# Patient Record
Sex: Male | Born: 1950 | Race: White | Hispanic: No | Marital: Married
Health system: Southern US, Community
[De-identification: ages and names within clinical notes are randomized; demographics above are authoritative.]

## PROBLEM LIST (undated history)

## (undated) DIAGNOSIS — R55 Syncope and collapse: Secondary | ICD-10-CM

## (undated) DIAGNOSIS — C801 Malignant (primary) neoplasm, unspecified: Secondary | ICD-10-CM

## (undated) DIAGNOSIS — W19XXXA Unspecified fall, initial encounter: Secondary | ICD-10-CM

## (undated) DIAGNOSIS — N400 Enlarged prostate without lower urinary tract symptoms: Secondary | ICD-10-CM

## (undated) DIAGNOSIS — R296 Repeated falls: Secondary | ICD-10-CM

## (undated) DIAGNOSIS — E119 Type 2 diabetes mellitus without complications: Secondary | ICD-10-CM

## (undated) DIAGNOSIS — R42 Dizziness and giddiness: Secondary | ICD-10-CM

## (undated) DIAGNOSIS — I2510 Atherosclerotic heart disease of native coronary artery without angina pectoris: Secondary | ICD-10-CM

## (undated) DIAGNOSIS — I2699 Other pulmonary embolism without acute cor pulmonale: Secondary | ICD-10-CM

## (undated) DIAGNOSIS — I82409 Acute embolism and thrombosis of unspecified deep veins of unspecified lower extremity: Secondary | ICD-10-CM

## (undated) HISTORY — DX: Other pulmonary embolism without acute cor pulmonale: I26.99

## (undated) HISTORY — DX: Syncope and collapse: R55

## (undated) HISTORY — DX: Repeated falls: R29.6

## (undated) HISTORY — DX: Atherosclerotic heart disease of native coronary artery without angina pectoris: I25.10

## (undated) HISTORY — DX: Benign prostatic hyperplasia without lower urinary tract symptoms: N40.0

## (undated) HISTORY — DX: Dizziness and giddiness: R42

---

## 2002-09-22 ENCOUNTER — Ambulatory Visit (HOSPITAL_COMMUNITY): Admission: RE | Admit: 2002-09-22 | Discharge: 2002-09-22 | Payer: Self-pay | Admitting: Gastroenterology

## 2002-11-24 ENCOUNTER — Encounter: Payer: Self-pay | Admitting: *Deleted

## 2002-11-24 ENCOUNTER — Ambulatory Visit (HOSPITAL_COMMUNITY): Admission: RE | Admit: 2002-11-24 | Discharge: 2002-11-24 | Payer: Self-pay | Admitting: *Deleted

## 2003-12-31 ENCOUNTER — Emergency Department (HOSPITAL_COMMUNITY): Admission: EM | Admit: 2003-12-31 | Discharge: 2003-12-31 | Payer: Self-pay | Admitting: Emergency Medicine

## 2004-01-31 ENCOUNTER — Emergency Department (HOSPITAL_COMMUNITY): Admission: EM | Admit: 2004-01-31 | Discharge: 2004-01-31 | Payer: Self-pay | Admitting: Emergency Medicine

## 2004-04-30 ENCOUNTER — Emergency Department (HOSPITAL_COMMUNITY): Admission: EM | Admit: 2004-04-30 | Discharge: 2004-04-30 | Payer: Self-pay | Admitting: Family Medicine

## 2004-08-27 ENCOUNTER — Emergency Department (HOSPITAL_COMMUNITY): Admission: EM | Admit: 2004-08-27 | Discharge: 2004-08-27 | Payer: Self-pay | Admitting: Emergency Medicine

## 2015-06-22 ENCOUNTER — Emergency Department (HOSPITAL_COMMUNITY)
Admission: EM | Admit: 2015-06-22 | Discharge: 2015-06-22 | Disposition: A | Discharge status: Home / Self Care | Payer: BLUE CROSS/BLUE SHIELD | Source: Home / Self Care | Attending: Family Medicine | Admitting: Family Medicine

## 2015-06-22 ENCOUNTER — Encounter (HOSPITAL_COMMUNITY): Payer: Self-pay | Admitting: *Deleted

## 2015-06-22 ENCOUNTER — Emergency Department (INDEPENDENT_AMBULATORY_CARE_PROVIDER_SITE_OTHER): Payer: BLUE CROSS/BLUE SHIELD

## 2015-06-22 DIAGNOSIS — S92301A Fracture of unspecified metatarsal bone(s), right foot, initial encounter for closed fracture: Secondary | ICD-10-CM

## 2015-06-22 DIAGNOSIS — S93401A Sprain of unspecified ligament of right ankle, initial encounter: Secondary | ICD-10-CM

## 2015-06-22 HISTORY — DX: Type 2 diabetes mellitus without complications: E11.9

## 2015-06-22 NOTE — ED Notes (Signed)
Pt  Reports   Pain   r      Foot      X  sev  Days  He  States  He  Rolled  It  Several  Days  Ago while going  Down some  Steps               He has  Pain       Bruising  Noted

## 2015-06-22 NOTE — ED Provider Notes (Addendum)
CSN: [id number]     Arrival date & time 06/22/15  1300 History   First MD Initiated Contact with Patient 06/22/15 1341     Chief Complaint  Patient presents with  . Foot Injury   (Consider location/radiation/quality/duration/timing/severity/associated sxs/prior Treatment) Patient is a 64 y.o. male presenting with foot injury. The history is provided by the patient.  Foot Injury Location:  Ankle and foot Time since incident:  3 days Injury: yes   Mechanism of injury: fall   Mechanism of injury comment:  [place] off last step of [place] at home on mon. Fall:    Entrapped after fall: no   Ankle location:  R ankle Foot location:  R foot Pain details:    Severity:  Moderate   Onset quality:  Gradual Chronicity:  New Dislocation: no   Prior injury to area:  No Relieved by:  Nothing Ineffective treatments:  Heat Associated symptoms: decreased ROM and numbness     Past Medical History  Diagnosis Date  . Diabetes mellitus without complication ([place])    History reviewed. No pertinent past surgical history. History reviewed. No pertinent family history. Social History  Substance Use Topics  . Smoking status: Current Every Day Smoker  . Smokeless tobacco: None  . Alcohol Use: No    Review of Systems  Musculoskeletal: Positive for joint swelling and gait problem. Negative for myalgias.  Skin: Negative.   All other systems reviewed and are negative.   Allergies  Review of patient's allergies indicates no known allergies.  Home Medications   Prior to Admission medications   Medication Sig Start Date End Date Taking? Authorizing Provider  Empagliflozin-Linagliptin (GLYXAMBI PO) Take by mouth.   Yes Historical Provider, MD   Meds Ordered and Administered this Visit  Medications - No data to display  BP 152/88 mmHg  Pulse 88  Temp(Src) 98.2 F (36.8 C) (Oral)  Resp 16  SpO2 96% No data found.   Physical Exam  Constitutional: He is oriented to person, place, and time. He  appears well-developed and well-nourished. No distress.  Musculoskeletal: He exhibits tenderness.       Right ankle: He exhibits decreased range of motion, swelling and ecchymosis. He exhibits normal pulse. Tenderness. Lateral malleolus and head of 5th metatarsal tenderness found. No proximal fibula tenderness found. Achilles tendon normal.       Right foot: There is tenderness, bony tenderness and swelling.  Neurological: He is alert and oriented to person, place, and time.  Skin: Skin is warm and dry.  Nursing note and vitals reviewed.   ED Course  Procedures (including critical care time)  Labs Review Labs Reviewed - No data to display  Imaging Review Dg Ankle Complete Right  06/22/2015   CLINICAL DATA:  Acute right ankle pain after injury on steps.  EXAM: RIGHT ANKLE - COMPLETE 3+ VIEW  COMPARISON:  None.  FINDINGS: There is no evidence of fracture, dislocation, or joint effusion. There is no evidence of arthropathy or other focal bone abnormality. Soft tissue swelling is seen over lateral malleolus.  IMPRESSION: No fracture or dislocation is noted. Soft tissue swelling is seen overlying lateral malleolus suggesting ligamentous injury.   Electronically Signed   By: Marijo Conception, M.D.   On: 06/22/2015 14:30   Dg Foot Complete Right  06/22/2015   CLINICAL DATA:  Acute right foot pain and swelling after injury on steps. Initial encounter.  EXAM: RIGHT FOOT COMPLETE - 3+ VIEW  COMPARISON:  November 07, 2012.  FINDINGS: Nondisplaced  fracture is seen involving the proximal base of the fifth metatarsal. This appears to be closed and posttraumatic. Spurring of posterior calcaneus is noted. 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  IMPRESSION: Nondisplaced fracture involving the proximal fifth metatarsal.   Electronically Signed   By: Marijo Conception, M.D.   On: 06/22/2015 14:25    X-rays reviewed and report per radiologist.  Visual Acuity  Review  Right Eye Distance:   Left Eye Distance:   Bilateral Distance:    Right Eye Near:   Left Eye Near:    Bilateral Near:         MDM   1. Sprain of ankle, right, initial encounter   2. Metatarsal fracture, right, closed, initial encounter    Cam walker    Billy Fischer, MD 06/22/15 [place], MD 06/22/15 [phone]

## 2015-06-22 NOTE — Discharge Instructions (Signed)
Ice, elevate and ibuprofen as needed,minimize activity as possible.

## 2015-07-13 NOTE — ED Notes (Signed)
Dr  Juventino Slovak  Notified  Of   Request    For  Work  Excuse      Gave  Instructions   For  Pt  To  Be excused     From  The  5t  Of  Oct  Until  13  Oct  But   Advised   fmla     Papers     Should  Be  Filled  Out  By  The  Orthopedist

## 2017-03-06 DIAGNOSIS — E1140 Type 2 diabetes mellitus with diabetic neuropathy, unspecified: Secondary | ICD-10-CM | POA: Diagnosis not present

## 2017-03-06 DIAGNOSIS — Z1389 Encounter for screening for other disorder: Secondary | ICD-10-CM | POA: Diagnosis not present

## 2017-03-06 DIAGNOSIS — E784 Other hyperlipidemia: Secondary | ICD-10-CM | POA: Diagnosis not present

## 2017-03-06 DIAGNOSIS — Z6823 Body mass index (BMI) 23.0-23.9, adult: Secondary | ICD-10-CM | POA: Diagnosis not present

## 2017-03-06 DIAGNOSIS — E1142 Type 2 diabetes mellitus with diabetic polyneuropathy: Secondary | ICD-10-CM | POA: Diagnosis not present

## 2017-07-10 DIAGNOSIS — E1142 Type 2 diabetes mellitus with diabetic polyneuropathy: Secondary | ICD-10-CM | POA: Diagnosis not present

## 2017-07-10 DIAGNOSIS — R008 Other abnormalities of heart beat: Secondary | ICD-10-CM | POA: Diagnosis not present

## 2017-07-10 DIAGNOSIS — E1149 Type 2 diabetes mellitus with other diabetic neurological complication: Secondary | ICD-10-CM | POA: Diagnosis not present

## 2017-07-10 DIAGNOSIS — Z6824 Body mass index (BMI) 24.0-24.9, adult: Secondary | ICD-10-CM | POA: Diagnosis not present

## 2017-07-10 DIAGNOSIS — E7849 Other hyperlipidemia: Secondary | ICD-10-CM | POA: Diagnosis not present

## 2017-07-12 DIAGNOSIS — Z23 Encounter for immunization: Secondary | ICD-10-CM | POA: Diagnosis not present

## 2018-01-08 DIAGNOSIS — E7849 Other hyperlipidemia: Secondary | ICD-10-CM | POA: Diagnosis not present

## 2018-01-08 DIAGNOSIS — E1142 Type 2 diabetes mellitus with diabetic polyneuropathy: Secondary | ICD-10-CM | POA: Diagnosis not present

## 2018-01-08 DIAGNOSIS — E1149 Type 2 diabetes mellitus with other diabetic neurological complication: Secondary | ICD-10-CM | POA: Diagnosis not present

## 2018-01-08 DIAGNOSIS — Z6823 Body mass index (BMI) 23.0-23.9, adult: Secondary | ICD-10-CM | POA: Diagnosis not present

## 2018-01-08 DIAGNOSIS — E1140 Type 2 diabetes mellitus with diabetic neuropathy, unspecified: Secondary | ICD-10-CM | POA: Diagnosis not present

## 2018-03-17 ENCOUNTER — Other Ambulatory Visit: Payer: Self-pay

## 2018-03-17 ENCOUNTER — Emergency Department (HOSPITAL_COMMUNITY)
Admission: EM | Admit: 2018-03-17 | Discharge: 2018-03-18 | Disposition: A | Discharge status: Home / Self Care | Payer: Medicare HMO | Attending: Emergency Medicine | Admitting: Emergency Medicine

## 2018-03-17 DIAGNOSIS — M7989 Other specified soft tissue disorders: Secondary | ICD-10-CM | POA: Diagnosis not present

## 2018-03-17 DIAGNOSIS — E119 Type 2 diabetes mellitus without complications: Secondary | ICD-10-CM | POA: Diagnosis not present

## 2018-03-17 DIAGNOSIS — Z7984 Long term (current) use of oral hypoglycemic drugs: Secondary | ICD-10-CM | POA: Diagnosis not present

## 2018-03-17 DIAGNOSIS — F17210 Nicotine dependence, cigarettes, uncomplicated: Secondary | ICD-10-CM | POA: Diagnosis not present

## 2018-03-17 DIAGNOSIS — I824Y1 Acute embolism and thrombosis of unspecified deep veins of right proximal lower extremity: Secondary | ICD-10-CM | POA: Diagnosis not present

## 2018-03-17 LAB — BASIC METABOLIC PANEL
Anion gap: 10 (ref 5–15)
BUN: 22 mg/dL (ref 8–23)
CO2: 27 mmol/L (ref 22–32)
Calcium: 9.5 mg/dL (ref 8.9–10.3)
Chloride: 100 mmol/L (ref 98–111)
Creatinine, Ser: 1.14 mg/dL (ref 0.61–1.24)
GFR calc Af Amer: >60 mL/min (ref >60)
GFR calc non Af Amer: >60 mL/min (ref >60)
Glucose, Bld: 213 mg/dL — ABNORMAL HIGH (ref 70–99)
Potassium: 4.4 mmol/L (ref 3.5–5.1)
Sodium: 137 mmol/L (ref 135–145)

## 2018-03-17 LAB — CBC WITH DIFFERENTIAL/PLATELET
Abs Immature Granulocytes: 0 10*3/uL (ref 0.0–0.1)
Basophils Absolute: 0.1 10*3/uL (ref 0.0–0.1)
Basophils Relative: 1 %
Eosinophils Absolute: 0.3 10*3/uL (ref 0.0–0.7)
Eosinophils Relative: 3 %
HCT: 39.7 % (ref 39.0–52.0)
Hemoglobin: 12.8 g/dL — ABNORMAL LOW (ref 13.0–17.0)
Immature Granulocytes: 0 %
Lymphocytes Relative: 23 %
Lymphs Abs: 2.2 10*3/uL (ref 0.7–4.0)
MCH: 31.1 pg (ref 26.0–34.0)
MCHC: 32.2 g/dL (ref 30.0–36.0)
MCV: 96.6 fL (ref 78.0–100.0)
Monocytes Absolute: 0.6 10*3/uL (ref 0.1–1.0)
Monocytes Relative: 6 %
Neutro Abs: 6.3 10*3/uL (ref 1.7–7.7)
Neutrophils Relative %: 67 %
Platelets: 168 10*3/uL (ref 150–400)
RBC: 4.11 MIL/uL — ABNORMAL LOW (ref 4.22–5.81)
RDW: 12.4 % (ref 11.5–15.5)
WBC: 9.5 10*3/uL (ref 4.0–10.5)

## 2018-03-17 NOTE — ED Triage Notes (Signed)
Pt c/o RLE and foot swelling two weeks ago.

## 2018-03-18 ENCOUNTER — Emergency Department (HOSPITAL_COMMUNITY)
Admission: EM | Admit: 2018-03-18 | Discharge: 2018-03-18 | Disposition: A | Discharge status: Home / Self Care | Payer: Medicare HMO | Source: Home / Self Care | Attending: Emergency Medicine | Admitting: Emergency Medicine

## 2018-03-18 ENCOUNTER — Encounter (HOSPITAL_COMMUNITY): Payer: Self-pay | Admitting: *Deleted

## 2018-03-18 ENCOUNTER — Ambulatory Visit (HOSPITAL_BASED_OUTPATIENT_CLINIC_OR_DEPARTMENT_OTHER)
Admission: RE | Admit: 2018-03-18 | Discharge: 2018-03-18 | Disposition: A | Discharge status: Home / Self Care | Payer: Medicare HMO | Source: Ambulatory Visit | Attending: Emergency Medicine | Admitting: Emergency Medicine

## 2018-03-18 ENCOUNTER — Other Ambulatory Visit: Payer: Self-pay

## 2018-03-18 DIAGNOSIS — I82431 Acute embolism and thrombosis of right popliteal vein: Secondary | ICD-10-CM

## 2018-03-18 DIAGNOSIS — E119 Type 2 diabetes mellitus without complications: Secondary | ICD-10-CM | POA: Insufficient documentation

## 2018-03-18 DIAGNOSIS — I82411 Acute embolism and thrombosis of right femoral vein: Secondary | ICD-10-CM

## 2018-03-18 DIAGNOSIS — M7989 Other specified soft tissue disorders: Secondary | ICD-10-CM

## 2018-03-18 DIAGNOSIS — I825Y1 Chronic embolism and thrombosis of unspecified deep veins of right proximal lower extremity: Secondary | ICD-10-CM | POA: Insufficient documentation

## 2018-03-18 DIAGNOSIS — I82441 Acute embolism and thrombosis of right tibial vein: Secondary | ICD-10-CM | POA: Insufficient documentation

## 2018-03-18 DIAGNOSIS — F17210 Nicotine dependence, cigarettes, uncomplicated: Secondary | ICD-10-CM | POA: Insufficient documentation

## 2018-03-18 DIAGNOSIS — I824Y1 Acute embolism and thrombosis of unspecified deep veins of right proximal lower extremity: Secondary | ICD-10-CM | POA: Diagnosis not present

## 2018-03-18 LAB — CBG MONITORING, ED: Glucose-Capillary: 200 mg/dL — ABNORMAL HIGH (ref 70–99)

## 2018-03-18 MED ORDER — ENOXAPARIN SODIUM 150 MG/ML ~~LOC~~ SOLN
1.5000 mg/kg | Freq: Once | SUBCUTANEOUS | Status: AC
  Administered 2018-03-18: 135 mg via SUBCUTANEOUS
  Filled 2018-03-18: qty 0.91

## 2018-03-18 MED ORDER — DOXYCYCLINE HYCLATE 100 MG PO CAPS: 100.0000 mg | ORAL_CAPSULE | Freq: Two times a day (BID) | ORAL | 0 refills | Status: DC

## 2018-03-18 MED ORDER — DOXYCYCLINE HYCLATE 100 MG PO TABS
100.0000 mg | ORAL_TABLET | Freq: Once | ORAL | Status: AC
  Administered 2018-03-18: 100 mg via ORAL
  Filled 2018-03-18: qty 1

## 2018-03-18 MED ORDER — RIVAROXABAN 15 MG PO TABS
15.0000 mg | ORAL_TABLET | Freq: Once | ORAL | Status: AC
  Administered 2018-03-18: 15 mg via ORAL
  Filled 2018-03-18: qty 1

## 2018-03-18 MED ORDER — RIVAROXABAN (XARELTO) VTE STARTER PACK (15 & 20 MG): ORAL_TABLET | ORAL | 0 refills | Status: DC

## 2018-03-18 NOTE — ED Triage Notes (Signed)
PT reports a couple weeks ago he hurt his Rt ankle unsure how injury occurred. Pt reports he now has pain in RFtcalf that hurts worse when walking.

## 2018-03-18 NOTE — Discharge Instructions (Signed)
Take the medications as prescribed (you will end up needing to switch from twice a day to once a day, this will be written on the prescription), follow-up with your primary care doctor in the next week or so, return for signs of bleeding or other complications

## 2018-03-18 NOTE — ED Provider Notes (Signed)
[facility] EMERGENCY DEPARTMENT Provider Note   CSN: [id number] Arrival date & time: 03/17/18  2115    History   Chief Complaint Chief Complaint  Patient presents with  . Leg Swelling    R leg only    HPI Micheal Moore. is a 67 y.o. male.  67 year old male with history of diabetes presents to the emergency department for complaints of right lower extremity swelling.  Triage note reports swelling onset 2 weeks ago, but patient states this actually occurred more recently.  Swelling has been gradual, but worsening since yesterday.  He denies any known trauma or injury to his leg.  No prior history of DVT.  He has 2 abrasions to the top of his shin, but states these were sustained after the swelling began.  He has not had any fevers, chest pain, shortness of breath, hemoptysis.  No recent surgeries or hospitalizations.  He denies taking any medications for his symptoms.  Symptoms with a slight discomfort just below his medial right knee, mostly with ambulation.     Past Medical History:  Diagnosis Date  . Diabetes mellitus without complication ([place])     There are no active problems to display for this patient.   No past surgical history on file.      Home Medications    Prior to Admission medications   Medication Sig Start Date End Date Taking? Authorizing Provider  doxycycline (VIBRAMYCIN) 100 MG capsule Take 1 capsule (100 mg total) by mouth 2 (two) times daily. 03/18/18   Antonietta Breach, PA-C  Empagliflozin-Linagliptin (GLYXAMBI PO) Take by mouth.    [provider]    Family History No family history on file.  Social History Social History   Tobacco Use  . Smoking status: Current Every Day Smoker  Substance Use Topics  . Alcohol use: No  . Drug use: Not on file     Allergies   Patient has no known allergies.   Review of Systems Review of Systems Ten systems reviewed and are negative for acute change, except as noted in  the HPI.    Physical Exam Updated Vital Signs BP (!) 157/90 (BP Location: Right Arm)   Pulse 97   Temp 98.6 F (37 C) (Oral)   Resp 12   Ht 6\' 5"  (1.956 m)   Wt 90.7 kg (200 lb)   SpO2 100%   BMI 23.72 kg/m   Physical Exam  Constitutional: He is oriented to person, place, and time. He appears well-developed and well-nourished. No distress.  Nontoxic appearing and in NAD  HENT:  Head: Normocephalic and atraumatic.  Eyes: Conjunctivae and EOM are normal. No scleral icterus.  Neck: Normal range of motion.  Cardiovascular: Normal rate, regular rhythm and intact distal pulses.  DP pulse 2+ in the RLE  Pulmonary/Chest: Effort normal. No respiratory distress.  Respirations even and unlabored  Musculoskeletal: Normal range of motion.  Swelling to the RLE compared to left; increasingly erythematous with mild heat to touch. Two abrasion overlying R shin. Compartments soft. There is TTP to the medial aspect of the RLE, just distal to the R knee joint. No palpable cords. No lymphangitic streaking.  Normal ROM of the RLE.  Neurological: He is alert and oriented to person, place, and time. He exhibits normal muscle tone. Coordination normal.  Skin: Skin is warm and dry. No rash noted. He is not diaphoretic. No pallor.  Psychiatric: He has a normal mood and affect. His behavior is normal.  Nursing note and vitals reviewed.        ED Treatments / Results  Labs (all labs ordered are listed, but only abnormal results are displayed) Labs Reviewed  CBC WITH DIFFERENTIAL/PLATELET - Abnormal; Notable for the following components:      Result Value   RBC 4.11 (*)    Hemoglobin 12.8 (*)    All other components within normal limits  BASIC METABOLIC PANEL - Abnormal; Notable for the following components:   Glucose, Bld 213 (*)    All other components within normal limits    EKG None  Radiology No results found.  Procedures Procedures (including critical care time)  Medications  Ordered in ED Medications  enoxaparin (LOVENOX) injection 135 mg (135 mg Subcutaneous Given 03/18/18 0118)  doxycycline (VIBRA-TABS) tablet 100 mg (100 mg Oral Given 03/18/18 0053)     Initial Impression / Assessment and Plan / ED Course  I have reviewed the triage vital signs and the nursing notes.  Pertinent labs & imaging results that were available during my care of the patient were reviewed by me and considered in my medical decision making (see chart for details).     67 year old male presents for swelling to the right lower extremity.  There is concern for DVT versus cellulitis.  The patient is neurovascularly intact.  He has no leukocytosis or fever.  No lymphangitic streaking.  Cellulitis thought less likely, but will start on doxycycline and provide prescription for antibiotic course should venous duplex not reveal DVT.  The patient has been given a dose of Lovenox in the ED.  Patient instructed to return for ultrasound in the morning.  Compartments are soft and no history of trauma.  He has been weightbearing without difficulty.  No concern for fracture.  Return precautions discussed and provided. Patient discharged in stable condition with no unaddressed concerns.   Final Clinical Impressions(s) / ED Diagnoses   Final diagnoses:  Swelling of right lower extremity    ED Discharge Orders        Ordered    doxycycline (VIBRAMYCIN) 100 MG capsule  2 times daily     03/18/18 0107    LE VENOUS     03/18/18 0107       Antonietta Breach, PA-C 03/18/18 0422    Merryl Hacker, MD 03/19/18 [phone]

## 2018-03-18 NOTE — Progress Notes (Signed)
Right lower extremity venous duplex completed. Positive for and extensive acute DVT coursing from the posterior tibial vein through the popliteal and femoral veins. There is no evidence of a baker's cyst. Toma Copier, RVS 03/18/2018 9:03 AM

## 2018-03-18 NOTE — Discharge Instructions (Signed)
Return in the morning to have an ultrasound of your leg completed to evaluate for blood clot.  If you have a blood clot in your leg, you will need to be started on a blood thinner.  If your ultrasound is negative, your swelling is likely from infection.  If you do not have a blood clot in your leg, start taking doxycycline.  Take this medication as prescribed until finished.  Continue to elevate your leg as much as possible, specifically over the level of your heart.  Follow-up with your primary care doctor to ensure resolution of symptoms.  Return if symptoms persist or worsen.

## 2018-03-18 NOTE — ED Notes (Signed)
Discharge instructions and prescriptions discussed with Pt. Pt verbalized understanding. Pt stable and ambulatory.   

## 2018-03-18 NOTE — ED Provider Notes (Signed)
[facility] EMERGENCY DEPARTMENT Provider Note   CSN: [id number] Arrival date & time: 03/18/18  1779     History   Chief Complaint Chief Complaint  Patient presents with  . Leg Pain    HPI Micheal Moore. is a 67 y.o. male.  HPI Pt has been having pain in his calf for at least the past week.  He was seen in the ED last night and scheduled for an [place] this am.   Pt had his [place] and was sent back to the ED because it was positive. He denies any chest pain or shortness of breath.  No bleeding problems.  No other complaints or concerns. Past Medical History:  Diagnosis Date  . Diabetes mellitus without complication ([place])     There are no active problems to display for this patient.   History reviewed. No pertinent surgical history.      Home Medications    Prior to Admission medications   Medication Sig Start Date End Date Taking? Authorizing Provider  doxycycline (VIBRAMYCIN) 100 MG capsule Take 1 capsule (100 mg total) by mouth 2 (two) times daily. 03/18/18   Antonietta Breach, PA-C  Empagliflozin-Linagliptin (GLYXAMBI PO) Take by mouth.    [provider]  Rivaroxaban 15 & 20 MG TBPK Take as directed on package: Start with one 15mg  tablet by mouth twice a day with food. On Day 22, switch to one 20mg  tablet once a day with food. 03/18/18   Dorie Rank, MD    Family History History reviewed. No pertinent family history.  Social History Social History   Tobacco Use  . Smoking status: Current Every Day Smoker  . Smokeless tobacco: Never Used  Substance Use Topics  . Alcohol use: No  . Drug use: Not on file     Allergies   Patient has no known allergies.   Review of Systems Review of Systems  All other systems reviewed and are negative.    Physical Exam Updated Vital Signs BP (!) 154/85   Pulse (!) 101   Temp 98.8 F (37.1 C) (Oral)   Resp 16   Ht 1.956 m (6\' 5" )   Wt 90.7 kg (200 lb)   SpO2 96%   BMI 23.72 kg/m    Physical Exam  Constitutional: He appears well-developed and well-nourished. No distress.  HENT:  Head: Normocephalic and atraumatic.  Right Ear: External ear normal.  Left Ear: External ear normal.  Eyes: Conjunctivae are normal. Right eye exhibits no discharge. Left eye exhibits no discharge. No scleral icterus.  Neck: Neck supple. No tracheal deviation present.  Cardiovascular: Normal rate.  Pulmonary/Chest: Effort normal. No stridor. No respiratory distress.  Abdominal: He exhibits no distension.  Musculoskeletal: He exhibits edema and tenderness.  Erythema and edema of the right lower extremity  Neurological: He is alert. Cranial nerve deficit: no gross deficits.  Skin: Skin is warm and dry. No rash noted.  Psychiatric: He has a normal mood and affect.  Nursing note and vitals reviewed.    ED Treatments / Results  Labs (all labs ordered are listed, but only abnormal results are displayed) Labs Reviewed  CBG MONITORING, ED - Abnormal; Notable for the following components:      Result Value   Glucose-Capillary 200 (*)    All other components within normal limits     Procedures Procedures (including critical care time)  Medications Ordered in ED Medications  Rivaroxaban (XARELTO) tablet 15 mg (has no administration in time range)  Initial Impression / Assessment and Plan / ED Course  I have reviewed the triage vital signs and the nursing notes.  Pertinent labs & imaging results that were available during my care of the patient were reviewed by me and considered in my medical decision making (see chart for details).   Renal function checked last night.  Cr is 1.14.  CBC normal.    Patient presented to the emergency room for treatment of a DVT noted on an outpatient ultrasound ordered by ED physicians last evening.  Patient's ultrasound is positive for an acute DVT.  Patient denies any trouble with chest pain or shortness of breath.  His renal function is normal.   We discussed outpatient treatment with Xarelto and the need to follow-up with his primary care doctor.  No clear indication of why the patient sustained a DVT although he does have a history of a prior injury to that leg.  Final Clinical Impressions(s) / ED Diagnoses   Final diagnoses:  Acute deep vein thrombosis (DVT) of proximal vein of right lower extremity [facility])    ED Discharge Orders        Ordered    Rivaroxaban 15 & 20 MG TBPK     03/18/18 0945       Dorie Rank, MD 03/18/18 0945

## 2018-03-18 NOTE — ED Notes (Addendum)
Pt reports stabbing pain behind right knee when walking that subsides when sitting or lying down. Denies SOB.

## 2018-03-18 NOTE — ED Notes (Signed)
Pt's cbg was 200, informed Rod Holler- Therapist, sports.

## 2018-03-26 DIAGNOSIS — E1140 Type 2 diabetes mellitus with diabetic neuropathy, unspecified: Secondary | ICD-10-CM | POA: Diagnosis not present

## 2018-03-26 DIAGNOSIS — I82401 Acute embolism and thrombosis of unspecified deep veins of right lower extremity: Secondary | ICD-10-CM | POA: Diagnosis not present

## 2018-03-26 DIAGNOSIS — Z6823 Body mass index (BMI) 23.0-23.9, adult: Secondary | ICD-10-CM | POA: Diagnosis not present

## 2018-06-04 DIAGNOSIS — H52209 Unspecified astigmatism, unspecified eye: Secondary | ICD-10-CM | POA: Diagnosis not present

## 2018-06-04 DIAGNOSIS — H524 Presbyopia: Secondary | ICD-10-CM | POA: Diagnosis not present

## 2018-06-04 DIAGNOSIS — H5213 Myopia, bilateral: Secondary | ICD-10-CM | POA: Diagnosis not present

## 2018-08-12 DIAGNOSIS — E1142 Type 2 diabetes mellitus with diabetic polyneuropathy: Secondary | ICD-10-CM | POA: Diagnosis not present

## 2018-08-12 DIAGNOSIS — L03116 Cellulitis of left lower limb: Secondary | ICD-10-CM | POA: Diagnosis not present

## 2018-08-12 DIAGNOSIS — E7849 Other hyperlipidemia: Secondary | ICD-10-CM | POA: Diagnosis not present

## 2018-08-12 DIAGNOSIS — I87009 Postthrombotic syndrome without complications of unspecified extremity: Secondary | ICD-10-CM | POA: Diagnosis not present

## 2018-08-12 DIAGNOSIS — E1149 Type 2 diabetes mellitus with other diabetic neurological complication: Secondary | ICD-10-CM | POA: Diagnosis not present

## 2018-08-12 DIAGNOSIS — E1140 Type 2 diabetes mellitus with diabetic neuropathy, unspecified: Secondary | ICD-10-CM | POA: Diagnosis not present

## 2018-08-12 DIAGNOSIS — Z6824 Body mass index (BMI) 24.0-24.9, adult: Secondary | ICD-10-CM | POA: Diagnosis not present

## 2018-08-12 DIAGNOSIS — Z23 Encounter for immunization: Secondary | ICD-10-CM | POA: Diagnosis not present

## 2018-12-18 DIAGNOSIS — E1149 Type 2 diabetes mellitus with other diabetic neurological complication: Secondary | ICD-10-CM | POA: Diagnosis not present

## 2018-12-18 DIAGNOSIS — E1142 Type 2 diabetes mellitus with diabetic polyneuropathy: Secondary | ICD-10-CM | POA: Diagnosis not present

## 2018-12-18 DIAGNOSIS — I82401 Acute embolism and thrombosis of unspecified deep veins of right lower extremity: Secondary | ICD-10-CM | POA: Diagnosis not present

## 2018-12-18 DIAGNOSIS — E785 Hyperlipidemia, unspecified: Secondary | ICD-10-CM | POA: Diagnosis not present

## 2019-03-22 ENCOUNTER — Other Ambulatory Visit: Payer: Self-pay

## 2019-03-22 ENCOUNTER — Emergency Department (HOSPITAL_COMMUNITY)
Admission: EM | Admit: 2019-03-22 | Discharge: 2019-03-23 | Disposition: A | Discharge status: Home / Self Care | Payer: Medicare HMO | Attending: Emergency Medicine | Admitting: Emergency Medicine

## 2019-03-22 ENCOUNTER — Emergency Department (HOSPITAL_COMMUNITY): Payer: Medicare HMO

## 2019-03-22 ENCOUNTER — Encounter (HOSPITAL_COMMUNITY): Payer: Self-pay | Admitting: *Deleted

## 2019-03-22 DIAGNOSIS — R410 Disorientation, unspecified: Secondary | ICD-10-CM | POA: Diagnosis not present

## 2019-03-22 DIAGNOSIS — E860 Dehydration: Secondary | ICD-10-CM | POA: Insufficient documentation

## 2019-03-22 DIAGNOSIS — Z79899 Other long term (current) drug therapy: Secondary | ICD-10-CM | POA: Diagnosis not present

## 2019-03-22 DIAGNOSIS — S59902A Unspecified injury of left elbow, initial encounter: Secondary | ICD-10-CM | POA: Diagnosis not present

## 2019-03-22 DIAGNOSIS — Z7984 Long term (current) use of oral hypoglycemic drugs: Secondary | ICD-10-CM | POA: Insufficient documentation

## 2019-03-22 DIAGNOSIS — S40812A Abrasion of left upper arm, initial encounter: Secondary | ICD-10-CM | POA: Diagnosis not present

## 2019-03-22 DIAGNOSIS — W1830XA Fall on same level, unspecified, initial encounter: Secondary | ICD-10-CM | POA: Insufficient documentation

## 2019-03-22 DIAGNOSIS — F17210 Nicotine dependence, cigarettes, uncomplicated: Secondary | ICD-10-CM | POA: Insufficient documentation

## 2019-03-22 DIAGNOSIS — Y9389 Activity, other specified: Secondary | ICD-10-CM | POA: Insufficient documentation

## 2019-03-22 DIAGNOSIS — S299XXA Unspecified injury of thorax, initial encounter: Secondary | ICD-10-CM | POA: Diagnosis not present

## 2019-03-22 DIAGNOSIS — Y92410 Unspecified street and highway as the place of occurrence of the external cause: Secondary | ICD-10-CM | POA: Diagnosis not present

## 2019-03-22 DIAGNOSIS — R000 Tachycardia, unspecified: Secondary | ICD-10-CM | POA: Diagnosis not present

## 2019-03-22 DIAGNOSIS — E119 Type 2 diabetes mellitus without complications: Secondary | ICD-10-CM | POA: Diagnosis not present

## 2019-03-22 DIAGNOSIS — Z23 Encounter for immunization: Secondary | ICD-10-CM | POA: Diagnosis not present

## 2019-03-22 DIAGNOSIS — R42 Dizziness and giddiness: Secondary | ICD-10-CM | POA: Diagnosis not present

## 2019-03-22 DIAGNOSIS — S0003XA Contusion of scalp, initial encounter: Secondary | ICD-10-CM | POA: Diagnosis not present

## 2019-03-22 DIAGNOSIS — R55 Syncope and collapse: Secondary | ICD-10-CM | POA: Insufficient documentation

## 2019-03-22 DIAGNOSIS — E1165 Type 2 diabetes mellitus with hyperglycemia: Secondary | ICD-10-CM | POA: Diagnosis not present

## 2019-03-22 DIAGNOSIS — Y999 Unspecified external cause status: Secondary | ICD-10-CM | POA: Diagnosis not present

## 2019-03-22 DIAGNOSIS — R0902 Hypoxemia: Secondary | ICD-10-CM | POA: Diagnosis not present

## 2019-03-22 LAB — COMPREHENSIVE METABOLIC PANEL
ALT: 16 U/L (ref 0–44)
AST: 23 U/L (ref 15–41)
Albumin: 4.2 g/dL (ref 3.5–5.0)
Alkaline Phosphatase: 63 U/L (ref 38–126)
Anion gap: 14 (ref 5–15)
BUN: 21 mg/dL (ref 8–23)
CO2: 16 mmol/L — ABNORMAL LOW (ref 22–32)
Calcium: 9.7 mg/dL (ref 8.9–10.3)
Chloride: 107 mmol/L (ref 98–111)
Creatinine, Ser: 1.7 mg/dL — ABNORMAL HIGH (ref 0.61–1.24)
GFR calc Af Amer: 47 mL/min — ABNORMAL LOW (ref >60)
GFR calc non Af Amer: 41 mL/min — ABNORMAL LOW (ref >60)
Glucose, Bld: 293 mg/dL — ABNORMAL HIGH (ref 70–99)
Potassium: 5 mmol/L (ref 3.5–5.1)
Sodium: 137 mmol/L (ref 135–145)
Total Bilirubin: 1.1 mg/dL (ref 0.3–1.2)
Total Protein: 7.4 g/dL (ref 6.5–8.1)

## 2019-03-22 LAB — CBC WITH DIFFERENTIAL/PLATELET
Abs Immature Granulocytes: 0.04 10*3/uL (ref 0.00–0.07)
Basophils Absolute: 0.1 10*3/uL (ref 0.0–0.1)
Basophils Relative: 1 %
Eosinophils Absolute: 0.1 10*3/uL (ref 0.0–0.5)
Eosinophils Relative: 1 %
HCT: 42.5 % (ref 39.0–52.0)
Hemoglobin: 14.3 g/dL (ref 13.0–17.0)
Immature Granulocytes: 1 %
Lymphocytes Relative: 22 %
Lymphs Abs: 1.6 10*3/uL (ref 0.7–4.0)
MCH: 32 pg (ref 26.0–34.0)
MCHC: 33.6 g/dL (ref 30.0–36.0)
MCV: 95.1 fL (ref 80.0–100.0)
Monocytes Absolute: 0.3 10*3/uL (ref 0.1–1.0)
Monocytes Relative: 4 %
Neutro Abs: 5 10*3/uL (ref 1.7–7.7)
Neutrophils Relative %: 71 %
Platelets: 239 10*3/uL (ref 150–400)
RBC: 4.47 MIL/uL (ref 4.22–5.81)
RDW: 12.7 % (ref 11.5–15.5)
WBC: 7.1 10*3/uL (ref 4.0–10.5)
nRBC: 0 % (ref 0.0–0.2)

## 2019-03-22 LAB — D-DIMER, QUANTITATIVE: D-Dimer, Quant: 0.64 ug/mL-FEU — ABNORMAL HIGH (ref 0.00–0.50)

## 2019-03-22 LAB — CK: Total CK: 613 U/L — ABNORMAL HIGH (ref 49–397)

## 2019-03-22 LAB — LACTIC ACID, PLASMA
Lactic Acid, Venous: 4.9 mmol/L (ref 0.5–1.9)
Lactic Acid, Venous: 1.6 mmol/L (ref 0.5–1.9)

## 2019-03-22 LAB — CBG MONITORING, ED: Glucose-Capillary: 269 mg/dL — ABNORMAL HIGH (ref 70–99)

## 2019-03-22 MED ORDER — SODIUM CHLORIDE 0.9 % IV BOLUS
1000.0000 mL | Freq: Once | INTRAVENOUS | Status: AC
  Administered 2019-03-22: 1000 mL via INTRAVENOUS

## 2019-03-22 MED ORDER — TETANUS-DIPHTH-ACELL PERTUSSIS 5-2.5-18.5 LF-MCG/0.5 IM SUSP
0.5000 mL | Freq: Once | INTRAMUSCULAR | Status: AC
  Administered 2019-03-22: 0.5 mL via INTRAMUSCULAR
  Filled 2019-03-22: qty 0.5

## 2019-03-22 NOTE — Discharge Instructions (Addendum)
You were seen in the ER for near passing out episode  We suspect your symptoms are from exposure to heat, dehydration, not eating.   We gave you IV fluids and you felt better  Stay hydrated. Eat small healthy meals every 3-4 hours.   Return immediately to ER for light headedness, feeling faint, passing out, chest pain, shortness of breath, palpitations

## 2019-03-22 NOTE — ED Provider Notes (Signed)
[facility] EMERGENCY DEPARTMENT Provider Note   CSN: [id number] Arrival date & time: 03/22/19  1718    History   Chief Complaint Chief Complaint  Patient presents with   Fall    HPI Micheal Moore. is a 68 y.o. male with history of diabetes on oral agents, DVT, tobacco use brought to the ER by EMS for evaluation after a fall.  Per EMS report patient was found sitting down on the ground and he reported feeling dizzy.  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  Usually he does it in the morning but today he started at 3 pm. He had been doing this in the heat for approximately 1.5 hour.  Remembers prodromal fatigue, generalized weakness and remembers "going down" falling to the ground.  He braced his fall with his left upper extremity.  There was left forehead trauma with an abrasion.  On route to the ER he vomited once.  He denies LOC.  He denies any headache, vision changes or loss, neck pain.  He denies anticoagulant use.  Currently reports mild intermittent dizziness, feeling tired, hot.  He drank some water but cannot tell me how much before starting work.  He has not eaten anything today.  Temperature 100.3 and pulse rate 120 per EMS.  No interventions.  No modifying factors. States he didn't finish Xarelto 1 year ago after being diagnosed with DVT because of cost, now just taking aspirin.   He denies any recent infectious illnesses.  He denies any recent fevers, congestion, rhinorrhea, sore throat, cough, abdominal pain, diarrhea.  No dysuria.  He denies any prodromal chest pain, palpitations prior to the fall.  He denies any illicit drug use or EtOH use.       HPI  Past Medical History:  Diagnosis Date   Diabetes mellitus without complication ([place])     There are no active problems to display for this patient.   History reviewed. No pertinent surgical history.      Home Medications    Prior to Admission  medications   Medication Sig Start Date End Date Taking? Authorizing Provider  aspirin 325 MG tablet Take 325 mg by mouth at bedtime.   Yes [provider]  glimepiride (AMARYL) 2 MG tablet Take 2 mg by mouth at bedtime.  01/26/19  Yes [provider]  metFORMIN (GLUCOPHAGE) 500 MG tablet Take 500 mg by mouth 2 (two) times daily. 02/13/19  Yes [provider]  Rivaroxaban 15 & 20 MG TBPK Take as directed on package: Start with one 15mg  tablet by mouth twice a day with food. On Day 22, switch to one 20mg  tablet once a day with food. Patient not taking: Reported on 03/22/2019 03/18/18   Dorie Rank, MD    Family History No family history on file.  Social History Social History   Tobacco Use   Smoking status: Current Every Day Smoker   Smokeless tobacco: Never Used  Substance Use Topics   Alcohol use: No   Drug use: Not on file     Allergies   Patient has no known allergies.   Review of Systems Review of Systems  Constitutional: Positive for fatigue.  Gastrointestinal: Positive for nausea and vomiting (x1 ).  Skin: Positive for wound.  Neurological: Positive for dizziness and light-headedness.  All other systems reviewed and are negative.    Physical Exam Updated Vital Signs BP (!) 133/96    Pulse 91  Temp 97.9 F (36.6 C) (Rectal)    Resp 14    Ht 6\' 5"  (1.956 m)    Wt 99.8 kg    SpO2 98%    BMI 26.09 kg/m   Physical Exam Vitals signs and nursing note reviewed.  Constitutional:      Appearance: He is well-developed. He is diaphoretic.     Comments: Non toxic, looks tired.  HENT:     Head: Normocephalic.     Comments: Small left forehead abrasion without local tenderness, crepitus, contusion.  No other scalp, nasal, maxillary, mandibular tenderness or deformity.    Ears:     Comments: No hemotympanum.  No battle signs.    Nose: Nose normal.     Mouth/Throat:     Mouth: Mucous membranes are dry.     Comments: Dry lips but MMM.  Poor  dentition.  White emesis noted around his beard. Eyes:     Conjunctiva/sclera: Conjunctivae normal.     Comments: No raccoon's eyes.  No periorbital bone tenderness  Neck:     Musculoskeletal: Normal range of motion.     Comments: Arrives in cervical collar.  No midline or paraspinal muscle tenderness.  Full range of motion of the neck without pain.  Trachea midline.  No obvious JVD. Cardiovascular:     Rate and Rhythm: Regular rhythm. Tachycardia present.     Heart sounds: Normal heart sounds.     Comments: HR 1 15-1 25.  NSR on cardiac monitor.  1+ radial and DP pulses bilaterally.  No asymmetric lower extremity edema.  No calf tenderness. Pulmonary:     Effort: Pulmonary effort is normal.     Breath sounds: Wheezing and rhonchi present.     Comments: End expiratory subtle wheezing/rhonchi to the left middle/lower lobes.  Lungs clear otherwise.  Normal work of breathing.  No chest wall tenderness. Abdominal:     General: Bowel sounds are normal.     Palpations: Abdomen is soft.     Tenderness: There is no abdominal tenderness.  Musculoskeletal: Normal range of motion.  Skin:    General: Skin is warm.     Capillary Refill: Capillary refill takes less than 2 seconds.     Findings: Abrasion present.     Comments: Multiple abrasions to the left upper extremity, largest one is approximately 6 cm in diameter directly over the left olecranon process which is minimally tender.  Some of these abrasions appear old and has scabbing.  Patient states that these abrasions are dry skin that he puts moisturizer on.  Neurological:     Mental Status: He is alert.     Comments:  Alert and oriented to self, place, time and event.  Speech is fluent without dysarthria or dysphasia. Strength 5/5 upper/lower extremities.   Sensation to light touch intact in face, upper/lower extremities. Sits on side of the bed without truncal sway No pronator drift. No leg drop. Normal finger-to-nose.  CN I not  tested CN II grossly intact visual fields bilaterally. Unable to visualize posterior eye. CN III, IV, VI PEERL and EOMs intact bilaterally CN V light touch intact in all 3 divisions of trigeminal nerve CN VII facial movements symmetric CN VIII not tested CN IX, X no uvula deviation, symmetric rise of soft palate  CN XI 5/5 SCM and trapezius strength bilaterally  CN XII Midline tongue protrusion, symmetric L/R movements  Psychiatric:        Mood and Affect: Affect is flat.  Behavior: Behavior normal.      ED Treatments / Results  Labs (all labs ordered are listed, but only abnormal results are displayed) Labs Reviewed  COMPREHENSIVE METABOLIC PANEL - Abnormal; Notable for the following components:      Result Value   CO2 16 (*)    Glucose, Bld 293 (*)    Creatinine, Ser 1.70 (*)    GFR calc non Af Amer 41 (*)    GFR calc Af Amer 47 (*)    All other components within normal limits  LACTIC ACID, PLASMA - Abnormal; Notable for the following components:   Lactic Acid, Venous 4.9 (*)    All other components within normal limits  CK - Abnormal; Notable for the following components:   Total CK 613 (*)    All other components within normal limits  D-DIMER, QUANTITATIVE (NOT AT [facility]) - Abnormal; Notable for the following components:   D-Dimer, Quant 0.64 (*)    All other components within normal limits  CBG MONITORING, ED - Abnormal; Notable for the following components:   Glucose-Capillary 269 (*)    All other components within normal limits  CULTURE, BLOOD (ROUTINE X 2)  CULTURE, BLOOD (ROUTINE X 2)  URINE CULTURE  CBC WITH DIFFERENTIAL/PLATELET  LACTIC ACID, PLASMA  URINALYSIS, ROUTINE W REFLEX MICROSCOPIC  RAPID URINE DRUG SCREEN, HOSP PERFORMED    EKG EKG Interpretation  Date/Time:  Sunday March 22 2019 22:30:02 EDT Ventricular Rate:  90 PR Interval:    QRS Duration: 98 QT Interval:  367 QTC Calculation: 449 R Axis:   67 Text Interpretation:  Sinus rhythm  RSR' in V1 or V2, right VCD or RVH Minimal ST elevation, anterior leads NO STEMI. No old tracing to compare Confirmed by Addison Lank [phone]) on 03/22/2019 11:08:21 PM   Radiology Dg Chest 2 View  Result Date: 03/22/2019 CLINICAL DATA:  68 year old male status post fall in parking lot 1600 hours. Possible syncope. Smoker. EXAM: CHEST - 2 VIEW COMPARISON:  None. FINDINGS: Lung volumes and mediastinal contours are within normal limits. Visualized tracheal air column is within normal limits. No pneumothorax, pulmonary edema, pleural effusion or confluent pulmonary opacity. Diffuse bilateral increased interstitial markings with a basilar predominance. Chronic appearing multiple right lateral rib fractures, at least the 4th through 7th ribs. No acute osseous abnormality identified. Negative visible bowel gas pattern. IMPRESSION: 1. No acute cardiopulmonary abnormality or acute traumatic injury identified; chronic right lateral rib fracture suspected. 2. Probably smoking related increased pulmonary interstitial markings. Electronically Signed   By: Genevie Ann M.D.   On: 03/22/2019 18:37   Dg Elbow Complete Left  Result Date: 03/22/2019 CLINICAL DATA:  68 year old male status post fall. EXAM: LEFT ELBOW - COMPLETE 3+ VIEW COMPARISON:  None. FINDINGS: No evidence of joint effusion. Preserved joint spaces and alignment. Degenerative spurring, including at the dorsal olecranon. No acute osseous abnormality identified. No discrete soft tissue injury. IMPRESSION: No acute fracture or dislocation identified about the left elbow. Electronically Signed   By: Genevie Ann M.D.   On: 03/22/2019 18:38   Ct Head Wo Contrast  Result Date: 03/22/2019 CLINICAL DATA:  Fall with left forehead abrasion. EXAM: CT HEAD WITHOUT CONTRAST TECHNIQUE: Contiguous axial images were obtained from the base of the skull through the vertex without intravenous contrast. COMPARISON:  None. FINDINGS: Brain: No intracranial hemorrhage, mass effect, or  midline shift. Brain volume is normal for age. No hydrocephalus. The basilar cisterns are patent. No evidence of territorial infarct or acute ischemia. No  extra-axial or intracranial fluid collection. Vascular: No hyperdense vessel. Skull: No fracture or focal lesion. Sinuses/Orbits: Scattered opacification/mucosal thickening of ethmoid air cells. No sinus fluid levels. Mastoid air cells are clear. Included orbits are unremarkable. Other: Small left frontal scalp hematoma. IMPRESSION: Small left frontal scalp hematoma. No acute intracranial abnormality. No skull fracture. Electronically Signed   By: Keith Rake M.D.   On: 03/22/2019 20:17    Procedures Procedures (including critical care time)  Medications Ordered in ED Medications  Tdap (BOOSTRIX) injection 0.5 mL (0.5 mLs Intramuscular Given 03/22/19 2206)  sodium chloride 0.9 % bolus 1,000 mL (0 mLs Intravenous Stopped 03/22/19 1920)  sodium chloride 0.9 % bolus 1,000 mL (0 mLs Intravenous Stopped 03/22/19 2110)     Initial Impression / Assessment and Plan / ED Course  I have reviewed the triage vital signs and the nursing notes.  Pertinent labs & imaging results that were available during my care of the patient were reviewed by me and considered in my medical decision making (see chart for details).  I reviewed patient's EMR to obtain pertinent PMH.  68 year old here after near syncope while working outside for 1-2 hrs.  Prodromal generalized weakness.  He has not eaten any all day. Positive head trauma and left upper extremity abrasions.  No LOC.  One episode of emesis on route after the fall.  CBG without hypoglycemia on arrival.   On arrival he has oral temp 100.3, tachycardia in the 120s.  Diaphoretic, warm.  Abrasions as above but no other signs of significant CTL spine, chest or other extremity injury. He is thirsty, feels hot. Dry lips. He is thirsty.  DDX includes heat exhaustion given heat exposue, low-grade oral temp,  diaphoresis.  He is asking for water upon arrival to the ER.  Dry lips. CBG in the 200s upon arrival.  He does not have any chest pain, shortness of breath, pleuritic chest pain, tachypnea, hypoxemia but has history of DVT 1 year ago and states that he did not finish taking Xarelto course due to cost.  Overall clinical picture is not classic for PE other than presyncope, tachycardia and noncompliance which does raise concern for this although lower on differential.  I doubt PE, CVA based on his history and exam today. He has no cardiac prodrome and has no h/o cardiac history, cardiac syncope is less likely.   ER work up reviewed and interpreted by me. Creatinine 1.70 unknown baseline, BUN WNL. Lactic acid 4 > 1.6 after IVFs. CK 613.  All of these suggest likely dehydration.  No leukocytosis and repeat rectal temp normal, tachycardia has resolved after IVF as well. No infection, PTX, edema, opacities in CXR. Hyperglycemia without DKA/HHS. Electrolytes WNL. Hgb WNL. X-rays and head CT without acute injuries from fall. D-dimer age adjusted, normal.  He has been on cardiac monitor without arrhythmias. No urinary symptoms to raise concern for UTI. He denies illicit drug/ETOH use and clinically does not appear intoxicated.  Pt re-evaluated and feels better. Ambulated and stood up with light headedness, near syncope.   Strong suspicion for heat related near syncope.  Given benign symptomatology, work up today low suspicion for cardiac syncope, PE. Ferdinand syncope score is low. Discussed this with patient who is eager to go home.   Encouraged oral hydration, and close monitoring of symptoms. Strict return precautions given. Shared with EDP.  Final Clinical Impressions(s) / ED Diagnoses   Final diagnoses:  Near syncope  Dehydration    ED Discharge Orders  None       Arlean Hopping 03/22/19 2325    Gareth Morgan, MD 03/27/19 [phone]

## 2019-03-22 NOTE — ED Notes (Signed)
The pts iv was started before he went to c-t  Is now in c-t

## 2019-03-22 NOTE — ED Notes (Signed)
Pt discussing if he will let us do a rectal temp

## 2019-03-22 NOTE — ED Notes (Signed)
1st liter of fluid infused  Just had it scanned after he returned from c-t

## 2019-03-22 NOTE — ED Triage Notes (Signed)
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  He was found sitting on the ground  He reports dizziness  Before he fell  No loc   Abrasions to his  Forehead lt elbow  And scattered abrasions to his entire lt arm

## 2019-03-22 NOTE — ED Notes (Signed)
Pt did not void  He reports  I cannot pee until I get all this mess off me

## 2019-03-22 NOTE — ED Notes (Signed)
Back to c-t 

## 2019-03-27 LAB — CULTURE, BLOOD (ROUTINE X 2)
Culture: NO GROWTH
Culture: NO GROWTH
Special Requests: ADEQUATE

## 2019-04-17 DIAGNOSIS — E1149 Type 2 diabetes mellitus with other diabetic neurological complication: Secondary | ICD-10-CM | POA: Diagnosis not present

## 2019-04-23 DIAGNOSIS — I87009 Postthrombotic syndrome without complications of unspecified extremity: Secondary | ICD-10-CM | POA: Diagnosis not present

## 2019-04-23 DIAGNOSIS — N183 Chronic kidney disease, stage 3 (moderate): Secondary | ICD-10-CM | POA: Diagnosis not present

## 2019-04-23 DIAGNOSIS — E1149 Type 2 diabetes mellitus with other diabetic neurological complication: Secondary | ICD-10-CM | POA: Diagnosis not present

## 2019-04-23 DIAGNOSIS — G4733 Obstructive sleep apnea (adult) (pediatric): Secondary | ICD-10-CM | POA: Diagnosis not present

## 2019-04-23 DIAGNOSIS — E1142 Type 2 diabetes mellitus with diabetic polyneuropathy: Secondary | ICD-10-CM | POA: Diagnosis not present

## 2019-04-23 DIAGNOSIS — E785 Hyperlipidemia, unspecified: Secondary | ICD-10-CM | POA: Diagnosis not present

## 2019-08-21 DIAGNOSIS — E1142 Type 2 diabetes mellitus with diabetic polyneuropathy: Secondary | ICD-10-CM | POA: Diagnosis not present

## 2019-08-21 DIAGNOSIS — I87009 Postthrombotic syndrome without complications of unspecified extremity: Secondary | ICD-10-CM | POA: Diagnosis not present

## 2019-08-21 DIAGNOSIS — E1149 Type 2 diabetes mellitus with other diabetic neurological complication: Secondary | ICD-10-CM | POA: Diagnosis not present

## 2019-08-21 DIAGNOSIS — Z1331 Encounter for screening for depression: Secondary | ICD-10-CM | POA: Diagnosis not present

## 2019-08-21 DIAGNOSIS — E785 Hyperlipidemia, unspecified: Secondary | ICD-10-CM | POA: Diagnosis not present

## 2019-08-21 DIAGNOSIS — G4733 Obstructive sleep apnea (adult) (pediatric): Secondary | ICD-10-CM | POA: Diagnosis not present

## 2019-12-14 DIAGNOSIS — E1140 Type 2 diabetes mellitus with diabetic neuropathy, unspecified: Secondary | ICD-10-CM | POA: Diagnosis not present

## 2019-12-14 DIAGNOSIS — E7849 Other hyperlipidemia: Secondary | ICD-10-CM | POA: Diagnosis not present

## 2019-12-17 DIAGNOSIS — E785 Hyperlipidemia, unspecified: Secondary | ICD-10-CM | POA: Diagnosis not present

## 2019-12-17 DIAGNOSIS — G4733 Obstructive sleep apnea (adult) (pediatric): Secondary | ICD-10-CM | POA: Diagnosis not present

## 2019-12-17 DIAGNOSIS — E1142 Type 2 diabetes mellitus with diabetic polyneuropathy: Secondary | ICD-10-CM | POA: Diagnosis not present

## 2019-12-17 DIAGNOSIS — E1149 Type 2 diabetes mellitus with other diabetic neurological complication: Secondary | ICD-10-CM | POA: Diagnosis not present

## 2020-01-07 DIAGNOSIS — H52209 Unspecified astigmatism, unspecified eye: Secondary | ICD-10-CM | POA: Diagnosis not present

## 2020-01-07 DIAGNOSIS — H524 Presbyopia: Secondary | ICD-10-CM | POA: Diagnosis not present

## 2020-01-07 DIAGNOSIS — H269 Unspecified cataract: Secondary | ICD-10-CM | POA: Diagnosis not present

## 2020-01-07 DIAGNOSIS — E119 Type 2 diabetes mellitus without complications: Secondary | ICD-10-CM | POA: Diagnosis not present

## 2020-01-07 DIAGNOSIS — H5213 Myopia, bilateral: Secondary | ICD-10-CM | POA: Diagnosis not present

## 2020-04-26 ENCOUNTER — Inpatient Hospital Stay (HOSPITAL_COMMUNITY)
Admission: EM | Admit: 2020-04-26 | Discharge: 2020-04-29 | DRG: 617 | Disposition: A | Discharge status: Home Health | Payer: Medicare HMO | Attending: Internal Medicine | Admitting: Internal Medicine

## 2020-04-26 ENCOUNTER — Encounter (HOSPITAL_COMMUNITY): Payer: Self-pay | Admitting: *Deleted

## 2020-04-26 ENCOUNTER — Other Ambulatory Visit: Payer: Self-pay

## 2020-04-26 ENCOUNTER — Emergency Department (HOSPITAL_COMMUNITY): Payer: Medicare HMO

## 2020-04-26 DIAGNOSIS — R9431 Abnormal electrocardiogram [ECG] [EKG]: Secondary | ICD-10-CM | POA: Diagnosis not present

## 2020-04-26 DIAGNOSIS — R233 Spontaneous ecchymoses: Secondary | ICD-10-CM | POA: Diagnosis present

## 2020-04-26 DIAGNOSIS — L0390 Cellulitis, unspecified: Secondary | ICD-10-CM | POA: Diagnosis not present

## 2020-04-26 DIAGNOSIS — Z7984 Long term (current) use of oral hypoglycemic drugs: Secondary | ICD-10-CM

## 2020-04-26 DIAGNOSIS — E11628 Type 2 diabetes mellitus with other skin complications: Secondary | ICD-10-CM | POA: Diagnosis present

## 2020-04-26 DIAGNOSIS — Z20822 Contact with and (suspected) exposure to covid-19: Secondary | ICD-10-CM | POA: Diagnosis present

## 2020-04-26 DIAGNOSIS — R7982 Elevated C-reactive protein (CRP): Secondary | ICD-10-CM | POA: Diagnosis present

## 2020-04-26 DIAGNOSIS — E1165 Type 2 diabetes mellitus with hyperglycemia: Secondary | ICD-10-CM | POA: Diagnosis present

## 2020-04-26 DIAGNOSIS — M7989 Other specified soft tissue disorders: Secondary | ICD-10-CM | POA: Diagnosis not present

## 2020-04-26 DIAGNOSIS — M868X7 Other osteomyelitis, ankle and foot: Secondary | ICD-10-CM | POA: Diagnosis present

## 2020-04-26 DIAGNOSIS — E785 Hyperlipidemia, unspecified: Secondary | ICD-10-CM | POA: Diagnosis present

## 2020-04-26 DIAGNOSIS — Z79899 Other long term (current) drug therapy: Secondary | ICD-10-CM | POA: Diagnosis not present

## 2020-04-26 DIAGNOSIS — E875 Hyperkalemia: Secondary | ICD-10-CM | POA: Diagnosis present

## 2020-04-26 DIAGNOSIS — F17210 Nicotine dependence, cigarettes, uncomplicated: Secondary | ICD-10-CM | POA: Diagnosis present

## 2020-04-26 DIAGNOSIS — Z7982 Long term (current) use of aspirin: Secondary | ICD-10-CM

## 2020-04-26 DIAGNOSIS — M19072 Primary osteoarthritis, left ankle and foot: Secondary | ICD-10-CM | POA: Diagnosis not present

## 2020-04-26 DIAGNOSIS — L03116 Cellulitis of left lower limb: Secondary | ICD-10-CM | POA: Diagnosis not present

## 2020-04-26 DIAGNOSIS — S91102A Unspecified open wound of left great toe without damage to nail, initial encounter: Secondary | ICD-10-CM | POA: Diagnosis not present

## 2020-04-26 DIAGNOSIS — B999 Unspecified infectious disease: Secondary | ICD-10-CM

## 2020-04-26 DIAGNOSIS — S98112A Complete traumatic amputation of left great toe, initial encounter: Secondary | ICD-10-CM | POA: Diagnosis not present

## 2020-04-26 DIAGNOSIS — Z720 Tobacco use: Secondary | ICD-10-CM | POA: Diagnosis not present

## 2020-04-26 DIAGNOSIS — M869 Osteomyelitis, unspecified: Secondary | ICD-10-CM | POA: Diagnosis not present

## 2020-04-26 DIAGNOSIS — E1169 Type 2 diabetes mellitus with other specified complication: Secondary | ICD-10-CM | POA: Diagnosis present

## 2020-04-26 DIAGNOSIS — L03032 Cellulitis of left toe: Secondary | ICD-10-CM | POA: Diagnosis present

## 2020-04-26 DIAGNOSIS — Z86718 Personal history of other venous thrombosis and embolism: Secondary | ICD-10-CM | POA: Diagnosis not present

## 2020-04-26 DIAGNOSIS — L03119 Cellulitis of unspecified part of limb: Secondary | ICD-10-CM | POA: Diagnosis not present

## 2020-04-26 LAB — COMPREHENSIVE METABOLIC PANEL
ALT: 20 U/L (ref 0–44)
AST: 20 U/L (ref 15–41)
Albumin: 3.2 g/dL — ABNORMAL LOW (ref 3.5–5.0)
Alkaline Phosphatase: 56 U/L (ref 38–126)
Anion gap: 10 (ref 5–15)
BUN: 17 mg/dL (ref 8–23)
CO2: 24 mmol/L (ref 22–32)
Calcium: 9.1 mg/dL (ref 8.9–10.3)
Chloride: 103 mmol/L (ref 98–111)
Creatinine, Ser: 1.07 mg/dL (ref 0.61–1.24)
GFR calc Af Amer: >60 mL/min (ref >60)
GFR calc non Af Amer: >60 mL/min (ref >60)
Glucose, Bld: 226 mg/dL — ABNORMAL HIGH (ref 70–99)
Potassium: 4.4 mmol/L (ref 3.5–5.1)
Sodium: 137 mmol/L (ref 135–145)
Total Bilirubin: 0.5 mg/dL (ref 0.3–1.2)
Total Protein: 6.6 g/dL (ref 6.5–8.1)

## 2020-04-26 LAB — CBC WITH DIFFERENTIAL/PLATELET
Abs Immature Granulocytes: 0.04 10*3/uL (ref 0.00–0.07)
Basophils Absolute: 0.1 10*3/uL (ref 0.0–0.1)
Basophils Relative: 1 %
Eosinophils Absolute: 0.3 10*3/uL (ref 0.0–0.5)
Eosinophils Relative: 3 %
HCT: 37.9 % — ABNORMAL LOW (ref 39.0–52.0)
Hemoglobin: 12 g/dL — ABNORMAL LOW (ref 13.0–17.0)
Immature Granulocytes: 0 %
Lymphocytes Relative: 24 %
Lymphs Abs: 2.3 10*3/uL (ref 0.7–4.0)
MCH: 31.5 pg (ref 26.0–34.0)
MCHC: 31.7 g/dL (ref 30.0–36.0)
MCV: 99.5 fL (ref 80.0–100.0)
Monocytes Absolute: 0.7 10*3/uL (ref 0.1–1.0)
Monocytes Relative: 7 %
Neutro Abs: 6.2 10*3/uL (ref 1.7–7.7)
Neutrophils Relative %: 65 %
Platelets: 276 10*3/uL (ref 150–400)
RBC: 3.81 MIL/uL — ABNORMAL LOW (ref 4.22–5.81)
RDW: 13.1 % (ref 11.5–15.5)
WBC: 9.5 10*3/uL (ref 4.0–10.5)
nRBC: 0 % (ref 0.0–0.2)

## 2020-04-26 LAB — LACTIC ACID, PLASMA: Lactic Acid, Venous: 1.1 mmol/L (ref 0.5–1.9)

## 2020-04-26 LAB — CBG MONITORING, ED: Glucose-Capillary: 194 mg/dL — ABNORMAL HIGH (ref 70–99)

## 2020-04-26 LAB — PROTIME-INR
INR: 1 (ref 0.8–1.2)
Prothrombin Time: 12.5 seconds (ref 11.4–15.2)

## 2020-04-26 NOTE — ED Notes (Signed)
[name] (Roommate#[phone]) called to speak to the patient. Please let patient know to call her and check his phone.

## 2020-04-26 NOTE — ED Triage Notes (Signed)
To ED for eval of left great toe pain, redness, and drainage for the past few weeks. Pt has not seen a doctor yet. With redness up to mid-shin. Pt complains of pain in toe 'when I first stand up'. Unknown injury to toe. Left foot hot and red. Sensation intact.

## 2020-04-27 ENCOUNTER — Encounter (HOSPITAL_COMMUNITY): Payer: Medicare HMO

## 2020-04-27 ENCOUNTER — Emergency Department (HOSPITAL_COMMUNITY): Payer: Medicare HMO

## 2020-04-27 DIAGNOSIS — E785 Hyperlipidemia, unspecified: Secondary | ICD-10-CM

## 2020-04-27 DIAGNOSIS — M869 Osteomyelitis, unspecified: Secondary | ICD-10-CM

## 2020-04-27 DIAGNOSIS — Z86718 Personal history of other venous thrombosis and embolism: Secondary | ICD-10-CM | POA: Diagnosis not present

## 2020-04-27 DIAGNOSIS — Z7984 Long term (current) use of oral hypoglycemic drugs: Secondary | ICD-10-CM | POA: Diagnosis not present

## 2020-04-27 DIAGNOSIS — Z7982 Long term (current) use of aspirin: Secondary | ICD-10-CM | POA: Diagnosis not present

## 2020-04-27 DIAGNOSIS — Z720 Tobacco use: Secondary | ICD-10-CM | POA: Diagnosis present

## 2020-04-27 DIAGNOSIS — E11628 Type 2 diabetes mellitus with other skin complications: Secondary | ICD-10-CM

## 2020-04-27 DIAGNOSIS — M7989 Other specified soft tissue disorders: Secondary | ICD-10-CM | POA: Diagnosis not present

## 2020-04-27 DIAGNOSIS — Z20822 Contact with and (suspected) exposure to covid-19: Secondary | ICD-10-CM | POA: Diagnosis present

## 2020-04-27 DIAGNOSIS — E875 Hyperkalemia: Secondary | ICD-10-CM | POA: Diagnosis present

## 2020-04-27 DIAGNOSIS — L03119 Cellulitis of unspecified part of limb: Secondary | ICD-10-CM

## 2020-04-27 DIAGNOSIS — R7982 Elevated C-reactive protein (CRP): Secondary | ICD-10-CM | POA: Diagnosis present

## 2020-04-27 DIAGNOSIS — M868X7 Other osteomyelitis, ankle and foot: Secondary | ICD-10-CM | POA: Diagnosis present

## 2020-04-27 DIAGNOSIS — R233 Spontaneous ecchymoses: Secondary | ICD-10-CM | POA: Diagnosis present

## 2020-04-27 DIAGNOSIS — Z79899 Other long term (current) drug therapy: Secondary | ICD-10-CM | POA: Diagnosis not present

## 2020-04-27 DIAGNOSIS — L0390 Cellulitis, unspecified: Secondary | ICD-10-CM | POA: Diagnosis not present

## 2020-04-27 DIAGNOSIS — F17210 Nicotine dependence, cigarettes, uncomplicated: Secondary | ICD-10-CM | POA: Diagnosis present

## 2020-04-27 DIAGNOSIS — E1169 Type 2 diabetes mellitus with other specified complication: Secondary | ICD-10-CM | POA: Diagnosis present

## 2020-04-27 DIAGNOSIS — L03032 Cellulitis of left toe: Secondary | ICD-10-CM | POA: Diagnosis present

## 2020-04-27 DIAGNOSIS — E1165 Type 2 diabetes mellitus with hyperglycemia: Secondary | ICD-10-CM | POA: Diagnosis present

## 2020-04-27 HISTORY — DX: Osteomyelitis, unspecified: M86.9

## 2020-04-27 HISTORY — DX: Type 2 diabetes mellitus with other skin complications: E11.628

## 2020-04-27 LAB — HEMOGLOBIN A1C
Hgb A1c MFr Bld: 8.4 % — ABNORMAL HIGH (ref 4.8–5.6)
Mean Plasma Glucose: 194.38 mg/dL

## 2020-04-27 LAB — SEDIMENTATION RATE: Sed Rate: 53 mm/hr — ABNORMAL HIGH (ref 0–16)

## 2020-04-27 LAB — GLUCOSE, CAPILLARY
Glucose-Capillary: 251 mg/dL — ABNORMAL HIGH (ref 70–99)
Glucose-Capillary: 302 mg/dL — ABNORMAL HIGH (ref 70–99)

## 2020-04-27 LAB — SARS CORONAVIRUS 2 BY RT PCR (HOSPITAL ORDER, PERFORMED IN ~~LOC~~ HOSPITAL LAB): SARS Coronavirus 2: NEGATIVE

## 2020-04-27 LAB — HIV ANTIBODY (ROUTINE TESTING W REFLEX): HIV Screen 4th Generation wRfx: NONREACTIVE

## 2020-04-27 LAB — PREALBUMIN: Prealbumin: 11.2 mg/dL — ABNORMAL LOW (ref 18–38)

## 2020-04-27 LAB — C-REACTIVE PROTEIN: CRP: 6.1 mg/dL — ABNORMAL HIGH

## 2020-04-27 MED ORDER — SODIUM CHLORIDE 0.9 % IV SOLN
2.0000 g | INTRAVENOUS | Status: AC
  Administered 2020-04-27 – 2020-04-29 (×3): 2 g via INTRAVENOUS
  Filled 2020-04-27: qty 2
  Filled 2020-04-27: qty 20
  Filled 2020-04-27: qty 2
  Filled 2020-04-27: qty 20

## 2020-04-27 MED ORDER — SODIUM CHLORIDE 0.9 % IV SOLN
2.0000 g | Freq: Three times a day (TID) | INTRAVENOUS | Status: DC
  Administered 2020-04-27: 2 g via INTRAVENOUS
  Filled 2020-04-27: qty 2

## 2020-04-27 MED ORDER — SODIUM CHLORIDE 0.9% FLUSH
3.0000 mL | Freq: Two times a day (BID) | INTRAVENOUS | Status: DC
  Administered 2020-04-27 – 2020-04-29 (×4): 3 mL via INTRAVENOUS

## 2020-04-27 MED ORDER — ENOXAPARIN SODIUM 40 MG/0.4ML ~~LOC~~ SOLN
40.0000 mg | Freq: Every day | SUBCUTANEOUS | Status: DC
  Administered 2020-04-27 – 2020-04-29 (×3): 40 mg via SUBCUTANEOUS
  Filled 2020-04-27 (×3): qty 0.4

## 2020-04-27 MED ORDER — ROSUVASTATIN CALCIUM 5 MG PO TABS: 5.0000 mg | ORAL_TABLET | ORAL | Status: DC

## 2020-04-27 MED ORDER — INSULIN ASPART 100 UNIT/ML ~~LOC~~ SOLN
0.0000 [IU] | Freq: Every day | SUBCUTANEOUS | Status: DC
  Administered 2020-04-27: 4 [IU] via SUBCUTANEOUS
  Administered 2020-04-28: 2 [IU] via SUBCUTANEOUS

## 2020-04-27 MED ORDER — SODIUM CHLORIDE 0.9 % IV SOLN: 1.0000 g | INTRAVENOUS | Status: DC

## 2020-04-27 MED ORDER — METRONIDAZOLE 500 MG PO TABS
500.0000 mg | ORAL_TABLET | Freq: Three times a day (TID) | ORAL | Status: DC
  Administered 2020-04-27 – 2020-04-29 (×8): 500 mg via ORAL
  Filled 2020-04-27 (×8): qty 1

## 2020-04-27 MED ORDER — ONDANSETRON HCL 4 MG PO TABS: 4.0000 mg | ORAL_TABLET | Freq: Four times a day (QID) | ORAL | Status: DC | PRN

## 2020-04-27 MED ORDER — ACETAMINOPHEN 325 MG PO TABS: 650.0000 mg | ORAL_TABLET | Freq: Four times a day (QID) | ORAL | Status: DC | PRN

## 2020-04-27 MED ORDER — GADOBUTROL 1 MMOL/ML IV SOLN
9.0000 mL | Freq: Once | INTRAVENOUS | Status: AC | PRN
  Administered 2020-04-27: 9 mL via INTRAVENOUS

## 2020-04-27 MED ORDER — ONDANSETRON HCL 4 MG/2ML IJ SOLN: 4.0000 mg | Freq: Four times a day (QID) | INTRAMUSCULAR | Status: DC | PRN

## 2020-04-27 MED ORDER — ASPIRIN 325 MG PO TABS
325.0000 mg | ORAL_TABLET | Freq: Every day | ORAL | Status: DC
  Administered 2020-04-27 – 2020-04-28 (×2): 325 mg via ORAL
  Filled 2020-04-27 (×2): qty 1

## 2020-04-27 MED ORDER — METRONIDAZOLE IN NACL 5-0.79 MG/ML-% IV SOLN: 500.0000 mg | Freq: Three times a day (TID) | INTRAVENOUS | Status: DC

## 2020-04-27 MED ORDER — INSULIN ASPART 100 UNIT/ML ~~LOC~~ SOLN
0.0000 [IU] | Freq: Three times a day (TID) | SUBCUTANEOUS | Status: DC
  Administered 2020-04-27: 8 [IU] via SUBCUTANEOUS
  Administered 2020-04-28: 2 [IU] via SUBCUTANEOUS
  Administered 2020-04-28: 5 [IU] via SUBCUTANEOUS
  Administered 2020-04-28: 8 [IU] via SUBCUTANEOUS
  Administered 2020-04-28: 0 [IU] via SUBCUTANEOUS
  Administered 2020-04-29 (×2): 5 [IU] via SUBCUTANEOUS
  Administered 2020-04-29: 11 [IU] via SUBCUTANEOUS

## 2020-04-27 MED ORDER — ACETAMINOPHEN 650 MG RE SUPP: 650.0000 mg | Freq: Four times a day (QID) | RECTAL | Status: DC | PRN

## 2020-04-27 NOTE — ED Notes (Signed)
Pt denies any claustrophobia

## 2020-04-27 NOTE — Progress Notes (Signed)
Pharmacy Antibiotic Note  Mitch Arquette. is a 69 y.o. male admitted on 04/26/2020 with foot infection. Pharmacy has been consulted for cefepime dosing. Pt is afebrile and WBC is WNL. SCr and lactic acid are both WNL.  Plan: Cefepime 2gm IV Q8H F/u renal fxn, C&S, clinical status   Height: 6\' 5"  (195.6 cm) Weight: 93 kg (205 lb) IBW/kg (Calculated) : 89.1  Temp (24hrs), Avg:98.8 F (37.1 C), Min:98.4 F (36.9 C), Max:99.3 F (37.4 C)  Recent Labs  Lab 04/26/20 0848  WBC 9.5  CREATININE 1.07  LATICACIDVEN 1.1    Estimated Creatinine Clearance: 82.1 mL/min (by C-G formula based on SCr of 1.07 mg/dL).    No Known Allergies  Antimicrobials this admission: Cefepime 8/11>> Flagyl 8/11>>  Dose adjustments this admission: N/A  Microbiology results: Pending  Thank you for allowing pharmacy to be a part of this patient's care.  Waylan Busta, Rande Lawman 04/27/2020 8:52 AM

## 2020-04-27 NOTE — Consult Note (Signed)
[place] Nurse Consult Note: Patient receiving care in [facility] ED 2.  Orthopedic surgery has been consulted. Dr. Sharol Given is to see later today. See note from M. Jacqulynn Cadet, PA from today at 10:26.  Additional diagnostic studies to be done according to his information. Reason for Consult: Wound type: Pressure Injury POA: Yes/No/NA Measurement: Wound bed: Drainage (amount, consistency, odor)  Periwound: Dressing procedure/placement/frequency: Please reconsult WOC if needed.  I have not see the patient and will not follow. Val Riles, RN, MSN, CWOCN, CNS-BC, pager [phone]

## 2020-04-27 NOTE — Plan of Care (Signed)
  Problem: Education: Goal: Knowledge of General Education information will improve Description: Including pain rating scale, medication(s)/side effects and non-pharmacologic comfort measures Outcome: Progressing   Problem: Clinical Measurements: Goal: Ability to maintain clinical measurements within normal limits will improve Outcome: Progressing   

## 2020-04-27 NOTE — ED Provider Notes (Signed)
[facility] EMERGENCY DEPARTMENT Provider Note   CSN: [id number] Arrival date & time: 04/26/20  6834     History Chief Complaint  Patient presents with  . Foot Pain  . Wound Infection    Micheal Moore. is a 69 y.o. male who presents emergency department with a chief complaint of foot swelling.  He has a past medical history of diabetes and previous history of DVT.  Patient states that he has been unable to afford Xarelto and so is only on aspirin at this point.  Took his shoe off yesterday and noticed that his left great toe "did not look right."  He has had increased pain and swelling in his calves.  He does not know what neuropathy is but states that he does not think he has it.  He has broken his toes in the past and not known that he has done it.  He denies any known injuries to the foot.  His dog was licking his toe to "clean it."  He denies any fevers, chills, chest pain, shortness of breath.  HPI     Past Medical History:  Diagnosis Date  . Diabetes mellitus without complication ([place])     There are no problems to display for this patient.   History reviewed. No pertinent surgical history.     No family history on file.  Social History   Tobacco Use  . Smoking status: Current Every Day Smoker  . Smokeless tobacco: Never Used  Substance Use Topics  . Alcohol use: No  . Drug use: Not on file    Home Medications Prior to Admission medications   Medication Sig Start Date End Date Taking? Authorizing Provider  aspirin 325 MG tablet Take 325 mg by mouth at bedtime.   Yes [provider]  glimepiride (AMARYL) 2 MG tablet Take 2 mg by mouth at bedtime.  01/26/19  Yes [provider]  metFORMIN (GLUCOPHAGE) 500 MG tablet Take 500 mg by mouth 2 (two) times daily. 02/13/19  Yes [provider]  rosuvastatin (CRESTOR) 5 MG tablet Take 5 mg by mouth 2 (two) times a week. On Tuesday & Saturday 03/26/20  Yes [provider]  Rivaroxaban 15 & 20 MG TBPK Take as directed on package: Start with one 15mg  tablet by mouth twice a day with food. On Day 22, switch to one 20mg  tablet once a day with food. Patient not taking: Reported on 03/22/2019 03/18/18   Dorie Rank, MD    Allergies    Patient has no known allergies.  Review of Systems   Review of Systems Ten systems reviewed and are negative for acute change, except as noted in the HPI.   Physical Exam Updated Vital Signs BP (!) 147/87 (BP Location: Right Arm)   Pulse 94   Temp 99.3 F (37.4 C) (Oral)   Resp 18   Ht 6\' 5"  (1.956 m)   Wt 93 kg   SpO2 99%   BMI 24.31 kg/m   Physical Exam Vitals and nursing note reviewed.  Constitutional:      General: He is not in acute distress.    Appearance: He is well-developed. He is not diaphoretic.  HENT:     Head: Normocephalic and atraumatic.  Eyes:     General: No scleral icterus.    Conjunctiva/sclera: Conjunctivae normal.  Cardiovascular:     Rate and Rhythm: Normal rate and regular rhythm.     Pulses:  Dorsalis pedis pulses are 2+ on the right side and detected w/ Doppler on the left side.       Posterior tibial pulses are 2+ on the right side and 2+ on the left side.     Heart sounds: Normal heart sounds.  Pulmonary:     Effort: Pulmonary effort is normal. No respiratory distress.     Breath sounds: Normal breath sounds.  Abdominal:     Palpations: Abdomen is soft.     Tenderness: There is no abdominal tenderness.  Musculoskeletal:     Cervical back: Normal range of motion and neck supple.     Right lower leg: 2+ Edema present.     Left lower leg: 3+ Edema present.  Skin:    General: Skin is warm and dry.     Comments: Left great toe with nail eversion, friable corneum loose and which is pale with underlying purulent discharge which expresses from underneath the nail.  There is violaceous erythema over the proximal left great toe with extension of the erythema up to the  proximal calf.  No sensation to any touch in the left distal great toe. Minimal tenderness proximally  Neurological:     Mental Status: He is alert.  Psychiatric:        Behavior: Behavior normal.         ED Results / Procedures / Treatments   Labs (all labs ordered are listed, but only abnormal results are displayed) Labs Reviewed  COMPREHENSIVE METABOLIC PANEL - Abnormal; Notable for the following components:      Result Value   Glucose, Bld 226 (*)    Albumin 3.2 (*)    All other components within normal limits  CBC WITH DIFFERENTIAL/PLATELET - Abnormal; Notable for the following components:   RBC 3.81 (*)    Hemoglobin 12.0 (*)    HCT 37.9 (*)    All other components within normal limits  CBG MONITORING, ED - Abnormal; Notable for the following components:   Glucose-Capillary 194 (*)    All other components within normal limits  LACTIC ACID, PLASMA  PROTIME-INR    EKG None  Radiology DG Foot Complete Left  Result Date: 04/26/2020 CLINICAL DATA:  First toe pain and swelling, initial encounter EXAM: LEFT FOOT - COMPLETE 3+ VIEW COMPARISON:  None. FINDINGS: Soft tissue swelling is noted in the first toe. Degenerative changes of the first MTP joint are noted. Some lucency is noted at the base of the first distal phalanx suspicious for undisplaced fracture. No definitive bony erosive changes are seen to suggest osteomyelitis. IMPRESSION: Soft tissue swelling without definitive osteomyelitis. There are changes suspicious for undisplaced first distal phalangeal fracture. Electronically Signed   By: Inez Catalina M.D.   On: 04/26/2020 09:00    Procedures Procedures (including critical care time)  Medications Ordered in ED Medications - No data to display  ED Course  I have reviewed the triage vital signs and the nursing notes.  Pertinent labs & imaging results that were available during my care of the patient were reviewed by me and considered in my medical decision  making (see chart for details).  Clinical Course as of Apr 27 1008  Wed Apr 27, 2020  0842 Spoke with PA Jacqulynn Cadet- plan medicine admit, surgery Friday with Sharol Given   [AH]    Clinical Course User Index [AH] Margarita Mail, PA-C   MDM Rules/Calculators/A&P  CC: Left foot swelling VS:  Vitals:   04/26/20 1730 04/26/20 2011 04/27/20 0014 04/27/20 0538  BP: 135/81 137/84 121/69 (!) 147/87  Pulse: 89 94 92 94  Resp: 20 15 16 18   Temp:  98.4 F (36.9 C) 98.8 F (37.1 C) 99.3 F (37.4 C)  TempSrc:  Oral Oral Oral  SpO2: 100% 99% 96% 99%  Weight:      Height:        VU:DTHYHOO is gathered by  patient and EMR. Previous records obtained and reviewed. DDX:The patient's complaint of toe infection/leg swelling involves an extensive number of diagnostic and treatment options, and is a complaint that carries with it a high risk of complications, morbidity, and potential mortality. Given the large differential diagnosis, medical decision making is of high complexity. ;localized abscess, osteomyelitis, cellulitis. Venous stasis/ dermatitis and DVT less likely. I have low suspicion for CHF. Labs: I ordered reviewed and interpreted labs which include   CBC without Leukocytosis CMP with elevated blood sugar Lactate with out elevation. PT/INR within normal limits. Covid test pending. Imaging: I ordered and reviewed images which included left foot x-ray. I independently visualized and interpreted all imaging. There are no acute, significant findings on today's images. EKG: Consults: Spoke with Silvestre Gunner. See documentation in ED course MDM: Patient with infection of the left great toe, cellulitic changes. I have ordered cefepime and vancomycin. No current evidence of osteomyelitis. Discussed the case with Ortho who asked for MRI of the foot. Patient images are pending. He will be admitted to the hospitalist service. Discussed with Dr. Mohammed Kindle. Patient disposition:admit The  patient appears reasonably stabilized for admission considering the current resources, flow, and capabilities available in the ED at this time, and I doubt any other [facility] requiring further screening and/or treatment in the ED prior to admission.        Final Clinical Impression(s) / ED Diagnoses Final diagnoses:  Infection    Rx / DC Orders ED Discharge Orders    None       Margarita Mail, PA-C 04/27/20 1030    Little, Wenda Overland, MD 05/01/20 1032

## 2020-04-27 NOTE — H&P (Signed)
History and Physical    Micheal Moore. WLN:[id number] DOB: [birth year] DOA: 04/26/2020  Referring MD/NP/PA: Margarita Mail, PA-C PCP: [name], MD  Patient coming from: Home  Chief Complaint: Left foot swelling  I have personally briefly reviewed patient's old medical records in [place]   HPI: Micheal Moore. is a 69 y.o. male with medical history significant of diabetes mellitus type 2, hyperlipidemia, history of DVT not on anticoagulation, and tobacco abuse presents with complaints of swelling of the great toe of his left foot.  He is unsure of how he possibly injured his foot, but reports that the swelling has been present there for the last 3 to 4 weeks now.  He has been trying to elevate the foot at home and had reportedly been placing "sports alcohol" on the wound at home with attempts to try and clean it.  He reports that his dog had also been licking the area.  Notes associated symptoms of some pain of the left toe with standing, redness, drainage from the wound, and urinary frequency.  He relates urinary frequency to him being on glipizide.  Denies having any significant fever, chills, chest pain,, nausea, vomiting, or diarrhea.  Patient does not routinely check his blood sugars,.  ED Course: Upon admission into the emergency department patient was seen in the afebrile with vital signs relatively within normal limits.  Labs significant for hemoglobin 12 and glucose 226. COVID-19 screening was negative. X-rays revealed soft tissue swelling without definitive osteomyelitis and changes suspicious for nondisplaced first distal pharyngeal fracture.  Orthopedics was consulted who recommended checking MRI of the foot.  Patient had been started on empiric antibiotics of cefepime and metronidazole.  Review of Systems  Constitutional: Negative for fever and malaise/fatigue.  HENT: Negative for ear discharge and nosebleeds.   Eyes: Negative for double vision and  photophobia.  Respiratory: Negative for cough and shortness of breath.   Cardiovascular: Positive for PND. Negative for chest pain.  Gastrointestinal: Negative for abdominal pain, nausea and vomiting.  Genitourinary: Positive for frequency. Negative for dysuria.  Musculoskeletal: Positive for joint pain and myalgias.  Skin: Negative for rash.       Positive for skin color change  Neurological: Negative for sensory change and loss of consciousness.  Endo/Heme/Allergies: Positive for polydipsia.  Psychiatric/Behavioral: Negative for substance abuse. The patient is not nervous/anxious.      Past Medical History:  Diagnosis Date  . Diabetes mellitus without complication ([place])     History reviewed. No pertinent surgical history.   reports that he has been smoking. He has never used smokeless tobacco. He reports that he does not drink alcohol. No history on file for drug use.  No Known Allergies   No family history on file.  Prior to Admission medications   Medication Sig Start Date End Date Taking? Authorizing Provider  aspirin 325 MG tablet Take 325 mg by mouth at bedtime.   Yes [provider]  glimepiride (AMARYL) 2 MG tablet Take 2 mg by mouth at bedtime.  01/26/19  Yes [provider]  metFORMIN (GLUCOPHAGE) 500 MG tablet Take 500 mg by mouth 2 (two) times daily. 02/13/19  Yes [provider]  rosuvastatin (CRESTOR) 5 MG tablet Take 5 mg by mouth 2 (two) times a week. On Tuesday & Saturday 03/26/20  Yes [provider]  Rivaroxaban 15 & 20 MG TBPK Take as directed on package: Start with one 15mg  tablet by mouth twice a day with food.  On Day 22, switch to one 20mg  tablet once a day with food. Patient not taking: Reported on 03/22/2019 03/18/18   Dorie Rank, MD    Physical Exam:  Constitutional: Male NAD, calm, comfortable Vitals:   04/26/20 2011 04/27/20 0014 04/27/20 0538 04/27/20 1115  BP: 137/84 121/69 (!) 147/87 131/80  Pulse: 94 92 94 95   Resp: 15 16 18 13   Temp: 98.4 F (36.9 C) 98.8 F (37.1 C) 99.3 F (37.4 C)   TempSrc: Oral Oral Oral   SpO2: 99% 96% 99% 98%  Weight:      Height:       Eyes: PERRL, lids and conjunctivae normal ENMT: Mucous membranes are moist. Posterior pharynx clear of any exudate or lesions.Normal dentition.  Neck: normal, supple, no masses, no thyromegaly Respiratory: clear to auscultation bilaterally, no wheezing, no crackles. Normal respiratory effort. No accessory muscle use.  Cardiovascular: Regular rate and rhythm, no murmurs / rubs / gallops. No extremity edema. 2+ pedal pulses. No carotid bruits.  Abdomen: no tenderness, no masses palpated. No hepatosplenomegaly. Bowel sounds positive.  Musculoskeletal: no clubbing / cyanosis. No joint deformity upper and lower extremities. Good ROM, no contractures. Normal muscle tone.  Skin: Ulceration of the left great toe with swelling and erythema present on the dorsal aspect of the foot. Neurologic: CN 2-12 grossly intact. Sensation intact, DTR normal. Strength 5/5 in all 4.  Psychiatric: Normal judgment and insight. Alert and oriented x 3. Normal mood.     Labs on Admission: I have personally reviewed following labs and imaging studies  CBC: Recent Labs  Lab 04/26/20 0848  WBC 9.5  NEUTROABS 6.2  HGB 12.0*  HCT 37.9*  MCV 99.5  PLT 644   Basic Metabolic Panel: Recent Labs  Lab 04/26/20 0848  NA 137  K 4.4  CL 103  CO2 24  GLUCOSE 226*  BUN 17  CREATININE 1.07  CALCIUM 9.1   GFR: Estimated Creatinine Clearance: 82.1 mL/min (by C-G formula based on SCr of 1.07 mg/dL). Liver Function Tests: Recent Labs  Lab 04/26/20 0848  AST 20  ALT 20  ALKPHOS 56  BILITOT 0.5  PROT 6.6  ALBUMIN 3.2*   No results for input(s): LIPASE, AMYLASE in the last 168 hours. No results for input(s): AMMONIA in the last 168 hours. Coagulation Profile: Recent Labs  Lab 04/26/20 0848  INR 1.0   Cardiac Enzymes: No results for input(s):  CKTOTAL, CKMB, CKMBINDEX, TROPONINI in the last 168 hours. BNP (last 3 results) No results for input(s): PROBNP in the last 8760 hours. HbA1C: No results for input(s): HGBA1C in the last 72 hours. CBG: Recent Labs  Lab 04/26/20 0839  GLUCAP 194*   Lipid Profile: No results for input(s): CHOL, HDL, LDLCALC, TRIG, CHOLHDL, LDLDIRECT in the last 72 hours. Thyroid Function Tests: No results for input(s): TSH, T4TOTAL, FREET4, T3FREE, THYROIDAB in the last 72 hours. Anemia Panel: No results for input(s): VITAMINB12, FOLATE, FERRITIN, TIBC, IRON, RETICCTPCT in the last 72 hours. Urine analysis: No results found for: COLORURINE, APPEARANCEUR, LABSPEC, PHURINE, GLUCOSEU, HGBUR, BILIRUBINUR, KETONESUR, PROTEINUR, UROBILINOGEN, NITRITE, LEUKOCYTESUR Sepsis Labs: Recent Results (from the past 240 hour(s))  SARS Coronavirus 2 by RT PCR (hospital order, performed in [facility] hospital lab) Nasopharyngeal Nasopharyngeal Swab     Status: None   Collection Time: 04/27/20  9:35 AM   Specimen: Nasopharyngeal Swab  Result Value Ref Range Status   SARS Coronavirus 2 NEGATIVE NEGATIVE Final    Comment: (NOTE) SARS-CoV-2 target nucleic acids  are NOT DETECTED.  The SARS-CoV-2 RNA is generally detectable in upper and lower respiratory specimens during the acute phase of infection. The lowest concentration of SARS-CoV-2 viral copies this assay can detect is 250 copies / mL. A negative result does not preclude SARS-CoV-2 infection and should not be used as the sole basis for treatment or other patient management decisions.  A negative result may occur with improper specimen collection / handling, submission of specimen other than nasopharyngeal swab, presence of viral mutation(s) within the areas targeted by this assay, and inadequate number of viral copies (<250 copies / mL). A negative result must be combined with clinical observations, patient history, and epidemiological information.  Fact Sheet  for Patients:   StrictlyIdeas.no  Fact Sheet for Healthcare Providers: BankingDealers.co.za  This test is not yet approved or  cleared by the [place] FDA and has been authorized for detection and/or diagnosis of SARS-CoV-2 by FDA under an Emergency Use Authorization (EUA).  This EUA will remain in effect (meaning this test can be used) for the duration of the COVID-19 declaration under Section 564(b)(1) of the Act, 21 U.S.C. section 360bbb-3(b)(1), unless the authorization is terminated or revoked sooner.  Performed at [facility], [place] [address] [place],  [address]      Radiological Exams on Admission: MR FOOT LEFT W WO CONTRAST  Result Date: 04/27/2020 CLINICAL DATA:  Pain and swelling of the left foot. Diabetic. EXAM: MRI OF THE LEFT FOREFOOT WITHOUT AND WITH CONTRAST TECHNIQUE: Multiplanar, multisequence MR imaging of the left foot was performed both before and after administration of intravenous contrast. CONTRAST:  25mL GADAVIST GADOBUTROL 1 MMOL/ML IV SOLN COMPARISON:  Radiographs 04/26/2020 FINDINGS: Marked diffuse abnormal T1 and T2 signal intensity in the distal phalanx of the great toe along with diffuse contrast enhancement consistent with osteomyelitis. There is also a small open wound noted along the plantar and lateral aspect of the great toe with an underlying small subcutaneous abscess. Diffuse surrounding cellulitis involving the great toe and the entire forefoot. There is also diffuse myositis without findings for pyomyositis. I do not see any findings suspicious for septic arthritis. IMPRESSION: 1. Osteomyelitis involving the distal phalanx of the great toe. 2. Small open wound along the plantar and lateral aspect of the great toe with an underlying small subcutaneous abscess. 3. Diffuse cellulitis and myositis without findings for pyomyositis or septic arthritis. Electronically Signed   By: Marijo Sanes M.D.    On: 04/27/2020 11:19   DG Foot Complete Left  Result Date: 04/26/2020 CLINICAL DATA:  First toe pain and swelling, initial encounter EXAM: LEFT FOOT - COMPLETE 3+ VIEW COMPARISON:  None. FINDINGS: Soft tissue swelling is noted in the first toe. Degenerative changes of the first MTP joint are noted. Some lucency is noted at the base of the first distal phalanx suspicious for undisplaced fracture. No definitive bony erosive changes are seen to suggest osteomyelitis. IMPRESSION: Soft tissue swelling without definitive osteomyelitis. There are changes suspicious for undisplaced first distal phalangeal fracture. Electronically Signed   By: Inez Catalina M.D.   On: 04/26/2020 09:00    EKG: Independently reviewed.  Sinus rhythm at 87 bpm  Assessment/Plan  Cellulitis of left foot Diabetic foot infection Osteomyelitis of 1st toe: Acute.  Patient present with complaints of a wound of the left great toe with swelling and erythema.  Unclear cause of injury.  X-ray imaging concerning for soft tissue swelling without definitive signs of osteomyelitis, but MRI of the foot did  revealed acute osteomyelitis.. -Admit to MedSurg bed -Lower extremity wound order set/cellulitis focus order set utilized -Follow-up blood cultures -Check sed rate, CRP, and prealbumin -Follow-up ABI with and without TBI -Wound care consult -Continue empiric antibiotics of Rocephin and metronidazole -Orthopedics consulted, we will follow for further recommendations  Diabetes mellitus type 2 uncontrolled: Patient presents with glucose elevated at 226.  He appears not to be monitoring blood sugars closely at home.  Home medications include Amaryl 2 mg nightly and Metformin 500 mg twice daily.   -Hypoglycemic protocols -Hold home oral medications -Check hemoglobin A1c (8.4)  -CBGs before every meal and at bedtime with moderate SSI -Diabetic education  Hyperlipidemia: Home medication regimen includes Crestor 5 mg on Tuesdays and  Saturdays. -Continue current regimen   History of DVT: Patient previously on Xarelto for unprovoked DVT in2019., but had not been taking in over 2 months due to cost and his primary care provider placed him on 325 mg daily instead. -Continue aspirin -Check Doppler ultrasound of the lower extremities  Tobacco use: Patient still reports smoking 1/3 pack cigarettes per day on average and declines need of nicotine patch. -Counseled on the need of cessation of tobacco use    DVT prophylaxis: Lovenox Code Status: Full Family Communication: Roommate updated over the phone Disposition Plan: To be determined Consults called: Orthopedics Admission status: Inpatient  Norval Morton MD [facility] Pager [phone]   If 7PM-7AM, please contact night-coverage [email or web address] Password [facility]  04/27/2020, 12:14 PM

## 2020-04-27 NOTE — ED Notes (Signed)
Pt in MRI.

## 2020-04-27 NOTE — Consult Note (Signed)
Reason for Consult:Left great toe infection Referring Physician: R Moore  Micheal Moore. is an 69 y.o. male.  HPI: Micheal Moore comes to the ED with a weeks-long history of left great toe swelling and redness. He's unable to tell me exactly how long it's been like this. He denies pain but does say it's tingly when he first gets up to walk. He denies similar e/o except when he broke a bone in the other foot and it swelled up. He denies fevers, chills, sweats, N/V. He lives at home and does not use any assistive devices. He still works part-time but no physical component except walking.  Past Medical History:  Diagnosis Date  . Diabetes mellitus without complication ([place])     History reviewed. No pertinent surgical history.  No family history on file.  Social History:  reports that he has been smoking. He has never used smokeless tobacco. He reports that he does not drink alcohol. No history on file for drug use.  Allergies: No Known Allergies  Medications: I have reviewed the patient's current medications.  Results for orders placed or performed during the hospital encounter of 04/26/20 (from the past 48 hour(s))  CBG monitoring, ED     Status: Abnormal   Collection Time: 04/26/20  8:39 AM  Result Value Ref Range   Glucose-Capillary 194 (H) 70 - 99 mg/dL    Comment: Glucose reference range applies only to samples taken after fasting for at least 8 hours.  Comprehensive metabolic panel     Status: Abnormal   Collection Time: 04/26/20  8:48 AM  Result Value Ref Range   Sodium 137 135 - 145 mmol/L   Potassium 4.4 3.5 - 5.1 mmol/L   Chloride 103 98 - 111 mmol/L   CO2 24 22 - 32 mmol/L   Glucose, Bld 226 (H) 70 - 99 mg/dL    Comment: Glucose reference range applies only to samples taken after fasting for at least 8 hours.   BUN 17 8 - 23 mg/dL   Creatinine, Ser 1.07 0.61 - 1.24 mg/dL   Calcium 9.1 8.9 - 10.3 mg/dL   Total Protein 6.6 6.5 - 8.1 g/dL   Albumin 3.2 (L) 3.5 - 5.0  g/dL   AST 20 15 - 41 U/L   ALT 20 0 - 44 U/L   Alkaline Phosphatase 56 38 - 126 U/L   Total Bilirubin 0.5 0.3 - 1.2 mg/dL   GFR calc non Af Amer >60 >60 mL/min   GFR calc Af Amer >60 >60 mL/min   Anion gap 10 5 - 15    Comment: Performed at [facility], [place] [address] [place], [place] [address]  Lactic acid, plasma     Status: None   Collection Time: 04/26/20  8:48 AM  Result Value Ref Range   Lactic Acid, Venous 1.1 0.5 - 1.9 mmol/L    Comment: Performed at [facility] [address], [place], [place] [address]  CBC with Differential     Status: Abnormal   Collection Time: 04/26/20  8:48 AM  Result Value Ref Range   WBC 9.5 4.0 - 10.5 K/uL   RBC 3.81 (L) 4.22 - 5.81 MIL/uL   Hemoglobin 12.0 (L) 13.0 - 17.0 g/dL   HCT 37.9 (L) 39 - 52 %   MCV 99.5 80.0 - 100.0 fL   MCH 31.5 26.0 - 34.0 pg   MCHC 31.7 30.0 - 36.0 g/dL   RDW 13.1 11.5 - 15.5 %  Platelets 276 150 - 400 K/uL   nRBC 0.0 0.0 - 0.2 %   Neutrophils Relative % 65 %   Neutro Abs 6.2 1.7 - 7.7 K/uL   Lymphocytes Relative 24 %   Lymphs Abs 2.3 0.7 - 4.0 K/uL   Monocytes Relative 7 %   Monocytes Absolute 0.7 0 - 1 K/uL   Eosinophils Relative 3 %   Eosinophils Absolute 0.3 0 - 0 K/uL   Basophils Relative 1 %   Basophils Absolute 0.1 0 - 0 K/uL   Immature Granulocytes 0 %   Abs Immature Granulocytes 0.04 0.00 - 0.07 K/uL    Comment: Performed at [facility] [address], [place], [place] [address]  Protime-INR     Status: None   Collection Time: 04/26/20  8:48 AM  Result Value Ref Range   Prothrombin Time 12.5 11.4 - 15.2 seconds   INR 1.0 0.8 - 1.2    Comment: (NOTE) INR goal varies based on device and disease states. Performed at [facility], [place] [address] [place], [place] [address]     DG Foot Complete Left  Result Date: 04/26/2020 CLINICAL DATA:  First toe pain and swelling, initial encounter EXAM: LEFT FOOT - COMPLETE 3+ VIEW COMPARISON:  None. FINDINGS: Soft tissue  swelling is noted in the first toe. Degenerative changes of the first MTP joint are noted. Some lucency is noted at the base of the first distal phalanx suspicious for undisplaced fracture. No definitive bony erosive changes are seen to suggest osteomyelitis. IMPRESSION: Soft tissue swelling without definitive osteomyelitis. There are changes suspicious for undisplaced first distal phalangeal fracture. Electronically Signed   By: Inez Catalina M.D.   On: 04/26/2020 09:00    Review of Systems  Constitutional: Negative for chills, diaphoresis and fever.  HENT: Negative for ear discharge, ear pain, hearing loss and tinnitus.   Eyes: Negative for photophobia and pain.  Respiratory: Negative for cough and shortness of breath.   Cardiovascular: Negative for chest pain.  Gastrointestinal: Negative for abdominal pain, nausea and vomiting.  Genitourinary: Negative for dysuria, flank pain, frequency and urgency.  Musculoskeletal: Positive for joint swelling (Left great toe). Negative for arthralgias, back pain, myalgias and neck pain.  Neurological: Negative for dizziness and headaches.  Hematological: Does not bruise/bleed easily.  Psychiatric/Behavioral: The patient is not nervous/anxious.    Blood pressure (!) 147/87, pulse 94, temperature 99.3 F (37.4 C), temperature source Oral, resp. rate 18, height 6\' 5"  (1.956 m), weight 93 kg, SpO2 99 %. Physical Exam Constitutional:      General: He is not in acute distress.    Appearance: He is well-developed. He is not diaphoretic.  HENT:     Head: Normocephalic and atraumatic.  Eyes:     General: No scleral icterus.       Right eye: No discharge.        Left eye: No discharge.     Conjunctiva/sclera: Conjunctivae normal.  Cardiovascular:     Rate and Rhythm: Normal rate and regular rhythm.  Pulmonary:     Effort: Pulmonary effort is normal. No respiratory distress.  Musculoskeletal:     Cervical back: Normal range of motion.     Comments: LLE No  traumatic wounds, ecchymosis, or rash  Fusiform edema great toe, onycholysis with underlying purulence, and erythema  No knee or ankle effusion  Knee stable to varus/ valgus and anterior/posterior stress  Sens DPN, SPN, TN intact  Motor EHL, ext, flex, evers 5/5  DP 2+, PT 0, 2+ edema  Skin:    General: Skin is warm and dry.  Neurological:     Mental Status: He is alert.  Psychiatric:        Behavior: Behavior normal.     Assessment/Plan: Left great toe infection -- Will check MRI to make sure there's no underlying osteo. Also check ABI. May be able to treat this medically. Dr. Sharol Given to evaluate later today or tomorrow. DM    Lisette Abu, PA-C Orthopedic Surgery [phone] 04/27/2020, 10:26 AM

## 2020-04-27 NOTE — ED Notes (Signed)
Pt returned from MRI.  Ambulatory to bathroom.  Denies any needs at this time.

## 2020-04-27 NOTE — ED Notes (Signed)
RN walked in room and pt's visitor at bedside.  Pt was taking the last bite of whopper and fries.  Informed pt that we need to check his blood sugar before he eats another meal- AC and HS.  Order for CBG prior to meal and sliding scale insulin was placed after pt ate lunch tray.  Informed pt that we need to check prior to dinner.

## 2020-04-28 ENCOUNTER — Inpatient Hospital Stay (HOSPITAL_COMMUNITY): Payer: Medicare HMO | Admitting: Certified Registered Nurse Anesthetist

## 2020-04-28 ENCOUNTER — Encounter (HOSPITAL_COMMUNITY)
Admission: EM | Disposition: A | Discharge status: Home Health | Payer: Self-pay | Source: Home / Self Care | Attending: Internal Medicine

## 2020-04-28 ENCOUNTER — Inpatient Hospital Stay (HOSPITAL_COMMUNITY): Payer: Medicare HMO

## 2020-04-28 ENCOUNTER — Other Ambulatory Visit: Payer: Self-pay | Admitting: Physician Assistant

## 2020-04-28 DIAGNOSIS — L0390 Cellulitis, unspecified: Secondary | ICD-10-CM | POA: Diagnosis not present

## 2020-04-28 DIAGNOSIS — M869 Osteomyelitis, unspecified: Secondary | ICD-10-CM

## 2020-04-28 DIAGNOSIS — Z720 Tobacco use: Secondary | ICD-10-CM

## 2020-04-28 DIAGNOSIS — E11628 Type 2 diabetes mellitus with other skin complications: Secondary | ICD-10-CM

## 2020-04-28 DIAGNOSIS — M7989 Other specified soft tissue disorders: Secondary | ICD-10-CM | POA: Diagnosis not present

## 2020-04-28 DIAGNOSIS — E785 Hyperlipidemia, unspecified: Secondary | ICD-10-CM

## 2020-04-28 DIAGNOSIS — L03119 Cellulitis of unspecified part of limb: Secondary | ICD-10-CM

## 2020-04-28 DIAGNOSIS — E1169 Type 2 diabetes mellitus with other specified complication: Secondary | ICD-10-CM | POA: Diagnosis not present

## 2020-04-28 HISTORY — PX: AMPUTATION TOE: SHX6595

## 2020-04-28 LAB — GLUCOSE, CAPILLARY
Glucose-Capillary: 248 mg/dL — ABNORMAL HIGH (ref 70–99)
Glucose-Capillary: 288 mg/dL — ABNORMAL HIGH (ref 70–99)
Glucose-Capillary: 93 mg/dL (ref 70–99)
Glucose-Capillary: 127 mg/dL — ABNORMAL HIGH (ref 70–99)
Glucose-Capillary: 127 mg/dL — ABNORMAL HIGH (ref 70–99)
Glucose-Capillary: 141 mg/dL — ABNORMAL HIGH (ref 70–99)
Glucose-Capillary: 245 mg/dL — ABNORMAL HIGH (ref 70–99)

## 2020-04-28 LAB — CBC
HCT: 36 % — ABNORMAL LOW (ref 39.0–52.0)
Hemoglobin: 11.7 g/dL — ABNORMAL LOW (ref 13.0–17.0)
MCH: 31.5 pg (ref 26.0–34.0)
MCHC: 32.5 g/dL (ref 30.0–36.0)
MCV: 97 fL (ref 80.0–100.0)
Platelets: 273 10*3/uL (ref 150–400)
RBC: 3.71 MIL/uL — ABNORMAL LOW (ref 4.22–5.81)
RDW: 12.8 % (ref 11.5–15.5)
WBC: 6.9 10*3/uL (ref 4.0–10.5)
nRBC: 0 % (ref 0.0–0.2)

## 2020-04-28 LAB — BASIC METABOLIC PANEL
Anion gap: 8 (ref 5–15)
BUN: 18 mg/dL (ref 8–23)
CO2: 27 mmol/L (ref 22–32)
Calcium: 9.1 mg/dL (ref 8.9–10.3)
Chloride: 100 mmol/L (ref 98–111)
Creatinine, Ser: 1.15 mg/dL (ref 0.61–1.24)
GFR calc Af Amer: >60 mL/min (ref >60)
GFR calc non Af Amer: >60 mL/min (ref >60)
Glucose, Bld: 271 mg/dL — ABNORMAL HIGH (ref 70–99)
Potassium: 5.3 mmol/L — ABNORMAL HIGH (ref 3.5–5.1)
Sodium: 135 mmol/L (ref 135–145)

## 2020-04-28 LAB — MRSA PCR SCREENING: MRSA by PCR: NEGATIVE

## 2020-04-28 SURGERY — AMPUTATION, TOE
Anesthesia: General | Site: Toe | Laterality: Left

## 2020-04-28 MED ORDER — METHOCARBAMOL 500 MG PO TABS: 500.0000 mg | ORAL_TABLET | Freq: Four times a day (QID) | ORAL | Status: DC | PRN

## 2020-04-28 MED ORDER — ONDANSETRON HCL 4 MG/2ML IJ SOLN: 4.0000 mg | Freq: Once | INTRAMUSCULAR | Status: DC | PRN

## 2020-04-28 MED ORDER — INSULIN GLARGINE 100 UNIT/ML ~~LOC~~ SOLN
10.0000 [IU] | Freq: Every day | SUBCUTANEOUS | Status: DC
  Administered 2020-04-28: 10 [IU] via SUBCUTANEOUS
  Filled 2020-04-28 (×2): qty 0.1

## 2020-04-28 MED ORDER — HYDROMORPHONE HCL 1 MG/ML IJ SOLN: 0.2500 mg | INTRAMUSCULAR | Status: DC | PRN

## 2020-04-28 MED ORDER — OXYCODONE HCL 5 MG PO TABS: 10.0000 mg | ORAL_TABLET | ORAL | Status: DC | PRN

## 2020-04-28 MED ORDER — LACTATED RINGERS IV SOLN: INTRAVENOUS | Status: DC

## 2020-04-28 MED ORDER — ONDANSETRON HCL 4 MG/2ML IJ SOLN: 4.0000 mg | Freq: Four times a day (QID) | INTRAMUSCULAR | Status: DC | PRN

## 2020-04-28 MED ORDER — FENTANYL CITRATE (PF) 100 MCG/2ML IJ SOLN
INTRAMUSCULAR | Status: DC | PRN
  Administered 2020-04-28: 25 ug via INTRAVENOUS

## 2020-04-28 MED ORDER — METHOCARBAMOL 1000 MG/10ML IJ SOLN
500.0000 mg | Freq: Four times a day (QID) | INTRAVENOUS | Status: DC | PRN
  Filled 2020-04-28: qty 5

## 2020-04-28 MED ORDER — CHLORHEXIDINE GLUCONATE 0.12 % MT SOLN
OROMUCOSAL | Status: AC
  Filled 2020-04-28: qty 15

## 2020-04-28 MED ORDER — CHLORHEXIDINE GLUCONATE 0.12 % MT SOLN
15.0000 mL | Freq: Once | OROMUCOSAL | Status: AC
  Administered 2020-04-28: 15 mL via OROMUCOSAL
  Filled 2020-04-28: qty 15

## 2020-04-28 MED ORDER — FENTANYL CITRATE (PF) 250 MCG/5ML IJ SOLN
INTRAMUSCULAR | Status: AC
  Filled 2020-04-28: qty 5

## 2020-04-28 MED ORDER — ONDANSETRON HCL 4 MG/2ML IJ SOLN
INTRAMUSCULAR | Status: DC | PRN
  Administered 2020-04-28: 4 mg via INTRAVENOUS

## 2020-04-28 MED ORDER — METOCLOPRAMIDE HCL 5 MG/ML IJ SOLN: 5.0000 mg | Freq: Three times a day (TID) | INTRAMUSCULAR | Status: DC | PRN

## 2020-04-28 MED ORDER — METOCLOPRAMIDE HCL 5 MG PO TABS: 5.0000 mg | ORAL_TABLET | Freq: Three times a day (TID) | ORAL | Status: DC | PRN

## 2020-04-28 MED ORDER — BISACODYL 10 MG RE SUPP: 10.0000 mg | Freq: Every day | RECTAL | Status: DC | PRN

## 2020-04-28 MED ORDER — POLYETHYLENE GLYCOL 3350 17 G PO PACK: 17.0000 g | PACK | Freq: Every day | ORAL | Status: DC | PRN

## 2020-04-28 MED ORDER — HYDROMORPHONE HCL 1 MG/ML IJ SOLN: 0.5000 mg | INTRAMUSCULAR | Status: DC | PRN

## 2020-04-28 MED ORDER — PROPOFOL 10 MG/ML IV BOLUS
INTRAVENOUS | Status: DC | PRN
  Administered 2020-04-28: 10 mg via INTRAVENOUS
  Administered 2020-04-28: 160 mg via INTRAVENOUS

## 2020-04-28 MED ORDER — 0.9 % SODIUM CHLORIDE (POUR BTL) OPTIME
TOPICAL | Status: DC | PRN
  Administered 2020-04-28: 1000 mL

## 2020-04-28 MED ORDER — OXYCODONE HCL 5 MG PO TABS: 5.0000 mg | ORAL_TABLET | ORAL | Status: DC | PRN

## 2020-04-28 MED ORDER — ACETAMINOPHEN 325 MG PO TABS: 325.0000 mg | ORAL_TABLET | Freq: Four times a day (QID) | ORAL | Status: DC | PRN

## 2020-04-28 MED ORDER — PHENYLEPHRINE 40 MCG/ML (10ML) SYRINGE FOR IV PUSH (FOR BLOOD PRESSURE SUPPORT)
PREFILLED_SYRINGE | INTRAVENOUS | Status: DC | PRN
  Administered 2020-04-28 (×2): 120 ug via INTRAVENOUS

## 2020-04-28 MED ORDER — MEPERIDINE HCL 25 MG/ML IJ SOLN: 6.2500 mg | INTRAMUSCULAR | Status: DC | PRN

## 2020-04-28 MED ORDER — ONDANSETRON HCL 4 MG PO TABS: 4.0000 mg | ORAL_TABLET | Freq: Four times a day (QID) | ORAL | Status: DC | PRN

## 2020-04-28 MED ORDER — LIDOCAINE 2% (20 MG/ML) 5 ML SYRINGE
INTRAMUSCULAR | Status: DC | PRN
  Administered 2020-04-28: 100 mg via INTRAVENOUS

## 2020-04-28 MED ORDER — DOCUSATE SODIUM 100 MG PO CAPS
100.0000 mg | ORAL_CAPSULE | Freq: Two times a day (BID) | ORAL | Status: DC
  Administered 2020-04-28 – 2020-04-29 (×2): 100 mg via ORAL
  Filled 2020-04-28 (×2): qty 1

## 2020-04-28 SURGICAL SUPPLY — 31 items
BLADE SURG 21 STRL SS (BLADE) ×2 IMPLANT
BNDG COHESIVE 4X5 TAN STRL (GAUZE/BANDAGES/DRESSINGS) ×2 IMPLANT
BNDG ESMARK 4X9 LF (GAUZE/BANDAGES/DRESSINGS) IMPLANT
BNDG GAUZE ELAST 4 BULKY (GAUZE/BANDAGES/DRESSINGS) ×2 IMPLANT
COVER SURGICAL LIGHT HANDLE (MISCELLANEOUS) ×2 IMPLANT
COVER WAND RF STERILE (DRAPES) IMPLANT
DRAPE U-SHAPE 47X51 STRL (DRAPES) ×2 IMPLANT
DRSG ADAPTIC 3X8 NADH LF (GAUZE/BANDAGES/DRESSINGS) IMPLANT
DRSG EMULSION OIL 3X3 NADH (GAUZE/BANDAGES/DRESSINGS) ×2 IMPLANT
DRSG PAD ABDOMINAL 8X10 ST (GAUZE/BANDAGES/DRESSINGS) IMPLANT
DURAPREP 26ML APPLICATOR (WOUND CARE) ×2 IMPLANT
ELECT REM PT RETURN 9FT ADLT (ELECTROSURGICAL) ×2
ELECTRODE REM PT RTRN 9FT ADLT (ELECTROSURGICAL) ×1 IMPLANT
GAUZE SPONGE 4X4 12PLY STRL (GAUZE/BANDAGES/DRESSINGS) IMPLANT
GAUZE SPONGE 4X4 12PLY STRL LF (GAUZE/BANDAGES/DRESSINGS) ×2 IMPLANT
GLOVE BIOGEL PI IND STRL 9 (GLOVE) ×1 IMPLANT
GLOVE BIOGEL PI INDICATOR 9 (GLOVE) ×1
GLOVE SURG ORTHO 9.0 STRL STRW (GLOVE) ×2 IMPLANT
GOWN STRL REUS W/ TWL XL LVL3 (GOWN DISPOSABLE) ×2 IMPLANT
GOWN STRL REUS W/TWL XL LVL3 (GOWN DISPOSABLE) ×4
KIT BASIN OR (CUSTOM PROCEDURE TRAY) ×2 IMPLANT
KIT TURNOVER KIT B (KITS) ×2 IMPLANT
MANIFOLD NEPTUNE II (INSTRUMENTS) IMPLANT
NEEDLE 22X1 1/2 (OR ONLY) (NEEDLE) IMPLANT
NS IRRIG 1000ML POUR BTL (IV SOLUTION) ×2 IMPLANT
PACK ORTHO EXTREMITY (CUSTOM PROCEDURE TRAY) ×2 IMPLANT
PAD ABD 8X10 STRL (GAUZE/BANDAGES/DRESSINGS) ×2 IMPLANT
PAD ARMBOARD 7.5X6 YLW CONV (MISCELLANEOUS) IMPLANT
SUT ETHILON 2 0 PSLX (SUTURE) ×2 IMPLANT
SYR CONTROL 10ML LL (SYRINGE) IMPLANT
TOWEL GREEN STERILE (TOWEL DISPOSABLE) ×2 IMPLANT

## 2020-04-28 NOTE — Op Note (Signed)
04/28/2020  5:12 PM  PATIENT:  Micheal Moore.    PRE-OPERATIVE DIAGNOSIS:  Osteomylitits left great toe.  POST-OPERATIVE DIAGNOSIS:  Same  PROCEDURE:  AMPUTATION LEFT GREAT TOE  SURGEON:  Newt Minion, MD  PHYSICIAN ASSISTANT:None ANESTHESIA:   General  PREOPERATIVE INDICATIONS:  Micheal Moore. is a  69 y.o. male with a diagnosis of Osteomylitits left great toe. who failed conservative measures and elected for surgical management.    The risks benefits and alternatives were discussed with the patient preoperatively including but not limited to the risks of infection, bleeding, nerve injury, cardiopulmonary complications, the need for revision surgery, among others, and the patient was willing to proceed.  OPERATIVE IMPLANTS: None  @ENCIMAGES @  OPERATIVE FINDINGS: Good petechial bleeding at the surgical margins no abscess  OPERATIVE PROCEDURE: Patient brought the operating room and underwent a general anesthetic.  After adequate levels anesthesia were obtained patient's left lower extremity was prepped using DuraPrep draped into a sterile field a timeout was called.  A fishmouth incision was made just distal to the MTP joint left great toe the toe was amputated through the MTP joint.  Electrocautery was used hemostasis the wound was irrigated with normal saline the incision was closed using 2-0 nylon.  A sterile dressing was applied patient was extubated taken the PACU in stable condition   DISCHARGE PLANNING:  Antibiotic duration: Continue antibiotics for 24 hours  Weightbearing: Touchdown weightbearing on the left in a postoperative shoe  Pain medication: Opioid pathway  Dressing care/ Wound VAC: Change dressing as needed  Ambulatory devices: Walker or crutches  Discharge to: Anticipate discharge to home tomorrow.  Follow-up: In the office 1 week post operative.

## 2020-04-28 NOTE — Progress Notes (Signed)
Orthopedic Tech Progress Note Patient Details:  Micheal Moore [birth year] [id number]  Ortho Devices Type of Ortho Device: Postop shoe/boot Ortho Device/Splint Location: LLE   Post Interventions Instructions Provided: Adjustment of device, Poper ambulation with device   [name] E [name] 04/28/2020, 7:07 PM

## 2020-04-28 NOTE — Progress Notes (Signed)
Attempted to call [facility] to make Dr. Sharol Given aware patient ate full meal of breakfast this morning and his surgery scheduled today. Also contacted surgery scheduler and left a voicemail.

## 2020-04-28 NOTE — Anesthesia Procedure Notes (Signed)
Procedure Name: LMA Insertion Performed by: Milford Cage, CRNA Pre-anesthesia Checklist: Patient identified, Emergency Drugs available, Suction available and Patient being monitored Patient Re-evaluated:Patient Re-evaluated prior to induction Oxygen Delivery Method: Circle System Utilized Preoxygenation: Pre-oxygenation with 100% oxygen Induction Type: IV induction LMA: LMA inserted LMA Size: 5.0 Number of attempts: 1 Airway Equipment and Method: Bite block Placement Confirmation: positive ETCO2 Tube secured with: Tape Dental Injury: Teeth and Oropharynx as per pre-operative assessment

## 2020-04-28 NOTE — Consult Note (Signed)
ORTHOPAEDIC CONSULTATION  REQUESTING PHYSICIAN: Geradine Girt, DO  Chief Complaint: Pain swelling and cellulitis left great toe.  HPI: Micheal Moore. is a 69 y.o. male who presents with uncontrolled type 2 diabetes with a history of tobacco use who denies any specific trauma but complains of swelling loss of nails cellulitis and pain of the left great toe  Past Medical History:  Diagnosis Date  . Diabetes mellitus without complication ([place])    History reviewed. No pertinent surgical history. Social History   Socioeconomic History  . Marital status: Divorced    Spouse name: Not on file  . Number of children: Not on file  . Years of education: Not on file  . Highest education level: Not on file  Occupational History  . Not on file  Tobacco Use  . Smoking status: Current Every Day Smoker  . Smokeless tobacco: Never Used  Substance and Sexual Activity  . Alcohol use: No  . Drug use: Not on file  . Sexual activity: Not on file  Other Topics Concern  . Not on file  Social History Narrative  . Not on file   Social Determinants of Health   Financial Resource Strain:   . Difficulty of Paying Living Expenses:   Food Insecurity:   . Worried About Charity fundraiser in the Last Year:   . Arboriculturist in the Last Year:   Transportation Needs:   . Film/video editor (Medical):   Marland Kitchen Lack of Transportation (Non-Medical):   Physical Activity:   . Days of Exercise per Week:   . Minutes of Exercise per Session:   Stress:   . Feeling of Stress :   Social Connections:   . Frequency of Communication with Friends and Family:   . Frequency of Social Gatherings with Friends and Family:   . Attends Religious Services:   . Active Member of Clubs or Organizations:   . Attends Archivist Meetings:   Marland Kitchen Marital Status:    No family history on file. - negative except otherwise stated in the family history section No Known Allergies Prior to Admission  medications   Medication Sig Start Date End Date Taking? Authorizing Provider  aspirin 325 MG tablet Take 325 mg by mouth at bedtime.   Yes [provider]  glimepiride (AMARYL) 2 MG tablet Take 2 mg by mouth at bedtime.  01/26/19  Yes [provider]  metFORMIN (GLUCOPHAGE) 500 MG tablet Take 500 mg by mouth 2 (two) times daily. 02/13/19  Yes [provider]  rosuvastatin (CRESTOR) 5 MG tablet Take 5 mg by mouth 2 (two) times a week. On Tuesday & Saturday 03/26/20  Yes [provider]  Rivaroxaban 15 & 20 MG TBPK Take as directed on package: Start with one 15mg  tablet by mouth twice a day with food. On Day 22, switch to one 20mg  tablet once a day with food. Patient not taking: Reported on 03/22/2019 03/18/18   Dorie Rank, MD   MR FOOT LEFT W WO CONTRAST  Result Date: 04/27/2020 CLINICAL DATA:  Pain and swelling of the left foot. Diabetic. EXAM: MRI OF THE LEFT FOREFOOT WITHOUT AND WITH CONTRAST TECHNIQUE: Multiplanar, multisequence MR imaging of the left foot was performed both before and after administration of intravenous contrast. CONTRAST:  24mL GADAVIST GADOBUTROL 1 MMOL/ML IV SOLN COMPARISON:  Radiographs 04/26/2020 FINDINGS: Marked diffuse abnormal T1 and T2 signal intensity in the distal phalanx of the great toe along  with diffuse contrast enhancement consistent with osteomyelitis. There is also a small open wound noted along the plantar and lateral aspect of the great toe with an underlying small subcutaneous abscess. Diffuse surrounding cellulitis involving the great toe and the entire forefoot. There is also diffuse myositis without findings for pyomyositis. I do not see any findings suspicious for septic arthritis. IMPRESSION: 1. Osteomyelitis involving the distal phalanx of the great toe. 2. Small open wound along the plantar and lateral aspect of the great toe with an underlying small subcutaneous abscess. 3. Diffuse cellulitis and myositis without findings for  pyomyositis or septic arthritis. Electronically Signed   By: Marijo Sanes M.D.   On: 04/27/2020 11:19   DG Foot Complete Left  Result Date: 04/26/2020 CLINICAL DATA:  First toe pain and swelling, initial encounter EXAM: LEFT FOOT - COMPLETE 3+ VIEW COMPARISON:  None. FINDINGS: Soft tissue swelling is noted in the first toe. Degenerative changes of the first MTP joint are noted. Some lucency is noted at the base of the first distal phalanx suspicious for undisplaced fracture. No definitive bony erosive changes are seen to suggest osteomyelitis. IMPRESSION: Soft tissue swelling without definitive osteomyelitis. There are changes suspicious for undisplaced first distal phalangeal fracture. Electronically Signed   By: Inez Catalina M.D.   On: 04/26/2020 09:00   - pertinent xrays, CT, MRI studies were reviewed and independently interpreted  Positive ROS: All other systems have been reviewed and were otherwise negative with the exception of those mentioned in the HPI and as above.  Physical Exam: General: Alert, no acute distress Psychiatric: Patient is competent for consent with normal mood and affect Lymphatic: No axillary or cervical lymphadenopathy Cardiovascular: No pedal edema Respiratory: No cyanosis, no use of accessory musculature GI: No organomegaly, abdomen is soft and non-tender    Images:  @ENCIMAGES @  Labs:  Lab Results  Component Value Date   HGBA1C 8.4 (H) 04/27/2020   ESRSEDRATE 53 (H) 04/27/2020   CRP 6.1 (H) 04/27/2020   REPTSTATUS 03/27/2019 FINAL 03/22/2019   CULT  03/22/2019    NO GROWTH 5 DAYS Performed at [facility], [place] [address], [place], [place] [address]     Lab Results  Component Value Date   ALBUMIN 3.2 (L) 04/26/2020   ALBUMIN 4.2 03/22/2019   PREALBUMIN 11.2 (L) 04/27/2020    Neurologic: Patient does not have protective sensation bilateral lower extremities.   MUSCULOSKELETAL:   Skin: Examination patient has cellulitis ulceration  and sausage digit swelling of the left great toe  Patient has a good dorsalis pedis pulse.  Review the MRI scan shows osteomyelitis involving the entire tuft of the great toe.  Patient's prealbumin is 11.2 hemoglobin A1c 8.4.  Assessment: Assessment: Type 2 diabetes with protein caloric malnutrition with history of smoking with osteomyelitis of the left great toe.  Plan: Plan: We will plan for amputation of the left great toe risks and benefits were discussed including risk of the wound not healing need for additional surgery.  Patient states he understands wished to proceed at this time we will plan for surgery today at 4 PM patient is to be n.p.o. he did have breakfast.  Thank you for the consult and the opportunity to see Mr. Carrell Rahmani, [place] [phone] 8:36 AM

## 2020-04-28 NOTE — Anesthesia Postprocedure Evaluation (Signed)
Anesthesia Post Note  Patient: [name].  Procedure(s) Performed: AMPUTATION LEFT GREAT TOE (Left Toe)     Patient location during evaluation: PACU Anesthesia Type: General Level of consciousness: awake and alert Pain management: pain level controlled Vital Signs Assessment: post-procedure vital signs reviewed and stable Respiratory status: spontaneous breathing, nonlabored ventilation, respiratory function stable and patient connected to nasal cannula oxygen Cardiovascular status: blood pressure returned to baseline and stable Postop Assessment: no apparent nausea or vomiting Anesthetic complications: no   No complications documented.  Last Vitals:  Vitals:   04/28/20 1745 04/28/20 1805  BP: (!) 146/80 (!) 154/84  Pulse: 79 85  Resp: 15 16  Temp:    SpO2: 97% 97%    Last Pain:  Vitals:   04/28/20 1730  TempSrc:   PainSc: 0-No pain                 Obadiah Dennard DAVID

## 2020-04-28 NOTE — Progress Notes (Signed)
Progress Note    Micheal Moore.  YWV:[id number] DOB: 1951-01-06  DOA: 04/26/2020 PCP: [name], MD    Brief Narrative:     Medical records reviewed and are as summarized below:  Micheal Moore. is an 69 y.o. male with medical history significant of diabetes mellitus type 2, hyperlipidemia, history of DVT not on anticoagulation, and tobacco abuse presents with complaints of swelling of the great toe of his left foot.  He is unsure of how he possibly injured his foot, but reports that the swelling has been present there for the last 3 to 4 weeks now.  He has been trying to elevate the foot at home and had reportedly been placing "sports alcohol" on the wound at home with attempts to try and clean it.  He reports that his dog had also been licking the area.  Notes associated symptoms of some pain of the left toe with standing, redness, drainage from the wound, and urinary frequency.  He relates urinary frequency to him being on glipizide.  Denies having any significant fever, chills, chest pain,, nausea, vomiting, or diarrhea.  Patient does not routinely check his blood sugars,  Assessment/Plan:   Principal Problem:   Osteomyelitis of great toe of left foot (HCC) Active Problems:   Cellulitis in diabetic foot (HCC)   Type 2 diabetes mellitus with hyperlipidemia (HCC)   History of DVT (deep vein thrombosis)   Tobacco abuse   Cellulitis of left foot Diabetic foot infection Osteomyelitis of 1st toe: Acute.  Patient present with complaints of a wound of the left great toe with swelling and erythema.  Unclear cause of injury.  X-ray imaging concerning for soft tissue swelling without definitive signs of osteomyelitis -Admit to MedSurg bed -Lower extremity wound order set/cellulitis focus order set utilized -elevated CRP and SED RATE -Wound care consult -Continue empiric antibiotics of Rocephin and metronidazole -Orthopedics consulted: plan for amputation  8/12  Diabetes mellitus type 2 uncontrolled: Patient presents with glucose elevated at 226.  He appears not to be monitoring blood sugars closely at home.  Home medications include Amaryl 2 mg nightly and Metformin 500 mg twice daily.   -hemoglobin A1c (8.4)  -CBGs before every meal and at bedtime with moderate SSI -Diabetic education  Hyperlipidemia: Home medication regimen includes Crestor 5 mg on Tuesdays and Saturdays. -Continue current regimen   History of DVT: Patient previously on Xarelto for unprovoked DVT in 2019., but had not been taking in over 2 months due to cost and his primary care provider placed him on 325 mg daily instead. -Continue aspirin -Doppler ultrasound of RLE shows clot but appears chronic- will wait for official read by vascular  Tobacco use: Patient still reports smoking 1/3 pack cigarettes per day on average and declines need of nicotine patch. -Counseled on the need of cessation of tobacco use   Family Communication/Anticipated D/C date and plan/Code Status   DVT prophylaxis: Lovenox ordered. Code Status: Full Code.  Disposition Plan: Status is: Inpatient  Remains inpatient appropriate because:Inpatient level of care appropriate due to severity of illness   Dispo: The patient is from: Home              Anticipated d/c is to: Home              Anticipated d/c date is: 2 days              Patient currently is not medically stable to d/c.  Medical Consultants:    ortho     Subjective:     Objective:    Vitals:   04/27/20 1817 04/27/20 2334 04/28/20 0603 04/28/20 1249  BP: (!) 141/68 120/63 127/84 139/82  Pulse: 86 89 76 83  Resp: 16 18 18 17   Temp: 98.7 F (37.1 C) 98.7 F (37.1 C) 98.5 F (36.9 C) 98.4 F (36.9 C)  TempSrc: Oral Oral Oral Oral  SpO2: 98% 96% 96% 97%  Weight:      Height:        Intake/Output Summary (Last 24 hours) at 04/28/2020 1600 Last data filed at 04/28/2020 0715 Gross per 24 hour   Intake 940 ml  Output --  Net 940 ml   Filed Weights   04/26/20 0832  Weight: 93 kg    Exam: In surgery  Data Reviewed:   I have personally reviewed following labs and imaging studies:  Labs: Labs show the following:   Basic Metabolic Panel: Recent Labs  Lab 04/26/20 0848 04/28/20 0542  NA 137 135  K 4.4 5.3*  CL 103 100  CO2 24 27  GLUCOSE 226* 271*  BUN 17 18  CREATININE 1.07 1.15  CALCIUM 9.1 9.1   GFR Estimated Creatinine Clearance: 76.4 mL/min (by C-G formula based on SCr of 1.15 mg/dL). Liver Function Tests: Recent Labs  Lab 04/26/20 0848  AST 20  ALT 20  ALKPHOS 56  BILITOT 0.5  PROT 6.6  ALBUMIN 3.2*   No results for input(s): LIPASE, AMYLASE in the last 168 hours. No results for input(s): AMMONIA in the last 168 hours. Coagulation profile Recent Labs  Lab 04/26/20 0848  INR 1.0    CBC: Recent Labs  Lab 04/26/20 0848 04/28/20 0542  WBC 9.5 6.9  NEUTROABS 6.2  --   HGB 12.0* 11.7*  HCT 37.9* 36.0*  MCV 99.5 97.0  PLT 276 273   Cardiac Enzymes: No results for input(s): CKTOTAL, CKMB, CKMBINDEX, TROPONINI in the last 168 hours. BNP (last 3 results) No results for input(s): PROBNP in the last 8760 hours. CBG: Recent Labs  Lab 04/27/20 1609 04/27/20 2139 04/28/20 0606 04/28/20 1118 04/28/20 1512  GLUCAP 251* 302* 248* 288* 93   D-Dimer: No results for input(s): DDIMER in the last 72 hours. Hgb A1c: Recent Labs    04/27/20 1122  HGBA1C 8.4*   Lipid Profile: No results for input(s): CHOL, HDL, LDLCALC, TRIG, CHOLHDL, LDLDIRECT in the last 72 hours. Thyroid function studies: No results for input(s): TSH, T4TOTAL, T3FREE, THYROIDAB in the last 72 hours.  Invalid input(s): FREET3 Anemia work up: No results for input(s): VITAMINB12, FOLATE, FERRITIN, TIBC, IRON, RETICCTPCT in the last 72 hours. Sepsis Labs: Recent Labs  Lab 04/26/20 0848 04/28/20 0542  WBC 9.5 6.9  LATICACIDVEN 1.1  --     Microbiology Recent  Results (from the past 240 hour(s))  Blood Cultures x 2 sites     Status: None (Preliminary result)   Collection Time: 04/27/20  9:00 AM   Specimen: BLOOD  Result Value Ref Range Status   Specimen Description BLOOD SITE NOT SPECIFIED  Final   Special Requests   Final    BOTTLES DRAWN AEROBIC AND ANAEROBIC Blood Culture results may not be optimal due to an excessive volume of blood received in culture bottles   Culture   Final    NO GROWTH < 24 HOURS Performed at [facility], [place], [place], [place] [address]    Report Status PENDING  Incomplete  Blood Cultures x 2 sites     Status: None (Preliminary result)   Collection Time: 04/27/20  9:04 AM   Specimen: BLOOD  Result Value Ref Range Status   Specimen Description BLOOD SITE NOT SPECIFIED  Final   Special Requests   Final    BOTTLES DRAWN AEROBIC AND ANAEROBIC Blood Culture results may not be optimal due to an excessive volume of blood received in culture bottles   Culture   Final    NO GROWTH < 24 HOURS Performed at [facility], [address], [place], [place] [address]    Report Status PENDING  Incomplete  SARS Coronavirus 2 by RT PCR (hospital order, performed in [facility] hospital lab) Nasopharyngeal Nasopharyngeal Swab     Status: None   Collection Time: 04/27/20  9:35 AM   Specimen: Nasopharyngeal Swab  Result Value Ref Range Status   SARS Coronavirus 2 NEGATIVE NEGATIVE Final    Comment: (NOTE) SARS-CoV-2 target nucleic acids are NOT DETECTED.  The SARS-CoV-2 RNA is generally detectable in upper and lower respiratory specimens during the acute phase of infection. The lowest concentration of SARS-CoV-2 viral copies this assay can detect is 250 copies / mL. A negative result does not preclude SARS-CoV-2 infection and should not be used as the sole basis for treatment or other patient management decisions.  A negative result may occur with improper specimen collection / handling, submission of  specimen other than nasopharyngeal swab, presence of viral mutation(s) within the areas targeted by this assay, and inadequate number of viral copies (<250 copies / mL). A negative result must be combined with clinical observations, patient history, and epidemiological information.  Fact Sheet for Patients:   StrictlyIdeas.no  Fact Sheet for Healthcare Providers: BankingDealers.co.za  This test is not yet approved or  cleared by the [place] FDA and has been authorized for detection and/or diagnosis of SARS-CoV-2 by FDA under an Emergency Use Authorization (EUA).  This EUA will remain in effect (meaning this test can be used) for the duration of the COVID-19 declaration under Section 564(b)(1) of the Act, 21 U.S.C. section 360bbb-3(b)(1), unless the authorization is terminated or revoked sooner.  Performed at [facility], [place] [address] [place], [place] [address]   MRSA PCR Screening     Status: None   Collection Time: 04/28/20 11:42 AM   Specimen: Nasopharyngeal  Result Value Ref Range Status   MRSA by PCR NEGATIVE NEGATIVE Final    Comment:        The GeneXpert MRSA Assay (FDA approved for NASAL specimens only), is one component of a comprehensive MRSA colonization surveillance program. It is not intended to diagnose MRSA infection nor to guide or monitor treatment for MRSA infections. Performed at [facility], [place] [address], [place], [place] [address]     Procedures and diagnostic studies:  MR FOOT LEFT W WO CONTRAST  Result Date: 04/27/2020 CLINICAL DATA:  Pain and swelling of the left foot. Diabetic. EXAM: MRI OF THE LEFT FOREFOOT WITHOUT AND WITH CONTRAST TECHNIQUE: Multiplanar, multisequence MR imaging of the left foot was performed both before and after administration of intravenous contrast. CONTRAST:  79mL GADAVIST GADOBUTROL 1 MMOL/ML IV SOLN COMPARISON:  Radiographs 04/26/2020 FINDINGS: Marked  diffuse abnormal T1 and T2 signal intensity in the distal phalanx of the great toe along with diffuse contrast enhancement consistent with osteomyelitis. There is also a small open wound noted along the plantar and lateral aspect of the great toe with  an underlying small subcutaneous abscess. Diffuse surrounding cellulitis involving the great toe and the entire forefoot. There is also diffuse myositis without findings for pyomyositis. I do not see any findings suspicious for septic arthritis. IMPRESSION: 1. Osteomyelitis involving the distal phalanx of the great toe. 2. Small open wound along the plantar and lateral aspect of the great toe with an underlying small subcutaneous abscess. 3. Diffuse cellulitis and myositis without findings for pyomyositis or septic arthritis. Electronically Signed   By: Marijo Sanes M.D.   On: 04/27/2020 11:19   VAS [place] ABI WITH/WO TBI  Result Date: 04/28/2020 LOWER EXTREMITY DOPPLER STUDY Indications: Ulceration. High Risk Factors: Diabetes.  Limitations: Today's exam was limited due to LT toe wound/ulceration and RT LE              DVT. Comparison Study: No prior studies. Performing Technologist: Darlin Coco  Examination Guidelines: A complete evaluation includes at minimum, Doppler waveform signals and systolic blood pressure reading at the level of bilateral brachial, anterior tibial, and posterior tibial arteries, when vessel segments are accessible. Bilateral testing is considered an integral part of a complete examination. Photoelectric Plethysmograph (PPG) waveforms and toe systolic pressure readings are included as required and additional duplex testing as needed. Limited examinations for reoccurring indications may be performed as noted.  ABI Findings: +---------+------------------+-----+---------+---------------------------------+ Right    Rt Pressure (mmHg)IndexWaveform Comment                            +---------+------------------+-----+---------+---------------------------------+ Brachial 126                    triphasic                                  +---------+------------------+-----+---------+---------------------------------+ PTA                             triphasicUnable to obtain pressures-                                                patient postive for DVT in RT LE  +---------+------------------+-----+---------+---------------------------------+ DP                              triphasicUnable to obtain pressures-                                                patient postive for DVT in RT LE  +---------+------------------+-----+---------+---------------------------------+ Great Toe                       Abnormal                                   +---------+------------------+-----+---------+---------------------------------+ +---------+------------------+-----+---------+---------------------------------+ Left     Lt Pressure (mmHg)IndexWaveform Comment                           +---------+------------------+-----+---------+---------------------------------+ Brachial 125  triphasic                                  +---------+------------------+-----+---------+---------------------------------+ PTA      139               1.10 triphasic                                  +---------+------------------+-----+---------+---------------------------------+ DP       135               1.07 triphasic                                  +---------+------------------+-----+---------+---------------------------------+ Great Toe                       Normal   Unable to obtain pressures due to                                          toe ulceration                    +---------+------------------+-----+---------+---------------------------------+ +-------+-----------+-----------+------------+------------+ ABI/TBIToday's  ABIToday's TBIPrevious ABIPrevious TBI +-------+-----------+-----------+------------+------------+ Left   1.10                                           +-------+-----------+-----------+------------+------------+  Summary: Right: Waveforms at the ankle appear normal. Waveform at the toe appears abnormal. Left: Resting left ankle-brachial index is within normal range. No evidence of significant left lower extremity arterial disease.  *See table(s) above for measurements and observations.     Preliminary    VAS [place] LOWER EXTREMITY VENOUS (DVT)  Result Date: 04/28/2020  Lower Venous DVTStudy Indications: Swelling. Other Indications: History of DVT. Limitations: Poor ultrasound/tissue interface. Comparison Study: 03-18-2018 LEV RT was positive for DVT Performing Technologist: Darlin Coco  Examination Guidelines: A complete evaluation includes B-mode imaging, spectral Doppler, color Doppler, and power Doppler as needed of all accessible portions of each vessel. Bilateral testing is considered an integral part of a complete examination. Limited examinations for reoccurring indications may be performed as noted. The reflux portion of the exam is performed with the patient in reverse Trendelenburg.  +---------+---------------+---------+-----------+----------------+-------------+ RIGHT    CompressibilityPhasicitySpontaneityProperties      Thrombus                                                                  Aging         +---------+---------------+---------+-----------+----------------+-------------+ CFV      Full           Yes      Yes                                      +---------+---------------+---------+-----------+----------------+-------------+ SFJ  Full                                                             +---------+---------------+---------+-----------+----------------+-------------+ FV Prox  None                                               Chronic        +---------+---------------+---------+-----------+----------------+-------------+ FV Mid   None                               partially       Chronic                                                   re-cannalized                 +---------+---------------+---------+-----------+----------------+-------------+ FV DistalNone                                               Chronic       +---------+---------------+---------+-----------+----------------+-------------+ PFV      Full                                                             +---------+---------------+---------+-----------+----------------+-------------+ POP      Partial                                            Chronic       +---------+---------------+---------+-----------+----------------+-------------+ PTV      Full                                                             +---------+---------------+---------+-----------+----------------+-------------+ PERO                                                        Patent by  color- some                                                               segments not                                                              visualized    +---------+---------------+---------+-----------+----------------+-------------+   +---------+---------------+---------+-----------+----------+-------------------+ LEFT     CompressibilityPhasicitySpontaneityPropertiesThrombus Aging      +---------+---------------+---------+-----------+----------+-------------------+ CFV      Full           Yes      Yes                                      +---------+---------------+---------+-----------+----------+-------------------+ SFJ      Full                                                             +---------+---------------+---------+-----------+----------+-------------------+  FV Prox  Full                                                             +---------+---------------+---------+-----------+----------+-------------------+ FV Mid   Full                                                             +---------+---------------+---------+-----------+----------+-------------------+ FV DistalFull                                                             +---------+---------------+---------+-----------+----------+-------------------+ PFV      Full                                                             +---------+---------------+---------+-----------+----------+-------------------+ POP      Full           Yes      Yes                                      +---------+---------------+---------+-----------+----------+-------------------+  PTV      Full                                                             +---------+---------------+---------+-----------+----------+-------------------+ PERO                                                  Patent by color-                                                          some segments not                                                         viisualized         +---------+---------------+---------+-----------+----------+-------------------+   Summary: RIGHT: - Findings consistent with 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 - No cystic structure found in the popliteal fossa.  LEFT: - There is no evidence of deep vein thrombosis in the lower extremity.  - No cystic structure found in the popliteal fossa.  *See table(s) above for measurements and observations.    Preliminary     Medications:   . [MAR Hold] aspirin  325 mg Oral QHS  . chlorhexidine      . [MAR Hold] enoxaparin (LOVENOX) injection  40 mg Subcutaneous Daily  . [MAR Hold] insulin aspart  0-15 Units Subcutaneous TID WC  . [MAR Hold] insulin aspart  0-5 Units  Subcutaneous QHS  . [MAR Hold] metroNIDAZOLE  500 mg Oral Q8H  . [MAR Hold] rosuvastatin  5 mg Oral Once per day on Tue Sat  . [MAR Hold] sodium chloride flush  3 mL Intravenous Q12H   Continuous Infusions: . [MAR Hold] cefTRIAXone (ROCEPHIN)  IV 2 g (04/27/20 1835)  . lactated ringers 10 mL/hr at 04/28/20 1547     LOS: 1 day   Geradine Girt  [facility]   How to contact the [facility] Attending or Consulting provider [place] or covering provider during after hours [place], for this patient?  1. Check the care team in [facility] and look for a) attending/consulting [facility] provider listed and b) the [facility] team listed 2. Log into [email or web address] and use [place]'s universal password to access. If you do not have the password, please contact the hospital operator. 3. Locate the [facility] provider you are looking for under [facility] and page to a number that you can be directly reached. 4. If you still have difficulty reaching the provider, please page the [facility] (Director on Call) for the Hospitalists listed on amion for assistance.  04/28/2020, 4:00 PM

## 2020-04-28 NOTE — Progress Notes (Signed)
Lower extremity venous bilateral and ABI studies completed  Preliminary results relayed to [facility], DO.  See CV Proc for preliminary results report.   Darlin Coco

## 2020-04-28 NOTE — Transfer of Care (Signed)
Immediate Anesthesia Transfer of Care Note  Patient: Wenzel Backlund.  Procedure(s) Performed: AMPUTATION LEFT GREAT TOE (Left Toe)  Patient Location: PACU  Anesthesia Type:General  Level of Consciousness: awake  Airway & Oxygen Therapy: Patient Spontanous Breathing  Post-op Assessment: Report given to RN and Post -op Vital signs reviewed and stable  Post vital signs: Reviewed and stable  Last Vitals:  Vitals Value Taken Time  BP 123/76 04/28/20 1700  Temp 36.3 C 04/28/20 1700  Pulse 82 04/28/20 1702  Resp 10 04/28/20 1702  SpO2 98 % 04/28/20 1702  Vitals shown include unvalidated device data.  Last Pain:  Vitals:   04/28/20 1544  TempSrc:   PainSc: 0-No pain         Complications: No complications documented.

## 2020-04-28 NOTE — Anesthesia Preprocedure Evaluation (Signed)
Anesthesia Evaluation  Patient identified by MRN, date of birth, ID band Patient awake    Reviewed: Allergy & Precautions, NPO status , Patient's Chart, lab work & pertinent test results  Airway Mallampati: I  TM Distance: >3 FB Neck ROM: Full    Dental   Pulmonary Current Smoker and Patient abstained from smoking.,    Pulmonary exam normal        Cardiovascular Normal cardiovascular exam     Neuro/Psych    GI/Hepatic   Endo/Other  diabetes, Type 2  Renal/GU      Musculoskeletal   Abdominal   Peds  Hematology   Anesthesia Other Findings   Reproductive/Obstetrics                             Anesthesia Physical Anesthesia Plan  ASA: II  Anesthesia Plan: General   Post-op Pain Management:    Induction: Intravenous  PONV Risk Score and Plan: 1 and Ondansetron  Airway Management Planned: LMA  Additional Equipment:   Intra-op Plan:   Post-operative Plan: Extubation in OR  Informed Consent: I have reviewed the patients History and Physical, chart, labs and discussed the procedure including the risks, benefits and alternatives for the proposed anesthesia with the patient or authorized representative who has indicated his/her understanding and acceptance.       Plan Discussed with: CRNA and Surgeon  Anesthesia Plan Comments:         Anesthesia Quick Evaluation

## 2020-04-29 ENCOUNTER — Encounter (HOSPITAL_COMMUNITY): Payer: Self-pay | Admitting: Orthopedic Surgery

## 2020-04-29 LAB — CBC
HCT: 37.2 % — ABNORMAL LOW (ref 39.0–52.0)
Hemoglobin: 11.9 g/dL — ABNORMAL LOW (ref 13.0–17.0)
MCH: 31.1 pg (ref 26.0–34.0)
MCHC: 32 g/dL (ref 30.0–36.0)
MCV: 97.1 fL (ref 80.0–100.0)
Platelets: 278 10*3/uL (ref 150–400)
RBC: 3.83 MIL/uL — ABNORMAL LOW (ref 4.22–5.81)
RDW: 12.7 % (ref 11.5–15.5)
WBC: 8.6 10*3/uL (ref 4.0–10.5)
nRBC: 0 % (ref 0.0–0.2)

## 2020-04-29 LAB — BASIC METABOLIC PANEL
Anion gap: 10 (ref 5–15)
BUN: 20 mg/dL (ref 8–23)
CO2: 26 mmol/L (ref 22–32)
Calcium: 9.1 mg/dL (ref 8.9–10.3)
Chloride: 98 mmol/L (ref 98–111)
Creatinine, Ser: 1.07 mg/dL (ref 0.61–1.24)
GFR calc Af Amer: >60 mL/min (ref >60)
GFR calc non Af Amer: >60 mL/min (ref >60)
Glucose, Bld: 232 mg/dL — ABNORMAL HIGH (ref 70–99)
Potassium: 5.2 mmol/L — ABNORMAL HIGH (ref 3.5–5.1)
Sodium: 134 mmol/L — ABNORMAL LOW (ref 135–145)

## 2020-04-29 LAB — GLUCOSE, CAPILLARY
Glucose-Capillary: 214 mg/dL — ABNORMAL HIGH (ref 70–99)
Glucose-Capillary: 243 mg/dL — ABNORMAL HIGH (ref 70–99)

## 2020-04-29 MED ORDER — METFORMIN HCL 500 MG PO TABS: 1000.0000 mg | ORAL_TABLET | Freq: Two times a day (BID) | ORAL | 0 refills | Status: DC

## 2020-04-29 NOTE — Progress Notes (Signed)
Pt d/c home. PIV d/c. Discharge information given. All questions answered.

## 2020-04-29 NOTE — TOC Initial Note (Signed)
Transition of Care (TOC) - Initial/Assessment Note    Patient Details  Name: Micheal Moore. MRN: [id number] Date of Birth: 24-Nov-1950  Transition of Care [facility]) CM/SW Contact:    Pollie Friar, RN Phone Number: 04/29/2020, 1:34 PM  Clinical Narrative:                 Pt lives with a friend and her aunt. He states the friend can provide the support he needs at home.  Recommendations for [facility] services. CM will arrange this for home.  CM will have [employer] deliver needed DME to the room.  CM consulted for medication assistance. Pt says he doesn't have any issues with current medications. Pt states he cant afford Xarelto at $45/ month. Pt has been changed to aspirin.  Pt has transport home.  Expected Discharge Plan: [place] Barriers to Discharge: Continued Medical Work up   Patient Goals and CMS Choice   CMS Medicare.gov Compare Post Acute Care list provided to:: Patient Choice offered to / list presented to : Patient  Expected Discharge Plan and Services Expected Discharge Plan: [place]   Discharge Planning Services: CM Consult Post Acute Care Choice: [place] arrangements for the past 2 months: Single Family Home                           HH Arranged: PT          Prior Living Arrangements/Services Living arrangements for the past 2 months: Single Family Home Lives with:: Friends Patient language and need for interpreter reviewed:: Yes Do you feel safe going back to the place where you live?: Yes      Need for Family Participation in Patient Care: Yes (Comment) Care giver support system in place?: Yes (comment)   Criminal Activity/Legal Involvement Pertinent to Current Situation/Hospitalization: No - Comment as needed  Activities of Daily Living Home Assistive Devices/Equipment: None ADL Screening (condition at time of admission) Patient's cognitive ability adequate to safely complete daily  activities?: Yes Is the patient deaf or have difficulty hearing?: No Does the patient have difficulty seeing, even when wearing glasses/contacts?: No Does the patient have difficulty concentrating, remembering, or making decisions?: No Patient able to express need for assistance with ADLs?: Yes Does the patient have difficulty dressing or bathing?: No Independently performs ADLs?: Yes (appropriate for developmental age) Does the patient have difficulty walking or climbing stairs?: No Weakness of Legs: None Weakness of Arms/Hands: None  Permission Sought/Granted                  Emotional Assessment Appearance:: Appears stated age Attitude/Demeanor/Rapport: Engaged Affect (typically observed): Accepting Orientation: : Oriented to Self, Oriented to Place, Oriented to  Time, Oriented to Situation   Psych Involvement: No (comment)  Admission diagnosis:  Infection [B99.9] Diabetic foot infection ([place]) [I14.431, L08.9] Patient Active Problem List   Diagnosis Date Noted  . Cellulitis in diabetic foot ([place]) 04/27/2020  . Osteomyelitis of great toe of left foot ([place]) 04/27/2020  . Type 2 diabetes mellitus with hyperlipidemia ([place]) 04/27/2020  . History of DVT (deep vein thrombosis) 04/27/2020  . Tobacco abuse 04/27/2020   PCP:  [name], MD Pharmacy:   [facility] [place], [place] [address] Phone: [phone] Fax: [phone]  [place] ([place]),  [place] - [address] [place] ([place]) [place] [address] Phone: [phone] Fax: [phone]     Social Determinants of Health (SDOH) Interventions    Readmission Risk Interventions No flowsheet data found.

## 2020-04-29 NOTE — Plan of Care (Signed)

## 2020-04-29 NOTE — Discharge Summary (Signed)
Physician Discharge Summary  [name]. YFV:[id number] DOB: [birth year] DOA: 04/26/2020  PCP: [name], MD  Admit date: 04/26/2020 Discharge date: 04/29/2020  Admitted From: home Discharge disposition: home   Recommendations for Outpatient Follow-Up:   1. titration of DM emds 2. BMP 1 week re: K   Discharge Diagnosis:   Principal Problem:   Osteomyelitis of great toe of left foot (HCC) Active Problems:   Cellulitis in diabetic foot (HCC)   Type 2 diabetes mellitus with hyperlipidemia (HCC)   History of DVT (deep vein thrombosis)   Tobacco abuse    Discharge Condition: Improved.  Diet recommendation: Carbohydrate-modified/low K diet  Wound care: follow up with ortho  Code status: Full.   History of Present Illness:   Micheal Moore. is a 69 y.o. male with medical history significant of diabetes mellitus type 2, hyperlipidemia, history of DVT not on anticoagulation, and tobacco abuse presents with complaints of swelling of the great toe of his left foot.  He is unsure of how he possibly injured his foot, but reports that the swelling has been present there for the last 3 to 4 weeks now.  He has been trying to elevate the foot at home and had reportedly been placing "sports alcohol" on the wound at home with attempts to try and clean it.  He reports that his dog had also been licking the area.  Notes associated symptoms of some pain of the left toe with standing, redness, drainage from the wound, and urinary frequency.  He relates urinary frequency to him being on glipizide.  Denies having any significant fever, chills, chest pain,, nausea, vomiting, or diarrhea.  Patient does not routinely check his blood sugars,.   Hospital Course by Problem:   Cellulitis of left foot Diabetic foot infection Osteomyelitis of 1st toe: Acute. Patient present with complaints of a wound of the left great toe with swelling and erythema. -Lower extremity  wound order set/cellulitis focus order set utilized -elevated CRP and SED RATE -Continue empiric antibiotics of Rocephin and metronidazole x 24 hours post surgery -Orthopedicsconsulted: s/p amputation 8/12  Diabetes mellitus type 2 uncontrolled:Patient presents with glucose elevated at 226. He appears not to be monitoring blood sugars closely at home. Home medications include Amaryl 2 mg nightly and Metformin 500 mg twice daily.  -hemoglobin A1c(8.4) -adjust metformin up, close outpatient follow up  hyperkalemia -prob RTA from DM -instructed to avoid foods high in K -recheck BMP 1 week  Hyperlipidemia: Home medication regimen includes Crestor 5 mg on Tuesdays and Saturdays. -Continue current regimen   History of DVT: Patient previously on Xareltofor unprovoked DVT in 2019., but had not been taking in over 2 months due to cost and his primary care provider placed him on 325 mg daily instead. -Continue aspirin -Doppler ultrasound of RLE shows clot but appears chronic -will defer to PCP re-start of xarelto: 45$/month co pay  Tobacco use: Patient still reports smoking1/3pack cigarettes per day on average and declines need of nicotine patch. -Counselled on the need of cessation of tobacco use    Medical Consultants:   ortho   Discharge Exam:   Vitals:   04/29/20 0559 04/29/20 1231  BP: 121/76 124/69  Pulse: 76 79  Resp: 16 16  Temp: 98.6 F (37 C) 98.6 F (37 C)  SpO2: 97% 100%   Vitals:   04/28/20 1805 04/29/20 0038 04/29/20 0559 04/29/20 1231  BP: (!) 154/84 123/68 121/76 124/69  Pulse: 85 75  76 79  Resp: 16  16 16   Temp:  98.6 F (37 C) 98.6 F (37 C) 98.6 F (37 C)  TempSrc:  Oral Oral Oral  SpO2: 97% 97% 97% 100%  Weight:      Height:        General exam: Appears calm and comfortable..    The results of significant diagnostics from this hospitalization (including imaging, microbiology, ancillary and laboratory) are listed below for  reference.     Procedures and Diagnostic Studies:   MR FOOT LEFT W WO CONTRAST  Result Date: 04/27/2020 CLINICAL DATA:  Pain and swelling of the left foot. Diabetic. EXAM: MRI OF THE LEFT FOREFOOT WITHOUT AND WITH CONTRAST TECHNIQUE: Multiplanar, multisequence MR imaging of the left foot was performed both before and after administration of intravenous contrast. CONTRAST:  55mL GADAVIST GADOBUTROL 1 MMOL/ML IV SOLN COMPARISON:  Radiographs 04/26/2020 FINDINGS: Marked diffuse abnormal T1 and T2 signal intensity in the distal phalanx of the great toe along with diffuse contrast enhancement consistent with osteomyelitis. There is also a small open wound noted along the plantar and lateral aspect of the great toe with an underlying small subcutaneous abscess. Diffuse surrounding cellulitis involving the great toe and the entire forefoot. There is also diffuse myositis without findings for pyomyositis. I do not see any findings suspicious for septic arthritis. IMPRESSION: 1. Osteomyelitis involving the distal phalanx of the great toe. 2. Small open wound along the plantar and lateral aspect of the great toe with an underlying small subcutaneous abscess. 3. Diffuse cellulitis and myositis without findings for pyomyositis or septic arthritis. Electronically Signed   By: Marijo Sanes M.D.   On: 04/27/2020 11:19   DG Foot Complete Left  Result Date: 04/26/2020 CLINICAL DATA:  First toe pain and swelling, initial encounter EXAM: LEFT FOOT - COMPLETE 3+ VIEW COMPARISON:  None. FINDINGS: Soft tissue swelling is noted in the first toe. Degenerative changes of the first MTP joint are noted. Some lucency is noted at the base of the first distal phalanx suspicious for undisplaced fracture. No definitive bony erosive changes are seen to suggest osteomyelitis. IMPRESSION: Soft tissue swelling without definitive osteomyelitis. There are changes suspicious for undisplaced first distal phalangeal fracture. Electronically  Signed   By: Inez Catalina M.D.   On: 04/26/2020 09:00     Labs:   Basic Metabolic Panel: Recent Labs  Lab 04/26/20 0848 04/26/20 0848 04/28/20 0542 04/29/20 0620  NA 137  --  135 134*  K 4.4   < > 5.3* 5.2*  CL 103  --  100 98  CO2 24  --  27 26  GLUCOSE 226*  --  271* 232*  BUN 17  --  18 20  CREATININE 1.07  --  1.15 1.07  CALCIUM 9.1  --  9.1 9.1   < > = values in this interval not displayed.   GFR Estimated Creatinine Clearance: 82.1 mL/min (by C-G formula based on SCr of 1.07 mg/dL). Liver Function Tests: Recent Labs  Lab 04/26/20 0848  AST 20  ALT 20  ALKPHOS 56  BILITOT 0.5  PROT 6.6  ALBUMIN 3.2*   No results for input(s): LIPASE, AMYLASE in the last 168 hours. No results for input(s): AMMONIA in the last 168 hours. Coagulation profile Recent Labs  Lab 04/26/20 0848  INR 1.0    CBC: Recent Labs  Lab 04/26/20 0848 04/28/20 0542 04/29/20 0620  WBC 9.5 6.9 8.6  NEUTROABS 6.2  --   --  HGB 12.0* 11.7* 11.9*  HCT 37.9* 36.0* 37.2*  MCV 99.5 97.0 97.1  PLT 276 273 278   Cardiac Enzymes: No results for input(s): CKTOTAL, CKMB, CKMBINDEX, TROPONINI in the last 168 hours. BNP: Invalid input(s): POCBNP CBG: Recent Labs  Lab 04/28/20 1824 04/28/20 1839 04/28/20 2108 04/29/20 0601 04/29/20 1113  GLUCAP 127* 141* 245* 214* 243*   D-Dimer No results for input(s): DDIMER in the last 72 hours. Hgb A1c Recent Labs    04/27/20 1122  HGBA1C 8.4*   Lipid Profile No results for input(s): CHOL, HDL, LDLCALC, TRIG, CHOLHDL, LDLDIRECT in the last 72 hours. Thyroid function studies No results for input(s): TSH, T4TOTAL, T3FREE, THYROIDAB in the last 72 hours.  Invalid input(s): FREET3 Anemia work up No results for input(s): VITAMINB12, FOLATE, FERRITIN, TIBC, IRON, RETICCTPCT in the last 72 hours. Microbiology Recent Results (from the past 240 hour(s))  Blood Cultures x 2 sites     Status: None (Preliminary result)   Collection Time: 04/27/20   9:00 AM   Specimen: BLOOD  Result Value Ref Range Status   Specimen Description BLOOD SITE NOT SPECIFIED  Final   Special Requests   Final    BOTTLES DRAWN AEROBIC AND ANAEROBIC Blood Culture results may not be optimal due to an excessive volume of blood received in culture bottles   Culture   Final    NO GROWTH 2 DAYS Performed at [facility], [place] [address] [place], [place] [address]    Report Status PENDING  Incomplete  Blood Cultures x 2 sites     Status: None (Preliminary result)   Collection Time: 04/27/20  9:04 AM   Specimen: BLOOD  Result Value Ref Range Status   Specimen Description BLOOD SITE NOT SPECIFIED  Final   Special Requests   Final    BOTTLES DRAWN AEROBIC AND ANAEROBIC Blood Culture results may not be optimal due to an excessive volume of blood received in culture bottles   Culture   Final    NO GROWTH 2 DAYS Performed at [facility], [place] [address] [place], [place] [address]    Report Status PENDING  Incomplete  SARS Coronavirus 2 by RT PCR (hospital order, performed in [facility] hospital lab) Nasopharyngeal Nasopharyngeal Swab     Status: None   Collection Time: 04/27/20  9:35 AM   Specimen: Nasopharyngeal Swab  Result Value Ref Range Status   SARS Coronavirus 2 NEGATIVE NEGATIVE Final    Comment: (NOTE) SARS-CoV-2 target nucleic acids are NOT DETECTED.  The SARS-CoV-2 RNA is generally detectable in upper and lower respiratory specimens during the acute phase of infection. The lowest concentration of SARS-CoV-2 viral copies this assay can detect is 250 copies / mL. A negative result does not preclude SARS-CoV-2 infection and should not be used as the sole basis for treatment or other patient management decisions.  A negative result may occur with improper specimen collection / handling, submission of specimen other than nasopharyngeal swab, presence of viral mutation(s) within the areas targeted by this assay, and inadequate number of  viral copies (<250 copies / mL). A negative result must be combined with clinical observations, patient history, and epidemiological information.  Fact Sheet for Patients:   StrictlyIdeas.no  Fact Sheet for Healthcare Providers: BankingDealers.co.za  This test is not yet approved or  cleared by the [place] FDA and has been authorized for detection and/or diagnosis of SARS-CoV-2 by FDA under an Emergency Use Authorization (EUA).  This EUA will  remain in effect (meaning this test can be used) for the duration of the COVID-19 declaration under Section 564(b)(1) of the Act, 21 U.S.C. section 360bbb-3(b)(1), unless the authorization is terminated or revoked sooner.  Performed at [facility], [place] [address], [place], [place] [address]   MRSA PCR Screening     Status: None   Collection Time: 04/28/20 11:42 AM   Specimen: Nasopharyngeal  Result Value Ref Range Status   MRSA by PCR NEGATIVE NEGATIVE Final    Comment:        The GeneXpert MRSA Assay (FDA approved for NASAL specimens only), is one component of a comprehensive MRSA colonization surveillance program. It is not intended to diagnose MRSA infection nor to guide or monitor treatment for MRSA infections. Performed at [facility], [place] [address] [place], [place] [address]      Discharge Instructions:   Discharge Instructions    Diet Carb Modified   Complete by: As directed    Discharge instructions   Complete by: As directed    BMP 1 week Touchdown weightbearing on the left in a postoperative shoe   Increase activity slowly   Complete by: As directed    Leave dressing on - Keep it clean, dry, and intact until clinic visit   Complete by: As directed    Touch down weight bearing   Complete by: As directed    Laterality: left   Extremity: Lower     Allergies as of 04/29/2020   No Known Allergies     Medication List    STOP taking these  medications   Rivaroxaban Stater Pack (15 mg and 20 mg) Commonly known as: XARELTO STARTER PACK     TAKE these medications   aspirin 325 MG tablet Take 325 mg by mouth at bedtime.   glimepiride 2 MG tablet Commonly known as: AMARYL Take 2 mg by mouth at bedtime.   metFORMIN 500 MG tablet Commonly known as: GLUCOPHAGE Take 2 tablets (1,000 mg total) by mouth 2 (two) times daily. What changed: how much to take   rosuvastatin 5 MG tablet Commonly known as: CRESTOR Take 5 mg by mouth 2 (two) times a week. On Tuesday & Saturday            Durable Medical Equipment  (From admission, onward)         Start     Ordered   04/29/20 1340  For home use only DME Walker rolling  Once       Question Answer Comment  Walker: With 5 Inch Wheels   Patient needs a walker to treat with the following condition Amputation of left great toe ([place])      04/29/20 1340           Discharge Care Instructions  (From admission, onward)         Start     Ordered   04/29/20 0000  Leave dressing on - Keep it clean, dry, and intact until clinic visit        04/29/20 1351   04/28/20 0000  Touch down weight bearing       Question Answer Comment  Laterality left   Extremity Lower      04/28/20 1715          Follow-up Information    Newt Minion, MD In 1 week.   Specialty: Orthopedic Surgery Contact information: [address] [place] [address] [phone]        [place],  Annie Main, MD Follow up in 1 week(s).   Specialty: Endocrinology Why: for adjustment of diabetic medications-- please bring log of daily blood sugars Contact information: [place] [address] [place], [facility] Follow up.   Specialty: Home Health Services Why: The home health agency will contact you for the first home visit.  Contact information: [address] [place] [address] [phone]                Time coordinating  discharge: 35 min  Signed:  Geradine Girt DO  [facility] 04/29/2020, 1:51 PM

## 2020-04-29 NOTE — TOC Transition Note (Signed)
Transition of Care [facility]) - CM/SW Discharge Note   Patient Details  Name: [name]. MRN: [id number] Date of Birth: 05/10/1951  Transition of Care [facility]) CM/SW Contact:  Pollie Friar, RN Phone Number: 04/29/2020, 1:53 PM   Clinical Narrative:    Pt discharging home with [employer] services through [place]. Cory with [facility] accepted the referral.  Walker to be delivered to the room per [employer].  Pt has transportation home.   Final next level of care: Home w Home Health Services Barriers to Discharge: No Barriers Identified   Patient Goals and CMS Choice   CMS Medicare.gov Compare Post Acute Care list provided to:: Patient Choice offered to / list presented to : Patient  Discharge Placement                       Discharge Plan and Services   Discharge Planning Services: CM Consult Post Acute Care Choice: Home Health          DME Arranged: Walker rolling DME Agency: [employer] Date DME Agency Contacted: 04/29/20   Representative spoke with at DME Agency: [place]: PT [place]: [place] Date [place]: 04/29/20   Representative spoke with at [place]: [place] ([place]) Interventions     Readmission Risk Interventions No flowsheet data found.

## 2020-04-29 NOTE — TOC Benefit Eligibility Note (Signed)
Transition of Care [facility]) Benefit Eligibility Note    Patient Details  Name: Micheal Moore. MRN: [id number] Date of Birth: 1950/12/08   Medication/Dose: Manus Rudd  20 MG DAILY  Covered?: Yes  Tier: 3 Drug  Prescription Coverage Preferred Pharmacy: [facility] with Person/Company/Phone Number:: SALOMIE   @ [employer]  YB #  [phone]  Co-Pay: $45.00  Prior Approval: No  Deductible:  (NO DEDUCTIBLE WITH PLAN)  Additional Notes: 90 DAY SUPPLY FOR M/O  $ 125.00    Memory Argue Phone Number: 04/29/2020, 11:33 AM

## 2020-04-29 NOTE — Evaluation (Signed)
Physical Therapy Evaluation Patient Details Name: Micheal Moore. MRN: [id number] DOB: 10-09-1950 Today's Date: 04/29/2020   History of Present Illness  The pt is a 69 yo male presenting s/p L great toe amputation on 8/12. PMH includes: DM II, HLD, DVT not on anticoagulation, and tobacco use.    Clinical Impression  Pt in bed upon arrival of PT, agreeable to evaluation at this time. Prior to admission the pt was completely independent with driving, ADLs, and mobility without use of AD. 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 The pt now presents with limitations in functional mobility, stability, and endurance due to above dx, and will continue to benefit from skilled PT to address these deficits. The pt was able to demo good independence with bed mobility, but required minA for transfers and short ambulation due to instability without use of LLE and significant fatigue with short ambulation. The pt was unable to walk more than 15 ft at this time (with standing rest breaks every 3-4 ft), and therefore was unable to tolerate stair training at this time. The pt will continue to benefit from skilled PT to further progress functional strength, stability, and endurance to allow for safe return home.      Follow Up Recommendations Home health PT;Supervision/Assistance - 24 hour    Equipment Recommendations  Rolling walker with 5" wheels (RW vs knee scooter due to rapid fatigue)    Recommendations for Other Services       Precautions / Restrictions Precautions Precautions: Fall Required Braces or Orthoses: Other Brace Other Brace: post-op shoe Restrictions Weight Bearing Restrictions: Yes LLE Weight Bearing: Touchdown weight bearing Other Position/Activity Restrictions: TDWB in post-op shoe      Mobility  Bed Mobility Overal bed mobility: Modified Independent                Transfers Overall transfer  level: Needs assistance Equipment used: Rolling walker (2 wheeled) Transfers: Sit to/from Stand Sit to Stand: Min assist         General transfer comment: minA to power up initially with VC for hand positioning. Then minG with continued cues for safety  Ambulation/Gait Ambulation/Gait assistance: Min assist Gait Distance (Feet): 15 Feet Assistive device: Rolling walker (2 wheeled) Gait Pattern/deviations: Step-to pattern Gait velocity: slowed Gait velocity interpretation: <1.31 ft/sec, indicative of household ambulator General Gait Details: pt with very short hop-to pattern with significant reliance on BUE, stopping every 2-3 hops due to fatigue in UE  Stairs Stairs:  (pt unable today due to fatigue. has 3 steps to enter)              Balance Overall balance assessment: Mild deficits observed, not formally tested                                           Pertinent Vitals/Pain Pain Assessment: No/denies pain    Home Living Family/patient expects to be discharged to:: Private residence Living Arrangements: Non-relatives/Friends Available Help at Discharge: Friend(s);Available PRN/intermittently Type of Home: House Home Access: Stairs to enter Entrance Stairs-Rails: None Entrance Stairs-Number of Steps: 2-3 steps to enter without rails to porch, then one step into house Home Layout: One level Home Equipment: None      Prior Function Level of Independence: Independent         Comments: pt  reports independence with driving, house and yard work, 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[employer]   Dominant Hand: Right    Extremity/Trunk Assessment   Upper Extremity Assessment Upper Extremity Assessment: Overall WFL for tasks assessed    Lower Extremity Assessment Lower Extremity Assessment: Overall WFL for tasks assessed;LLE deficits/detail LLE Sensation: decreased light touch (mid calf down, pt unable  to identify touch)       Communication   Communication: No difficulties  Cognition Arousal/Alertness: Awake/alert Behavior During Therapy: WFL for tasks assessed/performed Overall Cognitive Status: Within Functional Limits for tasks assessed                                        General Comments General comments (skin integrity, edema, etc.): pt denies pain, poor sensation in LLE    Exercises     Assessment/Plan    PT Assessment Patient needs continued PT services  PT Problem List Decreased strength;Decreased mobility;Decreased activity tolerance;Decreased balance       PT Treatment Interventions DME instruction;Gait training;Therapeutic exercise;Balance training;Stair training;Functional mobility training;Therapeutic activities;Patient/family education    PT Goals (Current goals can be found in the Care Plan section)  Acute Rehab PT Goals Patient Stated Goal: return home PT Goal Formulation: With patient Time For Goal Achievement: 05/13/20 Potential to Achieve Goals: Good    Frequency Min 3X/week   Barriers to discharge   pt with 3 STE and was unable to make it to practice stairs due to fatigue       AM-PAC PT "6 Clicks" Mobility  Outcome Measure Help needed turning from your back to your side while in a flat bed without using bedrails?: None Help needed moving from lying on your back to sitting on the side of a flat bed without using bedrails?: None Help needed moving to and from a bed to a chair (including a wheelchair)?: A Little Help needed standing up from a chair using your arms (e.g., wheelchair or bedside chair)?: A Little Help needed to walk in hospital room?: A Little Help needed climbing 3-5 steps with a railing? : A Lot 6 Click Score: 19    End of Session Equipment Utilized During Treatment: Gait belt Activity Tolerance: Patient limited by fatigue;Patient tolerated treatment well Patient left: in bed;with call bell/phone within  reach Nurse Communication: Mobility status PT Visit Diagnosis: Difficulty in walking, not elsewhere classified (R26.2);Muscle weakness (generalized) (M62.81)    Time: 8466-5993 PT Time Calculation (min) (ACUTE ONLY): 23 min   Charges:   PT Evaluation $PT Eval Low Complexity: 1 Low PT Treatments $Gait Training: 8-22 mins        Karma Ganja, PT, DPT   Acute Rehabilitation Department Pager #: [phone]  Otho Bellows 04/29/2020, 10:34 AM

## 2020-05-02 LAB — CULTURE, BLOOD (ROUTINE X 2)
Culture: NO GROWTH
Culture: NO GROWTH

## 2020-05-02 LAB — GLUCOSE, CAPILLARY: Glucose-Capillary: 321 mg/dL — ABNORMAL HIGH (ref 70–99)

## 2020-05-06 ENCOUNTER — Ambulatory Visit (INDEPENDENT_AMBULATORY_CARE_PROVIDER_SITE_OTHER): Payer: Medicare HMO | Admitting: Family

## 2020-05-06 ENCOUNTER — Encounter: Payer: Self-pay | Admitting: Family

## 2020-05-06 VITALS — Ht 77.0 in | Wt 205.0 lb

## 2020-05-06 DIAGNOSIS — Z89412 Acquired absence of left great toe: Secondary | ICD-10-CM

## 2020-05-06 DIAGNOSIS — Z89512 Acquired absence of left leg below knee: Secondary | ICD-10-CM | POA: Insufficient documentation

## 2020-05-06 NOTE — Progress Notes (Signed)
   Post-Op Visit Note   Patient: Micheal Moore.           Date of Birth: 1951-01-04           MRN: [id number] Visit Date: 05/06/2020 PCP: [name], MD  Chief Complaint:  Chief Complaint  Patient presents with  . Left Foot - Routine Post Op    04/28/20 left GT amputation     HPI:  HPI The patient is a 69 year old gentleman seen today 1 week the patient has been weightbearing.Manson Passey Exam Incision approximated sutures there is 1 area of gaping this is 5 mm long gaped about 3 mm wide filled in with prior granulation tissue.  There is slight surrounding maceration no active drainage noted by me infection Visit Diagnoses: No diagnosis found.  Plan: Begin daily dose of cleansing.  Dry dressing changes.  Discussed importance of elevation and minimizing weightbearing.  Follow-Up Instructions: Return in about 10 days (around 05/16/2020).   Imaging: No results found.  Orders:  No orders of the defined types were placed in this encounter.  No orders of the defined types were placed in this encounter.    PMFS History: Patient Active Problem List   Diagnosis Date Noted  . Cellulitis in diabetic foot ([place]) 04/27/2020  . Osteomyelitis of great toe of left foot ([place]) 04/27/2020  . Type 2 diabetes mellitus with hyperlipidemia ([place]) 04/27/2020  . History of DVT (deep vein thrombosis) 04/27/2020  . Tobacco abuse 04/27/2020   Past Medical History:  Diagnosis Date  . Diabetes mellitus without complication ([place])     History reviewed. No pertinent family history.  Past Surgical History:  Procedure Laterality Date  . AMPUTATION TOE Left 04/28/2020   Procedure: AMPUTATION LEFT GREAT TOE;  Surgeon: Newt Minion, MD;  Location: [facility];  Service: Orthopedics;  Laterality: Left;   Social History   Occupational History  . Not on file  Tobacco Use  . Smoking status: Current Every Day Smoker  . Smokeless tobacco: Never Used  Substance and Sexual Activity  . Alcohol use:  No  . Drug use: Not on file  . Sexual activity: Not on file

## 2020-05-09 ENCOUNTER — Telehealth (HOSPITAL_COMMUNITY): Payer: Self-pay

## 2020-05-09 NOTE — Telephone Encounter (Signed)
PV Navigator Consult follow up. PV Navigator was on PAL at time of referral.   I was able to review patient's chart and then reach out to designated contact [name] to introduce myself and my role. Micheal Moore states patient is doing well. He is status post 11 days post up amputation of his L great toe. Patient had his ortho follow up 05/06/20 with some incision dehiscence noted at that time. Ortho encouraged patient to elevate extremity, limit weight bearing and wear post op shoe. Daily dry dressings ordered.  [name] says patient has been up walking with shoe on and noticed some dried drainage on shoe. Navigator reinforced the importance of limiting weight on affected foot to allow fragile tissue to heal. Also, to report elevated temperature, increased pain, swelling or drainage to MD ASAP.  Understanding was verbalized. Patient follows up with ortho again the last of this month. He continues to decline Nicotine patch to assist with smoking cessation. Denies questions or other barriers at this time. Encouraged to call should concerns, barriers or questions arise.   Thank you, [name] RN BSN CWS [place]

## 2020-05-26 ENCOUNTER — Encounter: Payer: Self-pay | Admitting: Physician Assistant

## 2020-05-26 ENCOUNTER — Ambulatory Visit (INDEPENDENT_AMBULATORY_CARE_PROVIDER_SITE_OTHER): Payer: Medicare HMO | Admitting: Physician Assistant

## 2020-05-26 VITALS — Ht 77.0 in | Wt 205.0 lb

## 2020-05-26 DIAGNOSIS — Z89412 Acquired absence of left great toe: Secondary | ICD-10-CM

## 2020-05-26 NOTE — Progress Notes (Signed)
Office Visit Note   Patient: Micheal Moore.           Date of Birth: 02-03-1951           MRN: [id number] Visit Date: 05/26/2020              Requested by: [name], [place],  [place] [address] PCP: [name], MD  Chief Complaint  Patient presents with  . Left Foot - Routine Post Op    04/28/20 left GT amputation       HPI: Patient presents today approximately 1 month status post left great toe amputation.  He has not changed the dressing since he saw [place] last.  He has not washed the incision as well.  Assessment & Plan: Visit Diagnoses: No diagnosis found.  Plan: Patient should be washing daily with antibacterial soap and water.  He should apply a clean dressing .  I discussed the importance of this.  We will follow up in 2 weeks  Follow-Up Instructions: No follow-ups on file.   Ortho Exam  Patient is alert, oriented, no adenopathy, well-dressed, normal affect, normal respiratory effort. Focused examination demonstrates some skin maceration but no wound dehiscence.  He has some thickened eschar.  Surgical sutures are in place no necrosis no foul odor surgical sutures were removed today  Imaging: No results found. No images are attached to the encounter.  Labs: Lab Results  Component Value Date   HGBA1C 8.4 (H) 04/27/2020   ESRSEDRATE 53 (H) 04/27/2020   CRP 6.1 (H) 04/27/2020   REPTSTATUS 05/02/2020 FINAL 04/27/2020   CULT  04/27/2020    NO GROWTH 5 DAYS Performed at [facility], [place] [address] [place], [place] [address]      Lab Results  Component Value Date   ALBUMIN 3.2 (L) 04/26/2020   ALBUMIN 4.2 03/22/2019   PREALBUMIN 11.2 (L) 04/27/2020    No results found for: MG No results found for: VD25OH  Lab Results  Component Value Date   PREALBUMIN 11.2 (L) 04/27/2020   CBC EXTENDED Latest Ref Rng & Units 04/29/2020 04/28/2020 04/26/2020  WBC 4.0 - 10.5 K/uL 8.6 6.9 9.5  RBC 4.22 - 5.81 MIL/uL 3.83(L)  3.71(L) 3.81(L)  HGB 13.0 - 17.0 g/dL 11.9(L) 11.7(L) 12.0(L)  HCT 39 - 52 % 37.2(L) 36.0(L) 37.9(L)  PLT 150 - 400 K/uL 278 273 276  NEUTROABS 1.7 - 7.7 K/uL - - 6.2  LYMPHSABS 0.7 - 4.0 K/uL - - 2.3     Body mass index is 24.31 kg/m.  Orders:  No orders of the defined types were placed in this encounter.  No orders of the defined types were placed in this encounter.    Procedures: No procedures performed  Clinical Data: No additional findings.  ROS:  All other systems negative, except as noted in the HPI. Review of Systems  Objective: Vital Signs: Ht 6\' 5"  (1.956 m)   Wt 205 lb (93 kg)   BMI 24.31 kg/m   Specialty Comments:  No specialty comments available.  PMFS History: Patient Active Problem List   Diagnosis Date Noted  . Left great toe amputee ([place]) 05/06/2020  . Cellulitis in diabetic foot ([place]) 04/27/2020  . Osteomyelitis of great toe of left foot ([place]) 04/27/2020  . Type 2 diabetes mellitus with hyperlipidemia ([place]) 04/27/2020  . History of DVT (deep vein thrombosis) 04/27/2020  . Tobacco abuse 04/27/2020   Past Medical History:  Diagnosis Date  . Diabetes mellitus  without complication ([place])     No family history on file.  Past Surgical History:  Procedure Laterality Date  . AMPUTATION TOE Left 04/28/2020   Procedure: AMPUTATION LEFT GREAT TOE;  Surgeon: Newt Minion, MD;  Location: [facility];  Service: Orthopedics;  Laterality: Left;   Social History   Occupational History  . Not on file  Tobacco Use  . Smoking status: Current Every Day Smoker  . Smokeless tobacco: Never Used  Substance and Sexual Activity  . Alcohol use: No  . Drug use: Not on file  . Sexual activity: Not on file

## 2020-06-09 ENCOUNTER — Encounter: Payer: Self-pay | Admitting: Physician Assistant

## 2020-06-09 ENCOUNTER — Ambulatory Visit (INDEPENDENT_AMBULATORY_CARE_PROVIDER_SITE_OTHER): Payer: Medicare HMO | Admitting: Physician Assistant

## 2020-06-09 DIAGNOSIS — M869 Osteomyelitis, unspecified: Secondary | ICD-10-CM

## 2020-06-09 NOTE — Progress Notes (Signed)
Office Visit Note   Patient: Micheal Moore.           Date of Birth: 1950-10-01           MRN: [id number] Visit Date: 06/09/2020              Requested by: [name], MD [place],  [place] [id number] PCP: [name], MD  No chief complaint on file.     HPI: This is a pleasant gentleman who is 6 weeks status post left great toe amputation he is overall doing well.  No complaints.  Assessment & Plan: Visit Diagnoses: No diagnosis found.  Plan: We talked about getting a filler for shoe he did not want to do this.  He should look for a toe with an adequate toe box.  Follow-up for final visit in 1 month.  Follow-Up Instructions: No follow-ups on file.   Ortho Exam  Patient is alert, oriented, no adenopathy, well-dressed, normal affect, normal respiratory effort. Incision is overall healed quite well mild amount of soft tissue swelling no cellulitis small eschar over the top of the incision no necrosis  Imaging: No results found. No images are attached to the encounter.  Labs: Lab Results  Component Value Date   HGBA1C 8.4 (H) 04/27/2020   ESRSEDRATE 53 (H) 04/27/2020   CRP 6.1 (H) 04/27/2020   REPTSTATUS 05/02/2020 FINAL 04/27/2020   CULT  04/27/2020    NO GROWTH 5 DAYS Performed at [facility], [place] [address] [place], [place] [address]      Lab Results  Component Value Date   ALBUMIN 3.2 (L) 04/26/2020   ALBUMIN 4.2 03/22/2019   PREALBUMIN 11.2 (L) 04/27/2020    No results found for: MG No results found for: VD25OH  Lab Results  Component Value Date   PREALBUMIN 11.2 (L) 04/27/2020   CBC EXTENDED Latest Ref Rng & Units 04/29/2020 04/28/2020 04/26/2020  WBC 4.0 - 10.5 K/uL 8.6 6.9 9.5  RBC 4.22 - 5.81 MIL/uL 3.83(L) 3.71(L) 3.81(L)  HGB 13.0 - 17.0 g/dL 11.9(L) 11.7(L) 12.0(L)  HCT 39 - 52 % 37.2(L) 36.0(L) 37.9(L)  PLT 150 - 400 K/uL 278 273 276  NEUTROABS 1.7 - 7.7 K/uL - - 6.2  LYMPHSABS 0.7 - 4.0 K/uL - - 2.3       There is no height or weight on file to calculate BMI.  Orders:  No orders of the defined types were placed in this encounter.  No orders of the defined types were placed in this encounter.    Procedures: No procedures performed  Clinical Data: No additional findings.  ROS:  All other systems negative, except as noted in the HPI. Review of Systems  Objective: Vital Signs: There were no vitals taken for this visit.  Specialty Comments:  No specialty comments available.  PMFS History: Patient Active Problem List   Diagnosis Date Noted  . Left great toe amputee ([place]) 05/06/2020  . Cellulitis in diabetic foot ([place]) 04/27/2020  . Osteomyelitis of great toe of left foot ([place]) 04/27/2020  . Type 2 diabetes mellitus with hyperlipidemia ([place]) 04/27/2020  . History of DVT (deep vein thrombosis) 04/27/2020  . Tobacco abuse 04/27/2020   Past Medical History:  Diagnosis Date  . Diabetes mellitus without complication ([place])     No family history on file.  Past Surgical History:  Procedure Laterality Date  . AMPUTATION TOE Left 04/28/2020   Procedure: AMPUTATION LEFT GREAT TOE;  Surgeon: Newt Minion,  MD;  Location: [facility];  Service: Orthopedics;  Laterality: Left;   Social History   Occupational History  . Not on file  Tobacco Use  . Smoking status: Current Every Day Smoker  . Smokeless tobacco: Never Used  Substance and Sexual Activity  . Alcohol use: No  . Drug use: Not on file  . Sexual activity: Not on file

## 2020-06-17 DIAGNOSIS — G4733 Obstructive sleep apnea (adult) (pediatric): Secondary | ICD-10-CM | POA: Diagnosis not present

## 2020-06-17 DIAGNOSIS — E1140 Type 2 diabetes mellitus with diabetic neuropathy, unspecified: Secondary | ICD-10-CM | POA: Diagnosis not present

## 2020-06-17 DIAGNOSIS — I87009 Postthrombotic syndrome without complications of unspecified extremity: Secondary | ICD-10-CM | POA: Diagnosis not present

## 2020-06-17 DIAGNOSIS — E785 Hyperlipidemia, unspecified: Secondary | ICD-10-CM | POA: Diagnosis not present

## 2020-06-17 DIAGNOSIS — S98112A Complete traumatic amputation of left great toe, initial encounter: Secondary | ICD-10-CM | POA: Diagnosis not present

## 2020-06-17 DIAGNOSIS — I82401 Acute embolism and thrombosis of unspecified deep veins of right lower extremity: Secondary | ICD-10-CM | POA: Diagnosis not present

## 2020-06-17 DIAGNOSIS — C44612 Basal cell carcinoma of skin of right upper limb, including shoulder: Secondary | ICD-10-CM | POA: Diagnosis not present

## 2020-06-27 DIAGNOSIS — C4361 Malignant melanoma of right upper limb, including shoulder: Secondary | ICD-10-CM | POA: Diagnosis not present

## 2020-06-27 DIAGNOSIS — Z85828 Personal history of other malignant neoplasm of skin: Secondary | ICD-10-CM | POA: Diagnosis not present

## 2020-06-27 DIAGNOSIS — C4362 Malignant melanoma of left upper limb, including shoulder: Secondary | ICD-10-CM | POA: Diagnosis not present

## 2020-06-27 DIAGNOSIS — D485 Neoplasm of uncertain behavior of skin: Secondary | ICD-10-CM | POA: Diagnosis not present

## 2020-07-07 ENCOUNTER — Ambulatory Visit (INDEPENDENT_AMBULATORY_CARE_PROVIDER_SITE_OTHER): Payer: Medicare HMO | Admitting: Physician Assistant

## 2020-07-07 ENCOUNTER — Encounter: Payer: Self-pay | Admitting: Physician Assistant

## 2020-07-07 VITALS — Ht 77.0 in | Wt 205.0 lb

## 2020-07-07 DIAGNOSIS — Z85828 Personal history of other malignant neoplasm of skin: Secondary | ICD-10-CM | POA: Diagnosis not present

## 2020-07-07 DIAGNOSIS — M869 Osteomyelitis, unspecified: Secondary | ICD-10-CM

## 2020-07-07 DIAGNOSIS — D485 Neoplasm of uncertain behavior of skin: Secondary | ICD-10-CM | POA: Diagnosis not present

## 2020-07-07 DIAGNOSIS — L859 Epidermal thickening, unspecified: Secondary | ICD-10-CM | POA: Diagnosis not present

## 2020-07-07 DIAGNOSIS — L570 Actinic keratosis: Secondary | ICD-10-CM | POA: Diagnosis not present

## 2020-07-07 DIAGNOSIS — C44519 Basal cell carcinoma of skin of other part of trunk: Secondary | ICD-10-CM | POA: Diagnosis not present

## 2020-07-07 DIAGNOSIS — L821 Other seborrheic keratosis: Secondary | ICD-10-CM | POA: Diagnosis not present

## 2020-07-07 DIAGNOSIS — C4441 Basal cell carcinoma of skin of scalp and neck: Secondary | ICD-10-CM | POA: Diagnosis not present

## 2020-07-07 NOTE — Progress Notes (Signed)
Office Visit Note   Patient: Micheal Moore.           Date of Birth: 28-Aug-1951           MRN: [id number] Visit Date: 07/07/2020              Requested by: [name], [place],  [place] [address] PCP: [name], MD  Chief Complaint  Patient presents with  . Left Foot - Routine Post Op    04/28/20 left GT amputation       HPI: Patient is 9 weeks status post left great toe amputation.  He has been weightbearing with his postop shoe.  He wants to know when he can transition to a regular shoe his only concerns are with regards to swelling  Assessment & Plan: Visit Diagnoses: No diagnosis found.  Plan: I have discussed in the past the patient obtaining a carbon fiber plate is not interested in this at this time.  Talked again about shoes that have high toe box as would be helpful.  I also think he would do very well with the compression socks on both his legs as he has some chronic venous stasis.  He is not interested in this.  He will follow-up as needed  Follow-Up Instructions: No follow-ups on file.   Ortho Exam  Patient is alert, oriented, no adenopathy, well-dressed, normal affect, normal respiratory effort. Left great toe well-healed surgical incision mild soft tissue swelling.  There is minimal amount of thin callusing.  No surrounding cellulitis or drainage.  Overall swelling is fairly well controlled  Imaging: No results found. No images are attached to the encounter.  Labs: Lab Results  Component Value Date   HGBA1C 8.4 (H) 04/27/2020   ESRSEDRATE 53 (H) 04/27/2020   CRP 6.1 (H) 04/27/2020   REPTSTATUS 05/02/2020 FINAL 04/27/2020   CULT  04/27/2020    NO GROWTH 5 DAYS Performed at [facility], [place] [address], [place], [place] [address]      Lab Results  Component Value Date   ALBUMIN 3.2 (L) 04/26/2020   ALBUMIN 4.2 03/22/2019   PREALBUMIN 11.2 (L) 04/27/2020    No results found for: MG No results found for:  VD25OH  Lab Results  Component Value Date   PREALBUMIN 11.2 (L) 04/27/2020   CBC EXTENDED Latest Ref Rng & Units 04/29/2020 04/28/2020 04/26/2020  WBC 4.0 - 10.5 K/uL 8.6 6.9 9.5  RBC 4.22 - 5.81 MIL/uL 3.83(L) 3.71(L) 3.81(L)  HGB 13.0 - 17.0 g/dL 11.9(L) 11.7(L) 12.0(L)  HCT 39 - 52 % 37.2(L) 36.0(L) 37.9(L)  PLT 150 - 400 K/uL 278 273 276  NEUTROABS 1.7 - 7.7 K/uL - - 6.2  LYMPHSABS 0.7 - 4.0 K/uL - - 2.3     Body mass index is 24.31 kg/m.  Orders:  No orders of the defined types were placed in this encounter.  No orders of the defined types were placed in this encounter.    Procedures: No procedures performed  Clinical Data: No additional findings.  ROS:  All other systems negative, except as noted in the HPI. Review of Systems  Objective: Vital Signs: Ht 6\' 5"  (1.956 m)   Wt 205 lb (93 kg)   BMI 24.31 kg/m   Specialty Comments:  No specialty comments available.  PMFS History: Patient Active Problem List   Diagnosis Date Noted  . Left great toe amputee ([place]) 05/06/2020  . Cellulitis in diabetic foot ([place]) 04/27/2020  .  Osteomyelitis of great toe of left foot ([place]) 04/27/2020  . Type 2 diabetes mellitus with hyperlipidemia ([place]) 04/27/2020  . History of DVT (deep vein thrombosis) 04/27/2020  . Tobacco abuse 04/27/2020   Past Medical History:  Diagnosis Date  . Diabetes mellitus without complication ([place])     No family history on file.  Past Surgical History:  Procedure Laterality Date  . AMPUTATION TOE Left 04/28/2020   Procedure: AMPUTATION LEFT GREAT TOE;  Surgeon: Newt Minion, MD;  Location: [facility];  Service: Orthopedics;  Laterality: Left;   Social History   Occupational History  . Not on file  Tobacco Use  . Smoking status: Current Every Day Smoker  . Smokeless tobacco: Never Used  Substance and Sexual Activity  . Alcohol use: No  . Drug use: Not on file  . Sexual activity: Not on file

## 2020-07-08 ENCOUNTER — Telehealth: Payer: Self-pay | Admitting: Oncology

## 2020-07-08 NOTE — Telephone Encounter (Signed)
Received a new pt referral from Dr. Ronnald Ramp at [facility] for metastatic melanoma. Mr. Micheal Moore has been scheduled to see Dr. Alen Blew on 10/27 at 11am. I cld and lft the appt date and time on the pt's vm.

## 2020-07-13 ENCOUNTER — Ambulatory Visit: Payer: Medicare HMO | Admitting: Oncology

## 2020-07-21 ENCOUNTER — Inpatient Hospital Stay: Payer: Medicare HMO | Attending: Oncology | Admitting: Oncology

## 2020-07-21 ENCOUNTER — Other Ambulatory Visit: Payer: Self-pay

## 2020-07-21 VITALS — BP 132/85 | HR 105 | Temp 97.2°F | Resp 18 | Ht 77.0 in | Wt 216.0 lb

## 2020-07-21 DIAGNOSIS — F17200 Nicotine dependence, unspecified, uncomplicated: Secondary | ICD-10-CM

## 2020-07-21 DIAGNOSIS — C439 Malignant melanoma of skin, unspecified: Secondary | ICD-10-CM

## 2020-07-21 DIAGNOSIS — Z79899 Other long term (current) drug therapy: Secondary | ICD-10-CM | POA: Diagnosis not present

## 2020-07-21 DIAGNOSIS — C4361 Malignant melanoma of right upper limb, including shoulder: Secondary | ICD-10-CM | POA: Diagnosis not present

## 2020-07-21 DIAGNOSIS — Z7984 Long term (current) use of oral hypoglycemic drugs: Secondary | ICD-10-CM | POA: Insufficient documentation

## 2020-07-21 DIAGNOSIS — E119 Type 2 diabetes mellitus without complications: Secondary | ICD-10-CM | POA: Diagnosis not present

## 2020-07-21 DIAGNOSIS — E1169 Type 2 diabetes mellitus with other specified complication: Secondary | ICD-10-CM | POA: Insufficient documentation

## 2020-07-21 NOTE — Progress Notes (Signed)
Reason for the request:   Melanoma  HPI: I was asked by Dr. Ronnald Ramp  to evaluate Mr. Micheal Moore for diagnosis of melanoma. 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 He was evaluated by Dr. Danella Sensing from [facility] dermatology for a lesion on his elbow in October 2021.  The patient reports that the lesion has been there for an extended period of time and does not recall how long has it been.  He was referred by Dr. Forde Dandy to Dr. Ronnald Ramp for an evaluation.  He had an examination and found to have a lesion of the right superior lateral elbow that was biopsied on 06/27/2020. 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 It was noted that there is a lack of intraepidermal component which is concerning for metastatic melanoma (Case # [id number]). Based on these findings he was referred to me for evaluation. He did have other skin lesions that were biopsied without any evidence of melanoma.  He did have a lesion on his neck that was consistent with basal cell.   He does not report any headaches, blurry vision, syncope or seizures. Does not report any fevers, chills or sweats.  Does not report any cough, wheezing or hemoptysis.  Does not report any chest pain, palpitation, orthopnea or leg edema.  Does not report any nausea, vomiting or abdominal pain.  Does not report any constipation or diarrhea.  Does not report any skeletal complaints.    Does not report frequency, urgency or hematuria.  Does not report any skin rashes or lesions. Does not report any heat or cold intolerance.  Does not report any lymphadenopathy or petechiae.  Does not report any anxiety or depression.  Remaining review of systems is negative.    Past Medical History:  Diagnosis Date  . Diabetes mellitus without complication ([facility])   :  Past Surgical History:  Procedure Laterality Date  . AMPUTATION TOE Left 04/28/2020   Procedure:  AMPUTATION LEFT GREAT TOE;  Surgeon: Newt Minion, MD;  Location: [facility];  Service: Orthopedics;  Laterality: Left;  :   Current Outpatient Medications:  .  aspirin 325 MG tablet, Take 325 mg by mouth at bedtime., Disp: , Rfl:  .  glimepiride (AMARYL) 2 MG tablet, Take 2 mg by mouth at bedtime. , Disp: , Rfl:  .  metFORMIN (GLUCOPHAGE) 500 MG tablet, Take 2 tablets (1,000 mg total) by mouth 2 (two) times daily., Disp: 120 tablet, Rfl: 0 .  rosuvastatin (CRESTOR) 5 MG tablet, Take 5 mg by mouth 2 (two) times a week. On Tuesday & Saturday, Disp: , Rfl: :  No Known Allergies:  No family history on file.:  Social History   Socioeconomic History  . Marital status: Divorced    Spouse name: Not on file  . Number of children: Not on file  . Years of education: Not on file  . Highest education level: Not on file  Occupational History  . Not on file  Tobacco Use  . Smoking status: Current Every Day Smoker  . Smokeless tobacco: Never Used  Substance and Sexual Activity  . Alcohol use: No  . Drug use: Not on file  . Sexual activity: Not on file  Other Topics Concern  . Not on file  Social History Narrative  . Not on file   Social Determinants of Health   Financial Resource Strain:   . Difficulty of Paying  Living Expenses: Not on file  Food Insecurity:   . Worried About Charity fundraiser in the Last Year: Not on file  . Ran Out of Food in the Last Year: Not on file  Transportation Needs:   . Lack of Transportation (Medical): Not on file  . Lack of Transportation (Non-Medical): Not on file  Physical Activity:   . Days of Exercise per Week: Not on file  . Minutes of Exercise per Session: Not on file  Stress:   . Feeling of Stress : Not on file  Social Connections:   . Frequency of Communication with Friends and Family: Not on file  . Frequency of Social Gatherings with Friends and Family: Not on file  . Attends Religious Services: Not on file  . Active Member of Clubs or  Organizations: Not on file  . Attends Archivist Meetings: Not on file  . Marital Status: Not on file  Intimate Partner Violence:   . Fear of Current or Ex-Partner: Not on file  . Emotionally Abused: Not on file  . Physically Abused: Not on file  . Sexually Abused: Not on file  :  Pertinent items are noted in HPI.  Exam:  General appearance: alert and cooperative appeared without distress. Head: atraumatic without any abnormalities. Eyes: conjunctivae/corneas clear. PERRL.  Sclera anicteric. Throat: lips, mucosa, and tongue normal; without oral thrush or ulcers. Resp: clear to auscultation bilaterally without rhonchi, wheezes or dullness to percussion. Cardio: regular rate and rhythm, S1, S2 normal, no murmur, click, rub or gallop GI: soft, non-tender; bowel sounds normal; no masses,  no organomegaly Skin: A protruding nodule noted on his right elbow.  No hyperpigmentation, erythema or induration noted. Lymph nodes: Cervical, supraclavicular, and axillary nodes normal. Neurologic: Grossly normal without any motor, sensory or deep tendon reflexes. Musculoskeletal: No joint deformity or effusion.    Assessment and Plan:   69 year old man with:  1.  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  This is biopsy-proven to be melanoma with a suspicious pathological findings that could suggest metastatic disease.  He has no previous history of melanoma or other skin lesions to suggest a primary.  The natural course of this disease was discussed and management options were reviewed.  The first step is to complete his staging work-up including a PET scan to determine whether he has indeed metastatic disease with internal metastasis including lymph node or visceral metastasis.  I will also refer him to Dr. Barry Dienes at [facility] surgery for surgical evaluation.  If he has an isolated area of involvement, local wide excision and sentinel lymph node sampling will  be warranted at that time.  The role of systemic therapy was discussed at this time and will be determined pending the extent of his disease.  If he has stage III or higher, systemic therapy would be certainly indicated in the setting of immunotherapy in the form of adjuvant or palliative setting.  If he has localized disease then systemic therapy might not have a role.  The plan is to obtain that CT scan in the near future and make the appropriate referral to Dr. Barry Dienes.  2.  Diabetes: Under reasonable control at this time.  This is managed by Dr. Forde Dandy with oral hypoglycemic agents.  3.  Follow-up: Will be in the near future after completing his work-up and evaluation by Dr. Barry Dienes.  60  minutes were dedicated to this visit. The time was spent on reviewing discussing treatment  options, discussing differential diagnosis and answering questions regarding future plan.    A copy of this consult has been forwarded to the requesting physician.

## 2020-07-22 ENCOUNTER — Telehealth: Payer: Self-pay | Admitting: Oncology

## 2020-07-22 NOTE — Telephone Encounter (Signed)
Scheduled appointment per 11/4 los. Called patient, no answer. Left message for patient with appointment date and time. Sent referral through [facility].

## 2020-08-02 DIAGNOSIS — C439 Malignant melanoma of skin, unspecified: Secondary | ICD-10-CM | POA: Diagnosis not present

## 2020-08-04 ENCOUNTER — Other Ambulatory Visit: Payer: Self-pay

## 2020-08-04 ENCOUNTER — Ambulatory Visit (HOSPITAL_COMMUNITY)
Admission: RE | Admit: 2020-08-04 | Discharge: 2020-08-04 | Disposition: A | Discharge status: Home / Self Care | Payer: Medicare HMO | Source: Ambulatory Visit | Attending: Oncology | Admitting: Oncology

## 2020-08-04 DIAGNOSIS — C4361 Malignant melanoma of right upper limb, including shoulder: Secondary | ICD-10-CM | POA: Diagnosis not present

## 2020-08-04 DIAGNOSIS — C439 Malignant melanoma of skin, unspecified: Secondary | ICD-10-CM | POA: Diagnosis not present

## 2020-08-04 DIAGNOSIS — M19021 Primary osteoarthritis, right elbow: Secondary | ICD-10-CM | POA: Diagnosis not present

## 2020-08-04 DIAGNOSIS — I2510 Atherosclerotic heart disease of native coronary artery without angina pectoris: Secondary | ICD-10-CM | POA: Diagnosis not present

## 2020-08-04 DIAGNOSIS — K449 Diaphragmatic hernia without obstruction or gangrene: Secondary | ICD-10-CM | POA: Diagnosis not present

## 2020-08-04 LAB — GLUCOSE, CAPILLARY: Glucose-Capillary: 157 mg/dL — ABNORMAL HIGH (ref 70–99)

## 2020-08-04 MED ORDER — FLUDEOXYGLUCOSE F - 18 (FDG) INJECTION
10.8300 | Freq: Once | INTRAVENOUS | Status: AC | PRN
  Administered 2020-08-04: 10.83 via INTRAVENOUS

## 2020-08-10 ENCOUNTER — Ambulatory Visit: Payer: Self-pay | Admitting: Surgery

## 2020-08-10 DIAGNOSIS — C439 Malignant melanoma of skin, unspecified: Secondary | ICD-10-CM

## 2020-08-16 ENCOUNTER — Other Ambulatory Visit: Payer: Self-pay

## 2020-08-16 ENCOUNTER — Other Ambulatory Visit (HOSPITAL_COMMUNITY)
Admission: RE | Admit: 2020-08-16 | Discharge: 2020-08-16 | Disposition: A | Discharge status: Home / Self Care | Payer: Medicare HMO | Source: Ambulatory Visit | Attending: Surgery | Admitting: Surgery

## 2020-08-16 ENCOUNTER — Inpatient Hospital Stay (HOSPITAL_BASED_OUTPATIENT_CLINIC_OR_DEPARTMENT_OTHER): Payer: Medicare HMO | Admitting: Oncology

## 2020-08-16 VITALS — BP 150/82 | HR 102 | Temp 97.7°F | Resp 18 | Ht 77.0 in | Wt 211.8 lb

## 2020-08-16 DIAGNOSIS — Z20822 Contact with and (suspected) exposure to covid-19: Secondary | ICD-10-CM | POA: Diagnosis not present

## 2020-08-16 DIAGNOSIS — Z01812 Encounter for preprocedural laboratory examination: Secondary | ICD-10-CM | POA: Diagnosis not present

## 2020-08-16 DIAGNOSIS — F17200 Nicotine dependence, unspecified, uncomplicated: Secondary | ICD-10-CM | POA: Diagnosis not present

## 2020-08-16 DIAGNOSIS — C439 Malignant melanoma of skin, unspecified: Secondary | ICD-10-CM | POA: Diagnosis not present

## 2020-08-16 DIAGNOSIS — E1169 Type 2 diabetes mellitus with other specified complication: Secondary | ICD-10-CM | POA: Diagnosis not present

## 2020-08-16 DIAGNOSIS — Z7984 Long term (current) use of oral hypoglycemic drugs: Secondary | ICD-10-CM | POA: Diagnosis not present

## 2020-08-16 DIAGNOSIS — Z79899 Other long term (current) drug therapy: Secondary | ICD-10-CM | POA: Diagnosis not present

## 2020-08-16 DIAGNOSIS — C4361 Malignant melanoma of right upper limb, including shoulder: Secondary | ICD-10-CM | POA: Diagnosis not present

## 2020-08-16 LAB — SARS CORONAVIRUS 2 (TAT 6-24 HRS): SARS Coronavirus 2: NEGATIVE

## 2020-08-16 NOTE — Progress Notes (Signed)
Hematology and Oncology Follow Up Visit  Micheal Moore [id number] October 18, 1950 69 y.o. 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  PET CT scan did not show any evidence of metastatic disease with the area appears to be primary.   Prior Therapy: He status post shave biopsy completed by Dr. Ronnald Ramp in October 2021.  Current therapy: He is under evaluation for surgical resection by Dr. Zenia Resides.  Interim History: Micheal Moore presents today for a follow-up visit.  Since the last visit, he reports no major changes in his health.  He denies any recent hospitalization or illnesses.  He denies any worsening pain or increase in the growth of his right elbow nodule.     Medications: I have reviewed the patient's current medications.  Current Outpatient Medications  Medication Sig Dispense Refill  . aspirin 325 MG tablet Take 325 mg by mouth at bedtime.    Marland Kitchen glimepiride (AMARYL) 2 MG tablet Take 2 mg by mouth at bedtime.     . metFORMIN (GLUCOPHAGE) 1000 MG tablet     . metFORMIN (GLUCOPHAGE) 500 MG tablet Take 2 tablets (1,000 mg total) by mouth 2 (two) times daily. 120 tablet 0  . rosuvastatin (CRESTOR) 5 MG tablet Take 5 mg by mouth 2 (two) times a week. On Tuesday & Saturday     No current facility-administered medications for this visit.     Allergies: No Known Allergies    Physical Exam: Blood pressure (!) 150/82, pulse (!) 102, temperature 97.7 F (36.5 C), temperature source Tympanic, resp. rate 18, height 6\' 5"  (1.956 m), weight 211 lb 12.8 oz (96.1 kg), SpO2 99 %. ECOG: 0   General appearance: Comfortable appearing without any discomfort Head: Normocephalic without any trauma Oropharynx: Mucous membranes are moist and pink without any thrush or ulcers. Eyes: Pupils are equal and round reactive to light. Lymph nodes: No cervical, supraclavicular,  inguinal or axillary lymphadenopathy.   Heart:regular rate and rhythm.  S1 and S2 without leg edema. Lung: Clear without any rhonchi or wheezes.  No dullness to percussion. Abdomin: Soft, nontender, nondistended with good bowel sounds.  No hepatosplenomegaly. Musculoskeletal: No joint deformity or effusion.  Full range of motion noted. Neurological: No deficits noted on motor, sensory and deep tendon reflex exam. Skin: No petechial rash or dryness.  Appeared moist.      Lab Results: Lab Results  Component Value Date   WBC 8.6 04/29/2020   HGB 11.9 (L) 04/29/2020   HCT 37.2 (L) 04/29/2020   MCV 97.1 04/29/2020   PLT 278 04/29/2020     Chemistry      Component Value Date/Time   NA 134 (L) 04/29/2020 0620   K 5.2 (H) 04/29/2020 0620   CL 98 04/29/2020 0620   CO2 26 04/29/2020 0620   BUN 20 04/29/2020 0620   CREATININE 1.07 04/29/2020 0620      Component Value Date/Time   CALCIUM 9.1 04/29/2020 0620   ALKPHOS 56 04/26/2020 0848   AST 20 04/26/2020 0848   ALT 20 04/26/2020 0848   BILITOT 0.5 04/26/2020 0848     IMPRESSION: Hypermetabolic nodular cutaneous focus along the posterolateral aspect of the right elbow which likely corresponds known cutaneous melanoma. No evidence of nodal spread or definite distant metastases.  Prior left great toe amputation with mild metabolic activity throughout the left foot primarily centered about the midfoot, which is likely inflammatory. Correlation with  direct visualization is recommended.  Low level metabolic activity associated with subcutaneous anterior pelvic stranding with a more nodular focus of overlying hypermetabolic cutaneous activity without CT correlate, which is nonspecific but favored to be inflammatory in etiology. Correlation with direct visualization is recommended.  Impression and Plan:   69 year old man with:  1.    Primary of melanoma of the right arm.  He presented with a nodule on his right elbow.   This is biopsy-proven to be melanoma with a suspicious pathological findings that could suggest metastatic disease.   PET CT scan obtained on 08/04/2020 was personally reviewed.  His case was discussed at the melanoma tumor board including discussion with pathology as well as review of his pet imaging with the reviewing radiologist.  He has no evidence of metastatic disease on pet imaging and that the tumor appears to be a likely primary arising from the right arm.  Treatment options were discussed at this time primary surgical therapy is the standard of care including local wide excision and sentinel lymph node sampling.  He is scheduled to have that done in the near future under the care of Dr. Zenia Resides.   The role for systemic therapy at this time in the form of immunotherapy were reviewed.  This will be in the case of a positive lymph node as an adjuvant therapy.   2.  Follow-up: After completing his surgical resection and recovery to discuss whether he would require additional therapy.   30  minutes were spent on this encounter.  Time was dedicated to reviewing his disease status, discussing treatment options, reviewing imaging studies and discussion for his case with other consultants.   Micheal Button, MD 11/30/20213:30 PM

## 2020-08-18 ENCOUNTER — Encounter (HOSPITAL_COMMUNITY): Payer: Self-pay | Admitting: Surgery

## 2020-08-18 ENCOUNTER — Other Ambulatory Visit: Payer: Self-pay

## 2020-08-18 NOTE — Progress Notes (Signed)
Micheal Moore denies chest pain or shortness of breath. Patient tested negative for Covid_on 11/30/21_ and has been in quarantine since that time. Micheal Moore has type II diabetes- patient does not check CBGs- "they were good so I just threw the thing away."  Dr. Forde Dandy is patient 's Dr. requested records from Dr. Baldwin Crown office. I instructed Micheal Moore to not take Glimepiride tonight. Patient takes all medications in the evening.

## 2020-08-19 ENCOUNTER — Encounter (HOSPITAL_COMMUNITY): Payer: Self-pay | Admitting: Surgery

## 2020-08-19 ENCOUNTER — Ambulatory Visit (HOSPITAL_COMMUNITY)
Admission: RE | Admit: 2020-08-19 | Discharge: 2020-08-19 | Disposition: A | Discharge status: Home / Self Care | Payer: Medicare HMO | Source: Ambulatory Visit | Attending: Surgery | Admitting: Surgery

## 2020-08-19 ENCOUNTER — Ambulatory Visit (HOSPITAL_COMMUNITY): Payer: Medicare HMO | Admitting: Anesthesiology

## 2020-08-19 ENCOUNTER — Ambulatory Visit (HOSPITAL_COMMUNITY)
Admission: RE | Admit: 2020-08-19 | Discharge: 2020-08-19 | Disposition: A | Discharge status: Home / Self Care | Payer: Medicare HMO | Attending: Surgery | Admitting: Surgery

## 2020-08-19 ENCOUNTER — Encounter (HOSPITAL_COMMUNITY)
Admission: RE | Disposition: A | Discharge status: Home / Self Care | Payer: Self-pay | Source: Home / Self Care | Attending: Surgery

## 2020-08-19 DIAGNOSIS — E119 Type 2 diabetes mellitus without complications: Secondary | ICD-10-CM | POA: Diagnosis not present

## 2020-08-19 DIAGNOSIS — Z7984 Long term (current) use of oral hypoglycemic drugs: Secondary | ICD-10-CM | POA: Diagnosis not present

## 2020-08-19 DIAGNOSIS — Z79899 Other long term (current) drug therapy: Secondary | ICD-10-CM | POA: Insufficient documentation

## 2020-08-19 DIAGNOSIS — C439 Malignant melanoma of skin, unspecified: Secondary | ICD-10-CM

## 2020-08-19 DIAGNOSIS — Z7982 Long term (current) use of aspirin: Secondary | ICD-10-CM | POA: Insufficient documentation

## 2020-08-19 DIAGNOSIS — R590 Localized enlarged lymph nodes: Secondary | ICD-10-CM | POA: Diagnosis not present

## 2020-08-19 DIAGNOSIS — E1169 Type 2 diabetes mellitus with other specified complication: Secondary | ICD-10-CM

## 2020-08-19 DIAGNOSIS — F17210 Nicotine dependence, cigarettes, uncomplicated: Secondary | ICD-10-CM | POA: Insufficient documentation

## 2020-08-19 DIAGNOSIS — C4361 Malignant melanoma of right upper limb, including shoulder: Secondary | ICD-10-CM | POA: Diagnosis not present

## 2020-08-19 DIAGNOSIS — E785 Hyperlipidemia, unspecified: Secondary | ICD-10-CM | POA: Diagnosis not present

## 2020-08-19 HISTORY — DX: Malignant (primary) neoplasm, unspecified: C80.1

## 2020-08-19 HISTORY — PX: MELANOMA EXCISION WITH SENTINEL LYMPH NODE BIOPSY: SHX5267

## 2020-08-19 HISTORY — DX: Acute embolism and thrombosis of unspecified deep veins of unspecified lower extremity: I82.409

## 2020-08-19 LAB — GLUCOSE, CAPILLARY
Glucose-Capillary: 218 mg/dL — ABNORMAL HIGH (ref 70–99)
Glucose-Capillary: 263 mg/dL — ABNORMAL HIGH (ref 70–99)
Glucose-Capillary: 215 mg/dL — ABNORMAL HIGH (ref 70–99)

## 2020-08-19 LAB — BASIC METABOLIC PANEL
Anion gap: 12 (ref 5–15)
BUN: 23 mg/dL (ref 8–23)
CO2: 21 mmol/L — ABNORMAL LOW (ref 22–32)
Calcium: 9.2 mg/dL (ref 8.9–10.3)
Chloride: 103 mmol/L (ref 98–111)
Creatinine, Ser: 1.01 mg/dL (ref 0.61–1.24)
GFR, Estimated: >60 mL/min (ref >60)
Glucose, Bld: 296 mg/dL — ABNORMAL HIGH (ref 70–99)
Potassium: 4.5 mmol/L (ref 3.5–5.1)
Sodium: 136 mmol/L (ref 135–145)

## 2020-08-19 LAB — CBC
HCT: 39.9 % (ref 39.0–52.0)
Hemoglobin: 12.7 g/dL — ABNORMAL LOW (ref 13.0–17.0)
MCH: 31.5 pg (ref 26.0–34.0)
MCHC: 31.8 g/dL (ref 30.0–36.0)
MCV: 99 fL (ref 80.0–100.0)
Platelets: 186 10*3/uL (ref 150–400)
RBC: 4.03 MIL/uL — ABNORMAL LOW (ref 4.22–5.81)
RDW: 13.1 % (ref 11.5–15.5)
WBC: 7.1 10*3/uL (ref 4.0–10.5)
nRBC: 0 % (ref 0.0–0.2)

## 2020-08-19 SURGERY — MELANOMA EXCISION WITH SENTINEL LYMPH NODE BIOPSY
Anesthesia: General | Site: Elbow | Laterality: Right

## 2020-08-19 MED ORDER — SODIUM CHLORIDE (PF) 0.9 % IJ SOLN
INTRAMUSCULAR | Status: AC
  Filled 2020-08-19: qty 10

## 2020-08-19 MED ORDER — OXYCODONE HCL 5 MG PO TABS: 5.0000 mg | ORAL_TABLET | Freq: Four times a day (QID) | ORAL | 0 refills | Status: DC | PRN

## 2020-08-19 MED ORDER — OXYCODONE HCL 5 MG PO TABS: 5.0000 mg | ORAL_TABLET | Freq: Once | ORAL | Status: DC | PRN

## 2020-08-19 MED ORDER — LACTATED RINGERS IV SOLN: INTRAVENOUS | Status: DC

## 2020-08-19 MED ORDER — FENTANYL CITRATE (PF) 100 MCG/2ML IJ SOLN
INTRAMUSCULAR | Status: DC | PRN
  Administered 2020-08-19 (×2): 50 ug via INTRAVENOUS

## 2020-08-19 MED ORDER — OXYCODONE HCL 5 MG/5ML PO SOLN: 5.0000 mg | Freq: Once | ORAL | Status: DC | PRN

## 2020-08-19 MED ORDER — LIDOCAINE 2% (20 MG/ML) 5 ML SYRINGE
INTRAMUSCULAR | Status: DC | PRN
  Administered 2020-08-19: 60 mg via INTRAVENOUS

## 2020-08-19 MED ORDER — FENTANYL CITRATE (PF) 100 MCG/2ML IJ SOLN
INTRAMUSCULAR | Status: AC
  Filled 2020-08-19: qty 2

## 2020-08-19 MED ORDER — PHENYLEPHRINE HCL (PRESSORS) 10 MG/ML IV SOLN
INTRAVENOUS | Status: DC | PRN
  Administered 2020-08-19: 80 ug via INTRAVENOUS
  Administered 2020-08-19: 120 ug via INTRAVENOUS
  Administered 2020-08-19: 80 ug via INTRAVENOUS
  Administered 2020-08-19: 120 ug via INTRAVENOUS

## 2020-08-19 MED ORDER — FENTANYL CITRATE (PF) 250 MCG/5ML IJ SOLN
INTRAMUSCULAR | Status: AC
  Filled 2020-08-19: qty 5

## 2020-08-19 MED ORDER — ONDANSETRON HCL 4 MG/2ML IJ SOLN
INTRAMUSCULAR | Status: DC | PRN
  Administered 2020-08-19: 4 mg via INTRAVENOUS

## 2020-08-19 MED ORDER — ACETAMINOPHEN 500 MG PO TABS: 1000.0000 mg | ORAL_TABLET | Freq: Once | ORAL | Status: DC | PRN

## 2020-08-19 MED ORDER — CEFAZOLIN SODIUM-DEXTROSE 2-4 GM/100ML-% IV SOLN
2.0000 g | INTRAVENOUS | Status: AC
  Administered 2020-08-19: 2 g via INTRAVENOUS
  Filled 2020-08-19: qty 100

## 2020-08-19 MED ORDER — PROPOFOL 10 MG/ML IV BOLUS
INTRAVENOUS | Status: DC | PRN
  Administered 2020-08-19: 180 mg via INTRAVENOUS

## 2020-08-19 MED ORDER — EPHEDRINE SULFATE 50 MG/ML IJ SOLN
INTRAMUSCULAR | Status: DC | PRN
  Administered 2020-08-19: 10 mg via INTRAVENOUS

## 2020-08-19 MED ORDER — MIDAZOLAM HCL 2 MG/2ML IJ SOLN
INTRAMUSCULAR | Status: AC
  Filled 2020-08-19: qty 2

## 2020-08-19 MED ORDER — ISOSULFAN BLUE 1 % ~~LOC~~ SOLN
SUBCUTANEOUS | Status: DC | PRN
  Administered 2020-08-19: 5 mL via SUBCUTANEOUS

## 2020-08-19 MED ORDER — TECHNETIUM TC 99M TILMANOCEPT KIT
1.0000 | PACK | Freq: Once | INTRAVENOUS | Status: AC | PRN
  Administered 2020-08-19: 1 via INTRADERMAL

## 2020-08-19 MED ORDER — ACETAMINOPHEN 10 MG/ML IV SOLN: 1000.0000 mg | Freq: Once | INTRAVENOUS | Status: DC | PRN

## 2020-08-19 MED ORDER — CHLORHEXIDINE GLUCONATE 0.12 % MT SOLN
15.0000 mL | Freq: Once | OROMUCOSAL | Status: AC
  Administered 2020-08-19: 15 mL via OROMUCOSAL
  Filled 2020-08-19: qty 15

## 2020-08-19 MED ORDER — PROPOFOL 10 MG/ML IV BOLUS
INTRAVENOUS | Status: AC
  Filled 2020-08-19: qty 20

## 2020-08-19 MED ORDER — ORAL CARE MOUTH RINSE: 15.0000 mL | Freq: Once | OROMUCOSAL | Status: AC

## 2020-08-19 MED ORDER — METHYLENE BLUE 0.5 % INJ SOLN
INTRAVENOUS | Status: AC
  Filled 2020-08-19: qty 10

## 2020-08-19 MED ORDER — BUPIVACAINE-EPINEPHRINE 0.25% -1:200000 IJ SOLN
INTRAMUSCULAR | Status: DC | PRN
  Administered 2020-08-19: 20 mL

## 2020-08-19 MED ORDER — FENTANYL CITRATE (PF) 100 MCG/2ML IJ SOLN: 25.0000 ug | INTRAMUSCULAR | Status: DC | PRN

## 2020-08-19 MED ORDER — ACETAMINOPHEN 160 MG/5ML PO SOLN: 1000.0000 mg | Freq: Once | ORAL | Status: DC | PRN

## 2020-08-19 MED ORDER — ACETAMINOPHEN 500 MG PO TABS
1000.0000 mg | ORAL_TABLET | ORAL | Status: AC
  Administered 2020-08-19: 1000 mg via ORAL
  Filled 2020-08-19: qty 2

## 2020-08-19 MED ORDER — BUPIVACAINE-EPINEPHRINE (PF) 0.25% -1:200000 IJ SOLN
INTRAMUSCULAR | Status: AC
  Filled 2020-08-19: qty 30

## 2020-08-19 SURGICAL SUPPLY — 57 items
BLADE SURG 10 STRL SS (BLADE) ×4 IMPLANT
BNDG COHESIVE 4X5 TAN STRL (GAUZE/BANDAGES/DRESSINGS) ×2 IMPLANT
BNDG GAUZE ELAST 4 BULKY (GAUZE/BANDAGES/DRESSINGS) ×2 IMPLANT
CANISTER SUCT 3000ML PPV (MISCELLANEOUS) ×2 IMPLANT
CHLORAPREP W/TINT 26 (MISCELLANEOUS) ×2 IMPLANT
CLIP VESOCCLUDE MED 24/CT (CLIP) IMPLANT
CLIP VESOCCLUDE SM WIDE 24/CT (CLIP) ×2 IMPLANT
CNTNR URN SCR LID CUP LEK RST (MISCELLANEOUS) ×4 IMPLANT
CONT SPEC 4OZ STRL OR WHT (MISCELLANEOUS) ×8
COVER MAYO STAND STRL (DRAPES) IMPLANT
COVER PROBE W GEL 5X96 (DRAPES) ×2 IMPLANT
COVER SURGICAL LIGHT HANDLE (MISCELLANEOUS) ×2 IMPLANT
COVER WAND RF STERILE (DRAPES) ×2 IMPLANT
DECANTER SPIKE VIAL GLASS SM (MISCELLANEOUS) ×2 IMPLANT
DERMABOND ADVANCED (GAUZE/BANDAGES/DRESSINGS) ×2
DERMABOND ADVANCED .7 DNX12 (GAUZE/BANDAGES/DRESSINGS) ×2 IMPLANT
DRAPE HALF SHEET 40X57 (DRAPES) ×2 IMPLANT
DRAPE LAPAROSCOPIC ABDOMINAL (DRAPES) ×2 IMPLANT
DRSG TEGADERM 4X4.75 (GAUZE/BANDAGES/DRESSINGS) ×4 IMPLANT
DRSG TELFA 3X8 NADH (GAUZE/BANDAGES/DRESSINGS) ×2 IMPLANT
ELECT REM PT RETURN 9FT ADLT (ELECTROSURGICAL) ×2
ELECTRODE REM PT RTRN 9FT ADLT (ELECTROSURGICAL) ×1 IMPLANT
GAUZE 4X4 16PLY RFD (DISPOSABLE) ×2 IMPLANT
GAUZE SPONGE 2X2 8PLY STRL LF (GAUZE/BANDAGES/DRESSINGS) ×1 IMPLANT
GLOVE BIO SURGEON STRL SZ 6 (GLOVE) ×2 IMPLANT
GLOVE INDICATOR 6.5 STRL GRN (GLOVE) ×2 IMPLANT
GOWN STRL REUS W/ TWL LRG LVL3 (GOWN DISPOSABLE) ×4 IMPLANT
GOWN STRL REUS W/TWL 2XL LVL3 (GOWN DISPOSABLE) IMPLANT
GOWN STRL REUS W/TWL LRG LVL3 (GOWN DISPOSABLE) ×8
KIT BASIN OR (CUSTOM PROCEDURE TRAY) ×2 IMPLANT
KIT TURNOVER KIT B (KITS) ×2 IMPLANT
MARKER SKIN DUAL TIP RULER LAB (MISCELLANEOUS) ×4 IMPLANT
NEEDLE 18GX1X1/2 (RX/OR ONLY) (NEEDLE) ×2 IMPLANT
NEEDLE 22X1 1/2 (OR ONLY) (NEEDLE) ×2 IMPLANT
NEEDLE FILTER BLUNT 18X 1/2SAF (NEEDLE)
NEEDLE FILTER BLUNT 18X1 1/2 (NEEDLE) IMPLANT
NEEDLE HYPO 25GX1X1/2 BEV (NEEDLE) IMPLANT
NEEDLE SOFT TIP 20GA (NEEDLE) ×2 IMPLANT
NS IRRIG 1000ML POUR BTL (IV SOLUTION) ×2 IMPLANT
PACK GENERAL/GYN (CUSTOM PROCEDURE TRAY) ×2 IMPLANT
PACK UNIVERSAL I (CUSTOM PROCEDURE TRAY) IMPLANT
PAD ARMBOARD 7.5X6 YLW CONV (MISCELLANEOUS) ×4 IMPLANT
PENCIL SMOKE EVACUATOR (MISCELLANEOUS) ×2 IMPLANT
SLING ARM FOAM STRAP XLG (SOFTGOODS) ×2 IMPLANT
SPONGE GAUZE 2X2 STER 10/PKG (GAUZE/BANDAGES/DRESSINGS) ×1
STOCKINETTE IMPERVIOUS 9X36 MD (GAUZE/BANDAGES/DRESSINGS) ×2 IMPLANT
STRIP CLOSURE SKIN 1/2X4 (GAUZE/BANDAGES/DRESSINGS) ×2 IMPLANT
SUT ETHILON 2 0 FS 18 (SUTURE) ×2 IMPLANT
SUT MNCRL AB 4-0 PS2 18 (SUTURE) ×4 IMPLANT
SUT SILK 2 0 PERMA HAND 18 BK (SUTURE) ×2 IMPLANT
SUT VIC AB 2-0 SH 27 (SUTURE)
SUT VIC AB 2-0 SH 27XBRD (SUTURE) IMPLANT
SUT VIC AB 3-0 SH 27 (SUTURE) ×4
SUT VIC AB 3-0 SH 27X BRD (SUTURE) ×2 IMPLANT
SYR CONTROL 10ML LL (SYRINGE) ×4 IMPLANT
TOWEL GREEN STERILE (TOWEL DISPOSABLE) ×2 IMPLANT
TOWEL GREEN STERILE FF (TOWEL DISPOSABLE) ×2 IMPLANT

## 2020-08-19 NOTE — H&P (Signed)
Micheal Moore. is an 69 y.o. male.   Chief Complaint: melanoma HPI: Micheal Moore is a 69 yo male who was recently diagnosed with a malignant melanoma of the right elbow. Path showed an intradermal melanoma with no epithelial component, concerning for a metastatic lesion. Exam revealed no other suspicious lesions, and PET/CT did not show any other sites of hypermetabolic activity. After a discussion at multidisciplinary tumor board the patient was scheduled for wide local excision with sentinel lymph node biopsy. He presents today for surgery.  He has not had any clinical changes since his clinic visit with me on 11/16. COVID test was negative on 11/30.    Past Medical History:  Diagnosis Date  . Cancer ([place])    right elbow melanoma  . Diabetes mellitus without complication ([place])    Type II  . DVT (deep venous thrombosis) ([facility])    right leg    Past Surgical History:  Procedure Laterality Date  . AMPUTATION TOE Left 04/28/2020   Procedure: AMPUTATION LEFT GREAT TOE;  Surgeon: Newt Minion, MD;  Location: [facility];  Service: Orthopedics;  Laterality: Left;    History reviewed. No pertinent family history. Social History:  reports that he has been smoking. He has been smoking about 0.20 packs per day. He has never used smokeless tobacco. He reports previous drug use. He reports that he does not drink alcohol.  Allergies: No Known Allergies  Medications Prior to Admission  Medication Sig Dispense Refill  . aspirin 325 MG tablet Take 325 mg by mouth daily.     Marland Kitchen glimepiride (AMARYL) 2 MG tablet Take 2 mg by mouth daily.     . metFORMIN (GLUCOPHAGE) 500 MG tablet Take 2 tablets (1,000 mg total) by mouth 2 (two) times daily. 120 tablet 0  . rosuvastatin (CRESTOR) 5 MG tablet Take 5 mg by mouth 2 (two) times a week. On Tuesday & Saturday      Results for orders placed or performed during the hospital encounter of 08/19/20 (from the past 48 hour(s))  Glucose, capillary      Status: Abnormal   Collection Time: 08/19/20  6:18 AM  Result Value Ref Range   Glucose-Capillary 218 (H) 70 - 99 mg/dL    Comment: Glucose reference range applies only to samples taken after fasting for at least 8 hours.   Comment 1 Notify RN   Basic metabolic panel per protocol     Status: Abnormal   Collection Time: 08/19/20  6:43 AM  Result Value Ref Range   Sodium 136 135 - 145 mmol/L   Potassium 4.5 3.5 - 5.1 mmol/L   Chloride 103 98 - 111 mmol/L   CO2 21 (L) 22 - 32 mmol/L   Glucose, Bld 296 (H) 70 - 99 mg/dL    Comment: Glucose reference range applies only to samples taken after fasting for at least 8 hours.   BUN 23 8 - 23 mg/dL   Creatinine, Ser 1.01 0.61 - 1.24 mg/dL   Calcium 9.2 8.9 - 10.3 mg/dL   GFR, Estimated >60 >60 mL/min    Comment: (NOTE) Calculated using the CKD-EPI Creatinine Equation (2021)    Anion gap 12 5 - 15    Comment: Performed at [facility] [address] [place], [place] [address]  CBC per protocol     Status: Abnormal   Collection Time: 08/19/20  6:43 AM  Result Value Ref Range   WBC 7.1 4.0 - 10.5 K/uL   RBC  4.03 (L) 4.22 - 5.81 MIL/uL   Hemoglobin 12.7 (L) 13.0 - 17.0 g/dL   HCT 39.9 39 - 52 %   MCV 99.0 80.0 - 100.0 fL   MCH 31.5 26.0 - 34.0 pg   MCHC 31.8 30.0 - 36.0 g/dL   RDW 13.1 11.5 - 15.5 %   Platelets 186 150 - 400 K/uL   nRBC 0.0 0.0 - 0.2 %    Comment: Performed at [facility] [address] [place], [place] [address]   No results found.  Review of Systems  Constitutional: Negative for chills and fever.  Respiratory: Negative for shortness of breath and stridor.   Skin:       Melanoma on left elbow  Allergic/Immunologic: Negative for immunocompromised state.  Neurological: Negative for facial asymmetry and speech difficulty.  Psychiatric/Behavioral: Negative for agitation and confusion.    Blood pressure (!) 150/80, pulse 93, temperature 97.6 F (36.4 C), resp. rate 18, height 6\' 5"  (1.956 m), weight  96.1 kg, SpO2 92 %. Physical Exam Vitals reviewed.  Constitutional:      Appearance: Normal appearance.  HENT:     Head: Normocephalic and atraumatic.  Eyes:     General: No scleral icterus.    Conjunctiva/sclera: Conjunctivae normal.  Pulmonary:     Effort: Pulmonary effort is normal. No respiratory distress.  Musculoskeletal:        General: No deformity. Normal range of motion.     Cervical back: Normal range of motion.  Skin:    General: Skin is warm and dry.     Coloration: Skin is not jaundiced.     Comments: Raised lesion on the right elbow at biopsy-proven site of melanoma.  Neurological:     General: No focal deficit present.     Mental Status: He is alert and oriented to person, place, and time.  Psychiatric:        Mood and Affect: Mood normal.        Behavior: Behavior normal.        Thought Content: Thought content normal.      Assessment/Plan 69 yo male with a malignant melanoma of the right upper extremity. Proceed to OR for wide local excision with right axillary sentinel lymph node biopsy. Nuclear medicine injection completed for lymphoscintigraphy. Informed consent completed, surgical site confirmed. All questions answered. Plan for discharge home from PACU.  Dwan Bolt, MD 08/19/2020, 8:18 AM

## 2020-08-19 NOTE — Transfer of Care (Signed)
Immediate Anesthesia Transfer of Care Note  Patient: Micheal Moore.  Procedure(s) Performed: WIDE LOCAL EXCISION RIGHT ELBOW MELANOMA WITH RIGHT AXILLARY SENTINEL LYMPH NODE BIOPSY (Right Elbow)  Patient Location: PACU  Anesthesia Type:General  Level of Consciousness: awake, alert , oriented and patient cooperative  Airway & Oxygen Therapy: Patient Spontanous Breathing  Post-op Assessment: Report given to RN and Post -op Vital signs reviewed and stable  Post vital signs: Reviewed and stable  Last Vitals:  Vitals Value Taken Time  BP 121/70 08/19/20 1056  Temp    Pulse 93 08/19/20 1057  Resp    SpO2 94 % 08/19/20 1057  Vitals shown include unvalidated device data.  Last Pain:  Vitals:   08/19/20 0701  PainSc: 0-No pain      Patients Stated Pain Goal: 2 (57/90/38 3338)  Complications: No complications documented.

## 2020-08-19 NOTE — Op Note (Addendum)
Date: 08/19/20  Patient: Micheal Moore. MRN: [id number]  Preoperative Diagnosis: Malignant melanoma of right elbow Postoperative Diagnosis: Same  Procedure: 1. Right axillary sentinel lymph node biopsy 2. Wide local excision of malignant melanoma of right upper extremity  Surgeon: Michaelle Birks, MD  EBL: Minimal  Anesthesia: General  Specimens: 1. Right axillary sentinel lymph node #1 2. Right axillary sentinel lymph node #2 3. Right axillary sentinel lymph node #3 4. Right elbow melanoma, single stitch marks superior margin, double stitch marks medial margin  Indications: Mr. Schalk is a 69 yo male who presented with a lesion on the right elbow, and biopsy showed an intradermal melanoma with no epidermal component. Physical exam and PET/CT showed no other primary sites of disease. After a discussion at multidisciplinary tumor board, the decision was made to proceed with wide local excision and right axillary sentinel lymph node biopsy.  Findings: Three right axillary sentinel lymph nodes identified with radiotracer and isosulfan blue dye. Remaining lymph node bed had a <10% count of the highest sentinel node value. Wide local excision of right elbow melanoma with 1.5cm margins, total excised dimensions 10x5cm, primary closure performed.  Procedure details: Informed consent was obtained in the preoperative area prior to the procedure. The patient underwent injection of a sulfur colloid radiotracer at the primary disease site by nuclear medicine prior to the procedure. The patient was brought to the operating room and placed on the table in the supine position. General anesthesia was induced and appropriate lines and drains were placed for intraoperative monitoring. Perioperative antibiotics were administered per SCIP guidelines. 47mL of 1% isosulfan blue were injected into the dermis at the site of the lesion on the right elbow. The axilla and right arm were  circumferentially prepped and draped in the usual sterile fashion. A pre-procedure timeout was taken verifying patient identity, surgical site and procedure to be performed.  A small skin incision was made in the right axilla at the lower part of the hair-bearing skin. The subcutaneous tissue was divided with cautery. The clavipectoral fascia was opened to expose the axilla. The neoprobe was used to identify a sentinel lymph node, which was also blue in appearance. The node was dissected out, ligating the lymphatic vessels with clips. The ex vivo count of the first sentinel node was 988. This specimen was sent for routine pathology. The axilla was probed with the neoprobe and had a count of >200. Two more hot lymph nodes were identified and removed, ligating the lymphatic vessels with clips. The ex vivo counts of each of these nodes was <500. They were sent for routine pathology. The axilla was again probed with the neoprobe and the count at this point was <50 and no further blue nodes were visualized. The wound was irrigated and packed.  Next the primary lesion on the elbow was approached. The raised lesion overlied the lateral epicondyle, and a margin of 1.5cm was measured and marked out medial and lateral to the lesion, to allow the incision to close primarily. 2cm margins were marked superiorly and inferiorly. An elliptical incision was then made along the long axis of the arm to include these margins. The subcutaneous tissue was divided with cautery down to the underlying muscle fascia. The skin and subcutaneous tissue were then taken off the fascia and the specimen was excised. The specimen was oriented with a silk suture and sent for routine pathology. The remaining wound bed was irrigated and hemostasis was achieved with cautery. The elbow was flexed 90  degrees to ensure the skin would close under tension. The skin was reapproximated with 3-0 vicryl interrupted deep dermal sutures. A running subcuticular  4-0 monocryl was placed to close the skin. Three 2-0 nylon horizontal mattress sutures were placed at the central portion of the incision to minimize tension. Next the packing was removed from the axillary wound, which was hemostatic. The clavipectoral fascia was closed with a running 3-0 vicryl. The deep dermal layer was closed with interrupted 3-0 vicryl, and the skin was closed with a running subcuticular 4-0 monocryl suture. Dermabond was applied to both incisions.  The patient tolerated the procedure well with no apparent complications. All counts were correct x2 at the end of the procedure. The patient was extubated and taken to PACU in stable condition.  Michaelle Birks, MD 08/19/20 11:07 AM

## 2020-08-19 NOTE — Discharge Instructions (Addendum)
[facility] DISCHARGE INSTRUCTIONS  Activity . No heavy lifting greater than 10 pounds with your right arm for 4 weeks after surgery. Madaline Brilliant to shower, but do not bathe or submerge incisions underwater. . Do not drive while taking narcotic pain medication. . When you are resting or not using your right arm, please keep it in a sling. This is to help prevent the incision from contracting as it heals.  Wound Care . Your incisions are covered with skin glue called Dermabond. This will peel off on its own over time. . You may shower and allow warm soapy water to run over your incisions in 48 hours after surgery. Gently pat dry. . Do not submerge your incision underwater. . Monitor your incision for any new redness, tenderness, or drainage. . You may notice some soreness under your arm and mild swelling under the incision - this is normal and will improve with time.  When to Call us: Marland Kitchen Fever greater than 100.5 . New redness, drainage, or swelling at incision sites . Severe pain, nausea, or vomiting  Follow-up You have an appointment scheduled with Dr. Zenia Resides on September 07, 2020 at 2:45pm. This will be at the [facility] Surgery office at [address] [place], [place], [place]. Please arrive at least 15 minutes prior to your scheduled appointment time.  For questions or concerns, please call the office at [phone].

## 2020-08-19 NOTE — Anesthesia Preprocedure Evaluation (Signed)
Anesthesia Evaluation  Patient identified by MRN, date of birth, ID band Patient awake    Reviewed: Allergy & Precautions, NPO status , Patient's Chart, lab work & pertinent test results  History of Anesthesia Complications Negative for: history of anesthetic complications  Airway Mallampati: II  TM Distance: >3 FB Neck ROM: Full    Dental  (+) Poor Dentition, Missing, Chipped, Dental Advisory Given   Pulmonary neg shortness of breath, neg sleep apnea, neg COPD, neg recent URI, Current Smoker and Patient abstained from smoking.,  Covid-19 Nucleic Acid Test Results Lab Results      Component                Value               Date                      [place]              NEGATIVE            08/16/2020                [place]              NEGATIVE            04/27/2020              breath sounds clear to auscultation       Cardiovascular negative cardio ROS   Rhythm:Regular     Neuro/Psych negative neurological ROS  negative psych ROS   GI/Hepatic negative GI ROS, Neg liver ROS,   Endo/Other  diabetes, Type 2, Oral Hypoglycemic AgentsLab Results      Component                Value               Date                      HGBA1C                   8.4 (H)             04/27/2020             Renal/GU negative Renal ROSLab Results      Component                Value               Date                      CREATININE               1.01                08/19/2020                Musculoskeletal negative musculoskeletal ROS (+)   Abdominal   Peds  Hematology  (+) Blood dyscrasia, anemia , Lab Results      Component                Value               Date                      WBC  7.1                 08/19/2020                HGB                      12.7 (L)            08/19/2020                HCT                      39.9                08/19/2020                MCV                      99.0                 08/19/2020                PLT                      186                 08/19/2020              Anesthesia Other Findings Mild lower extremity edema  Reproductive/Obstetrics                             Anesthesia Physical Anesthesia Plan  ASA: III  Anesthesia Plan: General   Post-op Pain Management:    Induction: Intravenous  PONV Risk Score and Plan: 1 and Treatment may vary due to age or medical condition and Ondansetron  Airway Management Planned: Oral ETT  Additional Equipment: None  Intra-op Plan:   Post-operative Plan: Extubation in OR  Informed Consent: I have reviewed the patients History and Physical, chart, labs and discussed the procedure including the risks, benefits and alternatives for the proposed anesthesia with the patient or authorized representative who has indicated his/her understanding and acceptance.     Dental advisory given  Plan Discussed with: CRNA and Surgeon  Anesthesia Plan Comments:         Anesthesia Quick Evaluation

## 2020-08-19 NOTE — Anesthesia Procedure Notes (Signed)
Procedure Name: LMA Insertion Date/Time: 08/19/2020 9:21 AM Performed by: Babs Bertin, CRNA Pre-anesthesia Checklist: Patient identified, Emergency Drugs available, Suction available and Patient being monitored Patient Re-evaluated:Patient Re-evaluated prior to induction Oxygen Delivery Method: Circle System Utilized Preoxygenation: Pre-oxygenation with 100% oxygen Induction Type: IV induction Ventilation: Mask ventilation without difficulty LMA: LMA inserted LMA Size: 4.0 Number of attempts: 1 Airway Equipment and Method: Bite block Placement Confirmation: positive ETCO2 Tube secured with: Tape Dental Injury: Teeth and Oropharynx as per pre-operative assessment

## 2020-08-22 ENCOUNTER — Other Ambulatory Visit: Payer: Self-pay

## 2020-08-22 ENCOUNTER — Encounter (HOSPITAL_COMMUNITY): Payer: Self-pay | Admitting: Surgery

## 2020-08-22 NOTE — Anesthesia Postprocedure Evaluation (Signed)
Anesthesia Post Note  Patient: Micheal Moore.  Procedure(s) Performed: WIDE LOCAL EXCISION RIGHT ELBOW MELANOMA WITH RIGHT AXILLARY SENTINEL LYMPH NODE BIOPSY (Right Elbow)     Patient location during evaluation: PACU Anesthesia Type: General Level of consciousness: awake and alert Pain management: pain level controlled Vital Signs Assessment: post-procedure vital signs reviewed and stable Respiratory status: spontaneous breathing, nonlabored ventilation, respiratory function stable and patient connected to nasal cannula oxygen Cardiovascular status: blood pressure returned to baseline and stable Postop Assessment: no apparent nausea or vomiting Anesthetic complications: no   No complications documented.  Last Vitals:  Vitals:   08/19/20 1126 08/19/20 1135  BP: (!) 149/85 (!) 149/88  Pulse: 96 93  Resp: 18 14  Temp:  36.5 C  SpO2: 97% 97%    Last Pain:  Vitals:   08/19/20 1135  PainSc: 0-No pain                 Izora Benn

## 2020-08-25 LAB — SURGICAL PATHOLOGY

## 2020-10-13 ENCOUNTER — Inpatient Hospital Stay: Payer: Medicare HMO | Attending: Oncology | Admitting: Oncology

## 2020-10-13 ENCOUNTER — Other Ambulatory Visit: Payer: Self-pay | Admitting: Oncology

## 2020-10-13 ENCOUNTER — Telehealth: Payer: Self-pay | Admitting: Oncology

## 2020-10-13 NOTE — Telephone Encounter (Signed)
Called pt per 1/27 sch msg - no answer - left message for patient to call back to reschedule appt.   

## 2020-10-13 NOTE — Progress Notes (Deleted)
Hematology and Oncology Follow Up Visit  Micheal Moore [id number] 09/29/1950 69 y.o. 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     Prior Therapy:   He status post shave biopsy completed by Dr. Ronnald Ramp in October 2021.  He status post wide local excision of his right upper extremity tumor and right axillary sentinel lymph node biopsy completed on 08/19/2020.  The final pathology showed T4AN0 with 0 out of 3 lymph nodes involved.  Current therapy: Active surveillance.  Interim History: [name] is here for a follow-up evaluation.  Since last visit, he underwent surgical resection of the upper extremity with sentinel lymph node sampling.     Medications: Updated on review. Current Outpatient Medications  Medication Sig Dispense Refill  . aspirin 325 MG tablet Take 325 mg by mouth daily.     Marland Kitchen glimepiride (AMARYL) 2 MG tablet Take 2 mg by mouth daily.     . metFORMIN (GLUCOPHAGE) 500 MG tablet Take 2 tablets (1,000 mg total) by mouth 2 (two) times daily. 120 tablet 0  . oxyCODONE (OXY IR/ROXICODONE) 5 MG immediate release tablet Take 1 tablet (5 mg total) by mouth every 6 (six) hours as needed for severe pain. 20 tablet 0  . rosuvastatin (CRESTOR) 5 MG tablet Take 5 mg by mouth 2 (two) times a week. On Tuesday & Saturday     No current facility-administered medications for this visit.     Allergies: No Known Allergies    Physical Exam:  ECOG: 0   General appearance: Comfortable appearing without any discomfort Head: Normocephalic without any trauma Oropharynx: Mucous membranes are moist and pink without any thrush or ulcers. Eyes: Pupils are equal and round reactive to light. Lymph nodes: No cervical, supraclavicular, inguinal or axillary lymphadenopathy.   Heart:regular rate and rhythm.  S1 and S2 without leg edema. Lung: Clear  without any rhonchi or wheezes.  No dullness to percussion. Abdomin: Soft, nontender, nondistended with good bowel sounds.  No hepatosplenomegaly. Musculoskeletal: No joint deformity or effusion.  Full range of motion noted. Neurological: No deficits noted on motor, sensory and deep tendon reflex exam. Skin: No petechial rash or dryness.  Appeared moist.      Lab Results: Lab Results  Component Value Date   WBC 7.1 08/19/2020   HGB 12.7 (L) 08/19/2020   HCT 39.9 08/19/2020   MCV 99.0 08/19/2020   PLT 186 08/19/2020     Chemistry      Component Value Date/Time   NA 136 08/19/2020 0643   K 4.5 08/19/2020 0643   CL 103 08/19/2020 0643   CO2 21 (L) 08/19/2020 0643   BUN 23 08/19/2020 0643   CREATININE 1.01 08/19/2020 0643      Component Value Date/Time   CALCIUM 9.2 08/19/2020 0643   ALKPHOS 56 04/26/2020 0848   AST 20 04/26/2020 0848   ALT 20 04/26/2020 0848   BILITOT 0.5 04/26/2020 0848      Impression and Plan:   70 year old man with:  1.    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  Is status post wide excision and sentinel lymph node sampling without any evidence of lymph node involvement.  PET scan did not show any evidence of metastatic disease.   The natural course of this disease was reviewed and risk of relapse was assessed.  Despite the depth of invasion he has no  evidence of ulceration, lymphovascular invasion or lymph node involvement.  Based on these findings, the only adjuvant therapy recommended would be interferon which would be fairly toxic and does not justify the benefit at this time.  The role of immunotherapy in this particular stage is unknown and certainly indicated for high risk lesions and stage IIc.  After discussion, I recommended continued active surveillance with repeat examination in 6 months and imaging studies in 12 months.   2.  Dermatology surveillance: I recommended continued surveillance every 3 to 6  months.  3.  Follow-up: In 6 months for repeat follow-up.   30  minutes were dedicated to this visit.  The time spent on reviewing disease status, reviewing pathology results and treatment options for the future.   Zola Button, MD 1/27/20229:27 AM

## 2020-10-18 DIAGNOSIS — E1140 Type 2 diabetes mellitus with diabetic neuropathy, unspecified: Secondary | ICD-10-CM | POA: Diagnosis not present

## 2020-10-18 DIAGNOSIS — C4361 Malignant melanoma of right upper limb, including shoulder: Secondary | ICD-10-CM | POA: Diagnosis not present

## 2020-10-18 DIAGNOSIS — Z89412 Acquired absence of left great toe: Secondary | ICD-10-CM | POA: Diagnosis not present

## 2020-10-18 DIAGNOSIS — S98112A Complete traumatic amputation of left great toe, initial encounter: Secondary | ICD-10-CM | POA: Diagnosis not present

## 2020-10-18 DIAGNOSIS — J438 Other emphysema: Secondary | ICD-10-CM | POA: Diagnosis not present

## 2020-10-18 DIAGNOSIS — I700 Atherosclerosis of aorta: Secondary | ICD-10-CM | POA: Diagnosis not present

## 2020-10-18 DIAGNOSIS — I2510 Atherosclerotic heart disease of native coronary artery without angina pectoris: Secondary | ICD-10-CM | POA: Diagnosis not present

## 2020-10-18 DIAGNOSIS — I6523 Occlusion and stenosis of bilateral carotid arteries: Secondary | ICD-10-CM | POA: Diagnosis not present

## 2020-10-18 DIAGNOSIS — I2584 Coronary atherosclerosis due to calcified coronary lesion: Secondary | ICD-10-CM | POA: Diagnosis not present

## 2020-10-18 DIAGNOSIS — E785 Hyperlipidemia, unspecified: Secondary | ICD-10-CM | POA: Diagnosis not present

## 2020-12-06 ENCOUNTER — Telehealth: Payer: Self-pay | Admitting: Oncology

## 2020-12-06 DIAGNOSIS — C4361 Malignant melanoma of right upper limb, including shoulder: Secondary | ICD-10-CM | POA: Diagnosis not present

## 2020-12-06 NOTE — Telephone Encounter (Signed)
Scheduled per sch msg. Called and spoke with patients wife. Confirmed appt  

## 2020-12-22 ENCOUNTER — Inpatient Hospital Stay: Payer: Medicare HMO | Attending: Oncology | Admitting: Oncology

## 2020-12-22 ENCOUNTER — Other Ambulatory Visit: Payer: Self-pay

## 2020-12-22 VITALS — BP 138/80 | HR 99 | Temp 94.8°F | Resp 17 | Wt 209.1 lb

## 2020-12-22 DIAGNOSIS — C4361 Malignant melanoma of right upper limb, including shoulder: Secondary | ICD-10-CM | POA: Insufficient documentation

## 2020-12-22 DIAGNOSIS — E119 Type 2 diabetes mellitus without complications: Secondary | ICD-10-CM | POA: Diagnosis not present

## 2020-12-22 DIAGNOSIS — C439 Malignant melanoma of skin, unspecified: Secondary | ICD-10-CM | POA: Diagnosis not present

## 2020-12-22 DIAGNOSIS — Z79899 Other long term (current) drug therapy: Secondary | ICD-10-CM | POA: Diagnosis not present

## 2020-12-22 DIAGNOSIS — Z89412 Acquired absence of left great toe: Secondary | ICD-10-CM | POA: Diagnosis not present

## 2020-12-22 NOTE — Progress Notes (Signed)
Hematology and Oncology Follow Up Visit  Jamauri Kruzel [id number] 19-May-1951 70 y.o. 12/22/2020 3:35 PM [name], MDSouth, Annie Main, MD   Principle Diagnosis: 70 year old man with T4N0 cutaneous melanoma of the right elbow diagnosed in October 2021.     Prior Therapy:   He status post shave biopsy completed by Dr. Ronnald Ramp in October 2021.  He is status post wide local excision of his elbow melanoma as well as right axillary sentinel lymph node biopsy.  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  0 out of 3 lymph nodes are negative at this time.  Current therapy: Active surveillance and consideration for additional therapy.  Interim History: Mr. Denz is here for return follow-up.  Since the last visit, he underwent surgical resection completed on December 3 without any complications.  He has a fully recovered at this time without any new complaints.  He denies any arm pain or discomfort.  He denies skin rashes or lesions.     Medications: Updated on review. Current Outpatient Medications  Medication Sig Dispense Refill  . aspirin 325 MG tablet Take 325 mg by mouth daily.     Marland Kitchen glimepiride (AMARYL) 2 MG tablet Take 2 mg by mouth daily.     . metFORMIN (GLUCOPHAGE) 500 MG tablet Take 2 tablets (1,000 mg total) by mouth 2 (two) times daily. 120 tablet 0  . oxyCODONE (OXY IR/ROXICODONE) 5 MG immediate release tablet Take 1 tablet (5 mg total) by mouth every 6 (six) hours as needed for severe pain. 20 tablet 0  . rosuvastatin (CRESTOR) 5 MG tablet Take 5 mg by mouth 2 (two) times a week. On Tuesday & Saturday     No current facility-administered medications for this visit.     Allergies: No Known Allergies    Physical Exam: Blood pressure 138/80, pulse 99, temperature (!) 94.8 F (34.9 C), temperature source Tympanic, resp. rate 17, weight 209 lb 1.6 oz (94.8 kg), SpO2 99 %.   ECOG: 0    General appearance: Alert, awake without any  distress. Head: Atraumatic without abnormalities Oropharynx: Without any thrush or ulcers. Eyes: No scleral icterus. Lymph nodes: No lymphadenopathy noted in the cervical, supraclavicular, or axillary nodes Heart:regular rate and rhythm, without any murmurs or gallops.   Lung: Clear to auscultation without any rhonchi, wheezes or dullness to percussion. Abdomin: Soft, nontender without any shifting dullness or ascites. Musculoskeletal: No clubbing or cyanosis. Neurological: No motor or sensory deficits. Skin: No rashes or lesions.       Lab Results: Lab Results  Component Value Date   WBC 7.1 08/19/2020   HGB 12.7 (L) 08/19/2020   HCT 39.9 08/19/2020   MCV 99.0 08/19/2020   PLT 186 08/19/2020     Chemistry      Component Value Date/Time   NA 136 08/19/2020 0643   K 4.5 08/19/2020 0643   CL 103 08/19/2020 0643   CO2 21 (L) 08/19/2020 0643   BUN 23 08/19/2020 0643   CREATININE 1.01 08/19/2020 0643      Component Value Date/Time   CALCIUM 9.2 08/19/2020 0643   ALKPHOS 56 04/26/2020 0848   AST 20 04/26/2020 0848   ALT 20 04/26/2020 0848   BILITOT 0.5 04/26/2020 0848      Impression and Plan:   70 year old man with:  1.    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  He was found to have T4N0 tumor after surgical resection with wide local excision and sentinel  lymph node sampling.    His disease status was updated at this time and treatment options were reviewed.  The role of adjuvant immunotherapy in this particular setting was discussed.  Risks and benefits of treatment versus observation were reviewed.  Complication associated with immunotherapy include nausea, fatigue, immune mediated complications.  The benefit is unclear at this time after 4 months of observation in the setting of T4 nodular melanoma.  The benefit that he is likely marginal or unknown.  Alternatively, active surveillance and reinstituting systemic therapy upon relapse was  discussed.  After discussion today, I recommended close surveillance with repeat imaging studies in 3 months.   2.  Follow-up: In 3 months for repeat imaging studies.   30  minutes were dedicated to this visit.  The time was spent on reviewing disease status, discussing treatment options and future plan of care review.   Zola Button, MD 4/7/20223:35 PM

## 2021-01-21 ENCOUNTER — Emergency Department (HOSPITAL_COMMUNITY)
Admission: EM | Admit: 2021-01-21 | Discharge: 2021-01-21 | Disposition: A | Discharge status: Home / Self Care | Payer: Medicare HMO | Attending: Emergency Medicine | Admitting: Emergency Medicine

## 2021-01-21 ENCOUNTER — Emergency Department (HOSPITAL_COMMUNITY): Payer: Medicare HMO

## 2021-01-21 ENCOUNTER — Other Ambulatory Visit: Payer: Self-pay

## 2021-01-21 DIAGNOSIS — W19XXXA Unspecified fall, initial encounter: Secondary | ICD-10-CM | POA: Insufficient documentation

## 2021-01-21 DIAGNOSIS — E1165 Type 2 diabetes mellitus with hyperglycemia: Secondary | ICD-10-CM | POA: Diagnosis not present

## 2021-01-21 DIAGNOSIS — M4802 Spinal stenosis, cervical region: Secondary | ICD-10-CM | POA: Diagnosis not present

## 2021-01-21 DIAGNOSIS — R404 Transient alteration of awareness: Secondary | ICD-10-CM | POA: Diagnosis not present

## 2021-01-21 DIAGNOSIS — S199XXA Unspecified injury of neck, initial encounter: Secondary | ICD-10-CM | POA: Diagnosis not present

## 2021-01-21 DIAGNOSIS — R410 Disorientation, unspecified: Secondary | ICD-10-CM | POA: Diagnosis not present

## 2021-01-21 DIAGNOSIS — S0990XA Unspecified injury of head, initial encounter: Secondary | ICD-10-CM | POA: Diagnosis not present

## 2021-01-21 DIAGNOSIS — M50222 Other cervical disc displacement at C5-C6 level: Secondary | ICD-10-CM | POA: Diagnosis not present

## 2021-01-21 DIAGNOSIS — Y93K9 Activity, other involving animal care: Secondary | ICD-10-CM | POA: Diagnosis not present

## 2021-01-21 DIAGNOSIS — S42031A Displaced fracture of lateral end of right clavicle, initial encounter for closed fracture: Secondary | ICD-10-CM | POA: Diagnosis not present

## 2021-01-21 DIAGNOSIS — S060X1A Concussion with loss of consciousness of 30 minutes or less, initial encounter: Secondary | ICD-10-CM | POA: Diagnosis not present

## 2021-01-21 DIAGNOSIS — M47812 Spondylosis without myelopathy or radiculopathy, cervical region: Secondary | ICD-10-CM | POA: Diagnosis not present

## 2021-01-21 DIAGNOSIS — M25511 Pain in right shoulder: Secondary | ICD-10-CM | POA: Diagnosis not present

## 2021-01-21 DIAGNOSIS — S42034A Nondisplaced fracture of lateral end of right clavicle, initial encounter for closed fracture: Secondary | ICD-10-CM

## 2021-01-21 LAB — CBC WITH DIFFERENTIAL/PLATELET
Abs Immature Granulocytes: 0.05 10*3/uL (ref 0.00–0.07)
Basophils Absolute: 0.1 10*3/uL (ref 0.0–0.1)
Basophils Relative: 1 %
Eosinophils Absolute: 0.5 10*3/uL (ref 0.0–0.5)
Eosinophils Relative: 5 %
HCT: 42.6 % (ref 39.0–52.0)
Hemoglobin: 13.5 g/dL (ref 13.0–17.0)
Immature Granulocytes: 1 %
Lymphocytes Relative: 29 %
Lymphs Abs: 2.5 10*3/uL (ref 0.7–4.0)
MCH: 31.5 pg (ref 26.0–34.0)
MCHC: 31.7 g/dL (ref 30.0–36.0)
MCV: 99.3 fL (ref 80.0–100.0)
Monocytes Absolute: 0.4 10*3/uL (ref 0.1–1.0)
Monocytes Relative: 5 %
Neutro Abs: 5.2 10*3/uL (ref 1.7–7.7)
Neutrophils Relative %: 59 %
Platelets: 205 10*3/uL (ref 150–400)
RBC: 4.29 MIL/uL (ref 4.22–5.81)
RDW: 12.6 % (ref 11.5–15.5)
WBC: 8.7 10*3/uL (ref 4.0–10.5)
nRBC: 0 % (ref 0.0–0.2)

## 2021-01-21 LAB — BASIC METABOLIC PANEL
Anion gap: 9 (ref 5–15)
BUN: 12 mg/dL (ref 8–23)
CO2: 23 mmol/L (ref 22–32)
Calcium: 9 mg/dL (ref 8.9–10.3)
Chloride: 102 mmol/L (ref 98–111)
Creatinine, Ser: 0.99 mg/dL (ref 0.61–1.24)
GFR, Estimated: >60 mL/min (ref >60)
Glucose, Bld: 267 mg/dL — ABNORMAL HIGH (ref 70–99)
Potassium: 4.6 mmol/L (ref 3.5–5.1)
Sodium: 134 mmol/L — ABNORMAL LOW (ref 135–145)

## 2021-01-21 LAB — PROTIME-INR
INR: 1 (ref 0.8–1.2)
Prothrombin Time: 13.7 seconds (ref 11.4–15.2)

## 2021-01-21 MED ORDER — LIDOCAINE-EPINEPHRINE (PF) 2 %-1:200000 IJ SOLN: 10.0000 mL | Freq: Once | INTRAMUSCULAR | Status: DC

## 2021-01-21 MED ORDER — MORPHINE SULFATE (PF) 4 MG/ML IV SOLN: 4.0000 mg | INTRAVENOUS | Status: DC | PRN

## 2021-01-21 MED ORDER — HYDROCODONE-ACETAMINOPHEN 5-325 MG PO TABS: 1.0000 | ORAL_TABLET | Freq: Once | ORAL | Status: DC

## 2021-01-21 MED ORDER — HYDROCODONE-ACETAMINOPHEN 5-325 MG PO TABS: 1.0000 | ORAL_TABLET | Freq: Four times a day (QID) | ORAL | 0 refills | Status: DC | PRN

## 2021-01-21 NOTE — ED Provider Notes (Signed)
[facility] EMERGENCY DEPARTMENT Provider Note   CSN: [id number] Arrival date & time: 01/21/21  1034     History No chief complaint on file.   Micheal Moore is a 70 y.o. male.  HPI Patient was chasing his dog when he fell.  His wife had gone the other direction.  He was briefly unresponsive.  On EMS arrival patient was awake but slightly confused.  Patient is not on blood thinners.  He reports that he remembers chasing the dog but he does not remember falling or what happened after that.  Patient is able to answer correctly his name, his at home address, the date.  He reports he does have some pain on the right side of his head.  He reports most of his pain however is in the right shoulder.  He denies any numbness or tingling into the arms or the legs.  Denies chest pain abdominal pain or difficulty breathing.    No past medical history on file.  There are no problems to display for this patient.        No family history on file.     Home Medications Prior to Admission medications   Medication Sig Start Date End Date Taking? Authorizing Provider  HYDROcodone-acetaminophen (NORCO/VICODIN) 5-325 MG tablet Take 1-2 tablets by mouth every 6 (six) hours as needed for moderate pain or severe pain. 01/21/21  Yes Charlesetta Shanks, MD    Allergies    Patient has no allergy information on record.  Review of Systems   Review of Systems 10 systems reviewed and negative except as per HPI Physical Exam Updated Vital Signs BP 128/88   Pulse 88   Temp 98.4 F (36.9 C) (Oral)   Resp 16   Ht 6\' 5"  (1.956 m)   Wt 97.5 kg   SpO2 95%   BMI 25.50 kg/m   Physical Exam Constitutional:      Comments: Alert, GCS 15.  Following commands appropriately.  No respiratory distress.  C-collar in place and bandage on head.  HENT:     Head:     Comments: About a 4 cm linear petechial abrasion with some hematoma over the right parietal scalp and temple.  No laceration.     Nose: Nose normal.     Mouth/Throat:     Mouth: Mucous membranes are moist.     Pharynx: Oropharynx is clear.  Eyes:     Extraocular Movements: Extraocular movements intact.     Pupils: Pupils are equal, round, and reactive to light.  Neck:     Comments: No C-spine tenderness to palpation Cardiovascular:     Rate and Rhythm: Normal rate and regular rhythm.  Pulmonary:     Effort: Pulmonary effort is normal.     Breath sounds: Normal breath sounds.  Chest:     Chest wall: No tenderness.  Abdominal:     General: There is no distension.     Palpations: Abdomen is soft.     Tenderness: There is no abdominal tenderness. There is no guarding.  Musculoskeletal:        General: Normal range of motion.     Comments: Patient does have tenderness over the distal aspect of the clavicle.  He can perform some range of motion of the shoulder although it is painful on the right.  Normal range of motion lower extremities and left upper extremity.  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  No peripheral edema.  Skin:  General: Skin is warm and dry.  Neurological:     General: No focal deficit present.     Mental Status: He is oriented to person, place, and time.     Cranial Nerves: No cranial nerve deficit.     Sensory: No sensory deficit.     Motor: No weakness.     Coordination: Coordination normal.  Psychiatric:        Mood and Affect: Mood normal.     ED Results / Procedures / Treatments   Labs (all labs ordered are listed, but only abnormal results are displayed) Labs Reviewed  BASIC METABOLIC PANEL - Abnormal; Notable for the following components:      Result Value   Sodium 134 (*)    Glucose, Bld 267 (*)    All other components within normal limits  CBC WITH DIFFERENTIAL/PLATELET  PROTIME-INR    EKG None  Radiology CT Head Wo Contrast  Result Date: 01/21/2021 CLINICAL DATA:  Fall with minor head and neck trauma. Patient on blood thinners. EXAM: CT HEAD  WITHOUT CONTRAST CT CERVICAL SPINE WITHOUT CONTRAST TECHNIQUE: Multidetector CT imaging of the head and cervical spine was performed following the standard protocol without intravenous contrast. Multiplanar CT image reconstructions of the cervical spine were also generated. COMPARISON:  Head CT 03/22/2019 FINDINGS: CT HEAD FINDINGS Brain: Ventricles, cisterns and other CSF spaces are normal. No mass, mass effect, shift of midline structures or acute hemorrhage. No evidence of acute infarction. Vascular: No hyperdense vessel or unexpected calcification. Skull: Normal. Negative for fracture or focal lesion. Sinuses/Orbits: No acute finding. Other: None. CT CERVICAL SPINE FINDINGS Alignment: Slight reversal of the normal cervical lordosis with mild lateral bending to the right. No posttraumatic subluxation. Skull base and vertebrae: Vertebral body heights are maintained. There is moderate spondylosis throughout the cervical spine. There is uncovertebral joint spurring and facet arthropathy. Facet arthropathy is worse over the right side. No acute fracture or subluxation. Prevertebral soft tissues are normal. Atlantoaxial articulation is unremarkable. Significant right-sided neural foraminal narrowing from the C3-4 level to the C6-7 level due to adjacent bony spurring. Soft tissues and spinal canal: Mild degree of canal stenosis at the C3-4, C5-6 and C6-7 levels due to spurring and broad-based disc bulge. Prevertebral soft tissues are normal. Disc levels:  Mild disc space narrowing at the C5-6 and C6-7 levels. Upper chest: No acute findings. Other: Calcified plaque over the carotid artery bifurcations right worse than left. IMPRESSION: 1. No acute brain injury. 2. No acute cervical spine injury. 3. 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 Mild  degree of canal stenosis at the C3-4, C5-6 and C6-7 levels. Electronically Signed   By: Marin Olp M.D.   On: 01/21/2021 11:26   CT Cervical Spine Wo Contrast  Result Date: 01/21/2021 CLINICAL DATA:  Fall with minor head and neck trauma. Patient on blood thinners. EXAM: CT HEAD WITHOUT CONTRAST CT CERVICAL SPINE WITHOUT CONTRAST TECHNIQUE: Multidetector CT imaging of the head and cervical spine was performed following the standard protocol without intravenous contrast. Multiplanar CT image reconstructions of the cervical spine were also generated. COMPARISON:  Head CT 03/22/2019 FINDINGS: CT HEAD FINDINGS Brain: Ventricles, cisterns and other CSF spaces are normal. No mass, mass effect, shift of midline structures or acute hemorrhage. No evidence of acute infarction. Vascular: No hyperdense vessel or unexpected  calcification. Skull: Normal. Negative for fracture or focal lesion. Sinuses/Orbits: No acute finding. Other: None. CT CERVICAL SPINE FINDINGS Alignment: Slight reversal of the normal cervical lordosis with mild lateral bending to the right. No posttraumatic subluxation. Skull base and vertebrae: Vertebral body heights are maintained. There is moderate spondylosis throughout the cervical spine. There is uncovertebral joint spurring and facet arthropathy. Facet arthropathy is worse over the right side. No acute fracture or subluxation. Prevertebral soft tissues are normal. Atlantoaxial articulation is unremarkable. Significant right-sided neural foraminal narrowing from the C3-4 level to the C6-7 level due to adjacent bony spurring. Soft tissues and spinal canal: Mild degree of canal stenosis at the C3-4, C5-6 and C6-7 levels due to spurring and broad-based disc bulge. Prevertebral soft tissues are normal. Disc levels:  Mild disc space narrowing at the C5-6 and C6-7 levels. Upper chest: No acute findings. Other: Calcified plaque over the carotid artery bifurcations right worse than left. IMPRESSION: 1.  No acute brain injury. 2. No acute cervical spine injury. 3. 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 Mild degree of canal stenosis at the C3-4, C5-6 and C6-7 levels. Electronically Signed   By: Marin Olp M.D.   On: 01/21/2021 11:26   DG Chest Portable 1 View  Result Date: 01/21/2021 CLINICAL DATA:  Fall.  Right shoulder pain. EXAM: PORTABLE CHEST 1 VIEW COMPARISON:  Images from the prior exam, 03/22/2019, could not be viewed. FINDINGS: Cardiac silhouette normal in size.  No mediastinal or hilar masses. Prominent bronchovascular markings. No convincing pneumonia or pulmonary edema. No pleural effusion or pneumothorax. Nondisplaced, non comminuted fracture of the distal right clavicle. IMPRESSION: 1. Nondisplaced, non comminuted fracture of the distal right clavicle. 2. No acute cardiopulmonary disease. Electronically Signed   By: Lajean Manes M.D.   On: 01/21/2021 10:59   DG Shoulder Right Portable  Result Date: 01/21/2021 CLINICAL DATA:  Fall.  Right shoulder pain. EXAM: PORTABLE RIGHT SHOULDER COMPARISON:  None. FINDINGS: Two views of the right shoulder show a non comminuted fracture of the distal right clavicle, on the Y scapular view, proximal fracture component displaced 5 mm superior to the distal component. No other fractures. AC joint mildly narrowed with marginal spurring. Glenohumeral joint shows marginal spurring from the inferior humeral head and inferior glenoid. Glenohumeral joint normally aligned. Surrounding soft tissues show axillary region vascular calcifications but are otherwise unremarkable. IMPRESSION: 1. Minimally displaced, non comminuted fracture of the distal right clavicle. 2. No other fractures.  No dislocation. 3. Mild AC joint osteoarthritis and mild glenohumeral joint arthropathic changes.  Electronically Signed   By: Lajean Manes M.D.   On: 01/21/2021 11:01    Procedures Procedures   Medications Ordered in ED Medications  morphine 4 MG/ML injection 4 mg (has no administration in time range)  HYDROcodone-acetaminophen (NORCO/VICODIN) 5-325 MG per tablet 1 tablet (0 tablets Oral Hold 01/21/21 1520)    ED Course  I have reviewed the triage vital signs and the nursing notes.  Pertinent labs & imaging results that were available during my care of the patient were reviewed by me and considered in my medical decision making (see chart for details).    MDM Rules/Calculators/A&P                          I have reviewed  the results with the patient's wife on the phone.  She will come to pick him up.  We reviewed plan for hydrocodone 1 tablet every 6 hours for pain.  2 tablets if needed.  Patient had a mechanical fall chasing the dog.  He did exhibit postconcussive symptoms with amnesia surrounding the event.  He did recall what he had been doing just prior to the episode.  He also has good recall for basic demographics and orientation.  No focal motor exam deficits.  CT does not show intracranial injury.  The spine without any fracture.  Minimally displaced distal clavicle fracture with patient be neurovascularly intact.  Will place sling and prescribed Vicodin for pain.  Return precautions reviewed. Final Clinical Impression(s) / ED Diagnoses Final diagnoses:  Fall, initial encounter  Concussion with loss of consciousness of 30 minutes or less, initial encounter  Closed nondisplaced fracture of acromial end of right clavicle, initial encounter    Rx / DC Orders ED Discharge Orders         Ordered    HYDROcodone-acetaminophen (NORCO/VICODIN) 5-325 MG tablet  Every 6 hours PRN        01/21/21 1532           Charlesetta Shanks, MD 01/21/21 1538

## 2021-01-21 NOTE — Progress Notes (Signed)
Orthopedic Tech Progress Note Patient Details:  Micheal Moore [birth year] [id number] Level 2 trauma Patient ID: Micheal Moore, male   DOB: 06/27/1951, 70 y.o.   MRN: [id number]   [name] 01/21/2021, 11:03 AM

## 2021-01-21 NOTE — Discharge Instructions (Signed)
1.  Follow instructions for concussion.  Return immediately if you have bad headache, confusion, problems with your vision, vomiting or other concerning symptoms. 2.  You have a clavicle fracture.  These typically heal on their own.  Wear a sling for comfort and follow-up with your doctor.  Apply a well wrapped ice pack for 20 minutes every 2 hours for the next 2 days. 3.  Schedule a follow-up appointment with your family doctor within 2 days.  You should have a recheck to determine how your concussion symptoms are doing.  If you are having any persisting or concerning symptoms you may require referral to a neurologist. 4.  You have multiple abrasions.  Keep these clean and dry.  Apply antibiotic ointment twice a day.

## 2021-01-21 NOTE — ED Notes (Signed)
Pt discharge instructions reviewed with the patient. The patient verbalized understanding of instructions. Patient discharged.

## 2021-01-21 NOTE — Progress Notes (Signed)
   01/21/21 1025  Clinical Encounter Type  Visited With Patient not available  Visit Type Trauma  Referral From Nurse  Consult/Referral To Chaplain   Chaplain responded to Level 2 trauma. Pt being treated and no support person present. Chaplain paged for another patient, however, remains available.   This note was prepared by Chaplain Resident, Dante Gang, MDiv. Chaplain remains available as needed through the on-call pager: [phone].

## 2021-02-16 DIAGNOSIS — J438 Other emphysema: Secondary | ICD-10-CM | POA: Diagnosis not present

## 2021-02-16 DIAGNOSIS — E785 Hyperlipidemia, unspecified: Secondary | ICD-10-CM | POA: Diagnosis not present

## 2021-02-16 DIAGNOSIS — E1140 Type 2 diabetes mellitus with diabetic neuropathy, unspecified: Secondary | ICD-10-CM | POA: Diagnosis not present

## 2021-02-16 DIAGNOSIS — I700 Atherosclerosis of aorta: Secondary | ICD-10-CM | POA: Diagnosis not present

## 2021-02-16 DIAGNOSIS — I2584 Coronary atherosclerosis due to calcified coronary lesion: Secondary | ICD-10-CM | POA: Diagnosis not present

## 2021-02-16 DIAGNOSIS — C4361 Malignant melanoma of right upper limb, including shoulder: Secondary | ICD-10-CM | POA: Diagnosis not present

## 2021-02-16 DIAGNOSIS — I2510 Atherosclerotic heart disease of native coronary artery without angina pectoris: Secondary | ICD-10-CM | POA: Diagnosis not present

## 2021-02-16 DIAGNOSIS — S98112A Complete traumatic amputation of left great toe, initial encounter: Secondary | ICD-10-CM | POA: Diagnosis not present

## 2021-02-16 DIAGNOSIS — I6523 Occlusion and stenosis of bilateral carotid arteries: Secondary | ICD-10-CM | POA: Diagnosis not present

## 2021-03-23 ENCOUNTER — Ambulatory Visit (HOSPITAL_COMMUNITY)
Admission: RE | Admit: 2021-03-23 | Discharge: 2021-03-23 | Disposition: A | Discharge status: Home / Self Care | Payer: Medicare HMO | Source: Ambulatory Visit | Attending: Oncology | Admitting: Oncology

## 2021-03-23 ENCOUNTER — Inpatient Hospital Stay: Payer: Medicare HMO | Attending: Oncology

## 2021-03-23 ENCOUNTER — Other Ambulatory Visit: Payer: Self-pay

## 2021-03-23 DIAGNOSIS — I2510 Atherosclerotic heart disease of native coronary artery without angina pectoris: Secondary | ICD-10-CM | POA: Insufficient documentation

## 2021-03-23 DIAGNOSIS — C439 Malignant melanoma of skin, unspecified: Secondary | ICD-10-CM | POA: Diagnosis not present

## 2021-03-23 DIAGNOSIS — I700 Atherosclerosis of aorta: Secondary | ICD-10-CM | POA: Diagnosis not present

## 2021-03-23 LAB — GLUCOSE, CAPILLARY: Glucose-Capillary: 164 mg/dL — ABNORMAL HIGH (ref 70–99)

## 2021-03-23 MED ORDER — FLUDEOXYGLUCOSE F - 18 (FDG) INJECTION
11.1000 | Freq: Once | INTRAVENOUS | Status: AC
  Administered 2021-03-23: 10.64 via INTRAVENOUS

## 2021-03-30 ENCOUNTER — Inpatient Hospital Stay (HOSPITAL_BASED_OUTPATIENT_CLINIC_OR_DEPARTMENT_OTHER): Payer: Medicare HMO | Admitting: Oncology

## 2021-03-30 DIAGNOSIS — C439 Malignant melanoma of skin, unspecified: Secondary | ICD-10-CM | POA: Diagnosis not present

## 2021-03-30 NOTE — Progress Notes (Signed)
fs

## 2021-03-30 NOTE — Progress Notes (Signed)
Hematology and Oncology Follow Up for Telemedicine Visits  Micheal Moore [id number] Aug 04, 1951 70 y.o. 03/30/2021 10:50 AM Micheal Moore, MDSouth, Micheal Main, MD   I connected with Micheal Moore on 03/30/21 at 10:30 AM EDT by telephone visit and verified that I am speaking with the correct person using two identifiers.   I discussed the limitations, risks, security and privacy concerns of performing an evaluation and management service by telemedicine and the availability of in-person appointments. I also discussed with the patient that there may be a patient responsible charge related to this service. The patient expressed understanding and agreed to proceed.  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     Prior Therapy:    He status post shave biopsy completed by Dr. Ronnald Moore in October 2021.   He is status post wide local excision of his elbow melanoma as well as right axillary sentinel lymph node biopsy.  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  0 out of 3 lymph nodes are negative at this time.   Current therapy: Active surveillance     Interim History: Micheal Moore reports feeling well without any major complaints.  He denies any new skin rashes or lesions.  He denies any issues with his surgical resection site.  He continues to be active and attends to activities of daily living.     Medications: I have reviewed the patient's current medications.  Current Outpatient Medications  Medication Sig Dispense Refill   aspirin 325 MG tablet Take 325 mg by mouth daily.      glimepiride (AMARYL) 2 MG tablet Take 2 mg by mouth daily.      HYDROcodone-acetaminophen (NORCO/VICODIN) 5-325 MG tablet Take 1-2 tablets  by mouth every 6 (six) hours as needed for moderate pain or severe pain. 20 tablet 0   metFORMIN (GLUCOPHAGE) 500 MG tablet Take 2 tablets (1,000 mg total) by mouth 2 (two) times daily. 120 tablet 0   oxyCODONE (OXY IR/ROXICODONE) 5 MG immediate release tablet Take 1 tablet (5 mg total) by mouth every 6 (six) hours as needed for severe pain. 20 tablet 0   rosuvastatin (CRESTOR) 5 MG tablet Take 5 mg by mouth 2 (two) times a week. On Tuesday & Saturday     No current facility-administered medications for this visit.         Lab Results: Lab Results  Component Value Date   WBC 8.7 01/21/2021   HGB 13.5 01/21/2021   HCT 42.6 01/21/2021   MCV 99.3 01/21/2021   PLT 205 01/21/2021     Chemistry      Component Value Date/Time   NA 134 (L) 01/21/2021 1051   K 4.6 01/21/2021 1051   CL 102 01/21/2021 1051   CO2 23 01/21/2021 1051   BUN 12 01/21/2021 1051   CREATININE 0.99 01/21/2021 1051      Component Value Date/Time   CALCIUM 9.0 01/21/2021 1051   ALKPHOS 56 04/26/2020 0848   AST 20 04/26/2020 0848   ALT 20 04/26/2020 0848   BILITOT 0.5 04/26/2020 0848       Radiological Studies:   IMPRESSION: 1. No evidence of local melanoma recurrence at the RIGHT elbow wide local excision site. 2. No evidence of metastatic adenopathy in the RIGHT axilla. 3. No  evidence of distant metastatic disease. 4. Coronary artery calcification and Aortic Atherosclerosis (ICD10-I70.0).  Impression and Plan:  70 year old man with:   1.    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      He is currently on active surveillance after opting against adjuvant therapy.  PET scan obtained on March 23, 2021 was personally reviewed and discussed with the patient today and showed no evidence of metastatic disease.  Based on these findings, I recommended continued active surveillance and systemic therapy will be used if he developed relapsed disease.  The plan is to reevaluate him  in 6 months and tentatively schedule imaging studies in the next 6 to 12 months.  2.  Dermatology surveillance: I recommended continue to follow with dermatology for routine skin exams.     3.  Follow-up: In 6 months for repeat follow-up.    I discussed the assessment and treatment plan with the patient. The patient was provided an opportunity to ask questions and all were answered. The patient agreed with the plan and demonstrated an understanding of the instructions.   The patient was advised to call back or seek an in-person evaluation if the symptoms worsen or if the condition fails to improve as anticipated.  I provided 20 minutes of non face-to-face telephone visit time during this encounter.  The time was dedicated to reviewing laboratory data, imaging studies, disease status update and future plan of care management.  Micheal Button, MD 03/30/2021 10:50 AM

## 2021-07-05 DIAGNOSIS — I2584 Coronary atherosclerosis due to calcified coronary lesion: Secondary | ICD-10-CM | POA: Diagnosis not present

## 2021-07-05 DIAGNOSIS — E1140 Type 2 diabetes mellitus with diabetic neuropathy, unspecified: Secondary | ICD-10-CM | POA: Diagnosis not present

## 2021-07-05 DIAGNOSIS — C4361 Malignant melanoma of right upper limb, including shoulder: Secondary | ICD-10-CM | POA: Diagnosis not present

## 2021-07-05 DIAGNOSIS — E785 Hyperlipidemia, unspecified: Secondary | ICD-10-CM | POA: Diagnosis not present

## 2021-07-05 DIAGNOSIS — I700 Atherosclerosis of aorta: Secondary | ICD-10-CM | POA: Diagnosis not present

## 2021-07-05 DIAGNOSIS — S98112A Complete traumatic amputation of left great toe, initial encounter: Secondary | ICD-10-CM | POA: Diagnosis not present

## 2021-07-05 DIAGNOSIS — I6523 Occlusion and stenosis of bilateral carotid arteries: Secondary | ICD-10-CM | POA: Diagnosis not present

## 2021-07-05 DIAGNOSIS — I2510 Atherosclerotic heart disease of native coronary artery without angina pectoris: Secondary | ICD-10-CM | POA: Diagnosis not present

## 2021-07-05 DIAGNOSIS — J438 Other emphysema: Secondary | ICD-10-CM | POA: Diagnosis not present

## 2021-09-04 ENCOUNTER — Ambulatory Visit: Payer: Medicare HMO | Admitting: Diagnostic Neuroimaging

## 2021-09-04 ENCOUNTER — Encounter: Payer: Self-pay | Admitting: Diagnostic Neuroimaging

## 2021-10-25 DIAGNOSIS — E1140 Type 2 diabetes mellitus with diabetic neuropathy, unspecified: Secondary | ICD-10-CM | POA: Diagnosis not present

## 2021-10-25 DIAGNOSIS — I2584 Coronary atherosclerosis due to calcified coronary lesion: Secondary | ICD-10-CM | POA: Diagnosis not present

## 2021-10-25 DIAGNOSIS — E785 Hyperlipidemia, unspecified: Secondary | ICD-10-CM | POA: Diagnosis not present

## 2021-10-25 DIAGNOSIS — G4733 Obstructive sleep apnea (adult) (pediatric): Secondary | ICD-10-CM | POA: Diagnosis not present

## 2021-10-25 DIAGNOSIS — I6523 Occlusion and stenosis of bilateral carotid arteries: Secondary | ICD-10-CM | POA: Diagnosis not present

## 2021-10-25 DIAGNOSIS — C4361 Malignant melanoma of right upper limb, including shoulder: Secondary | ICD-10-CM | POA: Diagnosis not present

## 2021-10-25 DIAGNOSIS — R42 Dizziness and giddiness: Secondary | ICD-10-CM | POA: Diagnosis not present

## 2021-10-25 DIAGNOSIS — I700 Atherosclerosis of aorta: Secondary | ICD-10-CM | POA: Diagnosis not present

## 2021-10-25 DIAGNOSIS — S98112A Complete traumatic amputation of left great toe, initial encounter: Secondary | ICD-10-CM | POA: Diagnosis not present

## 2021-11-07 ENCOUNTER — Ambulatory Visit: Payer: Medicare HMO | Admitting: Diagnostic Neuroimaging

## 2021-11-07 ENCOUNTER — Encounter: Payer: Self-pay | Admitting: *Deleted

## 2021-11-07 ENCOUNTER — Other Ambulatory Visit: Payer: Self-pay

## 2021-11-07 VITALS — BP 132/82 | HR 107 | Ht 77.0 in | Wt 195.6 lb

## 2021-11-07 DIAGNOSIS — R42 Dizziness and giddiness: Secondary | ICD-10-CM | POA: Diagnosis not present

## 2021-11-07 DIAGNOSIS — R55 Syncope and collapse: Secondary | ICD-10-CM | POA: Diagnosis not present

## 2021-11-07 NOTE — Patient Instructions (Signed)
°  Orthostatic lightheadedness (could be related to volume depletion, diabetic autonomic neuropathy, cardiogenic) - check orthostatic vitals; stay hydrated - refer to cardiology (twin sister had aortic valve stenosis and syncope; they are concerned about similar diagnosis)

## 2021-11-07 NOTE — Progress Notes (Signed)
[facility]  PATIENT: Micheal Moore. DOB: 17-Nov-1950  REFERRING CLINICIAN: [name], MD HISTORY FROM: PATIENT REASON FOR VISIT: NEW CONSULT   HISTORICAL  CHIEF COMPLAINT:  Chief Complaint  Patient presents with   Dizziness    Rm 7 New Pt sister- [name] "falls, passed out twice"    HISTORY OF PRESENT ILLNESS:   71 year old male here for evaluation of dizziness.  July 2020 patient was working outdoors, overheated, passed out.  Went to the ER was diagnosed with heatstroke.  May 2022 patient stood up, was chasing his dog, felt lightheaded and dizzy and may have passed out briefly.  Went to the hospital for evaluation and was diagnosed with possible mild concussion.  Since then has had at least 7 other episodes of standing up, feeling lightheaded, legs feeling wobbly and him falling down.  Patient is here with his twin sister.  She had similar episodes of lightheadedness and passing out, was diagnosed with aortic valve stenosis and treated with surgery.  She is concerned about similar diagnosis for the patient.  Patient also has history of diabetes with hemoglobin A1c's ranging from 7-8.    REVIEW OF SYSTEMS: Full 14 system review of systems performed and negative with exception of: as per HPI.  ALLERGIES: No Known Allergies  HOME MEDICATIONS: Outpatient Medications Prior to Visit  Medication Sig Dispense Refill   aspirin 325 MG tablet Take 325 mg by mouth daily.      glimepiride (AMARYL) 2 MG tablet Take 2 mg by mouth daily.      HYDROcodone-acetaminophen (NORCO/VICODIN) 5-325 MG tablet Take 1-2 tablets by mouth every 6 (six) hours as needed for moderate pain or severe pain. 20 tablet 0   metFORMIN (GLUCOPHAGE) 500 MG tablet Take 2 tablets (1,000 mg total) by mouth 2 (two) times daily. 120 tablet 0   rosuvastatin (CRESTOR) 5 MG tablet Take 5 mg by mouth 2 (two) times a week. On Tuesday & Saturday     oxyCODONE (OXY IR/ROXICODONE) 5 MG  immediate release tablet Take 1 tablet (5 mg total) by mouth every 6 (six) hours as needed for severe pain. 20 tablet 0   No facility-administered medications prior to visit.    PAST MEDICAL HISTORY: Past Medical History:  Diagnosis Date   Cancer ([place])    right elbow melanoma   Diabetes mellitus without complication ([place])    Type II   Dizziness    DVT (deep venous thrombosis) (HCC)    right leg   Frequent falls     PAST SURGICAL HISTORY: Past Surgical History:  Procedure Laterality Date   AMPUTATION TOE Left 04/28/2020   Procedure: AMPUTATION LEFT GREAT TOE;  Surgeon: Newt Minion, MD;  Location: [place];  Service: Orthopedics;  Laterality: Left;   MELANOMA EXCISION WITH SENTINEL LYMPH NODE BIOPSY Right 08/19/2020   Procedure: WIDE LOCAL EXCISION RIGHT ELBOW MELANOMA WITH RIGHT AXILLARY SENTINEL LYMPH NODE BIOPSY;  Surgeon: Dwan Bolt, MD;  Location: [place];  Service: General;  Laterality: Right;  2nd incision in axilla    FAMILY HISTORY: Family History  Problem Relation Age of Onset   Stroke Mother    Diabetes Father    Stroke Sister    Diabetes Sister     SOCIAL HISTORY: Social History   Socioeconomic History   Marital status: Married    Spouse name: [name]   Number of children: 1   Years of education: Not on file   Highest education level: Associate degree: occupational,  technical, or vocational program  Occupational History    Comment: retired  Tobacco Use   Smoking status: Every Day    Packs/day: 0.20    Years: 19.00    Pack years: 3.80    Types: Cigarettes   Smokeless tobacco: Never  Vaping Use   Vaping Use: Never used  Substance and Sexual Activity   Alcohol use: No   Drug use: Not Currently   Sexual activity: Not on file  Other Topics Concern   Not on file  Social History Narrative   Lives with wife   Social Determinants of Health   Financial Resource Strain: Not on file  Food Insecurity: Not on file  Transportation Needs: Not on file   Physical Activity: Not on file  Stress: Not on file  Social Connections: Not on file  Intimate Partner Violence: Not on file     PHYSICAL EXAM  GENERAL EXAM/CONSTITUTIONAL: Vitals:  Orthostatic VS for the past 24 hrs (Last 3 readings):  BP- Lying Pulse- Lying BP- Standing at 3 minutes Pulse- Standing at 3 minutes  11/07/21 1416 (!) 143/98 105 98/67 115    Vitals:   11/07/21 1310  BP: 132/82  Pulse: (!) 107  Weight: 195 lb 9.6 oz (88.7 kg)  Height: 6\' 5"  (1.956 m)   Body mass index is 23.19 kg/m. Wt Readings from Last 3 Encounters:  11/07/21 195 lb 9.6 oz (88.7 kg)  01/21/21 215 lb (97.5 kg)  12/22/20 209 lb 1.6 oz (94.8 kg)   Patient is in no distress; well developed, nourished and groomed; neck is supple  CARDIOVASCULAR: Examination of carotid arteries is normal; no carotid bruits Regular rate and rhythm, no murmurs Examination of peripheral vascular system by observation and palpation is normal  EYES: Ophthalmoscopic exam of optic discs and posterior segments is normal; no papilledema or hemorrhages No results found.  MUSCULOSKELETAL: Gait, strength, tone, movements noted in Neurologic exam below  NEUROLOGIC: MENTAL STATUS:  No flowsheet data found. awake, alert, oriented to person, place and time recent and remote memory intact normal attention and concentration language fluent, comprehension intact, naming intact fund of knowledge appropriate  CRANIAL NERVE:  2nd - no papilledema on fundoscopic exam 2nd, 3rd, 4th, 6th - pupils equal and reactive to light, visual fields full to confrontation, extraocular muscles intact, no nystagmus 5th - facial sensation symmetric 7th - facial strength symmetric 8th - hearing intact 9th - palate elevates symmetrically, uvula midline 11th - shoulder shrug symmetric 12th - tongue protrusion midline  MOTOR:  normal bulk and tone, full strength in the BUE, BLE  SENSORY:  normal and symmetric to light touch,  temperature, vibration  COORDINATION:  finger-nose-finger, fine finger movements normal  REFLEXES:  deep tendon reflexes TRACE and symmetric  GAIT/STATION:  narrow based gait     DIAGNOSTIC DATA (LABS, IMAGING, TESTING) - I reviewed patient records, labs, notes, testing and imaging myself where available.  Lab Results  Component Value Date   WBC 8.7 01/21/2021   HGB 13.5 01/21/2021   HCT 42.6 01/21/2021   MCV 99.3 01/21/2021   PLT 205 01/21/2021      Component Value Date/Time   NA 134 (L) 01/21/2021 1051   K 4.6 01/21/2021 1051   CL 102 01/21/2021 1051   CO2 23 01/21/2021 1051   GLUCOSE 267 (H) 01/21/2021 1051   BUN 12 01/21/2021 1051   CREATININE 0.99 01/21/2021 1051   CALCIUM 9.0 01/21/2021 1051   PROT 6.6 04/26/2020 0848   ALBUMIN 3.2 (L)  04/26/2020 0848   AST 20 04/26/2020 0848   ALT 20 04/26/2020 0848   ALKPHOS 56 04/26/2020 0848   BILITOT 0.5 04/26/2020 0848   GFRNONAA >60 01/21/2021 1051   GFRAA >60 04/29/2020 0620   No results found for: CHOL, HDL, LDLCALC, LDLDIRECT, TRIG, CHOLHDL Lab Results  Component Value Date   HGBA1C 8.4 (H) 04/27/2020   No results found for: VITAMINB12 No results found for: TSH   01/21/21 CT head / cervical [I reviewed images myself and agree with interpretation. -VRP]  1. No acute brain injury. 2. No acute cervical spine injury. 3. 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 Mild degree of canal stenosis at the C3-4, C5-6 and C6-7 levels.    ASSESSMENT AND PLAN  71 y.o. year old male here with:   Dx:  1. Orthostatic lightheadedness   2. Near syncope   3. Syncope, unspecified syncope type     PLAN:  Syncope / presyncope / orthostatic lightheadedness (could be related to volume depletion, diabetic autonomic neuropathy, or cardiogenic  causes)  BP- Lying Pulse- Lying BP- Standing at 3 minutes Pulse- Standing at 3 minutes  11/07/21 1416 (!) 143/98 105 98/67 115   - significant orthostatic hypotension; with only mild compensatory tachycardia; likely diabetic autonomic neuropathy; recommend to stay hydrated and monitor BP at home, and follow up asap with PCP and cardiology; may need florinef / midodrine if sxs worsen - refer to cardiology (twin sister had aortic valve stenosis and syncope; they are concerned about similar diagnosis)  Orders Placed This Encounter  Procedures   Ambulatory referral to Cardiology   Return for pending if symptoms worsen or fail to improve, pending test results.   I spent 60 minutes of face-to-face and non-face-to-face time with patient.  This included previsit chart review, lab review, study review, order entry, electronic health record documentation, patient education.     Penni Bombard, MD 3/33/8329, 1:91 PM Certified in Neurology, Neurophysiology and Neuroimaging  [facility] [address] [place], [place] [address] [phone]

## 2021-12-04 ENCOUNTER — Other Ambulatory Visit: Payer: Self-pay

## 2021-12-04 ENCOUNTER — Ambulatory Visit: Payer: Medicare HMO | Admitting: Internal Medicine

## 2021-12-04 ENCOUNTER — Encounter: Payer: Self-pay | Admitting: Internal Medicine

## 2021-12-04 VITALS — BP 146/88 | HR 92 | Ht 77.0 in | Wt 196.0 lb

## 2021-12-04 DIAGNOSIS — E1169 Type 2 diabetes mellitus with other specified complication: Secondary | ICD-10-CM | POA: Diagnosis not present

## 2021-12-04 DIAGNOSIS — E785 Hyperlipidemia, unspecified: Secondary | ICD-10-CM

## 2021-12-04 DIAGNOSIS — R55 Syncope and collapse: Secondary | ICD-10-CM | POA: Diagnosis not present

## 2021-12-04 LAB — URINALYSIS, ROUTINE W REFLEX MICROSCOPIC
Bilirubin, UA: NEGATIVE
Ketones, UA: NEGATIVE
Leukocytes,UA: NEGATIVE
Nitrite, UA: NEGATIVE
Protein,UA: NEGATIVE
RBC, UA: NEGATIVE
Specific Gravity, UA: 1.02 (ref 1.005–1.030)
Urobilinogen, Ur: 0.2 mg/dL (ref 0.2–1.0)
pH, UA: 6 (ref 5.0–7.5)

## 2021-12-04 NOTE — Progress Notes (Signed)
? ?Cardiology Office Note ? ? ?Date:  12/04/2021  ? ?ID:  Micheal Sharper., DOB 20-Jun-1951, MRN [id number] ? ?PCP:  [name], MD  ?Cardiologist:   Dorris Carnes, MD  ? ?Patient referred for dizziness and syncope   ? ?  ?History of Present Illness: ?Micheal Varma. is a 71 y.o. male with a history of dizziness and one episode of syncope   Started about 1 year ago   Denies illness priort    ?He says he will have 1 to 2 spells per week  When not having spells he feels OK      ?He says on days dizzy he can get dizzy several times    ?Syncopal spell occurred when bent down to pick up a dog   Passes out     ? ?Denies numbness in limbs  Bowels moving OK   No nausea     Not cold/hot ? ?Says he drinks fluids     ? ? ? ? ? ?Current Meds  ?Medication Sig  ? aspirin 325 MG tablet Take 325 mg by mouth daily.   ? glimepiride (AMARYL) 2 MG tablet Take 2 mg by mouth daily.   ? HYDROcodone-acetaminophen (NORCO/VICODIN) 5-325 MG tablet Take 1-2 tablets by mouth every 6 (six) hours as needed for moderate pain or severe pain.  ? metFORMIN (GLUCOPHAGE) 500 MG tablet Take 2 tablets (1,000 mg total) by mouth 2 (two) times daily.  ? rosuvastatin (CRESTOR) 5 MG tablet Take 5 mg by mouth 2 (two) times a week. On Tuesday & Saturday  ? ? ? ?Allergies:   Patient has no known allergies.  ? ?Past Medical History:  ?Diagnosis Date  ? Cancer [facility])   ? right elbow melanoma  ? Diabetes mellitus without complication ([place])   ? Type II  ? Dizziness   ? DVT (deep venous thrombosis) ([place])   ? right leg  ? Frequent falls   ? Near syncope   ? Syncope   ? ? ?Past Surgical History:  ?Procedure Laterality Date  ? AMPUTATION TOE Left 04/28/2020  ? Procedure: AMPUTATION LEFT GREAT TOE;  Surgeon: Newt Minion, MD;  Location: [place];  Service: Orthopedics;  Laterality: Left;  ? MELANOMA EXCISION WITH SENTINEL LYMPH NODE BIOPSY Right 08/19/2020  ? Procedure: WIDE LOCAL EXCISION RIGHT ELBOW MELANOMA WITH RIGHT AXILLARY SENTINEL LYMPH NODE BIOPSY;   Surgeon: Dwan Bolt, MD;  Location: [place];  Service: General;  Laterality: Right;  2nd incision in axilla  ? ? ? ?Social History:  The patient  reports that he has been smoking cigarettes. He has a 3.80 pack-year smoking history. He has never used smokeless tobacco. He reports that he does not currently use drugs. He reports that he does not drink alcohol.  ? ?Family History:  The patient's family history includes Diabetes in his father and sister; Stroke in his mother and sister.  ? ? ?ROS:  Please see the history of present illness. All other systems are reviewed and  Negative to the above problem except as noted.  ? ? ?PHYSICAL EXAM: ?VS:  BP (!) 146/88   Pulse 92   Ht '6\' 5"'$  (1.956 m)   Wt 196 lb (88.9 kg)   SpO2 96%   BMI 23.24 kg/m?   ? ?BP 134/80  P 91   Sitting 146/88  P 92   Standing 122/64  )P 98   Standing 4 min  134/70  P 116   ?GEN: Well  nourished, well developed, in no acute distress  ?HEENT: normal  ?Neck: no JVD, carotid bruits, or masses ?Cardiac: RRR; no murmurs, rubs, or gallops,no edema  ?Respiratory:  clear to auscultation bilaterally, normal work of breathing ?GI: soft, nontender, nondistended, + BS  No hepatomegaly  ?MS: no deformity Moving all extremities   ?Skin: warm and dry, no rash ?Neuro:  Strength and sensation are intact ?Psych: euthymic mood, full affect ? ? ?EKG:  EKG is ordered today.  SR 92     ? ? ? ? ?Lipid Panel ?No results found for: CHOL, TRIG, HDL, CHOLHDL, VLDL, LDLCALC, LDLDIRECT ?  ? ?Wt Readings from Last 3 Encounters:  ?12/04/21 196 lb (88.9 kg)  ?11/07/21 195 lb 9.6 oz (88.7 kg)  ?01/21/21 215 lb (97.5 kg)  ?  ? ? ?ASSESSMENT AND PLAN: ? ?1  Dizzy/ syncope    Sounds orthostatic   Encouraged him to increase salt and water  Consider support sock ?Will check CBC, BMET , UA    ( for SG) ?Set up for echo     ? ?2 DM   On oral agents    ? ?F/U in June    ? ? ?Current medicines are reviewed at length with the patient today.  The patient does not have concerns  regarding medicines. ? ?Signed, ?Dorris Carnes, MD  ?12/04/2021 2:18 PM    ?[facility] ?[place], [place], [place]  [address] ?Phone: [phone]; Fax: [phone]  ? ? ?

## 2021-12-04 NOTE — Patient Instructions (Addendum)
Medication Instructions:  ?Your physician recommends that you continue on your current medications as directed. Please refer to the Current Medication list given to you today. ? ?*If you need a refill on your cardiac medications before your next appointment, please call your pharmacy* ? ? ?Lab Work: ?CBC, BMET, UA ?If you have labs (blood work) drawn today and your tests are completely normal, you will receive your results only by: ?MyChart Message (if you have MyChart) OR ?A paper copy in the mail ?If you have any lab test that is abnormal or we need to change your treatment, we will call you to review the results. ? ? ?Testing/Procedures: ?Your physician has requested that you have an echocardiogram. Echocardiography is a painless test that uses sound waves to create images of your heart. It provides your doctor with information about the size and shape of your heart and how well your heart?s chambers and valves are working. This procedure takes approximately one hour. There are no restrictions for this procedure. ? ? ? ?Follow-Up: ?At [facility], you and your health needs are our priority.  As part of our continuing mission to provide you with exceptional heart care, we have created designated Provider Care Teams.  These Care Teams include your primary Cardiologist (physician) and Advanced Practice Providers (APPs -  Physician Assistants and Nurse Practitioners) who all work together to provide you with the care you need, when you need it. ? ?We recommend signing up for the patient portal called "MyChart".  Sign up information is provided on this After Visit Summary.  MyChart is used to connect with patients for Virtual Visits (Telemedicine).  Patients are able to view lab/test results, encounter notes, upcoming appointments, etc.  Non-urgent messages can be sent to your provider as well.   ?To learn more about what you can do with MyChart, go to [email or web address].   ? ?Your next appointment:   ?3  month(s) ? ?The format for your next appointment:   ?In Person ? ?Provider:  DR. Nevin Bloodgood ROSS  ?

## 2021-12-05 LAB — CBC
Hematocrit: 37.5 % (ref 37.5–51.0)
Hemoglobin: 13.1 g/dL (ref 13.0–17.7)
MCH: 33 pg (ref 26.6–33.0)
MCHC: 34.9 g/dL (ref 31.5–35.7)
MCV: 95 fL (ref 79–97)
Platelets: 225 10*3/uL (ref 150–450)
RBC: 3.97 x10E6/uL — ABNORMAL LOW (ref 4.14–5.80)
RDW: 12.3 % (ref 11.6–15.4)
WBC: 8.8 10*3/uL (ref 3.4–10.8)

## 2021-12-05 LAB — BASIC METABOLIC PANEL
BUN/Creatinine Ratio: 17 (ref 10–24)
BUN: 17 mg/dL (ref 8–27)
CO2: 23 mmol/L (ref 20–29)
Calcium: 9.5 mg/dL (ref 8.6–10.2)
Chloride: 100 mmol/L (ref 96–106)
Creatinine, Ser: 1.02 mg/dL (ref 0.76–1.27)
Glucose: 213 mg/dL — ABNORMAL HIGH (ref 70–99)
Potassium: 5 mmol/L (ref 3.5–5.2)
Sodium: 137 mmol/L (ref 134–144)
eGFR: 79 mL/min/{1.73_m2} (ref >59)

## 2021-12-19 ENCOUNTER — Ambulatory Visit (HOSPITAL_COMMUNITY): Payer: Medicare HMO | Attending: Internal Medicine

## 2021-12-19 DIAGNOSIS — E785 Hyperlipidemia, unspecified: Secondary | ICD-10-CM | POA: Diagnosis not present

## 2021-12-19 DIAGNOSIS — R55 Syncope and collapse: Secondary | ICD-10-CM

## 2021-12-19 DIAGNOSIS — E1169 Type 2 diabetes mellitus with other specified complication: Secondary | ICD-10-CM | POA: Insufficient documentation

## 2021-12-19 LAB — ECHOCARDIOGRAM COMPLETE
Area-P 1/2: 5.42 cm2
S' Lateral: 3.7 cm
Single Plane A4C EF: 52.2 %

## 2022-02-04 IMAGING — PT NM PET IMAGE RESTAGE (PS) WHOLE BODY
1 of 13 series · 2 of 25 positions shown · non-contrast
Comparison: PET-CT 08/04/2020

CLINICAL DATA: Subsequent treatment strategy for malignant
melanoma. Wide local excision T4 lesion at the elbow. Immunotherapy
initiated.

EXAM:
NUCLEAR MEDICINE PET WHOLE BODY
TECHNIQUE: 10.7 mCi F-18 FDG was injected intravenously. Full-ring PET imaging
was performed from the head to foot after the radiotracer. CT data
was obtained and used for attenuation correction and anatomic
localization.
Fasting blood glucose: 164 mg/dl

[Series 4: ct wb 5.0 hd_fov · axial · 5.0mm · 1.27mm/px · z∈[+684,+1688]mm · 2 of 503 slices shown]
[im 252/503  soft-tissue]
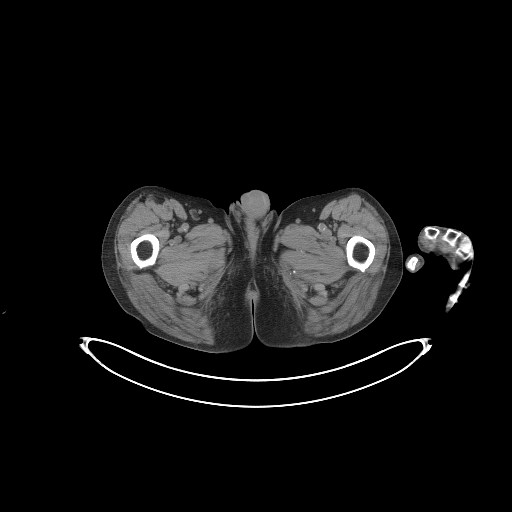
[im 503/503  soft-tissue]
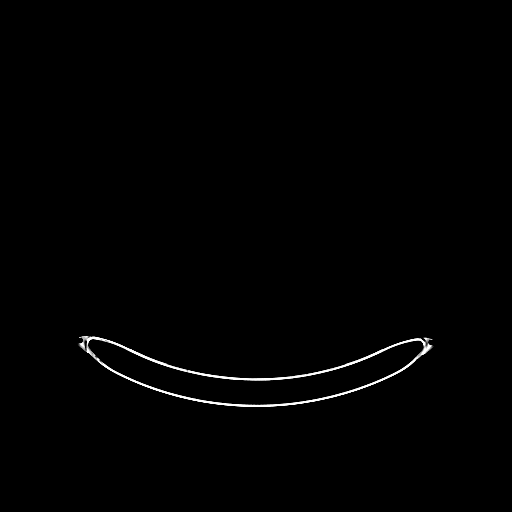

[2 of 25 positions shown; findings below may reference images not displayed]

FINDINGS: Mediastinal blood pool activity: SUV max

HEAD/NECK: No hypermetabolic activity in the scalp. No
hypermetabolic cervical lymph nodes.

Incidental CT findings: none

CHEST: No hypermetabolic mediastinal or hilar nodes. No suspicious
pulmonary nodules on the CT scan.

Incidental CT findings: Coronary artery calcification and aortic
atherosclerotic calcification.

ABDOMEN/PELVIS: No abnormal hypermetabolic activity within the
liver, pancreas, adrenal glands, or spleen. No hypermetabolic lymph
nodes in the abdomen or pelvis.

Incidental CT findings: none

SKELETON: No focal hypermetabolic activity to suggest skeletal
metastasis.

Incidental CT findings: none

EXTREMITIES: No residual activity associated with the RIGHT elbow
cutaneous lesion. Lymphadenectomy clips in the RIGHT axilla. No
associated radiotracer activity.

Incidental CT findings: none
IMPRESSION: 1. No evidence of local melanoma recurrence at the RIGHT elbow wide
local excision site.
2. No evidence of metastatic adenopathy in the RIGHT axilla.
3. No evidence of distant metastatic disease.
4. Coronary artery calcification and Aortic Atherosclerosis
([id number].H).

## 2022-02-22 ENCOUNTER — Inpatient Hospital Stay (HOSPITAL_COMMUNITY): Payer: Medicare HMO

## 2022-02-22 ENCOUNTER — Encounter (HOSPITAL_COMMUNITY): Payer: Self-pay

## 2022-02-22 ENCOUNTER — Other Ambulatory Visit: Payer: Self-pay

## 2022-02-22 ENCOUNTER — Emergency Department (HOSPITAL_COMMUNITY): Payer: Medicare HMO

## 2022-02-22 ENCOUNTER — Inpatient Hospital Stay (HOSPITAL_COMMUNITY)
Admission: EM | Admit: 2022-02-22 | Discharge: 2022-03-05 | DRG: 481 | Disposition: A | Discharge status: Skilled Nursing Facility | Payer: Medicare HMO | Attending: Internal Medicine | Admitting: Internal Medicine

## 2022-02-22 DIAGNOSIS — W010XXA Fall on same level from slipping, tripping and stumbling without subsequent striking against object, initial encounter: Secondary | ICD-10-CM | POA: Diagnosis present

## 2022-02-22 DIAGNOSIS — Z823 Family history of stroke: Secondary | ICD-10-CM

## 2022-02-22 DIAGNOSIS — I110 Hypertensive heart disease with heart failure: Secondary | ICD-10-CM | POA: Diagnosis present

## 2022-02-22 DIAGNOSIS — Z79899 Other long term (current) drug therapy: Secondary | ICD-10-CM

## 2022-02-22 DIAGNOSIS — Z86711 Personal history of pulmonary embolism: Secondary | ICD-10-CM | POA: Diagnosis not present

## 2022-02-22 DIAGNOSIS — D62 Acute posthemorrhagic anemia: Secondary | ICD-10-CM | POA: Diagnosis not present

## 2022-02-22 DIAGNOSIS — M2550 Pain in unspecified joint: Secondary | ICD-10-CM | POA: Diagnosis not present

## 2022-02-22 DIAGNOSIS — I951 Orthostatic hypotension: Secondary | ICD-10-CM | POA: Diagnosis not present

## 2022-02-22 DIAGNOSIS — F17210 Nicotine dependence, cigarettes, uncomplicated: Secondary | ICD-10-CM | POA: Diagnosis not present

## 2022-02-22 DIAGNOSIS — M542 Cervicalgia: Secondary | ICD-10-CM | POA: Diagnosis not present

## 2022-02-22 DIAGNOSIS — Z89422 Acquired absence of other left toe(s): Secondary | ICD-10-CM

## 2022-02-22 DIAGNOSIS — D649 Anemia, unspecified: Secondary | ICD-10-CM | POA: Diagnosis not present

## 2022-02-22 DIAGNOSIS — E1169 Type 2 diabetes mellitus with other specified complication: Secondary | ICD-10-CM | POA: Diagnosis not present

## 2022-02-22 DIAGNOSIS — S0990XA Unspecified injury of head, initial encounter: Secondary | ICD-10-CM | POA: Diagnosis not present

## 2022-02-22 DIAGNOSIS — Z01818 Encounter for other preprocedural examination: Secondary | ICD-10-CM | POA: Diagnosis not present

## 2022-02-22 DIAGNOSIS — S61412A Laceration without foreign body of left hand, initial encounter: Secondary | ICD-10-CM | POA: Diagnosis present

## 2022-02-22 DIAGNOSIS — Z86718 Personal history of other venous thrombosis and embolism: Secondary | ICD-10-CM

## 2022-02-22 DIAGNOSIS — S72001A Fracture of unspecified part of neck of right femur, initial encounter for closed fracture: Secondary | ICD-10-CM

## 2022-02-22 DIAGNOSIS — S72141A Displaced intertrochanteric fracture of right femur, initial encounter for closed fracture: Principal | ICD-10-CM | POA: Diagnosis present

## 2022-02-22 DIAGNOSIS — Z85820 Personal history of malignant melanoma of skin: Secondary | ICD-10-CM

## 2022-02-22 DIAGNOSIS — Z720 Tobacco use: Secondary | ICD-10-CM | POA: Diagnosis present

## 2022-02-22 DIAGNOSIS — I509 Heart failure, unspecified: Secondary | ICD-10-CM | POA: Diagnosis not present

## 2022-02-22 DIAGNOSIS — E1165 Type 2 diabetes mellitus with hyperglycemia: Secondary | ICD-10-CM | POA: Diagnosis present

## 2022-02-22 DIAGNOSIS — R296 Repeated falls: Secondary | ICD-10-CM | POA: Diagnosis not present

## 2022-02-22 DIAGNOSIS — Z1152 Encounter for screening for COVID-19: Secondary | ICD-10-CM

## 2022-02-22 DIAGNOSIS — M1711 Unilateral primary osteoarthritis, right knee: Secondary | ICD-10-CM | POA: Diagnosis not present

## 2022-02-22 DIAGNOSIS — Z7984 Long term (current) use of oral hypoglycemic drugs: Secondary | ICD-10-CM

## 2022-02-22 DIAGNOSIS — W19XXXA Unspecified fall, initial encounter: Secondary | ICD-10-CM

## 2022-02-22 DIAGNOSIS — I5032 Chronic diastolic (congestive) heart failure: Secondary | ICD-10-CM | POA: Diagnosis not present

## 2022-02-22 DIAGNOSIS — Y92480 Sidewalk as the place of occurrence of the external cause: Secondary | ICD-10-CM | POA: Diagnosis not present

## 2022-02-22 DIAGNOSIS — R000 Tachycardia, unspecified: Secondary | ICD-10-CM | POA: Diagnosis not present

## 2022-02-22 DIAGNOSIS — S72009A Fracture of unspecified part of neck of unspecified femur, initial encounter for closed fracture: Secondary | ICD-10-CM

## 2022-02-22 DIAGNOSIS — Z23 Encounter for immunization: Secondary | ICD-10-CM

## 2022-02-22 DIAGNOSIS — G8918 Other acute postprocedural pain: Secondary | ICD-10-CM | POA: Diagnosis not present

## 2022-02-22 DIAGNOSIS — E785 Hyperlipidemia, unspecified: Secondary | ICD-10-CM | POA: Diagnosis not present

## 2022-02-22 DIAGNOSIS — R9431 Abnormal electrocardiogram [ECG] [EKG]: Secondary | ICD-10-CM | POA: Diagnosis not present

## 2022-02-22 DIAGNOSIS — Z7982 Long term (current) use of aspirin: Secondary | ICD-10-CM | POA: Diagnosis not present

## 2022-02-22 DIAGNOSIS — Z833 Family history of diabetes mellitus: Secondary | ICD-10-CM | POA: Diagnosis not present

## 2022-02-22 DIAGNOSIS — Z4789 Encounter for other orthopedic aftercare: Secondary | ICD-10-CM | POA: Diagnosis not present

## 2022-02-22 DIAGNOSIS — M7989 Other specified soft tissue disorders: Secondary | ICD-10-CM | POA: Diagnosis not present

## 2022-02-22 DIAGNOSIS — Z7401 Bed confinement status: Secondary | ICD-10-CM | POA: Diagnosis not present

## 2022-02-22 DIAGNOSIS — E119 Type 2 diabetes mellitus without complications: Secondary | ICD-10-CM | POA: Diagnosis not present

## 2022-02-22 HISTORY — DX: Fracture of unspecified part of neck of unspecified femur, initial encounter for closed fracture: S72.009A

## 2022-02-22 LAB — CBC WITH DIFFERENTIAL/PLATELET
Abs Immature Granulocytes: 0.04 10*3/uL (ref 0.00–0.07)
Basophils Absolute: 0.1 10*3/uL (ref 0.0–0.1)
Basophils Relative: 1 %
Eosinophils Absolute: 0.2 10*3/uL (ref 0.0–0.5)
Eosinophils Relative: 2 %
HCT: 37 % — ABNORMAL LOW (ref 39.0–52.0)
Hemoglobin: 12.3 g/dL — ABNORMAL LOW (ref 13.0–17.0)
Immature Granulocytes: 0 %
Lymphocytes Relative: 20 %
Lymphs Abs: 1.9 10*3/uL (ref 0.7–4.0)
MCH: 31.9 pg (ref 26.0–34.0)
MCHC: 33.2 g/dL (ref 30.0–36.0)
MCV: 96.1 fL (ref 80.0–100.0)
Monocytes Absolute: 0.5 10*3/uL (ref 0.1–1.0)
Monocytes Relative: 5 %
Neutro Abs: 6.8 10*3/uL (ref 1.7–7.7)
Neutrophils Relative %: 72 %
Platelets: 226 10*3/uL (ref 150–400)
RBC: 3.85 MIL/uL — ABNORMAL LOW (ref 4.22–5.81)
RDW: 12.7 % (ref 11.5–15.5)
WBC: 9.5 10*3/uL (ref 4.0–10.5)
nRBC: 0 % (ref 0.0–0.2)

## 2022-02-22 LAB — ABO/RH: ABO/RH(D): A POS

## 2022-02-22 LAB — TYPE AND SCREEN
ABO/RH(D): A POS
Antibody Screen: NEGATIVE

## 2022-02-22 LAB — HEPATIC FUNCTION PANEL
ALT: 14 U/L (ref 0–44)
AST: 15 U/L (ref 15–41)
Albumin: 3.7 g/dL (ref 3.5–5.0)
Alkaline Phosphatase: 49 U/L (ref 38–126)
Bilirubin, Direct: 0.1 mg/dL (ref 0.0–0.2)
Indirect Bilirubin: 0.8 mg/dL (ref 0.3–0.9)
Total Bilirubin: 0.9 mg/dL (ref 0.3–1.2)
Total Protein: 6.4 g/dL — ABNORMAL LOW (ref 6.5–8.1)

## 2022-02-22 LAB — HEMOGLOBIN A1C
Hgb A1c MFr Bld: 7.2 % — ABNORMAL HIGH (ref 4.8–5.6)
Mean Plasma Glucose: 159.94 mg/dL

## 2022-02-22 LAB — CK: Total CK: 201 U/L (ref 49–397)

## 2022-02-22 LAB — BASIC METABOLIC PANEL
Anion gap: 6 (ref 5–15)
BUN: 22 mg/dL (ref 8–23)
CO2: 23 mmol/L (ref 22–32)
Calcium: 8.9 mg/dL (ref 8.9–10.3)
Chloride: 109 mmol/L (ref 98–111)
Creatinine, Ser: 1.04 mg/dL (ref 0.61–1.24)
GFR, Estimated: >60 mL/min (ref >60)
Glucose, Bld: 152 mg/dL — ABNORMAL HIGH (ref 70–99)
Potassium: 4.9 mmol/L (ref 3.5–5.1)
Sodium: 138 mmol/L (ref 135–145)

## 2022-02-22 LAB — MAGNESIUM: Magnesium: 2 mg/dL (ref 1.7–2.4)

## 2022-02-22 LAB — PHOSPHORUS: Phosphorus: 3.5 mg/dL (ref 2.5–4.6)

## 2022-02-22 LAB — TSH: TSH: 2.183 u[IU]/mL (ref 0.350–4.500)

## 2022-02-22 MED ORDER — MUPIROCIN 2 % EX OINT: 1.0000 "application " | TOPICAL_OINTMENT | Freq: Two times a day (BID) | CUTANEOUS | Status: DC

## 2022-02-22 MED ORDER — NICOTINE 14 MG/24HR TD PT24
14.0000 mg | MEDICATED_PATCH | Freq: Every day | TRANSDERMAL | Status: DC
  Filled 2022-02-22 (×6): qty 1

## 2022-02-22 MED ORDER — MORPHINE SULFATE (PF) 2 MG/ML IV SOLN
2.0000 mg | INTRAVENOUS | Status: DC | PRN
  Administered 2022-02-22: 2 mg via INTRAVENOUS
  Filled 2022-02-22: qty 1

## 2022-02-22 MED ORDER — TETANUS-DIPHTH-ACELL PERTUSSIS 5-2.5-18.5 LF-MCG/0.5 IM SUSY
0.5000 mL | PREFILLED_SYRINGE | Freq: Once | INTRAMUSCULAR | Status: AC
  Administered 2022-02-22: 0.5 mL via INTRAMUSCULAR
  Filled 2022-02-22: qty 0.5

## 2022-02-22 MED ORDER — METHOCARBAMOL 1000 MG/10ML IJ SOLN: 500.0000 mg | Freq: Four times a day (QID) | INTRAVENOUS | Status: DC | PRN

## 2022-02-22 MED ORDER — INSULIN ASPART 100 UNIT/ML IJ SOLN
0.0000 [IU] | INTRAMUSCULAR | Status: DC
  Administered 2022-02-23 (×2): 2 [IU] via SUBCUTANEOUS
  Administered 2022-02-23: 7 [IU] via SUBCUTANEOUS
  Administered 2022-02-23: 2 [IU] via SUBCUTANEOUS
  Administered 2022-02-23: 9 [IU] via SUBCUTANEOUS
  Administered 2022-02-24: 2 [IU] via SUBCUTANEOUS
  Administered 2022-02-24: 7 [IU] via SUBCUTANEOUS
  Administered 2022-02-24 (×4): 2 [IU] via SUBCUTANEOUS
  Administered 2022-02-25: 3 [IU] via SUBCUTANEOUS
  Administered 2022-02-25: 2 [IU] via SUBCUTANEOUS
  Administered 2022-02-25 (×2): 3 [IU] via SUBCUTANEOUS
  Administered 2022-02-25: 5 [IU] via SUBCUTANEOUS
  Administered 2022-02-25: 2 [IU] via SUBCUTANEOUS
  Administered 2022-02-25: 1 [IU] via SUBCUTANEOUS
  Administered 2022-02-26: 3 [IU] via SUBCUTANEOUS
  Administered 2022-02-26: 5 [IU] via SUBCUTANEOUS
  Administered 2022-02-26: 3 [IU] via SUBCUTANEOUS
  Administered 2022-02-26: 1 [IU] via SUBCUTANEOUS
  Administered 2022-02-26: 5 [IU] via SUBCUTANEOUS
  Administered 2022-02-27: 1 [IU] via SUBCUTANEOUS
  Administered 2022-02-27 (×2): 2 [IU] via SUBCUTANEOUS
  Administered 2022-02-27: 1 [IU] via SUBCUTANEOUS
  Administered 2022-02-27 – 2022-02-28 (×2): 2 [IU] via SUBCUTANEOUS
  Administered 2022-02-28: 3 [IU] via SUBCUTANEOUS
  Filled 2022-02-22: qty 0.09

## 2022-02-22 MED ORDER — HYDROCODONE-ACETAMINOPHEN 5-325 MG PO TABS
1.0000 | ORAL_TABLET | Freq: Four times a day (QID) | ORAL | Status: DC | PRN
  Administered 2022-02-24: 1 via ORAL
  Administered 2022-02-24: 2 via ORAL
  Administered 2022-02-25 – 2022-02-26 (×2): 1 via ORAL
  Administered 2022-02-28 – 2022-03-05 (×7): 2 via ORAL
  Filled 2022-02-22 (×10): qty 2
  Filled 2022-02-22 (×2): qty 1

## 2022-02-22 MED ORDER — METHOCARBAMOL 500 MG PO TABS
500.0000 mg | ORAL_TABLET | Freq: Four times a day (QID) | ORAL | Status: DC | PRN
  Administered 2022-02-23 – 2022-03-02 (×7): 500 mg via ORAL
  Filled 2022-02-22 (×7): qty 1

## 2022-02-22 NOTE — Assessment & Plan Note (Signed)
Order sliding scale Hold p.o. medications

## 2022-02-22 NOTE — Plan of Care (Signed)
Plan of care discussed.   

## 2022-02-22 NOTE — Assessment & Plan Note (Signed)
Removed we will hold aspirin for now

## 2022-02-22 NOTE — H&P (View-Only) (Signed)
History: Patient presents after mechanical fall with pain in his right hip.He notes no history of repair in this area.  He was in his usual state of health, when he tripped, falling on his right hip.  Since that time he has been unable to walk secondary to pain.  Patient notes that he has some degree of neck pain generally, possibly worse today.  No new weakness in any extremity, no loss of sensation in the distal right lower extremity.  Past Medical History:  Diagnosis Date   Cancer ([place])    right elbow melanoma   Diabetes mellitus without complication (HCC)    Type II   Dizziness    DVT (deep venous thrombosis) ([facility])    right leg   Frequent falls    Near syncope    Syncope     No Known Allergies  No current facility-administered medications on file prior to encounter.   Current Outpatient Medications on File Prior to Encounter  Medication Sig Dispense Refill   aspirin 325 MG tablet Take 325 mg by mouth daily.      glimepiride (AMARYL) 2 MG tablet Take 2 mg by mouth daily.      HYDROcodone-acetaminophen (NORCO/VICODIN) 5-325 MG tablet Take 1-2 tablets by mouth every 6 (six) hours as needed for moderate pain or severe pain. 20 tablet 0   metFORMIN (GLUCOPHAGE) 500 MG tablet Take 2 tablets (1,000 mg total) by mouth 2 (two) times daily. 120 tablet 0   rosuvastatin (CRESTOR) 5 MG tablet Take 5 mg by mouth 2 (two) times a week. On Tuesday & Saturday      Physical Exam: Vitals:   02/22/22 1800 02/22/22 1815  BP: 115/86 130/78  Pulse: 93 91  Resp: 10 14  Temp:    SpO2: 100% 97%   Body mass index is 24.9 kg/m. He is alert and oriented x3. No shortness of breath or chest pain. Abdomen soft and nontender.  No rebound tenderness. Significant right anterior groin pain.  Unable to passively range the right hip due to pain.  No laceration or abrasion is noted. 5/5 EHL/tibialis anterior/gastrocnemius strength bilaterally.  Sensation light touch is intact throughout the lower  extremity. Compartments soft and nontender.  2+ dorsalis pedis/posterior tibialis pulses.  Image: DG Hip Unilat W or Wo Pelvis 2-3 Views Right  Result Date: 02/22/2022 CLINICAL DATA:  Fall.  Right leg short and rotated. EXAM: DG HIP (WITH OR WITHOUT PELVIS) 2-3V RIGHT COMPARISON:  None Available. FINDINGS: There is diffuse decreased bone mineralization. There is a comminuted fracture of the right proximal femoral intertrochanteric region with mild-to-moderate diastasis and mild varus angulation. Mild bilateral femoroacetabular joint space narrowing. The pubic symphysis and right sacroiliac joint spaces are maintained. IMPRESSION: Mildly displaced and angulated, comminuted right intertrochanteric acute fracture. Electronically Signed   By: Yvonne Kendall M.D.   On: 02/22/2022 17:26   CT CERVICAL SPINE WO CONTRAST  Result Date: 02/22/2022 CLINICAL DATA:  Chronic neck pain, worse after a fall today. EXAM: CT CERVICAL SPINE WITHOUT CONTRAST TECHNIQUE: Multidetector CT imaging of the cervical spine was performed without intravenous contrast. Multiplanar CT image reconstructions were also generated. RADIATION DOSE REDUCTION: This exam was performed according to the departmental dose-optimization program which includes automated exposure control, adjustment of the mA and/or kV according to patient size and/or use of iterative reconstruction technique. COMPARISON:  01/21/2021 FINDINGS: Alignment: Imaging through the bottom of T1. Maintenance of vertebral body height. Straightening of expected lordosis. Skull base and vertebrae:  Skull base intact. Coronal reformats demonstrate a normal C1-C2 articulation. Facet arthropathy is most significant on the right at C4-5 and on the left at C2-3. Soft tissues and spinal canal: Bilateral carotid atherosclerosis. Disc levels: Loss of intervertebral disc height is most significant at C6-7. C2-3 central canal and neural foraminal narrowing secondary to uncovertebral joint  hypertrophy and disc osteophyte complex. Similar findings at C3-4, including eccentric right disc osteophyte complex. At C4-5, significant right-sided neural foraminal narrowing secondary to uncovertebral joint hypertrophy. At C5-6, central canal and neural foraminal narrowing secondary to above factors. Similar findings at C6-7 and less so C7-T1. Upper chest: No apical pneumothorax. Other: None. IMPRESSION: No acute fracture or subluxation within the cervical spine. Significant cervical spondylosis, with multilevel central canal and neural foraminal narrowing as detailed above. Nonspecific straightening of expected cervical lordosis. Electronically Signed   By: Abigail Miyamoto M.D.   On: 02/22/2022 17:73    A/P: 71 year old gentleman unfortunately fell and has significant right hip pain.  Imaging studies confirmed an intertrochanteric hip fracture.  As result orthopedic consultation was requested.  I reviewed the case with my partner Dr. Lyla Glassing we will plan on surgical treatment tomorrow.  Patient will be admitted to the medical team and we will plan on a right ORIF of the hip.

## 2022-02-22 NOTE — Progress Notes (Signed)
Asked by Dr. Rolena Infante to take patient for surgical treatment tomorrow. Plan for IM nail R femur tomorrow am. NPO after MN. Hold chemical DVT ppx. Preop orders placed.

## 2022-02-22 NOTE — ED Provider Notes (Signed)
[facility] DEPT Provider Note   CSN: [id number] Arrival date & time: 02/22/22  6546     History  No chief complaint on file.   Micheal Moore. is a 71 y.o. male.  HPI Patient presents after mechanical fall with pain in his right hip. He notes no history of repair in this area.  He was in his usual state of health, when he tripped, falling on his right hip.  Since that time he has been unable to walk secondary to pain.  Patient notes that he has some degree of neck pain generally, possibly worse today.  No new weakness in any extremity, no loss of sensation in the distal right lower extremity.    Home Medications Prior to Admission medications   Medication Sig Start Date End Date Taking? Authorizing Provider  aspirin 325 MG tablet Take 325 mg by mouth daily.     [provider]  glimepiride (AMARYL) 2 MG tablet Take 2 mg by mouth daily.  01/26/19   [provider]  HYDROcodone-acetaminophen (NORCO/VICODIN) 5-325 MG tablet Take 1-2 tablets by mouth every 6 (six) hours as needed for moderate pain or severe pain. 01/21/21   Charlesetta Shanks, MD  metFORMIN (GLUCOPHAGE) 500 MG tablet Take 2 tablets (1,000 mg total) by mouth 2 (two) times daily. 04/29/20   Geradine Girt, DO  rosuvastatin (CRESTOR) 5 MG tablet Take 5 mg by mouth 2 (two) times a week. On Tuesday & Saturday 03/26/20   [provider]      Allergies    Patient has no known allergies.    Review of Systems   Review of Systems  All other systems reviewed and are negative.   Physical Exam Updated Vital Signs BP 136/83   Pulse 91   Temp 98.1 F (36.7 C) (Oral)   Resp 11   Ht '6\' 5"'$  (1.956 m)   Wt 95.3 kg   SpO2 99%   BMI 24.90 kg/m  Physical Exam Vitals and nursing note reviewed.  Constitutional:      General: He is not in acute distress.    Appearance: He is well-developed.  HENT:     Head: Normocephalic and atraumatic.  Eyes:      Conjunctiva/sclera: Conjunctivae normal.  Cardiovascular:     Rate and Rhythm: Normal rate and regular rhythm.  Pulmonary:     Effort: Pulmonary effort is normal. No respiratory distress.     Breath sounds: No stridor.  Abdominal:     General: There is no distension.  Musculoskeletal:     Cervical back: Decreased range of motion.       Legs:  Skin:    General: Skin is warm and dry.     Comments: 1 cm wound partial dermal depth left palm hemostatic  Neurological:     Mental Status: He is alert and oriented to person, place, and time.     ED Results / Procedures / Treatments   Labs (all labs ordered are listed, but only abnormal results are displayed) Labs Reviewed  BASIC METABOLIC PANEL - Abnormal; Notable for the following components:      Result Value   Glucose, Bld 152 (*)    All other components within normal limits  CBC WITH DIFFERENTIAL/PLATELET - Abnormal; Notable for the following components:   RBC 3.85 (*)    Hemoglobin 12.3 (*)    HCT 37.0 (*)    All other components within normal limits  TYPE AND SCREEN  EKG None  Radiology DG Hip Unilat W or Wo Pelvis 2-3 Views Right  Result Date: 02/22/2022 CLINICAL DATA:  Fall.  Right leg short and rotated. EXAM: DG HIP (WITH OR WITHOUT PELVIS) 2-3V RIGHT COMPARISON:  None Available. FINDINGS: There is diffuse decreased bone mineralization. There is a comminuted fracture of the right proximal femoral intertrochanteric region with mild-to-moderate diastasis and mild varus angulation. Mild bilateral femoroacetabular joint space narrowing. The pubic symphysis and right sacroiliac joint spaces are maintained. IMPRESSION: Mildly displaced and angulated, comminuted right intertrochanteric acute fracture. Electronically Signed   By: Yvonne Kendall M.D.   On: 02/22/2022 17:26   CT CERVICAL SPINE WO CONTRAST  Result Date: 02/22/2022 CLINICAL DATA:  Chronic neck pain, worse after a fall today. EXAM: CT CERVICAL SPINE WITHOUT CONTRAST  TECHNIQUE: Multidetector CT imaging of the cervical spine was performed without intravenous contrast. Multiplanar CT image reconstructions were also generated. RADIATION DOSE REDUCTION: This exam was performed according to the departmental dose-optimization program which includes automated exposure control, adjustment of the mA and/or kV according to patient size and/or use of iterative reconstruction technique. COMPARISON:  01/21/2021 FINDINGS: Alignment: Imaging through the bottom of T1. Maintenance of vertebral body height. Straightening of expected lordosis. Skull base and vertebrae: Skull base intact. Coronal reformats demonstrate a normal C1-C2 articulation. Facet arthropathy is most significant on the right at C4-5 and on the left at C2-3. Soft tissues and spinal canal: Bilateral carotid atherosclerosis. Disc levels: Loss of intervertebral disc height is most significant at C6-7. C2-3 central canal and neural foraminal narrowing secondary to uncovertebral joint hypertrophy and disc osteophyte complex. Similar findings at C3-4, including eccentric right disc osteophyte complex. At C4-5, significant right-sided neural foraminal narrowing secondary to uncovertebral joint hypertrophy. At C5-6, central canal and neural foraminal narrowing secondary to above factors. Similar findings at C6-7 and less so C7-T1. Upper chest: No apical pneumothorax. Other: None. IMPRESSION: No acute fracture or subluxation within the cervical spine. Significant cervical spondylosis, with multilevel central canal and neural foraminal narrowing as detailed above. Nonspecific straightening of expected cervical lordosis. Electronically Signed   By: Abigail Miyamoto M.D.   On: 02/22/2022 17:09    Procedures Procedures    Medications Ordered in ED Medications  Tdap (BOOSTRIX) injection 0.5 mL (0.5 mLs Intramuscular Given 02/22/22 1643)    ED Course/ Medical Decision Making/ A&P This patient with a Hx of type 2 diabetes, tobacco abuse  ongoing episodes of dizziness as well as prior episode melanoma presents to the ED for concern of pain in his hip following a fall, as well as neck pain, this involves an extensive number of treatment options, and is a complaint that carries with it a high risk of complications and morbidity.    The differential diagnosis includes fracture   Social Determinants of Health:  Age tobacco abuse  Additional history obtained:  Additional history and/or information obtained from chart review, EMS, notable for unremarkable vitals in transport   After the initial evaluation, orders, including: Labs x-ray CT were initiated.   Patient placed on Cardiac and Pulse-Oximetry Monitors. The patient was maintained on a cardiac monitor.  The cardiac monitored showed an rhythm of 95 sinus normal The patient was also maintained on pulse oximetry. The readings were typically 100% room air normal   On repeat evaluation of the patient stayed the same  Lab Tests:  I personally interpreted labs.  The pertinent results include: Mild hyperglycemia, trivial anemia  Imaging Studies ordered:  I independently visualized and interpreted  imaging which showed right intertrochanteric fracture, cervical spine unremarkable I agree with the radiologist interpretation  Consultations Obtained:  I requested consultation with the orthopedics (Dr. Rolena Infante),  and discussed lab and imaging findings as well as pertinent plan - they recommend: Admission for surgical repair  Dispostion / Final MDM:  After consideration of the diagnostic results and the patient's response to treatment, after discussing all results with the patient he is awake, alert, in no distress, aware of all findings.  After presenting with right hip pain following a fall, patient is found to have right hip fracture requiring admission for surgical repair.  No evidence for concurrent other phenomena, such as infection, cervical spine disruption.  Final  Clinical Impression(s) / ED Diagnoses Final diagnoses:  Fall, initial encounter  Closed fracture of right hip, initial encounter [facility])     Carmin Muskrat, MD 02/22/22 [phone]

## 2022-02-22 NOTE — Assessment & Plan Note (Addendum)
-   management as per orthopedics,  plan to operate  in  a.m.   Keep nothing by mouth post midnight. Patient  not on anticoagulation .  antiplatelet agents   on hold Ordered type and screen,   order a vitamin D level  Patient at baseline able to walk a flight of stairs or 100 feet      Patient denies any chest pain or shortness of breath currently and/or with exertion,   ECG showing no evidence of acute ischemia  no known history of coronary artery disease COPD  Liver failure  CKD  Given advanced age patient is at least moderate  risk  which has been discussed with patient  but at this point no furthther cardiac workup is indicated.

## 2022-02-22 NOTE — Subjective & Objective (Signed)
Comes in the mechanical fall on concrete walkway on top of right hip no LOC patient denies being on any blood thinners noted to have significant right-sided pain they have right leg shortened and rotated.  EMS was called given 100 micrograms of fentanyl On arrival also noted to have laceration of the left palm. Patient denied hitting his head but does endorse some neck pain which is chronic He is on aspirin 325 a day

## 2022-02-22 NOTE — Assessment & Plan Note (Signed)
Chronic continue Crestor

## 2022-02-22 NOTE — H&P (Signed)
[place] OMV:[id number] DOB: 10-May-1951 DOA: 02/22/2022     PCP: [name], MD   Outpatient Specialists:  NONE    Patient arrived to ER on 02/22/22 at 1602 Referred by Attending Toy Baker, MD   Patient coming from:    home Lives  With family    Chief Complaint:  fall right hip pain   HPI: Micheal Moore. is a 71 y.o. male with medical history significant of DM2, DVT on aspirin, falls,  melanoma, HLD    Presented with   fall Comes in the mechanical fall on concrete walkway on top of right hip no LOC patient denies being on any blood thinners noted to have significant right-sided pain they have right leg shortened and rotated.  EMS was called given 100 micrograms of fentanyl On arrival also noted to have laceration of the left palm. 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  Following Medications were ordered in ER: Medications  insulin aspart (novoLOG) injection 0-9 Units (has no administration in time range)  Tdap (BOOSTRIX) injection 0.5 mL (0.5 mLs Intramuscular Given 02/22/22 1643)     _______________________________________________________ ER Provider Called:   Orthopedics Dr.Brooks   They Recommend admit to medicine    SEEN in ER   ED Triage Vitals  Enc Vitals Group     BP 02/22/22 1621 (!) 152/90     Pulse Rate 02/22/22 1621 93     Resp 02/22/22 1621 16     Temp 02/22/22 1621 98.1 F (36.7 C)     Temp Source 02/22/22 1621 Oral     SpO2 02/22/22 1645 99 %     Weight 02/22/22 1614 210 lb (95.3 kg)     Height 02/22/22 1614 '6\' 5"'$  (1.956 m)     Head Circumference --      Peak Flow --      Pain Score 02/22/22 1611 0     Pain Loc --      Pain Edu? --      Excl. in [place]? --   TMAX(24)@     _________________________________________ Significant initial  Findings: Abnormal Labs Reviewed  BASIC METABOLIC PANEL - Abnormal; Notable for the following components:      Result Value   Glucose, Bld 152 (*)    All other components within normal limits  CBC WITH DIFFERENTIAL/PLATELET - Abnormal; Notable for the following components:   RBC 3.85 (*)    Hemoglobin 12.3 (*)    HCT 37.0 (*)    All other components within normal limits      ed ECG: Ordered Personally reviewed by me showing: HR : 95 Rhythm: NSR  Probable left atrial enlargement RSR' in V1 or V2, probably normal variant Probable left ventricular hypertrophy QTC  425    The recent clinical data is shown below. Vitals:   02/22/22 1800 02/22/22 1815 02/22/22 1830 02/22/22 1845  BP: 115/86 130/78 (!) 148/88 127/77  Pulse: 93 91 92 97  Resp: '10 14 11 19  '$ Temp:      TempSrc:      SpO2: 100% 97% 99% 100%  Weight:      Height:           WBC     Component Value Date/Time   WBC 9.5 02/22/2022 1649   LYMPHSABS 1.9 02/22/2022 1649   MONOABS 0.5 02/22/2022 1649   EOSABS 0.2 02/22/2022 1649   BASOSABS 0.1 02/22/2022 1649      Results for orders placed or performed during the hospital encounter of 08/16/20  SARS CORONAVIRUS 2 (TAT 6-24 HRS) Nasopharyngeal Nasopharyngeal Swab     Status: None    Collection Time: 08/16/20 12:33 PM   Specimen: Nasopharyngeal Swab  Result Value Ref Range Status   SARS Coronavirus 2 NEGATIVE NEGATIVE Final          _______________________________________________ Hospitalist was called for admission for   Fall, initial encounter    Closed fracture of right hip, initial encounter ([place])     The following Work up has been ordered so far:  Orders Placed This Encounter  Procedures   DG Hip Unilat W or Wo Pelvis 2-3 Views Right   CT CERVICAL SPINE WO CONTRAST   DG Chest 2 View   Basic metabolic panel   CBC with Differential   CK   Hepatic function panel   Magnesium   Phosphorus   Prealbumin   TSH   Hemoglobin A1c   Diet NPO time specified   Check CMS   Bed rest   Initiate Carrier Fluid Protocol   Apply Diabetes Mellitus Care Plan   STAT CBG when hypoglycemia is suspected. If treated, recheck every 15 minutes after each treatment until CBG >/= 70 mg/dl   Refer to Hypoglycemia Protocol Sidebar Report for treatment of CBG < 70 mg/dl   No basal insulin at this time   Consult to orthopedic surgery   Consult to hospitalist   Consult to Registered Dietitian   Consult to anesthesiology for nerve block Laterality: Right; Nerve block will be performed in PACU.   ED EKG   Type and screen [place] to Inpatient (patient's expected length of stay will be greater than 2 midnights or inpatient only procedure)     OTHER Significant initial  Findings:  labs showing:    Recent Labs  Lab 02/22/22 1649  NA 138  K 4.9  CO2 23  GLUCOSE 152*  BUN 22  CREATININE 1.04  CALCIUM 8.9    Cr   stable,   Lab Results  Component Value Date   CREATININE 1.04 02/22/2022   CREATININE 1.02 12/04/2021   CREATININE 0.99 01/21/2021    Recent Labs  Lab 02/22/22 2200  AST 15  ALT 14  ALKPHOS 49  BILITOT 0.9  PROT 6.4*  ALBUMIN 3.7  Lab Results  Component Value Date   CALCIUM 8.9 02/22/2022           Plt: Lab Results  Component Value Date   PLT 226 02/22/2022        Recent Labs  Lab 02/22/22 1649  WBC 9.5  NEUTROABS 6.8  HGB 12.3*  HCT 37.0*  MCV 96.1  PLT 226    HG/HCT   stable,      Component Value Date/Time   HGB 12.3 (L) 02/22/2022 1649   HGB 13.1 12/04/2021 1429   HCT 37.0 (L) 02/22/2022 1649   HCT 37.5 12/04/2021 1429   MCV 96.1 02/22/2022 1649   MCV 95 12/04/2021 1429    Cardiac Panel (last 3 results) Recent Labs    02/22/22 2200  CKTOTAL 201     Cultures:    Component Value Date/Time   SDES BLOOD SITE NOT SPECIFIED 04/27/2020 0904   SPECREQUEST  04/27/2020 0904    BOTTLES DRAWN AEROBIC AND ANAEROBIC Blood Culture results may not be optimal due to an excessive volume of blood received in culture bottles   CULT  04/27/2020 0904    NO GROWTH 5 DAYS Performed at [facility], [place] [address], [place], [place] [address]    REPTSTATUS 05/02/2020 FINAL 04/27/2020 7858     Radiological Exams on Admission: DG Chest 2 View  Result Date: 02/22/2022 CLINICAL DATA:  Known right hip fracture, preoperative evaluation EXAM: CHEST - 2 VIEW COMPARISON:  01/21/2021 FINDINGS: Cardiac shadow is within normal limits. The lungs are well aerated bilaterally. No focal infiltrate or effusion is seen. No acute bony abnormality is noted. IMPRESSION: No acute abnormality noted. Electronically Signed   By: Inez Catalina M.D.   On: 02/22/2022 19:02   DG Hip Unilat W or Wo Pelvis 2-3 Views Right  Result Date: 02/22/2022 CLINICAL DATA:  Fall.  Right leg short and rotated. EXAM: DG HIP (WITH OR WITHOUT PELVIS) 2-3V RIGHT COMPARISON:  None Available. FINDINGS: There is diffuse decreased bone mineralization. There is a comminuted fracture of the right proximal femoral intertrochanteric region with mild-to-moderate diastasis and mild varus angulation. Mild bilateral femoroacetabular joint space narrowing. The pubic symphysis and right sacroiliac joint spaces are maintained.  IMPRESSION: Mildly displaced and angulated, comminuted right intertrochanteric acute fracture. Electronically Signed   By: Yvonne Kendall M.D.   On: 02/22/2022 17:26   CT CERVICAL SPINE WO CONTRAST  Result Date: 02/22/2022 CLINICAL DATA:  Chronic neck pain, worse after a fall today. EXAM: CT CERVICAL SPINE WITHOUT CONTRAST TECHNIQUE: Multidetector CT imaging of the cervical spine was performed without intravenous contrast. Multiplanar CT image reconstructions were also generated. RADIATION DOSE REDUCTION: This exam was performed according to the departmental dose-optimization program which includes automated exposure control, adjustment of the mA and/or kV according to patient size and/or use of iterative reconstruction technique. COMPARISON:  01/21/2021 FINDINGS: Alignment: Imaging through the bottom of T1. Maintenance of vertebral body height. Straightening of expected lordosis. Skull base and vertebrae: Skull base intact. Coronal reformats demonstrate a normal C1-C2 articulation. Facet arthropathy is most significant on the right at C4-5 and on the left at C2-3. Soft tissues and spinal canal: Bilateral carotid atherosclerosis. Disc levels: Loss of intervertebral disc height is most significant at C6-7. C2-3 central canal and neural foraminal narrowing secondary to uncovertebral joint hypertrophy and disc osteophyte complex. Similar findings at C3-4, including eccentric right disc osteophyte complex. At C4-5, significant right-sided neural foraminal narrowing secondary to uncovertebral joint hypertrophy. At C5-6, central canal and  neural foraminal narrowing secondary to above factors. Similar findings at C6-7 and less so C7-T1. Upper chest: No apical pneumothorax. Other: None. IMPRESSION: No acute fracture or subluxation within the cervical spine. Significant cervical spondylosis, with multilevel central canal and neural foraminal narrowing as detailed above. Nonspecific straightening of expected cervical  lordosis. Electronically Signed   By: Abigail Miyamoto M.D.   On: 02/22/2022 17:09   _______________________________________________________________________________________________________ Latest  Blood pressure 127/77, pulse 97, temperature 98.1 F (36.7 C), temperature source Oral, resp. rate 19, height '6\' 5"'$  (1.956 m), weight 95.3 kg, SpO2 100 %.   Vitals  labs and radiology finding personally reviewed  Review of Systems:    Pertinent positives include: fall, right hip pain   Constitutional:  No weight loss, night sweats, Fevers, chills, fatigue, weight loss  HEENT:  No headaches, Difficulty swallowing,Tooth/dental problems,Sore throat,  No sneezing, itching, ear ache, nasal congestion, post nasal drip,  Cardio-vascular:  No chest pain, Orthopnea, PND, anasarca, dizziness, palpitations.no Bilateral lower extremity swelling  GI:  No heartburn, indigestion, abdominal pain, nausea, vomiting, diarrhea, change in bowel habits, loss of appetite, melena, blood in stool, hematemesis Resp:  no shortness of breath at rest. No dyspnea on exertion, No excess mucus, no productive cough, No non-productive cough, No coughing up of blood.No change in color of mucus.No wheezing. Skin:  no rash or lesions. No jaundice GU:  no dysuria, change in color of urine, no urgency or frequency. No straining to urinate.  No flank pain.  Musculoskeletal:  No joint pain or no joint swelling. No decreased range of motion. No back pain.  Psych:  No change in mood or affect. No depression or anxiety. No memory loss.  Neuro: no localizing neurological complaints, no tingling, no weakness, no double vision, no gait abnormality, no slurred speech, no confusion  All systems reviewed and apart from [place] all are negative _______________________________________________________________________________________________ Past Medical History:   Past Medical History:  Diagnosis Date   Cancer ([place])    right elbow melanoma    Diabetes mellitus without complication ([place])    Type II   Dizziness    DVT (deep venous thrombosis) ([facility])    right leg   Frequent falls    Near syncope    Syncope       Past Surgical History:  Procedure Laterality Date   AMPUTATION TOE Left 04/28/2020   Procedure: AMPUTATION LEFT GREAT TOE;  Surgeon: Newt Minion, MD;  Location: [facility];  Service: Orthopedics;  Laterality: Left;   MELANOMA EXCISION WITH SENTINEL LYMPH NODE BIOPSY Right 08/19/2020   Procedure: WIDE LOCAL EXCISION RIGHT ELBOW MELANOMA WITH RIGHT AXILLARY SENTINEL LYMPH NODE BIOPSY;  Surgeon: Dwan Bolt, MD;  Location: [facility];  Service: General;  Laterality: Right;  2nd incision in axilla    Social History:  Ambulatory   independently       reports that he has been smoking cigarettes. He has a 3.80 pack-year smoking history. He has never used smokeless tobacco. He reports that he does not currently use drugs. He reports that he does not drink alcohol.     Family History:   Family History  Problem Relation Age of Onset   Stroke Mother    Diabetes Father    Stroke Sister    Diabetes Sister    ______________________________________________________________________________________________ Allergies: No Known Allergies   Prior to Admission medications   Medication Sig Start Date End Date Taking? Authorizing Provider  aspirin 325 MG tablet Take 325 mg by mouth daily.  Yes [provider]  glimepiride (AMARYL) 2 MG tablet Take 2 mg by mouth daily.  01/26/19  Yes [provider]  metFORMIN (GLUCOPHAGE) 500 MG tablet Take 2 tablets (1,000 mg total) by mouth 2 (two) times daily. 04/29/20  Yes Geradine Girt, DO  HYDROcodone-acetaminophen (NORCO/VICODIN) 5-325 MG tablet Take 1-2 tablets by mouth every 6 (six) hours as needed for moderate pain or severe pain. Patient not taking: Reported on 02/22/2022 01/21/21   Charlesetta Shanks, MD  rosuvastatin (CRESTOR) 5 MG tablet Take 5 mg by mouth See admin  instructions. Take two times weekly. Take one tablet by mouth on Tuesday & Saturdays only per patient 03/26/20   [provider]    ___________________________________________________________________________________________________ Physical Exam:    02/22/2022    6:45 PM 02/22/2022    6:30 PM 02/22/2022    6:15 PM  Vitals with BMI  Systolic 528 413 244  Diastolic 77 88 78  Pulse 97 92 91     1. General:  in No  Acute distress   Well  -appearing 2. Psychological: Alert and   Oriented  preserverating 3. Head/ENT:    Dry Mucous Membranes                          Head Non traumatic, neck supple                          Poor Dentition 4. SKIN:  decreased Skin turgor,  Skin clean Dry and intact no rash 5. Heart: Regular rate and rhythm no  Murmur, no Rub or gallop 6. Lungs: , no wheezes or crackles   7. Abdomen: Soft,  non-tender, Non distended  bowel sounds present 8. Lower extremities: no clubbing, cyanosis, no  edema 9. Neurologically Grossly intact, moving all 4 extremities equally   10. MSK: Normal range of motion limited in right leg    Chart has been reviewed  ______________________________________________________________________________________________  Assessment/Plan 71 y.o. male with medical history significant of DM2, DVT on aspirin, falls,  melanoma, HLD    Admitted for   Fall, initial encounter   Closed fracture of right hip, initial encounter ([place])    Present on Admission:  Hip fracture ([place])  Type 2 diabetes mellitus with hyperlipidemia ([place])  Hyperlipidemia  Tobacco abuse     Type 2 diabetes mellitus with hyperlipidemia ([place]) Order sliding scale Hold p.o. medications  Hip fracture ([place])  - management as per orthopedics,  plan to operate  in  a.m.   Keep nothing by mouth post midnight. Patient  not on anticoagulation .  antiplatelet agents   on hold Ordered type and screen,   order a vitamin D level  Patient at baseline able to walk a flight  of stairs or 100 feet      Patient denies any chest pain or shortness of breath currently and/or with exertion,   ECG showing no evidence of acute ischemia  no known history of coronary artery disease COPD  Liver failure  CKD  Given advanced age patient is at least moderate  risk  which has been discussed with patient  but at this point no furthther cardiac workup is indicated.    Hyperlipidemia Chronic continue Crestor  History of DVT (deep vein thrombosis) Removed we will hold aspirin for now  Tobacco abuse  - Spoke about importance of quitting spent 5 minutes discussing options for treatment, prior attempts at quitting, and dangers  of smoking  -At this point patient is   interested in quitting  - order nicotine patch   - nursing tobacco cessation protocol   Other plan as per orders.  DVT prophylaxis:  SCD       Code Status:    Code Status: Prior FULL CODE    as per patient    I had personally discussed CODE STATUS with patient     Family Communication:   Family not at  Bedside      Disposition Plan:       likely will need placement for rehabilitation                             Following barriers for discharge:                            Hip repaired                            Will need consultants to evaluate patient prior to discharge                       Consults called:  orhopedics    Admission status:  ED Disposition     ED Disposition  [place]: [facility] [[id number]]  Level of Care: Med-Surg [16]  May admit patient to [facility] or [facility] if equivalent level of care is available:: No  Covid Evaluation: Asymptomatic - no recent exposure (last 10 days) testing not required  Diagnosis: Hip fracture [facility]) [[id number]]  Admitting Physician: Toy Baker [[id number]]  Attending Physician: Toy Baker [[id number]]  Estimated length of stay: past midnight tomorrow  Certification:: I certify this  patient will need inpatient services for at least 2 midnights           inpatient     I Expect 2 midnight stay secondary to severity of patient's current illness need for inpatient interventions justified by the following:    Severe lab/radiological/exam abnormalities including:   Right hip fracture and extensive comorbidities including: DM2  CHF   That are currently affecting medical management.   I expect  patient to be hospitalized for 2 midnights requiring inpatient medical care.  Patient is at high risk for adverse outcome (such as loss of life or disability) if not treated.  Indication for inpatient stay as follows:   Need for operative/procedural  intervention     Need for IV pain medications,      Level of care         medical floor      Rodrigo Mcgranahan 02/22/2022, 11:15 PM    [facility]     after 2 AM please page floor coverage PA If 7AM-7PM, please contact the day team taking care of the patient using [email or web address]   Patient was evaluated in the context of the global COVID-19 pandemic, which necessitated consideration that the patient might be at risk for infection with the SARS-CoV-2 virus that causes COVID-19. Institutional protocols and algorithms that pertain to the evaluation of patients at risk for COVID-19 are in a state of rapid change based on information released by regulatory bodies including the CDC and federal and state organizations. These policies and algorithms were followed during the patient's care.

## 2022-02-22 NOTE — ED Triage Notes (Signed)
Patient BIB EMS from home. Patient had mechanical fall on concrete walk way . Fall happened on right hip. No LOC. No blood thinners. Right leg is short and rotated. 100 of fentanyl given by EMS. Laceration the left palm. AxOx4.  Vitals were:  132/88 96-HR 98% room air 180-CBG 18g left forearm placed by EMS

## 2022-02-22 NOTE — Consult Note (Signed)
History: Patient presents after mechanical fall with pain in his right hip.He notes no history of repair in this area.  He was in his usual state of health, when he tripped, falling on his right hip.  Since that time he has been unable to walk secondary to pain.  Patient notes that he has some degree of neck pain generally, possibly worse today.  No new weakness in any extremity, no loss of sensation in the distal right lower extremity.  Past Medical History:  Diagnosis Date   Cancer ([place])    right elbow melanoma   Diabetes mellitus without complication ([facility])    Type II   Dizziness    DVT (deep venous thrombosis) ([facility])    right leg   Frequent falls    Near syncope    Syncope     No Known Allergies  No current facility-administered medications on file prior to encounter.   Current Outpatient Medications on File Prior to Encounter  Medication Sig Dispense Refill   aspirin 325 MG tablet Take 325 mg by mouth daily.      glimepiride (AMARYL) 2 MG tablet Take 2 mg by mouth daily.      HYDROcodone-acetaminophen (NORCO/VICODIN) 5-325 MG tablet Take 1-2 tablets by mouth every 6 (six) hours as needed for moderate pain or severe pain. 20 tablet 0   metFORMIN (GLUCOPHAGE) 500 MG tablet Take 2 tablets (1,000 mg total) by mouth 2 (two) times daily. 120 tablet 0   rosuvastatin (CRESTOR) 5 MG tablet Take 5 mg by mouth 2 (two) times a week. On Tuesday & Saturday      Physical Exam: Vitals:   02/22/22 1800 02/22/22 1815  BP: 115/86 130/78  Pulse: 93 91  Resp: 10 14  Temp:    SpO2: 100% 97%   Body mass index is 24.9 kg/m. He is alert and oriented x3. No shortness of breath or chest pain. Abdomen soft and nontender.  No rebound tenderness. Significant right anterior groin pain.  Unable to passively range the right hip due to pain.  No laceration or abrasion is noted. 5/5 EHL/tibialis anterior/gastrocnemius strength bilaterally.  Sensation light touch is intact throughout the lower  extremity. Compartments soft and nontender.  2+ dorsalis pedis/posterior tibialis pulses.  Image: DG Hip Unilat W or Wo Pelvis 2-3 Views Right  Result Date: 02/22/2022 CLINICAL DATA:  Fall.  Right leg short and rotated. EXAM: DG HIP (WITH OR WITHOUT PELVIS) 2-3V RIGHT COMPARISON:  None Available. FINDINGS: There is diffuse decreased bone mineralization. There is a comminuted fracture of the right proximal femoral intertrochanteric region with mild-to-moderate diastasis and mild varus angulation. Mild bilateral femoroacetabular joint space narrowing. The pubic symphysis and right sacroiliac joint spaces are maintained. IMPRESSION: Mildly displaced and angulated, comminuted right intertrochanteric acute fracture. Electronically Signed   By: Yvonne Kendall M.D.   On: 02/22/2022 17:26   CT CERVICAL SPINE WO CONTRAST  Result Date: 02/22/2022 CLINICAL DATA:  Chronic neck pain, worse after a fall today. EXAM: CT CERVICAL SPINE WITHOUT CONTRAST TECHNIQUE: Multidetector CT imaging of the cervical spine was performed without intravenous contrast. Multiplanar CT image reconstructions were also generated. RADIATION DOSE REDUCTION: This exam was performed according to the departmental dose-optimization program which includes automated exposure control, adjustment of the mA and/or kV according to patient size and/or use of iterative reconstruction technique. COMPARISON:  01/21/2021 FINDINGS: Alignment: Imaging through the bottom of T1. Maintenance of vertebral body height. Straightening of expected lordosis. Skull base and vertebrae:  Skull base intact. Coronal reformats demonstrate a normal C1-C2 articulation. Facet arthropathy is most significant on the right at C4-5 and on the left at C2-3. Soft tissues and spinal canal: Bilateral carotid atherosclerosis. Disc levels: Loss of intervertebral disc height is most significant at C6-7. C2-3 central canal and neural foraminal narrowing secondary to uncovertebral joint  hypertrophy and disc osteophyte complex. Similar findings at C3-4, including eccentric right disc osteophyte complex. At C4-5, significant right-sided neural foraminal narrowing secondary to uncovertebral joint hypertrophy. At C5-6, central canal and neural foraminal narrowing secondary to above factors. Similar findings at C6-7 and less so C7-T1. Upper chest: No apical pneumothorax. Other: None. IMPRESSION: No acute fracture or subluxation within the cervical spine. Significant cervical spondylosis, with multilevel central canal and neural foraminal narrowing as detailed above. Nonspecific straightening of expected cervical lordosis. Electronically Signed   By: Abigail Miyamoto M.D.   On: 02/22/2022 17:52    A/P: 71 year old gentleman unfortunately fell and has significant right hip pain.  Imaging studies confirmed an intertrochanteric hip fracture.  As result orthopedic consultation was requested.  I reviewed the case with my partner Dr. Lyla Glassing we will plan on surgical treatment tomorrow.  Patient will be admitted to the medical team and we will plan on a right ORIF of the hip.

## 2022-02-22 NOTE — Assessment & Plan Note (Signed)
-   Spoke about importance of quitting spent 5 minutes discussing options for treatment, prior attempts at quitting, and dangers of smoking ? -At this point patient is    interested in quitting ? - order nicotine patch  ? - nursing tobacco cessation protocol ? ?

## 2022-02-22 NOTE — Assessment & Plan Note (Signed)
Chronic current appears to be euvolemic avoid fluid overload

## 2022-02-23 ENCOUNTER — Encounter (HOSPITAL_COMMUNITY)
Admission: EM | Disposition: A | Discharge status: Skilled Nursing Facility | Payer: Self-pay | Source: Home / Self Care | Attending: Internal Medicine

## 2022-02-23 ENCOUNTER — Inpatient Hospital Stay (HOSPITAL_COMMUNITY): Payer: Medicare HMO | Admitting: Certified Registered"

## 2022-02-23 ENCOUNTER — Encounter (HOSPITAL_COMMUNITY): Payer: Self-pay | Admitting: Internal Medicine

## 2022-02-23 ENCOUNTER — Inpatient Hospital Stay (HOSPITAL_COMMUNITY): Payer: Medicare HMO

## 2022-02-23 ENCOUNTER — Other Ambulatory Visit: Payer: Self-pay

## 2022-02-23 DIAGNOSIS — E119 Type 2 diabetes mellitus without complications: Secondary | ICD-10-CM

## 2022-02-23 DIAGNOSIS — S72001A Fracture of unspecified part of neck of right femur, initial encounter for closed fracture: Secondary | ICD-10-CM | POA: Diagnosis not present

## 2022-02-23 DIAGNOSIS — S72141A Displaced intertrochanteric fracture of right femur, initial encounter for closed fracture: Secondary | ICD-10-CM

## 2022-02-23 DIAGNOSIS — I5032 Chronic diastolic (congestive) heart failure: Secondary | ICD-10-CM | POA: Diagnosis not present

## 2022-02-23 DIAGNOSIS — I509 Heart failure, unspecified: Secondary | ICD-10-CM

## 2022-02-23 DIAGNOSIS — Z86718 Personal history of other venous thrombosis and embolism: Secondary | ICD-10-CM | POA: Diagnosis not present

## 2022-02-23 DIAGNOSIS — E1169 Type 2 diabetes mellitus with other specified complication: Secondary | ICD-10-CM | POA: Diagnosis not present

## 2022-02-23 DIAGNOSIS — F17210 Nicotine dependence, cigarettes, uncomplicated: Secondary | ICD-10-CM

## 2022-02-23 DIAGNOSIS — Z7984 Long term (current) use of oral hypoglycemic drugs: Secondary | ICD-10-CM

## 2022-02-23 HISTORY — PX: INTRAMEDULLARY (IM) NAIL INTERTROCHANTERIC: SHX5875

## 2022-02-23 LAB — PREALBUMIN: Prealbumin: 19.6 mg/dL (ref 18–38)

## 2022-02-23 LAB — CBC
HCT: 35 % — ABNORMAL LOW (ref 39.0–52.0)
Hemoglobin: 11.4 g/dL — ABNORMAL LOW (ref 13.0–17.0)
MCH: 32.1 pg (ref 26.0–34.0)
MCHC: 32.6 g/dL (ref 30.0–36.0)
MCV: 98.6 fL (ref 80.0–100.0)
Platelets: 216 10*3/uL (ref 150–400)
RBC: 3.55 MIL/uL — ABNORMAL LOW (ref 4.22–5.81)
RDW: 12.8 % (ref 11.5–15.5)
WBC: 9.1 10*3/uL (ref 4.0–10.5)
nRBC: 0 % (ref 0.0–0.2)

## 2022-02-23 LAB — GLUCOSE, CAPILLARY
Glucose-Capillary: 173 mg/dL — ABNORMAL HIGH (ref 70–99)
Glucose-Capillary: 175 mg/dL — ABNORMAL HIGH (ref 70–99)
Glucose-Capillary: 159 mg/dL — ABNORMAL HIGH (ref 70–99)
Glucose-Capillary: 146 mg/dL — ABNORMAL HIGH (ref 70–99)
Glucose-Capillary: 176 mg/dL — ABNORMAL HIGH (ref 70–99)
Glucose-Capillary: 340 mg/dL — ABNORMAL HIGH (ref 70–99)
Glucose-Capillary: 352 mg/dL — ABNORMAL HIGH (ref 70–99)

## 2022-02-23 LAB — CREATININE, SERUM
Creatinine, Ser: 0.95 mg/dL (ref 0.61–1.24)
GFR, Estimated: >60 mL/min (ref >60)

## 2022-02-23 LAB — RESP PANEL BY RT-PCR (FLU A&B, COVID) ARPGX2
Influenza A by PCR: NEGATIVE
Influenza B by PCR: NEGATIVE
SARS Coronavirus 2 by RT PCR: NEGATIVE

## 2022-02-23 LAB — SURGICAL PCR SCREEN
MRSA, PCR: NEGATIVE
Staphylococcus aureus: NEGATIVE

## 2022-02-23 LAB — VITAMIN D 25 HYDROXY (VIT D DEFICIENCY, FRACTURES): Vit D, 25-Hydroxy: 15.13 ng/mL — ABNORMAL LOW (ref 30–100)

## 2022-02-23 SURGERY — FIXATION, FRACTURE, INTERTROCHANTERIC, WITH INTRAMEDULLARY ROD
Anesthesia: Regional | Laterality: Right

## 2022-02-23 MED ORDER — ONDANSETRON HCL 4 MG PO TABS: 4.0000 mg | ORAL_TABLET | Freq: Four times a day (QID) | ORAL | Status: DC | PRN

## 2022-02-23 MED ORDER — ALBUTEROL SULFATE HFA 108 (90 BASE) MCG/ACT IN AERS
INHALATION_SPRAY | RESPIRATORY_TRACT | Status: AC
  Filled 2022-02-23: qty 6.7

## 2022-02-23 MED ORDER — LIDOCAINE 2% (20 MG/ML) 5 ML SYRINGE
INTRAMUSCULAR | Status: DC | PRN
  Administered 2022-02-23: 60 mg via INTRAVENOUS

## 2022-02-23 MED ORDER — FENTANYL CITRATE (PF) 100 MCG/2ML IJ SOLN
INTRAMUSCULAR | Status: AC
  Filled 2022-02-23: qty 2

## 2022-02-23 MED ORDER — LACTATED RINGERS IV SOLN: INTRAVENOUS | Status: DC

## 2022-02-23 MED ORDER — METOCLOPRAMIDE HCL 5 MG/ML IJ SOLN: 5.0000 mg | Freq: Three times a day (TID) | INTRAMUSCULAR | Status: DC | PRN

## 2022-02-23 MED ORDER — FENTANYL CITRATE (PF) 250 MCG/5ML IJ SOLN
INTRAMUSCULAR | Status: DC | PRN
  Administered 2022-02-23: 100 ug via INTRAVENOUS

## 2022-02-23 MED ORDER — PHENYLEPHRINE HCL-NACL 20-0.9 MG/250ML-% IV SOLN
INTRAVENOUS | Status: DC | PRN
  Administered 2022-02-23: 40 ug/min via INTRAVENOUS

## 2022-02-23 MED ORDER — POVIDONE-IODINE 10 % EX SWAB: 2.0000 "application " | Freq: Once | CUTANEOUS | Status: DC

## 2022-02-23 MED ORDER — CHLORHEXIDINE GLUCONATE 4 % EX LIQD
60.0000 mL | Freq: Once | CUTANEOUS | Status: AC
  Administered 2022-02-23: 4 via TOPICAL

## 2022-02-23 MED ORDER — FENTANYL CITRATE PF 50 MCG/ML IJ SOSY: 25.0000 ug | PREFILLED_SYRINGE | INTRAMUSCULAR | Status: DC | PRN

## 2022-02-23 MED ORDER — SODIUM CHLORIDE 0.9 % IR SOLN
Status: DC | PRN
  Administered 2022-02-23: 1000 mL

## 2022-02-23 MED ORDER — CEFAZOLIN SODIUM-DEXTROSE 2-4 GM/100ML-% IV SOLN
2.0000 g | INTRAVENOUS | Status: AC
  Administered 2022-02-23: 2 g via INTRAVENOUS

## 2022-02-23 MED ORDER — ENOXAPARIN SODIUM 40 MG/0.4ML IJ SOSY
40.0000 mg | PREFILLED_SYRINGE | INTRAMUSCULAR | Status: DC
  Administered 2022-02-24 – 2022-03-05 (×10): 40 mg via SUBCUTANEOUS
  Filled 2022-02-23 (×10): qty 0.4

## 2022-02-23 MED ORDER — ROCURONIUM BROMIDE 10 MG/ML (PF) SYRINGE
PREFILLED_SYRINGE | INTRAVENOUS | Status: AC
  Filled 2022-02-23: qty 10

## 2022-02-23 MED ORDER — CEFAZOLIN SODIUM-DEXTROSE 2-4 GM/100ML-% IV SOLN
2.0000 g | Freq: Four times a day (QID) | INTRAVENOUS | Status: AC
  Administered 2022-02-23 – 2022-02-24 (×2): 2 g via INTRAVENOUS
  Filled 2022-02-23 (×2): qty 100

## 2022-02-23 MED ORDER — DOCUSATE SODIUM 100 MG PO CAPS
100.0000 mg | ORAL_CAPSULE | Freq: Two times a day (BID) | ORAL | Status: DC
  Administered 2022-02-23 – 2022-02-27 (×9): 100 mg via ORAL
  Filled 2022-02-23 (×9): qty 1

## 2022-02-23 MED ORDER — SENNA 8.6 MG PO TABS
1.0000 | ORAL_TABLET | Freq: Two times a day (BID) | ORAL | Status: DC
  Administered 2022-02-24 – 2022-02-27 (×7): 8.6 mg via ORAL
  Filled 2022-02-23 (×9): qty 1

## 2022-02-23 MED ORDER — DEXAMETHASONE SODIUM PHOSPHATE 10 MG/ML IJ SOLN
INTRAMUSCULAR | Status: AC
  Filled 2022-02-23: qty 1

## 2022-02-23 MED ORDER — TRANEXAMIC ACID-NACL 1000-0.7 MG/100ML-% IV SOLN
1000.0000 mg | INTRAVENOUS | Status: AC
  Administered 2022-02-23: 1000 mg via INTRAVENOUS
  Filled 2022-02-23: qty 100

## 2022-02-23 MED ORDER — SUGAMMADEX SODIUM 200 MG/2ML IV SOLN
INTRAVENOUS | Status: DC | PRN
  Administered 2022-02-23: 200 mg via INTRAVENOUS

## 2022-02-23 MED ORDER — ORAL CARE MOUTH RINSE: 15.0000 mL | Freq: Once | OROMUCOSAL | Status: AC

## 2022-02-23 MED ORDER — POLYETHYLENE GLYCOL 3350 17 G PO PACK: 17.0000 g | PACK | Freq: Every day | ORAL | Status: DC | PRN

## 2022-02-23 MED ORDER — PROPOFOL 10 MG/ML IV BOLUS
INTRAVENOUS | Status: AC
  Filled 2022-02-23: qty 20

## 2022-02-23 MED ORDER — ISOPROPYL ALCOHOL 70 % SOLN
Status: DC | PRN
  Administered 2022-02-23: 1 via TOPICAL

## 2022-02-23 MED ORDER — PHENYLEPHRINE 80 MCG/ML (10ML) SYRINGE FOR IV PUSH (FOR BLOOD PRESSURE SUPPORT)
PREFILLED_SYRINGE | INTRAVENOUS | Status: AC
Start: 2022-02-23 — End: ?
  Filled 2022-02-23: qty 10

## 2022-02-23 MED ORDER — ROPIVACAINE HCL 5 MG/ML IJ SOLN
INTRAMUSCULAR | Status: DC | PRN
  Administered 2022-02-23: 30 mL

## 2022-02-23 MED ORDER — DEXAMETHASONE SODIUM PHOSPHATE 10 MG/ML IJ SOLN
INTRAMUSCULAR | Status: DC | PRN
  Administered 2022-02-23: 4 mg via INTRAVENOUS

## 2022-02-23 MED ORDER — PROPOFOL 10 MG/ML IV BOLUS
INTRAVENOUS | Status: DC | PRN
  Administered 2022-02-23: 130 mg via INTRAVENOUS

## 2022-02-23 MED ORDER — TRANEXAMIC ACID-NACL 1000-0.7 MG/100ML-% IV SOLN
1000.0000 mg | Freq: Once | INTRAVENOUS | Status: AC
  Administered 2022-02-23: 1000 mg via INTRAVENOUS
  Filled 2022-02-23: qty 100

## 2022-02-23 MED ORDER — PHENYLEPHRINE 80 MCG/ML (10ML) SYRINGE FOR IV PUSH (FOR BLOOD PRESSURE SUPPORT)
PREFILLED_SYRINGE | INTRAVENOUS | Status: DC | PRN
  Administered 2022-02-23 (×2): 160 ug via INTRAVENOUS

## 2022-02-23 MED ORDER — CEFAZOLIN SODIUM-DEXTROSE 2-4 GM/100ML-% IV SOLN
INTRAVENOUS | Status: AC
  Filled 2022-02-23: qty 100

## 2022-02-23 MED ORDER — ONDANSETRON HCL 4 MG/2ML IJ SOLN
INTRAMUSCULAR | Status: AC
  Filled 2022-02-23: qty 2

## 2022-02-23 MED ORDER — ROCURONIUM BROMIDE 10 MG/ML (PF) SYRINGE
PREFILLED_SYRINGE | INTRAVENOUS | Status: DC | PRN
  Administered 2022-02-23: 60 mg via INTRAVENOUS

## 2022-02-23 MED ORDER — SODIUM CHLORIDE 0.9 % IV SOLN: INTRAVENOUS | Status: DC

## 2022-02-23 MED ORDER — METOCLOPRAMIDE HCL 5 MG PO TABS: 5.0000 mg | ORAL_TABLET | Freq: Three times a day (TID) | ORAL | Status: DC | PRN

## 2022-02-23 MED ORDER — CHLORHEXIDINE GLUCONATE 0.12 % MT SOLN
15.0000 mL | Freq: Once | OROMUCOSAL | Status: AC
  Administered 2022-02-23: 15 mL via OROMUCOSAL

## 2022-02-23 MED ORDER — MENTHOL 3 MG MT LOZG: 1.0000 | LOZENGE | OROMUCOSAL | Status: DC | PRN

## 2022-02-23 MED ORDER — ACETAMINOPHEN 500 MG PO TABS: 1000.0000 mg | ORAL_TABLET | Freq: Once | ORAL | Status: AC

## 2022-02-23 MED ORDER — ONDANSETRON HCL 4 MG/2ML IJ SOLN: 4.0000 mg | Freq: Four times a day (QID) | INTRAMUSCULAR | Status: DC | PRN

## 2022-02-23 MED ORDER — MIDAZOLAM HCL 2 MG/2ML IJ SOLN
INTRAMUSCULAR | Status: AC
  Filled 2022-02-23: qty 2

## 2022-02-23 MED ORDER — ONDANSETRON HCL 4 MG/2ML IJ SOLN
INTRAMUSCULAR | Status: DC | PRN
  Administered 2022-02-23: 4 mg via INTRAVENOUS

## 2022-02-23 MED ORDER — ADULT MULTIVITAMIN W/MINERALS CH
1.0000 | ORAL_TABLET | Freq: Every day | ORAL | Status: DC
  Administered 2022-02-24 – 2022-03-05 (×10): 1 via ORAL
  Filled 2022-02-23 (×10): qty 1

## 2022-02-23 MED ORDER — FENTANYL CITRATE PF 50 MCG/ML IJ SOSY
PREFILLED_SYRINGE | INTRAMUSCULAR | Status: AC
  Administered 2022-02-23: 50 ug
  Filled 2022-02-23: qty 2

## 2022-02-23 MED ORDER — EPHEDRINE SULFATE-NACL 50-0.9 MG/10ML-% IV SOSY
PREFILLED_SYRINGE | INTRAVENOUS | Status: DC | PRN
  Administered 2022-02-23: 5 mg via INTRAVENOUS
  Administered 2022-02-23: 10 mg via INTRAVENOUS

## 2022-02-23 MED ORDER — PHENOL 1.4 % MT LIQD: 1.0000 | OROMUCOSAL | Status: DC | PRN

## 2022-02-23 MED ORDER — ACETAMINOPHEN 500 MG PO TABS
ORAL_TABLET | ORAL | Status: AC
  Administered 2022-02-23: 1000 mg via ORAL
  Filled 2022-02-23: qty 2

## 2022-02-23 MED ORDER — ENSURE SURGERY PO LIQD
237.0000 mL | Freq: Two times a day (BID) | ORAL | Status: DC
  Administered 2022-02-23 – 2022-02-24 (×2): 237 mL via ORAL

## 2022-02-23 SURGICAL SUPPLY — 44 items
BAG COUNTER SPONGE SURGICOUNT (BAG) ×1 IMPLANT
BAG ZIPLOCK 12X15 (MISCELLANEOUS) ×1 IMPLANT
BIT DRILL CANN LG 4.3MM (BIT) IMPLANT
CHLORAPREP W/TINT 26 (MISCELLANEOUS) ×2 IMPLANT
COVER PERINEAL POST (MISCELLANEOUS) ×2 IMPLANT
COVER SURGICAL LIGHT HANDLE (MISCELLANEOUS) ×2 IMPLANT
DERMABOND ADVANCED (GAUZE/BANDAGES/DRESSINGS) ×2
DERMABOND ADVANCED .7 DNX12 (GAUZE/BANDAGES/DRESSINGS) ×2 IMPLANT
DRAPE C-ARM 42X120 X-RAY (DRAPES) ×2 IMPLANT
DRAPE C-ARMOR (DRAPES) ×2 IMPLANT
DRAPE IMP U-DRAPE 54X76 (DRAPES) ×4 IMPLANT
DRAPE SHEET LG 3/4 BI-LAMINATE (DRAPES) ×4 IMPLANT
DRAPE STERI IOBAN 125X83 (DRAPES) ×2 IMPLANT
DRAPE U-SHAPE 47X51 STRL (DRAPES) ×4 IMPLANT
DRESSING MEPILEX FLEX 4X4 (GAUZE/BANDAGES/DRESSINGS) ×2 IMPLANT
DRILL BIT CANN LG 4.3MM (BIT) ×2
DRSG MEPILEX BORDER 4X8 (GAUZE/BANDAGES/DRESSINGS) ×1 IMPLANT
DRSG MEPILEX FLEX 4X4 (GAUZE/BANDAGES/DRESSINGS) ×4
ELECT BLADE TIP CTD 4 INCH (ELECTRODE) IMPLANT
FACESHIELD WRAPAROUND (MASK) ×4 IMPLANT
FACESHIELD WRAPAROUND OR TEAM (MASK) ×2 IMPLANT
GAUZE SPONGE 4X4 12PLY STRL (GAUZE/BANDAGES/DRESSINGS) ×2 IMPLANT
GLOVE BIO SURGEON STRL SZ8.5 (GLOVE) ×4 IMPLANT
GLOVE BIOGEL M 7.0 STRL (GLOVE) ×2 IMPLANT
GLOVE BIOGEL PI IND STRL 7.5 (GLOVE) ×1 IMPLANT
GLOVE BIOGEL PI IND STRL 8 (GLOVE) ×1 IMPLANT
GLOVE BIOGEL PI IND STRL 8.5 (GLOVE) ×1 IMPLANT
GLOVE BIOGEL PI INDICATOR 7.5 (GLOVE) ×1
GLOVE BIOGEL PI INDICATOR 8 (GLOVE) ×1
GLOVE BIOGEL PI INDICATOR 8.5 (GLOVE) ×1
GLOVE SURG LX 7.5 STRW (GLOVE) ×2
GLOVE SURG LX STRL 7.5 STRW (GLOVE) ×2 IMPLANT
GOWN SPEC L3 XXLG W/TWL (GOWN DISPOSABLE) ×2 IMPLANT
GUIDEPIN VERSANAIL DSP 3.2X444 (ORTHOPEDIC DISPOSABLE SUPPLIES) ×1 IMPLANT
HFN 125 DEG 11MM X 180MM (Orthopedic Implant) ×1 IMPLANT
KIT BASIN OR (CUSTOM PROCEDURE TRAY) ×2 IMPLANT
MANIFOLD NEPTUNE II (INSTRUMENTS) ×2 IMPLANT
MARKER SKIN DUAL TIP RULER LAB (MISCELLANEOUS) ×2 IMPLANT
PACK GENERAL/GYN (CUSTOM PROCEDURE TRAY) ×2 IMPLANT
SCREW BONE CORTICAL 5.0X42 (Screw) ×1 IMPLANT
SCREW LAG 10.5MMX105MM HFN (Screw) ×1 IMPLANT
SUT MNCRL AB 3-0 PS2 18 (SUTURE) ×2 IMPLANT
SUT VIC AB 1 CT1 36 (SUTURE) ×2 IMPLANT
TOWEL OR 17X26 10 PK STRL BLUE (TOWEL DISPOSABLE) ×2 IMPLANT

## 2022-02-23 NOTE — Progress Notes (Addendum)
AssistedDr. Jana Half with right, pericapsular nerve group (PENG), ultrasound guided block. Side rails up, monitors on throughout procedure. See vital signs in flow sheet. Tolerated Procedure well.

## 2022-02-23 NOTE — Progress Notes (Signed)
PROGRESS NOTE    [name].  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 Alen Blew and dermatology Dr. Ronnald Ramp   Smoker  5 cigarretts a day   I have Reviewed nursing notes, Vitals, pain scores, I/o's, Lab results and  imaging results since pt's last encounter, details please see discussion above  I ordered the following labs:  Unresulted Labs (From admission, onward)     Start     Ordered   03/02/22 0500  Creatinine, serum  (enoxaparin (LOVENOX)  CrCl >/= 30 mL/min  )   Weekly,   R     Comments: while on enoxaparin therapy.    02/23/22 1336   02/24/22 0500  CBC  Daily,   R     Comments: For 3 days.    02/23/22 1336   02/24/22 8119  Basic metabolic panel  Daily,   R     Comments: For 2 days .    02/23/22 1336   02/23/22 1337  CBC  (enoxaparin (LOVENOX)  CrCl >/= 30 mL/min  )  Once,   R       Comments: Baseline for enoxaparin therapy IF NOT ALREADY DRAWN. Notify MD if PLT < 100 K.    02/23/22 1336   02/23/22 1337  Creatinine, serum  (enoxaparin (LOVENOX)  CrCl >/= 30 mL/min  )  Once,   R       Comments: Baseline for enoxaparin therapy IF NOT ALREADY DRAWN.    02/23/22 1336             DVT prophylaxis: enoxaparin (LOVENOX) injection 40 mg Start: 02/24/22 0800 SCDs Start: 02/23/22 1337   Code Status:   Code Status: Full Code  Family Communication: family at bedside  Disposition:    Dispo: The patient is from: home , baseline independent              Anticipated d/c is to: pending PT eval              Anticipated d/c date is: 24-48hrs  Antimicrobials:   Anti-infectives (From admission, onward)    Start     Dose/Rate Route Frequency Ordered Stop   02/23/22 1700  ceFAZolin (ANCEF) IVPB 2g/100 mL premix        2 g 200 mL/hr over 30 Minutes Intravenous Every 6 hours 02/23/22 1336 02/24/22 0459   02/23/22 0811  ceFAZolin (ANCEF) 2-4 GM/100ML-% IVPB       Note to Pharmacy: Jasmine Pang K: cabinet override      02/23/22 0811 02/23/22 1133   02/23/22 0745  ceFAZolin (ANCEF) IVPB 2g/100 mL premix        2 g 200 mL/hr over 30 Minutes Intravenous On call to O.R. 02/23/22 0655 02/23/22 1107            Objective: Vitals:   02/23/22 1230 02/23/22 1245 02/23/22 1300 02/23/22 1349  BP: (!) 100/55 (!) 101/48 (!) 113/55 104/64  Pulse: 89 85 87 96  Resp: '18 13 10 20  '$ Temp:   97.7 F (36.5 C) 97.6 F (36.4 C)  TempSrc:    Oral  SpO2: 100% 96% 98% 100%  Weight:      Height:        Intake/Output Summary (Last 24 hours) at  02/23/2022 1450 Last data filed at 02/23/2022 1300 Gross per 24 hour  Intake 2336 ml  Output 650 ml  Net 1686 ml   Filed Weights   02/22/22 1614 02/22/22 2203 02/23/22 0804  Weight: 95.3 kg 88.5 kg 88.5 kg    Examination:  General exam: alert, awake, communicative,calm, NAD Respiratory system: Clear to auscultation. Respiratory effort normal. Cardiovascular system:  RRR.  Gastrointestinal system: Abdomen is nondistended, soft and nontender.  Normal bowel sounds heard. Central nervous system: Alert and oriented. No focal neurological deficits. Extremities:  right hip post op changes  Skin: No rashes, lesions or ulcers Psychiatry: Judgement and insight appear normal. Mood & affect appropriate.     Data Reviewed: I have personally reviewed  labs and visualized  imaging studies since the last encounter and formulate the plan        Scheduled Meds:  docusate sodium  100 mg Oral BID   [START ON 02/24/2022] enoxaparin (LOVENOX) injection  40 mg Subcutaneous Q24H   feeding supplement  237 mL Oral BID BM   insulin aspart  0-9 Units Subcutaneous Q4H   [START ON 02/24/2022] multivitamin with minerals  1 tablet Oral Daily   nicotine  14 mg Transdermal Daily   senna  1 tablet Oral BID   Continuous Infusions:  sodium chloride 100 mL/hr at 02/23/22 1355    ceFAZolin (ANCEF) IV     methocarbamol (ROBAXIN) IV     tranexamic acid       LOS: 1 day     Florencia Reasons, MD PhD FACP [facility]  Available via Epic secure chat 7am-7pm for nonurgent issues Please page for urgent issues To page the attending provider between 7A-7P or the covering provider during after hours 7P-7A, please log into the web site [email or web address] and access using universal [place] password for that web site. If you do not have the password, please call the hospital operator.    02/23/2022, 2:50 PM

## 2022-02-23 NOTE — Op Note (Signed)
OPERATIVE REPORT  SURGEON: Rod Can, MD   ASSISTANT: Larene Pickett, PA-C.  PREOPERATIVE DIAGNOSIS: Right intertrochanteric femur fracture.   POSTOPERATIVE DIAGNOSIS: Right intertrochanteric femur fracture.   PROCEDURE: Intramedullary fixation, Right femur.   IMPLANTS: Biomet Affixus Hip Fracture Nail, 11 by 180 mm, 125 degrees. 10.5 x 105 mm Hip Fracture Nail Lag Screw. 5 x 42 mm distal interlocking screw 1.  ANESTHESIA:  General and Regional  ESTIMATED BLOOD LOSS:-50 mL    ANTIBIOTICS:  2 g Ancef.  DRAINS: None.  COMPLICATIONS: None.   CONDITION: PACU - hemodynamically stable.Marland Kitchen   BRIEF CLINICAL NOTE: Micheal Moore. is a 71 y.o. male who presented with an intertrochanteric femur fracture. The patient was admitted to the hospitalist service and underwent perioperative risk stratification and medical optimization. The risks, benefits, and alternatives to the procedure were explained, and the patient elected to proceed.  PROCEDURE IN DETAIL: Surgical site was marked by myself. Regional block was placed by anesthesia. The patient was taken to the operating room and general anesthesia was induced on the bed. The patient was then transferred to the [facility] table and the nonoperative lower extremity was scissored underneath the operative side. The fracture was reduced with traction, internal rotation, and adduction. The hip was prepped and draped in the normal sterile surgical fashion. Timeout was called verifying side and site of surgery. Preop antibiotics were given with 60 minutes of beginning the procedure.  Fluoroscopy was used to define the patient's anatomy. A 4 cm incision was made just proximal to the tip of the greater trochanter. The awl was used to obtain the standard starting point for a trochanteric entry nail under fluoroscopic control. The guidepin was placed. The entry reamer was used to open the proximal femur.  On the back table, the nail was assembled onto  the jig. The nail was placed into the femur without any difficulty. Through a separate stab incision, the cannula was placed down to the bone in preparation for the cephalomedullary device. A guidepin was placed into the femoral head using AP and lateral fluoroscopy views. The pin was measured, and then reaming was performed to the appropriate depth. The lag screw was inserted to the appropriate depth. The fracture was compressed through the jig. The setscrew was tightened and then loosened one quarter turn. A separate stab incision was created, and the distal interlocking screw was placed using standard AO technique. The jig was removed. Final AP and lateral fluoroscopy views were obtained to confirm fracture reduction and hardware placement. Tip apex distance was appropriate. There was no chondral penetration.  The wounds were copiously irrigated with saline. The wound was closed in layers with #1 Vicryl for the fascia, 2-0 Monocryl for the deep dermal layer, and staples + Dermabond for the skin. Once the glue was fully hardened, sterile dressing was applied. The patient was then awakened from anesthesia and taken to the PACU in stable condition. Sponge needle and instrument counts were correct at the end of the case 2. There were no known complications.  We will readmit the patient to the hospitalist. Weightbearing status will be weightbearing as tolerated with a walker. We will begin Lovenox for DVT prophylaxis. The patient will mobilize with physical therapy and undergo disposition planning.  Please note that a surgical assistant was a medical necessity for this procedure to perform it in a safe and expeditious manner. Assistant was necessary to provide appropriate retraction of vital neurovascular structures, to prevent femoral fracture, and to allow for anatomic  placement of the prosthesis.

## 2022-02-23 NOTE — Transfer of Care (Signed)
Immediate Anesthesia Transfer of Care Note  Patient: Micheal Moore.  Procedure(s) Performed: INTRAMEDULLARY (IM) NAIL INTERTROCHANTRIC, RIGHT (Right)  Patient Location: PACU  Anesthesia Type:General  Level of Consciousness: awake, drowsy and patient cooperative  Airway & Oxygen Therapy: Patient Spontanous Breathing and Patient connected to face mask oxygen  Post-op Assessment: Report given to RN and Post -op Vital signs reviewed and stable  Post vital signs: Reviewed and stable  Last Vitals:  Vitals Value Taken Time  BP 119/68 02/23/22 1222  Temp    Pulse 88 02/23/22 1224  Resp 19 02/23/22 1224  SpO2 100 % 02/23/22 1224  Vitals shown include unvalidated device data.  Last Pain:  Vitals:   02/23/22 0910  TempSrc:   PainSc: Asleep      Patients Stated Pain Goal: 3 (89/16/94 5038)  Complications: No notable events documented.

## 2022-02-23 NOTE — Progress Notes (Signed)
Initial Nutrition Assessment  DOCUMENTATION CODES:   Not applicable  INTERVENTION:  - will order Ensure Surgery BID, each supplement provides 330 kcal and 18 grams of protein. - will order 1 tablet multivitamin with minerals/day. - complete NFPE when feasible.    NUTRITION DIAGNOSIS:   Increased nutrient needs related to hip fracture, post-op healing as evidenced by estimated needs.  GOAL:   Patient will meet greater than or equal to 90% of their needs  MONITOR:   Diet advancement, PO intake, Supplement acceptance, Labs, Weight trends, Skin  REASON FOR ASSESSMENT:   Consult Hip fracture protocol  ASSESSMENT:   71 y.o. male with medical history of type 2 DM, DVT on aspirin, frequent falls, tobacco use, melanoma, and HLD. He presented to the ED after a fall onto a concrete walkway landing on his R hip. He was admitted for R hip fracture.  Patient is out of the room to OR for IM nail R femur. He has not been seen by a [place] RD at any time in the past. He has been NPO since admission.  Weight today is 195 lb and weight has been stable since 11/07/21. Weight on 01/21/21 was 214 lb. This indicates 19 lb weight loss (9% body weight) in the past 13 months; not significant  for time frame.    Labs reviewed; CBGs: 173, 175, 159, 146 mg/dl. Medications reviewed; sliding scale novolog.    NUTRITION - FOCUSED PHYSICAL EXAM:  Patient out of the room to OR.  Diet Order:   Diet Order             Diet NPO time specified Except for: Sips with Meds  Diet effective midnight           Diet NPO time specified  Diet effective ____                   EDUCATION NEEDS:   No education needs have been identified at this time  Skin:  Skin Assessment: Reviewed RN Assessment  Last BM:  PTA/unknown  Height:   Ht Readings from Last 1 Encounters:  02/23/22 '6\' 5"'$  (1.956 m)    Weight:   Wt Readings from Last 1 Encounters:  02/23/22 88.5 kg     BMI:  Body mass index is  23.14 kg/m.  Estimated Nutritional Needs:  Kcal:  2200-2400 kcal Protein:  105-120 grams Fluid:  >/= 2.5 L/day      Jarome Matin, MS, RD, LDN Registered Dietitian II Inpatient Clinical Nutrition RD pager # and on-call/weekend pager # available in [facility]

## 2022-02-23 NOTE — Anesthesia Postprocedure Evaluation (Signed)
Anesthesia Post Note  Patient: Micheal Moore.  Procedure(s) Performed: INTRAMEDULLARY (IM) NAIL INTERTROCHANTRIC, RIGHT (Right)     Patient location during evaluation: PACU Anesthesia Type: Regional and General Level of consciousness: awake and alert Pain management: pain level controlled Vital Signs Assessment: post-procedure vital signs reviewed and stable Respiratory status: spontaneous breathing, nonlabored ventilation, respiratory function stable and patient connected to nasal cannula oxygen Cardiovascular status: blood pressure returned to baseline and stable Postop Assessment: no apparent nausea or vomiting Anesthetic complications: no   No notable events documented.  Last Vitals:  Vitals:   02/23/22 1245 02/23/22 1300  BP: (!) 101/48 (!) 113/55  Pulse: 85 87  Resp: 13 10  Temp:  36.5 C  SpO2: 96% 98%    Last Pain:  Vitals:   02/23/22 1300  TempSrc:   PainSc: 0-No pain                 Belenda Cruise P Shelda Truby

## 2022-02-23 NOTE — Anesthesia Preprocedure Evaluation (Addendum)
Anesthesia Evaluation  Patient identified by MRN, date of birth, ID band Patient awake    Reviewed: Allergy & Precautions, NPO status , Patient's Chart, lab work & pertinent test results  Airway Mallampati: II  TM Distance: >3 FB Neck ROM: Full    Dental  (+) Poor Dentition, Missing   Pulmonary neg pulmonary ROS, Current Smoker and Patient abstained from smoking.,    Pulmonary exam normal        Cardiovascular +CHF and + DVT   Rhythm:Regular Rate:Normal     Neuro/Psych negative neurological ROS  negative psych ROS   GI/Hepatic negative GI ROS, Neg liver ROS,   Endo/Other  diabetes, Type 2, Oral Hypoglycemic Agents  Renal/GU negative Renal ROS  negative genitourinary   Musculoskeletal   Abdominal Normal abdominal exam  (+)   Peds  Hematology negative hematology ROS (+)   Anesthesia Other Findings   Reproductive/Obstetrics                            Anesthesia Physical Anesthesia Plan  ASA: 3  Anesthesia Plan: General and Regional   Post-op Pain Management: Regional block* and Tylenol PO (pre-op)*   Induction: Intravenous  PONV Risk Score and Plan: 1 and Ondansetron and Treatment may vary due to age or medical condition  Airway Management Planned: Mask and Oral ETT  Additional Equipment: None  Intra-op Plan:   Post-operative Plan: Extubation in OR  Informed Consent: I have reviewed the patients History and Physical, chart, labs and discussed the procedure including the risks, benefits and alternatives for the proposed anesthesia with the patient or authorized representative who has indicated his/her understanding and acceptance.     Dental advisory given  Plan Discussed with: CRNA  Anesthesia Plan Comments: (Last CBC Lab Results      Component                Value               Date                      WBC                      9.5                 02/22/2022                 HGB                      12.3 (L)            02/22/2022                HCT                      37.0 (L)            02/22/2022                MCV                      96.1                02/22/2022                MCH  31.9                02/22/2022                RDW                      12.7                02/22/2022                PLT                      226                 02/22/2022            Lab Results      Component                Value               Date                      NA                       138                 02/22/2022                K                        4.9                 02/22/2022                CO2                      23                  02/22/2022                GLUCOSE                  152 (H)             02/22/2022                BUN                      22                  02/22/2022                CREATININE               1.04                02/22/2022                CALCIUM                  8.9                 02/22/2022                EGFR                     79  12/04/2021                GFRNONAA                 >60                 02/22/2022          )       Anesthesia Quick Evaluation

## 2022-02-23 NOTE — Anesthesia Procedure Notes (Signed)
Procedure Name: Intubation Date/Time: 02/23/2022 11:03 AM  Performed by: Eben Burow, CRNAPre-anesthesia Checklist: Patient identified, Emergency Drugs available, Suction available, Patient being monitored and Timeout performed Patient Re-evaluated:Patient Re-evaluated prior to induction Oxygen Delivery Method: Circle system utilized Preoxygenation: Pre-oxygenation with 100% oxygen Induction Type: IV induction Ventilation: Mask ventilation without difficulty Laryngoscope Size: Mac and 4 Grade View: Grade I Tube type: Oral Tube size: 7.5 mm Number of attempts: 1 Airway Equipment and Method: Stylet Placement Confirmation: ETT inserted through vocal cords under direct vision, positive ETCO2 and breath sounds checked- equal and bilateral Secured at: 24 cm Tube secured with: Tape Dental Injury: Teeth and Oropharynx as per pre-operative assessment

## 2022-02-23 NOTE — Anesthesia Procedure Notes (Signed)
Anesthesia Regional Block: Peng block   Pre-Anesthetic Checklist: , timeout performed,  Correct Patient, Correct Site, Correct Laterality,  Correct Procedure, Correct Position, site marked,  Risks and benefits discussed,  Surgical consent,  Pre-op evaluation,  At surgeon's request and post-op pain management  Laterality: Right  Prep: Dura Prep       Needles:  Injection technique: Single-shot  Needle Type: Echogenic Stimulator Needle     Needle Length: 10cm  Needle Gauge: 20     Additional Needles:   Procedures:,,,, ultrasound used (permanent image in chart),,    Narrative:  Start time: 02/23/2022 9:07 AM End time: 02/23/2022 9:10 AM Injection made incrementally with aspirations every 5 mL.  Performed by: Personally  Anesthesiologist: Darral Dash, DO  Additional Notes: Patient identified. Risks/Benefits/Options discussed with patient including but not limited to bleeding, infection, nerve damage, failed block, incomplete pain control. Patient expressed understanding and wished to proceed. All questions were answered. Sterile technique was used throughout the entire procedure. Please see nursing notes for vital signs. Aspirated in 5cc intervals with injection for negative confirmation. Patient was given instructions on fall risk and not to get out of bed. All questions and concerns addressed with instructions to call with any issues or inadequate analgesia.

## 2022-02-23 NOTE — Discharge Instructions (Signed)
 Dr. Mccall Will Adult Hip & Knee Specialist [facility] [address] [place], [place] [address] [phone]   POSTOPERATIVE DIRECTIONS    Hip Rehabilitation, Guidelines Following Surgery   WEIGHT BEARING Weight bearing as tolerated with assist device (walker, cane, etc) as directed, use it as long as suggested by your surgeon or therapist, typically at least 4-6 weeks.   HOME CARE INSTRUCTIONS  Remove items at home which could result in a fall. This includes throw rugs or furniture in walking pathways.  Continue medications as instructed at time of discharge.  You may have some home medications which will be placed on hold until you complete the course of blood thinner medication.  4 days after discharge, you may start showering. No tub baths or soaking your incisions. Do not put on socks or shoes without following the instructions of your caregivers.   Sit on chairs with arms. Use the chair arms to help push yourself up when arising.  Arrange for the use of a toilet seat elevator so you are not sitting low.   Walk with walker as instructed.  You may resume a sexual relationship in one month or when given the OK by your caregiver.  Use walker as long as suggested by your caregivers.  Avoid periods of inactivity such as sitting longer than an hour when not asleep. This helps prevent blood clots.  You may return to work once you are cleared by your surgeon.  Do not drive a car for 6 weeks or until released by your surgeon.  Do not drive while taking narcotics.  Wear elastic stockings for two weeks following surgery during the day but you may remove then at night.  Make sure you keep all of your appointments after your operation with all of your doctors and caregivers. You should call the office at the above phone number and make an appointment for approximately two weeks after the date of your surgery. Please pick up a stool softener and laxative  for home use as long as you are requiring pain medications.  ICE to the affected hip every three hours for 30 minutes at a time and then as needed for pain and swelling. Continue to use ice on the hip for pain and swelling from surgery. You may notice swelling that will progress down to the foot and ankle.  This is normal after surgery.  Elevate the leg when you are not up walking on it.   It is important for you to complete the blood thinner medication as prescribed by your doctor.  Continue to use the breathing machine which will help keep your temperature down.  It is common for your temperature to cycle up and down following surgery, especially at night when you are not up moving around and exerting yourself.  The breathing machine keeps your lungs expanded and your temperature down.  RANGE OF MOTION AND STRENGTHENING EXERCISES  These exercises are designed to help you keep full movement of your hip joint. Follow your caregiver's or physical therapist's instructions. Perform all exercises about fifteen times, three times per day or as directed. Exercise both hips, even if you have had only one joint replacement. These exercises can be done on a training (exercise) mat, on the floor, on a table or on a bed. Use whatever works the best and is most comfortable for you. Use music or television while you are exercising so that the exercises are a pleasant break in your day. This   will make your life better with the exercises acting as a break in routine you can look forward to.  Lying on your back, slowly slide your foot toward your buttocks, raising your knee up off the floor. Then slowly slide your foot back down until your leg is straight again.  Lying on your back spread your legs as far apart as you can without causing discomfort.  Lying on your side, raise your upper leg and foot straight up from the floor as far as is comfortable. Slowly lower the leg and repeat.  Lying on your back, tighten up the  muscle in the front of your thigh (quadriceps muscles). You can do this by keeping your leg straight and trying to raise your heel off the floor. This helps strengthen the largest muscle supporting your knee.  Lying on your back, tighten up the muscles of your buttocks both with the legs straight and with the knee bent at a comfortable angle while keeping your heel on the floor.   SKILLED REHAB INSTRUCTIONS: If the patient is transferred to a skilled rehab facility following release from the hospital, a list of the current medications will be sent to the facility for the patient to continue.  When discharged from the skilled rehab facility, please have the facility set up the patient's Home Health Physical Therapy prior to being released. Also, the skilled facility will be responsible for providing the patient with their medications at time of release from the facility to include their pain medication and their blood thinner medication. If the patient is still at the rehab facility at time of the two week follow up appointment, the skilled rehab facility will also need to assist the patient in arranging follow up appointment in our office and any transportation needs.  MAKE SURE YOU:  Understand these instructions.  Will watch your condition.  Will get help right away if you are not doing well or get worse.  Pick up stool softner and laxative for home use following surgery while on pain medications. Daily dry dressing changes as needed. In 4 days, you may remove your dressings and begin taking showers - no tub baths or soaking the incisions. Continue to use ice for pain and swelling after surgery. Do not use any lotions or creams on the incision until instructed by your surgeon.   

## 2022-02-23 NOTE — Interval H&P Note (Signed)
History and Physical Interval Note:  02/23/2022 10:24 AM  Micheal Moore.  has presented today for surgery, with the diagnosis of right intertroch nail.  The various methods of treatment have been discussed with the patient and family. After consideration of risks, benefits and other options for treatment, the patient has consented to  Procedure(s): INTRAMEDULLARY (IM) NAIL INTERTROCHANTRIC, RIGHT (Right) as a surgical intervention.  The patient's history has been reviewed, patient examined, no change in status, stable for surgery.  I have reviewed the patient's chart and labs.  Questions were answered to the patient's satisfaction.    The risks, benefits, and alternatives were discussed with the patient. There are risks associated with the surgery including, but not limited to, problems with anesthesia (death), infection, differences in leg length/angulation/rotation, fracture of bones, loosening or failure of implants, malunion, nonunion, hematoma (blood accumulation) which may require surgical drainage, blood clots, pulmonary embolism, nerve injury (foot drop), and blood vessel injury. The patient understands these risks and elects to proceed.    Micheal Moore

## 2022-02-24 DIAGNOSIS — I5032 Chronic diastolic (congestive) heart failure: Secondary | ICD-10-CM | POA: Diagnosis not present

## 2022-02-24 DIAGNOSIS — I951 Orthostatic hypotension: Secondary | ICD-10-CM

## 2022-02-24 DIAGNOSIS — S72001A Fracture of unspecified part of neck of right femur, initial encounter for closed fracture: Secondary | ICD-10-CM | POA: Diagnosis not present

## 2022-02-24 DIAGNOSIS — E1169 Type 2 diabetes mellitus with other specified complication: Secondary | ICD-10-CM | POA: Diagnosis not present

## 2022-02-24 DIAGNOSIS — D62 Acute posthemorrhagic anemia: Secondary | ICD-10-CM

## 2022-02-24 DIAGNOSIS — Z86718 Personal history of other venous thrombosis and embolism: Secondary | ICD-10-CM | POA: Diagnosis not present

## 2022-02-24 LAB — BASIC METABOLIC PANEL
Anion gap: 7 (ref 5–15)
BUN: 24 mg/dL — ABNORMAL HIGH (ref 8–23)
CO2: 26 mmol/L (ref 22–32)
Calcium: 8.9 mg/dL (ref 8.9–10.3)
Chloride: 103 mmol/L (ref 98–111)
Creatinine, Ser: 1.03 mg/dL (ref 0.61–1.24)
GFR, Estimated: >60 mL/min (ref >60)
Glucose, Bld: 198 mg/dL — ABNORMAL HIGH (ref 70–99)
Potassium: 4.3 mmol/L (ref 3.5–5.1)
Sodium: 136 mmol/L (ref 135–145)

## 2022-02-24 LAB — GLUCOSE, CAPILLARY
Glucose-Capillary: 165 mg/dL — ABNORMAL HIGH (ref 70–99)
Glucose-Capillary: 168 mg/dL — ABNORMAL HIGH (ref 70–99)
Glucose-Capillary: 180 mg/dL — ABNORMAL HIGH (ref 70–99)
Glucose-Capillary: 193 mg/dL — ABNORMAL HIGH (ref 70–99)
Glucose-Capillary: 197 mg/dL — ABNORMAL HIGH (ref 70–99)
Glucose-Capillary: 337 mg/dL — ABNORMAL HIGH (ref 70–99)

## 2022-02-24 LAB — CBC
HCT: 29.2 % — ABNORMAL LOW (ref 39.0–52.0)
Hemoglobin: 9.6 g/dL — ABNORMAL LOW (ref 13.0–17.0)
MCH: 32.1 pg (ref 26.0–34.0)
MCHC: 32.9 g/dL (ref 30.0–36.0)
MCV: 97.7 fL (ref 80.0–100.0)
Platelets: 174 10*3/uL (ref 150–400)
RBC: 2.99 MIL/uL — ABNORMAL LOW (ref 4.22–5.81)
RDW: 12.8 % (ref 11.5–15.5)
WBC: 6.8 10*3/uL (ref 4.0–10.5)
nRBC: 0 % (ref 0.0–0.2)

## 2022-02-24 MED ORDER — SODIUM CHLORIDE 0.9 % IV BOLUS
1000.0000 mL | Freq: Once | INTRAVENOUS | Status: AC
  Administered 2022-02-24: 1000 mL via INTRAVENOUS

## 2022-02-24 MED ORDER — POLYETHYLENE GLYCOL 3350 17 G PO PACK
17.0000 g | PACK | Freq: Every day | ORAL | Status: DC
  Administered 2022-02-24 – 2022-03-05 (×6): 17 g via ORAL
  Filled 2022-02-24 (×7): qty 1

## 2022-02-24 MED ORDER — GLUCERNA SHAKE PO LIQD
237.0000 mL | Freq: Three times a day (TID) | ORAL | Status: DC
  Administered 2022-02-24 – 2022-03-05 (×21): 237 mL via ORAL
  Filled 2022-02-24 (×28): qty 237

## 2022-02-24 MED ORDER — SODIUM CHLORIDE 0.9 % IV SOLN: INTRAVENOUS | Status: DC

## 2022-02-24 NOTE — Progress Notes (Addendum)
Micheal Moore  MRN: [id number] DOB/Age: 71-Jun-1952 71 y.o. [facility] Procedure: Procedure(s) (LRB): INTRAMEDULLARY (IM) NAIL INTERTROCHANTRIC, RIGHT (Right)     Subjective: Right hip painful as expected, has been OOB to [employer]  Vital Signs Temp:  [97.6 F (36.4 C)-98.9 F (37.2 C)] 98.4 F (36.9 C) (06/10 0411) Pulse Rate:  [80-103] 96 (06/10 0831) Resp:  [10-20] 18 (06/10 0411) BP: (100-130)/(48-83) 106/57 (06/10 0831) SpO2:  [94 %-100 %] 96 % (06/10 0411)  Lab Results Recent Labs    02/23/22 1353 02/24/22 0346  WBC 9.1 6.8  HGB 11.4* 9.6*  HCT 35.0* 29.2*  PLT 216 174   BMET Recent Labs    02/22/22 1649 02/23/22 1353 02/24/22 0346  NA 138  --  136  K 4.9  --  4.3  CL 109  --  103  CO2 23  --  26  GLUCOSE 152*  --  198*  BUN 22  --  24*  CREATININE 1.04 0.95 1.03  CALCIUM 8.9  --  8.9   INR  Date Value Ref Range Status  01/21/2021 1.0 0.8 - 1.2 Final    Comment:    (NOTE) INR goal varies based on device and disease states. Performed at [facility], [place] [address], [place], [place] [address]      Exam Right hip and thigh dressings dry,  NVI        Plan Mobilize with PT WBAT Continue disposition per hospitalist Lovenox while inpatient Convert to aspirin at discharge x 4 weeks  Jenetta Loges PA-C  02/24/2022, 9:25 AM Contact # [phone]

## 2022-02-24 NOTE — Progress Notes (Signed)
PROGRESS NOTE    Micheal Moore.  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  Subjective:  POD#1, became very orthostatic with therapy   132/65 (supine), 103/59 (sitting), 79/60 (standing), and when back to bed 137/81   Assessment & Plan:  Principal Problem:   Hip fracture ([place]) Active Problems:   History of DVT (deep vein thrombosis)   Hyperlipidemia   Type 2 diabetes mellitus with hyperlipidemia (HCC)   Tobacco abuse   Chronic diastolic CHF (congestive heart failure) (HCC)  Mechanical fall with right hip fracture -s/ p Intramedullary fixation, Right femur on 6/9 -post op wound care, pain management , weight bearing status, DVT prophylaxis  per ortho  Post op anemia Hgb baseline appear 12-13 Hgb today is 9.6 Repeat cbc in am, may need prbc transfusion  Will check fOBT  Orthostatic hypotension Ivf bolus 1l, then start ivf at 75cc/hr Repeat orthostatic vital signs in am  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 Alen Blew and dermatology Dr. Ronnald Ramp   Smoker  5 cigarretts a day   I  have Reviewed nursing notes, Vitals, pain scores, I/o's, Lab results and  imaging results since pt's last encounter, details please see discussion above  I ordered the following labs:  Unresulted Labs (From admission, onward)     Start     Ordered   03/02/22 0500  Creatinine, serum  (enoxaparin (LOVENOX)  CrCl >/= 30 mL/min  )  Weekly,   R     Comments: while on enoxaparin therapy.    02/23/22 1336   02/24/22 0500  CBC  Daily,   R     Comments: For 3 days.    02/23/22 1336   02/24/22 9169  Basic metabolic panel  Daily,   R     Comments: For 2 days .    02/23/22 1336             DVT prophylaxis: enoxaparin (LOVENOX) injection 40 mg Start: 02/24/22 0800 SCDs Start: 02/23/22 1337   Code Status:   Code Status: Full Code  Family Communication: family at bedside  Disposition:    Dispo: The patient is from: home , baseline independent              Anticipated d/c is to: [facility]              Anticipated d/c date is: 24-48hrs, need to repeat orthostatic vital signs, need to monitor hgb  Antimicrobials:   Anti-infectives (From admission, onward)    Start     Dose/Rate Route Frequency Ordered Stop  02/23/22 1700  ceFAZolin (ANCEF) IVPB 2g/100 mL premix        2 g 200 mL/hr over 30 Minutes Intravenous Every 6 hours 02/23/22 1336 02/24/22 0148   02/23/22 0811  ceFAZolin (ANCEF) 2-4 GM/100ML-% IVPB       Note to Pharmacy: Jasmine Pang K: cabinet override      02/23/22 0811 02/23/22 1133   02/23/22 0745  ceFAZolin (ANCEF) IVPB 2g/100 mL premix        2 g 200 mL/hr over 30 Minutes Intravenous On call to O.R. 02/23/22 0655 02/23/22 1107            Objective: Vitals:   02/24/22 0411 02/24/22 0831 02/24/22 0938 02/24/22 1340  BP: 100/60 (!) 106/57 (!) 123/57 122/67  Pulse: 100 96 (!) 104 98  Resp: '18  16 12  '$ Temp: 98.4 F (36.9 C)   98.5 F (36.9 C)  TempSrc: Oral   Oral  SpO2: 96%  99% 96%  Weight:      Height:        Intake/Output Summary (Last 24  hours) at 02/24/2022 1630 Last data filed at 02/24/2022 1214 Gross per 24 hour  Intake 2277.3 ml  Output 2925 ml  Net -647.7 ml   Filed Weights   02/22/22 1614 02/22/22 2203 02/23/22 0804  Weight: 95.3 kg 88.5 kg 88.5 kg    Examination:  General exam: alert, awake, communicative,calm, NAD Respiratory system: Clear to auscultation. Respiratory effort normal. Cardiovascular system:  RRR.  Gastrointestinal system: Abdomen is nondistended, soft and nontender.  Normal bowel sounds heard. Central nervous system: Alert and oriented. No focal neurological deficits. Extremities:  right hip post op changes  Skin: No rashes, lesions or ulcers Psychiatry: Judgement and insight appear normal. Mood & affect appropriate.     Data Reviewed: I have personally reviewed  labs and visualized  imaging studies since the last encounter and formulate the plan        Scheduled Meds:  docusate sodium  100 mg Oral BID   enoxaparin (LOVENOX) injection  40 mg Subcutaneous Q24H   feeding supplement  237 mL Oral BID BM   insulin aspart  0-9 Units Subcutaneous Q4H   multivitamin with minerals  1 tablet Oral Daily   nicotine  14 mg Transdermal Daily   senna  1 tablet Oral BID   Continuous Infusions:  sodium chloride 100 mL/hr at 02/23/22 1355   methocarbamol (ROBAXIN) IV     sodium chloride       LOS: 2 days     Florencia Reasons, MD PhD FACP [facility]  Available via Epic secure chat 7am-7pm for nonurgent issues Please page for urgent issues To page the attending provider between 7A-7P or the covering provider during after hours 7P-7A, please log into the web site [email or web address] and access using universal  password for that web site. If you do not have the password, please call the hospital operator.    02/24/2022, 4:30 PM

## 2022-02-24 NOTE — Evaluation (Addendum)
Occupational Therapy Evaluation Patient Details Name: Micheal Moore. MRN: [id number] DOB: Apr 24, 1951 Today's Date: 02/24/2022   History of Present Illness Pt s/p fall with with R hip fx and now s/p intramedullary fixation.  Pt with hx of DM, falls, syncope, and L great toe amputation   Clinical Impression   Patient is a 71 year old male who was admitted for above. Patient was living at home with wife with independence in ADLs. Patient was noted to have had a significant decline in the ability to engage in ADLs. Patients blood pressure was noted to drop with standing limiting session along with increased pain with movement.   Patient was noted to have decreased functional activity tolernace,increased pain, decreased endurance, decreased sitting balance, decreased standing balanced, decreased safety awareness, and decreased knowledge of AE/AD impacting participation in ADLs. Patient would continue to benefit from skilled OT services at this time while admitted and after d/c to address noted deficits in order to improve overall safety and independence in ADLs.        Recommendations for follow up therapy are one component of a multi-disciplinary discharge planning process, led by the attending physician.  Recommendations may be updated based on patient status, additional functional criteria and insurance authorization.   Follow Up Recommendations  Skilled nursing-short term rehab (<3 hours/day)    Assistance Recommended at Discharge Frequent or constant Supervision/Assistance  Patient can return home with the following Two people to help with walking and/or transfers;Two people to help with bathing/dressing/bathroom;Direct supervision/assist for medications management;Help with stairs or ramp for entrance;Assist for transportation;Direct supervision/assist for financial management;Assistance with cooking/housework    Functional Status Assessment  Patient has had a recent decline in  their functional status and demonstrates the ability to make significant improvements in function in a reasonable and predictable amount of time.  Equipment Recommendations  Other (comment) (defer to next venue)    Recommendations for Other Services       Precautions / Restrictions Precautions Precautions: Fall Precaution Comments: orthostatic Restrictions Weight Bearing Restrictions: No RLE Weight Bearing: Weight bearing as tolerated      Mobility Bed Mobility Overal bed mobility: Needs Assistance Bed Mobility: Sit to Supine, Supine to Sit     Supine to sit: Mod assist, +2 for physical assistance, +2 for safety/equipment Sit to supine: Mod assist, +2 for physical assistance, +2 for safety/equipment   General bed mobility comments: Increased time with cues for sequence and physical assist to manage LEs and to control trunk    Transfers                          Balance Overall balance assessment: Needs assistance Sitting-balance support: Feet supported, No upper extremity supported Sitting balance-[name]: Fair     Standing balance support: Bilateral upper extremity supported, Reliant on assistive device for balance Standing balance-[name]: Poor                             ADL either performed or assessed with clinical judgement   ADL Overall ADL's : Needs assistance/impaired Eating/Feeding: Set up;Sitting Eating/Feeding Details (indicate cue type and reason): in bed Grooming: Set up;Bed level   Upper Body Bathing: Set up;Bed level   Lower Body Bathing: Maximal assistance;Bed level   Upper Body Dressing : Set up;Bed level   Lower Body Dressing: Maximal assistance;Bed level   Toilet Transfer: +2 for physical assistance;+2 for safety/equipment Toilet  Transfer Details (indicate cue type and reason): patient was noted to have drop in BP from edge of bed 106/47 mmhg to  79/93mhg standing  per reading on automatic cuff in standing with  patient returned to bed at this time with transfers deferred. patients BP upon supine was 137/81 mmhg. Toileting- Clothing Manipulation and Hygiene: Bed level;Maximal assistance       Functional mobility during ADLs: +2 for physical assistance;+2 for safety/equipment       Vision Patient Visual Report: No change from baseline       Perception     Praxis      Pertinent Vitals/Pain Pain Assessment Pain Assessment: Faces Faces Pain Scale: Hurts whole lot Pain Location: R hip Pain Descriptors / Indicators: Aching, Grimacing, Sore Pain Intervention(s): Limited activity within patient's tolerance, Monitored during session, Premedicated before session, Repositioned     Hand Dominance Right   Extremity/Trunk Assessment Upper Extremity Assessment Upper Extremity Assessment: Overall WFL for tasks assessed   Lower Extremity Assessment Lower Extremity Assessment: Defer to PT evaluation   Cervical / Trunk Assessment Cervical / Trunk Assessment: Normal   Communication Communication Communication: No difficulties   Cognition Arousal/Alertness: Awake/alert Behavior During Therapy: WFL for tasks assessed/performed, Impulsive Overall Cognitive Status: Within Functional Limits for tasks assessed                                 General Comments: wife was also present during session      General Comments   Patient reported having neck pain when standing and sometimes in bed. Patient and wife were educated on proper body mechanics in bed with pillow placement education. Patient and patients wife verbalized understanding.     Exercises     Shoulder Instructions      Home Living Family/patient expects to be discharged to:: Private residence Living Arrangements: Spouse/significant other Available Help at Discharge: Family;Available PRN/intermittently;Available 24 hours/day Type of Home: House Home Access: Stairs to enter [employer] Steps: 2-3 steps to  porch and one into house Entrance Stairs-Rails: None Home Layout: One level     Bathroom Shower/Tub: TTeacher, early years/pre Standard     Home Equipment: None          Prior Functioning/Environment Prior Level of Function : Independent/Modified Independent                        OT Problem List: Decreased activity tolerance;Impaired balance (sitting and/or standing);Decreased safety awareness;Cardiopulmonary status limiting activity;Decreased knowledge of precautions;Decreased knowledge of use of DME or AE      OT Treatment/Interventions: Self-care/ADL training;Therapeutic exercise;Neuromuscular education;Energy conservation;DME and/or AE instruction;Therapeutic activities;Balance training;Patient/family education    OT Goals(Current goals can be found in the care plan section) Acute Rehab OT Goals Patient Stated Goal: to get stronger before going home OT Goal Formulation: With patient Time For Goal Achievement: 03/10/22 Potential to Achieve Goals: Good  OT Frequency: Min 2X/week    Co-evaluation PT/OT/SLP Co-Evaluation/Treatment: Yes Reason for Co-Treatment: For patient/therapist safety;To address functional/ADL transfers PT goals addressed during session: Mobility/safety with mobility OT goals addressed during session: ADL's and self-care      AM-PAC OT "6 Clicks" Daily Activity     Outcome Measure Help from another person eating meals?: A Little Help from another person taking care of personal grooming?: A Little Help from another person toileting, which includes using toliet, bedpan, or urinal?: Total Help  from another person bathing (including washing, rinsing, drying)?: A Lot Help from another person to put on and taking off regular upper body clothing?: A Little Help from another person to put on and taking off regular lower body clothing?: A Lot 6 Click Score: 14   End of Session Equipment Utilized During Treatment: Gait belt;Rolling walker  (2 wheels) Nurse Communication: Mobility status  Activity Tolerance: Patient limited by pain;Other (comment) (orthostatics) Patient left:    OT Visit Diagnosis: Unsteadiness on feet (R26.81);Other abnormalities of gait and mobility (R26.89);Muscle weakness (generalized) (M62.81);Pain Pain - Right/Left: Right Pain - part of body: Leg                Time: 1287-8676 OT Time Calculation (min): 19 min Charges:  OT General Charges $OT Visit: 1 Visit OT Evaluation $OT Eval Moderate Complexity: 1 Mod  [name] OTR/L, MS Acute Rehabilitation Department Office# [phone] Pager# [phone]   Marcellina Millin 02/24/2022, 3:54 PM

## 2022-02-24 NOTE — Evaluation (Signed)
Physical Therapy Evaluation Patient Details Name: Micheal Moore. MRN: [id number] DOB: 25-May-1951 Today's Date: 02/24/2022  History of Present Illness  Pt s/p fall with with R hip fx and now s/p intramedullary fixation.  Pt with hx of DM, falls, syncope, and L great toe amputation  Clinical Impression  Pt admitted as above and presenting with functional mobility limitations 2* decreased R LE strength/ROM, post op pain, balance deficits and premorbid deconditioning.  Pt hopes to progress to dc home with family assist.     Recommendations for follow up therapy are one component of a multi-disciplinary discharge planning process, led by the attending physician.  Recommendations may be updated based on patient status, additional functional criteria and insurance authorization.  Follow Up Recommendations Home health PT    Assistance Recommended at Discharge Frequent or constant Supervision/Assistance  Patient can return home with the following  A lot of help with walking and/or transfers;A lot of help with bathing/dressing/bathroom;Assistance with cooking/housework;Assist for transportation;Help with stairs or ramp for entrance    Equipment Recommendations Rolling walker (2 wheels)  Recommendations for Other Services       Functional Status Assessment Patient has had a recent decline in their functional status and demonstrates the ability to make significant improvements in function in a reasonable and predictable amount of time.     Precautions / Restrictions Precautions Precautions: Fall Restrictions Weight Bearing Restrictions: No RLE Weight Bearing: Weight bearing as tolerated      Mobility  Bed Mobility Overal bed mobility: Needs Assistance Bed Mobility: Sit to Supine, Supine to Sit     Supine to sit: Mod assist, +2 for physical assistance, +2 for safety/equipment Sit to supine: Mod assist, +2 for physical assistance, +2 for safety/equipment   General bed  mobility comments: Increased time with cues for sequence and physical assist to manage LEs and to control trunk    Transfers Overall transfer level: Needs assistance Equipment used: Rolling walker (2 wheels) Transfers: Sit to/from Stand, Bed to chair/wheelchair/BSC Sit to Stand: Mod assist, +2 physical assistance, +2 safety/equipment, From elevated surface   Step pivot transfers: Min assist, +2 physical assistance, +2 safety/equipment, From elevated surface       General transfer comment: cues for LE management and use of UEs to self assist;  Physical assist to bring wt up and fwd and to balance in initial standing; step-pvt bed to [facility]    Ambulation/Gait Ambulation/Gait assistance: Min assist, +2 physical assistance, +2 safety/equipment Gait Distance (Feet): 5 Feet Assistive device: Rolling walker (2 wheels) Gait Pattern/deviations: Step-to pattern, Decreased step length - right, Decreased step length - left, Decreased stance time - right, Shuffle, Trunk flexed Gait velocity: decr     General Gait Details: cues for posture, sequence, position from RW and basic safety awareness  Stairs            Wheelchair Mobility    Modified Rankin (Stroke Patients Only)       Balance Overall balance assessment: Needs assistance Sitting-balance support: Feet supported, No upper extremity supported Sitting balance-[name]: Fair     Standing balance support: Bilateral upper extremity supported Standing balance-[name]: Poor                               Pertinent Vitals/Pain Pain Assessment Pain Assessment: 0-10 Pain Score: 6  Pain Location: R hip Pain Descriptors / Indicators: Aching, Grimacing, Sore Pain Intervention(s): Limited activity within patient's tolerance, Monitored during  session, Patient requesting pain meds-RN notified    Home Living Family/patient expects to be discharged to:: Private residence Living Arrangements: Spouse/significant  other Available Help at Discharge: Family;Available PRN/intermittently;Available 24 hours/day Type of Home: House Home Access: Stairs to enter Entrance Stairs-Rails: None Entrance Stairs-Number of Steps: 2-3 steps to porch and one into house   Home Layout: One level Home Equipment: None      Prior Function Prior Level of Function : Independent/Modified Independent                     Hand Dominance        Extremity/Trunk Assessment   Upper Extremity Assessment Upper Extremity Assessment: Overall WFL for tasks assessed    Lower Extremity Assessment Lower Extremity Assessment: RLE deficits/detail    Cervical / Trunk Assessment Cervical / Trunk Assessment: Normal  Communication   Communication: No difficulties  Cognition Arousal/Alertness: Awake/alert Behavior During Therapy: WFL for tasks assessed/performed, Impulsive Overall Cognitive Status: Within Functional Limits for tasks assessed                                          General Comments      Exercises     Assessment/Plan    PT Assessment Patient needs continued PT services  PT Problem List Decreased strength;Decreased range of motion;Decreased activity tolerance;Decreased balance;Decreased mobility;Decreased knowledge of use of DME;Pain;Decreased safety awareness       PT Treatment Interventions DME instruction;Gait training;Stair training;Functional mobility training;Therapeutic activities;Therapeutic exercise;Patient/family education;Balance training    PT Goals (Current goals can be found in the Care Plan section)  Acute Rehab PT Goals Patient Stated Goal: Use the bathroom PT Goal Formulation: With patient Time For Goal Achievement: 03/10/22 Potential to Achieve Goals: Fair    Frequency Min 6X/week     Co-evaluation               AM-PAC PT "6 Clicks" Mobility  Outcome Measure Help needed turning from your back to your side while in a flat bed without using  bedrails?: A Lot Help needed moving from lying on your back to sitting on the side of a flat bed without using bedrails?: A Lot Help needed moving to and from a bed to a chair (including a wheelchair)?: A Lot Help needed standing up from a chair using your arms (e.g., wheelchair or bedside chair)?: A Lot Help needed to walk in hospital room?: Total Help needed climbing 3-5 steps with a railing? : Total 6 Click Score: 10    End of Session Equipment Utilized During Treatment: Gait belt Activity Tolerance: Patient limited by pain Patient left: in bed;with call bell/phone within reach;with nursing/sitter in room;with bed alarm set Nurse Communication: Mobility status PT Visit Diagnosis: Unsteadiness on feet (R26.81);Muscle weakness (generalized) (M62.81);Difficulty in walking, not elsewhere classified (R26.2);History of falling (Z91.81);Pain Pain - Right/Left: Right Pain - part of body: Hip    Time: 0825-0857 PT Time Calculation (min) (ACUTE ONLY): 32 min   Charges:   PT Evaluation $PT Eval Low Complexity: 1 Low PT Treatments $Therapeutic Activity: 8-22 mins        Debe Coder PT Acute Rehabilitation Services Pager [phone] Office [phone]   Jerica Creegan 02/24/2022, 10:52 AM

## 2022-02-24 NOTE — TOC Progression Note (Addendum)
Transition of Care (TOC) - Progression Note    Patient Details  Name: Micheal Moore. MRN: [id number] Date of Birth: Jan 27, 1951  Transition of Care [facility]) CM/SW Contact  Ross Ludwig, [place] Phone Number: 02/24/2022, 6:07 PM  Clinical Narrative:    CSW was informed that patient will need SNF placement for short term rehab.  CSW started bed search in [facility] [place] awaiting bed offers.  Patient will need insurance authorization before going to SNF.        Expected Discharge Plan and Services  SNF for short term rehab.                                               Social Determinants of Health (SDOH) Interventions    Readmission Risk Interventions     No data to display

## 2022-02-24 NOTE — NC FL2 (Signed)
[place] LEVEL OF CARE SCREENING TOOL     IDENTIFICATION  Patient Name: Micheal Moore. Birthdate: 23-Mar-1951 Sex: male Admission Date (Current Location): 02/22/2022  [facility] and [place] Number:  Herbalist and Address:  [facility],  [place], [place]      Provider Number: [id number]  Attending Physician Name and Address:  Florencia Reasons, MD  Relative Name and Phone Number:  [facility] Spouse [phone]  [phone]  Murray,Debbie Sister   [phone]    Current Level of Care: Hospital Recommended Level of Care: [place] Prior Approval Number:    Date Approved/Denied:   PASRR Number: [id number]  Discharge Plan: SNF    Current Diagnoses: Patient Active Problem List   Diagnosis Date Noted   Hip fracture ([place]) 02/22/2022   Hyperlipidemia 02/22/2022   Chronic diastolic CHF (congestive heart failure) ([place]) 02/22/2022   Left great toe amputee ([place]) 05/06/2020   Cellulitis in diabetic foot ([place]) 04/27/2020   Osteomyelitis of great toe of left foot ([place]) 04/27/2020   Type 2 diabetes mellitus with hyperlipidemia ([place]) 04/27/2020   History of DVT (deep vein thrombosis) 04/27/2020   Tobacco abuse 04/27/2020    Orientation RESPIRATION BLADDER Height & Weight     Self, Time, Situation, Place  Normal Continent Weight: 195 lb 1.7 oz (88.5 kg) Height:  '6\' 5"'$  (195.6 cm)  BEHAVIORAL SYMPTOMS/MOOD NEUROLOGICAL BOWEL NUTRITION STATUS      Continent Diet (Regular diet)  AMBULATORY STATUS COMMUNICATION OF NEEDS Skin   Limited Assist Verbally Surgical wounds                       Personal Care Assistance Level of Assistance  Bathing, Dressing, Feeding Bathing Assistance: Limited assistance Feeding assistance: Independent Dressing Assistance: Limited assistance     Functional Limitations Info  Sight, Speech, Hearing Sight Info: Adequate Hearing Info: Adequate Speech Info: Adequate     SPECIAL CARE FACTORS FREQUENCY  PT (By licensed PT), OT (By licensed OT)     PT Frequency: Minimum 5x a week OT Frequency: Minimum 5x a week            Contractures Contractures Info: Not present    Additional Factors Info  Code Status, Allergies, Insulin Sliding Scale Code Status Info: Full Code Allergies Info: NKA   Insulin Sliding Scale Info: insulin aspart (novoLOG) injection 0-9 Units, Every 4 hours Route: [place]       Current Medications (02/24/2022):  This is the current hospital active medication list Current Facility-Administered Medications  Medication Dose Route Frequency Provider Last Rate Last Admin   0.9 %  sodium chloride infusion   Intravenous Continuous Florencia Reasons, MD       docusate sodium (COLACE) capsule 100 mg  100 mg Oral BID Rod Can, MD   100 mg at 02/24/22 0910   enoxaparin (LOVENOX) injection 40 mg  40 mg Subcutaneous Q24H Swinteck, Aaron Edelman, MD   40 mg at 02/24/22 0909   feeding supplement (GLUCERNA SHAKE) (GLUCERNA SHAKE) liquid 237 mL  237 mL Oral TID BM Florencia Reasons, MD       HYDROcodone-acetaminophen (NORCO/VICODIN) 5-325 MG per tablet 1-2 tablet  1-2 tablet Oral Q6H PRN Rod Can, MD   1 tablet at 02/24/22 0911   insulin aspart (novoLOG) injection 0-9 Units  0-9 Units Subcutaneous Q4H Rod Can, MD   2 Units at 02/24/22 1745   menthol-cetylpyridinium (CEPACOL) lozenge 3 mg  1 lozenge Oral PRN Swinteck,  Aaron Edelman, MD       Or   phenol (CHLORASEPTIC) mouth spray 1 spray  1 spray Mouth/Throat PRN Swinteck, Aaron Edelman, MD       methocarbamol (ROBAXIN) tablet 500 mg  500 mg Oral Q6H PRN Rod Can, MD   500 mg at 02/24/22 1336   Or   methocarbamol (ROBAXIN) 500 mg in dextrose 5 % 50 mL IVPB  500 mg Intravenous Q6H PRN Swinteck, Aaron Edelman, MD       metoCLOPramide (REGLAN) tablet 5-10 mg  5-10 mg Oral Q8H PRN Swinteck, Aaron Edelman, MD       Or   metoCLOPramide (REGLAN) injection 5-10 mg  5-10 mg Intravenous Q8H PRN Swinteck, Aaron Edelman, MD       morphine (PF) 2  MG/ML injection 2 mg  2 mg Intravenous Q4H PRN Rod Can, MD   2 mg at 02/22/22 2136   multivitamin with minerals tablet 1 tablet  1 tablet Oral Daily Florencia Reasons, MD   1 tablet at 02/24/22 0910   nicotine (NICODERM CQ - dosed in mg/24 hours) patch 14 mg  14 mg Transdermal Daily Swinteck, Aaron Edelman, MD       ondansetron [facility]) tablet 4 mg  4 mg Oral Q6H PRN Swinteck, Aaron Edelman, MD       Or   ondansetron (ZOFRAN) injection 4 mg  4 mg Intravenous Q6H PRN Swinteck, Aaron Edelman, MD       polyethylene glycol (MIRALAX / GLYCOLAX) packet 17 g  17 g Oral Daily PRN Swinteck, Aaron Edelman, MD       polyethylene glycol (MIRALAX / GLYCOLAX) packet 17 g  17 g Oral Daily Florencia Reasons, MD   17 g at 02/24/22 1757   senna (SENOKOT) tablet 8.6 mg  1 tablet Oral BID Rod Can, MD         Discharge Medications: Please see discharge summary for a list of discharge medications.  Relevant Imaging Results:  Relevant Lab Results:   Additional Information SSN [id number]  Ross Ludwig, LCSW

## 2022-02-24 NOTE — Progress Notes (Signed)
Physical Therapy Treatment Patient Details Name: Micheal Moore. MRN: [id number] DOB: [birth year] Today's Date: 02/24/2022   History of Present Illness Pt s/p fall with with R hip fx and now s/p intramedullary fixation.  Pt with hx of DM, falls, syncope, and L great toe amputation    PT Comments    Pt continues cooperative and tolerated gentle therex program well before being assisted to EOB sitting and to standing but unable to attempt ambulation 2* increased pain and orthostatic BP.  BP supine 132/65; sit 103/59 and stand 79/60.  Pt continues to require significant assist of two for safe performance of all basic mobility tasks and would benefit from follow up rehab at SNF level to maximize IND and safety prior to dc home with limited assist.   Recommendations for follow up therapy are one component of a multi-disciplinary discharge planning process, led by the attending physician.  Recommendations may be updated based on patient status, additional functional criteria and insurance authorization.  Follow Up Recommendations  Skilled nursing-short term rehab (<3 hours/day)     Assistance Recommended at Discharge Frequent or constant Supervision/Assistance  Patient can return home with the following A lot of help with walking and/or transfers;A lot of help with bathing/dressing/bathroom;Assistance with cooking/housework;Assist for transportation;Help with stairs or ramp for entrance   Equipment Recommendations  Rolling walker (2 wheels)    Recommendations for Other Services       Precautions / Restrictions Precautions Precautions: Fall Precaution Comments: orthostatic Restrictions Weight Bearing Restrictions: No RLE Weight Bearing: Weight bearing as tolerated     Mobility  Bed Mobility Overal bed mobility: Needs Assistance Bed Mobility: Sit to Supine, Supine to Sit     Supine to sit: Mod assist, +2 for physical assistance, +2 for safety/equipment Sit to supine: Mod  assist, +2 for physical assistance, +2 for safety/equipment   General bed mobility comments: Increased time with cues for sequence and physical assist to manage LEs and to control trunk    Transfers Overall transfer level: Needs assistance Equipment used: Rolling walker (2 wheels) Transfers: Sit to/from Stand Sit to Stand: Mod assist, +2 physical assistance, +2 safety/equipment, From elevated surface   Step pivot transfers: Min assist, +2 physical assistance, +2 safety/equipment, From elevated surface       General transfer comment: cues for LE management and use of UEs to self assist;  Physical assist to bring wt up and fwd and to balance in initial standing;    Ambulation/Gait Ambulation/Gait assistance: Min assist, +2 physical assistance, +2 safety/equipment Gait Distance (Feet): 5 Feet Assistive device: Rolling walker (2 wheels) Gait Pattern/deviations: Step-to pattern, Decreased step length - right, Decreased step length - left, Decreased stance time - right, Shuffle, Trunk flexed Gait velocity: decr     General Gait Details: Pt tolerated one side step up side of bed only before needing to sit   Stairs             Wheelchair Mobility    Modified Rankin (Stroke Patients Only)       Balance Overall balance assessment: Needs assistance Sitting-balance support: Feet supported, No upper extremity supported Sitting balance-[name]: Fair     Standing balance support: Bilateral upper extremity supported, Reliant on assistive device for balance Standing balance-[name]: Poor                              Cognition Arousal/Alertness: Awake/alert Behavior During Therapy: WFL for tasks assessed/performed,  Impulsive Overall Cognitive Status: Within Functional Limits for tasks assessed                                 General Comments: wife was also present during session        Exercises General Exercises - Lower  Extremity Ankle Circles/Pumps: AROM, Both, 15 reps, Supine Quad Sets: AROM, Both, 10 reps, Supine Heel Slides: AAROM, Right, 15 reps, Supine Hip ABduction/ADduction: AAROM, Right, 15 reps, Supine    General Comments        Pertinent Vitals/Pain Pain Assessment Pain Assessment: Faces Pain Score: 6  Faces Pain Scale: Hurts whole lot Pain Location: R hip Pain Descriptors / Indicators: Aching, Grimacing, Sore Pain Intervention(s): Limited activity within patient's tolerance, Monitored during session, Premedicated before session, Ice applied    Home Living Family/patient expects to be discharged to:: Private residence Living Arrangements: Spouse/significant other Available Help at Discharge: Family;Available PRN/intermittently;Available 24 hours/day Type of Home: House Home Access: Stairs to enter Entrance Stairs-Rails: None Entrance Stairs-Number of Steps: 2-3 steps to porch and one into house   Home Layout: One level Home Equipment: None      Prior Function            PT Goals (current goals can now be found in the care plan section) Acute Rehab PT Goals PT Goal Formulation: With patient Time For Goal Achievement: 03/10/22 Potential to Achieve Goals: Fair Progress towards PT goals: Progressing toward goals    Frequency    Min 5X/week      PT Plan Discharge plan needs to be updated    Co-evaluation PT/OT/SLP Co-Evaluation/Treatment: Yes Reason for Co-Treatment: For patient/therapist safety PT goals addressed during session: Mobility/safety with mobility OT goals addressed during session: ADL's and self-care      AM-PAC PT "6 Clicks" Mobility   Outcome Measure  Help needed turning from your back to your side while in a flat bed without using bedrails?: A Lot Help needed moving from lying on your back to sitting on the side of a flat bed without using bedrails?: A Lot Help needed moving to and from a bed to a chair (including a wheelchair)?: A Lot Help  needed standing up from a chair using your arms (e.g., wheelchair or bedside chair)?: A Lot Help needed to walk in hospital room?: Total Help needed climbing 3-5 steps with a railing? : Total 6 Click Score: 10    End of Session Equipment Utilized During Treatment: Gait belt Activity Tolerance: Other (comment);Patient limited by pain (orthostatic) Patient left: in bed;with call bell/phone within reach;with bed alarm set;with family/visitor present Nurse Communication: Mobility status PT Visit Diagnosis: Unsteadiness on feet (R26.81);Muscle weakness (generalized) (M62.81);Difficulty in walking, not elsewhere classified (R26.2);History of falling (Z91.81);Pain Pain - Right/Left: Right Pain - part of body: Hip     Time: 9678-9381 PT Time Calculation (min) (ACUTE ONLY): 33 min  Charges:  $Therapeutic Exercise: 8-22 mins $Therapeutic Activity: 8-22 mins                     [place] Pager [phone] Office [phone]    [facility] 02/24/2022, 4:38 PM

## 2022-02-25 DIAGNOSIS — Z86718 Personal history of other venous thrombosis and embolism: Secondary | ICD-10-CM | POA: Diagnosis not present

## 2022-02-25 DIAGNOSIS — W19XXXA Unspecified fall, initial encounter: Secondary | ICD-10-CM

## 2022-02-25 DIAGNOSIS — S72001A Fracture of unspecified part of neck of right femur, initial encounter for closed fracture: Secondary | ICD-10-CM | POA: Diagnosis not present

## 2022-02-25 DIAGNOSIS — I5032 Chronic diastolic (congestive) heart failure: Secondary | ICD-10-CM | POA: Diagnosis not present

## 2022-02-25 DIAGNOSIS — I951 Orthostatic hypotension: Secondary | ICD-10-CM

## 2022-02-25 LAB — BASIC METABOLIC PANEL
Anion gap: 6 (ref 5–15)
BUN: 27 mg/dL — ABNORMAL HIGH (ref 8–23)
CO2: 27 mmol/L (ref 22–32)
Calcium: 8.9 mg/dL (ref 8.9–10.3)
Chloride: 105 mmol/L (ref 98–111)
Creatinine, Ser: 0.92 mg/dL (ref 0.61–1.24)
GFR, Estimated: >60 mL/min (ref >60)
Glucose, Bld: 158 mg/dL — ABNORMAL HIGH (ref 70–99)
Potassium: 4.6 mmol/L (ref 3.5–5.1)
Sodium: 138 mmol/L (ref 135–145)

## 2022-02-25 LAB — PROTIME-INR
INR: 1.1 (ref 0.8–1.2)
Prothrombin Time: 13.7 seconds (ref 11.4–15.2)

## 2022-02-25 LAB — CBC
HCT: 28.2 % — ABNORMAL LOW (ref 39.0–52.0)
Hemoglobin: 9.3 g/dL — ABNORMAL LOW (ref 13.0–17.0)
MCH: 32 pg (ref 26.0–34.0)
MCHC: 33 g/dL (ref 30.0–36.0)
MCV: 96.9 fL (ref 80.0–100.0)
Platelets: 163 10*3/uL (ref 150–400)
RBC: 2.91 MIL/uL — ABNORMAL LOW (ref 4.22–5.81)
RDW: 12.7 % (ref 11.5–15.5)
WBC: 6.6 10*3/uL (ref 4.0–10.5)
nRBC: 0 % (ref 0.0–0.2)

## 2022-02-25 LAB — GLUCOSE, CAPILLARY
Glucose-Capillary: 146 mg/dL — ABNORMAL HIGH (ref 70–99)
Glucose-Capillary: 195 mg/dL — ABNORMAL HIGH (ref 70–99)
Glucose-Capillary: 201 mg/dL — ABNORMAL HIGH (ref 70–99)
Glucose-Capillary: 209 mg/dL — ABNORMAL HIGH (ref 70–99)
Glucose-Capillary: 226 mg/dL — ABNORMAL HIGH (ref 70–99)
Glucose-Capillary: 264 mg/dL — ABNORMAL HIGH (ref 70–99)

## 2022-02-25 MED ORDER — SODIUM CHLORIDE 0.9 % IV BOLUS
1000.0000 mL | Freq: Once | INTRAVENOUS | Status: AC
  Administered 2022-02-25: 1000 mL via INTRAVENOUS

## 2022-02-25 MED ORDER — FLUTICASONE PROPIONATE 50 MCG/ACT NA SUSP
1.0000 | Freq: Every day | NASAL | Status: DC
  Administered 2022-02-25 – 2022-03-05 (×6): 1 via NASAL
  Filled 2022-02-25: qty 16

## 2022-02-25 MED ORDER — SODIUM CHLORIDE 0.9 % IV SOLN: INTRAVENOUS | Status: AC

## 2022-02-25 NOTE — Progress Notes (Signed)
Subjective: 2 Days Post-Op Procedure(s) (LRB): INTRAMEDULLARY (IM) NAIL INTERTROCHANTRIC, RIGHT (Right) Patient reports pain as moderate.    Objective: Vital signs in last 24 hours: Temp:  [98.2 F (36.8 C)-98.5 F (36.9 C)] 98.2 F (36.8 C) (06/11 0412) Pulse Rate:  [96-104] 98 (06/11 0412) Resp:  [12-16] 14 (06/11 0412) BP: (106-152)/(57-87) 142/73 (06/11 0412) SpO2:  [96 %-99 %] 96 % (06/11 0412)  Intake/Output from previous day: 06/10 0701 - 06/11 0700 In: 2370.4 [P.O.:1420; I.V.:787.5; IV Piggyback:162.9] Out: 2263 [Urine:3475] Intake/Output this shift: Total I/O In: 240 [P.O.:240] Out: 450 [Urine:450]  Recent Labs    02/22/22 1649 02/23/22 1353 02/24/22 0346 02/25/22 0307  HGB 12.3* 11.4* 9.6* 9.3*   Recent Labs    02/24/22 0346 02/25/22 0307  WBC 6.8 6.6  RBC 2.99* 2.91*  HCT 29.2* 28.2*  PLT 174 163   Recent Labs    02/24/22 0346 02/25/22 0307  NA 136 138  K 4.3 4.6  CL 103 105  CO2 26 27  BUN 24* 27*  CREATININE 1.03 0.92  GLUCOSE 198* 158*  CALCIUM 8.9 8.9   Recent Labs    02/25/22 0307  INR 1.1    Neurologically intact ABD soft Neurovascular intact Sensation intact distally Intact pulses distally Dorsiflexion/Plantar flexion intact Incision: dressing C/D/I and scant drainage No cellulitis present Compartment soft No sign of DVT   Assessment/Plan: 2 Days Post-Op Procedure(s) (LRB): INTRAMEDULLARY (IM) NAIL INTERTROCHANTRIC, RIGHT (Right) Advance diet Up with therapy D/C IV fluids WBAT with PT Lovenox for DVT ppx inpatient, ASA on D/C x 4 weeks  Ellison Hughs M Montserrat Shek 02/25/2022, 7:55 AM

## 2022-02-25 NOTE — Progress Notes (Signed)
Physical Therapy Treatment Patient Details Name: Micheal Moore. MRN: [id number] DOB: [birth year] Today's Date: 02/25/2022   History of Present Illness Pt s/p fall with with R hip fx and now s/p intramedullary fixation.  Pt with hx of DM, falls, syncope, and L great toe amputation    PT Comments    Pt cooperative this am but progressing slowly 2* pain and continued orthostatic hypotension - BP supine 146/71, sit 95/58, pt stood but unable to tolerate standing long enough to get accurate BP.  RN aware.   Recommendations for follow up therapy are one component of a multi-disciplinary discharge planning process, led by the attending physician.  Recommendations may be updated based on patient status, additional functional criteria and insurance authorization.  Follow Up Recommendations  Skilled nursing-short term rehab (<3 hours/day)     Assistance Recommended at Discharge Frequent or constant Supervision/Assistance  Patient can return home with the following A lot of help with walking and/or transfers;A lot of help with bathing/dressing/bathroom;Assistance with cooking/housework;Assist for transportation;Help with stairs or ramp for entrance   Equipment Recommendations  Rolling walker (2 wheels)    Recommendations for Other Services       Precautions / Restrictions Precautions Precautions: Fall Precaution Comments: orthostatic Restrictions Weight Bearing Restrictions: No RLE Weight Bearing: Weight bearing as tolerated     Mobility  Bed Mobility Overal bed mobility: Needs Assistance Bed Mobility: Supine to Sit, Sit to Supine     Supine to sit: Min assist, +2 for physical assistance, +2 for safety/equipment Sit to supine: Min assist, Mod assist, +2 for physical assistance, +2 for safety/equipment   General bed mobility comments: Increased time with cues for sequence and physical assist to manage LEs and to control trunk    Transfers Overall transfer level: Needs  assistance Equipment used: Rolling walker (2 wheels) Transfers: Sit to/from Stand Sit to Stand: Min assist, Mod assist, +2 physical assistance, +2 safety/equipment, From elevated surface                Ambulation/Gait Ambulation/Gait assistance: Min assist, +2 physical assistance, +2 safety/equipment Gait Distance (Feet): 1 Feet Assistive device: Rolling walker (2 wheels) Gait Pattern/deviations: Step-to pattern, Decreased step length - right, Decreased step length - left, Decreased stance time - right, Shuffle, Trunk flexed Gait velocity: decr     General Gait Details: Pt tolerated one side step up side of bed only before needing to sit   Stairs             Wheelchair Mobility    Modified Rankin (Stroke Patients Only)       Balance Overall balance assessment: Needs assistance Sitting-balance support: Feet supported, No upper extremity supported Sitting balance-[name]: Fair     Standing balance support: Bilateral upper extremity supported, Reliant on assistive device for balance Standing balance-[name]: Poor                              Cognition Arousal/Alertness: Awake/alert Behavior During Therapy: WFL for tasks assessed/performed, Impulsive Overall Cognitive Status: Within Functional Limits for tasks assessed                                          Exercises General Exercises - Lower Extremity Ankle Circles/Pumps: AROM, Both, 15 reps, Supine Quad Sets: AROM, Both, 10 reps, Supine Heel Slides: AAROM, Right, 15  reps, Supine Hip ABduction/ADduction: AAROM, Right, 15 reps, Supine    General Comments        Pertinent Vitals/Pain Pain Assessment Pain Assessment: Faces Faces Pain Scale: Hurts even more Pain Location: R hip Pain Descriptors / Indicators: Aching, Grimacing, Sore Pain Intervention(s): Limited activity within patient's tolerance, Monitored during session, Premedicated before session, Ice applied     Home Living                          Prior Function            PT Goals (current goals can now be found in the care plan section) Acute Rehab PT Goals Patient Stated Goal: "get the h___ out of here, I can't stand the food" PT Goal Formulation: With patient Time For Goal Achievement: 03/10/22 Potential to Achieve Goals: Fair Progress towards PT goals: Progressing toward goals    Frequency    Min 5X/week      PT Plan Current plan remains appropriate    Co-evaluation              AM-PAC PT "6 Clicks" Mobility   Outcome Measure  Help needed turning from your back to your side while in a flat bed without using bedrails?: A Lot Help needed moving from lying on your back to sitting on the side of a flat bed without using bedrails?: A Lot Help needed moving to and from a bed to a chair (including a wheelchair)?: A Lot Help needed standing up from a chair using your arms (e.g., wheelchair or bedside chair)?: A Lot Help needed to walk in hospital room?: Total Help needed climbing 3-5 steps with a railing? : Total 6 Click Score: 10    End of Session Equipment Utilized During Treatment: Gait belt Activity Tolerance: Other (comment) (orthostatic) Patient left: in bed;with call bell/phone within reach;with bed alarm set;with family/visitor present Nurse Communication: Mobility status PT Visit Diagnosis: Unsteadiness on feet (R26.81);Muscle weakness (generalized) (M62.81);Difficulty in walking, not elsewhere classified (R26.2);History of falling (Z91.81);Pain Pain - Right/Left: Right Pain - part of body: Hip     Time: 6144-3154 PT Time Calculation (min) (ACUTE ONLY): 32 min  Charges:  $Therapeutic Exercise: 8-22 mins $Therapeutic Activity: 8-22 mins                     Debe Coder PT Acute Rehabilitation Services Pager [phone] Office [phone]    Micheal Moore 02/25/2022, 10:27 AM

## 2022-02-25 NOTE — TOC Progression Note (Signed)
Transition of Care (TOC) - Progression Note    Patient Details  Name: Micheal Moore. MRN: [id number] Date of Birth: 12-03-1950  Transition of Care [facility]) CM/SW Contact  Ross Ludwig, [place] Phone Number: 02/25/2022, 2:59 PM  Clinical Narrative:     CSW spoke to patient's wife, and discussed bed offers.  Patient's wife chose [facility] [place] started insurance authorization reference number [id number].  Per Juliann Pulse at [facility], patient can be accepted once insurance authorization has been received, and patient is medically ready for discharge.  CSW to continue to follow patient's progress throughout discharge planning.        Expected Discharge Plan and Services                                                 Social Determinants of Health (SDOH) Interventions    Readmission Risk Interventions     No data to display

## 2022-02-25 NOTE — Plan of Care (Signed)
  Problem: Education: Goal: Knowledge of General Education information will improve Description: Including pain rating scale, medication(s)/side effects and non-pharmacologic comfort measures Outcome: Progressing   Problem: Health Behavior/Discharge Planning: Goal: Ability to manage health-related needs will improve Outcome: Progressing   Problem: Clinical Measurements: Goal: Ability to maintain clinical measurements within normal limits will improve Outcome: Progressing   Problem: Activity: Goal: Risk for activity intolerance will decrease Outcome: Progressing   Problem: Nutrition: Goal: Adequate nutrition will be maintained Outcome: Progressing   Problem: Elimination: Goal: Will not experience complications related to bowel motility Outcome: Progressing   Problem: Pain Managment: Goal: General experience of comfort will improve Outcome: Progressing   

## 2022-02-25 NOTE — TOC Initial Note (Signed)
Transition of Care (TOC) - Initial/Assessment Note    Patient Details  Name: Micheal Moore. MRN: [id number] Date of Birth: 1951/05/30  Transition of Care [facility]) CM/SW Contact:    Ross Ludwig, LCSW Phone Number: 02/25/2022, 3:10 PM  Clinical Narrative:                  Patient is a 71 year old male who is married and lives with his wife.  Patient is alert and oriented x4, patient and wife feel he needs some short term rehab before returning back home.  CSW spoke to patient's wife to discuss SNF placement and the process that needs to be done in order to get him into rehab.  Per patient's wife he has not been to rehab before, CSW explained how insurance pays for the stay and the process to get approval and placed.  Patient's wife has chosen [facility] for rehab.  Insurance authorization has been started.  CSW to continue to follow patient's progress throughout discharge planning.  Expected Discharge Plan: Skilled Nursing Facility Barriers to Discharge: Insurance Authorization, Continued Medical Work up   Patient Goals and CMS Choice Patient states their goals for this hospitalization and ongoing recovery are:: To go to SNF for short term rehab, then return back home. CMS Medicare.gov Compare Post Acute Care list provided to:: Patient Represenative (must comment) Choice offered to / list presented to : Spouse  Expected Discharge Plan and Services Expected Discharge Plan: [place] Choice: [place] arrangements for the past 2 months: Single Family Home                                      Prior Living Arrangements/Services Living arrangements for the past 2 months: Single Family Home Lives with:: Spouse Patient language and need for interpreter reviewed:: Yes Do you feel safe going back to the place where you live?: No   Patient and family feel that he needs SNF for rehab, before returning  back home.  Need for Family Participation in Patient Care: Yes (Comment) Care giver support system in place?: No (comment)   Criminal Activity/Legal Involvement Pertinent to Current Situation/Hospitalization: No - Comment as needed  Activities of Daily Living Home Assistive Devices/Equipment: Eyeglasses ADL Screening (condition at time of admission) Patient's cognitive ability adequate to safely complete daily activities?: Yes Is the patient deaf or have difficulty hearing?: No Does the patient have difficulty seeing, even when wearing glasses/contacts?: No Does the patient have difficulty concentrating, remembering, or making decisions?: No Patient able to express need for assistance with ADLs?: Yes Does the patient have difficulty dressing or bathing?: No Independently performs ADLs?: Yes (appropriate for developmental age) Does the patient have difficulty walking or climbing stairs?: No Weakness of Legs: Both Weakness of Arms/Hands: Both  Permission Sought/Granted Permission sought to share information with : Case Manager, Customer service manager, Family Supports Permission granted to share information with : Yes, Verbal Permission Granted  Share Information with NAME: [place] [id number]  Permission granted to share info w AGENCY: SNF admissions        Emotional Assessment Appearance:: Appears stated age Attitude/Demeanor/Rapport: Engaged Affect (typically observed): Accepting, Calm, Stable Orientation: : Oriented to Self, Oriented to Place, Oriented to  Time, Oriented to Situation Alcohol / Substance Use: Tobacco Use Psych Involvement: No (  comment)  Admission diagnosis:  Hip fracture ([place]) [S72.009A] Fall, initial encounter [W19.XXXA] Closed fracture of right hip, initial encounter ([place]) [S72.001A] Patient Active Problem List   Diagnosis Date Noted   Hip fracture ([place]) 02/22/2022   Hyperlipidemia 02/22/2022   Chronic diastolic CHF  (congestive heart failure) ([place]) 02/22/2022   Left great toe amputee ([place]) 05/06/2020   Cellulitis in diabetic foot ([place]) 04/27/2020   Osteomyelitis of great toe of left foot ([place]) 04/27/2020   Type 2 diabetes mellitus with hyperlipidemia ([place]) 04/27/2020   History of DVT (deep vein thrombosis) 04/27/2020   Tobacco abuse 04/27/2020   PCP:  [name], MD Pharmacy:   [place] ([place]), [place] - [address] [place] ([place])  [address] Phone: [phone] Fax: [phone]     Social Determinants of Health (SDOH) Interventions    Readmission Risk Interventions     No data to display

## 2022-02-25 NOTE — Progress Notes (Signed)
PROGRESS NOTE    Micheal Moore.  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  Subjective:  POD#2,  No acute complaints laying in bed but remain significantly orthostatic with therapy   Assessment & Plan:  Principal Problem:   Hip fracture (HCC) Active Problems:   History of DVT (deep vein thrombosis)   Hyperlipidemia   Type 2 diabetes mellitus with hyperlipidemia (HCC)   Tobacco abuse   Chronic diastolic CHF (congestive heart failure) (HCC)  Mechanical fall with right hip fracture -s/ p Intramedullary fixation, Right femur on 6/9 -post op wound care, pain management , weight bearing status, DVT prophylaxis  per ortho  Post op anemia Hgb baseline appear 12-13 Hgb today is 9.3 Repeat cbc in am, may need prbc transfusion  FOBT ordered, pending collection, does not appear to have significant external bleed   Orthostatic hypotension Remain orthostatic after Ivf bolus 1l on 6/10 and  ivf at 75cc/hr, will give another bolus of 1l NS , increase ivf to 100cc/hr Check am cortisol  Repeat orthostatic vital signs in am  H/o 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 Alen Blew and dermatology Dr. Ronnald Ramp   Smoker  5 cigarretts a day   I have Reviewed nursing notes, Vitals, pain scores, I/o's, Lab results and  imaging results since pt's last encounter, details please see discussion above  I ordered the following labs:  Unresulted Labs (From admission, onward)     Start     Ordered   03/02/22 0500  Creatinine, serum  (enoxaparin (LOVENOX)  CrCl >/= 30 mL/min  )  Weekly,   R     Comments: while on enoxaparin therapy.    02/23/22 1336   02/26/22 0500  Cortisol  Tomorrow morning,   R       Question:  Specimen collection method  Answer:  Lab=Lab collect   02/25/22 1212   02/24/22 0500  CBC  Daily,   R     Comments: For 3 days.    02/23/22 1336   02/24/22 1027  Basic metabolic panel  Daily,   R     Comments: For 2 days .    02/23/22 1336   Unscheduled  Occult blood card to lab, stool RN will collect  As needed,   R     Question:  Specimen to be collected by:  Answer:  RN will collect   02/24/22 1639             DVT prophylaxis: enoxaparin (LOVENOX) injection 40 mg Start: 02/24/22 0800 SCDs Start: 02/23/22 1337  Code Status:   Code Status: Full Code  Family Communication: family at bedside on 6/10 Disposition:    Dispo: The patient is from: home , baseline independent              Anticipated d/c is to: SNF              Anticipated d/c date is: 24-48hrs, need to repeat orthostatic vital signs, need to monitor hgb  Antimicrobials:   Anti-infectives (From admission, onward)    Start     Dose/Rate Route Frequency Ordered Stop   02/23/22 1700  ceFAZolin (ANCEF) IVPB 2g/100 mL premix        2 g 200 mL/hr over 30 Minutes Intravenous Every 6 hours 02/23/22 1336 02/24/22 0148   02/23/22 0811  ceFAZolin (ANCEF) 2-4 GM/100ML-% IVPB       Note to Pharmacy: Jasmine Pang K: cabinet override      02/23/22 0811 02/23/22 1133   02/23/22 0745  ceFAZolin (ANCEF) IVPB  2g/100 mL premix        2 g 200 mL/hr over 30 Minutes Intravenous On call to O.R. 02/23/22 0655 02/23/22 1107            Objective: Vitals:   02/24/22 1340 02/24/22 2156 02/25/22 0412 02/25/22 0830  BP: 122/67 (!) 152/87 (!) 142/73 131/71  Pulse: 98 (!) 102 98 (!) 106  Resp: '12 14 14 15  '$ Temp: 98.5 F (36.9 C) 98.2 F (36.8 C) 98.2 F (36.8 C) 98.3 F (36.8 C)  TempSrc: Oral Oral Oral Oral  SpO2: 96% 98% 96% 96%  Weight:      Height:        Intake/Output Summary (Last 24 hours) at 02/25/2022 1213 Last data filed at 02/25/2022 1153 Gross per 24 hour  Intake 2486.63 ml  Output 5305 ml  Net -2818.37 ml   Filed Weights   02/22/22 1614 02/22/22 2203 02/23/22 0804  Weight: 95.3 kg 88.5 kg 88.5 kg    Examination:  General exam: alert, awake, communicative,calm, NAD Respiratory system: Clear to auscultation. Respiratory effort normal. Cardiovascular system:  RRR.  Gastrointestinal system: Abdomen is nondistended, soft and nontender.  Normal bowel sounds heard. Central nervous system: Alert and oriented. No focal neurological deficits. Extremities:  right hip post op changes  Skin: No rashes, lesions or ulcers Psychiatry: Judgement and insight appear normal. Mood & affect appropriate.     Data Reviewed: I have personally reviewed  labs and visualized  imaging studies since the last encounter and formulate the plan        Scheduled Meds:  docusate sodium  100 mg Oral BID   enoxaparin (LOVENOX) injection  40 mg Subcutaneous Q24H   feeding supplement (GLUCERNA SHAKE)  237 mL Oral TID BM   insulin aspart  0-9 Units Subcutaneous Q4H   multivitamin with minerals  1 tablet Oral Daily   nicotine  14 mg Transdermal Daily   polyethylene glycol  17 g Oral Daily   senna  1 tablet Oral BID   Continuous Infusions:  sodium chloride 75 mL/hr at 02/25/22 0909   methocarbamol (ROBAXIN) IV       LOS: 3 days     Florencia Reasons, MD PhD FACP [facility]  Available  via Epic secure chat 7am-7pm for nonurgent issues Please page for urgent issues To page the attending provider between 7A-7P or the covering provider during after hours 7P-7A, please log into the web site [email or web address] and access using universal [place] password for that  web site. If you do not have the password, please call the hospital operator.    02/25/2022, 12:13 PM

## 2022-02-26 ENCOUNTER — Encounter (HOSPITAL_COMMUNITY): Payer: Self-pay | Admitting: Orthopedic Surgery

## 2022-02-26 DIAGNOSIS — S72001A Fracture of unspecified part of neck of right femur, initial encounter for closed fracture: Secondary | ICD-10-CM | POA: Diagnosis not present

## 2022-02-26 LAB — BASIC METABOLIC PANEL
Anion gap: 7 (ref 5–15)
BUN: 25 mg/dL — ABNORMAL HIGH (ref 8–23)
CO2: 25 mmol/L (ref 22–32)
Calcium: 8.7 mg/dL — ABNORMAL LOW (ref 8.9–10.3)
Chloride: 104 mmol/L (ref 98–111)
Creatinine, Ser: 0.92 mg/dL (ref 0.61–1.24)
GFR, Estimated: >60 mL/min (ref >60)
Glucose, Bld: 243 mg/dL — ABNORMAL HIGH (ref 70–99)
Potassium: 4.7 mmol/L (ref 3.5–5.1)
Sodium: 136 mmol/L (ref 135–145)

## 2022-02-26 LAB — CORTISOL: Cortisol, Plasma: 13.2 ug/dL

## 2022-02-26 LAB — CBC
HCT: 28.3 % — ABNORMAL LOW (ref 39.0–52.0)
Hemoglobin: 9.3 g/dL — ABNORMAL LOW (ref 13.0–17.0)
MCH: 31.7 pg (ref 26.0–34.0)
MCHC: 32.9 g/dL (ref 30.0–36.0)
MCV: 96.6 fL (ref 80.0–100.0)
Platelets: 188 10*3/uL (ref 150–400)
RBC: 2.93 MIL/uL — ABNORMAL LOW (ref 4.22–5.81)
RDW: 12.7 % (ref 11.5–15.5)
WBC: 6.7 10*3/uL (ref 4.0–10.5)
nRBC: 0 % (ref 0.0–0.2)

## 2022-02-26 LAB — GLUCOSE, CAPILLARY
Glucose-Capillary: 190 mg/dL — ABNORMAL HIGH (ref 70–99)
Glucose-Capillary: 235 mg/dL — ABNORMAL HIGH (ref 70–99)
Glucose-Capillary: 261 mg/dL — ABNORMAL HIGH (ref 70–99)
Glucose-Capillary: 280 mg/dL — ABNORMAL HIGH (ref 70–99)
Glucose-Capillary: 229 mg/dL — ABNORMAL HIGH (ref 70–99)
Glucose-Capillary: 146 mg/dL — ABNORMAL HIGH (ref 70–99)

## 2022-02-26 MED ORDER — METFORMIN HCL 500 MG PO TABS
1000.0000 mg | ORAL_TABLET | Freq: Two times a day (BID) | ORAL | Status: DC
  Administered 2022-02-26 – 2022-02-27 (×4): 1000 mg via ORAL
  Filled 2022-02-26 (×4): qty 2

## 2022-02-26 MED ORDER — SODIUM CHLORIDE 0.9 % IV SOLN: INTRAVENOUS | Status: DC

## 2022-02-26 MED ORDER — ROSUVASTATIN CALCIUM 5 MG PO TABS
5.0000 mg | ORAL_TABLET | ORAL | Status: DC
  Administered 2022-02-27 – 2022-03-03 (×2): 5 mg via ORAL
  Filled 2022-02-26 (×2): qty 1

## 2022-02-26 MED ORDER — GLIMEPIRIDE 2 MG PO TABS
2.0000 mg | ORAL_TABLET | Freq: Every day | ORAL | Status: DC
  Administered 2022-02-26 – 2022-03-05 (×8): 2 mg via ORAL
  Filled 2022-02-26 (×8): qty 1

## 2022-02-26 NOTE — Plan of Care (Signed)
Problem: Clinical Measurements: Goal: Ability to maintain clinical measurements within normal limits will improve 02/26/2022 1222 by Anvi Mangal, Lunette Stands, RN Outcome: Not Progressing  Problem: Pain Managment: Goal: General experience of comfort will improve Outcome: Progressing   Problem: Safety: Goal: Ability to remain free from injury will improve Outcome: [place], RN 02/26/22 12:23 PM

## 2022-02-26 NOTE — Progress Notes (Signed)
PROGRESS NOTE  Micheal Moore.  DOB: 18-Feb-1951  PCP: [name], MD YTK:[id number]  DOA: 02/22/2022  LOS: 4 days  Hospital Day: 5  Brief narrative: Micheal Moore. is a 71 y.o. 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 Patient presented to the ED on 6/8 after mechanical fall on concrete walkway.  Right hip x-ray showed mildly displaced and angulated comminuted right intertrochanteric acute fracture. 6/9, underwent right femur intramedullary fixation by Dr. Lyla Glassing. 6/11, noted to be orthostatic  Subjective: Patient was seen and examined this morning. Lying on bed.  Not in distress.  No new symptoms.  Later this morning, orthostatic vital signs were obtained again.  Patient is severely orthostatic with drop in systolic blood pressure from 130s to 70s on sitting up, he was too weak to stand up.  Principal Problem:   Hip fracture ([place]) Active Problems:   History of DVT (deep vein thrombosis)   Hyperlipidemia   Type 2 diabetes mellitus with hyperlipidemia (HCC)   Tobacco abuse   Chronic diastolic CHF (congestive heart failure) (HCC)   Fall   Orthostatic hypotension    Assessment and plan:  Mechanical fall with right hip fracture -s/ p Intramedullary fixation, Right femur on 6/9 -post op wound care, pain management , weight bearing status, DVT prophylaxis  per ortho.  Noted that patient is currently on Lovenox subcu.  Orthopedic recommends aspirin for 4 weeks at discharge.  Orthostatic hypotension History of chronic diastolic CHF -Patient noted to be orthostatic since yesterday 611.  Currently on normal saline at 100 mill per hour.  I will continue the same for next 24 hours and measure vital signs again tomorrow. -A.m. cortisol level normal today. -Patient has history of chronic diastolic CHF and is not on CHF meds. -If  continues to remain orthostatic tomorrow, I will start him on midodrine.    Post op anemia -Baseline hemoglobin between 12-13.  Postop, hemoglobin has been running low and is stable between 9 and 10.  No other source of bleeding. Recent Labs    02/22/22 1649 02/23/22 1353 02/24/22 0346 02/25/22 0307 02/26/22 0416  HGB 12.3* 11.4* 9.6* 9.3* 9.3*  MCV 96.1 98.6 97.7 96.9 96.6    Type 2 diabetes mellitus hyperglycemia -A1c 7.2 on 02/22/2022 -Home meds include glimepiride and metformin -Currently oral meds on hold.  Blood sugar level running elevated.  Resume glimepiride and metformin today. -Continue sliding scale insulin with Accu-Cheks. 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 Alen Blew and dermatology Dr. Ronnald Ramp    Smoker -5 cigarretts a day -Counseled to quit.   Goals of care   Code Status: Full Code    Mobility: Encourage ambulation when able to  Skin assessment:     Nutritional status:  Body mass index is 23.14 kg/m.  Nutrition Problem: Increased nutrient needs Etiology: hip fracture, post-op healing Signs/Symptoms: estimated needs     Diet:  Diet Order  Diet Carb Modified Fluid consistency: Thin; Room service appropriate? Yes  Diet effective now                   DVT prophylaxis:  enoxaparin (LOVENOX) injection 40 mg Start: 02/24/22 0800 SCDs Start: 02/23/22 1337   Antimicrobials: None Fluid: 100 mill per hour NS Consultants: None Family Communication: None at bedside  Status is: Inpatient  Continue  in-hospital care because: Remains orthostatic Level of care: Med-Surg   Dispo: The patient is from: Home              Anticipated d/c is to: SNF in 1 to 2 days              Patient currently is not medically stable to d/c.   Difficult to place patient No     Infusions:   sodium chloride 100 mL/hr at 02/26/22 7169   methocarbamol (ROBAXIN) IV      Scheduled Meds:  docusate sodium  100 mg Oral BID   enoxaparin (LOVENOX) injection  40 mg Subcutaneous Q24H   feeding supplement (GLUCERNA SHAKE)  237 mL Oral TID BM   fluticasone  1 spray Each Nare Daily   glimepiride  2 mg Oral Q breakfast   insulin aspart  0-9 Units Subcutaneous Q4H   metFORMIN  1,000 mg Oral BID WC   multivitamin with minerals  1 tablet Oral Daily   nicotine  14 mg Transdermal Daily   polyethylene glycol  17 g Oral Daily   [START ON 02/27/2022] rosuvastatin  5 mg Oral Once per day on Tue Sat   senna  1 tablet Oral BID    PRN meds: HYDROcodone-acetaminophen, menthol-cetylpyridinium **OR** phenol, methocarbamol **OR** methocarbamol (ROBAXIN) IV, metoCLOPramide **OR** metoCLOPramide (REGLAN) injection, morphine injection, ondansetron **OR** ondansetron (ZOFRAN) IV, polyethylene glycol   Antimicrobials: Anti-infectives (From admission, onward)    Start     Dose/Rate Route Frequency Ordered Stop   02/23/22 1700  ceFAZolin (ANCEF) IVPB 2g/100 mL premix        2 g 200 mL/hr over 30 Minutes Intravenous Every 6 hours 02/23/22 1336 02/24/22 0148   02/23/22 0811  ceFAZolin (ANCEF) 2-4 GM/100ML-% IVPB       Note to Pharmacy: Jasmine Pang K: cabinet override      02/23/22 0811 02/23/22 1133   02/23/22 0745  ceFAZolin (ANCEF) IVPB 2g/100 mL premix        2 g 200 mL/hr over 30 Minutes Intravenous On call to O.R. 02/23/22 0655 02/23/22 1107       Objective: Vitals:   02/26/22 1040 02/26/22 1050  BP: (!) 141/77 (!) 73/53  Pulse: (!) 106 81  Resp: 18   Temp: 98.4 F (36.9 C)   SpO2: 99% 99%    Intake/Output  Summary (Last 24 hours) at 02/26/2022 1130 Last data filed at 02/26/2022 1036 Gross per 24 hour  Intake 4130.92 ml  Output 5805 ml  Net -1674.08 ml   Filed Weights   02/22/22 1614 02/22/22 2203 02/23/22 0804  Weight: 95.3 kg 88.5 kg 88.5 kg   Weight change:  Body mass index is 23.14 kg/m.   Physical Exam: General exam: Pleasant, elderly male.  Not in physical distress at rest Skin: No rashes, lesions or ulcers. HEENT: Atraumatic, normocephalic, no obvious bleeding Lungs: Clear to auscultation bilaterally CVS: Regular rate and rhythm, no murmur GI/Abd soft, nontender, nondistended, bowel sound present CNS: Alert, awake, oriented x3 Psychiatry: Mood appropriate Extremities: No pedal edema, no calf tenderness  Data Review:  I have personally reviewed the laboratory data and studies available.  F/u labs ordered Unresulted Labs (From admission, onward)     Start     Ordered   03/02/22 0500  Creatinine, serum  (enoxaparin (LOVENOX)  CrCl >/= 30 mL/min  )  Weekly,   R     Comments: while on enoxaparin therapy.    02/23/22 1336   02/24/22 0500  CBC  Daily,   R     Comments: For 3 days.    02/23/22 1336   02/24/22 4854  Basic metabolic panel  Daily,   R     Comments: For 2 days .    02/23/22 1336   Unscheduled  Occult blood card to lab, stool RN will collect  As needed,   R     Question:  Specimen to be collected by:  Answer:  RN will collect   02/24/22 1639   Unscheduled  Occult blood card to lab, stool  As needed,   R      02/25/22 1215            Signed, Terrilee Croak, MD [facility] 02/26/2022

## 2022-02-26 NOTE — Progress Notes (Signed)
Physical Therapy Treatment Patient Details Name: Micheal Moore. MRN: [id number] DOB: 1951/01/04 Today's Date: 02/26/2022   History of Present Illness Pt s/p fall with with R hip fx and now s/p intramedullary fixation.  Pt with hx of DM, falls, syncope, and L great toe amputation    PT Comments    Pt required +2 max assist for supine to sit. In sitting he reported significant dizziness. BP supine 119/75, sitting 91/49. He was unable to tolerate sitting for more than ~2 minutes 2* dizziness. Pt performed RLE strengthening exercises with assistance.     Recommendations for follow up therapy are one component of a multi-disciplinary discharge planning process, led by the attending physician.  Recommendations may be updated based on patient status, additional functional criteria and insurance authorization.  Follow Up Recommendations  Skilled nursing-short term rehab (<3 hours/day)     Assistance Recommended at Discharge Frequent or constant Supervision/Assistance  Patient can return home with the following A lot of help with walking and/or transfers;A lot of help with bathing/dressing/bathroom;Assistance with cooking/housework;Assist for transportation;Help with stairs or ramp for entrance   Equipment Recommendations  Rolling walker (2 wheels)    Recommendations for Other Services       Precautions / Restrictions Precautions Precautions: Fall Precaution Comments: orthostatic Restrictions Weight Bearing Restrictions: No RLE Weight Bearing: Weight bearing as tolerated     Mobility  Bed Mobility Overal bed mobility: Needs Assistance Bed Mobility: Supine to Sit, Sit to Supine     Supine to sit: +2 for physical assistance, +2 for safety/equipment, Max assist Sit to supine: Max assist, +2 for physical assistance, +2 for safety/equipment   General bed mobility comments: assist to advance RLE and to raise trunk; pt dizzy in sitting. BP supine 119/75, sitting 91/49. Unable  to stand due to dizziness in sitting.    Transfers                        Ambulation/Gait                   Stairs             Wheelchair Mobility    Modified Rankin (Stroke Patients Only)       Balance Overall balance assessment: Needs assistance Sitting-balance support: Feet supported, No upper extremity supported Sitting balance-[name]: Fair     Standing balance support: Bilateral upper extremity supported, Reliant on assistive device for balance Standing balance-[name]: Poor                              Cognition Arousal/Alertness: Awake/alert Behavior During Therapy: WFL for tasks assessed/performed, Impulsive Overall Cognitive Status: Within Functional Limits for tasks assessed                                          Exercises General Exercises - Lower Extremity Ankle Circles/Pumps: AROM, Both, 15 reps, Supine Short Arc Quad: AAROM, Right, 10 reps, Supine Heel Slides: AAROM, Right, Supine, 10 reps Hip ABduction/ADduction: AAROM, Right, Supine, 10 reps    General Comments        Pertinent Vitals/Pain Pain Assessment Pain Assessment: Faces Faces Pain Scale: Hurts even more Pain Location: R hip with activity Pain Descriptors / Indicators: Aching, Grimacing, Sore Pain Intervention(s): Limited activity within patient's tolerance, Monitored during session,  Patient requesting pain meds-RN notified, Repositioned    Home Living                          Prior Function            PT Goals (current goals can now be found in the care plan section) Acute Rehab PT Goals Patient Stated Goal: "I don't do anything" when asked what activities he hopes to resume upon recovery PT Goal Formulation: With patient Time For Goal Achievement: 03/10/22 Potential to Achieve Goals: Fair Progress towards PT goals: Progressing toward goals    Frequency    Min 5X/week      PT Plan Current plan  remains appropriate    Co-evaluation              AM-PAC PT "6 Clicks" Mobility   Outcome Measure  Help needed turning from your back to your side while in a flat bed without using bedrails?: A Lot Help needed moving from lying on your back to sitting on the side of a flat bed without using bedrails?: Total Help needed moving to and from a bed to a chair (including a wheelchair)?: Total Help needed standing up from a chair using your arms (e.g., wheelchair or bedside chair)?: Total Help needed to walk in hospital room?: Total Help needed climbing 3-5 steps with a railing? : Total 6 Click Score: 7    End of Session Equipment Utilized During Treatment: Gait belt Activity Tolerance: Treatment limited secondary to medical complications (Comment) (orthostatic) Patient left: in bed;with call bell/phone within reach;with bed alarm set Nurse Communication: Mobility status PT Visit Diagnosis: Unsteadiness on feet (R26.81);Muscle weakness (generalized) (M62.81);Difficulty in walking, not elsewhere classified (R26.2);History of falling (Z91.81);Pain Pain - Right/Left: Right Pain - part of body: Hip     Time: 6222-9798 PT Time Calculation (min) (ACUTE ONLY): 17 min  Charges:  $Therapeutic Activity: 8-22 mins                     Blondell Reveal Kistler PT 02/26/2022  Acute Rehabilitation Services Pager [phone] Office [phone]

## 2022-02-27 DIAGNOSIS — S72001A Fracture of unspecified part of neck of right femur, initial encounter for closed fracture: Secondary | ICD-10-CM | POA: Diagnosis not present

## 2022-02-27 LAB — BASIC METABOLIC PANEL
Anion gap: 5 (ref 5–15)
BUN: 27 mg/dL — ABNORMAL HIGH (ref 8–23)
CO2: 26 mmol/L (ref 22–32)
Calcium: 8.9 mg/dL (ref 8.9–10.3)
Chloride: 106 mmol/L (ref 98–111)
Creatinine, Ser: 0.74 mg/dL (ref 0.61–1.24)
GFR, Estimated: >60 mL/min (ref >60)
Glucose, Bld: 176 mg/dL — ABNORMAL HIGH (ref 70–99)
Potassium: 4.7 mmol/L (ref 3.5–5.1)
Sodium: 137 mmol/L (ref 135–145)

## 2022-02-27 LAB — CBC
HCT: 28.7 % — ABNORMAL LOW (ref 39.0–52.0)
Hemoglobin: 9.2 g/dL — ABNORMAL LOW (ref 13.0–17.0)
MCH: 31.8 pg (ref 26.0–34.0)
MCHC: 32.1 g/dL (ref 30.0–36.0)
MCV: 99.3 fL (ref 80.0–100.0)
Platelets: 205 10*3/uL (ref 150–400)
RBC: 2.89 MIL/uL — ABNORMAL LOW (ref 4.22–5.81)
RDW: 12.8 % (ref 11.5–15.5)
WBC: 7 10*3/uL (ref 4.0–10.5)
nRBC: 0 % (ref 0.0–0.2)

## 2022-02-27 LAB — GLUCOSE, CAPILLARY
Glucose-Capillary: 117 mg/dL — ABNORMAL HIGH (ref 70–99)
Glucose-Capillary: 131 mg/dL — ABNORMAL HIGH (ref 70–99)
Glucose-Capillary: 141 mg/dL — ABNORMAL HIGH (ref 70–99)
Glucose-Capillary: 151 mg/dL — ABNORMAL HIGH (ref 70–99)
Glucose-Capillary: 153 mg/dL — ABNORMAL HIGH (ref 70–99)
Glucose-Capillary: 153 mg/dL — ABNORMAL HIGH (ref 70–99)
Glucose-Capillary: 178 mg/dL — ABNORMAL HIGH (ref 70–99)

## 2022-02-27 MED ORDER — HYDROCODONE-ACETAMINOPHEN 10-325 MG PO TABS: 0.5000 | ORAL_TABLET | ORAL | 0 refills | Status: AC | PRN

## 2022-02-27 MED ORDER — HYDROCODONE-ACETAMINOPHEN 10-325 MG PO TABS: 0.5000 | ORAL_TABLET | ORAL | 0 refills | Status: DC | PRN

## 2022-02-27 MED ORDER — ASPIRIN 81 MG PO CHEW: 81.0000 mg | CHEWABLE_TABLET | Freq: Two times a day (BID) | ORAL | 0 refills | Status: DC

## 2022-02-27 MED ORDER — ASPIRIN 81 MG PO CHEW: 81.0000 mg | CHEWABLE_TABLET | Freq: Two times a day (BID) | ORAL | 0 refills | Status: AC

## 2022-02-27 MED ORDER — MIDODRINE HCL 5 MG PO TABS
2.5000 mg | ORAL_TABLET | Freq: Three times a day (TID) | ORAL | Status: DC
  Administered 2022-02-27 – 2022-02-28 (×4): 2.5 mg via ORAL
  Filled 2022-02-27 (×4): qty 1

## 2022-02-27 NOTE — TOC Progression Note (Signed)
Transition of Care (TOC) - Progression Note    Patient Details  Name: Micheal Moore. MRN: [id number] Date of Birth: 1951/04/08  Transition of [facility]) CM/SW Contact  Lennart Pall, LCSW Phone Number: 02/27/2022, 1:51 PM  Clinical Narrative:    Continue to await insurance authorization for SNF bed at [facility].   Expected Discharge Plan: Skilled Nursing Facility Barriers to Discharge: Ship broker, Continued Medical Work up  Expected Discharge Plan and Services Expected Discharge Plan: [place] Choice: [place] arrangements for the past 2 months: Single Family Home                                       Social Determinants of Health (SDOH) Interventions    Readmission Risk Interventions     No data to display

## 2022-02-27 NOTE — Progress Notes (Signed)
PROGRESS NOTE  [name].  DOB: 07-07-1951  PCP: [name], MD DEY:[id number]  DOA: 02/22/2022  LOS: 5 days  Hospital Day: 6  Brief narrative: Praveen Coia. is a 71 y.o. 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 Patient presented to the ED on 6/8 after mechanical fall on concrete walkway.  Right hip x-ray showed mildly displaced and angulated comminuted right intertrochanteric acute fracture. 6/9, underwent right femur intramedullary fixation by Dr. Lyla Glassing. 6/11, noted to be orthostatic  Subjective: Patient was seen and examined this morning. Lying on bed.  Not in distress.  Pending orthostatic vital signs today.  Principal Problem:   Hip fracture ([place]) Active Problems:   History of DVT (deep vein thrombosis)   Hyperlipidemia   Type 2 diabetes mellitus with hyperlipidemia (HCC)   Tobacco abuse   Chronic diastolic CHF (congestive heart failure) (HCC)   Fall   Orthostatic hypotension    Assessment and plan:  Mechanical fall with right hip fracture -s/ p Intramedullary fixation, Right femur on 6/9 -post op wound care, pain management , weight bearing status, DVT prophylaxis  per ortho.  Noted that patient is currently on Lovenox subcu.  Orthopedic recommends aspirin for 4 weeks at discharge.  Orthostatic hypotension History of chronic diastolic CHF -Since 3/14, patient has remained orthostatic.  Currently on normal saline at 100 mill per hour.  Reassess orthostatic vital signs today.start on midodrine 2.5 mg 3 times daily. -A.m. cortisol level normal . -Patient has history of chronic diastolic CHF and is not on CHF meds.  Post op anemia -Baseline hemoglobin between 12-13.  Postop, hemoglobin has been running low and is stable between 9 and 10.  No other source of bleeding. Recent Labs    02/23/22 1353  02/24/22 0346 02/25/22 0307 02/26/22 0416 02/27/22 0417  HGB 11.4* 9.6* 9.3* 9.3* 9.2*  MCV 98.6 97.7 96.9 96.6 99.3    Type 2 diabetes mellitus hyperglycemia -A1c 7.2 on 02/22/2022 -Home meds include glimepiride and metformin -Currently continued on glimepiride and metformin.  Also on sliding scale insulin with Accu-Cheks. 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 Alen Blew and dermatology Dr. Ronnald Ramp    Smoker -5 cigarretts a day -Counseled to quit.   Goals of care   Code Status: Full Code    Mobility: Encourage ambulation when able to  Skin assessment:     Nutritional status:  Body mass index is 23.14 kg/m.  Nutrition Problem: Increased nutrient needs Etiology: hip fracture, post-op healing Signs/Symptoms: estimated needs     Diet:  Diet Order             Diet Carb Modified Fluid consistency: Thin; Room service appropriate? Yes  Diet effective now                   DVT prophylaxis:  enoxaparin (LOVENOX) injection 40 mg Start: 02/24/22 0800 SCDs Start: 02/23/22 1337   Antimicrobials: None Fluid: 100 mill  per hour NS Consultants: None Family Communication: None at bedside  Status is: Inpatient  Continue in-hospital care because: Remains orthostatic Level of care: Med-Surg   Dispo: The patient is from: Home              Anticipated d/c is to: SNF in 1 to 2 days              Patient currently is not medically stable to d/c.   Difficult to place patient No     Infusions:   sodium chloride 100 mL/hr at 02/27/22 1252    methocarbamol (ROBAXIN) IV      Scheduled Meds:  docusate sodium  100 mg Oral BID   enoxaparin (LOVENOX) injection  40 mg Subcutaneous Q24H   feeding supplement (GLUCERNA SHAKE)  237 mL Oral TID BM   fluticasone  1 spray Each Nare Daily   glimepiride  2 mg Oral Q breakfast   insulin aspart  0-9 Units Subcutaneous Q4H   metFORMIN  1,000 mg Oral BID WC   midodrine  2.5 mg Oral TID WC   multivitamin with minerals  1 tablet Oral Daily   nicotine  14 mg Transdermal Daily   polyethylene glycol  17 g Oral Daily   rosuvastatin  5 mg Oral Once per day on Tue Sat   senna  1 tablet Oral BID    PRN meds: HYDROcodone-acetaminophen, menthol-cetylpyridinium **OR** phenol, methocarbamol **OR** methocarbamol (ROBAXIN) IV, metoCLOPramide **OR** metoCLOPramide (REGLAN) injection, morphine injection, ondansetron **OR** ondansetron (ZOFRAN) IV, polyethylene glycol   Antimicrobials: Anti-infectives (From admission, onward)    Start     Dose/Rate Route Frequency Ordered Stop   02/23/22 1700  ceFAZolin (ANCEF) IVPB 2g/100 mL premix        2 g 200 mL/hr over 30 Minutes Intravenous Every 6 hours 02/23/22 1336 02/24/22 0148   02/23/22 0811  ceFAZolin (ANCEF) 2-4 GM/100ML-% IVPB       Note to Pharmacy: Jasmine Pang K: cabinet override      02/23/22 0811 02/23/22 1133   02/23/22 0745  ceFAZolin (ANCEF) IVPB 2g/100 mL premix        2 g 200 mL/hr over 30 Minutes Intravenous On call to O.R. 02/23/22 0655 02/23/22 1107       Objective: Vitals:   02/26/22 2031 02/27/22 0445  BP: (!) 124/57 (!) 141/79  Pulse: 93 98  Resp: 15 18  Temp: 98.5 F (36.9 C) 98.5 F (36.9 C)  SpO2: 100% 98%    Intake/Output Summary (Last 24 hours) at 02/27/2022 1302 Last data filed at 02/27/2022 1253 Gross per 24 hour  Intake 1504.21 ml  Output 4300 ml  Net -2795.79 ml   Filed Weights   02/22/22 1614 02/22/22 2203 02/23/22 0804  Weight: 95.3 kg 88.5 kg 88.5 kg   Weight change:  Body mass index is 23.14 kg/m.    Physical Exam: General exam: Pleasant, elderly male.  Not in physical distress at rest Skin: No rashes, lesions or ulcers. HEENT: Atraumatic, normocephalic, no obvious bleeding Lungs: Clear to auscultation bilaterally CVS: Regular rate and rhythm, no murmur GI/Abd soft, nontender, nondistended, bowel sound present CNS: Alert, awake, oriented x3 Psychiatry: Mood appropriate Extremities: No pedal edema, no calf tenderness  Data Review: I have personally reviewed the laboratory data and studies available.  F/u labs ordered Unresulted Labs (From admission, onward)     Start     Ordered   03/02/22 0500  Creatinine, serum  (enoxaparin (LOVENOX)  CrCl >/= 30 mL/min  )  Weekly,   R     Comments: while on enoxaparin therapy.    02/23/22 1336   Unscheduled  Occult blood card to lab, stool RN will collect  As needed,   R     Question:  Specimen to be collected by:  Answer:  RN will collect   02/24/22 1639   Unscheduled  Occult blood card to lab, stool  As needed,   R      02/25/22 1215            Signed, Terrilee Croak, MD [facility] 02/27/2022

## 2022-02-27 NOTE — Progress Notes (Signed)
Orthostatic vitals taken, patient could not tolerate standing. While sitting up, patient stated he was dizzy and needed to lay back down. MD made aware.

## 2022-02-27 NOTE — Care Management Important Message (Signed)
Important Message  Patient Details IM Letter given to the Patient. Name: Micheal Moore. MRN: [id number] Date of Birth: Jan 09, 1951   Medicare Important Message Given:  Yes     Kerin Salen 02/27/2022, 11:30 AM

## 2022-02-27 NOTE — Progress Notes (Signed)
Occupational Therapy Treatment Patient Details Name: Micheal Moore. MRN: [id number] DOB: 1950/10/16 Today's Date: 02/27/2022   History of present illness Pt s/p fall with with R hip fx and now s/p intramedullary fixation.  Pt with hx of DM, falls, syncope, and L great toe amputation   OT comments  Patient was extremely limited by pain and dizziness on this date. Patient was mod A for supine to sit on edge of bed with increased time and cues for sequencing. Patient upon sitting EOB was limited with dizziness and declined to attempt to stand. Patient's discharge plan remains appropriate at this time. OT will continue to follow acutely.    Blood pressures:   Supine: 137/77 mmhg HR 107  Sitting EOB: 91/51 incresed dizziness returned to supine.    Recommendations for follow up therapy are one component of a multi-disciplinary discharge planning process, led by the attending physician.  Recommendations may be updated based on patient status, additional functional criteria and insurance authorization.    Follow Up Recommendations  Skilled nursing-short term rehab (<3 hours/day)    Assistance Recommended at Discharge Frequent or constant Supervision/Assistance  Patient can return home with the following  Two people to help with walking and/or transfers;Two people to help with bathing/dressing/bathroom;Direct supervision/assist for medications management;Help with stairs or ramp for entrance;Assist for transportation;Direct supervision/assist for financial management;Assistance with cooking/housework   Equipment Recommendations  Other (comment) (defer to next venue)    Recommendations for Other Services      Precautions / Restrictions Precautions Precautions: Fall Precaution Comments: orthostatic Restrictions Weight Bearing Restrictions: Yes RLE Weight Bearing: Weight bearing as tolerated       Mobility Bed Mobility Overal bed mobility: Needs Assistance Bed Mobility:  Supine to Sit, Sit to Supine     Supine to sit: Mod assist, HOB elevated Sit to supine: Max assist   General bed mobility comments: patient needed physical assist to get BLE to edge of bed and trunk into midline with increased time    Transfers                         Balance Overall balance assessment: Needs assistance Sitting-balance support: Feet supported, No upper extremity supported Sitting balance-[name]: Fair                                     ADL either performed or assessed with clinical judgement   ADL Overall ADL's : Needs assistance/impaired                                       General ADL Comments: patient reported having increased pain in bilateral knees and hip with all movement. patient was mod A for supine to sit on edge of bed with increased time. patient was noted to have increased dizziness limiting ability to participate further in session.    Extremity/Trunk Assessment              Vision       Perception     Praxis      Cognition Arousal/Alertness: Awake/alert Behavior During Therapy: WFL for tasks assessed/performed, Impulsive Overall Cognitive Status: Within Functional Limits for tasks assessed  Exercises      Shoulder Instructions       General Comments      Pertinent Vitals/ Pain       Pain Assessment Pain Assessment: Faces Faces Pain Scale: Hurts even more Pain Location: bilateral knees on EOB Pain Descriptors / Indicators: Aching, Grimacing, Sore Pain Intervention(s): Limited activity within patient's tolerance, Monitored during session, Repositioned  Home Living                                          Prior Functioning/Environment              Frequency  Min 2X/week        Progress Toward Goals  OT Goals(current goals can now be found in the care plan section)  Progress towards OT  goals: Not progressing toward goals - comment (orthostatics and pain limiting patient)     Plan Discharge plan remains appropriate    Co-evaluation                 AM-PAC OT "6 Clicks" Daily Activity     Outcome Measure   Help from another person eating meals?: A Little Help from another person taking care of personal grooming?: A Little Help from another person toileting, which includes using toliet, bedpan, or urinal?: Total Help from another person bathing (including washing, rinsing, drying)?: A Lot Help from another person to put on and taking off regular upper body clothing?: A Little Help from another person to put on and taking off regular lower body clothing?: A Lot 6 Click Score: 14    End of Session    OT Visit Diagnosis: Unsteadiness on feet (R26.81);Other abnormalities of gait and mobility (R26.89);Muscle weakness (generalized) (M62.81);Pain Pain - Right/Left: Right Pain - part of body: Leg   Activity Tolerance Other (comment);Patient limited by pain (dizziness)   Patient Left in bed;with call bell/phone within reach   Nurse Communication Mobility status        Time: 7782-4235 OT Time Calculation (min): 11 min  Charges: OT General Charges $OT Visit: 1 Visit OT Treatments $Therapeutic Activity: 8-22 mins  [name] OTR/L, MS Acute Rehabilitation Department Office# [phone] Pager# [phone]   Marcellina Millin 02/27/2022, 2:48 PM

## 2022-02-27 NOTE — TOC Progression Note (Signed)
Transition of Care (TOC) - Progression Note    Patient Details  Name: Micheal Moore. MRN: [id number] Date of Birth: 1951/09/10  Transition of [facility]) CM/SW Contact  Lennart Pall, LCSW Phone Number: 02/27/2022, 3:35 PM  Clinical Narrative:    Have spoken with pt and wife this afternoon after being alerted by RN that they are requesting a change in SNF bed choice.  Wife insistent that they would now prefer pt to go to [facility] instead  of [facility].  Have alerted facilities and [facility] is able to offer a bed and will contact insurance to change facility info - at this time, insurance SNF auth still in pending status.   Expected Discharge Plan: Skilled Nursing Facility Barriers to Discharge: Ship broker, Continued Medical Work up  Expected Discharge Plan and Services Expected Discharge Plan: [place] Choice: [place] arrangements for the past 2 months: Single Family Home                                       Social Determinants of Health (SDOH) Interventions    Readmission Risk Interventions     No data to display

## 2022-02-27 NOTE — Progress Notes (Signed)
    Subjective:  Patient reports pain as mild to moderate.  Denies N/V/CP/SOB/Abd pain. Patient reports that he is still having some pain in the leg but it is improving. He states he hasn't done much with therapy yet. Denies tingling and numbness in LE bilaterally.   Objective:   VITALS:   Vitals:   02/26/22 1050 02/26/22 1346 02/26/22 2031 02/27/22 0445  BP: (!) 73/53 122/71 (!) 124/57 (!) 141/79  Pulse: 81 (!) 101 93 98  Resp:  $Remo'20 15 18  'EJuUq$ Temp:  98.6 F (37 C) 98.5 F (36.9 C) 98.5 F (36.9 C)  TempSrc:  Oral Oral Oral  SpO2: 99% 99% 100% 98%  Weight:      Height:        Patient lying in bed. NAD.  Neurologically intact ABD soft Neurovascular intact Sensation intact distally Intact pulses distally Dorsiflexion/Plantar flexion intact Incision: dressing C/D/I No cellulitis present Compartment soft   Lab Results  Component Value Date   WBC 7.0 02/27/2022   HGB 9.2 (L) 02/27/2022   HCT 28.7 (L) 02/27/2022   MCV 99.3 02/27/2022   PLT 205 02/27/2022   BMET    Component Value Date/Time   NA 137 02/27/2022 0417   NA 137 12/04/2021 1429   K 4.7 02/27/2022 0417   CL 106 02/27/2022 0417   CO2 26 02/27/2022 0417   GLUCOSE 176 (H) 02/27/2022 0417   BUN 27 (H) 02/27/2022 0417   BUN 17 12/04/2021 1429   CREATININE 0.74 02/27/2022 0417   CALCIUM 8.9 02/27/2022 0417   EGFR 79 12/04/2021 1429   GFRNONAA >60 02/27/2022 0417     Assessment/Plan: 4 Days Post-Op   Principal Problem:   Hip fracture ([place]) Active Problems:   Type 2 diabetes mellitus with hyperlipidemia (HCC)   History of DVT (deep vein thrombosis)   Tobacco abuse   Hyperlipidemia   Chronic diastolic CHF (congestive heart failure) (HCC)   Fall   Orthostatic hypotension   WBAT with walker DVT ppx: Lovenox during hospital stay, aspirin $RemoveBefor'81mg'zjkFeNokWjXy$  BID at discharge., SCDs, TEDS PO pain control PT/OT: Patient has had orthostatic hypotension with sitting/ambulation. Continue to work withPT and WBAT w/  walker as orthostatic hypotension allows.  Dispo: Patient under hospitalist care. Continue PT. Pain medication and DVT ppx printed in chart.    Charlott Rakes, PA-C 02/27/2022, 8:19 AM   [facility]  [place] [address], [place], [place] [address] Phone: [phone] [email or web address] Facebook  [employer]

## 2022-02-28 DIAGNOSIS — S72001A Fracture of unspecified part of neck of right femur, initial encounter for closed fracture: Secondary | ICD-10-CM | POA: Diagnosis not present

## 2022-02-28 LAB — GLUCOSE, CAPILLARY
Glucose-Capillary: 133 mg/dL — ABNORMAL HIGH (ref 70–99)
Glucose-Capillary: 164 mg/dL — ABNORMAL HIGH (ref 70–99)
Glucose-Capillary: 165 mg/dL — ABNORMAL HIGH (ref 70–99)
Glucose-Capillary: 202 mg/dL — ABNORMAL HIGH (ref 70–99)
Glucose-Capillary: 208 mg/dL — ABNORMAL HIGH (ref 70–99)
Glucose-Capillary: 221 mg/dL — ABNORMAL HIGH (ref 70–99)

## 2022-02-28 MED ORDER — INSULIN ASPART 100 UNIT/ML IJ SOLN
0.0000 [IU] | Freq: Three times a day (TID) | INTRAMUSCULAR | Status: DC
  Administered 2022-02-28: 5 [IU] via SUBCUTANEOUS
  Administered 2022-02-28: 2 [IU] via SUBCUTANEOUS
  Administered 2022-03-01: 8 [IU] via SUBCUTANEOUS
  Administered 2022-03-01 – 2022-03-02 (×4): 5 [IU] via SUBCUTANEOUS
  Administered 2022-03-02: 11 [IU] via SUBCUTANEOUS
  Administered 2022-03-03: 3 [IU] via SUBCUTANEOUS
  Administered 2022-03-03: 5 [IU] via SUBCUTANEOUS
  Administered 2022-03-03: 8 [IU] via SUBCUTANEOUS
  Administered 2022-03-04 (×2): 3 [IU] via SUBCUTANEOUS
  Administered 2022-03-04 – 2022-03-05 (×3): 5 [IU] via SUBCUTANEOUS

## 2022-02-28 MED ORDER — SODIUM CHLORIDE 0.9 % IV BOLUS
1000.0000 mL | INTRAVENOUS | Status: DC
Start: 2022-02-28 — End: 2022-02-28

## 2022-02-28 MED ORDER — INSULIN ASPART 100 UNIT/ML IJ SOLN
0.0000 [IU] | Freq: Every day | INTRAMUSCULAR | Status: DC
  Administered 2022-02-28: 2 [IU] via SUBCUTANEOUS
  Administered 2022-03-02: 3 [IU] via SUBCUTANEOUS
  Administered 2022-03-03: 2 [IU] via SUBCUTANEOUS
  Administered 2022-03-04: 3 [IU] via SUBCUTANEOUS

## 2022-02-28 MED ORDER — SENNOSIDES-DOCUSATE SODIUM 8.6-50 MG PO TABS
1.0000 | ORAL_TABLET | Freq: Two times a day (BID) | ORAL | Status: DC
  Administered 2022-02-28 – 2022-03-05 (×8): 1 via ORAL
  Filled 2022-02-28 (×9): qty 1

## 2022-02-28 MED ORDER — LACTATED RINGERS IV BOLUS
1000.0000 mL | INTRAVENOUS | Status: AC
  Administered 2022-02-28 (×2): 1000 mL via INTRAVENOUS

## 2022-02-28 MED ORDER — MIDODRINE HCL 5 MG PO TABS
5.0000 mg | ORAL_TABLET | Freq: Three times a day (TID) | ORAL | Status: DC
  Administered 2022-02-28 – 2022-03-05 (×15): 5 mg via ORAL
  Filled 2022-02-28 (×15): qty 1

## 2022-02-28 NOTE — Progress Notes (Signed)
  Progress Note Patient: Micheal Moore. ZDG:[id number] DOB: Dec 14, 1950 DOA: 02/22/2022  DOS: the patient was seen and examined on 02/28/2022  Brief hospital course: Past medical history of type II DM, HTN, HLD, HFpEF, DVT on aspirin, recurrent falls, right arm melanoma.  Presents to the hospital with complaints of "mechanical fall found to have right intertrochanteric fracture of the hip.  Underwent intramedullary fixation of the right femur on 6/9 by Dr. Lyla Glassing. Continues to remain orthostatic  postoperatively. Assessment and Plan: Right femur intertrochanteric fracture, closed. SP intramedullary nail fixation on 6/9. Postop wound care, pain management, DVT prophylaxis and weightbearing status per orthopedics. Currently on Lovenox, on discharge aspirin for 4 weeks.  Postop orthostasis hypotension. Blood pressure dropped significantly and the patient is not able to stand for full 3 minutes due to ongoing dizziness. No focal deficit at the time of my evaluation. We will perform further work-up due to persistent orthostasis despite aggressive IV hydration. Patient appears to be urinating excessively and actually is -6 L for the hospital stay therefore concern for ADH issues.  Postop acute blood loss anemia, expected. Baseline hemoglobin around 12-13. For now monitor.  Type 2 diabetes mellitus, uncontrolled with hyperglycemia without long-term insulin use. On metformin and glimepiride at home.  Currently on sliding scale insulin.  Monitor.  HLD. Continue statin.  Melanoma of the right arm. Follows up with Dr. Kerin Perna and the pathology.  Active smoker. Counseled to quit. Monitor.  Subjective: Continues to remain orthostasis with hypotension.  No nausea no vomiting.  Pain well controlled.  Reports dizziness.  Physical Exam: Vitals:   02/27/22 2221 02/28/22 0607 02/28/22 1332 02/28/22 1335  BP: 138/73 132/82 123/72 99/60  Pulse: 85 100 100 82  Resp:      Temp: 97.7 F  (36.5 C) 98.9 F (37.2 C)    TempSrc: Oral Oral    SpO2: 100% 99% 97% 100%  Weight:      Height:       General: Appear in mild distress; no visible Abnormal Neck Mass Or lumps, Conjunctiva normal Cardiovascular: S1 and S2 Present, no Murmur, Respiratory: good respiratory effort, Bilateral Air entry present and CTA, no Crackles, no wheezes Abdomen: Bowel Sound present, Non tender Extremities: no Pedal edema Neurology: alert and oriented to time, place, and person  Gait not checked due to patient safety concerns   Data Reviewed: I have Reviewed nursing notes, Vitals, and Lab results since pt's last encounter. Pertinent lab results CBC and CMP I have ordered test including CBC, CMP, cortisol level    Family Communication: None at bedside  Disposition: Status is: Inpatient Remains inpatient appropriate because: Ongoing issues with orthostasis requiring aggressive hydration and further work-up. Author: Berle Mull, MD 02/28/2022 7:38 PM  Please look on [email or web address] to find out who is on call.

## 2022-02-28 NOTE — Hospital Course (Signed)
Past medical history of type II DM, HTN, HLD, HFpEF, DVT on aspirin, recurrent falls, right arm melanoma.  Presents to the hospital with complaints of "mechanical fall found to have right intertrochanteric fracture of the hip.  Underwent intramedullary fixation of the right femur on 6/9 by Dr. Lyla Glassing. Continues to remain orthostatic  postoperatively.

## 2022-02-28 NOTE — Inpatient Diabetes Management (Signed)
Inpatient Diabetes Program Recommendations  AACE/ADA: New Consensus Statement on Inpatient Glycemic Control   Target Ranges:  Prepandial:   less than 140 mg/dL      Peak postprandial:   less than 180 mg/dL (1-2 hours)      Critically ill patients:  140 - 180 mg/dL    Latest Reference Range & Units 02/28/22 01:15 02/28/22 04:33 02/28/22 07:40 02/28/22 11:17  Glucose-Capillary 70 - 99 mg/dL 165 (H) 202 (H) 164 (H) 221 (H)    Latest Reference Range & Units 02/27/22 07:41 02/27/22 11:26 02/27/22 16:13 02/27/22 21:54 02/27/22 22:36  Glucose-Capillary 70 - 99 mg/dL 151 (H) 141 (H) 131 (H) 153 (H) 178 (H)   Review of Glycemic Control  Diabetes history: DM2 Outpatient Diabetes medications: Metformin 1000 mg BID, Amaryl 2 mg daily Current orders for Inpatient glycemic control: Amaryl 2 mg QAM, Novolog 0-15 units TID with meals, Novolog 0-5 units QHS  Inpatient Diabetes Program Recommendations:    Insulin: Noted CBGs and Novolog correction changed from Q4H to AC&HS today. Please consider ordering Semglee 5 units Q24H.  Thanks, Barnie Alderman, RN, MSN, [place] [facility] Coordinator Inpatient Diabetes Program [phone] (Team Pager from 8am to [place])

## 2022-03-01 DIAGNOSIS — S72001A Fracture of unspecified part of neck of right femur, initial encounter for closed fracture: Secondary | ICD-10-CM | POA: Diagnosis not present

## 2022-03-01 LAB — BASIC METABOLIC PANEL
Anion gap: 6 (ref 5–15)
BUN: 23 mg/dL (ref 8–23)
CO2: 24 mmol/L (ref 22–32)
Calcium: 8.6 mg/dL — ABNORMAL LOW (ref 8.9–10.3)
Chloride: 105 mmol/L (ref 98–111)
Creatinine, Ser: 0.65 mg/dL (ref 0.61–1.24)
GFR, Estimated: >60 mL/min (ref >60)
Glucose, Bld: 213 mg/dL — ABNORMAL HIGH (ref 70–99)
Potassium: 4.6 mmol/L (ref 3.5–5.1)
Sodium: 135 mmol/L (ref 135–145)

## 2022-03-01 LAB — CBC
HCT: 28.7 % — ABNORMAL LOW (ref 39.0–52.0)
Hemoglobin: 9.4 g/dL — ABNORMAL LOW (ref 13.0–17.0)
MCH: 32.2 pg (ref 26.0–34.0)
MCHC: 32.8 g/dL (ref 30.0–36.0)
MCV: 98.3 fL (ref 80.0–100.0)
Platelets: 225 10*3/uL (ref 150–400)
RBC: 2.92 MIL/uL — ABNORMAL LOW (ref 4.22–5.81)
RDW: 12.7 % (ref 11.5–15.5)
WBC: 7.2 10*3/uL (ref 4.0–10.5)
nRBC: 0 % (ref 0.0–0.2)

## 2022-03-01 LAB — OSMOLALITY
Osmolality: 291 mOsm/kg (ref 275–295)
Osmolality: 294 mOsm/kg (ref 275–295)
Osmolality: 294 mOsm/kg (ref 275–295)

## 2022-03-01 LAB — GLUCOSE, CAPILLARY
Glucose-Capillary: 212 mg/dL — ABNORMAL HIGH (ref 70–99)
Glucose-Capillary: 259 mg/dL — ABNORMAL HIGH (ref 70–99)
Glucose-Capillary: 210 mg/dL — ABNORMAL HIGH (ref 70–99)
Glucose-Capillary: 150 mg/dL — ABNORMAL HIGH (ref 70–99)

## 2022-03-01 LAB — MAGNESIUM: Magnesium: 2 mg/dL (ref 1.7–2.4)

## 2022-03-01 LAB — CORTISOL-AM, BLOOD: Cortisol - AM: 5.9 ug/dL — ABNORMAL LOW (ref 6.7–22.6)

## 2022-03-01 MED ORDER — INSULIN GLARGINE-YFGN 100 UNIT/ML ~~LOC~~ SOLN
6.0000 [IU] | Freq: Every day | SUBCUTANEOUS | Status: DC
  Administered 2022-03-01 – 2022-03-02 (×2): 6 [IU] via SUBCUTANEOUS
  Filled 2022-03-01 (×2): qty 0.06

## 2022-03-01 MED ORDER — COSYNTROPIN 0.25 MG IJ SOLR
0.2500 mg | Freq: Once | INTRAMUSCULAR | Status: AC
  Administered 2022-03-03: 0.25 mg via INTRAVENOUS
  Filled 2022-03-01 (×2): qty 0.25

## 2022-03-01 NOTE — Progress Notes (Signed)
  Progress Note Patient: Micheal Moore. PPJ:[id number] DOB: 1951-05-19 DOA: 02/22/2022  DOS: the patient was seen and examined on 03/01/2022  Brief hospital course: Past medical history of type II DM, HTN, HLD, HFpEF, DVT on aspirin, recurrent falls, right arm melanoma.  Presents to the hospital with complaints of "mechanical fall found to have right intertrochanteric fracture of the hip.  Underwent intramedullary fixation of the right femur on 6/9 by Dr. Lyla Glassing. Continues to remain orthostatic  postoperatively. Assessment and Plan: Right femur intertrochanteric fracture, closed. SP intramedullary nail fixation on 6/9. Postop wound care, pain management, DVT prophylaxis and weightbearing status per orthopedics. Currently on Lovenox, on discharge aspirin for 4 weeks.  Orthostasis hypotension. Polyuria Blood pressure dropped significantly and the patient is not able to stand for full 3 minutes due to ongoing dizziness. No focal deficit at the time of my evaluation. Aggressively treated with IV fluids despite which the patient remains negative and ins and out from excessive urination. This medically explaining patient's orthostasis although etiology of the polyuria not clear.  Serum osmolality is normal.  Serum sodium level normal and serum creatinine normal. Urine osmolarity urinalysis and urine sodium currently pending. IV fluid is currently discontinued. Continue midodrine as ordered. Stopping the IV fluid and fluid restriction did not modify the serum osmolarity I do not think the patient has primary polydipsia. Patient is not on any medications to cause nephrogenic diabetes insipidus. Given that the urine osmolarity is not available would not be performing DDAVP test for now. Cosyntropin stimulation test ordered as the patient actually has significant drop in cortisol level in the morning.  Postop acute blood loss anemia, expected. Baseline hemoglobin around 12-13. For now  monitor.  Type 2 diabetes mellitus, uncontrolled with hyperglycemia without long-term insulin use. On metformin and glimepiride at home.  Currently on sliding scale insulin.  Monitor.  HLD. Continue statin.  Melanoma of the right arm. Follows up with Dr. Kerin Perna and the pathology.  Active smoker. Counseled to quit. Monitor.  Subjective: No nausea no vomiting no fever no chills.  Reports dizziness on changing position.  Minimal oral intake.  Pain well controlled.  Physical Exam: Vitals:   03/01/22 1004 03/01/22 1013 03/01/22 1023 03/01/22 1407  BP: 107/70 99/66 (!) 142/76 138/74  Pulse:    79  Resp:    18  Temp:    98.1 F (36.7 C)  TempSrc:    Oral  SpO2:    100%  Weight:      Height:       General: Appear in mild distress; no visible Abnormal Neck Mass Or lumps, Conjunctiva normal Cardiovascular: S1 and S2 Present, no Murmur, Respiratory: good respiratory effort, Bilateral Air entry present and CTA, no Crackles, no wheezes Abdomen: Bowel Sound present, Non tender  Extremities: no Pedal edema Neurology: alert and oriented to time, place, and person  Gait not checked due to patient safety concerns   Data Reviewed: I have Reviewed nursing notes, Vitals, and Lab results since pt's last encounter. Pertinent lab results CBC and BMP and cortisol level I have ordered test including CBC and BMP and cosyntropin stimulation test    Family Communication: None at bedside  Disposition: Status is: Inpatient Remains inpatient appropriate because: Needing further work-up and treatment for orthostasis hypotension.  Author: Berle Mull, MD 03/01/2022 6:54 PM  Please look on [email or web address] to find out who is on call.

## 2022-03-01 NOTE — Progress Notes (Signed)
Physical Therapy Treatment Patient Details Name: Micheal Moore. MRN: [id number] DOB: [birth year] Today's Date: 03/01/2022   History of Present Illness Pt s/p fall with with R hip fx and now s/p intramedullary fixation.  Pt with hx of DM, falls, syncope, and L great toe amputation. Pt has been limited by orthostatic hypotension.    PT Comments    Patient was able to make some progress today and transferred OOB to recliner with Mod-Max +2 assist. He required Mod +2 to move to EOB from supine and Max assist to stabilize when dizziness developed. Pt tended to reach out with all extremities for support similar to pt's with BPPV, no nystagmus observed but difficult to assess. Pt did experience orthostatic drop in BP from supine>sit>stand. Symptoms improved with seated rest EOB and time prior to transfer to recliner. EOS pt reclined in chair and BP recovered (see below). Will continue to progress pt as able during acute stay. He remains limited by pain and impaired balance as well as orthostatic hypotension. Continue to recommend rehab at [facility].    03/01/22 0944 03/01/22 1004 03/01/22 1013  Vital Signs  BP  --  107/70 99/66  BP Location  --  Left Arm Left Arm  BP Method  --  Automatic Automatic  Patient Position (if appropriate)  --  Sitting (EOB) Sitting (EOB)  Orthostatic Lying   BP- Lying 144/79  --   --   Pulse- Lying 98  --   --   Orthostatic Standing at 0 minutes  BP- Standing at 0 minutes 110/89  --   --     03/01/22 1023  Vital Signs  BP (!) 142/76  BP Location Left Arm  BP Method Automatic  Patient Position (if appropriate) Sitting  Orthostatic Lying   BP- Lying  --   Pulse- Lying  --   Orthostatic Standing at 0 minutes  BP- Standing at 0 minutes  --        Recommendations for follow up therapy are one component of a multi-disciplinary discharge planning process, led by the attending physician.  Recommendations may be updated based on patient status, additional  functional criteria and insurance authorization.  Follow Up Recommendations  Skilled nursing-short term rehab (<3 hours/day)     Assistance Recommended at Discharge Frequent or constant Supervision/Assistance  Patient can return home with the following Assistance with cooking/housework;Assist for transportation;Help with stairs or ramp for entrance;Two people to help with walking and/or transfers;Two people to help with bathing/dressing/bathroom;Assistance with feeding;Direct supervision/assist for medications management   Equipment Recommendations  Rolling walker (2 wheels)    Recommendations for Other Services       Precautions / Restrictions Precautions Precautions: Fall Precaution Comments: orthostatic, ?vertigo? Restrictions Weight Bearing Restrictions: No RLE Weight Bearing: Weight bearing as tolerated     Mobility  Bed Mobility Overal bed mobility: Needs Assistance Bed Mobility: Supine to Sit     Supine to sit: Mod assist, +2 for physical assistance, +2 for safety/equipment, HOB elevated     General bed mobility comments: Mod +2 to moved to EOB    Transfers Overall transfer level: Needs assistance Equipment used: Rolling walker (2 wheels), 2 person hand held assist Transfers: Bed to chair/wheelchair/BSC, Sit to/from Stand Sit to Stand: Mod assist, +2 physical assistance, +2 safety/equipment, From elevated surface   Step pivot transfers: Mod assist, Max assist, +2 physical assistance, +2 safety/equipment, From elevated surface       General transfer comment: Mod +2 to rise from EOB and  Mod-Max to manage RW and step to recliner. Pt complete 2x sit<>stand from EOB and EOS reclined back in chair with LE's elevated.    Ambulation/Gait                   Stairs             Wheelchair Mobility    Modified Rankin (Stroke Patients Only)       Balance Overall balance assessment: Needs assistance Sitting-balance support: Feet supported,  Bilateral upper extremity supported Sitting balance-[name]: Fair Sitting balance - Comments: poor to fair depending on pt's dizziness. pt demonstrates postural reactions that are similar to those of pt with BPPV   Standing balance support: Bilateral upper extremity supported, Reliant on assistive device for balance Standing balance-[name]: Poor Standing balance comment: heavy reliance on RW and support from therapist                            Cognition Arousal/Alertness: Awake/alert Behavior During Therapy: Impulsive Overall Cognitive Status: No family/caregiver present to determine baseline cognitive functioning Area of Impairment: Attention, Memory, Following commands, Safety/judgement, Awareness, Problem solving                   Current Attention Level: Sustained Memory: Decreased recall of precautions Following Commands: Follows one step commands inconsistently, Follows one step commands with increased time, Follows multi-step commands inconsistently, Follows multi-step commands with increased time Safety/Judgement: Decreased awareness of safety, Decreased awareness of deficits Awareness: Emergent Problem Solving: Difficulty sequencing, Requires verbal cues, Requires tactile cues General Comments: pt with poor safety awareness and rushing/moving impulsively at times. limited due to sensation of dizziness that subsides after ~10-30 seconds.        Exercises      General Comments        Pertinent Vitals/Pain Pain Assessment Pain Assessment: Faces Faces Pain Scale: Hurts even more Pain Location: Bil LE's and neck Pain Descriptors / Indicators: Aching, Grimacing, Sore Pain Intervention(s): Limited activity within patient's tolerance, Monitored during session, Repositioned    Home Living                          Prior Function            PT Goals (current goals can now be found in the care plan section) Acute Rehab PT  Goals Patient Stated Goal: "I don't do anything" when asked what activities he hopes to resume upon recovery PT Goal Formulation: With patient Time For Goal Achievement: 03/10/22 Potential to Achieve Goals: Fair Progress towards PT goals: Progressing toward goals (limited by pain and hypotension)    Frequency    Min 3X/week      PT Plan Current plan remains appropriate;Frequency needs to be updated    Co-evaluation              AM-PAC PT "6 Clicks" Mobility   Outcome Measure  Help needed turning from your back to your side while in a flat bed without using bedrails?: A Lot Help needed moving from lying on your back to sitting on the side of a flat bed without using bedrails?: Total Help needed moving to and from a bed to a chair (including a wheelchair)?: Total Help needed standing up from a chair using your arms (e.g., wheelchair or bedside chair)?: Total Help needed to walk in hospital room?: Total Help needed climbing 3-5 steps with a railing? :  Total 6 Click Score: 7    End of Session Equipment Utilized During Treatment: Gait belt Activity Tolerance: Patient tolerated treatment well Patient left: in chair;with call bell/phone within reach;with chair alarm set Nurse Communication: Mobility status;Need for lift equipment (vitals, BP stable) PT Visit Diagnosis: Unsteadiness on feet (R26.81);Muscle weakness (generalized) (M62.81);Difficulty in walking, not elsewhere classified (R26.2);History of falling (Z91.81);Pain Pain - Right/Left: Right Pain - part of body: Hip     Time: 0375-4360 PT Time Calculation (min) (ACUTE ONLY): 45 min  Charges:  $Therapeutic Activity: 38-52 mins                     Verner Mould, DPT Acute Rehabilitation Services Office [phone] Pager [phone]  03/01/22 12:25 PM

## 2022-03-02 DIAGNOSIS — S72001A Fracture of unspecified part of neck of right femur, initial encounter for closed fracture: Secondary | ICD-10-CM | POA: Diagnosis not present

## 2022-03-02 LAB — URINALYSIS, ROUTINE W REFLEX MICROSCOPIC
Bacteria, UA: NONE SEEN
Bilirubin Urine: NEGATIVE
Glucose, UA: >=500 mg/dL — AB
Hgb urine dipstick: NEGATIVE
Ketones, ur: NEGATIVE mg/dL
Leukocytes,Ua: NEGATIVE
Nitrite: NEGATIVE
Protein, ur: NEGATIVE mg/dL
Specific Gravity, Urine: 1.016 (ref 1.005–1.030)
pH: 5 (ref 5.0–8.0)

## 2022-03-02 LAB — BASIC METABOLIC PANEL
Anion gap: 8 (ref 5–15)
BUN: 24 mg/dL — ABNORMAL HIGH (ref 8–23)
CO2: 28 mmol/L (ref 22–32)
Calcium: 8.8 mg/dL — ABNORMAL LOW (ref 8.9–10.3)
Chloride: 100 mmol/L (ref 98–111)
Creatinine, Ser: 0.7 mg/dL (ref 0.61–1.24)
GFR, Estimated: >60 mL/min (ref >60)
Glucose, Bld: 217 mg/dL — ABNORMAL HIGH (ref 70–99)
Potassium: 4.9 mmol/L (ref 3.5–5.1)
Sodium: 136 mmol/L (ref 135–145)

## 2022-03-02 LAB — CBC
HCT: 32.2 % — ABNORMAL LOW (ref 39.0–52.0)
Hemoglobin: 10.3 g/dL — ABNORMAL LOW (ref 13.0–17.0)
MCH: 31.4 pg (ref 26.0–34.0)
MCHC: 32 g/dL (ref 30.0–36.0)
MCV: 98.2 fL (ref 80.0–100.0)
Platelets: 241 10*3/uL (ref 150–400)
RBC: 3.28 MIL/uL — ABNORMAL LOW (ref 4.22–5.81)
RDW: 12.8 % (ref 11.5–15.5)
WBC: 8 10*3/uL (ref 4.0–10.5)
nRBC: 0 % (ref 0.0–0.2)

## 2022-03-02 LAB — GLUCOSE, CAPILLARY
Glucose-Capillary: 248 mg/dL — ABNORMAL HIGH (ref 70–99)
Glucose-Capillary: 310 mg/dL — ABNORMAL HIGH (ref 70–99)
Glucose-Capillary: 215 mg/dL — ABNORMAL HIGH (ref 70–99)
Glucose-Capillary: 254 mg/dL — ABNORMAL HIGH (ref 70–99)

## 2022-03-02 LAB — OSMOLALITY, URINE: Osmolality, Ur: 680 mOsm/kg (ref 300–900)

## 2022-03-02 LAB — NA AND K (SODIUM & POTASSIUM), RAND UR
Potassium Urine: 42 mmol/L
Sodium, Ur: 90 mmol/L

## 2022-03-02 LAB — MAGNESIUM: Magnesium: 2.3 mg/dL (ref 1.7–2.4)

## 2022-03-02 LAB — CREATININE, URINE, RANDOM: Creatinine, Urine: 70.24 mg/dL

## 2022-03-02 MED ORDER — INSULIN GLARGINE-YFGN 100 UNIT/ML ~~LOC~~ SOLN
10.0000 [IU] | Freq: Every day | SUBCUTANEOUS | Status: DC
  Administered 2022-03-03 – 2022-03-05 (×3): 10 [IU] via SUBCUTANEOUS
  Filled 2022-03-02 (×3): qty 0.1

## 2022-03-02 NOTE — Progress Notes (Signed)
Nutrition Follow-up  INTERVENTION:   -Glucerna Shake po TID, each supplement provides 220 kcal and 10 grams of protein   -Multivitamin with minerals daily   NUTRITION DIAGNOSIS:   Increased nutrient needs related to hip fracture, post-op healing as evidenced by estimated needs.  Ongoing.  GOAL:   Patient will meet greater than or equal to 90% of their needs  Progressing.  MONITOR:   Diet advancement, PO intake, Supplement acceptance, Labs, Weight trends, Skin  ASSESSMENT:   71 y.o. male with medical history of type 2 DM, DVT on aspirin, frequent falls, tobacco use, melanoma, and HLD. He presented to the ED after a fall onto a concrete walkway landing on his R hip. He was admitted for R hip fracture.  Patient currently consuming 100% of meals. Not accepting Glucerna shakes at this time.  Admission weight: 195 lbs No new weights this admission.  Medications: Multivitamin with minerals daily, Miralax, Senokot   Labs reviewed:  CBGs: 150-310  Diet Order:   Diet Order             Diet Carb Modified Fluid consistency: Thin; Room service appropriate? Yes  Diet effective now                   EDUCATION NEEDS:   No education needs have been identified at this time  Skin:  Skin Assessment: Skin Integrity Issues: Skin Integrity Issues:: Incisions Incisions: 6/9 right hip  Last BM:  6/14 -type 4  Height:   Ht Readings from Last 1 Encounters:  02/23/22 '6\' 5"'$  (1.956 m)    Weight:   Wt Readings from Last 1 Encounters:  02/23/22 88.5 kg    Ideal Body Weight:  94.5 kg  BMI:  Body mass index is 23.14 kg/m.  Estimated Nutritional Needs:   Kcal:  2200-2400 kcal  Protein:  105-120 grams  Fluid:  >/= 2.5 L/day  Clayton Bibles, MS, RD, LDN Inpatient Clinical Dietitian Contact information available via Amion

## 2022-03-02 NOTE — Progress Notes (Signed)
Inpatient Diabetes Program Recommendations  AACE/ADA: New Consensus Statement on Inpatient Glycemic Control (2015)  Target Ranges:  Prepandial:   less than 140 mg/dL      Peak postprandial:   less than 180 mg/dL (1-2 hours)      Critically ill patients:  140 - 180 mg/dL   Lab Results  Component Value Date   GLUCAP 215 (H) 03/02/2022   HGBA1C 7.2 (H) 02/22/2022    Review of Glycemic Control  Diabetes history: DM2 Outpatient Diabetes medications: metformin 1000 mg BID, Amaryl 2 mg QD Current orders for Inpatient glycemic control: Amaryl 2 mg QAM, Semglee 6 units QD, Novolog 0-15 units TID with meals and 0-5 HS  Inpatient Diabetes Program Recommendations:    Increase Semglee to 10 units QD Add Novolog 3 units TID with meals if eating > 50%  Continue to titrate insulin according to glucose trends.  Thank you. Lorenda Peck, RD, LDN, CDE Inpatient Diabetes Coordinator [phone]

## 2022-03-02 NOTE — Progress Notes (Signed)
Progress Note Patient: Micheal Moore. 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  Presents to the hospital with complaints of "mechanical fall found to have right intertrochanteric fracture of the hip.  Underwent intramedullary fixation of the right femur on 6/9 by Dr. Lyla Glassing. Continues to remain orthostatic  postoperatively. Assessment and Plan: Right femur intertrochanteric fracture, closed. SP intramedullary nail fixation on 6/9. Postop wound care, pain management, DVT prophylaxis and weightbearing status per orthopedics. Weightbearing as tolerated with walker. Currently on Lovenox, on discharge aspirin 81 mg twice daily for 4 weeks.  Orthostasis hypotension. Low cortisol level. Polyuria Blood pressure dropped significantly and the patient is not able to stand for full 3 minutes due to ongoing dizziness. No focal deficit at the time of my evaluation. Aggressively treated with IV fluids despite which the patient remains negative 9 L for the hospital stay. etiology of the polyuria not clear. Serum osmolality is normal.  Serum sodium level normal and serum creatinine normal. Urine osmolarity 680  Unremarkable urinalysis and urine sodium 90. Continue midodrine as ordered. Stopping the IV fluid and fluid restriction did not increase the serum osmolarity. I do not think the patient has primary polydipsia. Patient is not on any medications to cause nephrogenic diabetes insipidus. Cortisol level 5.9. Cosyntropin stimulation test ordered on 6/15 to be performed on 6/16.  Unfortunately patient did not receive the test and currently scheduled for 6/17.  I would like to avoid subjecting the patient to lifelong steroids when he appears to have possible osteoporotic fracture of femur as well as  uncontrolled diabetes.  Postop acute blood loss anemia, expected. Baseline hemoglobin around 12-13.  Dropped to 9 currently at 10. For now monitor.  Type 2 diabetes mellitus, uncontrolled with hyperglycemia without long-term insulin use. Hemoglobin A1c 7.2. On metformin and glimepiride at home.  Currently on sliding scale insulin.   Lantus added on 6/16, will increase the dose due to persistent hyperglycemia. Monitor.  HLD. Continue statin.  Melanoma of the right arm. Follows up with Dr. Kerin Perna.  Active smoker. Counseled to quit. Monitor.  Subjective: Dizziness improving.  No nausea no vomiting no fever no chills.  Physical Exam: Vitals:   03/01/22 1407 03/01/22 2108 03/02/22 0534 03/02/22 1322  BP: 138/74 125/67 137/78 133/65  Pulse: 79 95 96 89  Resp: '18 18 18 16  '$ Temp: 98.1 F (36.7 C) 98.1 F (36.7 C) 97.8 F (36.6 C) 97.7 F (36.5 C)  TempSrc: Oral Oral Oral Oral  SpO2: 100% 100% 97% 100%  Weight:      Height:       General: Appear in mild distress; no visible Abnormal Neck Mass Or lumps, Conjunctiva normal Cardiovascular: S1 and S2 Present, no Murmur, Respiratory: good respiratory effort, Bilateral Air entry present and CTA, no Crackles, no wheezes Abdomen: Bowel Sound present, Non tender  Extremities: no Pedal edema Neurology: alert and oriented to time, place, and person  Gait not checked due to patient safety concerns   Data Reviewed: I have Reviewed nursing notes, Vitals, and Lab results since pt's last encounter. Pertinent lab results CBC and BMP I have ordered test including CBC BMP and cosyntropin stimulation test    Family Communication: None at bedside  Disposition: Status is: Inpatient Remains inpatient appropriate because: Requiring further work-up for his polyuria and orthostatic hypotension as well  as low cortisol level.  Author: Berle Mull, MD 03/02/2022 5:42 PM  Please look on [email or web address] to find out who is on call.

## 2022-03-02 NOTE — TOC Progression Note (Signed)
Transition of Care (TOC) - Progression Note    Patient Details  Name: Micheal Moore. MRN: [id number] Date of Birth: 11-01-1950  Transition of Care [facility]) CM/SW Contact  Lennart Pall, LCSW Phone Number: 03/02/2022, 12:17 PM  Clinical Narrative:    Pt not yet medically cleared for dc to SNF but hopeful he may be ready tomorrow/ over weekend.  Have alerted admissions liaison at [facility] who has contacted insurance and was able to get authorization extended thru Monday 6/19.  Will alert weekend TOC to possible weekend dc.  Pt and wife aware and agreeable.   Expected Discharge Plan: Skilled Nursing Facility Barriers to Discharge: Ship broker, Continued Medical Work up  Expected Discharge Plan and Services Expected Discharge Plan: [place] Choice: [place] arrangements for the past 2 months: Single Family Home                                       Social Determinants of Health (SDOH) Interventions    Readmission Risk Interventions     No data to display

## 2022-03-02 NOTE — Progress Notes (Signed)
Physical Therapy Treatment Patient Details Name: Micheal Moore. MRN: [id number] DOB: 11/16/1950 Today's Date: 03/02/2022   History of Present Illness Pt s/p fall with with R hip fx and now s/p intramedullary fixation.  Pt with hx of DM, falls, syncope, and L great toe amputation. Pt has been limited by orthostatic hypotension.    PT Comments    Pt continues cooperative but progressing slowly with mobility and continues to require significant assist of 2 for safe performance of all basic mobility tasks.  Progress with ambulation limited by ongoing orthostatic hypotension.   Recommendations for follow up therapy are one component of a multi-disciplinary discharge planning process, led by the attending physician.  Recommendations may be updated based on patient status, additional functional criteria and insurance authorization.  Follow Up Recommendations  Skilled nursing-short term rehab (<3 hours/day)     Assistance Recommended at Discharge Frequent or constant Supervision/Assistance  Patient can return home with the following Assistance with cooking/housework;Assist for transportation;Help with stairs or ramp for entrance;Two people to help with walking and/or transfers;Two people to help with bathing/dressing/bathroom;Assistance with feeding;Direct supervision/assist for medications management   Equipment Recommendations  Rolling walker (2 wheels)    Recommendations for Other Services       Precautions / Restrictions Precautions Precautions: Fall Precaution Comments: orthostatic Restrictions Weight Bearing Restrictions: No RLE Weight Bearing: Weight bearing as tolerated     Mobility  Bed Mobility Overal bed mobility: Needs Assistance Bed Mobility: Sit to Supine     Supine to sit: Min assist, +2 for physical assistance, +2 for safety/equipment Sit to supine: Min assist, Mod assist, +2 for physical assistance, +2 for safety/equipment   General bed mobility  comments: cues for sequence and use of L LE to self assist.  Physical assist to manage LEs onto bed and to control trunk    Transfers Overall transfer level: Needs assistance Equipment used: Rolling walker (2 wheels) Transfers: Bed to chair/wheelchair/BSC Sit to Stand: Min assist, Mod assist, +2 physical assistance, +2 safety/equipment, From elevated surface   Step pivot transfers: Min assist, Mod assist, +2 physical assistance, +2 safety/equipment, From elevated surface       General transfer comment: cues for LE management and use of UEs to self assist.  Physical assist to bring wt up and fwd and to balance in initial standing. Step pvt with RW from recliner to bed    Ambulation/Gait         Gait velocity: decr     General Gait Details: step pvt recliner to bed only   Stairs             Wheelchair Mobility    Modified Rankin (Stroke Patients Only)       Balance Overall balance assessment: Needs assistance Sitting-balance support: Feet supported, No upper extremity supported Sitting balance-[name]: Fair     Standing balance support: Bilateral upper extremity supported, Reliant on assistive device for balance Standing balance-[name]: Poor Standing balance comment: heavy reliance on RW and support from therapist                            Cognition Arousal/Alertness: Awake/alert Behavior During Therapy: Impulsive, WFL for tasks assessed/performed Overall Cognitive Status: No family/caregiver present to determine baseline cognitive functioning Area of Impairment: Attention, Memory, Following commands, Safety/judgement, Awareness, Problem solving                       Following  Commands: Follows one step commands with increased time, Follows one step commands consistently Safety/Judgement: Decreased awareness of safety   Problem Solving: Difficulty sequencing, Requires verbal cues, Requires tactile cues          Exercises  General Exercises - Lower Extremity Ankle Circles/Pumps: AROM, Both, 15 reps, Supine Quad Sets: AROM, Both, 10 reps, Supine Heel Slides: AAROM, Right, Supine, 20 reps Hip ABduction/ADduction: AAROM, Right, Supine, 15 reps    General Comments        Pertinent Vitals/Pain Pain Assessment Pain Assessment: 0-10 Pain Score: 6  Pain Location: Bil LE's and back Pain Descriptors / Indicators: Aching, Grimacing, Sore Pain Intervention(s): Limited activity within patient's tolerance, Monitored during session    Home Living                          Prior Function            PT Goals (current goals can now be found in the care plan section) Acute Rehab PT Goals Patient Stated Goal: Get better PT Goal Formulation: With patient Time For Goal Achievement: 03/10/22 Potential to Achieve Goals: Fair Progress towards PT goals: Progressing toward goals    Frequency    Min 3X/week      PT Plan Current plan remains appropriate;Frequency needs to be updated    Co-evaluation              AM-PAC PT "6 Clicks" Mobility   Outcome Measure  Help needed turning from your back to your side while in a flat bed without using bedrails?: A Lot Help needed moving from lying on your back to sitting on the side of a flat bed without using bedrails?: A Lot Help needed moving to and from a bed to a chair (including a wheelchair)?: A Lot Help needed standing up from a chair using your arms (e.g., wheelchair or bedside chair)?: A Lot Help needed to walk in hospital room?: Total Help needed climbing 3-5 steps with a railing? : Total 6 Click Score: 10    End of Session Equipment Utilized During Treatment: Gait belt Activity Tolerance: Patient tolerated treatment well;Patient limited by fatigue;Patient limited by pain Patient left: in bed;with call bell/phone within reach;with bed alarm set Nurse Communication: Mobility status;Need for lift equipment PT Visit Diagnosis: Unsteadiness  on feet (R26.81);Muscle weakness (generalized) (M62.81);Difficulty in walking, not elsewhere classified (R26.2);History of falling (Z91.81);Pain Pain - Right/Left: Right Pain - part of body: Hip     Time: 5449-2010 PT Time Calculation (min) (ACUTE ONLY): 11 min  Charges:  $Therapeutic Activity: 8-22 mins                     [place] Pager [phone] Office [phone]    Meria Crilly 03/02/2022, 2:58 PM

## 2022-03-02 NOTE — Care Management Important Message (Signed)
Important Message  Patient Details IM Letter given to the Patient Name: Micheal Moore. MRN: [id number] Date of Birth: [birth year]   Medicare Important Message Given:  Yes     Kerin Salen 03/02/2022, 3:34 PM

## 2022-03-02 NOTE — Progress Notes (Signed)
Physical Therapy Treatment Patient Details Name: Micheal Moore. MRN: [id number] DOB: 1951/05/09 Today's Date: 03/02/2022   History of Present Illness Pt s/p fall with with R hip fx and now s/p intramedullary fixation.  Pt with hx of DM, falls, syncope, and L great toe amputation. Pt has been limited by orthostatic hypotension.    PT Comments    Pt continues cooperative but limited by orthostatic hypotension.  BP supine141/78; sit 112/70; after step/pvt transfer to chair 89/62 - pt denies dizziness - RN aware.   Recommendations for follow up therapy are one component of a multi-disciplinary discharge planning process, led by the attending physician.  Recommendations may be updated based on patient status, additional functional criteria and insurance authorization.  Follow Up Recommendations  Skilled nursing-short term rehab (<3 hours/day)     Assistance Recommended at Discharge Frequent or constant Supervision/Assistance  Patient can return home with the following Assistance with cooking/housework;Assist for transportation;Help with stairs or ramp for entrance;Two people to help with walking and/or transfers;Two people to help with bathing/dressing/bathroom;Assistance with feeding;Direct supervision/assist for medications management   Equipment Recommendations  Rolling walker (2 wheels)    Recommendations for Other Services       Precautions / Restrictions Precautions Precautions: Fall Precaution Comments: orthostatic Restrictions Weight Bearing Restrictions: No RLE Weight Bearing: Weight bearing as tolerated     Mobility  Bed Mobility Overal bed mobility: Needs Assistance Bed Mobility: Supine to Sit     Supine to sit: Min assist, +2 for physical assistance, +2 for safety/equipment     General bed mobility comments: cues for sequence and use of L LE to self assist.  Use of bedrails and physical assist to manage R LE and to bring trunk to upright     Transfers Overall transfer level: Needs assistance Equipment used: Rolling walker (2 wheels) Transfers: Bed to chair/wheelchair/BSC Sit to Stand: Mod assist, +2 physical assistance, +2 safety/equipment, From elevated surface   Step pivot transfers: Min assist, Mod assist, +2 physical assistance, +2 safety/equipment, From elevated surface       General transfer comment: Mod +2 to rise from EOB and Min-mod to manage RW and step to recliner.    Ambulation/Gait               General Gait Details: step pvt bed to chair only - limited by orthostatic BP   Stairs             Wheelchair Mobility    Modified Rankin (Stroke Patients Only)       Balance Overall balance assessment: Needs assistance Sitting-balance support: Feet supported, No upper extremity supported Sitting balance-[name]: Fair     Standing balance support: Bilateral upper extremity supported, Reliant on assistive device for balance Standing balance-[name]: Poor                              Cognition Arousal/Alertness: Awake/alert Behavior During Therapy: Impulsive, WFL for tasks assessed/performed Overall Cognitive Status: No family/caregiver present to determine baseline cognitive functioning Area of Impairment: Attention, Memory, Following commands, Safety/judgement, Awareness, Problem solving                       Following Commands: Follows one step commands with increased time, Follows one step commands consistently Safety/Judgement: Decreased awareness of safety   Problem Solving: Difficulty sequencing, Requires verbal cues, Requires tactile cues          Exercises  General Exercises - Lower Extremity Ankle Circles/Pumps: AROM, Both, 15 reps, Supine Quad Sets: AROM, Both, 10 reps, Supine Heel Slides: AAROM, Right, Supine, 20 reps Hip ABduction/ADduction: AAROM, Right, Supine, 15 reps    General Comments        Pertinent Vitals/Pain Pain  Assessment Pain Assessment: 0-10 Pain Score: 5  Pain Location: Bil LE's and neck Pain Descriptors / Indicators: Aching, Grimacing, Sore Pain Intervention(s): Limited activity within patient's tolerance, Monitored during session, Premedicated before session    Home Living                          Prior Function            PT Goals (current goals can now be found in the care plan section) Acute Rehab PT Goals Patient Stated Goal: Get better PT Goal Formulation: With patient Time For Goal Achievement: 03/10/22 Potential to Achieve Goals: Fair Progress towards PT goals: Progressing toward goals    Frequency    Min 3X/week      PT Plan Current plan remains appropriate;Frequency needs to be updated    Co-evaluation              AM-PAC PT "6 Clicks" Mobility   Outcome Measure  Help needed turning from your back to your side while in a flat bed without using bedrails?: A Lot Help needed moving from lying on your back to sitting on the side of a flat bed without using bedrails?: A Lot Help needed moving to and from a bed to a chair (including a wheelchair)?: A Lot Help needed standing up from a chair using your arms (e.g., wheelchair or bedside chair)?: A Lot Help needed to walk in hospital room?: Total Help needed climbing 3-5 steps with a railing? : Total 6 Click Score: 10    End of Session Equipment Utilized During Treatment: Gait belt Activity Tolerance: Other (comment) (orthostatic) Patient left: in chair;with call bell/phone within reach;with chair alarm set Nurse Communication: Mobility status;Need for lift equipment PT Visit Diagnosis: Unsteadiness on feet (R26.81);Muscle weakness (generalized) (M62.81);Difficulty in walking, not elsewhere classified (R26.2);History of falling (Z91.81);Pain Pain - Right/Left: Right Pain - part of body: Hip     Time: 1031-1101 PT Time Calculation (min) (ACUTE ONLY): 30 min  Charges:  $Therapeutic Exercise: 8-22  mins $Therapeutic Activity: 8-22 mins                     Debe Coder PT Acute Rehabilitation Services Pager [phone] Office [phone]    Micheal Moore 03/02/2022, 11:09 AM

## 2022-03-03 DIAGNOSIS — S72001A Fracture of unspecified part of neck of right femur, initial encounter for closed fracture: Secondary | ICD-10-CM | POA: Diagnosis not present

## 2022-03-03 LAB — CBC
HCT: 31 % — ABNORMAL LOW (ref 39.0–52.0)
Hemoglobin: 10 g/dL — ABNORMAL LOW (ref 13.0–17.0)
MCH: 31.4 pg (ref 26.0–34.0)
MCHC: 32.3 g/dL (ref 30.0–36.0)
MCV: 97.5 fL (ref 80.0–100.0)
Platelets: 241 10*3/uL (ref 150–400)
RBC: 3.18 MIL/uL — ABNORMAL LOW (ref 4.22–5.81)
RDW: 12.8 % (ref 11.5–15.5)
WBC: 6.9 10*3/uL (ref 4.0–10.5)
nRBC: 0 % (ref 0.0–0.2)

## 2022-03-03 LAB — BASIC METABOLIC PANEL
Anion gap: 7 (ref 5–15)
BUN: 30 mg/dL — ABNORMAL HIGH (ref 8–23)
CO2: 26 mmol/L (ref 22–32)
Calcium: 8.9 mg/dL (ref 8.9–10.3)
Chloride: 101 mmol/L (ref 98–111)
Creatinine, Ser: 0.74 mg/dL (ref 0.61–1.24)
GFR, Estimated: >60 mL/min (ref >60)
Glucose, Bld: 201 mg/dL — ABNORMAL HIGH (ref 70–99)
Potassium: 4.6 mmol/L (ref 3.5–5.1)
Sodium: 134 mmol/L — ABNORMAL LOW (ref 135–145)

## 2022-03-03 LAB — GLUCOSE, CAPILLARY
Glucose-Capillary: 107 mg/dL — ABNORMAL HIGH (ref 70–99)
Glucose-Capillary: 246 mg/dL — ABNORMAL HIGH (ref 70–99)
Glucose-Capillary: 262 mg/dL — ABNORMAL HIGH (ref 70–99)
Glucose-Capillary: 196 mg/dL — ABNORMAL HIGH (ref 70–99)
Glucose-Capillary: 220 mg/dL — ABNORMAL HIGH (ref 70–99)

## 2022-03-03 LAB — MAGNESIUM: Magnesium: 2.3 mg/dL (ref 1.7–2.4)

## 2022-03-03 LAB — ACTH STIMULATION, 3 TIME POINTS
Cortisol, 30 Min: 28 ug/dL
Cortisol, 60 Min: 32.3 ug/dL
Cortisol, Base: 19.4 ug/dL

## 2022-03-03 MED ORDER — FLUDROCORTISONE ACETATE 0.1 MG PO TABS
0.1000 mg | ORAL_TABLET | Freq: Every day | ORAL | Status: DC
  Administered 2022-03-03 – 2022-03-04 (×2): 0.1 mg via ORAL
  Filled 2022-03-03 (×3): qty 1

## 2022-03-03 NOTE — Progress Notes (Signed)
Physical Therapy Treatment Patient Details Name: Micheal Moore. MRN: [id number] DOB: 06-Feb-1951 Today's Date: 03/03/2022   History of Present Illness Pt s/p fall with with R hip fx and now s/p intramedullary fixation.  Pt with hx of DM, falls, syncope, and L great toe amputation. Pt has been limited by orthostatic hypotension.    PT Comments    Pt continues cooperative and with noted decreased assist required for bed mobility but OOB activity continues ltd by orthostatic hypotension.  BP supine 156/82, sit 102/54, stand 94/40 with c/o dizziness - RN aware.  Recommendations for follow up therapy are one component of a multi-disciplinary discharge planning process, led by the attending physician.  Recommendations may be updated based on patient status, additional functional criteria and insurance authorization.  Follow Up Recommendations  Skilled nursing-short term rehab (<3 hours/day)     Assistance Recommended at Discharge Frequent or constant Supervision/Assistance  Patient can return home with the following Assistance with cooking/housework;Assist for transportation;Help with stairs or ramp for entrance;Two people to help with walking and/or transfers;Two people to help with bathing/dressing/bathroom;Assistance with feeding;Direct supervision/assist for medications management   Equipment Recommendations  Rolling walker (2 wheels)    Recommendations for Other Services       Precautions / Restrictions Precautions Precautions: Fall Precaution Comments: orthostatic Restrictions Weight Bearing Restrictions: No RLE Weight Bearing: Weight bearing as tolerated     Mobility  Bed Mobility Overal bed mobility: Needs Assistance Bed Mobility: Supine to Sit     Supine to sit: Min assist     General bed mobility comments: cues for sequence and use of L LE to self assist.  Physical assist to manage LEs onto bed and to control trunk    Transfers Overall transfer level:  Needs assistance Equipment used: Rolling walker (2 wheels) Transfers: Bed to chair/wheelchair/BSC Sit to Stand: Min assist, Mod assist, +2 physical assistance, +2 safety/equipment, From elevated surface   Step pivot transfers: Min assist, Mod assist, +2 physical assistance, +2 safety/equipment, From elevated surface       General transfer comment: cues for LE management and use of UEs to self assist.  Physical assist to bring wt up and fwd and to balance in initial standing. Step pvt with RW from recliner to bed    Ambulation/Gait         Gait velocity: decr     General Gait Details: step pvt recliner to bed only   Stairs             Wheelchair Mobility    Modified Rankin (Stroke Patients Only)       Balance Overall balance assessment: Needs assistance Sitting-balance support: Feet supported, No upper extremity supported Sitting balance-[name]: Fair Sitting balance - Comments: poor to fair depending on pt's dizziness. pt demonstrates postural reactions that are similar to those of pt with BPPV   Standing balance support: Bilateral upper extremity supported, Reliant on assistive device for balance Standing balance-[name]: Poor Standing balance comment: heavy reliance on RW and support from therapist                            Cognition Arousal/Alertness: Awake/alert Behavior During Therapy: Impulsive, WFL for tasks assessed/performed Overall Cognitive Status: No family/caregiver present to determine baseline cognitive functioning Area of Impairment: Attention, Memory, Following commands, Safety/judgement, Awareness, Problem solving                   Current Attention  Level: Sustained Memory: Decreased recall of precautions Following Commands: Follows one step commands with increased time, Follows one step commands consistently Safety/Judgement: Decreased awareness of safety     General Comments: pt with poor safety awareness and  rushing/moving impulsively at times. limited due to sensation of dizziness that subsides after ~10-30 seconds.        Exercises      General Comments        Pertinent Vitals/Pain Pain Assessment Pain Assessment: 0-10 Pain Score: 5  Pain Location: Bil LE's and back Pain Descriptors / Indicators: Aching, Grimacing, Sore Pain Intervention(s): Limited activity within patient's tolerance, Monitored during session, Premedicated before session    Home Living                          Prior Function            PT Goals (current goals can now be found in the care plan section) Acute Rehab PT Goals Patient Stated Goal: Get better PT Goal Formulation: With patient Time For Goal Achievement: 03/10/22 Potential to Achieve Goals: Fair Progress towards PT goals: Progressing toward goals    Frequency    Min 3X/week      PT Plan Current plan remains appropriate;Frequency needs to be updated    Co-evaluation              AM-PAC PT "6 Clicks" Mobility   Outcome Measure  Help needed turning from your back to your side while in a flat bed without using bedrails?: A Lot Help needed moving from lying on your back to sitting on the side of a flat bed without using bedrails?: A Lot Help needed moving to and from a bed to a chair (including a wheelchair)?: A Lot Help needed standing up from a chair using your arms (e.g., wheelchair or bedside chair)?: A Lot Help needed to walk in hospital room?: Total Help needed climbing 3-5 steps with a railing? : Total 6 Click Score: 10    End of Session Equipment Utilized During Treatment: Gait belt Activity Tolerance: Patient tolerated treatment well;Patient limited by fatigue;Patient limited by pain Patient left: in chair;with call bell/phone within reach;with chair alarm set Nurse Communication: Mobility status PT Visit Diagnosis: Unsteadiness on feet (R26.81);Muscle weakness (generalized) (M62.81);Difficulty in walking, not  elsewhere classified (R26.2);History of falling (Z91.81);Pain Pain - Right/Left: Right Pain - part of body: Hip     Time: 1100-1116 PT Time Calculation (min) (ACUTE ONLY): 16 min  Charges:  $Therapeutic Activity: 8-22 mins                     Debe Coder PT Acute Rehabilitation Services Pager [phone] Office [phone]    Dacotah Cabello 03/03/2022, 12:27 PM

## 2022-03-03 NOTE — Progress Notes (Addendum)
Progress Note Patient: Micheal Moore. OZD:[id number] DOB: Sep 24, 1950 DOA: 02/22/2022  DOS: the patient was seen and examined on 03/03/2022  Brief hospital course: Past medical history of type II DM, HTN, HLD, HFpEF, DVT on aspirin, recurrent falls, right arm melanoma.  Presents to the hospital with complaints of "mechanical fall found to have right intertrochanteric fracture of the hip.  Underwent intramedullary fixation of the right femur on 6/9 by Dr. Lyla Glassing. Continues to remain orthostatic  postoperatively.  Assessment and Plan: Right femur intertrochanteric fracture, closed. SP intramedullary nail fixation on 6/9. Postop wound care, pain management, DVT prophylaxis and weightbearing status per orthopedics. Weightbearing as tolerated with walker. Currently on Lovenox, on discharge aspirin 81 mg twice daily for 4 weeks. PT continues to recommend SNF.  Orthostasis hypotension. Low cortisol level. Polyuria Blood pressure dropped significantly and the patient is not able to stand for full 3 minutes due to ongoing dizziness. No focal deficit. Patient is suffering from polyuria and therefore despite aggressive diuresis remains negative for ins and out balance. Etiology of the polyuria not clear. Serum osmolality is normal.  Serum sodium level normal and serum creatinine normal. Urine osmolarity 680  Unremarkable urinalysis and urine sodium 90. Continue midodrine as ordered. Stopping the IV fluid and fluid restriction did not increase the serum osmolarity. I do not think the patient has primary polydipsia. Patient is not on any medications to cause nephrogenic diabetes insipidus. Cortisol level significantly fluctuating during the morning.  Lowest cortisol level was 5.9. Cosyntropin stimulation test ordered on 6/15, delayed till 6/17.  Appears to be negative. Will initiate patient on Florinef and monitor. Blood pressure dropped significantly from 259 systolic to 90 systolic on  standing.  Postop acute blood loss anemia, expected. Baseline hemoglobin around 12-13.  Dropped to 9 currently at 10. For now monitor.  Type 2 diabetes mellitus, uncontrolled with hyperglycemia without long-term insulin use. Hemoglobin A1c 7.2. On metformin and glimepiride at home.  Currently on sliding scale insulin.   Lantus added on 6/16, continue current regimen. Monitor.  HLD. Continue statin.  Melanoma of the right arm. Follows up with Dr. Kerin Perna.  Active smoker. Counseled to quit. Monitor.  Subjective: No nausea no vomiting no fever no chills.  Dizziness on standing up.  No focal deficit.  Physical Exam: Vitals:   03/03/22 0513 03/03/22 1102 03/03/22 1355 03/03/22 1807  BP: (!) 165/82 (!) 156/82 121/61 (!) 144/74  Pulse: 100 (!) 104 90 97  Resp:   16 17  Temp:   99 F (37.2 C) 98.6 F (37 C)  TempSrc:   Oral Oral  SpO2: 99%  100% 98%  Weight:      Height:       General: Appear in mild distress; no visible Abnormal Neck Mass Or lumps, Conjunctiva normal Cardiovascular: S1 and S2 Present, no Murmur, Respiratory: good respiratory effort, Bilateral Air entry present and CTA, no Crackles, no wheezes Abdomen: Bowel Sound present, Non tender Extremities: no Pedal edema Neurology: alert and oriented to time, place, and person  Gait not checked due to patient safety concerns   Data Reviewed: I have Reviewed nursing notes, Vitals, and Lab results since pt's last encounter. Pertinent lab results CBC, BMP, cortisol level I have ordered test including BMP    Family Communication: Left voicemail with wife on the phone.  Disposition: Status is: Inpatient Remains inpatient appropriate because: Severely orthostatic requiring therapy modification.  Author: Berle Mull, MD 03/03/2022 6:38 PM  Please look on [email or web address] to find out  who is on call.

## 2022-03-03 NOTE — Progress Notes (Signed)
Physical Therapy Treatment Patient Details Name: Micheal Moore. MRN: [id number] DOB: [birth year] Today's Date: 03/03/2022   History of Present Illness Pt s/p fall with with R hip fx and now s/p intramedullary fixation.  Pt with hx of DM, falls, syncope, and L great toe amputation. Pt has been limited by orthostatic hypotension.    PT Comments    Pt progress continues slow 2* continued symptomatic hypotension with attempts at OOB activity.  Pt is progressing with slow improvement in level of assist required for most mobility tasks.   Recommendations for follow up therapy are one component of a multi-disciplinary discharge planning process, led by the attending physician.  Recommendations may be updated based on patient status, additional functional criteria and insurance authorization.  Follow Up Recommendations  Skilled nursing-short term rehab (<3 hours/day)     Assistance Recommended at Discharge Frequent or constant Supervision/Assistance  Patient can return home with the following Assistance with cooking/housework;Assist for transportation;Help with stairs or ramp for entrance;Two people to help with walking and/or transfers;Two people to help with bathing/dressing/bathroom;Assistance with feeding;Direct supervision/assist for medications management   Equipment Recommendations  Rolling walker (2 wheels)    Recommendations for Other Services       Precautions / Restrictions Precautions Precautions: Fall Precaution Comments: orthostatic Restrictions Weight Bearing Restrictions: No RLE Weight Bearing: Weight bearing as tolerated     Mobility  Bed Mobility Overal bed mobility: Needs Assistance Bed Mobility: Sit to Supine     Supine to sit: Min assist Sit to supine: Min assist, Mod assist   General bed mobility comments: cues for sequence and use of L LE to self assist.  Physical assist to manage LEs onto bed and to control trunk    Transfers Overall transfer  level: Needs assistance Equipment used: Rolling walker (2 wheels) Transfers: Bed to chair/wheelchair/BSC Sit to Stand: Min assist, Mod assist, +2 physical assistance, +2 safety/equipment, From elevated surface   Step pivot transfers: Min assist, Mod assist, +2 physical assistance, +2 safety/equipment, From elevated surface       General transfer comment: cues for LE management and use of UEs to self assist.  Physical assist to bring wt up and fwd and to balance in initial standing. Step pvt with RW from recliner to bed    Ambulation/Gait         Gait velocity: decr     General Gait Details: step pvt recliner to bed only   Stairs             Wheelchair Mobility    Modified Rankin (Stroke Patients Only)       Balance Overall balance assessment: Needs assistance Sitting-balance support: Feet supported, No upper extremity supported Sitting balance-[name]: Fair Sitting balance - Comments: poor to fair depending on pt's dizziness. pt demonstrates postural reactions that are similar to those of pt with BPPV   Standing balance support: Bilateral upper extremity supported, Reliant on assistive device for balance Standing balance-[name]: Poor Standing balance comment: heavy reliance on RW and support from therapist                            Cognition Arousal/Alertness: Awake/alert Behavior During Therapy: Impulsive, WFL for tasks assessed/performed Overall Cognitive Status: No family/caregiver present to determine baseline cognitive functioning Area of Impairment: Attention, Memory, Following commands, Safety/judgement, Awareness, Problem solving                   Current  Attention Level: Sustained Memory: Decreased recall of precautions Following Commands: Follows one step commands consistently, Follows one step commands with increased time Safety/Judgement: Decreased awareness of safety   Problem Solving: Difficulty sequencing,  Requires verbal cues, Requires tactile cues General Comments: pt with poor safety awareness and rushing/moving impulsively at times. limited due to sensation of dizziness that subsides after ~10-30 seconds.        Exercises      General Comments        Pertinent Vitals/Pain Pain Assessment Pain Assessment: 0-10 Pain Score: 4  Pain Location: Bil LE's and back Pain Descriptors / Indicators: Aching, Grimacing, Sore Pain Intervention(s): Limited activity within patient's tolerance, Monitored during session    Home Living                          Prior Function            PT Goals (current goals can now be found in the care plan section) Acute Rehab PT Goals Patient Stated Goal: Get better PT Goal Formulation: With patient Time For Goal Achievement: 03/10/22 Potential to Achieve Goals: Fair Progress towards PT goals: Progressing toward goals    Frequency    Min 3X/week      PT Plan Current plan remains appropriate;Frequency needs to be updated    Co-evaluation              AM-PAC PT "6 Clicks" Mobility   Outcome Measure  Help needed turning from your back to your side while in a flat bed without using bedrails?: A Lot Help needed moving from lying on your back to sitting on the side of a flat bed without using bedrails?: A Lot Help needed moving to and from a bed to a chair (including a wheelchair)?: A Lot Help needed standing up from a chair using your arms (e.g., wheelchair or bedside chair)?: A Lot Help needed to walk in hospital room?: Total Help needed climbing 3-5 steps with a railing? : Total 6 Click Score: 10    End of Session Equipment Utilized During Treatment: Gait belt Activity Tolerance: Patient tolerated treatment well;Patient limited by fatigue;Patient limited by pain Patient left: in bed;with call bell/phone within reach;with bed alarm set;with nursing/sitter in room Nurse Communication: Mobility status PT Visit Diagnosis:  Unsteadiness on feet (R26.81);Muscle weakness (generalized) (M62.81);Difficulty in walking, not elsewhere classified (R26.2);History of falling (Z91.81);Pain Pain - Right/Left: Right Pain - part of body: Hip     Time: 0865-7846 PT Time Calculation (min) (ACUTE ONLY): 16 min  Charges:  $Therapeutic Activity: 8-22 mins                     Debe Coder PT Acute Rehabilitation Services Pager [phone] Office [phone]    Mathews Stuhr 03/03/2022, 3:31 PM

## 2022-03-04 DIAGNOSIS — S72001A Fracture of unspecified part of neck of right femur, initial encounter for closed fracture: Secondary | ICD-10-CM | POA: Diagnosis not present

## 2022-03-04 LAB — BASIC METABOLIC PANEL
Anion gap: 9 (ref 5–15)
BUN: 26 mg/dL — ABNORMAL HIGH (ref 8–23)
CO2: 26 mmol/L (ref 22–32)
Calcium: 9 mg/dL (ref 8.9–10.3)
Chloride: 101 mmol/L (ref 98–111)
Creatinine, Ser: 0.66 mg/dL (ref 0.61–1.24)
GFR, Estimated: >60 mL/min (ref >60)
Glucose, Bld: 188 mg/dL — ABNORMAL HIGH (ref 70–99)
Potassium: 4.4 mmol/L (ref 3.5–5.1)
Sodium: 136 mmol/L (ref 135–145)

## 2022-03-04 LAB — GLUCOSE, CAPILLARY
Glucose-Capillary: 189 mg/dL — ABNORMAL HIGH (ref 70–99)
Glucose-Capillary: 234 mg/dL — ABNORMAL HIGH (ref 70–99)
Glucose-Capillary: 182 mg/dL — ABNORMAL HIGH (ref 70–99)
Glucose-Capillary: 251 mg/dL — ABNORMAL HIGH (ref 70–99)

## 2022-03-04 LAB — MAGNESIUM: Magnesium: 2.4 mg/dL (ref 1.7–2.4)

## 2022-03-04 MED ORDER — FLUDROCORTISONE ACETATE 0.1 MG PO TABS
0.2000 mg | ORAL_TABLET | Freq: Every day | ORAL | Status: DC
  Administered 2022-03-05: 0.2 mg via ORAL
  Filled 2022-03-04: qty 2

## 2022-03-04 MED ORDER — FLUDROCORTISONE ACETATE 0.1 MG PO TABS
0.1000 mg | ORAL_TABLET | Freq: Once | ORAL | Status: AC
Start: 2022-03-04 — End: 2022-03-04
  Administered 2022-03-04: 0.1 mg via ORAL
  Filled 2022-03-04: qty 1

## 2022-03-04 NOTE — Plan of Care (Signed)
Plan of care reviewed and discussed with the patient. 

## 2022-03-04 NOTE — Progress Notes (Signed)
Physical Therapy Treatment Patient Details Name: Micheal Moore. MRN: [id number] DOB: 05/06/1951 Today's Date: 03/04/2022   History of Present Illness Pt s/p fall with with R hip fx and now s/p intramedullary fixation.  Pt with hx of DM, falls, syncope, and L great toe amputation. Pt has been limited by orthostatic hypotension.    PT Comments    Pt continues cooperative but OOB activity limited by ongoing symptomatic hypotension.  Pt with c/o dizziness with standing and attempts to ambulate - pt with elevated anxiety and becoming more impulsive and unsafe with onset of symptoms.  Bp supine 131/68; sit 108/68; initial stand 108/94; after step pvt to recliner 95/49 - RN aware   Recommendations for follow up therapy are one component of a multi-disciplinary discharge planning process, led by the attending physician.  Recommendations may be updated based on patient status, additional functional criteria and insurance authorization.  Follow Up Recommendations  Skilled nursing-short term rehab (<3 hours/day)     Assistance Recommended at Discharge Frequent or constant Supervision/Assistance  Patient can return home with the following Assistance with cooking/housework;Assist for transportation;Help with stairs or ramp for entrance;Two people to help with walking and/or transfers;Two people to help with bathing/dressing/bathroom;Assistance with feeding;Direct supervision/assist for medications management   Equipment Recommendations  Rolling walker (2 wheels)    Recommendations for Other Services       Precautions / Restrictions Precautions Precautions: Fall Precaution Comments: orthostatic Restrictions Weight Bearing Restrictions: No RLE Weight Bearing: Weight bearing as tolerated     Mobility  Bed Mobility Overal bed mobility: Needs Assistance Bed Mobility: Supine to Sit     Supine to sit: Min assist     General bed mobility comments: cues for sequence and use of L LE  to self assist.  Physical assist to manage L LE    Transfers Overall transfer level: Needs assistance Equipment used: Rolling walker (2 wheels) Transfers: Bed to chair/wheelchair/BSC Sit to Stand: Min assist, Mod assist, +2 physical assistance, +2 safety/equipment, From elevated surface   Step pivot transfers: Min assist, Mod assist, +2 physical assistance, +2 safety/equipment, From elevated surface       General transfer comment: cues for LE management and use of UEs to self assist.  Physical assist to bring wt up and fwd and to balance in initial standing. Step pvt with RW from bed to recliner    Ambulation/Gait         Gait velocity: decr     General Gait Details: step pvt bed to recliner only - pt continues ltd by c/o dizziness 2* ongoing orthostatic hypotension   Stairs             Wheelchair Mobility    Modified Rankin (Stroke Patients Only)       Balance Overall balance assessment: Needs assistance Sitting-balance support: Feet supported, No upper extremity supported Sitting balance-[name]: Fair     Standing balance support: Bilateral upper extremity supported, Reliant on assistive device for balance Standing balance-[name]: Poor                              Cognition Arousal/Alertness: Awake/alert Behavior During Therapy: Flat affect Overall Cognitive Status: Within Functional Limits for tasks assessed  Exercises      General Comments        Pertinent Vitals/Pain Pain Assessment Pain Assessment: Faces Faces Pain Scale: Hurts little more Pain Location: RLE Pain Descriptors / Indicators: Aching, Grimacing, Sore, Guarding Pain Intervention(s): Limited activity within patient's tolerance, Monitored during session, Premedicated before session    Home Living                          Prior Function            PT Goals (current goals can now be  found in the care plan section) Acute Rehab PT Goals Patient Stated Goal: Get better PT Goal Formulation: With patient Time For Goal Achievement: 03/10/22 Potential to Achieve Goals: Fair Progress towards PT goals: Progressing toward goals    Frequency    Min 3X/week      PT Plan Current plan remains appropriate;Frequency needs to be updated    Co-evaluation PT/OT/SLP Co-Evaluation/Treatment: Yes Reason for Co-Treatment: For patient/therapist safety PT goals addressed during session: Mobility/safety with mobility OT goals addressed during session: ADL's and self-care      AM-PAC PT "6 Clicks" Mobility   Outcome Measure  Help needed turning from your back to your side while in a flat bed without using bedrails?: A Lot Help needed moving from lying on your back to sitting on the side of a flat bed without using bedrails?: A Lot Help needed moving to and from a bed to a chair (including a wheelchair)?: A Lot Help needed standing up from a chair using your arms (e.g., wheelchair or bedside chair)?: A Lot Help needed to walk in hospital room?: Total Help needed climbing 3-5 steps with a railing? : Total 6 Click Score: 10    End of Session Equipment Utilized During Treatment: Gait belt Activity Tolerance: Patient limited by fatigue;Patient limited by pain;Other (comment) (orthostatic) Patient left: in chair;with call bell/phone within reach;with chair alarm set Nurse Communication: Mobility status PT Visit Diagnosis: Unsteadiness on feet (R26.81);Muscle weakness (generalized) (M62.81);Difficulty in walking, not elsewhere classified (R26.2);History of falling (Z91.81);Pain Pain - Right/Left: Right Pain - part of body: Hip     Time: 0263-7858 PT Time Calculation (min) (ACUTE ONLY): 20 min  Charges:                        Debe Coder PT [place] Pager [phone] Office [phone]    [place] 03/04/2022, 1:55 PM

## 2022-03-04 NOTE — Progress Notes (Signed)
Progress Note Patient: Micheal Moore. WSF:[id number] DOB: 26-Jul-1951 DOA: 02/22/2022  DOS: the patient was seen and examined on 03/04/2022  Brief hospital course: Past medical history of type II DM, HTN, HLD, HFpEF, DVT on aspirin, recurrent falls, right arm melanoma.  Presents to the hospital with complaints of "mechanical fall found to have right intertrochanteric fracture of the hip.  Underwent intramedullary fixation of the right femur on 6/9 by Dr. Lyla Glassing. Continues to remain orthostatic  postoperatively. Assessment and Plan: Right femur intertrochanteric fracture, closed. SP intramedullary nail fixation on 6/9. Postop wound care, pain management, DVT prophylaxis and weightbearing status per orthopedics. Weightbearing as tolerated with walker. Currently on Lovenox, on discharge aspirin 81 mg twice daily for 4 weeks. PT continues to recommend SNF.  Orthostasis hypotension. Low cortisol level. Polyuria Blood pressure dropped significantly and the patient is not able to stand for full 3 minutes due to ongoing dizziness. No focal deficit. Patient is suffering from polyuria and therefore despite aggressive diuresis remains negative for ins and out balance. Etiology of the polyuria not clear. Serum osmolality is normal.  Serum sodium level normal and serum creatinine normal. Urine osmolarity 680  Unremarkable urinalysis and urine sodium 90. Continue midodrine as ordered. Stopping the IV fluid and fluid restriction did not increase the serum osmolarity. I do not think the patient has primary polydipsia. Patient is not on any medications to cause nephrogenic diabetes insipidus. Cortisol level significantly fluctuating during the morning.  Lowest cortisol level was 5.9. Cosyntropin stimulation test ordered on 6/15, delayed till 6/17.  Appears to be negative. Started on Florinef.  We will increase the dose.  0.2 mg for now. Orthostatic improving.  We will also add abdominal  binder.   Postop acute blood loss anemia, expected. Baseline hemoglobin around 12-13.  Dropped to 9 currently at 10. For now monitor.  Type 2 diabetes mellitus, uncontrolled with hyperglycemia without long-term insulin use. Hemoglobin A1c 7.2. On metformin and glimepiride at home.  Currently on sliding scale insulin.   Lantus added on 6/16, continue current regimen. Monitor.  HLD. Continue statin.  Melanoma of the right arm. Follows up with Dr. Kerin Perna.  Active smoker. Counseled to quit. Monitor.  Subjective: No nausea no vomiting no fever no chills.  Continues to have dizziness with change in position.  Physical Exam: Vitals:   03/03/22 2105 03/04/22 0603 03/04/22 1424 03/04/22 2112  BP: 136/69 135/86 136/83 (!) 146/81  Pulse: 95 (!) 104 100 98  Resp: '18 18 17 18  '$ Temp: 98 F (36.7 C) 98.6 F (37 C) 98.4 F (36.9 C) 98.5 F (36.9 C)  TempSrc: Oral Oral Oral Oral  SpO2: 100% (!) 82%  99%  Weight:      Height:       General: Appear in mild distress; no visible Abnormal Neck Mass Or lumps, Conjunctiva normal Cardiovascular: S1 and S2 Present, no Murmur, Respiratory: good respiratory effort, Bilateral Air entry present and CTA, no Crackles, no wheezes Abdomen: Bowel Sound present, Non tender Extremities: no Pedal edema Neurology: alert and oriented to time, place, and person  Gait not checked due to patient safety concerns   Data Reviewed: I have Reviewed nursing notes, Vitals, and Lab results since pt's last encounter. Pertinent lab results BMP I have ordered test including BMP    Family Communication: Discussed with wife  Disposition: Status is: Inpatient Remains inpatient appropriate because: Monitor for improvement in orthostatic.  Patient is unable to stand up for 3 minutes without feeling dizzy with  blood pressure still dropping from 456-25 systolic.  Author: Berle Mull, MD 03/04/2022 9:19 PM  Please look on [email or web address] to find out who is on call.

## 2022-03-04 NOTE — Progress Notes (Signed)
Physical Therapy Treatment Patient Details Name: Micheal Moore. MRN: [id number] DOB: 03-17-1951 Today's Date: 03/04/2022   History of Present Illness Pt s/p fall with with R hip fx and now s/p intramedullary fixation.  Pt with hx of DM, falls, syncope, and L great toe amputation. Pt has been limited by orthostatic hypotension.    PT Comments    Pt continues cooperative but ltd by symptomatic hypotension with attempts at OOB activity.  This date, pt assisted to step pvt from chair to bed and side-stepped up side of bed before transferred to supine.  Therex program completed at bed level.     Recommendations for follow up therapy are one component of a multi-disciplinary discharge planning process, led by the attending physician.  Recommendations may be updated based on patient status, additional functional criteria and insurance authorization.  Follow Up Recommendations  Skilled nursing-short term rehab (<3 hours/day)     Assistance Recommended at Discharge Frequent or constant Supervision/Assistance  Patient can return home with the following Assistance with cooking/housework;Assist for transportation;Help with stairs or ramp for entrance;Two people to help with walking and/or transfers;Two people to help with bathing/dressing/bathroom;Assistance with feeding;Direct supervision/assist for medications management   Equipment Recommendations  Rolling walker (2 wheels)    Recommendations for Other Services       Precautions / Restrictions Precautions Precautions: Fall Precaution Comments: orthostatic Restrictions Weight Bearing Restrictions: No RLE Weight Bearing: Weight bearing as tolerated     Mobility  Bed Mobility Overal bed mobility: Needs Assistance Bed Mobility: Sit to Supine     Supine to sit: Min assist Sit to supine: Min assist, Mod assist   General bed mobility comments: cues for sequence and use of L LE to self assist.  Physical assist to manage R LE and  to control trunk    Transfers Overall transfer level: Needs assistance Equipment used: Rolling walker (2 wheels) Transfers: Bed to chair/wheelchair/BSC Sit to Stand: Min assist, Mod assist, +2 physical assistance, +2 safety/equipment, From elevated surface   Step pivot transfers: Min assist, Mod assist, +2 physical assistance, +2 safety/equipment       General transfer comment: cues for LE management and use of UEs to self assist.  Physical assist to bring wt up and fwd and to balance in initial standing. Step pvt with RW from recliner to bed    Ambulation/Gait Ambulation/Gait assistance: Min assist, +2 physical assistance, +2 safety/equipment Gait Distance (Feet): 2 Feet Assistive device: Rolling walker (2 wheels) Gait Pattern/deviations: Step-to pattern, Decreased step length - right, Decreased step length - left, Shuffle, Trunk flexed Gait velocity: decr     General Gait Details: step pvt recliner to bed and able to side step up side of bed - pt continues ltd by c/o dizziness 2* ongoing orthostatic hypotension   Stairs             Wheelchair Mobility    Modified Rankin (Stroke Patients Only)       Balance Overall balance assessment: Needs assistance Sitting-balance support: Feet supported, No upper extremity supported Sitting balance-[name]: Fair     Standing balance support: Bilateral upper extremity supported, Reliant on assistive device for balance Standing balance-[name]: Poor                              Cognition Arousal/Alertness: Awake/alert Behavior During Therapy: Flat affect, Impulsive Overall Cognitive Status: Within Functional Limits for tasks assessed  Exercises General Exercises - Lower Extremity Ankle Circles/Pumps: AROM, Both, 15 reps, Supine Quad Sets: AROM, Both, 10 reps, Supine Short Arc Quad: AAROM, Right, 10 reps, Supine Heel Slides: AAROM, Right,  Supine, 20 reps Hip ABduction/ADduction: AAROM, Right, Supine, 15 reps    General Comments        Pertinent Vitals/Pain Pain Assessment Pain Assessment: Faces Faces Pain Scale: Hurts little more Pain Location: RLE Pain Descriptors / Indicators: Aching, Grimacing, Sore, Guarding Pain Intervention(s): Limited activity within patient's tolerance, Monitored during session, Premedicated before session, Ice applied    Home Living                          Prior Function            PT Goals (current goals can now be found in the care plan section) Acute Rehab PT Goals Patient Stated Goal: Get better PT Goal Formulation: With patient Time For Goal Achievement: 03/10/22 Potential to Achieve Goals: Fair Progress towards PT goals: Progressing toward goals    Frequency    Min 3X/week      PT Plan Current plan remains appropriate;Frequency needs to be updated    Co-evaluation PT/OT/SLP Co-Evaluation/Treatment: Yes Reason for Co-Treatment: For patient/therapist safety PT goals addressed during session: Mobility/safety with mobility OT goals addressed during session: ADL's and self-care      AM-PAC PT "6 Clicks" Mobility   Outcome Measure  Help needed turning from your back to your side while in a flat bed without using bedrails?: A Lot Help needed moving from lying on your back to sitting on the side of a flat bed without using bedrails?: A Lot Help needed moving to and from a bed to a chair (including a wheelchair)?: A Lot Help needed standing up from a chair using your arms (e.g., wheelchair or bedside chair)?: A Lot Help needed to walk in hospital room?: Total Help needed climbing 3-5 steps with a railing? : Total 6 Click Score: 10    End of Session Equipment Utilized During Treatment: Gait belt Activity Tolerance: Other (comment);Patient limited by fatigue Patient left: in bed;with call bell/phone within reach;with bed alarm set Nurse Communication:  Mobility status PT Visit Diagnosis: Unsteadiness on feet (R26.81);Muscle weakness (generalized) (M62.81);Difficulty in walking, not elsewhere classified (R26.2);History of falling (Z91.81);Pain Pain - Right/Left: Right Pain - part of body: Hip     Time: 4097-3532 PT Time Calculation (min) (ACUTE ONLY): 25 min  Charges:  $Therapeutic Exercise: 8-22 mins $Therapeutic Activity: 8-22 mins                     Debe Coder PT [facility] Pager [phone] Office [phone]    Micheal Moore 03/04/2022, 2:07 PM

## 2022-03-04 NOTE — Progress Notes (Signed)
Occupational Therapy Treatment Patient Details Name: Marquelle Musgrave. MRN: [id number] DOB: 04-15-1951 Today's Date: 03/04/2022   History of present illness Pt s/p fall with with R hip fx and now s/p intramedullary fixation.  Pt with hx of DM, falls, syncope, and L great toe amputation. Pt has been limited by orthostatic hypotension.   OT comments  Treatment focused on activity tolerance with ADL task. +2 co-treat for safety to monitor vitals and patient. Patient able to sit edge of bed and perform oral care and washing face with setup. Still orthostatic and symptomatic. Patient able to stand and take a couple of steps before stating he was dizzy. Also limited by RLE pain. Cont POC.    Recommendations for follow up therapy are one component of a multi-disciplinary discharge planning process, led by the attending physician.  Recommendations may be updated based on patient status, additional functional criteria and insurance authorization.    Follow Up Recommendations  Skilled nursing-short term rehab (<3 hours/day)    Assistance Recommended at Discharge Frequent or constant Supervision/Assistance  Patient can return home with the following  Two people to help with walking and/or transfers;Direct supervision/assist for medications management;Help with stairs or ramp for entrance;Assist for transportation;Direct supervision/assist for financial management;Assistance with cooking/housework;A lot of help with bathing/dressing/bathroom   Equipment Recommendations   (Defer)    Recommendations for Other Services      Precautions / Restrictions Precautions Precautions: Fall Precaution Comments: orthostatic Restrictions Weight Bearing Restrictions: No RLE Weight Bearing: Weight bearing as tolerated       Mobility Bed Mobility                    Transfers                         Balance                                           ADL either  performed or assessed with clinical judgement   ADL Overall ADL's : Needs assistance/impaired     Grooming: Set up;Oral care;Wash/dry face Grooming Details (indicate cue type and reason): able to sit edge of bed to perform grooming task                             Functional mobility during ADLs: Minimal assistance;+2 for safety/equipment;Rolling walker (2 wheels)      Extremity/Trunk Assessment Upper Extremity Assessment Upper Extremity Assessment: Overall WFL for tasks assessed   Lower Extremity Assessment Lower Extremity Assessment: Defer to PT evaluation   Cervical / Trunk Assessment Cervical / Trunk Assessment: Normal    Vision Patient Visual Report: No change from baseline     Perception     Praxis      Cognition Arousal/Alertness: Awake/alert Behavior During Therapy: Flat affect Overall Cognitive Status: Within Functional Limits for tasks assessed                                          Exercises      Shoulder Instructions       General Comments      Pertinent Vitals/ Pain       Pain Assessment Pain Assessment: Faces Faces  Pain Scale: Hurts little more Pain Location: RLE Pain Descriptors / Indicators: Aching, Grimacing, Sore, Guarding Pain Intervention(s): Monitored during session  Home Living                                          Prior Functioning/Environment              Frequency  Min 2X/week        Progress Toward Goals  OT Goals(current goals can now be found in the care plan section)  Progress towards OT goals: Not progressing toward goals - comment (orthostatic and pain)  Acute Rehab OT Goals Patient Stated Goal: didn't state OT Goal Formulation: Patient unable to participate in goal setting Time For Goal Achievement: 03/10/22 Potential to Achieve Goals: Good  Plan Discharge plan remains appropriate    Co-evaluation    PT/OT/SLP Co-Evaluation/Treatment: Yes Reason for  Co-Treatment: For patient/therapist safety   OT goals addressed during session: ADL's and self-care      AM-PAC OT "6 Clicks" Daily Activity     Outcome Measure   Help from another person eating meals?: None Help from another person taking care of personal grooming?: A Little Help from another person toileting, which includes using toliet, bedpan, or urinal?: Total Help from another person bathing (including washing, rinsing, drying)?: A Lot Help from another person to put on and taking off regular upper body clothing?: A Little Help from another person to put on and taking off regular lower body clothing?: A Lot 6 Click Score: 15    End of Session Equipment Utilized During Treatment: Gait belt;Rolling walker (2 wheels)  OT Visit Diagnosis: Unsteadiness on feet (R26.81);Other abnormalities of gait and mobility (R26.89);Muscle weakness (generalized) (M62.81);Pain Pain - Right/Left: Right Pain - part of body: Leg   Activity Tolerance Patient limited by pain;Other (comment) (orthostatic)   Patient Left in chair;with chair alarm set   Nurse Communication Mobility status (BPs)        Time: 8366-2947 OT Time Calculation (min): 17 min  Charges: OT General Charges $OT Visit: 1 Visit OT Treatments $Self Care/Home Management : 8-22 mins  Derl Barrow, OTR/L [place]  Office [phone] Pager: [place] 03/04/2022, 12:27 PM

## 2022-03-05 DIAGNOSIS — Z130 Encounter for screening for diseases of the blood and blood-forming organs and certain disorders involving the immune mechanism: Secondary | ICD-10-CM | POA: Diagnosis not present

## 2022-03-05 DIAGNOSIS — I152 Hypertension secondary to endocrine disorders: Secondary | ICD-10-CM | POA: Diagnosis not present

## 2022-03-05 DIAGNOSIS — E2749 Other adrenocortical insufficiency: Secondary | ICD-10-CM | POA: Diagnosis not present

## 2022-03-05 DIAGNOSIS — R5381 Other malaise: Secondary | ICD-10-CM | POA: Diagnosis not present

## 2022-03-05 DIAGNOSIS — D62 Acute posthemorrhagic anemia: Secondary | ICD-10-CM | POA: Diagnosis not present

## 2022-03-05 DIAGNOSIS — L219 Seborrheic dermatitis, unspecified: Secondary | ICD-10-CM | POA: Diagnosis not present

## 2022-03-05 DIAGNOSIS — Z85820 Personal history of malignant melanoma of skin: Secondary | ICD-10-CM | POA: Diagnosis not present

## 2022-03-05 DIAGNOSIS — Z131 Encounter for screening for diabetes mellitus: Secondary | ICD-10-CM | POA: Diagnosis not present

## 2022-03-05 DIAGNOSIS — E119 Type 2 diabetes mellitus without complications: Secondary | ICD-10-CM | POA: Diagnosis not present

## 2022-03-05 DIAGNOSIS — S72001A Fracture of unspecified part of neck of right femur, initial encounter for closed fracture: Secondary | ICD-10-CM | POA: Diagnosis not present

## 2022-03-05 DIAGNOSIS — D6859 Other primary thrombophilia: Secondary | ICD-10-CM | POA: Diagnosis not present

## 2022-03-05 DIAGNOSIS — I951 Orthostatic hypotension: Secondary | ICD-10-CM | POA: Diagnosis not present

## 2022-03-05 DIAGNOSIS — E1159 Type 2 diabetes mellitus with other circulatory complications: Secondary | ICD-10-CM | POA: Diagnosis not present

## 2022-03-05 DIAGNOSIS — Z139 Encounter for screening, unspecified: Secondary | ICD-10-CM | POA: Diagnosis not present

## 2022-03-05 DIAGNOSIS — S72001D Fracture of unspecified part of neck of right femur, subsequent encounter for closed fracture with routine healing: Secondary | ICD-10-CM | POA: Diagnosis not present

## 2022-03-05 DIAGNOSIS — E785 Hyperlipidemia, unspecified: Secondary | ICD-10-CM | POA: Diagnosis not present

## 2022-03-05 DIAGNOSIS — M2550 Pain in unspecified joint: Secondary | ICD-10-CM | POA: Diagnosis not present

## 2022-03-05 DIAGNOSIS — Z79899 Other long term (current) drug therapy: Secondary | ICD-10-CM | POA: Diagnosis not present

## 2022-03-05 DIAGNOSIS — D649 Anemia, unspecified: Secondary | ICD-10-CM | POA: Diagnosis not present

## 2022-03-05 DIAGNOSIS — R3589 Other polyuria: Secondary | ICD-10-CM | POA: Diagnosis not present

## 2022-03-05 DIAGNOSIS — S72141D Displaced intertrochanteric fracture of right femur, subsequent encounter for closed fracture with routine healing: Secondary | ICD-10-CM | POA: Diagnosis not present

## 2022-03-05 DIAGNOSIS — Y92480 Sidewalk as the place of occurrence of the external cause: Secondary | ICD-10-CM | POA: Diagnosis not present

## 2022-03-05 DIAGNOSIS — W010XXA Fall on same level from slipping, tripping and stumbling without subsequent striking against object, initial encounter: Secondary | ICD-10-CM | POA: Diagnosis not present

## 2022-03-05 DIAGNOSIS — R000 Tachycardia, unspecified: Secondary | ICD-10-CM | POA: Diagnosis not present

## 2022-03-05 DIAGNOSIS — M769 Unspecified enthesopathy, lower limb, excluding foot: Secondary | ICD-10-CM | POA: Diagnosis not present

## 2022-03-05 DIAGNOSIS — Z86718 Personal history of other venous thrombosis and embolism: Secondary | ICD-10-CM | POA: Diagnosis not present

## 2022-03-05 DIAGNOSIS — C439 Malignant melanoma of skin, unspecified: Secondary | ICD-10-CM | POA: Diagnosis not present

## 2022-03-05 DIAGNOSIS — E0800 Diabetes mellitus due to underlying condition with hyperosmolarity without nonketotic hyperglycemic-hyperosmolar coma (NKHHC): Secondary | ICD-10-CM | POA: Diagnosis not present

## 2022-03-05 DIAGNOSIS — E1169 Type 2 diabetes mellitus with other specified complication: Secondary | ICD-10-CM | POA: Diagnosis not present

## 2022-03-05 DIAGNOSIS — Z23 Encounter for immunization: Secondary | ICD-10-CM | POA: Diagnosis not present

## 2022-03-05 DIAGNOSIS — Z7401 Bed confinement status: Secondary | ICD-10-CM | POA: Diagnosis not present

## 2022-03-05 DIAGNOSIS — R296 Repeated falls: Secondary | ICD-10-CM | POA: Diagnosis not present

## 2022-03-05 DIAGNOSIS — I5032 Chronic diastolic (congestive) heart failure: Secondary | ICD-10-CM | POA: Diagnosis not present

## 2022-03-05 DIAGNOSIS — Z4789 Encounter for other orthopedic aftercare: Secondary | ICD-10-CM | POA: Diagnosis not present

## 2022-03-05 LAB — MAGNESIUM: Magnesium: 2.3 mg/dL (ref 1.7–2.4)

## 2022-03-05 LAB — GLUCOSE, CAPILLARY
Glucose-Capillary: 244 mg/dL — ABNORMAL HIGH (ref 70–99)
Glucose-Capillary: 214 mg/dL — ABNORMAL HIGH (ref 70–99)

## 2022-03-05 MED ORDER — GLUCERNA SHAKE PO LIQD: 237.0000 mL | Freq: Three times a day (TID) | ORAL | 0 refills | Status: DC

## 2022-03-05 MED ORDER — FLUDROCORTISONE ACETATE 0.1 MG PO TABS: 0.2000 mg | ORAL_TABLET | Freq: Every day | ORAL | 0 refills | Status: DC

## 2022-03-05 MED ORDER — DOCUSATE SODIUM 100 MG PO CAPS: 100.0000 mg | ORAL_CAPSULE | Freq: Two times a day (BID) | ORAL | 0 refills | Status: DC

## 2022-03-05 MED ORDER — POLYETHYLENE GLYCOL 3350 17 G PO PACK: 17.0000 g | PACK | Freq: Every day | ORAL | 0 refills | Status: DC

## 2022-03-05 MED ORDER — MIDODRINE HCL 5 MG PO TABS: 5.0000 mg | ORAL_TABLET | Freq: Three times a day (TID) | ORAL | 0 refills | Status: DC

## 2022-03-05 MED ORDER — SITAGLIPTIN PHOSPHATE 50 MG PO TABS: 50.0000 mg | ORAL_TABLET | Freq: Every day | ORAL | 0 refills | Status: DC

## 2022-03-05 MED ORDER — NICOTINE 14 MG/24HR TD PT24: 14.0000 mg | MEDICATED_PATCH | Freq: Every day | TRANSDERMAL | 0 refills | Status: DC

## 2022-03-05 NOTE — Discharge Summary (Addendum)
Physician Discharge Summary   Patient: Micheal Moore. MRN: [id number] DOB: [birth year]  Admit date:     02/22/2022  Discharge date: 03/05/22  Discharge Physician: Berle Mull  PCP: [name], MD  Recommendations at discharge:  Follow up with Orthopedics as recommended  Follow up with PCP in 1 week Repeat CBC and BMP in 1 week  Discharge Diagnoses: Principal Problem:   Hip fracture ([place]) Active Problems:   History of DVT (deep vein thrombosis)   Hyperlipidemia   Type 2 diabetes mellitus with hyperlipidemia (HCC)   Tobacco abuse   Chronic diastolic CHF (congestive heart failure) ([employer])   Fall   Orthostatic hypotension  Hospital Course: Past medical history of type II DM, HTN, HLD, HFpEF, DVT on aspirin, recurrent falls, right arm melanoma.  Presents to the hospital with complaints of "mechanical fall found to have right intertrochanteric fracture of the hip.  Underwent intramedullary fixation of the right femur on 6/9 by Dr. Lyla Glassing. Continues to remain orthostatic  postoperatively.  Assessment and Plan: Right femur intertrochanteric fracture, closed. SP intramedullary nail fixation on 6/9. Postop wound care, pain management, DVT prophylaxis and weightbearing status per orthopedics. Weightbearing as tolerated with walker. Currently on Lovenox, on discharge aspirin 81 mg twice daily for 4 weeks. PT continues to recommend SNF.  Orthostasis hypotension. Low cortisol level. Polyuria Blood pressure drops significantly on standing  No focal deficit. Etiology of the polyuria not clear. Serum osmolality is normal.  Serum sodium level normal and serum creatinine normal. Urine osmolarity 680  Unremarkable urinalysis and urine sodium 90. Continue midodrine as ordered. Stopping the IV fluid and fluid restriction did not increase the serum osmolarity. I do not think the patient has primary polydipsia. Patient is not on any medications to cause diabetes  insipidus. Cortisol level significantly fluctuating during the morning. Lowest cortisol level was 5.9. Cosyntropin stimulation test ordered on 6/15, delayed till 6/17.  Appears to be negative. Started on Florinef. Continue 0.2 mg for now. Orthostatic improving.  We will also add abdominal binder.   Postop acute blood loss anemia, expected. Baseline hemoglobin around 12-13.  Dropped to 9 currently at 10. For now monitor.  Type 2 diabetes mellitus, uncontrolled with hyperglycemia without long-term insulin use. Hemoglobin A1c 7.2. On metformin and glimepiride at home.   Recommend to continue sliding scale insulin as SNF Add januvia to glimepiride and metformin  HLD. Continue statin.  Melanoma of the right arm. Follows up with Dr. Kerin Perna.  Active smoker. Counseled to quit. Monitor.  Pain control - [employer] Controlled Substance Reporting System database was reviewed. and patient was instructed, not to drive, operate heavy machinery, perform activities at heights, swimming or participation in water activities or provide baby-sitting services while on Pain, Sleep and Anxiety Medications; until their outpatient Physician has advised to do so again. Also recommended to not to take more than prescribed Pain, Sleep and Anxiety Medications.   Consultants: Orthopedics  Procedures performed:  Intramedullary fixation, Right femur.  DISCHARGE MEDICATION: Allergies as of 03/05/2022   No Known Allergies      Medication List     STOP taking these medications    aspirin 325 MG tablet Replaced by: aspirin 81 MG chewable tablet   HYDROcodone-acetaminophen 5-325 MG tablet Commonly known as: NORCO/VICODIN Replaced by: HYDROcodone-acetaminophen 10-325 MG tablet       TAKE these medications    aspirin 81 MG chewable tablet Commonly known as: Aspirin Childrens Chew 1 tablet (81 mg total) by mouth 2 (two) times  daily with a meal. Replaces: aspirin 325 MG tablet   docusate sodium  100 MG capsule Commonly known as: Colace Take 1 capsule (100 mg total) by mouth 2 (two) times daily.   feeding supplement (GLUCERNA SHAKE) Liqd Take 237 mLs by mouth 3 (three) times daily between meals.   fludrocortisone 0.1 MG tablet Commonly known as: FLORINEF Take 2 tablets (0.2 mg total) by mouth daily.   glimepiride 2 MG tablet Commonly known as: AMARYL Take 2 mg by mouth daily.   HYDROcodone-acetaminophen 10-325 MG tablet Commonly known as: Norco Take 0.5 tablets by mouth every 4 (four) hours as needed for up to 7 days for moderate pain or severe pain. Replaces: HYDROcodone-acetaminophen 5-325 MG tablet   metFORMIN 500 MG tablet Commonly known as: GLUCOPHAGE Take 2 tablets (1,000 mg total) by mouth 2 (two) times daily.   midodrine 5 MG tablet Commonly known as: PROAMATINE Take 1 tablet (5 mg total) by mouth 3 (three) times daily with meals.   nicotine 14 mg/24hr patch Commonly known as: NICODERM CQ - dosed in mg/24 hours Place 1 patch (14 mg total) onto the skin daily.   polyethylene glycol 17 g packet Commonly known as: MIRALAX / GLYCOLAX Take 17 g by mouth daily.   rosuvastatin 5 MG tablet Commonly known as: CRESTOR Take 5 mg by mouth See admin instructions. Take two times weekly. Take one tablet by mouth on Tuesday & Saturdays only per patient   sitaGLIPtin 50 MG tablet Commonly known as: Januvia Take 1 tablet (50 mg total) by mouth daily.               Discharge Care Instructions  (From admission, onward)           Start     Ordered   03/05/22 0000  Leave dressing on - Keep it clean, dry, and intact until clinic visit        03/05/22 0745            Contact information for follow-up providers     Swinte[place], Aaron Edelman, MD. Schedule an appointment as soon as possible for a visit in 2 week(s).   Specialty: Orthopedic Surgery Why: For wound re-check, For suture removal Contact information: [address] [place]  [address] [phone]              Contact information for after-discharge care     Destination     [facility] .   Service: Skilled Nursing Contact information: [address] [place] [phone]                    Disposition: SNF Diet recommendation: Regular diet  Discharge Exam: Vitals:   03/04/22 0603 03/04/22 1424 03/04/22 2112 03/05/22 0653  BP: 135/86 136/83 (!) 146/81 (!) 151/88  Pulse: (!) 104 100 98 98  Resp: '18 17 18 18  '$ Temp: 98.6 F (37 C) 98.4 F (36.9 C) 98.5 F (36.9 C) 98.4 F (36.9 C)  TempSrc: Oral Oral Oral Oral  SpO2: (!) 82%  99% 99%  Weight:      Height:       General: Appear in no distress; no visible Abnormal Neck Mass Or lumps, Conjunctiva normal Cardiovascular: S1 and S2 Present, no Murmur, Respiratory: good respiratory effort, Bilateral Air entry present and CTA, no Crackles, no wheezes Abdomen: Bowel Sound present, Non tender  Extremities: no Pedal edema Neurology: alert and oriented to time, place, and person  Gait not checked  due to patient safety concerns Filed Weights   02/22/22 1614 02/22/22 2203 02/23/22 0804  Weight: 95.3 kg 88.5 kg 88.5 kg   Condition at discharge: stable  The results of significant diagnostics from this hospitalization (including imaging, microbiology, ancillary and laboratory) are listed below for reference.   Imaging Studies: Pelvis Portable  Result Date: 02/23/2022 CLINICAL DATA:  Post ORIF RIGHT hip EXAM: PORTABLE PELVIS 1-2 VIEWS COMPARISON:  Portable exam 1231 hours compared to 02/22/2022 FINDINGS: IM nail with compression screw across a reduced intertrochanteric fracture of the RIGHT femur. Mildly displaced lesser trochanteric fragment. Diffuse osseous demineralization. Narrowing of hip joints bilaterally. Visualized pelvis intact. IMPRESSION: Post ORIF intertrochanteric fracture RIGHT femur. Electronically Signed   By: Lavonia Dana M.D.   On: 02/23/2022  12:44   DG HIP UNILAT WITH PELVIS 1V RIGHT  Result Date: 02/23/2022 CLINICAL DATA:  Right intramedullary nail EXAM: DG HIP (WITH OR WITHOUT PELVIS) 1V RIGHT COMPARISON:  02/22/2022 FINDINGS: Three fluoroscopic images are obtained during the performance of the procedure and are provided for interpretation only. Images demonstrate intramedullary nail fixation of the previously noted intertrochanteric fracture, with near anatomic alignment of the fracture components. No additional acute osseous abnormality. Fluoroscopy time: 28 seconds 4.3 mGy IMPRESSION: Expected intraoperative appearance of intramedullary nail fixation of an intertrochanteric fracture. Electronically Signed   By: Merilyn Baba M.D.   On: 02/23/2022 12:01   DG C-Arm 1-60 Min-No Report  Result Date: 02/23/2022 Fluoroscopy was utilized by the requesting physician.  No radiographic interpretation.   DG Knee Right Port  Result Date: 02/22/2022 CLINICAL DATA:  Intratrochanteric fracture. EXAM: PORTABLE RIGHT KNEE - 1-2 VIEW COMPARISON:  None Available. FINDINGS: No evidence of fracture, dislocation, or joint effusion. There are mild degenerative changes of the medial compartment. There is prepatellar soft tissue swelling. IMPRESSION: 1. No acute bony abnormality. 2. Prepatellar soft tissue swelling. 3. Mild degenerative changes. Electronically Signed   By: Ronney Asters M.D.   On: 02/22/2022 23:20   DG Chest 2 View  Result Date: 02/22/2022 CLINICAL DATA:  Known right hip fracture, preoperative evaluation EXAM: CHEST - 2 VIEW COMPARISON:  01/21/2021 FINDINGS: Cardiac shadow is within normal limits. The lungs are well aerated bilaterally. No focal infiltrate or effusion is seen. No acute bony abnormality is noted. IMPRESSION: No acute abnormality noted. Electronically Signed   By: Inez Catalina M.D.   On: 02/22/2022 19:02   DG Hip Unilat W or Wo Pelvis 2-3 Views Right  Result Date: 02/22/2022 CLINICAL DATA:  Fall.  Right leg short and rotated.  EXAM: DG HIP (WITH OR WITHOUT PELVIS) 2-3V RIGHT COMPARISON:  None Available. FINDINGS: There is diffuse decreased bone mineralization. There is a comminuted fracture of the right proximal femoral intertrochanteric region with mild-to-moderate diastasis and mild varus angulation. Mild bilateral femoroacetabular joint space narrowing. The pubic symphysis and right sacroiliac joint spaces are maintained. IMPRESSION: Mildly displaced and angulated, comminuted right intertrochanteric acute fracture. Electronically Signed   By: Yvonne Kendall M.D.   On: 02/22/2022 17:26   CT CERVICAL SPINE WO CONTRAST  Result Date: 02/22/2022 CLINICAL DATA:  Chronic neck pain, worse after a fall today. EXAM: CT CERVICAL SPINE WITHOUT CONTRAST TECHNIQUE: Multidetector CT imaging of the cervical spine was performed without intravenous contrast. Multiplanar CT image reconstructions were also generated. RADIATION DOSE REDUCTION: This exam was performed according to the departmental dose-optimization program which includes automated exposure control, adjustment of the mA and/or kV according to patient size and/or use of iterative reconstruction technique. COMPARISON:  01/21/2021 FINDINGS: Alignment: Imaging through the bottom of T1. Maintenance of vertebral body height. Straightening of expected lordosis. Skull base and vertebrae: Skull base intact. Coronal reformats demonstrate a normal C1-C2 articulation. Facet arthropathy is most significant on the right at C4-5 and on the left at C2-3. Soft tissues and spinal canal: Bilateral carotid atherosclerosis. Disc levels: Loss of intervertebral disc height is most significant at C6-7. C2-3 central canal and neural foraminal narrowing secondary to uncovertebral joint hypertrophy and disc osteophyte complex. Similar findings at C3-4, including eccentric right disc osteophyte complex. At C4-5, significant right-sided neural foraminal narrowing secondary to uncovertebral joint hypertrophy. At C5-6,  central canal and neural foraminal narrowing secondary to above factors. Similar findings at C6-7 and less so C7-T1. Upper chest: No apical pneumothorax. Other: None. IMPRESSION: No acute fracture or subluxation within the cervical spine. Significant cervical spondylosis, with multilevel central canal and neural foraminal narrowing as detailed above. Nonspecific straightening of expected cervical lordosis. Electronically Signed   By: Abigail Miyamoto M.D.   On: 02/22/2022 17:09    Microbiology: Results for orders placed or performed during the hospital encounter of 02/22/22  Surgical PCR screen     Status: None   Collection Time: 02/22/22 10:59 PM   Specimen: Nasal Mucosa; Nasal Swab  Result Value Ref Range Status   MRSA, PCR NEGATIVE NEGATIVE Final   Staphylococcus aureus NEGATIVE NEGATIVE Final    Comment: (NOTE) The Xpert SA Assay (FDA approved for NASAL specimens in patients 55 years of age and older), is one component of a comprehensive surveillance program. It is not intended to diagnose infection nor to guide or monitor treatment. Performed at [facility], [place] [address], [place], [place] [address]   Resp Panel by RT-PCR (Flu A&B, Covid) Anterior Nasal Swab     Status: None   Collection Time: 02/22/22 11:14 PM   Specimen: Anterior Nasal Swab  Result Value Ref Range Status   SARS Coronavirus 2 by RT PCR NEGATIVE NEGATIVE Final    Comment: (NOTE) SARS-CoV-2 target nucleic acids are NOT DETECTED.  The SARS-CoV-2 RNA is generally detectable in upper respiratory specimens during the acute phase of infection. The lowest concentration of SARS-CoV-2 viral copies this assay can detect is 138 copies/mL. A negative result does not preclude SARS-Cov-2 infection and should not be used as the sole basis for treatment or other patient management decisions. A negative result may occur with  improper specimen collection/handling, submission of specimen other than nasopharyngeal  swab, presence of viral mutation(s) within the areas targeted by this assay, and inadequate number of viral copies(<138 copies/mL). A negative result must be combined with clinical observations, patient history, and epidemiological information. The expected result is Negative.  Fact Sheet for Patients:  EntrepreneurPulse.com.au  Fact Sheet for Healthcare Providers:  IncredibleEmployment.be  This test is no t yet approved or cleared by the [place] FDA and  has been authorized for detection and/or diagnosis of SARS-CoV-2 by FDA under an Emergency Use Authorization (EUA). This EUA will remain  in effect (meaning this test can be used) for the duration of the COVID-19 declaration under Section 564(b)(1) of the Act, 21 U.S.C.section 360bbb-3(b)(1), unless the authorization is terminated  or revoked sooner.       Influenza A by PCR NEGATIVE NEGATIVE Final   Influenza B by PCR NEGATIVE NEGATIVE Final    Comment: (NOTE) The Xpert Xpress SARS-CoV-2/FLU/RSV plus assay is intended as an aid in the diagnosis of influenza from Nasopharyngeal swab specimens and should  not be used as a sole basis for treatment. Nasal washings and aspirates are unacceptable for Xpert Xpress SARS-CoV-2/FLU/RSV testing.  Fact Sheet for Patients: EntrepreneurPulse.com.au  Fact Sheet for Healthcare Providers: IncredibleEmployment.be  This test is not yet approved or cleared by the [place] FDA and has been authorized for detection and/or diagnosis of SARS-CoV-2 by FDA under an Emergency Use Authorization (EUA). This EUA will remain in effect (meaning this test can be used) for the duration of the COVID-19 declaration under Section 564(b)(1) of the Act, 21 U.S.C. section 360bbb-3(b)(1), unless the authorization is terminated or revoked.  Performed at [facility], [place] [address], [place], [place] [address]     Labs: CBC: Recent Labs  Lab 02/27/22 0417 03/01/22 0445 03/02/22 0357 03/03/22 0520  WBC 7.0 7.2 8.0 6.9  HGB 9.2* 9.4* 10.3* 10.0*  HCT 28.7* 28.7* 32.2* 31.0*  MCV 99.3 98.3 98.2 97.5  PLT 205 225 241 419   Basic Metabolic Panel: Recent Labs  Lab 02/27/22 0417 03/01/22 0445 03/02/22 0357 03/03/22 0520 03/04/22 0424 03/05/22 0345  NA 137 135 136 134* 136  --   K 4.7 4.6 4.9 4.6 4.4  --   CL 106 105 100 101 101  --   CO2 '26 24 28 26 26  '$ --   GLUCOSE 176* 213* 217* 201* 188*  --   BUN 27* 23 24* 30* 26*  --   CREATININE 0.74 0.65 0.70 0.74 0.66  --   CALCIUM 8.9 8.6* 8.8* 8.9 9.0  --   MG  --  2.0 2.3 2.3 2.4 2.3   Liver Function Tests: No results for input(s): "AST", "ALT", "ALKPHOS", "BILITOT", "PROT", "ALBUMIN" in the last 168 hours. CBG: Recent Labs  Lab 03/04/22 0719 03/04/22 1118 03/04/22 1638 03/04/22 2117 03/05/22 0757  GLUCAP 189* 234* 182* 251* 244*    Discharge time spent: greater than 30 minutes.  Signed: Berle Mull, MD [facility]

## 2022-03-05 NOTE — Progress Notes (Signed)
Attempted to call report to [facility] [phone] but RN not available to answer. Nursing station number left in packet.

## 2022-03-05 NOTE — TOC Transition Note (Signed)
Transition of Care [facility]) - CM/SW Discharge Note   Patient Details  Name: Micheal Moore. MRN: [id number] Date of Birth: [birth year]  [facility] of [facility]) CM/SW Contact:  Ross Ludwig, LCSW Phone Number: 03/05/2022, 11:40 AM   Clinical Narrative:     CSW was informed that patient is ready for discharge today.  Insurance authorization is valid through today 03/05/22.  CSW spoke to [employer] at [facility], she can accept patient today.  CSW updated patient's wife, bedside nurse, and physician.  Patient to be d/c'ed today to [facility].  Patient and family agreeable to plans will transport via ems RN to call report to [phone].  Patient's wife requested EMS transport and is aware that insurance will be billed.  Final next level of care: Skilled Nursing Facility Barriers to Discharge: Barriers Resolved   Patient Goals and CMS Choice Patient states their goals for this hospitalization and ongoing recovery are:: To go to SNF for rehab, then return back home. CMS Medicare.gov Compare Post Acute Care list provided to:: Patient Represenative (must comment) Choice offered to / list presented to : Spouse  Discharge Placement PASRR number recieved: 02/24/22 Existing PASRR number confirmed : 02/24/22          Patient chooses bed at: [facility] Patient to be transferred to facility by: [facility] Name of family member notified: Patient's wife [name] Patient and family notified of of transfer: 03/05/22  Discharge Plan and Services     Post Acute Care Choice: [place]                               Social Determinants of Health (SDOH) Interventions     Readmission Risk Interventions     No data to display

## 2022-03-07 DIAGNOSIS — D6859 Other primary thrombophilia: Secondary | ICD-10-CM | POA: Diagnosis not present

## 2022-03-07 DIAGNOSIS — E1159 Type 2 diabetes mellitus with other circulatory complications: Secondary | ICD-10-CM | POA: Diagnosis not present

## 2022-03-07 DIAGNOSIS — E2749 Other adrenocortical insufficiency: Secondary | ICD-10-CM | POA: Diagnosis not present

## 2022-03-07 DIAGNOSIS — E785 Hyperlipidemia, unspecified: Secondary | ICD-10-CM | POA: Diagnosis not present

## 2022-03-07 DIAGNOSIS — D62 Acute posthemorrhagic anemia: Secondary | ICD-10-CM | POA: Diagnosis not present

## 2022-03-07 DIAGNOSIS — C439 Malignant melanoma of skin, unspecified: Secondary | ICD-10-CM | POA: Diagnosis not present

## 2022-03-07 DIAGNOSIS — I951 Orthostatic hypotension: Secondary | ICD-10-CM | POA: Diagnosis not present

## 2022-03-07 DIAGNOSIS — I152 Hypertension secondary to endocrine disorders: Secondary | ICD-10-CM | POA: Diagnosis not present

## 2022-03-07 DIAGNOSIS — S72001A Fracture of unspecified part of neck of right femur, initial encounter for closed fracture: Secondary | ICD-10-CM | POA: Diagnosis not present

## 2022-03-07 DIAGNOSIS — L219 Seborrheic dermatitis, unspecified: Secondary | ICD-10-CM | POA: Diagnosis not present

## 2022-03-07 DIAGNOSIS — R3589 Other polyuria: Secondary | ICD-10-CM | POA: Diagnosis not present

## 2022-03-10 NOTE — Progress Notes (Deleted)
Cardiology Office Note   Date:  03/10/2022   ID:  Micheal Moore., DOB 1950-12-10, MRN [id number]  PCP:  [name], MD  Cardiologist:   Dorris Carnes, MD   Patient referred for dizziness and syncope      History of Present Illness: Micheal Moore. is a 71 y.o. male with a history of dizziness and one episode of syncope   Started about 1 year ago   Denies illness priort    He says he will have 1 to 2 spells per week  When not having spells he feels OK      He says on days dizzy he can get dizzy several times    Syncopal spell occurred when bent down to pick up a dog   Passes out      Denies numbness in limbs  Bowels moving OK   No nausea     Not cold/hot  Says he drinks fluids      I saw the pt in March2023       No outpatient medications have been marked as taking for the 03/13/22 encounter (Appointment) with Fay Records, MD.     Allergies:   Patient has no known allergies.   Past Medical History:  Diagnosis Date   Cancer ([place])    right elbow melanoma   Diabetes mellitus without complication ([place])    Type II   Dizziness    DVT (deep venous thrombosis) (H[facility])    right leg   Frequent falls    Near syncope    Syncope     Past Surgical History:  Procedure Laterality Date   AMPUTATION TOE Left 04/28/2020   Procedure: AMPUTATION LEFT GREAT TOE;  Surgeon: Newt Minion, MD;  Location: [facility];  Service: Orthopedics;  Laterality: Left;   INTRAMEDULLARY (IM) NAIL INTERTROCHANTERIC Right 02/23/2022   Procedure: INTRAMEDULLARY (IM) NAIL INTERTROCHANTRIC, RIGHT;  Surgeon: Rod Can, MD;  Location: [facility];  Service: Orthopedics;  Laterality: Right;   MELANOMA EXCISION WITH SENTINEL LYMPH NODE BIOPSY Right 08/19/2020   Procedure: WIDE LOCAL EXCISION RIGHT ELBOW MELANOMA WITH RIGHT AXILLARY SENTINEL LYMPH NODE BIOPSY;  Surgeon: Dwan Bolt, MD;  Location: [facility];  Service: General;  Laterality: Right;  2nd incision in axilla     Social History:   The patient  reports that he has been smoking cigarettes. He has a 3.80 pack-year smoking history. He has never used smokeless tobacco. He reports that he does not currently use drugs. He reports that he does not drink alcohol.   Family History:  The patient's family history includes Diabetes in his father and sister; Stroke in his mother and sister.    ROS:  Please see the history of present illness. All other systems are reviewed and  Negative to the above problem except as noted.    PHYSICAL EXAM: VS:  There were no vitals taken for this visit.   BP 134/80  P 91   Sitting 146/88  P 92   Standing 122/64  )P 98   Standing 4 min  134/70  P 116   GEN: Well nourished, well developed, in no acute distress  HEENT: normal  Neck: no JVD, carotid bruits, or masses Cardiac: RRR; no murmurs, rubs, or gallops,no edema  Respiratory:  clear to auscultation bilaterally, normal work of breathing GI: soft, nontender, nondistended, + BS  No hepatomegaly  MS: no deformity Moving all extremities   Skin: warm and dry, no rash Neuro:  Strength and sensation are intact Psych: euthymic mood, full affect   EKG:  EKG is ordered today.  SR 92         Lipid Panel No results found for: "CHOL", "TRIG", "HDL", "CHOLHDL", "VLDL", "LDLCALC", "LDLDIRECT"    Wt Readings from Last 3 Encounters:  02/23/22 195 lb 1.7 oz (88.5 kg)  12/04/21 196 lb (88.9 kg)  11/07/21 195 lb 9.6 oz (88.7 kg)      ASSESSMENT AND PLAN:  1  Dizzy/ syncope    Sounds orthostatic   Encouraged him to increase salt and water  Consider support sock Will check CBC, BMET , UA    ( for SG) Set up for echo      2 DM   On oral agents     F/U in June      Current medicines are reviewed at length with the patient today.  The patient does not have concerns regarding medicines.  Signed, Dorris Carnes, MD  03/10/2022 11:05 PM    [facility], [place], [place]  [address] Phone: [phone]; Fax:  [phone]

## 2022-03-12 DIAGNOSIS — S72141D Displaced intertrochanteric fracture of right femur, subsequent encounter for closed fracture with routine healing: Secondary | ICD-10-CM | POA: Diagnosis not present

## 2022-03-13 ENCOUNTER — Ambulatory Visit: Payer: Medicare HMO | Admitting: Internal Medicine

## 2022-03-17 DIAGNOSIS — I951 Orthostatic hypotension: Secondary | ICD-10-CM | POA: Diagnosis not present

## 2022-03-17 DIAGNOSIS — Z4789 Encounter for other orthopedic aftercare: Secondary | ICD-10-CM | POA: Diagnosis not present

## 2022-03-17 DIAGNOSIS — Z86718 Personal history of other venous thrombosis and embolism: Secondary | ICD-10-CM | POA: Diagnosis not present

## 2022-03-17 DIAGNOSIS — D649 Anemia, unspecified: Secondary | ICD-10-CM | POA: Diagnosis not present

## 2022-03-17 DIAGNOSIS — I5032 Chronic diastolic (congestive) heart failure: Secondary | ICD-10-CM | POA: Diagnosis not present

## 2022-03-17 DIAGNOSIS — Z85820 Personal history of malignant melanoma of skin: Secondary | ICD-10-CM | POA: Diagnosis not present

## 2022-03-17 DIAGNOSIS — E785 Hyperlipidemia, unspecified: Secondary | ICD-10-CM | POA: Diagnosis not present

## 2022-03-17 DIAGNOSIS — R296 Repeated falls: Secondary | ICD-10-CM | POA: Diagnosis not present

## 2022-03-17 DIAGNOSIS — E1169 Type 2 diabetes mellitus with other specified complication: Secondary | ICD-10-CM | POA: Diagnosis not present

## 2022-03-19 DIAGNOSIS — S72001D Fracture of unspecified part of neck of right femur, subsequent encounter for closed fracture with routine healing: Secondary | ICD-10-CM | POA: Diagnosis not present

## 2022-03-19 DIAGNOSIS — M769 Unspecified enthesopathy, lower limb, excluding foot: Secondary | ICD-10-CM | POA: Diagnosis not present

## 2022-03-19 DIAGNOSIS — R000 Tachycardia, unspecified: Secondary | ICD-10-CM | POA: Diagnosis not present

## 2022-03-19 DIAGNOSIS — E1159 Type 2 diabetes mellitus with other circulatory complications: Secondary | ICD-10-CM | POA: Diagnosis not present

## 2022-03-19 DIAGNOSIS — I152 Hypertension secondary to endocrine disorders: Secondary | ICD-10-CM | POA: Diagnosis not present

## 2022-03-21 DIAGNOSIS — E0800 Diabetes mellitus due to underlying condition with hyperosmolarity without nonketotic hyperglycemic-hyperosmolar coma (NKHHC): Secondary | ICD-10-CM | POA: Diagnosis not present

## 2022-03-23 DIAGNOSIS — E1159 Type 2 diabetes mellitus with other circulatory complications: Secondary | ICD-10-CM | POA: Diagnosis not present

## 2022-03-23 DIAGNOSIS — R5381 Other malaise: Secondary | ICD-10-CM | POA: Diagnosis not present

## 2022-03-23 DIAGNOSIS — E2749 Other adrenocortical insufficiency: Secondary | ICD-10-CM | POA: Diagnosis not present

## 2022-03-23 DIAGNOSIS — S72001D Fracture of unspecified part of neck of right femur, subsequent encounter for closed fracture with routine healing: Secondary | ICD-10-CM | POA: Diagnosis not present

## 2022-03-23 DIAGNOSIS — I951 Orthostatic hypotension: Secondary | ICD-10-CM | POA: Diagnosis not present

## 2022-03-23 DIAGNOSIS — E785 Hyperlipidemia, unspecified: Secondary | ICD-10-CM | POA: Diagnosis not present

## 2022-05-07 DIAGNOSIS — J438 Other emphysema: Secondary | ICD-10-CM | POA: Diagnosis not present

## 2022-05-07 DIAGNOSIS — R296 Repeated falls: Secondary | ICD-10-CM | POA: Diagnosis not present

## 2022-05-07 DIAGNOSIS — R42 Dizziness and giddiness: Secondary | ICD-10-CM | POA: Diagnosis not present

## 2022-05-07 DIAGNOSIS — C4361 Malignant melanoma of right upper limb, including shoulder: Secondary | ICD-10-CM | POA: Diagnosis not present

## 2022-05-07 DIAGNOSIS — E785 Hyperlipidemia, unspecified: Secondary | ICD-10-CM | POA: Diagnosis not present

## 2022-05-07 DIAGNOSIS — I6523 Occlusion and stenosis of bilateral carotid arteries: Secondary | ICD-10-CM | POA: Diagnosis not present

## 2022-05-07 DIAGNOSIS — E1140 Type 2 diabetes mellitus with diabetic neuropathy, unspecified: Secondary | ICD-10-CM | POA: Diagnosis not present

## 2022-07-19 ENCOUNTER — Inpatient Hospital Stay (HOSPITAL_COMMUNITY): Payer: Medicare HMO | Admitting: Anesthesiology

## 2022-07-19 ENCOUNTER — Other Ambulatory Visit: Payer: Self-pay

## 2022-07-19 ENCOUNTER — Emergency Department (HOSPITAL_COMMUNITY): Payer: Medicare HMO

## 2022-07-19 ENCOUNTER — Inpatient Hospital Stay (HOSPITAL_COMMUNITY)
Admission: EM | Admit: 2022-07-19 | Discharge: 2022-07-25 | DRG: 522 | Disposition: A | Discharge status: Skilled Nursing Facility | Payer: Medicare HMO | Attending: Internal Medicine | Admitting: Internal Medicine

## 2022-07-19 ENCOUNTER — Encounter (HOSPITAL_COMMUNITY): Payer: Self-pay

## 2022-07-19 DIAGNOSIS — Z043 Encounter for examination and observation following other accident: Secondary | ICD-10-CM | POA: Diagnosis not present

## 2022-07-19 DIAGNOSIS — Y92007 Garden or yard of unspecified non-institutional (private) residence as the place of occurrence of the external cause: Secondary | ICD-10-CM

## 2022-07-19 DIAGNOSIS — S72012A Unspecified intracapsular fracture of left femur, initial encounter for closed fracture: Principal | ICD-10-CM | POA: Diagnosis present

## 2022-07-19 DIAGNOSIS — E1165 Type 2 diabetes mellitus with hyperglycemia: Secondary | ICD-10-CM | POA: Diagnosis present

## 2022-07-19 DIAGNOSIS — E785 Hyperlipidemia, unspecified: Secondary | ICD-10-CM | POA: Diagnosis not present

## 2022-07-19 DIAGNOSIS — Z6823 Body mass index (BMI) 23.0-23.9, adult: Secondary | ICD-10-CM | POA: Diagnosis not present

## 2022-07-19 DIAGNOSIS — M79605 Pain in left leg: Secondary | ICD-10-CM | POA: Diagnosis not present

## 2022-07-19 DIAGNOSIS — Z823 Family history of stroke: Secondary | ICD-10-CM

## 2022-07-19 DIAGNOSIS — W1830XA Fall on same level, unspecified, initial encounter: Secondary | ICD-10-CM | POA: Diagnosis present

## 2022-07-19 DIAGNOSIS — F17210 Nicotine dependence, cigarettes, uncomplicated: Secondary | ICD-10-CM | POA: Diagnosis present

## 2022-07-19 DIAGNOSIS — Z85820 Personal history of malignant melanoma of skin: Secondary | ICD-10-CM

## 2022-07-19 DIAGNOSIS — S72002A Fracture of unspecified part of neck of left femur, initial encounter for closed fracture: Secondary | ICD-10-CM | POA: Diagnosis not present

## 2022-07-19 DIAGNOSIS — M810 Age-related osteoporosis without current pathological fracture: Secondary | ICD-10-CM | POA: Diagnosis not present

## 2022-07-19 DIAGNOSIS — Z79899 Other long term (current) drug therapy: Secondary | ICD-10-CM

## 2022-07-19 DIAGNOSIS — I480 Paroxysmal atrial fibrillation: Secondary | ICD-10-CM | POA: Diagnosis not present

## 2022-07-19 DIAGNOSIS — S8992XA Unspecified injury of left lower leg, initial encounter: Secondary | ICD-10-CM | POA: Diagnosis not present

## 2022-07-19 DIAGNOSIS — E440 Moderate protein-calorie malnutrition: Secondary | ICD-10-CM | POA: Diagnosis not present

## 2022-07-19 DIAGNOSIS — S0990XA Unspecified injury of head, initial encounter: Secondary | ICD-10-CM | POA: Diagnosis not present

## 2022-07-19 DIAGNOSIS — Z96642 Presence of left artificial hip joint: Secondary | ICD-10-CM | POA: Diagnosis not present

## 2022-07-19 DIAGNOSIS — I110 Hypertensive heart disease with heart failure: Secondary | ICD-10-CM | POA: Diagnosis not present

## 2022-07-19 DIAGNOSIS — E119 Type 2 diabetes mellitus without complications: Secondary | ICD-10-CM | POA: Diagnosis not present

## 2022-07-19 DIAGNOSIS — Z89422 Acquired absence of other left toe(s): Secondary | ICD-10-CM | POA: Diagnosis not present

## 2022-07-19 DIAGNOSIS — F17200 Nicotine dependence, unspecified, uncomplicated: Secondary | ICD-10-CM | POA: Diagnosis not present

## 2022-07-19 DIAGNOSIS — M25562 Pain in left knee: Secondary | ICD-10-CM | POA: Diagnosis not present

## 2022-07-19 DIAGNOSIS — R079 Chest pain, unspecified: Secondary | ICD-10-CM | POA: Diagnosis not present

## 2022-07-19 DIAGNOSIS — R531 Weakness: Secondary | ICD-10-CM | POA: Diagnosis not present

## 2022-07-19 DIAGNOSIS — Z7984 Long term (current) use of oral hypoglycemic drugs: Secondary | ICD-10-CM | POA: Diagnosis not present

## 2022-07-19 DIAGNOSIS — M4312 Spondylolisthesis, cervical region: Secondary | ICD-10-CM | POA: Diagnosis not present

## 2022-07-19 DIAGNOSIS — E1169 Type 2 diabetes mellitus with other specified complication: Secondary | ICD-10-CM | POA: Diagnosis not present

## 2022-07-19 DIAGNOSIS — I959 Hypotension, unspecified: Secondary | ICD-10-CM | POA: Diagnosis not present

## 2022-07-19 DIAGNOSIS — S72042A Displaced fracture of base of neck of left femur, initial encounter for closed fracture: Secondary | ICD-10-CM | POA: Diagnosis not present

## 2022-07-19 DIAGNOSIS — Z96698 Presence of other orthopedic joint implants: Secondary | ICD-10-CM | POA: Diagnosis present

## 2022-07-19 DIAGNOSIS — Y93K1 Activity, walking an animal: Secondary | ICD-10-CM

## 2022-07-19 DIAGNOSIS — R739 Hyperglycemia, unspecified: Secondary | ICD-10-CM | POA: Diagnosis not present

## 2022-07-19 DIAGNOSIS — Z4789 Encounter for other orthopedic aftercare: Secondary | ICD-10-CM | POA: Diagnosis not present

## 2022-07-19 DIAGNOSIS — Z7401 Bed confinement status: Secondary | ICD-10-CM | POA: Diagnosis not present

## 2022-07-19 DIAGNOSIS — G8911 Acute pain due to trauma: Secondary | ICD-10-CM | POA: Diagnosis not present

## 2022-07-19 DIAGNOSIS — W19XXXA Unspecified fall, initial encounter: Secondary | ICD-10-CM | POA: Diagnosis not present

## 2022-07-19 DIAGNOSIS — Z9181 History of falling: Secondary | ICD-10-CM

## 2022-07-19 DIAGNOSIS — S72009A Fracture of unspecified part of neck of unspecified femur, initial encounter for closed fracture: Secondary | ICD-10-CM | POA: Diagnosis not present

## 2022-07-19 DIAGNOSIS — Z471 Aftercare following joint replacement surgery: Secondary | ICD-10-CM | POA: Diagnosis not present

## 2022-07-19 DIAGNOSIS — R000 Tachycardia, unspecified: Secondary | ICD-10-CM | POA: Diagnosis not present

## 2022-07-19 DIAGNOSIS — Z833 Family history of diabetes mellitus: Secondary | ICD-10-CM | POA: Diagnosis not present

## 2022-07-19 DIAGNOSIS — I10 Essential (primary) hypertension: Secondary | ICD-10-CM | POA: Diagnosis not present

## 2022-07-19 DIAGNOSIS — I5032 Chronic diastolic (congestive) heart failure: Secondary | ICD-10-CM | POA: Diagnosis present

## 2022-07-19 DIAGNOSIS — I82409 Acute embolism and thrombosis of unspecified deep veins of unspecified lower extremity: Secondary | ICD-10-CM | POA: Diagnosis not present

## 2022-07-19 DIAGNOSIS — Z86718 Personal history of other venous thrombosis and embolism: Secondary | ICD-10-CM

## 2022-07-19 DIAGNOSIS — I951 Orthostatic hypotension: Secondary | ICD-10-CM | POA: Diagnosis not present

## 2022-07-19 DIAGNOSIS — M25552 Pain in left hip: Secondary | ICD-10-CM | POA: Diagnosis not present

## 2022-07-19 DIAGNOSIS — M47812 Spondylosis without myelopathy or radiculopathy, cervical region: Secondary | ICD-10-CM | POA: Diagnosis not present

## 2022-07-19 LAB — CBC WITH DIFFERENTIAL/PLATELET
Abs Immature Granulocytes: 0.03 10*3/uL (ref 0.00–0.07)
Basophils Absolute: 0.1 10*3/uL (ref 0.0–0.1)
Basophils Relative: 1 %
Eosinophils Absolute: 0.2 10*3/uL (ref 0.0–0.5)
Eosinophils Relative: 3 %
HCT: 39.6 % (ref 39.0–52.0)
Hemoglobin: 12.7 g/dL — ABNORMAL LOW (ref 13.0–17.0)
Immature Granulocytes: 0 %
Lymphocytes Relative: 20 %
Lymphs Abs: 1.6 10*3/uL (ref 0.7–4.0)
MCH: 31 pg (ref 26.0–34.0)
MCHC: 32.1 g/dL (ref 30.0–36.0)
MCV: 96.6 fL (ref 80.0–100.0)
Monocytes Absolute: 0.3 10*3/uL (ref 0.1–1.0)
Monocytes Relative: 4 %
Neutro Abs: 5.7 10*3/uL (ref 1.7–7.7)
Neutrophils Relative %: 72 %
Platelets: 220 10*3/uL (ref 150–400)
RBC: 4.1 MIL/uL — ABNORMAL LOW (ref 4.22–5.81)
RDW: 14.2 % (ref 11.5–15.5)
WBC: 7.9 10*3/uL (ref 4.0–10.5)
nRBC: 0 % (ref 0.0–0.2)

## 2022-07-19 LAB — URINALYSIS, ROUTINE W REFLEX MICROSCOPIC
Bacteria, UA: NONE SEEN
Bilirubin Urine: NEGATIVE
Glucose, UA: >=500 mg/dL — AB
Hgb urine dipstick: NEGATIVE
Ketones, ur: NEGATIVE mg/dL
Leukocytes,Ua: NEGATIVE
Nitrite: NEGATIVE
Protein, ur: NEGATIVE mg/dL
Specific Gravity, Urine: 1.01 (ref 1.005–1.030)
pH: 7 (ref 5.0–8.0)

## 2022-07-19 LAB — COMPREHENSIVE METABOLIC PANEL
ALT: 11 U/L (ref 0–44)
AST: 17 U/L (ref 15–41)
Albumin: 3.2 g/dL — ABNORMAL LOW (ref 3.5–5.0)
Alkaline Phosphatase: 91 U/L (ref 38–126)
Anion gap: 11 (ref 5–15)
BUN: 14 mg/dL (ref 8–23)
CO2: 21 mmol/L — ABNORMAL LOW (ref 22–32)
Calcium: 8.9 mg/dL (ref 8.9–10.3)
Chloride: 107 mmol/L (ref 98–111)
Creatinine, Ser: 0.94 mg/dL (ref 0.61–1.24)
GFR, Estimated: >60 mL/min (ref >60)
Glucose, Bld: 237 mg/dL — ABNORMAL HIGH (ref 70–99)
Potassium: 3.9 mmol/L (ref 3.5–5.1)
Sodium: 139 mmol/L (ref 135–145)
Total Bilirubin: 1 mg/dL (ref 0.3–1.2)
Total Protein: 5.8 g/dL — ABNORMAL LOW (ref 6.5–8.1)

## 2022-07-19 LAB — CBG MONITORING, ED
Glucose-Capillary: 159 mg/dL — ABNORMAL HIGH (ref 70–99)
Glucose-Capillary: 265 mg/dL — ABNORMAL HIGH (ref 70–99)

## 2022-07-19 LAB — PROTIME-INR
INR: 1 (ref 0.8–1.2)
Prothrombin Time: 13.1 seconds (ref 11.4–15.2)

## 2022-07-19 LAB — GLUCOSE, CAPILLARY: Glucose-Capillary: 242 mg/dL — ABNORMAL HIGH (ref 70–99)

## 2022-07-19 MED ORDER — CLONIDINE HCL (ANALGESIA) 100 MCG/ML EP SOLN
EPIDURAL | Status: DC | PRN
  Administered 2022-07-19: 80 ug

## 2022-07-19 MED ORDER — METFORMIN HCL 500 MG PO TABS: 1000.0000 mg | ORAL_TABLET | Freq: Two times a day (BID) | ORAL | Status: DC

## 2022-07-19 MED ORDER — LINAGLIPTIN 5 MG PO TABS
5.0000 mg | ORAL_TABLET | Freq: Every day | ORAL | Status: DC
  Administered 2022-07-19 – 2022-07-25 (×6): 5 mg via ORAL
  Filled 2022-07-19 (×6): qty 1

## 2022-07-19 MED ORDER — HYDROCODONE-ACETAMINOPHEN 5-325 MG PO TABS
1.0000 | ORAL_TABLET | Freq: Four times a day (QID) | ORAL | Status: DC | PRN
  Administered 2022-07-20: 2 via ORAL
  Administered 2022-07-20: 1 via ORAL
  Administered 2022-07-21 – 2022-07-23 (×7): 2 via ORAL
  Filled 2022-07-19: qty 2
  Filled 2022-07-19: qty 1
  Filled 2022-07-19 (×7): qty 2

## 2022-07-19 MED ORDER — FENTANYL CITRATE (PF) 100 MCG/2ML IJ SOLN
INTRAMUSCULAR | Status: AC
  Administered 2022-07-19: 50 ug
  Filled 2022-07-19: qty 2

## 2022-07-19 MED ORDER — BISACODYL 10 MG RE SUPP: 10.0000 mg | Freq: Every day | RECTAL | Status: DC | PRN

## 2022-07-19 MED ORDER — POLYETHYLENE GLYCOL 3350 17 G PO PACK
17.0000 g | PACK | Freq: Every day | ORAL | Status: DC
  Administered 2022-07-21 – 2022-07-25 (×4): 17 g via ORAL
  Filled 2022-07-19 (×4): qty 1

## 2022-07-19 MED ORDER — INSULIN ASPART 100 UNIT/ML IJ SOLN
0.0000 [IU] | Freq: Three times a day (TID) | INTRAMUSCULAR | Status: DC
  Administered 2022-07-19: 8 [IU] via SUBCUTANEOUS
  Administered 2022-07-20: 2 [IU] via SUBCUTANEOUS
  Administered 2022-07-20: 5 [IU] via SUBCUTANEOUS
  Administered 2022-07-21 (×2): 3 [IU] via SUBCUTANEOUS
  Administered 2022-07-21 – 2022-07-22 (×3): 5 [IU] via SUBCUTANEOUS
  Administered 2022-07-22: 15 [IU] via SUBCUTANEOUS
  Administered 2022-07-23 (×2): 8 [IU] via SUBCUTANEOUS
  Administered 2022-07-23: 5 [IU] via SUBCUTANEOUS
  Administered 2022-07-24: 8 [IU] via SUBCUTANEOUS
  Administered 2022-07-24: 11 [IU] via SUBCUTANEOUS
  Administered 2022-07-24 – 2022-07-25 (×3): 8 [IU] via SUBCUTANEOUS

## 2022-07-19 MED ORDER — ROPIVACAINE HCL 5 MG/ML IJ SOLN
INTRAMUSCULAR | Status: DC | PRN
  Administered 2022-07-19: 30 mL via PERINEURAL

## 2022-07-19 MED ORDER — MIDODRINE HCL 5 MG PO TABS
5.0000 mg | ORAL_TABLET | Freq: Three times a day (TID) | ORAL | Status: DC
  Filled 2022-07-19: qty 1

## 2022-07-19 MED ORDER — MORPHINE SULFATE (PF) 4 MG/ML IV SOLN
4.0000 mg | Freq: Once | INTRAVENOUS | Status: AC
  Administered 2022-07-19: 4 mg via INTRAVENOUS
  Filled 2022-07-19: qty 1

## 2022-07-19 MED ORDER — GLUCERNA SHAKE PO LIQD
237.0000 mL | Freq: Three times a day (TID) | ORAL | Status: DC
  Filled 2022-07-19 (×5): qty 237

## 2022-07-19 MED ORDER — ENOXAPARIN SODIUM 40 MG/0.4ML IJ SOSY: 40.0000 mg | PREFILLED_SYRINGE | INTRAMUSCULAR | Status: DC

## 2022-07-19 MED ORDER — DOCUSATE SODIUM 100 MG PO CAPS
100.0000 mg | ORAL_CAPSULE | Freq: Two times a day (BID) | ORAL | Status: DC
  Administered 2022-07-19: 100 mg via ORAL
  Filled 2022-07-19: qty 1

## 2022-07-19 MED ORDER — SODIUM CHLORIDE 0.9 % IV BOLUS
500.0000 mL | Freq: Once | INTRAVENOUS | Status: AC
  Administered 2022-07-19: 500 mL via INTRAVENOUS

## 2022-07-19 MED ORDER — HYDROMORPHONE HCL 1 MG/ML IJ SOLN
0.5000 mg | INTRAMUSCULAR | Status: DC | PRN
  Administered 2022-07-19: 0.5 mg via INTRAVENOUS
  Filled 2022-07-19: qty 1

## 2022-07-19 MED ORDER — DEXAMETHASONE SODIUM PHOSPHATE 4 MG/ML IJ SOLN
INTRAMUSCULAR | Status: DC | PRN
  Administered 2022-07-19: 5 mg via PERINEURAL

## 2022-07-19 MED ORDER — ROSUVASTATIN CALCIUM 5 MG PO TABS
5.0000 mg | ORAL_TABLET | ORAL | Status: DC
  Administered 2022-07-21 – 2022-07-24 (×2): 5 mg via ORAL
  Filled 2022-07-19 (×4): qty 1

## 2022-07-19 MED ORDER — FLUDROCORTISONE ACETATE 0.1 MG PO TABS
0.2000 mg | ORAL_TABLET | Freq: Every day | ORAL | Status: DC
  Administered 2022-07-19 – 2022-07-25 (×6): 0.2 mg via ORAL
  Filled 2022-07-19 (×7): qty 2

## 2022-07-19 NOTE — ED Provider Notes (Signed)
Emergency Department Provider Note   I have reviewed the triage vital signs and the nursing notes.   HISTORY  Chief Complaint Fall   HPI Micheal Moore. is a 71 y.o. male past history of diabetes presents to the emergency department after a fall.  Patient was walking his dogs and he states he was standing on a slope when he suddenly fell.  He since has developed severe pain in the left hip which she states reminds him of when he recently fractured his right hip after a fall.  No numbness in the leg.  No low back or belly pain.  Unsure regarding head trauma.  Denies any chest pain, palpitations, shortness of breath either currently or prior to falling. Denies being on anticoagulation.    Past Medical History:  Diagnosis Date   Cancer ([place])    right elbow melanoma   Diabetes mellitus without complication (HCC)    Type II   Dizziness    DVT (deep venous thrombosis) (HCC)    right leg   Frequent falls    Near syncope    Syncope     Review of Systems  Constitutional: No fever/chills Cardiovascular: Denies chest pain. Respiratory: Denies shortness of breath. Gastrointestinal: No abdominal pain.  No nausea, no vomiting.  No diarrhea.  No constipation. Genitourinary: Negative for dysuria. Musculoskeletal: Negative for back pain. Positive left hip pain.  Skin: Negative for rash. Neurological: Negative for headaches, focal weakness or numbness.   ____________________________________________   PHYSICAL EXAM:  VITAL SIGNS: ED Triage Vitals  Enc Vitals Group     BP 07/19/22 1118 136/72     Pulse Rate 07/19/22 1118 86     Resp 07/19/22 1118 14     Temp 07/19/22 1118 97.6 F (36.4 C)     Temp Source 07/19/22 1118 Oral     SpO2 07/19/22 1118 100 %     Weight 07/19/22 1131 195 lb 1.7 oz (88.5 kg)     Height 07/19/22 1131 '6\' 5"'$  (1.956 m)   Constitutional: Alert and oriented. Well appearing and in no acute distress. Eyes: Conjunctivae are normal. Head:  Atraumatic. Nose: No congestion/rhinnorhea. Mouth/Throat: Mucous membranes are moist.  Oropharynx non-erythematous. Neck: No stridor. C collar in place.  Cardiovascular: Normal rate, regular rhythm. Good peripheral circulation. Grossly normal heart sounds.   Respiratory: Normal respiratory effort.  No retractions. Lungs CTAB. Gastrointestinal: Soft and nontender. No distention.  Musculoskeletal: Exquisite tenderness with any attempted range of motion of the left hip.  Normal range of motion of the right.  The left leg is externally rotated as well. Neurologic:  Normal speech and language. No gross focal neurologic deficits are appreciated.  Skin:  Skin is warm, dry and intact. No rash noted.  ____________________________________________   LABS (all labs ordered are listed, but only abnormal results are displayed)  Labs Reviewed  COMPREHENSIVE METABOLIC PANEL - Abnormal; Notable for the following components:      Result Value   CO2 21 (*)    Glucose, Bld 237 (*)    Total Protein 5.8 (*)    Albumin 3.2 (*)    All other components within normal limits  CBC WITH DIFFERENTIAL/PLATELET - Abnormal; Notable for the following components:   RBC 4.10 (*)    Hemoglobin 12.7 (*)    All other components within normal limits  PROTIME-INR   _______________________________________  RADIOLOGY  CT Head Wo Contrast  Result Date: 07/19/2022 CLINICAL DATA:  Unwitnessed fall. EXAM: CT HEAD WITHOUT CONTRAST  CT CERVICAL SPINE WITHOUT CONTRAST TECHNIQUE: Multidetector CT imaging of the head and cervical spine was performed following the standard protocol without intravenous contrast. Multiplanar CT image reconstructions of the cervical spine were also generated. RADIATION DOSE REDUCTION: This exam was performed according to the departmental dose-optimization program which includes automated exposure control, adjustment of the mA and/or kV according to patient size and/or use of iterative reconstruction  technique. COMPARISON:  February 22, 2022.  Jan 21, 2021. FINDINGS: CT HEAD FINDINGS Brain: No evidence of acute infarction, hemorrhage, hydrocephalus, extra-axial collection or mass lesion/mass effect. Vascular: No hyperdense vessel or unexpected calcification. Skull: Normal. Negative for fracture or focal lesion. Sinuses/Orbits: No acute finding. Other: None. CT CERVICAL SPINE FINDINGS Alignment: Minimal grade 1 anterolisthesis of C4-5 is noted secondary to posterior facet joint hypertrophy. Skull base and vertebrae: No acute fracture. No primary bone lesion or focal pathologic process. Soft tissues and spinal canal: No prevertebral fluid or swelling. No visible canal hematoma. Disc levels: Moderate degenerative disc disease is noted at C5-6 and C6-7 with anterior osteophyte formation. Upper chest: Negative. Other: Significant degenerative changes seen involving the left-sided posterior facet joint of C2-3 as well as the right-sided posterior facet joint of C4-5. IMPRESSION: No acute intracranial abnormality seen. Multilevel degenerative disc disease noted the cervical spine. No acute abnormality seen. Electronically Signed   By: Marijo Conception M.D.   On: 07/19/2022 12:29   CT Cervical Spine Wo Contrast  Result Date: 07/19/2022 CLINICAL DATA:  Unwitnessed fall. EXAM: CT HEAD WITHOUT CONTRAST CT CERVICAL SPINE WITHOUT CONTRAST TECHNIQUE: Multidetector CT imaging of the head and cervical spine was performed following the standard protocol without intravenous contrast. Multiplanar CT image reconstructions of the cervical spine were also generated. RADIATION DOSE REDUCTION: This exam was performed according to the departmental dose-optimization program which includes automated exposure control, adjustment of the mA and/or kV according to patient size and/or use of iterative reconstruction technique. COMPARISON:  February 22, 2022.  Jan 21, 2021. FINDINGS: CT HEAD FINDINGS Brain: No evidence of acute infarction, hemorrhage,  hydrocephalus, extra-axial collection or mass lesion/mass effect. Vascular: No hyperdense vessel or unexpected calcification. Skull: Normal. Negative for fracture or focal lesion. Sinuses/Orbits: No acute finding. Other: None. CT CERVICAL SPINE FINDINGS Alignment: Minimal grade 1 anterolisthesis of C4-5 is noted secondary to posterior facet joint hypertrophy. Skull base and vertebrae: No acute fracture. No primary bone lesion or focal pathologic process. Soft tissues and spinal canal: No prevertebral fluid or swelling. No visible canal hematoma. Disc levels: Moderate degenerative disc disease is noted at C5-6 and C6-7 with anterior osteophyte formation. Upper chest: Negative. Other: Significant degenerative changes seen involving the left-sided posterior facet joint of C2-3 as well as the right-sided posterior facet joint of C4-5. IMPRESSION: No acute intracranial abnormality seen. Multilevel degenerative disc disease noted the cervical spine. No acute abnormality seen. Electronically Signed   By: Marijo Conception M.D.   On: 07/19/2022 12:29   DG Chest 1 View  Result Date: 07/19/2022 CLINICAL DATA:  Fall.  Left hip pain. EXAM: CHEST  1 VIEW COMPARISON:  Chest two views 02/22/2022 and 01/21/2021 FINDINGS: Cardiac silhouette and mediastinal contours are within normal limits. Mild chronic interstitial thickening/scarring is similar to multiple prior studies. This is again greatest within the right lower lung. No pleural effusion or pneumothorax. No acute skeletal abnormality. IMPRESSION: Mild chronic interstitial thickening/scarring is similar to multiple prior studies. No acute lung process. Electronically Signed   By: Yvonne Kendall M.D.   On: 07/19/2022  12:20   DG Hip Unilat W or Wo Pelvis 2-3 Views Left  Result Date: 07/19/2022 CLINICAL DATA:  Fall.  Left hip pain. EXAM: DG HIP (WITH OR WITHOUT PELVIS) 2-3V LEFT COMPARISON:  AP pelvis 02/23/2022 FINDINGS: There is new mild cortical step-off of the proximal  left femoral neck indicating an acute fracture with minimal approximate 4 mm superior displacement of the distal fracture component with respect to the proximal fracture component. Partial visualization of right cephalomedullary nail fixation of proximal right femoral intertrochanteric fracture with progressive moderate healing bridging sclerosis and peripheral callus formation. No evidence of hardware failure. Moderate bilateral femoroacetabular joint space narrowing. No dislocation. There is diffuse decreased bone mineralization. Moderate multilevel degenerative disc changes at L3-4 through L5-S1. IMPRESSION: 1. Acute minimally displaced fracture of the proximal left femoral neck. 2. Partial visualization of right cephalomedullary nail fixation of proximal right femoral intertrochanteric fracture without evidence of hardware failure. Electronically Signed   By: Yvonne Kendall M.D.   On: 07/19/2022 12:09    ____________________________________________   PROCEDURES  Procedure(s) performed:   Procedures  None  ____________________________________________   INITIAL IMPRESSION / ASSESSMENT AND PLAN / ED COURSE  Pertinent labs & imaging results that were available during my care of the patient were reviewed by me and considered in my medical decision making (see chart for details).   This patient is Presenting for Evaluation of fall, which does require a range of treatment options, and is a complaint that involves a high risk of morbidity and mortality.  The Differential Diagnoses include hip fracture/dislocation, SAH, SDH, MSK strain, contusion, etc.  Critical Interventions-    Medications  sodium chloride 0.9 % bolus 500 mL (500 mLs Intravenous Bolus 07/19/22 1220)  morphine (PF) 4 MG/ML injection 4 mg (4 mg Intravenous Given 07/19/22 1221)    Reassessment after intervention: Pain improved.    I did obtain Additional Historical Information from EMS.   I decided to review pertinent  External Data, and in summary patient with right hip fracture and repair in June of this year with [facility].    Clinical Laboratory Tests Ordered, included CBC with mild anemia 12.7.  No leukocytosis.  No acute kidney injury.  Hyperglycemia without DKA.   Radiologic Tests Ordered, included left hip x-ray, CXR, CT head, CT c spine. I independently interpreted the images and agree with radiology interpretation.   Cardiac Monitor Tracing which shows NSR.    Social Determinants of Health Risk patient is a smoker.   Consult complete with Dr. Stann Mainland with ortho. Patient will go to the OR tomorrow. NPO after midnight. Please have Amenia admit to [facility] for surgery.   Medical Decision Making: Summary:  Patient presents emergency department for evaluation of fall.  Unclear if this was mechanical versus near syncope.  Patient with history of orthostatic hypotension.  Arrives with normal vitals but apparently did become hypotensive with EMS after they administered fentanyl in route but improved upon arrival.  Clinically, patient has a fractured left hip.   Reevaluation with update and discussion with patient.  Discussed x-ray of the left hip and need for repair.  Will speak with orthopedics. Plan for admit.   Disposition: admit  ____________________________________________  FINAL CLINICAL IMPRESSION(S) / ED DIAGNOSES  Final diagnoses:  Closed fracture of left hip, initial encounter [facility])  Fall, initial encounter     Note:  This document was prepared using Dragon voice recognition software and may include unintentional dictation errors.  Nanda Quinton, MD, [facility] Emergency Medicine  Margette Fast, MD 07/19/22 [phone]

## 2022-07-19 NOTE — ED Notes (Signed)
PT refused to have heels off bed.

## 2022-07-19 NOTE — ED Notes (Signed)
Patient transported to X-ray 

## 2022-07-19 NOTE — Anesthesia Preprocedure Evaluation (Addendum)
Anesthesia Evaluation  Patient identified by MRN, date of birth, ID band Patient awake    Reviewed: Allergy & Precautions, NPO status , Patient's Chart, lab work & pertinent test results  Airway Mallampati: II  TM Distance: >3 FB Neck ROM: Full    Dental  (+) Poor Dentition, Missing   Pulmonary Current Smoker and Patient abstained from smoking.   Pulmonary exam normal breath sounds clear to auscultation       Cardiovascular +CHF and + DVT  Normal cardiovascular exam Rhythm:Regular Rate:Normal  Echo 12/2021  1. Left ventricular ejection fraction, by estimation, is 50 to 55%. The left ventricle has low normal function. Left ventricular endocardial border not optimally defined to evaluate regional wall motion. Left ventricular diastolic parameters are consistent with Grade I diastolic dysfunction (impaired relaxation).   2. Right ventricular systolic function is normal. The right ventricular size is normal.   3. The mitral valve is grossly normal. Trivial mitral valve regurgitation.   4. The aortic valve is tricuspid. There is mild calcification of the aortic valve. Aortic valve regurgitation is not visualized. Aortic valve sclerosis/calcification is present, without any evidence of aortic stenosis.   5. Aortic dilatation noted. There is borderline dilatation of the aortic root, measuring 38 mm. There is borderline dilatation of the ascending aorta, measuring 38 mm.   6. The inferior vena cava is normal in size with greater than 50% respiratory variability, suggesting right atrial pressure of 3 mmHg.   Comparison(s): No prior Echocardiogram.      Neuro/Psych negative neurological ROS  negative psych ROS   GI/Hepatic negative GI ROS, Neg liver ROS,,,  Endo/Other  diabetes, Type 2, Oral Hypoglycemic Agents    Renal/GU negative Renal ROS  negative genitourinary   Musculoskeletal   Abdominal   Peds  Hematology negative  hematology ROS (+)   Anesthesia Other Findings   Reproductive/Obstetrics                             Anesthesia Physical Anesthesia Plan  ASA: 3  Anesthesia Plan: Regional   Post-op Pain Management: Regional block*   Induction:   PONV Risk Score and Plan:   Airway Management Planned: Natural Airway  Additional Equipment: None  Intra-op Plan:   Post-operative Plan:   Informed Consent: I have reviewed the patients History and Physical, chart, labs and discussed the procedure including the risks, benefits and alternatives for the proposed anesthesia with the patient or authorized representative who has indicated his/her understanding and acceptance.       Plan Discussed with:   Anesthesia Plan Comments: (Brought to PACU for hip fx block, but patient refused the block.  After leaving the bedside, pt changed his mind and requested the block.)        Anesthesia Quick Evaluation

## 2022-07-19 NOTE — ED Notes (Signed)
LT hip FX while walking dogs

## 2022-07-19 NOTE — ED Notes (Signed)
[facility] called  truck will be in route before or after shift change

## 2022-07-19 NOTE — ED Triage Notes (Signed)
Pt was found on the ground states he was walking his dog in the neighborhood ems states he denies LOC GCS of 15 according EMS there was a lot of yelling and commotion with sister and spouse on the since.  C/o of left femur pain

## 2022-07-19 NOTE — Anesthesia Procedure Notes (Signed)
Anesthesia Regional Block: Femoral nerve block   Pre-Anesthetic Checklist: , timeout performed,  Correct Patient, Correct Site, Correct Laterality,  Correct Procedure, Correct Position, site marked,  Risks and benefits discussed,  Surgical consent,  Pre-op evaluation,  At surgeon's request and post-op pain management  Laterality: Lower and Left  Prep: chloraprep       Needles:  Injection technique: Single-shot  Needle Type: Stimulator Needle - 80     Needle Length: 9cm  Needle Gauge: 22   Needle insertion depth: 6 cm   Additional Needles:   Procedures:, nerve stimulator,,, ultrasound used (permanent image in chart),,    Narrative:  Start time: 07/19/2022 2:40 PM End time: 07/19/2022 3:00 PM Injection made incrementally with aspirations every 5 mL.  Performed by: Personally  Anesthesiologist: Nolon Nations, MD  Additional Notes: BP cuff, EKG monitors applied. Sedation begun. After nerve location verified with U/S, anesthetic injected incrementally, slowly, and after negative aspirations under direct u/s guidance. Good perineural spread. Patient tolerated well.

## 2022-07-19 NOTE — Progress Notes (Signed)
Patient discussed with the EDP.  Plan will be for left total hip arthroplasty tomorrow with my partner, Dr. Lyla Glassing.  Please keep n.p.o. tonight at midnight and hold any chemical DVT prophylaxis.  Appreciate medicine service for their admission and medical management.

## 2022-07-19 NOTE — ED Notes (Signed)
[facility] called to [facility] . Pt will be transported via [facility].

## 2022-07-19 NOTE — ED Notes (Signed)
PT eating a sandwich

## 2022-07-19 NOTE — ED Notes (Signed)
Help get patient into a gown on the monitor patient is resting with call bell in reach 

## 2022-07-19 NOTE — H&P (Signed)
History and Physical    Micheal Moore. VVO:[id number] DOB: 04-19-1951 DOA: 07/19/2022  PCP: [name], MD (Confirm with patient/family/NH records and if not entered, this has to be entered at [facility] point of entry) Patient coming from: Home  I have personally briefly reviewed patient's old medical records in [place]  Chief Complaint: I fell  HPI: Micheal Moore. is a 71 y.o. male with medical history significant of IIDM, orthostatic hypotension on midodrine, HTN, HLD, chronic HFpEF, DVT history of presented with fall and left hip pain.  Patient was walking his dog this morning in the yard, and fell down forward while turning around to tend his dog. No LOC, no head injury, he denies any prodromes of lightheadedness blurry vision nauseous shortness of breath.  He has chronic orthostatic hypotension on midodrine and fludrocortisone, he reported occasionally feeling lightheadedness while rising up but denied any such episode happened today.  He had a similar event in June this year when he fractured right femoral neck underwent ORIF.  ED Course: No tachycardia no hypotension afebrile.  X-ray showed left femoral neck fracture.  Head and neck CT negative for fracture or dislocation.  Chest x-ray no infiltrates.  Labs reviewed, glucose elevated.  Review of Systems: As per HPI otherwise 14 point review of systems negative.    Past Medical History:  Diagnosis Date   Cancer ([place])    right elbow melanoma   Diabetes mellitus without complication ([place])    Type II   Dizziness    DVT (deep venous thrombosis) ([facility])    right leg   Frequent falls    Near syncope    Syncope     Past Surgical History:  Procedure Laterality Date   AMPUTATION TOE Left 04/28/2020   Procedure: AMPUTATION LEFT GREAT TOE;  Surgeon: Newt Minion, MD;  Location: [facility];  Service: Orthopedics;  Laterality: Left;   INTRAMEDULLARY (IM) NAIL INTERTROCHANTERIC Right 02/23/2022   Procedure:  INTRAMEDULLARY (IM) NAIL INTERTROCHANTRIC, RIGHT;  Surgeon: Rod Can, MD;  Location: [facility];  Service: Orthopedics;  Laterality: Right;   MELANOMA EXCISION WITH SENTINEL LYMPH NODE BIOPSY Right 08/19/2020   Procedure: WIDE LOCAL EXCISION RIGHT ELBOW MELANOMA WITH RIGHT AXILLARY SENTINEL LYMPH NODE BIOPSY;  Surgeon: Dwan Bolt, MD;  Location: [facility];  Service: General;  Laterality: Right;  2nd incision in axilla     reports that he has been smoking cigarettes. He has a 3.80 pack-year smoking history. He has never used smokeless tobacco. He reports that he does not currently use drugs. He reports that he does not drink alcohol.  No Known Allergies  Family History  Problem Relation Age of Onset   Stroke Mother    Diabetes Father    Stroke Sister    Diabetes Sister      Prior to Admission medications   Medication Sig Start Date End Date Taking? Authorizing Provider  docusate sodium (COLACE) 100 MG capsule Take 1 capsule (100 mg total) by mouth 2 (two) times daily. 03/05/22   Lavina Hamman, MD  feeding supplement, GLUCERNA SHAKE, (GLUCERNA SHAKE) LIQD Take 237 mLs by mouth 3 (three) times daily between meals. 03/05/22   Lavina Hamman, MD  fludrocortisone (FLORINEF) 0.1 MG tablet Take 2 tablets (0.2 mg total) by mouth daily. 03/05/22   Lavina Hamman, MD  glimepiride (AMARYL) 2 MG tablet Take 2 mg by mouth daily.  01/26/19   [provider]  metFORMIN (GLUCOPHAGE) 500 MG tablet Take  2 tablets (1,000 mg total) by mouth 2 (two) times daily. 04/29/20   Geradine Girt, DO  midodrine (PROAMATINE) 5 MG tablet Take 1 tablet (5 mg total) by mouth 3 (three) times daily with meals. 03/05/22   Lavina Hamman, MD  nicotine (NICODERM CQ - DOSED IN MG/24 HOURS) 14 mg/24hr patch Place 1 patch (14 mg total) onto the skin daily. 03/05/22   Lavina Hamman, MD  polyethylene glycol (MIRALAX / GLYCOLAX) 17 g packet Take 17 g by mouth daily. 03/05/22   Lavina Hamman, MD  rosuvastatin (CRESTOR) 5  MG tablet Take 5 mg by mouth See admin instructions. Take two times weekly. Take one tablet by mouth on Tuesday & Saturdays only per patient 03/26/20   [provider]  sitaGLIPtin (JANUVIA) 50 MG tablet Take 1 tablet (50 mg total) by mouth daily. 03/05/22   Lavina Hamman, MD    Physical Exam: Vitals:   07/19/22 1118 07/19/22 1130 07/19/22 1131 07/19/22 1145  BP: 136/72 (!) 137/90  (!) 140/84  Pulse: 86 85  80  Resp: 14 (!) 6  13  Temp: 97.6 F (36.4 C)     TempSrc: Oral     SpO2: 100% 100%  100%  Weight:   88.5 kg   Height:   '6\' 5"'$  (1.956 m)     Constitutional: NAD, calm, comfortable Vitals:   07/19/22 1118 07/19/22 1130 07/19/22 1131 07/19/22 1145  BP: 136/72 (!) 137/90  (!) 140/84  Pulse: 86 85  80  Resp: 14 (!) 6  13  Temp: 97.6 F (36.4 C)     TempSrc: Oral     SpO2: 100% 100%  100%  Weight:   88.5 kg   Height:   '6\' 5"'$  (1.956 m)    Eyes: PERRL, lids and conjunctivae normal ENMT: Mucous membranes are moist. Posterior pharynx clear of any exudate or lesions.Normal dentition.  Neck: normal, supple, no masses, no thyromegaly Respiratory: clear to auscultation bilaterally, no wheezing, no crackles. Normal respiratory effort. No accessory muscle use.  Cardiovascular: Regular rate and rhythm, no murmurs / rubs / gallops. No extremity edema. 2+ pedal pulses. No carotid bruits.  Abdomen: no tenderness, no masses palpated. No hepatosplenomegaly. Bowel sounds positive.  Musculoskeletal: no clubbing / cyanosis. No joint deformity upper and lower extremities. Good ROM, no contractures. Normal muscle tone.  Left hip tenderness Skin: no rashes, lesions, ulcers. No induration Neurologic: CN 2-12 grossly intact. Sensation intact, DTR normal. Strength 5/5 in all 4.  Psychiatric: Normal judgment and insight. Alert and oriented x 3. Normal mood.     Labs on Admission: I have personally reviewed following labs and imaging studies  CBC: Recent Labs  Lab 07/19/22 1146  WBC  7.9  NEUTROABS 5.7  HGB 12.7*  HCT 39.6  MCV 96.6  PLT 517   Basic Metabolic Panel: Recent Labs  Lab 07/19/22 1146  NA 139  K 3.9  CL 107  CO2 21*  GLUCOSE 237*  BUN 14  CREATININE 0.94  CALCIUM 8.9   GFR: Estimated Creatinine Clearance: 90.2 mL/min (by C-G formula based on SCr of 0.94 mg/dL). Liver Function Tests: Recent Labs  Lab 07/19/22 1146  AST 17  ALT 11  ALKPHOS 91  BILITOT 1.0  PROT 5.8*  ALBUMIN 3.2*   No results for input(s): "LIPASE", "AMYLASE" in the last 168 hours. No results for input(s): "AMMONIA" in the last 168 hours. Coagulation Profile: Recent Labs  Lab 07/19/22 1146  INR 1.0  Cardiac Enzymes: No results for input(s): "CKTOTAL", "CKMB", "CKMBINDEX", "TROPONINI" in the last 168 hours. BNP (last 3 results) No results for input(s): "PROBNP" in the last 8760 hours. HbA1C: No results for input(s): "HGBA1C" in the last 72 hours. CBG: No results for input(s): "GLUCAP" in the last 168 hours. Lipid Profile: No results for input(s): "CHOL", "HDL", "LDLCALC", "TRIG", "CHOLHDL", "LDLDIRECT" in the last 72 hours. Thyroid Function Tests: No results for input(s): "TSH", "T4TOTAL", "FREET4", "T3FREE", "THYROIDAB" in the last 72 hours. Anemia Panel: No results for input(s): "VITAMINB12", "FOLATE", "FERRITIN", "TIBC", "IRON", "RETICCTPCT" in the last 72 hours. Urine analysis:    Component Value Date/Time   COLORURINE YELLOW 03/01/2022 2330   APPEARANCEUR CLEAR 03/01/2022 2330   APPEARANCEUR Clear 12/04/2021 1430   LABSPEC 1.016 03/01/2022 2330   PHURINE 5.0 03/01/2022 2330   GLUCOSEU >=500 (A) 03/01/2022 2330   HGBUR NEGATIVE 03/01/2022 2330   BILIRUBINUR NEGATIVE 03/01/2022 2330   BILIRUBINUR Negative 12/04/2021 1430   KETONESUR NEGATIVE 03/01/2022 2330   PROTEINUR NEGATIVE 03/01/2022 2330   NITRITE NEGATIVE 03/01/2022 2330   LEUKOCYTESUR NEGATIVE 03/01/2022 2330    Radiological Exams on Admission: CT Head Wo Contrast  Result Date:  07/19/2022 CLINICAL DATA:  Unwitnessed fall. EXAM: CT HEAD WITHOUT CONTRAST CT CERVICAL SPINE WITHOUT CONTRAST TECHNIQUE: Multidetector CT imaging of the head and cervical spine was performed following the standard protocol without intravenous contrast. Multiplanar CT image reconstructions of the cervical spine were also generated. RADIATION DOSE REDUCTION: This exam was performed according to the departmental dose-optimization program which includes automated exposure control, adjustment of the mA and/or kV according to patient size and/or use of iterative reconstruction technique. COMPARISON:  February 22, 2022.  Jan 21, 2021. FINDINGS: CT HEAD FINDINGS Brain: No evidence of acute infarction, hemorrhage, hydrocephalus, extra-axial collection or mass lesion/mass effect. Vascular: No hyperdense vessel or unexpected calcification. Skull: Normal. Negative for fracture or focal lesion. Sinuses/Orbits: No acute finding. Other: None. CT CERVICAL SPINE FINDINGS Alignment: Minimal grade 1 anterolisthesis of C4-5 is noted secondary to posterior facet joint hypertrophy. Skull base and vertebrae: No acute fracture. No primary bone lesion or focal pathologic process. Soft tissues and spinal canal: No prevertebral fluid or swelling. No visible canal hematoma. Disc levels: Moderate degenerative disc disease is noted at C5-6 and C6-7 with anterior osteophyte formation. Upper chest: Negative. Other: Significant degenerative changes seen involving the left-sided posterior facet joint of C2-3 as well as the right-sided posterior facet joint of C4-5. IMPRESSION: No acute intracranial abnormality seen. Multilevel degenerative disc disease noted the cervical spine. No acute abnormality seen. Electronically Signed   By: Marijo Conception M.D.   On: 07/19/2022 12:29   CT Cervical Spine Wo Contrast  Result Date: 07/19/2022 CLINICAL DATA:  Unwitnessed fall. EXAM: CT HEAD WITHOUT CONTRAST CT CERVICAL SPINE WITHOUT CONTRAST TECHNIQUE:  Multidetector CT imaging of the head and cervical spine was performed following the standard protocol without intravenous contrast. Multiplanar CT image reconstructions of the cervical spine were also generated. RADIATION DOSE REDUCTION: This exam was performed according to the departmental dose-optimization program which includes automated exposure control, adjustment of the mA and/or kV according to patient size and/or use of iterative reconstruction technique. COMPARISON:  February 22, 2022.  Jan 21, 2021. FINDINGS: CT HEAD FINDINGS Brain: No evidence of acute infarction, hemorrhage, hydrocephalus, extra-axial collection or mass lesion/mass effect. Vascular: No hyperdense vessel or unexpected calcification. Skull: Normal. Negative for fracture or focal lesion. Sinuses/Orbits: No acute finding. Other: None. CT CERVICAL SPINE FINDINGS Alignment: Minimal  grade 1 anterolisthesis of C4-5 is noted secondary to posterior facet joint hypertrophy. Skull base and vertebrae: No acute fracture. No primary bone lesion or focal pathologic process. Soft tissues and spinal canal: No prevertebral fluid or swelling. No visible canal hematoma. Disc levels: Moderate degenerative disc disease is noted at C5-6 and C6-7 with anterior osteophyte formation. Upper chest: Negative. Other: Significant degenerative changes seen involving the left-sided posterior facet joint of C2-3 as well as the right-sided posterior facet joint of C4-5. IMPRESSION: No acute intracranial abnormality seen. Multilevel degenerative disc disease noted the cervical spine. No acute abnormality seen. Electronically Signed   By: Marijo Conception M.D.   On: 07/19/2022 12:29   DG Chest 1 View  Result Date: 07/19/2022 CLINICAL DATA:  Fall.  Left hip pain. EXAM: CHEST  1 VIEW COMPARISON:  Chest two views 02/22/2022 and 01/21/2021 FINDINGS: Cardiac silhouette and mediastinal contours are within normal limits. Mild chronic interstitial thickening/scarring is similar to  multiple prior studies. This is again greatest within the right lower lung. No pleural effusion or pneumothorax. No acute skeletal abnormality. IMPRESSION: Mild chronic interstitial thickening/scarring is similar to multiple prior studies. No acute lung process. Electronically Signed   By: Yvonne Kendall M.D.   On: 07/19/2022 12:20   DG Hip Unilat W or Wo Pelvis 2-3 Views Left  Result Date: 07/19/2022 CLINICAL DATA:  Fall.  Left hip pain. EXAM: DG HIP (WITH OR WITHOUT PELVIS) 2-3V LEFT COMPARISON:  AP pelvis 02/23/2022 FINDINGS: There is new mild cortical step-off of the proximal left femoral neck indicating an acute fracture with minimal approximate 4 mm superior displacement of the distal fracture component with respect to the proximal fracture component. Partial visualization of right cephalomedullary nail fixation of proximal right femoral intertrochanteric fracture with progressive moderate healing bridging sclerosis and peripheral callus formation. No evidence of hardware failure. Moderate bilateral femoroacetabular joint space narrowing. No dislocation. There is diffuse decreased bone mineralization. Moderate multilevel degenerative disc changes at L3-4 through L5-S1. IMPRESSION: 1. Acute minimally displaced fracture of the proximal left femoral neck. 2. Partial visualization of right cephalomedullary nail fixation of proximal right femoral intertrochanteric fracture without evidence of hardware failure. Electronically Signed   By: Yvonne Kendall M.D.   On: 07/19/2022 12:09    EKG: Independently reviewed.  Sinus, no acute ST changes.  Assessment/Plan Principal Problem:   Hip fracture ([place])  (please populate well all problems here in Problem List. (For example, if patient is on BP meds at home and you resume or decide to hold them, it is a problem that needs to be her. Same for CAD, COPD, HLD and so on)  Left femoral neck fracture, closed -[facility]IF tomorrow at [facility] as per his request -Chemical DVT  prophylaxis starting tomorrow, SCD for tonight. -Alternative Tylenol and morphine for pain control, anesthesiology consulted for local neuro block.  Orthostatic hypotension -Appears to be stable, continue midodrine and fludrocortisone -Check orthostatic vital signs when able to stand  IIDM with hyperglycemia -Hold off sulfonylurea -Continue metformin and Tradjenta -Add sliding scale  HLD -On Statin  DVT prophylaxis: Lovenox postpone starting to tomorrow Code Status: Full code Family Communication: None at bedside Disposition Plan: Patient sick with left femoral neck fracture requiring ORIF, expect more than 2 midnight hospital stay Consults called: Dr. Stann Mainland orthopedic surgery Admission status: MedSurg admission   Lequita Halt MD [facility] Pager [phone]  07/19/2022, 1:18 PM

## 2022-07-19 NOTE — ED Notes (Signed)
PT going to PACU for a block. Block for pain.

## 2022-07-20 ENCOUNTER — Other Ambulatory Visit: Payer: Self-pay

## 2022-07-20 ENCOUNTER — Encounter (HOSPITAL_COMMUNITY): Payer: Self-pay | Admitting: Internal Medicine

## 2022-07-20 ENCOUNTER — Inpatient Hospital Stay (HOSPITAL_COMMUNITY): Payer: Medicare HMO | Admitting: Certified Registered Nurse Anesthetist

## 2022-07-20 ENCOUNTER — Inpatient Hospital Stay (HOSPITAL_COMMUNITY): Payer: Medicare HMO

## 2022-07-20 ENCOUNTER — Encounter (HOSPITAL_COMMUNITY)
Admission: EM | Disposition: A | Discharge status: Skilled Nursing Facility | Payer: Self-pay | Source: Home / Self Care | Attending: Internal Medicine

## 2022-07-20 DIAGNOSIS — I82409 Acute embolism and thrombosis of unspecified deep veins of unspecified lower extremity: Secondary | ICD-10-CM

## 2022-07-20 DIAGNOSIS — W19XXXA Unspecified fall, initial encounter: Secondary | ICD-10-CM | POA: Diagnosis not present

## 2022-07-20 DIAGNOSIS — S72002A Fracture of unspecified part of neck of left femur, initial encounter for closed fracture: Secondary | ICD-10-CM | POA: Diagnosis not present

## 2022-07-20 DIAGNOSIS — Z7984 Long term (current) use of oral hypoglycemic drugs: Secondary | ICD-10-CM

## 2022-07-20 DIAGNOSIS — E119 Type 2 diabetes mellitus without complications: Secondary | ICD-10-CM

## 2022-07-20 DIAGNOSIS — E440 Moderate protein-calorie malnutrition: Secondary | ICD-10-CM | POA: Insufficient documentation

## 2022-07-20 HISTORY — PX: TOTAL HIP ARTHROPLASTY: SHX124

## 2022-07-20 LAB — CBC
HCT: 37.9 % — ABNORMAL LOW (ref 39.0–52.0)
Hemoglobin: 12.5 g/dL — ABNORMAL LOW (ref 13.0–17.0)
MCH: 31.1 pg (ref 26.0–34.0)
MCHC: 33 g/dL (ref 30.0–36.0)
MCV: 94.3 fL (ref 80.0–100.0)
Platelets: 209 10*3/uL (ref 150–400)
RBC: 4.02 MIL/uL — ABNORMAL LOW (ref 4.22–5.81)
RDW: 14 % (ref 11.5–15.5)
WBC: 9.6 10*3/uL (ref 4.0–10.5)
nRBC: 0 % (ref 0.0–0.2)

## 2022-07-20 LAB — SURGICAL PCR SCREEN
MRSA, PCR: NEGATIVE
Staphylococcus aureus: NEGATIVE

## 2022-07-20 LAB — GLUCOSE, CAPILLARY
Glucose-Capillary: 203 mg/dL — ABNORMAL HIGH (ref 70–99)
Glucose-Capillary: 119 mg/dL — ABNORMAL HIGH (ref 70–99)
Glucose-Capillary: 121 mg/dL — ABNORMAL HIGH (ref 70–99)
Glucose-Capillary: 147 mg/dL — ABNORMAL HIGH (ref 70–99)
Glucose-Capillary: 349 mg/dL — ABNORMAL HIGH (ref 70–99)

## 2022-07-20 SURGERY — ARTHROPLASTY, HIP, TOTAL,POSTERIOR APPROACH
Anesthesia: Choice | Site: Hip | Laterality: Left

## 2022-07-20 SURGERY — ARTHROPLASTY, HIP, TOTAL, ANTERIOR APPROACH
Anesthesia: General | Site: Hip | Laterality: Left

## 2022-07-20 MED ORDER — DOCUSATE SODIUM 100 MG PO CAPS
100.0000 mg | ORAL_CAPSULE | Freq: Two times a day (BID) | ORAL | Status: DC
  Administered 2022-07-20 – 2022-07-25 (×9): 100 mg via ORAL
  Filled 2022-07-20 (×10): qty 1

## 2022-07-20 MED ORDER — ROCURONIUM BROMIDE 10 MG/ML (PF) SYRINGE
PREFILLED_SYRINGE | INTRAVENOUS | Status: AC
  Filled 2022-07-20: qty 10

## 2022-07-20 MED ORDER — MENTHOL 3 MG MT LOZG: 1.0000 | LOZENGE | OROMUCOSAL | Status: DC | PRN

## 2022-07-20 MED ORDER — CEFAZOLIN SODIUM-DEXTROSE 2-4 GM/100ML-% IV SOLN: 2.0000 g | INTRAVENOUS | Status: DC

## 2022-07-20 MED ORDER — POVIDONE-IODINE 10 % EX SWAB: 2.0000 | Freq: Once | CUTANEOUS | Status: DC

## 2022-07-20 MED ORDER — CHLORHEXIDINE GLUCONATE 0.12 % MT SOLN
15.0000 mL | Freq: Once | OROMUCOSAL | Status: AC
  Administered 2022-07-20: 15 mL via OROMUCOSAL

## 2022-07-20 MED ORDER — TRANEXAMIC ACID-NACL 1000-0.7 MG/100ML-% IV SOLN
INTRAVENOUS | Status: DC | PRN
  Administered 2022-07-20: 1000 mg via INTRAVENOUS

## 2022-07-20 MED ORDER — FENTANYL CITRATE PF 50 MCG/ML IJ SOSY: 50.0000 ug | PREFILLED_SYRINGE | Freq: Once | INTRAMUSCULAR | Status: DC

## 2022-07-20 MED ORDER — DEXAMETHASONE SODIUM PHOSPHATE 10 MG/ML IJ SOLN
INTRAMUSCULAR | Status: AC
  Filled 2022-07-20: qty 1

## 2022-07-20 MED ORDER — BUPIVACAINE-EPINEPHRINE 0.25% -1:200000 IJ SOLN
INTRAMUSCULAR | Status: DC | PRN
  Administered 2022-07-20: 30 mL

## 2022-07-20 MED ORDER — MEPERIDINE HCL 50 MG/ML IJ SOLN: 6.2500 mg | INTRAMUSCULAR | Status: DC | PRN

## 2022-07-20 MED ORDER — ROCURONIUM BROMIDE 100 MG/10ML IV SOLN
INTRAVENOUS | Status: DC | PRN
  Administered 2022-07-20: 60 mg via INTRAVENOUS
  Administered 2022-07-20: 20 mg via INTRAVENOUS

## 2022-07-20 MED ORDER — LACTATED RINGERS IV SOLN: INTRAVENOUS | Status: DC

## 2022-07-20 MED ORDER — METHOCARBAMOL 500 MG PO TABS
500.0000 mg | ORAL_TABLET | Freq: Four times a day (QID) | ORAL | Status: DC | PRN
  Administered 2022-07-20 – 2022-07-22 (×5): 500 mg via ORAL
  Filled 2022-07-20 (×4): qty 1

## 2022-07-20 MED ORDER — LIDOCAINE HCL (CARDIAC) PF 100 MG/5ML IV SOSY
PREFILLED_SYRINGE | INTRAVENOUS | Status: DC | PRN
  Administered 2022-07-20: 40 mg via INTRAVENOUS

## 2022-07-20 MED ORDER — DIPHENHYDRAMINE HCL 12.5 MG/5ML PO ELIX: 12.5000 mg | ORAL_SOLUTION | ORAL | Status: DC | PRN

## 2022-07-20 MED ORDER — SODIUM CHLORIDE (PF) 0.9 % IJ SOLN
INTRAMUSCULAR | Status: DC | PRN
  Administered 2022-07-20: 30 mL

## 2022-07-20 MED ORDER — CEFAZOLIN SODIUM-DEXTROSE 2-4 GM/100ML-% IV SOLN
2.0000 g | Freq: Four times a day (QID) | INTRAVENOUS | Status: AC
  Administered 2022-07-20 – 2022-07-21 (×2): 2 g via INTRAVENOUS
  Filled 2022-07-20 (×2): qty 100

## 2022-07-20 MED ORDER — KETOROLAC TROMETHAMINE 30 MG/ML IJ SOLN
INTRAMUSCULAR | Status: AC
  Filled 2022-07-20: qty 1

## 2022-07-20 MED ORDER — OXYCODONE HCL 5 MG PO TABS
5.0000 mg | ORAL_TABLET | Freq: Once | ORAL | Status: AC | PRN
  Administered 2022-07-20: 5 mg via ORAL

## 2022-07-20 MED ORDER — METOCLOPRAMIDE HCL 5 MG PO TABS: 5.0000 mg | ORAL_TABLET | Freq: Three times a day (TID) | ORAL | Status: DC | PRN

## 2022-07-20 MED ORDER — METHOCARBAMOL 500 MG PO TABS
ORAL_TABLET | ORAL | Status: AC
  Filled 2022-07-20: qty 1

## 2022-07-20 MED ORDER — SODIUM CHLORIDE (PF) 0.9 % IJ SOLN
INTRAMUSCULAR | Status: AC
  Filled 2022-07-20: qty 30

## 2022-07-20 MED ORDER — PHENYLEPHRINE HCL (PRESSORS) 10 MG/ML IV SOLN
INTRAVENOUS | Status: DC | PRN
  Administered 2022-07-20: 120 ug via INTRAVENOUS
  Administered 2022-07-20: 160 ug via INTRAVENOUS
  Administered 2022-07-20: 100 ug via INTRAVENOUS

## 2022-07-20 MED ORDER — CHLORHEXIDINE GLUCONATE 4 % EX LIQD: 60.0000 mL | Freq: Once | CUTANEOUS | Status: DC

## 2022-07-20 MED ORDER — PHENOL 1.4 % MT LIQD: 1.0000 | OROMUCOSAL | Status: DC | PRN

## 2022-07-20 MED ORDER — BUPIVACAINE-EPINEPHRINE (PF) 0.25% -1:200000 IJ SOLN
INTRAMUSCULAR | Status: AC
  Filled 2022-07-20: qty 30

## 2022-07-20 MED ORDER — ONDANSETRON HCL 4 MG/2ML IJ SOLN
INTRAMUSCULAR | Status: DC | PRN
  Administered 2022-07-20: 4 mg via INTRAVENOUS

## 2022-07-20 MED ORDER — PROPOFOL 10 MG/ML IV BOLUS
INTRAVENOUS | Status: DC | PRN
  Administered 2022-07-20: 100 mg via INTRAVENOUS

## 2022-07-20 MED ORDER — PROPOFOL 1000 MG/100ML IV EMUL
INTRAVENOUS | Status: AC
  Filled 2022-07-20: qty 100

## 2022-07-20 MED ORDER — ALBUMIN HUMAN 5 % IV SOLN
INTRAVENOUS | Status: AC
  Filled 2022-07-20: qty 250

## 2022-07-20 MED ORDER — TRANEXAMIC ACID-NACL 1000-0.7 MG/100ML-% IV SOLN
INTRAVENOUS | Status: AC
  Filled 2022-07-20: qty 100

## 2022-07-20 MED ORDER — MIDAZOLAM HCL 5 MG/5ML IJ SOLN
INTRAMUSCULAR | Status: DC | PRN
  Administered 2022-07-20 (×2): 1 mg via INTRAVENOUS

## 2022-07-20 MED ORDER — PROMETHAZINE HCL 25 MG/ML IJ SOLN: 6.2500 mg | INTRAMUSCULAR | Status: DC | PRN

## 2022-07-20 MED ORDER — SODIUM CHLORIDE 0.9 % IV SOLN: INTRAVENOUS | Status: DC

## 2022-07-20 MED ORDER — HYDROMORPHONE HCL 2 MG/ML IJ SOLN
INTRAMUSCULAR | Status: AC
  Filled 2022-07-20: qty 1

## 2022-07-20 MED ORDER — KETOROLAC TROMETHAMINE 30 MG/ML IJ SOLN
INTRAMUSCULAR | Status: DC | PRN
  Administered 2022-07-20: 30 mg via INTRAVENOUS

## 2022-07-20 MED ORDER — OXYCODONE HCL 5 MG/5ML PO SOLN: 5.0000 mg | Freq: Once | ORAL | Status: AC | PRN

## 2022-07-20 MED ORDER — ADULT MULTIVITAMIN W/MINERALS CH
1.0000 | ORAL_TABLET | Freq: Every day | ORAL | Status: DC
  Administered 2022-07-21 – 2022-07-25 (×5): 1 via ORAL
  Filled 2022-07-20 (×5): qty 1

## 2022-07-20 MED ORDER — CEFAZOLIN SODIUM-DEXTROSE 2-4 GM/100ML-% IV SOLN
INTRAVENOUS | Status: AC
  Filled 2022-07-20: qty 100

## 2022-07-20 MED ORDER — SENNA 8.6 MG PO TABS
1.0000 | ORAL_TABLET | Freq: Two times a day (BID) | ORAL | Status: DC
  Administered 2022-07-20 – 2022-07-25 (×9): 8.6 mg via ORAL
  Filled 2022-07-20 (×9): qty 1

## 2022-07-20 MED ORDER — HYDROMORPHONE HCL 1 MG/ML IJ SOLN: 0.2500 mg | INTRAMUSCULAR | Status: DC | PRN

## 2022-07-20 MED ORDER — METHOCARBAMOL 500 MG IVPB - SIMPLE MED: 500.0000 mg | Freq: Four times a day (QID) | INTRAVENOUS | Status: DC | PRN

## 2022-07-20 MED ORDER — PHENYLEPHRINE HCL-NACL 20-0.9 MG/250ML-% IV SOLN
INTRAVENOUS | Status: DC | PRN
  Administered 2022-07-20: 40 ug/min via INTRAVENOUS
  Administered 2022-07-20: 160 ug/min via INTRAVENOUS

## 2022-07-20 MED ORDER — ENSURE MAX PROTEIN PO LIQD
11.0000 [oz_av] | Freq: Two times a day (BID) | ORAL | Status: DC
  Administered 2022-07-21 – 2022-07-25 (×8): 11 [oz_av] via ORAL
  Filled 2022-07-20 (×10): qty 330

## 2022-07-20 MED ORDER — APIXABAN 2.5 MG PO TABS
2.5000 mg | ORAL_TABLET | Freq: Two times a day (BID) | ORAL | Status: DC
  Administered 2022-07-21 – 2022-07-25 (×9): 2.5 mg via ORAL
  Filled 2022-07-20 (×9): qty 1

## 2022-07-20 MED ORDER — METOCLOPRAMIDE HCL 5 MG/ML IJ SOLN: 5.0000 mg | Freq: Three times a day (TID) | INTRAMUSCULAR | Status: DC | PRN

## 2022-07-20 MED ORDER — ALUM & MAG HYDROXIDE-SIMETH 200-200-20 MG/5ML PO SUSP: 30.0000 mL | ORAL | Status: DC | PRN

## 2022-07-20 MED ORDER — TRANEXAMIC ACID-NACL 1000-0.7 MG/100ML-% IV SOLN: 1000.0000 mg | INTRAVENOUS | Status: DC

## 2022-07-20 MED ORDER — ONDANSETRON HCL 4 MG PO TABS: 4.0000 mg | ORAL_TABLET | Freq: Four times a day (QID) | ORAL | Status: DC | PRN

## 2022-07-20 MED ORDER — ISOPROPYL ALCOHOL 70 % SOLN
Status: AC
  Filled 2022-07-20: qty 480

## 2022-07-20 MED ORDER — ALBUMIN HUMAN 5 % IV SOLN: INTRAVENOUS | Status: DC | PRN

## 2022-07-20 MED ORDER — FENTANYL CITRATE (PF) 100 MCG/2ML IJ SOLN
INTRAMUSCULAR | Status: AC
  Filled 2022-07-20: qty 2

## 2022-07-20 MED ORDER — ISOPROPYL ALCOHOL 70 % SOLN
Status: DC | PRN
  Administered 2022-07-20: 1 via TOPICAL

## 2022-07-20 MED ORDER — ONDANSETRON HCL 4 MG/2ML IJ SOLN: 4.0000 mg | Freq: Four times a day (QID) | INTRAMUSCULAR | Status: DC | PRN

## 2022-07-20 MED ORDER — CEFAZOLIN SODIUM-DEXTROSE 2-3 GM-%(50ML) IV SOLR
INTRAVENOUS | Status: DC | PRN
  Administered 2022-07-20: 2 g via INTRAVENOUS

## 2022-07-20 MED ORDER — SODIUM CHLORIDE 0.9 % IR SOLN
Status: DC | PRN
  Administered 2022-07-20 (×2): 1000 mL

## 2022-07-20 MED ORDER — MIDAZOLAM HCL 2 MG/2ML IJ SOLN
INTRAMUSCULAR | Status: AC
  Filled 2022-07-20: qty 2

## 2022-07-20 MED ORDER — POLYETHYLENE GLYCOL 3350 17 G PO PACK: 17.0000 g | PACK | Freq: Every day | ORAL | Status: DC | PRN

## 2022-07-20 MED ORDER — LIDOCAINE HCL (PF) 2 % IJ SOLN
INTRAMUSCULAR | Status: AC
  Filled 2022-07-20: qty 5

## 2022-07-20 MED ORDER — EPHEDRINE SULFATE (PRESSORS) 50 MG/ML IJ SOLN
INTRAMUSCULAR | Status: DC | PRN
  Administered 2022-07-20: 5 mg via INTRAVENOUS
  Administered 2022-07-20: 10 mg via INTRAVENOUS
  Administered 2022-07-20 (×2): 5 mg via INTRAVENOUS

## 2022-07-20 MED ORDER — SUGAMMADEX SODIUM 200 MG/2ML IV SOLN
INTRAVENOUS | Status: DC | PRN
  Administered 2022-07-20: 200 mg via INTRAVENOUS

## 2022-07-20 MED ORDER — FENTANYL CITRATE (PF) 100 MCG/2ML IJ SOLN
INTRAMUSCULAR | Status: DC | PRN
  Administered 2022-07-20: 50 ug via INTRAVENOUS
  Administered 2022-07-20 (×2): 25 ug via INTRAVENOUS
  Administered 2022-07-20 (×2): 50 ug via INTRAVENOUS

## 2022-07-20 MED ORDER — OXYCODONE HCL 5 MG PO TABS
ORAL_TABLET | ORAL | Status: AC
  Filled 2022-07-20: qty 1

## 2022-07-20 MED ORDER — MIDAZOLAM HCL 2 MG/2ML IJ SOLN: 0.5000 mg | Freq: Once | INTRAMUSCULAR | Status: DC | PRN

## 2022-07-20 SURGICAL SUPPLY — 54 items
ADAPTER DRAIN UROSTOMY TUBE NS (OSTOMY) IMPLANT
BAG COUNTER SPONGE SURGICOUNT (BAG) IMPLANT
BAG DECANTER FOR FLEXI CONT (MISCELLANEOUS) ×1 IMPLANT
BAG ZIPLOCK 12X15 (MISCELLANEOUS) ×1 IMPLANT
BLADE SURG SZ10 CARB STEEL (BLADE) ×2 IMPLANT
CHLORAPREP W/TINT 26 (MISCELLANEOUS) ×1 IMPLANT
CNTNR URN SCR LID CUP LEK RST (MISCELLANEOUS) IMPLANT
CONT SPEC 4OZ STRL OR WHT (MISCELLANEOUS)
COVER SURGICAL LIGHT HANDLE (MISCELLANEOUS) ×1 IMPLANT
DERMABOND ADVANCED .7 DNX12 (GAUZE/BANDAGES/DRESSINGS) ×2 IMPLANT
DRAPE ORTHO SPLIT 77X108 STRL (DRAPES) ×2
DRAPE POUCH INSTRU U-SHP 10X18 (DRAPES) ×1 IMPLANT
DRAPE SURG 17X11 SM STRL (DRAPES) ×1 IMPLANT
DRAPE SURG ORHT 6 SPLT 77X108 (DRAPES) ×2 IMPLANT
DRAPE U-SHAPE 47X51 STRL (DRAPES) ×1 IMPLANT
DRSG AQUACEL AG ADV 3.5X10 (GAUZE/BANDAGES/DRESSINGS) ×1 IMPLANT
ELECT BLADE TIP CTD 4 INCH (ELECTRODE) ×1 IMPLANT
ELECT REM PT RETURN 15FT ADLT (MISCELLANEOUS) ×1 IMPLANT
FACESHIELD WRAPAROUND (MASK) ×2 IMPLANT
GLOVE BIO SURGEON STRL SZ8.5 (GLOVE) ×2 IMPLANT
GLOVE BIOGEL M 7.0 STRL (GLOVE) ×1 IMPLANT
GLOVE BIOGEL PI IND STRL 7.5 (GLOVE) ×2 IMPLANT
GLOVE BIOGEL PI IND STRL 8 (GLOVE) ×1 IMPLANT
GLOVE BIOGEL PI IND STRL 8.5 (GLOVE) ×1 IMPLANT
GLOVE SURG LX STRL 7.5 STRW (GLOVE) ×2 IMPLANT
GOWN SPEC L3 XXLG W/TWL (GOWN DISPOSABLE) ×2 IMPLANT
HANDPIECE INTERPULSE COAX TIP (DISPOSABLE) ×1
HOOD PEEL AWAY T7 (MISCELLANEOUS) ×3 IMPLANT
KIT TURNOVER KIT A (KITS) IMPLANT
MANIFOLD NEPTUNE II (INSTRUMENTS) ×1 IMPLANT
NEEDLE SPNL 18GX3.5 QUINCKE PK (NEEDLE) ×1 IMPLANT
NS IRRIG 1000ML POUR BTL (IV SOLUTION) ×1 IMPLANT
PENCIL SMOKE EVACUATOR (MISCELLANEOUS) ×1 IMPLANT
PROTECTOR NERVE ULNAR (MISCELLANEOUS) ×1 IMPLANT
SAW OSC TIP CART 19.5X105X1.3 (SAW) ×1 IMPLANT
SEALER BIPOLAR AQUA 6.0 (INSTRUMENTS) ×1 IMPLANT
SET HNDPC FAN SPRY TIP SCT (DISPOSABLE) ×1 IMPLANT
SOLUTION PRONTOSAN WOUND 350ML (IRRIGATION / IRRIGATOR) ×1 IMPLANT
SUCTION FRAZIER HANDLE 10FR (MISCELLANEOUS) ×1
SUCTION TUBE FRAZIER 10FR DISP (MISCELLANEOUS) ×1 IMPLANT
SUT ETHIBOND NAB CT1 #1 30IN (SUTURE) ×2 IMPLANT
SUT MNCRL AB 3-0 PS2 18 (SUTURE) ×1 IMPLANT
SUT MON AB 2-0 CT1 36 (SUTURE) ×2 IMPLANT
SUT VIC AB 1 CT1 36 (SUTURE) ×2 IMPLANT
SUT VIC AB 2-0 CT1 27 (SUTURE) ×1
SUT VIC AB 2-0 CT1 TAPERPNT 27 (SUTURE) ×1 IMPLANT
SUT VLOC 180 0 24IN GS25 (SUTURE) ×2 IMPLANT
SWAB COLLECTION DEVICE MRSA (MISCELLANEOUS) IMPLANT
SWAB CULTURE ESWAB REG 1ML (MISCELLANEOUS) IMPLANT
SYR 50ML LL SCALE MARK (SYRINGE) ×1 IMPLANT
TOWEL OR 17X26 10 PK STRL BLUE (TOWEL DISPOSABLE) ×2 IMPLANT
TOWER CARTRIDGE SMART MIX (DISPOSABLE) IMPLANT
TRAY FOLEY MTR SLVR 16FR STAT (SET/KITS/TRAYS/PACK) ×1 IMPLANT
WATER STERILE IRR 1000ML POUR (IV SOLUTION) ×1 IMPLANT

## 2022-07-20 SURGICAL SUPPLY — 56 items
ADH SKN CLS APL DERMABOND .7 (GAUZE/BANDAGES/DRESSINGS) ×2
APL PRP STRL LF DISP 70% ISPRP (MISCELLANEOUS) ×1
BAG COUNTER SPONGE SURGICOUNT (BAG) IMPLANT
BAG DECANTER FOR FLEXI CONT (MISCELLANEOUS) IMPLANT
BAG SPEC THK2 15X12 ZIP CLS (MISCELLANEOUS)
BAG SPNG CNTER NS LX DISP (BAG)
BAG ZIPLOCK 12X15 (MISCELLANEOUS) IMPLANT
CHLORAPREP W/TINT 26 (MISCELLANEOUS) ×1 IMPLANT
COVER PERINEAL POST (MISCELLANEOUS) ×1 IMPLANT
COVER SURGICAL LIGHT HANDLE (MISCELLANEOUS) ×1 IMPLANT
DERMABOND ADVANCED .7 DNX12 (GAUZE/BANDAGES/DRESSINGS) ×2 IMPLANT
DRAPE IMP U-DRAPE 54X76 (DRAPES) ×1 IMPLANT
DRAPE SHEET LG 3/4 BI-LAMINATE (DRAPES) ×3 IMPLANT
DRAPE STERI IOBAN 125X83 (DRAPES) ×1 IMPLANT
DRAPE U-SHAPE 47X51 STRL (DRAPES) ×2 IMPLANT
DRSG AQUACEL AG ADV 3.5X10 (GAUZE/BANDAGES/DRESSINGS) ×1 IMPLANT
ELECT REM PT RETURN 15FT ADLT (MISCELLANEOUS) ×1 IMPLANT
GAUZE SPONGE 4X4 12PLY STRL (GAUZE/BANDAGES/DRESSINGS) ×1 IMPLANT
GLOVE BIO SURGEON STRL SZ8.5 (GLOVE) ×2 IMPLANT
GLOVE BIOGEL M 7.0 STRL (GLOVE) ×1 IMPLANT
GLOVE BIOGEL PI IND STRL 7.5 (GLOVE) ×1 IMPLANT
GLOVE BIOGEL PI IND STRL 8.5 (GLOVE) ×1 IMPLANT
GOWN SPEC L3 XXLG W/TWL (GOWN DISPOSABLE) ×1 IMPLANT
GOWN STRL REUS W/ TWL XL LVL3 (GOWN DISPOSABLE) ×1 IMPLANT
GOWN STRL REUS W/TWL XL LVL3 (GOWN DISPOSABLE) ×1
HANDPIECE INTERPULSE COAX TIP (DISPOSABLE) ×1
HEAD CERAMIC BIOLOX 36 (Head) IMPLANT
HOLDER FOLEY CATH W/STRAP (MISCELLANEOUS) ×1 IMPLANT
HOOD PEEL AWAY T7 (MISCELLANEOUS) ×3 IMPLANT
KIT TURNOVER KIT A (KITS) IMPLANT
LINER ACETAB OFF G7 5 36 +5 (Liner) IMPLANT
MANIFOLD NEPTUNE II (INSTRUMENTS) ×1 IMPLANT
MARKER SKIN DUAL TIP RULER LAB (MISCELLANEOUS) ×1 IMPLANT
NDL SAFETY ECLIP 18X1.5 (MISCELLANEOUS) ×1 IMPLANT
NDL SPNL 18GX3.5 QUINCKE PK (NEEDLE) ×1 IMPLANT
NEEDLE SPNL 18GX3.5 QUINCKE PK (NEEDLE) ×1 IMPLANT
PACK ANTERIOR HIP CUSTOM (KITS) ×1 IMPLANT
PENCIL SMOKE EVACUATOR (MISCELLANEOUS) IMPLANT
SAW OSC TIP CART 19.5X105X1.3 (SAW) ×1 IMPLANT
SEALER BIPOLAR AQUA 6.0 (INSTRUMENTS) ×1 IMPLANT
SET HNDPC FAN SPRY TIP SCT (DISPOSABLE) ×1 IMPLANT
SHELL ACET G7 4H 60 SZG (Shell) IMPLANT
SOLUTION PRONTOSAN WOUND 350ML (IRRIGATION / IRRIGATOR) ×1 IMPLANT
SPIKE FLUID TRANSFER (MISCELLANEOUS) ×1 IMPLANT
STEM FEM CMTLS HIP 22X164 133D (Stem) IMPLANT
SUT MNCRL AB 3-0 PS2 18 (SUTURE) ×1 IMPLANT
SUT MON AB 2-0 CT1 36 (SUTURE) ×1 IMPLANT
SUT STRATAFIX PDO 1 14 VIOLET (SUTURE) ×1
SUT STRATFX PDO 1 14 VIOLET (SUTURE) ×1
SUT VIC AB 2-0 CT1 27 (SUTURE)
SUT VIC AB 2-0 CT1 TAPERPNT 27 (SUTURE) IMPLANT
SUTURE STRATFX PDO 1 14 VIOLET (SUTURE) ×1 IMPLANT
SYR 3ML LL SCALE MARK (SYRINGE) ×1 IMPLANT
TRAY FOLEY MTR SLVR 16FR STAT (SET/KITS/TRAYS/PACK) IMPLANT
TUBE SUCTION HIGH CAP CLEAR NV (SUCTIONS) ×1 IMPLANT
WATER STERILE IRR 1000ML POUR (IV SOLUTION) ×1 IMPLANT

## 2022-07-20 NOTE — Plan of Care (Signed)
  Problem: Education: Goal: Ability to describe self-care measures that may prevent or decrease complications (Diabetes Survival Skills Education) will improve Outcome: Progressing   Problem: Coping: Goal: Ability to adjust to condition or change in health will improve Outcome: Progressing   Problem: Fluid Volume: Goal: Ability to maintain a balanced intake and output will improve Outcome: Progressing   Problem: Health Behavior/Discharge Planning: Goal: Ability to identify and utilize available resources and services will improve Outcome: Progressing   Problem: Nutritional: Goal: Maintenance of adequate nutrition will improve Outcome: Progressing   Problem: Skin Integrity: Goal: Risk for impaired skin integrity will decrease Outcome: Progressing   Problem: Tissue Perfusion: Goal: Adequacy of tissue perfusion will improve Outcome: Progressing

## 2022-07-20 NOTE — Progress Notes (Signed)
Initial Nutrition Assessment  DOCUMENTATION CODES:   Non-severe (moderate) malnutrition in context of chronic illness  INTERVENTION:   -Ensure MAX Protein po BID, each supplement provides 150 kcal and 30 grams of protein   -Multivitamin with minerals daily  NUTRITION DIAGNOSIS:   Moderate Malnutrition related to chronic illness (DM, CHF) as evidenced by moderate fat depletion, mild muscle depletion.  GOAL:   Patient will meet greater than or equal to 90% of their needs  MONITOR:   PO intake, Supplement acceptance, Weight trends, I & O's, Labs  REASON FOR ASSESSMENT:   Consult Hip fracture protocol  ASSESSMENT:   71 y.o. male with medical history significant of IIDM, orthostatic hypotension on midodrine, HTN, HLD, chronic HFpEF, DVT history of presented with fall and left hip pain.  Patient in room, NPO awaiting hip surgery this afternoon.  Pt states he did eat last night. Tends to consume 2 meals a day at home. Mainly breakfast of oatmeal, cereal or eggs and dinner consisting of meat, starch vegetables (this varies). Very rarely will be eat a lunch meal. Does not take any vitamins or supplements. Agreeable to protein supplements following surgery. Will switch order to Ensure Max for more protein.   Per weight records, no weight loss recently. Pt states his UBW is ~210 lbs. Current weight: 195 lbs.  Medications: Colace, Miralax  Labs reviewed: CBGs: 119-265   NUTRITION - FOCUSED PHYSICAL EXAM:  Flowsheet Row Most Recent Value  Orbital Region Moderate depletion  Upper Arm Region Mild depletion  Thoracic and Lumbar Region Unable to assess  Buccal Region Moderate depletion  Temple Region Mild depletion  Clavicle Bone Region Mild depletion  Clavicle and Acromion Bone Region Mild depletion  Scapular Bone Region Mild depletion  Dorsal Hand Mild depletion  Patellar Region Unable to assess  Anterior Thigh Region Unable to assess  Posterior Calf Region Unable to assess   Edema (RD Assessment) None  Hair Reviewed  Eyes Reviewed  Mouth Reviewed  Skin Reviewed  [dry flaky]       Diet Order:   Diet Order             Diet NPO time specified  Diet effective midnight                   EDUCATION NEEDS:   Education needs have been addressed  Skin:  Skin Assessment: Reviewed RN Assessment  Last BM:  10/31  Height:   Ht Readings from Last 1 Encounters:  07/19/22 '6\' 5"'$  (1.956 m)    Weight:   Wt Readings from Last 1 Encounters:  07/19/22 88.5 kg    BMI:  Body mass index is 23.14 kg/m.  Estimated Nutritional Needs:   Kcal:  2200-2400  Protein:  105-115g  Fluid:  2.2L/day  Clayton Bibles, MS, RD, LDN Inpatient Clinical Dietitian Contact information available via Amion

## 2022-07-20 NOTE — Discharge Instructions (Addendum)
 Dr. Brian Swinteck Joint Replacement Specialist [facility] [address] [place], [place] [address] [phone]   TOTAL HIP REPLACEMENT POSTOPERATIVE DIRECTIONS    Hip Rehabilitation, Guidelines Following Surgery   WEIGHT BEARING Weight bearing as tolerated with assist device (walker, cane, etc) as directed, use it as long as suggested by your surgeon or therapist, typically at least 4-6 weeks.  The results of a hip operation are greatly improved after range of motion and muscle strengthening exercises. Follow all safety measures which are given to protect your hip. If any of these exercises cause increased pain or swelling in your joint, decrease the amount until you are comfortable again. Then slowly increase the exercises. Call your caregiver if you have problems or questions.   HOME CARE INSTRUCTIONS  Most of the following instructions are designed to prevent the dislocation of your new hip.  Remove items at home which could result in a fall. This includes throw rugs or furniture in walking pathways.  Continue medications as instructed at time of discharge. You may have some home medications which will be placed on hold until you complete the course of blood thinner medication. You may start showering once you are discharged home. Do not remove your dressing. Do not put on socks or shoes without following the instructions of your caregivers.   Sit on chairs with arms. Use the chair arms to help push yourself up when arising.  Arrange for the use of a toilet seat elevator so you are not sitting low.  Walk with walker as instructed.  You may resume a sexual relationship in one month or when given the OK by your caregiver.  Use walker as long as suggested by your caregivers.  You may put full weight on your legs and walk as much as is comfortable. Avoid periods of inactivity such as sitting longer than an hour when not asleep. This helps prevent blood  clots.  You may return to work once you are cleared by your surgeon.  Do not drive a car for 6 weeks or until released by your surgeon.  Do not drive while taking narcotics.  Wear elastic stockings for two weeks following surgery during the day but you may remove then at night.  Make sure you keep all of your appointments after your operation with all of your doctors and caregivers. You should call the office at the above phone number and make an appointment for approximately two weeks after the date of your surgery. Please pick up a stool softener and laxative for home use as long as you are requiring pain medications. ICE to the affected hip every three hours for 30 minutes at a time and then as needed for pain and swelling. Continue to use ice on the hip for pain and swelling from surgery. You may notice swelling that will progress down to the foot and ankle.  This is normal after surgery.  Elevate the leg when you are not up walking on it.   It is important for you to complete the blood thinner medication as prescribed by your doctor. Continue to use the breathing machine which will help keep your temperature down.  It is common for your temperature to cycle up and down following surgery, especially at night when you are not up moving around and exerting yourself.  The breathing machine keeps your lungs expanded and your temperature down.  RANGE OF MOTION AND STRENGTHENING EXERCISES  These exercises are designed to help you   keep full movement of your hip joint. Follow your caregiver's or physical therapist's instructions. Perform all exercises about fifteen times, three times per day or as directed. Exercise both hips, even if you have had only one joint replacement. These exercises can be done on a training (exercise) mat, on the floor, on a table or on a bed. Use whatever works the best and is most comfortable for you. Use music or television while you are exercising so that the exercises are a  pleasant break in your day. This will make your life better with the exercises acting as a break in routine you can look forward to.  Lying on your back, slowly slide your foot toward your buttocks, raising your knee up off the floor. Then slowly slide your foot back down until your leg is straight again.  Lying on your back spread your legs as far apart as you can without causing discomfort.  Lying on your side, raise your upper leg and foot straight up from the floor as far as is comfortable. Slowly lower the leg and repeat.  Lying on your back, tighten up the muscle in the front of your thigh (quadriceps muscles). You can do this by keeping your leg straight and trying to raise your heel off the floor. This helps strengthen the largest muscle supporting your knee.  Lying on your back, tighten up the muscles of your buttocks both with the legs straight and with the knee bent at a comfortable angle while keeping your heel on the floor.   SKILLED REHAB INSTRUCTIONS: If the patient is transferred to a skilled rehab facility following release from the hospital, a list of the current medications will be sent to the facility for the patient to continue.  When discharged from the skilled rehab facility, please have the facility set up the patient's Home Health Physical Therapy prior to being released. Also, the skilled facility will be responsible for providing the patient with their medications at time of release from the facility to include their pain medication and their blood thinner medication. If the patient is still at the rehab facility at time of the two week follow up appointment, the skilled rehab facility will also need to assist the patient in arranging follow up appointment in our office and any transportation needs.  POST-OPERATIVE OPIOID TAPER INSTRUCTIONS: It is important to wean off of your opioid medication as soon as possible. If you do not need pain medication after your surgery it is ok  to stop day one. Opioids include: Codeine, Hydrocodone(Norco, Vicodin), Oxycodone(Percocet, oxycontin) and hydromorphone amongst others.  Long term and even short term use of opiods can cause: Increased pain response Dependence Constipation Depression Respiratory depression And more.  Withdrawal symptoms can include Flu like symptoms Nausea, vomiting And more Techniques to manage these symptoms Hydrate well Eat regular healthy meals Stay active Use relaxation techniques(deep breathing, meditating, yoga) Do Not substitute Alcohol to help with tapering If you have been on opioids for less than two weeks and do not have pain than it is ok to stop all together.  Plan to wean off of opioids This plan should start within one week post op of your joint replacement. Maintain the same interval or time between taking each dose and first decrease the dose.  Cut the total daily intake of opioids by one tablet each day Next start to increase the time between doses. The last dose that should be eliminated is the evening dose.    MAKE   SURE YOU:  Understand these instructions.  Will watch your condition.  Will get help right away if you are not doing well or get worse.  Pick up stool softner and laxative for home use following surgery while on pain medications. Do not remove your dressing. The dressing is waterproof--it is OK to take showers. Continue to use ice for pain and swelling after surgery. Do not use any lotions or creams on the incision until instructed by your surgeon. Total Hip Protocol.   Information on my medicine - ELIQUIS (apixaban)  This medication education was reviewed with me or my healthcare representative as part of my discharge preparation.    Why was Eliquis prescribed for you? Eliquis was prescribed for you to reduce the risk of blood clots forming after orthopedic surgery.    What do You need to know about Eliquis? Take your Eliquis TWICE DAILY - one  tablet in the morning and one tablet in the evening with or without food.  It would be best to take the dose about the same time each day.  If you have difficulty swallowing the tablet whole please discuss with your pharmacist how to take the medication safely.  Take Eliquis exactly as prescribed by your doctor and DO NOT stop taking Eliquis without talking to the doctor who prescribed the medication.  Stopping without other medication to take the place of Eliquis may increase your risk of developing a clot.  After discharge, you should have regular check-up appointments with your healthcare provider that is prescribing your Eliquis.  What do you do if you miss a dose? If a dose of ELIQUIS is not taken at the scheduled time, take it as soon as possible on the same day and twice-daily administration should be resumed.  The dose should not be doubled to make up for a missed dose.  Do not take more than one tablet of ELIQUIS at the same time.  Important Safety Information A possible side effect of Eliquis is bleeding. You should call your healthcare provider right away if you experience any of the following: Bleeding from an injury or your nose that does not stop. Unusual colored urine (red or dark brown) or unusual colored stools (red or black). Unusual bruising for unknown reasons. A serious fall or if you hit your head (even if there is no bleeding).  Some medicines may interact with Eliquis and might increase your risk of bleeding or clotting while on Eliquis. To help avoid this, consult your healthcare provider or pharmacist prior to using any new prescription or non-prescription medications, including herbals, vitamins, non-steroidal anti-inflammatory drugs (NSAIDs) and supplements.  This website has more information on Eliquis (apixaban): http://www.eliquis.com/eliquis/home  

## 2022-07-20 NOTE — Progress Notes (Signed)
  Progress Note   Patient: Micheal Moore. JFH:[id number] DOB: 07/04/1951 DOA: 07/19/2022     1 DOS: the patient was seen and examined on 07/20/2022   Brief hospital course: 71 y.o. male with medical history significant of IIDM, orthostatic hypotension on midodrine, HTN, HLD, chronic HFpEF, DVT history of presented with fall and left hip pain.   Patient was walking his dog this morning in the yard, and fell down forward while turning around to tend his dog. No LOC, no head injury, he denies any prodromes of lightheadedness blurry vision nauseous shortness of breath.  He has chronic orthostatic hypotension on midodrine and fludrocortisone, he reported occasionally feeling lightheadedness while rising up but denied any such episode happened today.  He had a similar event in June this year when he fractured right femoral neck underwent ORIF.  Assessment and Plan: Left femoral neck fracture, closed -ORIF for today per Ortho -Cont with analgesia as needed -Would f/u on PT/OT recs   Orthostatic hypotension -Appears to be stable, fludrocortisone -Check orthostatic vital signs when able to stand -midodrine d/c as pt is now hypertensive   IIDM with hyperglycemia -Hold off sulfonylurea -continue sliding scale   HLD -On Statin  Likely osteoporosis -Pt describes now multiple fractures involving minor falls -consider bone density outpatient and bisphosphonate       Subjective: Eager to have surgery. Pt seen in the AM  Physical Exam: Vitals:   07/20/22 0147 07/20/22 0604 07/20/22 0813 07/20/22 1355  BP: (!) 141/82 138/89 (!) 154/89 (!) 149/83  Pulse: 96 93 98 95  Resp: '17 17 16 16  '$ Temp: 98.4 F (36.9 C) 98.1 F (36.7 C) 98.8 F (37.1 C) 98.3 F (36.8 C)  TempSrc: Oral Oral Oral Oral  SpO2: 99% 100% 95% 96%  Weight:    88.5 kg  Height:    '6\' 5"'$  (1.956 m)   General exam: Awake, laying in bed, in nad Respiratory system: Normal respiratory effort, no  wheezing Cardiovascular system: regular rate, s1, s2 Gastrointestinal system: Soft, nondistended, positive BS Central nervous system: CN2-12 grossly intact, strength intact Extremities: Perfused, no clubbing Skin: Normal skin turgor, no notable skin lesions seen Psychiatry: Mood normal // no visual hallucinations   Data Reviewed:  Labs reviewed: WBC 9.6, 12.6, Plts 209   Family Communication: Pt in room, family not at bedside  Disposition: Status is: Inpatient Remains inpatient appropriate because: Severity of illness  Planned Discharge Destination:  Pending PT/OT eval    Author: Marylu Lund, MD 07/20/2022 4:56 PM  For on call review [email or web address].

## 2022-07-20 NOTE — Transfer of Care (Signed)
Immediate Anesthesia Transfer of Care Note  Patient: Micheal Moore.  Procedure(s) Performed: TOTAL HIP ARTHROPLASTY ANTERIOR APPROACH (Left: Hip)  Patient Location: PACU  Anesthesia Type:General  Level of Consciousness: awake, alert , and oriented  Airway & Oxygen Therapy: Patient Spontanous Breathing  Post-op Assessment: Report given to RN and Post -op Vital signs reviewed and stable  Post vital signs: Reviewed and stable  Last Vitals:  Vitals Value Taken Time  BP    Temp    Pulse    Resp    SpO2      Last Pain:  Vitals:   07/20/22 1355  TempSrc: Oral  PainSc: 0-No pain      Patients Stated Pain Goal: 0 (37/10/62 6948)  Complications: No notable events documented.

## 2022-07-20 NOTE — Hospital Course (Signed)
71 y.o. male with medical history significant of IIDM, orthostatic hypotension on midodrine, HTN, HLD, chronic HFpEF, DVT history of presented with fall and left hip pain.   Patient was walking his dog this morning in the yard, and fell down forward while turning around to tend his dog. No LOC, no head injury, he denies any prodromes of lightheadedness blurry vision nauseous shortness of breath.  He has chronic orthostatic hypotension on midodrine and fludrocortisone, he reported occasionally feeling lightheadedness while rising up but denied any such episode happened today.  He had a similar event in June this year when he fractured right femoral neck underwent ORIF.

## 2022-07-20 NOTE — Inpatient Diabetes Management (Addendum)
Inpatient Diabetes Program Recommendations  AACE/ADA: New Consensus Statement on Inpatient Glycemic Control   Target Ranges:  Prepandial:   less than 140 mg/dL      Peak postprandial:   less than 180 mg/dL (1-2 hours)      Critically ill patients:  140 - 180 mg/dL    Latest Reference Range & Units 07/19/22 15:53 07/19/22 17:52 07/19/22 22:16 07/20/22 07:54  Glucose-Capillary 70 - 99 mg/dL 159 (H) 265 (H) 242 (H) 203 (H)   Review of Glycemic Control  Diabetes history: DM2 Outpatient Diabetes medications: Jardiance 10 mg daily (has been out of med for few days), Amaryl 2 mg daily, Januvia 50 mg daily (not taking), Metformin 1000 mg BID (not taking) Current orders for Inpatient glycemic control: Metformin 1000 mg BID, Tradjenta 5 mg daily, Novolog 0-15 units TID with meals  Inpatient Diabetes Program Recommendations:    Insulin: Noted patient received one time Decadron 5 mg at 14:55 on 07/19/22. If glucose remains consistently over 180 mg/dl, please consider ordering Semglee 9 units Q24H (based on 88.5 kg x 0.1 units)..  Oral DM medications: Patient reports that he does not want to take Metformin.  NOTE: Patient admitted with hip fracture and planned for surgery today. Patient sees Dr. Forde Dandy (Endocrinologist and PCP); had an appointment scheduled for 07/19/22 but missed it due to being at the hospital with broken hip. Spoke with patient over the phone to inquire about DM medications. Patient reports that he is taking Jardiance 10 mg daily and Amaryl 2 mg daily. Inquired about Januvia (patient states that he does not recall any of his medications called Januvia/sitaglipitin) and Metformin (reports that he told Dr. Forde Dandy he did not want to take it any longer due to things he has heard in the news about it). Patient reports that he is out of Jardiance for a few days (was waiting to get his check before he could get it refilled; has Rx at pharmacy and he plans to go pick it up after discharge).   Discussed importance of good DM control especially following surgery. Discussed current DM medications ordered and patient states he does not want to take the Metformin (will let attending provider know). Informed patient that he will be ordered insulin as an inpatient to improve glucose control. Informed patient that he received Decadron on 11/2 which is contributing to hyperglycemia. Encouraged patient to follow up with Dr. Forde Dandy (will need to call and reschedule), to pick up medications from pharmacy, and take medications as prescribed. Patient verbalized understanding of information discussed and has no questions at this time.  Thanks, Barnie Alderman, RN, MSN, [place] [facility] Coordinator Inpatient Diabetes Program [phone] (Team Pager from 8am to [place])

## 2022-07-20 NOTE — Anesthesia Postprocedure Evaluation (Signed)
Anesthesia Post Note  Patient: Micheal Moore.  Procedure(s) Performed: TOTAL HIP ARTHROPLASTY ANTERIOR APPROACH (Left: Hip)     Patient location during evaluation: PACU Anesthesia Type: General Level of consciousness: awake and alert, patient cooperative and oriented Pain management: pain level controlled Vital Signs Assessment: post-procedure vital signs reviewed and stable Respiratory status: spontaneous breathing, nonlabored ventilation and respiratory function stable Cardiovascular status: blood pressure returned to baseline and stable Postop Assessment: no apparent nausea or vomiting Anesthetic complications: no   No notable events documented.  Last Vitals:  Vitals:   07/20/22 1800 07/20/22 1815  BP: (!) 140/85 (!) 154/77  Pulse: (!) 102 99  Resp: 13 13  Temp:    SpO2: 95% 95%    Last Pain:  Vitals:   07/20/22 1800  TempSrc:   PainSc: 0-No pain                 Rossanna Spitzley,E. Kable Haywood

## 2022-07-20 NOTE — Anesthesia Procedure Notes (Signed)
Procedure Name: Intubation Date/Time: 07/20/2022 3:53 PM  Performed by: Garrel Ridgel, CRNAPre-anesthesia Checklist: Patient identified, Emergency Drugs available, Suction available and Patient being monitored Patient Re-evaluated:Patient Re-evaluated prior to induction Oxygen Delivery Method: Circle system utilized Preoxygenation: Pre-oxygenation with 100% oxygen Induction Type: IV induction Ventilation: Mask ventilation without difficulty Laryngoscope Size: Mac and 4 Grade View: Grade II Tube type: Oral Tube size: 7.5 mm Number of attempts: 1 Airway Equipment and Method: Stylet Placement Confirmation: ETT inserted through vocal cords under direct vision, positive ETCO2 and breath sounds checked- equal and bilateral Secured at: 24 cm Tube secured with: Tape Dental Injury: Teeth and Oropharynx as per pre-operative assessment

## 2022-07-20 NOTE — Anesthesia Preprocedure Evaluation (Addendum)
Anesthesia Evaluation  Patient identified by MRN, date of birth, ID band Patient awake    Reviewed: Allergy & Precautions, NPO status , Patient's Chart, lab work & pertinent test results  History of Anesthesia Complications Negative for: history of anesthetic complications  Airway Mallampati: II  TM Distance: >3 FB Neck ROM: Full    Dental  (+) Poor Dentition, Chipped, Missing, Loose, Dental Advisory Given   Pulmonary Current Smoker and Patient abstained from smoking.   breath sounds clear to auscultation       Cardiovascular (-) hypertension(-) angina + DVT   Rhythm:Regular Rate:Normal  On mitodrine for BP support 12/2021 ECHO:  EF 50-55%. The LV has low normal function. Grade I DD, aortic sclerosis without stenosis    Neuro/Psych negative neurological ROS     GI/Hepatic negative GI ROS, Neg liver ROS,,,  Endo/Other  diabetes (glu 119), Oral Hypoglycemic Agents    Renal/GU negative Renal ROS     Musculoskeletal   Abdominal   Peds  Hematology negative hematology ROS (+)   Anesthesia Other Findings   Reproductive/Obstetrics                             Anesthesia Physical Anesthesia Plan  ASA: 3  Anesthesia Plan: General   Post-op Pain Management: Tylenol PO (pre-op)*   Induction: Intravenous  PONV Risk Score and Plan: 1 and Ondansetron and Dexamethasone  Airway Management Planned: Oral ETT  Additional Equipment: None  Intra-op Plan:   Post-operative Plan: Extubation in OR  Informed Consent: I have reviewed the patients History and Physical, chart, labs and discussed the procedure including the risks, benefits and alternatives for the proposed anesthesia with the patient or authorized representative who has indicated his/her understanding and acceptance.     Dental advisory given  Plan Discussed with: CRNA and Surgeon  Anesthesia Plan Comments: (Plan routine monitors,  GETA  Pt declines SAB)        Anesthesia Quick Evaluation

## 2022-07-20 NOTE — Op Note (Signed)
OPERATIVE REPORT  SURGEON: Rod Can, MD   ASSISTANT: Larene Pickett, PA-C  PREOPERATIVE DIAGNOSIS: Displaced Left femoral neck fracture.   POSTOPERATIVE DIAGNOSIS: Displaced Left femoral neck fracture.   PROCEDURE: Left total hip arthroplasty, anterior approach.   IMPLANTS: Biomet Taperloc Reduced Distal stem, size 22x174m, high offset. Biomet G7 OsseoTi Cup, size 60 mm. Biomet Vivacit-E liner, size 36 mm, G, +5 neutral. Biomet Biolox ceramic head ball, size 36 - 3 mm.  ANESTHESIA:  General  ANTIBIOTICS: 2g ancef.  ESTIMATED BLOOD LOSS:-550 mL    DRAINS: None.  COMPLICATIONS: None   CONDITION: PACU - hemodynamically stable.   BRIEF CLINICAL NOTE: Micheal Moore is a 71y.o. male with a displaced Left femoral neck fracture. The patient was admitted to the hospitalist service and underwent perioperative risk stratification and medical optimization. The risks, benefits, and alternatives to total hip arthroplasty were explained, and the patient elected to proceed.  PROCEDURE IN DETAIL: The patient was taken to the operating room and general anesthesia was induced on the hospital bed.  The patient was then positioned on the Hana table.  All bony prominences were well padded.  The hip was prepped and draped in the normal sterile surgical fashion.  A time-out was called verifying side and site of surgery. Antibiotics were given within 60 minutes of beginning the procedure.   Bikini incision was made, and the direct anterior approach to the hip was performed through the Hueter interval.  Lateral femoral circumflex vessels were treated with the Auqumantys. The anterior capsule was exposed and an inverted T capsulotomy was made.  Fracture hematoma was encountered and evacuated. The patient was found to have a comminuted Left subcapital femoral neck fracture.  I freshened the femoral neck cut with a saw.  I removed the femoral neck fragment.  A corkscrew was placed into the head  and the head was removed.  This was passed to the back table and was measured. The pubofemoral ligament was released subperiosteally to the lesser trochanter.  Acetabular exposure was achieved, and the pulvinar and labrum were excised. Sequential reaming of the acetabulum was then performed up to a size 59 mm reamer under direct visulization. A 60 mm cup was then opened and impacted into place at approximately 40 degrees of abduction and 20 degrees of anteversion. The final polyethylene liner was impacted into place and acetabular osteophytes were removed.    I then gained femoral exposure taking care to protect the abductors and greater trochanter.  This was performed using standard external rotation, extension, and adduction.  A cookie cutter was used to enter the femoral canal, and then the femoral canal finder was placed.  Sequential broaching was performed up to a size 22.  Calcar planer was used on the femoral neck remnant.  I placed a high offset neck and a trial head ball.  The hip was reduced.  Leg lengths and offset were checked fluoroscopically.  The hip was dislocated and trial components were removed.  The final implants were placed, and the hip was reduced.  Fluoroscopy was used to confirm component position and leg lengths.  At 90 degrees of external rotation and full extension, the hip was stable to an anterior directed force.   The wound was copiously irrigated with Irrisept solution and normal saline using pule lavage.  Marcaine solution was injected into the periarticular soft tissue.  The wound was closed in layers using #1 Stratafix for the fascia, 2-0 Vicryl for the subcutaneous fat, 2-0 Monocryl  for the deep dermal layer, and staples + Dermabond for the skin.  Once the glue was fully dried, an Aquacell Ag dressing was applied.  The patient was transported to the recovery room in stable condition.  Sponge, needle, and instrument counts were correct at the end of the case x2.  The patient  tolerated the procedure well and there were no known complications.  Please note that a surgical assistant was a medical necessity for this procedure to perform it in a safe and expeditious manner. Assistant was necessary to provide appropriate retraction of vital neurovascular structures, to prevent femoral fracture, and to allow for anatomic placement of the prosthesis.

## 2022-07-20 NOTE — Consult Note (Signed)
ORTHOPAEDIC CONSULTATION  REQUESTING PHYSICIAN: Donne Hazel, MD  PCP:  [name], MD  Chief Complaint: Left hip injury  HPI: Micheal Moore. is a 71 y.o. male with a past medical history of type IIDM, orthostatic hypotension on midodrine, HTN, HLD, chronic HFpEF, DVT history of presented with fall and left hip pain.  He had a mechanical ground-level fall in his yard.  He was brought to the emergency department, where x-rays revealed a displaced left femoral neck fracture.  He was admitted by the hospitalist service for perioperative restratification and medical optimization.  Orthopedic consultation was placed for management of his left hip fracture.  Patient is known to me for previous right hip fracture status post IM nail.  Past Medical History:  Diagnosis Date   Cancer ([place])    right elbow melanoma   Diabetes mellitus without complication ([place])    Type II   Dizziness    DVT (deep venous thrombosis) ([facility])    right leg   Frequent falls    Near syncope    Syncope    Past Surgical History:  Procedure Laterality Date   AMPUTATION TOE Left 04/28/2020   Procedure: AMPUTATION LEFT GREAT TOE;  Surgeon: Newt Minion, MD;  Location: [facility];  Service: Orthopedics;  Laterality: Left;   INTRAMEDULLARY (IM) NAIL INTERTROCHANTERIC Right 02/23/2022   Procedure: INTRAMEDULLARY (IM) NAIL INTERTROCHANTRIC, RIGHT;  Surgeon: Rod Can, MD;  Location: [facility];  Service: Orthopedics;  Laterality: Right;   MELANOMA EXCISION WITH SENTINEL LYMPH NODE BIOPSY Right 08/19/2020   Procedure: WIDE LOCAL EXCISION RIGHT ELBOW MELANOMA WITH RIGHT AXILLARY SENTINEL LYMPH NODE BIOPSY;  Surgeon: Dwan Bolt, MD;  Location: [facility];  Service: General;  Laterality: Right;  2nd incision in axilla   Social History   Socioeconomic History   Marital status: Married    Spouse name: [name]   Number of children: 1   Years of education: Not on file   Highest education level: Associate degree:  occupational, Hotel manager, or vocational program  Occupational History    Comment: retired  Tobacco Use   Smoking status: Every Day    Packs/day: 0.20    Years: 19.00    Total pack years: 3.80    Types: Cigarettes   Smokeless tobacco: Never  Vaping Use   Vaping Use: Never used  Substance and Sexual Activity   Alcohol use: No   Drug use: Not Currently   Sexual activity: Not on file  Other Topics Concern   Not on file  Social History Narrative   Lives with wife   Social Determinants of Health   Financial Resource Strain: Not on file  Food Insecurity: No Food Insecurity (07/19/2022)   Hunger Vital Sign    Worried About Running Out of Food in the Last Year: Never true    Ran Out of Food in the Last Year: Never true  Transportation Needs: No Transportation Needs (07/19/2022)   PRAPARE - Hydrologist (Medical): No    Lack of Transportation (Non-Medical): No  Physical Activity: Not on file  Stress: Not on file  Social Connections: Not on file   Family History  Problem Relation Age of Onset   Stroke Mother    Diabetes Father    Stroke Sister    Diabetes Sister    No Known Allergies Prior to Admission medications   Medication Sig Start Date End Date Taking? Authorizing Provider  fludrocortisone (FLORINEF) 0.1 MG tablet Take  2 tablets (0.2 mg total) by mouth daily. 03/05/22  Yes Lavina Hamman, MD  glimepiride (AMARYL) 2 MG tablet Take 2 mg by mouth daily.  01/26/19  Yes [provider]  JARDIANCE 10 MG TABS tablet Take 10 mg by mouth daily. 07/05/22  Yes [provider]  midodrine (PROAMATINE) 5 MG tablet Take 1 tablet (5 mg total) by mouth 3 (three) times daily with meals. Patient taking differently: Take 2.5 mg by mouth 3 (three) times daily with meals. 03/05/22  Yes Lavina Hamman, MD  rosuvastatin (CRESTOR) 5 MG tablet Take 5 mg by mouth See admin instructions. Take one tablet by mouth on Tuesday & Saturdays 03/26/20  Yes [provider]  docusate sodium (COLACE) 100 MG capsule Take 1 capsule (100 mg total) by mouth 2 (two) times daily. Patient not taking: Reported on 07/19/2022 03/05/22   Lavina Hamman, MD  feeding supplement, GLUCERNA SHAKE, (GLUCERNA SHAKE) LIQD Take 237 mLs by mouth 3 (three) times daily between meals. Patient not taking: Reported on 07/19/2022 03/05/22   Lavina Hamman, MD  metFORMIN (GLUCOPHAGE) 500 MG tablet Take 2 tablets (1,000 mg total) by mouth 2 (two) times daily. Patient not taking: Reported on 07/19/2022 04/29/20   Geradine Girt, DO  nicotine (NICODERM CQ - DOSED IN MG/24 HOURS) 14 mg/24hr patch Place 1 patch (14 mg total) onto the skin daily. Patient not taking: Reported on 07/19/2022 03/05/22   Lavina Hamman, MD  polyethylene glycol (MIRALAX / GLYCOLAX) 17 g packet Take 17 g by mouth daily. Patient not taking: Reported on 07/19/2022 03/05/22   Lavina Hamman, MD  sitaGLIPtin (JANUVIA) 50 MG tablet Take 1 tablet (50 mg total) by mouth daily. Patient not taking: Reported on 07/19/2022 03/05/22   Lavina Hamman, MD   DG Knee Left Port  Result Date: 07/19/2022 CLINICAL DATA:  Left femoral neck fracture, fell, left hip pain EXAM: PORTABLE LEFT KNEE - 1-2 VIEW COMPARISON:  07/19/2022 FINDINGS: Frontal and lateral views of the left knee are obtained on 2 images. There are no acute fractures, subluxation, or dislocation. There is mild 3 compartmental osteoarthritis greatest medially. Well corticated ossific density adjacent to the medial tibial plateau likely represent sequela from prior trauma. No joint effusion. Soft tissues are unremarkable. IMPRESSION: 1. No acute fracture. 2. Mild 3 compartmental osteoarthritis greatest medially. Electronically Signed   By: Randa Ngo M.D.   On: 07/19/2022 13:24   CT Head Wo Contrast  Result Date: 07/19/2022 CLINICAL DATA:  Unwitnessed fall. EXAM: CT HEAD WITHOUT CONTRAST CT CERVICAL SPINE WITHOUT CONTRAST TECHNIQUE: Multidetector CT imaging of the  head and cervical spine was performed following the standard protocol without intravenous contrast. Multiplanar CT image reconstructions of the cervical spine were also generated. RADIATION DOSE REDUCTION: This exam was performed according to the departmental dose-optimization program which includes automated exposure control, adjustment of the mA and/or kV according to patient size and/or use of iterative reconstruction technique. COMPARISON:  February 22, 2022.  Jan 21, 2021. FINDINGS: CT HEAD FINDINGS Brain: No evidence of acute infarction, hemorrhage, hydrocephalus, extra-axial collection or mass lesion/mass effect. Vascular: No hyperdense vessel or unexpected calcification. Skull: Normal. Negative for fracture or focal lesion. Sinuses/Orbits: No acute finding. Other: None. CT CERVICAL SPINE FINDINGS Alignment: Minimal grade 1 anterolisthesis of C4-5 is noted secondary to posterior facet joint hypertrophy. Skull base and vertebrae: No acute fracture. No primary bone lesion or focal pathologic process. Soft tissues and spinal canal: No prevertebral fluid or  swelling. No visible canal hematoma. Disc levels: Moderate degenerative disc disease is noted at C5-6 and C6-7 with anterior osteophyte formation. Upper chest: Negative. Other: Significant degenerative changes seen involving the left-sided posterior facet joint of C2-3 as well as the right-sided posterior facet joint of C4-5. IMPRESSION: No acute intracranial abnormality seen. Multilevel degenerative disc disease noted the cervical spine. No acute abnormality seen. Electronically Signed   By: Marijo Conception M.D.   On: 07/19/2022 12:29   CT Cervical Spine Wo Contrast  Result Date: 07/19/2022 CLINICAL DATA:  Unwitnessed fall. EXAM: CT HEAD WITHOUT CONTRAST CT CERVICAL SPINE WITHOUT CONTRAST TECHNIQUE: Multidetector CT imaging of the head and cervical spine was performed following the standard protocol without intravenous contrast. Multiplanar CT image  reconstructions of the cervical spine were also generated. RADIATION DOSE REDUCTION: This exam was performed according to the departmental dose-optimization program which includes automated exposure control, adjustment of the mA and/or kV according to patient size and/or use of iterative reconstruction technique. COMPARISON:  February 22, 2022.  Jan 21, 2021. FINDINGS: CT HEAD FINDINGS Brain: No evidence of acute infarction, hemorrhage, hydrocephalus, extra-axial collection or mass lesion/mass effect. Vascular: No hyperdense vessel or unexpected calcification. Skull: Normal. Negative for fracture or focal lesion. Sinuses/Orbits: No acute finding. Other: None. CT CERVICAL SPINE FINDINGS Alignment: Minimal grade 1 anterolisthesis of C4-5 is noted secondary to posterior facet joint hypertrophy. Skull base and vertebrae: No acute fracture. No primary bone lesion or focal pathologic process. Soft tissues and spinal canal: No prevertebral fluid or swelling. No visible canal hematoma. Disc levels: Moderate degenerative disc disease is noted at C5-6 and C6-7 with anterior osteophyte formation. Upper chest: Negative. Other: Significant degenerative changes seen involving the left-sided posterior facet joint of C2-3 as well as the right-sided posterior facet joint of C4-5. IMPRESSION: No acute intracranial abnormality seen. Multilevel degenerative disc disease noted the cervical spine. No acute abnormality seen. Electronically Signed   By: Marijo Conception M.D.   On: 07/19/2022 12:29   DG Chest 1 View  Result Date: 07/19/2022 CLINICAL DATA:  Fall.  Left hip pain. EXAM: CHEST  1 VIEW COMPARISON:  Chest two views 02/22/2022 and 01/21/2021 FINDINGS: Cardiac silhouette and mediastinal contours are within normal limits. Mild chronic interstitial thickening/scarring is similar to multiple prior studies. This is again greatest within the right lower lung. No pleural effusion or pneumothorax. No acute skeletal abnormality. IMPRESSION:  Mild chronic interstitial thickening/scarring is similar to multiple prior studies. No acute lung process. Electronically Signed   By: Yvonne Kendall M.D.   On: 07/19/2022 12:20   DG Hip Unilat W or Wo Pelvis 2-3 Views Left  Result Date: 07/19/2022 CLINICAL DATA:  Fall.  Left hip pain. EXAM: DG HIP (WITH OR WITHOUT PELVIS) 2-3V LEFT COMPARISON:  AP pelvis 02/23/2022 FINDINGS: There is new mild cortical step-off of the proximal left femoral neck indicating an acute fracture with minimal approximate 4 mm superior displacement of the distal fracture component with respect to the proximal fracture component. Partial visualization of right cephalomedullary nail fixation of proximal right femoral intertrochanteric fracture with progressive moderate healing bridging sclerosis and peripheral callus formation. No evidence of hardware failure. Moderate bilateral femoroacetabular joint space narrowing. No dislocation. There is diffuse decreased bone mineralization. Moderate multilevel degenerative disc changes at L3-4 through L5-S1. IMPRESSION: 1. Acute minimally displaced fracture of the proximal left femoral neck. 2. Partial visualization of right cephalomedullary nail fixation of proximal right femoral intertrochanteric fracture without evidence of hardware failure. Electronically Signed  By: Yvonne Kendall M.D.   On: 07/19/2022 12:09    Positive ROS: All other systems have been reviewed and were otherwise negative with the exception of those mentioned in the HPI and as above.  Physical Exam: General: Alert, no acute distress Cardiovascular: No pedal edema Respiratory: No cyanosis, no use of accessory musculature GI: No organomegaly, abdomen is soft and non-tender Skin: No lesions in the area of chief complaint Neurologic: Sensation intact distally Psychiatric: Patient is competent for consent with normal mood and affect Lymphatic: No axillary or cervical lymphadenopathy  MUSCULOSKELETAL: Examination of  left hip reveals no skin wounds or lesions.  He is shortened and externally rotated.  Pain with logrolling of the hip.  Unable to perform a straight leg raise.  Foot is well-perfused.  Positive motor function dorsiflexion, plantarflexion, and great toe extension.  Status post amputation of left great toe.  Stocking glove sensory change.  Assessment: Displaced left femoral neck fracture  Plan: I discussed the findings with the patient.  He has a displaced left femoral neck fracture.  I recommend left total hip arthroplasty for pain control and immediate mobilization out of bed.  We discussed the risk, benefits, and alternatives.  Plan for surgery today.  All questions solicited and answered.  The risks, benefits, and alternatives were discussed with the patient. There are risks associated with the surgery including, but not limited to, problems with anesthesia (death), infection, instability (giving out of the joint), dislocation, differences in leg length/angulation/rotation, fracture of bones, loosening or failure of implants, hematoma (blood accumulation) which may require surgical drainage, blood clots, pulmonary embolism, nerve injury (foot drop and lateral thigh numbness), and blood vessel injury. The patient understands these risks and elects to proceed.   Bertram Savin, MD [phone]    07/20/2022 3:24 PM

## 2022-07-20 NOTE — Plan of Care (Signed)
Plan of care initiated and discussed. 

## 2022-07-21 DIAGNOSIS — W19XXXA Unspecified fall, initial encounter: Secondary | ICD-10-CM | POA: Diagnosis not present

## 2022-07-21 DIAGNOSIS — S72002A Fracture of unspecified part of neck of left femur, initial encounter for closed fracture: Secondary | ICD-10-CM | POA: Diagnosis not present

## 2022-07-21 LAB — CBC
HCT: 30.3 % — ABNORMAL LOW (ref 39.0–52.0)
Hemoglobin: 9.9 g/dL — ABNORMAL LOW (ref 13.0–17.0)
MCH: 31 pg (ref 26.0–34.0)
MCHC: 32.7 g/dL (ref 30.0–36.0)
MCV: 95 fL (ref 80.0–100.0)
Platelets: 150 10*3/uL (ref 150–400)
RBC: 3.19 MIL/uL — ABNORMAL LOW (ref 4.22–5.81)
RDW: 14.1 % (ref 11.5–15.5)
WBC: 7.3 10*3/uL (ref 4.0–10.5)
nRBC: 0 % (ref 0.0–0.2)

## 2022-07-21 LAB — GLUCOSE, CAPILLARY
Glucose-Capillary: 187 mg/dL — ABNORMAL HIGH (ref 70–99)
Glucose-Capillary: 222 mg/dL — ABNORMAL HIGH (ref 70–99)
Glucose-Capillary: 190 mg/dL — ABNORMAL HIGH (ref 70–99)
Glucose-Capillary: 263 mg/dL — ABNORMAL HIGH (ref 70–99)

## 2022-07-21 LAB — BASIC METABOLIC PANEL
Anion gap: 8 (ref 5–15)
BUN: 26 mg/dL — ABNORMAL HIGH (ref 8–23)
CO2: 26 mmol/L (ref 22–32)
Calcium: 8.4 mg/dL — ABNORMAL LOW (ref 8.9–10.3)
Chloride: 102 mmol/L (ref 98–111)
Creatinine, Ser: 0.93 mg/dL (ref 0.61–1.24)
GFR, Estimated: >60 mL/min (ref >60)
Glucose, Bld: 291 mg/dL — ABNORMAL HIGH (ref 70–99)
Potassium: 4.1 mmol/L (ref 3.5–5.1)
Sodium: 136 mmol/L (ref 135–145)

## 2022-07-21 MED ORDER — LABETALOL HCL 5 MG/ML IV SOLN: 5.0000 mg | INTRAVENOUS | Status: DC | PRN

## 2022-07-21 MED ORDER — METOPROLOL TARTRATE 12.5 MG HALF TABLET
12.5000 mg | ORAL_TABLET | Freq: Two times a day (BID) | ORAL | Status: DC
  Administered 2022-07-21 – 2022-07-25 (×8): 12.5 mg via ORAL
  Filled 2022-07-21 (×8): qty 1

## 2022-07-21 NOTE — Progress Notes (Signed)
  Progress Note   Patient: Micheal Moore. TOI:[id number] DOB: 1951-02-26 DOA: 07/19/2022     2 DOS: the patient was seen and examined on 07/21/2022   Brief hospital course: 71 y.o. male with medical history significant of IIDM, orthostatic hypotension on midodrine, HTN, HLD, chronic HFpEF, DVT history of presented with fall and left hip pain.   Patient was walking his dog this morning in the yard, and fell down forward while turning around to tend his dog. No LOC, no head injury, he denies any prodromes of lightheadedness blurry vision nauseous shortness of breath.  He has chronic orthostatic hypotension on midodrine and fludrocortisone, he reported occasionally feeling lightheadedness while rising up but denied any such episode happened today.  He had a similar event in June this year when he fractured right femoral neck underwent ORIF.  Assessment and Plan: Left femoral neck fracture, closed -Pt underwent L total hip arthoplasty 11/3 per Ortho -Cont with analgesia as needed -Would f/u on PT/OT recs   Orthostatic hypotension -Appears to be stable, had been on fludrocortisone -Appears to be euvolemic on exam -midodrine was d/c as pt was hypertensive -recommend compression hose and abd binder   IIDM with hyperglycemia -Hold off sulfonylurea -continue sliding scale while in hospital   HLD -On Statin  Likely osteoporosis -Pt describes now multiple fractures involving seemingly minor injuries -consider bone density and starting bisphosphonate as outpatient      Subjective: Without complaints this AM  Physical Exam: Vitals:   07/21/22 0939 07/21/22 0946 07/21/22 1000 07/21/22 1320  BP: 132/67 (!) 118/90  118/71  Pulse:  (!) 119 96 (!) 107  Resp:    20  Temp:    99.5 F (37.5 C)  TempSrc:      SpO2:      Weight:      Height:       General exam: Conversant, in no acute distress Respiratory system: normal chest rise, clear, no audible wheezing Cardiovascular  system: regular rhythm, s1-s2 Gastrointestinal system: Nondistended, nontender, pos BS Central nervous system: No seizures, no tremors Extremities: No cyanosis, no joint deformities Skin: No rashes, no pallor Psychiatry: Affect normal // no auditory hallucinations   Data Reviewed:  Labs reviewed: WBC 7.3, 9.9, Plts 150   Family Communication: Pt in room, family not at bedside  Disposition: Status is: Inpatient Remains inpatient appropriate because: Severity of illness  Planned Discharge Destination: Skilled nursing facility    Author: Marylu Lund, MD 07/21/2022 2:50 PM  For on call review [email or web address].

## 2022-07-21 NOTE — Progress Notes (Signed)
   Subjective: 1 Day Post-Op Procedure(s) (LRB): TOTAL HIP ARTHROPLASTY ANTERIOR APPROACH (Left)  Pt c/o mild soreness this morning but has not moved the hip/leg yet Denies any new symptoms or issues Plan for therapy today Patient reports pain as mild.  Objective:   VITALS:   Vitals:   07/21/22 0113 07/21/22 0618  BP: (!) 147/87 (!) 149/89  Pulse: 93 96  Resp: 18 18  Temp: 98.4 F (36.9 C) 98.3 F (36.8 C)  SpO2: 97% 100%    Left hip incision healing well No drainage or erythema Nv intact distally No rashes or edema distally   LABS Recent Labs    07/19/22 1146 07/20/22 0349 07/21/22 0356  HGB 12.7* 12.5* 9.9*  HCT 39.6 37.9* 30.3*  WBC 7.9 9.6 7.3  PLT 220 209 150    Recent Labs    07/19/22 1146 07/21/22 0356  NA 139 136  K 3.9 4.1  BUN 14 26*  CREATININE 0.94 0.93  GLUCOSE 237* 291*     Assessment/Plan: 1 Day Post-Op Procedure(s) (LRB): TOTAL HIP ARTHROPLASTY ANTERIOR APPROACH (Left) Continue PT/OT Pain management Pulmonary toilet Will continue to monitor his progress     Merla Riches PA-C, [facility] now [employer] [address] [place], [place], [place] [address] Phone: [phone] [email or web address]  [employer]

## 2022-07-21 NOTE — Evaluation (Addendum)
Occupational Therapy Evaluation Patient Details Name: Micheal Moore. MRN: [id number] DOB: [birth year] Today's Date: 07/21/2022   History of Present Illness Micheal Weida. is a 71 y.o. male with medical history significant of IIDM, orthostatic hypotension on midodrine, HTN, HLD, chronic HFpEF, DVT history of presented with fall and left hip pain. Patient s/p ORIF left femoral neck fracture.   Clinical Impression   Mr. Micheal Moore is a 71 year old man s/p hip fracture repair with decreased ROM and strength of RLE, impaired balance, decreased activity tolerance and intermittent complaints of pain resulting in a sudden decline in functional mobility and ability to perform independent ADLs. Patient mod assist for sit to stand, min assist for ambulation with walker, and mod-max assist for LB ADLs. Patient's BP trended down during evaluation from 161/85 to 118/90 after ambulation. Only complained of mild dizziness - he has a history of being orthostatic. Patient will benefit from skilled OT services while in hospital to improve deficits and learn compensatory strategies as needed in order to return to PLOF.  Patient unsure if he can manage at home - steps and difficult home environment so SNF is the more appropriate discharge recommendation. However, patient wants to see how he does and talk to his wife about going home. Will follow update POC as needed.        Recommendations for follow up therapy are one component of a multi-disciplinary discharge planning process, led by the attending physician.  Recommendations may be updated based on patient status, additional functional criteria and insurance authorization.   Follow Up Recommendations  Skilled nursing-short term rehab (<3 hours/day) (vs HH, pending progress)    Assistance Recommended at Discharge Frequent or constant Supervision/Assistance  Patient can return home with the following A little help with walking and/or  transfers;A lot of help with bathing/dressing/bathroom;Assistance with cooking/housework;Help with stairs or ramp for entrance    Functional Status Assessment  Patient has had a recent decline in their functional status and demonstrates the ability to make significant improvements in function in a reasonable and predictable amount of time.  Equipment Recommendations  BSC/3in1    Recommendations for Other Services       Precautions / Restrictions Precautions Precautions: Fall Precaution Comments: monitor BP - orthostatic at baseline Restrictions Weight Bearing Restrictions: No Other Position/Activity Restrictions: WBAT L LE      Mobility Bed Mobility                    Transfers                          Balance Overall balance assessment: Needs assistance Sitting-balance support: No upper extremity supported, Feet supported Sitting balance-[name]: Fair     Standing balance support: During functional activity, Reliant on assistive device for balance Standing balance-[name]: Poor                             ADL either performed or assessed with clinical judgement   ADL Overall ADL's : Needs assistance/impaired Eating/Feeding: Independent   Grooming: Set up;Sitting   Upper Body Bathing: Set up;Sitting   Lower Body Bathing: Moderate assistance;Sit to/from stand   Upper Body Dressing : Set up;Sitting   Lower Body Dressing: Maximal assistance;Sit to/from stand   Toilet Transfer: Moderate assistance;BSC/3in1;Rolling walker (2 wheels)   Toileting- Clothing Manipulation and Hygiene: Moderate assistance;Sit to/from stand  Functional mobility during ADLs: Minimal assistance;+2 for safety/equipment;Rolling walker (2 wheels)       Vision Patient Visual Report: No change from baseline       Perception     Praxis      Pertinent Vitals/Pain Pain Assessment Pain Assessment: Faces Faces Pain Scale: Hurts even  more Pain Location: L hip Pain Descriptors / Indicators: Grimacing, Sore, Discomfort Pain Intervention(s): Monitored during session, Limited activity within patient's tolerance     Hand Dominance     Extremity/Trunk Assessment Upper Extremity Assessment Upper Extremity Assessment: RUE deficits/detail;LUE deficits/detail RUE Deficits / Details: WFL ROM, shoulder 4-/5 strength otherwise 4/5 strength RUE Sensation: WNL RUE Coordination: WNL LUE Deficits / Details: WFL ROM, 4/5 shoulder strength otherwise 5/5 strength LUE Sensation: WNL LUE Coordination: WNL   Lower Extremity Assessment Lower Extremity Assessment: Defer to PT evaluation LLE Deficits / Details: able to perform AROM ankle PF/DF. Fair quad set strength.   Cervical / Trunk Assessment Cervical / Trunk Assessment: Normal   Communication Communication Communication: No difficulties   Cognition Arousal/Alertness: Awake/alert Behavior During Therapy: WFL for tasks assessed/performed Overall Cognitive Status: Within Functional Limits for tasks assessed                                       General Comments  Blood pressure trended down during evaluation but patient only reported mild lightheadedness: BP 162/85 (sup), 143/87 (sit), 132/61 (stand), 118/90 (after ambulation)    Exercises     Shoulder Instructions      Home Living Family/patient expects to be discharged to:: Private residence Living Arrangements: Spouse/significant other Available Help at Discharge: Family;Friend(s) Type of Home: House Home Access: Stairs to enter [employer] of Steps: 2-3 steps to porch and one into house Entrance Stairs-Rails: None Home Layout: One level     Bathroom Shower/Tub: Teacher, early years/pre: Standard     Home Equipment: Conservation officer, nature (2 wheels)   Additional Comments: sister can assist as needed, lives nearby.      Prior Functioning/Environment Prior Level of Function :  Independent/Modified Independent             Mobility Comments: was using RW since last d/c, but pt reports it was scratching floors. Uses PRN. Went to rehab for 21days following last admission          OT Problem List: Decreased strength;Decreased range of motion;Decreased activity tolerance;Impaired balance (sitting and/or standing);Pain;Decreased knowledge of use of DME or AE      OT Treatment/Interventions: Self-care/ADL training;Therapeutic exercise;DME and/or AE instruction;Therapeutic activities;Balance training;Patient/family education    OT Goals(Current goals can be found in the care plan section) Acute Rehab OT Goals Patient Stated Goal: less pain OT Goal Formulation: With patient Time For Goal Achievement: 08/04/22 Potential to Achieve Goals: Good ADL Goals Pt Will Perform Lower Body Dressing: with supervision;with adaptive equipment;sit to/from stand Pt Will Transfer to Toilet: with supervision;ambulating;bedside commode;grab bars Pt Will Perform Toileting - Clothing Manipulation and hygiene: with supervision;sit to/from stand  OT Frequency: Min 2X/week    Co-evaluation PT/OT/SLP Co-Evaluation/Treatment: Yes Reason for Co-Treatment: For patient/therapist safety;To address functional/ADL transfers PT goals addressed during session: Mobility/safety with mobility;Proper use of DME        AM-PAC OT "6 Clicks" Daily Activity     Outcome Measure Help from another person eating meals?: None Help from another person taking care of personal grooming?: A Little Help  from another person toileting, which includes using toliet, bedpan, or urinal?: A Lot Help from another person bathing (including washing, rinsing, drying)?: A Lot Help from another person to put on and taking off regular upper body clothing?: A Little Help from another person to put on and taking off regular lower body clothing?: A Lot 6 Click Score: 16   End of Session Equipment Utilized During  Treatment: Rolling walker (2 wheels);Gait belt Nurse Communication: Mobility status  Activity Tolerance: Patient tolerated treatment well Patient left: in chair;with call bell/phone within reach;with chair alarm set  OT Visit Diagnosis: Other abnormalities of gait and mobility (R26.89);Pain Pain - Right/Left: Left Pain - part of body: Hip                Time: 4650-3546 OT Time Calculation (min): 24 min Charges:  OT General Charges $OT Visit: 1 Visit  Gustavo Lah, OTR/L [place]  Office [phone]   Lenward Chancellor 07/21/2022, 10:35 AM

## 2022-07-21 NOTE — Evaluation (Addendum)
Physical Therapy Evaluation Patient Details Name: Micheal Moore. MRN: [id number] DOB: [birth year] Today's Date: 07/21/2022  History of Present Illness  Micheal Moore. is a 71 y.o. male with medical history significant of IIDM, orthostatic hypotension on midodrine, HTN, HLD, chronic HFpEF, DVT history of presented with fall and left hip pain. Patient s/p ORIF left femoral neck fracture.  Clinical Impression   Pt is a 71 y.o. male s/p left total hip arthroplasty, anterior approach resulting in the deficits listed below (see PT Problem List). Pt performed sit to stand transfers with MOD A and ambulated total of ~67f with MIN A and use of RW. Cues provided for step to gait patten with no LOB observed. Blood pressure trended down during evaluation but patient only reported mild lightheadedness: BP 162/85 (sup), 143/87 (sit), 132/61 (stand), 118/90 (after ambulation). Patient is unsure if he can manage at home - has steps to enter and difficult home environment, SNF is the more appropriate discharge recommendation at this time. Patient reports that he wants to see how he does with progression and talk to his wife about going home. Pt will benefit from continued skilled PT to increase their independence and maximize safety with mobility.         Recommendations for follow up therapy are one component of a multi-disciplinary discharge planning process, led by the attending physician.  Recommendations may be updated based on patient status, additional functional criteria and insurance authorization.  Follow Up Recommendations Skilled nursing-short term rehab (<3 hours/day) (vs HHPT pending progress) Can patient physically be transported by private vehicle: Yes    Assistance Recommended at Discharge Frequent or constant Supervision/Assistance  Patient can return home with the following  A little help with walking and/or transfers;A little help with bathing/dressing/bathroom;Assist for  transportation;Help with stairs or ramp for entrance    Equipment Recommendations None recommended by PT  Recommendations for Other Services       Functional Status Assessment Patient has had a recent decline in their functional status and demonstrates the ability to make significant improvements in function in a reasonable and predictable amount of time.     Precautions / Restrictions Precautions Precautions: Fall Precaution Comments: monitor BP - orthostatic at baseline Restrictions Weight Bearing Restrictions: No Other Position/Activity Restrictions: WBAT L LE      Mobility  Bed Mobility Overal bed mobility: Needs Assistance Bed Mobility: Supine to Sit     Supine to sit: Min assist, HOB elevated     General bed mobility comments: assist for progression of L LE to EOB and for trunk to upright    Transfers Overall transfer level: Needs assistance Equipment used: Rolling walker (2 wheels) Transfers: Sit to/from Stand Sit to Stand: Mod assist           General transfer comment: cues for hand placement    Ambulation/Gait Ambulation/Gait assistance: Min assist Gait Distance (Feet): 50 Feet Assistive device: Rolling walker (2 wheels) Gait Pattern/deviations: Step-to pattern, Decreased stride length, Decreased weight shift to left, Knee flexed in stance - left, Trunk flexed Gait velocity: decr     General Gait Details: cues for increased knee extension on L LE in stance, and upright posture- pt reports history of cervical pain. Cues for step to gait pattern.  Stairs            Wheelchair Mobility    Modified Rankin (Stroke Patients Only)       Balance Overall balance assessment: Needs assistance Sitting-balance support: No upper extremity  supported, Feet supported Sitting balance-[name]: Fair     Standing balance support: During functional activity, Reliant on assistive device for balance Standing balance-[name]: Poor                                Pertinent Vitals/Pain Pain Assessment Pain Assessment: Faces Faces Pain Scale: Hurts even more Pain Location: L hip Pain Descriptors / Indicators: Grimacing, Sore, Discomfort Pain Intervention(s): Monitored during session, Repositioned, Ice applied, Limited activity within patient's tolerance    Home Living Family/patient expects to be discharged to:: Private residence Living Arrangements: Spouse/significant other Available Help at Discharge: Family;Friend(s) Type of Home: House Home Access: Stairs to enter Entrance Stairs-Rails: None Entrance Stairs-Number of Steps: 2-3 steps to porch and one into house   Home Layout: One level Home Equipment: Conservation officer, nature (2 wheels) Additional Comments: sister can assist as needed, lives nearby.    Prior Function Prior Level of Function : Independent/Modified Independent             Mobility Comments: was using RW since last d/c, but pt reports it was scratching floors. Uses PRN. Went to rehab for 21days following last admission       Hand Dominance        Extremity/Trunk Assessment   Upper Extremity Assessment Upper Extremity Assessment: Defer to OT evaluation RUE Deficits / Details: WFL ROM, shoulder 4-/5 strength otherwise 4/5 strength RUE Sensation: WNL RUE Coordination: WNL LUE Deficits / Details: WFL ROM, 4/5 shoulder strength otherwise 5/5 strength LUE Sensation: WNL LUE Coordination: WNL    Lower Extremity Assessment Lower Extremity Assessment: LLE deficits/detail LLE Deficits / Details: able to perform AROM ankle PF/DF. Fair quad set strength.    Cervical / Trunk Assessment Cervical / Trunk Assessment: Normal  Communication   Communication: No difficulties  Cognition Arousal/Alertness: Awake/alert Behavior During Therapy: WFL for tasks assessed/performed Overall Cognitive Status: Within Functional Limits for tasks assessed                                           General Comments General comments (skin integrity, edema, etc.): Blood pressure trended down during evaluation but patient only reported mild lightheadedness: BP 162/85 (sup), 143/87 (sit), 132/61 (stand), 118/90 (after ambulation)    Exercises     Assessment/Plan    PT Assessment Patient needs continued PT services  PT Problem List Decreased strength;Decreased range of motion;Decreased activity tolerance;Decreased balance;Decreased mobility;Pain       PT Treatment Interventions DME instruction;Gait training;Stair training;Functional mobility training;Therapeutic activities;Therapeutic exercise;Balance training;Patient/family education    PT Goals (Current goals can be found in the Care Plan section)  Acute Rehab PT Goals Patient Stated Goal: less pain PT Goal Formulation: With patient Time For Goal Achievement: 08/04/22 Potential to Achieve Goals: Good    Frequency Min 2X/week     Co-evaluation PT/OT/SLP Co-Evaluation/Treatment: Yes Reason for Co-Treatment: For patient/therapist safety;To address functional/ADL transfers PT goals addressed during session: Mobility/safety with mobility;Proper use of DME         AM-PAC PT "6 Clicks" Mobility  Outcome Measure Help needed turning from your back to your side while in a flat bed without using bedrails?: None Help needed moving from lying on your back to sitting on the side of a flat bed without using bedrails?: A Lot Help needed moving to and from a  bed to a chair (including a wheelchair)?: A Lot Help needed standing up from a chair using your arms (e.g., wheelchair or bedside chair)?: A Lot Help needed to walk in hospital room?: A Little Help needed climbing 3-5 steps with a railing? : A Lot 6 Click Score: 15    End of Session Equipment Utilized During Treatment: Gait belt Activity Tolerance: Patient tolerated treatment well Patient left: in chair;with call bell/phone within reach;with nursing/sitter in room;with chair  alarm set Nurse Communication: Mobility status PT Visit Diagnosis: Unsteadiness on feet (R26.81);Muscle weakness (generalized) (M62.81);Pain Pain - Right/Left: Left Pain - part of body: Hip    Time: 0034-9179 PT Time Calculation (min) (ACUTE ONLY): 24 min   Charges:   PT Evaluation $PT Eval Low Complexity: 1 Low          Festus Barren PT, DPT  Acute Rehabilitation Services  Office [phone]   07/21/2022, 1:50 PM

## 2022-07-21 NOTE — Plan of Care (Signed)
  Problem: Education: Goal: Ability to describe self-care measures that may prevent or decrease complications (Diabetes Survival Skills Education) will improve 07/21/2022 1053 by Olen Cordial, RN Outcome: Progressing 07/21/2022 1053 by Olen Cordial, RN Outcome: Progressing   Problem: Education: Goal: Knowledge of General Education information will improve Description: Including pain rating scale, medication(s)/side effects and non-pharmacologic comfort measures 07/21/2022 1053 by Olen Cordial, RN Outcome: Progressing 07/21/2022 1053 by Olen Cordial, RN Outcome: Progressing   Problem: Activity: Goal: Risk for activity intolerance will decrease 07/21/2022 1053 by Olen Cordial, RN Outcome: Progressing 07/21/2022 1053 by Olen Cordial, RN Outcome: Progressing

## 2022-07-21 NOTE — Progress Notes (Signed)
   07/21/22 1740  Assess: MEWS Score  Temp 98.5 F (36.9 C)  BP (!) 176/92  MAP (mmHg) 116  Pulse Rate (!) 118  Resp 18  SpO2 97 %  O2 Device Room Air  Assess: MEWS Score  MEWS Temp 0  MEWS Systolic 0  MEWS Pulse 2  MEWS RR 0  MEWS LOC 0  MEWS Score 2  MEWS Score Color Yellow  Assess: if the MEWS score is Yellow or Red  Were vital signs taken at a resting state? Yes  Focused Assessment No change from prior assessment  Does the patient meet 2 or more of the SIRS criteria? No  MEWS guidelines implemented *See Row Information* Yes  Treat  Pain Scale 0-10  Pain Score 0  Take Vital Signs  Increase Vital Sign Frequency  Yellow: Q 2hr X 2 then Q 4hr X 2, if remains yellow, continue Q 4hrs  Escalate  MEWS: Escalate Yellow: discuss with charge nurse/RN and consider discussing with provider and RRT  Notify: Charge Nurse/RN  Date Charge Nurse/RN Notified 07/21/22  Time Charge Nurse/RN Notified 1744  Notify: Provider  Provider Name/Title Chiu  Date Provider Notified 07/21/22  Time Provider Notified 1744  Method of Notification Page  Notification Reason Other (Comment) (yellow mews fro increased HR/BP)  Provider response Other (Comment) (awaiting if further orders)  Document  Progress note created (see row info) Yes  Assess: SIRS CRITERIA  SIRS Temperature  0  SIRS Pulse 1  SIRS Respirations  0  SIRS WBC 1  SIRS Score Sum  2

## 2022-07-22 DIAGNOSIS — S72002A Fracture of unspecified part of neck of left femur, initial encounter for closed fracture: Secondary | ICD-10-CM | POA: Diagnosis not present

## 2022-07-22 DIAGNOSIS — W19XXXA Unspecified fall, initial encounter: Secondary | ICD-10-CM | POA: Diagnosis not present

## 2022-07-22 LAB — COMPREHENSIVE METABOLIC PANEL
ALT: 10 U/L (ref 0–44)
AST: 13 U/L — ABNORMAL LOW (ref 15–41)
Albumin: 2.9 g/dL — ABNORMAL LOW (ref 3.5–5.0)
Alkaline Phosphatase: 68 U/L (ref 38–126)
Anion gap: 6 (ref 5–15)
BUN: 25 mg/dL — ABNORMAL HIGH (ref 8–23)
CO2: 28 mmol/L (ref 22–32)
Calcium: 8.2 mg/dL — ABNORMAL LOW (ref 8.9–10.3)
Chloride: 99 mmol/L (ref 98–111)
Creatinine, Ser: 0.71 mg/dL (ref 0.61–1.24)
GFR, Estimated: >60 mL/min (ref >60)
Glucose, Bld: 275 mg/dL — ABNORMAL HIGH (ref 70–99)
Potassium: 3.8 mmol/L (ref 3.5–5.1)
Sodium: 133 mmol/L — ABNORMAL LOW (ref 135–145)
Total Bilirubin: 0.7 mg/dL (ref 0.3–1.2)
Total Protein: 5.5 g/dL — ABNORMAL LOW (ref 6.5–8.1)

## 2022-07-22 LAB — CBC
HCT: 28.5 % — ABNORMAL LOW (ref 39.0–52.0)
Hemoglobin: 9.3 g/dL — ABNORMAL LOW (ref 13.0–17.0)
MCH: 30.9 pg (ref 26.0–34.0)
MCHC: 32.6 g/dL (ref 30.0–36.0)
MCV: 94.7 fL (ref 80.0–100.0)
Platelets: 148 10*3/uL — ABNORMAL LOW (ref 150–400)
RBC: 3.01 MIL/uL — ABNORMAL LOW (ref 4.22–5.81)
RDW: 14.3 % (ref 11.5–15.5)
WBC: 6.7 10*3/uL (ref 4.0–10.5)
nRBC: 0 % (ref 0.0–0.2)

## 2022-07-22 LAB — GLUCOSE, CAPILLARY
Glucose-Capillary: 291 mg/dL — ABNORMAL HIGH (ref 70–99)
Glucose-Capillary: 368 mg/dL — ABNORMAL HIGH (ref 70–99)
Glucose-Capillary: 238 mg/dL — ABNORMAL HIGH (ref 70–99)
Glucose-Capillary: 242 mg/dL — ABNORMAL HIGH (ref 70–99)

## 2022-07-22 NOTE — Progress Notes (Signed)
   Subjective: 2 Days Post-Op Procedure(s) (LRB): TOTAL HIP ARTHROPLASTY ANTERIOR APPROACH (Left)  Pt doing well this morning Therapy went well yesterday Denies any new symptoms or issues Patient reports pain as mild.  Objective:   VITALS:   Vitals:   07/22/22 0157 07/22/22 0442  BP: (!) 154/76 138/82  Pulse: (!) 112   Resp: 18 18  Temp: 99.5 F (37.5 C) 99.2 F (37.3 C)  SpO2: 94% 98%    Left  hip incision healing well Nv intact distally No rashes or edema distally Good early rom  LABS Recent Labs    07/20/22 0349 07/21/22 0356 07/22/22 0325  HGB 12.5* 9.9* 9.3*  HCT 37.9* 30.3* 28.5*  WBC 9.6 7.3 6.7  PLT 209 150 148*    Recent Labs    07/19/22 1146 07/21/22 0356 07/22/22 0325  NA 139 136 133*  K 3.9 4.1 3.8  BUN 14 26* 25*  CREATININE 0.94 0.93 0.71  GLUCOSE 237* 291* 275*     Assessment/Plan: 2 Days Post-Op Procedure(s) (LRB): TOTAL HIP ARTHROPLASTY ANTERIOR APPROACH (Left) Continue PT/OT D/c planning Pain management Overall doing well     Kathrynn Speed, MPAS [facility] now [employer] [facility] [address], [place], [place],  [address] Phone: [phone] [email or web address] Facebook  [employer]

## 2022-07-22 NOTE — NC FL2 (Signed)
[place] LEVEL OF CARE SCREENING TOOL     IDENTIFICATION  Patient Name: Micheal Moore. Birthdate: 03-28-1951 Sex: male Admission Date (Current Location): 07/19/2022  [facility] and [place] Number:  Herbalist and Address:  [facility],  [place], [place]      Provider Number: [id number]  Attending Physician Name and Address:  Donne Hazel, MD  Relative Name and Phone Number:   [name] Denicola(spouse)[phone])    Current Level of Care: Hospital Recommended Level of Care: [place] Prior Approval Number:    Date Approved/Denied:   PASRR Number:  [id number])  Discharge Plan: SNF    Current Diagnoses: Patient Active Problem List   Diagnosis Date Noted   Malnutrition of moderate degree 07/20/2022   Fall    Orthostatic hypotension    Hip fracture (HCC) 02/22/2022   Hyperlipidemia 02/22/2022   Chronic diastolic CHF (congestive heart failure) ([place]) 02/22/2022   Left great toe amputee ([place]) 05/06/2020   Cellulitis in diabetic foot ([place]) 04/27/2020   Osteomyelitis of great toe of left foot ([place]) 04/27/2020   Type 2 diabetes mellitus with hyperlipidemia ([place]) 04/27/2020   History of DVT (deep vein thrombosis) 04/27/2020   Tobacco abuse 04/27/2020    Orientation RESPIRATION BLADDER Height & Weight     Self, Time, Situation, Place  Normal Continent Weight: 88.5 kg Height:  '6\' 5"'$  (195.6 cm)  BEHAVIORAL SYMPTOMS/MOOD NEUROLOGICAL BOWEL NUTRITION STATUS      Continent Diet (CHO MOD)  AMBULATORY STATUS COMMUNICATION OF NEEDS Skin   Limited Assist Verbally Surgical wounds (L THA; L hip surgical incision.)                       Personal Care Assistance Level of Assistance  Bathing, Feeding, Dressing Bathing Assistance: Limited assistance Feeding assistance: Limited assistance Dressing Assistance: Limited assistance     Functional Limitations Info  Sight, Hearing, Speech Sight  Info: Impaired (eyeglasses) Hearing Info: Adequate Speech Info: Adequate    SPECIAL CARE FACTORS FREQUENCY  PT (By licensed PT), OT (By licensed OT)     PT Frequency:  (5x week) OT Frequency:  (5x week)            Contractures Contractures Info: Not present    Additional Factors Info  Code Status, Allergies, Insulin Sliding Scale Code Status Info:  (Full) Allergies Info:  (NKA)   Insulin Sliding Scale Info:  (SSI)       Current Medications (07/22/2022):  This is the current hospital active medication list Current Facility-Administered Medications  Medication Dose Route Frequency Provider Last Rate Last Admin   0.9 %  sodium chloride infusion   Intravenous Continuous Rod Can, MD 20 mL/hr at 07/20/22 2148 Rate Change at 07/20/22 2148   alum & mag hydroxide-simeth (MAALOX/MYLANTA) 200-200-20 MG/5ML suspension 30 mL  30 mL Oral Q4H PRN Swinteck, Aaron Edelman, MD       apixaban Arne Cleveland) tablet 2.5 mg  2.5 mg Oral Q12H Swinteck, Aaron Edelman, MD   2.5 mg at 07/22/22 2119   diphenhydrAMINE (BENADRYL) 12.5 MG/5ML elixir 12.5-25 mg  12.5-25 mg Oral Q4H PRN Swinteck, Aaron Edelman, MD       docusate sodium (COLACE) capsule 100 mg  100 mg Oral BID Rod Can, MD   100 mg at 07/22/22 0951   fludrocortisone (FLORINEF) tablet 0.2 mg  0.2 mg Oral Daily Rod Can, MD   0.2 mg at 07/22/22 0951   HYDROcodone-acetaminophen (NORCO/VICODIN) 5-325  MG per tablet 1-2 tablet  1-2 tablet Oral Q6H PRN Rod Can, MD   2 tablet at 07/22/22 0610   HYDROmorphone (DILAUDID) injection 0.5 mg  0.5 mg Intravenous Q2H PRN Rod Can, MD   0.5 mg at 07/19/22 1848   insulin aspart (novoLOG) injection 0-15 Units  0-15 Units Subcutaneous TID WC Rod Can, MD   15 Units at 07/22/22 1159   labetalol (NORMODYNE) injection 5 mg  5 mg Intravenous Q2H PRN Donne Hazel, MD       linagliptin (TRADJENTA) tablet 5 mg  5 mg Oral Daily Swinteck, Aaron Edelman, MD   5 mg at 07/22/22 1010   menthol-cetylpyridinium  (CEPACOL) lozenge 3 mg  1 lozenge Oral PRN Swinteck, Aaron Edelman, MD       Or   phenol (CHLORASEPTIC) mouth spray 1 spray  1 spray Mouth/Throat PRN Swinteck, Aaron Edelman, MD       methocarbamol (ROBAXIN) tablet 500 mg  500 mg Oral Q6H PRN Rod Can, MD   500 mg at 07/22/22 1660   Or   methocarbamol (ROBAXIN) 500 mg in dextrose 5 % 50 mL IVPB  500 mg Intravenous Q6H PRN Swinteck, Aaron Edelman, MD       metoCLOPramide (REGLAN) tablet 5-10 mg  5-10 mg Oral Q8H PRN Swinteck, Aaron Edelman, MD       Or   metoCLOPramide (REGLAN) injection 5-10 mg  5-10 mg Intravenous Q8H PRN Swinteck, Aaron Edelman, MD       metoprolol tartrate (LOPRESSOR) tablet 12.5 mg  12.5 mg Oral BID Donne Hazel, MD   12.5 mg at 07/22/22 6301   multivitamin with minerals tablet 1 tablet  1 tablet Oral Daily Swinteck, Aaron Edelman, MD   1 tablet at 07/22/22 0951   ondansetron (ZOFRAN) tablet 4 mg  4 mg Oral Q6H PRN Swinteck, Aaron Edelman, MD       Or   ondansetron (ZOFRAN) injection 4 mg  4 mg Intravenous Q6H PRN Swinteck, Aaron Edelman, MD       polyethylene glycol (MIRALAX / GLYCOLAX) packet 17 g  17 g Oral Daily Swinteck, Aaron Edelman, MD   17 g at 07/22/22 6010   polyethylene glycol (MIRALAX / GLYCOLAX) packet 17 g  17 g Oral Daily PRN Swinteck, Aaron Edelman, MD       protein supplement (ENSURE MAX) liquid  11 oz Oral BID Rod Can, MD   11 oz at 07/22/22 1010   rosuvastatin (CRESTOR) tablet 5 mg  5 mg Oral Once per day on Tue Sat Rod Can, MD   5 mg at 07/21/22 9323   senna (SENOKOT) tablet 8.6 mg  1 tablet Oral BID Rod Can, MD   8.6 mg at 07/22/22 5573     Discharge Medications: Please see discharge summary for a list of discharge medications.  Relevant Imaging Results:  Relevant Lab Results:   Additional Information  ([id number])  Bridger Pizzi, Juliann Pulse, RN

## 2022-07-22 NOTE — Progress Notes (Signed)
Physical Therapy Treatment Patient Details Name: Micheal Moore. MRN: [id number] DOB: [birth year] Today's Date: 07/22/2022   History of Present Illness Micheal Moore. is a 71 y.o. male with medical history significant of IIDM, orthostatic hypotension on midodrine, HTN, HLD, chronic HFpEF, DVT history of presented with fall and left hip pain. Patient s/p ORIF left femoral neck fracture.    PT Comments    Pt very cooperative but progressing slowly with mobility secondary to c/o pain and fatigue with activity.  Pt denies dizziness this date with BP as follows: supine 146/72; sit 148/77; stand 102/76 and after ambulating short distance 99/61.  Abdominal binder in place for OOB activity.   Recommendations for follow up therapy are one component of a multi-disciplinary discharge planning process, led by the attending physician.  Recommendations may be updated based on patient status, additional functional criteria and insurance authorization.  Follow Up Recommendations  Skilled nursing-short term rehab (<3 hours/day) Can patient physically be transported by private vehicle: Yes   Assistance Recommended at Discharge Frequent or constant Supervision/Assistance  Patient can return home with the following A little help with walking and/or transfers;A little help with bathing/dressing/bathroom;Assist for transportation;Help with stairs or ramp for entrance   Equipment Recommendations  None recommended by PT    Recommendations for Other Services       Precautions / Restrictions Precautions Precautions: Fall Precaution Comments: monitor BP - orthostatic at baseline Restrictions Weight Bearing Restrictions: No Other Position/Activity Restrictions: WBAT L LE     Mobility  Bed Mobility Overal bed mobility: Needs Assistance Bed Mobility: Supine to Sit     Supine to sit: Min assist, HOB elevated     General bed mobility comments: Increased time with assist for  progression of L LE to EOB and for trunk to upright    Transfers Overall transfer level: Needs assistance Equipment used: Rolling walker (2 wheels) Transfers: Sit to/from Stand Sit to Stand: Mod assist           General transfer comment: cues for hand placement; physical assist to bring wt up and fwd and to balance in standing with RW    Ambulation/Gait Ambulation/Gait assistance: Min assist, +2 safety/equipment (chair follow) Gait Distance (Feet): 19 Feet Assistive device: Rolling walker (2 wheels) Gait Pattern/deviations: Step-to pattern, Decreased stride length, Decreased weight shift to left, Knee flexed in stance - left, Trunk flexed Gait velocity: decr     General Gait Details: cues for increased knee extension on L LE in stance, and upright posture- pt reports history of cervical pain. Cues for step to gait pattern.   Stairs             Wheelchair Mobility    Modified Rankin (Stroke Patients Only)       Balance Overall balance assessment: Needs assistance Sitting-balance support: No upper extremity supported, Feet supported Sitting balance-[name]: Fair     Standing balance support: During functional activity, Reliant on assistive device for balance Standing balance-[name]: Poor                              Cognition Arousal/Alertness: Awake/alert Behavior During Therapy: WFL for tasks assessed/performed Overall Cognitive Status: Within Functional Limits for tasks assessed  Exercises Total Joint Exercises Ankle Circles/Pumps: AROM, Both, 15 reps, Supine Quad Sets: AROM, Both, 10 reps, Supine Heel Slides: AAROM, Left, 15 reps, Supine Hip ABduction/ADduction: AAROM, Left, 15 reps, Supine    General Comments        Pertinent Vitals/Pain Pain Assessment Pain Assessment: Faces Faces Pain Scale: Hurts even more Pain Location: L hip Pain Descriptors / Indicators:  Grimacing, Sore, Discomfort Pain Intervention(s): Limited activity within patient's tolerance, Monitored during session, Premedicated before session    Home Living                          Prior Function            PT Goals (current goals can now be found in the care plan section) Acute Rehab PT Goals Patient Stated Goal: less pain PT Goal Formulation: With patient Time For Goal Achievement: 08/04/22 Potential to Achieve Goals: Good Progress towards PT goals: Progressing toward goals    Frequency    Min 3X/week      PT Plan Current plan remains appropriate    Co-evaluation              AM-PAC PT "6 Clicks" Mobility   Outcome Measure  Help needed turning from your back to your side while in a flat bed without using bedrails?: A Little Help needed moving from lying on your back to sitting on the side of a flat bed without using bedrails?: A Lot Help needed moving to and from a bed to a chair (including a wheelchair)?: A Lot Help needed standing up from a chair using your arms (e.g., wheelchair or bedside chair)?: A Lot Help needed to walk in hospital room?: A Little Help needed climbing 3-5 steps with a railing? : Total 6 Click Score: 13    End of Session Equipment Utilized During Treatment: Gait belt;Other (comment) (abdominal binder) Activity Tolerance: Patient tolerated treatment well;Patient limited by fatigue Patient left: in chair;with call bell/phone within reach;with nursing/sitter in room Nurse Communication: Mobility status PT Visit Diagnosis: Unsteadiness on feet (R26.81);Muscle weakness (generalized) (M62.81);Pain Pain - Right/Left: Left Pain - part of body: Hip     Time: 0910-0940 PT Time Calculation (min) (ACUTE ONLY): 30 min  Charges:  $Gait Training: 8-22 mins $Therapeutic Exercise: 8-22 mins                     Debe Coder PT Acute Rehabilitation Services Pager [phone] Office  [phone]    Kristol Almanzar 07/22/2022, 10:34 AM

## 2022-07-22 NOTE — Progress Notes (Signed)
  Progress Note   Patient: Micheal Moore. IAX:[id number] DOB: 05-Jul-1951 DOA: 07/19/2022     3 DOS: the patient was seen and examined on 07/22/2022   Brief hospital course: 71 y.o. male with medical history significant of IIDM, orthostatic hypotension on midodrine, HTN, HLD, chronic HFpEF, DVT history of presented with fall and left hip pain.   Patient was walking his dog this morning in the yard, and fell down forward while turning around to tend his dog. No LOC, no head injury, he denies any prodromes of lightheadedness blurry vision nauseous shortness of breath.  He has chronic orthostatic hypotension on midodrine and fludrocortisone, he reported occasionally feeling lightheadedness while rising up but denied any such episode happened today.  He had a similar event in June this year when he fractured right femoral neck underwent ORIF.  Assessment and Plan: Left femoral neck fracture, closed -Pt underwent L total hip arthoplasty 11/3 per Ortho -Cont with analgesia as needed -PT recs for SNF. TOC following   Orthostatic hypotension -Appears to be stable, had been on fludrocortisone -Appears to be euvolemic on exam -midodrine was d/c as pt was hypertensive -Will continue with compression hose and abd binder   IIDM with hyperglycemia -Hold off sulfonylurea -continue sliding scale while in hospital   HLD -On Statin  Likely osteoporosis -Pt describes now multiple fractures involving seemingly minor injuries -consider bone density and starting bisphosphonate as outpatient      Subjective: No complaints this AM  Physical Exam: Vitals:   07/22/22 0157 07/22/22 0442 07/22/22 0958 07/22/22 1408  BP: (!) 154/76 138/82 101/87 (!) 148/89  Pulse: (!) 112 (!) 102 (!) 108 (!) 101  Resp: '18 18 18 18  '$ Temp: 99.5 F (37.5 C) 99.2 F (37.3 C) 98.6 F (37 C) 98.8 F (37.1 C)  TempSrc: Oral Oral Oral Oral  SpO2: 94% 98%  100%  Weight:      Height:       General exam: Awake,  laying in bed, in nad Respiratory system: Normal respiratory effort, no wheezing Cardiovascular system: regular rate, s1, s2 Gastrointestinal system: Soft, nondistended, positive BS Central nervous system: CN2-12 grossly intact, strength intact Extremities: Perfused, no clubbing Skin: Normal skin turgor, no notable skin lesions seen Psychiatry: Mood normal // no visual hallucinations   Data Reviewed:  Labs reviewed: WBC 6.7, 9.3, Plts 148   Family Communication: Pt in room, family not at bedside  Disposition: Status is: Inpatient Remains inpatient appropriate because: Severity of illness  Planned Discharge Destination: Skilled nursing facility    Author: Marylu Lund, MD 07/22/2022 3:49 PM  For on call review [email or web address].

## 2022-07-22 NOTE — TOC Progression Note (Addendum)
Transition of Care (TOC) - Progression Note    Patient Details  Name: Nasean Zapf. MRN: [id number] Date of Birth: 1951-08-28  Transition of Care [facility]) CM/SW Contact  [place] Goh, Juliann Pulse, RN Phone Number: 07/22/2022, 2:23 PM  Clinical Narrative: Patient agreed to [facility] SNF. Faxed out await bed offers, choice,prior auth.     Expected Discharge Plan: Skilled Nursing Facility Barriers to Discharge: Continued Medical Work up  Expected Discharge Plan and Services Expected Discharge Plan: [place]                                               Social Determinants of Health (SDOH) Interventions    Readmission Risk Interventions     No data to display

## 2022-07-22 NOTE — TOC Progression Note (Deleted)
Transition of Care (TOC) - Progression Note    Patient Details  Name: Micheal Moore. MRN: [id number] Date of Birth: [birth year]  Transition of Care [facility]) CM/SW Contact  Lorri Fukuhara, Juliann Pulse, RN Phone Number: 07/22/2022, 3:17 PM  Clinical Narrative:  Patient in agreement to ST SNF,also spoke to sister [name] per patient request-await bed choice.   1. 1.5 mi [facility] for Nursing and Rehab [place], [place] [address] [phone] Overall rating Much below average 2. 1.9 mi [facility] [place] [address] [place], [place] [address] [phone] Overall rating Average 3. 2 mi [facility] for Nursing and Rehabilitation [address] [place], [place] [address] [phone] Overall rating Much below average 4. 2.4 mi [facility] at the [place], [place] [address] [phone] Overall rating Above average 5. 2.6 mi [facility], [place] [address] [phone] Overall rating Much below average 6. 3.4 mi [place], [place] [address] [phone] Overall rating Much below average 7. 3.7 mi [facility] [place], [place] [address] [phone] Overall rating Much above average 8. 3.9 mi [facility] [place], [place] [address] [phone] Overall rating Above average 9. [place], [place] [address] [phone] Overall rating Below average 10. 4.2 mi [place] [address], [place] [address] [phone] Overall rating Below average 11. 4.6 mi [facility] [address] [place], [place] [address] [phone] Overall  rating Much below average 12. 6.3 mi [facility] [address] [place], [place] [address] [phone] Overall rating Above average 13. 9.2 mi [place], [place] [address] [phone] Overall rating Below average 14. 9.6 [place], [place] [address] [phone] Overall rating Much above average 15. 9.8 mi [facility] [address] [place], [place] [address] [phone] Overall rating Above average 16. 9.9 mi [facility] [address] [place], [place] [address] [phone] Overall rating Much above average 17. 10.7 mi [place] at [facility] [address] [place], [place] [address] [phone] Overall rating Much above average 18. 13.3 [facility] [address] [place], [place] [address] [phone] Overall rating Much below average 19. 13.6 mi [facility] [address] [place], [place] [address] [phone] Overall rating Much below average 20. 14.2 mi [facility] and [facility] [place], [place] [address] [phone] Overall rating Much above average 21. 14.4 [place] [address] [place], [place] [address] [phone] Overall rating Much below average 22. 15 mi [facility] [place], [place] [address] [phone] Overall rating Above average 23. 15.2 mi [place] [address] [place], [place] [address] [phone] Overall rating Average 24. 15.3 mi [facility] [address] [place], [place] [address] [phone] Overall rating Average 25. 16.9 mi [facility] [place],  [address] [phone] Overall rating Above  average 26. 18.1 mi [place], [place] [address] [phone] Overall rating Average 27. 18.9 mi [facility] for Nursing and Rehab [address] [place], [place] [address] [phone] Overall rating Much below average 28. 19.7 mi [facility] and [facility] [place], [place] [address] [phone] Overall rating Below average 29. 20.2 mi [address] at the [facility] at [place], [place] [address] [phone] Overall rating Much above average 30. 20.4 mi [facility] [address] [place], [place] [address] [phone] Overall rating Much below average 31. 20.4 mi [facility] [address] [place], [place] [address] [phone] Overall rating Much below average 32. 20.6 [place], [place] [address] [phone] Overall rating Much above average 33. 21.2 [address] [place], [place] [address] [phone] Overall rating Average 34. 22.7 mi [facility] [address] [place], [place] [address] [phone] Overall rating Below average 35. 22.8 mi [facility] [address] [place], [place] [address] [phone] Overall rating Much above average 36. 23.1 mi [place] [address] [place], [place] [address] [phone] Overall rating Average 37. 23.5 mi [facility] [address] [place], [place] [address] [phone] Overall rating Above average 38. 23.7 [place], [place] [address] [phone] Overall rating Not available18 39. [place], [place] [address] [phone] Overall rating Much  below average 40. 24.8 mi [place] [address] [place],  [address] [phone] Overall rating Above average To explore an  Expected Discharge Plan: [place] Barriers to Discharge: Continued Medical Work up  Expected Discharge Plan and Services Expected Discharge Plan: [place]                                               Social Determinants of Health (SDOH) Interventions    Readmission Risk Interventions     No data to display

## 2022-07-22 NOTE — Plan of Care (Signed)
  Problem: Education: Goal: Ability to describe self-care measures that may prevent or decrease complications (Diabetes Survival Skills Education) will improve Outcome: Progressing   Problem: Activity: Goal: Risk for activity intolerance will decrease Outcome: Progressing   Problem: Pain Managment: Goal: General experience of comfort will improve Outcome: Progressing

## 2022-07-23 ENCOUNTER — Encounter (HOSPITAL_COMMUNITY): Payer: Self-pay | Admitting: Orthopedic Surgery

## 2022-07-23 DIAGNOSIS — S72002A Fracture of unspecified part of neck of left femur, initial encounter for closed fracture: Secondary | ICD-10-CM | POA: Diagnosis not present

## 2022-07-23 DIAGNOSIS — W19XXXA Unspecified fall, initial encounter: Secondary | ICD-10-CM | POA: Diagnosis not present

## 2022-07-23 LAB — GLUCOSE, CAPILLARY
Glucose-Capillary: 275 mg/dL — ABNORMAL HIGH (ref 70–99)
Glucose-Capillary: 216 mg/dL — ABNORMAL HIGH (ref 70–99)
Glucose-Capillary: 292 mg/dL — ABNORMAL HIGH (ref 70–99)
Glucose-Capillary: 245 mg/dL — ABNORMAL HIGH (ref 70–99)

## 2022-07-23 MED ORDER — HYDROCODONE-ACETAMINOPHEN 5-325 MG PO TABS: 1.0000 | ORAL_TABLET | ORAL | 0 refills | Status: AC | PRN

## 2022-07-23 MED ORDER — INSULIN GLARGINE-YFGN 100 UNIT/ML ~~LOC~~ SOLN
9.0000 [IU] | Freq: Every day | SUBCUTANEOUS | Status: DC
  Administered 2022-07-23: 9 [IU] via SUBCUTANEOUS
  Filled 2022-07-23 (×2): qty 0.09

## 2022-07-23 MED ORDER — APIXABAN 2.5 MG PO TABS: 2.5000 mg | ORAL_TABLET | Freq: Two times a day (BID) | ORAL | 0 refills | Status: DC

## 2022-07-23 NOTE — Progress Notes (Signed)
    Subjective:  Patient reports pain as mild to moderate.  Denies N/V/CP/SOB/Abd pain. He states he has some soreness in this thigh with movement otherwise it doesn't hurt. Patient denies tingling and numbness in LE bilaterally.   Objective:   VITALS:   Vitals:   07/22/22 0958 07/22/22 1408 07/22/22 1948 07/23/22 0540  BP: 101/87 (!) 148/89 (!) 165/82 (!) 157/80  Pulse: (!) 108 (!) 101 (!) 108 100  Resp: _0 Temp: 98.6 F (37 C) 98.8 F (37.1 C) 99 F (37.2 C) 98.7 F (37.1 C)  TempSrc: Oral Oral Oral Oral  SpO2:  100% 99% 96%  Weight:      Height:        Patient sitting up in bed. NAD.  Neurologically intact ABD soft Neurovascular intact Sensation intact distally Intact pulses distally Dorsiflexion/Plantar flexion intact Incision: dressing C/D/I No cellulitis present Compartment soft Pain over distal IT band with tightness.   Lab Results  Component Value Date   WBC 6.7 07/22/2022   HGB 9.3 (L) 07/22/2022   HCT 28.5 (L) 07/22/2022   MCV 94.7 07/22/2022   PLT 148 (L) 07/22/2022   BMET    Component Value Date/Time   NA 133 (L) 07/22/2022 0325   NA 137 12/04/2021 1429   K 3.8 07/22/2022 0325   CL 99 07/22/2022 0325   CO2 28 07/22/2022 0325   GLUCOSE 275 (H) 07/22/2022 0325   BUN 25 (H) 07/22/2022 0325   BUN 17 12/04/2021 1429   CREATININE 0.71 07/22/2022 0325   CALCIUM 8.2 (L) 07/22/2022 0325   EGFR 79 12/04/2021 1429   GFRNONAA >60 07/22/2022 0325     Assessment/Plan: 3 Days Post-Op   Principal Problem:   Hip fracture (HCC) Active Problems:   Malnutrition of moderate degree   WBAT with walker DVT ppx:  Eliquis , SCDs, TEDS PO pain control PT/OT: Patient ambulated 19 feet with PT yesterday. He did have some BP changes but patient did not note dizziness during session. Continue PT.  Dispo: Patient under care of the medical team, disposition per their recommendation. Discussion about SNF. Pain medication printed in chart. Eliquis for  DVT ppx.    Charlott Rakes, PA-C 07/23/2022, 7:53 AM    [facility]  [place] [address], [address], [place], [place] [address] Phone: [phone] [email or web address] Facebook  [employer]

## 2022-07-23 NOTE — Progress Notes (Signed)
Physical Therapy Treatment Patient Details Name: Micheal Moore. MRN: [id number] DOB: 07-28-1951 Today's Date: 07/23/2022   History of Present Illness Micheal Moore. is a 71 y.o. male with medical history significant of IIDM, orthostatic hypotension on midodrine, HTN, HLD, chronic HFpEF, DVT history of presented with fall and left hip pain. Patient s/p ORIF left femoral neck fracture.    PT Comments    POD # 3 General Comments: AxO x 3 pleasant requiring repeat instructions/easily distracted.  Stated his Dog Mayo "tripped me" and he fell on his left side.  "Same thing happened a couple of months ago" when he broke his other hip. Assisted OOB was difficult and required increased time.  General bed mobility comments: Increased time with assist for progression of L LE to EOB and for trunk to upright.  Educated on use of belt to asisst L LE. General transfer comment: cues for hand placement; physical assist to bring wt up and fwd and to balance in standing with RW.  General Gait Details: cues for increased knee extension on L LE in stance, and upright posture- pt reports history of cervical pain. Cues for step to gait pattern. Distance limited by increased pain with activity. Pt will need ST Rehab at SNF to address mobility and functional decline prior to safely returning home. Vitals Supine      BP 146/72  HR 101 EOB          BP 157/71  HR 106 Standing   BP 156/73  HR 109  Recommendations for follow up therapy are one component of a multi-disciplinary discharge planning process, led by the attending physician.  Recommendations may be updated based on patient status, additional functional criteria and insurance authorization.  Follow Up Recommendations  Skilled nursing-short term rehab (<3 hours/day)     Assistance Recommended at Discharge Frequent or constant Supervision/Assistance  Patient can return home with the following A little help with walking and/or transfers;A  little help with bathing/dressing/bathroom;Assist for transportation;Help with stairs or ramp for entrance   Equipment Recommendations  None recommended by PT    Recommendations for Other Services       Precautions / Restrictions Precautions Precautions: Fall Precaution Comments: (applied TEDS + ABD Binder) Restrictions Weight Bearing Restrictions: No Other Position/Activity Restrictions: WBAT L LE     Mobility  Bed Mobility Overal bed mobility: Needs Assistance Bed Mobility: Supine to Sit     Supine to sit: Mod assist     General bed mobility comments: Increased time with assist for progression of L LE to EOB and for trunk to upright.  Educated on use of belt to asisst L LE.    Transfers Overall transfer level: Needs assistance Equipment used: Rolling walker (2 wheels) Transfers: Sit to/from Stand Sit to Stand: Mod assist           General transfer comment: cues for hand placement; physical assist to bring wt up and fwd and to balance in standing with RW    Ambulation/Gait Ambulation/Gait assistance: Min assist, +2 safety/equipment Gait Distance (Feet): 8 Feet Assistive device: Rolling walker (2 wheels) Gait Pattern/deviations: Step-to pattern, Decreased stride length, Decreased weight shift to left, Knee flexed in stance - left, Trunk flexed Gait velocity: decreased     General Gait Details: cues for increased knee extension on L LE in stance, and upright posture- pt reports history of cervical pain. Cues for step to gait pattern. Distance limited by increased pain with activity.   Stairs  Wheelchair Mobility    Modified Rankin (Stroke Patients Only)       Balance                                            Cognition Arousal/Alertness: Awake/alert Behavior During Therapy: WFL for tasks assessed/performed Overall Cognitive Status: Within Functional Limits for tasks assessed                                  General Comments: AxO x 3 pleasant requiring repeat instructions/easily distracted        Exercises      General Comments        Pertinent Vitals/Pain Pain Assessment Pain Assessment: 0-10 Pain Score: 7  Pain Location: L hip Pain Descriptors / Indicators: Grimacing, Sore, Discomfort Pain Intervention(s): Monitored during session, Premedicated before session, Ice applied    Home Living                          Prior Function            PT Goals (current goals can now be found in the care plan section) Progress towards PT goals: Progressing toward goals    Frequency    Min 3X/week      PT Plan Current plan remains appropriate    Co-evaluation              AM-PAC PT "6 Clicks" Mobility   Outcome Measure  Help needed turning from your back to your side while in a flat bed without using bedrails?: A Little Help needed moving from lying on your back to sitting on the side of a flat bed without using bedrails?: A Little Help needed moving to and from a bed to a chair (including a wheelchair)?: A Little Help needed standing up from a chair using your arms (e.g., wheelchair or bedside chair)?: A Lot Help needed to walk in hospital room?: A Lot Help needed climbing 3-5 steps with a railing? : Total 6 Click Score: 14    End of Session Equipment Utilized During Treatment: Gait belt Activity Tolerance: Patient tolerated treatment well;Patient limited by fatigue Patient left: in chair;with call bell/phone within reach;with chair alarm set Nurse Communication: Mobility status PT Visit Diagnosis: Unsteadiness on feet (R26.81);Muscle weakness (generalized) (M62.81);Pain Pain - Right/Left: Left Pain - part of body: Hip     Time: 8003-4917 PT Time Calculation (min) (ACUTE ONLY): 24 min  Charges:  $Gait Training: 8-22 mins $Therapeutic Activity: 8-22 mins                     {Jodi Kappes  PTA Acute  [employer] M-F           [phone] Weekend pager [phone]

## 2022-07-23 NOTE — Progress Notes (Signed)
  Progress Note   Patient: Micheal Moore. ZSW:[id number] DOB: 1951/06/06 DOA: 07/19/2022     4 DOS: the patient was seen and examined on 07/23/2022   Brief hospital course: 71 y.o. male with medical history significant of IIDM, orthostatic hypotension on midodrine, HTN, HLD, chronic HFpEF, DVT history of presented with fall and left hip pain.   Patient was walking his dog this morning in the yard, and fell down forward while turning around to tend his dog. No LOC, no head injury, he denies any prodromes of lightheadedness blurry vision nauseous shortness of breath.  He has chronic orthostatic hypotension on midodrine and fludrocortisone, he reported occasionally feeling lightheadedness while rising up but denied any such episode happened today.  He had a similar event in June this year when he fractured right femoral neck underwent ORIF.  Assessment and Plan: Left femoral neck fracture, closed -Pt underwent L total hip arthoplasty 11/3 per Ortho -Cont with analgesia as needed -PT recs for SNF. TOC cont to follow   Orthostatic hypotension -Appears to be stable, had been on fludrocortisone -Appears to be euvolemic on exam -midodrine was d/c as pt was hypertensive -Will continue with Leg compression hose and abd binder when up   IIDM with hyperglycemia -Hold off sulfonylurea -continue sliding scale while in hospital -glucose suboptimally controlled. Will add 9 units semglee per Diabetic Coordinator rec   HLD -On Statin  Likely osteoporosis -Pt describes now multiple fractures involving seemingly minor injuries -consider bone density and starting bisphosphonate as outpatient      Subjective: Without complaints this AM  Physical Exam: Vitals:   07/22/22 1408 07/22/22 1948 07/23/22 0540 07/23/22 1325  BP: (!) 148/89 (!) 165/82 (!) 157/80 (!) 146/72  Pulse: (!) 101 (!) 108 100 (!) 103  Resp: '18 18 18 18  '$ Temp: 98.8 F (37.1 C) 99 F (37.2 C) 98.7 F (37.1 C) 98.8 F  (37.1 C)  TempSrc: Oral Oral Oral   SpO2: 100% 99% 96% 99%  Weight:      Height:       General exam: Conversant, in no acute distress Respiratory system: normal chest rise, clear, no audible wheezing Cardiovascular system: regular rhythm, s1-s2 Gastrointestinal system: Nondistended, nontender, pos BS Central nervous system: No seizures, no tremors Extremities: No cyanosis, no joint deformities Skin: No rashes, no pallor Psychiatry: Affect normal // no auditory hallucinations   Data Reviewed:  There are no new results to review at this time.  Family Communication: Pt in room, family not at bedside  Disposition: Status is: Inpatient Remains inpatient appropriate because: Severity of illness  Planned Discharge Destination: Skilled nursing facility    Author: Marylu Lund, MD 07/23/2022 4:46 PM  For on call review [email or web address].

## 2022-07-23 NOTE — Inpatient Diabetes Management (Signed)
Inpatient Diabetes Program Recommendations  AACE/ADA: New Consensus Statement on Inpatient Glycemic Control (2015)  Target Ranges:  Prepandial:   less than 140 mg/dL      Peak postprandial:   less than 180 mg/dL (1-2 hours)      Critically ill patients:  140 - 180 mg/dL   Lab Results  Component Value Date   GLUCAP 216 (H) 07/23/2022   HGBA1C 7.2 (H) 02/22/2022    Review of Glycemic Control  Diabetes history: DM2 Outpatient Diabetes medications: Jardiance 10 mg daily (has been out of med for few days), Amaryl 2 mg daily, Januvia 50 mg daily (not taking), Metformin 1000 mg BID (not taking)  Current orders for Inpatient glycemic control: Tradjenta 5 mg daily, Novolog 0-15 units TID with meals   Inpatient Diabetes Program Recommendations:    If glucose remains consistently over 180 mg/dl, please consider ordering Semglee 9 units Q24H (based on 88.5 kg x 0.1 units)  Continue to follow.  Thank you. Lorenda Peck, RD, LDN, [place] Inpatient Diabetes Coordinator [phone]

## 2022-07-23 NOTE — Plan of Care (Signed)
  Problem: Coping: Goal: Level of anxiety will decrease Outcome: Progressing   Problem: Safety: Goal: Ability to remain free from injury will improve Outcome: Progressing   Problem: Pain Managment: Goal: General experience of comfort will improve Outcome: Progressing

## 2022-07-23 NOTE — Care Management Important Message (Signed)
Important Message  Patient Details IM Letter given Name: Micheal Moore. MRN: [id number] Date of Birth: 05-13-1951   Medicare Important Message Given:  Yes     Kerin Salen 07/23/2022, 3:03 PM

## 2022-07-24 DIAGNOSIS — S72002A Fracture of unspecified part of neck of left femur, initial encounter for closed fracture: Secondary | ICD-10-CM | POA: Diagnosis not present

## 2022-07-24 DIAGNOSIS — W19XXXA Unspecified fall, initial encounter: Secondary | ICD-10-CM | POA: Diagnosis not present

## 2022-07-24 LAB — GLUCOSE, CAPILLARY
Glucose-Capillary: 274 mg/dL — ABNORMAL HIGH (ref 70–99)
Glucose-Capillary: 308 mg/dL — ABNORMAL HIGH (ref 70–99)
Glucose-Capillary: 252 mg/dL — ABNORMAL HIGH (ref 70–99)
Glucose-Capillary: 241 mg/dL — ABNORMAL HIGH (ref 70–99)

## 2022-07-24 MED ORDER — INSULIN GLARGINE-YFGN 100 UNIT/ML ~~LOC~~ SOLN
15.0000 [IU] | Freq: Every day | SUBCUTANEOUS | Status: DC
  Administered 2022-07-24: 15 [IU] via SUBCUTANEOUS
  Filled 2022-07-24: qty 0.15

## 2022-07-24 NOTE — TOC Progression Note (Signed)
Transition of Care (TOC) - Progression Note    Patient Details  Name: Micheal E Goldman Jr. MRN: [id number] Date of Birth: 05/29/1951  Transition of Care (TOC) CM/SW Contact  HOYLE, LUCY, LCSW Phone Number: 07/24/2022, 2:13 PM  Clinical Narrative:    Met with pt today to review SNF bed offers and he has accepted bed at [facility].  Have begun insurance authorization and hopeful can admit to facility tomorrow.   Expected Discharge Plan: Skilled Nursing Facility Barriers to Discharge: Continued Medical Work up  Expected Discharge Plan and Services Expected Discharge Plan: Skilled Nursing Facility                                               Social Determinants of Health (SDOH) Interventions    Readmission Risk Interventions     No data to display          

## 2022-07-24 NOTE — Plan of Care (Signed)
  Problem: Fluid Volume: Goal: Ability to maintain a balanced intake and output will improve Outcome: Progressing   Problem: Metabolic: Goal: Ability to maintain appropriate glucose levels will improve Outcome: Progressing   Problem: Nutritional: Goal: Maintenance of adequate nutrition will improve Outcome: Progressing   

## 2022-07-24 NOTE — Progress Notes (Signed)
  Progress Note   Patient: Micheal Moore. VHQ:[id number] DOB: [birth year] DOA: 07/19/2022     5 DOS: the patient was seen and examined on 07/24/2022   Brief hospital course: 71 y.o. male with medical history significant of IIDM, orthostatic hypotension on midodrine, HTN, HLD, chronic HFpEF, DVT history of presented with fall and left hip pain.   Patient was walking his dog this morning in the yard, and fell down forward while turning around to tend his dog. No LOC, no head injury, he denies any prodromes of lightheadedness blurry vision nauseous shortness of breath.  He has chronic orthostatic hypotension on midodrine and fludrocortisone, he reported occasionally feeling lightheadedness while rising up but denied any such episode happened today.  He had a similar event in June this year when he fractured right femoral neck underwent ORIF.  Assessment and Plan: Left femoral neck fracture, closed -Pt underwent L total hip arthoplasty 11/3 per Ortho -Cont with analgesia as needed -PT recs for SNF. TOC following, hopefully can admit tomorrow. Will follow   Orthostatic hypotension -Appears to be stable, had been on fludrocortisone -Appears to be euvolemic on exam -midodrine was d/c as pt was hypertensive -Will continue with Leg compression hose and abd binder when up -Stable at this time   IIDM with hyperglycemia -Hold off sulfonylurea -continue sliding scale while in hospital -glucose suboptimally controlled. Will increase to 15 units   HLD -On Statin  Likely osteoporosis -Pt describes now multiple fractures involving seemingly minor injuries -consider bone density and starting bisphosphonate as outpatient      Subjective: Without complaints this AM  Physical Exam: Vitals:   07/24/22 0617 07/24/22 0945 07/24/22 1350 07/24/22 1350  BP: (!) 154/93 (!) 144/76 136/71 136/71  Pulse: 91 88 92 94  Resp: '20  17 17  '$ Temp: 98.6 F (37 C)  99.4 F (37.4 C) 99.4 F (37.4 C)   TempSrc: Oral  Oral Oral  SpO2: 98%  99% 99%  Weight:      Height:       General exam: Awake, laying in bed, in nad Respiratory system: Normal respiratory effort, no wheezing Cardiovascular system: regular rate, s1, s2 Gastrointestinal system: Soft, nondistended, positive BS Central nervous system: CN2-12 grossly intact, strength intact Extremities: Perfused, no clubbing Skin: Normal skin turgor, no notable skin lesions seen Psychiatry: Mood normal // no visual hallucinations   Data Reviewed:  There are no new results to review at this time.  Family Communication: Pt in room, family not at bedside  Disposition: Status is: Inpatient Remains inpatient appropriate because: Severity of illness  Planned Discharge Destination: Skilled nursing facility    Author: Marylu Lund, MD 07/24/2022 2:33 PM  For on call review [email or web address].

## 2022-07-24 NOTE — Progress Notes (Signed)
Physical Therapy Treatment Patient Details Name: Micheal Moore. MRN: [id number] DOB: 12-22-1950 Today's Date: 07/24/2022   History of Present Illness Micheal Moore. is a 71 y.o. male with medical history significant of IIDM, orthostatic hypotension on midodrine, HTN, HLD, chronic HFpEF, DVT history of presented with fall and left hip pain. Patient s/p ORIF left femoral neck fracture.    PT Comments    Patient stated he had 5 pain in L hip, no increase in pain throughout session. "I know it's going to hurt worse when I stand up, it always does." Monitored BP throughout session. EOB BP was 144/75 (88) and after ambulation BP was 142/84 (100). Required min assist for bed mobility w/ use of belt to guide L LE off of bed and min assist for ambulation. Cues needed for correct hand placement during transfers and proximity of RW during ambulation. Impaired safety cognition. Pt will need ST Rehab at SNF to address mobility and functional decline prior to safely returning home.   Recommendations for follow up therapy are one component of a multi-disciplinary discharge planning process, led by the attending physician.  Recommendations may be updated based on patient status, additional functional criteria and insurance authorization.  Follow Up Recommendations  Skilled nursing-short term rehab (<3 hours/day) Can patient physically be transported by private vehicle: Yes   Assistance Recommended at Discharge Frequent or constant Supervision/Assistance  Patient can return home with the following A little help with walking and/or transfers;A little help with bathing/dressing/bathroom;Assist for transportation;Help with stairs or ramp for entrance   Equipment Recommendations  None recommended by PT    Recommendations for Other Services       Precautions / Restrictions Precautions Precautions: Fall Precaution Comments: (applied TEDS + ABD Binder) Restrictions Weight Bearing  Restrictions: No Other Position/Activity Restrictions: WBAT L LE     Mobility  Bed Mobility Overal bed mobility: Needs Assistance Bed Mobility: Supine to Sit     Supine to sit: Min assist     General bed mobility comments: Increased time with assist for progression of L LE to EOB w/ use of belt and for trunk to upright.    Transfers Overall transfer level: Needs assistance Equipment used: Rolling walker (2 wheels) Transfers: Sit to/from Stand Sit to Stand: Mod assist           General transfer comment: cues for hand placement; physical assist to bring wt up and fwd and to balance in standing with RW    Ambulation/Gait Ambulation/Gait assistance: Min assist Gait Distance (Feet): 49 Feet Assistive device: Rolling walker (2 wheels) Gait Pattern/deviations: Step-to pattern, Decreased stride length, Decreased weight shift to left, Knee flexed in stance - left, Trunk flexed Gait velocity: decreased     General Gait Details: pt c/o of cervical pain, states he's always had it.   Stairs             Wheelchair Mobility    Modified Rankin (Stroke Patients Only)       Balance                                            Cognition Arousal/Alertness: Awake/alert Behavior During Therapy: WFL for tasks assessed/performed Overall Cognitive Status: Within Functional Limits for tasks assessed  General Comments: AxO x 3 pleasant requiring repeat instructions/easily distracted        Exercises      General Comments        Pertinent Vitals/Pain Pain Assessment Pain Assessment: 0-10 Pain Score: 5  Pain Location: L hip Pain Descriptors / Indicators: Grimacing, Sore, Discomfort Pain Intervention(s): Limited activity within patient's tolerance, Monitored during session, Premedicated before session, Ice applied    Home Living Family/patient expects to be discharged to:: Private residence Living  Arrangements: Spouse/significant other Available Help at Discharge: Family;Friend(s) Type of Home: House Home Access: Stairs to enter Entrance Stairs-Rails: None Entrance Stairs-Number of Steps: 2-3 steps to porch and one into house   Home Layout: One level Home Equipment: Conservation officer, nature (2 wheels) Additional Comments: sister can assist as needed, lives nearby.    Prior Function            PT Goals (current goals can now be found in the care plan section) Acute Rehab PT Goals Patient Stated Goal: less pain PT Goal Formulation: With patient Time For Goal Achievement: 08/04/22 Potential to Achieve Goals: Good    Frequency    Min 3X/week      PT Plan      Co-evaluation              AM-PAC PT "6 Clicks" Mobility   Outcome Measure  Help needed turning from your back to your side while in a flat bed without using bedrails?: A Little Help needed moving from lying on your back to sitting on the side of a flat bed without using bedrails?: A Little Help needed moving to and from a bed to a chair (including a wheelchair)?: A Little Help needed standing up from a chair using your arms (e.g., wheelchair or bedside chair)?: A Little Help needed to walk in hospital room?: A Little Help needed climbing 3-5 steps with a railing? : Total 6 Click Score: 16    End of Session Equipment Utilized During Treatment: Gait belt Activity Tolerance: Patient tolerated treatment well;Patient limited by fatigue Patient left: in chair;with call bell/phone within reach;with chair alarm set Nurse Communication: Mobility status PT Visit Diagnosis: Unsteadiness on feet (R26.81);Muscle weakness (generalized) (M62.81);Pain Pain - Right/Left: Left Pain - part of body: Hip     Time: 5625-6389 PT Time Calculation (min) (ACUTE ONLY): 25 min  Charges:  $Gait Training: 8-22 mins $Therapeutic Activity: 8-22 mins                        Micheal Moore 07/24/2022, 1:26 PM

## 2022-07-24 NOTE — Progress Notes (Signed)
Occupational Therapy Treatment Patient Details Name: Micheal Moore. MRN: [id number] DOB: Jun 21, 1951 Today's Date: 07/24/2022   History of present illness Micheal Moore. is a 71 y.o. male with medical history significant of IIDM, orthostatic hypotension on midodrine, HTN, HLD, chronic HFpEF, DVT history of presented with fall and left hip pain. Patient s/p ORIF left femoral neck fracture.   OT comments  Patient was noted to have continued pain with all movement of LLE limiting session. Patient was noted to have BP drop from 112/70 mmhg seated to 92/43 mmhg standing with nurse present in room. Patient was educated on weight shifting in bed to reduce pressure on bottom with patient able to demonstrate understanding with MI with use of bed rails. Patient would continue to benefit from skilled OT services at this time while admitted and after d/c to address noted deficits in order to improve overall safety and independence in ADLs.     Recommendations for follow up therapy are one component of a multi-disciplinary discharge planning process, led by the attending physician.  Recommendations may be updated based on patient status, additional functional criteria and insurance authorization.    Follow Up Recommendations  Skilled nursing-short term rehab (<3 hours/day)    Assistance Recommended at Discharge Frequent or constant Supervision/Assistance  Patient can return home with the following  A little help with walking and/or transfers;A lot of help with bathing/dressing/bathroom;Assistance with cooking/housework;Help with stairs or ramp for entrance   Equipment Recommendations  BSC/3in1    Recommendations for Other Services      Precautions / Restrictions Precautions Precautions: Fall Precaution Comments: (applied TEDS + ABD Binder) Restrictions Weight Bearing Restrictions: No Other Position/Activity Restrictions: WBAT L LE       Mobility Bed Mobility Overal bed  mobility: Needs Assistance Bed Mobility: Sit to Supine       Sit to supine: Min guard   General bed mobility comments: with increased time and use of modified gait belt for leg lifter    Transfers                         Balance           Standing balance support: During functional activity, Reliant on assistive device for balance Standing balance-[name]: Poor                             ADL either performed or assessed with clinical judgement   ADL Overall ADL's : Needs assistance/impaired                                     Functional mobility during ADLs: Minimal assistance;+2 for safety/equipment;Rolling walker (2 wheels) General ADL Comments: patient reported increased pain in LLE with attempted standing. patients BP in standing was 92/43 mmhg with HR of 109 bpm. patients BP prior to standing was 112/70 mmhg. patient declined ADl tasks at this time opting to transition back to bed after " long PT session". patient was educated on importacne of weight shifting in bed to reduce risk of skin breakdown with patient demonstrating ability to weight shift side to side in bed and hold position for 30 seconds per side. patient agreed to attempt this at least once an hour while awake    Extremity/Trunk Assessment  Vision       Perception     Praxis      Cognition Arousal/Alertness: Awake/alert Behavior During Therapy: WFL for tasks assessed/performed Overall Cognitive Status: Within Functional Limits for tasks assessed                                          Exercises      Shoulder Instructions       General Comments      Pertinent Vitals/ Pain       Pain Assessment Pain Assessment: 0-10 Pain Score: 7  Pain Location: L hip Pain Descriptors / Indicators: Grimacing, Sore, Discomfort Pain Intervention(s): Limited activity within patient's tolerance, Premedicated before session  Home  Living Family/patient expects to be discharged to:: Private residence Living Arrangements: Spouse/significant other Available Help at Discharge: Family;Friend(s) Type of Home: House Home Access: Stairs to enter [employer] of Steps: 2-3 steps to porch and one into house Entrance Stairs-Rails: None Home Layout: One level     Bathroom Shower/Tub: Teacher, early years/pre: Standard     Home Equipment: Conservation officer, nature (2 wheels)   Additional Comments: sister can assist as needed, lives nearby.      Prior Functioning/Environment              Frequency  Min 2X/week        Progress Toward Goals  OT Goals(current goals can now be found in the care plan section)  Progress towards OT goals: Progressing toward goals     Plan Discharge plan remains appropriate    Co-evaluation                 AM-PAC OT "6 Clicks" Daily Activity     Outcome Measure   Help from another person eating meals?: None Help from another person taking care of personal grooming?: A Little Help from another person toileting, which includes using toliet, bedpan, or urinal?: A Lot Help from another person bathing (including washing, rinsing, drying)?: A Lot Help from another person to put on and taking off regular upper body clothing?: A Little Help from another person to put on and taking off regular lower body clothing?: A Lot 6 Click Score: 16    End of Session Equipment Utilized During Treatment: Rolling walker (2 wheels);Gait belt  OT Visit Diagnosis: Other abnormalities of gait and mobility (R26.89);Pain Pain - Right/Left: Left Pain - part of body: Hip   Activity Tolerance Patient tolerated treatment well   Patient Left in bed;with call bell/phone within reach   Nurse Communication Other (comment) (nurse was present during session)        Time: 8502-7741 OT Time Calculation (min): 22 min  Charges: OT General Charges $OT Visit: 1 Visit OT  Treatments $Therapeutic Activity: 8-22 mins  Rennie Plowman, MS Acute Rehabilitation Department Office# [phone]   Marcellina Millin 07/24/2022, 1:29 PM

## 2022-07-25 DIAGNOSIS — L89896 Pressure-induced deep tissue damage of other site: Secondary | ICD-10-CM | POA: Diagnosis not present

## 2022-07-25 DIAGNOSIS — I152 Hypertension secondary to endocrine disorders: Secondary | ICD-10-CM | POA: Diagnosis not present

## 2022-07-25 DIAGNOSIS — I480 Paroxysmal atrial fibrillation: Secondary | ICD-10-CM | POA: Diagnosis not present

## 2022-07-25 DIAGNOSIS — R509 Fever, unspecified: Secondary | ICD-10-CM | POA: Diagnosis not present

## 2022-07-25 DIAGNOSIS — E118 Type 2 diabetes mellitus with unspecified complications: Secondary | ICD-10-CM | POA: Diagnosis not present

## 2022-07-25 DIAGNOSIS — E119 Type 2 diabetes mellitus without complications: Secondary | ICD-10-CM | POA: Diagnosis not present

## 2022-07-25 DIAGNOSIS — I509 Heart failure, unspecified: Secondary | ICD-10-CM | POA: Diagnosis not present

## 2022-07-25 DIAGNOSIS — E785 Hyperlipidemia, unspecified: Secondary | ICD-10-CM | POA: Diagnosis not present

## 2022-07-25 DIAGNOSIS — Z7901 Long term (current) use of anticoagulants: Secondary | ICD-10-CM | POA: Diagnosis not present

## 2022-07-25 DIAGNOSIS — R531 Weakness: Secondary | ICD-10-CM | POA: Diagnosis not present

## 2022-07-25 DIAGNOSIS — Y92002 Bathroom of unspecified non-institutional (private) residence single-family (private) house as the place of occurrence of the external cause: Secondary | ICD-10-CM | POA: Diagnosis not present

## 2022-07-25 DIAGNOSIS — M8580 Other specified disorders of bone density and structure, unspecified site: Secondary | ICD-10-CM | POA: Diagnosis not present

## 2022-07-25 DIAGNOSIS — Z4789 Encounter for other orthopedic aftercare: Secondary | ICD-10-CM | POA: Diagnosis not present

## 2022-07-25 DIAGNOSIS — Z86718 Personal history of other venous thrombosis and embolism: Secondary | ICD-10-CM | POA: Diagnosis not present

## 2022-07-25 DIAGNOSIS — T84021A Dislocation of internal left hip prosthesis, initial encounter: Secondary | ICD-10-CM | POA: Diagnosis not present

## 2022-07-25 DIAGNOSIS — Z7401 Bed confinement status: Secondary | ICD-10-CM | POA: Diagnosis not present

## 2022-07-25 DIAGNOSIS — M810 Age-related osteoporosis without current pathological fracture: Secondary | ICD-10-CM | POA: Diagnosis not present

## 2022-07-25 DIAGNOSIS — R059 Cough, unspecified: Secondary | ICD-10-CM | POA: Diagnosis not present

## 2022-07-25 DIAGNOSIS — I110 Hypertensive heart disease with heart failure: Secondary | ICD-10-CM | POA: Diagnosis not present

## 2022-07-25 DIAGNOSIS — Z471 Aftercare following joint replacement surgery: Secondary | ICD-10-CM | POA: Diagnosis not present

## 2022-07-25 DIAGNOSIS — S79912A Unspecified injury of left hip, initial encounter: Secondary | ICD-10-CM | POA: Diagnosis present

## 2022-07-25 DIAGNOSIS — Z96642 Presence of left artificial hip joint: Secondary | ICD-10-CM | POA: Diagnosis not present

## 2022-07-25 DIAGNOSIS — E1159 Type 2 diabetes mellitus with other circulatory complications: Secondary | ICD-10-CM | POA: Diagnosis not present

## 2022-07-25 DIAGNOSIS — I10 Essential (primary) hypertension: Secondary | ICD-10-CM | POA: Diagnosis not present

## 2022-07-25 DIAGNOSIS — D62 Acute posthemorrhagic anemia: Secondary | ICD-10-CM | POA: Diagnosis not present

## 2022-07-25 DIAGNOSIS — I951 Orthostatic hypotension: Secondary | ICD-10-CM

## 2022-07-25 DIAGNOSIS — Z7984 Long term (current) use of oral hypoglycemic drugs: Secondary | ICD-10-CM | POA: Diagnosis not present

## 2022-07-25 DIAGNOSIS — E1169 Type 2 diabetes mellitus with other specified complication: Secondary | ICD-10-CM | POA: Diagnosis not present

## 2022-07-25 DIAGNOSIS — I959 Hypotension, unspecified: Secondary | ICD-10-CM | POA: Diagnosis not present

## 2022-07-25 DIAGNOSIS — S72002A Fracture of unspecified part of neck of left femur, initial encounter for closed fracture: Secondary | ICD-10-CM | POA: Diagnosis not present

## 2022-07-25 DIAGNOSIS — Z79899 Other long term (current) drug therapy: Secondary | ICD-10-CM | POA: Diagnosis not present

## 2022-07-25 DIAGNOSIS — E2740 Unspecified adrenocortical insufficiency: Secondary | ICD-10-CM | POA: Diagnosis not present

## 2022-07-25 DIAGNOSIS — W1830XA Fall on same level, unspecified, initial encounter: Secondary | ICD-10-CM | POA: Diagnosis not present

## 2022-07-25 DIAGNOSIS — S73005A Unspecified dislocation of left hip, initial encounter: Secondary | ICD-10-CM | POA: Diagnosis not present

## 2022-07-25 DIAGNOSIS — M25473 Effusion, unspecified ankle: Secondary | ICD-10-CM | POA: Diagnosis not present

## 2022-07-25 DIAGNOSIS — D6859 Other primary thrombophilia: Secondary | ICD-10-CM | POA: Diagnosis not present

## 2022-07-25 DIAGNOSIS — S72002D Fracture of unspecified part of neck of left femur, subsequent encounter for closed fracture with routine healing: Secondary | ICD-10-CM | POA: Diagnosis not present

## 2022-07-25 DIAGNOSIS — R262 Difficulty in walking, not elsewhere classified: Secondary | ICD-10-CM | POA: Diagnosis not present

## 2022-07-25 DIAGNOSIS — W19XXXA Unspecified fall, initial encounter: Secondary | ICD-10-CM | POA: Diagnosis not present

## 2022-07-25 DIAGNOSIS — T68XXXA Hypothermia, initial encounter: Secondary | ICD-10-CM | POA: Diagnosis not present

## 2022-07-25 LAB — COMPREHENSIVE METABOLIC PANEL
ALT: 15 U/L (ref 0–44)
AST: 17 U/L (ref 15–41)
Albumin: 2.7 g/dL — ABNORMAL LOW (ref 3.5–5.0)
Alkaline Phosphatase: 65 U/L (ref 38–126)
Anion gap: 8 (ref 5–15)
BUN: 28 mg/dL — ABNORMAL HIGH (ref 8–23)
CO2: 27 mmol/L (ref 22–32)
Calcium: 8.8 mg/dL — ABNORMAL LOW (ref 8.9–10.3)
Chloride: 101 mmol/L (ref 98–111)
Creatinine, Ser: 0.68 mg/dL (ref 0.61–1.24)
GFR, Estimated: >60 mL/min (ref >60)
Glucose, Bld: 272 mg/dL — ABNORMAL HIGH (ref 70–99)
Potassium: 4.1 mmol/L (ref 3.5–5.1)
Sodium: 136 mmol/L (ref 135–145)
Total Bilirubin: 0.6 mg/dL (ref 0.3–1.2)
Total Protein: 5.6 g/dL — ABNORMAL LOW (ref 6.5–8.1)

## 2022-07-25 LAB — CBC
HCT: 28.8 % — ABNORMAL LOW (ref 39.0–52.0)
Hemoglobin: 9.6 g/dL — ABNORMAL LOW (ref 13.0–17.0)
MCH: 31.4 pg (ref 26.0–34.0)
MCHC: 33.3 g/dL (ref 30.0–36.0)
MCV: 94.1 fL (ref 80.0–100.0)
Platelets: 206 10*3/uL (ref 150–400)
RBC: 3.06 MIL/uL — ABNORMAL LOW (ref 4.22–5.81)
RDW: 13.9 % (ref 11.5–15.5)
WBC: 7.8 10*3/uL (ref 4.0–10.5)
nRBC: 0 % (ref 0.0–0.2)

## 2022-07-25 LAB — GLUCOSE, CAPILLARY
Glucose-Capillary: 259 mg/dL — ABNORMAL HIGH (ref 70–99)
Glucose-Capillary: 259 mg/dL — ABNORMAL HIGH (ref 70–99)

## 2022-07-25 MED ORDER — INSULIN GLARGINE-YFGN 100 UNIT/ML ~~LOC~~ SOLN
5.0000 [IU] | Freq: Once | SUBCUTANEOUS | Status: AC
  Administered 2022-07-25: 5 [IU] via SUBCUTANEOUS
  Filled 2022-07-25: qty 0.05

## 2022-07-25 MED ORDER — MIDODRINE HCL 5 MG PO TABS: 2.5000 mg | ORAL_TABLET | Freq: Three times a day (TID) | ORAL | Status: DC

## 2022-07-25 MED ORDER — ENSURE MAX PROTEIN PO LIQD: 11.0000 [oz_av] | Freq: Two times a day (BID) | ORAL | Status: DC

## 2022-07-25 MED ORDER — INSULIN GLARGINE-YFGN 100 UNIT/ML ~~LOC~~ SOLN
20.0000 [IU] | Freq: Every day | SUBCUTANEOUS | Status: DC
  Filled 2022-07-25: qty 0.2

## 2022-07-25 MED ORDER — SENNA 8.6 MG PO TABS: 1.0000 | ORAL_TABLET | Freq: Two times a day (BID) | ORAL | 0 refills | Status: DC

## 2022-07-25 MED ORDER — METOPROLOL TARTRATE 25 MG PO TABS: 12.5000 mg | ORAL_TABLET | Freq: Two times a day (BID) | ORAL | Status: DC

## 2022-07-25 NOTE — Progress Notes (Signed)
Physical Therapy Treatment Patient Details Name: Micheal Moore. MRN: [id number] DOB: 01/09/1951 Today's Date: 07/25/2022   History of Present Illness Micheal Moore. is a 71 y.o. male with medical history significant of IIDM, orthostatic hypotension on midodrine, HTN, HLD, chronic HFpEF, DVT history of presented with fall and left hip pain. Patient s/p ORIF left femoral neck fracture.    PT Comments    Patient stated he had 3 pain in L anterior thigh at rest with no increase in pain throughout session. Required min assist for bed mobility to guide L LE off of bed with modified safety belt, and min assist for transfers  to balance in initial standing w/ RW and min assist for ambulation for safety/recliner follow. Able to recall safe hand placement. Pt will need ST Rehab at SNF to address mobility and functional decline prior to safely returning home.   Recommendations for follow up therapy are one component of a multi-disciplinary discharge planning process, led by the attending physician.  Recommendations may be updated based on patient status, additional functional criteria and insurance authorization.  Follow Up Recommendations  Skilled nursing-short term rehab (<3 hours/day) Can patient physically be transported by private vehicle: Yes   Assistance Recommended at Discharge Frequent or constant Supervision/Assistance  Patient can return home with the following A little help with walking and/or transfers;A little help with bathing/dressing/bathroom;Assist for transportation;Help with stairs or ramp for entrance   Equipment Recommendations  None recommended by PT    Recommendations for Other Services       Precautions / Restrictions Precautions Precautions: Fall Precaution Comments: (applied TEDS + ABD Binder) Restrictions Weight Bearing Restrictions: No Other Position/Activity Restrictions: WBAT L LE     Mobility  Bed Mobility Overal bed mobility: Needs  Assistance Bed Mobility: Sit to Supine     Supine to sit: Min assist     General bed mobility comments: with increased time and use of modified gait belt for leg lifter    Transfers Overall transfer level: Needs assistance Equipment used: Rolling walker (2 wheels) Transfers: Sit to/from Stand Sit to Stand: Min assist           General transfer comment: Able to recall safe hand placement; min assist needed to balance in initial standing w/ RW.    Ambulation/Gait Ambulation/Gait assistance: Min assist Gait Distance (Feet): 47 Feet Assistive device: Rolling walker (2 wheels) Gait Pattern/deviations: Step-to pattern, Decreased stride length, Decreased weight shift to left, Knee flexed in stance - left, Trunk flexed Gait velocity: decreased     General Gait Details: min assist needed for safety and recliner follow.   Stairs             Wheelchair Mobility    Modified Rankin (Stroke Patients Only)       Balance                                            Cognition Arousal/Alertness: Awake/alert Behavior During Therapy: WFL for tasks assessed/performed Overall Cognitive Status: Within Functional Limits for tasks assessed                                 General Comments: AxO x 3 pleasant requiring repeat instructions/easily distracted        Exercises      General Comments  Pertinent Vitals/Pain Pain Assessment Pain Assessment: 0-10 Pain Score: 3  Pain Location: L hip (anterior) Pain Descriptors / Indicators: Grimacing, Sore, Discomfort Pain Intervention(s): Limited activity within patient's tolerance, Monitored during session, Ice applied, Premedicated before session    Home Living                          Prior Function            PT Goals (current goals can now be found in the care plan section) Acute Rehab PT Goals Patient Stated Goal: less pain PT Goal Formulation: With patient Time  For Goal Achievement: 08/04/22 Potential to Achieve Goals: Good Progress towards PT goals: Progressing toward goals    Frequency    Min 3X/week      PT Plan Current plan remains appropriate    Co-evaluation              AM-PAC PT "6 Clicks" Mobility   Outcome Measure  Help needed turning from your back to your side while in a flat bed without using bedrails?: A Little Help needed moving from lying on your back to sitting on the side of a flat bed without using bedrails?: A Little Help needed moving to and from a bed to a chair (including a wheelchair)?: A Little Help needed standing up from a chair using your arms (e.g., wheelchair or bedside chair)?: A Little Help needed to walk in hospital room?: A Little Help needed climbing 3-5 steps with a railing? : Total 6 Click Score: 16    End of Session Equipment Utilized During Treatment: Gait belt Activity Tolerance: Patient tolerated treatment well;No increased pain Patient left: in chair;with call bell/phone within reach;with chair alarm set Nurse Communication: Mobility status PT Visit Diagnosis: Unsteadiness on feet (R26.81);Muscle weakness (generalized) (M62.81);Pain Pain - Right/Left: Left Pain - part of body: Hip     Time: 8416-6063 PT Time Calculation (min) (ACUTE ONLY): 24 min  Charges:  $Gait Training: 8-22 mins $Therapeutic Activity: 8-22 mins                        Allyson Sabal 07/25/2022, 12:28 PM

## 2022-07-25 NOTE — Plan of Care (Signed)
  Problem: Fluid Volume: Goal: Ability to maintain a balanced intake and output will improve Outcome: Progressing   Problem: Metabolic: Goal: Ability to maintain appropriate glucose levels will improve Outcome: Progressing   Problem: Nutritional: Goal: Maintenance of adequate nutrition will improve Outcome: Progressing   

## 2022-07-25 NOTE — Discharge Summary (Signed)
Physician Discharge Summary  Micheal Moore. NLZ:[id number] DOB: March 16, 1951 DOA: 07/19/2022  PCP: [name], MD  Admit date: 07/19/2022 Discharge date: 07/25/2022  Time spent: 60 minutes  Recommendations for Outpatient Follow-up:  Follow-up with Dr. Lyla Glassing, orthopedics in 2 weeks. Follow-up with MD at skilled nursing facility.  Patient will need a basic metabolic profile done in 1 week to follow-up on electrolytes and renal function.  Patient's diabetes will need to be followed up upon.   Discharge Diagnoses:  Principal Problem:   Hip fracture ([place]) Active Problems:   Malnutrition of moderate degree   Discharge Condition: Stable and improved  Diet recommendation: Carb modified diet  Filed Weights   07/19/22 1131 07/20/22 1355  Weight: 88.5 kg 88.5 kg    History of present illness:  HPI per Dr. Baron Sane. is a 71 y.o. male with medical history significant of IIDM, orthostatic hypotension on midodrine, HTN, HLD, chronic HFpEF, DVT history of presented with fall and left hip pain.   Patient was walking his dog this morning in the yard, and fell down forward while turning around to tend his dog. No LOC, no head injury, he denies any prodromes of lightheadedness blurry vision nauseous shortness of breath.  He has chronic orthostatic hypotension on midodrine and fludrocortisone, he reported occasionally feeling lightheadedness while rising up but denied any such episode happened today.  He had a similar event in June this year when he fractured right femoral neck underwent ORIF.   ED Course: No tachycardia no hypotension afebrile.  X-ray showed left femoral neck fracture.  Head and neck CT negative for fracture or dislocation.  Chest x-ray no infiltrates.  Labs reviewed, glucose elevated.  Hospital Course:  #1 left femoral neck fracture, closed, POA -Patient noted to have presented with fall with left hip pain, plain films done concerning for left  femoral neck fracture. -Patient seen in consultation by orthopedics and patient underwent left total hip arthroplasty 07/20/2022. -Patient seen by PT/OT recommended SNF placement. -DVT prophylaxis, pain management were managed per orthopedics. -Outpatient follow-up with orthopedics.  2.  Orthostatic hypotension -Noted to be stable.  Patient noted to be normal on fludrocortisone and midodrine. -Patient noted to be euvolemic on examination. -Due to hypertensive episodes midodrine was discontinued during the hospitalization patient maintained on compression home Abdominal binder. -BP improved, patient be placed back on home regimen of midodrine 2.5 mg 3 times daily on discharge in addition to fludrocortisone.  3.  Type 2 diabetes mellitus with hyperglycemia -Sulfonylurea held during the hospitalization and patient maintained on long-acting Semglee and dose uptitrated to 20 units daily in addition to SSI. -Patient will be discharged back on home regimen of Jardiance and glimepiride. -Outpatient follow-up.  4.  Hyperlipidemia -Patient maintained on statin.  5.  Likely osteoporosis -Patient described to Dr. Wyline Copas that he had multiple fractures involving minor injuries. -We will need consideration for outpatient bone density scan and starting patient on bisphosphonate which can be done in the outpatient setting.  Procedures: Left total hip arthroplasty per orthopedics: Dr. Lyla Glassing 07/20/2022 CT head CT C-spine 07/19/2022 Plain films of the pelvis 07/20/2022 Plain films of the left knee 07/19/2022 Chest x-ray 07/19/2022 Plain films of the pelvis and left hip 07/19/2022  Consultations: orthopedics: Dr. Lyla Glassing 07/20/2022  Discharge Exam: Vitals:   07/25/22 0811 07/25/22 1350  BP: (!) 155/78 (!) 127/52  Pulse: 89 88  Resp:  16  Temp:  98.4 F (36.9 C)  SpO2:  100%  General: NAD Cardiovascular: RRR no murmurs rubs or gallops.  No JVD.  No lower extremity edema. Respiratory: Clear to  auscultation bilaterally anterior lung fields.  Discharge Instructions   Discharge Instructions     Diet Carb Modified   Complete by: As directed    Increase activity slowly   Complete by: As directed    No wound care   Complete by: As directed       Allergies as of 07/25/2022   No Known Allergies      Medication List     STOP taking these medications    docusate sodium 100 MG capsule Commonly known as: Colace   metFORMIN 500 MG tablet Commonly known as: GLUCOPHAGE   nicotine 14 mg/24hr patch Commonly known as: NICODERM CQ - dosed in mg/24 hours   sitaGLIPtin 50 MG tablet Commonly known as: Januvia       TAKE these medications    apixaban 2.5 MG Tabs tablet Commonly known as: Eliquis Take 1 tablet (2.5 mg total) by mouth 2 (two) times daily.   Ensure Max Protein Liqd Take 330 mLs (11 oz total) by mouth 2 (two) times daily. What changed:  how much to take when to take this   fludrocortisone 0.1 MG tablet Commonly known as: FLORINEF Take 2 tablets (0.2 mg total) by mouth daily.   glimepiride 2 MG tablet Commonly known as: AMARYL Take 2 mg by mouth daily.   HYDROcodone-acetaminophen 5-325 MG tablet Commonly known as: NORCO/VICODIN Take 1 tablet by mouth every 4 (four) hours as needed for up to 7 days for moderate pain.   Jardiance 10 MG Tabs tablet Generic drug: empagliflozin Take 10 mg by mouth daily.   metoprolol tartrate 25 MG tablet Commonly known as: LOPRESSOR Take 0.5 tablets (12.5 mg total) by mouth 2 (two) times daily.   midodrine 5 MG tablet Commonly known as: PROAMATINE Take 0.5 tablets (2.5 mg total) by mouth 3 (three) times daily with meals.   polyethylene glycol 17 g packet Commonly known as: MIRALAX / GLYCOLAX Take 17 g by mouth daily.   rosuvastatin 5 MG tablet Commonly known as: CRESTOR Take 5 mg by mouth See admin instructions. Take one tablet by mouth on Tuesday & Saturdays   senna 8.6 MG Tabs tablet Commonly known  as: SENOKOT Take 1 tablet (8.6 mg total) by mouth 2 (two) times daily.       No Known Allergies  Contact information for follow-up providers     Rod Can, MD Follow up in 2 week(s).   Specialty: Orthopedic Surgery Why: For suture removal, For wound re-check Contact information: [address] [place] [address] [phone]         MD AT SNF Follow up.               Contact information for after-discharge care     Destination     [facility] .   Service: Skilled Nursing Contact information: [address] [place] [phone]                      The results of significant diagnostics from this hospitalization (including imaging, microbiology, ancillary and laboratory) are listed below for reference.    Significant Diagnostic Studies: DG Pelvis Portable  Result Date: 07/20/2022 CLINICAL DATA:  Postop hip replacement EXAM: PORTABLE PELVIS 1-2 VIEWS COMPARISON:  02/23/2022, 07/19/2022 6 FINDINGS: Previous intramedullary rodding of right femur with abundant callus. Interval left hip replacement with  intact hardware and normal alignment. Gas in the soft tissues consistent with recent surgery IMPRESSION: Interval left hip replacement with expected postoperative change. Electronically Signed   By: Donavan Foil M.D.   On: 07/20/2022 18:28   DG HIP PORT UNILAT WITH PELVIS 1V LEFT  Result Date: 07/20/2022 CLINICAL DATA:  Left hip surgery. EXAM: DG HIP (WITH OR WITHOUT PELVIS) 1V PORT LEFT COMPARISON:  Preoperative radiograph FINDINGS: Two fluoroscopic spot views of the pelvis and left hip obtained in the operating room. Left hip arthroplasty in place. Fluoroscopy time 18 seconds. Dose 1.8914 mGy. IMPRESSION: Intraoperative fluoroscopy during left hip arthroplasty. Electronically Signed   By: Keith Rake M.D.   On: 07/20/2022 18:17   DG C-Arm 1-60 Min-No Report  Result Date:  07/20/2022 Fluoroscopy was utilized by the requesting physician.  No radiographic interpretation.   DG C-Arm 1-60 Min-No Report  Result Date: 07/20/2022 Fluoroscopy was utilized by the requesting physician.  No radiographic interpretation.   DG Knee Left Port  Result Date: 07/19/2022 CLINICAL DATA:  Left femoral neck fracture, fell, left hip pain EXAM: PORTABLE LEFT KNEE - 1-2 VIEW COMPARISON:  07/19/2022 FINDINGS: Frontal and lateral views of the left knee are obtained on 2 images. There are no acute fractures, subluxation, or dislocation. There is mild 3 compartmental osteoarthritis greatest medially. Well corticated ossific density adjacent to the medial tibial plateau likely represent sequela from prior trauma. No joint effusion. Soft tissues are unremarkable. IMPRESSION: 1. No acute fracture. 2. Mild 3 compartmental osteoarthritis greatest medially. Electronically Signed   By: Randa Ngo M.D.   On: 07/19/2022 13:24   CT Head Wo Contrast  Result Date: 07/19/2022 CLINICAL DATA:  Unwitnessed fall. EXAM: CT HEAD WITHOUT CONTRAST CT CERVICAL SPINE WITHOUT CONTRAST TECHNIQUE: Multidetector CT imaging of the head and cervical spine was performed following the standard protocol without intravenous contrast. Multiplanar CT image reconstructions of the cervical spine were also generated. RADIATION DOSE REDUCTION: This exam was performed according to the departmental dose-optimization program which includes automated exposure control, adjustment of the mA and/or kV according to patient size and/or use of iterative reconstruction technique. COMPARISON:  February 22, 2022.  Jan 21, 2021. FINDINGS: CT HEAD FINDINGS Brain: No evidence of acute infarction, hemorrhage, hydrocephalus, extra-axial collection or mass lesion/mass effect. Vascular: No hyperdense vessel or unexpected calcification. Skull: Normal. Negative for fracture or focal lesion. Sinuses/Orbits: No acute finding. Other: None. CT CERVICAL SPINE FINDINGS  Alignment: Minimal grade 1 anterolisthesis of C4-5 is noted secondary to posterior facet joint hypertrophy. Skull base and vertebrae: No acute fracture. No primary bone lesion or focal pathologic process. Soft tissues and spinal canal: No prevertebral fluid or swelling. No visible canal hematoma. Disc levels: Moderate degenerative disc disease is noted at C5-6 and C6-7 with anterior osteophyte formation. Upper chest: Negative. Other: Significant degenerative changes seen involving the left-sided posterior facet joint of C2-3 as well as the right-sided posterior facet joint of C4-5. IMPRESSION: No acute intracranial abnormality seen. Multilevel degenerative disc disease noted the cervical spine. No acute abnormality seen. Electronically Signed   By: Marijo Conception M.D.   On: 07/19/2022 12:29   CT Cervical Spine Wo Contrast  Result Date: 07/19/2022 CLINICAL DATA:  Unwitnessed fall. EXAM: CT HEAD WITHOUT CONTRAST CT CERVICAL SPINE WITHOUT CONTRAST TECHNIQUE: Multidetector CT imaging of the head and cervical spine was performed following the standard protocol without intravenous contrast. Multiplanar CT image reconstructions of the cervical spine were also generated. RADIATION DOSE REDUCTION: This exam was performed according  to the departmental dose-optimization program which includes automated exposure control, adjustment of the mA and/or kV according to patient size and/or use of iterative reconstruction technique. COMPARISON:  February 22, 2022.  Jan 21, 2021. FINDINGS: CT HEAD FINDINGS Brain: No evidence of acute infarction, hemorrhage, hydrocephalus, extra-axial collection or mass lesion/mass effect. Vascular: No hyperdense vessel or unexpected calcification. Skull: Normal. Negative for fracture or focal lesion. Sinuses/Orbits: No acute finding. Other: None. CT CERVICAL SPINE FINDINGS Alignment: Minimal grade 1 anterolisthesis of C4-5 is noted secondary to posterior facet joint hypertrophy. Skull base and  vertebrae: No acute fracture. No primary bone lesion or focal pathologic process. Soft tissues and spinal canal: No prevertebral fluid or swelling. No visible canal hematoma. Disc levels: Moderate degenerative disc disease is noted at C5-6 and C6-7 with anterior osteophyte formation. Upper chest: Negative. Other: Significant degenerative changes seen involving the left-sided posterior facet joint of C2-3 as well as the right-sided posterior facet joint of C4-5. IMPRESSION: No acute intracranial abnormality seen. Multilevel degenerative disc disease noted the cervical spine. No acute abnormality seen. Electronically Signed   By: Marijo Conception M.D.   On: 07/19/2022 12:29   DG Chest 1 View  Result Date: 07/19/2022 CLINICAL DATA:  Fall.  Left hip pain. EXAM: CHEST  1 VIEW COMPARISON:  Chest two views 02/22/2022 and 01/21/2021 FINDINGS: Cardiac silhouette and mediastinal contours are within normal limits. Mild chronic interstitial thickening/scarring is similar to multiple prior studies. This is again greatest within the right lower lung. No pleural effusion or pneumothorax. No acute skeletal abnormality. IMPRESSION: Mild chronic interstitial thickening/scarring is similar to multiple prior studies. No acute lung process. Electronically Signed   By: Yvonne Kendall M.D.   On: 07/19/2022 12:20   DG Hip Unilat W or Wo Pelvis 2-3 Views Left  Result Date: 07/19/2022 CLINICAL DATA:  Fall.  Left hip pain. EXAM: DG HIP (WITH OR WITHOUT PELVIS) 2-3V LEFT COMPARISON:  AP pelvis 02/23/2022 FINDINGS: There is new mild cortical step-off of the proximal left femoral neck indicating an acute fracture with minimal approximate 4 mm superior displacement of the distal fracture component with respect to the proximal fracture component. Partial visualization of right cephalomedullary nail fixation of proximal right femoral intertrochanteric fracture with progressive moderate healing bridging sclerosis and peripheral callus  formation. No evidence of hardware failure. Moderate bilateral femoroacetabular joint space narrowing. No dislocation. There is diffuse decreased bone mineralization. Moderate multilevel degenerative disc changes at L3-4 through L5-S1. IMPRESSION: 1. Acute minimally displaced fracture of the proximal left femoral neck. 2. Partial visualization of right cephalomedullary nail fixation of proximal right femoral intertrochanteric fracture without evidence of hardware failure. Electronically Signed   By: Yvonne Kendall M.D.   On: 07/19/2022 12:09    Microbiology: Recent Results (from the past 240 hour(s))  Surgical pcr screen     Status: None   Collection Time: 07/20/22  8:36 AM   Specimen: Nasal Mucosa; Nasal Swab  Result Value Ref Range Status   MRSA, PCR NEGATIVE NEGATIVE Final   Staphylococcus aureus NEGATIVE NEGATIVE Final    Comment: (NOTE) The Xpert SA Assay (FDA approved for NASAL specimens in patients 2 years of age and older), is one component of a comprehensive surveillance program. It is not intended to diagnose infection nor to guide or monitor treatment. Performed at [facility], [place] [address] [place], [place] [address]      Labs: Basic Metabolic Panel: Recent Labs  Lab 07/19/22 1146 07/21/22 0356 07/22/22 0325 07/25/22 0407  NA 139 136 133* 136  K 3.9 4.1 3.8 4.1  CL 107 102 99 101  CO2 21* '26 28 27  '$ GLUCOSE 237* 291* 275* 272*  BUN 14 26* 25* 28*  CREATININE 0.94 0.93 0.71 0.68  CALCIUM 8.9 8.4* 8.2* 8.8*   Liver Function Tests: Recent Labs  Lab 07/19/22 1146 07/22/22 0325 07/25/22 0407  AST 17 13* 17  ALT '11 10 15  '$ ALKPHOS 91 68 65  BILITOT 1.0 0.7 0.6  PROT 5.8* 5.5* 5.6*  ALBUMIN 3.2* 2.9* 2.7*   No results for input(s): "LIPASE", "AMYLASE" in the last 168 hours. No results for input(s): "AMMONIA" in the last 168 hours. CBC: Recent Labs  Lab 07/19/22 1146 07/20/22 0349 07/21/22 0356 07/22/22 0325 07/25/22 0407  WBC 7.9  9.6 7.3 6.7 7.8  NEUTROABS 5.7  --   --   --   --   HGB 12.7* 12.5* 9.9* 9.3* 9.6*  HCT 39.6 37.9* 30.3* 28.5* 28.8*  MCV 96.6 94.3 95.0 94.7 94.1  PLT 220 209 150 148* 206   Cardiac Enzymes: No results for input(s): "CKTOTAL", "CKMB", "CKMBINDEX", "TROPONINI" in the last 168 hours. BNP: BNP (last 3 results) No results for input(s): "BNP" in the last 8760 hours.  ProBNP (last 3 results) No results for input(s): "PROBNP" in the last 8760 hours.  CBG: Recent Labs  Lab 07/24/22 1200 07/24/22 1726 07/24/22 2135 07/25/22 0730 07/25/22 1220  GLUCAP 308* 252* 241* 259* 259*       Signed:  Irine Seal MD.  [facility] 07/25/2022, 2:21 PM

## 2022-07-25 NOTE — TOC Transition Note (Signed)
Transition of Care [facility]) - CM/SW Discharge Note   Patient Details  Name: Leone Putman. MRN: [id number] Date of Birth: December 21, 1950  Transition of Care [facility]) CM/SW Contact:  Lennart Pall, LCSW Phone Number: 07/25/2022, 2:43 PM   Clinical Narrative:    Have received insurance authorization for [facility] and pt medically cleared for dc today.  Pt and wife aware and agreeable.  PTAR called at 2:40pm.  RN to call report to [phone].  No further TOC needs.   Final next level of care: Skilled Nursing Facility Barriers to Discharge: Barriers Resolved   Patient Goals and CMS Choice        Discharge Placement   Existing PASRR number confirmed : 07/22/22          Patient chooses bed at: [facility] Patient to be transferred to facility by: [place] Name of family member notified: spouse Patient and family notified of of transfer: 07/25/22  Discharge Plan and Services                                     Social Determinants of Health (SDOH) Interventions     Readmission Risk Interventions    07/25/2022    2:42 PM  Readmission Risk Prevention Plan  Transportation Screening Complete  PCP or Specialist Appt within 5-7 Days Complete  Home Care Screening Complete  Medication Review (RN CM) Complete

## 2022-07-25 NOTE — Plan of Care (Signed)
Problem: Education: Goal: Ability to describe self-care measures that may prevent or decrease complications (Diabetes Survival Skills Education) will improve Outcome: Adequate for Discharge Goal: Individualized Educational Video(s) Outcome: Adequate for Discharge   Problem: Coping: Goal: Ability to adjust to condition or change in health will improve Outcome: Adequate for Discharge   Problem: Fluid Volume: Goal: Ability to maintain a balanced intake and output will improve 07/25/2022 1653 by Lise Auer, LPN Outcome: Adequate for Discharge 07/25/2022 1048 by Lise Auer, LPN Outcome: Progressing   Problem: Health Behavior/Discharge Planning: Goal: Ability to identify and utilize available resources and services will improve Outcome: Adequate for Discharge Goal: Ability to manage health-related needs will improve Outcome: Adequate for Discharge   Problem: Metabolic: Goal: Ability to maintain appropriate glucose levels will improve 07/25/2022 1653 by Lise Auer, LPN Outcome: Adequate for Discharge 07/25/2022 1048 by Iver Nestle A, LPN Outcome: Progressing   Problem: Nutritional: Goal: Maintenance of adequate nutrition will improve 07/25/2022 1653 by Lise Auer, LPN Outcome: Adequate for Discharge 07/25/2022 1048 by Lise Auer, LPN Outcome: Progressing Goal: Progress toward achieving an optimal weight will improve Outcome: Adequate for Discharge   Problem: Skin Integrity: Goal: Risk for impaired skin integrity will decrease Outcome: Adequate for Discharge   Problem: Tissue Perfusion: Goal: Adequacy of tissue perfusion will improve Outcome: Adequate for Discharge   Problem: Education: Goal: Knowledge of General Education information will improve Description: Including pain rating scale, medication(s)/side effects and non-pharmacologic comfort measures Outcome: Adequate for Discharge   Problem: Health Behavior/Discharge Planning: Goal:  Ability to manage health-related needs will improve Outcome: Adequate for Discharge   Problem: Clinical Measurements: Goal: Ability to maintain clinical measurements within normal limits will improve Outcome: Adequate for Discharge Goal: Will remain free from infection Outcome: Adequate for Discharge Goal: Diagnostic test results will improve Outcome: Adequate for Discharge Goal: Respiratory complications will improve Outcome: Adequate for Discharge Goal: Cardiovascular complication will be avoided Outcome: Adequate for Discharge   Problem: Activity: Goal: Risk for activity intolerance will decrease Outcome: Adequate for Discharge   Problem: Nutrition: Goal: Adequate nutrition will be maintained Outcome: Adequate for Discharge   Problem: Coping: Goal: Level of anxiety will decrease Outcome: Adequate for Discharge   Problem: Elimination: Goal: Will not experience complications related to bowel motility Outcome: Adequate for Discharge Goal: Will not experience complications related to urinary retention Outcome: Adequate for Discharge   Problem: Pain Managment: Goal: General experience of comfort will improve Outcome: Adequate for Discharge   Problem: Safety: Goal: Ability to remain free from injury will improve Outcome: Adequate for Discharge   Problem: Skin Integrity: Goal: Risk for impaired skin integrity will decrease Outcome: Adequate for Discharge   Problem: Education: Goal: Verbalization of understanding the information provided (i.e., activity precautions, restrictions, etc) will improve Outcome: Adequate for Discharge   Problem: Activity: Goal: Ability to ambulate and perform ADLs will improve Outcome: Adequate for Discharge   Problem: Clinical Measurements: Goal: Postoperative complications will be avoided or minimized Outcome: Adequate for Discharge   Problem: Self-Concept: Goal: Ability to maintain and perform role responsibilities to the fullest  extent possible will improve Outcome: Adequate for Discharge   Problem: Pain Management: Goal: Pain level will decrease Outcome: Adequate for Discharge   Problem: Education: Goal: Knowledge of the prescribed therapeutic regimen will improve Outcome: Adequate for Discharge Goal: Understanding of discharge needs will improve Outcome: Adequate for Discharge Goal: Individualized Educational Video(s) Outcome: Adequate for Discharge   Problem: Activity: Goal: Ability to avoid complications of  mobility impairment will improve Outcome: Adequate for Discharge Goal: Ability to tolerate increased activity will improve Outcome: Adequate for Discharge   Problem: Clinical Measurements: Goal: Postoperative complications will be avoided or minimized Outcome: Adequate for Discharge   Problem: Pain Management: Goal: Pain level will decrease with appropriate interventions Outcome: Adequate for Discharge   Problem: Skin Integrity: Goal: Will show signs of wound healing Outcome: Adequate for Discharge

## 2022-07-27 ENCOUNTER — Emergency Department (HOSPITAL_COMMUNITY): Payer: Medicare HMO

## 2022-07-27 ENCOUNTER — Emergency Department (HOSPITAL_COMMUNITY)
Admission: EM | Admit: 2022-07-27 | Discharge: 2022-07-27 | Disposition: A | Discharge status: Home / Self Care | Payer: Medicare HMO | Attending: Student | Admitting: Student

## 2022-07-27 ENCOUNTER — Encounter (HOSPITAL_COMMUNITY): Payer: Self-pay

## 2022-07-27 ENCOUNTER — Other Ambulatory Visit: Payer: Self-pay

## 2022-07-27 DIAGNOSIS — S79912A Unspecified injury of left hip, initial encounter: Secondary | ICD-10-CM | POA: Diagnosis present

## 2022-07-27 DIAGNOSIS — E119 Type 2 diabetes mellitus without complications: Secondary | ICD-10-CM | POA: Diagnosis not present

## 2022-07-27 DIAGNOSIS — Z7901 Long term (current) use of anticoagulants: Secondary | ICD-10-CM | POA: Diagnosis not present

## 2022-07-27 DIAGNOSIS — S73005A Unspecified dislocation of left hip, initial encounter: Secondary | ICD-10-CM

## 2022-07-27 DIAGNOSIS — I110 Hypertensive heart disease with heart failure: Secondary | ICD-10-CM | POA: Insufficient documentation

## 2022-07-27 DIAGNOSIS — Y92002 Bathroom of unspecified non-institutional (private) residence single-family (private) house as the place of occurrence of the external cause: Secondary | ICD-10-CM | POA: Diagnosis not present

## 2022-07-27 DIAGNOSIS — Z7984 Long term (current) use of oral hypoglycemic drugs: Secondary | ICD-10-CM | POA: Diagnosis not present

## 2022-07-27 DIAGNOSIS — I509 Heart failure, unspecified: Secondary | ICD-10-CM | POA: Diagnosis not present

## 2022-07-27 DIAGNOSIS — W1830XA Fall on same level, unspecified, initial encounter: Secondary | ICD-10-CM | POA: Insufficient documentation

## 2022-07-27 DIAGNOSIS — Z96642 Presence of left artificial hip joint: Secondary | ICD-10-CM | POA: Diagnosis not present

## 2022-07-27 DIAGNOSIS — Z79899 Other long term (current) drug therapy: Secondary | ICD-10-CM | POA: Diagnosis not present

## 2022-07-27 DIAGNOSIS — T84021A Dislocation of internal left hip prosthesis, initial encounter: Secondary | ICD-10-CM | POA: Diagnosis not present

## 2022-07-27 MED ORDER — PROPOFOL 10 MG/ML IV BOLUS
INTRAVENOUS | Status: AC | PRN
  Administered 2022-07-27 (×2): 30 ug via INTRAVENOUS
  Administered 2022-07-27: 50 ug via INTRAVENOUS
  Administered 2022-07-27 (×2): 30 ug via INTRAVENOUS

## 2022-07-27 MED ORDER — MORPHINE SULFATE (PF) 4 MG/ML IV SOLN
4.0000 mg | Freq: Once | INTRAVENOUS | Status: AC
  Administered 2022-07-27: 4 mg via INTRAVENOUS
  Filled 2022-07-27: qty 1

## 2022-07-27 MED ORDER — HYDROMORPHONE HCL 1 MG/ML IJ SOLN
0.5000 mg | Freq: Once | INTRAMUSCULAR | Status: AC
  Administered 2022-07-27: 0.5 mg via INTRAVENOUS
  Filled 2022-07-27: qty 1

## 2022-07-27 MED ORDER — PROPOFOL 10 MG/ML IV BOLUS
1.0000 mg/kg | Freq: Once | INTRAVENOUS | Status: DC
  Filled 2022-07-27: qty 20

## 2022-07-27 MED ORDER — ONDANSETRON HCL 4 MG/2ML IJ SOLN
4.0000 mg | Freq: Once | INTRAMUSCULAR | Status: AC
  Administered 2022-07-27: 4 mg via INTRAVENOUS
  Filled 2022-07-27: qty 2

## 2022-07-27 MED ORDER — PHENYLEPHRINE HCL (PRESSORS) 10 MG/ML IV SOLN
INTRAVENOUS | Status: AC | PRN
  Administered 2022-07-27: 80 ug via INTRAVENOUS

## 2022-07-27 MED ORDER — PHENYLEPHRINE 80 MCG/ML (10ML) SYRINGE FOR IV PUSH (FOR BLOOD PRESSURE SUPPORT)
80.0000 ug | PREFILLED_SYRINGE | Freq: Once | INTRAVENOUS | Status: DC
  Filled 2022-07-27: qty 10

## 2022-07-27 NOTE — Discharge Instructions (Signed)
You were seen here today for your hip pain after fall.  Your hip was dislocated but was successfully reduced in the emergency department.  Please keep the knee immobilizer in place and follow-up with Dr. Lyla Glassing soon as possible next week.  Call his office listed below today. Use the previously prescribed pain medication at your facility as needed.  Do not bear any weight on your left leg.  Return to the ER with any severe symptoms.

## 2022-07-27 NOTE — ED Provider Notes (Signed)
[facility] DEPT Provider Note   CSN: [id number] Arrival date & time: 07/27/22  0257     History  Chief Complaint  Patient presents with   Hip Pain    Lonnel Gjerde. is a 71 y.o. male who 1 week status post total hip arthroplasty of the left hip following closed fracture..  States he was at rehab today and was in the bathroom, standing at the sink and became unsteady and fell accusing to fall backwards, grabbed onto the sink but did not prevent himself from falling.  States he fell firmly onto his buttocks on the bathroom floor and has been having pain in the left hip since that time.  Denies hitting his head or his back.  I personally reviewed his medical records.  History of type 2 diabetes, CHF, melanoma of the right elbow, and syncope.  He is anticoagulated on Eliquis. HPI     Home Medications Prior to Admission medications   Medication Sig Start Date End Date Taking? Authorizing Provider  apixaban (ELIQUIS) 2.5 MG TABS tablet Take 1 tablet (2.5 mg total) by mouth 2 (two) times daily. 07/23/22   Charlott Rakes, PA-C  Ensure Max Protein (ENSURE MAX PROTEIN) LIQD Take 330 mLs (11 oz total) by mouth 2 (two) times daily. 07/25/22   Eugenie Filler, MD  fludrocortisone (FLORINEF) 0.1 MG tablet Take 2 tablets (0.2 mg total) by mouth daily. 03/05/22   Lavina Hamman, MD  glimepiride (AMARYL) 2 MG tablet Take 2 mg by mouth daily.  01/26/19   [provider]  HYDROcodone-acetaminophen (NORCO/VICODIN) 5-325 MG tablet Take 1 tablet by mouth every 4 (four) hours as needed for up to 7 days for moderate pain. 07/23/22 07/30/22  Charlott Rakes, PA-C  JARDIANCE 10 MG TABS tablet Take 10 mg by mouth daily. 07/05/22   [provider]  metoprolol tartrate (LOPRESSOR) 25 MG tablet Take 0.5 tablets (12.5 mg total) by mouth 2 (two) times daily. 07/25/22   Eugenie Filler, MD  midodrine (PROAMATINE) 5 MG tablet Take 0.5 tablets (2.5 mg total) by  mouth 3 (three) times daily with meals. 07/25/22   Eugenie Filler, MD  polyethylene glycol (MIRALAX / GLYCOLAX) 17 g packet Take 17 g by mouth daily. Patient not taking: Reported on 07/19/2022 03/05/22   Lavina Hamman, MD  rosuvastatin (CRESTOR) 5 MG tablet Take 5 mg by mouth See admin instructions. Take one tablet by mouth on Tuesday & Saturdays 03/26/20   [provider]  senna (SENOKOT) 8.6 MG TABS tablet Take 1 tablet (8.6 mg total) by mouth 2 (two) times daily. 07/25/22   Eugenie Filler, MD      Allergies    Patient has no known allergies.    Review of Systems   Review of Systems  Musculoskeletal:        Left hip pain    Physical Exam Updated Vital Signs BP (!) 161/77   Pulse 99   Temp 98 F (36.7 C) (Oral)   Resp 15   SpO2 98%  Physical Exam Vitals and nursing note reviewed.  Constitutional:      Appearance: He is not ill-appearing or toxic-appearing.  HENT:     Head: Normocephalic and atraumatic.     Mouth/Throat:     Mouth: Mucous membranes are moist.     Pharynx: No oropharyngeal exudate or posterior oropharyngeal erythema.  Eyes:     General:        Right  eye: No discharge.        Left eye: No discharge.     Conjunctiva/sclera: Conjunctivae normal.  Cardiovascular:     Rate and Rhythm: Normal rate and regular rhythm.     Pulses: Normal pulses.     Heart sounds: Normal heart sounds. No murmur heard. Pulmonary:     Effort: Pulmonary effort is normal. No respiratory distress.     Breath sounds: Normal breath sounds. No wheezing or rales.  Abdominal:     General: Bowel sounds are normal. There is no distension.     Palpations: Abdomen is soft.     Tenderness: There is no abdominal tenderness. There is no guarding or rebound.  Musculoskeletal:        General: No deformity.     Cervical back: Neck supple.     Right hip: Normal.     Left hip: Tenderness and bony tenderness present. No crepitus. Decreased range of motion.     Right upper leg:  Normal.     Left upper leg: Tenderness present. No deformity, lacerations or bony tenderness.     Right knee: Normal.     Left knee: Normal.     Right lower leg: Normal. No edema.     Left lower leg: Normal. No edema.     Right ankle: Normal.     Left ankle: Normal.     Comments: Left leg shorter than right, internally rotated  Skin:    General: Skin is warm and dry.     Capillary Refill: Capillary refill takes less than 2 seconds.  Neurological:     General: No focal deficit present.     Mental Status: He is alert and oriented to person, place, and time. Mental status is at baseline.  Psychiatric:        Mood and Affect: Mood normal.     ED Results / Procedures / Treatments   Labs (all labs ordered are listed, but only abnormal results are displayed) Labs Reviewed - No data to display  EKG None  Radiology DG Pelvis Portable  Result Date: 07/27/2022 CLINICAL DATA:  Status post reduction of left hip. EXAM: PORTABLE PELVIS 1-2 VIEWS COMPARISON:  Left hip radiographs 07/27/2022 at 4:53 a.m. FINDINGS: Left hip is reduced on this single view. No acute fractures are present. Hardware is intact. Right hip ORIF stable. IMPRESSION: Interval reduction of left hip dislocation. Electronically Signed   By: San Morelle M.D.   On: 07/27/2022 06:54   DG Femur Min 2 Views Left  Result Date: 07/27/2022 CLINICAL DATA:  Recent left hip replacement.  Fall. EXAM: LEFT FEMUR 2 VIEWS COMPARISON:  07/20/2022 FINDINGS: Study reviewed in the context of the left hip exam performed at the same time. Superior dislocation of the femoral component of the left hip prosthesis again noted. No evidence for periprosthetic or distal femur fracture. Gas in the soft tissues of the lateral thigh is presumably residual of surgery. IMPRESSION: Superior dislocation of the femoral component of the left hip prosthesis. No associated acute fracture. Electronically Signed   By: Misty Stanley M.D.   On: 07/27/2022  05:32   DG Hip Unilat W or Wo Pelvis 2-3 Views Left  Result Date: 07/27/2022 CLINICAL DATA:  Fall.  Recent hip replacement. EXAM: DG HIP (WITH OR WITHOUT PELVIS) 2-3V LEFT COMPARISON:  07/20/2022 FINDINGS: In the interval since the prior study there has been superior dislocation of the left femoral component of a left total hip replacement. No evidence for periprosthetic  femur fracture. Patient is status post ORIF for right femoral neck fracture. Bones are diffusely demineralized. IMPRESSION: Superior dislocation of the femoral component of the left hip prosthesis. No evidence for an associated periprosthetic femur fracture. Electronically Signed   By: Misty Stanley M.D.   On: 07/27/2022 05:30    Procedures Reduction of dislocation  Date/Time: 07/27/2022 6:49 AM  Performed by: Emeline Darling, PA-C Authorized by: Emeline Darling, PA-C  Consent: Verbal consent obtained. Written consent obtained. Risks and benefits: risks, benefits and alternatives were discussed Consent given by: patient Patient understanding: patient states understanding of the procedure being performed Patient consent: the patient's understanding of the procedure matches consent given Procedure consent: procedure consent matches procedure scheduled Relevant documents: relevant documents present and verified Imaging studies: imaging studies available Patient identity confirmed: verbally with patient and arm band Time out: Immediately prior to procedure a "time out" was called to verify the correct patient, procedure, equipment, support staff and site/side marked as required. Preparation: Patient was prepped and draped in the usual sterile fashion. Local anesthesia used: no  Anesthesia: Local anesthesia used: no  Sedation: Patient sedated: yes Sedation type: moderate (conscious) sedation Sedatives: propofol Analgesia: hydromorphone  Patient tolerance: patient tolerated the procedure well with no  immediate complications Comments: Successful reduction of superior dislocation of femoral component of left hip prosthesis       Medications Ordered in ED Medications  PHENYLephrine 80 mcg/ml in normal saline Adult IV Push Syringe (For Blood Pressure Support) ( Intravenous Not Given 07/27/22 0644)  propofol (DIPRIVAN) 10 mg/mL bolus/IV push 88.5 mg (88.5 mg Intravenous Not Given 07/27/22 0641)  morphine (PF) 4 MG/ML injection 4 mg (4 mg Intravenous Given 07/27/22 0441)  ondansetron (ZOFRAN) injection 4 mg (4 mg Intravenous Given 07/27/22 0442)  HYDROmorphone (DILAUDID) injection 0.5 mg (0.5 mg Intravenous Given 07/27/22 0620)  propofol (DIPRIVAN) 10 mg/mL bolus/IV push (30 mcg Intravenous Given 07/27/22 0637)  phenylephrine (NEO-SYNEPHRINE) injection (80 mcg Intravenous Given 07/27/22 9924)    ED Course/ Medical Decision Making/ A&P Clinical Course as of 07/27/22 2683  Fri Jul 27, 2022  0602 Consult to Dr. Kathaleen Bury, orthopedist, who visualized the xrays and states it is appropriate for ED providers to attempt reduction at this time. I appreciate his collaboration in the care of this patient.  [RS]    Clinical Course User Index [RS] Termaine Roupp, Gypsy Balsam, PA-C                           Medical Decision Making 71 year old male 1 week status post total hip arthroplasty for left hip fracture.  Hypertensive on intake, vital signs otherwise normal.  Cardiopulmonary sounds normal, abdominal exam is benign.  Patient neurovascular tact in all extremities. TTPof the left hip, obvious deformity of the joint. Wound is well-healing, staples in place. No erythema, induration, or purulence.   Amount and/or Complexity of Data Reviewed Radiology: ordered.    Details: All images visualized this provider.  Successful reduction of the superior left prosthesis dislocation of the hip.  Risk Prescription drug management.   Hip successfully reduced in the ED.  Consult to Ortho as above, also  consulted with patient's primary surgeon Dr. Lyla Glassing who stated we should place patient in a sling any the lesser on the affected leg and he would see the patient in the office early next week.  Previous collaboration of care with patient.  Kaelen  voiced understanding of his medical evaluation and treatment plan.  Each of their questions answered to their expressed satisfaction.  Return precautions were given.  Patient is well-appearing, stable, and was discharged in good condition.  This chart was dictated using voice recognition software, Dragon. Despite the best efforts of this provider to proofread and correct errors, errors may still occur which can change documentation meaning.   Final Clinical Impression(s) / ED Diagnoses Final diagnoses:  Dislocation of left hip, initial encounter [facility])    Rx / DC Orders ED Discharge Orders     None         Aura Dials 07/27/22 0726    Teressa Lower, MD 07/28/22 1825

## 2022-07-27 NOTE — ED Notes (Signed)
[facility] called for transport.  

## 2022-07-27 NOTE — ED Notes (Signed)
Report given to [place] at [facility].

## 2022-07-27 NOTE — ED Notes (Addendum)
Report attempted to [facility] with no answer.

## 2022-07-27 NOTE — ED Triage Notes (Signed)
Pt was at [facility] for rehab post left hip replacement.  Pt was at sink tonight and lost his balance and fell on his buttocks.  Now the left hip is hurting and the leg is rotated laterally.

## 2022-07-27 NOTE — ED Notes (Signed)
Report attempted to [facility] x2 with no answer.

## 2022-07-27 NOTE — ED Provider Notes (Signed)
.  Sedation  Date/Time: 07/27/2022 7:14 AM  Performed by: Teressa Lower, MD Authorized by: Teressa Lower, MD   Consent:    Consent obtained:  Written   Consent given by:  Patient   Risks discussed:  Allergic reaction, prolonged hypoxia resulting in organ damage, dysrhythmia, prolonged sedation necessitating reversal, nausea, inadequate sedation, vomiting and respiratory compromise necessitating ventilatory assistance and intubation   Alternatives discussed:  Analgesia without sedation and regional anesthesia Universal protocol:    Immediately prior to procedure, a time out was called: yes   Pre-sedation assessment:    Time since last food or drink:  2300   ASA classification: class 1 - normal, healthy patient     Mallampati score:  I - soft palate, uvula, fauces, pillars visible   Pre-sedation assessments completed and reviewed: airway patency, cardiovascular function, hydration status, mental status, nausea/vomiting, pain level and respiratory function   Procedure details (see MAR for exact dosages):    Preoxygenation:  Nasal cannula   Sedation:  Propofol   Intended level of sedation: deep   Intra-procedure monitoring:  Blood pressure monitoring, continuous capnometry, frequent LOC assessments, cardiac monitor and continuous pulse oximetry   Intra-procedure events: none     Intra-procedure management:  Fluid bolus   Total Provider sedation time (minutes):  30 Post-procedure details:    Post-sedation assessments completed and reviewed: airway patency, cardiovascular function, mental status, nausea/vomiting and respiratory function     Patient is stable for discharge or admission: no     Procedure completion:  Tolerated well, no immediate complications .Ortho Injury Treatment  Date/Time: 07/27/2022 7:15 AM  Performed by: Teressa Lower, MD Authorized by: Teressa Lower, MD   Consent:    Consent obtained:  Written   Consent given by:  Patient   Risks discussed:  Fracture,  nerve damage, restricted joint movement, vascular damage, stiffness, recurrent dislocation and irreducible dislocation   Alternatives discussed:  No treatment, alternative treatment and immobilizationInjury location: hip Location details: left hip Injury type: dislocation Dislocation type: anterior Pre-procedure distal perfusion: normal Pre-procedure neurological function: normal Pre-procedure range of motion: reduced  Anesthesia: Local anesthesia used: no  Patient sedated: Yes. Refer to sedation procedure documentation for details of sedation. Manipulation performed: yes Reduction method: captian morgan. Reduction successful: yes X-ray confirmed reduction: yes Immobilization: splint Splint type: knee immobilizer. Splint Applied by: ED Nurse Post-procedure distal perfusion: normal Post-procedure neurological function: normal Post-procedure range of motion: improved       Teressa Lower, MD 07/27/22 [phone]

## 2022-07-30 DIAGNOSIS — I951 Orthostatic hypotension: Secondary | ICD-10-CM | POA: Diagnosis not present

## 2022-07-30 DIAGNOSIS — I152 Hypertension secondary to endocrine disorders: Secondary | ICD-10-CM | POA: Diagnosis not present

## 2022-07-30 DIAGNOSIS — S72002D Fracture of unspecified part of neck of left femur, subsequent encounter for closed fracture with routine healing: Secondary | ICD-10-CM | POA: Diagnosis not present

## 2022-07-30 DIAGNOSIS — E2740 Unspecified adrenocortical insufficiency: Secondary | ICD-10-CM | POA: Diagnosis not present

## 2022-07-30 DIAGNOSIS — D62 Acute posthemorrhagic anemia: Secondary | ICD-10-CM | POA: Diagnosis not present

## 2022-07-30 DIAGNOSIS — E785 Hyperlipidemia, unspecified: Secondary | ICD-10-CM | POA: Diagnosis not present

## 2022-07-30 DIAGNOSIS — M8580 Other specified disorders of bone density and structure, unspecified site: Secondary | ICD-10-CM | POA: Diagnosis not present

## 2022-07-30 DIAGNOSIS — D6859 Other primary thrombophilia: Secondary | ICD-10-CM | POA: Diagnosis not present

## 2022-07-30 DIAGNOSIS — E118 Type 2 diabetes mellitus with unspecified complications: Secondary | ICD-10-CM | POA: Diagnosis not present

## 2022-08-03 DIAGNOSIS — Z471 Aftercare following joint replacement surgery: Secondary | ICD-10-CM | POA: Diagnosis not present

## 2022-08-03 DIAGNOSIS — S72002A Fracture of unspecified part of neck of left femur, initial encounter for closed fracture: Secondary | ICD-10-CM | POA: Diagnosis not present

## 2022-08-06 DIAGNOSIS — M25473 Effusion, unspecified ankle: Secondary | ICD-10-CM | POA: Diagnosis not present

## 2022-08-06 DIAGNOSIS — E1159 Type 2 diabetes mellitus with other circulatory complications: Secondary | ICD-10-CM | POA: Diagnosis not present

## 2022-08-06 DIAGNOSIS — S72002D Fracture of unspecified part of neck of left femur, subsequent encounter for closed fracture with routine healing: Secondary | ICD-10-CM | POA: Diagnosis not present

## 2022-08-06 DIAGNOSIS — Z7901 Long term (current) use of anticoagulants: Secondary | ICD-10-CM | POA: Diagnosis not present

## 2022-08-08 DIAGNOSIS — E118 Type 2 diabetes mellitus with unspecified complications: Secondary | ICD-10-CM | POA: Diagnosis not present

## 2022-08-08 DIAGNOSIS — I152 Hypertension secondary to endocrine disorders: Secondary | ICD-10-CM | POA: Diagnosis not present

## 2022-08-08 DIAGNOSIS — R262 Difficulty in walking, not elsewhere classified: Secondary | ICD-10-CM | POA: Diagnosis not present

## 2022-08-08 DIAGNOSIS — D6859 Other primary thrombophilia: Secondary | ICD-10-CM | POA: Diagnosis not present

## 2022-08-08 DIAGNOSIS — E2740 Unspecified adrenocortical insufficiency: Secondary | ICD-10-CM | POA: Diagnosis not present

## 2022-08-08 DIAGNOSIS — I951 Orthostatic hypotension: Secondary | ICD-10-CM | POA: Diagnosis not present

## 2022-08-08 DIAGNOSIS — R059 Cough, unspecified: Secondary | ICD-10-CM | POA: Diagnosis not present

## 2022-08-08 DIAGNOSIS — S72002A Fracture of unspecified part of neck of left femur, initial encounter for closed fracture: Secondary | ICD-10-CM | POA: Diagnosis not present

## 2022-08-08 DIAGNOSIS — L89896 Pressure-induced deep tissue damage of other site: Secondary | ICD-10-CM | POA: Diagnosis not present

## 2022-08-15 DIAGNOSIS — Z4789 Encounter for other orthopedic aftercare: Secondary | ICD-10-CM | POA: Diagnosis not present

## 2022-08-23 ENCOUNTER — Inpatient Hospital Stay (HOSPITAL_COMMUNITY)
Admission: EM | Admit: 2022-08-23 | Discharge: 2022-08-28 | DRG: 467 | Disposition: A | Discharge status: Home Health | Payer: Medicare HMO | Attending: Internal Medicine | Admitting: Internal Medicine

## 2022-08-23 ENCOUNTER — Emergency Department (HOSPITAL_COMMUNITY): Payer: Medicare HMO

## 2022-08-23 DIAGNOSIS — Z471 Aftercare following joint replacement surgery: Secondary | ICD-10-CM | POA: Diagnosis not present

## 2022-08-23 DIAGNOSIS — E871 Hypo-osmolality and hyponatremia: Secondary | ICD-10-CM | POA: Diagnosis not present

## 2022-08-23 DIAGNOSIS — L249 Irritant contact dermatitis, unspecified cause: Secondary | ICD-10-CM | POA: Diagnosis present

## 2022-08-23 DIAGNOSIS — E1165 Type 2 diabetes mellitus with hyperglycemia: Secondary | ICD-10-CM | POA: Diagnosis present

## 2022-08-23 DIAGNOSIS — F17210 Nicotine dependence, cigarettes, uncomplicated: Secondary | ICD-10-CM | POA: Diagnosis present

## 2022-08-23 DIAGNOSIS — I509 Heart failure, unspecified: Secondary | ICD-10-CM | POA: Diagnosis not present

## 2022-08-23 DIAGNOSIS — R531 Weakness: Secondary | ICD-10-CM | POA: Diagnosis not present

## 2022-08-23 DIAGNOSIS — E119 Type 2 diabetes mellitus without complications: Secondary | ICD-10-CM | POA: Diagnosis not present

## 2022-08-23 DIAGNOSIS — E1169 Type 2 diabetes mellitus with other specified complication: Secondary | ICD-10-CM | POA: Diagnosis present

## 2022-08-23 DIAGNOSIS — I110 Hypertensive heart disease with heart failure: Secondary | ICD-10-CM | POA: Diagnosis present

## 2022-08-23 DIAGNOSIS — R296 Repeated falls: Secondary | ICD-10-CM | POA: Diagnosis present

## 2022-08-23 DIAGNOSIS — Z833 Family history of diabetes mellitus: Secondary | ICD-10-CM | POA: Diagnosis not present

## 2022-08-23 DIAGNOSIS — L304 Erythema intertrigo: Secondary | ICD-10-CM | POA: Diagnosis present

## 2022-08-23 DIAGNOSIS — S73035A Other anterior dislocation of left hip, initial encounter: Secondary | ICD-10-CM | POA: Diagnosis not present

## 2022-08-23 DIAGNOSIS — I5032 Chronic diastolic (congestive) heart failure: Secondary | ICD-10-CM | POA: Diagnosis present

## 2022-08-23 DIAGNOSIS — E7800 Pure hypercholesterolemia, unspecified: Secondary | ICD-10-CM | POA: Diagnosis not present

## 2022-08-23 DIAGNOSIS — E11628 Type 2 diabetes mellitus with other skin complications: Secondary | ICD-10-CM

## 2022-08-23 DIAGNOSIS — Z86718 Personal history of other venous thrombosis and embolism: Secondary | ICD-10-CM | POA: Diagnosis not present

## 2022-08-23 DIAGNOSIS — Z7984 Long term (current) use of oral hypoglycemic drugs: Secondary | ICD-10-CM | POA: Diagnosis not present

## 2022-08-23 DIAGNOSIS — M25559 Pain in unspecified hip: Secondary | ICD-10-CM | POA: Diagnosis not present

## 2022-08-23 DIAGNOSIS — I951 Orthostatic hypotension: Secondary | ICD-10-CM | POA: Diagnosis present

## 2022-08-23 DIAGNOSIS — Z96642 Presence of left artificial hip joint: Secondary | ICD-10-CM | POA: Diagnosis not present

## 2022-08-23 DIAGNOSIS — Z7901 Long term (current) use of anticoagulants: Secondary | ICD-10-CM | POA: Diagnosis not present

## 2022-08-23 DIAGNOSIS — Z85820 Personal history of malignant melanoma of skin: Secondary | ICD-10-CM | POA: Diagnosis not present

## 2022-08-23 DIAGNOSIS — Z89412 Acquired absence of left great toe: Secondary | ICD-10-CM | POA: Diagnosis not present

## 2022-08-23 DIAGNOSIS — S73005A Unspecified dislocation of left hip, initial encounter: Secondary | ICD-10-CM | POA: Diagnosis present

## 2022-08-23 DIAGNOSIS — R739 Hyperglycemia, unspecified: Secondary | ICD-10-CM

## 2022-08-23 DIAGNOSIS — D62 Acute posthemorrhagic anemia: Secondary | ICD-10-CM | POA: Diagnosis not present

## 2022-08-23 DIAGNOSIS — Z7401 Bed confinement status: Secondary | ICD-10-CM | POA: Diagnosis not present

## 2022-08-23 DIAGNOSIS — T84021A Dislocation of internal left hip prosthesis, initial encounter: Secondary | ICD-10-CM | POA: Diagnosis present

## 2022-08-23 DIAGNOSIS — I10 Essential (primary) hypertension: Secondary | ICD-10-CM | POA: Diagnosis not present

## 2022-08-23 DIAGNOSIS — Z79899 Other long term (current) drug therapy: Secondary | ICD-10-CM | POA: Diagnosis not present

## 2022-08-23 DIAGNOSIS — E785 Hyperlipidemia, unspecified: Secondary | ICD-10-CM | POA: Diagnosis present

## 2022-08-23 DIAGNOSIS — D649 Anemia, unspecified: Secondary | ICD-10-CM | POA: Diagnosis not present

## 2022-08-23 DIAGNOSIS — Z823 Family history of stroke: Secondary | ICD-10-CM | POA: Diagnosis not present

## 2022-08-23 DIAGNOSIS — L03119 Cellulitis of unspecified part of limb: Secondary | ICD-10-CM

## 2022-08-23 DIAGNOSIS — Y831 Surgical operation with implant of artificial internal device as the cause of abnormal reaction of the patient, or of later complication, without mention of misadventure at the time of the procedure: Secondary | ICD-10-CM | POA: Diagnosis present

## 2022-08-23 DIAGNOSIS — F17200 Nicotine dependence, unspecified, uncomplicated: Secondary | ICD-10-CM | POA: Diagnosis not present

## 2022-08-23 DIAGNOSIS — M25552 Pain in left hip: Secondary | ICD-10-CM | POA: Diagnosis not present

## 2022-08-23 LAB — BASIC METABOLIC PANEL
Anion gap: 11 (ref 5–15)
BUN: 16 mg/dL (ref 8–23)
CO2: 25 mmol/L (ref 22–32)
Calcium: 8.8 mg/dL — ABNORMAL LOW (ref 8.9–10.3)
Chloride: 102 mmol/L (ref 98–111)
Creatinine, Ser: 0.75 mg/dL (ref 0.61–1.24)
GFR, Estimated: >60 mL/min (ref >60)
Glucose, Bld: 420 mg/dL — ABNORMAL HIGH (ref 70–99)
Potassium: 3.7 mmol/L (ref 3.5–5.1)
Sodium: 138 mmol/L (ref 135–145)

## 2022-08-23 LAB — CBC WITH DIFFERENTIAL/PLATELET
Abs Immature Granulocytes: 0.03 10*3/uL (ref 0.00–0.07)
Basophils Absolute: 0 10*3/uL (ref 0.0–0.1)
Basophils Relative: 1 %
Eosinophils Absolute: 0.2 10*3/uL (ref 0.0–0.5)
Eosinophils Relative: 3 %
HCT: 34.5 % — ABNORMAL LOW (ref 39.0–52.0)
Hemoglobin: 10.8 g/dL — ABNORMAL LOW (ref 13.0–17.0)
Immature Granulocytes: 0 %
Lymphocytes Relative: 21 %
Lymphs Abs: 1.6 10*3/uL (ref 0.7–4.0)
MCH: 30.3 pg (ref 26.0–34.0)
MCHC: 31.3 g/dL (ref 30.0–36.0)
MCV: 96.9 fL (ref 80.0–100.0)
Monocytes Absolute: 0.4 10*3/uL (ref 0.1–1.0)
Monocytes Relative: 5 %
Neutro Abs: 5.4 10*3/uL (ref 1.7–7.7)
Neutrophils Relative %: 70 %
Platelets: 195 10*3/uL (ref 150–400)
RBC: 3.56 MIL/uL — ABNORMAL LOW (ref 4.22–5.81)
RDW: 14.2 % (ref 11.5–15.5)
WBC: 7.6 10*3/uL (ref 4.0–10.5)
nRBC: 0 % (ref 0.0–0.2)

## 2022-08-23 LAB — CBG MONITORING, ED: Glucose-Capillary: 353 mg/dL — ABNORMAL HIGH (ref 70–99)

## 2022-08-23 LAB — GLUCOSE, CAPILLARY: Glucose-Capillary: 292 mg/dL — ABNORMAL HIGH (ref 70–99)

## 2022-08-23 MED ORDER — CEFAZOLIN SODIUM-DEXTROSE 2-4 GM/100ML-% IV SOLN: 2.0000 g | INTRAVENOUS | Status: DC

## 2022-08-23 MED ORDER — ENOXAPARIN SODIUM 40 MG/0.4ML IJ SOSY: 40.0000 mg | PREFILLED_SYRINGE | Freq: Once | INTRAMUSCULAR | Status: DC

## 2022-08-23 MED ORDER — ASPIRIN 325 MG PO TBEC: 325.0000 mg | DELAYED_RELEASE_TABLET | Freq: Every day | ORAL | Status: DC

## 2022-08-23 MED ORDER — POVIDONE-IODINE 10 % EX SWAB: 2.0000 | Freq: Once | CUTANEOUS | Status: DC

## 2022-08-23 MED ORDER — INSULIN ASPART 100 UNIT/ML IJ SOLN
10.0000 [IU] | Freq: Once | INTRAMUSCULAR | Status: AC
  Administered 2022-08-23: 10 [IU] via SUBCUTANEOUS
  Filled 2022-08-23: qty 0.1

## 2022-08-23 MED ORDER — INSULIN ASPART 100 UNIT/ML IJ SOLN
0.0000 [IU] | Freq: Every day | INTRAMUSCULAR | Status: DC
  Administered 2022-08-23: 3 [IU] via SUBCUTANEOUS
  Administered 2022-08-24 – 2022-08-25 (×2): 2 [IU] via SUBCUTANEOUS
  Administered 2022-08-26: 4 [IU] via SUBCUTANEOUS
  Administered 2022-08-27: 3 [IU] via SUBCUTANEOUS
  Filled 2022-08-23: qty 0.05

## 2022-08-23 MED ORDER — TRANEXAMIC ACID-NACL 1000-0.7 MG/100ML-% IV SOLN
1000.0000 mg | INTRAVENOUS | Status: AC
  Administered 2022-08-24: 1000 mg via INTRAVENOUS
  Filled 2022-08-23: qty 100

## 2022-08-23 MED ORDER — PROPOFOL 10 MG/ML IV BOLUS
0.5000 mg/kg | Freq: Once | INTRAVENOUS | Status: AC
  Administered 2022-08-23: 140 mg via INTRAVENOUS
  Filled 2022-08-23: qty 20

## 2022-08-23 MED ORDER — VANCOMYCIN HCL IN DEXTROSE 1-5 GM/200ML-% IV SOLN
1000.0000 mg | INTRAVENOUS | Status: AC
  Administered 2022-08-24: 1000 mg via INTRAVENOUS
  Filled 2022-08-23: qty 200

## 2022-08-23 MED ORDER — ACETAMINOPHEN 500 MG PO TABS
1000.0000 mg | ORAL_TABLET | Freq: Once | ORAL | Status: AC
  Administered 2022-08-24: 1000 mg via ORAL
  Filled 2022-08-23: qty 2

## 2022-08-23 MED ORDER — FLUDROCORTISONE ACETATE 0.1 MG PO TABS
0.2000 mg | ORAL_TABLET | Freq: Every day | ORAL | Status: DC
  Administered 2022-08-23 – 2022-08-28 (×5): 0.2 mg via ORAL
  Filled 2022-08-23 (×6): qty 2

## 2022-08-23 MED ORDER — INSULIN ASPART 100 UNIT/ML IJ SOLN
0.0000 [IU] | Freq: Three times a day (TID) | INTRAMUSCULAR | Status: DC
  Administered 2022-08-24: 8 [IU] via SUBCUTANEOUS
  Administered 2022-08-24: 5 [IU] via SUBCUTANEOUS
  Administered 2022-08-24: 3 [IU] via SUBCUTANEOUS
  Administered 2022-08-25 (×2): 11 [IU] via SUBCUTANEOUS
  Administered 2022-08-25 – 2022-08-26 (×3): 5 [IU] via SUBCUTANEOUS
  Administered 2022-08-26 – 2022-08-27 (×2): 8 [IU] via SUBCUTANEOUS
  Administered 2022-08-27: 5 [IU] via SUBCUTANEOUS
  Administered 2022-08-27 – 2022-08-28 (×2): 8 [IU] via SUBCUTANEOUS
  Administered 2022-08-28: 11 [IU] via SUBCUTANEOUS
  Filled 2022-08-23: qty 0.15

## 2022-08-23 MED ORDER — ACETAMINOPHEN 325 MG PO TABS: 650.0000 mg | ORAL_TABLET | Freq: Four times a day (QID) | ORAL | Status: DC | PRN

## 2022-08-23 MED ORDER — OXYCODONE HCL 5 MG PO TABS: 5.0000 mg | ORAL_TABLET | ORAL | Status: DC | PRN

## 2022-08-23 MED ORDER — CHLORHEXIDINE GLUCONATE 4 % EX LIQD: 60.0000 mL | Freq: Once | CUTANEOUS | Status: DC

## 2022-08-23 MED ORDER — SODIUM CHLORIDE 0.9 % IV SOLN: INTRAVENOUS | Status: DC

## 2022-08-23 MED ORDER — ACETAMINOPHEN 650 MG RE SUPP: 650.0000 mg | Freq: Four times a day (QID) | RECTAL | Status: DC | PRN

## 2022-08-23 MED ORDER — MORPHINE SULFATE (PF) 4 MG/ML IV SOLN: 4.0000 mg | INTRAVENOUS | Status: DC | PRN

## 2022-08-23 MED ORDER — MIDODRINE HCL 2.5 MG PO TABS
2.5000 mg | ORAL_TABLET | Freq: Three times a day (TID) | ORAL | Status: DC
  Administered 2022-08-24 – 2022-08-28 (×12): 2.5 mg via ORAL
  Filled 2022-08-23 (×15): qty 1

## 2022-08-23 MED ORDER — POVIDONE-IODINE 10 % EX SWAB
2.0000 | Freq: Once | CUTANEOUS | Status: AC
  Administered 2022-08-24: 2 via TOPICAL

## 2022-08-23 NOTE — H&P (View-Only) (Signed)
ORTHOPAEDIC CONSULTATION  REQUESTING PHYSICIAN: [name], MD  PCP:  [name], MD  Chief Complaint: Left hip pain, inability to weight-bear  HPI: Micheal Moore. is a 71 y.o. male who is known to me for primary left total hip arthroplasty for displaced femoral neck fracture on 07/20/2022.  Postoperatively, he fell on the 10th, sustaining a periprosthetic dislocation of the left hip.  He underwent successful closed reduction by emergency department staff on that same day.  I saw him in the office on November 17 and his hip normal.  He presented to the ER today with inability to weight-bear on the left hip for several days.  He does not remember how long it has been since he walked.  He does not recall any falls or injuries.  Past Medical History:  Diagnosis Date   Cancer ([facility])    right elbow melanoma   Diabetes mellitus without complication ([facility])    Type II   Dizziness    DVT (deep venous thrombosis) ([facility])    right leg   Frequent falls    Near syncope    Syncope    Past Surgical History:  Procedure Laterality Date   AMPUTATION TOE Left 04/28/2020   Procedure: AMPUTATION LEFT GREAT TOE;  Surgeon: Newt Minion, MD;  Location: [facility];  Service: Orthopedics;  Laterality: Left;   INTRAMEDULLARY (IM) NAIL INTERTROCHANTERIC Right 02/23/2022   Procedure: INTRAMEDULLARY (IM) NAIL INTERTROCHANTRIC, RIGHT;  Surgeon: Rod Can, MD;  Location: [facility];  Service: Orthopedics;  Laterality: Right;   MELANOMA EXCISION WITH SENTINEL LYMPH NODE BIOPSY Right 08/19/2020   Procedure: WIDE LOCAL EXCISION RIGHT ELBOW MELANOMA WITH RIGHT AXILLARY SENTINEL LYMPH NODE BIOPSY;  Surgeon: Dwan Bolt, MD;  Location: [facility];  Service: General;  Laterality: Right;  2nd incision in axilla   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   Procedure: TOTAL HIP ARTHROPLASTY ANTERIOR APPROACH;  Surgeon: Rod Can, MD;  Location: [facility];  Service: Orthopedics;  Laterality: Left;   Social  History   Socioeconomic History   Marital status: Married    Spouse name: [name]   Number of children: 1   Years of education: Not on file   Highest education level: Associate degree: occupational, Hotel manager, or vocational program  Occupational History    Comment: retired  Tobacco Use   Smoking status: Every Day    Packs/day: 0.20    Years: 19.00    Total pack years: 3.80    Types: Cigarettes   Smokeless tobacco: Never  Vaping Use   Vaping Use: Never used  Substance and Sexual Activity   Alcohol use: No   Drug use: Not Currently   Sexual activity: Not on file  Other Topics Concern   Not on file  Social History Narrative   Lives with wife   Social Determinants of Health   Financial Resource Strain: Not on file  Food Insecurity: No Food Insecurity (07/19/2022)   Hunger Vital Sign    Worried About Running Out of Food in the Last Year: Never true    Ran Out of Food in the Last Year: Never true  Transportation Needs: No Transportation Needs (07/19/2022)   PRAPARE - Hydrologist (Medical): No    Lack of Transportation (Non-Medical): No  Physical Activity: Not on file  Stress: Not on file  Social Connections: Not on file   Family History  Problem Relation Age of Onset   Stroke Mother  Diabetes Father    Stroke Sister    Diabetes Sister    No Known Allergies Prior to Admission medications   Medication Sig Start Date End Date Taking? Authorizing Provider  apixaban (ELIQUIS) 2.5 MG TABS tablet Take 1 tablet (2.5 mg total) by mouth 2 (two) times daily. 07/23/22   Charlott Rakes, PA-C  Ensure Max Protein (ENSURE MAX PROTEIN) LIQD Take 330 mLs (11 oz total) by mouth 2 (two) times daily. 07/25/22   Eugenie Filler, MD  fludrocortisone (FLORINEF) 0.1 MG tablet Take 2 tablets (0.2 mg total) by mouth daily. 03/05/22   Lavina Hamman, MD  glimepiride (AMARYL) 2 MG tablet Take 2 mg by mouth daily.  01/26/19   [provider]  JARDIANCE 10 MG  TABS tablet Take 10 mg by mouth daily. 07/05/22   [provider]  metoprolol tartrate (LOPRESSOR) 25 MG tablet Take 0.5 tablets (12.5 mg total) by mouth 2 (two) times daily. 07/25/22   Eugenie Filler, MD  midodrine (PROAMATINE) 5 MG tablet Take 0.5 tablets (2.5 mg total) by mouth 3 (three) times daily with meals. 07/25/22   Eugenie Filler, MD  polyethylene glycol (MIRALAX / GLYCOLAX) 17 g packet Take 17 g by mouth daily. Patient not taking: Reported on 07/19/2022 03/05/22   Lavina Hamman, MD  rosuvastatin (CRESTOR) 5 MG tablet Take 5 mg by mouth See admin instructions. Take one tablet by mouth on Tuesday & Saturdays 03/26/20   [provider]  senna (SENOKOT) 8.6 MG TABS tablet Take 1 tablet (8.6 mg total) by mouth 2 (two) times daily. 07/25/22   Eugenie Filler, MD   DG Hip Unilat With Pelvis 2-3 Views Left  Result Date: 08/23/2022 CLINICAL DATA:  Left hip pain and dislocation. EXAM: DG HIP (WITH OR WITHOUT PELVIS) 2-3V LEFT COMPARISON:  July 27, 2022. FINDINGS: Status post left total hip arthroplasty. 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 No definite fracture is noted. 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 Electronically Signed   By: Marijo Conception M.D.   On: 08/23/2022 13:59    Positive ROS: All other systems have been reviewed and were otherwise negative with the exception of those mentioned in the HPI and as above.  Physical Exam: General: Alert, no acute distress Cardiovascular: No pedal edema Respiratory: No cyanosis, no use of accessory musculature GI: No organomegaly, abdomen is soft and non-tender Skin: No lesions in the area of chief complaint Neurologic: Sensation intact distally Psychiatric: Patient is competent for consent with normal mood and affect Lymphatic: No axillary or cervical lymphadenopathy  MUSCULOSKELETAL: Examination of the left hip reveals a healed anterior incision.  There is  deformity about the left hip.  He has shortened.  There is no focal motor or sensory deficit.  His foot is perfused.  Assessment: Subacute periprosthetic left hip dislocation, second occurrence.  Plan: I discussed the findings with the patient.  It is unclear how many days his hip has been dislocated.  I recommend trying a closed reduction in the emergency department.  After consent was obtained, conscious sedation was administered by the emergency department staff.  I attempted to reduce his hip without success.  Patient will require open reduction of the left hip, possible head and liner exchange in the operating room.  Plan for surgery tomorrow.  N.p.o. after midnight.  EDP plans to call [facility] for admission given his multiple medical problems.  Appreciate their assistance.  Hilton Cork  [place], MD [phone]    08/23/2022 3:54 PM

## 2022-08-23 NOTE — ED Provider Notes (Signed)
[facility] DEPT Provider Note   CSN: [id number] Arrival date & time: 08/23/22  1224     History  Chief Complaint  Patient presents with   Hip Pain    Micheal Moore. is a 71 y.o. male.  He is brought in by ambulance from home with acute left hip pain.  He thinks he may have dislocated it.  He was here in early November for hip dislocation.  He denies any recent trauma.  He has no medical complaints.  The history is provided by the patient.  Hip Pain This is a recurrent problem. The problem occurs constantly. The problem has not changed since onset.Pertinent negatives include no chest pain, no abdominal pain, no headaches and no shortness of breath. The symptoms are aggravated by bending. Nothing relieves the symptoms. He has tried nothing for the symptoms. The treatment provided no relief.       Home Medications Prior to Admission medications   Medication Sig Start Date End Date Taking? Authorizing Provider  apixaban (ELIQUIS) 2.5 MG TABS tablet Take 1 tablet (2.5 mg total) by mouth 2 (two) times daily. 07/23/22   Charlott Rakes, PA-C  Ensure Max Protein (ENSURE MAX PROTEIN) LIQD Take 330 mLs (11 oz total) by mouth 2 (two) times daily. 07/25/22   Eugenie Filler, MD  fludrocortisone (FLORINEF) 0.1 MG tablet Take 2 tablets (0.2 mg total) by mouth daily. 03/05/22   Lavina Hamman, MD  glimepiride (AMARYL) 2 MG tablet Take 2 mg by mouth daily.  01/26/19   [provider]  JARDIANCE 10 MG TABS tablet Take 10 mg by mouth daily. 07/05/22   [provider]  metoprolol tartrate (LOPRESSOR) 25 MG tablet Take 0.5 tablets (12.5 mg total) by mouth 2 (two) times daily. 07/25/22   Eugenie Filler, MD  midodrine (PROAMATINE) 5 MG tablet Take 0.5 tablets (2.5 mg total) by mouth 3 (three) times daily with meals. 07/25/22   Eugenie Filler, MD  polyethylene glycol (MIRALAX / GLYCOLAX) 17 g packet Take 17 g by mouth daily. Patient not  taking: Reported on 07/19/2022 03/05/22   Lavina Hamman, MD  rosuvastatin (CRESTOR) 5 MG tablet Take 5 mg by mouth See admin instructions. Take one tablet by mouth on Tuesday & Saturdays 03/26/20   [provider]  senna (SENOKOT) 8.6 MG TABS tablet Take 1 tablet (8.6 mg total) by mouth 2 (two) times daily. 07/25/22   Eugenie Filler, MD      Allergies    Patient has no known allergies.    Review of Systems   Review of Systems  Constitutional:  Negative for fever.  Respiratory:  Negative for shortness of breath.   Cardiovascular:  Negative for chest pain.  Gastrointestinal:  Negative for abdominal pain.  Musculoskeletal:  Negative for neck pain.  Neurological:  Negative for headaches.    Physical Exam Updated Vital Signs BP (!) 143/74   Pulse 97   Temp 99.9 F (37.7 C) (Oral)   Resp 18   Ht '6\' 5"'$  (1.956 m)   Wt 79.9 kg   SpO2 95%   BMI 20.89 kg/m  Physical Exam Vitals and nursing note reviewed.  Constitutional:      General: He is not in acute distress.    Appearance: Normal appearance. He is well-developed.  HENT:     Head: Normocephalic and atraumatic.  Eyes:     Conjunctiva/sclera: Conjunctivae normal.  Cardiovascular:     Rate and Rhythm:  Normal rate and regular rhythm.     Heart sounds: No murmur heard. Pulmonary:     Effort: Pulmonary effort is normal. No respiratory distress.     Breath sounds: Normal breath sounds.  Abdominal:     Palpations: Abdomen is soft.     Tenderness: There is no abdominal tenderness.  Musculoskeletal:        General: Tenderness present.     Cervical back: Neck supple.     Comments: He has some tenderness around his left hip and is shortened.  Distal pulses motor and sensation intact  Skin:    General: Skin is warm and dry.     Capillary Refill: Capillary refill takes less than 2 seconds.  Neurological:     General: No focal deficit present.     Mental Status: He is alert.     ED Results / Procedures / Treatments    Labs (all labs ordered are listed, but only abnormal results are displayed) Labs Reviewed  SURGICAL PCR SCREEN - Abnormal; Notable for the following components:      Result Value   MRSA, PCR POSITIVE (*)    Staphylococcus aureus POSITIVE (*)    All other components within normal limits  BASIC METABOLIC PANEL - Abnormal; Notable for the following components:   Glucose, Bld 420 (*)    Calcium 8.8 (*)    All other components within normal limits  CBC WITH DIFFERENTIAL/PLATELET - Abnormal; Notable for the following components:   RBC 3.56 (*)    Hemoglobin 10.8 (*)    HCT 34.5 (*)    All other components within normal limits  COMPREHENSIVE METABOLIC PANEL - Abnormal; Notable for the following components:   Glucose, Bld 327 (*)    Total Protein 5.9 (*)    Albumin 2.8 (*)    AST 11 (*)    All other components within normal limits  CBC - Abnormal; Notable for the following components:   RBC 3.17 (*)    Hemoglobin 9.7 (*)    HCT 31.3 (*)    All other components within normal limits  GLUCOSE, CAPILLARY - Abnormal; Notable for the following components:   Glucose-Capillary 292 (*)    All other components within normal limits  GLUCOSE, CAPILLARY - Abnormal; Notable for the following components:   Glucose-Capillary 287 (*)    All other components within normal limits  CBG MONITORING, ED - Abnormal; Notable for the following components:   Glucose-Capillary 353 (*)    All other components within normal limits  HEMOGLOBIN A1C  TYPE AND SCREEN    EKG None  Radiology DG Hip Unilat With Pelvis 2-3 Views Left  Result Date: 08/23/2022 CLINICAL DATA:  Left hip pain and dislocation. EXAM: DG HIP (WITH OR WITHOUT PELVIS) 2-3V LEFT COMPARISON:  July 27, 2022. FINDINGS: Status post left total hip arthroplasty. 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 No definite fracture is noted. 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 Electronically  Signed   By: Marijo Conception M.D.   On: 08/23/2022 13:59    Procedures .Sedation  Date/Time: 08/24/2022 9:49 AM  Performed by: [name], MD Authorized by: [name], MD   Consent:    Consent obtained:  Written   Consent given by:  Patient   Risks discussed:  Allergic reaction, dysrhythmia, inadequate sedation, nausea, vomiting, respiratory compromise necessitating ventilatory assistance and intubation, prolonged sedation necessitating reversal and prolonged hypoxia resulting in organ damage   Alternatives  discussed:  Analgesia without sedation and anxiolysis Universal protocol:    Procedure explained and questions answered to patient or proxy's satisfaction: yes     Relevant documents present and verified: yes     Test results available: yes     Imaging studies available: yes     Required blood products, implants, devices, and special equipment available: yes     Immediately prior to procedure, a time out was called: yes     Patient identity confirmed:  Verbally with patient and arm band Indications:    Procedure necessitating sedation performed by:  Physician performing sedation Pre-sedation assessment:    Time since last food or drink:  4   ASA classification: class 3 - patient with severe systemic disease     Mouth opening:  2 finger widths   Thyromental distance:  3 finger widths   Mallampati score:  II - soft palate, uvula, fauces visible   Neck mobility: normal     Pre-sedation assessments completed and reviewed: airway patency, cardiovascular function, hydration status, mental status, nausea/vomiting, pain level, respiratory function and temperature     Pre-sedation assessment completed:  08/23/2022 3:30 PM Immediate pre-procedure details:    Reassessment: Patient reassessed immediately prior to procedure     Reviewed: vital signs, relevant labs/tests and NPO status     Verified: bag valve mask available, emergency equipment available, intubation equipment  available, IV patency confirmed, oxygen available and suction available   Procedure details (see MAR for exact dosages):    Preoxygenation:  Nasal cannula   Sedation:  Propofol   Intended level of sedation: deep   Intra-procedure monitoring:  Blood pressure monitoring, cardiac monitor, continuous pulse oximetry, continuous capnometry, frequent LOC assessments and frequent vital sign checks   Intra-procedure events: none     Total Provider sedation time (minutes):  15 Post-procedure details:    Post-sedation assessment completed:  08/23/2022 4:32 PM   Attendance: Constant attendance by certified staff until patient recovered     Recovery: Patient returned to pre-procedure baseline     Post-sedation assessments completed and reviewed: airway patency, cardiovascular function, hydration status, mental status, nausea/vomiting, pain level, respiratory function and temperature     Patient is stable for discharge or admission: yes     Procedure completion:  Tolerated well, no immediate complications     Medications Ordered in ED Medications  povidone-iodine 10 % swab 2 Application (has no administration in time range)  chlorhexidine (HIBICLENS) 4 % liquid 4 Application (has no administration in time range)  povidone-iodine 10 % swab 2 Application (has no administration in time range)  ceFAZolin (ANCEF) IVPB 2g/100 mL premix (has no administration in time range)  tranexamic acid (CYKLOKAPRON) IVPB 1,000 mg (has no administration in time range)  vancomycin (VANCOCIN) IVPB 1000 mg/200 mL premix (has no administration in time range)  acetaminophen (TYLENOL) tablet 1,000 mg (0 mg Oral Hold 08/24/22 0815)  insulin aspart (novoLOG) injection 0-15 Units ( Subcutaneous Automatically Held 09/08/22 1700)  insulin aspart (novoLOG) injection 0-5 Units ( Subcutaneous Automatically Held 09/08/22 2200)  acetaminophen (TYLENOL) tablet 650 mg ( Oral MAR Hold 08/24/22 0942)    Or  acetaminophen (TYLENOL)  suppository 650 mg ( Rectal MAR Hold 08/24/22 0942)  oxyCODONE (Oxy IR/ROXICODONE) immediate release tablet 5 mg ( Oral MAR Hold 08/24/22 0942)  morphine (PF) 4 MG/ML injection 4 mg ( Intravenous MAR Hold 08/24/22 0942)  0.9 %  sodium chloride infusion ( Intravenous New Bag/Given 08/23/22 1851)  enoxaparin (LOVENOX) injection 40 mg (  Subcutaneous MAR Hold 08/24/22 0942)  aspirin EC tablet 325 mg ( Oral Automatically Held 09/08/22 1000)  midodrine (PROAMATINE) tablet 2.5 mg ( Oral Automatically Held 09/01/22 1700)  fludrocortisone (FLORINEF) tablet 0.2 mg ( Oral Automatically Held 09/08/22 1000)  mupirocin ointment (BACTROBAN) 2 % 1 Application ( Nasal Automatically Held 08/28/22 1000)  propofol (DIPRIVAN) 10 mg/mL bolus/IV push 44.3 mg (140 mg Intravenous Given 08/23/22 1540)  insulin aspart (novoLOG) injection 10 Units (10 Units Subcutaneous Given 08/23/22 1704)    ED Course/ Medical Decision Making/ A&P Clinical Course as of 08/24/22 0948  Thu Aug 23, 2022  1402 X-ray showing superior posterior dislocation of left hip.  Awaiting radiology reading. [MB]  1413 Discussed with orthopedic attending Dr. Lyla Glassing who would like to come down and evaluate patient for possible reduction. [MB]  1548 Received propofol sedation.  Dr. Lyla Glassing in attendance was unable to reduce hip dislocation.  Patient is being recovered now.  Dr. Lyla Glassing asked if patient can be admitted to hospitalist service anticipation for OR tomorrow. [MB]  84 Discussed with Dr. Marylyn Ishihara [facility] hospitalist who will evaluate patient for admission. [MB]    Clinical Course User Index [MB] [name], MD                           Medical Decision Making Amount and/or Complexity of Data Reviewed Labs: ordered. Radiology: ordered.  Risk Decision regarding hospitalization.   This patient complains of left hip pain; this involves an extensive number of treatment Options and is a complaint that carries with it a high risk of  complications and morbidity. The differential includes dislocation, fracture, contusion, infection  I ordered, reviewed and interpreted labs, which included CBC with normal white count, hemoglobin low stable from priors, chemistries normal other than markedly elevated glucose I ordered medication IV propofol for conscious sedation and reviewed PMP when indicated. I ordered imaging studies which included pelvis and left hip and I independently    visualized and interpreted imaging which showed superior dislocation Additional history obtained from patient's wife and EMS Previous records obtained and reviewed in epic including recent ED visit for hip dislocation I consulted Dr. Lyla Glassing orthopedics and Dr. Marylyn Ishihara [facility] hospitalist and discussed lab and imaging findings and discussed disposition.  Cardiac monitoring reviewed, sinus rhythm Social determinants considered, ongoing tobacco use Critical Interventions: None  After the interventions stated above, I reevaluated the patient and found patient to recovered from sedation.  Unfortunately hip still not reduced. Admission and further testing considered, per orthopedic recommendations patient to be admitted to medical service with plan for OR tomorrow for possible open reduction.  Patient updated on plan.         Final Clinical Impression(s) / ED Diagnoses Final diagnoses:  Dislocation of left hip, initial encounter ([place])  Hyperglycemia    Rx / DC Orders ED Discharge Orders     None         [name], MD 08/24/22 [phone]

## 2022-08-23 NOTE — H&P (Addendum)
History and Physical    Patient: Micheal Moore. RCV:[id number] DOB: [birth year] DOA: 08/23/2022 DOS: the patient was seen and examined on 08/23/2022 PCP: [name], MD  Patient coming from: Home  Chief Complaint:  Chief Complaint  Patient presents with   Hip Pain   HPI: Micheal Moore. is a 71 y.o. male with medical history significant of orthostatic hypotension, HLD, DM2, chronic diastolic HF, DVT. Presenting with left hip pain. He had a total left hip arthroplasty on 07/20/22 after suffering a fracture from a fall. Since that time, he's had another fall that resulted in dislocation of that hip. It was reduced in the ED on 07/27/22. He had been doing well until a few days ago. He can not identify an inciting event, but he know that he has not been able to put weight on his left hip in several days. He denies any falls or trauma to the area. He reports that he still has full sensation. He denies any neuropathy. When his symptoms did not improve this morning, he decided to come to the ED for evaluation. He denies any other aggravating or alleviating factors.       Review of Systems: As mentioned in the history of present illness. All other systems reviewed and are negative. Past Medical History:  Diagnosis Date   Cancer ([place])    right elbow melanoma   Diabetes mellitus without complication ([place])    Type II   Dizziness    DVT (deep venous thrombosis) ([facility])    right leg   Frequent falls    Near syncope    Syncope    Past Surgical History:  Procedure Laterality Date   AMPUTATION TOE Left 04/28/2020   Procedure: AMPUTATION LEFT GREAT TOE;  Surgeon: Newt Minion, MD;  Location: [facility];  Service: Orthopedics;  Laterality: Left;   INTRAMEDULLARY (IM) NAIL INTERTROCHANTERIC Right 02/23/2022   Procedure: INTRAMEDULLARY (IM) NAIL INTERTROCHANTRIC, RIGHT;  Surgeon: Rod Can, MD;  Location: [facility];  Service: Orthopedics;  Laterality: Right;   MELANOMA EXCISION WITH  SENTINEL LYMPH NODE BIOPSY Right 08/19/2020   Procedure: WIDE LOCAL EXCISION RIGHT ELBOW MELANOMA WITH RIGHT AXILLARY SENTINEL LYMPH NODE BIOPSY;  Surgeon: Dwan Bolt, MD;  Location: [facility];  Service: General;  Laterality: Right;  2nd incision in axilla   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   Procedure: TOTAL HIP ARTHROPLASTY ANTERIOR APPROACH;  Surgeon: Rod Can, MD;  Location: [facility];  Service: Orthopedics;  Laterality: Left;   Social History:  reports that he has been smoking cigarettes. He has a 3.80 pack-year smoking history. He has never used smokeless tobacco. He reports that he does not currently use drugs. He reports that he does not drink alcohol.  No Known Allergies  Family History  Problem Relation Age of Onset   Stroke Mother    Diabetes Father    Stroke Sister    Diabetes Sister     Prior to Admission medications   Medication Sig Start Date End Date Taking? Authorizing Provider  apixaban (ELIQUIS) 2.5 MG TABS tablet Take 1 tablet (2.5 mg total) by mouth 2 (two) times daily. 07/23/22   Charlott Rakes, PA-C  Ensure Max Protein (ENSURE MAX PROTEIN) LIQD Take 330 mLs (11 oz total) by mouth 2 (two) times daily. 07/25/22   Eugenie Filler, MD  fludrocortisone (FLORINEF) 0.1 MG tablet Take 2 tablets (0.2 mg total) by mouth daily. 03/05/22   Lavina Hamman, MD  glimepiride [employer]) 2  MG tablet Take 2 mg by mouth daily.  01/26/19   [provider]  JARDIANCE 10 MG TABS tablet Take 10 mg by mouth daily. 07/05/22   [provider]  metoprolol tartrate (LOPRESSOR) 25 MG tablet Take 0.5 tablets (12.5 mg total) by mouth 2 (two) times daily. 07/25/22   Eugenie Filler, MD  midodrine (PROAMATINE) 5 MG tablet Take 0.5 tablets (2.5 mg total) by mouth 3 (three) times daily with meals. 07/25/22   Eugenie Filler, MD  polyethylene glycol (MIRALAX / GLYCOLAX) 17 g packet Take 17 g by mouth daily. Patient not taking: Reported on 07/19/2022 03/05/22   Lavina Hamman,  MD  rosuvastatin (CRESTOR) 5 MG tablet Take 5 mg by mouth See admin instructions. Take one tablet by mouth on Tuesday & Saturdays 03/26/20   [provider]  senna (SENOKOT) 8.6 MG TABS tablet Take 1 tablet (8.6 mg total) by mouth 2 (two) times daily. 07/25/22   Eugenie Filler, MD    Physical Exam: Vitals:   08/23/22 1352 08/23/22 1400 08/23/22 1500 08/23/22 1539  BP: (!) 145/71  (!) 149/92   Pulse: 95  85 93  Resp: '18  18 17  '$ Temp:      TempSrc:      SpO2: 98%  97% 100%  Weight:  88.5 kg     General: 71 y.o. male resting in bed in NAD Eyes: PERRL, normal sclera ENMT: Nares patent w/o discharge, orophaynx clear, dentition normal, ears w/o discharge/lesions/ulcers Neck: Supple, trachea midline Cardiovascular: RRR, +S1, S2, no m/g/r, equal pulses throughout Respiratory: CTABL, no w/r/r, normal WOB GI: BS+, NDNT, no masses noted, no organomegaly noted MSK: No e/c/c; limited ROM left hip d/t pain Neuro: A&O x 3, no focal deficits Psyc: Appropriate interaction and affect, calm/cooperative  Data Reviewed:  Results for orders placed or performed during the hospital encounter of 08/23/22 (from the past 24 hour(s))  Basic metabolic panel     Status: Abnormal   Collection Time: 08/23/22 12:50 PM  Result Value Ref Range   Sodium 138 135 - 145 mmol/L   Potassium 3.7 3.5 - 5.1 mmol/L   Chloride 102 98 - 111 mmol/L   CO2 25 22 - 32 mmol/L   Glucose, Bld 420 (H) 70 - 99 mg/dL   BUN 16 8 - 23 mg/dL   Creatinine, Ser 0.75 0.61 - 1.24 mg/dL   Calcium 8.8 (L) 8.9 - 10.3 mg/dL   GFR, Estimated >60 >60 mL/min   Anion gap 11 5 - 15  CBC with Differential     Status: Abnormal   Collection Time: 08/23/22 12:50 PM  Result Value Ref Range   WBC 7.6 4.0 - 10.5 K/uL   RBC 3.56 (L) 4.22 - 5.81 MIL/uL   Hemoglobin 10.8 (L) 13.0 - 17.0 g/dL   HCT 34.5 (L) 39.0 - 52.0 %   MCV 96.9 80.0 - 100.0 fL   MCH 30.3 26.0 - 34.0 pg   MCHC 31.3 30.0 - 36.0 g/dL   RDW 14.2 11.5 - 15.5 %    Platelets 195 150 - 400 K/uL   nRBC 0.0 0.0 - 0.2 %   Neutrophils Relative % 70 %   Neutro Abs 5.4 1.7 - 7.7 K/uL   Lymphocytes Relative 21 %   Lymphs Abs 1.6 0.7 - 4.0 K/uL   Monocytes Relative 5 %   Monocytes Absolute 0.4 0.1 - 1.0 K/uL   Eosinophils Relative 3 %   Eosinophils Absolute 0.2 0.0 - 0.5  K/uL   Basophils Relative 1 %   Basophils Absolute 0.0 0.0 - 0.1 K/uL   Immature Granulocytes 0 %   Abs Immature Granulocytes 0.03 0.00 - 0.07 K/uL   XR Left Hip Superior dislocation of left hip prosthesis is again noted.   Assessment and Plan: Acute left hip dislocation     - place in obs, med-surg     - attempted reduction in ED unsuccessful; to go to OR w/ [facility] tomorrow     - NPOpMN     - pain control  DM2     - SSI, glucose checks, DM diet  Orthostatic hypotension     - continue home regimen when confirmed  HLD     - continue home regimen when confirmed  Chronic HFpEF    - continue home regimen when confirmed  Hx of DVT     - had an acute DVT in 2019; repeat dopplers in 2021 were negative     - notified by pharmacy that he has no eliquis fill history; apparently non-compliant on this medication     - at this point will add PPx lovenox  Normocytic anemia     - at baseline, no evidence of bleed, follow  Advance Care Planning:   Code Status: FULL  Consults: [facility] (Dr. Lillia Corporal)  Family Communication: None at bedside  Severity of Illness: The appropriate patient status for this patient is OBSERVATION. Observation status is judged to be reasonable and necessary in order to provide the required intensity of service to ensure the patient's safety. The patient's presenting symptoms, physical exam findings, and initial radiographic and laboratory data in the context of their medical condition is felt to place them at decreased risk for further clinical deterioration. Furthermore, it is anticipated that the patient will be medically stable for discharge from  the hospital within 2 midnights of admission.   Author: Jonnie Finner, DO 08/23/2022 4:44 PM  For on call review [email or web address].

## 2022-08-23 NOTE — Consult Note (Signed)
ORTHOPAEDIC CONSULTATION  REQUESTING PHYSICIAN: [name], MD  PCP:  [name], MD  Chief Complaint: Left hip pain, inability to weight-bear  HPI: Micheal Avans. is a 71 y.o. male who is known to me for primary left total hip arthroplasty for displaced femoral neck fracture on 07/20/2022.  Postoperatively, he fell on the 10th, sustaining a periprosthetic dislocation of the left hip.  He underwent successful closed reduction by emergency department staff on that same day.  I saw him in the office on November 17 and his hip normal.  He presented to the ER today with inability to weight-bear on the left hip for several days.  He does not remember how long it has been since he walked.  He does not recall any falls or injuries.  Past Medical History:  Diagnosis Date   Cancer ([facility])    right elbow melanoma   Diabetes mellitus without complication ([facility])    Type II   Dizziness    DVT (deep venous thrombosis) ([facility])    right leg   Frequent falls    Near syncope    Syncope    Past Surgical History:  Procedure Laterality Date   AMPUTATION TOE Left 04/28/2020   Procedure: AMPUTATION LEFT GREAT TOE;  Surgeon: Newt Minion, MD;  Location: [facility];  Service: Orthopedics;  Laterality: Left;   INTRAMEDULLARY (IM) NAIL INTERTROCHANTERIC Right 02/23/2022   Procedure: INTRAMEDULLARY (IM) NAIL INTERTROCHANTRIC, RIGHT;  Surgeon: Rod Can, MD;  Location: [facility];  Service: Orthopedics;  Laterality: Right;   MELANOMA EXCISION WITH SENTINEL LYMPH NODE BIOPSY Right 08/19/2020   Procedure: WIDE LOCAL EXCISION RIGHT ELBOW MELANOMA WITH RIGHT AXILLARY SENTINEL LYMPH NODE BIOPSY;  Surgeon: Dwan Bolt, MD;  Location: [facility];  Service: General;  Laterality: Right;  2nd incision in axilla   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   Procedure: TOTAL HIP ARTHROPLASTY ANTERIOR APPROACH;  Surgeon: Rod Can, MD;  Location: [facility];  Service: Orthopedics;  Laterality: Left;   Social  History   Socioeconomic History   Marital status: Married    Spouse name: [name]   Number of children: 1   Years of education: Not on file   Highest education level: Associate degree: occupational, Hotel manager, or vocational program  Occupational History    Comment: retired  Tobacco Use   Smoking status: Every Day    Packs/day: 0.20    Years: 19.00    Total pack years: 3.80    Types: Cigarettes   Smokeless tobacco: Never  Vaping Use   Vaping Use: Never used  Substance and Sexual Activity   Alcohol use: No   Drug use: Not Currently   Sexual activity: Not on file  Other Topics Concern   Not on file  Social History Narrative   Lives with wife   Social Determinants of Health   Financial Resource Strain: Not on file  Food Insecurity: No Food Insecurity (07/19/2022)   Hunger Vital Sign    Worried About Running Out of Food in the Last Year: Never true    Ran Out of Food in the Last Year: Never true  Transportation Needs: No Transportation Needs (07/19/2022)   PRAPARE - Hydrologist (Medical): No    Lack of Transportation (Non-Medical): No  Physical Activity: Not on file  Stress: Not on file  Social Connections: Not on file   Family History  Problem Relation Age of Onset   Stroke Mother  Diabetes Father    Stroke Sister    Diabetes Sister    No Known Allergies Prior to Admission medications   Medication Sig Start Date End Date Taking? Authorizing Provider  apixaban (ELIQUIS) 2.5 MG TABS tablet Take 1 tablet (2.5 mg total) by mouth 2 (two) times daily. 07/23/22   Charlott Rakes, PA-C  Ensure Max Protein (ENSURE MAX PROTEIN) LIQD Take 330 mLs (11 oz total) by mouth 2 (two) times daily. 07/25/22   Eugenie Filler, MD  fludrocortisone (FLORINEF) 0.1 MG tablet Take 2 tablets (0.2 mg total) by mouth daily. 03/05/22   Lavina Hamman, MD  glimepiride (AMARYL) 2 MG tablet Take 2 mg by mouth daily.  01/26/19   [provider]  JARDIANCE 10 MG  TABS tablet Take 10 mg by mouth daily. 07/05/22   [provider]  metoprolol tartrate (LOPRESSOR) 25 MG tablet Take 0.5 tablets (12.5 mg total) by mouth 2 (two) times daily. 07/25/22   Eugenie Filler, MD  midodrine (PROAMATINE) 5 MG tablet Take 0.5 tablets (2.5 mg total) by mouth 3 (three) times daily with meals. 07/25/22   Eugenie Filler, MD  polyethylene glycol (MIRALAX / GLYCOLAX) 17 g packet Take 17 g by mouth daily. Patient not taking: Reported on 07/19/2022 03/05/22   Lavina Hamman, MD  rosuvastatin (CRESTOR) 5 MG tablet Take 5 mg by mouth See admin instructions. Take one tablet by mouth on Tuesday & Saturdays 03/26/20   [provider]  senna (SENOKOT) 8.6 MG TABS tablet Take 1 tablet (8.6 mg total) by mouth 2 (two) times daily. 07/25/22   Eugenie Filler, MD   DG Hip Unilat With Pelvis 2-3 Views Left  Result Date: 08/23/2022 CLINICAL DATA:  Left hip pain and dislocation. EXAM: DG HIP (WITH OR WITHOUT PELVIS) 2-3V LEFT COMPARISON:  July 27, 2022. FINDINGS: Status post left total hip arthroplasty. 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 No definite fracture is noted. 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 Electronically Signed   By: Marijo Conception M.D.   On: 08/23/2022 13:59    Positive ROS: All other systems have been reviewed and were otherwise negative with the exception of those mentioned in the HPI and as above.  Physical Exam: General: Alert, no acute distress Cardiovascular: No pedal edema Respiratory: No cyanosis, no use of accessory musculature GI: No organomegaly, abdomen is soft and non-tender Skin: No lesions in the area of chief complaint Neurologic: Sensation intact distally Psychiatric: Patient is competent for consent with normal mood and affect Lymphatic: No axillary or cervical lymphadenopathy  MUSCULOSKELETAL: Examination of the left hip reveals a healed anterior incision.  There is  deformity about the left hip.  He has shortened.  There is no focal motor or sensory deficit.  His foot is perfused.  Assessment: Subacute periprosthetic left hip dislocation, second occurrence.  Plan: I discussed the findings with the patient.  It is unclear how many days his hip has been dislocated.  I recommend trying a closed reduction in the emergency department.  After consent was obtained, conscious sedation was administered by the emergency department staff.  I attempted to reduce his hip without success.  Patient will require open reduction of the left hip, possible head and liner exchange in the operating room.  Plan for surgery tomorrow.  N.p.o. after midnight.  EDP plans to call [facility] for admission given his multiple medical problems.  Appreciate their assistance.  Hilton Cork  [place], MD [phone]    08/23/2022 3:54 PM

## 2022-08-23 NOTE — ED Triage Notes (Signed)
Ems brings pt in from home. Pt reports he is bed bound but his left hip is popped out of joint. Pt denies pain, denies falls.

## 2022-08-23 NOTE — Plan of Care (Signed)
  Problem: Education: Goal: Ability to describe self-care measures that may prevent or decrease complications (Diabetes Survival Skills Education) will improve Outcome: Progressing   Problem: Education: Goal: Knowledge of General Education information will improve Description: Including pain rating scale, medication(s)/side effects and non-pharmacologic comfort measures Outcome: Progressing   Problem: Activity: Goal: Risk for activity intolerance will decrease Outcome: Progressing   Problem: Coping: Goal: Level of anxiety will decrease Outcome: Progressing   Problem: Elimination: Goal: Will not experience complications related to bowel motility Outcome: Progressing

## 2022-08-24 ENCOUNTER — Inpatient Hospital Stay (HOSPITAL_COMMUNITY): Payer: Medicare HMO

## 2022-08-24 ENCOUNTER — Observation Stay (HOSPITAL_COMMUNITY): Payer: Medicare HMO | Admitting: Certified Registered Nurse Anesthetist

## 2022-08-24 ENCOUNTER — Other Ambulatory Visit: Payer: Self-pay

## 2022-08-24 ENCOUNTER — Encounter (HOSPITAL_COMMUNITY)
Admission: EM | Disposition: A | Discharge status: Home Health | Payer: Self-pay | Source: Home / Self Care | Attending: Internal Medicine

## 2022-08-24 ENCOUNTER — Encounter (HOSPITAL_COMMUNITY): Payer: Self-pay | Admitting: Internal Medicine

## 2022-08-24 DIAGNOSIS — Y831 Surgical operation with implant of artificial internal device as the cause of abnormal reaction of the patient, or of later complication, without mention of misadventure at the time of the procedure: Secondary | ICD-10-CM | POA: Diagnosis present

## 2022-08-24 DIAGNOSIS — D62 Acute posthemorrhagic anemia: Secondary | ICD-10-CM | POA: Diagnosis not present

## 2022-08-24 DIAGNOSIS — S73005A Unspecified dislocation of left hip, initial encounter: Secondary | ICD-10-CM | POA: Diagnosis present

## 2022-08-24 DIAGNOSIS — S73035A Other anterior dislocation of left hip, initial encounter: Secondary | ICD-10-CM | POA: Diagnosis not present

## 2022-08-24 DIAGNOSIS — E7800 Pure hypercholesterolemia, unspecified: Secondary | ICD-10-CM

## 2022-08-24 DIAGNOSIS — Z89412 Acquired absence of left great toe: Secondary | ICD-10-CM | POA: Diagnosis not present

## 2022-08-24 DIAGNOSIS — Z7984 Long term (current) use of oral hypoglycemic drugs: Secondary | ICD-10-CM | POA: Diagnosis not present

## 2022-08-24 DIAGNOSIS — L304 Erythema intertrigo: Secondary | ICD-10-CM | POA: Diagnosis present

## 2022-08-24 DIAGNOSIS — I951 Orthostatic hypotension: Secondary | ICD-10-CM | POA: Diagnosis present

## 2022-08-24 DIAGNOSIS — Z471 Aftercare following joint replacement surgery: Secondary | ICD-10-CM | POA: Diagnosis not present

## 2022-08-24 DIAGNOSIS — I110 Hypertensive heart disease with heart failure: Secondary | ICD-10-CM | POA: Diagnosis present

## 2022-08-24 DIAGNOSIS — Z823 Family history of stroke: Secondary | ICD-10-CM | POA: Diagnosis not present

## 2022-08-24 DIAGNOSIS — D649 Anemia, unspecified: Secondary | ICD-10-CM | POA: Diagnosis not present

## 2022-08-24 DIAGNOSIS — E785 Hyperlipidemia, unspecified: Secondary | ICD-10-CM

## 2022-08-24 DIAGNOSIS — Z86718 Personal history of other venous thrombosis and embolism: Secondary | ICD-10-CM

## 2022-08-24 DIAGNOSIS — E1165 Type 2 diabetes mellitus with hyperglycemia: Secondary | ICD-10-CM | POA: Diagnosis present

## 2022-08-24 DIAGNOSIS — E1169 Type 2 diabetes mellitus with other specified complication: Secondary | ICD-10-CM | POA: Diagnosis present

## 2022-08-24 DIAGNOSIS — T84021A Dislocation of internal left hip prosthesis, initial encounter: Secondary | ICD-10-CM | POA: Diagnosis present

## 2022-08-24 DIAGNOSIS — I5032 Chronic diastolic (congestive) heart failure: Secondary | ICD-10-CM | POA: Diagnosis present

## 2022-08-24 DIAGNOSIS — Z96642 Presence of left artificial hip joint: Secondary | ICD-10-CM | POA: Diagnosis not present

## 2022-08-24 DIAGNOSIS — E119 Type 2 diabetes mellitus without complications: Secondary | ICD-10-CM | POA: Diagnosis not present

## 2022-08-24 DIAGNOSIS — E871 Hypo-osmolality and hyponatremia: Secondary | ICD-10-CM | POA: Diagnosis not present

## 2022-08-24 DIAGNOSIS — Z85820 Personal history of malignant melanoma of skin: Secondary | ICD-10-CM | POA: Diagnosis not present

## 2022-08-24 DIAGNOSIS — R296 Repeated falls: Secondary | ICD-10-CM | POA: Diagnosis present

## 2022-08-24 DIAGNOSIS — Z7901 Long term (current) use of anticoagulants: Secondary | ICD-10-CM | POA: Diagnosis not present

## 2022-08-24 DIAGNOSIS — Z79899 Other long term (current) drug therapy: Secondary | ICD-10-CM | POA: Diagnosis not present

## 2022-08-24 DIAGNOSIS — F17210 Nicotine dependence, cigarettes, uncomplicated: Secondary | ICD-10-CM | POA: Diagnosis present

## 2022-08-24 DIAGNOSIS — L249 Irritant contact dermatitis, unspecified cause: Secondary | ICD-10-CM | POA: Diagnosis present

## 2022-08-24 DIAGNOSIS — Z833 Family history of diabetes mellitus: Secondary | ICD-10-CM | POA: Diagnosis not present

## 2022-08-24 HISTORY — PX: TOTAL HIP ARTHROPLASTY: SHX124

## 2022-08-24 LAB — CBC
HCT: 31.3 % — ABNORMAL LOW (ref 39.0–52.0)
Hemoglobin: 9.7 g/dL — ABNORMAL LOW (ref 13.0–17.0)
MCH: 30.6 pg (ref 26.0–34.0)
MCHC: 31 g/dL (ref 30.0–36.0)
MCV: 98.7 fL (ref 80.0–100.0)
Platelets: 152 10*3/uL (ref 150–400)
RBC: 3.17 MIL/uL — ABNORMAL LOW (ref 4.22–5.81)
RDW: 14.2 % (ref 11.5–15.5)
WBC: 7.9 10*3/uL (ref 4.0–10.5)
nRBC: 0 % (ref 0.0–0.2)

## 2022-08-24 LAB — COMPREHENSIVE METABOLIC PANEL
ALT: 13 U/L (ref 0–44)
AST: 11 U/L — ABNORMAL LOW (ref 15–41)
Albumin: 2.8 g/dL — ABNORMAL LOW (ref 3.5–5.0)
Alkaline Phosphatase: 111 U/L (ref 38–126)
Anion gap: 8 (ref 5–15)
BUN: 22 mg/dL (ref 8–23)
CO2: 25 mmol/L (ref 22–32)
Calcium: 8.9 mg/dL (ref 8.9–10.3)
Chloride: 104 mmol/L (ref 98–111)
Creatinine, Ser: 0.61 mg/dL (ref 0.61–1.24)
GFR, Estimated: >60 mL/min (ref >60)
Glucose, Bld: 327 mg/dL — ABNORMAL HIGH (ref 70–99)
Potassium: 4.1 mmol/L (ref 3.5–5.1)
Sodium: 137 mmol/L (ref 135–145)
Total Bilirubin: 0.9 mg/dL (ref 0.3–1.2)
Total Protein: 5.9 g/dL — ABNORMAL LOW (ref 6.5–8.1)

## 2022-08-24 LAB — SURGICAL PCR SCREEN
MRSA, PCR: POSITIVE — AB
Staphylococcus aureus: POSITIVE — AB

## 2022-08-24 LAB — TYPE AND SCREEN
ABO/RH(D): A POS
Antibody Screen: NEGATIVE

## 2022-08-24 LAB — GLUCOSE, CAPILLARY
Glucose-Capillary: 287 mg/dL — ABNORMAL HIGH (ref 70–99)
Glucose-Capillary: 219 mg/dL — ABNORMAL HIGH (ref 70–99)
Glucose-Capillary: 206 mg/dL — ABNORMAL HIGH (ref 70–99)
Glucose-Capillary: 209 mg/dL — ABNORMAL HIGH (ref 70–99)
Glucose-Capillary: 187 mg/dL — ABNORMAL HIGH (ref 70–99)
Glucose-Capillary: 241 mg/dL — ABNORMAL HIGH (ref 70–99)

## 2022-08-24 SURGERY — ARTHROPLASTY, HIP, TOTAL, ANTERIOR APPROACH
Anesthesia: Choice | Site: Hip | Laterality: Left

## 2022-08-24 SURGERY — ARTHROPLASTY, HIP, TOTAL, ANTERIOR APPROACH
Anesthesia: General | Site: Hip | Laterality: Left

## 2022-08-24 MED ORDER — MENTHOL 3 MG MT LOZG: 1.0000 | LOZENGE | OROMUCOSAL | Status: DC | PRN

## 2022-08-24 MED ORDER — ALBUMIN HUMAN 5 % IV SOLN
INTRAVENOUS | Status: AC
  Filled 2022-08-24: qty 250

## 2022-08-24 MED ORDER — FENTANYL CITRATE (PF) 100 MCG/2ML IJ SOLN
INTRAMUSCULAR | Status: DC | PRN
  Administered 2022-08-24 (×2): 50 ug via INTRAVENOUS
  Administered 2022-08-24: 100 ug via INTRAVENOUS

## 2022-08-24 MED ORDER — HYDROCODONE-ACETAMINOPHEN 5-325 MG PO TABS
1.0000 | ORAL_TABLET | ORAL | Status: DC | PRN
  Administered 2022-08-24 – 2022-08-26 (×5): 2 via ORAL
  Filled 2022-08-24 (×6): qty 2

## 2022-08-24 MED ORDER — MORPHINE SULFATE (PF) 2 MG/ML IV SOLN: 0.5000 mg | INTRAVENOUS | Status: DC | PRN

## 2022-08-24 MED ORDER — WATER FOR IRRIGATION, STERILE IR SOLN
Status: DC | PRN
  Administered 2022-08-24: 1000 mL

## 2022-08-24 MED ORDER — SODIUM CHLORIDE 0.9 % IV SOLN: INTRAVENOUS | Status: DC

## 2022-08-24 MED ORDER — METOCLOPRAMIDE HCL 5 MG PO TABS: 5.0000 mg | ORAL_TABLET | Freq: Three times a day (TID) | ORAL | Status: DC | PRN

## 2022-08-24 MED ORDER — SODIUM CHLORIDE 0.9 % IR SOLN
Status: DC | PRN
  Administered 2022-08-24: 1000 mL

## 2022-08-24 MED ORDER — ISOPROPYL ALCOHOL 70 % SOLN
Status: DC | PRN
  Administered 2022-08-24: 1 via TOPICAL

## 2022-08-24 MED ORDER — KETOROLAC TROMETHAMINE 30 MG/ML IJ SOLN: INTRAMUSCULAR | Status: DC | PRN

## 2022-08-24 MED ORDER — VANCOMYCIN HCL 1000 MG IV SOLR
INTRAVENOUS | Status: AC
  Filled 2022-08-24: qty 20

## 2022-08-24 MED ORDER — POLYETHYLENE GLYCOL 3350 17 G PO PACK
17.0000 g | PACK | Freq: Every day | ORAL | Status: DC | PRN
  Administered 2022-08-25: 17 g via ORAL
  Filled 2022-08-24: qty 1

## 2022-08-24 MED ORDER — DIPHENHYDRAMINE HCL 12.5 MG/5ML PO ELIX: 12.5000 mg | ORAL_SOLUTION | ORAL | Status: DC | PRN

## 2022-08-24 MED ORDER — PHENYLEPHRINE HCL (PRESSORS) 10 MG/ML IV SOLN
INTRAVENOUS | Status: AC
  Filled 2022-08-24: qty 1

## 2022-08-24 MED ORDER — FENTANYL CITRATE PF 50 MCG/ML IJ SOSY
25.0000 ug | PREFILLED_SYRINGE | INTRAMUSCULAR | Status: DC | PRN
  Administered 2022-08-24: 25 ug via INTRAVENOUS

## 2022-08-24 MED ORDER — METHOCARBAMOL 500 MG PO TABS
500.0000 mg | ORAL_TABLET | Freq: Four times a day (QID) | ORAL | Status: DC | PRN
  Administered 2022-08-24 – 2022-08-28 (×5): 500 mg via ORAL
  Filled 2022-08-24 (×5): qty 1

## 2022-08-24 MED ORDER — ASPIRIN 81 MG PO CHEW
81.0000 mg | CHEWABLE_TABLET | Freq: Two times a day (BID) | ORAL | Status: DC
  Administered 2022-08-24 – 2022-08-28 (×8): 81 mg via ORAL
  Filled 2022-08-24 (×8): qty 1

## 2022-08-24 MED ORDER — PROPOFOL 10 MG/ML IV BOLUS
INTRAVENOUS | Status: AC
  Filled 2022-08-24: qty 20

## 2022-08-24 MED ORDER — CIPROFLOXACIN HCL 0.3 % OP SOLN
2.0000 [drp] | OPHTHALMIC | Status: DC
  Administered 2022-08-24 – 2022-08-28 (×21): 2 [drp] via OPHTHALMIC
  Filled 2022-08-24 (×2): qty 2.5

## 2022-08-24 MED ORDER — SODIUM CHLORIDE 0.9 % IR SOLN
Status: DC | PRN
  Administered 2022-08-24: 3000 mL

## 2022-08-24 MED ORDER — ONDANSETRON HCL 4 MG/2ML IJ SOLN
INTRAMUSCULAR | Status: DC | PRN
  Administered 2022-08-24: 4 mg via INTRAVENOUS

## 2022-08-24 MED ORDER — ACETAMINOPHEN 325 MG PO TABS: 325.0000 mg | ORAL_TABLET | Freq: Four times a day (QID) | ORAL | Status: DC | PRN

## 2022-08-24 MED ORDER — VANCOMYCIN HCL 1000 MG IV SOLR
INTRAVENOUS | Status: DC | PRN
  Administered 2022-08-24: 1000 mg via TOPICAL

## 2022-08-24 MED ORDER — FENTANYL CITRATE (PF) 100 MCG/2ML IJ SOLN
INTRAMUSCULAR | Status: AC
  Filled 2022-08-24: qty 2

## 2022-08-24 MED ORDER — KETOROLAC TROMETHAMINE 30 MG/ML IJ SOLN
INTRAMUSCULAR | Status: AC
  Filled 2022-08-24: qty 1

## 2022-08-24 MED ORDER — BUPIVACAINE-EPINEPHRINE 0.25% -1:200000 IJ SOLN: INTRAMUSCULAR | Status: DC | PRN

## 2022-08-24 MED ORDER — CEFAZOLIN SODIUM 1 G IJ SOLR
INTRAMUSCULAR | Status: AC
  Filled 2022-08-24: qty 20

## 2022-08-24 MED ORDER — CEFAZOLIN SODIUM-DEXTROSE 2-4 GM/100ML-% IV SOLN
2.0000 g | Freq: Four times a day (QID) | INTRAVENOUS | Status: AC
  Administered 2022-08-24 (×2): 2 g via INTRAVENOUS
  Filled 2022-08-24 (×2): qty 100

## 2022-08-24 MED ORDER — METOCLOPRAMIDE HCL 5 MG/ML IJ SOLN: 5.0000 mg | Freq: Three times a day (TID) | INTRAMUSCULAR | Status: DC | PRN

## 2022-08-24 MED ORDER — CEFAZOLIN SODIUM-DEXTROSE 2-3 GM-%(50ML) IV SOLR
INTRAVENOUS | Status: DC | PRN
  Administered 2022-08-24: 2 g via INTRAVENOUS

## 2022-08-24 MED ORDER — PHENYLEPHRINE HCL-NACL 20-0.9 MG/250ML-% IV SOLN
INTRAVENOUS | Status: DC | PRN
  Administered 2022-08-24: 40 ug/min via INTRAVENOUS

## 2022-08-24 MED ORDER — LIDOCAINE 2% (20 MG/ML) 5 ML SYRINGE
INTRAMUSCULAR | Status: DC | PRN
  Administered 2022-08-24: 60 mg via INTRAVENOUS

## 2022-08-24 MED ORDER — SODIUM CHLORIDE (PF) 0.9 % IJ SOLN
INTRAMUSCULAR | Status: AC
  Filled 2022-08-24: qty 50

## 2022-08-24 MED ORDER — ONDANSETRON HCL 4 MG/2ML IJ SOLN: 4.0000 mg | Freq: Four times a day (QID) | INTRAMUSCULAR | Status: DC | PRN

## 2022-08-24 MED ORDER — LACTATED RINGERS IV SOLN: INTRAVENOUS | Status: DC

## 2022-08-24 MED ORDER — PROPOFOL 10 MG/ML IV BOLUS
INTRAVENOUS | Status: DC | PRN
  Administered 2022-08-24: 120 mg via INTRAVENOUS

## 2022-08-24 MED ORDER — METHOCARBAMOL 1000 MG/10ML IJ SOLN
500.0000 mg | Freq: Four times a day (QID) | INTRAVENOUS | Status: DC | PRN
  Filled 2022-08-24: qty 5

## 2022-08-24 MED ORDER — BUPIVACAINE-EPINEPHRINE (PF) 0.5% -1:200000 IJ SOLN
INTRAMUSCULAR | Status: AC
  Filled 2022-08-24: qty 30

## 2022-08-24 MED ORDER — PHENOL 1.4 % MT LIQD: 1.0000 | OROMUCOSAL | Status: DC | PRN

## 2022-08-24 MED ORDER — ONDANSETRON HCL 4 MG PO TABS: 4.0000 mg | ORAL_TABLET | Freq: Four times a day (QID) | ORAL | Status: DC | PRN

## 2022-08-24 MED ORDER — SODIUM CHLORIDE (PF) 0.9 % IJ SOLN: INTRAMUSCULAR | Status: DC | PRN

## 2022-08-24 MED ORDER — HYDROCODONE-ACETAMINOPHEN 7.5-325 MG PO TABS: 1.0000 | ORAL_TABLET | ORAL | Status: DC | PRN

## 2022-08-24 MED ORDER — FENTANYL CITRATE PF 50 MCG/ML IJ SOSY
PREFILLED_SYRINGE | INTRAMUSCULAR | Status: AC
  Filled 2022-08-24: qty 2

## 2022-08-24 MED ORDER — MUPIROCIN 2 % EX OINT
1.0000 | TOPICAL_OINTMENT | Freq: Two times a day (BID) | CUTANEOUS | Status: DC
  Administered 2022-08-24: 1 via NASAL
  Filled 2022-08-24: qty 22

## 2022-08-24 MED ORDER — 0.9 % SODIUM CHLORIDE (POUR BTL) OPTIME
TOPICAL | Status: DC | PRN
  Administered 2022-08-24: 1000 mL

## 2022-08-24 MED ORDER — ALUM & MAG HYDROXIDE-SIMETH 200-200-20 MG/5ML PO SUSP: 30.0000 mL | ORAL | Status: DC | PRN

## 2022-08-24 MED ORDER — DOCUSATE SODIUM 100 MG PO CAPS
100.0000 mg | ORAL_CAPSULE | Freq: Two times a day (BID) | ORAL | Status: DC
  Administered 2022-08-24 – 2022-08-28 (×8): 100 mg via ORAL
  Filled 2022-08-24 (×8): qty 1

## 2022-08-24 MED ORDER — INSULIN ASPART 100 UNIT/ML IJ SOLN
INTRAMUSCULAR | Status: AC
  Filled 2022-08-24: qty 1

## 2022-08-24 MED ORDER — PHENYLEPHRINE HCL (PRESSORS) 10 MG/ML IV SOLN
INTRAVENOUS | Status: DC | PRN
  Administered 2022-08-24: 160 ug via INTRAVENOUS
  Administered 2022-08-24 (×2): 240 ug via INTRAVENOUS
  Administered 2022-08-24: 160 ug via INTRAVENOUS
  Administered 2022-08-24: 240 ug via INTRAVENOUS

## 2022-08-24 SURGICAL SUPPLY — 56 items
BAG COUNTER SPONGE SURGICOUNT (BAG) IMPLANT
BAG DECANTER FOR FLEXI CONT (MISCELLANEOUS) IMPLANT
BAG ZIPLOCK 12X15 (MISCELLANEOUS) IMPLANT
CHLORAPREP W/TINT 26 (MISCELLANEOUS) ×1 IMPLANT
COVER PERINEAL POST (MISCELLANEOUS) ×1 IMPLANT
COVER SURGICAL LIGHT HANDLE (MISCELLANEOUS) ×1 IMPLANT
DERMABOND ADVANCED .7 DNX12 (GAUZE/BANDAGES/DRESSINGS) ×2 IMPLANT
DRAPE IMP U-DRAPE 54X76 (DRAPES) ×1 IMPLANT
DRAPE SHEET LG 3/4 BI-LAMINATE (DRAPES) ×3 IMPLANT
DRAPE STERI IOBAN 125X83 (DRAPES) ×1 IMPLANT
DRAPE U-SHAPE 47X51 STRL (DRAPES) ×2 IMPLANT
DRESSING PREVENA PLUS CUSTOM (GAUZE/BANDAGES/DRESSINGS) IMPLANT
DRSG AQUACEL AG ADV 3.5X10 (GAUZE/BANDAGES/DRESSINGS) ×1 IMPLANT
DRSG PREVENA PLUS CUSTOM (GAUZE/BANDAGES/DRESSINGS) ×1
ELECT REM PT RETURN 15FT ADLT (MISCELLANEOUS) ×1 IMPLANT
EVACUATOR DRAINAGE 10X20 100CC (DRAIN) IMPLANT
EVACUATOR SILICONE 100CC (DRAIN) ×1
GAUZE SPONGE 4X4 12PLY STRL (GAUZE/BANDAGES/DRESSINGS) ×1 IMPLANT
GLOVE BIO SURGEON STRL SZ8.5 (GLOVE) ×2 IMPLANT
GLOVE BIOGEL M 7.0 STRL (GLOVE) ×1 IMPLANT
GLOVE BIOGEL PI IND STRL 7.5 (GLOVE) ×1 IMPLANT
GLOVE BIOGEL PI IND STRL 8.5 (GLOVE) ×1 IMPLANT
GOWN SPEC L3 XXLG W/TWL (GOWN DISPOSABLE) ×1 IMPLANT
GOWN STRL REUS W/ TWL XL LVL3 (GOWN DISPOSABLE) ×1 IMPLANT
GOWN STRL REUS W/TWL XL LVL3 (GOWN DISPOSABLE) ×1
HANDPIECE INTERPULSE COAX TIP (DISPOSABLE) ×1
HEAD FEM +3XOFST 36XMDLR (Orthopedic Implant) IMPLANT
HEAD MODULAR 36MM (Orthopedic Implant) ×1 IMPLANT
HOLDER FOLEY CATH W/STRAP (MISCELLANEOUS) ×1 IMPLANT
HOOD PEEL AWAY T7 (MISCELLANEOUS) ×3 IMPLANT
KIT DRSG PREVENA PLUS 7DAY 125 (MISCELLANEOUS) IMPLANT
KIT TURNOVER KIT A (KITS) IMPLANT
MANIFOLD NEPTUNE II (INSTRUMENTS) ×1 IMPLANT
MARKER SKIN DUAL TIP RULER LAB (MISCELLANEOUS) ×1 IMPLANT
NDL SAFETY ECLIP 18X1.5 (MISCELLANEOUS) ×1 IMPLANT
NDL SPNL 18GX3.5 QUINCKE PK (NEEDLE) ×1 IMPLANT
NEEDLE SPNL 18GX3.5 QUINCKE PK (NEEDLE) ×1 IMPLANT
PACK ANTERIOR HIP CUSTOM (KITS) ×1 IMPLANT
PENCIL SMOKE EVACUATOR (MISCELLANEOUS) IMPLANT
SAW OSC TIP CART 19.5X105X1.3 (SAW) ×1 IMPLANT
SEALER BIPOLAR AQUA 6.0 (INSTRUMENTS) ×1 IMPLANT
SET HNDPC FAN SPRY TIP SCT (DISPOSABLE) ×1 IMPLANT
SOLUTION PRONTOSAN WOUND 350ML (IRRIGATION / IRRIGATOR) ×1 IMPLANT
SPIKE FLUID TRANSFER (MISCELLANEOUS) ×1 IMPLANT
SUT ETHILON 3 0 FSL (SUTURE) IMPLANT
SUT MNCRL AB 3-0 PS2 18 (SUTURE) ×1 IMPLANT
SUT MON AB 2-0 CT1 36 (SUTURE) ×1 IMPLANT
SUT STRATAFIX PDO 1 14 VIOLET (SUTURE) ×1
SUT STRATFX PDO 1 14 VIOLET (SUTURE) ×1
SUT VIC AB 2-0 CT1 27 (SUTURE)
SUT VIC AB 2-0 CT1 TAPERPNT 27 (SUTURE) IMPLANT
SUTURE STRATFX PDO 1 14 VIOLET (SUTURE) ×1 IMPLANT
SYR 3ML LL SCALE MARK (SYRINGE) ×1 IMPLANT
TRAY FOLEY MTR SLVR 16FR STAT (SET/KITS/TRAYS/PACK) IMPLANT
TUBE SUCTION HIGH CAP CLEAR NV (SUCTIONS) ×1 IMPLANT
WATER STERILE IRR 1000ML POUR (IV SOLUTION) ×1 IMPLANT

## 2022-08-24 NOTE — Progress Notes (Signed)
PROGRESS NOTE    [name].  PZW:[id number] DOB: 1951-08-11 DOA: 08/23/2022 PCP: [name], MD    Brief Narrative:  71 year old gentleman with history of orthostatic hypotension, hyperlipidemia, type 2 diabetes, chronic diastolic heart failure who had left hip fracture repaired on 11/3, prosthetic hip dislocation 11/10 brought to the emergency room for several days of difficulty bearing weight on the left leg and found to have prosthetic left hip dislocation.  Failed conservative management.  Admitted for ORIF.   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 Lyla Glassing.  Postop management plan as per surgery.  Type 2 diabetes, uncontrolled with hyperglycemia: On glipizide at home.  Currently remains on sliding scale insulin.  History of DVT: Previously on Eliquis.  Currently not taking.  Will need prophylaxis.  Chronic medical issues including Essential hypertension with orthostatic hypotension Chronic heart failure with preserved ejection fraction Hyperlipidemia Stable on home regimen.   DVT prophylaxis: enoxaparin (LOVENOX) injection 40 mg Start: 08/23/22 1800   Code Status: Full code Family Communication: None at the bedside Disposition Plan: Status is: Inpatient Remains inpatient appropriate because: Inpatient surgery planned     Consultants:  Orthopedics  Procedures:  ORIF, scheduled today  Antimicrobials:  Perioperative   Subjective: Patient seen in the morning rounds.  Denies any complaints.  Looking forward for surgery.  Objective: Vitals:   08/23/22 1905 08/23/22 1935 08/24/22 0346 08/24/22 0948  BP:  134/76 (!) 157/99 (!) 143/74  Pulse:  (!) 107 (!) 110 97  Resp:  '18 18 18  '$ Temp:  98.2 F (36.8 C) 98.3 F (36.8 C) 99.9 F (37.7 C)  TempSrc:  Oral Oral Oral  SpO2:  98% 94% 95%  Weight: 79.9 kg     Height: '6\' 5"'$  (1.956 m)        Intake/Output Summary (Last 24 hours) at 08/24/2022 1126 Last data filed at 08/24/2022 0600 Gross per 24 hour  Intake 1856.08 ml  Output 500 ml  Net 1356.08 ml   Filed Weights   08/23/22 1400 08/23/22 1905  Weight: 88.5 kg 79.9 kg    Examination:  General exam: Appears calm and comfortable  Respiratory system: Clear to auscultation. Respiratory effort normal. Cardiovascular system: S1 & S2 heard, RRR. No pedal edema. Gastrointestinal system: Abdomen is nondistended, soft and nontender. No organomegaly or masses felt. Normal bowel sounds heard. Central nervous system: Alert and oriented. No focal neurological deficits. Extremities: Symmetric 5 x 5 power. Deformity and external rotation of the left lower extremity.  Distal neurovascular status intact.    Data Reviewed: I have personally reviewed following labs and imaging studies  CBC: Recent Labs  Lab 08/23/22 1250 08/24/22 0336  WBC 7.6 7.9  NEUTROABS 5.4  --   HGB 10.8* 9.7*  HCT 34.5* 31.3*  MCV 96.9 98.7  PLT 195 423   Basic Metabolic Panel: Recent Labs  Lab 08/23/22 1250 08/24/22 0336  NA 138 137  K 3.7 4.1  CL 102 104  CO2 25 25  GLUCOSE 420* 327*  BUN 16 22  CREATININE 0.75 0.61  CALCIUM 8.8* 8.9   GFR: Estimated Creatinine Clearance: 95.7 mL/min (by C-G formula based on SCr of 0.61 mg/dL). Liver Function Tests: Recent Labs  Lab 08/24/22 0336  AST 11*  ALT 13  ALKPHOS 111  BILITOT 0.9  PROT 5.9*  ALBUMIN 2.8*   No results for input(s): "LIPASE", "AMYLASE" in the last 168 hours.  No results for input(s): "AMMONIA" in the last 168 hours. Coagulation Profile: No results for input(s): "INR", "PROTIME" in the last 168 hours. Cardiac Enzymes: No results for input(s): "CKTOTAL", "CKMB", "CKMBINDEX", "TROPONINI" in the last 168 hours. BNP (last 3 results) No results for input(s): "PROBNP" in the last 8760 hours. HbA1C: No results for input(s): "HGBA1C" in the last 72 hours. CBG: Recent Labs   Lab 08/23/22 1658 08/23/22 2049 08/24/22 0747 08/24/22 1022  GLUCAP 353* 292* 287* 219*   Lipid Profile: No results for input(s): "CHOL", "HDL", "LDLCALC", "TRIG", "CHOLHDL", "LDLDIRECT" in the last 72 hours. Thyroid Function Tests: No results for input(s): "TSH", "T4TOTAL", "FREET4", "T3FREE", "THYROIDAB" in the last 72 hours. Anemia Panel: No results for input(s): "VITAMINB12", "FOLATE", "FERRITIN", "TIBC", "IRON", "RETICCTPCT" in the last 72 hours. Sepsis Labs: No results for input(s): "PROCALCITON", "LATICACIDVEN" in the last 168 hours.  Recent Results (from the past 240 hour(s))  Surgical PCR screen     Status: Abnormal   Collection Time: 08/24/22 12:16 AM   Specimen: Nasal Mucosa; Nasal Swab  Result Value Ref Range Status   MRSA, PCR POSITIVE (A) NEGATIVE Final   Staphylococcus aureus POSITIVE (A) NEGATIVE Final    Comment: (NOTE) The Xpert SA Assay (FDA approved for NASAL specimens in patients 38 years of age and older), is one component of a comprehensive surveillance program. It is not intended to diagnose infection nor to guide or monitor treatment. Performed at [facility], [place] [address], [place], [place] [address]          Radiology Studies: DG Hip Unilat With Pelvis 2-3 Views Left  Result Date: 08/23/2022 CLINICAL DATA:  Left hip pain and dislocation. EXAM: DG HIP (WITH OR WITHOUT PELVIS) 2-3V LEFT COMPARISON:  July 27, 2022. FINDINGS: Status post left total hip arthroplasty. 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 No definite fracture is noted. 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 Electronically Signed   By: Marijo Conception M.D.   On: 08/23/2022 13:59        Scheduled Meds:  [MAR Hold] aspirin EC  325 mg Oral Daily   chlorhexidine  60 mL Topical Once   [MAR Hold] enoxaparin (LOVENOX) injection  40 mg Subcutaneous Once   [MAR Hold] fludrocortisone  0.2 mg Oral Daily   [MAR  Hold] insulin aspart  0-15 Units Subcutaneous TID WC   [MAR Hold] insulin aspart  0-5 Units Subcutaneous QHS   [MAR Hold] midodrine  2.5 mg Oral TID WC   povidone-iodine  2 Application Topical Once   Continuous Infusions:  sodium chloride 75 mL/hr at 08/23/22 1851   lactated ringers 50 mL/hr at 08/24/22 1027   tranexamic acid     vancomycin 1,000 mg (08/24/22 1028)     LOS: 0 days    Time spent: 35 minutes    Barb Merino, MD [facility] Pager [phone]

## 2022-08-24 NOTE — Progress Notes (Signed)
Orthopedic Tech Progress Note Patient Details:  Kamdon Reisig. [birth year] [id number] Large left hip abduction brace has been ordered from [facility].  Patient ID: [name]., male   DOB: September 14, 1951, 71 y.o.   MRN: [id number]  Jearld Lesch 08/24/2022, 4:19 PM

## 2022-08-24 NOTE — Transfer of Care (Signed)
Immediate Anesthesia Transfer of Care Note  Patient: Micheal Moore.  Procedure(s) Performed: OPEN REDUCTION, HEAD AND LINER EXCHANGE (Left: Hip)  Patient Location: PACU  Anesthesia Type:General  Level of Consciousness: awake, alert , and oriented  Airway & Oxygen Therapy: Patient Spontanous Breathing and Patient connected to face mask oxygen  Post-op Assessment: Report given to RN and Post -op Vital signs reviewed and stable  Post vital signs: Reviewed and stable  Last Vitals:  Vitals Value Taken Time  BP 112/53 08/24/22 1400  Temp    Pulse 89 08/24/22 1401  Resp    SpO2 93 % 08/24/22 1401  Vitals shown include unvalidated device data.  Last Pain:  Vitals:   08/24/22 0948  TempSrc: Oral  PainSc:       Patients Stated Pain Goal: 0 (74/45/14 6047)  Complications: No notable events documented.

## 2022-08-24 NOTE — Inpatient Diabetes Management (Signed)
Inpatient Diabetes Program Recommendations  AACE/ADA: New Consensus Statement on Inpatient Glycemic Control (2015)  Target Ranges:  Prepandial:   less than 140 mg/dL      Peak postprandial:   less than 180 mg/dL (1-2 hours)      Critically ill patients:  140 - 180 mg/dL   Lab Results  Component Value Date   GLUCAP 187 (H) 08/24/2022   HGBA1C 7.2 (H) 02/22/2022    Review of Glycemic Control  Diabetes history: DM2 Outpatient Diabetes medications: Amaryl 2 mg QD, Jardiance 10 mg QD (not taking) Current orders for Inpatient glycemic control: Novolog 0-15 units TID with meals and 0-5 HS  HgbA1C - 7.2% CBGs 287,219, 206, 209, 187 Eating 100%  Inpatient Diabetes Program Recommendations:    If FBS> 180 mg/dL, consider adding small amount of basal insulin.  Will follow.  Thank you. Lorenda Peck, RD, LDN, [facility] Inpatient Diabetes Coordinator [phone]

## 2022-08-24 NOTE — Discharge Instructions (Signed)
 Dr. Brian Swinteck Joint Replacement Specialist [facility] [address] [place], [place] [address] [phone]   TOTAL HIP REPLACEMENT POSTOPERATIVE DIRECTIONS    Hip Rehabilitation, Guidelines Following Surgery   WEIGHT BEARING Weight bearing as tolerated with assist device (walker, cane, etc) as directed, use it as long as suggested by your surgeon or therapist, typically at least 4-6 weeks.  The results of a hip operation are greatly improved after range of motion and muscle strengthening exercises. Follow all safety measures which are given to protect your hip. If any of these exercises cause increased pain or swelling in your joint, decrease the amount until you are comfortable again. Then slowly increase the exercises. Call your caregiver if you have problems or questions.   HOME CARE INSTRUCTIONS  Most of the following instructions are designed to prevent the dislocation of your new hip.  Remove items at home which could result in a fall. This includes throw rugs or furniture in walking pathways.  Continue medications as instructed at time of discharge. You may have some home medications which will be placed on hold until you complete the course of blood thinner medication. You may start showering once you are discharged home. Do not remove your dressing. Do not put on socks or shoes without following the instructions of your caregivers.   Sit on chairs with arms. Use the chair arms to help push yourself up when arising.  Arrange for the use of a toilet seat elevator so you are not sitting low.  Walk with walker as instructed.  You may resume a sexual relationship in one month or when given the OK by your caregiver.  Use walker as long as suggested by your caregivers.  You may put full weight on your legs and walk as much as is comfortable. Avoid periods of inactivity such as sitting longer than an hour when not asleep. This helps prevent blood  clots.  You may return to work once you are cleared by your surgeon.  Do not drive a car for 6 weeks or until released by your surgeon.  Do not drive while taking narcotics.  Wear elastic stockings for two weeks following surgery during the day but you may remove then at night.  Make sure you keep all of your appointments after your operation with all of your doctors and caregivers. You should call the office at the above phone number and make an appointment for approximately two weeks after the date of your surgery. Please pick up a stool softener and laxative for home use as long as you are requiring pain medications. ICE to the affected hip every three hours for 30 minutes at a time and then as needed for pain and swelling. Continue to use ice on the hip for pain and swelling from surgery. You may notice swelling that will progress down to the foot and ankle.  This is normal after surgery.  Elevate the leg when you are not up walking on it.   It is important for you to complete the blood thinner medication as prescribed by your doctor. Continue to use the breathing machine which will help keep your temperature down.  It is common for your temperature to cycle up and down following surgery, especially at night when you are not up moving around and exerting yourself.  The breathing machine keeps your lungs expanded and your temperature down.  RANGE OF MOTION AND STRENGTHENING EXERCISES  These exercises are designed to help you   keep full movement of your hip joint. Follow your caregiver's or physical therapist's instructions. Perform all exercises about fifteen times, three times per day or as directed. Exercise both hips, even if you have had only one joint replacement. These exercises can be done on a training (exercise) mat, on the floor, on a table or on a bed. Use whatever works the best and is most comfortable for you. Use music or television while you are exercising so that the exercises are a  pleasant break in your day. This will make your life better with the exercises acting as a break in routine you can look forward to.  ?Lying on your back, slowly slide your foot toward your buttocks, raising your knee up off the floor. Then slowly slide your foot back down until your leg is straight again.  ?Lying on your back spread your legs as far apart as you can without causing discomfort.  ?Lying on your side, raise your upper leg and foot straight up from the floor as far as is comfortable. Slowly lower the leg and repeat.  ?Lying on your back, tighten up the muscle in the front of your thigh (quadriceps muscles). You can do this by keeping your leg straight and trying to raise your heel off the floor. This helps strengthen the largest muscle supporting your knee.  ?Lying on your back, tighten up the muscles of your buttocks both with the legs straight and with the knee bent at a comfortable angle while keeping your heel on the floor.  ? ?SKILLED REHAB INSTRUCTIONS: ?If the patient is transferred to a skilled rehab facility following release from the hospital, a list of the current medications will be sent to the facility for the patient to continue.  When discharged from the skilled rehab facility, please have the facility set up the patient's Home Health Physical Therapy prior to being released. Also, the skilled facility will be responsible for providing the patient with their medications at time of release from the facility to include their pain medication and their blood thinner medication. If the patient is still at the rehab facility at time of the two week follow up appointment, the skilled rehab facility will also need to assist the patient in arranging follow up appointment in our office and any transportation needs. ? ?POST-OPERATIVE OPIOID TAPER INSTRUCTIONS: ?It is important to wean off of your opioid medication as soon as possible. If you do not need pain medication after your surgery it is ok  to stop day one. ?Opioids include: ?Codeine, Hydrocodone(Norco, Vicodin), Oxycodone(Percocet, oxycontin) and hydromorphone amongst others.  ?Long term and even short term use of opiods can cause: ?Increased pain response ?Dependence ?Constipation ?Depression ?Respiratory depression ?And more.  ?Withdrawal symptoms can include ?Flu like symptoms ?Nausea, vomiting ?And more ?Techniques to manage these symptoms ?Hydrate well ?Eat regular healthy meals ?Stay active ?Use relaxation techniques(deep breathing, meditating, yoga) ?Do Not substitute Alcohol to help with tapering ?If you have been on opioids for less than two weeks and do not have pain than it is ok to stop all together.  ?Plan to wean off of opioids ?This plan should start within one week post op of your joint replacement. ?Maintain the same interval or time between taking each dose and first decrease the dose.  ?Cut the total daily intake of opioids by one tablet each day ?Next start to increase the time between doses. ?The last dose that should be eliminated is the evening dose.  ? ? ?MAKE   SURE YOU:  Understand these instructions.  Will watch your condition.  Will get help right away if you are not doing well or get worse.  Pick up stool softner and laxative for home use following surgery while on pain medications. Do not remove your dressing. The dressing is waterproof--it is OK to take showers. Continue to use ice for pain and swelling after surgery. Do not use any lotions or creams on the incision until instructed by your surgeon. Total Hip Protocol. Posterior hip precautions.  Hip abduction brace.

## 2022-08-24 NOTE — Interval H&P Note (Signed)
History and Physical Interval Note:  08/24/2022 11:09 AM  [name].  has presented today for surgery, with the diagnosis of left hip fracture.  The various methods of treatment have been discussed with the patient and family. After consideration of risks, benefits and other options for treatment, the patient has consented to  Procedure(s): OPEN REDUCTION, HEAD AND LINER EXCHANGE (Left) as a surgical intervention.  The patient's history has been reviewed, patient examined, no change in status, stable for surgery.  I have reviewed the patient's chart and labs.  Questions were answered to the patient's satisfaction.    The risks, benefits, and alternatives were discussed with the patient. There are risks associated with the surgery including, but not limited to, problems with anesthesia (death), infection, instability (giving out of the joint), dislocation, differences in leg length/angulation/rotation, fracture of bones, loosening or failure of implants, hematoma (blood accumulation) which may require surgical drainage, blood clots, pulmonary embolism, nerve injury (foot drop and lateral thigh numbness), and blood vessel injury. The patient understands these risks and elects to proceed.    Hilton Cork Channon Brougher

## 2022-08-24 NOTE — TOC Initial Note (Signed)
Transition of Care (TOC) - Initial/Assessment Note   Patient Details  Name: Micheal Moore. MRN: [id number] Date of Birth: [birth year]  Transition of Care [facility]) CM/SW Contact:    Sherie Don, LCSW Phone Number: 08/24/2022, 3:39 PM  Clinical Narrative: [facility] consulted for hospital bed. CSW spoke with wife regarding DME request. Per wife, patient has been bed bound for about 2 months and is not able to ambulate at home and will need a hospital bed to make his care easier to provide and to prevent further skin breakdown. Wife reported she plans to take the patient home even if SNF is recommended as she would prefer to care for him at home due to a recent poor experience with SNF.  CSW made referral to [facility] with [facility] for hospital bed. DME order for hospital bed placed. Narrative completed. [facility] to deliver bed to home once it is approved by insurance and wife has made space in the home for the bed.  Expected Discharge Plan: Home/Self Care Barriers to Discharge: Continued Medical Work up  Patient Goals and CMS Choice Patient states their goals for this hospitalization and ongoing recovery are:: Discharge home with hospital bed CMS Medicare.gov Compare Post Acute Care list provided to:: Patient Represenative (must comment) Sydnee Cabal (spouse)) Choice offered to / list presented to : Patient, Spouse  Expected Discharge Plan and Services Expected Discharge Plan: Home/Self Care In-house Referral: Clinical Social Work Post Acute Care Choice: Durable Medical Equipment Living arrangements for the past 2 months: Single Family Home              DME Arranged: Hospital bed, Gel overlay DME Agency: [facility] Date DME Agency Contacted: 08/24/22 Time DME Agency Contacted: 53 Representative spoke with at DME Agency: Erasmo Downer  Prior Living Arrangements/Services Living arrangements for the past 2 months: [place] with:: Spouse Patient language and need for  interpreter reviewed:: Yes Do you feel safe going back to the place where you live?: Yes      Need for Family Participation in Patient Care: Yes (Comment) Care giver support system in place?: Yes (comment) Criminal Activity/Legal Involvement Pertinent to Current Situation/Hospitalization: No - Comment as needed  Activities of Daily Living Home Assistive Devices/Equipment: Eyeglasses, Grab bars in shower, Walker (specify type), Shower chair without back ADL Screening (condition at time of admission) Patient's cognitive ability adequate to safely complete daily activities?: Yes Is the patient deaf or have difficulty hearing?: No Does the patient have difficulty seeing, even when wearing glasses/contacts?: No Does the patient have difficulty concentrating, remembering, or making decisions?: No Patient able to express need for assistance with ADLs?: Yes Does the patient have difficulty dressing or bathing?: Yes Independently performs ADLs?: No Communication: Independent Dressing (OT): Needs assistance Is this a change from baseline?: Change from baseline, expected to last <3days Grooming: Needs assistance Is this a change from baseline?: Change from baseline, expected to last <3 days Feeding: Independent Bathing: Needs assistance Is this a change from baseline?: Change from baseline, expected to last <3 days Toileting: Needs assistance Is this a change from baseline?: Change from baseline, expected to last <3 days In/Out Bed: Needs assistance Is this a change from baseline?: Change from baseline, expected to last <3 days Walks in Home: Needs assistance Is this a change from baseline?: Change from baseline, expected to last <3 days Does the patient have difficulty walking or climbing stairs?: Yes Weakness of Legs: Left Weakness of Arms/Hands: None  Permission Sought/Granted Permission sought to share  information with : Other (comment) Permission granted to share information with : Yes,  Verbal Permission Granted Permission granted to share info w AGENCY: [facility]  Emotional Assessment Orientation: : Oriented to Self, Oriented to Place, Oriented to  Time, Oriented to Situation Alcohol / Substance Use: Not Applicable Psych Involvement: No (comment)  Admission diagnosis:  Closed dislocation of left hip ([place]) [S73.005A] Dislocation of left hip, initial encounter ([place]) [S73.005A] Anterior dislocation of left hip ([place]) [S73.035A] Patient Active Problem List   Diagnosis Date Noted   Anterior dislocation of left hip ([place]) 08/24/2022   Closed dislocation of left hip ([place]) 08/23/2022   Normocytic anemia 08/23/2022   Malnutrition of moderate degree 07/20/2022   Fall    Orthostatic hypotension    Hip fracture ([place]) 02/22/2022   Hyperlipidemia 02/22/2022   Chronic diastolic CHF (congestive heart failure) ([place]) 02/22/2022   Left great toe amputee ([place]) 05/06/2020   Cellulitis in diabetic foot ([place]) 04/27/2020   Osteomyelitis of great toe of left foot ([place]) 04/27/2020   Type 2 diabetes mellitus with hyperlipidemia ([place]) 04/27/2020   History of DVT (deep vein thrombosis) 04/27/2020   Tobacco abuse 04/27/2020   PCP:  [name], MD Pharmacy:   [facility] ([place]), [place] - [address] [place]) [place] [address] Phone: [phone] Fax: [phone]  Social Determinants of Health ([place]) Interventions    Readmission Risk Interventions    07/25/2022    2:42 PM  Readmission Risk Prevention Plan  Transportation Screening   PCP or Specialist Appt within 5-7 [place] Screening   Medication Review (RN CM)      Information is confidential and restricted. Go to Review Flowsheets to unlock data.

## 2022-08-24 NOTE — Op Note (Signed)
OPERATIVE REPORT   08/24/2022  1:44 PM  PATIENT:  Micheal Moore.   SURGEON:  Bertram Savin, MD  ASSISTANT:  Larene Pickett, PA-C.   PREOPERATIVE DIAGNOSIS: Subacute left hip periprosthetic joint dislocation.  POSTOPERATIVE DIAGNOSIS:  Same.  PROCEDURE:  Revision left total hip arthroplasty, head ball exchange.  ANESTHESIA:   GETA.  ANTIBIOTICS: 2 g Ancef. 1 g vancomycin.  IMPLANTS: Biomet metal head ball, 36+3 mm.  EXPLANTS: Biomet Biolox ceramic head ball, 36-3 mm.  SPECIMENS: None.  TUBES AND DRAINS: 1. 10 mm flat JP drain in subcutaneous tissue. 2.  Prevena negative pressure dressing at 75 mmHg.  COMPLICATIONS: None.  DISPOSITION: Stable to PACU.  SURGICAL INDICATIONS:  Micheal Moore. is a 71 y.o. male who underwent primary left total hip arthroplasty for displaced femoral neck fracture by myself on 07/20/2022.  He was doing well postoperatively until he sustained a fall on November 10, which resulted in periprosthetic dislocation of the left hip.  He came to the emergency department at [facility], and he underwent successful closed reduction of the left hip by emergency department staff.  I then saw him in the office on November 17, and x-rays revealed that the hip was concentrically reduced.  Patient came to the ER yesterday with several days of inability to weight-bear.  He did not recall exactly how many days it had been since he was able to walk.  He was found to have a second occurrence of left hip dislocation.  Conscious sedation was performed by emergency department staff, and I attempted a closed reduction, which was unsuccessful.  He was then admitted by the hospitalist service for perioperative restratification medical optimization.  He was indicated for open reduction, head ball and liner exchange.  The risks, benefits, and alternatives were discussed with the patient. There are risks associated with the surgery including, but not limited  to, problems with anesthesia (death), infection, instability (giving out of the joint), dislocation, differences in leg length/angulation/rotation, fracture of bones, loosening or failure of implants, hematoma (blood accumulation) which may require surgical drainage, blood clots, pulmonary embolism, nerve injury (foot drop and lateral thigh numbness), and blood vessel injury. The patient understands these risks and elects to proceed.  PROCEDURE IN DETAIL: The patient was identified in the holding area using 2 identifiers.  The surgical site marked by myself.  Patient was taken to the operating room, and general anesthesia was induced on the stretcher.  He was then transferred to the [facility] table.  The left lower extremity was prepped and draped in the normal sterile surgical fashion.  Timeout was called, verifying site and site of surgery.  Patient did receive IV antibiotics within 60 minutes of beginning the procedure.  I began by using a #10 blade to sharply excise his previous bikini incision.  A fresh 10 blade was used to reenter the fascia over the TFL.  Heuter interval was utilized.  The hip was found to be dislocated posteriorly.  Joint fluid was serous and without evidence of infection.  Traction was applied, and we were unable to obtain enough length.  Retractors were inserted, and I performed a 360 degree capsulectomy around the acetabulum.  The liner was intact without any damage.  The acetabular component was well-fixed.  At this point, we were able to reduce the hip.  The hip was then dislocated gently with a bone hook, and I removed the ceramic head ball.  The trunnion was inspected and found to be  intact.  I sequentially trialed up to a 36+3 mm head ball.  Stability testing was performed and found to be excellent.  With 100 degrees of external rotation and full extension to the floor, there was no impingement or dislocation.  I remove the boot from the leg spar, and with the hip in neutral abduction  and 90 degrees of flexion, I was unable to dislocate or impinge the hip with up to 80 degrees of internal rotation.  The hip was then dislocated, and the real 36+3 mm metal head ball was impacted onto the trunnion.  The hip was reduced.    The wound was then copiously irrigated with Prontosan solution followed by normal saline with pulsatile lavage.  1 g of vancomycin powder was added to the deep hip joint.  The fascia was closed with #1 strata fix suture.  Through a separate stab incision over the distal lateral thigh, I inserted a 10 mm flat perforated JP drain into the subcutaneous tissue.  Deep dermal layer was closed with 2-0 interrupted Monocryl.  Skin was reapproximated with staples.  A customizable Prevena dressing was applied and hooked up to suction at 75 mmHg without any leak.  The patient was then extubated and taken to the PACU in stable condition.  Sponge, needle, and instrument counts were correct at the end of the case x 2.  There were no known complications.  POSTOPERATIVE PLAN: Postoperatively, the patient be readmitted to the hospitalist service.  He may weight-bear as tolerated left lower extremity with a walker.  Posterior hip precautions left lower extremity.  I have contacted [facility] to order a left hip abduction brace.  Begin aspirin for DVT prophylaxis.  Mobilize out of bed with PT/OT, once the brace has arrived.  Discontinue JP drain when output is lees than 10 cc/shift. Upon discharge, house VAC suction unit will need to be exchanged for portable Prevena suction unit.  He will follow-up within 7 days of discharge for removal of negative pressure dressing.

## 2022-08-24 NOTE — Care Management (Signed)
Patient has a closed dislocation of left hip and is bed bound, which requires his hips and legs to be positioned in ways not feasible with a normal bed. Patient requires frequent and immediate changes in body position and to prevent further skin breakdown of back and buttocks, which cannot be achieved with a normal bed.      Durable Medical Equipment  (From admission, onward)           Start     Ordered   08/24/22 1448  For home use only DME Hospital bed  Once       Question Answer Comment  Length of Need Lifetime   The above medical condition requires: Patient requires the ability to reposition immediately   Head must be elevated greater than: 30 degrees   Bed type Semi-electric   Support Surface: Gel Overlay      08/24/22 1448

## 2022-08-24 NOTE — Anesthesia Preprocedure Evaluation (Addendum)
Anesthesia Evaluation  Patient identified by MRN, date of birth, ID band Patient awake    Reviewed: Allergy & Precautions, NPO status , Patient's Chart, lab work & pertinent test results, reviewed documented beta blocker date and time   Airway Mallampati: II  TM Distance: >3 FB Neck ROM: Full    Dental  (+) Chipped, Missing, Poor Dentition, Dental Advisory Given,    Pulmonary Current Smoker and Patient abstained from smoking.   Pulmonary exam normal breath sounds clear to auscultation       Cardiovascular +CHF and + DVT  Normal cardiovascular exam Rhythm:Regular Rate:Normal  TTE 2023  1. Left ventricular ejection fraction, by estimation, is 50 to 55%. The  left ventricle has low normal function. Left ventricular endocardial  border not optimally defined to evaluate regional wall motion. Left  ventricular diastolic parameters are  consistent with Grade I diastolic dysfunction (impaired relaxation).   2. Right ventricular systolic function is normal. The right ventricular  size is normal.   3. The mitral valve is grossly normal. Trivial mitral valve  regurgitation.   4. The aortic valve is tricuspid. There is mild calcification of the  aortic valve. Aortic valve regurgitation is not visualized. Aortic valve  sclerosis/calcification is present, without any evidence of aortic  stenosis.   5. Aortic dilatation noted. There is borderline dilatation of the aortic  root, measuring 38 mm. There is borderline dilatation of the ascending  aorta, measuring 38 mm.   6. The inferior vena cava is normal in size with greater than 50%  respiratory variability, suggesting right atrial pressure of 3 mmHg.     Neuro/Psych negative neurological ROS  negative psych ROS   GI/Hepatic negative GI ROS, Neg liver ROS,,,  Endo/Other  diabetes, Type 2, Oral Hypoglycemic Agents    Renal/GU negative Renal ROS  negative genitourinary    Musculoskeletal negative musculoskeletal ROS (+)    Abdominal   Peds  Hematology  (+) Blood dyscrasia, anemia Lab Results      Component                Value               Date                      WBC                      7.9                 08/24/2022                HGB                      9.7 (L)             08/24/2022                HCT                      31.3 (L)            08/24/2022                MCV                      98.7                08/24/2022  PLT                      152                 08/24/2022              Anesthesia Other Findings   Reproductive/Obstetrics                             Anesthesia Physical Anesthesia Plan  ASA: 3  Anesthesia Plan: General   Post-op Pain Management: Tylenol PO (pre-op)*   Induction: Intravenous  PONV Risk Score and Plan: 1 and Midazolam, Dexamethasone and Ondansetron  Airway Management Planned: Oral ETT and LMA  Additional Equipment:   Intra-op Plan:   Post-operative Plan: Extubation in OR  Informed Consent: I have reviewed the patients History and Physical, chart, labs and discussed the procedure including the risks, benefits and alternatives for the proposed anesthesia with the patient or authorized representative who has indicated his/her understanding and acceptance.     Dental advisory given  Plan Discussed with: CRNA  Anesthesia Plan Comments:        Anesthesia Quick Evaluation

## 2022-08-24 NOTE — Anesthesia Procedure Notes (Signed)
Procedure Name: LMA Insertion Date/Time: 08/24/2022 11:32 AM  Performed by: [place] Manus Rudd, CRNAPre-anesthesia Checklist: Patient identified, Emergency Drugs available, Suction available and Patient being monitored Patient Re-evaluated:Patient Re-evaluated prior to induction Oxygen Delivery Method: Circle system utilized Preoxygenation: Pre-oxygenation with 100% oxygen Induction Type: IV induction Ventilation: Mask ventilation without difficulty LMA: LMA inserted LMA Size: 4.0 Number of attempts: 1 Airway Equipment and Method: Bite block Placement Confirmation: positive ETCO2 Tube secured with: Tape Dental Injury: Teeth and Oropharynx as per pre-operative assessment

## 2022-08-24 NOTE — Care Management (Signed)
    Durable Medical Equipment  (From admission, onward)           Start     Ordered   08/24/22 1448  For home use only DME Hospital bed  Once       Question Answer Comment  Length of Need Lifetime   The above medical condition requires: Patient requires the ability to reposition immediately   Head must be elevated greater than: 30 degrees   Bed type Semi-electric   Support Surface: Gel Overlay      08/24/22 1448

## 2022-08-25 DIAGNOSIS — E7800 Pure hypercholesterolemia, unspecified: Secondary | ICD-10-CM | POA: Diagnosis not present

## 2022-08-25 DIAGNOSIS — S73035A Other anterior dislocation of left hip, initial encounter: Secondary | ICD-10-CM | POA: Diagnosis not present

## 2022-08-25 DIAGNOSIS — E1169 Type 2 diabetes mellitus with other specified complication: Secondary | ICD-10-CM | POA: Diagnosis not present

## 2022-08-25 DIAGNOSIS — Z86718 Personal history of other venous thrombosis and embolism: Secondary | ICD-10-CM | POA: Diagnosis not present

## 2022-08-25 LAB — GLUCOSE, CAPILLARY
Glucose-Capillary: 237 mg/dL — ABNORMAL HIGH (ref 70–99)
Glucose-Capillary: 306 mg/dL — ABNORMAL HIGH (ref 70–99)
Glucose-Capillary: 302 mg/dL — ABNORMAL HIGH (ref 70–99)
Glucose-Capillary: 244 mg/dL — ABNORMAL HIGH (ref 70–99)

## 2022-08-25 LAB — HEMOGLOBIN A1C
Hgb A1c MFr Bld: 9.3 % — ABNORMAL HIGH (ref 4.8–5.6)
Mean Plasma Glucose: 220 mg/dL

## 2022-08-25 MED ORDER — MUPIROCIN 2 % EX OINT
1.0000 | TOPICAL_OINTMENT | Freq: Two times a day (BID) | CUTANEOUS | Status: DC
  Administered 2022-08-25 – 2022-08-28 (×7): 1 via NASAL
  Filled 2022-08-25: qty 22

## 2022-08-25 NOTE — Plan of Care (Signed)
Plan of care reviewed and discussed. °

## 2022-08-25 NOTE — Evaluation (Signed)
Physical Therapy Evaluation Patient Details Name: Anis Degidio. MRN: [id number] DOB: 1951-08-30 Today's Date: 08/25/2022  History of Present Illness  Pt is 71 yo male admitted 08/23/22 with prosthetic L hip dislocation.  Pt with initial L hip fx 11/3 anterior hemiarthroplasty, fall on 11/10 with periprosthetic dislocation L hip that was closed reduced in ED.  Pt returned to ED 12/7 with second dislocation and is now s/p revision of L THA on 08/24/22.  Pt with other hx including orthostatic hypotension, HLD, DM2, CHF, R hip fx with IM nail 6/23.  Clinical Impression  Pt admitted with above diagnosis. Pt was poor historian but able to obtain PLOF from chart.  Pt has had R hip fracture 6/23 and L hip fx 07/20/22.  Prior to both he was ambulatory and independent per chart.  Since last fx pt did ambulate while in hospital but per chart wife reports largely bed bound once home.  Today, pt presenting with some confusion (unable to recall PLOF and needing frequent cues).  He now has posterior hip precautions and required mod-max x 2 for bed mobility and to stand at EOB.  Pt unable to take any steps or pivot.  Do strongly recommend SNF at d/c, but noted per [facility] note wife states will decline SNF due to bad prior experience.  If home will need strong assist, hospital bed, hoyer, and w/c.  Also, noting pt with rash throughout trunk and groin.  Pt already with compromised skin (rash and breakdown) groin and upper thigh, so hip ABD brace will need close monitoring.  Pt currently with functional limitations due to the deficits listed below (see PT Problem List). Pt will benefit from skilled PT to increase their independence and safety with mobility to allow discharge to the venue listed below.          Recommendations for follow up therapy are one component of a multi-disciplinary discharge planning process, led by the attending physician.  Recommendations may be updated based on patient status, additional  functional criteria and insurance authorization.  Follow Up Recommendations Skilled nursing-short term rehab (<3 hours/day) (per chart wife declines SNF -will need max HH services if returning home) Can patient physically be transported by private vehicle: No    Assistance Recommended at Discharge Frequent or constant Supervision/Assistance  Patient can return home with the following  Two people to help with walking and/or transfers;Two people to help with bathing/dressing/bathroom;Help with stairs or ramp for entrance;Assistance with Forensic psychologist cushion (measurements PT);BSC/3in1;Wheelchair (measurements PT);Hospital bed (hoyer lift)  Recommendations for Other Services       Functional Status Assessment Patient has had a recent decline in their functional status and demonstrates the ability to make significant improvements in function in a reasonable and predictable amount of time.     Precautions / Restrictions Precautions Precautions: Fall;Posterior Hip Precaution Booklet Issued: Yes (comment) (posterior prec handout) Precaution Comments: hx orthostatic hypotension Required Braces or Orthoses: Other Brace Other Brace: Hip ABD brace at all times Restrictions Weight Bearing Restrictions: No LLE Weight Bearing: Weight bearing as tolerated      Mobility  Bed Mobility Overal bed mobility: Needs Assistance Bed Mobility: Supine to Sit, Sit to Supine     Supine to sit: +2 for physical assistance, Mod assist, HOB elevated Sit to supine: Max assist, +2 for physical assistance   General bed mobility comments: Increased time, use of rails and bed features, frequent cues, assist for L LE and trunk to sit  and both LE and trunk to supine    Transfers Overall transfer level: Needs assistance Equipment used: Rolling walker (2 wheels) Transfers: Sit to/from Stand Sit to Stand: Mod assist, +2 physical assistance, From elevated surface            General transfer comment: Bed significantly elevated; Sit to stand x 4 - pt mostly able to get upright but with dependency of legs pushing back into bed and assist from staff    Ambulation/Gait               General Gait Details: unable  Stairs            Wheelchair Mobility    Modified Rankin (Stroke Patients Only)       Balance Overall balance assessment: Needs assistance Sitting-balance support: Feet supported, Bilateral upper extremity supported Sitting balance-[name]: Poor Sitting balance - Comments: Requirng UE support, cues to stay forward to prevent sliding and min A at times   Standing balance support: Bilateral upper extremity supported Standing balance-[name]: Poor Standing balance comment: RW and mod A of 2                             Pertinent Vitals/Pain Pain Assessment Pain Assessment: Faces Faces Pain Scale: Hurts even more Pain Location: L hip with transfers Pain Descriptors / Indicators: Grimacing, Sore, Discomfort Pain Intervention(s): Limited activity within patient's tolerance, Monitored during session, Premedicated before session    Home Living Family/patient expects to be discharged to:: Private residence Living Arrangements: Spouse/significant other Available Help at Discharge: Family;Friend(s) Type of Home: House Home Access: Stairs to enter Entrance Stairs-Rails: None Entrance Stairs-Number of Steps: 2-3 steps to porch and one into house   Home Layout: One level Home Equipment: Conservation officer, nature (2 wheels) Additional Comments: Pt poor historian, stating not sure PLOF and not sure if he has w/c at home    Prior Function               Mobility Comments: Pt poor historian so PLOF from chart review.  Per notes , pt was independent prior to RIGHT hip fracture in 6/23.  After that he went to rehab for 21 days and used RW but still mod I.  Pt then fell and fractured L hip in 07/20/22.  During  hospitalization he ambulated as much as 47' with assist using RW and went to SNF at d/c.  Per notes wife reports pt has been largely non-ambulatory and bedbound with return home. ADLs Comments: Prior to fall 07/20/22 was independent     Hand Dominance        Extremity/Trunk Assessment   Upper Extremity Assessment Upper Extremity Assessment: Overall WFL for tasks assessed    Lower Extremity Assessment Lower Extremity Assessment: LLE deficits/detail;RLE deficits/detail RLE Deficits / Details: ROM grossly WFL; MMT 3/5 but not further tested due to painful on L side with muscle activation LLE Deficits / Details: Pt in hip abduction brace.  ; ROM: within hip precautions, knee and ankle WFL; MMT: ankle 5/5, knee ext 2/5, hip 1/5    Cervical / Trunk Assessment Cervical / Trunk Assessment: Kyphotic  Communication   Communication: No difficulties  Cognition Arousal/Alertness: Awake/alert Behavior During Therapy: WFL for tasks assessed/performed Overall Cognitive Status: No family/caregiver present to determine baseline cognitive functioning  General Comments: Pt poor historian and not able to provide PLOF; required repeated instructions and easily distracted; unable to retain hip precautions        General Comments General comments (skin integrity, edema, etc.): Pt with rash entire trunk and groin.  Also, rash and break down inner thighs bil sides.  Will need careful monitioring of hip abd brace for skin breakdown due to skin already compromised.    Exercises     Assessment/Plan    PT Assessment Patient needs continued PT services  PT Problem List Decreased strength;Decreased range of motion;Decreased activity tolerance;Decreased balance;Decreased mobility;Pain;Decreased cognition;Decreased knowledge of use of DME;Decreased safety awareness;Decreased knowledge of precautions;Decreased skin integrity       PT Treatment Interventions DME  instruction;Gait training;Functional mobility training;Therapeutic activities;Therapeutic exercise;Balance training;Patient/family education;Wheelchair mobility training;Modalities;Cognitive remediation    PT Goals (Current goals can be found in the Care Plan section)  Acute Rehab PT Goals Patient Stated Goal: not able to state PT Goal Formulation: With patient Time For Goal Achievement: 09/08/22 Potential to Achieve Goals: Fair    Frequency Min 5X/week     Co-evaluation               AM-PAC PT "6 Clicks" Mobility  Outcome Measure Help needed turning from your back to your side while in a flat bed without using bedrails?: Total Help needed moving from lying on your back to sitting on the side of a flat bed without using bedrails?: Total Help needed moving to and from a bed to a chair (including a wheelchair)?: Total Help needed standing up from a chair using your arms (e.g., wheelchair or bedside chair)?: Total Help needed to walk in hospital room?: Total Help needed climbing 3-5 steps with a railing? : Total 6 Click Score: 6    End of Session Equipment Utilized During Treatment: Gait belt;Other (comment) (hip abd brace) Activity Tolerance: Patient tolerated treatment well Patient left: with call bell/phone within reach;in bed;with bed alarm set (did not place in chair due to low height and hip precautions) Nurse Communication: Mobility status;Other (comment) (skin breakdown - will need close monitoring of brace) PT Visit Diagnosis: Unsteadiness on feet (R26.81);Muscle weakness (generalized) (M62.81);Pain Pain - Right/Left: Left Pain - part of body: Hip    Time: 1030-1054 PT Time Calculation (min) (ACUTE ONLY): 24 min   Charges:   PT Evaluation $PT Eval Moderate Complexity: 1 Mod PT Treatments $Therapeutic Activity: 8-22 mins        Abran Richard, PT Acute Rehab [facility] [phone]   Karlton Lemon 08/25/2022, 12:30 PM

## 2022-08-25 NOTE — Progress Notes (Signed)
PROGRESS NOTE    Micheal Moore.  HDQ:[id number] DOB: 07/15/1951 DOA: 08/23/2022 PCP: [name], MD    Brief Narrative:  70 year old gentleman with history of orthostatic hypotension, hyperlipidemia, type 2 diabetes, chronic diastolic heart failure who had left hip fracture repaired on 11/3, prosthetic hip dislocation 11/10 brought to the emergency room for several days of difficulty bearing weight on the left leg and found to have prosthetic left hip dislocation.  Failed conservative management.  Admitted for ORIF.   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 Lyla Glassing.  Adequate IV and oral pain medications.  Hip precautions.  Started with PT OT.  Weightbearing as tolerated. DVT prophylaxis, aspirin 81 mg twice daily. Pain medications, currently on Robaxin and Vicodin along with laxatives. Wound care, wound VAC on.  Discharge plan with portable wound VAC Prevana. Other postoperative management as per surgery. Patient's wife willing to take patient home, will benefit with additional DME's at home.  Type 2 diabetes, uncontrolled with hyperglycemia: On glipizide at home.  Currently remains on sliding scale insulin.  Since she is not going home on insulin, will keep on sliding scale insulin.  History of DVT: Previously on Eliquis.  Currently not taking.  Prophylaxis with aspirin.  Chronic medical issues including Essential hypertension with orthostatic hypotension, on Florinef and midodrine.  Continue. Chronic heart failure with preserved ejection fraction Hyperlipidemia Stable on home regimen.  Start working with PT OT.  Anticipate home once pain is controlled and surgically stable.  Will need hospital bed and other support system to set up at home.   DVT prophylaxis: SCDs Start: 08/24/22 1605   Code Status: Full code Family Communication: None at the bedside Disposition Plan: Status is: Inpatient Remains  inpatient appropriate because: Immediate postop.     Consultants:  Orthopedics  Procedures:  ORIF, scheduled today  Antimicrobials:  Perioperative   Subjective: Patient seen and examined.  No overnight events.  He tells me that he feels tight on his left hip.  Now it has started hurting.  No other overnight events.  Objective: Vitals:   08/24/22 2123 08/25/22 0126 08/25/22 0602 08/25/22 1030  BP: (!) 146/72 133/64 (!) 150/76 (!) 123/98  Pulse: 93 93 (!) 102 (!) 103  Resp: '16 14 16 18  '$ Temp: 98.4 F (36.9 C) 98.6 F (37 C) 98.8 F (37.1 C) 99.2 F (37.3 C)  TempSrc: Oral Oral Oral Oral  SpO2: 100% 99% 98% 94%  Weight:      Height:        Intake/Output Summary (Last 24 hours) at 08/25/2022 1156 Last data filed at 08/25/2022 0955 Gross per 24 hour  Intake 4133.32 ml  Output 2110 ml  Net 2023.32 ml   Filed Weights   08/23/22 1400 08/23/22 1905  Weight: 88.5 kg 79.9 kg    Examination:  General exam: Appears calm and comfortable, chronically sick looking.  Not in any distress. Respiratory system: Clear to auscultation. Respiratory effort normal.  No added sounds. Cardiovascular system: S1 & S2 heard, RRR. No pedal edema. Gastrointestinal system: Abdomen is nondistended, soft and nontender. No organomegaly or masses felt. Normal bowel sounds heard. Central nervous system: Alert and oriented. No focal neurological deficits. Extremities: Symmetric 5 x 5 power. Left hip surgical incision looks clean and dry.  Postoperative dressing not removed.  Suction canister is dry.  Distal neurovascular status is intact. Left hip on immobilization left hip abduction brace.    Data Reviewed:  I have personally reviewed following labs and imaging studies  CBC: Recent Labs  Lab 08/23/22 1250 08/24/22 0336  WBC 7.6 7.9  NEUTROABS 5.4  --   HGB 10.8* 9.7*  HCT 34.5* 31.3*  MCV 96.9 98.7  PLT 195 465   Basic Metabolic Panel: Recent Labs  Lab 08/23/22 1250 08/24/22 0336   NA 138 137  K 3.7 4.1  CL 102 104  CO2 25 25  GLUCOSE 420* 327*  BUN 16 22  CREATININE 0.75 0.61  CALCIUM 8.8* 8.9   GFR: Estimated Creatinine Clearance: 95.7 mL/min (by C-G formula based on SCr of 0.61 mg/dL). Liver Function Tests: Recent Labs  Lab 08/24/22 0336  AST 11*  ALT 13  ALKPHOS 111  BILITOT 0.9  PROT 5.9*  ALBUMIN 2.8*   No results for input(s): "LIPASE", "AMYLASE" in the last 168 hours. No results for input(s): "AMMONIA" in the last 168 hours. Coagulation Profile: No results for input(s): "INR", "PROTIME" in the last 168 hours. Cardiac Enzymes: No results for input(s): "CKTOTAL", "CKMB", "CKMBINDEX", "TROPONINI" in the last 168 hours. BNP (last 3 results) No results for input(s): "PROBNP" in the last 8760 hours. HbA1C: Recent Labs    08/23/22 1925  HGBA1C 9.3*   CBG: Recent Labs  Lab 08/24/22 1529 08/24/22 1712 08/24/22 2150 08/25/22 0816 08/25/22 1127  GLUCAP 209* 187* 241* 237* 306*   Lipid Profile: No results for input(s): "CHOL", "HDL", "LDLCALC", "TRIG", "CHOLHDL", "LDLDIRECT" in the last 72 hours. Thyroid Function Tests: No results for input(s): "TSH", "T4TOTAL", "FREET4", "T3FREE", "THYROIDAB" in the last 72 hours. Anemia Panel: No results for input(s): "VITAMINB12", "FOLATE", "FERRITIN", "TIBC", "IRON", "RETICCTPCT" in the last 72 hours. Sepsis Labs: No results for input(s): "PROCALCITON", "LATICACIDVEN" in the last 168 hours.  Recent Results (from the past 240 hour(s))  Surgical PCR screen     Status: Abnormal   Collection Time: 08/24/22 12:16 AM   Specimen: Nasal Mucosa; Nasal Swab  Result Value Ref Range Status   MRSA, PCR POSITIVE (A) NEGATIVE Final   Staphylococcus aureus POSITIVE (A) NEGATIVE Final    Comment: (NOTE) The Xpert SA Assay (FDA approved for NASAL specimens in patients 82 years of age and older), is one component of a comprehensive surveillance program. It is not intended to diagnose infection nor to guide or  monitor treatment. Performed at [facility], [place] [address] [place], [place] [address]          Radiology Studies: DG Pelvis Portable  Result Date: 08/24/2022 CLINICAL DATA:  Postop check EXAM: PORTABLE PELVIS 1 VIEWS COMPARISON:  Pelvic radiograph dated July 27, 2022 FINDINGS: Postsurgical changes from open reduction of the left hip. Arthroplasty components appear in their expected alignment. Prior intramedullary rod fixation of right proximal femoral neck fracture. No periprosthetic fracture is identified. Expected postoperative changes within the overlying soft tissues. IMPRESSION: Postsurgical changes from open reduction of the left hip arthroplasty. Electronically Signed   By: Yetta Glassman M.D.   On: 08/24/2022 14:41   DG HIP UNILAT WITH PELVIS 1V LEFT  Result Date: 08/24/2022 CLINICAL DATA:  Status post open reduction of left hip. EXAM: DG HIP (WITH OR WITHOUT PELVIS) 1V*L* COMPARISON:  08/23/2022 FINDINGS: Previous left hip arthroplasty. Single there is been interval relocation of the left hip prosthesis. No signs of fracture. IMPRESSION: Interval reduction of left hip dislocation. Electronically Signed   By: Kerby Moors M.D.   On: 08/24/2022 13:28   DG C-Arm 1-60 Min-No Report  Result Date: 08/24/2022 Fluoroscopy  was utilized by the requesting physician.  No radiographic interpretation.   DG C-Arm 1-60 Min-No Report  Result Date: 08/24/2022 Fluoroscopy was utilized by the requesting physician.  No radiographic interpretation.   DG Hip Unilat With Pelvis 2-3 Views Left  Result Date: 08/23/2022 CLINICAL DATA:  Left hip pain and dislocation. EXAM: DG HIP (WITH OR WITHOUT PELVIS) 2-3V LEFT COMPARISON:  July 27, 2022. FINDINGS: Status post left total hip arthroplasty. 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 No definite fracture is noted. 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 Electronically Signed    By: Marijo Conception M.D.   On: 08/23/2022 13:59        Scheduled Meds:  aspirin  81 mg Oral BID   ciprofloxacin  2 drop Right Eye Q4H while awake   docusate sodium  100 mg Oral BID   fludrocortisone  0.2 mg Oral Daily   insulin aspart  0-15 Units Subcutaneous TID WC   insulin aspart  0-5 Units Subcutaneous QHS   midodrine  2.5 mg Oral TID WC   mupirocin ointment  1 Application Nasal BID   Continuous Infusions:  sodium chloride 75 mL/hr at 08/24/22 2342   sodium chloride 150 mL/hr at 08/24/22 1609   methocarbamol (ROBAXIN) IV       LOS: 1 day    Time spent: 35 minutes    Barb Merino, MD [facility] Pager [phone]

## 2022-08-25 NOTE — Progress Notes (Signed)
PT Cancellation Note  Patient Details Name: Micheal Moore. MRN: [id number] DOB: September 20, 1950   Cancelled Treatment:    Reason Eval/Treat Not Completed: Other (comment) Declined due to pain and wanting to eat.  Will f/u. Micheal Moore, PT Acute Rehab [facility] [phone]   Karlton Lemon 08/25/2022, 9:22 AM

## 2022-08-25 NOTE — Plan of Care (Signed)
  Problem: Education: Goal: Knowledge of General Education information will improve Description Including pain rating scale, medication(s)/side effects and non-pharmacologic comfort measures Outcome: Progressing   

## 2022-08-26 DIAGNOSIS — E1169 Type 2 diabetes mellitus with other specified complication: Secondary | ICD-10-CM | POA: Diagnosis not present

## 2022-08-26 DIAGNOSIS — Z86718 Personal history of other venous thrombosis and embolism: Secondary | ICD-10-CM | POA: Diagnosis not present

## 2022-08-26 DIAGNOSIS — S73035A Other anterior dislocation of left hip, initial encounter: Secondary | ICD-10-CM | POA: Diagnosis not present

## 2022-08-26 DIAGNOSIS — E7800 Pure hypercholesterolemia, unspecified: Secondary | ICD-10-CM | POA: Diagnosis not present

## 2022-08-26 LAB — PHOSPHORUS: Phosphorus: 3.2 mg/dL (ref 2.5–4.6)

## 2022-08-26 LAB — CBC WITH DIFFERENTIAL/PLATELET
Abs Immature Granulocytes: 0.04 10*3/uL (ref 0.00–0.07)
Basophils Absolute: 0 10*3/uL (ref 0.0–0.1)
Basophils Relative: 0 %
Eosinophils Absolute: 0.3 10*3/uL (ref 0.0–0.5)
Eosinophils Relative: 4 %
HCT: 23.8 % — ABNORMAL LOW (ref 39.0–52.0)
Hemoglobin: 7.5 g/dL — ABNORMAL LOW (ref 13.0–17.0)
Immature Granulocytes: 1 %
Lymphocytes Relative: 26 %
Lymphs Abs: 2 10*3/uL (ref 0.7–4.0)
MCH: 30.9 pg (ref 26.0–34.0)
MCHC: 31.5 g/dL (ref 30.0–36.0)
MCV: 97.9 fL (ref 80.0–100.0)
Monocytes Absolute: 0.4 10*3/uL (ref 0.1–1.0)
Monocytes Relative: 5 %
Neutro Abs: 4.9 10*3/uL (ref 1.7–7.7)
Neutrophils Relative %: 64 %
Platelets: 135 10*3/uL — ABNORMAL LOW (ref 150–400)
RBC: 2.43 MIL/uL — ABNORMAL LOW (ref 4.22–5.81)
RDW: 14.6 % (ref 11.5–15.5)
WBC: 7.7 10*3/uL (ref 4.0–10.5)
nRBC: 0 % (ref 0.0–0.2)

## 2022-08-26 LAB — COMPREHENSIVE METABOLIC PANEL
ALT: 10 U/L (ref 0–44)
AST: 13 U/L — ABNORMAL LOW (ref 15–41)
Albumin: 2 g/dL — ABNORMAL LOW (ref 3.5–5.0)
Alkaline Phosphatase: 71 U/L (ref 38–126)
Anion gap: 7 (ref 5–15)
BUN: 24 mg/dL — ABNORMAL HIGH (ref 8–23)
CO2: 24 mmol/L (ref 22–32)
Calcium: 8.1 mg/dL — ABNORMAL LOW (ref 8.9–10.3)
Chloride: 102 mmol/L (ref 98–111)
Creatinine, Ser: 0.73 mg/dL (ref 0.61–1.24)
GFR, Estimated: >60 mL/min (ref >60)
Glucose, Bld: 298 mg/dL — ABNORMAL HIGH (ref 70–99)
Potassium: 3.9 mmol/L (ref 3.5–5.1)
Sodium: 133 mmol/L — ABNORMAL LOW (ref 135–145)
Total Bilirubin: 0.5 mg/dL (ref 0.3–1.2)
Total Protein: 4.6 g/dL — ABNORMAL LOW (ref 6.5–8.1)

## 2022-08-26 LAB — MAGNESIUM: Magnesium: 1.4 mg/dL — ABNORMAL LOW (ref 1.7–2.4)

## 2022-08-26 LAB — GLUCOSE, CAPILLARY
Glucose-Capillary: 244 mg/dL — ABNORMAL HIGH (ref 70–99)
Glucose-Capillary: 264 mg/dL — ABNORMAL HIGH (ref 70–99)
Glucose-Capillary: 229 mg/dL — ABNORMAL HIGH (ref 70–99)
Glucose-Capillary: 342 mg/dL — ABNORMAL HIGH (ref 70–99)

## 2022-08-26 MED ORDER — SODIUM CHLORIDE 0.9 % IV SOLN: INTRAVENOUS | Status: DC | PRN

## 2022-08-26 MED ORDER — MAGNESIUM SULFATE 4 GM/100ML IV SOLN
4.0000 g | Freq: Once | INTRAVENOUS | Status: AC
  Administered 2022-08-26: 4 g via INTRAVENOUS
  Filled 2022-08-26: qty 100

## 2022-08-26 NOTE — Plan of Care (Signed)
  Problem: Education: Goal: Knowledge of General Education information will improve Description Including pain rating scale, medication(s)/side effects and non-pharmacologic comfort measures Outcome: Progressing   

## 2022-08-26 NOTE — Progress Notes (Signed)
PROGRESS NOTE    Micheal Moore.  BWG:[id number] DOB: Feb 01, 1951 DOA: 08/23/2022 PCP: [name], MD    Brief Narrative:  71 year old gentleman with history of orthostatic hypotension, hyperlipidemia, type 2 diabetes, chronic diastolic heart failure who had left hip fracture repaired on 11/3, prosthetic hip dislocation 11/10 brought to the emergency room for several days of difficulty bearing weight on the left leg and found to have prosthetic left hip dislocation.  Failed conservative management.  Admitted for ORIF.   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 Lyla Glassing.  Adequate IV and oral pain medications.  Hip precautions.  work with PT OT.  Weightbearing as tolerated. DVT prophylaxis, aspirin 81 mg twice daily. Pain medications, currently on Robaxin and Vicodin along with laxatives. Wound care, wound VAC on.  Discharge plan with portable wound VAC Prevana. Other postoperative management as per surgery. Patient's wife willing to take patient home, will benefit with additional DME's at home.  Type 2 diabetes, uncontrolled with hyperglycemia: On glipizide at home.  Currently remains on sliding scale insulin.  Since she is not going home on insulin, will keep on sliding scale insulin.  History of DVT: Previously on Eliquis.  Currently not taking.  Prophylaxis with aspirin.  Hypomagnesemia: IV replacement today.  Chronic medical issues including Essential hypertension with orthostatic hypotension, on Florinef and midodrine.  Continue. Chronic heart failure with preserved ejection fraction Hyperlipidemia Stable on home regimen.  Start working with PT OT.  Anticipate home once pain is controlled and surgically stable.  Will need hospital bed and other support system to set up at home. Likely home tomorrow once DME set up.   DVT prophylaxis: SCDs Start: 08/24/22 1605   Code Status: Full code Family Communication:  None at the bedside Disposition Plan: Status is: Inpatient Remains inpatient appropriate because: Immediate postop.     Consultants:  Orthopedics  Procedures:  ORIF, scheduled today  Antimicrobials:  Perioperative   Subjective: No new events.  Pain is controlled.  Objective: Vitals:   08/25/22 1030 08/25/22 1316 08/25/22 2134 08/26/22 0633  BP: (!) 123/98 129/64 (!) 148/71 (!) 149/83  Pulse: (!) 103 100 100 93  Resp: '18 18 18 18  '$ Temp: 99.2 F (37.3 C) 98.2 F (36.8 C) 99.1 F (37.3 C) 98.4 F (36.9 C)  TempSrc: Oral Oral Oral Oral  SpO2: 94% 92% 98% 96%  Weight:      Height:        Intake/Output Summary (Last 24 hours) at 08/26/2022 1040 Last data filed at 08/26/2022 1000 Gross per 24 hour  Intake 1920 ml  Output 1265 ml  Net 655 ml    Filed Weights   08/23/22 1400 08/23/22 1905  Weight: 88.5 kg 79.9 kg    Examination:  General exam: Appears calm and comfortable, on 1 L oxygen. Respiratory system: Clear to auscultation. Respiratory effort normal.  No added sounds. Cardiovascular system: S1 & S2 heard, RRR.  Gastrointestinal system: Abdomen is nondistended, soft and nontender. No organomegaly or masses felt. Normal bowel sounds heard. Central nervous system: Alert and oriented. No focal neurological deficits. Extremities: Symmetric 5 x 5 power. Left hip surgical incision looks clean and dry.  Postoperative dressing not removed.  Suction canister is dry.  Distal neurovascular status is intact. Left hip on immobilization left hip abduction brace.    Data Reviewed: I have personally reviewed following labs and imaging studies  CBC: Recent Labs  Lab 08/23/22 1250 08/24/22  4098 08/26/22 0316  WBC 7.6 7.9 7.7  NEUTROABS 5.4  --  4.9  HGB 10.8* 9.7* 7.5*  HCT 34.5* 31.3* 23.8*  MCV 96.9 98.7 97.9  PLT 195 152 135*    Basic Metabolic Panel: Recent Labs  Lab 08/23/22 1250 08/24/22 0336 08/26/22 0316  NA 138 137 133*  K 3.7 4.1 3.9  CL 102  104 102  CO2 '25 25 24  '$ GLUCOSE 420* 327* 298*  BUN 16 22 24*  CREATININE 0.75 0.61 0.73  CALCIUM 8.8* 8.9 8.1*  MG  --   --  1.4*  PHOS  --   --  3.2    GFR: Estimated Creatinine Clearance: 95.7 mL/min (by C-G formula based on SCr of 0.73 mg/dL). Liver Function Tests: Recent Labs  Lab 08/24/22 0336 08/26/22 0316  AST 11* 13*  ALT 13 10  ALKPHOS 111 71  BILITOT 0.9 0.5  PROT 5.9* 4.6*  ALBUMIN 2.8* 2.0*    No results for input(s): "LIPASE", "AMYLASE" in the last 168 hours. No results for input(s): "AMMONIA" in the last 168 hours. Coagulation Profile: No results for input(s): "INR", "PROTIME" in the last 168 hours. Cardiac Enzymes: No results for input(s): "CKTOTAL", "CKMB", "CKMBINDEX", "TROPONINI" in the last 168 hours. BNP (last 3 results) No results for input(s): "PROBNP" in the last 8760 hours. HbA1C: Recent Labs    08/23/22 1925  HGBA1C 9.3*    CBG: Recent Labs  Lab 08/25/22 0816 08/25/22 1127 08/25/22 1634 08/25/22 2150 08/26/22 0747  GLUCAP 237* 306* 302* 244* 244*    Lipid Profile: No results for input(s): "CHOL", "HDL", "LDLCALC", "TRIG", "CHOLHDL", "LDLDIRECT" in the last 72 hours. Thyroid Function Tests: No results for input(s): "TSH", "T4TOTAL", "FREET4", "T3FREE", "THYROIDAB" in the last 72 hours. Anemia Panel: No results for input(s): "VITAMINB12", "FOLATE", "FERRITIN", "TIBC", "IRON", "RETICCTPCT" in the last 72 hours. Sepsis Labs: No results for input(s): "PROCALCITON", "LATICACIDVEN" in the last 168 hours.  Recent Results (from the past 240 hour(s))  Surgical PCR screen     Status: Abnormal   Collection Time: 08/24/22 12:16 AM   Specimen: Nasal Mucosa; Nasal Swab  Result Value Ref Range Status   MRSA, PCR POSITIVE (A) NEGATIVE Final   Staphylococcus aureus POSITIVE (A) NEGATIVE Final    Comment: (NOTE) The Xpert SA Assay (FDA approved for NASAL specimens in patients 72 years of age and older), is one component of a  comprehensive surveillance program. It is not intended to diagnose infection nor to guide or monitor treatment. Performed at [facility], [place] [address] [place], [place] [address]          Radiology Studies: DG Pelvis Portable  Result Date: 08/24/2022 CLINICAL DATA:  Postop check EXAM: PORTABLE PELVIS 1 VIEWS COMPARISON:  Pelvic radiograph dated July 27, 2022 FINDINGS: Postsurgical changes from open reduction of the left hip. Arthroplasty components appear in their expected alignment. Prior intramedullary rod fixation of right proximal femoral neck fracture. No periprosthetic fracture is identified. Expected postoperative changes within the overlying soft tissues. IMPRESSION: Postsurgical changes from open reduction of the left hip arthroplasty. Electronically Signed   By: Yetta Glassman M.D.   On: 08/24/2022 14:41   DG HIP UNILAT WITH PELVIS 1V LEFT  Result Date: 08/24/2022 CLINICAL DATA:  Status post open reduction of left hip. EXAM: DG HIP (WITH OR WITHOUT PELVIS) 1V*L* COMPARISON:  08/23/2022 FINDINGS: Previous left hip arthroplasty. Single there is been interval relocation of the left hip prosthesis. No signs of fracture. IMPRESSION: Interval  reduction of left hip dislocation. Electronically Signed   By: Kerby Moors M.D.   On: 08/24/2022 13:28   DG C-Arm 1-60 Min-No Report  Result Date: 08/24/2022 Fluoroscopy was utilized by the requesting physician.  No radiographic interpretation.   DG C-Arm 1-60 Min-No Report  Result Date: 08/24/2022 Fluoroscopy was utilized by the requesting physician.  No radiographic interpretation.        Scheduled Meds:  aspirin  81 mg Oral BID   ciprofloxacin  2 drop Right Eye Q4H while awake   docusate sodium  100 mg Oral BID   fludrocortisone  0.2 mg Oral Daily   insulin aspart  0-15 Units Subcutaneous TID WC   insulin aspart  0-5 Units Subcutaneous QHS   midodrine  2.5 mg Oral TID WC   mupirocin ointment  1  Application Nasal BID   Continuous Infusions:  sodium chloride 10 mL/hr at 08/26/22 1015   magnesium sulfate bolus IVPB 4 g (08/26/22 1018)   methocarbamol (ROBAXIN) IV Stopped (08/25/22 1659)     LOS: 2 days    Time spent: 35 minutes    Barb Merino, MD [facility] Pager [phone]

## 2022-08-26 NOTE — Plan of Care (Signed)
  Problem: Education: Goal: Knowledge of General Education information will improve Description: Including pain rating scale, medication(s)/side effects and non-pharmacologic comfort measures Outcome: Progressing   Problem: Activity: Goal: Risk for activity intolerance will decrease Outcome: Progressing   Problem: Coping: Goal: Level of anxiety will decrease Outcome: Progressing   

## 2022-08-26 NOTE — Anesthesia Postprocedure Evaluation (Signed)
Anesthesia Post Note  Patient: Micheal Moore.  Procedure(s) Performed: OPEN REDUCTION, HEAD AND LINER EXCHANGE (Left: Hip)     Patient location during evaluation: PACU Anesthesia Type: General Level of consciousness: awake and alert Pain management: pain level controlled Vital Signs Assessment: post-procedure vital signs reviewed and stable Respiratory status: spontaneous breathing, nonlabored ventilation, respiratory function stable and patient connected to nasal cannula oxygen Cardiovascular status: blood pressure returned to baseline and stable Postop Assessment: no apparent nausea or vomiting Anesthetic complications: no  No notable events documented.  Last Vitals:  Vitals:   08/26/22 0633 08/26/22 1336  BP: (!) 149/83 (!) 120/55  Pulse: 93 99  Resp: 18 16  Temp: 36.9 C 37 C  SpO2: 96% 96%    Last Pain:  Vitals:   08/26/22 1336  TempSrc: Oral  PainSc:                  [name]

## 2022-08-27 DIAGNOSIS — S73005A Unspecified dislocation of left hip, initial encounter: Secondary | ICD-10-CM | POA: Diagnosis not present

## 2022-08-27 LAB — BASIC METABOLIC PANEL
Anion gap: 8 (ref 5–15)
BUN: 18 mg/dL (ref 8–23)
CO2: 25 mmol/L (ref 22–32)
Calcium: 8.2 mg/dL — ABNORMAL LOW (ref 8.9–10.3)
Chloride: 99 mmol/L (ref 98–111)
Creatinine, Ser: 0.74 mg/dL (ref 0.61–1.24)
GFR, Estimated: >60 mL/min (ref >60)
Glucose, Bld: 333 mg/dL — ABNORMAL HIGH (ref 70–99)
Potassium: 4.7 mmol/L (ref 3.5–5.1)
Sodium: 132 mmol/L — ABNORMAL LOW (ref 135–145)

## 2022-08-27 LAB — CBC
HCT: 27.4 % — ABNORMAL LOW (ref 39.0–52.0)
Hemoglobin: 8.5 g/dL — ABNORMAL LOW (ref 13.0–17.0)
MCH: 30.5 pg (ref 26.0–34.0)
MCHC: 31 g/dL (ref 30.0–36.0)
MCV: 98.2 fL (ref 80.0–100.0)
Platelets: 193 10*3/uL (ref 150–400)
RBC: 2.79 MIL/uL — ABNORMAL LOW (ref 4.22–5.81)
RDW: 15.1 % (ref 11.5–15.5)
WBC: 7.2 10*3/uL (ref 4.0–10.5)
nRBC: 0 % (ref 0.0–0.2)

## 2022-08-27 LAB — GLUCOSE, CAPILLARY
Glucose-Capillary: 269 mg/dL — ABNORMAL HIGH (ref 70–99)
Glucose-Capillary: 275 mg/dL — ABNORMAL HIGH (ref 70–99)
Glucose-Capillary: 241 mg/dL — ABNORMAL HIGH (ref 70–99)
Glucose-Capillary: 263 mg/dL — ABNORMAL HIGH (ref 70–99)

## 2022-08-27 MED ORDER — SENNOSIDES-DOCUSATE SODIUM 8.6-50 MG PO TABS: 1.0000 | ORAL_TABLET | Freq: Every day | ORAL | 0 refills | Status: AC

## 2022-08-27 MED ORDER — HYDROCODONE-ACETAMINOPHEN 10-325 MG PO TABS: 0.5000 | ORAL_TABLET | ORAL | 0 refills | Status: AC | PRN

## 2022-08-27 NOTE — Progress Notes (Signed)
    Subjective:  Patient reports pain as mild to moderate.  Denies N/V/CP/SOB. He denies any tingling or numbness in LE bilaterally.   All questions solicited and answered.  Objective:   VITALS:   Vitals:   08/26/22 0633 08/26/22 1336 08/26/22 2118 08/27/22 0445  BP: (!) 149/83 (!) 120/55 (!) 145/66 130/70  Pulse: 93 99 (!) 106 85  Resp: _0 Temp: 98.4 F (36.9 C) 98.6 F (37 C) 98.6 F (37 C) 97.7 F (36.5 C)  TempSrc: Oral Oral    SpO2: 96% 96% 93% 97%  Weight:      Height:        Patient sitting up in bed. NAD.  ABD soft Neurovascular intact Sensation intact distally Intact pulses distally Dorsiflexion/Plantar flexion intact Compartment soft Incision: Prevena negative pressure dressing C/D/I. On house suction unit at 66mHg. No leaks detected.  JP dressing: C/D/I 15cc SS fluid overnight.  Hip abduction brace on left hip.   Lab Results  Component Value Date   WBC 7.7 08/26/2022   HGB 7.5 (L) 08/26/2022   HCT 23.8 (L) 08/26/2022   MCV 97.9 08/26/2022   PLT 135 (L) 08/26/2022   BMET    Component Value Date/Time   NA 133 (L) 08/26/2022 0316   NA 137 12/04/2021 1429   K 3.9 08/26/2022 0316   CL 102 08/26/2022 0316   CO2 24 08/26/2022 0316   GLUCOSE 298 (H) 08/26/2022 0316   BUN 24 (H) 08/26/2022 0316   BUN 17 12/04/2021 1429   CREATININE 0.73 08/26/2022 0316   CALCIUM 8.1 (L) 08/26/2022 0316   EGFR 79 12/04/2021 1429   GFRNONAA >60 08/26/2022 0316     Assessment/Plan: 3 Days Post-Op   Principal Problem:   Closed dislocation of left hip (HCC) Active Problems:   Type 2 diabetes mellitus with hyperlipidemia (HCC)   History of DVT (deep vein thrombosis)   Hyperlipidemia   Chronic diastolic CHF (congestive heart failure) (HCC)   Orthostatic hypotension   Normocytic anemia   Anterior dislocation of left hip (HCC)   WBAT with walker. Posterior hip precautions left lower extremity. Continue left hip abduction brace.  DVT ppx: Aspirin,  SCDs, TEDS PO pain control PT/OT: Continue to work with PT today.  Dispo: Patient under care of the medical team, disposition per their recommendation. Per chart notes/discussion plan for patient to go home with his wife.  - Discontinue JP drain when output is lees than 10 cc/shift.  - Upon discharge, house VAC suction unit will need to be exchanged for portable Prevena suction unit.  He will follow-up within 7 days of discharge for removal of negative pressure dressing.   ACharlott Rakes PA-C.  08/27/2022, 8:06 AM   [facility]  [place] [address], [address], [place] [address]Phone: [phone] Facebook  [email or web address]

## 2022-08-27 NOTE — Care Management Important Message (Signed)
Important Message  Patient Details IM Letter given. Name: Micheal Moore. MRN: [id number] Date of Birth: [birth year]   Medicare Important Message Given:  Yes     Kerin Salen 08/27/2022, 3:37 PM

## 2022-08-27 NOTE — Discharge Summary (Signed)
Physician Discharge Summary  [name]. NID:[id number] DOB: 07/08/1951 DOA: 08/23/2022  PCP: [name], MD  Admit date: 08/23/2022 Discharge date: 08/27/2022  Admitted From: Home Discharge disposition: Home with PT, DMEs  Recommendations at discharge:  For pain control, continue Robaxin, Vicodin with laxatives Wound VAC to continue Follow-up with orthopedics as an outpatient.   Brief narrative: Braelynn Lupton. is a 71 y.o. male with PMH significant for orthostatic hypotension, chronic diastolic CHF, DM2, HLD, DVT, frequent falls, who has required 2 hip surgeries in the last 6 weeks.   He had a total hip arthroplasty on 11/3 after suffering a fall and fracture.  He had another fall that resulted in a dislocation of that hip and it was reduced in the ED in 07/27/2022. He has been doing well until a few days ago and has not been able to put weight on his left hip for several days and hence brought to the ED on 12/7. 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 Failed conservative management. Admitted for ORIF.   Subjective: Patient was seen and examined this morning.  Lying on bed.  Not in distress.  No new symptoms.  Feels ready for discharge Chart reviewed. In the last 24 hours, no fever, hemodynamically stable, breathing on room air. Blood sugar running over 200 consistently Pending labs this morning.  Hospital course: Closed dislocation of left hip S/p redo left hip 12/8 Dr. Lyla Glassing Per orthopedics WBAT with walker. Posterior hip precautions left lower extremity. Continue left hip abduction brace.  Currently on DVT ppx with aspirin 81 mg twice daily.  PTA, patient was on aspirin 325 mg daily.  I would resume the same at discharge. PO pain control with Robaxin, Vicodin with laxatives. Seen by PT/OT.  Recommended SNF.  Per patient's wife he is willing to take him home. Patient has a wound VAC in place.  Upon discharge, house  VAC suction unit will need to be exchanged for portable Prevena suction unit.   He will follow-up with orthopedics as an outpatient after 7 days of discharge for removal of negative pressure dressing.   Type 2 diabetes mellitus with hyperglycemia A1c 9.3 on 08/23/2022 PTA on glimepiride 2 mg daily.  Resume the same post discharge.  Patient is not interested in adding any more antidiabetic medication. Recent Labs  Lab 08/26/22 1207 08/26/22 1714 08/26/22 2207 08/27/22 0725 08/27/22 1222  GLUCAP 264* 229* 342* 269* 275*   Acute blood loss anemia on chronic anemia Hemoglobin at baseline between 9 and 10. Hemoglobin level this hospitalization likely because of blood loss from fracture and surgery. No other active bleeding.  Noted an improvement in hemoglobin last 24 hours.  Continue to monitor as an outpatient Recent Labs    07/25/22 0407 08/23/22 1250 08/24/22 0336 08/26/22 0316 08/27/22 1155  HGB 9.6* 10.8* 9.7* 7.5* 8.5*  MCV 94.1 96.9 98.7 97.9 98.2   Hyponatremia Expected to improve with oral intake improvement. Recent Labs  Lab 08/23/22 1250 08/24/22 0336 08/26/22 0316 08/27/22 1009  NA 138 137 133* 132*   History of DVT Previously on Eliquis.  Currently not taking.  Currently on DVT prophylaxis with aspirin.   Chronic heart failure with preserved ejection fraction Orthostatic hypotension Not on CHF regimen.  Because of chronic low blood pressure, patient is currently on Florinef 0.2 mg daily and midodrine 2.5 mg 3 times daily.    Hyperlipidemia Apparently not taking statin  Frequent falls Likely because of orthostatic  hypotension. PT OT eval obtained.  SNF was recommended but family wanted to take home.  Wounds:  - Incision (Closed) 07/20/22 Hip Left (Active)  Date First Assessed/Time First Assessed: 07/20/22 1648   Location: Hip  Location Orientation: Left    Assessments 07/20/2022  5:50 PM 07/25/2022  8:39 AM  Dressing Type Silver hydrofiber Silver  hydrofiber  Dressing Clean, Dry, Intact Clean, Dry, Intact  Site / Wound Assessment Dressing in place / Unable to assess Dressing in place / Unable to assess  Drainage Amount None None  Treatment Ice applied Ice applied     No associated orders.     Negative Pressure Wound Therapy Hip Left (Active)  Placement Date/Time: 08/24/22 1316   Wound Type: Incision (Closed wound)  Location: Hip  Location Orientation: Left    Assessments 08/24/2022  2:00 PM 08/27/2022  9:32 AM  Site / Wound Assessment Dressing in place / Unable to assess Dressing in place / Unable to assess  Peri-wound Assessment Intact --  Cycle Continuous Continuous  Target Pressure (mmHg) 125 125  Canister Changed -- No  Machine plugged into wall outlet (NOT bed outlet) -- Yes  Dressing Status Intact Intact  Drainage Amount None None  Output (mL) -- 0 mL     No associated orders.     Incision (Closed) 08/24/22 Hip (Active)  Date First Assessed/Time First Assessed: 08/24/22 1340   Location: Hip    Assessments 08/24/2022  2:00 PM 08/27/2022  9:32 AM  Dressing Type Negative pressure wound therapy Negative pressure wound therapy  Dressing Clean, Dry, Intact Clean, Dry, Intact  Site / Wound Assessment Dressing in place / Unable to assess Dressing in place / Unable to assess  Drainage Amount None None     No associated orders.    Discharge Exam:   Vitals:   08/26/22 1336 08/26/22 2118 08/27/22 0445 08/27/22 0932  BP: (!) 120/55 (!) 145/66 130/70 135/72  Pulse: 99 (!) 106 85 100  Resp: '16 18 18   '$ Temp: 98.6 F (37 C) 98.6 F (37 C) 97.7 F (36.5 C) 98.4 F (36.9 C)  TempSrc: Oral   Oral  SpO2: 96% 93% 97% 91%  Weight:      Height:        Body mass index is 20.89 kg/m.  General exam: Pleasant, elderly Caucasian male.  Not in physical distress Skin: No rashes, lesions or ulcers. HEENT: Atraumatic, normocephalic, no obvious bleeding Lungs: Clear to auscultation bilaterally CVS: Regular rate and rhythm, no  murmur GI/Abd soft, nontender, nondistended, bowel sound present CNS: Alert, awake, oriented x 3 Psychiatry: Mood appropriate Extremities: No pedal edema, no calf tenderness  Follow ups:    Follow-up Information     Swinteck, Aaron Edelman, MD Follow up.   Specialty: Orthopedic Surgery Why: For suture removal, For wound re-check Contact information: [address] [place] [address] [phone]         [name], MD Follow up.   Specialty: Endocrinology Contact information: [place] [address] [phone]                 Discharge Instructions:   Discharge Instructions     Call MD for:  difficulty breathing, headache or visual disturbances   Complete by: As directed    Call MD for:  extreme fatigue   Complete by: As directed    Call MD for:  hives   Complete by: As directed    Call MD for:  persistant dizziness  or light-headedness   Complete by: As directed    Call MD for:  persistant nausea and vomiting   Complete by: As directed    Call MD for:  severe uncontrolled pain   Complete by: As directed    Call MD for:  temperature >100.4   Complete by: As directed    Diet Carb Modified   Complete by: As directed    Discharge instructions   Complete by: As directed    Recommendations at discharge:   For pain control, continue Robaxin, Vicodin with laxatives  Wound VAC to continue  Follow-up with orthopedics as an outpatient.  Discharge instructions for diabetes mellitus: Check blood sugar 3 times a day and bedtime at home. If blood sugar running above 200 or less than 70 please call your MD to adjust insulin. If you notice signs and symptoms of hypoglycemia (low blood sugar) like jitteriness, confusion, thirst, tremor and sweating, please check blood sugar, drink sugary drink/biscuits/sweets to increase sugar level and call MD or return to ER.    General discharge instructions: Follow with Primary MD [name], MD in 7 days  Please request your PCP  to go over your hospital tests, procedures, radiology results at the follow up. Please get your medicines reviewed and adjusted.  Your PCP may decide to repeat certain labs or tests as needed. Do not drive, operate heavy machinery, perform activities at heights, swimming or participation in water activities or provide baby sitting services if your were admitted for syncope or siezures until you have seen by Primary MD or a Neurologist and advised to do so again. [place] Controlled Substance Reporting System database was reviewed. Do not drive, operate heavy machinery, perform activities at heights, swim, participate in water activities or provide baby-sitting services while on medications for pain, sleep and mood until your outpatient physician has reevaluated you and advised to do so again.  You are strongly recommended to comply with the dose, frequency and duration of prescribed medications. Activity: As tolerated with Full fall precautions use walker/cane & assistance as needed Avoid using any recreational substances like cigarette, tobacco, alcohol, or non-prescribed drug. If you experience worsening of your admission symptoms, develop shortness of breath, life threatening emergency, suicidal or homicidal thoughts you must seek medical attention immediately by calling 911 or calling your MD immediately  if symptoms less severe. You must read complete instructions/literature along with all the possible adverse reactions/side effects for all the medicines you take and that have been prescribed to you. Take any new medicine only after you have completely understood and accepted all the possible adverse reactions/side effects.  Wear Seat belts while driving. You were cared for by a hospitalist during your hospital stay. If you have any questions about your discharge medications or the care you received while you were in the hospital after you are  discharged, you can call the unit and ask to speak with the hospitalist or the covering physician. Once you are discharged, your primary care physician will handle any further medical issues. Please note that NO REFILLS for any discharge medications will be authorized once you are discharged, as it is imperative that you return to your primary care physician (or establish a relationship with a primary care physician if you do not have one).   Discharge wound care:   Complete by: As directed    Increase activity slowly   Complete by: As directed        Discharge Medications:   Allergies  as of 08/27/2022   No Known Allergies      Medication List     STOP taking these medications    apixaban 2.5 MG Tabs tablet Commonly known as: Eliquis   Ensure Max Protein Liqd   HYDROcodone-acetaminophen 5-325 MG tablet Commonly known as: NORCO/VICODIN Replaced by: HYDROcodone-acetaminophen 10-325 MG tablet   Jardiance 10 MG Tabs tablet Generic drug: empagliflozin   metoprolol tartrate 25 MG tablet Commonly known as: LOPRESSOR   rosuvastatin 5 MG tablet Commonly known as: CRESTOR   senna 8.6 MG Tabs tablet Commonly known as: SENOKOT       TAKE these medications    aspirin EC 325 MG tablet Take 325 mg by mouth daily.   fludrocortisone 0.1 MG tablet Commonly known as: FLORINEF Take 2 tablets (0.2 mg total) by mouth daily.   glimepiride 2 MG tablet Commonly known as: AMARYL Take 2 mg by mouth daily.   HYDROcodone-acetaminophen 10-325 MG tablet Commonly known as: Norco Take 0.5 tablets by mouth every 4 (four) hours as needed for up to 7 days. Replaces: HYDROcodone-acetaminophen 5-325 MG tablet   midodrine 5 MG tablet Commonly known as: PROAMATINE Take 0.5 tablets (2.5 mg total) by mouth 3 (three) times daily with meals.   polyethylene glycol 17 g packet Commonly known as: MIRALAX / GLYCOLAX Take 17 g by mouth daily.   senna-docusate 8.6-50 MG tablet Commonly known  as: Senokot-S Take 1 tablet by mouth daily.               Durable Medical Equipment  (From admission, onward)           Start     Ordered   08/24/22 1448  For home use only DME Hospital bed  Once       Question Answer Comment  Length of Need Lifetime   The above medical condition requires: Patient requires the ability to reposition immediately   Head must be elevated greater than: 30 degrees   Bed type Semi-electric   Support Surface: Gel Overlay      08/24/22 1448              Discharge Care Instructions  (From admission, onward)           Start     Ordered   08/27/22 0000  Discharge wound care:        08/27/22 1325             The results of significant diagnostics from this hospitalization (including imaging, microbiology, ancillary and laboratory) are listed below for reference.    Procedures and Diagnostic Studies:   DG Pelvis Portable  Result Date: 08/24/2022 CLINICAL DATA:  Postop check EXAM: PORTABLE PELVIS 1 VIEWS COMPARISON:  Pelvic radiograph dated July 27, 2022 FINDINGS: Postsurgical changes from open reduction of the left hip. Arthroplasty components appear in their expected alignment. Prior intramedullary rod fixation of right proximal femoral neck fracture. No periprosthetic fracture is identified. Expected postoperative changes within the overlying soft tissues. IMPRESSION: Postsurgical changes from open reduction of the left hip arthroplasty. Electronically Signed   By: Yetta Glassman M.D.   On: 08/24/2022 14:41   DG HIP UNILAT WITH PELVIS 1V LEFT  Result Date: 08/24/2022 CLINICAL DATA:  Status post open reduction of left hip. EXAM: DG HIP (WITH OR WITHOUT PELVIS) 1V*L* COMPARISON:  08/23/2022 FINDINGS: Previous left hip arthroplasty. Single there is been interval relocation of the left hip prosthesis. No signs of fracture. IMPRESSION: Interval reduction of left hip dislocation. Electronically  Signed   By: Kerby Moors M.D.    On: 08/24/2022 13:28   DG C-Arm 1-60 Min-No Report  Result Date: 08/24/2022 Fluoroscopy was utilized by the requesting physician.  No radiographic interpretation.   DG C-Arm 1-60 Min-No Report  Result Date: 08/24/2022 Fluoroscopy was utilized by the requesting physician.  No radiographic interpretation.   DG Hip Unilat With Pelvis 2-3 Views Left  Result Date: 08/23/2022 CLINICAL DATA:  Left hip pain and dislocation. EXAM: DG HIP (WITH OR WITHOUT PELVIS) 2-3V LEFT COMPARISON:  July 27, 2022. FINDINGS: Status post left total hip arthroplasty. 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 No definite fracture is noted. 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 Electronically Signed   By: Marijo Conception M.D.   On: 08/23/2022 13:59     Labs:   Basic Metabolic Panel: Recent Labs  Lab 08/23/22 1250 08/24/22 0336 08/26/22 0316 08/27/22 1009  NA 138 137 133* 132*  K 3.7 4.1 3.9 4.7  CL 102 104 102 99  CO2 '25 25 24 25  '$ GLUCOSE 420* 327* 298* 333*  BUN 16 22 24* 18  CREATININE 0.75 0.61 0.73 0.74  CALCIUM 8.8* 8.9 8.1* 8.2*  MG  --   --  1.4*  --   PHOS  --   --  3.2  --    GFR Estimated Creatinine Clearance: 95.7 mL/min (by C-G formula based on SCr of 0.74 mg/dL). Liver Function Tests: Recent Labs  Lab 08/24/22 0336 08/26/22 0316  AST 11* 13*  ALT 13 10  ALKPHOS 111 71  BILITOT 0.9 0.5  PROT 5.9* 4.6*  ALBUMIN 2.8* 2.0*   No results for input(s): "LIPASE", "AMYLASE" in the last 168 hours. No results for input(s): "AMMONIA" in the last 168 hours. Coagulation profile No results for input(s): "INR", "PROTIME" in the last 168 hours.  CBC: Recent Labs  Lab 08/23/22 1250 08/24/22 0336 08/26/22 0316 08/27/22 1155  WBC 7.6 7.9 7.7 7.2  NEUTROABS 5.4  --  4.9  --   HGB 10.8* 9.7* 7.5* 8.5*  HCT 34.5* 31.3* 23.8* 27.4*  MCV 96.9 98.7 97.9 98.2  PLT 195 152 135* 193   Cardiac Enzymes: No results for input(s): "CKTOTAL",  "CKMB", "CKMBINDEX", "TROPONINI" in the last 168 hours. BNP: Invalid input(s): "POCBNP" CBG: Recent Labs  Lab 08/26/22 1207 08/26/22 1714 08/26/22 2207 08/27/22 0725 08/27/22 1222  GLUCAP 264* 229* 342* 269* 275*   D-Dimer No results for input(s): "DDIMER" in the last 72 hours. Hgb A1c No results for input(s): "HGBA1C" in the last 72 hours. Lipid Profile No results for input(s): "CHOL", "HDL", "LDLCALC", "TRIG", "CHOLHDL", "LDLDIRECT" in the last 72 hours. Thyroid function studies No results for input(s): "TSH", "T4TOTAL", "T3FREE", "THYROIDAB" in the last 72 hours.  Invalid input(s): "FREET3" Anemia work up No results for input(s): "VITAMINB12", "FOLATE", "FERRITIN", "TIBC", "IRON", "RETICCTPCT" in the last 72 hours. Microbiology Recent Results (from the past 240 hour(s))  Surgical PCR screen     Status: Abnormal   Collection Time: 08/24/22 12:16 AM   Specimen: Nasal Mucosa; Nasal Swab  Result Value Ref Range Status   MRSA, PCR POSITIVE (A) NEGATIVE Final   Staphylococcus aureus POSITIVE (A) NEGATIVE Final    Comment: (NOTE) The Xpert SA Assay (FDA approved for NASAL specimens in patients 65 years of age and older), is one component of a comprehensive surveillance program. It is not intended to diagnose infection nor to guide or monitor treatment. Performed at [facility]  [place] [address] [place], [place] [address]     Time coordinating discharge: 35 minutes  Signed: Marlowe Aschoff Adante Courington  [facility] 08/27/2022, 1:25 PM

## 2022-08-27 NOTE — TOC Transition Note (Addendum)
Transition of Care [facility]) - CM/SW Discharge Note  Patient Details  Name: Micheal Moore. MRN: [id number] Date of Birth: [birth year]  Transition of Care [facility]) CM/SW Contact:  Sherie Don, LCSW Phone Number: 08/27/2022, 1:42 PM  Clinical Narrative: CSW confirmed with wife and Erasmo Downer with [employer] that the hospital bed has been delivered to the patient's home. TOC signing off.  Addendum: Patient will need PTAR to discharge home. Medical necessity form done; PTAR scheduled.  Final next level of care: Home/Self Care Barriers to Discharge: Barriers Resolved  Patient Goals and CMS Choice Patient states their goals for this hospitalization and ongoing recovery are:: Discharge home with hospital bed CMS Medicare.gov Compare Post Acute Care list provided to:: Patient Represenative (must comment) Sydnee Cabal (spouse)) Choice offered to / list presented to : Patient, Spouse  Discharge Plan and Services In-house Referral: Clinical Social Work Post Acute Care Choice: Durable Medical Equipment          DME Arranged: Hospital bed, Gel overlay DME Agency: [employer] Date DME Agency Contacted: 08/24/22 Time DME Agency Contacted: [phone] Representative spoke with at [place]: [place] ([place]) Interventions    Readmission Risk Interventions    07/25/2022    2:42 PM  Readmission Risk Prevention Plan  Transportation Screening   PCP or Specialist Appt within 5-7 [place] Screening   Medication Review (RN CM)      Information is confidential and restricted. Go to Review Flowsheets to unlock data.

## 2022-08-27 NOTE — Progress Notes (Signed)
JP drain discontinued per Naida Sleight H.-PA, Pt tolerated well. Dry dressing applied.

## 2022-08-27 NOTE — Consult Note (Signed)
   [facility] CM Inpatient Consult   08/27/2022  [place] May 24, 1951 [id number]  [facility] [ACO] Patient: [facility] Liaison remote coverage review for patient admitted to [place] Provider:  [name], MD with [facility]  Patient screened for less than 30 days readmission hospitalization with noted extreme high risk score for unplanned readmission risk.  Patient electronic medical record was assessed for potential [place] Management service needs for post hospital transition for care coordination.  Review of patient's electronic medical record reveals patient is highly recommended for a skilled nursing facility.  Call the number provided and spoke with patient's wife, HIPAA verified, she states they are for home and not considering SNF.  Explained that this Probation officer can refer a [facility] Coordinator to check on the patient's progress.  She verbally agrees to follow up calls, to check progress and encouraged to verbalize any post hospital needs.   Plan:  Referral request for community care coordination: follow up for readmission prevention measures for patient declining SNF.  Of note, [facility] Care Management/Population Health does not replace or interfere with any arrangements made by the Inpatient Transition of Care team.  For questions contact:   Natividad Brood, RN BSN [place]  [phone] business mobile phone Toll free office [phone]  *[place]  [phone] Fax number: [phone] [email or web address]

## 2022-08-27 NOTE — Plan of Care (Signed)
  Problem: Education: Goal: Ability to describe self-care measures that may prevent or decrease complications (Diabetes Survival Skills Education) will improve Outcome: Progressing   Problem: Coping: Goal: Ability to adjust to condition or change in health will improve Outcome: Progressing   Problem: Fluid Volume: Goal: Ability to maintain a balanced intake and output will improve Outcome: Progressing   Problem: Health Behavior/Discharge Planning: Goal: Ability to identify and utilize available resources and services will improve Outcome: Progressing   Problem: Skin Integrity: Goal: Risk for impaired skin integrity will decrease Outcome: Progressing   Problem: Activity: Goal: Risk for activity intolerance will decrease Outcome: Progressing

## 2022-08-27 NOTE — Progress Notes (Signed)
Noted maceration to perineum, buttocks and scrotum. Per PT, Pt had greenish drainage on his bed pad when Pt turned. RN peeled sacral foam dressing, noted that his sacrum is pink, macerated, has crusted area with green drainage. MD notified.  Called wife to update. Per wife, Pt had maceration to perineum, scrotum and sacrum since discharge from [place] and she had taken pictures of it. She said that she has creams and dressings at home that she uses to treat it. She said that Pt's sacrum was crusted but did not notice any green drainage. MD updated with RN's conversation with wife.  Perineal area was cleansed, barrier cream applied, and sacral foam dressing changed.

## 2022-08-27 NOTE — Progress Notes (Signed)
Physical Therapy Treatment Patient Details Name: Micheal Moore. MRN: [id number] DOB: 1950-12-29 Today's Date: 08/27/2022   History of Present Illness Pt is 71 yo male admitted 08/23/22 with prosthetic L hip dislocation.  Pt with initial L hip fx 11/3 anterior hemiarthroplasty, fall on 11/10 with periprosthetic dislocation L hip that was closed reduced in ED.  Pt returned to ED 12/7 with second dislocation and is now s/p revision of L THA on 08/24/22.  Pt with other hx including orthostatic hypotension, HLD, DM2, CHF, R hip fx with IM nail 6/23.    PT Comments     Continue to strongly recommend SNF, per chart family declines. pt will need hospital bed and lift for home. It will be extremely difficult to manage pt with +1 assist inhome setting. Worked on rolling this session and scooting up in bed. Pt noted to have green drainage on old pad when bed pad changed, RN made aware.  Pt is at risk for moisture associated skin damage as well as skin break down d/t hip brace.   Recommendations for follow up therapy are one component of a multi-disciplinary discharge planning process, led by the attending physician.  Recommendations may be updated based on patient status, additional functional criteria and insurance authorization.  Follow Up Recommendations  Skilled nursing-short term rehab (<3 hours/day) (family refusing SNF) Can patient physically be transported by private vehicle: No   Assistance Recommended at Discharge Frequent or constant Supervision/Assistance  Patient can return home with the following Two people to help with walking and/or transfers;Two people to help with bathing/dressing/bathroom;Help with stairs or ramp for entrance;Assistance with Education officer, environmental cushion (measurements PT);BSC/3in1;Wheelchair (measurements PT);Hospital bed    Recommendations for Other Services       Precautions / Restrictions Precautions Precautions:  Fall;Posterior Hip Precaution Booklet Issued:  (posterior hip precautions handout) Precaution Comments: hx orthostatic hypotension Required Braces or Orthoses: Other Brace Other Brace: Hip ABD brace at all times Restrictions, abduction pillow, incisional VAC, JP drain LLE Weight Bearing: Weight bearing as tolerated     Mobility  Bed Mobility Overal bed mobility: Needs Assistance Bed Mobility: Rolling Rolling: Mod assist, Max assist         General bed mobility comments: [name] to roll R and L with mod to max assist to manage LLE/hip precautions, bring hips forward. incr time and difficulty d/t hip abduction brace. pt requires multimodal cues and frequent redirection to task. pad on bed removed and changed to new pad with one assist and therapist switching from one side to the other to assist pt and then initiate pad change;    Transfers                        Ambulation/Gait                   Stairs             Wheelchair Mobility    Modified Rankin (Stroke Patients Only)       Balance                                            Cognition Arousal/Alertness: Awake/alert Behavior During Therapy: WFL for tasks assessed/performed Overall Cognitive Status: No family/caregiver present to determine baseline cognitive functioning  General Comments: Pt poor historian and not Moore to provide PLOF clearly; required repeated instructions and easily distracted; unable to retain or recall hip precautions        Exercises      General Comments        Pertinent Vitals/Pain Pain Assessment Pain Assessment: Faces Faces Pain Scale: Hurts whole lot Pain Location: L hip movement Pain Descriptors / Indicators: Grimacing, Sore, Discomfort Pain Intervention(s): Limited activity within patient's tolerance, Monitored during session    Home Living                          Prior  Function            PT Goals (current goals can now be found in the care plan section) Acute Rehab PT Goals Patient Stated Goal: not Moore to state PT Goal Formulation: With patient Time For Goal Achievement: 09/08/22 Potential to Achieve Goals: Fair Progress towards PT goals: Progressing toward goals    Frequency    Min 3X/week      PT Plan Current plan remains appropriate    Co-evaluation              AM-PAC PT "6 Clicks" Mobility   Outcome Measure  Help needed turning from your back to your side while in a flat bed without using bedrails?: A Lot Help needed moving from lying on your back to sitting on the side of a flat bed without using bedrails?: Total Help needed moving to and from a bed to a chair (including a wheelchair)?: Total Help needed standing up from a chair using your arms (e.g., wheelchair or bedside chair)?: Total Help needed to walk in hospital room?: Total Help needed climbing 3-5 steps with a railing? : Total 6 Click Score: 7    End of Session Equipment Utilized During Treatment: Other (comment) (hip abd brace) Activity Tolerance: Patient tolerated treatment well Patient left: in bed;with call bell/phone within reach;with bed alarm set Nurse Communication: Mobility status;Other (comment) (hip abd pillow; green drainage on bed pad) PT Visit Diagnosis: Unsteadiness on feet (R26.81);Muscle weakness (generalized) (M62.81);Pain Pain - Right/Left: Left Pain - part of body: Hip     Time: 6948-5462 PT Time Calculation (min) (ACUTE ONLY): 24 min  Charges:  $Therapeutic Activity: 23-37 mins                     Baxter Flattery, PT  Acute Rehab Dept [facility]) [phone]  [facility] Weekend Pager [facility] only)  [phone]  08/27/2022    [facility] 08/27/2022, 1:58 PM

## 2022-08-28 DIAGNOSIS — S73005A Unspecified dislocation of left hip, initial encounter: Secondary | ICD-10-CM | POA: Diagnosis not present

## 2022-08-28 LAB — GLUCOSE, CAPILLARY
Glucose-Capillary: 275 mg/dL — ABNORMAL HIGH (ref 70–99)
Glucose-Capillary: 311 mg/dL — ABNORMAL HIGH (ref 70–99)
Glucose-Capillary: 258 mg/dL — ABNORMAL HIGH (ref 70–99)

## 2022-08-28 MED ORDER — ENSURE MAX PROTEIN PO LIQD: 11.0000 [oz_av] | Freq: Two times a day (BID) | ORAL | Status: DC

## 2022-08-28 MED ORDER — NYSTATIN 100000 UNIT/GM EX POWD: 1.0000 | Freq: Three times a day (TID) | CUTANEOUS | 0 refills | Status: DC

## 2022-08-28 NOTE — Progress Notes (Signed)
The patient is alert and oriented and has been seen by his physician.The orders for discharge were written. IV has been removed. Went over discharge instructions with patient and family. Went over [employer] (portable) wound vac instructions with patient's wife and answered questions about discharge. Patient is being discharged home via [facility] with all of his belongings.

## 2022-08-28 NOTE — Discharge Summary (Signed)
Physician Discharge Summary  Micheal Moore. HWE:[id number] DOB: 06/03/1951 DOA: 08/23/2022  PCP: [name], MD  Admit date: 08/23/2022 Discharge date: 08/28/2022  Admitted From: Home Discharge disposition: Home with PT, DMEs  Recommendations at discharge:  For pain control, continue Robaxin, Vicodin with laxatives Wound VAC to continue at home Follow-up with orthopedics as an outpatient after 7 days of discharge For the perineal wound recommended moisturizer application to promote drying and healing.  Also recommend application of barrier cream and antifungal powder to bilat buttocks and perineum with each turning and cleaning session.    Brief narrative: Micheal Shorey. is a 71 y.o. male with PMH significant for orthostatic hypotension, chronic diastolic CHF, DM2, HLD, DVT, frequent falls, who has required 2 hip surgeries in the last 6 weeks.   He had a total hip arthroplasty on 11/3 after suffering a fall and fracture.  He had another fall that resulted in a dislocation of that hip and it was reduced in the ED in 07/27/2022. He has been doing well until a few days ago and has not been able to put weight on his left hip for several days and hence brought to the ED on 12/7. 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 Failed conservative management. Admitted for ORIF.   Subjective: Patient was seen and examined this morning.  Lying on bed.  Not in distress.  Patient was prepared for discharge yesterday but his wife was feeling sick and was not able to pick him up yesterday.  Also yesterday afternoon, RN noted him to have perineal wound as described below.  Seen by wound care.  Hospital course: Closed dislocation of left hip S/p redo left hip 12/8 Dr. Lyla Glassing Per orthopedics WBAT with walker. Posterior hip precautions left lower extremity. Continue left hip abduction brace.  Currently on DVT ppx with aspirin 81 mg twice daily.  PTA,  patient was on aspirin 325 mg daily.  I would resume the same at discharge. PO pain control with Robaxin, Vicodin with laxatives. Seen by PT/OT.  Recommended SNF.  Per patient's wife he is willing to take him home. Patient has a wound VAC in place.  Upon discharge, house VAC suction unit will need to be exchanged for portable Prevena suction unit.   He will follow-up with orthopedics as an outpatient after 7 days of discharge for removal of negative pressure dressing.   Type 2 diabetes mellitus with hyperglycemia A1c 9.3 on 08/23/2022 PTA on glimepiride 2 mg daily.  Resume the same post discharge.  Patient is not interested in adding any more antidiabetic medication. Recent Labs  Lab 08/27/22 1222 08/27/22 1637 08/27/22 2057 08/28/22 0743 08/28/22 1204  GLUCAP 275* 241* 263* 275* 311*   Acute blood loss anemia on chronic anemia Hemoglobin at baseline between 9 and 10. Hemoglobin level this hospitalization likely because of blood loss from fracture and surgery. No other active bleeding.  Noted an improvement in hemoglobin last 24 hours.  Continue to monitor as an outpatient Recent Labs    07/25/22 0407 08/23/22 1250 08/24/22 0336 08/26/22 0316 08/27/22 1155  HGB 9.6* 10.8* 9.7* 7.5* 8.5*  MCV 94.1 96.9 98.7 97.9 98.2   Hyponatremia Expected to improve with oral intake improvement.  History of DVT Previously on Eliquis.  Currently not taking.  Currently on DVT prophylaxis with aspirin.   Chronic heart failure with preserved ejection fraction Orthostatic hypotension Not on CHF regimen.  Because of chronic low blood pressure, patient  is currently on Florinef 0.2 mg daily and midodrine 2.5 mg 3 times daily.    Hyperlipidemia Apparently not taking statin  Frequent falls Likely because of orthostatic hypotension. PT OT eval obtained.  SNF was recommended but family wanted to take home.  Irritant contact dermatitis POA Erythema intertrigo Seen by wound care.  Noted to have  red moist macerated skin to the affected areas; appearance is consistent with moisture associated skin damage likely due to fecal and urinary incontinence.  Also noted to have Erythema intertrigo in the genital/thigh area due to sweating and friction. Per patient's wife, he had it since his recent stay at the nursing facility. Recommended moisturizer application to promote drying and healing.  Also recommend application of barrier cream and antifungal powder to bilat buttocks and perineum with each turning and cleaning session.   Wounds:  - Incision (Closed) 07/20/22 Hip Left (Active)  Date First Assessed/Time First Assessed: 07/20/22 1648   Location: Hip  Location Orientation: Left    Assessments 07/20/2022  5:50 PM 07/25/2022  8:39 AM  Dressing Type Silver hydrofiber Silver hydrofiber  Dressing Clean, Dry, Intact Clean, Dry, Intact  Site / Wound Assessment Dressing in place / Unable to assess Dressing in place / Unable to assess  Drainage Amount None None  Treatment Ice applied Ice applied     No associated orders.     Negative Pressure Wound Therapy Hip Left (Active)  Placement Date/Time: 08/24/22 1316   Wound Type: Incision (Closed wound)  Location: Hip  Location Orientation: Left    Assessments 08/24/2022  2:00 PM 08/28/2022  9:35 AM  Site / Wound Assessment Dressing in place / Unable to assess Dressing in place / Unable to assess  Peri-wound Assessment Intact Intact  Cycle Continuous Continuous  Target Pressure (mmHg) 125 75  Canister Changed -- No  Machine plugged into wall outlet (NOT bed outlet) -- Yes  Dressing Status Intact Intact  Drainage Amount None None  Output (mL) -- 0 mL     No associated orders.     Incision (Closed) 08/24/22 Hip (Active)  Date First Assessed/Time First Assessed: 08/24/22 1340   Location: Hip    Assessments 08/24/2022  2:00 PM 08/28/2022  9:35 AM  Dressing Type Negative pressure wound therapy Negative pressure wound therapy  Dressing Clean, Dry,  Intact Clean, Dry, Intact  Site / Wound Assessment Dressing in place / Unable to assess Dressing in place / Unable to assess  Drainage Amount None None  Treatment -- Negative pressure wound therapy     No associated orders.     Wound / Incision (Open or Dehisced) 08/28/22 Irritant Dermatitis (Moisture Associated Skin Damage) Buttocks Right;Left (Active)  Date First Assessed: 08/28/22   Wound Type: Irritant Dermatitis (Moisture Associated Skin Damage)  Location: Buttocks  Location Orientation: Right;Left    Assessments 08/28/2022  8:00 AM  % Wound base Red or Granulating 100%     No associated orders.    Discharge Exam:   Vitals:   08/27/22 0932 08/27/22 1335 08/27/22 2150 08/28/22 0342  BP: 135/72 (!) 154/81 (!) 155/77 (!) 141/83  Pulse: 100 100 (!) 102 90  Resp:  '16 18 15  '$ Temp: 98.4 F (36.9 C) 98.7 F (37.1 C) 98.8 F (37.1 C) 98.7 F (37.1 C)  TempSrc: Oral Oral Oral Oral  SpO2: 91% 90% 93% 94%  Weight:      Height:        Body mass index is 20.89 kg/m.  General exam: Pleasant, elderly  Caucasian male.  Not in physical distress Skin: No rashes, lesions or ulcers. HEENT: Atraumatic, normocephalic, no obvious bleeding Lungs: Clear to auscultation bilaterally CVS: Regular rate and rhythm, no murmur GI/Abd soft, nontender, nondistended, bowel sound present CNS: Alert, awake, oriented x 3 Psychiatry: Mood appropriate Extremities: No pedal edema, no calf tenderness Perineal wound as described above.  Follow ups:    Follow-up Information     S[place]te[place], Aaron Edelman, MD Follow up.   Specialty: Orthopedic Surgery Why: For suture removal, For wound re-check Contact information: [address] [place] [address] [phone]         [name], MD Follow up.   Specialty: Endocrinology Contact information: [place] [address] [phone]                 Discharge Instructions:   Discharge Instructions     AMB  Referral to [place]   Complete by: As directed    Hospital Follow up referral/Care Coordination: High risk, less than 30 days readmission, readmission prevention, refusing SNF, also Hemoglobin A1C 9.4  Primary Care Provider: [name], MD  Insurance plan: [facility] Medicare  Please assign to [place] Coordinator for post hospital care coordination for readmission prevention follow up calls and assess for further needs.  Questions please call:   Natividad Brood, RN BSN [place]  [phone] business mobile phone Toll free office [phone]  *[place]  [phone] Fax number: [phone] [email or web address]   Reason for Referral: Care Coordination ([facility] patients)   Disease managment services needed: Nurse Case Manager   Diagnoses of:  Diabetes Other     Other Diagnosis: Hip Fx -  S/p ORIF   Expected date of contact: Emergent - 3 Days   Call MD for:  difficulty breathing, headache or visual disturbances   Complete by: As directed    Call MD for:  extreme fatigue   Complete by: As directed    Call MD for:  hives   Complete by: As directed    Call MD for:  persistant dizziness or light-headedness   Complete by: As directed    Call MD for:  persistant nausea and vomiting   Complete by: As directed    Call MD for:  severe uncontrolled pain   Complete by: As directed    Call MD for:  temperature >100.4   Complete by: As directed    Diet Carb Modified   Complete by: As directed    Discharge instructions   Complete by: As directed    Recommendations at discharge:   For pain control, continue Robaxin, Vicodin with laxatives  Wound VAC to continue  Follow-up with orthopedics as an outpatient.  Discharge instructions for diabetes mellitus: Check blood sugar 3 times a day and bedtime at home. If blood sugar running above 200 or less than 70 please  call your MD to adjust insulin. If you notice signs and symptoms of hypoglycemia (low blood sugar) like jitteriness, confusion, thirst, tremor and sweating, please check blood sugar, drink sugary drink/biscuits/sweets to increase sugar level and call MD or return to ER.    General discharge instructions: Follow with Primary MD [name], MD in 7 days  Please request your PCP  to go over your hospital tests, procedures, radiology results at the follow up. Please get your medicines reviewed and adjusted.  Your PCP may decide to repeat certain labs or tests as needed. Do not drive, operate  heavy machinery, perform activities at heights, swimming or participation in water activities or provide baby sitting services if your were admitted for syncope or siezures until you have seen by Primary MD or a Neurologist and advised to do so again. [place] Controlled Substance Reporting System database was reviewed. Do not drive, operate heavy machinery, perform activities at heights, swim, participate in water activities or provide baby-sitting services while on medications for pain, sleep and mood until your outpatient physician has reevaluated you and advised to do so again.  You are strongly recommended to comply with the dose, frequency and duration of prescribed medications. Activity: As tolerated with Full fall precautions use walker/cane & assistance as needed Avoid using any recreational substances like cigarette, tobacco, alcohol, or non-prescribed drug. If you experience worsening of your admission symptoms, develop shortness of breath, life threatening emergency, suicidal or homicidal thoughts you must seek medical attention immediately by calling 911 or calling your MD immediately  if symptoms less severe. You must read complete instructions/literature along with all the possible adverse reactions/side effects for all the medicines you take and that have been prescribed to you. Take any new  medicine only after you have completely understood and accepted all the possible adverse reactions/side effects.  Wear Seat belts while driving. You were cared for by a hospitalist during your hospital stay. If you have any questions about your discharge medications or the care you received while you were in the hospital after you are discharged, you can call the unit and ask to speak with the hospitalist or the covering physician. Once you are discharged, your primary care physician will handle any further medical issues. Please note that NO REFILLS for any discharge medications will be authorized once you are discharged, as it is imperative that you return to your primary care physician (or establish a relationship with a primary care physician if you do not have one).   Discharge wound care:   Complete by: As directed    Increase activity slowly   Complete by: As directed        Discharge Medications:   Allergies as of 08/28/2022   No Known Allergies      Medication List     STOP taking these medications    apixaban 2.5 MG Tabs tablet Commonly known as: Eliquis   HYDROcodone-acetaminophen 5-325 MG tablet Commonly known as: NORCO/VICODIN Replaced by: HYDROcodone-acetaminophen 10-325 MG tablet   metoprolol tartrate 25 MG tablet Commonly known as: LOPRESSOR   rosuvastatin 5 MG tablet Commonly known as: CRESTOR   senna 8.6 MG Tabs tablet Commonly known as: SENOKOT       TAKE these medications    aspirin EC 325 MG tablet Take 325 mg by mouth daily.   Ensure Max Protein Liqd Take 330 mLs (11 oz total) by mouth 2 (two) times daily.   fludrocortisone 0.1 MG tablet Commonly known as: FLORINEF Take 2 tablets (0.2 mg total) by mouth daily.   glimepiride 2 MG tablet Commonly known as: AMARYL Take 2 mg by mouth daily.   HYDROcodone-acetaminophen 10-325 MG tablet Commonly known as: Norco Take 0.5 tablets by mouth every 4 (four) hours as needed for up to 7  days. Replaces: HYDROcodone-acetaminophen 5-325 MG tablet   Jardiance 10 MG Tabs tablet Generic drug: empagliflozin Take 10 mg by mouth daily.   midodrine 5 MG tablet Commonly known as: PROAMATINE Take 0.5 tablets (2.5 mg total) by mouth 3 (three) times daily with meals.   nystatin powder Commonly known  as: MYCOSTATIN/NYSTOP Apply 1 Application topically 3 (three) times daily. In the groin   polyethylene glycol 17 g packet Commonly known as: MIRALAX / GLYCOLAX Take 17 g by mouth daily.   senna-docusate 8.6-50 MG tablet Commonly known as: Senokot-S Take 1 tablet by mouth daily.               Durable Medical Equipment  (From admission, onward)           Start     Ordered   08/24/22 1448  For home use only DME Hospital bed  Once       Question Answer Comment  Length of Need Lifetime   The above medical condition requires: Patient requires the ability to reposition immediately   Head must be elevated greater than: 30 degrees   Bed type Semi-electric   Support Surface: Gel Overlay      08/24/22 1448              Discharge Care Instructions  (From admission, onward)           Start     Ordered   08/27/22 0000  Discharge wound care:        08/27/22 1325             The results of significant diagnostics from this hospitalization (including imaging, microbiology, ancillary and laboratory) are listed below for reference.    Procedures and Diagnostic Studies:   DG Pelvis Portable  Result Date: 08/24/2022 CLINICAL DATA:  Postop check EXAM: PORTABLE PELVIS 1 VIEWS COMPARISON:  Pelvic radiograph dated July 27, 2022 FINDINGS: Postsurgical changes from open reduction of the left hip. Arthroplasty components appear in their expected alignment. Prior intramedullary rod fixation of right proximal femoral neck fracture. No periprosthetic fracture is identified. Expected postoperative changes within the overlying soft tissues. IMPRESSION: Postsurgical  changes from open reduction of the left hip arthroplasty. Electronically Signed   By: Yetta Glassman M.D.   On: 08/24/2022 14:41   DG HIP UNILAT WITH PELVIS 1V LEFT  Result Date: 08/24/2022 CLINICAL DATA:  Status post open reduction of left hip. EXAM: DG HIP (WITH OR WITHOUT PELVIS) 1V*L* COMPARISON:  08/23/2022 FINDINGS: Previous left hip arthroplasty. Single there is been interval relocation of the left hip prosthesis. No signs of fracture. IMPRESSION: Interval reduction of left hip dislocation. Electronically Signed   By: Kerby Moors M.D.   On: 08/24/2022 13:28   DG C-Arm 1-60 Min-No Report  Result Date: 08/24/2022 Fluoroscopy was utilized by the requesting physician.  No radiographic interpretation.   DG C-Arm 1-60 Min-No Report  Result Date: 08/24/2022 Fluoroscopy was utilized by the requesting physician.  No radiographic interpretation.   DG Hip Unilat With Pelvis 2-3 Views Left  Result Date: 08/23/2022 CLINICAL DATA:  Left hip pain and dislocation. EXAM: DG HIP (WITH OR WITHOUT PELVIS) 2-3V LEFT COMPARISON:  July 27, 2022. FINDINGS: Status post left total hip arthroplasty. 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 No definite fracture is noted. 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 Electronically Signed   By: Marijo Conception M.D.   On: 08/23/2022 13:59     Labs:   Basic Metabolic Panel: Recent Labs  Lab 08/23/22 1250 08/24/22 0336 08/26/22 0316 08/27/22 1009  NA 138 137 133* 132*  K 3.7 4.1 3.9 4.7  CL 102 104 102 99  CO2 '25 25 24 25  '$ GLUCOSE 420* 327* 298* 333*  BUN 16 22 24* 18  CREATININE  0.75 0.61 0.73 0.74  CALCIUM 8.8* 8.9 8.1* 8.2*  MG  --   --  1.4*  --   PHOS  --   --  3.2  --    GFR Estimated Creatinine Clearance: 95.7 mL/min (by C-G formula based on SCr of 0.74 mg/dL). Liver Function Tests: Recent Labs  Lab 08/24/22 0336 08/26/22 0316  AST 11* 13*  ALT 13 10  ALKPHOS 111 71  BILITOT 0.9  0.5  PROT 5.9* 4.6*  ALBUMIN 2.8* 2.0*   No results for input(s): "LIPASE", "AMYLASE" in the last 168 hours. No results for input(s): "AMMONIA" in the last 168 hours. Coagulation profile No results for input(s): "INR", "PROTIME" in the last 168 hours.  CBC: Recent Labs  Lab 08/23/22 1250 08/24/22 0336 08/26/22 0316 08/27/22 1155  WBC 7.6 7.9 7.7 7.2  NEUTROABS 5.4  --  4.9  --   HGB 10.8* 9.7* 7.5* 8.5*  HCT 34.5* 31.3* 23.8* 27.4*  MCV 96.9 98.7 97.9 98.2  PLT 195 152 135* 193   Cardiac Enzymes: No results for input(s): "CKTOTAL", "CKMB", "CKMBINDEX", "TROPONINI" in the last 168 hours. BNP: Invalid input(s): "POCBNP" CBG: Recent Labs  Lab 08/27/22 1222 08/27/22 1637 08/27/22 2057 08/28/22 0743 08/28/22 1204  GLUCAP 275* 241* 263* 275* 311*   D-Dimer No results for input(s): "DDIMER" in the last 72 hours. Hgb A1c No results for input(s): "HGBA1C" in the last 72 hours. Lipid Profile No results for input(s): "CHOL", "HDL", "LDLCALC", "TRIG", "CHOLHDL", "LDLDIRECT" in the last 72 hours. Thyroid function studies No results for input(s): "TSH", "T4TOTAL", "T3FREE", "THYROIDAB" in the last 72 hours.  Invalid input(s): "FREET3" Anemia work up No results for input(s): "VITAMINB12", "FOLATE", "FERRITIN", "TIBC", "IRON", "RETICCTPCT" in the last 72 hours. Microbiology Recent Results (from the past 240 hour(s))  Surgical PCR screen     Status: Abnormal   Collection Time: 08/24/22 12:16 AM   Specimen: Nasal Mucosa; Nasal Swab  Result Value Ref Range Status   MRSA, PCR POSITIVE (A) NEGATIVE Final   Staphylococcus aureus POSITIVE (A) NEGATIVE Final    Comment: (NOTE) The Xpert SA Assay (FDA approved for NASAL specimens in patients 53 years of age and older), is one component of a comprehensive surveillance program. It is not intended to diagnose infection nor to guide or monitor treatment. Performed at [facility], [place] [address] [place],  [place] [address]     Time coordinating discharge: 35 minutes  Signed: Marlowe Aschoff Samaira Holzworth  [facility] 08/28/2022, 12:18 PM

## 2022-08-28 NOTE — Plan of Care (Signed)
  Problem: Education: Goal: Knowledge of General Education information will improve Description Including pain rating scale, medication(s)/side effects and non-pharmacologic comfort measures Outcome: Progressing   Problem: Health Behavior/Discharge Planning: Goal: Ability to manage health-related needs will improve Outcome: Progressing   

## 2022-08-28 NOTE — TOC Transition Note (Signed)
Transition of Care [facility]) - CM/SW Discharge Note  Patient Details  Name: Micheal Moore. MRN: [id number] Date of Birth: [birth year]  Transition of Care [facility]) CM/SW Contact:  Sherie Don, LCSW Phone Number: 08/28/2022, 4:09 PM  Clinical Narrative: Patient will need to go home via [place], which has been scheduled. Patient will also resume [place] services with [facility] (PT, OT, RN). HH orders are in. Wife asked about Medicaid, transportation to medical appointments, and a physician coming to the home. CSW added [place] for wife to call regarding Medicaid eligibility. CSW recommended that wife call PCP to see if virtual visits can be provided and also that wife call patient's insurance to see if it cover non-emergent transportation to appointments. TOC signing off.    Final next level of care: [place] Barriers to Discharge: Barriers Resolved  Patient Goals and CMS Choice Patient states their goals for this hospitalization and ongoing recovery are:: Discharge home with hospital bed CMS Medicare.gov Compare Post Acute Care list provided to:: Patient Represenative (must comment) Sydnee Cabal (spouse)) Choice offered to / list presented to : Patient, Spouse  Discharge Plan and Services In-house Referral: Clinical Social Work Post Acute Care Choice: Durable Medical Equipment          DME Arranged: Hospital bed, Gel overlay DME Agency: [employer] Date DME Agency Contacted: 08/24/22 Time DME Agency Contacted: [id number] Representative spoke with at DME Agency: Erasmo [place]: RN, PT, OT [facility] Agency: [place] Date [place]: 08/28/22 Representative spoke with at [place]: Claiborne Billings (active prior to admission)  Social Determinants of Health (SDOH) Interventions    Readmission Risk Interventions    07/25/2022    2:42 PM  Readmission Risk Prevention Plan  Transportation Screening   PCP or Specialist Appt within 5-7 [place] Screening   Medication Review (RN CM)      Information is confidential and restricted. Go to Review Flowsheets to unlock data.

## 2022-08-28 NOTE — Consult Note (Addendum)
[place] Nurse Consult Note: Reason for Consult: Consult requested for bilat buttocks/sacrum/perineum.  Pt has red moist macerated skin to the affected areas; appearance is consistent with moisture associated skin damage.  He currently has a Purewick in place to attempt to contain the urine.   ICD-10 CM Codes for Irritant Dermatitis L24A2 - Due to fecal, urinary or dual incontinence L30.4  - Erythema intertrigo. dermatitis due to sweating and friction, genital/thigh intertrigo.   There is also a red macular papular rash across bilat buttocks/sacrum of unknown etiology; there is no dermatologist available in the [facility] system for inpatients and this is beyond the scope of practice for [place] nurses.  Dressing procedure/placement/frequency: Topical treatment orders provided for bedside nurses to perform as follows to repel moisture and promote drying and healing: Apply barrier cream and antifungal powder to bilat buttocks and perineum with each turning and cleaning session. Please re-consult if further assistance is needed.  Thank-you,  Julien Girt MSN, [place], [place], [place], [place]

## 2022-08-29 ENCOUNTER — Encounter (HOSPITAL_COMMUNITY): Payer: Self-pay | Admitting: Orthopedic Surgery

## 2022-08-30 ENCOUNTER — Telehealth: Payer: Self-pay | Admitting: *Deleted

## 2022-08-30 NOTE — Progress Notes (Signed)
  Care Coordination  Outreach Note  08/30/2022 Name: Micheal Moore. MRN: [id number] DOB: December 27, 1950   Care Coordination Outreach Attempts: An unsuccessful telephone outreach was attempted today to offer the patient information about available care coordination services as a benefit of their health plan.   Follow Up Plan:  Additional outreach attempts will be made to offer the patient care coordination information and services.   Encounter Outcome:  No Answer  [place]  Direct Dial: [phone]

## 2022-08-31 ENCOUNTER — Ambulatory Visit: Payer: Self-pay

## 2022-08-31 DIAGNOSIS — R6889 Other general symptoms and signs: Secondary | ICD-10-CM | POA: Diagnosis not present

## 2022-08-31 NOTE — Patient Outreach (Signed)
  Care Coordination   Initial Visit Note   08/31/2022 Name: Demetrice Combes. MRN: [id number] DOB: 09-Sep-1951  [name]. is a 71 y.o. year old male who sees [place], Annie Main, MD for primary care. I spoke with Sydnee Cabal by phone today.  What matters to the patients health and wellness today?  Patient would like to participate in the [place] program but would like a call back next week.     Goals Addressed             This Visit's Progress    RN Care Coordination Activities: further follow up needed       Care Coordination Interventions: Spoke with patient's wife [name] who stated patient is interested in participating in the [place] program, she would like a call back when she has more time to complete the call due patient has a [facility] nurse in the home at this time Scheduled next RN CC attempt for 09/03/22 '@10'$  AM           SDOH assessments and interventions completed:  No     Care Coordination Interventions:  Yes, provided   Follow up plan: Follow up call scheduled for 10/04/21 '@10'$  AM    Encounter Outcome:  Pt. Visit Completed

## 2022-08-31 NOTE — Progress Notes (Signed)
  Care Coordination   Note   08/31/2022 Name: Micheal Moore. MRN: [id number] DOB: [birth year]  [name]. is a 71 y.o. year old male who sees [place], Annie Main, MD for primary care. I reached out to [employer]. by phone today to offer care coordination services.  Mr. Mcadam was given information about Care Coordination services today including:   The Care Coordination services include support from the care team which includes your Nurse Coordinator, Clinical Social Worker, or Pharmacist.  The Care Coordination team is here to help remove barriers to the health concerns and goals most important to you. Care Coordination services are voluntary, and the patient may decline or stop services at any time by request to their care team member.   Care Coordination Consent Status: Patient agreed to services and verbal consent obtained.   Follow up plan:  Telephone appointment with care coordination team member scheduled for:  08/31/22  Encounter Outcome:  Pt. Scheduled  [place]  Direct Dial: [phone]

## 2022-08-31 NOTE — Patient Instructions (Signed)
Visit Information  Thank you for taking time to visit with me today. Please don't hesitate to contact me if I can be of assistance to you.   Following are the goals we discussed today:   Goals Addressed             This Visit's Progress    RN Care Coordination Activities: further follow up needed       Care Coordination Interventions: Spoke with patient's wife [name] who stated patient is interested in participating in the [place] program, she would like a call back when she has more time to complete the call due patient has a [facility] nurse in the home at this time Scheduled next RN CC attempt for 09/03/22 '@10'$  AM           Our next appointment is by telephone on 10/04/21 at 10 AM  Please call the care guide team at [phone] if you need to cancel or reschedule your appointment.   If you are experiencing a Mental Health or [place] or need someone to talk to, please call 1-800-273-TALK (toll free, 24 hour hotline)  The patient verbalized understanding of instructions, educational materials, and care plan provided today and agreed to receive a mailed copy of patient instructions, educational materials, and care plan.   Barb Merino, RN, BSN, CCM Care Management Coordinator [facility] Care Management Direct Phone: [phone]

## 2022-09-06 DIAGNOSIS — I509 Heart failure, unspecified: Secondary | ICD-10-CM | POA: Diagnosis not present

## 2022-09-06 DIAGNOSIS — L8990 Pressure ulcer of unspecified site, unspecified stage: Secondary | ICD-10-CM | POA: Diagnosis not present

## 2022-09-06 DIAGNOSIS — S72002A Fracture of unspecified part of neck of left femur, initial encounter for closed fracture: Secondary | ICD-10-CM | POA: Diagnosis not present

## 2022-09-06 DIAGNOSIS — E1140 Type 2 diabetes mellitus with diabetic neuropathy, unspecified: Secondary | ICD-10-CM | POA: Diagnosis not present

## 2022-09-06 DIAGNOSIS — R296 Repeated falls: Secondary | ICD-10-CM | POA: Diagnosis not present

## 2022-09-06 DIAGNOSIS — E871 Hypo-osmolality and hyponatremia: Secondary | ICD-10-CM | POA: Diagnosis not present

## 2022-09-06 DIAGNOSIS — S73005A Unspecified dislocation of left hip, initial encounter: Secondary | ICD-10-CM | POA: Diagnosis not present

## 2022-09-06 DIAGNOSIS — E785 Hyperlipidemia, unspecified: Secondary | ICD-10-CM | POA: Diagnosis not present

## 2022-09-06 DIAGNOSIS — D62 Acute posthemorrhagic anemia: Secondary | ICD-10-CM | POA: Diagnosis not present

## 2022-10-01 ENCOUNTER — Emergency Department (HOSPITAL_COMMUNITY)
Admission: EM | Admit: 2022-10-01 | Discharge: 2022-10-01 | Disposition: A | Discharge status: Home / Self Care | Payer: Medicare HMO | Attending: Emergency Medicine | Admitting: Emergency Medicine

## 2022-10-01 ENCOUNTER — Encounter (HOSPITAL_COMMUNITY): Payer: Self-pay | Admitting: Emergency Medicine

## 2022-10-01 ENCOUNTER — Emergency Department (HOSPITAL_COMMUNITY): Payer: Medicare HMO

## 2022-10-01 ENCOUNTER — Other Ambulatory Visit: Payer: Self-pay

## 2022-10-01 DIAGNOSIS — R000 Tachycardia, unspecified: Secondary | ICD-10-CM | POA: Diagnosis not present

## 2022-10-01 DIAGNOSIS — Z7984 Long term (current) use of oral hypoglycemic drugs: Secondary | ICD-10-CM | POA: Insufficient documentation

## 2022-10-01 DIAGNOSIS — R55 Syncope and collapse: Secondary | ICD-10-CM | POA: Insufficient documentation

## 2022-10-01 DIAGNOSIS — I951 Orthostatic hypotension: Secondary | ICD-10-CM

## 2022-10-01 DIAGNOSIS — R112 Nausea with vomiting, unspecified: Secondary | ICD-10-CM | POA: Diagnosis not present

## 2022-10-01 DIAGNOSIS — R110 Nausea: Secondary | ICD-10-CM | POA: Diagnosis not present

## 2022-10-01 DIAGNOSIS — Z7982 Long term (current) use of aspirin: Secondary | ICD-10-CM | POA: Insufficient documentation

## 2022-10-01 DIAGNOSIS — E119 Type 2 diabetes mellitus without complications: Secondary | ICD-10-CM | POA: Insufficient documentation

## 2022-10-01 DIAGNOSIS — I499 Cardiac arrhythmia, unspecified: Secondary | ICD-10-CM | POA: Diagnosis not present

## 2022-10-01 DIAGNOSIS — R531 Weakness: Secondary | ICD-10-CM | POA: Diagnosis not present

## 2022-10-01 DIAGNOSIS — R569 Unspecified convulsions: Secondary | ICD-10-CM | POA: Diagnosis not present

## 2022-10-01 DIAGNOSIS — Z7401 Bed confinement status: Secondary | ICD-10-CM | POA: Diagnosis not present

## 2022-10-01 DIAGNOSIS — R6889 Other general symptoms and signs: Secondary | ICD-10-CM | POA: Diagnosis not present

## 2022-10-01 DIAGNOSIS — R739 Hyperglycemia, unspecified: Secondary | ICD-10-CM | POA: Diagnosis not present

## 2022-10-01 DIAGNOSIS — R0689 Other abnormalities of breathing: Secondary | ICD-10-CM | POA: Diagnosis not present

## 2022-10-01 LAB — CBC WITH DIFFERENTIAL/PLATELET
Abs Immature Granulocytes: 0.03 10*3/uL (ref 0.00–0.07)
Basophils Absolute: 0 10*3/uL (ref 0.0–0.1)
Basophils Relative: 1 %
Eosinophils Absolute: 0.2 10*3/uL (ref 0.0–0.5)
Eosinophils Relative: 2 %
HCT: 39.6 % (ref 39.0–52.0)
Hemoglobin: 13 g/dL (ref 13.0–17.0)
Immature Granulocytes: 0 %
Lymphocytes Relative: 50 %
Lymphs Abs: 3.5 10*3/uL (ref 0.7–4.0)
MCH: 30.9 pg (ref 26.0–34.0)
MCHC: 32.8 g/dL (ref 30.0–36.0)
MCV: 94.1 fL (ref 80.0–100.0)
Monocytes Absolute: 0.3 10*3/uL (ref 0.1–1.0)
Monocytes Relative: 5 %
Neutro Abs: 3 10*3/uL (ref 1.7–7.7)
Neutrophils Relative %: 42 %
Platelets: 277 10*3/uL (ref 150–400)
RBC: 4.21 MIL/uL — ABNORMAL LOW (ref 4.22–5.81)
RDW: 14.4 % (ref 11.5–15.5)
WBC: 7 10*3/uL (ref 4.0–10.5)
nRBC: 0 % (ref 0.0–0.2)

## 2022-10-01 LAB — BASIC METABOLIC PANEL
Anion gap: 19 — ABNORMAL HIGH (ref 5–15)
Anion gap: 15 (ref 5–15)
BUN: 23 mg/dL (ref 8–23)
BUN: 19 mg/dL (ref 8–23)
CO2: 16 mmol/L — ABNORMAL LOW (ref 22–32)
CO2: 23 mmol/L (ref 22–32)
Calcium: 9.1 mg/dL (ref 8.9–10.3)
Calcium: 9.2 mg/dL (ref 8.9–10.3)
Chloride: 95 mmol/L — ABNORMAL LOW (ref 98–111)
Chloride: 94 mmol/L — ABNORMAL LOW (ref 98–111)
Creatinine, Ser: 1.05 mg/dL (ref 0.61–1.24)
Creatinine, Ser: 0.92 mg/dL (ref 0.61–1.24)
GFR, Estimated: >60 mL/min (ref >60)
GFR, Estimated: >60 mL/min (ref >60)
Glucose, Bld: 313 mg/dL — ABNORMAL HIGH (ref 70–99)
Glucose, Bld: 190 mg/dL — ABNORMAL HIGH (ref 70–99)
Potassium: 4.4 mmol/L (ref 3.5–5.1)
Potassium: 4.5 mmol/L (ref 3.5–5.1)
Sodium: 130 mmol/L — ABNORMAL LOW (ref 135–145)
Sodium: 132 mmol/L — ABNORMAL LOW (ref 135–145)

## 2022-10-01 LAB — TROPONIN I (HIGH SENSITIVITY)
Troponin I (High Sensitivity): 5 ng/L (ref <18)
Troponin I (High Sensitivity): 15 ng/L (ref <18)

## 2022-10-01 LAB — LIPASE, BLOOD: Lipase: 29 U/L (ref 11–51)

## 2022-10-01 LAB — D-DIMER, QUANTITATIVE: D-Dimer, Quant: 9.22 ug/mL-FEU — ABNORMAL HIGH (ref 0.00–0.50)

## 2022-10-01 MED ORDER — ONDANSETRON HCL 4 MG/2ML IJ SOLN
4.0000 mg | Freq: Once | INTRAMUSCULAR | Status: AC
  Administered 2022-10-01: 4 mg via INTRAVENOUS
  Filled 2022-10-01: qty 2

## 2022-10-01 MED ORDER — IOHEXOL 350 MG/ML SOLN
75.0000 mL | Freq: Once | INTRAVENOUS | Status: AC | PRN
  Administered 2022-10-01: 75 mL via INTRAVENOUS

## 2022-10-01 MED ORDER — SODIUM CHLORIDE 0.9 % IV BOLUS
1000.0000 mL | Freq: Once | INTRAVENOUS | Status: AC
  Administered 2022-10-01: 1000 mL via INTRAVENOUS

## 2022-10-01 NOTE — Discharge Instructions (Addendum)
Staples were removed. Hydrate well. No blood clots. You were dehydrated.  Please return to the ER if your symptoms worsen; you have increased pain, fevers, chills, inability to keep any medications down, confusion.

## 2022-10-01 NOTE — ED Triage Notes (Signed)
Pt arrives via EMS from home episode of shaking and unresponsive for 3 sec. Pt came to. Cbg 374, bp 108/60, hr 118, 521m NS and '4mg'$  zofran PTA.

## 2022-10-01 NOTE — ED Notes (Signed)
ED Provider at bedside. 

## 2022-10-01 NOTE — ED Provider Notes (Signed)
[facility] EMERGENCY DEPARTMENT Provider Note   CSN: [id number] Arrival date & time: 10/01/22  1342     History  Chief Complaint  Patient presents with   Loss of Consciousness    Micheal Moore. is a 72 y.o. male with history history of orthostatic hypotension, type 2 diabetes, high cholesterol, DVT, frequent falls, recent hospitalization for close dislocation of the left hip which underwent reduction 08/24/22 with Dr Delfino Lovett, presenting from skilled nursing facility with concern for near syncope.  Per history provided by the patient as well as the facility, he was being helped out of bed and became lightheaded and nearly passed out.  Patient denies complete loss of consciousness.  He says he woke up feeling extremely nauseated has been vomiting since then.  He denies chest pain.  Per discharge facility note, the patient previously been on Eliquis but is currently not taking it and instead is on DVT prophylaxis with aspirin.  He has a history of acute blood loss anemia with a hemoglobin baseline between 9 and 10.  HPI     Home Medications Prior to Admission medications   Medication Sig Start Date End Date Taking? Authorizing Provider  aspirin EC 325 MG tablet Take 325 mg by mouth daily.    [provider]  Ensure Max Protein (ENSURE MAX PROTEIN) LIQD Take 330 mLs (11 oz total) by mouth 2 (two) times daily. 08/28/22   Terrilee Croak, MD  fludrocortisone (FLORINEF) 0.1 MG tablet Take 2 tablets (0.2 mg total) by mouth daily. 03/05/22   Lavina Hamman, MD  glimepiride (AMARYL) 2 MG tablet Take 2 mg by mouth daily.  01/26/19   [provider]  JARDIANCE 10 MG TABS tablet Take 10 mg by mouth daily. Patient not taking: Reported on 08/23/2022 07/05/22   [provider]  midodrine (PROAMATINE) 5 MG tablet Take 0.5 tablets (2.5 mg total) by mouth 3 (three) times daily with meals. 07/25/22   Eugenie Filler, MD  nystatin (MYCOSTATIN/NYSTOP)  powder Apply 1 Application topically 3 (three) times daily. In the groin 08/28/22   Dahal, Marlowe Aschoff, MD  polyethylene glycol (MIRALAX / GLYCOLAX) 17 g packet Take 17 g by mouth daily. Patient not taking: Reported on 07/19/2022 03/05/22   Lavina Hamman, MD  senna-docusate (SENOKOT-S) 8.6-50 MG tablet Take 1 tablet by mouth daily. 08/27/22 11/25/22  Terrilee Croak, MD      Allergies    Patient has no known allergies.    Review of Systems   Review of Systems  Physical Exam Updated Vital Signs BP 103/70 (BP Location: Right Arm)   Pulse 100   Temp 97.7 F (36.5 C) (Oral)   Resp 18   Ht '6\' 5"'$  (1.956 m)   Wt 86.2 kg   SpO2 98%   BMI 22.53 kg/m  Physical Exam Constitutional:      General: He is not in acute distress.    Comments: Thin, frail, vomiting in the room  HENT:     Head: Normocephalic and atraumatic.  Eyes:     Conjunctiva/sclera: Conjunctivae normal.     Pupils: Pupils are equal, round, and reactive to light.  Cardiovascular:     Rate and Rhythm: Normal rate and regular rhythm.  Pulmonary:     Effort: Pulmonary effort is normal. No respiratory distress.  Abdominal:     General: There is no distension.     Tenderness: There is no abdominal tenderness.  Skin:    General: Skin is warm  and dry.  Neurological:     General: No focal deficit present.     Mental Status: He is alert. Mental status is at baseline.  Psychiatric:        Mood and Affect: Mood normal.        Behavior: Behavior normal.     ED Results / Procedures / Treatments   Labs (all labs ordered are listed, but only abnormal results are displayed) Labs Reviewed  D-DIMER, QUANTITATIVE - Abnormal; Notable for the following components:      Result Value   D-Dimer, Quant 9.22 (*)    All other components within normal limits  BASIC METABOLIC PANEL - Abnormal; Notable for the following components:   Sodium 130 (*)    Chloride 95 (*)    CO2 16 (*)    Glucose, Bld 313 (*)    Anion gap 19 (*)    All other  components within normal limits  CBC WITH DIFFERENTIAL/PLATELET - Abnormal; Notable for the following components:   RBC 4.21 (*)    All other components within normal limits  RESP PANEL BY RT-PCR (RSV, FLU A&B, COVID)  RVPGX2  LIPASE, BLOOD  TROPONIN I (HIGH SENSITIVITY)  TROPONIN I (HIGH SENSITIVITY)    EKG EKG Interpretation  Date/Time:  Monday October 01 2022 13:50:03 EST Ventricular Rate:  101 PR Interval:  102 QRS Duration: 78 QT Interval:  350 QTC Calculation: 454 R Axis:   5 Text Interpretation: Sinus tachycardia Probable left atrial enlargement RSR' in V1 or V2, probably normal variant Left ventricular hypertrophy  Inferior q waves, lead III Confirmed by Octaviano Glow [phone]) on 10/01/2022 1:57:17 PM  Radiology DG Chest Portable 1 View  Result Date: 10/01/2022 CLINICAL DATA:  Syncope. EXAM: PORTABLE CHEST 1 VIEW COMPARISON:  07/19/2022 FINDINGS: Lungs are adequately inflated and otherwise clear. Cardiomediastinal silhouette and remainder of the exam is unchanged. IMPRESSION: No active disease. Electronically Signed   By: Marin Olp M.D.   On: 10/01/2022 15:03    Procedures Procedures    Medications Ordered in ED Medications  ondansetron (ZOFRAN) injection 4 mg (4 mg Intravenous Given 10/01/22 1426)  sodium chloride 0.9 % bolus 1,000 mL (1,000 mLs Intravenous New Bag/Given 10/01/22 1426)    ED Course/ Medical Decision Making/ A&P Clinical Course as of 10/01/22 1601  Mon Oct 01, 2022  1539 Signed out to Dr Kathrynn Humble pending labs, CT PE study [MT]    Clinical Course User Index [MT] Amariyah Bazar, Carola Rhine, MD                             Medical Decision Making Amount and/or Complexity of Data Reviewed Labs: ordered. Radiology: ordered.  Risk Prescription drug management.   This patient presents to the ED with concern for near syncope. This involves an extensive number of treatment options, and is a complaint that carries with it a high risk of complications and  morbidity.  The differential diagnosis includes orthostatic hypotension versus vasovagal episode versus pulmonary embolism versus anemia versus arrhythmia versus other  Co-morbidities that complicate the patient evaluation: History of from recent DVT not on anticoagulation due to GI bleeding  Additional history obtained from EMS  External records from outside source obtained and reviewed including most recent hospital discharge summary as noted below  I ordered and personally interpreted labs.  The pertinent results include:  Ddimer 9.22, trop 5, hgb wnl.  I ordered imaging studies including CT PE study, x-ray of  the chest I independently visualized and interpreted imaging which showed no significant abnormalities on chest x-ray, CT scan pending at the time of signout I agree with the radiologist interpretation  The patient was maintained on a cardiac monitor.  I personally viewed and interpreted the cardiac monitored which showed an underlying rhythm of: Sinus rhythm  Per my interpretation the patient's ECG shows sinus rhythm with S1Q3T3 pattern, new from prior tracings, which may suggest right heart strain or pulmonary embolism   Test Considered: Patient was not having abdominal tenderness or leukocytosis suggest an acute intra-abdominal process and warrant emergent imaging of the abdomen at this time.  He has no neurological symptoms to suggest stroke at this time.  After the interventions noted above, I reevaluated the patient and found that they have: stayed the same          Final Clinical Impression(s) / ED Diagnoses Final diagnoses:  None    Rx / DC Orders ED Discharge Orders     None         Yulitza Shorts, Carola Rhine, MD 10/01/22 1601

## 2022-10-01 NOTE — ED Provider Notes (Signed)
  Physical Exam  BP 103/70 (BP Location: Right Arm)   Pulse 100   Temp 97.7 F (36.5 C) (Oral)   Resp 18   Ht '6\' 5"'$  (1.956 m)   Wt 86.2 kg   SpO2 98%   BMI 22.53 kg/m   Physical Exam  Procedures  Procedures  ED Course / MDM   Clinical Course as of 10/01/22 1543  Mon Oct 01, 2022  1539 Signed out to Dr Micheal Moore pending labs, CT PE study [MT]    Clinical Course User Index [MT] Trifan, Carola Rhine, MD   Medical Decision Making Amount and/or Complexity of Data Reviewed Labs: ordered. Radiology: ordered.  Risk Prescription drug management.   [name]. Recent admission for hip dislocation and discharged from SNF. Hx of DVT - not on AC. Hx of DM. S1q3t3 noted. + emesis post syncope. ? Episode of shaking - no post ictal phase in the ER. No vertigo.   CT PE ordered - if neg, he can be discharged. No cardiac dz history and pt had no cardiac prodrome. He also reports that the ysncope was preceded by standing up.          Micheal Biles, MD 10/01/22 [phone]

## 2022-10-01 NOTE — ED Notes (Signed)
Patient transported to CT 

## 2022-10-01 NOTE — ED Notes (Signed)
Overdue Troponin and BMP collected & sent to lab

## 2023-01-15 ENCOUNTER — Other Ambulatory Visit: Payer: Self-pay

## 2023-01-15 ENCOUNTER — Inpatient Hospital Stay (HOSPITAL_COMMUNITY)
Admission: EM | Admit: 2023-01-15 | Discharge: 2023-01-20 | DRG: 321 | Disposition: A | Discharge status: Home Health | Payer: Medicare HMO | Attending: Internal Medicine | Admitting: Internal Medicine

## 2023-01-15 ENCOUNTER — Encounter (HOSPITAL_COMMUNITY)
Admission: EM | Disposition: A | Discharge status: Home Health | Payer: Self-pay | Source: Home / Self Care | Attending: Internal Medicine

## 2023-01-15 ENCOUNTER — Encounter (HOSPITAL_COMMUNITY): Payer: Self-pay | Admitting: Internal Medicine

## 2023-01-15 ENCOUNTER — Telehealth (HOSPITAL_COMMUNITY): Payer: Self-pay | Admitting: Pharmacy Technician

## 2023-01-15 ENCOUNTER — Other Ambulatory Visit (HOSPITAL_COMMUNITY): Payer: Self-pay

## 2023-01-15 DIAGNOSIS — I5032 Chronic diastolic (congestive) heart failure: Secondary | ICD-10-CM | POA: Diagnosis present

## 2023-01-15 DIAGNOSIS — G8929 Other chronic pain: Secondary | ICD-10-CM | POA: Diagnosis present

## 2023-01-15 DIAGNOSIS — L89612 Pressure ulcer of right heel, stage 2: Secondary | ICD-10-CM | POA: Diagnosis present

## 2023-01-15 DIAGNOSIS — C4361 Malignant melanoma of right upper limb, including shoulder: Secondary | ICD-10-CM | POA: Diagnosis not present

## 2023-01-15 DIAGNOSIS — I213 ST elevation (STEMI) myocardial infarction of unspecified site: Secondary | ICD-10-CM | POA: Diagnosis present

## 2023-01-15 DIAGNOSIS — R54 Age-related physical debility: Secondary | ICD-10-CM | POA: Diagnosis present

## 2023-01-15 DIAGNOSIS — L97929 Non-pressure chronic ulcer of unspecified part of left lower leg with unspecified severity: Secondary | ICD-10-CM | POA: Diagnosis present

## 2023-01-15 DIAGNOSIS — R739 Hyperglycemia, unspecified: Secondary | ICD-10-CM | POA: Diagnosis not present

## 2023-01-15 DIAGNOSIS — I739 Peripheral vascular disease, unspecified: Secondary | ICD-10-CM | POA: Diagnosis not present

## 2023-01-15 DIAGNOSIS — Z7982 Long term (current) use of aspirin: Secondary | ICD-10-CM

## 2023-01-15 DIAGNOSIS — L89896 Pressure-induced deep tissue damage of other site: Secondary | ICD-10-CM | POA: Diagnosis present

## 2023-01-15 DIAGNOSIS — I2510 Atherosclerotic heart disease of native coronary artery without angina pectoris: Secondary | ICD-10-CM | POA: Diagnosis not present

## 2023-01-15 DIAGNOSIS — Z85820 Personal history of malignant melanoma of skin: Secondary | ICD-10-CM | POA: Diagnosis not present

## 2023-01-15 DIAGNOSIS — Z96642 Presence of left artificial hip joint: Secondary | ICD-10-CM | POA: Diagnosis present

## 2023-01-15 DIAGNOSIS — L8990 Pressure ulcer of unspecified site, unspecified stage: Secondary | ICD-10-CM | POA: Diagnosis not present

## 2023-01-15 DIAGNOSIS — Z86718 Personal history of other venous thrombosis and embolism: Secondary | ICD-10-CM | POA: Diagnosis not present

## 2023-01-15 DIAGNOSIS — F17210 Nicotine dependence, cigarettes, uncomplicated: Secondary | ICD-10-CM | POA: Diagnosis present

## 2023-01-15 DIAGNOSIS — E871 Hypo-osmolality and hyponatremia: Secondary | ICD-10-CM | POA: Diagnosis present

## 2023-01-15 DIAGNOSIS — E785 Hyperlipidemia, unspecified: Secondary | ICD-10-CM | POA: Diagnosis present

## 2023-01-15 DIAGNOSIS — I700 Atherosclerosis of aorta: Secondary | ICD-10-CM | POA: Diagnosis not present

## 2023-01-15 DIAGNOSIS — R6889 Other general symptoms and signs: Secondary | ICD-10-CM | POA: Diagnosis not present

## 2023-01-15 DIAGNOSIS — Z955 Presence of coronary angioplasty implant and graft: Secondary | ICD-10-CM

## 2023-01-15 DIAGNOSIS — E1140 Type 2 diabetes mellitus with diabetic neuropathy, unspecified: Secondary | ICD-10-CM | POA: Diagnosis not present

## 2023-01-15 DIAGNOSIS — E1143 Type 2 diabetes mellitus with diabetic autonomic (poly)neuropathy: Secondary | ICD-10-CM | POA: Diagnosis not present

## 2023-01-15 DIAGNOSIS — Z833 Family history of diabetes mellitus: Secondary | ICD-10-CM

## 2023-01-15 DIAGNOSIS — Z79899 Other long term (current) drug therapy: Secondary | ICD-10-CM

## 2023-01-15 DIAGNOSIS — I2584 Coronary atherosclerosis due to calcified coronary lesion: Secondary | ICD-10-CM | POA: Diagnosis not present

## 2023-01-15 DIAGNOSIS — D72829 Elevated white blood cell count, unspecified: Secondary | ICD-10-CM | POA: Diagnosis present

## 2023-01-15 DIAGNOSIS — E876 Hypokalemia: Secondary | ICD-10-CM | POA: Diagnosis present

## 2023-01-15 DIAGNOSIS — L89312 Pressure ulcer of right buttock, stage 2: Secondary | ICD-10-CM | POA: Diagnosis present

## 2023-01-15 DIAGNOSIS — S81802A Unspecified open wound, left lower leg, initial encounter: Secondary | ICD-10-CM | POA: Diagnosis not present

## 2023-01-15 DIAGNOSIS — R296 Repeated falls: Secondary | ICD-10-CM | POA: Diagnosis present

## 2023-01-15 DIAGNOSIS — I2119 ST elevation (STEMI) myocardial infarction involving other coronary artery of inferior wall: Principal | ICD-10-CM

## 2023-01-15 DIAGNOSIS — S98112A Complete traumatic amputation of left great toe, initial encounter: Secondary | ICD-10-CM | POA: Diagnosis not present

## 2023-01-15 DIAGNOSIS — I878 Other specified disorders of veins: Secondary | ICD-10-CM | POA: Diagnosis present

## 2023-01-15 DIAGNOSIS — E1151 Type 2 diabetes mellitus with diabetic peripheral angiopathy without gangrene: Secondary | ICD-10-CM | POA: Diagnosis present

## 2023-01-15 DIAGNOSIS — Z7401 Bed confinement status: Secondary | ICD-10-CM | POA: Diagnosis not present

## 2023-01-15 DIAGNOSIS — I951 Orthostatic hypotension: Secondary | ICD-10-CM | POA: Diagnosis present

## 2023-01-15 DIAGNOSIS — E1110 Type 2 diabetes mellitus with ketoacidosis without coma: Secondary | ICD-10-CM | POA: Diagnosis not present

## 2023-01-15 DIAGNOSIS — I6523 Occlusion and stenosis of bilateral carotid arteries: Secondary | ICD-10-CM | POA: Diagnosis not present

## 2023-01-15 DIAGNOSIS — Z7984 Long term (current) use of oral hypoglycemic drugs: Secondary | ICD-10-CM

## 2023-01-15 DIAGNOSIS — L97919 Non-pressure chronic ulcer of unspecified part of right lower leg with unspecified severity: Secondary | ICD-10-CM | POA: Diagnosis not present

## 2023-01-15 DIAGNOSIS — Z1152 Encounter for screening for COVID-19: Secondary | ICD-10-CM

## 2023-01-15 DIAGNOSIS — Z89412 Acquired absence of left great toe: Secondary | ICD-10-CM

## 2023-01-15 DIAGNOSIS — E860 Dehydration: Secondary | ICD-10-CM | POA: Diagnosis present

## 2023-01-15 DIAGNOSIS — R627 Adult failure to thrive: Secondary | ICD-10-CM | POA: Diagnosis present

## 2023-01-15 DIAGNOSIS — I70232 Atherosclerosis of native arteries of right leg with ulceration of calf: Secondary | ICD-10-CM | POA: Diagnosis not present

## 2023-01-15 DIAGNOSIS — I70235 Atherosclerosis of native arteries of right leg with ulceration of other part of foot: Secondary | ICD-10-CM | POA: Diagnosis not present

## 2023-01-15 DIAGNOSIS — I2111 ST elevation (STEMI) myocardial infarction involving right coronary artery: Principal | ICD-10-CM | POA: Diagnosis present

## 2023-01-15 DIAGNOSIS — E861 Hypovolemia: Secondary | ICD-10-CM | POA: Diagnosis present

## 2023-01-15 DIAGNOSIS — Z7952 Long term (current) use of systemic steroids: Secondary | ICD-10-CM

## 2023-01-15 DIAGNOSIS — Z823 Family history of stroke: Secondary | ICD-10-CM

## 2023-01-15 DIAGNOSIS — I70245 Atherosclerosis of native arteries of left leg with ulceration of other part of foot: Secondary | ICD-10-CM | POA: Diagnosis not present

## 2023-01-15 DIAGNOSIS — E1165 Type 2 diabetes mellitus with hyperglycemia: Secondary | ICD-10-CM | POA: Diagnosis not present

## 2023-01-15 HISTORY — PX: CORONARY/GRAFT ACUTE MI REVASCULARIZATION: CATH118305

## 2023-01-15 HISTORY — PX: LEFT HEART CATH AND CORONARY ANGIOGRAPHY: CATH118249

## 2023-01-15 HISTORY — DX: Type 2 diabetes mellitus with ketoacidosis without coma: E11.10

## 2023-01-15 LAB — URINALYSIS, ROUTINE W REFLEX MICROSCOPIC
Bilirubin Urine: NEGATIVE
Glucose, UA: >=500 mg/dL — AB
Hgb urine dipstick: NEGATIVE
Ketones, ur: 5 mg/dL — AB
Leukocytes,Ua: NEGATIVE
Nitrite: NEGATIVE
Protein, ur: NEGATIVE mg/dL
Specific Gravity, Urine: 1.033 — ABNORMAL HIGH (ref 1.005–1.030)
pH: 5 (ref 5.0–8.0)

## 2023-01-15 LAB — TROPONIN I (HIGH SENSITIVITY)
Troponin I (High Sensitivity): 2670 ng/L (ref <18)
Troponin I (High Sensitivity): 6766 ng/L (ref <18)

## 2023-01-15 LAB — BASIC METABOLIC PANEL
Anion gap: 16 — ABNORMAL HIGH (ref 5–15)
Anion gap: 15 (ref 5–15)
Anion gap: 16 — ABNORMAL HIGH (ref 5–15)
BUN: 13 mg/dL (ref 8–23)
BUN: 13 mg/dL (ref 8–23)
BUN: 13 mg/dL (ref 8–23)
CO2: 21 mmol/L — ABNORMAL LOW (ref 22–32)
CO2: 21 mmol/L — ABNORMAL LOW (ref 22–32)
CO2: 19 mmol/L — ABNORMAL LOW (ref 22–32)
Calcium: 9.7 mg/dL (ref 8.9–10.3)
Calcium: 9.6 mg/dL (ref 8.9–10.3)
Calcium: 9.6 mg/dL (ref 8.9–10.3)
Chloride: 99 mmol/L (ref 98–111)
Chloride: 100 mmol/L (ref 98–111)
Chloride: 101 mmol/L (ref 98–111)
Creatinine, Ser: 0.9 mg/dL (ref 0.61–1.24)
Creatinine, Ser: 0.88 mg/dL (ref 0.61–1.24)
Creatinine, Ser: 0.75 mg/dL (ref 0.61–1.24)
GFR, Estimated: >60 mL/min (ref >60)
GFR, Estimated: >60 mL/min (ref >60)
GFR, Estimated: >60 mL/min (ref >60)
Glucose, Bld: 390 mg/dL — ABNORMAL HIGH (ref 70–99)
Glucose, Bld: 261 mg/dL — ABNORMAL HIGH (ref 70–99)
Glucose, Bld: 162 mg/dL — ABNORMAL HIGH (ref 70–99)
Potassium: 3.8 mmol/L (ref 3.5–5.1)
Potassium: 3.9 mmol/L (ref 3.5–5.1)
Potassium: 4.4 mmol/L (ref 3.5–5.1)
Sodium: 136 mmol/L (ref 135–145)
Sodium: 136 mmol/L (ref 135–145)
Sodium: 136 mmol/L (ref 135–145)

## 2023-01-15 LAB — CBG MONITORING, ED: Glucose-Capillary: >600 mg/dL (ref 70–99)

## 2023-01-15 LAB — COMPREHENSIVE METABOLIC PANEL
ALT: 11 U/L (ref 0–44)
AST: 20 U/L (ref 15–41)
Albumin: 3.2 g/dL — ABNORMAL LOW (ref 3.5–5.0)
Alkaline Phosphatase: 103 U/L (ref 38–126)
Anion gap: 19 — ABNORMAL HIGH (ref 5–15)
BUN: 14 mg/dL (ref 8–23)
CO2: 15 mmol/L — ABNORMAL LOW (ref 22–32)
Calcium: 9.6 mg/dL (ref 8.9–10.3)
Chloride: 96 mmol/L — ABNORMAL LOW (ref 98–111)
Creatinine, Ser: 1.23 mg/dL (ref 0.61–1.24)
GFR, Estimated: >60 mL/min (ref >60)
Glucose, Bld: 622 mg/dL (ref 70–99)
Potassium: 3.9 mmol/L (ref 3.5–5.1)
Sodium: 130 mmol/L — ABNORMAL LOW (ref 135–145)
Total Bilirubin: 1.1 mg/dL (ref 0.3–1.2)
Total Protein: 6.7 g/dL (ref 6.5–8.1)

## 2023-01-15 LAB — CBC WITH DIFFERENTIAL/PLATELET
Abs Immature Granulocytes: 0.22 10*3/uL — ABNORMAL HIGH (ref 0.00–0.07)
Basophils Absolute: 0.1 10*3/uL (ref 0.0–0.1)
Basophils Relative: 0 %
Eosinophils Absolute: 0.1 10*3/uL (ref 0.0–0.5)
Eosinophils Relative: 1 %
HCT: 40.6 % (ref 39.0–52.0)
Hemoglobin: 13.2 g/dL (ref 13.0–17.0)
Immature Granulocytes: 2 %
Lymphocytes Relative: 16 %
Lymphs Abs: 2.4 10*3/uL (ref 0.7–4.0)
MCH: 30.6 pg (ref 26.0–34.0)
MCHC: 32.5 g/dL (ref 30.0–36.0)
MCV: 94 fL (ref 80.0–100.0)
Monocytes Absolute: 0.8 10*3/uL (ref 0.1–1.0)
Monocytes Relative: 5 %
Neutro Abs: 11.6 10*3/uL — ABNORMAL HIGH (ref 1.7–7.7)
Neutrophils Relative %: 76 %
Platelets: 279 10*3/uL (ref 150–400)
RBC: 4.32 MIL/uL (ref 4.22–5.81)
RDW: 13.3 % (ref 11.5–15.5)
WBC: 15.1 10*3/uL — ABNORMAL HIGH (ref 4.0–10.5)
nRBC: 0 % (ref 0.0–0.2)

## 2023-01-15 LAB — GLUCOSE, CAPILLARY
Glucose-Capillary: 113 mg/dL — ABNORMAL HIGH (ref 70–99)
Glucose-Capillary: 125 mg/dL — ABNORMAL HIGH (ref 70–99)
Glucose-Capillary: 128 mg/dL — ABNORMAL HIGH (ref 70–99)
Glucose-Capillary: 139 mg/dL — ABNORMAL HIGH (ref 70–99)
Glucose-Capillary: 206 mg/dL — ABNORMAL HIGH (ref 70–99)
Glucose-Capillary: 278 mg/dL — ABNORMAL HIGH (ref 70–99)
Glucose-Capillary: 362 mg/dL — ABNORMAL HIGH (ref 70–99)
Glucose-Capillary: 451 mg/dL — ABNORMAL HIGH (ref 70–99)
Glucose-Capillary: 477 mg/dL — ABNORMAL HIGH (ref 70–99)

## 2023-01-15 LAB — BETA-HYDROXYBUTYRIC ACID
Beta-Hydroxybutyric Acid: 1.85 mmol/L — ABNORMAL HIGH (ref 0.05–0.27)
Beta-Hydroxybutyric Acid: 0.37 mmol/L — ABNORMAL HIGH (ref 0.05–0.27)
Beta-Hydroxybutyric Acid: 0.09 mmol/L (ref 0.05–0.27)

## 2023-01-15 LAB — POCT I-STAT, CHEM 8
BUN: 13 mg/dL (ref 8–23)
Calcium, Ion: 1.23 mmol/L (ref 1.15–1.40)
Chloride: 81 mmol/L — ABNORMAL LOW (ref 98–111)
Creatinine, Ser: 0.4 mg/dL — ABNORMAL LOW (ref 0.61–1.24)
Glucose, Bld: 438 mg/dL — ABNORMAL HIGH (ref 70–99)
HCT: 36 % — ABNORMAL LOW (ref 39.0–52.0)
Hemoglobin: 12.2 g/dL — ABNORMAL LOW (ref 13.0–17.0)
Potassium: 3.1 mmol/L — ABNORMAL LOW (ref 3.5–5.1)
Sodium: 115 mmol/L — CL (ref 135–145)
TCO2: 20 mmol/L — ABNORMAL LOW (ref 22–32)

## 2023-01-15 LAB — PROTIME-INR
INR: 1.1 (ref 0.8–1.2)
Prothrombin Time: 14.4 seconds (ref 11.4–15.2)

## 2023-01-15 LAB — HEMOGLOBIN A1C
Hgb A1c MFr Bld: 13.3 % — ABNORMAL HIGH (ref 4.8–5.6)
Mean Plasma Glucose: 335.01 mg/dL

## 2023-01-15 LAB — POCT ACTIVATED CLOTTING TIME: Activated Clotting Time: 314 seconds

## 2023-01-15 LAB — APTT: aPTT: 24 seconds (ref 24–36)

## 2023-01-15 LAB — MRSA NEXT GEN BY PCR, NASAL: MRSA by PCR Next Gen: DETECTED — AB

## 2023-01-15 LAB — SARS CORONAVIRUS 2 BY RT PCR: SARS Coronavirus 2 by RT PCR: NEGATIVE

## 2023-01-15 SURGERY — LEFT HEART CATH AND CORONARY ANGIOGRAPHY
Anesthesia: LOCAL

## 2023-01-15 MED ORDER — SODIUM CHLORIDE 0.9 % IV BOLUS
INTRAVENOUS | Status: AC | PRN
  Administered 2023-01-15: 250 mL via INTRAVENOUS

## 2023-01-15 MED ORDER — ACETAMINOPHEN 325 MG PO TABS: 650.0000 mg | ORAL_TABLET | ORAL | Status: DC | PRN

## 2023-01-15 MED ORDER — MEDIHONEY WOUND/BURN DRESSING EX PSTE
1.0000 | PASTE | Freq: Every day | CUTANEOUS | Status: DC
  Administered 2023-01-15 – 2023-01-20 (×6): 1 via TOPICAL
  Filled 2023-01-15 (×2): qty 44

## 2023-01-15 MED ORDER — LIDOCAINE HCL (PF) 1 % IJ SOLN
INTRAMUSCULAR | Status: AC
  Filled 2023-01-15: qty 30

## 2023-01-15 MED ORDER — POLYETHYLENE GLYCOL 3350 17 G PO PACK: 17.0000 g | PACK | Freq: Every day | ORAL | Status: DC | PRN

## 2023-01-15 MED ORDER — FENTANYL CITRATE (PF) 100 MCG/2ML IJ SOLN
INTRAMUSCULAR | Status: DC | PRN
  Administered 2023-01-15: 25 ug via INTRAVENOUS

## 2023-01-15 MED ORDER — HYDRALAZINE HCL 20 MG/ML IJ SOLN: 10.0000 mg | INTRAMUSCULAR | Status: AC | PRN

## 2023-01-15 MED ORDER — TICAGRELOR 90 MG PO TABS
90.0000 mg | ORAL_TABLET | Freq: Two times a day (BID) | ORAL | Status: DC
  Administered 2023-01-15 – 2023-01-20 (×10): 90 mg via ORAL
  Filled 2023-01-15 (×10): qty 1

## 2023-01-15 MED ORDER — SODIUM CHLORIDE 0.9% FLUSH
3.0000 mL | Freq: Two times a day (BID) | INTRAVENOUS | Status: DC
  Administered 2023-01-17 – 2023-01-18 (×4): 3 mL via INTRAVENOUS

## 2023-01-15 MED ORDER — ONDANSETRON HCL 4 MG/2ML IJ SOLN: 4.0000 mg | Freq: Four times a day (QID) | INTRAMUSCULAR | Status: DC | PRN

## 2023-01-15 MED ORDER — ORAL CARE MOUTH RINSE: 15.0000 mL | OROMUCOSAL | Status: DC | PRN

## 2023-01-15 MED ORDER — LACTATED RINGERS IV SOLN: INTRAVENOUS | Status: DC

## 2023-01-15 MED ORDER — HEPARIN SODIUM (PORCINE) 1000 UNIT/ML IJ SOLN
INTRAMUSCULAR | Status: AC
  Filled 2023-01-15: qty 10

## 2023-01-15 MED ORDER — SODIUM CHLORIDE 0.9% FLUSH: 3.0000 mL | INTRAVENOUS | Status: DC | PRN

## 2023-01-15 MED ORDER — SODIUM CHLORIDE 0.9 % IV SOLN: INTRAVENOUS | Status: AC

## 2023-01-15 MED ORDER — MIDAZOLAM HCL 2 MG/2ML IJ SOLN
INTRAMUSCULAR | Status: DC | PRN
  Administered 2023-01-15: 1 mg via INTRAVENOUS

## 2023-01-15 MED ORDER — INSULIN REGULAR(HUMAN) IN NACL 100-0.9 UT/100ML-% IV SOLN
INTRAVENOUS | Status: DC
  Administered 2023-01-15: 2.2 [IU]/h via INTRAVENOUS
  Administered 2023-01-15: 15 [IU]/h via INTRAVENOUS
  Filled 2023-01-15 (×3): qty 100

## 2023-01-15 MED ORDER — MIDODRINE HCL 5 MG PO TABS: 2.5000 mg | ORAL_TABLET | Freq: Three times a day (TID) | ORAL | Status: DC

## 2023-01-15 MED ORDER — POTASSIUM CHLORIDE 10 MEQ/100ML IV SOLN
10.0000 meq | INTRAVENOUS | Status: AC
  Administered 2023-01-15 (×2): 10 meq via INTRAVENOUS
  Filled 2023-01-15 (×2): qty 100

## 2023-01-15 MED ORDER — ASPIRIN 81 MG PO CHEW
81.0000 mg | CHEWABLE_TABLET | Freq: Every day | ORAL | Status: DC
  Administered 2023-01-16 – 2023-01-20 (×5): 81 mg via ORAL
  Filled 2023-01-15 (×5): qty 1

## 2023-01-15 MED ORDER — HEPARIN SODIUM (PORCINE) 1000 UNIT/ML IJ SOLN
INTRAMUSCULAR | Status: DC | PRN
  Administered 2023-01-15: 5000 [IU] via INTRAVENOUS

## 2023-01-15 MED ORDER — VERAPAMIL HCL 2.5 MG/ML IV SOLN
INTRAVENOUS | Status: AC
  Filled 2023-01-15: qty 2

## 2023-01-15 MED ORDER — ONDANSETRON HCL 4 MG/2ML IJ SOLN: 4.0000 mg | Freq: Once | INTRAMUSCULAR | Status: DC

## 2023-01-15 MED ORDER — ASPIRIN 81 MG PO TBEC
243.0000 mg | DELAYED_RELEASE_TABLET | Freq: Once | ORAL | Status: DC
  Filled 2023-01-15: qty 3

## 2023-01-15 MED ORDER — HEPARIN SODIUM (PORCINE) 5000 UNIT/ML IJ SOLN
INTRAMUSCULAR | Status: AC
  Filled 2023-01-15: qty 1

## 2023-01-15 MED ORDER — DEXTROSE IN LACTATED RINGERS 5 % IV SOLN: INTRAVENOUS | Status: DC

## 2023-01-15 MED ORDER — LACTATED RINGERS IV BOLUS
20.0000 mL/kg | Freq: Once | INTRAVENOUS | Status: AC
  Administered 2023-01-15: 1000 mL via INTRAVENOUS

## 2023-01-15 MED ORDER — DOCUSATE SODIUM 100 MG PO CAPS: 100.0000 mg | ORAL_CAPSULE | Freq: Two times a day (BID) | ORAL | Status: DC | PRN

## 2023-01-15 MED ORDER — IOHEXOL 350 MG/ML SOLN
INTRAVENOUS | Status: DC | PRN
  Administered 2023-01-15: 80 mL

## 2023-01-15 MED ORDER — TICAGRELOR 90 MG PO TABS
ORAL_TABLET | ORAL | Status: AC
  Filled 2023-01-15: qty 2

## 2023-01-15 MED ORDER — VERAPAMIL HCL 2.5 MG/ML IV SOLN
INTRAVENOUS | Status: DC | PRN
  Administered 2023-01-15: 5 mg via INTRAVENOUS

## 2023-01-15 MED ORDER — MIDODRINE HCL 5 MG PO TABS
2.5000 mg | ORAL_TABLET | Freq: Three times a day (TID) | ORAL | Status: DC
  Administered 2023-01-15 – 2023-01-20 (×14): 2.5 mg via ORAL
  Filled 2023-01-15 (×12): qty 1

## 2023-01-15 MED ORDER — HEPARIN SODIUM (PORCINE) 5000 UNIT/ML IJ SOLN: 4000.0000 [IU] | Freq: Once | INTRAMUSCULAR | Status: DC

## 2023-01-15 MED ORDER — FENTANYL CITRATE (PF) 100 MCG/2ML IJ SOLN
INTRAMUSCULAR | Status: AC
  Filled 2023-01-15: qty 2

## 2023-01-15 MED ORDER — GERHARDT'S BUTT CREAM
TOPICAL_CREAM | CUTANEOUS | Status: DC | PRN
  Administered 2023-01-15: 1 via TOPICAL
  Filled 2023-01-15: qty 1

## 2023-01-15 MED ORDER — MUPIROCIN 2 % EX OINT
1.0000 | TOPICAL_OINTMENT | Freq: Two times a day (BID) | CUTANEOUS | Status: AC
  Administered 2023-01-15 – 2023-01-20 (×10): 1 via NASAL
  Filled 2023-01-15 (×3): qty 22

## 2023-01-15 MED ORDER — ATORVASTATIN CALCIUM 80 MG PO TABS
80.0000 mg | ORAL_TABLET | Freq: Every day | ORAL | Status: DC
  Administered 2023-01-15 – 2023-01-20 (×6): 80 mg via ORAL
  Filled 2023-01-15 (×6): qty 1

## 2023-01-15 MED ORDER — POTASSIUM CHLORIDE 10 MEQ/100ML IV SOLN: 10.0000 meq | INTRAVENOUS | Status: DC

## 2023-01-15 MED ORDER — HEPARIN (PORCINE) IN NACL 1000-0.9 UT/500ML-% IV SOLN
INTRAVENOUS | Status: DC | PRN
  Administered 2023-01-15 (×2): 500 mL

## 2023-01-15 MED ORDER — ASPIRIN 81 MG PO CHEW
243.0000 mg | CHEWABLE_TABLET | Freq: Once | ORAL | Status: AC
  Administered 2023-01-15: 243 mg via ORAL
  Filled 2023-01-15: qty 3

## 2023-01-15 MED ORDER — CHLORHEXIDINE GLUCONATE CLOTH 2 % EX PADS
6.0000 | MEDICATED_PAD | Freq: Every day | CUTANEOUS | Status: DC
  Administered 2023-01-19: 6 via TOPICAL

## 2023-01-15 MED ORDER — ONDANSETRON HCL 4 MG/2ML IJ SOLN
INTRAMUSCULAR | Status: AC
  Administered 2023-01-15: 4 mg via INTRAVENOUS
  Filled 2023-01-15: qty 2

## 2023-01-15 MED ORDER — TICAGRELOR 90 MG PO TABS
ORAL_TABLET | ORAL | Status: DC | PRN
  Administered 2023-01-15: 180 mg via ORAL

## 2023-01-15 MED ORDER — MIDAZOLAM HCL 2 MG/2ML IJ SOLN
INTRAMUSCULAR | Status: AC
  Filled 2023-01-15: qty 2

## 2023-01-15 MED ORDER — DEXTROSE 50 % IV SOLN: 0.0000 mL | INTRAVENOUS | Status: DC | PRN

## 2023-01-15 MED ORDER — LIDOCAINE HCL (PF) 1 % IJ SOLN
INTRAMUSCULAR | Status: DC | PRN
  Administered 2023-01-15: 10 mL

## 2023-01-15 MED ORDER — ACETAMINOPHEN 325 MG PO TABS
650.0000 mg | ORAL_TABLET | ORAL | Status: DC | PRN
  Administered 2023-01-17: 650 mg via ORAL
  Filled 2023-01-15: qty 2

## 2023-01-15 MED ORDER — ENOXAPARIN SODIUM 40 MG/0.4ML IJ SOSY
40.0000 mg | PREFILLED_SYRINGE | INTRAMUSCULAR | Status: DC
  Administered 2023-01-15 – 2023-01-19 (×5): 40 mg via SUBCUTANEOUS
  Filled 2023-01-15 (×5): qty 0.4

## 2023-01-15 MED ORDER — ASPIRIN 325 MG PO TABS
ORAL_TABLET | ORAL | Status: AC
  Filled 2023-01-15: qty 1

## 2023-01-15 MED ORDER — SODIUM CHLORIDE 0.9 % IV SOLN: 250.0000 mL | INTRAVENOUS | Status: DC | PRN

## 2023-01-15 MED ORDER — LABETALOL HCL 5 MG/ML IV SOLN: 10.0000 mg | INTRAVENOUS | Status: AC | PRN

## 2023-01-15 SURGICAL SUPPLY — 20 items
BALLN EMERGE MR 2.5X12 (BALLOONS) ×1
BALLN ~~LOC~~ EMERGE MR 4.0X12 (BALLOONS) ×1
BALLOON EMERGE MR 2.5X12 (BALLOONS) IMPLANT
BALLOON ~~LOC~~ EMERGE MR 4.0X12 (BALLOONS) IMPLANT
CATH 5FR JL3.5 JR4 ANG PIG MP (CATHETERS) IMPLANT
CATH INFINITI 6F ANG MULTIPACK (CATHETERS) IMPLANT
CATH VISTA GUIDE 6FR JR4 (CATHETERS) IMPLANT
DEVICE RAD COMP TR BAND LRG (VASCULAR PRODUCTS) IMPLANT
GLIDESHEATH SLEND SS 6F .021 (SHEATH) IMPLANT
GUIDEWIRE INQWIRE 1.5J.035X260 (WIRE) IMPLANT
GUIDEWIRE VAS SION BLUE 190 (WIRE) IMPLANT
INQWIRE 1.5J .035X260CM (WIRE) ×1
KIT HEART LEFT (KITS) ×1 IMPLANT
PACK CARDIAC CATHETERIZATION (CUSTOM PROCEDURE TRAY) ×1 IMPLANT
SHEATH PROBE COVER 6X72 (BAG) IMPLANT
STENT SYNERGY XD 4.0X16 (Permanent Stent) IMPLANT
SYNERGY XD 4.0X16 (Permanent Stent) ×1 IMPLANT
SYR MEDRAD MARK 7 150ML (SYRINGE) ×1 IMPLANT
TRANSDUCER W/STOPCOCK (MISCELLANEOUS) ×1 IMPLANT
TUBING CIL FLEX 10 FLL-RA (TUBING) ×1 IMPLANT

## 2023-01-15 NOTE — Progress Notes (Signed)
Date and time results received: 01/15/23 1705   Test:MRSA PCR Critical Value: Positive    Orders Received? Or Actions Taken?: Standing orders entered for MRSA positive swab in the nares.

## 2023-01-15 NOTE — ED Notes (Signed)
Assisted pt with getting in stretcher from wheelchair, additional staff at bedside. Pt placed in bed and hooked to monitor. BP in the 70s systolic, 1L LR started. Pt Aoz4 reports feeling dizzy and nauseous.  ED provider at bedside assessing pt. Code stemi called @1124  Pt connected to pads on zoll.  Cardiology PA Irene Limbo at bedside, cath lab RN, and cardiologist all at bedside.

## 2023-01-15 NOTE — H&P (Addendum)
Cardiology Admission History and Physical   Patient ID: Micheal Moore. MRN: [id number]; DOB: Sep 21, 1950   Admission date: 01/15/2023  PCP:  [name], MD   [facility] Providers Cardiologist:  Dietrich Pates, MD   {   Chief Complaint:  inferior STEMI  Patient Profile:   Micheal Moore. is a 72 y.o. male with a history of syncope felt to be secondary to orthostasis on Midodrine, type 2 diabetes mellitus, s/p left great toe amputation in 04/2020 in setting of osteomyelitis, remote DVT no longer on anticoagulation, and prior tobacco abuse who is being seen 01/15/2023 for the evaluation of STEMI.  History of Present Illness:   Micheal Moore is a 72 year old male with the above history. Patients he has been in his usual state of health until this morning when he was feeling a little nauseous. He went to his PCP's office for a routine appointment and was found to have severely elevated blood glucose level in the 500s. Therefore, he was sent to the ED. EKG on arrival shows normal sinus rhythm with ST elevations in inferior leads as well as lead V6 with ST depressions in leads aVL and V1-V4 (most prominent in aVL and V2). RN does report patient also complained of dizziness on arrival. He had an episode of emesis in the ED. He denies any chest pain or shortness of breath. He reported prior tobacco abuse but states he quit about 8 months ago.   He was given 4,000 units of Heparin, Aspirin 324mg  daily, and Zofran in the ED and then transported to cath lab for emergent cardiac catheterization.  Past Medical History:  Diagnosis Date   Cancer (HCC)    right elbow melanoma   Diabetes mellitus without complication (HCC)    Type II   Dizziness    DVT (deep venous thrombosis) (HCC)    right leg   Frequent falls    Near syncope    Syncope     Past Surgical History:  Procedure Laterality Date   AMPUTATION TOE Left 04/28/2020   Procedure: AMPUTATION LEFT GREAT  TOE;  Surgeon: Nadara Mustard, MD;  Location: [facility] OR;  Service: Orthopedics;  Laterality: Left;   INTRAMEDULLARY (IM) NAIL INTERTROCHANTERIC Right 02/23/2022   Procedure: INTRAMEDULLARY (IM) NAIL INTERTROCHANTRIC, RIGHT;  Surgeon: Samson Frederic, MD;  Location: [facility];  Service: Orthopedics;  Laterality: Right;   MELANOMA EXCISION WITH SENTINEL LYMPH NODE BIOPSY Right 08/19/2020   Procedure: WIDE LOCAL EXCISION RIGHT ELBOW MELANOMA WITH RIGHT AXILLARY SENTINEL LYMPH NODE BIOPSY;  Surgeon: Fritzi Mandes, MD;  Location: [facility] OR;  Service: General;  Laterality: Right;  2nd incision in axilla   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   Procedure: TOTAL HIP ARTHROPLASTY ANTERIOR APPROACH;  Surgeon: Samson Frederic, MD;  Location: [facility];  Service: Orthopedics;  Laterality: Left;   TOTAL HIP ARTHROPLASTY Left 08/24/2022   Procedure: OPEN REDUCTION, HEAD AND LINER EXCHANGE;  Surgeon: Samson Frederic, MD;  Location: [facility];  Service: Orthopedics;  Laterality: Left;     Medications Prior to Admission: Prior to Admission medications   Medication Sig Start Date End Date Taking? Authorizing Provider  aspirin EC 325 MG tablet Take 325 mg by mouth daily.    [provider]  Ensure Max Protein (ENSURE MAX PROTEIN) LIQD Take 330 mLs (11 oz total) by mouth 2 (two) times daily. 08/28/22   Lorin Glass, MD  fludrocortisone (FLORINEF) 0.1 MG tablet Take 2 tablets (0.2 mg total) by  mouth daily. 03/05/22   Rolly Salter, MD  glimepiride (AMARYL) 2 MG tablet Take 2 mg by mouth daily.  01/26/19   [provider]  JARDIANCE 10 MG TABS tablet Take 10 mg by mouth daily. Patient not taking: Reported on 08/23/2022 07/05/22   [provider]  midodrine (PROAMATINE) 5 MG tablet Take 0.5 tablets (2.5 mg total) by mouth 3 (three) times daily with meals. 07/25/22   Rodolph Bong, MD  nystatin (MYCOSTATIN/NYSTOP) powder Apply 1 Application topically 3 (three) times daily. In the groin 08/28/22   Dahal,  Melina Schools, MD  polyethylene glycol (MIRALAX / GLYCOLAX) 17 g packet Take 17 g by mouth daily. Patient not taking: Reported on 07/19/2022 03/05/22   Rolly Salter, MD     Allergies:   No Known Allergies  Social History:   Social History   Socioeconomic History   Marital status: Married    Spouse name: [name]   Number of children: 1   Years of education: Not on file   Highest education level: Associate degree: occupational, Scientist, product/process development, or vocational program  Occupational History    Comment: retired  Tobacco Use   Smoking status: Every Day    Packs/day: 0.20    Years: 19.00    Additional pack years: 0.00    Total pack years: 3.80    Types: Cigarettes   Smokeless tobacco: Never  Vaping Use   Vaping Use: Never used  Substance and Sexual Activity   Alcohol use: No   Drug use: Not Currently   Sexual activity: Not on file  Other Topics Concern   Not on file  Social History Narrative   Lives with wife   Social Determinants of Health   Financial Resource Strain: Not on file  Food Insecurity: No Food Insecurity (08/23/2022)   Hunger Vital Sign    Worried About Running Out of Food in the Last Year: Never true    Ran Out of Food in the Last Year: Never true  Transportation Needs: No Transportation Needs (08/23/2022)   PRAPARE - Administrator, Civil Service (Medical): No    Lack of Transportation (Non-Medical): No  Physical Activity: Not on file  Stress: Not on file  Social Connections: Not on file  Intimate Partner Violence: Not At Risk (08/23/2022)   Humiliation, Afraid, Rape, and Kick questionnaire    Fear of Current or Ex-Partner: No    Emotionally Abused: No    Physically Abused: No    Sexually Abused: No    Family History:   The patient's family history includes Diabetes in his father and sister; Stroke in his mother and sister.    ROS:  Please see the history of present illness.  Unable to get full ROS due to need for emergent cardiac catheterization.     Physical Exam/Data:   Vitals:   01/15/23 1110 01/15/23 1113  BP:  101/67  Pulse:  90  Resp:  18  Temp:  (!) 97.3 F (36.3 C)  TempSrc:  Oral  SpO2: 100% 95%  Weight: 87 kg   Height: 6\' 5"  (1.956 m)    No intake or output data in the 24 hours ending 01/15/23 1215    01/15/2023   11:10 AM 10/01/2022    1:51 PM 08/23/2022    7:05 PM  Last 3 Weights  Weight (lbs) 191 lb 12.8 oz 190 lb 176 lb 2.4 oz  Weight (kg) 87 kg 86.183 kg 79.9 kg     Body mass  index is 22.74 kg/m.   Physical Exam per MD:  General: 72 y.o. Caucasian male resting comfortably in no acute distress. Appears disheveled.  HEENT: Normocephalic and atraumatic. Sclera clear. Neck: Supple. No carotid bruits. No JVD. Heart: RRR. Distinct S1 and S2. No murmurs, gallops, or rubs. Radial  pulses 2+ and equal bilaterally. Left distal pedal pulses 2+ but no palpable distal pedal pulse on the right.  Lungs: No increased work of breathing. Clear to ausculation bilaterally. No wheezes, rhonchi, or rales.  Abdomen: Soft, non-distended, and non-tender to palpation.  Extremities: No lower extremity edema. Open bleed wound on lateral aspect of left lower extremity. S/p amputation of left great toe. Skin: Warm and dry. Neuro: Alert and oriented x3. No focal deficits. Psych: Normal affect. Responds appropriately.   EKG:  The ECG that was done  was personally reviewed and demonstrates normal sinus rhythm, rate 85 bpm, with  ST elevations in inferior leads as well as lead V6 with ST depressions in leads aVL and V1-V4 (most prominent in aVL and V2).  Relevant CV Studies:  Echocardiogram 12/19/2021: Impressions: 1. Left ventricular ejection fraction, by estimation, is 50 to 55%. The  left ventricle has low normal function. Left ventricular endocardial  border not optimally defined to evaluate regional wall motion. Left  ventricular diastolic parameters are  consistent with Grade I diastolic dysfunction (impaired relaxation).    2. Right ventricular systolic function is normal. The right ventricular  size is normal.   3. The mitral valve is grossly normal. Trivial mitral valve  regurgitation.   4. The aortic valve is tricuspid. There is mild calcification of the  aortic valve. Aortic valve regurgitation is not visualized. Aortic valve  sclerosis/calcification is present, without any evidence of aortic  stenosis.   5. Aortic dilatation noted. There is borderline dilatation of the aortic  root, measuring 38 mm. There is borderline dilatation of the ascending  aorta, measuring 38 mm.   6. The inferior vena cava is normal in size with greater than 50%  respiratory variability, suggesting right atrial pressure of 3 mmHg.   Comparison(s): No prior Echocardiogram.    Laboratory Data:  High Sensitivity Troponin:  No results for input(s): "TROPONINIHS" in the last 720 hours.    ChemistryNo results for input(s): "NA", "K", "CL", "CO2", "GLUCOSE", "BUN", "CREATININE", "CALCIUM", "MG", "GFRNONAA", "GFRAA", "ANIONGAP" in the last 168 hours.  No results for input(s): "PROT", "ALBUMIN", "AST", "ALT", "ALKPHOS", "BILITOT" in the last 168 hours. Lipids No results for input(s): "CHOL", "TRIG", "HDL", "LABVLDL", "LDLCALC", "CHOLHDL" in the last 168 hours. Hematology Recent Labs  Lab 01/15/23 1111  WBC 15.1*  RBC 4.32  HGB 13.2  HCT 40.6  MCV 94.0  MCH 30.6  MCHC 32.5  RDW 13.3  PLT 279   Thyroid No results for input(s): "TSH", "FREET4" in the last 168 hours. BNPNo results for input(s): "BNP", "PROBNP" in the last 168 hours.  DDimer No results for input(s): "DDIMER" in the last 168 hours.   Radiology/Studies:  No results found.   Assessment and Plan:   Inferior STEMI Patient presented from PCP's office with blood glucose of >500. He reported nausea and dizziness on arrival but no chest pain or shortness of breath. EKG showed ST elevations in ST elevations in inferior leads as well as lead V6 with ST depressions  in leads aVL and V1-V4. He was given IV Heparin, Aspirin, and Zofran in the ED and then transported to cath lab for emergent cardiac catheterization. - Will order Echo. -  CBG >600. Will check hemoglobin A1c and lipid panel. - Will continue Aspirin and start statin. - Will hold off on beta-blocker given soft BP and history of orthostasis on Midodrine.  Orthostatic Hypotension History of orthostatic hypotension with chronically low BP. On Midodrine and Florinef at home. - BP soft but stable on arrival with systolic BP in the 90s to 100s. - Continue home Midodrine 2.5mg  three times daily.  Type 2 Diabetes Mellitus DKA Hemoglobin A1c 9.3% in 09/2022. CBG > 600 on admission. Glipizide and Jardiance listed under PTA medications.  - Beta-hydroxybutyric acid pending. - Will consult PCCM to assist with management of DKA.  Lower Extremity Wound Patient has an open bleeding wound on the lateral aspect of left lower extremity as well as sore on right great toe. S/p amputation of left great toe. He states he does not remember the last time he walked. - No documentation of PAD but suspect he has this. - Will order ABIs. - Will consult wound care.  Decubitus Ulcer Patient reports a wound on his bottom but unable to confirm this as he was on the cath lab table. He is very sedentary and cannot remember the last time he walked. - Will consult wound care. - He appears very disheveled so will also go ahead and get social work involved as suspect he will need additional resources at home.  Deconditioning Patient had a total hip arthroplasty in 07/2022 after a hip fracture from a fall and then required a redo left total hip arthroplasty in 08/2022 for hip dislocation. He states he cannot remember the last time he walked - Will consult PT/OT.   Risk Assessment/Risk Scores:   TIMI Risk Score for ST  Elevation MI:   The patient's TIMI risk score is 6, which indicates a 16.1% risk of all cause mortality at  30 days.{    Severity of Illness: The appropriate patient status for this patient is INPATIENT. Inpatient status is judged to be reasonable and necessary in order to provide the required intensity of service to ensure the patient's safety. The patient's presenting symptoms, physical exam findings, and initial radiographic and laboratory data in the context of their chronic comorbidities is felt to place them at high risk for further clinical deterioration. Furthermore, it is not anticipated that the patient will be medically stable for discharge from the hospital within 2 midnights of admission.   * I certify that at the point of admission it is my clinical judgment that the patient will require inpatient hospital care spanning beyond 2 midnights from the point of admission due to high intensity of service, high risk for further deterioration and high frequency of surveillance required.*   For questions or updates, please contact [facility] Please consult [email or web address] for contact info under     Signed, Corrin Parker, PA-C  01/15/2023 12:15 PM   ATTENDING ATTESTATION:  After conducting a review of all available clinical information with the care team, interviewing the patient, and performing a physical exam, I agree with the findings and plan described in this note.   GEN: No acute distress.   HEENT:  MMM, no JVD, no scleral icterus Cardiac: RRR, no murmurs, rubs, or gallops.  Respiratory: Clear to auscultation bilaterally. GI: Soft, nontender, non-distended  MS: No edema; No deformity. Eschar left lower extremity Neuro:  Nonfocal  Vasc:  +2 radial pulses; +1 left DP pulse  The patient is a 72 year old male with a history of type 2 diabetes not on insulin,  prior remote DVT, and hyperlipidemia who presented to the emergency department with nausea of 3 hours duration. Will refer for emergency coronary angiography.  Plan on medicine consult for hyperglycemia after procedure.   Further recommendations will be informed by results of coronary angiography procedure.  I have reviewed the risks, indications, and alternatives to cardiac catheterization, possible angioplasty, and stenting with the patient. Risks include but are not limited to bleeding, infection, vascular injury, stroke, myocardial infection, arrhythmia, kidney injury, radiation-related injury in the case of prolonged fluoroscopy use, emergency cardiac surgery, and death. The patient understands the risks of serious complication is 1-2 in 1000 with diagnostic cardiac cath and 1-2% or less with angioplasty/stenting.    Alverda Skeans, MD Pager [phone]

## 2023-01-15 NOTE — ED Triage Notes (Signed)
Pt to ED via pov with c/o hyperglycemia and not feeling well. Pt stated he was sent here by his PCP due to BGL being 500. Pt reports feeling unwell this morning, c/o abd pain, nausea and vomiting, and dizziness. Pt denies any CP SOB.

## 2023-01-15 NOTE — TOC Initial Note (Signed)
Transition of Care (TOC) - Initial/Assessment Note    Patient Details  Name: Micheal Moore. MRN: [id number] Date of Birth: 11/10/1950  Transition of Care [facility]) CM/SW Contact:    Elliot Cousin, RN Phone Number: [phone] 01/15/2023, 3:51 PM  Clinical Narrative:  CM spoke to pt at bedside. Gave permission to speak to wife. Pt was independent PTA. Will continue to follow for dc needs.                  Expected Discharge Plan: Home/Self Care Barriers to Discharge: Continued Medical Work up   Patient Goals and CMS Choice Patient states their goals for this hospitalization and ongoing recovery are:: wants to remain independent          Expected Discharge Plan and Services   Discharge Planning Services: CM Consult   Living arrangements for the past 2 months: Single Family Home                   Prior Living Arrangements/Services Living arrangements for the past 2 months: Single Family Home Lives with:: Spouse Patient language and need for interpreter reviewed:: Yes Do you feel safe going back to the place where you live?: Yes      Need for Family Participation in Patient Care: No (Comment) Care giver support system in place?: Yes (comment)   Criminal Activity/Legal Involvement Pertinent to Current Situation/Hospitalization: No - Comment as needed  Activities of Daily Living      Permission Sought/Granted Permission sought to share information with : Case Manager, Family Supports, PCP Permission granted to share information with : Yes, Verbal Permission Granted  Share Information with NAME: [name]     Permission granted to share info w Relationship: wife  Permission granted to share info w Contact Information: [phone]  Emotional Assessment Appearance:: Appears stated age Attitude/Demeanor/Rapport: Engaged Affect (typically observed): Accepting Orientation: : Oriented to Self, Oriented to Place, Oriented to  Time, Oriented to  Situation   Psych Involvement: No (comment)  Admission diagnosis:  Hyperglycemia [R73.9] ST elevation myocardial infarction (STEMI) involving other coronary artery of inferior wall (HCC) [I21.19] STEMI (ST elevation myocardial infarction) (HCC) [I21.3] Patient Active Problem List   Diagnosis Date Noted   STEMI (ST elevation myocardial infarction) (HCC) 01/15/2023   Diabetic ketoacidosis without coma associated with type 2 diabetes mellitus (HCC) 01/15/2023   Anterior dislocation of left hip (HCC) 08/24/2022   Closed dislocation of left hip (HCC) 08/23/2022   Normocytic anemia 08/23/2022   Malnutrition of moderate degree 07/20/2022   Fall    Orthostatic hypotension    Hip fracture (HCC) 02/22/2022   Hyperlipidemia 02/22/2022   Chronic diastolic CHF (congestive heart failure) (HCC) 02/22/2022   Left great toe amputee (HCC) 05/06/2020   Cellulitis in diabetic foot (HCC) 04/27/2020   Osteomyelitis of great toe of left foot (HCC) 04/27/2020   Type 2 diabetes mellitus with hyperlipidemia (HCC) 04/27/2020   History of DVT (deep vein thrombosis) 04/27/2020   Tobacco abuse 04/27/2020   PCP:  [name], MD Pharmacy:   [facility] - [address]), [place] - [address] [place] ([place]) [place] [address] Phone: [phone] Fax: [phone]     Social Determinants of Health (SDOH) Social History: SDOH Screenings   Food Insecurity: No Food Insecurity (08/23/2022)  Housing: Low Risk  (08/23/2022)  Transportation Needs: No Transportation Needs (08/23/2022)  Utilities: Not At Risk (08/23/2022)  Tobacco Use: High Risk (01/15/2023)   SDOH Interventions:  Readmission Risk Interventions    07/25/2022    2:42 PM  Readmission Risk Prevention Plan  Transportation Screening   PCP or Specialist Appt within 5-7 Days   Home Care Screening   Medication Review (RN CM)      Information is confidential and restricted. Go to Review Flowsheets to  unlock data.

## 2023-01-15 NOTE — ED Notes (Signed)
Checked blood sugar it read greater than 600 notified RN of blood sugar

## 2023-01-15 NOTE — Progress Notes (Signed)
Date and time results received: 01/15/23 1741   Test: Troponin Critical Value: 2670  Name of Provider Notified: Dr. Denese Killings  Orders Received? Or Actions Taken?: Expected values. No new orders.

## 2023-01-15 NOTE — ED Notes (Signed)
Called to activate stemi

## 2023-01-15 NOTE — TOC Benefit Eligibility Note (Signed)
Patient Advocate Encounter  Insurance verification completed.    The patient is currently admitted and upon discharge could be taking Brilinta 90 mg.  The current 30 day co-pay is $45.00.   The patient is insured through [employer] Part D   This test claim was processed through [facility]- copay amounts may vary at other pharmacies due to pharmacy/plan contracts, or as the patient moves through the different stages of their insurance plan.  Seba Madole, CPHT Pharmacy Patient Advocate Specialist [facility] Patient Advocate Team Direct Number: [phone]  Fax: [phone]       

## 2023-01-15 NOTE — Consult Note (Signed)
NAME:  Micheal Hedeen., MRN:  [id number], DOB:  December 14, 1950, LOS: 0 ADMISSION DATE:  01/15/2023, CONSULTATION DATE:  4/30 REFERRING MD:  Dr. Lynnette Caffey, CHIEF COMPLAINT:  inferior STEMI   History of Present Illness:  Patient is a 72 year old male with pertinent PMH DMT2, Chronic HFpEF, syncope secondary to orthostasis, previous DVT no longer on [facility], tobacco abuse quit 8 months ago presents to [facility] ED on 4/30 for STEMI.  Patient states on 4/30 in a.m. felt nauseous.  Went to PCP and CBG found to be 500s.  Patient sent to [facility] ED for further eval.  On arrival patient EKG showing ST elevations in inferior leads.  Patient also complaining of some dizziness on arrival patient hypotensive).  Patient denies any chest pain or SOB.  Patient was given heparin, aspirin, Zofran.  Cards consulted and patient transferred to Cath Lab.  Cath showing Prox RCA 80% stenosed and Prox LAD 40% stenosed. Stent placed and patient taken to [facility] ICU.  Concern for possible DKA.  PCCM consulted for medical management.  Pertinent  Medical History   Past Medical History:  Diagnosis Date   Cancer (HCC)    right elbow melanoma   Diabetes mellitus without complication (HCC)    Type II   Dizziness    DVT (deep venous thrombosis) (HCC)    right leg   Frequent falls    Near syncope    Syncope      Significant Hospital Events: Including procedures, antibiotic start and stop dates in addition to other pertinent events   4/30 inferior STEMI; DKA  Interim History / Subjective:  See above  Objective   Blood pressure (!) 145/87, pulse (!) 0, temperature (!) 97.3 F (36.3 C), temperature source Oral, resp. rate 17, height 6\' 5"  (1.956 m), weight 87 kg, SpO2 97 %.       No intake or output data in the 24 hours ending 01/15/23 1255 Filed Weights   01/15/23 1110  Weight: 87 kg    Examination: General:  NAD HEENT: MM pink/moist; poor dentition Neuro: Aox3; MAE CV: s1s2, rate 100s, no m/r/g PULM:  dim rhonchi on  right; dim clear on left; on room air GI: soft, bsx4 active  Extremities: warm/dry, no edema  Skin: no rashes or lesions    Resolved Hospital Problem list     Assessment & Plan:  Inferior STEMI Chronic HFpEF -Cath showing Prox RCA 80% stenosed and Prox LAD 40% stenosed. Stent placed P: -Cards following; appreciate recs -Echo pending -DAPT and statin per cards -Hold BB for now w/ hx of orthostatic hypotension  DKA DMT2 P: -Insulin per endotool -CBG monitoring -IV fluid resuscitation -trend B-Hydroxy -serial bmp -check A1c  Leukocytosis -Likely reactive P: -Trend WBC/fever curve -check UA -send covid/flu  Hyponatremia -Likely hypovolemia P: -IV fluids -Trend BMP  Orthostatic hypotension P: -Resume home midodrine -hold florinef in setting of dka  LE wound Decubitus ulcer P: -WOC consult  Deconditioning P: -PT/OT   Best Practice (right click and "Reselect all SmartList Selections" daily)   Diet/type: NPO DVT prophylaxis: LMWH GI prophylaxis: N/A Lines: N/A Foley:  N/A Code Status:  full code Last date of multidisciplinary goals of care discussion (4/30 patient updated at bedside  Labs   CBC: Recent Labs  Lab 01/15/23 1111 01/15/23 1214  WBC 15.1*  --   NEUTROABS 11.6*  --   HGB 13.2 12.2*  HCT 40.6 36.0*  MCV 94.0  --   PLT 279  --  Basic Metabolic Panel: Recent Labs  Lab 01/15/23 1111 01/15/23 1214  NA 130* 115*  K 3.9 3.1*  CL 96* 81*  CO2 15*  --   GLUCOSE 622* 438*  BUN 14 13  CREATININE 1.23 0.40*  CALCIUM 9.6  --    GFR: Estimated Creatinine Clearance: 102.7 mL/min (A) (by C-G formula based on SCr of 0.4 mg/dL (L)). Recent Labs  Lab 01/15/23 1111  WBC 15.1*    Liver Function Tests: Recent Labs  Lab 01/15/23 1111  AST 20  ALT 11  ALKPHOS 103  BILITOT 1.1  PROT 6.7  ALBUMIN 3.2*   No results for input(s): "LIPASE", "AMYLASE" in the last 168 hours. No results for input(s): "AMMONIA" in the last 168  hours.  ABG    Component Value Date/Time   TCO2 20 (L) 01/15/2023 1214     Coagulation Profile: Recent Labs  Lab 01/15/23 1133  INR 1.1    Cardiac Enzymes: No results for input(s): "CKTOTAL", "CKMB", "CKMBINDEX", "TROPONINI" in the last 168 hours.  HbA1C: Hgb A1c MFr Bld  Date/Time Value Ref Range Status  08/23/2022 07:25 PM 9.3 (H) 4.8 - 5.6 % Final    Comment:    (NOTE)         Prediabetes: 5.7 - 6.4         Diabetes: >6.4         Glycemic control for adults with diabetes: <7.0   02/22/2022 10:00 PM 7.2 (H) 4.8 - 5.6 % Final    Comment:    (NOTE) Pre diabetes:          5.7%-6.4%  Diabetes:              >6.4%  Glycemic control for   <7.0% adults with diabetes     CBG: Recent Labs  Lab 01/15/23 1137  GLUCAP >600*    Review of Systems:   Review of Systems  Constitutional:  Negative for chills and fever.  Respiratory:  Negative for sputum production and shortness of breath.   Cardiovascular:  Negative for chest pain.  Gastrointestinal:  Positive for nausea and vomiting. Negative for abdominal pain, constipation and diarrhea.  Neurological:  Positive for dizziness. Negative for loss of consciousness.     Past Medical History:  He,  has a past medical history of Cancer (HCC), Diabetes mellitus without complication (HCC), Dizziness, DVT (deep venous thrombosis) (HCC), Frequent falls, Near syncope, and Syncope.   Surgical History:   Past Surgical History:  Procedure Laterality Date   AMPUTATION TOE Left 04/28/2020   Procedure: AMPUTATION LEFT GREAT TOE;  Surgeon: Nadara Mustard, MD;  Location: [facility] OR;  Service: Orthopedics;  Laterality: Left;   INTRAMEDULLARY (IM) NAIL INTERTROCHANTERIC Right 02/23/2022   Procedure: INTRAMEDULLARY (IM) NAIL INTERTROCHANTRIC, RIGHT;  Surgeon: Samson Frederic, MD;  Location: [facility] ORS;  Service: Orthopedics;  Laterality: Right;   MELANOMA EXCISION WITH SENTINEL LYMPH NODE BIOPSY Right 08/19/2020   Procedure: WIDE LOCAL EXCISION  RIGHT ELBOW MELANOMA WITH RIGHT AXILLARY SENTINEL LYMPH NODE BIOPSY;  Surgeon: Fritzi Mandes, MD;  Location: [facility] OR;  Service: General;  Laterality: Right;  2nd incision in axilla   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   Procedure: TOTAL HIP ARTHROPLASTY ANTERIOR APPROACH;  Surgeon: Samson Frederic, MD;  Location: [facility] ORS;  Service: Orthopedics;  Laterality: Left;   TOTAL HIP ARTHROPLASTY Left 08/24/2022   Procedure: OPEN REDUCTION, HEAD AND LINER EXCHANGE;  Surgeon: Samson Frederic, MD;  Location: [facility] ORS;  Service: Orthopedics;  Laterality: Left;  Social History:   reports that he has been smoking cigarettes. He has a 3.80 pack-year smoking history. He has never used smokeless tobacco. He reports that he does not currently use drugs. He reports that he does not drink alcohol.   Family History:  His family history includes Diabetes in his father and sister; Stroke in his mother and sister.   Allergies No Known Allergies   Home Medications  Prior to Admission medications   Medication Sig Start Date End Date Taking? Authorizing Provider  aspirin EC 325 MG tablet Take 325 mg by mouth daily.    [provider]  Ensure Max Protein (ENSURE MAX PROTEIN) LIQD Take 330 mLs (11 oz total) by mouth 2 (two) times daily. 08/28/22   Lorin Glass, MD  fludrocortisone (FLORINEF) 0.1 MG tablet Take 2 tablets (0.2 mg total) by mouth daily. 03/05/22   Rolly Salter, MD  glimepiride (AMARYL) 2 MG tablet Take 2 mg by mouth daily.  01/26/19   [provider]  JARDIANCE 10 MG TABS tablet Take 10 mg by mouth daily. Patient not taking: Reported on 08/23/2022 07/05/22   [provider]  midodrine (PROAMATINE) 5 MG tablet Take 0.5 tablets (2.5 mg total) by mouth 3 (three) times daily with meals. 07/25/22   Rodolph Bong, MD  nystatin (MYCOSTATIN/NYSTOP) powder Apply 1 Application topically 3 (three) times daily. In the groin 08/28/22   Dahal, Melina Schools, MD  polyethylene glycol (MIRALAX  / GLYCOLAX) 17 g packet Take 17 g by mouth daily. Patient not taking: Reported on 07/19/2022 03/05/22   Rolly Salter, MD     Critical care time: 45 minutes    JD Anselm Lis [facility] 01/15/2023, 12:55 PM  Please see [email or web address] for pager details.  From 7A-7P if no response, please call [phone]. After hours, please call ELink [phone].

## 2023-01-15 NOTE — Consult Note (Addendum)
WOC Nurse Consult Note: Reason for Consult: Consult requested for multiple wounds.  Pt is critically ill in ICU with multiple systemic factors which can impair healing. Left anterior foot with dark purple Deep tissue pressure injury; 4X2cm Left inner foot with dark purple Deep tissue pressure injury; 1X1cm Left posterior heel with healing Stage 2 pressure injury, dry and scabbed, .5X.5X.1cm Left anterior 2nd toe with dry scab; 1X1cm Left posterior calf with full thickness wound, 50% red, 50% brown; 6X4X.2cm Right posterior leg full thickness wound; 3.5X2X.2cm, red and moist Right anterior 2nd toe with dry scab; .5X.5cm Right great toe, red with edema and full thickness wound; 2.5X2.5X.1cm Right posterior heel with healing Stage 2 pressure injury; dry and scabbed, 2.5X1.5X.1cm Right buttock with Stage 2 pressure injury; red and moist 8X5X.1cm Pressure Injury POA: Yes Dressing procedure/placement/frequency: Pt is on a low airloss mattress to reduce pressure. Topical treatment orders provided for bedside nurses to perform as follows to assist with removal of nonviable tissue:  1. Apply Medihoney to left posterior leg, right great toe Q day, then cover with foam dressing.  Change foam dressing Q 2 days or PRN soiling 2. Foam dressing to right posterior leg and right buttock and bilat heels. Change Q 3 days or PRN 3. Dry scabs to toes can remain open to air Please re-consult if further assistance is needed.  Thank-you,  Cammie Mcgee MSN, RN, CWOCN, [place], CNS [phone]

## 2023-01-15 NOTE — ED Provider Notes (Signed)
[place] EMERGENCY DEPARTMENT AT [facility] Provider Note   CSN: [id number] Arrival date & time: 01/15/23  1102     History  No chief complaint on file.   Micheal Moore. is a 72 y.o. 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   Patient states he woke up this morning "feeling like shit."  He states he feels dizzy but has no pain.  He states that he went to the doctor this morning where his blood sugar was noted to be greater than 500 and so was sent to the ED for concern for DKA.  He has had some nausea but no vomiting.  Denies any shortness of breath, chest pain, headache, abdominal pain.  Has never had any heart attack or cardiac history that he is aware of.  Does not take any blood thinners.  Took an aspirin this morning. States he doesn't check his glucose at home.  On arrival to the emergency department patient is noted to be hypotensive into the 80s systolic.  His EKG demonstrates an inferior STEMI.  Further history limited by acuity of situation.  HPI     Home Medications Prior to Admission medications   Medication Sig Start Date End Date Taking? Authorizing Provider  aspirin EC 325 MG tablet Take 325 mg by mouth daily.    [provider]  Ensure Max Protein (ENSURE MAX PROTEIN) LIQD Take 330 mLs (11 oz total) by mouth 2 (two) times daily. 08/28/22   Lorin Glass, MD  fludrocortisone (FLORINEF) 0.1 MG tablet Take 2 tablets (0.2 mg total) by mouth daily. 03/05/22   Rolly Salter, MD  glimepiride (AMARYL) 2 MG tablet Take 2 mg by mouth daily.  01/26/19   [provider]  JARDIANCE 10 MG TABS tablet Take 10 mg by mouth daily. Patient not taking: Reported on 08/23/2022 07/05/22   [provider]  midodrine (PROAMATINE) 5 MG tablet Take 0.5 tablets (2.5 mg total) by mouth 3  (three) times daily with meals. 07/25/22   Rodolph Bong, MD  nystatin (MYCOSTATIN/NYSTOP) powder Apply 1 Application topically 3 (three) times daily. In the groin 08/28/22   Dahal, Melina Schools, MD  polyethylene glycol (MIRALAX / GLYCOLAX) 17 g packet Take 17 g by mouth daily. Patient not taking: Reported on 07/19/2022 03/05/22   Rolly Salter, MD      Allergies    Patient has no known allergies.    Review of Systems   Review of Systems Review of systems Negative for f/c, cough.  A 10 point review of systems was performed and is negative unless otherwise reported in HPI.  Physical Exam Updated Vital Signs BP 101/67 (BP Location: Left Arm)   Pulse 90   Temp (!) 97.3 F (36.3 C) (Oral)   Resp 18   SpO2 95%  Physical Exam General: Acutely ill-appearing elderly male, lying in bed.  HEENT: PERRLA, Sclera anicteric, MMM, trachea midline.  Cardiology: RRR, no murmurs/rubs/gallops. BL radial and DP pulses equal bilaterally.  Resp: Normal respiratory rate and effort. CTAB, no wheezes, rhonchi, crackles.  Abd: Soft, non-tender, non-distended. No rebound tenderness or guarding.  GU: Deferred. MSK: No peripheral edema or signs of trauma. Extremities without deformity or TTP. No cyanosis or clubbing. Skin: pale, warm, dry.  Neuro: A&Ox4, CNs II-XII grossly intact. MAEs. Sensation grossly intact.  Psych: Normal mood and affect.  ED Results / Procedures / Treatments   Labs (all labs ordered are listed, but only abnormal results are displayed) Labs Reviewed  CBG MONITORING, ED - Abnormal; Notable for the following components:      Result Value   Glucose-Capillary >600 (*)    All other components within normal limits  CBC WITH DIFFERENTIAL/PLATELET  BETA-HYDROXYBUTYRIC ACID  URINALYSIS, ROUTINE W REFLEX MICROSCOPIC  COMPREHENSIVE METABOLIC PANEL  PROTIME-INR  APTT    EKG EKG Interpretation  Date/Time:  Tuesday January 15 2023 11:16:52 EDT Ventricular Rate:  85 PR  Interval:  139 QRS Duration: 82 QT Interval:  356 QTC Calculation: 424 R Axis:   72 Text Interpretation: Sinus rhythm  Inferoposterior infarct, acute (RCA)  Lateral leads are also involved  Baseline wander in lead(s) V2, II, III  >>> Acute MI <<<    Confirmed by Vivi Barrack [phone]) on 01/15/2023 11:47:30 AM    Radiology No results found.  Procedures .Critical Care  Performed by: Loetta Rough, MD Authorized by: Loetta Rough, MD   Critical care provider statement:    Critical care time (minutes):  30   Critical care was necessary to treat or prevent imminent or life-threatening deterioration of the following conditions:  Cardiac failure and shock   Critical care was time spent personally by me on the following activities:  Development of treatment plan with patient or surrogate, discussions with consultants, evaluation of patient's response to treatment, examination of patient, ordering and review of laboratory studies, ordering and review of radiographic studies, ordering and performing treatments and interventions, pulse oximetry, re-evaluation of patient's condition, review of old charts and obtaining history from patient or surrogate   Care discussed with: admitting provider       Medications Ordered in ED Medications  aspirin chewable tablet 243 mg ( Oral MAR Hold 01/15/23 1143)  ondansetron (ZOFRAN) 4 MG/2ML injection (has no administration in time range)  heparin 5000 UNIT/ML injection (has no administration in time range)  heparin injection 4,000 Units ( Intravenous Automatically Held 01/15/23 1145)  ondansetron (ZOFRAN) injection 4 mg ( Intravenous Automatically Held 01/15/23 1145)    ED Course/ Medical Decision Making/ A&P                          Medical Decision Making   This patient presents to the ED for concern of dizziness, hypotension; this involves an extensive number of treatment options, and is a complaint that carries with it a high risk of complications  and morbidity.  I considered the following differential and admission for this acute, potentially life threatening condition.   MDM:    Patient sent from encounter for general exam found to have high blood sugar c/f DKA. Pt complains of dizziness and is found to be hypotensive into 70s-80s systolic which improves almost immediately after starting LR.  Patient is pale and unwell appearing.  EKG demonstrates elevations in inferior leads with reciprocal depressions concerning for an acute inferior STEMI.  Concerning for cardiogenic shock of course but also hypovolemic shock given he did respond to fluid.  He is afebrile and not tachycardic, lower concern for septic shock, he does not have any chest pain or shortness of breath, lower concern as well for obstructive shock. Patient states he did take an aspirin this morning and will give 243 mg here now.'s code STEMI immediately paged and cardiology will take patient to the Cath Lab.  Patient's POC glucose also comes back greater than  600 but patient will go to Cath Lab and have remainder of workup for hyperglycemia as an inpatient.  Patient's blood pressure 101/67 and patient is headed to the Cath Lab emergently, giving heparin IV.  Clinical Course as of 01/15/23 1147  Tue Jan 15, 2023  1132 Code STEMi called immediately for inferior STEMI on EKG. Cardiology now at bedside. Going to cath lab.  [HN]    Clinical Course User Index [HN] Loetta Rough, MD    Labs: I Ordered, and personally interpreted labs.  The pertinent results include:  glucose >600 mg/dL.  Remainder of labs still pending.  Additional history obtained from chart review.  External records obtained and [facility] from appointment today.  Cardiac Monitoring: The patient was maintained on a cardiac monitor.  I personally viewed and interpreted the cardiac monitored which showed an underlying rhythm of: NSR  Social Determinants of Health: Patient lives  independently   Disposition:  To cath lab immediately   Co morbidities that complicate the patient evaluation  Past Medical History:  Diagnosis Date   Cancer (HCC)    right elbow melanoma   Diabetes mellitus without complication (HCC)    Type II   Dizziness    DVT (deep venous thrombosis) (HCC)    right leg   Frequent falls    Near syncope    Syncope      Medicines Meds ordered this encounter  Medications   DISCONTD: aspirin EC tablet 243 mg   DISCONTD: aspirin 325 MG tablet    Dugarte Mena Pauls: cabinet override   aspirin chewable tablet 243 mg   ondansetron (ZOFRAN) 4 MG/2ML injection    Gerlene Burdock, Baldo Ash: cabinet override   heparin 5000 UNIT/ML injection    von Dohlen, Haley B: cabinet override   heparin injection 4,000 Units   ondansetron (ZOFRAN) injection 4 mg    I have reviewed the patients home medicines and have made adjustments as needed  Problem List / ED Course: Problem List Items Addressed This Visit   None Visit Diagnoses     ST elevation myocardial infarction (STEMI) involving other coronary artery of inferior wall (HCC)    -  Primary   Relevant Medications   aspirin chewable tablet 243 mg   heparin 5000 UNIT/ML injection   heparin injection 4,000 Units   Hyperglycemia                       This note was created using dictation software, which may contain spelling or grammatical errors.    Loetta Rough, MD 01/15/23 1154

## 2023-01-15 NOTE — Telephone Encounter (Signed)
Pharmacy Patient Advocate Encounter  Insurance verification completed.    The patient is insured through [employer] Medicare Part D   The patient is currently admitted and ran test claims for the following: Brilinta .  Copays and coinsurance results were relayed to Inpatient clinical team.  

## 2023-01-15 NOTE — Inpatient Diabetes Management (Addendum)
Inpatient Diabetes Program Recommendations  AACE/ADA: New Consensus Statement on Inpatient Glycemic Control (2015)  Target Ranges:  Prepandial:   less than 140 mg/dL      Peak postprandial:   less than 180 mg/dL (1-2 hours)      Critically ill patients:  140 - 180 mg/dL   Lab Results  Component Value Date   GLUCAP >600 (HH) 01/15/2023   HGBA1C 9.3 (H) 08/23/2022    Review of Glycemic Control  Latest Reference Range & Units 01/15/23 11:11  Sodium 135 - 145 mmol/L 130 (L)  Potassium 3.5 - 5.1 mmol/L 3.9  Chloride 98 - 111 mmol/L 96 (L)  CO2 22 - 32 mmol/L 15 (L)  Glucose 70 - 99 mg/dL 161 (HH)  BUN 8 - 23 mg/dL 14  Creatinine 0.96 - 0.45 mg/dL 4.09  Calcium 8.9 - 81.1 mg/dL 9.6  Anion gap 5 - 15  19 (H)   Diabetes history: DM 2 Outpatient Diabetes medications: Amaryl 2 mg Daily Current orders for Inpatient glycemic control:  None ordered currently  Last A1c 08/23/2022 at 9.3%  Inpatient Diabetes Program Recommendations:    -  Consider IV insulin until acidosis is cleared.  Will see pt this admission regarding glucose control and resources outpatient.  Thanks,  Christena Deem RN, MSN, BC-ADM Inpatient Diabetes Coordinator Team Pager [phone] (8a-5p)

## 2023-01-16 ENCOUNTER — Encounter (HOSPITAL_COMMUNITY): Payer: Self-pay | Admitting: Internal Medicine

## 2023-01-16 ENCOUNTER — Inpatient Hospital Stay (HOSPITAL_COMMUNITY): Payer: Medicare HMO

## 2023-01-16 DIAGNOSIS — S81802A Unspecified open wound, left lower leg, initial encounter: Secondary | ICD-10-CM

## 2023-01-16 DIAGNOSIS — I5032 Chronic diastolic (congestive) heart failure: Secondary | ICD-10-CM

## 2023-01-16 DIAGNOSIS — I2111 ST elevation (STEMI) myocardial infarction involving right coronary artery: Secondary | ICD-10-CM

## 2023-01-16 DIAGNOSIS — R739 Hyperglycemia, unspecified: Secondary | ICD-10-CM

## 2023-01-16 LAB — GLUCOSE, CAPILLARY
Glucose-Capillary: 119 mg/dL — ABNORMAL HIGH (ref 70–99)
Glucose-Capillary: 144 mg/dL — ABNORMAL HIGH (ref 70–99)
Glucose-Capillary: 145 mg/dL — ABNORMAL HIGH (ref 70–99)
Glucose-Capillary: 154 mg/dL — ABNORMAL HIGH (ref 70–99)
Glucose-Capillary: 158 mg/dL — ABNORMAL HIGH (ref 70–99)
Glucose-Capillary: 163 mg/dL — ABNORMAL HIGH (ref 70–99)
Glucose-Capillary: 166 mg/dL — ABNORMAL HIGH (ref 70–99)
Glucose-Capillary: 173 mg/dL — ABNORMAL HIGH (ref 70–99)
Glucose-Capillary: 182 mg/dL — ABNORMAL HIGH (ref 70–99)
Glucose-Capillary: 187 mg/dL — ABNORMAL HIGH (ref 70–99)
Glucose-Capillary: 221 mg/dL — ABNORMAL HIGH (ref 70–99)

## 2023-01-16 LAB — CBC
HCT: 34.2 % — ABNORMAL LOW (ref 39.0–52.0)
Hemoglobin: 11.1 g/dL — ABNORMAL LOW (ref 13.0–17.0)
MCH: 30 pg (ref 26.0–34.0)
MCHC: 32.5 g/dL (ref 30.0–36.0)
MCV: 92.4 fL (ref 80.0–100.0)
Platelets: 223 10*3/uL (ref 150–400)
RBC: 3.7 MIL/uL — ABNORMAL LOW (ref 4.22–5.81)
RDW: 13.6 % (ref 11.5–15.5)
WBC: 9.6 10*3/uL (ref 4.0–10.5)
nRBC: 0 % (ref 0.0–0.2)

## 2023-01-16 LAB — ECHOCARDIOGRAM COMPLETE
Area-P 1/2: 3.53 cm2
Calc EF: 59.3 %
Height: 77 in
S' Lateral: 3.5 cm
Single Plane A2C EF: 61.8 %
Single Plane A4C EF: 52.3 %
Weight: 2970.04 oz

## 2023-01-16 LAB — LIPID PANEL
Cholesterol: 146 mg/dL (ref 0–200)
HDL: 38 mg/dL — ABNORMAL LOW (ref >40)
LDL Cholesterol: 94 mg/dL (ref 0–99)
Total CHOL/HDL Ratio: 3.8 RATIO
Triglycerides: 68 mg/dL (ref <150)
VLDL: 14 mg/dL (ref 0–40)

## 2023-01-16 LAB — BETA-HYDROXYBUTYRIC ACID: Beta-Hydroxybutyric Acid: 0.06 mmol/L (ref 0.05–0.27)

## 2023-01-16 LAB — MAGNESIUM: Magnesium: 1.7 mg/dL (ref 1.7–2.4)

## 2023-01-16 LAB — BASIC METABOLIC PANEL
Anion gap: 12 (ref 5–15)
BUN: 12 mg/dL (ref 8–23)
CO2: 24 mmol/L (ref 22–32)
Calcium: 9.2 mg/dL (ref 8.9–10.3)
Chloride: 101 mmol/L (ref 98–111)
Creatinine, Ser: 0.64 mg/dL (ref 0.61–1.24)
GFR, Estimated: >60 mL/min (ref >60)
Glucose, Bld: 176 mg/dL — ABNORMAL HIGH (ref 70–99)
Potassium: 3.4 mmol/L — ABNORMAL LOW (ref 3.5–5.1)
Sodium: 137 mmol/L (ref 135–145)

## 2023-01-16 LAB — VAS US ABI WITH/WO TBI
Left ABI: 1.28
Right ABI: 1.38

## 2023-01-16 MED ORDER — POTASSIUM CHLORIDE 20 MEQ PO PACK
40.0000 meq | PACK | Freq: Once | ORAL | Status: AC
  Administered 2023-01-16: 40 meq via ORAL
  Filled 2023-01-16: qty 2

## 2023-01-16 MED ORDER — MAGNESIUM SULFATE 2 GM/50ML IV SOLN
2.0000 g | Freq: Once | INTRAVENOUS | Status: AC
  Administered 2023-01-16: 2 g via INTRAVENOUS
  Filled 2023-01-16: qty 50

## 2023-01-16 MED ORDER — POTASSIUM CHLORIDE 20 MEQ PO PACK
20.0000 meq | PACK | Freq: Once | ORAL | Status: AC
  Administered 2023-01-16: 20 meq via ORAL
  Filled 2023-01-16: qty 1

## 2023-01-16 MED ORDER — INSULIN DETEMIR 100 UNIT/ML ~~LOC~~ SOLN
12.0000 [IU] | Freq: Two times a day (BID) | SUBCUTANEOUS | Status: DC
  Administered 2023-01-16 – 2023-01-17 (×3): 12 [IU] via SUBCUTANEOUS
  Filled 2023-01-16 (×5): qty 0.12

## 2023-01-16 MED ORDER — INSULIN ASPART 100 UNIT/ML IJ SOLN
2.0000 [IU] | INTRAMUSCULAR | Status: DC
  Administered 2023-01-16: 6 [IU] via SUBCUTANEOUS
  Administered 2023-01-16: 2 [IU] via SUBCUTANEOUS
  Administered 2023-01-17: 4 [IU] via SUBCUTANEOUS
  Administered 2023-01-17: 2 [IU] via SUBCUTANEOUS
  Administered 2023-01-17: 6 [IU] via SUBCUTANEOUS

## 2023-01-16 MED ORDER — PERFLUTREN LIPID MICROSPHERE
1.0000 mL | INTRAVENOUS | Status: AC | PRN
  Administered 2023-01-16: 2 mL via INTRAVENOUS

## 2023-01-16 NOTE — Progress Notes (Signed)
Rounding Note    Patient Name: Micheal Moore. Date of Encounter: 01/16/2023  [facility] Cardiologist: Dietrich Pates, MD   Subjective   S/p PCI of prox RCA yesterday due to inferior STEMI  Denies chest pain or nausea.    Blood sugars improved on insulin gtt  Inpatient Medications    Scheduled Meds:  aspirin  81 mg Oral Daily   atorvastatin  80 mg Oral Daily   Chlorhexidine Gluconate Cloth  6 each Topical Q0600   enoxaparin (LOVENOX) injection  40 mg Subcutaneous Q24H   heparin  4,000 Units Intravenous Once   leptospermum manuka honey  1 Application Topical Daily   midodrine  2.5 mg Oral TID WC   mupirocin ointment  1 Application Nasal BID   ondansetron (ZOFRAN) IV  4 mg Intravenous Once   sodium chloride flush  3 mL Intravenous Q12H   ticagrelor  90 mg Oral BID   Continuous Infusions:  sodium chloride     dextrose 5% lactated ringers 125 mL/hr at 01/16/23 0600   insulin 2.2 Units/hr (01/16/23 0600)   lactated ringers 125 mL/hr at 01/16/23 0600   PRN Meds: sodium chloride, acetaminophen, dextrose, docusate sodium, Gerhardt's butt cream, mouth rinse, polyethylene glycol, sodium chloride flush   Vital Signs    Vitals:   01/16/23 0400 01/16/23 0440 01/16/23 0500 01/16/23 0600  BP: (!) 94/51  (!) 89/52 129/71  Pulse: 80  72 86  Resp: 12  13 13   Temp: 97.9 F (36.6 C)     TempSrc: Oral     SpO2: 94%  99% 99%  Weight:  84.2 kg    Height:        Intake/Output Summary (Last 24 hours) at 01/16/2023 0828 Last data filed at 01/16/2023 0600 Gross per 24 hour  Intake 4900.27 ml  Output 850 ml  Net 4050.27 ml      01/16/2023    4:40 AM 01/15/2023   11:10 AM 10/01/2022    1:51 PM  Last 3 Weights  Weight (lbs) 185 lb 10 oz 191 lb 12.8 oz 190 lb  Weight (kg) 84.2 kg 87 kg 86.183 kg      Telemetry    SR - Personally Reviewed  ECG    SR with inferior TWI  - Personally Reviewed  Physical Exam   GEN: No acute distress.   Neck: No JVD Cardiac:  RRR, no murmurs, rubs, or gallops.  Respiratory: Clear to auscultation bilaterally. GI: Soft, nontender, non-distended  MS: No edema; No deformity.  R radial site intact Neuro:  Nonfocal  Psych: Normal affect   Labs    High Sensitivity Troponin:   Recent Labs  Lab 01/15/23 1601 01/15/23 1754  TROPONINIHS 2,670* 6,766*     Chemistry Recent Labs  Lab 01/15/23 1111 01/15/23 1214 01/15/23 1754 01/15/23 2044 01/16/23 0557  NA 130*   < > 136 136 137  K 3.9   < > 3.9 4.4 3.4*  CL 96*   < > 100 101 101  CO2 15*   < > 21* 19* 24  GLUCOSE 622*   < > 261* 162* 176*  BUN 14   < > 13 13 12   CREATININE 1.23   < > 0.88 0.75 0.64  CALCIUM 9.6   < > 9.6 9.6 9.2  MG  --   --   --   --  1.7  PROT 6.7  --   --   --   --   ALBUMIN  3.2*  --   --   --   --   AST 20  --   --   --   --   ALT 11  --   --   --   --   ALKPHOS 103  --   --   --   --   BILITOT 1.1  --   --   --   --   GFRNONAA >60   < > >60 >60 >60  ANIONGAP 19*   < > 15 16* 12   < > = values in this interval not displayed.    Lipids  Recent Labs  Lab 01/16/23 0557  CHOL 146  TRIG 68  HDL 38*  LDLCALC 94  CHOLHDL 3.8    Hematology Recent Labs  Lab 01/15/23 1111 01/15/23 1214 01/16/23 0557  WBC 15.1*  --  9.6  RBC 4.32  --  3.70*  HGB 13.2 12.2* 11.1*  HCT 40.6 36.0* 34.2*  MCV 94.0  --  92.4  MCH 30.6  --  30.0  MCHC 32.5  --  32.5  RDW 13.3  --  13.6  PLT 279  --  223   Thyroid No results for input(s): "TSH", "FREET4" in the last 168 hours.  BNPNo results for input(s): "BNP", "PROBNP" in the last 168 hours.  DDimer No results for input(s): "DDIMER" in the last 168 hours.   Radiology    CARDIAC CATHETERIZATION  Result Date: 01/15/2023   Prox RCA lesion is 80% stenosed.   Prox LAD lesion is 40% stenosed.   A stent was successfully placed.   Post intervention, there is a 0% residual stenosis. 1.  Ruptured thrombotic plaque of proximal right coronary artery treated with 1 drug-eluting stent with mild  disease elsewhere. 2.  LVEDP of 2 mmHg; the patient received a 250 cc normal saline bolus. Recommendation: Dual antiplatelet therapy for at least 1 year, echocardiogram, and ICU management of hyperglycemia.  The results were reviewed with the patient's spouse and Dr. Michae Kava.    Cardiac Studies   4/30 PCI 1.  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 2.  LVEDP of 2 mmHg; the patient received a 250 cc normal saline bolus.  Patient Profile     72 y.o. male with a history of syncope felt to be secondary to orthostasis on Midodrine, type 2 diabetes mellitus, s/p left great toe amputation in 04/2020 in setting of osteomyelitis, remote DVT no longer on anticoagulation, and prior tobacco abuse who is being seen 01/15/2023 for the evaluation of STEMI.   Assessment & Plan     Inferior STEMI:  Cont DAPT x 1 year.  Cont atorvastatin 80mg .  F/U TTE to inform therapy but likely limited given need for midodrine at home  HL:  High dose atorvastatin  DKA/T2DM:  Per critical care who will now be primary team; appreciate recs.  Not a candidate for SGLTi given HgbA1C ~ 13.  Cont atorvastatin and ASA for now. PVD:  ABIs pending; appreciate wound care recs      For questions or updates, please contact [facility] Please consult [email or web address] for contact info under        Signed, Orbie Pyo, MD  01/16/2023, 8:28 AM

## 2023-01-16 NOTE — Progress Notes (Addendum)
NAME:  Micheal Moore., MRN:  [id number], DOB:  [birth year], LOS: 1 ADMISSION DATE:  01/15/2023, CONSULTATION DATE:  4/30 REFERRING MD:  Dr. Lynnette Caffey, CHIEF COMPLAINT:  inferior STEMI   History of Present Illness:  Patient is a 72 year old male with pertinent PMH DMT2, Chronic HFpEF, syncope secondary to orthostasis, previous DVT no longer on [facility], tobacco abuse quit 8 months ago presents to [facility] ED on 4/30 for STEMI.  Patient states on 4/30 in a.m. felt nauseous.  Went to PCP and CBG found to be 500s.  Patient sent to [facility] ED for further eval.  On arrival patient EKG showing ST elevations in inferior leads.  Patient also complaining of some dizziness on arrival patient hypotensive).  Patient denies any chest pain or SOB.  Patient was given heparin, aspirin, Zofran.  Cards consulted and patient transferred to Cath Lab.  Cath showing Prox RCA 80% stenosed and Prox LAD 40% stenosed. Stent placed and patient taken to [facility] ICU.  Concern for possible DKA.  PCCM consulted for medical management.  Pertinent  Medical History   Past Medical History:  Diagnosis Date   Cancer (HCC)    right elbow melanoma   Diabetes mellitus without complication (HCC)    Type II   Dizziness    DVT (deep venous thrombosis) (HCC)    right leg   Frequent falls    Near syncope    Syncope      Significant Hospital Events: Including procedures, antibiotic start and stop dates in addition to other pertinent events   4/30 inferior STEMI; DKA  Interim History / Subjective:  No events overnight Anion gap closed K 3.4 and mag 1.7  Objective   Blood pressure 129/71, pulse 86, temperature 97.9 F (36.6 C), temperature source Oral, resp. rate 13, height 6\' 5"  (1.956 m), weight 84.2 kg, SpO2 99 %.        Intake/Output Summary (Last 24 hours) at 01/16/2023 0721 Last data filed at 01/16/2023 0600 Gross per 24 hour  Intake 4900.27 ml  Output 850 ml  Net 4050.27 ml   Filed Weights   01/15/23 1110 01/16/23 0440   Weight: 87 kg 84.2 kg    Examination: General:  NAD HEENT: MM pink/moist; poor dentition Neuro: Aox3; MAE CV: s1s2, rate 100s, no m/r/g PULM:  dim clear bs b/l; on room air GI: soft, bsx4 active  Extremities: warm/dry, no edema  Skin: no rashes or lesions    Resolved Hospital Problem list     Assessment & Plan:  Inferior STEMI Chronic HFpEF -Cath showing Prox RCA 80% stenosed and Prox LAD 40% stenosed. Stent placed P: -Cards following; appreciate recs -Echo today -DAPT and statin per cards -Hold BB for now w/ hx of orthostatic hypotension  DKA DMT2 -A1c on 4/30 13.3 P: -gap closed -transition to ssi and basal insulin -CBG monitoring  Hypokalemia Hypomagnesemia P: -replete K and mag -trend and replete electrolytes as needed  Leukocytosis -Likely reactive P: -Trend WBC/fever curve  Hyponatremia -Likely hypovolemia P: -improved w/ iv fluids -Trend BMP  Orthostatic hypotension P: -Resume home midodrine -hold florinef in setting of dka  PVD LE wound Decubitus ulcer P: -WOC consult -vascular US today given poor circulation and venous stasis changes and ulcerations  Deconditioning P: -PT/OT   Best Practice (right click and "Reselect all SmartList Selections" daily)   Diet/type: Regular consistency (see orders) DVT prophylaxis: LMWH GI prophylaxis: N/A Lines: N/A Foley:  N/A Code Status:  full code Last date of multidisciplinary goals  of care discussion (5/1 updated patient at bedside  Labs   CBC: Recent Labs  Lab 01/15/23 1111 01/15/23 1214 01/16/23 0557  WBC 15.1*  --  9.6  NEUTROABS 11.6*  --   --   HGB 13.2 12.2* 11.1*  HCT 40.6 36.0* 34.2*  MCV 94.0  --  92.4  PLT 279  --  223     Basic Metabolic Panel: Recent Labs  Lab 01/15/23 1111 01/15/23 1214 01/15/23 1601 01/15/23 1754 01/15/23 2044 01/16/23 0557  NA 130* 115* 136 136 136 137  K 3.9 3.1* 3.8 3.9 4.4 3.4*  CL 96* 81* 99 100 101 101  CO2 15*  --  21* 21* 19*  24  GLUCOSE 622* 438* 390* 261* 162* 176*  BUN 14 13 13 13 13 12   CREATININE 1.23 0.40* 0.90 0.88 0.75 0.64  CALCIUM 9.6  --  9.7 9.6 9.6 9.2  MG  --   --   --   --   --  1.7    GFR: Estimated Creatinine Clearance: 99.4 mL/min (by C-G formula based on SCr of 0.64 mg/dL). Recent Labs  Lab 01/15/23 1111 01/16/23 0557  WBC 15.1* 9.6     Liver Function Tests: Recent Labs  Lab 01/15/23 1111  AST 20  ALT 11  ALKPHOS 103  BILITOT 1.1  PROT 6.7  ALBUMIN 3.2*    No results for input(s): "LIPASE", "AMYLASE" in the last 168 hours. No results for input(s): "AMMONIA" in the last 168 hours.  ABG    Component Value Date/Time   TCO2 20 (L) 01/15/2023 1214     Coagulation Profile: Recent Labs  Lab 01/15/23 1133  INR 1.1     Cardiac Enzymes: No results for input(s): "CKTOTAL", "CKMB", "CKMBINDEX", "TROPONINI" in the last 168 hours.  HbA1C: Hgb A1c MFr Bld  Date/Time Value Ref Range Status  01/15/2023 04:01 PM 13.3 (H) 4.8 - 5.6 % Final    Comment:    (NOTE) Pre diabetes:          5.7%-6.4%  Diabetes:              >6.4%  Glycemic control for   <7.0% adults with diabetes   08/23/2022 07:25 PM 9.3 (H) 4.8 - 5.6 % Final    Comment:    (NOTE)         Prediabetes: 5.7 - 6.4         Diabetes: >6.4         Glycemic control for adults with diabetes: <7.0     CBG: Recent Labs  Lab 01/16/23 0304 01/16/23 0407 01/16/23 0515 01/16/23 0558 01/16/23 0704  GLUCAP 145* 182* 154* 163* 166*      Past Medical History:  He,  has a past medical history of Cancer (HCC), Diabetes mellitus without complication (HCC), Dizziness, DVT (deep venous thrombosis) (HCC), Frequent falls, Near syncope, and Syncope.   Surgical History:   Past Surgical History:  Procedure Laterality Date   AMPUTATION TOE Left 04/28/2020   Procedure: AMPUTATION LEFT GREAT TOE;  Surgeon: Nadara Mustard, MD;  Location: [facility] OR;  Service: Orthopedics;  Laterality: Left;   CORONARY/GRAFT ACUTE MI  REVASCULARIZATION N/A 01/15/2023   Procedure: Coronary/Graft Acute MI Revascularization;  Surgeon: Orbie Pyo, MD;  Location: [facility] INVASIVE CV LAB;  Service: Cardiovascular;  Laterality: N/A;   INTRAMEDULLARY (IM) NAIL INTERTROCHANTERIC Right 02/23/2022   Procedure: INTRAMEDULLARY (IM) NAIL INTERTROCHANTRIC, RIGHT;  Surgeon: Samson Frederic, MD;  Location: [facility] ORS;  Service: Orthopedics;  Laterality: Right;   LEFT HEART CATH AND CORONARY ANGIOGRAPHY N/A 01/15/2023   Procedure: LEFT HEART CATH AND CORONARY ANGIOGRAPHY;  Surgeon: Orbie Pyo, MD;  Location: [facility] INVASIVE CV LAB;  Service: Cardiovascular;  Laterality: N/A;   MELANOMA EXCISION WITH SENTINEL LYMPH NODE BIOPSY Right 08/19/2020   Procedure: WIDE LOCAL EXCISION RIGHT ELBOW MELANOMA WITH RIGHT AXILLARY SENTINEL LYMPH NODE BIOPSY;  Surgeon: Fritzi Mandes, MD;  Location: [facility] OR;  Service: General;  Laterality: Right;  2nd incision in axilla   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   Procedure: TOTAL HIP ARTHROPLASTY ANTERIOR APPROACH;  Surgeon: Samson Frederic, MD;  Location: [facility] ORS;  Service: Orthopedics;  Laterality: Left;   TOTAL HIP ARTHROPLASTY Left 08/24/2022   Procedure: OPEN REDUCTION, HEAD AND LINER EXCHANGE;  Surgeon: Samson Frederic, MD;  Location: [facility] ORS;  Service: Orthopedics;  Laterality: Left;     Social History:   reports that he has been smoking cigarettes. He has a 3.80 pack-year smoking history. He has never used smokeless tobacco. He reports that he does not currently use drugs. He reports that he does not drink alcohol.   Family History:  His family history includes Diabetes in his father and sister; Stroke in his mother and sister.   Allergies No Known Allergies   Home Medications  Prior to Admission medications   Medication Sig Start Date End Date Taking? Authorizing Provider  aspirin EC 325 MG tablet Take 325 mg by mouth daily.    [provider]  Ensure Max Protein (ENSURE MAX PROTEIN) LIQD Take 330  mLs (11 oz total) by mouth 2 (two) times daily. 08/28/22   Lorin Glass, MD  fludrocortisone (FLORINEF) 0.1 MG tablet Take 2 tablets (0.2 mg total) by mouth daily. 03/05/22   Rolly Salter, MD  glimepiride (AMARYL) 2 MG tablet Take 2 mg by mouth daily.  01/26/19   [provider]  JARDIANCE 10 MG TABS tablet Take 10 mg by mouth daily. Patient not taking: Reported on 08/23/2022 07/05/22   [provider]  midodrine (PROAMATINE) 5 MG tablet Take 0.5 tablets (2.5 mg total) by mouth 3 (three) times daily with meals. 07/25/22   Rodolph Bong, MD  nystatin (MYCOSTATIN/NYSTOP) powder Apply 1 Application topically 3 (three) times daily. In the groin 08/28/22   Dahal, Melina Schools, MD  polyethylene glycol (MIRALAX / GLYCOLAX) 17 g packet Take 17 g by mouth daily. Patient not taking: Reported on 07/19/2022 03/05/22   Rolly Salter, MD         JD Anselm Lis [facility] 01/16/2023, 7:21 AM  Please see [email or web address] for pager details.  From 7A-7P if no response, please call [phone]. After hours, please call ELink [phone].

## 2023-01-16 NOTE — Inpatient Diabetes Management (Addendum)
Inpatient Diabetes Program Recommendations  AACE/ADA: New Consensus Statement on Inpatient Glycemic Control (2015)  Target Ranges:  Prepandial:   less than 140 mg/dL      Peak postprandial:   less than 180 mg/dL (1-2 hours)      Critically ill patients:  140 - 180 mg/dL   Lab Results  Component Value Date   GLUCAP 144 (H) 01/16/2023   HGBA1C 13.3 (H) 01/15/2023    Review of Glycemic Control  Diabetes history: DM 2 Outpatient Diabetes medications: Amaryl 2 mg Daily Current orders for Inpatient glycemic control:  Levemir 12 units Q12 hours Novolog 2-6 units Q4 hours   Current A1c 13.3% this admission  Spoke with pt at bedside regarding A1c of 13.3% and glucose control at home. Pt reports not having a glucometer at home to check glucose levels. Pt reports taking a pill once a day. Pt asked if I was going to add another pill. I explained to pt the level in which his A1c was and that at this point in time the best medication option for him would to be on a once daily injection of basal insulin plus an oral agent. I showed pt how to operate an insulin pen. Pt needed much guidance during teach back. Pt will need much reinforcement. Pt will be seen by Diabetes Coordinator again tomorrow for reinforcement of education. Pt would like [place] to speak with his wife as well. Pt reports wife would be present during the day due to her work schedule in the evening.   Thanks,  Christena Deem RN, MSN, BC-ADM Inpatient Diabetes Coordinator Team Pager [phone] (8a-5p)

## 2023-01-16 NOTE — Progress Notes (Signed)
  Echocardiogram 2D Echocardiogram has been performed.  Janalyn Harder 01/16/2023, 11:01 AM

## 2023-01-16 NOTE — Evaluation (Signed)
Occupational Therapy Evaluation Patient Details Name: Micheal Moore. MRN: [id number] DOB: [birth year] Today's Date: 01/16/2023   History of Present Illness Pt is a 72 y.o. male admitted 01/15/23 with STEMI, diabetic ketoacidosis, PVD, multiple pressure injuries. S/p PCI with DES to proximal RCA. PMH includes DM2, HLD, DVT, multiple falls sustaining orthopedic injuries (most recent 2x admits 08/2022 for L hip dislocations s/p L THA revision 08/24/22; discharge to SNF rehab).   Clinical Impression   Pt reports at baseline he is bedbound, needs total assist for ADLs since leaving SNF, lives with spouse who provides PRN assist but reports son also assists with meals. Pt currently needing set up -total A for ADLs, and mod-max A +2 for bed mobility. Pt with strong posterior lean in sitting needing mod posterior support to remain upright, pt declining further mobility requesting to return to supine. Pt presenting with impairments listed below, will follow acutely. Patient will benefit from continued inpatient follow up therapy, <3 hours/day to maximize safety and independence with ADLs/functional mobility.      Recommendations for follow up therapy are one component of a multi-disciplinary discharge planning process, led by the attending physician.  Recommendations may be updated based on patient status, additional functional criteria and insurance authorization.   Assistance Recommended at Discharge Frequent or constant Supervision/Assistance  Patient can return home with the following Two people to help with walking and/or transfers;A lot of help with bathing/dressing/bathroom;Direct supervision/assist for medications management;Direct supervision/assist for financial management;Help with stairs or ramp for entrance;Assist for transportation;Assistance with cooking/housework    Functional Status Assessment  Patient has had a recent decline in their functional status and demonstrates the ability  to make significant improvements in function in a reasonable and predictable amount of time.  Equipment Recommendations  Tub/shower bench    Recommendations for Other Services PT consult     Precautions / Restrictions Precautions Precautions: Fall;Other (comment) Precaution Comments: reports primarily bedbound the past few months due to h/o multiple falls Restrictions Weight Bearing Restrictions: No      Mobility Bed Mobility Overal bed mobility: Needs Assistance Bed Mobility: Supine to Sit, Sit to Supine     Supine to sit: Mod assist, +2 for physical assistance Sit to supine: Max assist, +2 for physical assistance        Transfers                   General transfer comment: deferred      Balance Overall balance assessment: Needs assistance Sitting-balance support: Feet supported Sitting balance-[name]: Poor Sitting balance - Comments: posterior lean                                   ADL either performed or assessed with clinical judgement   ADL Overall ADL's : Needs assistance/impaired Eating/Feeding: Set up;Bed level   Grooming: Set up;Bed level   Upper Body Bathing: Moderate assistance;Bed level   Lower Body Bathing: Maximal assistance;Bed level   Upper Body Dressing : Moderate assistance;Bed level   Lower Body Dressing: Bed level;Total assistance   Toilet Transfer: Maximal assistance;+2 for physical assistance           Functional mobility during ADLs: Moderate assistance;Maximal assistance;+2 for physical assistance       Vision   Vision Assessment?: No apparent visual deficits     Perception Perception Perception Tested?: No   Praxis Praxis Praxis tested?: Not tested  Pertinent Vitals/Pain Pain Assessment Pain Assessment: Faces Pain Score: 5  Faces Pain Scale: Hurts even more Pain Location: BLEs and back with repositioning/mobility Pain Descriptors / Indicators: Grimacing, Guarding     Hand  Dominance Right   Extremity/Trunk Assessment Upper Extremity Assessment Upper Extremity Assessment: Generalized weakness (painful reaching arms overhead)   Lower Extremity Assessment Lower Extremity Assessment: RLE deficits/detail;LLE deficits/detail RLE Deficits / Details: BLE contractures noted; difficulty achieving full R knee flexion sitting EOB, though able to fully extend knee with fair quad activation; observed hip strength </ 3/5; noted PF contracutres; bilateral lower leg discoloration/swelling, wounds bilateral feet RLE Coordination: decreased gross motor;decreased fine motor LLE Deficits / Details: BLE contractures noted; L knee extensor contracture, difficulty tolerating any knee flexion sitting EOB; observed hip strength </ 3/5; noted PF contracutres; bilateral lower leg discoloration/swelling, wounds bilateral feet LLE Coordination: decreased gross motor;decreased fine motor       Communication Communication Communication: No difficulties   Cognition Arousal/Alertness: Awake/alert Behavior During Therapy: WFL for tasks assessed/performed Overall Cognitive Status: No family/caregiver present to determine baseline cognitive functioning                                 General Comments: tangential and needing frequent redirection at times     General Comments  educ pt re: role of acute PT, POC, activity recommendations. increased time discussing discharge plan and attempting to elicit mobility goals with pt - pt adamant he cannot stand or walk due to leg pain and weakness; discussed options for transfer and wheelchair mobility training to increase mobility and decrease caregiver burden at home, pt ultimately not stating his goals or what he would like to do with acute or follow-up PT. will follow up for an additional session to monitor progression    Exercises     Shoulder Instructions      Home Living Family/patient expects to be discharged to:: Private  residence Living Arrangements: Spouse/significant other;Children Available Help at Discharge: Family;Available PRN/intermittently Type of Home: House Home Access: Stairs to enter [employer] of Steps: 2-3 steps to porch and one into house Entrance Stairs-Rails: None Home Layout: One level     Bathroom Shower/Tub: Chief Strategy Officer: Standard     Home Equipment: Agricultural consultant (2 wheels);Wheelchair - manual;Hospital bed   Additional Comments: reports living with wife and wife's son. wife works but her son is available to assist during the day      Prior Functioning/Environment Prior Level of Function : Needs assist;Patient poor historian/Family not available             Mobility Comments: pt reports bedbound since return home from rehab after last hospital admission. pt inconsistent historian regarding how he gets OOB to w/c, stating he never gets OOB but then is pulled up/down steps in w/c by family to appts ADLs Comments: per chart, was indep with mobility/ADLs prior to fall 07/20/2022. pt reports bedbound the past few months; wife assists with bed-level bathing and dressing. if pt incontinence, he waits until wife gets home from work to be cleaned up. wife's son will cook for pt and bring him meals to bed        OT Problem List: Decreased strength;Decreased range of motion;Decreased activity tolerance;Impaired balance (sitting and/or standing);Decreased cognition      OT Treatment/Interventions: Self-care/ADL training;Therapeutic exercise;Energy conservation;DME and/or AE instruction;Therapeutic activities;Patient/family education;Balance training    OT Goals(Current goals can be  found in the care plan section) Acute Rehab OT Goals Patient Stated Goal: none stated OT Goal Formulation: With patient Time For Goal Achievement: 01/30/23 Potential to Achieve Goals: Good ADL Goals Pt Will Perform Upper Body Dressing: with min assist;sitting;bed  level Pt Will Perform Lower Body Dressing: with mod assist;sitting/lateral leans;sit to/from stand Pt Will Transfer to Toilet: regular height toilet;with mod assist;squat pivot transfer;stand pivot transfer Additional ADL Goal #1: pt will perform bed mobility min A in prep for ADLs  OT Frequency: Min 1X/week    Co-evaluation PT/OT/SLP Co-Evaluation/Treatment: Yes Reason for Co-Treatment: Necessary to address cognition/behavior during functional activity;For patient/therapist safety;To address functional/ADL transfers PT goals addressed during session: Mobility/safety with mobility;Balance OT goals addressed during session: ADL's and self-care      AM-PAC OT "6 Clicks" Daily Activity     Outcome Measure Help from another person eating meals?: A Little Help from another person taking care of personal grooming?: A Little Help from another person toileting, which includes using toliet, bedpan, or urinal?: A Lot Help from another person bathing (including washing, rinsing, drying)?: A Lot Help from another person to put on and taking off regular upper body clothing?: A Lot Help from another person to put on and taking off regular lower body clothing?: Total 6 Click Score: 13   End of Session Nurse Communication: Mobility status  Activity Tolerance: Patient tolerated treatment well Patient left: in bed;with call bell/phone within reach;with bed alarm set  OT Visit Diagnosis: Muscle weakness (generalized) (M62.81)                Time: 1610-9604 OT Time Calculation (min): 19 min Charges:  OT General Charges $OT Visit: 1 Visit OT Evaluation $OT Eval Moderate Complexity: 1 Mod  Larrisa Cravey K, OTD, OTR/L [employer] Acute Rehab [phone]   Jamilya Sarrazin K Koonce 01/16/2023, 1:42 PM

## 2023-01-16 NOTE — Progress Notes (Signed)
ABI study completed.   Please see CV Procedures for preliminary results.  Kaley Jutras, RVT  12:39 PM 01/16/23

## 2023-01-16 NOTE — Evaluation (Addendum)
Physical Therapy Evaluation Patient Details Name: Micheal Moore. MRN: [id number] DOB: 1951/06/08 Today's Date: 01/16/2023  History of Present Illness  Pt is a 72 y.o. male admitted 01/15/23 with STEMI, diabetic ketoacidosis, PVD, multiple pressure injuries. S/p PCI with DES to proximal RCA. PMH includes DM2, HLD, DVT, multiple falls sustaining orthopedic injuries (most recent 2x admits 08/2022 for L hip dislocations s/p L THA revision 08/24/22; discharge to SNF rehab).   Clinical Impression  Pt presents with an overall decrease in functional mobility secondary to above. PTA, pt reports primarily bedbound the past few months due to LE pain and frequent falls; pt lives with wife and wife's son, reliant on their assist for bed-level ADL/iADLs. Today, pt requiring modA+2 to sit EOB, posterior lean and BLE contractures limiting sitting balance. Increased time discussing discharge plan and attempting to elicit pt goals - pt states interested in SNF, but worried he'll have to go home because no means of paying for rehab or more help at home. Will trial on acute PT caseload for activity progression and to further assist with discharge recommendations.     Recommendations for follow up therapy are one component of a multi-disciplinary discharge planning process, led by the attending physician.  Recommendations may be updated based on patient status, additional functional criteria and insurance authorization.  Follow Up Recommendations Can patient physically be transported by private vehicle: No     Assistance Recommended at Discharge Frequent or constant Supervision/Assistance  Patient can return home with the following  Two people to help with walking and/or transfers;Two people to help with bathing/dressing/bathroom;Assistance with cooking/housework;Direct supervision/assist for medications management;Direct supervision/assist for financial management;Assist for transportation;Help with stairs or  ramp for entrance    Equipment Recommendations  (TBD) - declines hoyer lift  Recommendations for Other Services       Functional Status Assessment Patient has had a recent decline in their functional status and demonstrates the ability to make significant improvements in function in a reasonable and predictable amount of time.     Precautions / Restrictions Precautions Precautions: Fall;Other (comment) Precaution Comments: reports primarily bedbound the past few months due to h/o multiple falls Restrictions Weight Bearing Restrictions: No      Mobility  Bed Mobility Overal bed mobility: Needs Assistance Bed Mobility: Supine to Sit, Sit to Supine     Supine to sit: Mod assist, +2 for physical assistance Sit to supine: Max assist, +2 for physical assistance   General bed mobility comments: modA+2 for BLE management and HHA to elevate trunk; pt with posterior lean and difficulty flexing hips/knees sitting EOB; maxA+2 return to supine and scooting up    Transfers                   General transfer comment: deferred this session; likely to require maximove lift    Ambulation/Gait                  Stairs            Wheelchair Mobility    Modified Rankin (Stroke Patients Only)       Balance Overall balance assessment: Needs assistance Sitting-balance support: Feet supported Sitting balance-[name]: Poor Sitting balance - Comments: posterior lean with difficulty achieving hip/knee flexion sitting EOB requiring mod-maxA to maintain static sitting balance  Pertinent Vitals/Pain Pain Assessment Pain Assessment: Faces Faces Pain Scale: Hurts even more Pain Location: BLEs and back with repositioning/mobility Pain Descriptors / Indicators: Grimacing, Guarding Pain Intervention(s): Limited activity within patient's tolerance, Monitored during session, Repositioned    Home Living Family/patient  expects to be discharged to:: Private residence Living Arrangements: Spouse/significant other;Children Available Help at Discharge: Family;Available PRN/intermittently Type of Home: House Home Access: Stairs to enter Entrance Stairs-Rails: None Entrance Stairs-Number of Steps: 2-3 steps to porch and one into house   Home Layout: One level Home Equipment: Agricultural consultant (2 wheels);Wheelchair - manual;Hospital bed Additional Comments: reports living with wife and wife's son. wife works but her son is available to assist during the day    Prior Function Prior Level of Function : Needs assist;Patient poor historian/Family not available             Mobility Comments: pt reports bedbound since return home from rehab after last hospital admission. pt inconsistent historian regarding how he gets OOB to w/c, stating he never gets OOB but then is pulled up/down steps in w/c by family to appts ADLs Comments: per chart, was indep with mobility/ADLs prior to fall 07/20/2022. pt reports bedbound the past few months; wife assists with bed-level bathing and dressing. if pt incontinence, he waits until wife gets home from work to be cleaned up. wife's son will cook for pt and bring him meals to bed     Hand Dominance   Dominant Hand: Right    Extremity/Trunk Assessment   Upper Extremity Assessment Upper Extremity Assessment: Generalized weakness (painful reaching arms overhead)    Lower Extremity Assessment Lower Extremity Assessment: RLE deficits/detail;LLE deficits/detail RLE Deficits / Details: BLE contractures noted; difficulty achieving full R knee flexion sitting EOB, though able to fully extend knee with fair quad activation; observed hip strength </ 3/5; noted PF contracutres; bilateral lower leg discoloration/swelling, wounds bilateral feet RLE Coordination: decreased gross motor;decreased fine motor LLE Deficits / Details: BLE contractures noted; L knee extensor contracture, difficulty  tolerating any knee flexion sitting EOB; observed hip strength </ 3/5; noted PF contracutres; bilateral lower leg discoloration/swelling, wounds bilateral feet LLE Coordination: decreased gross motor;decreased fine motor       Communication   Communication: No difficulties  Cognition Arousal/Alertness: Awake/alert Behavior During Therapy: WFL for tasks assessed/performed Overall Cognitive Status: No family/caregiver present to determine baseline cognitive functioning                                 General Comments: pt frustrated at times by mobility requests, but ultimately redirectable and cooperative. tangential; pt with apparent decreased awareness, not giving consistent answers to questions regarding PLOF and goals of therapy; pt states he plans to go back home where he is bedbound, but later states he is interested in getting to w/c. repetitive throughout conversation, suspect some anxiety related to idea of standing and fear of falling        General Comments General comments (skin integrity, edema, etc.): educ pt re: role of acute PT, POC, activity recommendations. increased time discussing discharge plan and attempting to elicit mobility goals with pt - pt adamant he cannot stand or walk due to leg pain and weakness; discussed options for transfer and wheelchair mobility training to increase mobility and decrease caregiver burden at home, pt ultimately not stating his goals or what he would like to do with acute or follow-up PT. will follow up for an  additional session to monitor progression    Exercises     Assessment/Plan    PT Assessment Patient needs continued PT services  PT Problem List Decreased strength;Decreased range of motion;Decreased activity tolerance;Decreased balance;Decreased mobility;Decreased cognition;Decreased knowledge of use of DME;Cardiopulmonary status limiting activity;Pain;Decreased skin integrity       PT Treatment Interventions DME  instruction;Functional mobility training;Therapeutic activities;Therapeutic exercise;Wheelchair mobility training;Patient/family education;Balance training    PT Goals (Current goals can be found in the Care Plan section)  Acute Rehab PT Goals Patient Stated Goal: decreased pain, have more help PT Goal Formulation: With patient Time For Goal Achievement: 01/30/23 Potential to Achieve Goals: Fair    Frequency Min 1X/week     Co-evaluation PT/OT/SLP Co-Evaluation/Treatment: Yes Reason for Co-Treatment: Necessary to address cognition/behavior during functional activity;For patient/therapist safety;To address functional/ADL transfers PT goals addressed during session: Mobility/safety with mobility;Balance OT goals addressed during session: ADL's and self-care       AM-PAC PT "6 Clicks" Mobility  Outcome Measure Help needed turning from your back to your side while in a flat bed without using bedrails?: A Lot Help needed moving from lying on your back to sitting on the side of a flat bed without using bedrails?: A Lot Help needed moving to and from a bed to a chair (including a wheelchair)?: Total Help needed standing up from a chair using your arms (e.g., wheelchair or bedside chair)?: Total Help needed to walk in hospital room?: Total Help needed climbing 3-5 steps with a railing? : Total 6 Click Score: 8    End of Session   Activity Tolerance: Patient limited by fatigue;Patient limited by pain Patient left: in bed;with call bell/phone within reach;with bed alarm set Nurse Communication: Mobility status;Need for lift equipment PT Visit Diagnosis: Other abnormalities of gait and mobility (R26.89);Muscle weakness (generalized) (M62.81);History of falling (Z91.81)    Time: 1610-9604 PT Time Calculation (min) (ACUTE ONLY): 19 min   Charges:   PT Evaluation $PT Eval Moderate Complexity: 1 Mod         Ina Homes, PT, DPT Acute Rehabilitation Services  Personal: Secure  Chat Rehab Office: [phone]  Malachy Chamber 01/16/2023, 1:53 PM

## 2023-01-16 NOTE — Progress Notes (Signed)
CARDIAC REHAB PHASE I   Post MI/stent education including MI booklet, site care, restrictions, risk factors, exertion precautions, smoking cessation, antiplatelet therapy importance, heart healthy diabetic diet and CRP2 reviewed. All questions and concerns addressed. Will refer to [facility] for CRP2. Pt has preexisting mobility and medical conditions that may inhabit participation in CRP2. PT/OT consulted for assistance with mobility needs. Will continue to follow.   1100-1200   Woodroe Chen, RN BSN 01/16/2023 12:25 PM

## 2023-01-16 NOTE — TOC Initial Note (Addendum)
Transition of Care (TOC) - Initial/Assessment Note    Patient Details  Name: Micheal Moore. MRN: [id number] Date of Birth: 1950/12/08  Transition of Care [facility]) CM/SW Contact:    Delilah Shan, LCSWA Phone Number: 01/16/2023, 3:57 PM  Clinical Narrative:                  CSW received consult for possible SNF placement at time of discharge. CSW spoke with patient at beside regarding PT recommendation of SNF placement at time of discharge. Patient reports PTA he comes from home with spouse. Patient expressed understanding of PT recommendation and politely declined SNF placement at time of discharge.Patient informed CSW he would like to return home with the support of his spouse at dc. All questions answered.No further questions reported at this time. CSW will inform CM. CSW informed CM. CSW to continue to follow and assist with discharge planning needs.   Expected Discharge Plan: Home/Self Care Barriers to Discharge: Continued Medical Work up   Patient Goals and CMS Choice Patient states their goals for this hospitalization and ongoing recovery are:: wants to return home          Expected Discharge Plan and Services In-house Referral: Clinical Social Work Discharge Planning Services: CM Consult   Living arrangements for the past 2 months: Single Family Home                                      Prior Living Arrangements/Services Living arrangements for the past 2 months: Single Family Home Lives with:: Spouse Patient language and need for interpreter reviewed:: Yes Do you feel safe going back to the place where you live?: Yes      Need for Family Participation in Patient Care: Yes (Comment) Care giver support system in place?: Yes (comment)   Criminal Activity/Legal Involvement Pertinent to Current Situation/Hospitalization: No - Comment as needed  Activities of Daily Living Home Assistive Devices/Equipment: CBG Meter, Eyeglasses, Walker (specify type),  Wheelchair ADL Screening (condition at time of admission) Patient's cognitive ability adequate to safely complete daily activities?: Yes Is the patient deaf or have difficulty hearing?: No Does the patient have difficulty seeing, even when wearing glasses/contacts?: No Does the patient have difficulty concentrating, remembering, or making decisions?: No Patient able to express need for assistance with ADLs?: Yes Does the patient have difficulty dressing or bathing?: Yes Independently performs ADLs?: No Communication: Independent Dressing (OT): Needs assistance Is this a change from baseline?: Pre-admission baseline Grooming: Needs assistance Is this a change from baseline?: Pre-admission baseline Feeding: Independent Bathing: Needs assistance Is this a change from baseline?: Pre-admission baseline Toileting: Needs assistance Is this a change from baseline?: Pre-admission baseline In/Out Bed: Needs assistance Is this a change from baseline?: Pre-admission baseline Walks in Home: Needs assistance Is this a change from baseline?: Pre-admission baseline Does the patient have difficulty walking or climbing stairs?: Yes Weakness of Legs: Both Weakness of Arms/Hands: None  Permission Sought/Granted Permission sought to share information with : Case Manager, Magazine features editor, Family Supports Permission granted to share information with : Yes, Verbal Permission Granted  Share Information with NAME: Micheal Moore     Permission granted to share info w Relationship: wife  Permission granted to share info w Contact Information: [phone]  Emotional Assessment Appearance:: Appears stated age Attitude/Demeanor/Rapport: Gracious Affect (typically observed): Calm Orientation: : Oriented to Self, Oriented to Place, Oriented to  Time, Oriented  to Situation Alcohol / Substance Use: Not Applicable Psych Involvement: No (comment)  Admission diagnosis:  Hyperglycemia  [R73.9] ST elevation myocardial infarction (STEMI) involving other coronary artery of inferior wall (HCC) [I21.19] STEMI (ST elevation myocardial infarction) (HCC) [I21.3] Patient Active Problem List   Diagnosis Date Noted   Hyperglycemia 01/16/2023   STEMI (ST elevation myocardial infarction) (HCC) 01/15/2023   Diabetic ketoacidosis without coma associated with type 2 diabetes mellitus (HCC) 01/15/2023   Anterior dislocation of left hip (HCC) 08/24/2022   Closed dislocation of left hip (HCC) 08/23/2022   Normocytic anemia 08/23/2022   Malnutrition of moderate degree 07/20/2022   Fall    Orthostatic hypotension    Hip fracture (HCC) 02/22/2022   Hyperlipidemia 02/22/2022   Chronic diastolic CHF (congestive heart failure) (HCC) 02/22/2022   Left great toe amputee (HCC) 05/06/2020   Cellulitis in diabetic foot (HCC) 04/27/2020   Osteomyelitis of great toe of left foot (HCC) 04/27/2020   Type 2 diabetes mellitus with hyperlipidemia (HCC) 04/27/2020   History of DVT (deep vein thrombosis) 04/27/2020   Tobacco abuse 04/27/2020   PCP:  [name], MD Pharmacy:   [id number] - [place] ([place]), [place] - [address]  ([place]) [place] [address] Phone: [phone] Fax: [phone]     Social Determinants of Health (SDOH) Social History: SDOH Screenings   Food Insecurity: No Food Insecurity (08/23/2022)  Housing: Low Risk  (08/23/2022)  Transportation Needs: No Transportation Needs (08/23/2022)  Utilities: Not At Risk (08/23/2022)  Tobacco Use: High Risk (01/16/2023)   SDOH Interventions:     Readmission Risk Interventions    07/25/2022    2:42 PM  Readmission Risk Prevention Plan  Transportation Screening   PCP or Specialist Appt within 5-7 Days   Home Care Screening   Medication Review (RN CM)      Information is confidential and restricted. Go to Review Flowsheets to unlock data.

## 2023-01-17 ENCOUNTER — Telehealth: Payer: Self-pay

## 2023-01-17 ENCOUNTER — Telehealth (HOSPITAL_COMMUNITY): Payer: Self-pay | Admitting: Pharmacy Technician

## 2023-01-17 ENCOUNTER — Other Ambulatory Visit (HOSPITAL_COMMUNITY): Payer: Self-pay

## 2023-01-17 DIAGNOSIS — I2111 ST elevation (STEMI) myocardial infarction involving right coronary artery: Secondary | ICD-10-CM | POA: Diagnosis not present

## 2023-01-17 DIAGNOSIS — I70245 Atherosclerosis of native arteries of left leg with ulceration of other part of foot: Secondary | ICD-10-CM

## 2023-01-17 DIAGNOSIS — I70235 Atherosclerosis of native arteries of right leg with ulceration of other part of foot: Secondary | ICD-10-CM

## 2023-01-17 DIAGNOSIS — E1169 Type 2 diabetes mellitus with other specified complication: Secondary | ICD-10-CM

## 2023-01-17 LAB — BASIC METABOLIC PANEL
Anion gap: 10 (ref 5–15)
BUN: 13 mg/dL (ref 8–23)
CO2: 22 mmol/L (ref 22–32)
Calcium: 8.7 mg/dL — ABNORMAL LOW (ref 8.9–10.3)
Chloride: 97 mmol/L — ABNORMAL LOW (ref 98–111)
Creatinine, Ser: 0.64 mg/dL (ref 0.61–1.24)
GFR, Estimated: >60 mL/min (ref >60)
Glucose, Bld: 260 mg/dL — ABNORMAL HIGH (ref 70–99)
Potassium: 3.8 mmol/L (ref 3.5–5.1)
Sodium: 129 mmol/L — ABNORMAL LOW (ref 135–145)

## 2023-01-17 LAB — CBC
HCT: 34.9 % — ABNORMAL LOW (ref 39.0–52.0)
Hemoglobin: 11.7 g/dL — ABNORMAL LOW (ref 13.0–17.0)
MCH: 30.5 pg (ref 26.0–34.0)
MCHC: 33.5 g/dL (ref 30.0–36.0)
MCV: 90.9 fL (ref 80.0–100.0)
Platelets: 212 10*3/uL (ref 150–400)
RBC: 3.84 MIL/uL — ABNORMAL LOW (ref 4.22–5.81)
RDW: 13.8 % (ref 11.5–15.5)
WBC: 8.5 10*3/uL (ref 4.0–10.5)
nRBC: 0 % (ref 0.0–0.2)

## 2023-01-17 LAB — GLUCOSE, CAPILLARY
Glucose-Capillary: 195 mg/dL — ABNORMAL HIGH (ref 70–99)
Glucose-Capillary: 212 mg/dL — ABNORMAL HIGH (ref 70–99)
Glucose-Capillary: 245 mg/dL — ABNORMAL HIGH (ref 70–99)
Glucose-Capillary: 133 mg/dL — ABNORMAL HIGH (ref 70–99)
Glucose-Capillary: 166 mg/dL — ABNORMAL HIGH (ref 70–99)
Glucose-Capillary: 287 mg/dL — ABNORMAL HIGH (ref 70–99)
Glucose-Capillary: 344 mg/dL — ABNORMAL HIGH (ref 70–99)

## 2023-01-17 LAB — MAGNESIUM: Magnesium: 1.9 mg/dL (ref 1.7–2.4)

## 2023-01-17 MED ORDER — INSULIN ASPART 100 UNIT/ML IJ SOLN
0.0000 [IU] | Freq: Three times a day (TID) | INTRAMUSCULAR | Status: DC
  Administered 2023-01-17: 5 [IU] via SUBCUTANEOUS
  Administered 2023-01-17 – 2023-01-18 (×3): 2 [IU] via SUBCUTANEOUS
  Administered 2023-01-18: 9 [IU] via SUBCUTANEOUS
  Administered 2023-01-19: 5 [IU] via SUBCUTANEOUS
  Administered 2023-01-19 (×2): 3 [IU] via SUBCUTANEOUS
  Administered 2023-01-20: 5 [IU] via SUBCUTANEOUS
  Administered 2023-01-20: 3 [IU] via SUBCUTANEOUS

## 2023-01-17 MED ORDER — FLUDROCORTISONE ACETATE 0.1 MG PO TABS: 0.2000 mg | ORAL_TABLET | Freq: Every day | ORAL | Status: DC

## 2023-01-17 MED ORDER — INSULIN ASPART 100 UNIT/ML IJ SOLN
0.0000 [IU] | Freq: Every day | INTRAMUSCULAR | Status: DC
  Administered 2023-01-17: 4 [IU] via SUBCUTANEOUS
  Administered 2023-01-18: 3 [IU] via SUBCUTANEOUS
  Administered 2023-01-19: 2 [IU] via SUBCUTANEOUS

## 2023-01-17 MED ORDER — INSULIN GLARGINE-YFGN 100 UNIT/ML ~~LOC~~ SOLN
25.0000 [IU] | Freq: Every day | SUBCUTANEOUS | Status: DC
  Administered 2023-01-17: 25 [IU] via SUBCUTANEOUS
  Filled 2023-01-17 (×2): qty 0.25

## 2023-01-17 NOTE — Hospital Course (Addendum)
72 year old male with pertinent PMH DMT2, Chronic HFpEF, syncope secondary to orthostasis, previous DVT no longer on [facility], tobacco abuse quit 8 months ago presents to [facility] ED on 4/30 for STEMI. He went to PCP and CBG found to be 500s so was sent to [facility] ED for further eval.  On arrival patient EKG showing ST elevations in inferior leads.  Patient also complaining of some dizziness on arrival patient hypotensive).  Patient denied any chest pain or SOB.  Patient was given heparin, aspirin, Zofran.  Cardiology consulted and patient transferred to Cath Lab.  Cath showing Prox RCA 80% stenosed and Prox LAD 40% stenosed. Stent placed and patient taken to [facility] ICU.  Concern for possible DKA.  PCCM consulted for medical management s/p IV fluids and insulin drip started and transitioned off insulin drip to sq insulin 01/16/23  and transferred out of ICU to [facility] 5/2 At baseline he cannot walk due to his hip fracture and surgery on left. He is comfortable with insulin but has not learned yet. He had abnormal left toe-brachial index on left reviewed by Dr. 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   Seen by vascular and on 01/18/23 underwent aortogram with bilateral lower extremity runoff> no significant aortoiliac occlusive disease/femoral-popliteal occlusive disease>no significant renal artery stenosis patent infrarenal abdominal aorta and bilateral common and external iliac artery, 20% stenosis within the proximal popliteal artery pedal arch is intact with good distal perfusion on right, 60% stenosis within the proximal part of the anterior tibial artery on left -per vascular he should have adequate flow for wound healing and no intervention recommended. Insulin dose adjusted to further optimize his hyperglycemia

## 2023-01-17 NOTE — Progress Notes (Signed)
CARDIAC REHAB PHASE I   Post MI/stent education completed. Pt has ongoing mobility limitations. PT/OT assisting with mobility assessment. Workup for possible discharge to SNF. Referral in place for CRP2 at [facility]. However due to ongoing mobility limitations and other health concerns is not appropriate for CRP2 at this time. CRP1 will sign off.   [id number]  Woodroe Chen, RN BSN 01/17/2023 9:38 AM

## 2023-01-17 NOTE — Progress Notes (Addendum)
PROGRESS NOTE Micheal Moore.  ZOX:[id number] DOB: December 17, 1950 DOA: 01/15/2023 PCP: [name], MD  Brief Narrative/Hospital Course: 72 year old male with pertinent PMH DMT2, Chronic HFpEF, syncope secondary to orthostasis, previous DVT no longer on [facility], tobacco abuse quit 8 months ago presents to [facility] ED on 4/30 for STEMI. He went to PCP and CBG found to be 500s so was sent to [facility] ED for further eval.  On arrival patient EKG showing ST elevations in inferior leads.  Patient also complaining of some dizziness on arrival patient hypotensive).  Patient denied any chest pain or SOB.  Patient was given heparin, aspirin, Zofran.  Cardiology consulted and patient transferred to Cath Lab.  Cath showing Prox RCA 80% stenosed and Prox LAD 40% stenosed. Stent placed and patient taken to [facility] ICU.  Concern for possible DKA.  PCCM consulted for medical management s/p IV fluids and insulin drip started and transitioned off insulin drip to sq insulin 01/16/23  and [facility] At baseline he cannot walk due to his hip fracture and surgery on left. He is comfortable with insulin but has not learned yet. He had abnormal left toe-brachial index on left reviewed by Dr. 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     Subjective: Seen and examined resting comfortably has no complaints. Blood sugar borderline controlled Assessment and Plan: Principal Problem:   STEMI (ST elevation myocardial infarction) (HCC) Active Problems:   Diabetic ketoacidosis without coma associated with type 2 diabetes mellitus (HCC)   Hyperglycemia   Inferior STEMI: S/P cardiac cath and stent placement.  Continue DAPT x 1 year, atorvastatin 80 mg, TTE with normal function no need for beta-blocker, follow-up with cardiology as outpatient  HLD continue high intensity atorvastatin  DKA with uncontrolled diabetes mellitus: He will need insulin regimen upon  discharge DM coordinator RN following for insulin injection hyper glycemia hypoglycemia and insulin teaching.  A1c at 13. Will do Semglee 25 units bedtime, continue SSI.  Once a day dosing would be suitable for him given his new onset diabetes, he is still not well versed with insulin injection and learning today. Recent Labs  Lab 01/15/23 1601 01/15/23 1623 01/16/23 2046 01/17/23 0003 01/17/23 0404 01/17/23 0745 01/17/23 1040  GLUCAP  --    < > 212* 245* 195* 133* 166*  HGBA1C 13.3*  --   --   --   --   --   --    < > = values in this interval not displayed.    PVD with left abnormal ABI toe brachial index-does have a wound on his left toes at the amputated site and second toe-Dr. Sherral Hammers from [facility] consulted.  Deconditioning/debility/frailty:patient reports he has not been ambulatory  Orthostatic hypotension continue midodrine.  Florinef held due to DKA-resume Hyponatremia/pseudohyponatremia due to hyperglycemia and also component of dehydration from DKA monitor bmp Leukocytosis but afebrile -resolved Hypokalemia/hypomagnesemia replaced, monitor Recent Labs  Lab 01/15/23 1601 01/15/23 1754 01/15/23 2044 01/16/23 0557 01/17/23 0056  K 3.8 3.9 4.4 3.4* 3.8  CALCIUM 9.7 9.6 9.6 9.2 8.7*  MG  --   --   --  1.7 1.9   Pressure injury POA on right buttock stage II, healed right and left, foot as below Pressure Injury 01/15/23 Buttocks Right Stage 2 -  Partial thickness loss of dermis presenting as a shallow open injury with a red, pink wound bed without slough. (Active)  01/15/23   Location: Buttocks  Location Orientation: Right  Staging: Stage 2 -  Partial thickness loss of dermis presenting as a shallow open injury with a red, pink wound bed without slough.  Wound Description (Comments):   Present on Admission: Yes  Dressing Type Foam - Lift dressing to assess site every shift 01/16/23 0800     Pressure Injury 01/15/23 Heel Right;Left Stage 2 -  Partial thickness loss of dermis  presenting as a shallow open injury with a red, pink wound bed without slough. dry scabbed healing pressure injuries to bilat heels (Active)  01/15/23   Location: Heel  Location Orientation: Right;Left  Staging: Stage 2 -  Partial thickness loss of dermis presenting as a shallow open injury with a red, pink wound bed without slough.  Wound Description (Comments): dry scabbed healing pressure injuries to bilat heels  Present on Admission: Yes     Pressure Injury 01/15/23 Foot Left;Anterior Deep Tissue Pressure Injury - Purple or maroon localized area of discolored intact skin or blood-filled blister due to damage of underlying soft tissue from pressure and/or shear. (Active)  01/15/23   Location: Foot  Location Orientation: Left;Anterior  Staging: Deep Tissue Pressure Injury - Purple or maroon localized area of discolored intact skin or blood-filled blister due to damage of underlying soft tissue from pressure and/or shear.  Wound Description (Comments):   Present on Admission: Yes     Pressure Injury 01/15/23 Foot Left;Anterior Deep Tissue Pressure Injury - Purple or maroon localized area of discolored intact skin or blood-filled blister due to damage of underlying soft tissue from pressure and/or shear. inner foot (Active)  01/15/23   Location: Foot  Location Orientation: Left;Anterior  Staging: Deep Tissue Pressure Injury - Purple or maroon localized area of discolored intact skin or blood-filled blister due to damage of underlying soft tissue from pressure and/or shear.  Wound Description (Comments): inner foot  Present on Admission: Yes   DVT prophylaxis: enoxaparin (LOVENOX) injection 40 mg Start: 01/15/23 1500 heparin injection 4,000 Units Start: 01/15/23 1145 Code Status:   Code Status: Full Code Family Communication: plan of care discussed with patient at bedside. Patient status is:  inpatient  because of dka mi Level of care: Telemetry Cardiac   Dispo: The patient is from:  home w/ wife.            Anticipated disposition: home in 24 hrs once abel to so self insulin inj Objective: Vitals last 24 hrs: Vitals:   01/17/23 0403 01/17/23 0500 01/17/23 0746 01/17/23 1035  BP: 102/72  (!) 142/43 (!) 159/81  Pulse: (!) 35 80 95 98  Resp: 20  18 20   Temp: 98.2 F (36.8 C)  97.9 F (36.6 C) 98 F (36.7 C)  TempSrc: Oral  Oral Oral  SpO2: 98%  99% 99%  Weight:      Height:       Weight change: -2.8 kg  Physical Examination: General exam: alert awake, older than stated age HEENT:Oral mucosa moist, Ear/Nose WNL grossly Respiratory system: bilaterally clear BS, no use of accessory muscle Cardiovascular system: S1 & S2 +, No JVD. Gastrointestinal system: Abdomen soft,NT,ND, BS+ Nervous System:Alert, awake, moving extremities. Extremities: LE edema neg, rt 2nd toe w/ wound and stump with wounddistal peripheral pulses palpable.  Skin: No rashes,no icterus. MSK: Normal muscle bulk,tone, power  Medications reviewed:  Scheduled Meds:  aspirin  81 mg Oral Daily   atorvastatin  80 mg Oral Daily   Chlorhexidine Gluconate Cloth  6 each  Topical Q0600   enoxaparin (LOVENOX) injection  40 mg Subcutaneous Q24H   heparin  4,000 Units Intravenous Once   insulin aspart  0-5 Units Subcutaneous QHS   insulin aspart  0-9 Units Subcutaneous TID WC   insulin glargine-yfgn  25 Units Subcutaneous QHS   leptospermum manuka honey  1 Application Topical Daily   midodrine  2.5 mg Oral TID WC   mupirocin ointment  1 Application Nasal BID   ondansetron (ZOFRAN) IV  4 mg Intravenous Once   sodium chloride flush  3 mL Intravenous Q12H   ticagrelor  90 mg Oral BID   Continuous Infusions:  sodium chloride      Diet Order             Diet heart healthy/carb modified Room service appropriate? Yes; Fluid consistency: Thin  Diet effective now                  Intake/Output Summary (Last 24 hours) at 01/17/2023 1320 Last data filed at 01/17/2023 1239 Gross per 24 hour  Intake  835.37 ml  Output 2650 ml  Net -1814.63 ml   Net IO Since Admission: 3,491.72 mL [01/17/23 1320]  Wt Readings from Last 3 Encounters:  01/17/23 84.2 kg  10/01/22 86.2 kg  08/23/22 79.9 kg     Unresulted Labs (From admission, onward)     Start     Ordered   01/22/23 0500  Creatinine, serum  (enoxaparin (LOVENOX)    CrCl >/= 30 ml/min)  Weekly,   R     Comments: while on enoxaparin therapy    01/15/23 1400   01/16/23 0500  Lipoprotein A (LPA)  Tomorrow morning,   R        01/15/23 1407   01/16/23 0500  CBC  Daily,   R      01/15/23 1350          Data Reviewed: I have personally reviewed following labs and imaging studies CBC: Recent Labs  Lab 01/15/23 1111 01/15/23 1214 01/16/23 0557 01/17/23 0056  WBC 15.1*  --  9.6 8.5  NEUTROABS 11.6*  --   --   --   HGB 13.2 12.2* 11.1* 11.7*  HCT 40.6 36.0* 34.2* 34.9*  MCV 94.0  --  92.4 90.9  PLT 279  --  223 212   Basic Metabolic Panel: Recent Labs  Lab 01/15/23 1601 01/15/23 1754 01/15/23 2044 01/16/23 0557 01/17/23 0056  NA 136 136 136 137 129*  K 3.8 3.9 4.4 3.4* 3.8  CL 99 100 101 101 97*  CO2 21* 21* 19* 24 22  GLUCOSE 390* 261* 162* 176* 260*  BUN 13 13 13 12 13   CREATININE 0.90 0.88 0.75 0.64 0.64  CALCIUM 9.7 9.6 9.6 9.2 8.7*  MG  --   --   --  1.7 1.9   GFR: Estimated Creatinine Clearance: 99.4 mL/min (by C-G formula based on SCr of 0.64 mg/dL). Liver Function Tests: Recent Labs  Lab 01/15/23 1111  AST 20  ALT 11  ALKPHOS 103  BILITOT 1.1  PROT 6.7  ALBUMIN 3.2*  Lipid Profile: Recent Labs    01/16/23 0557  CHOL 146  HDL 38*  LDLCALC 94  TRIG 68  CHOLHDL 3.8   Recent Results (from the past 240 hour(s))  SARS Coronavirus 2 by RT PCR (hospital order, performed in [facility] hospital lab) *cepheid single result test* Anterior Nasal Swab     Status: None   Collection Time: 01/15/23  3:20  PM   Specimen: Anterior Nasal Swab  Result Value Ref Range Status   SARS Coronavirus 2 by RT PCR  NEGATIVE NEGATIVE Final    Comment: Performed at [facility] Lab, [address], [place], [place] [address]  MRSA Next Gen by PCR, Nasal     Status: Abnormal   Collection Time: 01/15/23  3:26 PM   Specimen: Anterior Nasal Swab  Result Value Ref Range Status   MRSA by PCR Next Gen DETECTED (A) NOT DETECTED Final    Comment: RESULT CALLED TO, READ BACK BY AND VERIFIED WITH: RN  AShela Nevin [phone] @ 1702 FH (NOTE) The GeneXpert MRSA Assay (FDA approved for NASAL specimens only), is one component of a comprehensive MRSA colonization surveillance program. It is not intended to diagnose MRSA infection nor to guide or monitor treatment for MRSA infections. Test performance is not FDA approved in patients less than 40 years old. Performed at [facility], [address] [place], [place] [address]     Antimicrobials: Anti-infectives (From admission, onward)    None      Culture/Microbiology    Component Value Date/Time   SDES BLOOD SITE NOT SPECIFIED 04/27/2020 0904   SPECREQUEST  04/27/2020 0904    BOTTLES DRAWN AEROBIC AND ANAEROBIC Blood Culture results may not be optimal due to an excessive volume of blood received in culture bottles   CULT  04/27/2020 0904    NO GROWTH 5 DAYS Performed at [facility] Lab, [address], [place], [place] [address]    REPTSTATUS 05/02/2020 FINAL 04/27/2020 9562  Other culture-see note  Radiology Studies: VAS [place] ABI WITH/WO TBI  Result Date: 01/16/2023  LOWER EXTREMITY DOPPLER STUDY Patient Name:  Micheal Moore.  Date of Exam:   01/16/2023 Medical Rec #: [id number]                  Accession #:    [id number] Date of Birth: [birth year]                  Patient Gender: M Patient Age:   67 years Exam Location:  [facility] Procedure:      VAS [place] ABI WITH/WO TBI Referring Phys: CALLIE GOODRICH --------------------------------------------------------------------------------  Indications: Ulceration. Left great toe amputation High  Risk Factors: Diabetes, past history of smoking.  Comparison Study: Previous study 04/28/20. Performing Technologist: McKayla Maag RVT, [place]  Examination Guidelines: A complete evaluation includes at minimum, Doppler waveform signals and systolic blood pressure reading at the level of bilateral brachial, anterior tibial, and posterior tibial arteries, when vessel segments are accessible. Bilateral testing is considered an integral part of a complete examination. Photoelectric Plethysmograph (PPG) waveforms and toe systolic pressure readings are included as required and additional duplex testing as needed. Limited examinations for reoccurring indications may be performed as noted.  ABI Findings: +---------+------------------+-----+---------+--------+ Right    Rt Pressure (mmHg)IndexWaveform Comment  +---------+------------------+-----+---------+--------+ Brachial 141                    triphasic         +---------+------------------+-----+---------+--------+ PTA      192               1.36 triphasic         +---------+------------------+-----+---------+--------+ DP       195               1.38 triphasic         +---------+------------------+-----+---------+--------+ Cristela Blue  1.62 Normal            +---------+------------------+-----+---------+--------+ +---------+------------------+-----+---------+---------------------------------+ Left     Lt Pressure (mmHg)IndexWaveform Comment                           +---------+------------------+-----+---------+---------------------------------+ Brachial 127                    triphasic                                  +---------+------------------+-----+---------+---------------------------------+ PTA      180               1.28 triphasic                                  +---------+------------------+-----+---------+---------------------------------+ DP       181               1.28 triphasic                                   +---------+------------------+-----+---------+---------------------------------+ Great Toe84                0.60 Abnormal Obtained on second toe due to                                              great toe amputation.             +---------+------------------+-----+---------+---------------------------------+ +-------+-----------+-----------+--------------------------------+------------+ ABI/TBIToday's ABIToday's TBIPrevious ABI                    Previous TBI +-------+-----------+-----------+--------------------------------+------------+ Right  1.38       1.62       No pressures obtained due to DVT             +-------+-----------+-----------+--------------------------------+------------+ Left   1.28       0.60       1.10                                         +-------+-----------+-----------+--------------------------------+------------+  Summary: Right: Resting right ankle-brachial index is within normal range. The right toe-brachial index is normal. Left: Resting left ankle-brachial index is within normal range. The left toe-brachial index is abnormal. *See table(s) above for measurements and observations.  Electronically signed by Coral Else MD on 01/16/2023 at 10:14:23 PM.    Final    ECHOCARDIOGRAM COMPLETE  Result Date: 01/16/2023    ECHOCARDIOGRAM REPORT   Patient Name:   Micheal Moore. Date of Exam: 01/16/2023 Medical Rec #:  [id number]                 Height:       77.0 in Accession #:    [id number]                Weight:       185.6 lb Date of Birth:  16-Oct-1950                 BSA:  2.167 m Patient Age:    72 years                  BP:           135/73 mmHg Patient Gender: M                         HR:           81 bpm. Exam Location:  Inpatient Procedure: 2D Echo, Cardiac Doppler, Color Doppler and Intracardiac            Opacification Agent Indications:    122-I22.9 Subsequent ST elevation (STEM) and non-ST elevation                  (NSTEMI) myocardial infarction  History:        Patient has prior history of Echocardiogram examinations, most                 recent 12/19/2021. Acute MI and CAD, Signs/Symptoms:Hypotension                 and Syncope; Risk Factors:Diabetes and Former Smoker.  Sonographer:    Sheralyn Boatman RDCS Referring Phys: [id number] Charlies Constable [facility]  Sonographer Comments: Technically difficult study due to poor echo windows and suboptimal parasternal window. IMPRESSIONS  1. Left ventricular ejection fraction, by estimation, is 50 to 55%. The left ventricle has low normal function. The left ventricle has no regional wall motion abnormalities. There is mild concentric left ventricular hypertrophy. Left ventricular diastolic parameters are consistent with Grade I diastolic dysfunction (impaired relaxation).  2. Right ventricular systolic function is normal. The right ventricular size is normal.  3. The mitral valve is degenerative. No evidence of mitral valve regurgitation.  4. The aortic valve was not well visualized. Aortic valve regurgitation is not visualized. No aortic stenosis is present.  5. The inferior vena cava is normal in size with greater than 50% respiratory variability, suggesting right atrial pressure of 3 mmHg. Comparison(s): No significant change from prior study. FINDINGS  Left Ventricle: Left ventricular ejection fraction, by estimation, is 50 to 55%. The left ventricle has low normal function. The left ventricle has no regional wall motion abnormalities. Definity contrast agent was given IV to delineate the left ventricular endocardial borders. The left ventricular internal cavity size was normal in size. There is mild concentric left ventricular hypertrophy. Left ventricular diastolic parameters are consistent with Grade I diastolic dysfunction (impaired relaxation). Right Ventricle: The right ventricular size is normal. No increase in right ventricular wall thickness. Right ventricular systolic function is normal.  Left Atrium: Left atrial size was normal in size. Right Atrium: Right atrial size was normal in size. Pericardium: There is no evidence of pericardial effusion. Mitral Valve: Calcified leaflet tip. The mitral valve is degenerative in appearance. No evidence of mitral valve regurgitation. Tricuspid Valve: The tricuspid valve is normal in structure. Tricuspid valve regurgitation is not demonstrated. Aortic Valve: The aortic valve was not well visualized. Aortic valve regurgitation is not visualized. No aortic stenosis is present. Pulmonic Valve: The pulmonic valve was not well visualized. Pulmonic valve regurgitation is not visualized. Aorta: The aortic root is normal in size and structure. Venous: The inferior vena cava is normal in size with greater than 50% respiratory variability, suggesting right atrial pressure of 3 mmHg. IAS/Shunts: The atrial septum is grossly normal.  LEFT VENTRICLE PLAX 2D LVIDd:         4.70 cm  Diastology LVIDs:         3.50 cm     LV e' medial:    4.13 cm/s LV PW:         1.10 cm     LV E/e' medial:  14.1 LV IVS:        1.20 cm     LV e' lateral:   7.51 cm/s LVOT diam:     2.50 cm     LV E/e' lateral: 7.8 LV SV:         81 LV SV Index:   38 LVOT Area:     4.91 cm  LV Volumes (MOD) LV vol d, MOD A2C: 75.7 ml LV vol d, MOD A4C: 77.4 ml LV vol s, MOD A2C: 28.9 ml LV vol s, MOD A4C: 36.9 ml LV SV MOD A2C:     46.8 ml LV SV MOD A4C:     77.4 ml LV SV MOD BP:      47.6 ml RIGHT VENTRICLE             IVC RV S prime:     10.40 cm/s  IVC diam: 1.70 cm TAPSE (M-mode): 1.4 cm LEFT ATRIUM           Index        RIGHT ATRIUM           Index LA diam:      3.00 cm 1.38 cm/m   RA Area:     16.80 cm LA Vol (A2C): 21.8 ml 10.06 ml/m  RA Volume:   41.90 ml  19.34 ml/m LA Vol (A4C): 25.0 ml 11.54 ml/m  AORTIC VALVE LVOT Vmax:   85.50 cm/s LVOT Vmean:  54.400 cm/s LVOT VTI:    0.166 m  AORTA Ao Root diam: 3.70 cm MITRAL VALVE MV Area (PHT): 3.53 cm    SHUNTS MV Decel Time: 215 msec    Systemic VTI:   0.17 m MV E velocity: 58.30 cm/s  Systemic Diam: 2.50 cm MV A velocity: 70.70 cm/s MV E/A ratio:  0.82 Riley Lam MD Electronically signed by Riley Lam MD Signature Date/Time: 01/16/2023/11:53:08 AM    Final      LOS: 2 days   Lanae Boast, MD [facility]  01/17/2023, 1:20 PM

## 2023-01-17 NOTE — TOC Progression Note (Addendum)
Transition of Care (TOC) - Progression Note    Patient Details  Name: Micheal Moore. MRN: [id number] Date of Birth: [birth year]  Transition of Care [facility]) CM/SW Contact  Leone Haven, RN Phone Number: 01/17/2023, 4:07 PM  Clinical Narrative:    NCM spoke with patient at the bedside, he states he has a hospital bed and a w/chair at home.  He is refusing SNF.  He was just recently with [facility] for [facility], and HHPT, he states he would like to continue with them , but he states his wife may not want him to have HH. This NCM asked if he wanted it to be set up or not, he said yes to go ahead and set it up and he stated to not call his wife because she is at work, she works from 1 pm to 10 pm.  He states Zack her son is with him while she is at work.  Patient states he can not walk and the [facility] tries to get him to walk, NCM informed him that will let them know to do ROM with him. Patient states he has transportation to his [place] [facility] but could not remember name of transport company.  NCM made referral to [facility] with [facility] for HHRN, HHPT.  She is able to take referral , soc will begin 24 to 48 hrs post dc.  Patient will need ambulance transport home at dc. NCM asked patient what kind of issues are they having with transportation, he states he he is not sure he will have to ask his wife when she gets off work.  He will need ambulance transport at dc, and he says he notifies the company that transports him to [place] [facility] 2 days ahead of time, so not sure what issues they are having.     Expected Discharge Plan: Home/Self Care Barriers to Discharge: Continued Medical Work up  Expected Discharge Plan and Services In-house Referral: Clinical Social Work Discharge Planning Services: CM Consult   Living arrangements for the past 2 months: Single Family Home                                       Social Determinants of Health (SDOH) Interventions SDOH Screenings   Food Insecurity:  No Food Insecurity (01/16/2023)  Housing: Low Risk  (01/16/2023)  Transportation Needs: No Transportation Needs (01/16/2023)  Utilities: Not At Risk (01/16/2023)  Tobacco Use: High Risk (01/16/2023)    Readmission Risk Interventions    07/25/2022    2:42 PM  Readmission Risk Prevention Plan  Transportation Screening   PCP or Specialist Appt within 5-7 Days   Home Care Screening   Medication Review (RN CM)      Information is confidential and restricted. Go to Review Flowsheets to unlock data.

## 2023-01-17 NOTE — Inpatient Diabetes Management (Addendum)
Inpatient Diabetes Program Recommendations  AACE/ADA: New Consensus Statement on Inpatient Glycemic Control (2015)  Target Ranges:  Prepandial:   less than 140 mg/dL      Peak postprandial:   less than 180 mg/dL (1-2 hours)      Critically ill patients:  140 - 180 mg/dL   Lab Results  Component Value Date   GLUCAP 166 (H) 01/17/2023   HGBA1C 13.3 (H) 01/15/2023    Review of Glycemic Control  Latest Reference Range & Units 01/16/23 17:31 01/16/23 20:46 01/17/23 00:03 01/17/23 04:04 01/17/23 07:45 01/17/23 10:40  Glucose-Capillary 70 - 99 mg/dL 161 (H) 096 (H) 045 (H) 195 (H) 133 (H) 166 (H)  (H): Data is abnormally high Diabetes history: DM 2 Outpatient Diabetes medications: Amaryl 2 mg Daily Current orders for Inpatient glycemic control:  Levemir 12 units Q12 hours Novolog 0-9 units TID & HS   Inpatient Diabetes Program Recommendations:    Consider adding Semglee 25 units QHS.   Addendum: Spoke with patient and wife again to reinforce concepts discussed on 5/1 regarding outpatient diabetes management.  Reviewed patient's current A1c of 13.3%. Explained what a A1c is and what it measures. Also reviewed goal A1c with patient, importance of good glucose control @ home, and blood sugar goals. Reviewed role of pancreas, patho of DM, DKA, hypo vs hyper glycemia, survival skills, interventions, vascular changes, impact with STEMI, and commorbidities.  Patient will need meter at discharge. Education provided on frequency of checking blood sugars and when to call MD.  Educated patient and spouse on insulin pen use at home. Reviewed contents of insulin flexpen starter kit. Reviewed all steps if insulin pen including attachment of needle, 2-unit air shot, dialing up dose, giving injection, removing needle, disposal of sharps, storage of unused insulin, disposal of insulin etc. Patient able to provide successful return demonstration. Also reviewed troubleshooting with insulin pen. MD to give  patient Rxs for insulin pens and insulin pen needles.  Thanks, Lujean Rave, MSN, RNC-OB Diabetes Coordinator [phone] (8a-5p)    Thanks, Lujean Rave, MSN, RNC-OB Diabetes Coordinator [phone] (8a-5p)

## 2023-01-17 NOTE — H&P (View-Only) (Signed)
Hospital Consult    Reason for Consult:  Abnormal ABIs with non healing wounds Requesting Physician:  Kc Ramesh,MD MRN #:  [id number]  History of Present Illness: This is a 72 y.o. male with pertinent past medical history of Type II DM, CHF, DVT, Tobacco abuse, and CAD who presented to ER with nausea per PCP recommendation. He had presented to PCP office initially and was found to be hyperglycemic with CBG of 500s. He was sent to ED for further evaluation. On presentation found to have STEMI now s/p PCI 4/30 on Brillinta. Of note he was found to have chronic venous changes and wounds on bilateral feet. 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 Vascular surgery was consulted for evaluation of his bilateral lower extremity wounds.  He is not the best historian. He explains that he has had multiple falls over past couple years and does not currently ambulate.He mostly is in wheel chair. He says ever since he dislocated his left hip he is unable to use his left leg. He says he has chronic pain in his legs from his multiple orthopedic procedures to BLE. He reports some numbness around his ankles but says he has full feeling in his feet. He explains that he is unsure how long the wounds on his feet and legs have been there but that his wounds are likely from his bed post. He says he kicks the bed post often trying to get up or move as he is too tall for the bed. He says he tries to elevate his legs but he can't elevate them very high because he is unable to lift his left leg. He explains that he lives with his son and wife but that his wife works during the day so often times he is by himself. Since admission he has undergone non invasive studies showing ABIs that are essentially normal with triphasic flow however abnormal toe pressures. He is currently on Aspirin, statin, and Brillinta.  Past Medical History:  Diagnosis Date   Cancer (HCC)    right elbow melanoma    Diabetes mellitus without complication (HCC)    Type II   Dizziness    DVT (deep venous thrombosis) (HCC)    right leg   Frequent falls    Near syncope    Syncope     Past Surgical History:  Procedure Laterality Date   AMPUTATION TOE Left 04/28/2020   Procedure: AMPUTATION LEFT GREAT TOE;  Surgeon: Duda, Marcus V, MD;  Location: [facility] OR;  Service: Orthopedics;  Laterality: Left;   CORONARY/GRAFT ACUTE MI REVASCULARIZATION N/A 01/15/2023   Procedure: Coronary/Graft Acute MI Revascularization;  Surgeon: Thukkani, Arun K, MD;  Location: [facility];  Service: Cardiovascular;  Laterality: N/A;   INTRAMEDULLARY (IM) NAIL INTERTROCHANTERIC Right 02/23/2022   Procedure: INTRAMEDULLARY (IM) NAIL INTERTROCHANTRIC, RIGHT;  Surgeon: Swinteck, Brian, MD;  Location: [facility];  Service: Orthopedics;  Laterality: Right;   LEFT HEART CATH AND CORONARY ANGIOGRAPHY N/A 01/15/2023   Procedure: LEFT HEART CATH AND CORONARY ANGIOGRAPHY;  Surgeon: Thukkani, Arun K, MD;  Location: [facility];  Service: Cardiovascular;  Laterality: N/A;   MELANOMA EXCISION WITH SENTINEL LYMPH NODE BIOPSY Right 08/19/2020   Procedure: WIDE LOCAL EXCISION RIGHT ELBOW MELANOMA WITH RIGHT AXILLARY SENTINEL LYMPH NODE BIOPSY;  Surgeon: Allen, Shelby L, MD;  Location: [facility];  Service: General;  Laterality: Right;  2nd incision in axilla   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   Procedure:   TOTAL HIP ARTHROPLASTY ANTERIOR APPROACH;  Surgeon: Swinteck, Brian, MD;  Location: [facility];  Service: Orthopedics;  Laterality: Left;   TOTAL HIP ARTHROPLASTY Left 08/24/2022   Procedure: OPEN REDUCTION, HEAD AND LINER EXCHANGE;  Surgeon: Swinteck, Brian, MD;  Location: [facility];  Service: Orthopedics;  Laterality: Left;    No Known Allergies  Prior to Admission medications   Medication Sig Start Date End Date Taking? Authorizing Provider  aspirin EC 325 MG tablet Take 325 mg by mouth daily.   Yes [provider]  Ensure Max Protein  (ENSURE MAX PROTEIN) LIQD Take 330 mLs (11 oz total) by mouth 2 (two) times daily. Patient taking differently: Take 11 oz by mouth daily. 08/28/22  Yes Dahal, Binaya, MD  glimepiride (AMARYL) 2 MG tablet Take 2 mg by mouth daily.  01/26/19  Yes [provider]  midodrine (PROAMATINE) 5 MG tablet Take 0.5 tablets (2.5 mg total) by mouth 3 (three) times daily with meals. Patient taking differently: Take 7.5 mg by mouth 3 (three) times daily with meals. 07/25/22  Yes Thompson, Daniel V, MD  polyethylene glycol (MIRALAX / GLYCOLAX) 17 g packet Take 17 g by mouth daily. Patient taking differently: Take 17 g by mouth daily as needed for mild constipation. 03/05/22  Yes Patel, Pranav M, MD  fludrocortisone (FLORINEF) 0.1 MG tablet Take 2 tablets (0.2 mg total) by mouth daily. 03/05/22   Patel, Pranav M, MD  JANUVIA 100 MG tablet Take 100 mg by mouth daily. 12/27/22   [provider]  JARDIANCE 10 MG TABS tablet Take 10 mg by mouth daily. Patient not taking: Reported on 08/23/2022 07/05/22   [provider]  nystatin (MYCOSTATIN/NYSTOP) powder Apply 1 Application topically 3 (three) times daily. In the groin Patient not taking: Reported on 01/17/2023 08/28/22   Dahal, Binaya, MD  rosuvastatin (CRESTOR) 5 MG tablet Take 5 mg by mouth See admin instructions. 5 mg once daily on Tuesday, Saturday    [provider]    Social History   Socioeconomic History   Marital status: Married    Spouse name: Marie   Number of children: 1   Years of education: Not on file   Highest education level: Associate degree: occupational, technical, or vocational program  Occupational History    Comment: retired  Tobacco Use   Smoking status: Every Day    Packs/day: 0.20    Years: 19.00    Additional pack years: 0.00    Total pack years: 3.80    Types: Cigarettes   Smokeless tobacco: Never  Vaping Use   Vaping Use: Never used  Substance and Sexual Activity   Alcohol use: No   Drug  use: Not Currently   Sexual activity: Not on file  Other Topics Concern   Not on file  Social History Narrative   Lives with wife   Social Determinants of Health   Financial Resource Strain: Not on file  Food Insecurity: No Food Insecurity (01/16/2023)   Hunger Vital Sign    Worried About Running Out of Food in the Last Year: Never true    Ran Out of Food in the Last Year: Never true  Transportation Needs: No Transportation Needs (01/16/2023)   PRAPARE - Transportation    Lack of Transportation (Medical): No    Lack of Transportation (Non-Medical): No  Physical Activity: Not on file  Stress: Not on file  Social Connections: Not on file  Intimate Partner Violence: Not At Risk (01/16/2023)   Humiliation, Afraid, Rape,   and Kick questionnaire    Fear of Current or Ex-Partner: No    Emotionally Abused: No    Physically Abused: No    Sexually Abused: No     Family History  Problem Relation Age of Onset   Stroke Mother    Diabetes Father    Stroke Sister    Diabetes Sister     ROS: Otherwise negative unless mentioned in HPI  Physical Examination  Vitals:   01/17/23 0746 01/17/23 1035  BP: (!) 142/43 (!) 159/81  Pulse: 95 98  Resp: 18 20  Temp: 97.9 F (36.6 C) 98 F (36.7 C)  SpO2: 99% 99%   Body mass index is 22.01 kg/m.  General:  WDWN in NAD Gait: Not observed HENT: WNL, normocephalic Pulmonary: normal non-labored breathing, without wheezing Cardiac: regular rate and rhythm Abdomen: soft, NT/ND Vascular Exam/Pulses: 2+ femoral pulses, 2+ popliteal, 2+ DP pulses bilaterally. Feet warm and well perfused. Motor and sensation intact. He is unable to lift left leg up of of bed. Full ROM of right leg Extremities: with ischemic changes, without Gangrene , without cellulitis. Bilateral lower extremities are edematous, left greater than right. Bilateral leg wounds as shown below. Right great toe wound, right lateral leg wound, left 2nd toe wound and left lateral leg wounds  all present  Right great toe wound   Left lateral leg  Left 2nd toe wound Musculoskeletal: no muscle wasting or atrophy  Neurologic: A&O X 3;  No focal weakness or paresthesias are detected; speech is fluent/normal Psychiatric:  The pt has Normal affect.  CBC    Component Value Date/Time   WBC 8.5 01/17/2023 0056   RBC 3.84 (L) 01/17/2023 0056   HGB 11.7 (L) 01/17/2023 0056   HGB 13.1 12/04/2021 1429   HCT 34.9 (L) 01/17/2023 0056   HCT 37.5 12/04/2021 1429   PLT 212 01/17/2023 0056   PLT 225 12/04/2021 1429   MCV 90.9 01/17/2023 0056   MCV 95 12/04/2021 1429   MCH 30.5 01/17/2023 0056   MCHC 33.5 01/17/2023 0056   RDW 13.8 01/17/2023 0056   RDW 12.3 12/04/2021 1429   LYMPHSABS 2.4 01/15/2023 1111   MONOABS 0.8 01/15/2023 1111   EOSABS 0.1 01/15/2023 1111   BASOSABS 0.1 01/15/2023 1111    BMET    Component Value Date/Time   NA 129 (L) 01/17/2023 0056   NA 137 12/04/2021 1429   K 3.8 01/17/2023 0056   CL 97 (L) 01/17/2023 0056   CO2 22 01/17/2023 0056   GLUCOSE 260 (H) 01/17/2023 0056   BUN 13 01/17/2023 0056   BUN 17 12/04/2021 1429   CREATININE 0.64 01/17/2023 0056   CALCIUM 8.7 (L) 01/17/2023 0056   GFRNONAA >60 01/17/2023 0056   GFRAA >60 04/29/2020 0620    COAGS: Lab Results  Component Value Date   INR 1.1 01/15/2023   INR 1.0 07/19/2022   INR 1.1 02/25/2022     Non-Invasive Vascular Imaging:   +-------+-----------+-----------+--------------------------------+---------  ---+  ABI/TBIToday's ABIToday's TBIPrevious ABI                    Previous  TBI  +-------+-----------+-----------+--------------------------------+---------  ---+  Right 1.38       1.62       No pressures obtained due to DVT               +-------+-----------+-----------+--------------------------------+---------  ---+  Left  1.28       0.60       1.10                                            +-------+-----------+-----------+--------------------------------+---------  ---+     Statin:  Yes.   Beta Blocker:  No. Aspirin:  Yes.   ACEI:  No. ARB:  No. CCB use:  No Other antiplatelets/anticoagulants:  Yes.   Brillinta   ASSESSMENT/PLAN: This is a 72 y.o. male who presented with STEMI s/p PCI found to have bilateral lower extremity wounds. He has mixed venous and and arterial disease. His arterial disease is likely microvascular secondary to his Diabetes. His ABI shows normal triphasic flow with abnormal toe pressures. On examination he has palpable femoral, popliteal and DP pulses bilaterally. He would benefit from Angiogram of BLE to further define his blood flow and chances for wound healing. I discussed the risks/ benefits and alternatives with the patient. However, since he just underwent PCI with DES now on Brillinta would not recommend Angiography for at least 30 days as his Brillinta will need held. - Benefit from light compression and elevation of his legs - Continued local wound care. Wound benefit from referral to wound care center on D/c - Continue Aspirin and Statin  - We will arrange outpatient follow up to check on his BLE wounds and can arrange Angiogram as an outpatient The on call vascular surgeon, Dr. Saben Donigan, will patient later today to provide further recommendations  Corrina Baglia PA-C [facility] [phone] 01/17/2023  11:04 AM  VASCULAR STAFF ADDENDUM: I have independently interviewed and examined the patient. I agree with the above.  In short, patient is a 72-year-old male who was admitted to [place], and worked at [employer] for years prior to retirement. He lives by himself, and does not close with his only daughter. Earle has recent history of STEMI status post PCI on 4/30.  He has bilateral lower extremity wounds with imaging demonstrating microvascular disease from longstanding, uncontrolled, diabetes.  The wounds have been  present for months bilaterally.  He states the wounds occurred when he began elevating his legs on the bed. On physical exam, he had palpable femoral pulses, 2+ left DP, nonpalpable right  In evaluating his ABI, I am concerned that his wounds are a product of poor hygiene, likely failure to thrive at home, and small vessel disease. Dylan would benefit from bilateral lower extremity angiogram with emphasis on the right leg to evaluate lower extremity perfusion, and possibly improve flow for wound healing.  We discussed left sided access, bilateral lower extremity angiogram, possible intervention on the right.   He does not use his left leg, and spends the majority of his day in a wheelchair.  I am unsure of his open surgical candidacy, and plan to discuss his clinical status further with his wife.   After discussing risk and benefits of the above, Martine elected to proceed.  I discussed his case with Dr. Brabham, and have scheduled him for tomorrow.   Please make NPO midnight    J. Eli Manuelito Poage, MD [facility] Office Phone Number: [phone] 01/17/2023 3:53 PM   

## 2023-01-17 NOTE — Consult Note (Addendum)
Hospital Consult    Reason for Consult:  Abnormal ABIs with non healing wounds Requesting Physician:  Gweneth Dimitri MRN #:  [id number]  History of Present Illness: This is a 72 y.o. male with pertinent past medical history of Type II DM, CHF, DVT, Tobacco abuse, and CAD who presented to ER with nausea per PCP recommendation. He had presented to PCP office initially and was found to be hyperglycemic with CBG of 500s. He was sent to ED for further evaluation. On presentation found to have STEMI now s/p PCI 4/30 on Brillinta. Of note he was found to have chronic venous changes and wounds on bilateral feet. 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 Vascular surgery was consulted for evaluation of his bilateral lower extremity wounds.  He is not the best historian. He explains that he has had multiple falls over past couple years and does not currently ambulate.He mostly is in wheel chair. He says ever since he dislocated his left hip he is unable to use his left leg. He says he has chronic pain in his legs from his multiple orthopedic procedures to BLE. He reports some numbness around his ankles but says he has full feeling in his feet. He explains that he is unsure how long the wounds on his feet and legs have been there but that his wounds are likely from his bed post. He says he kicks the bed post often trying to get up or move as he is too tall for the bed. He says he tries to elevate his legs but he can't elevate them very high because he is unable to lift his left leg. He explains that he lives with his son and wife but that his wife works during the day so often times he is by himself. Since admission he has undergone non invasive studies showing ABIs that are essentially normal with triphasic flow however abnormal toe pressures. He is currently on Aspirin, statin, and Brillinta.  Past Medical History:  Diagnosis Date   Cancer (HCC)    right elbow melanoma    Diabetes mellitus without complication (HCC)    Type II   Dizziness    DVT (deep venous thrombosis) (HCC)    right leg   Frequent falls    Near syncope    Syncope     Past Surgical History:  Procedure Laterality Date   AMPUTATION TOE Left 04/28/2020   Procedure: AMPUTATION LEFT GREAT TOE;  Surgeon: Nadara Mustard, MD;  Location: [facility];  Service: Orthopedics;  Laterality: Left;   CORONARY/GRAFT ACUTE MI REVASCULARIZATION N/A 01/15/2023   Procedure: Coronary/Graft Acute MI Revascularization;  Surgeon: Orbie Pyo, MD;  Location: [facility];  Service: Cardiovascular;  Laterality: N/A;   INTRAMEDULLARY (IM) NAIL INTERTROCHANTERIC Right 02/23/2022   Procedure: INTRAMEDULLARY (IM) NAIL INTERTROCHANTRIC, RIGHT;  Surgeon: Samson Frederic, MD;  Location: [facility];  Service: Orthopedics;  Laterality: Right;   LEFT HEART CATH AND CORONARY ANGIOGRAPHY N/A 01/15/2023   Procedure: LEFT HEART CATH AND CORONARY ANGIOGRAPHY;  Surgeon: Orbie Pyo, MD;  Location: [facility];  Service: Cardiovascular;  Laterality: N/A;   MELANOMA EXCISION WITH SENTINEL LYMPH NODE BIOPSY Right 08/19/2020   Procedure: WIDE LOCAL EXCISION RIGHT ELBOW MELANOMA WITH RIGHT AXILLARY SENTINEL LYMPH NODE BIOPSY;  Surgeon: Fritzi Mandes, MD;  Location: [facility];  Service: General;  Laterality: Right;  2nd incision in axilla   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   Procedure:  TOTAL HIP ARTHROPLASTY ANTERIOR APPROACH;  Surgeon: Samson Frederic, MD;  Location: [facility];  Service: Orthopedics;  Laterality: Left;   TOTAL HIP ARTHROPLASTY Left 08/24/2022   Procedure: OPEN REDUCTION, HEAD AND LINER EXCHANGE;  Surgeon: Samson Frederic, MD;  Location: [facility];  Service: Orthopedics;  Laterality: Left;    No Known Allergies  Prior to Admission medications   Medication Sig Start Date End Date Taking? Authorizing Provider  aspirin EC 325 MG tablet Take 325 mg by mouth daily.   Yes [provider]  Ensure Max Protein  (ENSURE MAX PROTEIN) LIQD Take 330 mLs (11 oz total) by mouth 2 (two) times daily. Patient taking differently: Take 11 oz by mouth daily. 08/28/22  Yes Dahal, Melina Schools, MD  glimepiride (AMARYL) 2 MG tablet Take 2 mg by mouth daily.  01/26/19  Yes [provider]  midodrine (PROAMATINE) 5 MG tablet Take 0.5 tablets (2.5 mg total) by mouth 3 (three) times daily with meals. Patient taking differently: Take 7.5 mg by mouth 3 (three) times daily with meals. 07/25/22  Yes Rodolph Bong, MD  polyethylene glycol (MIRALAX / GLYCOLAX) 17 g packet Take 17 g by mouth daily. Patient taking differently: Take 17 g by mouth daily as needed for mild constipation. 03/05/22  Yes Rolly Salter, MD  fludrocortisone (FLORINEF) 0.1 MG tablet Take 2 tablets (0.2 mg total) by mouth daily. 03/05/22   Rolly Salter, MD  JANUVIA 100 MG tablet Take 100 mg by mouth daily. 12/27/22   [provider]  JARDIANCE 10 MG TABS tablet Take 10 mg by mouth daily. Patient not taking: Reported on 08/23/2022 07/05/22   [provider]  nystatin (MYCOSTATIN/NYSTOP) powder Apply 1 Application topically 3 (three) times daily. In the groin Patient not taking: Reported on 01/17/2023 08/28/22   Lorin Glass, MD  rosuvastatin (CRESTOR) 5 MG tablet Take 5 mg by mouth See admin instructions. 5 mg once daily on Tuesday, Saturday    [provider]    Social History   Socioeconomic History   Marital status: Married    Spouse name: [name]   Number of children: 1   Years of education: Not on file   Highest education level: Associate degree: occupational, Scientist, product/process development, or vocational program  Occupational History    Comment: retired  Tobacco Use   Smoking status: Every Day    Packs/day: 0.20    Years: 19.00    Additional pack years: 0.00    Total pack years: 3.80    Types: Cigarettes   Smokeless tobacco: Never  Vaping Use   Vaping Use: Never used  Substance and Sexual Activity   Alcohol use: No   Drug  use: Not Currently   Sexual activity: Not on file  Other Topics Concern   Not on file  Social History Narrative   Lives with wife   Social Determinants of Health   Financial Resource Strain: Not on file  Food Insecurity: No Food Insecurity (01/16/2023)   Hunger Vital Sign    Worried About Running Out of Food in the Last Year: Never true    Ran Out of Food in the Last Year: Never true  Transportation Needs: No Transportation Needs (01/16/2023)   PRAPARE - Administrator, Civil Service (Medical): No    Lack of Transportation (Non-Medical): No  Physical Activity: Not on file  Stress: Not on file  Social Connections: Not on file  Intimate Partner Violence: Not At Risk (01/16/2023)   Humiliation, Afraid, Rape,  and Kick questionnaire    Fear of Current or Ex-Partner: No    Emotionally Abused: No    Physically Abused: No    Sexually Abused: No     Family History  Problem Relation Age of Onset   Stroke Mother    Diabetes Father    Stroke Sister    Diabetes Sister     ROS: Otherwise negative unless mentioned in HPI  Physical Examination  Vitals:   01/17/23 0746 01/17/23 1035  BP: (!) 142/43 (!) 159/81  Pulse: 95 98  Resp: 18 20  Temp: 97.9 F (36.6 C) 98 F (36.7 C)  SpO2: 99% 99%   Body mass index is 22.01 kg/m.  General:  WDWN in NAD Gait: Not observed HENT: WNL, normocephalic Pulmonary: normal non-labored breathing, without wheezing Cardiac: regular rate and rhythm Abdomen: soft, NT/ND Vascular Exam/Pulses: 2+ femoral pulses, 2+ popliteal, 2+ DP pulses bilaterally. Feet warm and well perfused. Motor and sensation intact. He is unable to lift left leg up of of bed. Full ROM of right leg Extremities: with ischemic changes, without Gangrene , without cellulitis. Bilateral lower extremities are edematous, left greater than right. Bilateral leg wounds as shown below. Right great toe wound, right lateral leg wound, left 2nd toe wound and left lateral leg wounds  all present  Right great toe wound   Left lateral leg  Left 2nd toe wound Musculoskeletal: no muscle wasting or atrophy  Neurologic: A&O X 3;  No focal weakness or paresthesias are detected; speech is fluent/normal Psychiatric:  The pt has Normal affect.  CBC    Component Value Date/Time   WBC 8.5 01/17/2023 0056   RBC 3.84 (L) 01/17/2023 0056   HGB 11.7 (L) 01/17/2023 0056   HGB 13.1 12/04/2021 1429   HCT 34.9 (L) 01/17/2023 0056   HCT 37.5 12/04/2021 1429   PLT 212 01/17/2023 0056   PLT 225 12/04/2021 1429   MCV 90.9 01/17/2023 0056   MCV 95 12/04/2021 1429   MCH 30.5 01/17/2023 0056   MCHC 33.5 01/17/2023 0056   RDW 13.8 01/17/2023 0056   RDW 12.3 12/04/2021 1429   LYMPHSABS 2.4 01/15/2023 1111   MONOABS 0.8 01/15/2023 1111   EOSABS 0.1 01/15/2023 1111   BASOSABS 0.1 01/15/2023 1111    BMET    Component Value Date/Time   NA 129 (L) 01/17/2023 0056   NA 137 12/04/2021 1429   K 3.8 01/17/2023 0056   CL 97 (L) 01/17/2023 0056   CO2 22 01/17/2023 0056   GLUCOSE 260 (H) 01/17/2023 0056   BUN 13 01/17/2023 0056   BUN 17 12/04/2021 1429   CREATININE 0.64 01/17/2023 0056   CALCIUM 8.7 (L) 01/17/2023 0056   GFRNONAA >60 01/17/2023 0056   GFRAA >60 04/29/2020 0620    COAGS: Lab Results  Component Value Date   INR 1.1 01/15/2023   INR 1.0 07/19/2022   INR 1.1 02/25/2022     Non-Invasive Vascular Imaging:   +-------+-----------+-----------+--------------------------------+---------  ---+  ABI/TBIToday's ABIToday's TBIPrevious ABI                    Previous  TBI  +-------+-----------+-----------+--------------------------------+---------  ---+  Right 1.38       1.62       No pressures obtained due to DVT               +-------+-----------+-----------+--------------------------------+---------  ---+  Left  1.28       0.60       1.10                                            +-------+-----------+-----------+--------------------------------+---------  ---+  Statin:  Yes.   Beta Blocker:  No. Aspirin:  Yes.   ACEI:  No. ARB:  No. CCB use:  No Other antiplatelets/anticoagulants:  Yes.   Brillinta   ASSESSMENT/PLAN: This is a 72 y.o. male who presented with STEMI s/p PCI found to have bilateral lower extremity wounds. He has mixed venous and and arterial disease. His arterial disease is likely microvascular secondary to his Diabetes. His ABI shows normal triphasic flow with abnormal toe pressures. On examination he has palpable femoral, popliteal and DP pulses bilaterally. He would benefit from Angiogram of BLE to further define his blood flow and chances for wound healing. I discussed the risks/ benefits and alternatives with the patient. However, since he just underwent PCI with DES now on Brillinta would not recommend Angiography for at least 30 days as his Flonnie Overman will need held. - Benefit from light compression and elevation of his legs - Continued local wound care. Wound benefit from referral to wound care center on D/c - Continue Aspirin and Statin  - We will arrange outpatient follow up to check on his BLE wounds and can arrange Angiogram as an outpatient The on call vascular surgeon, Dr. Karin Lieu, will patient later today to provide further recommendations  Graceann Congress PA-C [facility] [phone] 01/17/2023  11:04 AM  VASCULAR STAFF ADDENDUM: I have independently interviewed and examined the patient. I agree with the above.  In short, patient is a 72 year old male who was [facility] to [facility], and worked at [employer] for years prior to retirement. He lives by himself, and does not close with his only daughter. Naresh has recent history of STEMI status post PCI on 4/30.  He has bilateral lower extremity wounds with imaging demonstrating microvascular disease from longstanding, uncontrolled, diabetes.  The wounds have been  present for months bilaterally.  He states the wounds occurred when he began elevating his legs on the bed. On physical exam, he had palpable femoral pulses, 2+ left DP, nonpalpable right  In evaluating his ABI, I am concerned that his wounds are a product of poor hygiene, likely failure to thrive at home, and small vessel disease. Terril would benefit from bilateral lower extremity angiogram with emphasis on the right leg to evaluate lower extremity perfusion, and possibly improve flow for wound healing.  We discussed left sided access, bilateral lower extremity angiogram, possible intervention on the right.   He does not use his left leg, and spends the majority of his day in a wheelchair.  I am unsure of his open surgical candidacy, and plan to discuss his clinical status further with his wife.   After discussing risk and benefits of the above, Kedarius elected to proceed.  I discussed his case with Dr. Myra Gianotti, and have scheduled him for tomorrow.   Please make NPO midnight    Fara Olden, MD [facility] Phone Number: [phone] 01/17/2023 3:53 PM

## 2023-01-17 NOTE — TOC Benefit Eligibility Note (Signed)
Patient Product/process development scientist completed.    The patient is currently admitted and upon discharge could be taking Lantus Pen.  The current 30 day co-pay is $35.00.   The patient is currently admitted and upon discharge could be taking Basaglar Pen.  Product Not on Formulary  The patient is currently admitted and upon discharge could be taking Semglee Pen.  Product Not on Formulary  The patient is insured through [facility] Part D   This test claim was processed through [facility]- copay amounts may vary at other pharmacies due to pharmacy/plan contracts, or as the patient moves through the different stages of their insurance plan.  Roland Earl, CPHT Pharmacy Patient Advocate Specialist [facility] Patient Advocate Team Direct Number: [phone]  Fax: [phone]

## 2023-01-17 NOTE — Progress Notes (Signed)
Rounding Note    Patient Name: Micheal Moore. Date of Encounter: 01/17/2023  [facility] Cardiologist: Dietrich Pates, MD   Subjective   No acute events overnight.  Transferred out of unit.  Inpatient Medications    Scheduled Meds:  aspirin  81 mg Oral Daily   atorvastatin  80 mg Oral Daily   Chlorhexidine Gluconate Cloth  6 each Topical Q0600   enoxaparin (LOVENOX) injection  40 mg Subcutaneous Q24H   heparin  4,000 Units Intravenous Once   insulin aspart  0-5 Units Subcutaneous QHS   insulin aspart  0-9 Units Subcutaneous TID WC   leptospermum manuka honey  1 Application Topical Daily   midodrine  2.5 mg Oral TID WC   mupirocin ointment  1 Application Nasal BID   ondansetron (ZOFRAN) IV  4 mg Intravenous Once   sodium chloride flush  3 mL Intravenous Q12H   ticagrelor  90 mg Oral BID   Continuous Infusions:  sodium chloride     PRN Meds: sodium chloride, acetaminophen, dextrose, docusate sodium, Gerhardt's butt cream, mouth rinse, polyethylene glycol, sodium chloride flush   Vital Signs    Vitals:   01/17/23 0000 01/17/23 0403 01/17/23 0500 01/17/23 0746  BP: 131/69 102/72  (!) 142/43  Pulse: 95 (!) 35 80 95  Resp: 16 20  18   Temp: 98.2 F (36.8 C) 98.2 F (36.8 C)  97.9 F (36.6 C)  TempSrc: Oral Oral  Oral  SpO2: 99% 98%  99%  Weight: 84.2 kg     Height:        Intake/Output Summary (Last 24 hours) at 01/17/2023 0940 Last data filed at 01/17/2023 0747 Gross per 24 hour  Intake 914.81 ml  Output 2000 ml  Net -1085.19 ml      01/17/2023   12:00 AM 01/16/2023    4:40 AM 01/15/2023   11:10 AM  Last 3 Weights  Weight (lbs) 185 lb 10 oz 185 lb 10 oz 191 lb 12.8 oz  Weight (kg) 84.2 kg 84.2 kg 87 kg      Telemetry    SR - Personally Reviewed  ECG    SR with inferior TWI  - Personally Reviewed  Physical Exam   GEN: No acute distress.   Neck: No JVD Cardiac: RRR, no murmurs, rubs, or gallops.  Respiratory: Clear to auscultation  bilaterally. GI: Soft, nontender, non-distended  MS: No edema; No deformity.  R radial site intact Neuro:  Nonfocal  Psych: Normal affect   Labs    High Sensitivity Troponin:   Recent Labs  Lab 01/15/23 1601 01/15/23 1754  TROPONINIHS 2,670* 6,766*     Chemistry Recent Labs  Lab 01/15/23 1111 01/15/23 1214 01/15/23 2044 01/16/23 0557 01/17/23 0056  NA 130*   < > 136 137 129*  K 3.9   < > 4.4 3.4* 3.8  CL 96*   < > 101 101 97*  CO2 15*   < > 19* 24 22  GLUCOSE 622*   < > 162* 176* 260*  BUN 14   < > 13 12 13   CREATININE 1.23   < > 0.75 0.64 0.64  CALCIUM 9.6   < > 9.6 9.2 8.7*  MG  --   --   --  1.7 1.9  PROT 6.7  --   --   --   --   ALBUMIN 3.2*  --   --   --   --   AST 20  --   --   --   --  ALT 11  --   --   --   --   ALKPHOS 103  --   --   --   --   BILITOT 1.1  --   --   --   --   GFRNONAA >60   < > >60 >60 >60  ANIONGAP 19*   < > 16* 12 10   < > = values in this interval not displayed.    Lipids  Recent Labs  Lab 01/16/23 0557  CHOL 146  TRIG 68  HDL 38*  LDLCALC 94  CHOLHDL 3.8    Hematology Recent Labs  Lab 01/15/23 1111 01/15/23 1214 01/16/23 0557 01/17/23 0056  WBC 15.1*  --  9.6 8.5  RBC 4.32  --  3.70* 3.84*  HGB 13.2 12.2* 11.1* 11.7*  HCT 40.6 36.0* 34.2* 34.9*  MCV 94.0  --  92.4 90.9  MCH 30.6  --  30.0 30.5  MCHC 32.5  --  32.5 33.5  RDW 13.3  --  13.6 13.8  PLT 279  --  223 212   Thyroid No results for input(s): "TSH", "FREET4" in the last 168 hours.  BNPNo results for input(s): "BNP", "PROBNP" in the last 168 hours.  DDimer No results for input(s): "DDIMER" in the last 168 hours.   Radiology    VAS [place] ABI WITH/WO TBI  Result Date: 01/16/2023  LOWER EXTREMITY DOPPLER STUDY Patient Name:  Micheal Moore.  Date of Exam:   01/16/2023 Medical Rec #: [id number]                  Accession #:    [id number] Date of Birth: July 05, 1951                  Patient Gender: M Patient Age:   72 years Exam Location:  [facility]  Procedure:      VAS [place] ABI WITH/WO TBI Referring Phys: CALLIE GOODRICH --------------------------------------------------------------------------------  Indications: Ulceration. Left great toe amputation High Risk Factors: Diabetes, past history of smoking.  Comparison Study: Previous study 04/28/20. Performing Technologist: McKayla Maag RVT, [place]  Examination Guidelines: A complete evaluation includes at minimum, Doppler waveform signals and systolic blood pressure reading at the level of bilateral brachial, anterior tibial, and posterior tibial arteries, when vessel segments are accessible. Bilateral testing is considered an integral part of a complete examination. Photoelectric Plethysmograph (PPG) waveforms and toe systolic pressure readings are included as required and additional duplex testing as needed. Limited examinations for reoccurring indications may be performed as noted.  ABI Findings: +---------+------------------+-----+---------+--------+ Right    Rt Pressure (mmHg)IndexWaveform Comment  +---------+------------------+-----+---------+--------+ Brachial 141                    triphasic         +---------+------------------+-----+---------+--------+ PTA      192               1.36 triphasic         +---------+------------------+-----+---------+--------+ DP       195               1.38 triphasic         +---------+------------------+-----+---------+--------+ Great Toe229               1.62 Normal            +---------+------------------+-----+---------+--------+ +---------+------------------+-----+---------+---------------------------------+ Left     Lt Pressure (mmHg)IndexWaveform Comment                           +---------+------------------+-----+---------+---------------------------------+  Brachial 127                    triphasic                                  +---------+------------------+-----+---------+---------------------------------+ PTA      180                1.28 triphasic                                  +---------+------------------+-----+---------+---------------------------------+ DP       181               1.28 triphasic                                  +---------+------------------+-----+---------+---------------------------------+ Great Toe84                0.60 Abnormal Obtained on second toe due to                                              great toe amputation.             +---------+------------------+-----+---------+---------------------------------+ +-------+-----------+-----------+--------------------------------+------------+ ABI/TBIToday's ABIToday's TBIPrevious ABI                    Previous TBI +-------+-----------+-----------+--------------------------------+------------+ Right  1.38       1.62       No pressures obtained due to DVT             +-------+-----------+-----------+--------------------------------+------------+ Left   1.28       0.60       1.10                                         +-------+-----------+-----------+--------------------------------+------------+  Summary: Right: Resting right ankle-brachial index is within normal range. The right toe-brachial index is normal. Left: Resting left ankle-brachial index is within normal range. The left toe-brachial index is abnormal. *See table(s) above for measurements and observations.  Electronically signed by Coral Else MD on 01/16/2023 at 10:14:23 PM.    Final    ECHOCARDIOGRAM COMPLETE  Result Date: 01/16/2023    ECHOCARDIOGRAM REPORT   Patient Name:   Renold Kozar. Date of Exam: 01/16/2023 Medical Rec #:  [id number]                 Height:       77.0 in Accession #:    [id number]                Weight:       185.6 lb Date of Birth:  1951/07/12                 BSA:          2.167 m Patient Age:    72 years                  BP:           135/73 mmHg Patient Gender: M  HR:           81 bpm. Exam  Location:  Inpatient Procedure: 2D Echo, Cardiac Doppler, Color Doppler and Intracardiac            Opacification Agent Indications:    122-I22.9 Subsequent ST elevation (STEM) and non-ST elevation                 (NSTEMI) myocardial infarction  History:        Patient has prior history of Echocardiogram examinations, most                 recent 12/19/2021. Acute MI and CAD, Signs/Symptoms:Hypotension                 and Syncope; Risk Factors:Diabetes and Former Smoker.  Sonographer:    Sheralyn Boatman RDCS Referring Phys: [id number] Charlies Constable [facility]  Sonographer Comments: Technically difficult study due to poor echo windows and suboptimal parasternal window. IMPRESSIONS  1. Left ventricular ejection fraction, by estimation, is 50 to 55%. The left ventricle has low normal function. The left ventricle has no regional wall motion abnormalities. There is mild concentric left ventricular hypertrophy. Left ventricular diastolic parameters are consistent with Grade I diastolic dysfunction (impaired relaxation).  2. Right ventricular systolic function is normal. The right ventricular size is normal.  3. The mitral valve is degenerative. No evidence of mitral valve regurgitation.  4. The aortic valve was not well visualized. Aortic valve regurgitation is not visualized. No aortic stenosis is present.  5. The inferior vena cava is normal in size with greater than 50% respiratory variability, suggesting right atrial pressure of 3 mmHg. Comparison(s): No significant change from prior study. FINDINGS  Left Ventricle: Left ventricular ejection fraction, by estimation, is 50 to 55%. The left ventricle has low normal function. The left ventricle has no regional wall motion abnormalities. Definity contrast agent was given IV to delineate the left ventricular endocardial borders. The left ventricular internal cavity size was normal in size. There is mild concentric left ventricular hypertrophy. Left ventricular diastolic parameters are  consistent with Grade I diastolic dysfunction (impaired relaxation). Right Ventricle: The right ventricular size is normal. No increase in right ventricular wall thickness. Right ventricular systolic function is normal. Left Atrium: Left atrial size was normal in size. Right Atrium: Right atrial size was normal in size. Pericardium: There is no evidence of pericardial effusion. Mitral Valve: Calcified leaflet tip. The mitral valve is degenerative in appearance. No evidence of mitral valve regurgitation. Tricuspid Valve: The tricuspid valve is normal in structure. Tricuspid valve regurgitation is not demonstrated. Aortic Valve: The aortic valve was not well visualized. Aortic valve regurgitation is not visualized. No aortic stenosis is present. Pulmonic Valve: The pulmonic valve was not well visualized. Pulmonic valve regurgitation is not visualized. Aorta: The aortic root is normal in size and structure. Venous: The inferior vena cava is normal in size with greater than 50% respiratory variability, suggesting right atrial pressure of 3 mmHg. IAS/Shunts: The atrial septum is grossly normal.  LEFT VENTRICLE PLAX 2D LVIDd:         4.70 cm     Diastology LVIDs:         3.50 cm     LV e' medial:    4.13 cm/s LV PW:         1.10 cm     LV E/e' medial:  14.1 LV IVS:        1.20 cm     LV e' lateral:  7.51 cm/s LVOT diam:     2.50 cm     LV E/e' lateral: 7.8 LV SV:         81 LV SV Index:   38 LVOT Area:     4.91 cm  LV Volumes (MOD) LV vol d, MOD A2C: 75.7 ml LV vol d, MOD A4C: 77.4 ml LV vol s, MOD A2C: 28.9 ml LV vol s, MOD A4C: 36.9 ml LV SV MOD A2C:     46.8 ml LV SV MOD A4C:     77.4 ml LV SV MOD BP:      47.6 ml RIGHT VENTRICLE             IVC RV S prime:     10.40 cm/s  IVC diam: 1.70 cm TAPSE (M-mode): 1.4 cm LEFT ATRIUM           Index        RIGHT ATRIUM           Index LA diam:      3.00 cm 1.38 cm/m   RA Area:     16.80 cm LA Vol (A2C): 21.8 ml 10.06 ml/m  RA Volume:   41.90 ml  19.34 ml/m LA Vol (A4C):  25.0 ml 11.54 ml/m  AORTIC VALVE LVOT Vmax:   85.50 cm/s LVOT Vmean:  54.400 cm/s LVOT VTI:    0.166 m  AORTA Ao Root diam: 3.70 cm MITRAL VALVE MV Area (PHT): 3.53 cm    SHUNTS MV Decel Time: 215 msec    Systemic VTI:  0.17 m MV E velocity: 58.30 cm/s  Systemic Diam: 2.50 cm MV A velocity: 70.70 cm/s MV E/A ratio:  0.82 Riley Lam MD Electronically signed by Riley Lam MD Signature Date/Time: 01/16/2023/11:53:08 AM    Final    CARDIAC CATHETERIZATION  Result Date: 01/15/2023   Prox RCA lesion is 80% stenosed.   Prox LAD lesion is 40% stenosed.   A stent was successfully placed.   Post intervention, there is a 0% residual stenosis. 1.  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 2.  LVEDP of 2 mmHg; the patient received a 250 cc normal saline bolus. Recommendation: Dual antiplatelet therapy for at least 1 year, echocardiogram, and ICU management of hyperglycemia.  The results were reviewed with the patient's spouse and Dr. Michae Kava.    Cardiac Studies   4/30 PCI 1.  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 2.  LVEDP of 2 mmHg; the patient received a 250 cc normal saline bolus.  5/1 TTE  1. Left ventricular ejection fraction, by estimation, is 50 to 55%. The  left ventricle has low normal function. The left ventricle has no regional  wall motion abnormalities. There is mild concentric left ventricular  hypertrophy. Left ventricular  diastolic parameters are consistent with Grade I diastolic dysfunction  (impaired relaxation).   2. Right ventricular systolic function is normal. The right ventricular  size is normal.   3. The mitral valve is degenerative. No evidence of mitral valve  regurgitation.   4. The aortic valve was not well visualized. Aortic valve regurgitation  is not visualized. No aortic stenosis is  present.   5. The inferior vena cava is normal in size with greater than 50%  respiratory variability, suggesting right atrial pressure of 3 mmHg.   5/1 ABI Summary:  Right: Resting right ankle-brachial index is  within normal range. The  right toe-brachial index is normal.   Left: Resting left ankle-brachial index is within normal range. The left  toe-brachial index is abnormal.   Patient Profile     72 y.o. male with a history of syncope felt to be secondary to orthostasis on Midodrine, type 2 diabetes mellitus, s/p left great toe amputation in 04/2020 in setting of osteomyelitis, remote DVT no longer on anticoagulation, and prior tobacco abuse who is being seen 01/15/2023 for the evaluation of STEMI.   Assessment & Plan     Inferior STEMI:  Cont DAPT x 1 year.  Cont atorvastatin 80mg .  TTE with normal function; no need for BB.  Will arrange cardiology follow up.   HL:  High dose atorvastatin  DKA/T2DM:  Per hospital medicine.  Not a candidate for SGLTi given HgbA1C ~ 13.  Cont atorvastatin and ASA for now. PVD:  ABIs on left abnormal with poor tissue healing, strongly recommend vascular surgery consult for continuity of care    Will sign off, call with ?s.  Will arrange outpatient cardiology follow up.  For questions or updates, please contact [facility] Please consult [email or web address] for contact info under        Signed, Orbie Pyo, MD  01/17/2023, 9:40 AM

## 2023-01-17 NOTE — Telephone Encounter (Signed)
Pharmacy Patient Advocate Encounter  Insurance verification completed.    The patient is insured through [employer]   The patient is currently admitted and ran test claims for the following: Lantus, Basaglar, Semglee.  Copays and coinsurance results were relayed to Inpatient clinical team.

## 2023-01-17 NOTE — Plan of Care (Signed)
Problem: Education: Goal: Knowledge of General Education information will improve Description: Including pain rating scale, medication(s)/side effects and non-pharmacologic comfort measures Outcome: Progressing   Problem: Health Behavior/Discharge Planning: Goal: Ability to manage health-related needs will improve Outcome: Progressing   Problem: Clinical Measurements: Goal: Ability to maintain clinical measurements within normal limits will improve Outcome: Progressing Goal: Will remain free from infection Outcome: Progressing Goal: Diagnostic test results will improve Outcome: Progressing Goal: Respiratory complications will improve Outcome: Progressing Goal: Cardiovascular complication will be avoided Outcome: Progressing   Problem: Activity: Goal: Risk for activity intolerance will decrease Outcome: Progressing   Problem: Nutrition: Goal: Adequate nutrition will be maintained Outcome: Progressing   Problem: Coping: Goal: Level of anxiety will decrease Outcome: Progressing   Problem: Elimination: Goal: Will not experience complications related to bowel motility Outcome: Progressing Goal: Will not experience complications related to urinary retention Outcome: Progressing   Problem: Pain Managment: Goal: General experience of comfort will improve Outcome: Progressing   Problem: Safety: Goal: Ability to remain free from injury will improve Outcome: Progressing   Problem: Skin Integrity: Goal: Risk for impaired skin integrity will decrease Outcome: Progressing   Problem: Education: Goal: Understanding of CV disease, CV risk reduction, and recovery process will improve Outcome: Progressing Goal: Individualized Educational Video(s) Outcome: Progressing   Problem: Activity: Goal: Ability to return to baseline activity level will improve Outcome: Progressing   Problem: Cardiovascular: Goal: Ability to achieve and maintain adequate cardiovascular perfusion  will improve Outcome: Progressing Goal: Vascular access site(s) Level 0-1 will be maintained Outcome: Progressing   Problem: Health Behavior/Discharge Planning: Goal: Ability to safely manage health-related needs after discharge will improve Outcome: Progressing   Problem: Education: Goal: Ability to describe self-care measures that may prevent or decrease complications (Diabetes Survival Skills Education) will improve Outcome: Progressing Goal: Individualized Educational Video(s) Outcome: Progressing   Problem: Coping: Goal: Ability to adjust to condition or change in health will improve Outcome: Progressing   Problem: Fluid Volume: Goal: Ability to maintain a balanced intake and output will improve Outcome: Progressing   Problem: Health Behavior/Discharge Planning: Goal: Ability to identify and utilize available resources and services will improve Outcome: Progressing Goal: Ability to manage health-related needs will improve Outcome: Progressing   Problem: Metabolic: Goal: Ability to maintain appropriate glucose levels will improve Outcome: Progressing   Problem: Nutritional: Goal: Maintenance of adequate nutrition will improve Outcome: Progressing Goal: Progress toward achieving an optimal weight will improve Outcome: Progressing   Problem: Skin Integrity: Goal: Risk for impaired skin integrity will decrease Outcome: Progressing   Problem: Tissue Perfusion: Goal: Adequacy of tissue perfusion will improve Outcome: Progressing   Problem: Education: Goal: Ability to describe self-care measures that may prevent or decrease complications (Diabetes Survival Skills Education) will improve Outcome: Progressing Goal: Individualized Educational Video(s) Outcome: Progressing   Problem: Cardiac: Goal: Ability to maintain an adequate cardiac output will improve Outcome: Progressing   Problem: Health Behavior/Discharge Planning: Goal: Ability to identify and utilize  available resources and services will improve Outcome: Progressing Goal: Ability to manage health-related needs will improve Outcome: Progressing   Problem: Fluid Volume: Goal: Ability to achieve a balanced intake and output will improve Outcome: Progressing   Problem: Metabolic: Goal: Ability to maintain appropriate glucose levels will improve Outcome: Progressing   Problem: Education: Goal: Understanding of CV disease, CV risk reduction, and recovery process will improve Outcome: Progressing Goal: Individualized Educational Video(s) Outcome: Progressing   Problem: Activity: Goal: Ability to return to baseline activity level will improve Outcome: Progressing  Problem: Cardiovascular: Goal: Ability to achieve and maintain adequate cardiovascular perfusion will improve Outcome: Progressing Goal: Vascular access site(s) Level 0-1 will be maintained Outcome: Progressing   Problem: Health Behavior/Discharge Planning: Goal: Ability to safely manage health-related needs after discharge will improve Outcome: Progressing   

## 2023-01-18 ENCOUNTER — Encounter (HOSPITAL_COMMUNITY)
Admission: EM | Disposition: A | Discharge status: Home Health | Payer: Self-pay | Source: Home / Self Care | Attending: Internal Medicine

## 2023-01-18 DIAGNOSIS — I70245 Atherosclerosis of native arteries of left leg with ulceration of other part of foot: Secondary | ICD-10-CM

## 2023-01-18 DIAGNOSIS — I70232 Atherosclerosis of native arteries of right leg with ulceration of calf: Secondary | ICD-10-CM | POA: Diagnosis not present

## 2023-01-18 DIAGNOSIS — I2111 ST elevation (STEMI) myocardial infarction involving right coronary artery: Secondary | ICD-10-CM | POA: Diagnosis not present

## 2023-01-18 HISTORY — PX: LOWER EXTREMITY ANGIOGRAPHY: CATH118251

## 2023-01-18 LAB — CBC
HCT: 33.1 % — ABNORMAL LOW (ref 39.0–52.0)
Hemoglobin: 10.8 g/dL — ABNORMAL LOW (ref 13.0–17.0)
MCH: 30.3 pg (ref 26.0–34.0)
MCHC: 32.6 g/dL (ref 30.0–36.0)
MCV: 93 fL (ref 80.0–100.0)
Platelets: 225 10*3/uL (ref 150–400)
RBC: 3.56 MIL/uL — ABNORMAL LOW (ref 4.22–5.81)
RDW: 13.6 % (ref 11.5–15.5)
WBC: 6.6 10*3/uL (ref 4.0–10.5)
nRBC: 0 % (ref 0.0–0.2)

## 2023-01-18 LAB — GLUCOSE, CAPILLARY
Glucose-Capillary: 330 mg/dL — ABNORMAL HIGH (ref 70–99)
Glucose-Capillary: 331 mg/dL — ABNORMAL HIGH (ref 70–99)
Glucose-Capillary: 387 mg/dL — ABNORMAL HIGH (ref 70–99)
Glucose-Capillary: 174 mg/dL — ABNORMAL HIGH (ref 70–99)
Glucose-Capillary: 174 mg/dL — ABNORMAL HIGH (ref 70–99)
Glucose-Capillary: 280 mg/dL — ABNORMAL HIGH (ref 70–99)

## 2023-01-18 LAB — BASIC METABOLIC PANEL
Anion gap: 6 (ref 5–15)
BUN: 15 mg/dL (ref 8–23)
CO2: 26 mmol/L (ref 22–32)
Calcium: 8.5 mg/dL — ABNORMAL LOW (ref 8.9–10.3)
Chloride: 100 mmol/L (ref 98–111)
Creatinine, Ser: 0.88 mg/dL (ref 0.61–1.24)
GFR, Estimated: >60 mL/min (ref >60)
Glucose, Bld: 375 mg/dL — ABNORMAL HIGH (ref 70–99)
Potassium: 4.3 mmol/L (ref 3.5–5.1)
Sodium: 132 mmol/L — ABNORMAL LOW (ref 135–145)

## 2023-01-18 LAB — LIPOPROTEIN A (LPA): Lipoprotein (a): 72.7 nmol/L — ABNORMAL HIGH (ref <75.0)

## 2023-01-18 SURGERY — LOWER EXTREMITY ANGIOGRAPHY
Anesthesia: LOCAL | Laterality: Bilateral

## 2023-01-18 MED ORDER — HEPARIN (PORCINE) IN NACL 1000-0.9 UT/500ML-% IV SOLN
INTRAVENOUS | Status: DC | PRN
  Administered 2023-01-18 (×2): 500 mL

## 2023-01-18 MED ORDER — LIDOCAINE HCL (PF) 1 % IJ SOLN
INTRAMUSCULAR | Status: DC | PRN
  Administered 2023-01-18: 10 mL

## 2023-01-18 MED ORDER — IODIXANOL 320 MG/ML IV SOLN
INTRAVENOUS | Status: DC | PRN
  Administered 2023-01-18: 86 mL via INTRA_ARTERIAL

## 2023-01-18 MED ORDER — INSULIN ASPART 100 UNIT/ML IJ SOLN
5.0000 [IU] | Freq: Three times a day (TID) | INTRAMUSCULAR | Status: DC
  Administered 2023-01-18 – 2023-01-19 (×3): 5 [IU] via SUBCUTANEOUS

## 2023-01-18 MED ORDER — LABETALOL HCL 5 MG/ML IV SOLN: 10.0000 mg | INTRAVENOUS | Status: DC | PRN

## 2023-01-18 MED ORDER — SODIUM CHLORIDE 0.9% FLUSH: 3.0000 mL | INTRAVENOUS | Status: DC | PRN

## 2023-01-18 MED ORDER — INSULIN GLARGINE-YFGN 100 UNIT/ML ~~LOC~~ SOLN
35.0000 [IU] | Freq: Every day | SUBCUTANEOUS | Status: DC
  Administered 2023-01-18 – 2023-01-19 (×2): 35 [IU] via SUBCUTANEOUS
  Filled 2023-01-18 (×3): qty 0.35

## 2023-01-18 MED ORDER — SODIUM CHLORIDE 0.9 % WEIGHT BASED INFUSION
1.0000 mL/kg/h | INTRAVENOUS | Status: DC
  Administered 2023-01-18: 1 mL/kg/h via INTRAVENOUS

## 2023-01-18 MED ORDER — ONDANSETRON HCL 4 MG/2ML IJ SOLN: 4.0000 mg | Freq: Four times a day (QID) | INTRAMUSCULAR | Status: DC | PRN

## 2023-01-18 MED ORDER — FENTANYL CITRATE (PF) 100 MCG/2ML IJ SOLN
INTRAMUSCULAR | Status: AC
  Filled 2023-01-18: qty 2

## 2023-01-18 MED ORDER — ACETAMINOPHEN 325 MG PO TABS: 650.0000 mg | ORAL_TABLET | ORAL | Status: DC | PRN

## 2023-01-18 MED ORDER — SODIUM CHLORIDE 0.9 % IV SOLN: INTRAVENOUS | Status: DC

## 2023-01-18 MED ORDER — LIDOCAINE HCL (PF) 1 % IJ SOLN
INTRAMUSCULAR | Status: AC
  Filled 2023-01-18: qty 30

## 2023-01-18 MED ORDER — HYDRALAZINE HCL 20 MG/ML IJ SOLN: 5.0000 mg | INTRAMUSCULAR | Status: DC | PRN

## 2023-01-18 MED ORDER — MORPHINE SULFATE (PF) 2 MG/ML IV SOLN: 2.0000 mg | INTRAVENOUS | Status: DC | PRN

## 2023-01-18 MED ORDER — MIDAZOLAM HCL 2 MG/2ML IJ SOLN
INTRAMUSCULAR | Status: AC
  Filled 2023-01-18: qty 2

## 2023-01-18 MED ORDER — FENTANYL CITRATE (PF) 100 MCG/2ML IJ SOLN
INTRAMUSCULAR | Status: DC | PRN
  Administered 2023-01-18: 50 ug via INTRAVENOUS

## 2023-01-18 MED ORDER — SODIUM CHLORIDE 0.9 % IV SOLN: 250.0000 mL | INTRAVENOUS | Status: DC | PRN

## 2023-01-18 MED ORDER — SODIUM CHLORIDE 0.9% FLUSH
3.0000 mL | Freq: Two times a day (BID) | INTRAVENOUS | Status: DC
  Administered 2023-01-18: 3 mL via INTRAVENOUS

## 2023-01-18 MED ORDER — MIDAZOLAM HCL 2 MG/2ML IJ SOLN
INTRAMUSCULAR | Status: DC | PRN
  Administered 2023-01-18: 2 mg via INTRAVENOUS

## 2023-01-18 SURGICAL SUPPLY — 10 items
CATH OMNI FLUSH 5F 65CM (CATHETERS) IMPLANT
DEVICE VASC CLSR CELT ART 5 (Vascular Products) IMPLANT
KIT MICROPUNCTURE NIT STIFF (SHEATH) IMPLANT
KIT PV (KITS) ×1 IMPLANT
SHEATH PINNACLE 5F 10CM (SHEATH) IMPLANT
STOPCOCK MORSE 400PSI 3WAY (MISCELLANEOUS) IMPLANT
SYR MEDRAD MARK 7 150ML (SYRINGE) ×1 IMPLANT
TRANSDUCER W/STOPCOCK (MISCELLANEOUS) ×1 IMPLANT
TRAY PV CATH (CUSTOM PROCEDURE TRAY) ×1 IMPLANT
WIRE STARTER BENTSON 035X150 (WIRE) IMPLANT

## 2023-01-18 NOTE — Plan of Care (Signed)
Problem: Education: Goal: Knowledge of General Education information will improve Description: Including pain rating scale, medication(s)/side effects and non-pharmacologic comfort measures Outcome: Progressing   Problem: Health Behavior/Discharge Planning: Goal: Ability to manage health-related needs will improve Outcome: Progressing   Problem: Clinical Measurements: Goal: Ability to maintain clinical measurements within normal limits will improve Outcome: Progressing Goal: Will remain free from infection Outcome: Progressing Goal: Diagnostic test results will improve Outcome: Progressing Goal: Respiratory complications will improve Outcome: Progressing Goal: Cardiovascular complication will be avoided Outcome: Progressing   Problem: Activity: Goal: Risk for activity intolerance will decrease Outcome: Progressing   Problem: Nutrition: Goal: Adequate nutrition will be maintained Outcome: Progressing   Problem: Coping: Goal: Level of anxiety will decrease Outcome: Progressing   Problem: Elimination: Goal: Will not experience complications related to bowel motility Outcome: Progressing Goal: Will not experience complications related to urinary retention Outcome: Progressing   Problem: Pain Managment: Goal: General experience of comfort will improve Outcome: Progressing   Problem: Safety: Goal: Ability to remain free from injury will improve Outcome: Progressing   Problem: Skin Integrity: Goal: Risk for impaired skin integrity will decrease Outcome: Progressing   Problem: Education: Goal: Understanding of CV disease, CV risk reduction, and recovery process will improve Outcome: Progressing Goal: Individualized Educational Video(s) Outcome: Progressing   Problem: Activity: Goal: Ability to return to baseline activity level will improve Outcome: Progressing   Problem: Cardiovascular: Goal: Ability to achieve and maintain adequate cardiovascular perfusion  will improve Outcome: Progressing Goal: Vascular access site(s) Level 0-1 will be maintained Outcome: Progressing   Problem: Health Behavior/Discharge Planning: Goal: Ability to safely manage health-related needs after discharge will improve Outcome: Progressing   Problem: Education: Goal: Ability to describe self-care measures that may prevent or decrease complications (Diabetes Survival Skills Education) will improve Outcome: Progressing Goal: Individualized Educational Video(s) Outcome: Progressing   Problem: Coping: Goal: Ability to adjust to condition or change in health will improve Outcome: Progressing   Problem: Fluid Volume: Goal: Ability to maintain a balanced intake and output will improve Outcome: Progressing   Problem: Health Behavior/Discharge Planning: Goal: Ability to identify and utilize available resources and services will improve Outcome: Progressing Goal: Ability to manage health-related needs will improve Outcome: Progressing   Problem: Metabolic: Goal: Ability to maintain appropriate glucose levels will improve Outcome: Progressing   Problem: Nutritional: Goal: Maintenance of adequate nutrition will improve Outcome: Progressing Goal: Progress toward achieving an optimal weight will improve Outcome: Progressing   Problem: Skin Integrity: Goal: Risk for impaired skin integrity will decrease Outcome: Progressing   Problem: Tissue Perfusion: Goal: Adequacy of tissue perfusion will improve Outcome: Progressing   Problem: Education: Goal: Ability to describe self-care measures that may prevent or decrease complications (Diabetes Survival Skills Education) will improve Outcome: Progressing Goal: Individualized Educational Video(s) Outcome: Progressing   Problem: Cardiac: Goal: Ability to maintain an adequate cardiac output will improve Outcome: Progressing   Problem: Health Behavior/Discharge Planning: Goal: Ability to identify and utilize  available resources and services will improve Outcome: Progressing Goal: Ability to manage health-related needs will improve Outcome: Progressing   Problem: Fluid Volume: Goal: Ability to achieve a balanced intake and output will improve Outcome: Progressing   Problem: Metabolic: Goal: Ability to maintain appropriate glucose levels will improve Outcome: Progressing   Problem: Education: Goal: Understanding of CV disease, CV risk reduction, and recovery process will improve Outcome: Progressing Goal: Individualized Educational Video(s) Outcome: Progressing   Problem: Activity: Goal: Ability to return to baseline activity level will improve Outcome: Progressing     Problem: Cardiovascular: Goal: Ability to achieve and maintain adequate cardiovascular perfusion will improve Outcome: Progressing Goal: Vascular access site(s) Level 0-1 will be maintained Outcome: Progressing   Problem: Health Behavior/Discharge Planning: Goal: Ability to safely manage health-related needs after discharge will improve Outcome: Progressing   

## 2023-01-18 NOTE — Op Note (Signed)
    Patient name: Micheal Moore. MRN: [id number] DOB: [birth year] Sex: male  01/18/2023 Pre-operative Diagnosis: Bilateral lower extremity ulcers Post-operative diagnosis:  Same Surgeon:  Durene Cal Procedure Performed:  1.  Ultrasound-guided access, left femoral artery  2.  Abdominal aortogram  3.  Bilateral lower extremity runoff  4.  Second-order catheterization  5.  Conscious sedation, 32 minutes  6.  Closure device, Celt    Indications: This is a 72 year old gentleman with bilateral lower extremity nonhealing wounds.  He comes in today for angiographic evaluation  Procedure:  The patient was identified in the holding area and taken to room 8.  The patient was then placed supine on the table and prepped and draped in the usual sterile fashion.  A time out was called.  Conscious sedation was administered with the use of IV fentanyl and Versed under continuous physician and nurse monitoring.  Heart rate, blood pressure, and oxygen saturation were continuously monitored.  Total sedation time was 36 minutes.  Ultrasound was used to evaluate the left common femoral artery.  It was patent .  A digital ultrasound image was acquired.  A micropuncture needle was used to access the left common femoral artery under ultrasound guidance.  An 018 wire was advanced without resistance and a micropuncture sheath was placed.  The 018 wire was removed and a benson wire was placed.  The micropuncture sheath was exchanged for a 5 french sheath.  An omniflush catheter was advanced over the wire to the level of L-1.  An abdominal angiogram was obtained.  Next, using the omniflush catheter and a benson wire, the aortic bifurcation was crossed and the catheter was placed into theright external iliac artery and right runoff was obtained.  left runoff was performed via retrograde sheath injections.  Findings:   Aortogram: No significant renal artery stenosis was visualized.  The infrarenal abdominal aorta  is widely patent.  Bilateral common and external iliac arteries are widely patent.  Right Lower Extremity: The right common femoral, profundofemoral, superficial femoral, and popliteal artery are widely patent.  There is maybe 20% stenosis within the proximal popliteal artery.  There is two-vessel runoff via the peroneal and posterior tibial artery.  The peroneal artery reconstitutes the anterior tibial artery at the ankle.  The pedal arch is intact with good digital perfusion.  The anterior tibial artery is atretic  Left Lower Extremity: The left common femoral, profundofemoral, superficial femoral, and popliteal artery are widely patent.  There is three-vessel runoff to the ankle.  There is a 60% stenosis within the proximal third of the anterior tibial artery.  There is good digital artery opacification.  The pedal arch is not intact but there is good collateralization to the toes.  Intervention: The groin was closed with a Celt and was hemostatic  Impression:  #1  No significant aortoiliac occlusive disease  #2  No significant femoral-popliteal occlusive disease bilaterally  #3  Atretic right anterior tibial artery however the peroneal artery collateralizes at the ankle and the pedal arch is intact with good digital perfusion  #4  Three-vessel runoff on the left leg with a 60% stenosis in the proximal third of the anterior tibial artery.  The pedal arch is not intact but there is good digital artery perfusion  #5  Patient should have adequate blood flow for wound healing.  No intervention is recommended   V. Durene Cal, M.D., [facility] [place] Office: [phone] Pager:  [phone]

## 2023-01-18 NOTE — Care Management Important Message (Signed)
Important Message  Patient Details  Name: Micheal Moore. MRN: [id number] Date of Birth: [birth year]   Medicare Important Message Given:  Yes     Kelsey Edman 01/18/2023, 3:26 PM

## 2023-01-18 NOTE — Progress Notes (Signed)
PROGRESS NOTE Micheal Moore.  NFA:[id number] DOB: 07-21-1951 DOA: 01/15/2023 PCP: [name], MD  Brief Narrative/Hospital Course: 72 year old male with pertinent PMH DMT2, Chronic HFpEF, syncope secondary to orthostasis, previous DVT no longer on [facility], tobacco abuse quit 8 months ago presents to [facility] ED on 4/30 for STEMI. He went to PCP and CBG found to be 500s so was sent to [facility] ED for further eval.  On arrival patient EKG showing ST elevations in inferior leads.  Patient also complaining of some dizziness on arrival patient hypotensive).  Patient denied any chest pain or SOB.  Patient was given heparin, aspirin, Zofran.  Cardiology consulted and patient transferred to Cath Lab.  Cath showing Prox RCA 80% stenosed and Prox LAD 40% stenosed. Stent placed and patient taken to [facility] ICU.  Concern for possible DKA.  PCCM consulted for medical management s/p IV fluids and insulin drip started and transitioned off insulin drip to sq insulin 01/16/23  and transferred out of ICU to [facility] 5/2 At baseline he cannot walk due to his hip fracture and surgery on left. He is comfortable with insulin but has not learned yet. He had abnormal left toe-brachial index on left reviewed by Dr. 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   Seen by vascular and on 01/18/23 underwent aortogram with bilateral lower extremity runoff> no significant aortoiliac occlusive disease/femoral-popliteal occlusive disease>no significant renal artery stenosis patent infrarenal abdominal aorta and bilateral common and external iliac artery, 20% stenosis within the proximal popliteal artery pedal arch is intact with good distal perfusion on right, 60% stenosis within the proximal part of the anterior tibial artery on left -per vascular he should have adequate flow for wound healing and no intervention recommended   Subjective: Seen and examined this morning underwent aortogram no  intervention needed  Blood sugar remains poorly controlled, still not comfortable with insulin injection   Assessment and Plan: Principal Problem:   STEMI (ST elevation myocardial infarction) (HCC) Active Problems:   Diabetic ketoacidosis without coma associated with type 2 diabetes mellitus (HCC)   Hyperglycemia   Inferior STEMI: S/P cardiac cath and stent placement.  Continue DAPT x 1 year, atorvastatin 80 mg, TTE with normal function no need for beta-blocker, follow-up with cardiology as outpatient  HLD continue high intensity atorvastatin  DKA with uncontrolled diabetes mellitus: Blood sugar remains poorly controlled, increased Semglee bedtime further, added Premeal insulin-patient is agreeable, continue to educate on insulin teaching along with hypoglycemia hyperglycemia and diabetes teaching. hbA1c at 13. Recent Labs  Lab 01/15/23 1601 01/15/23 1623 01/17/23 1608 01/17/23 2117 01/18/23 0008 01/18/23 0404 01/18/23 0622  GLUCAP  --    < > 287* 344* 331* 330* 387*  HGBA1C 13.3*  --   --   --   --   --   --    < > = values in this interval not displayed.    PVD with left abnormal ABI toe brachial index- s/p aortogram with bilateral runoff no intervention needed   Deconditioning/debility/frailty:patient reports he has not been ambulatory much> skilled nursing facility has been recommended   Orthostatic hypotension continue midodrine. Florinef on hold BP doing well  Hyponatremia/pseudohyponatremia due to hyperglycemia and also component of dehydration from DKA . Na at 132 Leukocytosis but afebrile -resolved Hypokalemia/hypomagnesemia resolved as below Recent Labs  Lab 01/15/23 1754 01/15/23 2044 01/16/23 0557 01/17/23 0056 01/18/23 0044  K 3.9 4.4 3.4* 3.8 4.3  CALCIUM 9.6 9.6 9.2 8.7* 8.5*  MG  --   --  1.7 1.9  --    Pressure injury POA on right buttock stage II, healed right and left, foot as below Pressure Injury 01/15/23 Buttocks Right Stage 2 -  Partial thickness  loss of dermis presenting as a shallow open injury with a red, pink wound bed without slough. (Active)  01/15/23   Location: Buttocks  Location Orientation: Right  Staging: Stage 2 -  Partial thickness loss of dermis presenting as a shallow open injury with a red, pink wound bed without slough.  Wound Description (Comments):   Present on Admission: Yes  Dressing Type Foam - Lift dressing to assess site every shift 01/16/23 0800     Pressure Injury 01/15/23 Heel Right;Left Stage 2 -  Partial thickness loss of dermis presenting as a shallow open injury with a red, pink wound bed without slough. dry scabbed healing pressure injuries to bilat heels (Active)  01/15/23   Location: Heel  Location Orientation: Right;Left  Staging: Stage 2 -  Partial thickness loss of dermis presenting as a shallow open injury with a red, pink wound bed without slough.  Wound Description (Comments): dry scabbed healing pressure injuries to bilat heels  Present on Admission: Yes     Pressure Injury 01/15/23 Foot Left;Anterior Deep Tissue Pressure Injury - Purple or maroon localized area of discolored intact skin or blood-filled blister due to damage of underlying soft tissue from pressure and/or shear. (Active)  01/15/23   Location: Foot  Location Orientation: Left;Anterior  Staging: Deep Tissue Pressure Injury - Purple or maroon localized area of discolored intact skin or blood-filled blister due to damage of underlying soft tissue from pressure and/or shear.  Wound Description (Comments):   Present on Admission: Yes     Pressure Injury 01/15/23 Foot Left;Anterior Deep Tissue Pressure Injury - Purple or maroon localized area of discolored intact skin or blood-filled blister due to damage of underlying soft tissue from pressure and/or shear. inner foot (Active)  01/15/23   Location: Foot  Location Orientation: Left;Anterior  Staging: Deep Tissue Pressure Injury - Purple or maroon localized area of discolored  intact skin or blood-filled blister due to damage of underlying soft tissue from pressure and/or shear.  Wound Description (Comments): inner foot  Present on Admission: Yes   DVT prophylaxis: enoxaparin (LOVENOX) injection 40 mg Start: 01/15/23 1500 heparin injection 4,000 Units Start: 01/15/23 1145 Code Status:   Code Status: Full Code Family Communication: plan of care discussed with patient at bedside. Patient status is:  inpatient  because of dka mi Level of care: Telemetry Cardiac   Dispo: The patient is from: home w/ wife.            Anticipated disposition: SNF, TOC consulted Objective: Vitals last 24 hrs: Vitals:   01/18/23 0823 01/18/23 0828 01/18/23 0833 01/18/23 0838  BP: 114/65 116/68 123/75 132/84  Pulse: 92 87 90 92  Resp: 12 13 15 12   Temp:      TempSrc:      SpO2: 98% 99% 99% 99%  Weight:      Height:       Weight change: 0.8 kg  Physical Examination: General exam: AAox3, weak,older appearing HEENT:Oral mucosa moist, Ear/Nose WNL grossly, dentition normal. Respiratory system: bilaterally clear BS, no use of accessory muscle Cardiovascular system: S1 & S2 +, regular rate. Gastrointestinal system: Abdomen soft, NT,ND,BS+ Nervous System:Alert, awake, moving extremities and grossly nonfocal Extremities: LE ankle edema neg, bilateral toes  on previous right great toe amputated with small open Skin: No rashes,no icterus. MSK: Normal muscle bulk,tone, power General exam: alert awake, older than stated age  Medications reviewed:  Scheduled Meds:  aspirin  81 mg Oral Daily   atorvastatin  80 mg Oral Daily   Chlorhexidine Gluconate Cloth  6 each Topical Q0600   enoxaparin (LOVENOX) injection  40 mg Subcutaneous Q24H   heparin  4,000 Units Intravenous Once   insulin aspart  0-5 Units Subcutaneous QHS   insulin aspart  0-9 Units Subcutaneous TID WC   insulin aspart  5 Units Subcutaneous TID WC   insulin glargine-yfgn  35 Units Subcutaneous QHS   leptospermum  manuka honey  1 Application Topical Daily   midodrine  2.5 mg Oral TID WC   mupirocin ointment  1 Application Nasal BID   ondansetron (ZOFRAN) IV  4 mg Intravenous Once   sodium chloride flush  3 mL Intravenous Q12H   sodium chloride flush  3 mL Intravenous Q12H   ticagrelor  90 mg Oral BID   Continuous Infusions:  sodium chloride     sodium chloride 100 mL/hr at 01/18/23 0640   sodium chloride     sodium chloride      Diet Order             Diet regular Room service appropriate? Yes; Fluid consistency: Thin  Diet effective now                  Intake/Output Summary (Last 24 hours) at 01/18/2023 1105 Last data filed at 01/18/2023 1039 Gross per 24 hour  Intake 660 ml  Output 2600 ml  Net -1940 ml   Net IO Since Admission: 1,131.72 mL [01/18/23 1105]  Wt Readings from Last 3 Encounters:  01/18/23 85 kg  10/01/22 86.2 kg  08/23/22 79.9 kg     Unresulted Labs (From admission, onward)     Start     Ordered   01/22/23 0500  Creatinine, serum  (enoxaparin (LOVENOX)    CrCl >/= 30 ml/min)  Weekly,   R     Comments: while on enoxaparin therapy    01/15/23 1400   01/19/23 0500  Lipid panel  (Labs - [facility] only)  Tomorrow morning,   R       Question:  Specimen collection method  Answer:  Lab=Lab collect   01/18/23 1103          Data Reviewed: I have personally reviewed following labs and imaging studies CBC: Recent Labs  Lab 01/15/23 1111 01/15/23 1214 01/16/23 0557 01/17/23 0056 01/18/23 0044  WBC 15.1*  --  9.6 8.5 6.6  NEUTROABS 11.6*  --   --   --   --   HGB 13.2 12.2* 11.1* 11.7* 10.8*  HCT 40.6 36.0* 34.2* 34.9* 33.1*  MCV 94.0  --  92.4 90.9 93.0  PLT 279  --  223 212 225   Basic Metabolic Panel: Recent Labs  Lab 01/15/23 1754 01/15/23 2044 01/16/23 0557 01/17/23 0056 01/18/23 0044  NA 136 136 137 129* 132*  K 3.9 4.4 3.4* 3.8 4.3  CL 100 101 101 97* 100  CO2 21* 19* 24 22 26   GLUCOSE 261* 162* 176* 260* 375*  BUN 13 13 12 13 15    CREATININE 0.88 0.75 0.64 0.64 0.88  CALCIUM 9.6 9.6 9.2 8.7* 8.5*  MG  --   --  1.7 1.9  --    GFR: Estimated Creatinine Clearance: 91.2 mL/min (by C-G  formula based on SCr of 0.88 mg/dL). Liver Function Tests: Recent Labs  Lab 01/15/23 1111  AST 20  ALT 11  ALKPHOS 103  BILITOT 1.1  PROT 6.7  ALBUMIN 3.2*  Lipid Profile: Recent Labs    01/16/23 0557  CHOL 146  HDL 38*  LDLCALC 94  TRIG 68  CHOLHDL 3.8   Recent Results (from the past 240 hour(s))  SARS Coronavirus 2 by RT PCR (hospital order, performed in [facility] hospital lab) *cepheid single result test* Anterior Nasal Swab     Status: None   Collection Time: 01/15/23  3:20 PM   Specimen: Anterior Nasal Swab  Result Value Ref Range Status   SARS Coronavirus 2 by RT PCR NEGATIVE NEGATIVE Final    Comment: Performed at [facility] Lab, [address], [place], [place] [address]  MRSA Next Gen by PCR, Nasal     Status: Abnormal   Collection Time: 01/15/23  3:26 PM   Specimen: Anterior Nasal Swab  Result Value Ref Range Status   MRSA by PCR Next Gen DETECTED (A) NOT DETECTED Final    Comment: RESULT CALLED TO, READ BACK BY AND VERIFIED WITH: RN  AShela Nevin [phone] @ 1702 FH (NOTE) The GeneXpert MRSA Assay (FDA approved for NASAL specimens only), is one component of a comprehensive MRSA colonization surveillance program. It is not intended to diagnose MRSA infection nor to guide or monitor treatment for MRSA infections. Test performance is not FDA approved in patients less than 10 years old. Performed at [facility], [address], [place], [place] [address]     Antimicrobials: Anti-infectives (From admission, onward)    None      Culture/Microbiology    Component Value Date/Time   SDES BLOOD SITE NOT SPECIFIED 04/27/2020 0904   SPECREQUEST  04/27/2020 0904    BOTTLES DRAWN AEROBIC AND ANAEROBIC Blood Culture results may not be optimal due to an excessive volume of blood received in culture  bottles   CULT  04/27/2020 0904    NO GROWTH 5 DAYS Performed at [facility], [address] [place], [place] [address]    REPTSTATUS 05/02/2020 FINAL 04/27/2020 9562  Other culture-see note  Radiology Studies: PERIPHERAL VASCULAR CATHETERIZATION  Result Date: 01/18/2023 Images from the original result were not included. Patient name: Micheal Moore. MRN: [id number] DOB: 1951-07-15 Sex: male 01/18/2023 Pre-operative Diagnosis: Bilateral lower extremity ulcers Post-operative diagnosis:  Same Surgeon:  Durene Cal Procedure Performed:  1.  Ultrasound-guided access, left femoral artery  2.  Abdominal aortogram  3.  Bilateral lower extremity runoff  4.  Second-order catheterization  5.  Conscious sedation, 32 minutes  6.  Closure device, Celt Indications: This is a 72 year old gentleman with bilateral lower extremity nonhealing wounds.  He comes in today for angiographic evaluation Procedure:  The patient was identified in the holding area and taken to room 8.  The patient was then placed supine on the table and prepped and draped in the usual sterile fashion.  A time out was called.  Conscious sedation was administered with the use of IV fentanyl and Versed under continuous physician and nurse monitoring.  Heart rate, blood pressure, and oxygen saturation were continuously monitored.  Total sedation time was 36 minutes.  Ultrasound was used to evaluate the left common femoral artery.  It was patent .  A digital ultrasound image was acquired.  A micropuncture needle was used to access the left common femoral artery under ultrasound guidance.  An 018 wire was advanced without resistance and a micropuncture sheath was placed.  The 018 wire was removed and a benson wire was placed.  The micropuncture sheath was exchanged for a 5 french sheath.  An omniflush catheter was advanced over the wire to the level of L-1.  An abdominal angiogram was obtained.  Next, using the omniflush catheter and a benson  wire, the aortic bifurcation was crossed and the catheter was placed into theright external iliac artery and right runoff was obtained.  left runoff was performed via retrograde sheath injections. Findings:  Aortogram: No significant renal artery stenosis was visualized.  The infrarenal abdominal aorta is widely patent.  Bilateral common and external iliac arteries are widely patent.  Right Lower Extremity: The right common femoral, profundofemoral, superficial femoral, and popliteal artery are widely patent.  There is maybe 20% stenosis within the proximal popliteal artery.  There is two-vessel runoff via the peroneal and posterior tibial artery.  The peroneal artery reconstitutes the anterior tibial artery at the ankle.  The pedal arch is intact with good digital perfusion.  The anterior tibial artery is atretic  Left Lower Extremity: The left common femoral, profundofemoral, superficial femoral, and popliteal artery are widely patent.  There is three-vessel runoff to the ankle.  There is a 60% stenosis within the proximal third of the anterior tibial artery.  There is good digital artery opacification.  The pedal arch is not intact but there is good collateralization to the toes. Intervention: The groin was closed with a Celt and was hemostatic Impression:  #1  No significant aortoiliac occlusive disease  #2  No significant femoral-popliteal occlusive disease bilaterally  #3  Atretic right anterior tibial artery however the peroneal artery collateralizes at the ankle and the pedal arch is intact with good digital perfusion  #4  Three-vessel runoff on the left leg with a 60% stenosis in the proximal third of the anterior tibial artery.  The pedal arch is not intact but there is good digital artery perfusion  #5  Patient should have adequate blood flow for wound healing.  No intervention is recommended V. Durene Cal, M.D., [facility] [place] Office: [phone] Pager:  [phone]    VAS [place] ABI WITH/WO TBI  Result Date: 01/16/2023  LOWER EXTREMITY DOPPLER STUDY Patient Name:  LILLARD VANDERHOEK.  Date of Exam:   01/16/2023 Medical Rec #: [id number]                  Accession #:    [id number] Date of Birth: Feb 22, 1951                  Patient Gender: M Patient Age:   36 years Exam [facility]  [facility] Procedure:      VAS [place] ABI WITH/WO TBI Referring Phys: CALLIE GOODRICH --------------------------------------------------------------------------------  Indications: Ulceration. Left great toe amputation High Risk Factors: Diabetes, past history of smoking.  Comparison Study: Previous study 04/28/20. Performing Technologist: McKayla Maag RVT, [place]  Examination Guidelines: A complete evaluation includes at minimum, Doppler waveform signals and systolic blood pressure reading at the level of bilateral brachial, anterior tibial, and posterior tibial arteries, when vessel segments are accessible. Bilateral testing is considered an integral part of a complete examination. Photoelectric Plethysmograph (PPG) waveforms and toe systolic pressure readings are included as required and additional duplex testing as needed. Limited examinations for reoccurring indications may be performed as noted.  ABI Findings: +---------+------------------+-----+---------+--------+ Right    Rt Pressure (  mmHg)IndexWaveform Comment  +---------+------------------+-----+---------+--------+ Brachial 141                    triphasic         +---------+------------------+-----+---------+--------+ PTA      192               1.36 triphasic         +---------+------------------+-----+---------+--------+ DP       195               1.38 triphasic         +---------+------------------+-----+---------+--------+ Great Toe229               1.62 Normal            +---------+------------------+-----+---------+--------+ +---------+------------------+-----+---------+---------------------------------+  Left     Lt Pressure (mmHg)IndexWaveform Comment                           +---------+------------------+-----+---------+---------------------------------+ Brachial 127                    triphasic                                  +---------+------------------+-----+---------+---------------------------------+ PTA      180               1.28 triphasic                                  +---------+------------------+-----+---------+---------------------------------+ DP       181               1.28 triphasic                                  +---------+------------------+-----+---------+---------------------------------+ Great Toe84                0.60 Abnormal Obtained on second toe due to                                              great toe amputation.             +---------+------------------+-----+---------+---------------------------------+ +-------+-----------+-----------+--------------------------------+------------+ ABI/TBIToday's ABIToday's TBIPrevious ABI                    Previous TBI +-------+-----------+-----------+--------------------------------+------------+ Right  1.38       1.62       No pressures obtained due to DVT             +-------+-----------+-----------+--------------------------------+------------+ Left   1.28       0.60       1.10                                         +-------+-----------+-----------+--------------------------------+------------+  Summary: Right: Resting right ankle-brachial index is within normal range. The right toe-brachial index is normal. Left: Resting left ankle-brachial index is within normal range. The left toe-brachial index is abnormal. *See table(s) above for measurements and observations.  Electronically signed by Coral Else MD on 01/16/2023 at 10:14:23 PM.  Final      LOS: 3 days   Lanae Boast, MD [facility]  01/18/2023, 11:05 AM

## 2023-01-18 NOTE — Interval H&P Note (Signed)
History and Physical Interval Note:  01/18/2023 7:41 AM  Micheal Moore.  has presented today for surgery, with the diagnosis of critical limb ischemia with tissue loss.  The various methods of treatment have been discussed with the patient and family. After consideration of risks, benefits and other options for treatment, the patient has consented to  Procedure(s): Lower Extremity Angiography (Bilateral) as a surgical intervention.  The patient's history has been reviewed, patient examined, no change in status, stable for surgery.  I have reviewed the patient's chart and labs.  Questions were answered to the patient's satisfaction.     Durene Cal

## 2023-01-18 NOTE — Plan of Care (Signed)
  Problem: Clinical Measurements: Goal: Respiratory complications will improve Outcome: Progressing Goal: Cardiovascular complication will be avoided Outcome: Progressing   

## 2023-01-18 NOTE — Progress Notes (Signed)
PT Cancellation Note  Patient Details Name: Micheal Moore. MRN: [id number] DOB: 10-23-1950   Cancelled Treatment:    Reason Eval/Treat Not Completed: Medical issues which prohibited therapy. Pt on bedrest after procedure this AM.    Angelina Ok [facility] 01/18/2023, 1:29 PM Skip Mayer PT Acute Rehabilitation Services Office [phone]

## 2023-01-18 NOTE — Consult Note (Signed)
[facility] Customer service manager [facility]) Accountable Care Organization (ACO) [facility] Liaison Note  01/18/2023  Micheal Moore 28-Mar-1951 [id number]  Location: [facility] RN Hospital Liaison screened the patient remotely at [facility].  Insurance: [employer]   Micheal Moore. is a 72 y.o. male who is a Primary Care Patient of [name], MD (ENT provider). The patient was screened for  day readmission hospitalization with noted high risk score for unplanned readmission risk with 3 IP/2 ED in 6 months.  The patient was assessed for potential [facility]) Care Management service needs for post hospital transition for care coordination. Review of patient's electronic medical record reveals patient receptive to HHRN/PT. Spoke with wife Micheal Moore who indicated pt was seeing Dr. Evlyn Kanner for everything and she was attempting to connect pt with [facility] for a PCP. States she would call today to see if they are taking new patients. [facility] liaison provided the contact number. Spouse receptive to a post-hospital follow up from [facility] RN until she connects pt with the new provider.   Plan: [facility] Liaison will continue to follow progress and disposition to asess for post hospital community care coordination/management needs.  Referral request for community care coordination: anticipate [facility] Transitions of Care Team follow up.   [facility] Care Management/Population Health does not replace or interfere with any arrangements made by the Inpatient Transition of Care team.   For questions contact:   Elliot Cousin, RN, BSN [facility] Liaison [place]   [facility]  Population Health Office Hours MTWF 8:00 am to 6 pm off on Thursday [phone] mobile [phone] [Office toll free line][facility] Office Hours are M-F 8:30 - 5 pm 24 hour nurse advise line [phone] Conceirge  [name]

## 2023-01-18 NOTE — Progress Notes (Signed)
PT Cancellation Note  Patient Details Name: Micheal Moore. MRN: [id number] DOB: 05-23-1951   Cancelled Treatment:    Reason Eval/Treat Not Completed: Patient at procedure or test/unavailable.   Angelina Ok [facility] 01/18/2023, 8:26 AM Skip Mayer PT Acute [employer] [phone]

## 2023-01-19 DIAGNOSIS — I2111 ST elevation (STEMI) myocardial infarction involving right coronary artery: Secondary | ICD-10-CM | POA: Diagnosis not present

## 2023-01-19 LAB — LIPID PANEL
Cholesterol: 122 mg/dL (ref 0–200)
HDL: 37 mg/dL — ABNORMAL LOW (ref >40)
LDL Cholesterol: 66 mg/dL (ref 0–99)
Total CHOL/HDL Ratio: 3.3 RATIO
Triglycerides: 95 mg/dL (ref <150)
VLDL: 19 mg/dL (ref 0–40)

## 2023-01-19 LAB — GLUCOSE, CAPILLARY
Glucose-Capillary: 214 mg/dL — ABNORMAL HIGH (ref 70–99)
Glucose-Capillary: 240 mg/dL — ABNORMAL HIGH (ref 70–99)
Glucose-Capillary: 272 mg/dL — ABNORMAL HIGH (ref 70–99)
Glucose-Capillary: 217 mg/dL — ABNORMAL HIGH (ref 70–99)

## 2023-01-19 MED ORDER — INSULIN ASPART 100 UNIT/ML IJ SOLN
8.0000 [IU] | Freq: Three times a day (TID) | INTRAMUSCULAR | Status: DC
  Administered 2023-01-19 – 2023-01-20 (×3): 8 [IU] via SUBCUTANEOUS

## 2023-01-19 NOTE — Progress Notes (Signed)
PHARMACIST LIPID MONITORING   Micheal Tiffin. is a 72 y.o. male admitted on 01/15/2023 with STEMI. Pharmacy has been consulted to optimize lipid-lowering therapy with the indication of secondary prevention for clinical ASCVD.  Recent Labs:  Lipid Panel (last 6 months):   Lab Results  Component Value Date   CHOL 122 01/19/2023   TRIG 95 01/19/2023   HDL 37 (L) 01/19/2023   CHOLHDL 3.3 01/19/2023   VLDL 19 01/19/2023   LDLCALC 66 01/19/2023    Hepatic function panel (last 6 months):   Lab Results  Component Value Date   AST 20 01/15/2023   ALT 11 01/15/2023   ALKPHOS 103 01/15/2023   BILITOT 1.1 01/15/2023    SCr (since admission):   Serum creatinine: 0.88 mg/dL 16/10/96 0454 Estimated creatinine clearance: 91.2 mL/min  Current therapy and lipid therapy tolerance Current lipid-lowering therapy: atorvastatin 80 mg  Previous lipid-lowering therapies (if applicable): rosuvastatin  Documented or reported allergies or intolerances to lipid-lowering therapies (if applicable): none   Assessment:   Patient indicated for statin therapy.   Plan:    1.Statin intensity (high intensity recommended for all patients regardless of the LDL):  No statin changes. The patient is already on a high intensity statin. Currently on atorvastatin 80 mg daily.   2.Add ezetimibe (if any one of the following):   Not indicated at this time.  3.Refer to lipid clinic:   No  4.Follow-up with:  Primary care provider - [name], MD  5.Follow-up labs after discharge:  No changes in lipid therapy, repeat a lipid panel in one year.      Micheal Moore, PharmD PGY1 Pharmacy Resident   01/19/2023 2:54 PM

## 2023-01-19 NOTE — Progress Notes (Signed)
    Subjective  - POD # 1, status post bilateral lower extremity angiography  The patient has no complaints from his procedure yesterday   Physical Exam:  Palpable pedal pulses Left groin cannulation site without hematoma       Assessment/Plan:  POD #1  Bilateral lower extremity wounds: The patient has adequate blood flow for healing.  He needs to focus on wound care and pressure offloading.  No further vascular intervention is indicated.  Please contact me with any further questions.  We will sign off.  Wells Gottlieb Zuercher 01/19/2023 2:31 PM --  Vitals:   01/19/23 0809 01/19/23 1152  BP: (!) 156/70 (!) 161/94  Pulse: (!) 101 (!) 102  Resp: 16 18  Temp: 98.4 F (36.9 C) 98.2 F (36.8 C)  SpO2: 99% 98%    Intake/Output Summary (Last 24 hours) at 01/19/2023 1431 Last data filed at 01/19/2023 1028 Gross per 24 hour  Intake 2750.86 ml  Output 5050 ml  Net -2299.14 ml     Laboratory CBC    Component Value Date/Time   WBC 6.6 01/18/2023 0044   HGB 10.8 (L) 01/18/2023 0044   HGB 13.1 12/04/2021 1429   HCT 33.1 (L) 01/18/2023 0044   HCT 37.5 12/04/2021 1429   PLT 225 01/18/2023 0044   PLT 225 12/04/2021 1429    BMET    Component Value Date/Time   NA 132 (L) 01/18/2023 0044   NA 137 12/04/2021 1429   K 4.3 01/18/2023 0044   CL 100 01/18/2023 0044   CO2 26 01/18/2023 0044   GLUCOSE 375 (H) 01/18/2023 0044   BUN 15 01/18/2023 0044   BUN 17 12/04/2021 1429   CREATININE 0.88 01/18/2023 0044   CALCIUM 8.5 (L) 01/18/2023 0044   GFRNONAA >60 01/18/2023 0044   GFRAA >60 04/29/2020 0620    COAG Lab Results  Component Value Date   INR 1.1 01/15/2023   INR 1.0 07/19/2022   INR 1.1 02/25/2022   No results found for: "PTT"  Antibiotics Anti-infectives (From admission, onward)    None        V. Charlena Cross, M.D., [facility] [place] Office: [phone] Pager:  [phone]

## 2023-01-19 NOTE — Progress Notes (Signed)
PROGRESS NOTE Micheal Moore.  ZOX:[id number] DOB: Sep 19, 1950 DOA: 01/15/2023 PCP: [name], MD  Brief Narrative/Hospital Course: 72 year old male with pertinent PMH DMT2, Chronic HFpEF, syncope secondary to orthostasis, previous DVT no longer on [facility], tobacco abuse quit 8 months ago presents to [facility] ED on 4/30 for STEMI. He went to PCP and CBG found to be 500s so was sent to [facility] ED for further eval.  On arrival patient EKG showing ST elevations in inferior leads.  Patient also complaining of some dizziness on arrival patient hypotensive).  Patient denied any chest pain or SOB.  Patient was given heparin, aspirin, Zofran.  Cardiology consulted and patient transferred to Cath Lab.  Cath showing Prox RCA 80% stenosed and Prox LAD 40% stenosed. Stent placed and patient taken to [facility] ICU.  Concern for possible DKA.  PCCM consulted for medical management s/p IV fluids and insulin drip started and transitioned off insulin drip to sq insulin 01/16/23  and transferred out of ICU to [facility] 5/2 At baseline he cannot walk due to his hip fracture and surgery on left. He is comfortable with insulin but has not learned yet. He had abnormal left toe-brachial index on left reviewed by Dr. 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   Seen by vascular and on 01/18/23 underwent aortogram with bilateral lower extremity runoff> no significant aortoiliac occlusive disease/femoral-popliteal occlusive disease>no significant renal artery stenosis patent infrarenal abdominal aorta and bilateral common and external iliac artery, 20% stenosis within the proximal popliteal artery pedal arch is intact with good distal perfusion on right, 60% stenosis within the proximal part of the anterior tibial artery on left -per vascular he should have adequate flow for wound healing and no intervention recommended. Insulin dose adjusted to further optimize his hyperglycemia    Subjective:  Patient seen and examined this morning  Alert awake oriented no complaints.   Still not able to do insulin injection yet   Assessment and Plan: Principal Problem:   STEMI (ST elevation myocardial infarction) (HCC) Active Problems:   Diabetic ketoacidosis without coma associated with type 2 diabetes mellitus (HCC)   Hyperglycemia   Inferior STEMI: S/P cardiac cath and stent placement.  Continue DAPT x 1 year, atorvastatin 80 mg, TTE with normal function no need for beta-blocker, follow-up with cardiology as outpatient  HLD continue high intensity atorvastatin  DKA with uncontrolled diabetes mellitus: Blood sugar remains poorly controlled ADJUSTED Semglee to 35u bedtime, premeal insulin 8 u , ssi> still not comfortable with insulin injection and has not done single injection yet, he is waiting for his wife to come this afternoon.  He will benefit with continued insulin teaching, skilled nursing facility placement. Educated on  hypoglycemia hyperglycemia and diabetes teaching. hbA1c at 13. Recent Labs  Lab 01/15/23 1601 01/15/23 1623 01/18/23 0622 01/18/23 1116 01/18/23 1612 01/18/23 2113 01/19/23 0625  GLUCAP  --    < > 387* 174* 174* 280* 214*  HGBA1C 13.3*  --   --   --   --   --   --    < > = values in this interval not displayed.     PVD with left abnormal ABI toe brachial index- s/p aortogram with bilateral runoff no intervention needed   Deconditioning/debility/frailty:patient reports he has not been ambulatory much> skilled nursing facility has been recommended   Orthostatic hypotension continue midodrine. Florinef on hold BP doing well  Hyponatremia/pseudohyponatremia due to  hyperglycemia and also component of dehydration from DKA Leukocytosis but afebrile -resolved Hypokalemia/hypomagnesemia resolved as below Recent Labs  Lab 01/15/23 1754 01/15/23 2044 01/16/23 0557 01/17/23 0056 01/18/23 0044  K 3.9 4.4 3.4* 3.8 4.3  CALCIUM 9.6 9.6 9.2 8.7*  8.5*  MG  --   --  1.7 1.9  --     Pressure injury POA on right buttock stage II, healed right and left, foot as below Pressure Injury 01/15/23 Buttocks Right Stage 2 -  Partial thickness loss of dermis presenting as a shallow open injury with a red, pink wound bed without slough. (Active)  01/15/23   Location: Buttocks  Location Orientation: Right  Staging: Stage 2 -  Partial thickness loss of dermis presenting as a shallow open injury with a red, pink wound bed without slough.  Wound Description (Comments):   Present on Admission: Yes  Dressing Type Foam - Lift dressing to assess site every shift 01/16/23 0800     Pressure Injury 01/15/23 Heel Right;Left Stage 2 -  Partial thickness loss of dermis presenting as a shallow open injury with a red, pink wound bed without slough. dry scabbed healing pressure injuries to bilat heels (Active)  01/15/23   Location: Heel  Location Orientation: Right;Left  Staging: Stage 2 -  Partial thickness loss of dermis presenting as a shallow open injury with a red, pink wound bed without slough.  Wound Description (Comments): dry scabbed healing pressure injuries to bilat heels  Present on Admission: Yes     Pressure Injury 01/15/23 Foot Left;Anterior Deep Tissue Pressure Injury - Purple or maroon localized area of discolored intact skin or blood-filled blister due to damage of underlying soft tissue from pressure and/or shear. (Active)  01/15/23   Location: Foot  Location Orientation: Left;Anterior  Staging: Deep Tissue Pressure Injury - Purple or maroon localized area of discolored intact skin or blood-filled blister due to damage of underlying soft tissue from pressure and/or shear.  Wound Description (Comments):   Present on Admission: Yes     Pressure Injury 01/15/23 Foot Left;Anterior Deep Tissue Pressure Injury - Purple or maroon localized area of discolored intact skin or blood-filled blister due to damage of underlying soft tissue from pressure  and/or shear. inner foot (Active)  01/15/23   Location: Foot  Location Orientation: Left;Anterior  Staging: Deep Tissue Pressure Injury - Purple or maroon localized area of discolored intact skin or blood-filled blister due to damage of underlying soft tissue from pressure and/or shear.  Wound Description (Comments): inner foot  Present on Admission: Yes   DVT prophylaxis: enoxaparin (LOVENOX) injection 40 mg Start: 01/15/23 1500 heparin injection 4,000 Units Start: 01/15/23 1145 Code Status:   Code Status: Full Code Family Communication: plan of care discussed with patient at bedside. Patient status is:  inpatient  because of dka mi Level of care: Telemetry Cardiac   Dispo: The patient is from: home w/ wife.            Anticipated disposition: SNF, pending Objective: Vitals last 24 hrs: Vitals:   01/19/23 0300 01/19/23 0600 01/19/23 0700 01/19/23 0809  BP: 116/67 121/65  (!) 156/70  Pulse:  80  (!) 101  Resp: 13 13  16   Temp:    98.4 F (36.9 C)  TempSrc:    Oral  SpO2: 97%   99%  Weight:   85 kg   Height:       Weight change: 0 kg  Physical Examination: General exam: AAOX3, weak,older appearing HEENT:Oral mucosa  moist, Ear/Nose WNL grossly, dentition normal. Respiratory system: bilaterally CLEAR BS, no use of accessory muscle Cardiovascular system: S1 & S2 +, regular rate. Gastrointestinal system: Abdomen soft, NT,ND,BS+ Nervous System:Alert, awake, moving extremities and grossly nonfocal Extremities: LE ankle edema neg, lower extremities warm Skin: No rashes,no icterus. MSK: Normal muscle bulk,tone, power   Medications reviewed:  Scheduled Meds:  aspirin  81 mg Oral Daily   atorvastatin  80 mg Oral Daily   Chlorhexidine Gluconate Cloth  6 each Topical Q0600   enoxaparin (LOVENOX) injection  40 mg Subcutaneous Q24H   heparin  4,000 Units Intravenous Once   insulin aspart  0-5 Units Subcutaneous QHS   insulin aspart  0-9 Units Subcutaneous TID WC   insulin aspart   8 Units Subcutaneous TID WC   insulin glargine-yfgn  35 Units Subcutaneous QHS   leptospermum manuka honey  1 Application Topical Daily   midodrine  2.5 mg Oral TID WC   mupirocin ointment  1 Application Nasal BID   ondansetron (ZOFRAN) IV  4 mg Intravenous Once   ticagrelor  90 mg Oral BID   Continuous Infusions:  sodium chloride 100 mL/hr at 01/19/23 0545    Diet Order             Diet Carb Modified Fluid consistency: Thin; Room service appropriate? Yes  Diet effective now                  Intake/Output Summary (Last 24 hours) at 01/19/2023 1147 Last data filed at 01/19/2023 1028 Gross per 24 hour  Intake 3110.86 ml  Output 5725 ml  Net -2614.14 ml    Net IO Since Admission: -2,182.42 mL [01/19/23 1147]  Wt Readings from Last 3 Encounters:  01/19/23 85 kg  10/01/22 86.2 kg  08/23/22 79.9 kg     Unresulted Labs (From admission, onward)     Start     Ordered   01/22/23 0500  Creatinine, serum  (enoxaparin (LOVENOX)    CrCl >/= 30 ml/min)  Weekly,   R     Comments: while on enoxaparin therapy    01/15/23 1400          Data Reviewed: I have personally reviewed following labs and imaging studies CBC: Recent Labs  Lab 01/15/23 1111 01/15/23 1214 01/16/23 0557 01/17/23 0056 01/18/23 0044  WBC 15.1*  --  9.6 8.5 6.6  NEUTROABS 11.6*  --   --   --   --   HGB 13.2 12.2* 11.1* 11.7* 10.8*  HCT 40.6 36.0* 34.2* 34.9* 33.1*  MCV 94.0  --  92.4 90.9 93.0  PLT 279  --  223 212 225    Basic Metabolic Panel: Recent Labs  Lab 01/15/23 1754 01/15/23 2044 01/16/23 0557 01/17/23 0056 01/18/23 0044  NA 136 136 137 129* 132*  K 3.9 4.4 3.4* 3.8 4.3  CL 100 101 101 97* 100  CO2 21* 19* 24 22 26   GLUCOSE 261* 162* 176* 260* 375*  BUN 13 13 12 13 15   CREATININE 0.88 0.75 0.64 0.64 0.88  CALCIUM 9.6 9.6 9.2 8.7* 8.5*  MG  --   --  1.7 1.9  --     GFR: Estimated Creatinine Clearance: 91.2 mL/min (by C-G formula based on SCr of 0.88 mg/dL). Liver Function  Tests: Recent Labs  Lab 01/15/23 1111  AST 20  ALT 11  ALKPHOS 103  BILITOT 1.1  PROT 6.7  ALBUMIN 3.2*   Lipid Profile: Recent Labs    01/19/23  0046  CHOL 122  HDL 37*  LDLCALC 66  TRIG 95  CHOLHDL 3.3    Recent Results (from the past 240 hour(s))  SARS Coronavirus 2 by RT PCR (hospital order, performed in [place] hospital lab) *cepheid single result test* Anterior Nasal Swab     Status: None   Collection Time: 01/15/23  3:20 PM   Specimen: Anterior Nasal Swab  Result Value Ref Range Status   SARS Coronavirus 2 by RT PCR NEGATIVE NEGATIVE Final    Comment: Performed at [facility], [address], [place], [place] [address]  MRSA Next Gen by PCR, Nasal     Status: Abnormal   Collection Time: 01/15/23  3:26 PM   Specimen: Anterior Nasal Swab  Result Value Ref Range Status   MRSA by PCR Next Gen DETECTED (A) NOT DETECTED Final    Comment: RESULT CALLED TO, READ BACK BY AND VERIFIED WITH: RN  AShela Nevin [phone] @ 1702 FH (NOTE) The GeneXpert MRSA Assay (FDA approved for NASAL specimens only), is one component of a comprehensive MRSA colonization surveillance program. It is not intended to diagnose MRSA infection nor to guide or monitor treatment for MRSA infections. Test performance is not FDA approved in patients less than 54 years old. Performed at [facility], [address] [place], [place] [address]     Antimicrobials: Anti-infectives (From admission, onward)    None      Culture/Microbiology    Component Value Date/Time   SDES BLOOD SITE NOT SPECIFIED 04/27/2020 0904   SPECREQUEST  04/27/2020 0904    BOTTLES DRAWN AEROBIC AND ANAEROBIC Blood Culture results may not be optimal due to an excessive volume of blood received in culture bottles   CULT  04/27/2020 0904    NO GROWTH 5 DAYS Performed at [facility], [address], [place], [place] [address]    REPTSTATUS 05/02/2020 FINAL 04/27/2020 7846  Other culture-see note   Radiology Studies: PERIPHERAL VASCULAR CATHETERIZATION  Result Date: 01/18/2023 Images from the original result were not included. Patient name: Micheal Moore. MRN: [id number] DOB: 20-Nov-1950 Sex: male 01/18/2023 Pre-operative Diagnosis: Bilateral lower extremity ulcers Post-operative diagnosis:  Same Surgeon:  Durene Cal Procedure Performed:  1.  Ultrasound-guided access, left femoral artery  2.  Abdominal aortogram  3.  Bilateral lower extremity runoff  4.  Second-order catheterization  5.  Conscious sedation, 32 minutes  6.  Closure device, Celt Indications: This is a 72 year old gentleman with bilateral lower extremity nonhealing wounds.  He comes in today for angiographic evaluation Procedure:  The patient was identified in the holding area and taken to room 8.  The patient was then placed supine on the table and prepped and draped in the usual sterile fashion.  A time out was called.  Conscious sedation was administered with the use of IV fentanyl and Versed under continuous physician and nurse monitoring.  Heart rate, blood pressure, and oxygen saturation were continuously monitored.  Total sedation time was 36 minutes.  Ultrasound was used to evaluate the left common femoral artery.  It was patent .  A digital ultrasound image was acquired.  A micropuncture needle was used to access the left common femoral artery under ultrasound guidance.  An 018 wire was advanced without resistance and a micropuncture sheath was placed.  The 018 wire was removed and a benson wire was placed.  The micropuncture sheath was exchanged for a 5 french sheath.  An omniflush catheter was  advanced over the wire to the level of L-1.  An abdominal angiogram was obtained.  Next, using the omniflush catheter and a benson wire, the aortic bifurcation was crossed and the catheter was placed into theright external iliac artery and right runoff was obtained.  left runoff was performed via retrograde sheath injections.  Findings:  Aortogram: No significant renal artery stenosis was visualized.  The infrarenal abdominal aorta is widely patent.  Bilateral common and external iliac arteries are widely patent.  Right Lower Extremity: The right common femoral, profundofemoral, superficial femoral, and popliteal artery are widely patent.  There is maybe 20% stenosis within the proximal popliteal artery.  There is two-vessel runoff via the peroneal and posterior tibial artery.  The peroneal artery reconstitutes the anterior tibial artery at the ankle.  The pedal arch is intact with good digital perfusion.  The anterior tibial artery is atretic  Left Lower Extremity: The left common femoral, profundofemoral, superficial femoral, and popliteal artery are widely patent.  There is three-vessel runoff to the ankle.  There is a 60% stenosis within the proximal third of the anterior tibial artery.  There is good digital artery opacification.  The pedal arch is not intact but there is good collateralization to the toes. Intervention: The groin was closed with a Celt and was hemostatic Impression:  #1  No significant aortoiliac occlusive disease  #2  No significant femoral-popliteal occlusive disease bilaterally  #3  Atretic right anterior tibial artery however the peroneal artery collateralizes at the ankle and the pedal arch is intact with good digital perfusion  #4  Three-vessel runoff on the left leg with a 60% stenosis in the proximal third of the anterior tibial artery.  The pedal arch is not intact but there is good digital artery perfusion  #5  Patient should have adequate blood flow for wound healing.  No intervention is recommended V. Durene Cal, M.D., [facility] [place] Office: [phone] Pager:  [phone]     LOS: 4 days   Lanae Boast, MD [facility]  01/19/2023, 11:47 AM

## 2023-01-19 NOTE — Progress Notes (Addendum)
Patient educated on how to self inject insulin subcutaneously. Patient returned demonstration and self injected lunch time insulin dose without any difficulty. Patient stated that his spouse is to come this afternoon so that they can both  learn how to use the insulin pen. Elnita Maxwell, RN  [phone]: Patient self administered his subq insulin without difficulty. Patient's spouse did not show up for insulin administration education. Patient states his wife will be here tomorrow. Patient states he does not want to learn about insulin flex pen use without wife being present.  Elnita Maxwell, RN

## 2023-01-20 DIAGNOSIS — I2111 ST elevation (STEMI) myocardial infarction involving right coronary artery: Secondary | ICD-10-CM | POA: Diagnosis not present

## 2023-01-20 LAB — GLUCOSE, CAPILLARY
Glucose-Capillary: 201 mg/dL — ABNORMAL HIGH (ref 70–99)
Glucose-Capillary: 299 mg/dL — ABNORMAL HIGH (ref 70–99)

## 2023-01-20 MED ORDER — INSULIN ASPART 100 UNIT/ML IJ SOLN
10.0000 [IU] | Freq: Three times a day (TID) | INTRAMUSCULAR | Status: DC
  Administered 2023-01-20: 10 [IU] via SUBCUTANEOUS

## 2023-01-20 MED ORDER — ATORVASTATIN CALCIUM 80 MG PO TABS: 80.0000 mg | ORAL_TABLET | Freq: Every day | ORAL | 0 refills | Status: DC

## 2023-01-20 MED ORDER — LANCETS MISC
1.0000 | Freq: Three times a day (TID) | 0 refills | Status: DC
Start: 2023-01-20 — End: 2023-12-26

## 2023-01-20 MED ORDER — GLUCAGON EMERGENCY 1 MG IJ KIT: 1.0000 mg | PACK | INTRAMUSCULAR | 0 refills | Status: DC | PRN

## 2023-01-20 MED ORDER — BLOOD GLUCOSE TEST VI STRP: 1.0000 | ORAL_STRIP | Freq: Three times a day (TID) | 0 refills | Status: DC

## 2023-01-20 MED ORDER — ASPIRIN 81 MG PO TBEC
81.0000 mg | DELAYED_RELEASE_TABLET | Freq: Every day | ORAL | 12 refills | Status: DC
Start: 2023-01-20 — End: 2024-05-28

## 2023-01-20 MED ORDER — BLOOD GLUCOSE MONITORING SUPPL DEVI
1.0000 | Freq: Three times a day (TID) | 0 refills | Status: DC
Start: 2023-01-20 — End: 2023-12-26

## 2023-01-20 MED ORDER — LANCET DEVICE MISC
1.0000 | Freq: Three times a day (TID) | 0 refills | Status: DC
Start: 2023-01-20 — End: 2023-12-26

## 2023-01-20 MED ORDER — INSULIN ASPART 100 UNIT/ML FLEXPEN
8.0000 [IU] | PEN_INJECTOR | Freq: Three times a day (TID) | SUBCUTANEOUS | 0 refills | Status: DC
Start: 2023-01-20 — End: 2023-04-22

## 2023-01-20 MED ORDER — INSULIN GLARGINE 100 UNIT/ML SOLOSTAR PEN: 35.0000 [IU] | PEN_INJECTOR | Freq: Every day | SUBCUTANEOUS | 0 refills | Status: DC

## 2023-01-20 MED ORDER — PEN NEEDLES 31G X 5 MM MISC
1.0000 | Freq: Three times a day (TID) | 0 refills | Status: DC
Start: 2023-01-20 — End: 2024-02-27

## 2023-01-20 MED ORDER — TICAGRELOR 90 MG PO TABS: 90.0000 mg | ORAL_TABLET | Freq: Two times a day (BID) | ORAL | 0 refills | Status: AC

## 2023-01-20 MED ORDER — INSULIN GLARGINE-YFGN 100 UNIT/ML ~~LOC~~ SOLN
40.0000 [IU] | Freq: Every day | SUBCUTANEOUS | Status: DC
  Filled 2023-01-20: qty 0.4

## 2023-01-20 NOTE — Plan of Care (Signed)
Discharging to home, with home health.  Discussed and demonstrated use of Insulin Pen with wife and patient.  Wife went home before the video to take pt's wheelchair and prepare.  Wife said she would be the primary one administering the insulin pen.  She was able to teach back after discussion and demonstration.

## 2023-01-20 NOTE — Discharge Summary (Signed)
Physician Discharge Summary  Micheal Moore. ZOX:[id number] DOB: 06-Mar-1951 DOA: 01/15/2023  PCP: [name], MD  Admit date: 01/15/2023 Discharge date: 01/20/2023 Recommendations for Outpatient Follow-up:  Follow up with PCP in 1 weeks-call for appointment Please obtain BMP/CBC in one week  Discharge Dispo: home Discharge Condition: Stable Code Status:   Code Status: Full Code Diet recommendation:  Diet Order             Diet Carb Modified Fluid consistency: Thin; Room service appropriate? Yes  Diet effective now                    Brief/Interim Summary: 72 year old male with pertinent PMH DMT2, Chronic HFpEF, syncope secondary to orthostasis, previous DVT no longer on [facility] tobacco abuse quit 8 months ago presents to [facility] ED on 4/30 for STEMI. He went to PCP and CBG found to be 500s so was sent to [facility] ED for further eval.  On arrival patient EKG showing ST elevations in inferior leads.  Patient also complaining of some dizziness on arrival patient hypotensive).  Patient denied any chest pain or SOB.  Patient was given heparin, aspirin, Zofran.  Cardiology consulted and patient transferred to Cath Lab.  Cath showing Prox RCA 80% stenosed and Prox LAD 40% stenosed. Stent placed and patient taken to [facility] ICU.  Concern for possible DKA.  PCCM consulted for medical management s/p IV fluids and insulin drip started and transitioned off insulin drip to sq insulin 01/16/23  and transferred out of ICU to [facility] 5/2 At baseline he cannot walk due to his hip fracture and surgery on left. He is comfortable with insulin but has not learned yet. He had abnormal left toe-brachial index on left reviewed by Dr. 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   Seen by vascular and on 01/18/23 underwent aortogram with bilateral lower extremity runoff> no significant aortoiliac occlusive disease/femoral-popliteal occlusive disease>no significant renal artery  stenosis patent infrarenal abdominal aorta and bilateral common and external iliac artery, 20% stenosis within the proximal popliteal artery pedal arch is intact with good distal perfusion on right, 60% stenosis within the proximal part of the anterior tibial artery on left -per vascular he should have adequate flow for wound healing and no intervention recommended. Insulin dose adjusted to further optimize his hyperglycemia   Discharge Diagnoses:  Principal Problem:   STEMI (ST elevation myocardial infarction) (HCC) Active Problems:   Diabetic ketoacidosis without coma associated with type 2 diabetes mellitus (HCC)   Hyperglycemia  Inferior STEMI: S/P cardiac cath and stent placement.  Continue DAPT x 1 year, atorvastatin 80 mg, TTE with normal function no need for beta-blocker, follow-up with cardiology as outpatient  HLD continue high intensity atorvastatin  DKA with uncontrolled diabetes mellitus: Blood sugar remains poorly controlled ADJUSTED Semglee to 35u bedtime, premeal insulin 8 u , ssi> still not comfortable with insulin injection and has not done single injection yet, he is waiting for his wife to come this afternoon.  He will benefit with continued insulin teaching, skilled nursing facility placement. Educated on  hypoglycemia hyperglycemia and diabetes teaching. hbA1c at 13. Recent Labs  Lab 01/15/23 1601 01/15/23 1623 01/19/23 1150 01/19/23 1638 01/19/23 2151 01/20/23 0631 01/20/23 1059  GLUCAP  --    < > 240* 272* 217* 201* 299*  HGBA1C 13.3*  --   --   --   --   --   --    < > =  values in this interval not displayed.    PVD with left abnormal ABI toe brachial index- s/p aortogram with bilateral runoff no intervention needed   Deconditioning/debility/frailty:patient reports he has not been ambulatory much> skilled nursing facility has been recommended   Orthostatic hypotension continue midodrine. Florinef on hold BP doing well  Hyponatremia/pseudohyponatremia due to  hyperglycemia and also component of dehydration from DKA Leukocytosis but afebrile -resolved Hypokalemia/hypomagnesemia resolved as below Recent Labs  Lab 01/15/23 1754 01/15/23 2044 01/16/23 0557 01/17/23 0056 01/18/23 0044  K 3.9 4.4 3.4* 3.8 4.3  CALCIUM 9.6 9.6 9.2 8.7* 8.5*  MG  --   --  1.7 1.9  --     Pressure Ulcer: Pressure Injury 01/15/23 Buttocks Right Stage 2 -  Partial thickness loss of dermis presenting as a shallow open injury with a red, pink wound bed without slough. (Active)  01/15/23   Location: Buttocks  Location Orientation: Right  Staging: Stage 2 -  Partial thickness loss of dermis presenting as a shallow open injury with a red, pink wound bed without slough.  Wound Description (Comments):   Present on Admission: Yes  Dressing Type Foam - Lift dressing to assess site every shift 01/20/23 1003     Pressure Injury 01/15/23 Heel Right;Left Stage 2 -  Partial thickness loss of dermis presenting as a shallow open injury with a red, pink wound bed without slough. dry scabbed healing pressure injuries to bilat heels (Active)  01/15/23   Location: Heel  Location Orientation: Right;Left  Staging: Stage 2 -  Partial thickness loss of dermis presenting as a shallow open injury with a red, pink wound bed without slough.  Wound Description (Comments): dry scabbed healing pressure injuries to bilat heels  Present on Admission: Yes  Dressing Type Foam - Lift dressing to assess site every shift 01/20/23 1003     Pressure Injury 01/15/23 Foot Left;Anterior Deep Tissue Pressure Injury - Purple or maroon localized area of discolored intact skin or blood-filled blister due to damage of underlying soft tissue from pressure and/or shear. (Active)  01/15/23   Location: Foot  Location Orientation: Left;Anterior  Staging: Deep Tissue Pressure Injury - Purple or maroon localized area of discolored intact skin or blood-filled blister due to damage of underlying soft tissue from  pressure and/or shear.  Wound Description (Comments):   Present on Admission: Yes  Dressing Type Foam - Lift dressing to assess site every shift 01/19/23 0759     Pressure Injury 01/15/23 Foot Left;Anterior Deep Tissue Pressure Injury - Purple or maroon localized area of discolored intact skin or blood-filled blister due to damage of underlying soft tissue from pressure and/or shear. inner foot (Active)  01/15/23   Location: Foot  Location Orientation: Left;Anterior  Staging: Deep Tissue Pressure Injury - Purple or maroon localized area of discolored intact skin or blood-filled blister due to damage of underlying soft tissue from pressure and/or shear.  Wound Description (Comments): inner foot  Present on Admission: Yes  Dressing Type Foam - Lift dressing to assess site every shift 01/20/23 1003    Consults: Cardiology, vascular Subjective: Alert awake oriented  Discharge Exam: Vitals:   01/20/23 0750 01/20/23 1102  BP: 131/82 114/63  Pulse: 66 99  Resp: 14 14  Temp: 97.9 F (36.6 C) 98.5 F (36.9 C)  SpO2: 100%    General: Pt is alert, awake, not in acute distress Cardiovascular: RRR, S1/S2 +, no rubs, no gallops Respiratory: CTA bilaterally, no wheezing, no rhonchi Abdominal: Soft, NT, ND, bowel  sounds + Extremities: no edema, no cyanosis  Discharge Instructions  Discharge Instructions     Amb Referral to Cardiac Rehabilitation   Complete by: As directed    Diagnosis:  Coronary Stents STEMI     After initial evaluation and assessments completed: Virtual Based Care may be provided alone or in conjunction with Phase 2 Cardiac Rehab based on patient barriers.: Yes   Intensive Cardiac Rehabilitation (ICR) [facility] location only OR Traditional Cardiac Rehabilitation (TCR) *If criteria for ICR are not met will enroll in TCR [facility] only): Yes   Amb Referral to Nutrition and Diabetic Education   Complete by: As directed    Discharge instructions   Complete by: As directed     Check blood sugar 3 times a day and bedtime at home. If blood sugar running above 200 less than 70 please call your MD to adjust insulin. If blood sugars running less 100 do not use insulin and call MD. If you noticed signs and symptoms of hypoglycemia or low blood sugar like jitteriness, confusion, thirst, tremor, sweating- Check blood sugar, drink sugary drink/biscuits/sweets to increase sugar level and call MD or return to ER.   Discharge wound care:   Complete by: As directed    Apply Medihoney to left posterior leg, right great toe Q day, then cover with foam dressing.  Change foam dressing Q 3 days or PRN soiling 2. Foam dressing to right posterior leg and right buttock and bilat heels. Change Q 3 days or PRN 3. Dry scabs to toes can remain open to air      Allergies as of 01/20/2023   No Known Allergies      Medication List     TAKE these medications    aspirin EC 81 MG tablet Take 1 tablet (81 mg total) by mouth daily. Swallow whole.   atorvastatin 80 MG tablet Commonly known as: LIPITOR Take 1 tablet (80 mg total) by mouth daily. Start taking on: Jan 21, 2023   Blood Glucose Monitoring Suppl Devi 1 each by Does not apply route 3 (three) times daily. May dispense any manufacturer covered by patient's insurance.   BLOOD GLUCOSE TEST STRIPS Strp 1 each by Does not apply route 3 (three) times daily. Use as directed to check blood sugar. May dispense any manufacturer covered by patient's insurance and fits patient's device.   Ensure Max Protein Liqd Take 330 mLs (11 oz total) by mouth 2 (two) times daily. What changed: when to take this   fludrocortisone 0.1 MG tablet Commonly known as: FLORINEF Take 2 tablets (0.2 mg total) by mouth daily.   Glucagon Emergency 1 MG Kit Inject 1 mg into the skin as needed for up to 2 doses (Severe low blood sugar).   insulin aspart 100 UNIT/ML FlexPen Commonly known as: NOVOLOG Inject 8 Units into the skin 3 (three) times daily with  meals. Only take if eating a meal AND Blood Glucose (BG) is 80 or higher.   insulin glargine 100 UNIT/ML Solostar Pen Commonly known as: LANTUS Inject 35 Units into the skin at bedtime. May substitute as needed per insurance.   Lancet Device Misc 1 each by Does not apply route 3 (three) times daily. May dispense any manufacturer covered by patient's insurance.   Lancets Misc 1 each by Does not apply route 3 (three) times daily. Use as directed to check blood sugar. May dispense any manufacturer covered by patient's insurance and fits patient's device.   midodrine 5 MG tablet Commonly known  as: PROAMATINE Take 0.5 tablets (2.5 mg total) by mouth 3 (three) times daily with meals. What changed: how much to take   Pen Needles 31G X 5 MM Misc 1 each by Does not apply route 3 (three) times daily. May dispense any manufacturer covered by patient's insurance.   polyethylene glycol 17 g packet Commonly known as: MIRALAX / GLYCOLAX Take 17 g by mouth daily. What changed:  when to take this reasons to take this   ticagrelor 90 MG Tabs tablet Commonly known as: BRILINTA Take 1 tablet (90 mg total) by mouth 2 (two) times daily.               Discharge Care Instructions  (From admission, onward)           Start     Ordered   01/20/23 0000  Discharge wound care:       Comments: Apply Medihoney to left posterior leg, right great toe Q day, then cover with foam dressing.  Change foam dressing Q 3 days or PRN soiling 2. Foam dressing to right posterior leg and right buttock and bilat heels. Change Q 3 days or PRN 3. Dry scabs to toes can remain open to air   01/20/23 1127            Follow-up Information     [facility] Follow up.   Specialty: Home Health Services Why: for home health services Contact information: [address] [place] [address] [phone]                No Known Allergies  The results of significant diagnostics  from this hospitalization (including imaging, microbiology, ancillary and laboratory) are listed below for reference.    Microbiology: Recent Results (from the past 240 hour(s))  SARS Coronavirus 2 by RT PCR (hospital order, performed in [facility] hospital lab) *cepheid single result test* Anterior Nasal Swab     Status: None   Collection Time: 01/15/23  3:20 PM   Specimen: Anterior Nasal Swab  Result Value Ref Range Status   SARS Coronavirus 2 by RT PCR NEGATIVE NEGATIVE Final    Comment: Performed at [facility], [address], [place], [place] [address]  MRSA Next Gen by PCR, Nasal     Status: Abnormal   Collection Time: 01/15/23  3:26 PM   Specimen: Anterior Nasal Swab  Result Value Ref Range Status   MRSA by PCR Next Gen DETECTED (A) NOT DETECTED Final    Comment: RESULT CALLED TO, READ BACK BY AND VERIFIED WITH: RN  AShela Nevin [phone] @ 1702 FH (NOTE) The GeneXpert MRSA Assay (FDA approved for NASAL specimens only), is one component of a comprehensive MRSA colonization surveillance program. It is not intended to diagnose MRSA infection nor to guide or monitor treatment for MRSA infections. Test performance is not FDA approved in patients less than 8 years old. Performed at [facility], [address], [place], [place] [address]     Procedures/Studies: PERIPHERAL VASCULAR CATHETERIZATION  Result Date: 01/18/2023 Images from the original result were not included. Patient name: Micheal Moore. MRN: [id number] DOB: Dec 17, 1950 Sex: male 01/18/2023 Pre-operative Diagnosis: Bilateral lower extremity ulcers Post-operative diagnosis:  Same Surgeon:  Durene Cal Procedure Performed:  1.  Ultrasound-guided access, left femoral artery  2.  Abdominal aortogram  3.  Bilateral lower extremity runoff  4.  Second-order catheterization  5.  Conscious sedation, 32 minutes  6.  Closure  device, Celt Indications: This is a 72 year old gentleman with bilateral lower extremity  nonhealing wounds.  He comes in today for angiographic evaluation Procedure:  The patient was identified in the holding area and taken to room 8.  The patient was then placed supine on the table and prepped and draped in the usual sterile fashion.  A time out was called.  Conscious sedation was administered with the use of IV fentanyl and Versed under continuous physician and nurse monitoring.  Heart rate, blood pressure, and oxygen saturation were continuously monitored.  Total sedation time was 36 minutes.  Ultrasound was used to evaluate the left common femoral artery.  It was patent .  A digital ultrasound image was acquired.  A micropuncture needle was used to access the left common femoral artery under ultrasound guidance.  An 018 wire was advanced without resistance and a micropuncture sheath was placed.  The 018 wire was removed and a benson wire was placed.  The micropuncture sheath was exchanged for a 5 french sheath.  An omniflush catheter was advanced over the wire to the level of L-1.  An abdominal angiogram was obtained.  Next, using the omniflush catheter and a benson wire, the aortic bifurcation was crossed and the catheter was placed into theright external iliac artery and right runoff was obtained.  left runoff was performed via retrograde sheath injections. Findings:  Aortogram: No significant renal artery stenosis was visualized.  The infrarenal abdominal aorta is widely patent.  Bilateral common and external iliac arteries are widely patent.  Right Lower Extremity: The right common femoral, profundofemoral, superficial femoral, and popliteal artery are widely patent.  There is maybe 20% stenosis within the proximal popliteal artery.  There is two-vessel runoff via the peroneal and posterior tibial artery.  The peroneal artery reconstitutes the anterior tibial artery at the ankle.  The pedal arch is intact with good digital perfusion.  The anterior tibial artery is atretic  Left Lower Extremity:  The left common femoral, profundofemoral, superficial femoral, and popliteal artery are widely patent.  There is three-vessel runoff to the ankle.  There is a 60% stenosis within the proximal third of the anterior tibial artery.  There is good digital artery opacification.  The pedal arch is not intact but there is good collateralization to the toes. Intervention: The groin was closed with a Celt and was hemostatic Impression:  #1  No significant aortoiliac occlusive disease  #2  No significant femoral-popliteal occlusive disease bilaterally  #3  Atretic right anterior tibial artery however the peroneal artery collateralizes at the ankle and the pedal arch is intact with good digital perfusion  #4  Three-vessel runoff on the left leg with a 60% stenosis in the proximal third of the anterior tibial artery.  The pedal arch is not intact but there is good digital artery perfusion  #5  Patient should have adequate blood flow for wound healing.  No intervention is recommended V. Durene Cal, M.D., [facility] [place] Office: [phone] Pager:  [phone]   VAS [place] ABI WITH/WO TBI  Result Date: 01/16/2023  LOWER EXTREMITY DOPPLER STUDY Patient Name:  Micheal Moore.  Date of Exam:   01/16/2023 Medical Rec #: [id number]                  Accession #:    [id number] Date of Birth: [birth year]                  Patient Gender: Judie Petit  Patient Age:   65 years Exam Location:  [facility] Procedure:      VAS [place] ABI WITH/WO TBI Referring Phys: CALLIE GOODRICH --------------------------------------------------------------------------------  Indications: Ulceration. Left great toe amputation High Risk Factors: Diabetes, past history of smoking.  Comparison Study: Previous study 04/28/20. Performing Technologist: McKayla Maag RVT, [place]  Examination Guidelines: A complete evaluation includes at minimum, Doppler waveform signals and systolic blood pressure reading at the level of bilateral  brachial, anterior tibial, and posterior tibial arteries, when vessel segments are accessible. Bilateral testing is considered an integral part of a complete examination. Photoelectric Plethysmograph (PPG) waveforms and toe systolic pressure readings are included as required and additional duplex testing as needed. Limited examinations for reoccurring indications may be performed as noted.  ABI Findings: +---------+------------------+-----+---------+--------+ Right    Rt Pressure (mmHg)IndexWaveform Comment  +---------+------------------+-----+---------+--------+ Brachial 141                    triphasic         +---------+------------------+-----+---------+--------+ PTA      192               1.36 triphasic         +---------+------------------+-----+---------+--------+ DP       195               1.38 triphasic         +---------+------------------+-----+---------+--------+ Great Toe229               1.62 Normal            +---------+------------------+-----+---------+--------+ +---------+------------------+-----+---------+---------------------------------+ Left     Lt Pressure (mmHg)IndexWaveform Comment                           +---------+------------------+-----+---------+---------------------------------+ Brachial 127                    triphasic                                  +---------+------------------+-----+---------+---------------------------------+ PTA      180               1.28 triphasic                                  +---------+------------------+-----+---------+---------------------------------+ DP       181               1.28 triphasic                                  +---------+------------------+-----+---------+---------------------------------+ Great Toe84                0.60 Abnormal Obtained on second toe due to                                              great toe amputation.              +---------+------------------+-----+---------+---------------------------------+ +-------+-----------+-----------+--------------------------------+------------+ ABI/TBIToday's ABIToday's TBIPrevious ABI                    Previous TBI +-------+-----------+-----------+--------------------------------+------------+ Right  1.38  1.62       No pressures obtained due to DVT             +-------+-----------+-----------+--------------------------------+------------+ Left   1.28       0.60       1.10                                         +-------+-----------+-----------+--------------------------------+------------+  Summary: Right: Resting right ankle-brachial index is within normal range. The right toe-brachial index is normal. Left: Resting left ankle-brachial index is within normal range. The left toe-brachial index is abnormal. *See table(s) above for measurements and observations.  Electronically signed by Coral Else MD on 01/16/2023 at 10:14:23 PM.    Final    ECHOCARDIOGRAM COMPLETE  Result Date: 01/16/2023    ECHOCARDIOGRAM REPORT   Patient Name:   Micheal Moore. Date of Exam: 01/16/2023 Medical Rec #:  [id number]                 Height:       77.0 in Accession #:    [id number]                Weight:       185.6 lb Date of Birth:  03-Nov-1950                 BSA:          2.167 m Patient Age:    72 years                  BP:           135/73 mmHg Patient Gender: M                         HR:           81 bpm. Exam Location:  Inpatient Procedure: 2D Echo, Cardiac Doppler, Color Doppler and Intracardiac            Opacification Agent Indications:    122-I22.9 Subsequent ST elevation (STEM) and non-ST elevation                 (NSTEMI) myocardial infarction  History:        Patient has prior history of Echocardiogram examinations, most                 recent 12/19/2021. Acute MI and CAD, Signs/Symptoms:Hypotension                 and Syncope; Risk Factors:Diabetes and Former Smoker.   Sonographer:    Sheralyn Boatman RDCS Referring Phys: [id number] Charlies Constable [facility]  Sonographer Comments: Technically difficult study due to poor echo windows and suboptimal parasternal window. IMPRESSIONS  1. Left ventricular ejection fraction, by estimation, is 50 to 55%. The left ventricle has low normal function. The left ventricle has no regional wall motion abnormalities. There is mild concentric left ventricular hypertrophy. Left ventricular diastolic parameters are consistent with Grade I diastolic dysfunction (impaired relaxation).  2. Right ventricular systolic function is normal. The right ventricular size is normal.  3. The mitral valve is degenerative. No evidence of mitral valve regurgitation.  4. The aortic valve was not well visualized. Aortic valve regurgitation is not visualized. No aortic stenosis is present.  5. The inferior vena cava is normal in size with greater than 50% respiratory variability,  suggesting right atrial pressure of 3 mmHg. Comparison(s): No significant change from prior study. FINDINGS  Left Ventricle: Left ventricular ejection fraction, by estimation, is 50 to 55%. The left ventricle has low normal function. The left ventricle has no regional wall motion abnormalities. Definity contrast agent was given IV to delineate the left ventricular endocardial borders. The left ventricular internal cavity size was normal in size. There is mild concentric left ventricular hypertrophy. Left ventricular diastolic parameters are consistent with Grade I diastolic dysfunction (impaired relaxation). Right Ventricle: The right ventricular size is normal. No increase in right ventricular wall thickness. Right ventricular systolic function is normal. Left Atrium: Left atrial size was normal in size. Right Atrium: Right atrial size was normal in size. Pericardium: There is no evidence of pericardial effusion. Mitral Valve: Calcified leaflet tip. The mitral valve is degenerative in appearance. No evidence of  mitral valve regurgitation. Tricuspid Valve: The tricuspid valve is normal in structure. Tricuspid valve regurgitation is not demonstrated. Aortic Valve: The aortic valve was not well visualized. Aortic valve regurgitation is not visualized. No aortic stenosis is present. Pulmonic Valve: The pulmonic valve was not well visualized. Pulmonic valve regurgitation is not visualized. Aorta: The aortic root is normal in size and structure. Venous: The inferior vena cava is normal in size with greater than 50% respiratory variability, suggesting right atrial pressure of 3 mmHg. IAS/Shunts: The atrial septum is grossly normal.  LEFT VENTRICLE PLAX 2D LVIDd:         4.70 cm     Diastology LVIDs:         3.50 cm     LV e' medial:    4.13 cm/s LV PW:         1.10 cm     LV E/e' medial:  14.1 LV IVS:        1.20 cm     LV e' lateral:   7.51 cm/s LVOT diam:     2.50 cm     LV E/e' lateral: 7.8 LV SV:         81 LV SV Index:   38 LVOT Area:     4.91 cm  LV Volumes (MOD) LV vol d, MOD A2C: 75.7 ml LV vol d, MOD A4C: 77.4 ml LV vol s, MOD A2C: 28.9 ml LV vol s, MOD A4C: 36.9 ml LV SV MOD A2C:     46.8 ml LV SV MOD A4C:     77.4 ml LV SV MOD BP:      47.6 ml RIGHT VENTRICLE             IVC RV S prime:     10.40 cm/s  IVC diam: 1.70 cm TAPSE (M-mode): 1.4 cm LEFT ATRIUM           Index        RIGHT ATRIUM           Index LA diam:      3.00 cm 1.38 cm/m   RA Area:     16.80 cm LA Vol (A2C): 21.8 ml 10.06 ml/m  RA Volume:   41.90 ml  19.34 ml/m LA Vol (A4C): 25.0 ml 11.54 ml/m  AORTIC VALVE LVOT Vmax:   85.50 cm/s LVOT Vmean:  54.400 cm/s LVOT VTI:    0.166 m  AORTA Ao Root diam: 3.70 cm MITRAL VALVE MV Area (PHT): 3.53 cm    SHUNTS MV Decel Time: 215 msec    Systemic VTI:  0.17 m MV E velocity: 58.30  cm/s  Systemic Diam: 2.50 cm MV A velocity: 70.70 cm/s MV E/A ratio:  0.82 Riley Lam MD Electronically signed by Riley Lam MD Signature Date/Time: 01/16/2023/11:53:08 AM    Final    CARDIAC  CATHETERIZATION  Result Date: 01/15/2023   Prox RCA lesion is 80% stenosed.   Prox LAD lesion is 40% stenosed.   A stent was successfully placed.   Post intervention, there is a 0% residual stenosis. 1.  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 2.  LVEDP of 2 mmHg; the patient received a 250 cc normal saline bolus. Recommendation: Dual antiplatelet therapy for at least 1 year, echocardiogram, and ICU management of hyperglycemia.  The results were reviewed with the patient's spouse and Dr. Michae Kava.    Labs: BNP (last 3 results) No results for input(s): "BNP" in the last 8760 hours. Basic Metabolic Panel: Recent Labs  Lab 01/15/23 1754 01/15/23 2044 01/16/23 0557 01/17/23 0056 01/18/23 0044  NA 136 136 137 129* 132*  K 3.9 4.4 3.4* 3.8 4.3  CL 100 101 101 97* 100  CO2 21* 19* 24 22 26   GLUCOSE 261* 162* 176* 260* 375*  BUN 13 13 12 13 15   CREATININE 0.88 0.75 0.64 0.64 0.88  CALCIUM 9.6 9.6 9.2 8.7* 8.5*  MG  --   --  1.7 1.9  --    Liver Function Tests: Recent Labs  Lab 01/15/23 1111  AST 20  ALT 11  ALKPHOS 103  BILITOT 1.1  PROT 6.7  ALBUMIN 3.2*  CBC: Recent Labs  Lab 01/15/23 1111 01/15/23 1214 01/16/23 0557 01/17/23 0056 01/18/23 0044  WBC 15.1*  --  9.6 8.5 6.6  NEUTROABS 11.6*  --   --   --   --   HGB 13.2 12.2* 11.1* 11.7* 10.8*  HCT 40.6 36.0* 34.2* 34.9* 33.1*  MCV 94.0  --  92.4 90.9 93.0  PLT 279  --  223 212 225  CBG: Recent Labs  Lab 01/19/23 1150 01/19/23 1638 01/19/23 2151 01/20/23 0631 01/20/23 1059  GLUCAP 240* 272* 217* 201* 299*   Recent Labs    01/19/23 0046  CHOL 122  HDL 37*  LDLCALC 66  TRIG 95  CHOLHDL 3.3      Component Value Date/Time   COLORURINE YELLOW 01/15/2023 1813   APPEARANCEUR CLEAR 01/15/2023 1813   APPEARANCEUR Clear 12/04/2021 1430   LABSPEC 1.033 (H) 01/15/2023 1813   PHURINE 5.0 01/15/2023 1813   GLUCOSEU >=500 (A)  01/15/2023 1813   HGBUR NEGATIVE 01/15/2023 1813   BILIRUBINUR NEGATIVE 01/15/2023 1813   BILIRUBINUR Negative 12/04/2021 1430   KETONESUR 5 (A) 01/15/2023 1813   PROTEINUR NEGATIVE 01/15/2023 1813   NITRITE NEGATIVE 01/15/2023 1813   LEUKOCYTESUR NEGATIVE 01/15/2023 1813   Sepsis Labs Recent Labs  Lab 01/15/23 1111 01/16/23 0557 01/17/23 0056 01/18/23 0044  WBC 15.1* 9.6 8.5 6.6   Microbiology Recent Results (from the past 240 hour(s))  SARS Coronavirus 2 by RT PCR (hospital order, performed in [facility] hospital lab) *cepheid single result test* Anterior Nasal Swab     Status: None   Collection Time: 01/15/23  3:20 PM   Specimen: Anterior Nasal Swab  Result Value Ref Range Status   SARS Coronavirus 2 by RT PCR NEGATIVE NEGATIVE Final    Comment: Performed at [facility], [address], [place], [place] [address]  MRSA Next Gen by PCR, Nasal     Status: Abnormal  Collection Time: 01/15/23  3:26 PM   Specimen: Anterior Nasal Swab  Result Value Ref Range Status   MRSA by PCR Next Gen DETECTED (A) NOT DETECTED Final    Comment: RESULT CALLED TO, READ BACK BY AND VERIFIED WITH: RN  AShela Nevin [phone] @ 1702 FH (NOTE) The GeneXpert MRSA Assay (FDA approved for NASAL specimens only), is one component of a comprehensive MRSA colonization surveillance program. It is not intended to diagnose MRSA infection nor to guide or monitor treatment for MRSA infections. Test performance is not FDA approved in patients less than 47 years old. Performed at [facility], [address] [place], [place] [address]      Time coordinating discharge: 35 minutes  SIGNED: Lanae Boast, MD  [facility] 01/20/2023, 1:05 PM  If 7PM-7AM, please contact night-coverage [email or web address]

## 2023-01-20 NOTE — TOC Transition Note (Addendum)
Transition of Care [facility]) - CM/SW Discharge Note   Patient Details  Name: Micheal Moore. MRN: [id number] Date of Birth: 07-01-1951  Transition of Care [facility]) CM/SW Contact:  Lawerance Sabal, RN Phone Number: 01/20/2023, 10:26 AM   Clinical Narrative:     Spoke w patient and wife at bedside.  Both would like DC to home today w [facility] services resuming through [facility].  [facility] notified of DC.  Address verified and PTAR forms provided to unit clerk.  Will call PTAR once DC order placed.  Lantus $70, and levemir is not on formulary per [facility] pharmacist. Wife states she will get a partial fill and get the rest when she gets paid. Brilinta coupon [employer] etc provided to pharmacist while on phone.    Over one hour spent verifying coverage and supply of discharge medications.   Final next level of care: Home w Home Health Services Barriers to Discharge: No Barriers Identified   Patient Goals and CMS Choice      Discharge Placement                         Discharge Plan and Services Additional resources added to the After Visit Summary for   In-house Referral: Clinical Social Work Discharge Planning Services: CM Consult                      HH Arranged: Charity fundraiser, PT [facility] Agency: [facility] Date [facility] Agency Contacted: 01/20/23 Time HH Agency Contacted: 1026 Representative spoke with at [facility] Agency: Laurelyn Sickle  Social Determinants of Health (SDOH) Interventions SDOH Screenings   Food Insecurity: No Food Insecurity (01/16/2023)  Housing: Low Risk  (01/16/2023)  Transportation Needs: No Transportation Needs (01/16/2023)  Utilities: Not At Risk (01/16/2023)  Tobacco Use: High Risk (01/16/2023)     Readmission Risk Interventions    07/25/2022    2:42 PM  Readmission Risk Prevention Plan  Transportation Screening   PCP or Specialist Appt within 5-7 Days   Home Care Screening   Medication Review (RN CM)      Information is confidential and restricted. Go to Review  Flowsheets to unlock data.

## 2023-01-21 ENCOUNTER — Telehealth: Payer: Self-pay | Admitting: *Deleted

## 2023-01-21 ENCOUNTER — Encounter (HOSPITAL_COMMUNITY): Payer: Self-pay | Admitting: Surgery

## 2023-01-21 ENCOUNTER — Telehealth: Payer: Self-pay | Admitting: Vascular Surgery

## 2023-01-21 NOTE — Telephone Encounter (Signed)
-----   Message from Victorino Sparrow, MD sent at 01/17/2023 10:57 AM EDT ----- Please schedule appt in the next few months.   Dx - PAD,abnormal ABI. do not need imaging

## 2023-01-21 NOTE — Progress Notes (Signed)
  Care Coordination   Note   01/21/2023 Name: Micheal Moore. MRN: [id number] DOB: 1951/06/19  Micheal Moore. is a 72 y.o. year old male who sees [place], [name], MD for primary care. I reached out to [employer]. by phone today to offer care coordination services.  Mr. Kilbarger was given information about Care Coordination services today including:   The Care Coordination services include support from the care team which includes your Nurse Coordinator, Clinical Social Worker, or Pharmacist.  The Care Coordination team is here to help remove barriers to the health concerns and goals most important to you. Care Coordination services are voluntary, and the patient may decline or stop services at any time by request to their care team member.   Care Coordination Consent Status: Patient agreed to services and verbal consent obtained.   Follow up plan:  Telephone appointment with care coordination team member scheduled for:  01/23/23 with SW and 02/12/23 with [facility]  Encounter Outcome:  Pt. Scheduled  [facility]  Care Coordination Care Guide  Direct Dial: [phone]

## 2023-01-23 ENCOUNTER — Telehealth: Payer: Self-pay

## 2023-01-23 DIAGNOSIS — I2119 ST elevation (STEMI) myocardial infarction involving other coronary artery of inferior wall: Secondary | ICD-10-CM | POA: Diagnosis not present

## 2023-01-23 DIAGNOSIS — I110 Hypertensive heart disease with heart failure: Secondary | ICD-10-CM | POA: Diagnosis not present

## 2023-01-23 DIAGNOSIS — I82401 Acute embolism and thrombosis of unspecified deep veins of right lower extremity: Secondary | ICD-10-CM | POA: Diagnosis not present

## 2023-01-23 DIAGNOSIS — E1151 Type 2 diabetes mellitus with diabetic peripheral angiopathy without gangrene: Secondary | ICD-10-CM | POA: Diagnosis not present

## 2023-01-23 DIAGNOSIS — L89892 Pressure ulcer of other site, stage 2: Secondary | ICD-10-CM | POA: Diagnosis not present

## 2023-01-23 DIAGNOSIS — I5032 Chronic diastolic (congestive) heart failure: Secondary | ICD-10-CM | POA: Diagnosis not present

## 2023-01-23 DIAGNOSIS — E1165 Type 2 diabetes mellitus with hyperglycemia: Secondary | ICD-10-CM | POA: Diagnosis not present

## 2023-01-23 DIAGNOSIS — I951 Orthostatic hypotension: Secondary | ICD-10-CM | POA: Diagnosis not present

## 2023-01-23 DIAGNOSIS — S81811D Laceration without foreign body, right lower leg, subsequent encounter: Secondary | ICD-10-CM | POA: Diagnosis not present

## 2023-01-23 NOTE — Patient Outreach (Signed)
  Care Coordination   01/23/2023 Name: Micheal Moore. MRN: [id number] DOB: 20-May-1951   Care Coordination Outreach Attempts:  An unsuccessful telephone outreach was attempted for a scheduled appointment today.  Follow Up Plan:  Additional outreach attempts will be made to offer the patient care coordination information and services.   Encounter Outcome:  No Answer   Care Coordination Interventions:  No, not indicated    Bevelyn Ngo, BSW, CDP Social Worker, Certified Dementia Practitioner [facility] Care Management  Care Coordination [phone]

## 2023-01-24 DIAGNOSIS — I110 Hypertensive heart disease with heart failure: Secondary | ICD-10-CM | POA: Diagnosis not present

## 2023-01-24 DIAGNOSIS — S81811D Laceration without foreign body, right lower leg, subsequent encounter: Secondary | ICD-10-CM | POA: Diagnosis not present

## 2023-01-24 DIAGNOSIS — I2119 ST elevation (STEMI) myocardial infarction involving other coronary artery of inferior wall: Secondary | ICD-10-CM | POA: Diagnosis not present

## 2023-01-24 DIAGNOSIS — I5032 Chronic diastolic (congestive) heart failure: Secondary | ICD-10-CM | POA: Diagnosis not present

## 2023-01-24 DIAGNOSIS — I82401 Acute embolism and thrombosis of unspecified deep veins of right lower extremity: Secondary | ICD-10-CM | POA: Diagnosis not present

## 2023-01-24 DIAGNOSIS — E1165 Type 2 diabetes mellitus with hyperglycemia: Secondary | ICD-10-CM | POA: Diagnosis not present

## 2023-01-24 DIAGNOSIS — L89892 Pressure ulcer of other site, stage 2: Secondary | ICD-10-CM | POA: Diagnosis not present

## 2023-01-24 DIAGNOSIS — E1151 Type 2 diabetes mellitus with diabetic peripheral angiopathy without gangrene: Secondary | ICD-10-CM | POA: Diagnosis not present

## 2023-01-24 DIAGNOSIS — I951 Orthostatic hypotension: Secondary | ICD-10-CM | POA: Diagnosis not present

## 2023-01-30 NOTE — Progress Notes (Deleted)
Office Visit    Patient Name: Micheal Moore. Date of Encounter: 01/30/2023  Primary Care Provider:  [name], MD Primary Cardiologist:  Dietrich Pates, MD Primary Electrophysiologist: None   Past Medical History    Past Medical History:  Diagnosis Date   Cancer [facility])    right elbow melanoma   Diabetes mellitus without complication (HCC)    Type II   Dizziness    DVT (deep venous thrombosis) (HCC)    right leg   Frequent falls    Near syncope    Syncope    Past Surgical History:  Procedure Laterality Date   AMPUTATION TOE Left 04/28/2020   Procedure: AMPUTATION LEFT GREAT TOE;  Surgeon: Nadara Mustard, MD;  Location: [facility] OR;  Service: Orthopedics;  Laterality: Left;   CORONARY/GRAFT ACUTE MI REVASCULARIZATION N/A 01/15/2023   Procedure: Coronary/Graft Acute MI Revascularization;  Surgeon: Orbie Pyo, MD;  Location: [facility] INVASIVE CV LAB;  Service: Cardiovascular;  Laterality: N/A;   INTRAMEDULLARY (IM) NAIL INTERTROCHANTERIC Right 02/23/2022   Procedure: INTRAMEDULLARY (IM) NAIL INTERTROCHANTRIC, RIGHT;  Surgeon: Samson Frederic, MD;  Location: [facility] ORS;  Service: Orthopedics;  Laterality: Right;   LEFT HEART CATH AND CORONARY ANGIOGRAPHY N/A 01/15/2023   Procedure: LEFT HEART CATH AND CORONARY ANGIOGRAPHY;  Surgeon: Orbie Pyo, MD;  Location: [facility] INVASIVE CV LAB;  Service: Cardiovascular;  Laterality: N/A;   LOWER EXTREMITY ANGIOGRAPHY Bilateral 01/18/2023   Procedure: Lower Extremity Angiography;  Surgeon: Nada Libman, MD;  Location: [facility] INVASIVE CV LAB;  Service: Cardiovascular;  Laterality: Bilateral;   MELANOMA EXCISION WITH SENTINEL LYMPH NODE BIOPSY Right 08/19/2020   Procedure: WIDE LOCAL EXCISION RIGHT ELBOW MELANOMA WITH RIGHT AXILLARY SENTINEL LYMPH NODE BIOPSY;  Surgeon: Fritzi Mandes, MD;  Location: [facility] OR;  Service: General;  Laterality: Right;  2nd incision in axilla   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   Procedure: TOTAL HIP ARTHROPLASTY  ANTERIOR APPROACH;  Surgeon: Samson Frederic, MD;  Location: [facility] ORS;  Service: Orthopedics;  Laterality: Left;   TOTAL HIP ARTHROPLASTY Left 08/24/2022   Procedure: OPEN REDUCTION, HEAD AND LINER EXCHANGE;  Surgeon: Samson Frederic, MD;  Location: [facility] ORS;  Service: Orthopedics;  Laterality: Left;    Allergies  No Known Allergies   History of Present Illness    Micheal Moore.  is a *** year old ***with a PMH of      Since last being seen in the office patient reports***.  Patient denies chest pain, palpitations, dyspnea, PND, orthopnea, nausea, vomiting, dizziness, syncope, edema, weight gain, or early satiety.     ***Notes:  Home Medications    Current Outpatient Medications  Medication Sig Dispense Refill   aspirin EC 81 MG tablet Take 1 tablet (81 mg total) by mouth daily. Swallow whole. 30 tablet 12   atorvastatin (LIPITOR) 80 MG tablet Take 1 tablet (80 mg total) by mouth daily. 30 tablet 0   Blood Glucose Monitoring Suppl DEVI 1 each by Does not apply route 3 (three) times daily. May dispense any manufacturer covered by patient's insurance. 1 each 0   Ensure Max Protein (ENSURE MAX PROTEIN) LIQD Take 330 mLs (11 oz total) by mouth 2 (two) times daily. (Patient taking differently: Take 11 oz by mouth daily.)     fludrocortisone (FLORINEF) 0.1 MG tablet Take 2 tablets (0.2 mg total) by mouth daily. 30 tablet 0   Glucagon, rDNA, (GLUCAGON EMERGENCY) 1 MG KIT Inject 1 mg into the skin as needed  for up to 2 doses (Severe low blood sugar). 1 kit 0   Glucose Blood (BLOOD GLUCOSE TEST STRIPS) STRP 1 each by Does not apply route 3 (three) times daily. Use as directed to check blood sugar. May dispense any manufacturer covered by patient's insurance and fits patient's device. 100 strip 0   insulin aspart (NOVOLOG) 100 UNIT/ML FlexPen Inject 8 Units into the skin 3 (three) times daily with meals. Only take if eating a meal AND Blood Glucose (BG) is 80 or higher. 15 mL 0    insulin glargine (LANTUS) 100 UNIT/ML Solostar Pen Inject 35 Units into the skin at bedtime. May substitute as needed per insurance. 10.5 mL 0   Insulin Pen Needle (PEN NEEDLES) 31G X 5 MM MISC 1 each by Does not apply route 3 (three) times daily. May dispense any manufacturer covered by patient's insurance. 100 each 0   Lancet Device MISC 1 each by Does not apply route 3 (three) times daily. May dispense any manufacturer covered by patient's insurance. 1 each 0   Lancets MISC 1 each by Does not apply route 3 (three) times daily. Use as directed to check blood sugar. May dispense any manufacturer covered by patient's insurance and fits patient's device. 100 each 0   midodrine (PROAMATINE) 5 MG tablet Take 0.5 tablets (2.5 mg total) by mouth 3 (three) times daily with meals. (Patient taking differently: Take 7.5 mg by mouth 3 (three) times daily with meals.)     polyethylene glycol (MIRALAX / GLYCOLAX) 17 g packet Take 17 g by mouth daily. (Patient taking differently: Take 17 g by mouth daily as needed for mild constipation.) 14 each 0   ticagrelor (BRILINTA) 90 MG TABS tablet Take 1 tablet (90 mg total) by mouth 2 (two) times daily. 60 tablet 0   No current facility-administered medications for this visit.     Review of Systems  Please see the history of present illness.    (+)*** (+)***  All other systems reviewed and are otherwise negative except as noted above.  Physical Exam    Wt Readings from Last 3 Encounters:  01/20/23 179 lb 10.8 oz (81.5 kg)  10/01/22 190 lb (86.2 kg)  08/23/22 176 lb 2.4 oz (79.9 kg)   ZO:XWRUE were no vitals filed for this visit.,There is no height or weight on file to calculate BMI.  Constitutional:      Appearance: Healthy appearance. Not in distress.  Neck:     Vascular: JVD normal.  Pulmonary:     Effort: Pulmonary effort is normal.     Breath sounds: No wheezing. No rales. Diminished in the bases Cardiovascular:     Normal rate. Regular rhythm.  Normal S1. Normal S2.      Murmurs: There is no murmur.  Edema:    Peripheral edema absent.  Abdominal:     Palpations: Abdomen is soft non tender. There is no hepatomegaly.  Skin:    General: Skin is warm and dry.  Neurological:     General: No focal deficit present.     Mental Status: Alert and oriented to person, place and time.     Cranial Nerves: Cranial nerves are intact.  EKG/LABS/ Recent Cardiac Studies    ECG personally reviewed by me today - ***  Cardiac Studies & Procedures   CARDIAC CATHETERIZATION  CARDIAC CATHETERIZATION 01/15/2023  Narrative   Prox RCA lesion is 80% stenosed.   Prox LAD lesion is 40% stenosed.   A stent was successfully placed.  Post intervention, there is a 0% residual stenosis.  1.  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 2.  LVEDP of 2 mmHg; the patient received a 250 cc normal saline bolus.  Recommendation: Dual antiplatelet therapy for at least 1 year, echocardiogram, and ICU management of hyperglycemia.  The results were reviewed with the patient's spouse and Dr. Michae Kava.  Findings Coronary Findings Diagnostic  Dominance: Right  Left Anterior Descending The vessel exhibits minimal luminal irregularities. Prox LAD lesion is 40% stenosed.  First Diagonal Branch There is mild disease in the vessel.  Right Coronary Artery Prox RCA lesion is 80% stenosed.  Intervention  Prox RCA lesion Stent A stent was successfully placed. Post-Intervention Lesion Assessment The intervention was successful. Pre-interventional TIMI flow is 3. Post-intervention TIMI flow is 3. There is a 0% residual stenosis post intervention.     ECHOCARDIOGRAM  ECHOCARDIOGRAM COMPLETE 01/16/2023  Narrative ECHOCARDIOGRAM REPORT    Patient Name:   Micheal Moore. Date of Exam: 01/16/2023 Medical Rec #:  [id number]                 Height:       77.0 in Accession #:     [id number]                Weight:       185.6 lb Date of Birth:  1951/01/17                 BSA:          2.167 m Patient Age:    72 years                  BP:           135/73 mmHg Patient Gender: M                         HR:           81 bpm. Exam Location:  Inpatient  Procedure: 2D Echo, Cardiac Doppler, Color Doppler and Intracardiac Opacification Agent  Indications:    122-I22.9 Subsequent ST elevation (STEM) and non-ST elevation (NSTEMI) myocardial infarction  History:        Patient has prior history of Echocardiogram examinations, most recent 12/19/2021. Acute MI and CAD, Signs/Symptoms:Hypotension and Syncope; Risk Factors:Diabetes and Former Smoker.  Sonographer:    Sheralyn Boatman RDCS Referring Phys: [id number] Charlies Constable [facility]   Sonographer Comments: Technically difficult study due to poor echo windows and suboptimal parasternal window. IMPRESSIONS   1. Left ventricular ejection fraction, by estimation, is 50 to 55%. The left ventricle has low normal function. The left ventricle has no regional wall motion abnormalities. There is mild concentric left ventricular hypertrophy. Left ventricular diastolic parameters are consistent with Grade I diastolic dysfunction (impaired relaxation). 2. Right ventricular systolic function is normal. The right ventricular size is normal. 3. The mitral valve is degenerative. No evidence of mitral valve regurgitation. 4. The aortic valve was not well visualized. Aortic valve regurgitation is not visualized. No aortic stenosis is present. 5. The inferior vena cava is normal in size with greater than 50% respiratory variability, suggesting right atrial pressure of 3 mmHg.  Comparison(s): No significant change from prior study.  FINDINGS Left Ventricle: Left ventricular ejection fraction, by estimation, is 50 to 55%. The left ventricle has low normal function. The left ventricle has no regional wall motion abnormalities. Definity  contrast agent was  given IV to delineate the left ventricular endocardial borders. The left ventricular internal cavity size was normal in size. There is mild concentric left ventricular hypertrophy. Left ventricular diastolic parameters are consistent with Grade I diastolic dysfunction (impaired relaxation).  Right Ventricle: The right ventricular size is normal. No increase in right ventricular wall thickness. Right ventricular systolic function is normal.  Left Atrium: Left atrial size was normal in size.  Right Atrium: Right atrial size was normal in size.  Pericardium: There is no evidence of pericardial effusion.  Mitral Valve: Calcified leaflet tip. The mitral valve is degenerative in appearance. No evidence of mitral valve regurgitation.  Tricuspid Valve: The tricuspid valve is normal in structure. Tricuspid valve regurgitation is not demonstrated.  Aortic Valve: The aortic valve was not well visualized. Aortic valve regurgitation is not visualized. No aortic stenosis is present.  Pulmonic Valve: The pulmonic valve was not well visualized. Pulmonic valve regurgitation is not visualized.  Aorta: The aortic root is normal in size and structure.  Venous: The inferior vena cava is normal in size with greater than 50% respiratory variability, suggesting right atrial pressure of 3 mmHg.  IAS/Shunts: The atrial septum is grossly normal.   LEFT VENTRICLE PLAX 2D LVIDd:         4.70 cm     Diastology LVIDs:         3.50 cm     LV e' medial:    4.13 cm/s LV PW:         1.10 cm     LV E/e' medial:  14.1 LV IVS:        1.20 cm     LV e' lateral:   7.51 cm/s LVOT diam:     2.50 cm     LV E/e' lateral: 7.8 LV SV:         81 LV SV Index:   38 LVOT Area:     4.91 cm  LV Volumes (MOD) LV vol d, MOD A2C: 75.7 ml LV vol d, MOD A4C: 77.4 ml LV vol s, MOD A2C: 28.9 ml LV vol s, MOD A4C: 36.9 ml LV SV MOD A2C:     46.8 ml LV SV MOD A4C:     77.4 ml LV SV MOD BP:      47.6 ml  RIGHT VENTRICLE              IVC RV S prime:     10.40 cm/s  IVC diam: 1.70 cm TAPSE (M-mode): 1.4 cm  LEFT ATRIUM           Index        RIGHT ATRIUM           Index LA diam:      3.00 cm 1.38 cm/m   RA Area:     16.80 cm LA Vol (A2C): 21.8 ml 10.06 ml/m  RA Volume:   41.90 ml  19.34 ml/m LA Vol (A4C): 25.0 ml 11.54 ml/m AORTIC VALVE LVOT Vmax:   85.50 cm/s LVOT Vmean:  54.400 cm/s LVOT VTI:    0.166 m  AORTA Ao Root diam: 3.70 cm  MITRAL VALVE MV Area (PHT): 3.53 cm    SHUNTS MV Decel Time: 215 msec    Systemic VTI:  0.17 m MV E velocity: 58.30 cm/s  Systemic Diam: 2.50 cm MV A velocity: 70.70 cm/s MV E/A ratio:  0.82  Riley Lam MD Electronically signed by Riley Lam MD Signature Date/Time: 01/16/2023/11:53:08 AM  Final             Risk Assessment/Calculations:   {Does this patient have ATRIAL FIBRILLATION?:[phone]}        Lab Results  Component Value Date   WBC 6.6 01/18/2023   HGB 10.8 (L) 01/18/2023   HCT 33.1 (L) 01/18/2023   MCV 93.0 01/18/2023   PLT 225 01/18/2023   Lab Results  Component Value Date   CREATININE 0.88 01/18/2023   BUN 15 01/18/2023   NA 132 (L) 01/18/2023   K 4.3 01/18/2023   CL 100 01/18/2023   CO2 26 01/18/2023   Lab Results  Component Value Date   ALT 11 01/15/2023   AST 20 01/15/2023   ALKPHOS 103 01/15/2023   BILITOT 1.1 01/15/2023   Lab Results  Component Value Date   CHOL 122 01/19/2023   HDL 37 (L) 01/19/2023   LDLCALC 66 01/19/2023   TRIG 95 01/19/2023   CHOLHDL 3.3 01/19/2023    Lab Results  Component Value Date   HGBA1C 13.3 (H) 01/15/2023     Assessment & Plan    1.***  2.***  3.***  4.***      Disposition: Follow-up with Dietrich Pates, MD or APP in *** months {Are you ordering a CV Procedure (e.g. stress test, cath, DCCV, TEE, etc)?   Press F2        :[id number]}   Medication Adjustments/Labs and Tests Ordered: Current medicines are reviewed at length with the patient today.  Concerns  regarding medicines are outlined above.   Signed, Napoleon Form, Leodis Rains, NP 01/30/2023, 4:46 PM [facility]

## 2023-01-31 ENCOUNTER — Ambulatory Visit: Payer: Medicare HMO | Admitting: Dietician

## 2023-02-01 ENCOUNTER — Ambulatory Visit: Payer: Medicare HMO | Admitting: Nurse Practitioner

## 2023-02-01 ENCOUNTER — Telehealth: Payer: Self-pay

## 2023-02-01 NOTE — Patient Outreach (Signed)
  Care Coordination   02/01/2023 Name: Dagen [place]. MRN: [id number] DOB: [birth year]   Care Coordination Outreach Attempts:  An unsuccessful telephone outreach was attempted for a scheduled appointment today.  Follow Up Plan:  Additional outreach attempts will be made to offer the patient care coordination information and services.   Encounter Outcome:  No Answer   Care Coordination Interventions:  No, not indicated    Bevelyn Ngo, BSW, CDP Social Worker, Certified Dementia Practitioner [facility] Care Management  Care Coordination [phone]

## 2023-02-04 DIAGNOSIS — R6889 Other general symptoms and signs: Secondary | ICD-10-CM | POA: Diagnosis not present

## 2023-02-12 ENCOUNTER — Ambulatory Visit: Payer: Self-pay

## 2023-02-12 NOTE — Patient Outreach (Addendum)
  Care Coordination   02/12/2023 Name: Micheal Moore. MRN: [id number] DOB: 21-Jan-1951   Care Coordination Outreach Attempts:  An unsuccessful telephone outreach was attempted for a scheduled appointment today.  Follow Up Plan:  Additional outreach attempts will be made to offer the patient care coordination information and services.   Encounter Outcome:  Pt. Request to Call Back   Care Coordination Interventions:  No, not indicated    Delsa Sale, RN, BSN, CCM Care Management Coordinator [facility] Care Management Direct Phone: [phone]

## 2023-02-25 ENCOUNTER — Ambulatory Visit: Payer: Self-pay

## 2023-02-25 NOTE — Patient Outreach (Signed)
  Care Coordination   Initial Visit Note   02/25/2023 Name: Micheal Moore. MRN: [id number] DOB: 11-Jul-1951  Micheal Moore. is a 72 y.o. year old male who sees [place], [name], MD for primary care. I spoke with wife Micheal Moore by phone today.  What matters to the patients health and wellness today?  Patient and wife refuse services offered by [facility] Care Management at this time.     Goals Addressed             This Visit's Progress    COMPLETED: RN Care Coordination Activities: further follow up needed       Care Coordination Interventions: Completed outbound call with patient's wife Micheal Moore Per Mrs. Butt the patient does not wish to participate in the [facility] Care Coordination program  No further follow up needed         SDOH assessments and interventions completed:  No     Care Coordination Interventions:  Yes, provided   Follow up plan: No further intervention required.   Encounter Outcome:  Pt. Visit Completed

## 2023-03-25 ENCOUNTER — Ambulatory Visit: Payer: Medicare HMO | Admitting: Registered"

## 2023-04-10 ENCOUNTER — Emergency Department (HOSPITAL_COMMUNITY): Payer: Medicare HMO

## 2023-04-10 ENCOUNTER — Inpatient Hospital Stay (HOSPITAL_COMMUNITY)
Admission: EM | Admit: 2023-04-10 | Discharge: 2023-04-22 | DRG: 178 | Disposition: A | Discharge status: Home Health | Payer: Medicare HMO | Attending: Internal Medicine | Admitting: Internal Medicine

## 2023-04-10 DIAGNOSIS — J189 Pneumonia, unspecified organism: Secondary | ICD-10-CM | POA: Diagnosis not present

## 2023-04-10 DIAGNOSIS — Z89412 Acquired absence of left great toe: Secondary | ICD-10-CM

## 2023-04-10 DIAGNOSIS — Z833 Family history of diabetes mellitus: Secondary | ICD-10-CM

## 2023-04-10 DIAGNOSIS — Z1152 Encounter for screening for COVID-19: Secondary | ICD-10-CM

## 2023-04-10 DIAGNOSIS — R112 Nausea with vomiting, unspecified: Secondary | ICD-10-CM | POA: Diagnosis present

## 2023-04-10 DIAGNOSIS — T8489XS Other specified complication of internal orthopedic prosthetic devices, implants and grafts, sequela: Secondary | ICD-10-CM

## 2023-04-10 DIAGNOSIS — Z79899 Other long term (current) drug therapy: Secondary | ICD-10-CM

## 2023-04-10 DIAGNOSIS — Z85820 Personal history of malignant melanoma of skin: Secondary | ICD-10-CM

## 2023-04-10 DIAGNOSIS — R7989 Other specified abnormal findings of blood chemistry: Secondary | ICD-10-CM | POA: Diagnosis present

## 2023-04-10 DIAGNOSIS — F408 Other phobic anxiety disorders: Secondary | ICD-10-CM | POA: Diagnosis present

## 2023-04-10 DIAGNOSIS — J690 Pneumonitis due to inhalation of food and vomit: Principal | ICD-10-CM | POA: Diagnosis present

## 2023-04-10 DIAGNOSIS — Z7982 Long term (current) use of aspirin: Secondary | ICD-10-CM

## 2023-04-10 DIAGNOSIS — F17210 Nicotine dependence, cigarettes, uncomplicated: Secondary | ICD-10-CM | POA: Diagnosis present

## 2023-04-10 DIAGNOSIS — Z7952 Long term (current) use of systemic steroids: Secondary | ICD-10-CM

## 2023-04-10 DIAGNOSIS — E876 Hypokalemia: Secondary | ICD-10-CM | POA: Diagnosis present

## 2023-04-10 DIAGNOSIS — Z91119 Patient's noncompliance with dietary regimen due to unspecified reason: Secondary | ICD-10-CM

## 2023-04-10 DIAGNOSIS — Z96642 Presence of left artificial hip joint: Secondary | ICD-10-CM | POA: Diagnosis present

## 2023-04-10 DIAGNOSIS — I951 Orthostatic hypotension: Secondary | ICD-10-CM | POA: Diagnosis present

## 2023-04-10 DIAGNOSIS — R5381 Other malaise: Secondary | ICD-10-CM | POA: Insufficient documentation

## 2023-04-10 DIAGNOSIS — E1165 Type 2 diabetes mellitus with hyperglycemia: Secondary | ICD-10-CM | POA: Diagnosis present

## 2023-04-10 DIAGNOSIS — R262 Difficulty in walking, not elsewhere classified: Secondary | ICD-10-CM | POA: Diagnosis present

## 2023-04-10 DIAGNOSIS — L89620 Pressure ulcer of left heel, unstageable: Secondary | ICD-10-CM | POA: Diagnosis present

## 2023-04-10 DIAGNOSIS — Z86718 Personal history of other venous thrombosis and embolism: Secondary | ICD-10-CM

## 2023-04-10 DIAGNOSIS — I739 Peripheral vascular disease, unspecified: Secondary | ICD-10-CM | POA: Diagnosis present

## 2023-04-10 DIAGNOSIS — R7881 Bacteremia: Secondary | ICD-10-CM

## 2023-04-10 DIAGNOSIS — I252 Old myocardial infarction: Secondary | ICD-10-CM

## 2023-04-10 DIAGNOSIS — Z823 Family history of stroke: Secondary | ICD-10-CM

## 2023-04-10 DIAGNOSIS — E785 Hyperlipidemia, unspecified: Secondary | ICD-10-CM | POA: Diagnosis present

## 2023-04-10 DIAGNOSIS — Z955 Presence of coronary angioplasty implant and graft: Secondary | ICD-10-CM

## 2023-04-10 DIAGNOSIS — Z7401 Bed confinement status: Secondary | ICD-10-CM

## 2023-04-10 DIAGNOSIS — Y831 Surgical operation with implant of artificial internal device as the cause of abnormal reaction of the patient, or of later complication, without mention of misadventure at the time of the procedure: Secondary | ICD-10-CM | POA: Diagnosis present

## 2023-04-10 DIAGNOSIS — I9581 Postprocedural hypotension: Secondary | ICD-10-CM | POA: Diagnosis not present

## 2023-04-10 DIAGNOSIS — E1151 Type 2 diabetes mellitus with diabetic peripheral angiopathy without gangrene: Secondary | ICD-10-CM | POA: Diagnosis present

## 2023-04-10 DIAGNOSIS — B9562 Methicillin resistant Staphylococcus aureus infection as the cause of diseases classified elsewhere: Secondary | ICD-10-CM | POA: Diagnosis present

## 2023-04-10 DIAGNOSIS — Z8739 Personal history of other diseases of the musculoskeletal system and connective tissue: Secondary | ICD-10-CM

## 2023-04-10 DIAGNOSIS — D649 Anemia, unspecified: Secondary | ICD-10-CM | POA: Diagnosis present

## 2023-04-10 DIAGNOSIS — I2510 Atherosclerotic heart disease of native coronary artery without angina pectoris: Secondary | ICD-10-CM | POA: Diagnosis present

## 2023-04-10 DIAGNOSIS — E1169 Type 2 diabetes mellitus with other specified complication: Secondary | ICD-10-CM | POA: Diagnosis present

## 2023-04-10 DIAGNOSIS — L8990 Pressure ulcer of unspecified site, unspecified stage: Secondary | ICD-10-CM | POA: Diagnosis present

## 2023-04-10 DIAGNOSIS — Z794 Long term (current) use of insulin: Secondary | ICD-10-CM

## 2023-04-10 LAB — CBC WITH DIFFERENTIAL/PLATELET
Abs Immature Granulocytes: 0.04 10*3/uL (ref 0.00–0.07)
Basophils Absolute: 0 10*3/uL (ref 0.0–0.1)
Basophils Relative: 0 %
Eosinophils Absolute: 0 10*3/uL (ref 0.0–0.5)
Eosinophils Relative: 0 %
HCT: 39 % (ref 39.0–52.0)
Hemoglobin: 12.7 g/dL — ABNORMAL LOW (ref 13.0–17.0)
Immature Granulocytes: 0 %
Lymphocytes Relative: 11 %
Lymphs Abs: 1 10*3/uL (ref 0.7–4.0)
MCH: 30.8 pg (ref 26.0–34.0)
MCHC: 32.6 g/dL (ref 30.0–36.0)
MCV: 94.7 fL (ref 80.0–100.0)
Monocytes Absolute: 0.3 10*3/uL (ref 0.1–1.0)
Monocytes Relative: 4 %
Neutro Abs: 7.6 10*3/uL (ref 1.7–7.7)
Neutrophils Relative %: 85 %
Platelets: 272 10*3/uL (ref 150–400)
RBC: 4.12 MIL/uL — ABNORMAL LOW (ref 4.22–5.81)
RDW: 13.5 % (ref 11.5–15.5)
WBC: 9 10*3/uL (ref 4.0–10.5)
nRBC: 0 % (ref 0.0–0.2)

## 2023-04-10 LAB — COMPREHENSIVE METABOLIC PANEL
ALT: 12 U/L (ref 0–44)
AST: 14 U/L — ABNORMAL LOW (ref 15–41)
Albumin: 3.3 g/dL — ABNORMAL LOW (ref 3.5–5.0)
Alkaline Phosphatase: 76 U/L (ref 38–126)
Anion gap: 13 (ref 5–15)
BUN: 18 mg/dL (ref 8–23)
CO2: 21 mmol/L — ABNORMAL LOW (ref 22–32)
Calcium: 8.7 mg/dL — ABNORMAL LOW (ref 8.9–10.3)
Chloride: 99 mmol/L (ref 98–111)
Creatinine, Ser: 0.72 mg/dL (ref 0.61–1.24)
GFR, Estimated: >60 mL/min (ref >60)
Glucose, Bld: 119 mg/dL — ABNORMAL HIGH (ref 70–99)
Potassium: 3.1 mmol/L — ABNORMAL LOW (ref 3.5–5.1)
Sodium: 133 mmol/L — ABNORMAL LOW (ref 135–145)
Total Bilirubin: 1.4 mg/dL — ABNORMAL HIGH (ref 0.3–1.2)
Total Protein: 7 g/dL (ref 6.5–8.1)

## 2023-04-10 LAB — TROPONIN I (HIGH SENSITIVITY): Troponin I (High Sensitivity): 14 ng/L (ref <18)

## 2023-04-10 LAB — I-STAT CG4 LACTIC ACID, ED: Lactic Acid, Venous: 1.1 mmol/L (ref 0.5–1.9)

## 2023-04-10 LAB — LIPASE, BLOOD: Lipase: 23 U/L (ref 11–51)

## 2023-04-10 MED ORDER — ONDANSETRON HCL 4 MG/2ML IJ SOLN
4.0000 mg | Freq: Once | INTRAMUSCULAR | Status: AC
  Administered 2023-04-10: 4 mg via INTRAVENOUS
  Filled 2023-04-10: qty 2

## 2023-04-10 NOTE — ED Triage Notes (Signed)
Pt BIB EMS for couple episodes of vomiting onset tonight. Pt denies pain. Has also had new cough for the last 2 days. EMS report temp of 100.5 en route, given 650 mg Tylenol. Initial sats 89-90% with EMS.

## 2023-04-10 NOTE — ED Provider Notes (Signed)
[place] EMERGENCY DEPARTMENT AT [facility] Provider Note   CSN: [id number] Arrival date & time: 04/10/23  2205     History {Add pertinent medical, surgical, social history, OB history to HPI:1} Chief Complaint  Patient presents with   Emesis   Cough    Micheal Moore. is a 72 y.o. male.  The history is provided by the patient and medical records.  Emesis Associated symptoms: cough and fever   Cough Associated symptoms: fever    72 y.o. M with hx of HLP, DVT, DM2, CHF (EF 50-55% on last echo), presenting to the ED for vomiting and SOB that began tonight.  Has had a cough for the past 2 days.  Fever noted with EMS up to 100.17F, given tylenol.  Sats 89-90% with EMS, does not use home O2.  He denies any sick contacts.  Denies any pain or other complaints currently.  Home Medications Prior to Admission medications   Medication Sig Start Date End Date Taking? Authorizing Provider  aspirin EC 81 MG tablet Take 1 tablet (81 mg total) by mouth daily. Swallow whole. 01/20/23   Lanae Boast, MD  atorvastatin (LIPITOR) 80 MG tablet Take 1 tablet (80 mg total) by mouth daily. 01/21/23 02/20/23  Lanae Boast, MD  Blood Glucose Monitoring Suppl DEVI 1 each by Does not apply route 3 (three) times daily. May dispense any manufacturer covered by patient's insurance. 01/20/23   Lanae Boast, MD  Ensure Max Protein (ENSURE MAX PROTEIN) LIQD Take 330 mLs (11 oz total) by mouth 2 (two) times daily. Patient taking differently: Take 11 oz by mouth daily. 08/28/22   Lorin Glass, MD  fludrocortisone (FLORINEF) 0.1 MG tablet Take 2 tablets (0.2 mg total) by mouth daily. 03/05/22   Rolly Salter, MD  Glucagon, rDNA, (GLUCAGON EMERGENCY) 1 MG KIT Inject 1 mg into the skin as needed for up to 2 doses (Severe low blood sugar). 01/20/23   Lanae Boast, MD  Glucose Blood (BLOOD GLUCOSE TEST STRIPS) STRP 1 each by Does not apply route 3 (three) times daily. Use as directed to check blood sugar. May  dispense any manufacturer covered by patient's insurance and fits patient's device. 01/20/23   Lanae Boast, MD  insulin aspart (NOVOLOG) 100 UNIT/ML FlexPen Inject 8 Units into the skin 3 (three) times daily with meals. Only take if eating a meal AND Blood Glucose (BG) is 80 or higher. 01/20/23 02/19/23  Lanae Boast, MD  insulin glargine (LANTUS) 100 UNIT/ML Solostar Pen Inject 35 Units into the skin at bedtime. May substitute as needed per insurance. 01/20/23 02/19/23  Lanae Boast, MD  Insulin Pen Needle (PEN NEEDLES) 31G X 5 MM MISC 1 each by Does not apply route 3 (three) times daily. May dispense any manufacturer covered by patient's insurance. 01/20/23   Lanae Boast, MD  Lancet Device MISC 1 each by Does not apply route 3 (three) times daily. May dispense any manufacturer covered by patient's insurance. 01/20/23   Lanae Boast, MD  Lancets MISC 1 each by Does not apply route 3 (three) times daily. Use as directed to check blood sugar. May dispense any manufacturer covered by patient's insurance and fits patient's device. 01/20/23   Lanae Boast, MD  midodrine (PROAMATINE) 5 MG tablet Take 0.5 tablets (2.5 mg total) by mouth 3 (three) times daily with meals. Patient taking differently: Take 7.5 mg by mouth 3 (three) times daily with meals. 07/25/22   Rodolph Bong, MD  polyethylene glycol [facility] /  GLYCOLAX) 17 g packet Take 17 g by mouth daily. Patient taking differently: Take 17 g by mouth daily as needed for mild constipation. 03/05/22   Rolly Salter, MD      Allergies    Patient has no known allergies.    Review of Systems   Review of Systems  Constitutional:  Positive for fever.  Respiratory:  Positive for cough.   Gastrointestinal:  Positive for nausea and vomiting.  All other systems reviewed and are negative.   Physical Exam Updated Vital Signs BP (!) 148/69 (BP Location: Right Arm)   Pulse (!) 107   Temp 98.9 F (37.2 C) (Oral)   Resp 16   Wt 94.8 kg   SpO2 91%   BMI 24.78 kg/m    Physical Exam Vitals and nursing note reviewed.  Constitutional:      Appearance: He is well-developed.  HENT:     Head: Normocephalic and atraumatic.  Eyes:     Conjunctiva/sclera: Conjunctivae normal.     Pupils: Pupils are equal, round, and reactive to light.  Cardiovascular:     Rate and Rhythm: Normal rate and regular rhythm.     Heart sounds: Normal heart sounds.  Pulmonary:     Effort: Pulmonary effort is normal.     Breath sounds: Examination of the right-upper field reveals rhonchi. Rhonchi present.  Abdominal:     General: Bowel sounds are normal.     Palpations: Abdomen is soft.  Musculoskeletal:        General: Normal range of motion.     Cervical back: Normal range of motion.  Skin:    General: Skin is warm and dry.  Neurological:     Mental Status: He is alert and oriented to person, place, and time.     ED Results / Procedures / Treatments   Labs (all labs ordered are listed, but only abnormal results are displayed) Labs Reviewed  CULTURE, BLOOD (ROUTINE X 2)  CULTURE, BLOOD (ROUTINE X 2)  CBC WITH DIFFERENTIAL/PLATELET  COMPREHENSIVE METABOLIC PANEL  LIPASE, BLOOD  BRAIN NATRIURETIC PEPTIDE  I-STAT CG4 LACTIC ACID, ED  TROPONIN I (HIGH SENSITIVITY)    EKG None  Radiology No results found.  Procedures Procedures  {Document cardiac monitor, telemetry assessment procedure when appropriate:1}  Medications Ordered in ED Medications  ondansetron (ZOFRAN) injection 4 mg (has no administration in time range)    ED Course/ Medical Decision Making/ A&P   {   Click here for ABCD2, HEART and other calculatorsREFRESH Note before signing :1}                          Medical Decision Making Amount and/or Complexity of Data Reviewed Labs: ordered. Radiology: ordered.  Risk Prescription drug management.   ***  {Document critical care time when appropriate:1} {Document review of labs and clinical decision tools ie heart score, Chads2Vasc2  etc:1}  {Document your independent review of radiology images, and any outside records:1} {Document your discussion with family members, caretakers, and with consultants:1} {Document social determinants of health affecting pt's care:1} {Document your decision making why or why not admission, treatments were needed:1} Final Clinical Impression(s) / ED Diagnoses Final diagnoses:  None    Rx / DC Orders ED Discharge Orders     None

## 2023-04-11 DIAGNOSIS — Z86718 Personal history of other venous thrombosis and embolism: Secondary | ICD-10-CM

## 2023-04-11 DIAGNOSIS — F17210 Nicotine dependence, cigarettes, uncomplicated: Secondary | ICD-10-CM | POA: Diagnosis present

## 2023-04-11 DIAGNOSIS — S8992XA Unspecified injury of left lower leg, initial encounter: Secondary | ICD-10-CM | POA: Diagnosis not present

## 2023-04-11 DIAGNOSIS — J690 Pneumonitis due to inhalation of food and vomit: Secondary | ICD-10-CM | POA: Diagnosis present

## 2023-04-11 DIAGNOSIS — Z79899 Other long term (current) drug therapy: Secondary | ICD-10-CM | POA: Diagnosis not present

## 2023-04-11 DIAGNOSIS — R7989 Other specified abnormal findings of blood chemistry: Secondary | ICD-10-CM

## 2023-04-11 DIAGNOSIS — E119 Type 2 diabetes mellitus without complications: Secondary | ICD-10-CM | POA: Diagnosis not present

## 2023-04-11 DIAGNOSIS — I2510 Atherosclerotic heart disease of native coronary artery without angina pectoris: Secondary | ICD-10-CM

## 2023-04-11 DIAGNOSIS — R112 Nausea with vomiting, unspecified: Secondary | ICD-10-CM | POA: Diagnosis present

## 2023-04-11 DIAGNOSIS — I959 Hypotension, unspecified: Secondary | ICD-10-CM | POA: Diagnosis not present

## 2023-04-11 DIAGNOSIS — I252 Old myocardial infarction: Secondary | ICD-10-CM | POA: Diagnosis not present

## 2023-04-11 DIAGNOSIS — E785 Hyperlipidemia, unspecified: Secondary | ICD-10-CM | POA: Diagnosis present

## 2023-04-11 DIAGNOSIS — I9581 Postprocedural hypotension: Secondary | ICD-10-CM | POA: Diagnosis not present

## 2023-04-11 DIAGNOSIS — Z91119 Patient's noncompliance with dietary regimen due to unspecified reason: Secondary | ICD-10-CM | POA: Diagnosis not present

## 2023-04-11 DIAGNOSIS — R509 Fever, unspecified: Secondary | ICD-10-CM | POA: Diagnosis not present

## 2023-04-11 DIAGNOSIS — J189 Pneumonia, unspecified organism: Secondary | ICD-10-CM | POA: Diagnosis present

## 2023-04-11 DIAGNOSIS — E1151 Type 2 diabetes mellitus with diabetic peripheral angiopathy without gangrene: Secondary | ICD-10-CM | POA: Diagnosis present

## 2023-04-11 DIAGNOSIS — Z7952 Long term (current) use of systemic steroids: Secondary | ICD-10-CM | POA: Diagnosis not present

## 2023-04-11 DIAGNOSIS — E876 Hypokalemia: Secondary | ICD-10-CM

## 2023-04-11 DIAGNOSIS — D649 Anemia, unspecified: Secondary | ICD-10-CM

## 2023-04-11 DIAGNOSIS — B9562 Methicillin resistant Staphylococcus aureus infection as the cause of diseases classified elsewhere: Secondary | ICD-10-CM | POA: Diagnosis present

## 2023-04-11 DIAGNOSIS — I739 Peripheral vascular disease, unspecified: Secondary | ICD-10-CM | POA: Diagnosis present

## 2023-04-11 DIAGNOSIS — Y831 Surgical operation with implant of artificial internal device as the cause of abnormal reaction of the patient, or of later complication, without mention of misadventure at the time of the procedure: Secondary | ICD-10-CM | POA: Diagnosis present

## 2023-04-11 DIAGNOSIS — E1169 Type 2 diabetes mellitus with other specified complication: Secondary | ICD-10-CM | POA: Diagnosis present

## 2023-04-11 DIAGNOSIS — B9689 Other specified bacterial agents as the cause of diseases classified elsewhere: Secondary | ICD-10-CM

## 2023-04-11 DIAGNOSIS — Z955 Presence of coronary angioplasty implant and graft: Secondary | ICD-10-CM | POA: Diagnosis not present

## 2023-04-11 DIAGNOSIS — I2583 Coronary atherosclerosis due to lipid rich plaque: Secondary | ICD-10-CM

## 2023-04-11 DIAGNOSIS — T8489XS Other specified complication of internal orthopedic prosthetic devices, implants and grafts, sequela: Secondary | ICD-10-CM | POA: Diagnosis not present

## 2023-04-11 DIAGNOSIS — A4902 Methicillin resistant Staphylococcus aureus infection, unspecified site: Secondary | ICD-10-CM | POA: Diagnosis not present

## 2023-04-11 DIAGNOSIS — I5032 Chronic diastolic (congestive) heart failure: Secondary | ICD-10-CM | POA: Diagnosis not present

## 2023-04-11 DIAGNOSIS — I951 Orthostatic hypotension: Secondary | ICD-10-CM

## 2023-04-11 DIAGNOSIS — L89629 Pressure ulcer of left heel, unspecified stage: Secondary | ICD-10-CM

## 2023-04-11 DIAGNOSIS — R5381 Other malaise: Secondary | ICD-10-CM

## 2023-04-11 DIAGNOSIS — Z1152 Encounter for screening for COVID-19: Secondary | ICD-10-CM | POA: Diagnosis not present

## 2023-04-11 DIAGNOSIS — E1165 Type 2 diabetes mellitus with hyperglycemia: Secondary | ICD-10-CM | POA: Diagnosis present

## 2023-04-11 DIAGNOSIS — L89620 Pressure ulcer of left heel, unstageable: Secondary | ICD-10-CM | POA: Diagnosis present

## 2023-04-11 DIAGNOSIS — R262 Difficulty in walking, not elsewhere classified: Secondary | ICD-10-CM | POA: Diagnosis present

## 2023-04-11 DIAGNOSIS — Z7401 Bed confinement status: Secondary | ICD-10-CM | POA: Diagnosis not present

## 2023-04-11 DIAGNOSIS — Z794 Long term (current) use of insulin: Secondary | ICD-10-CM | POA: Diagnosis not present

## 2023-04-11 DIAGNOSIS — L8990 Pressure ulcer of unspecified site, unspecified stage: Secondary | ICD-10-CM | POA: Diagnosis present

## 2023-04-11 DIAGNOSIS — R7881 Bacteremia: Secondary | ICD-10-CM | POA: Diagnosis present

## 2023-04-11 HISTORY — DX: Other specified abnormal findings of blood chemistry: R79.89

## 2023-04-11 HISTORY — DX: Hypokalemia: E87.6

## 2023-04-11 LAB — BLOOD CULTURE ID PANEL (REFLEXED) - BCID2
A.calcoaceticus-baumannii: NOT DETECTED
CTX-M ESBL: NOT DETECTED
Candida albicans: NOT DETECTED
Candida auris: NOT DETECTED
Candida glabrata: NOT DETECTED
Candida krusei: NOT DETECTED
Candida tropicalis: NOT DETECTED
Carbapenem resist OXA 48 LIKE: NOT DETECTED
Carbapenem resistance IMP: NOT DETECTED
Carbapenem resistance KPC: NOT DETECTED
Carbapenem resistance NDM: NOT DETECTED
Carbapenem resistance VIM: NOT DETECTED
Cryptococcus neoformans/gattii: NOT DETECTED
Enterobacter cloacae complex: NOT DETECTED
Enterobacterales: DETECTED — AB
Enterococcus Faecium: NOT DETECTED
Enterococcus faecalis: NOT DETECTED
Escherichia coli: NOT DETECTED
Haemophilus influenzae: NOT DETECTED
Klebsiella aerogenes: NOT DETECTED
Klebsiella oxytoca: NOT DETECTED
Klebsiella pneumoniae: NOT DETECTED
Listeria monocytogenes: NOT DETECTED
Meth resistant mecA/C and MREJ: DETECTED — AB
Neisseria meningitidis: NOT DETECTED
Proteus species: NOT DETECTED
Pseudomonas aeruginosa: NOT DETECTED
Salmonella species: NOT DETECTED
Serratia marcescens: NOT DETECTED
Staphylococcus aureus (BCID): DETECTED — AB
Staphylococcus epidermidis: NOT DETECTED
Staphylococcus species: DETECTED — AB
Streptococcus agalactiae: NOT DETECTED
Streptococcus pneumoniae: NOT DETECTED
Streptococcus pyogenes: NOT DETECTED
Streptococcus species: NOT DETECTED

## 2023-04-11 LAB — COMPREHENSIVE METABOLIC PANEL
ALT: 12 U/L (ref 0–44)
AST: 14 U/L — ABNORMAL LOW (ref 15–41)
Albumin: 3.3 g/dL — ABNORMAL LOW (ref 3.5–5.0)
Alkaline Phosphatase: 74 U/L (ref 38–126)
Anion gap: 14 (ref 5–15)
BUN: 20 mg/dL (ref 8–23)
CO2: 21 mmol/L — ABNORMAL LOW (ref 22–32)
Calcium: 8.7 mg/dL — ABNORMAL LOW (ref 8.9–10.3)
Chloride: 97 mmol/L — ABNORMAL LOW (ref 98–111)
Creatinine, Ser: 0.78 mg/dL (ref 0.61–1.24)
GFR, Estimated: >60 mL/min (ref >60)
Glucose, Bld: 140 mg/dL — ABNORMAL HIGH (ref 70–99)
Potassium: 3.2 mmol/L — ABNORMAL LOW (ref 3.5–5.1)
Sodium: 132 mmol/L — ABNORMAL LOW (ref 135–145)
Total Bilirubin: 1.4 mg/dL — ABNORMAL HIGH (ref 0.3–1.2)
Total Protein: 6.7 g/dL (ref 6.5–8.1)

## 2023-04-11 LAB — CULTURE, BLOOD (ROUTINE X 2)

## 2023-04-11 LAB — CBC WITH DIFFERENTIAL/PLATELET
Abs Immature Granulocytes: 0.05 10*3/uL (ref 0.00–0.07)
Basophils Absolute: 0 10*3/uL (ref 0.0–0.1)
Basophils Relative: 0 %
Eosinophils Absolute: 0 10*3/uL (ref 0.0–0.5)
Eosinophils Relative: 0 %
HCT: 38.3 % — ABNORMAL LOW (ref 39.0–52.0)
Hemoglobin: 12.6 g/dL — ABNORMAL LOW (ref 13.0–17.0)
Immature Granulocytes: 1 %
Lymphocytes Relative: 12 %
Lymphs Abs: 1.1 10*3/uL (ref 0.7–4.0)
MCH: 30.3 pg (ref 26.0–34.0)
MCHC: 32.9 g/dL (ref 30.0–36.0)
MCV: 92.1 fL (ref 80.0–100.0)
Monocytes Absolute: 0.5 10*3/uL (ref 0.1–1.0)
Monocytes Relative: 6 %
Neutro Abs: 7.2 10*3/uL (ref 1.7–7.7)
Neutrophils Relative %: 81 %
Platelets: 212 10*3/uL (ref 150–400)
RBC: 4.16 MIL/uL — ABNORMAL LOW (ref 4.22–5.81)
RDW: 13.6 % (ref 11.5–15.5)
WBC: 8.9 10*3/uL (ref 4.0–10.5)
nRBC: 0 % (ref 0.0–0.2)

## 2023-04-11 LAB — GLUCOSE, CAPILLARY
Glucose-Capillary: 166 mg/dL — ABNORMAL HIGH (ref 70–99)
Glucose-Capillary: 172 mg/dL — ABNORMAL HIGH (ref 70–99)
Glucose-Capillary: 182 mg/dL — ABNORMAL HIGH (ref 70–99)
Glucose-Capillary: 150 mg/dL — ABNORMAL HIGH (ref 70–99)

## 2023-04-11 LAB — RESPIRATORY PANEL BY PCR

## 2023-04-11 LAB — RESP PANEL BY RT-PCR (RSV, FLU A&B, COVID)  RVPGX2
Influenza A by PCR: NEGATIVE
Influenza B by PCR: NEGATIVE
Resp Syncytial Virus by PCR: NEGATIVE
SARS Coronavirus 2 by RT PCR: NEGATIVE

## 2023-04-11 LAB — PHOSPHORUS: Phosphorus: 3.8 mg/dL (ref 2.5–4.6)

## 2023-04-11 LAB — STREP PNEUMONIAE URINARY ANTIGEN: Strep Pneumo Urinary Antigen: NEGATIVE

## 2023-04-11 LAB — BRAIN NATRIURETIC PEPTIDE: B Natriuretic Peptide: 79.6 pg/mL (ref 0.0–100.0)

## 2023-04-11 LAB — MAGNESIUM: Magnesium: 1.9 mg/dL (ref 1.7–2.4)

## 2023-04-11 LAB — TROPONIN I (HIGH SENSITIVITY)
Troponin I (High Sensitivity): 18 ng/L — ABNORMAL HIGH (ref <18)
Troponin I (High Sensitivity): 15 ng/L (ref <18)

## 2023-04-11 LAB — PROCALCITONIN: Procalcitonin: 0.4 ng/mL

## 2023-04-11 LAB — I-STAT CG4 LACTIC ACID, ED: Lactic Acid, Venous: 1.6 mmol/L (ref 0.5–1.9)

## 2023-04-11 MED ORDER — INSULIN GLARGINE-YFGN 100 UNIT/ML ~~LOC~~ SOLN
20.0000 [IU] | Freq: Every day | SUBCUTANEOUS | Status: DC
  Administered 2023-04-11 – 2023-04-13 (×3): 20 [IU] via SUBCUTANEOUS
  Filled 2023-04-11 (×4): qty 0.2

## 2023-04-11 MED ORDER — MIDODRINE HCL 5 MG PO TABS
7.5000 mg | ORAL_TABLET | Freq: Three times a day (TID) | ORAL | Status: DC
  Administered 2023-04-11 – 2023-04-22 (×30): 7.5 mg via ORAL
  Filled 2023-04-11 (×32): qty 2

## 2023-04-11 MED ORDER — TICAGRELOR 90 MG PO TABS
90.0000 mg | ORAL_TABLET | Freq: Two times a day (BID) | ORAL | Status: DC
  Administered 2023-04-11 – 2023-04-22 (×22): 90 mg via ORAL
  Filled 2023-04-11 (×23): qty 1

## 2023-04-11 MED ORDER — SODIUM CHLORIDE 0.9 % IV SOLN
1.0000 g | Freq: Once | INTRAVENOUS | Status: AC
  Administered 2023-04-11: 1 g via INTRAVENOUS
  Filled 2023-04-11: qty 10

## 2023-04-11 MED ORDER — ROSUVASTATIN CALCIUM 5 MG PO TABS
5.0000 mg | ORAL_TABLET | ORAL | Status: DC
  Administered 2023-04-11 – 2023-04-22 (×4): 5 mg via ORAL
  Filled 2023-04-11 (×4): qty 1

## 2023-04-11 MED ORDER — VANCOMYCIN HCL 2000 MG/400ML IV SOLN
2000.0000 mg | Freq: Once | INTRAVENOUS | Status: AC
  Administered 2023-04-11: 2000 mg via INTRAVENOUS
  Filled 2023-04-11: qty 400

## 2023-04-11 MED ORDER — SODIUM CHLORIDE 0.9 % IV SOLN: 1.0000 g | INTRAVENOUS | Status: DC

## 2023-04-11 MED ORDER — SODIUM CHLORIDE 0.9 % IV SOLN
2.0000 g | Freq: Three times a day (TID) | INTRAVENOUS | Status: DC
  Administered 2023-04-11 – 2023-04-19 (×24): 2 g via INTRAVENOUS
  Filled 2023-04-11 (×23): qty 12.5

## 2023-04-11 MED ORDER — GUAIFENESIN ER 600 MG PO TB12
600.0000 mg | ORAL_TABLET | Freq: Two times a day (BID) | ORAL | Status: DC
  Administered 2023-04-11 – 2023-04-17 (×13): 600 mg via ORAL
  Filled 2023-04-11 (×13): qty 1

## 2023-04-11 MED ORDER — ACETAMINOPHEN 650 MG RE SUPP: 650.0000 mg | Freq: Four times a day (QID) | RECTAL | Status: DC | PRN

## 2023-04-11 MED ORDER — INSULIN ASPART 100 UNIT/ML IJ SOLN
0.0000 [IU] | Freq: Three times a day (TID) | INTRAMUSCULAR | Status: DC
  Administered 2023-04-11 – 2023-04-12 (×4): 2 [IU] via SUBCUTANEOUS
  Administered 2023-04-12: 3 [IU] via SUBCUTANEOUS
  Administered 2023-04-13: 2 [IU] via SUBCUTANEOUS
  Administered 2023-04-13: 1 [IU] via SUBCUTANEOUS
  Administered 2023-04-13 – 2023-04-14 (×3): 2 [IU] via SUBCUTANEOUS
  Administered 2023-04-15: 1 [IU] via SUBCUTANEOUS
  Administered 2023-04-15: 2 [IU] via SUBCUTANEOUS
  Administered 2023-04-16: 3 [IU] via SUBCUTANEOUS
  Administered 2023-04-16: 2 [IU] via SUBCUTANEOUS
  Administered 2023-04-16: 3 [IU] via SUBCUTANEOUS
  Administered 2023-04-17: 1 [IU] via SUBCUTANEOUS
  Administered 2023-04-17: 2 [IU] via SUBCUTANEOUS
  Administered 2023-04-18 (×2): 1 [IU] via SUBCUTANEOUS

## 2023-04-11 MED ORDER — GLUCERNA SHAKE PO LIQD
237.0000 mL | Freq: Three times a day (TID) | ORAL | Status: DC
  Administered 2023-04-12 – 2023-04-22 (×27): 237 mL via ORAL

## 2023-04-11 MED ORDER — PANTOPRAZOLE SODIUM 40 MG PO TBEC
40.0000 mg | DELAYED_RELEASE_TABLET | Freq: Every day | ORAL | Status: DC
  Administered 2023-04-11 – 2023-04-22 (×12): 40 mg via ORAL
  Filled 2023-04-11 (×12): qty 1

## 2023-04-11 MED ORDER — SODIUM CHLORIDE 0.9 % IV SOLN
500.0000 mg | Freq: Once | INTRAVENOUS | Status: AC
  Administered 2023-04-11: 500 mg via INTRAVENOUS
  Filled 2023-04-11: qty 5

## 2023-04-11 MED ORDER — EMPAGLIFLOZIN 10 MG PO TABS
10.0000 mg | ORAL_TABLET | Freq: Every day | ORAL | Status: DC
  Administered 2023-04-11 – 2023-04-22 (×11): 10 mg via ORAL
  Filled 2023-04-11 (×12): qty 1

## 2023-04-11 MED ORDER — SODIUM CHLORIDE 0.9 % IV SOLN: INTRAVENOUS | Status: DC

## 2023-04-11 MED ORDER — SODIUM CHLORIDE 0.9 % IV SOLN: 2.0000 g | INTRAVENOUS | Status: DC

## 2023-04-11 MED ORDER — FLUDROCORTISONE ACETATE 0.1 MG PO TABS
0.2000 mg | ORAL_TABLET | Freq: Every day | ORAL | Status: DC
  Administered 2023-04-11 – 2023-04-22 (×12): 0.2 mg via ORAL
  Filled 2023-04-11 (×12): qty 2

## 2023-04-11 MED ORDER — ONDANSETRON HCL 4 MG/2ML IJ SOLN
4.0000 mg | Freq: Four times a day (QID) | INTRAMUSCULAR | Status: DC | PRN
  Administered 2023-04-11 – 2023-04-18 (×4): 4 mg via INTRAVENOUS
  Filled 2023-04-11 (×4): qty 2

## 2023-04-11 MED ORDER — ENOXAPARIN SODIUM 40 MG/0.4ML IJ SOSY
40.0000 mg | PREFILLED_SYRINGE | INTRAMUSCULAR | Status: DC
  Administered 2023-04-11 – 2023-04-22 (×12): 40 mg via SUBCUTANEOUS
  Filled 2023-04-11 (×12): qty 0.4

## 2023-04-11 MED ORDER — ACETAMINOPHEN 325 MG PO TABS
650.0000 mg | ORAL_TABLET | Freq: Four times a day (QID) | ORAL | Status: DC | PRN
  Administered 2023-04-11 – 2023-04-15 (×2): 650 mg via ORAL
  Filled 2023-04-11 (×2): qty 2

## 2023-04-11 MED ORDER — INSULIN ASPART 100 UNIT/ML IJ SOLN
5.0000 [IU] | Freq: Three times a day (TID) | INTRAMUSCULAR | Status: DC
  Administered 2023-04-12 – 2023-04-14 (×7): 5 [IU] via SUBCUTANEOUS

## 2023-04-11 MED ORDER — POTASSIUM CHLORIDE IN NACL 40-0.9 MEQ/L-% IV SOLN
INTRAVENOUS | Status: AC
  Filled 2023-04-11: qty 1000

## 2023-04-11 MED ORDER — MIDODRINE HCL 5 MG PO TABS: 7.5000 mg | ORAL_TABLET | Freq: Three times a day (TID) | ORAL | Status: DC

## 2023-04-11 MED ORDER — ALBUTEROL SULFATE (2.5 MG/3ML) 0.083% IN NEBU: 2.5000 mg | INHALATION_SOLUTION | Freq: Four times a day (QID) | RESPIRATORY_TRACT | Status: DC | PRN

## 2023-04-11 MED ORDER — MELATONIN 3 MG PO TABS
3.0000 mg | ORAL_TABLET | Freq: Every evening | ORAL | Status: DC | PRN
  Administered 2023-04-15 – 2023-04-18 (×2): 3 mg via ORAL
  Filled 2023-04-11 (×2): qty 1

## 2023-04-11 MED ORDER — SODIUM CHLORIDE 0.9 % IV SOLN
500.0000 mg | INTRAVENOUS | Status: DC
  Filled 2023-04-11: qty 5

## 2023-04-11 MED ORDER — ASPIRIN 81 MG PO TBEC
81.0000 mg | DELAYED_RELEASE_TABLET | Freq: Every day | ORAL | Status: DC
  Administered 2023-04-11 – 2023-04-22 (×12): 81 mg via ORAL
  Filled 2023-04-11 (×12): qty 1

## 2023-04-11 MED ORDER — VANCOMYCIN HCL 1500 MG/300ML IV SOLN
1500.0000 mg | Freq: Two times a day (BID) | INTRAVENOUS | Status: DC
  Administered 2023-04-12: 1500 mg via INTRAVENOUS
  Filled 2023-04-11: qty 300

## 2023-04-11 NOTE — H&P (Signed)
History and Physical    Patient: Micheal Moore. RUE:[id number] DOB: 1951/04/18 DOA: 04/10/2023 DOS: the patient was seen and examined on 04/11/2023 PCP: [name], MD  Patient coming from: home via EMS  Chief Complaint:  Chief Complaint  Patient presents with   Emesis      HPI: Micheal Moore. is a 72 y.o. male with medical history significant of CAD s/p PCI, syncope, orthostatic hypotension, diabetes mellitus type 2, DVT of right leg, peripheral artery disease, right elbow melanoma s/p excision who presents with nausea and vomiting.  History is obtained from patient and with the assistance of his wife over the phone.  Patient states that he does not ambulate and is bedbound.  However, over the phone wife makes note that no one ever signed off to say that he cannot walk.  She reports that she takes care of him and cleans him, but does expect him to do certain things for himself such as giving himself meds as he can see.  She reports that he lays in a hospital bed and eat while laying flat when he knows he should be sitting up.  Also makes note he possibly does not always take his medications as he should despite her laying them out for him.  Last night patient developed acute onset of nausea and vomiting, but had been in his normal state of health prior to that as he had been seen by his primary care provider just 2 days ago.  He had not had any cough.  In route with EMS patient was noted to be febrile up to 100.5 F.   In the emergency department patient was noted to have temperature up to 99.2 F with tachycardia, blood pressures 93/51 -150/62, and O2 saturation maintained on room air.  Repeat labs today noted WBC 8.9, hemoglobin 12.6, sodium 132, potassium 3.2, BUN 20, creatinine 0.78, total bilirubin 1.4, lactic acid 1.1 -> 1.6, procalcitonin 0.4, BNP 79.6, and high-sensitivity troponin 14->18.  Chest x-ray revealed central peribronchial wall thickening within some  interstitial and patchy opacities in both lower lungs concerning for atypical/viral infection.  Influenza, COVID-19, and RSV screening were negative.  Blood cultures have been obtained.  Patient had been given Zofran, Rocephin, and azithromycin. Review of Systems: As mentioned in the history of present illness. All other systems reviewed and are negative. Past Medical History:  Diagnosis Date   Cancer (HCC)    right elbow melanoma   Diabetes mellitus without complication (HCC)    Type II   Dizziness    DVT (deep venous thrombosis) (HCC)    right leg   Frequent falls    Near syncope    Syncope    Past Surgical History:  Procedure Laterality Date   AMPUTATION TOE Left 04/28/2020   Procedure: AMPUTATION LEFT GREAT TOE;  Surgeon: Nadara Mustard, MD;  Location: [facility] OR;  Service: Orthopedics;  Laterality: Left;   CORONARY/GRAFT ACUTE MI REVASCULARIZATION N/A 01/15/2023   Procedure: Coronary/Graft Acute MI Revascularization;  Surgeon: Orbie Pyo, MD;  Location: [facility] INVASIVE CV LAB;  Service: Cardiovascular;  Laterality: N/A;   INTRAMEDULLARY (IM) NAIL INTERTROCHANTERIC Right 02/23/2022   Procedure: INTRAMEDULLARY (IM) NAIL INTERTROCHANTRIC, RIGHT;  Surgeon: Samson Frederic, MD;  Location: [facility] ORS;  Service: Orthopedics;  Laterality: Right;   LEFT HEART CATH AND CORONARY ANGIOGRAPHY N/A 01/15/2023   Procedure: LEFT HEART CATH AND CORONARY ANGIOGRAPHY;  Surgeon: Orbie Pyo, MD;  Location: [facility] INVASIVE CV LAB;  Service: Cardiovascular;  Laterality: N/A;   LOWER EXTREMITY ANGIOGRAPHY Bilateral 01/18/2023   Procedure: Lower Extremity Angiography;  Surgeon: Nada Libman, MD;  Location: [facility] INVASIVE CV LAB;  Service: Cardiovascular;  Laterality: Bilateral;   MELANOMA EXCISION WITH SENTINEL LYMPH NODE BIOPSY Right 08/19/2020   Procedure: WIDE LOCAL EXCISION RIGHT ELBOW MELANOMA WITH RIGHT AXILLARY SENTINEL LYMPH NODE BIOPSY;  Surgeon: Fritzi Mandes, MD;  Location: [facility] OR;  Service: General;   Laterality: Right;  2nd incision in axilla   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   Procedure: TOTAL HIP ARTHROPLASTY ANTERIOR APPROACH;  Surgeon: Samson Frederic, MD;  Location: [facility] ORS;  Service: Orthopedics;  Laterality: Left;   TOTAL HIP ARTHROPLASTY Left 08/24/2022   Procedure: OPEN REDUCTION, HEAD AND LINER EXCHANGE;  Surgeon: Samson Frederic, MD;  Location: [facility] ORS;  Service: Orthopedics;  Laterality: Left;   Social History:  reports that he has been smoking cigarettes. He has a 3.8 pack-year smoking history. He has never used smokeless tobacco. He reports that he does not currently use drugs. He reports that he does not drink alcohol.  No Known Allergies  Family History  Problem Relation Age of Onset   Stroke Mother    Diabetes Father    Stroke Sister    Diabetes Sister     Prior to Admission medications   Medication Sig Start Date End Date Taking? Authorizing Provider  aspirin EC 81 MG tablet Take 1 tablet (81 mg total) by mouth daily. Swallow whole. 01/20/23   Lanae Boast, MD  atorvastatin (LIPITOR) 80 MG tablet Take 1 tablet (80 mg total) by mouth daily. 01/21/23 02/20/23  Lanae Boast, MD  Blood Glucose Monitoring Suppl DEVI 1 each by Does not apply route 3 (three) times daily. May dispense any manufacturer covered by patient's insurance. 01/20/23   Lanae Boast, MD  Ensure Max Protein (ENSURE MAX PROTEIN) LIQD Take 330 mLs (11 oz total) by mouth 2 (two) times daily. Patient taking differently: Take 11 oz by mouth daily. 08/28/22   Lorin Glass, MD  fludrocortisone (FLORINEF) 0.1 MG tablet Take 2 tablets (0.2 mg total) by mouth daily. 03/05/22   Rolly Salter, MD  Glucagon, rDNA, (GLUCAGON EMERGENCY) 1 MG KIT Inject 1 mg into the skin as needed for up to 2 doses (Severe low blood sugar). 01/20/23   Lanae Boast, MD  Glucose Blood (BLOOD GLUCOSE TEST STRIPS) STRP 1 each by Does not apply route 3 (three) times daily. Use as directed to check blood sugar. May dispense any manufacturer covered by  patient's insurance and fits patient's device. 01/20/23   Lanae Boast, MD  insulin aspart (NOVOLOG) 100 UNIT/ML FlexPen Inject 8 Units into the skin 3 (three) times daily with meals. Only take if eating a meal AND Blood Glucose (BG) is 80 or higher. 01/20/23 02/19/23  Lanae Boast, MD  insulin glargine (LANTUS) 100 UNIT/ML Solostar Pen Inject 35 Units into the skin at bedtime. May substitute as needed per insurance. 01/20/23 02/19/23  Lanae Boast, MD  Insulin Pen Needle (PEN NEEDLES) 31G X 5 MM MISC 1 each by Does not apply route 3 (three) times daily. May dispense any manufacturer covered by patient's insurance. 01/20/23   Lanae Boast, MD  Lancet Device MISC 1 each by Does not apply route 3 (three) times daily. May dispense any manufacturer covered by patient's insurance. 01/20/23   Lanae Boast, MD  Lancets MISC 1 each by Does not apply route 3 (three) times daily. Use as directed to check blood sugar. May dispense any  manufacturer covered by [employer] and fits patient's device. 01/20/23   Lanae Boast, MD  midodrine (PROAMATINE) 5 MG tablet Take 0.5 tablets (2.5 mg total) by mouth 3 (three) times daily with meals. Patient taking differently: Take 7.5 mg by mouth 3 (three) times daily with meals. 07/25/22   Rodolph Bong, MD  polyethylene glycol (MIRALAX / GLYCOLAX) 17 g packet Take 17 g by mouth daily. Patient taking differently: Take 17 g by mouth daily as needed for mild constipation. 03/05/22   Rolly Salter, MD    Physical Exam: Vitals:   04/11/23 0120 04/11/23 0203 04/11/23 0247 04/11/23 0534  BP: (!) 117/58  139/80 (!) 93/51  Pulse: (!) 109  (!) 113 (!) 104  Resp: 16  18 18   Temp:  98.1 F (36.7 C) 99.2 F (37.3 C) 97.7 F (36.5 C)  TempSrc:  Oral    SpO2: 99%  98% 96%  Weight:        Constitutional: NAD, calm, comfortable Eyes: PERRL, lids and conjunctivae normal ENMT: Mucous membranes are moist. Posterior pharynx clear of any exudate or lesions.Normal dentition.  Neck: normal, supple,  no masses, no thyromegaly Respiratory: clear to auscultation bilaterally, no wheezing, no crackles. Normal respiratory effort. No accessory muscle use.  Cardiovascular: Regular rate and rhythm, no murmurs / rubs / gallops. No extremity edema. 2+ pedal pulses. No carotid bruits.  Abdomen: no tenderness, no masses palpated. No hepatosplenomegaly. Bowel sounds positive.  Musculoskeletal: no clubbing / cyanosis.  Amputation of the great toe on left foot Skin: no rashes, lesions, ulcers. No induration Neurologic: CN 2-12 grossly intact. Sensation intact, DTR normal.  Strength 4/5 in the right lower extremity.  Strength 2-3 in the left lower extremity. Psychiatric: Normal judgment and insight. Alert and oriented x 3. Normal mood.   Data Reviewed:  EKG revealed sinus tachycardia 115 bpm.  Reviewed labs, imaging, and pertinent records as documented in this note.  Assessment and Plan: Suspected aspiration pneumonia Acute.  Patient presents with complaints of nausea and vomiting.  Chest x-ray noted central peribronchial wall thickening within some interstitial and patchy opacities in both lower lungs concerning for atypical/viral infection.  Influenza, COVID-19, and RSV screening were negative.  Procalcitonin was elevated at 0.4.  Based off history suspect possible aspiration pneumonia/bacterial pneumonia. -Admit to a telemetry bed -Follow-up blood and sputum cultures -Follow-up urine strep and urine Legionella -Incentive spirometry and flutter valve -Continue Rocephin and azithromycin -Mucinex  Nausea and vomiting Acute.  Patient was reported to have acute onset of nausea and vomiting yesterday evening. -Aspiration precautions with orders to elevate head of bed greater than 30 degrees -Antiemetic as needed -Normal saline IV fluids at 100 mL/h x 1 L -Formal speech therapy consult  Orthostatic hypotension Chronic.  Blood pressures initially noted to be as low as 93/51.  Medication regimen  includes midodrine 7.5 mg 3 times daily -Continue midodrine and fludrocortisone  Elevated troponin CAD Acute.  High-sensitivity troponin 14->18.  Patient with recent inferior STEMI on 4/30 with stent placement..  Not on beta-blocker due to hypotension. -Recheck additional troponin. -Continue Brilinta and aspirin  Hypokalemia Acute.  Repeat potassium 3.2 this morning. -40 mEq of potassium chloride added to IV fluids -Continue to monitor and replace as needed  Uncontrolled diabetes mellitus type 2, with long-term use of insulin Last available hemoglobin A1c was 13.3 on  01/15/2023.  Patient reports taking Lantus 30-35 units nightly, NovoLog 8 to 10 units of 3 times daily with meals. -Hypoglycemic protocols -Carb modified  diet -Pharmacy substitution of Semglee reduced to 20 units nightly -NovoLog 5 units with meals -CBGs before every meal with sensitive SSI -Adjust insulin regimen as needed  Normocytic anemia Hemoglobin stable at 12.6. -Continue to monitor  Peripheral vascular disease Patient with prior left abnormal ABI toe brachial index where he had aortogram with bilateral runoff with no intervention needed.  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 -Continue Brilinta and statin  Hyperlipidemia -Continue Crestor  History of DVT Patient previously had been on Eliquis, but was not currently taking and just on aspirin.  Pressure ulcer of the left heel -Wound care consult  Debility -PT/OT to eval and treat -Transitions of care for need of placement   GI prophylaxis: Protonix DVT prophylaxis: Lovenox Advance Care Planning:   Code Status: Full Code   Consults: None  Family Communication: Wife updated over the phone  Severity of Illness: The appropriate patient status for this patient is INPATIENT. Inpatient status is judged to be reasonable and necessary in order to provide the required intensity of service to ensure the patient's safety.  The patient's presenting symptoms, physical exam findings, and initial radiographic and laboratory data in the context of their chronic comorbidities is felt to place them at high risk for further clinical deterioration. Furthermore, it is not anticipated that the patient will be medically stable for discharge from the hospital within 2 midnights of admission.   * I certify that at the point of admission it is my clinical judgment that the patient will require inpatient hospital care spanning beyond 2 midnights from the point of admission due to high intensity of service, high risk for further deterioration and high frequency of surveillance required.*  Author: Clydie Braun, MD 04/11/2023 8:05 AM  For on call review [email or web address].

## 2023-04-11 NOTE — ED Notes (Signed)
ED TO INPATIENT HANDOFF REPORT  ED Nurse Name and Phone #: Selena Lesser [name]/ [phone]  S Name/Age/Gender Micheal Moore. 72 y.o. male Room/Bed: 020C/020C  Code Status   Code Status: Full Code  Home/SNF/Other Home Patient oriented to: self, place, time, and situation Is this baseline? Yes   Triage Complete: Triage complete  Chief Complaint CAP (community acquired pneumonia) [J18.9]  Triage Note Pt BIB EMS for couple episodes of vomiting onset tonight. Pt denies pain. Has also had new cough for the last 2 days. EMS report temp of 100.5 en route, given 650 mg Tylenol. Initial sats 89-90% with EMS.    Allergies No Known Allergies  Level of Care/Admitting Diagnosis ED Disposition     ED Disposition  Admit   Condition  --   Comment  Hospital Area: [facility] [[id number]]  Level of Care: Telemetry Medical [[id number]]  May admit patient to [facility] or [facility] if equivalent level of care is available:: No  Covid Evaluation: Asymptomatic - no recent exposure (last 10 days) testing not required  Diagnosis: CAP (community acquired pneumonia) [784696]  Admitting Physician: Angie Fava [[id number]]  Attending Physician: Angie Fava [[id number]]  Certification:: I certify this patient will need inpatient services for at least 2 midnights  Estimated Length of Stay: 2          B Medical/Surgery History Past Medical History:  Diagnosis Date   Cancer (HCC)    right elbow melanoma   Diabetes mellitus without complication (HCC)    Type II   Dizziness    DVT (deep venous thrombosis) (HCC)    right leg   Frequent falls    Near syncope    Syncope    Past Surgical History:  Procedure Laterality Date   AMPUTATION TOE Left 04/28/2020   Procedure: AMPUTATION LEFT GREAT TOE;  Surgeon: Nadara Mustard, MD;  Location: [facility] OR;  Service: Orthopedics;  Laterality: Left;   CORONARY/GRAFT ACUTE MI REVASCULARIZATION N/A 01/15/2023   Procedure:  Coronary/Graft Acute MI Revascularization;  Surgeon: Orbie Pyo, MD;  Location: [facility] IN[facility]ASIVE [facility];  Service: Cardiovascular;  Laterality: N/A;   INTRAMEDULLARY (IM) NAIL INTERTROCHANTERIC Right 02/23/2022   Procedure: INTRAMEDULLARY (IM) NAIL INTERTROCHANTRIC, RIGHT;  Surgeon: Samson Frederic, MD;  Location: [facility] ORS;  Service: Orthopedics;  Laterality: Right;   LEFT HEART CATH AND CORONARY ANGIOGRAPHY N/A 01/15/2023   Procedure: LEFT HEART CATH AND CORONARY ANGIOGRAPHY;  Surgeon: Orbie Pyo, MD;  Location: [facility];  Service: Cardiovascular;  Laterality: N/A;   LOWER EXTREMITY ANGIOGRAPHY Bilateral 01/18/2023   Procedure: Lower Extremity Angiography;  Surgeon: Nada Libman, MD;  Location: [facility];  Service: Cardiovascular;  Laterality: Bilateral;   MELANOMA EXCISION WITH SENTINEL LYMPH NODE BIOPSY Right 08/19/2020   Procedure: WIDE LOCAL EXCISION RIGHT ELBOW MELANOMA WITH RIGHT AXILLARY SENTINEL LYMPH NODE BIOPSY;  Surgeon: Fritzi Mandes, MD;  Location: [facility] OR;  Service: General;  Laterality: Right;  2nd incision in axilla   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   Procedure: TOTAL HIP ARTHROPLASTY ANTERIOR APPROACH;  Surgeon: Samson Frederic, MD;  Location: [facility];  Service: Orthopedics;  Laterality: Left;   TOTAL HIP ARTHROPLASTY Left 08/24/2022   Procedure: OPEN REDUCTION, HEAD AND LINER EXCHANGE;  Surgeon: Samson Frederic, MD;  Location: [facility];  Service: Orthopedics;  Laterality: Left;     A IV Location/Drains/Wounds Patient Lines/Drains/Airways Status     Active Line/Drains/Airways     Name Placement date Placement  time Site Days   Peripheral IV 04/10/23 20 G Proximal;Right;Posterior Forearm 04/10/23  2302  Forearm  1   Peripheral IV 04/10/23 20 G Left;Anterior;Proximal Forearm 04/10/23  2313  Forearm  1   Negative Pressure Wound Therapy Hip Left 08/24/22  1316  --  230   Pressure Injury 01/15/23 Buttocks Right Stage 2 -  Partial thickness loss of dermis  presenting as a shallow open injury with a red, pink wound bed without slough. 01/15/23  --  -- 86   Pressure Injury 01/15/23 Heel Right;Left Stage 2 -  Partial thickness loss of dermis presenting as a shallow open injury with a red, pink wound bed without slough. dry scabbed healing pressure injuries to bilat heels 01/15/23  --  -- 86   Pressure Injury 01/15/23 Foot Left;Anterior Deep Tissue Pressure Injury - Purple or maroon localized area of discolored intact skin or blood-filled blister due to damage of underlying soft tissue from pressure and/or shear. 01/15/23  --  -- 86   Pressure Injury 01/15/23 Foot Left;Anterior Deep Tissue Pressure Injury - Purple or maroon localized area of discolored intact skin or blood-filled blister due to damage of underlying soft tissue from pressure and/or shear. inner foot 01/15/23  --  -- 86   Wound / Incision (Open or Dehisced) 08/28/22 Irritant Dermatitis (Moisture Associated Skin Damage) Buttocks Right;Left 08/28/22  --  Buttocks  226   Wound / Incision (Open or Dehisced) 01/15/23 Other (Comment) Leg Left;Posterior 01/15/23  --  Leg  86   Wound / Incision (Open or Dehisced) 01/15/23 Other (Comment) Leg Right;Posterior 01/15/23  --  Leg  86   Wound / Incision (Open or Dehisced) 01/15/23 Other (Comment) Toe (Comment  which one) Right right great toe 01/15/23  --  Toe (Comment  which one)  86   Wound / Incision (Open or Dehisced) 01/15/23 Other (Comment) Toe (Comment  which one) Left;Right dry scab to left and right second toes 01/15/23  --  Toe (Comment  which one)  86            Intake/Output Last 24 hours No intake or output data in the 24 hours ending 04/11/23 0139  Labs/Imaging Results for orders placed or performed during the hospital encounter of 04/10/23 (from the past 48 hour(s))  CBC with Differential     Status: Abnormal   Collection Time: 04/10/23 10:55 PM  Result Value Ref Range   WBC 9.0 4.0 - 10.5 K/uL   RBC 4.12 (L) 4.22 - 5.81 MIL/uL    Hemoglobin 12.7 (L) 13.0 - 17.0 g/dL   HCT 19.1 47.8 - 29.5 %   MCV 94.7 80.0 - 100.0 fL   MCH 30.8 26.0 - 34.0 pg   MCHC 32.6 30.0 - 36.0 g/dL   RDW 62.1 30.8 - 65.7 %   Platelets 272 150 - 400 K/uL   nRBC 0.0 0.0 - 0.2 %   Neutrophils Relative % 85 %   Neutro Abs 7.6 1.7 - 7.7 K/uL   Lymphocytes Relative 11 %   Lymphs Abs 1.0 0.7 - 4.0 K/uL   Monocytes Relative 4 %   Monocytes Absolute 0.3 0.1 - 1.0 K/uL   Eosinophils Relative 0 %   Eosinophils Absolute 0.0 0.0 - 0.5 K/uL   Basophils Relative 0 %   Basophils Absolute 0.0 0.0 - 0.1 K/uL   Immature Granulocytes 0 %   Abs Immature Granulocytes 0.04 0.00 - 0.07 K/uL    Comment: Performed at [facility], [address] [place], [place] [address]  Comprehensive metabolic panel     Status: Abnormal   Collection Time: 04/10/23 10:55 PM  Result Value Ref Range   Sodium 133 (L) 135 - 145 mmol/L   Potassium 3.1 (L) 3.5 - 5.1 mmol/L   Chloride 99 98 - 111 mmol/L   CO2 21 (L) 22 - 32 mmol/L   Glucose, Bld 119 (H) 70 - 99 mg/dL    Comment: Glucose reference range applies only to samples taken after fasting for at least 8 hours.   BUN 18 8 - 23 mg/dL   Creatinine, Ser 7.56 0.61 - 1.24 mg/dL   Calcium 8.7 (L) 8.9 - 10.3 mg/dL   Total Protein 7.0 6.5 - 8.1 g/dL   Albumin 3.3 (L) 3.5 - 5.0 g/dL   AST 14 (L) 15 - 41 U/L   ALT 12 0 - 44 U/L   Alkaline Phosphatase 76 38 - 126 U/L   Total Bilirubin 1.4 (H) 0.3 - 1.2 mg/dL   GFR, Estimated >43 >32 mL/min    Comment: (NOTE) Calculated using the CKD-EPI Creatinine Equation (2021)    Anion gap 13 5 - 15    Comment: Performed at [facility], [address], [place], [place] [address]  Lipase, blood     Status: None   Collection Time: 04/10/23 10:55 PM  Result Value Ref Range   Lipase 23 11 - 51 U/L    Comment: Performed at [facility], [address], [place], [place] [address]  Troponin I (High Sensitivity)     Status: None   Collection Time: 04/10/23 10:55 PM   Result Value Ref Range   Troponin I (High Sensitivity) 14 <18 ng/L    Comment: (NOTE) Elevated high sensitivity troponin I (hsTnI) values and significant  changes across serial measurements may suggest ACS but many other  chronic and acute conditions are known to elevate hsTnI results.  Refer to the "Links" section for chest pain algorithms and additional  guidance. Performed at [facility], [address], [place], [place] [address]   Brain natriuretic peptide     Status: None   Collection Time: 04/10/23 10:55 PM  Result Value Ref Range   B Natriuretic Peptide 79.6 0.0 - 100.0 pg/mL    Comment: Performed at [facility], [address], [place], [place] [address]  I-Stat CG4 Lactic Acid     Status: None   Collection Time: 04/10/23 11:02 PM  Result Value Ref Range   Lactic Acid, Venous 1.1 0.5 - 1.9 mmol/L  Resp panel by RT-PCR (RSV, Flu A&B, Covid) Anterior Nasal Swab     Status: None   Collection Time: 04/10/23 11:47 PM   Specimen: Anterior Nasal Swab  Result Value Ref Range   SARS Coronavirus 2 by RT PCR NEGATIVE NEGATIVE   Influenza A by PCR NEGATIVE NEGATIVE   Influenza B by PCR NEGATIVE NEGATIVE    Comment: (NOTE) The Xpert Xpress SARS-CoV-2/FLU/RSV plus assay is intended as an aid in the diagnosis of influenza from Nasopharyngeal swab specimens and should not be used as a sole basis for treatment. Nasal washings and aspirates are unacceptable for Xpert Xpress SARS-CoV-2/FLU/RSV testing.  Fact Sheet for Patients: BloggerCourse.com  Fact Sheet for Healthcare Providers: SeriousBroker.it  This test is not yet approved or cleared by the [place] FDA and has been authorized for detection and/or diagnosis of SARS-CoV-2 by FDA under an Emergency Use Authorization (EUA). This EUA will remain  in effect (meaning this test can be used) for the duration of the COVID-19 declaration under Section 564(b)(1) of the  Act, 21 U.S.C. section 360bbb-3(b)(1), unless the authorization is terminated or revoked.     Resp Syncytial Virus by PCR NEGATIVE NEGATIVE    Comment: (NOTE) Fact Sheet for Patients: BloggerCourse.com  Fact Sheet for Healthcare Providers: SeriousBroker.it  This test is not yet approved or cleared by the [place] FDA and has been authorized for detection and/or diagnosis of SARS-CoV-2 by FDA under an Emergency Use Authorization (EUA). This EUA will remain in effect (meaning this test can be used) for the duration of the COVID-19 declaration under Section 564(b)(1) of the Act, 21 U.S.C. section 360bbb-3(b)(1), unless the authorization is terminated or revoked.  Performed at [facility], [address], [place], [place] [address]   Troponin I (High Sensitivity)     Status: Abnormal   Collection Time: 04/11/23 12:41 AM  Result Value Ref Range   Troponin I (High Sensitivity) 18 (H) <18 ng/L    Comment: (NOTE) Elevated high sensitivity troponin I (hsTnI) values and significant  changes across serial measurements may suggest ACS but many other  chronic and acute conditions are known to elevate hsTnI results.  Refer to the "Links" section for chest pain algorithms and additional  guidance. Performed at [facility], [address] [place], [place] [address]    DG Chest Port 1 View  Result Date: 04/10/2023 CLINICAL DATA:  Cough and fever EXAM: PORTABLE CHEST 1 VIEW COMPARISON:  Chest x-ray 10/01/2022 FINDINGS: There is central peribronchial wall thickening with some interstitial and patchy opacities in both lower lungs. There is no focal lung consolidation, pleural effusion or pneumothorax. Cardiomediastinal silhouette is within normal limits. IMPRESSION: Central peribronchial wall thickening with some interstitial and patchy opacities in both lower lungs. Findings are nonspecific and could be seen in the setting of  atypical/viral infection. Electronically Signed   By: Darliss Cheney M.D.   On: 04/10/2023 23:23    Pending Labs Unresulted Labs (From admission, onward)     Start     Ordered   04/11/23 0500  CBC with Differential/Platelet  Tomorrow morning,   R        04/11/23 0133   04/11/23 0500  Comprehensive metabolic panel  Tomorrow morning,   R        04/11/23 0133   04/11/23 0500  Magnesium  Tomorrow morning,   R        04/11/23 0133   04/11/23 0500  Phosphorus  Tomorrow morning,   R        04/11/23 0133   04/10/23 2232  Blood culture (routine x 2)  BLOOD CULTURE X 2,   R      04/10/23 2233            Vitals/Pain Today's Vitals   04/10/23 2245 04/10/23 2300 04/11/23 0000 04/11/23 0120  BP: (!) 150/62   (!) 117/58  Pulse: (!) 108 (!) 104 (!) 108 (!) 109  Resp:    16  Temp:      TempSrc:      SpO2: 95% 94% 97% 99%  Weight:      PainSc:        Isolation Precautions No active isolations  Medications Medications  cefTRIAXone (ROCEPHIN) 1 g in sodium chloride 0.9 % 100 mL IVPB (1 g Intravenous New Bag/Given 04/11/23 0115)  azithromycin (ZITHROMAX) 500 mg in sodium chloride 0.9 % 250 mL IVPB (has  no administration in time range)  acetaminophen (TYLENOL) tablet 650 mg (has no administration in time range)    Or  acetaminophen (TYLENOL) suppository 650 mg (has no administration in time range)  melatonin tablet 3 mg (has no administration in time range)  ondansetron (ZOFRAN) injection 4 mg (has no administration in time range)  cefTRIAXone (ROCEPHIN) 1 g in sodium chloride 0.9 % 100 mL IVPB (has no administration in time range)  azithromycin (ZITHROMAX) 500 mg in sodium chloride 0.9 % 250 mL IVPB (has no administration in time range)  ondansetron (ZOFRAN) injection 4 mg (4 mg Intravenous Given 04/10/23 2302)    Mobility non-ambulatory     Focused Assessments Pulmonary Assessment Handoff:  Lung sounds: Bilateral Breath Sounds: Clear O2 Device: Room Air       R Recommendations: See Admitting Provider Note  Report given to:   Additional Notes:

## 2023-04-11 NOTE — Plan of Care (Signed)
  Problem: Education: Goal: Knowledge of General Education information will improve Description Including pain rating scale, medication(s)/side effects and non-pharmacologic comfort measures Outcome: Progressing   

## 2023-04-11 NOTE — ED Notes (Signed)
PT reports improvement in nausea. No episodes of vomiting noted since arrival to ED.

## 2023-04-11 NOTE — Progress Notes (Signed)
PHARMACY - PHYSICIAN COMMUNICATION CRITICAL VALUE ALERT - BLOOD CULTURE IDENTIFICATION (BCID)  Lorraine Terriquez. is an 72 y.o. male who presented to [facility] on 04/10/2023 with a chief complaint of vomiting/cough/fever.   Assessment:  72 year old male admitted with suspected CAP. Now blood cultures positive for MRSA and an unspecified Enterobacterales species in 1/4 blood cultures. Source not totally clear- noted that patient has a history of  foot infections.   Name of physician (or Provider) Contacted: Madelyn Flavors, MD ID  team- Dr. Luciana Axe and Marcos Eke, NP   Current antibiotics: ceftriaxone/azith   Changes to prescribed antibiotics recommended:  Start Vancomycin  Change ceftriaxone to cefepime Stop azithromycin   Results for orders placed or performed during the hospital encounter of 04/10/23  Blood Culture ID Panel (Reflexed) (Collected: 04/10/2023 10:55 PM)  Result Value Ref Range   Enterococcus faecalis NOT DETECTED NOT DETECTED   Enterococcus Faecium NOT DETECTED NOT DETECTED   Listeria monocytogenes NOT DETECTED NOT DETECTED   Staphylococcus species DETECTED (A) NOT DETECTED   Staphylococcus aureus (BCID) DETECTED (A) NOT DETECTED   Staphylococcus epidermidis NOT DETECTED NOT DETECTED   Staphylococcus lugdunensis NOT DETECTED NOT DETECTED   Streptococcus species NOT DETECTED NOT DETECTED   Streptococcus agalactiae NOT DETECTED NOT DETECTED   Streptococcus pneumoniae NOT DETECTED NOT DETECTED   Streptococcus pyogenes NOT DETECTED NOT DETECTED   A.calcoaceticus-baumannii NOT DETECTED NOT DETECTED   Bacteroides fragilis NOT DETECTED NOT DETECTED   Enterobacterales DETECTED (A) NOT DETECTED   Enterobacter cloacae complex NOT DETECTED NOT DETECTED   Escherichia coli NOT DETECTED NOT DETECTED   Klebsiella aerogenes NOT DETECTED NOT DETECTED   Klebsiella oxytoca NOT DETECTED NOT DETECTED   Klebsiella pneumoniae NOT DETECTED NOT DETECTED   Proteus species NOT  DETECTED NOT DETECTED   Salmonella species NOT DETECTED NOT DETECTED   Serratia marcescens NOT DETECTED NOT DETECTED   Haemophilus influenzae NOT DETECTED NOT DETECTED   Neisseria meningitidis NOT DETECTED NOT DETECTED   Pseudomonas aeruginosa NOT DETECTED NOT DETECTED   Stenotrophomonas maltophilia NOT DETECTED NOT DETECTED   Candida albicans NOT DETECTED NOT DETECTED   Candida auris NOT DETECTED NOT DETECTED   Candida glabrata NOT DETECTED NOT DETECTED   Candida krusei NOT DETECTED NOT DETECTED   Candida parapsilosis NOT DETECTED NOT DETECTED   Candida tropicalis NOT DETECTED NOT DETECTED   Cryptococcus neoformans/gattii NOT DETECTED NOT DETECTED   CTX-M ESBL NOT DETECTED NOT DETECTED   Carbapenem resistance IMP NOT DETECTED NOT DETECTED   Carbapenem resistance KPC NOT DETECTED NOT DETECTED   Meth resistant mecA/C and MREJ DETECTED (A) NOT DETECTED   Carbapenem resistance NDM NOT DETECTED NOT DETECTED   Carbapenem resist OXA 48 LIKE NOT DETECTED NOT DETECTED   Carbapenem resistance VIM NOT DETECTED NOT DETECTED    Sharin Mons, PharmD, BCPS, BCIDP Infectious Diseases Clinical Pharmacist Phone: [phone] 04/11/2023  3:06 PM

## 2023-04-11 NOTE — Consult Note (Signed)
[facility]       Reason for Consult:  bacteremia   Referring Physician: CHAMP autoconsult  Principal Problem:   CAP (community acquired pneumonia)    enoxaparin (LOVENOX) injection  40 mg Subcutaneous Q24H   guaiFENesin  600 mg Oral BID   insulin aspart  0-9 Units Subcutaneous TID WC   midodrine  7.5 mg Oral TID WC    Recommendations: Vancomycin + cefepime Repeat blood cultures TTE  Assessment: He has fever by report and now blood cultures positive with two organisms.  Has an open wound on his leg as a possible source.  Starting antibiotics and work up.  Concern that the multifocal opacities could be embolic.  Not c/w bacterial pneumonia.  Will check tTE, consider TEE, if indicated.    HPI: Micheal Moore. is a 72 y.o. male NH resident with a history of cardiac issues, diabetes and was sent in to the ED for evaluation for fever at the facility.  He reported cough for about 2 days, though endorses a chronic cough.  Some hypoxia during transport but has remained on room air here.  No shortness of breath.  No productive cough.  Blood cultures now positive for MRSA and Enterobacteriales.  Procalcitonin negative/normal.  He has had some nausea and vomiting.  CXR with some bilateral patchy opacities.  Respiratory viral panel negative.  Has a chronic wound on his left leg he reports is from the bed being too short for him.  History of toe amputations.    Review of Systems:  Constitutional: negative for fevers and chills All other systems reviewed and are negative    Past Medical History:  Diagnosis Date   Cancer (HCC)    right elbow melanoma   Diabetes mellitus without complication (HCC)    Type II   Dizziness    DVT (deep venous thrombosis) (HCC)    right leg   Frequent falls    Near syncope    Syncope     Social History   Tobacco Use   Smoking status: Every Day    Current packs/day: 0.20    Average packs/day: 0.2 packs/day for  19.0 years (3.8 ttl pk-yrs)    Types: Cigarettes   Smokeless tobacco: Never  Vaping Use   Vaping status: Never Used  Substance Use Topics   Alcohol use: No   Drug use: Not Currently    Family History  Problem Relation Age of Onset   Stroke Mother    Diabetes Father    Stroke Sister    Diabetes Sister     No Known Allergies  Physical Exam: Constitutional: in no apparent distress  Vitals:   04/11/23 0534 04/11/23 0818  BP: (!) 93/51 134/64  Pulse: (!) 104 (!) 108  Resp: 18 16  Temp: 97.7 F (36.5 C) 99.9 F (37.7 C)  SpO2: 96% 97%   EYES: anicteric ENMT: no thrush Respiratory: normal respiratory effort GI: soft Musculoskeletal: s/p amputation of toes, left leg wound wrapped  Lab Results  Component Value Date   WBC 8.9 04/11/2023   HGB 12.6 (L) 04/11/2023   HCT 38.3 (L) 04/11/2023   MCV 92.1 04/11/2023   PLT 212 04/11/2023    Lab Results  Component Value Date   CREATININE 0.78 04/11/2023   BUN 20 04/11/2023   NA 132 (L) 04/11/2023   K 3.2 (L) 04/11/2023   CL 97 (L) 04/11/2023   CO2 21 (L) 04/11/2023    Lab Results  Component Value Date   ALT 12 04/11/2023   AST 14 (L) 04/11/2023   ALKPHOS 74 04/11/2023     Microbiology: Recent Results (from the past 240 hour(s))  Blood culture (routine x 2)     Status: None (Preliminary result)   Collection Time: 04/10/23 10:55 PM   Specimen: BLOOD  Result Value Ref Range Status   Specimen Description BLOOD SITE NOT SPECIFIED  Final   Special Requests   Final    BOTTLES DRAWN AEROBIC AND ANAEROBIC Blood Culture results may not be optimal due to an excessive volume of blood received in culture bottles   Culture  Setup Time   Final    GRAM NEGATIVE RODS GRAM POSITIVE COCCI IN CLUSTERS IN BOTH AEROBIC AND ANAEROBIC BOTTLES Organism ID to follow CRITICAL RESULT CALLED TO, READ BACK BY AND VERIFIED WITH: E. SINCLAIR PHARMD, AT 1458 04/11/23 D. VANHOOK    Culture   Final    CULTURE REINCUBATED FOR BETTER  GROWTH Performed at [facility], [address], [place], [place] [address]    Report Status PENDING  Incomplete  Blood Culture ID Panel (Reflexed)     Status: Abnormal   Collection Time: 04/10/23 10:55 PM  Result Value Ref Range Status   Enterococcus faecalis NOT DETECTED NOT DETECTED Final   Enterococcus Faecium NOT DETECTED NOT DETECTED Final   Listeria monocytogenes NOT DETECTED NOT DETECTED Final   Staphylococcus species DETECTED (A) NOT DETECTED Final    Comment: CRITICAL RESULT CALLED TO, READ BACK BY AND VERIFIED WITH: E. SINCLAIR PHARMD, AT 1458 04/11/23 D. VANHOOK    Staphylococcus aureus (BCID) DETECTED (A) NOT DETECTED Final    Comment: Methicillin (oxacillin)-resistant Staphylococcus aureus (MRSA). MRSA is predictably resistant to beta-lactam antibiotics (except ceftaroline). Preferred therapy is vancomycin unless clinically contraindicated. Patient requires contact precautions if  hospitalized. CRITICAL RESULT CALLED TO, READ BACK BY AND VERIFIED WITH: E. SINCLAIR PHARMD, AT 1458 04/11/23 D. VANHOOK    Staphylococcus epidermidis NOT DETECTED NOT DETECTED Final   Staphylococcus lugdunensis NOT DETECTED NOT DETECTED Final   Streptococcus species NOT DETECTED NOT DETECTED Final   Streptococcus agalactiae NOT DETECTED NOT DETECTED Final   Streptococcus pneumoniae NOT DETECTED NOT DETECTED Final   Streptococcus pyogenes NOT DETECTED NOT DETECTED Final   A.calcoaceticus-baumannii NOT DETECTED NOT DETECTED Final   Bacteroides fragilis NOT DETECTED NOT DETECTED Final   Enterobacterales DETECTED (A) NOT DETECTED Final    Comment: Enterobacterales represent a large order of gram negative bacteria, not a single organism. Refer to culture for further identification. CRITICAL RESULT CALLED TO, READ BACK BY AND VERIFIED WITH: E. SINCLAIR PHARMD, AT 1458 04/11/23 D. VANHOOK    Enterobacter cloacae complex NOT DETECTED NOT DETECTED Final   Escherichia coli NOT DETECTED NOT  DETECTED Final   Klebsiella aerogenes NOT DETECTED NOT DETECTED Final   Klebsiella oxytoca NOT DETECTED NOT DETECTED Final   Klebsiella pneumoniae NOT DETECTED NOT DETECTED Final   Proteus species NOT DETECTED NOT DETECTED Final   Salmonella species NOT DETECTED NOT DETECTED Final   Serratia marcescens NOT DETECTED NOT DETECTED Final   Haemophilus influenzae NOT DETECTED NOT DETECTED Final   Neisseria meningitidis NOT DETECTED NOT DETECTED Final   Pseudomonas aeruginosa NOT DETECTED NOT DETECTED Final   Stenotrophomonas maltophilia NOT DETECTED NOT DETECTED Final   Candida albicans NOT DETECTED NOT DETECTED Final   Candida auris NOT DETECTED NOT DETECTED Final   Candida glabrata NOT DETECTED NOT DETECTED Final   Candida krusei NOT DETECTED NOT  DETECTED Final   Candida parapsilosis NOT DETECTED NOT DETECTED Final   Candida tropicalis NOT DETECTED NOT DETECTED Final   Cryptococcus neoformans/gattii NOT DETECTED NOT DETECTED Final   CTX-M ESBL NOT DETECTED NOT DETECTED Final   Carbapenem resistance IMP NOT DETECTED NOT DETECTED Final   Carbapenem resistance KPC NOT DETECTED NOT DETECTED Final   Meth resistant mecA/C and MREJ DETECTED (A) NOT DETECTED Final    Comment: CRITICAL RESULT CALLED TO, READ BACK BY AND VERIFIED WITH: E. SINCLAIR PHARMD, AT 1458 04/11/23 D. VANHOOK    Carbapenem resistance NDM NOT DETECTED NOT DETECTED Final   Carbapenem resist OXA 48 LIKE NOT DETECTED NOT DETECTED Final   Carbapenem resistance VIM NOT DETECTED NOT DETECTED Final    Comment: Performed at [facility], [address] [place], [place] [address]  Blood culture (routine x 2)     Status: None (Preliminary result)   Collection Time: 04/10/23 11:09 PM   Specimen: BLOOD  Result Value Ref Range Status   Specimen Description BLOOD SITE NOT SPECIFIED  Final   Special Requests   Final    BOTTLES DRAWN AEROBIC AND ANAEROBIC Blood Culture results may not be optimal due to an excessive volume of blood  received in culture bottles   Culture  Setup Time   Final    GRAM POSITIVE COCCI IN CLUSTERS IN BOTH AEROBIC AND ANAEROBIC BOTTLES GRAM NEGATIVE RODS AEROBIC BOTTLE ONLY Performed at [facility], [address], [place], [place] [address]    Culture GRAM POSITIVE COCCI IN CLUSTERS  Final   Report Status PENDING  Incomplete  Resp panel by RT-PCR (RSV, Flu A&B, Covid) Anterior Nasal Swab     Status: None   Collection Time: 04/10/23 11:47 PM   Specimen: Anterior Nasal Swab  Result Value Ref Range Status   SARS Coronavirus 2 by RT PCR NEGATIVE NEGATIVE Final   Influenza A by PCR NEGATIVE NEGATIVE Final   Influenza B by PCR NEGATIVE NEGATIVE Final    Comment: (NOTE) The Xpert Xpress SARS-CoV-2/FLU/RSV plus assay is intended as an aid in the diagnosis of influenza from Nasopharyngeal swab specimens and should not be used as a sole basis for treatment. Nasal washings and aspirates are unacceptable for Xpert Xpress SARS-CoV-2/FLU/RSV testing.  Fact Sheet for Patients: BloggerCourse.com  Fact Sheet for Healthcare Providers: SeriousBroker.it  This test is not yet approved or cleared by the [place] FDA and has been authorized for detection and/or diagnosis of SARS-CoV-2 by FDA under an Emergency Use Authorization (EUA). This EUA will remain in effect (meaning this test can be used) for the duration of the COVID-19 declaration under Section 564(b)(1) of the Act, 21 U.S.C. section 360bbb-3(b)(1), unless the authorization is terminated or revoked.     Resp Syncytial Virus by PCR NEGATIVE NEGATIVE Final    Comment: (NOTE) Fact Sheet for Patients: BloggerCourse.com  Fact Sheet for Healthcare Providers: SeriousBroker.it  This test is not yet approved or cleared by the [place] FDA and has been authorized for detection and/or diagnosis of SARS-CoV-2 by FDA under an  Emergency Use Authorization (EUA). This EUA will remain in effect (meaning this test can be used) for the duration of the COVID-19 declaration under Section 564(b)(1) of the Act, 21 U.S.C. section 360bbb-3(b)(1), unless the authorization is terminated or revoked.  Performed at [facility], [address], [place], [place] [address]   Respiratory (~20 pathogens) panel by PCR     Status: None   Collection  Time: 04/11/23  8:20 AM   Specimen: Nasopharyngeal Swab; Respiratory  Result Value Ref Range Status   Adenovirus NOT DETECTED NOT DETECTED Final   Coronavirus 229E NOT DETECTED NOT DETECTED Final    Comment: (NOTE) The Coronavirus on the Respiratory Panel, DOES NOT test for the novel  Coronavirus (2019 nCoV)    Coronavirus HKU1 NOT DETECTED NOT DETECTED Final   Coronavirus NL63 NOT DETECTED NOT DETECTED Final   Coronavirus OC43 NOT DETECTED NOT DETECTED Final   Metapneumovirus NOT DETECTED NOT DETECTED Final   Rhinovirus / Enterovirus NOT DETECTED NOT DETECTED Final   Influenza A NOT DETECTED NOT DETECTED Final   Influenza B NOT DETECTED NOT DETECTED Final   Parainfluenza Virus 1 NOT DETECTED NOT DETECTED Final   Parainfluenza Virus 2 NOT DETECTED NOT DETECTED Final   Parainfluenza Virus 3 NOT DETECTED NOT DETECTED Final   Parainfluenza Virus 4 NOT DETECTED NOT DETECTED Final   Respiratory Syncytial Virus NOT DETECTED NOT DETECTED Final   Bordetella pertussis NOT DETECTED NOT DETECTED Final   Bordetella Parapertussis NOT DETECTED NOT DETECTED Final   Chlamydophila pneumoniae NOT DETECTED NOT DETECTED Final   Mycoplasma pneumoniae NOT DETECTED NOT DETECTED Final    Comment: Performed at [facility], [address], [place], [place] [address]    Gardiner Barefoot, MD [facility] [email or web address] 04/11/2023, 4:48 PM

## 2023-04-11 NOTE — Progress Notes (Signed)
New Admission Note:  Arrival Method: By bed from ED around 0245 Mental Orientation: Alert and oriented x 4 Telemetry: Box 21, CCMD notified Assessment: Completed Skin: Completed, refer to flowsheets IV: R and L arm Pain: Denies Tubes: None Safety Measures: Safety Fall Prevention Plan was given, discussed and signed. Admission: Completed [facility] Orientation: Patient has been orientated to the room, unit and the staff. Family: None   Orders have been reviewed and implemented. Will continue to monitor the patient. Call light has been placed within reach and bed alarm has been activated.   Franky Macho, RN  Phone Number: [phone]

## 2023-04-11 NOTE — Evaluation (Signed)
Physical Therapy Evaluation Patient Details Name: Micheal Moore. MRN: [id number] DOB: [birth year] Today's Date: 04/11/2023  History of Present Illness  Pt is a 72 y/o M admitted on 04/10/23 after presenting with c/o cough, fever, N&V. Chest x-ray shows multifocal infiltrates. Pt is being treated for CAP. PMH: DVT, DM2, CHF  Clinical Impression  Pt seen for PT evaluation with pt agreeable to tx. Pt reports prior to admission he's been bedbound since April, toilets in the bed & wife provides assistance for peri hygiene. Pt reports he has 2 guys lift him to w/c if he needs to get to w/c for appointments. On this date, pt requires +1-2 total assist for rolling L<>R for peri hygiene 2/2 incontinent BM. Pt with wounds to BLE feet, dry skin, skin breakdown on buttocks - nurse made aware. At this time, pt appears to be at baseline level of mobility. Recommend hoyer lift with sling to decrease caregiver burden with bed<>chair transfers. PT to complete orders at this time, please re-consult if new needs arise.        Assistance Recommended at Discharge Frequent or constant Supervision/Assistance  If plan is discharge home, recommend the following:  Can travel by private vehicle  Two people to help with walking and/or transfers;Two people to help with bathing/dressing/bathroom;Help with stairs or ramp for entrance;Assist for transportation;Assistance with cooking/housework        Equipment Recommendations Other (comment) (hoyer lift with sling)  Recommendations for Other Services       Functional Status Assessment Patient has not had a recent decline in their functional status     Precautions / Restrictions Precautions Precautions: Fall Restrictions Weight Bearing Restrictions: No      Mobility  Bed Mobility Overal bed mobility: Needs Assistance Bed Mobility: Rolling Rolling: Total assist, +2 for physical assistance         General bed mobility comments: Requires assistance  to reposition feet, cuing for hand placement on bed rails, pt able to turn/rotate trunk but not fully turn to sidelying.    Transfers                        Ambulation/Gait                  Stairs            Wheelchair Mobility     Tilt Bed    Modified Rankin (Stroke Patients Only)       Balance                                             Pertinent Vitals/Pain Pain Assessment Pain Assessment: Faces Faces Pain Scale: Hurts little more Pain Location: feet/toes when bumped/touched Pain Descriptors / Indicators: Guarding, Grimacing, Discomfort Pain Intervention(s): Repositioned, Monitored during session    Home Living Family/patient expects to be discharged to:: Private residence Living Arrangements: Spouse/significant other;Children Available Help at Discharge: Family Type of Home: House             Additional Comments: Pt reports he lives with his wife.    Prior Function               Mobility Comments: Pt reports he's been bedbound since April, uses public transportation to get to appointments & when he has to get out of bed to w/c he has 2 guys lift  him to w/c. Pt reports he is able to assist with rolling prior to admission. ADLs Comments: Pt uses bathroom in bed (does not use bed pan) & wife assists with peri hygiene.     Hand Dominance        Extremity/Trunk Assessment   Upper Extremity Assessment Upper Extremity Assessment: Generalized weakness    Lower Extremity Assessment Lower Extremity Assessment: Generalized weakness (L great toe amputation, wound on R great toe, dry skin on feet. Unable to flex knee, pain when PT attempts to passively flex L knee. Significant extensor tightness in BLE knees.)    Cervical / Trunk Assessment Cervical / Trunk Assessment:  (skin breakdown on buttocks)  Communication   Communication: No difficulties  Cognition Arousal/Alertness: Awake/alert Behavior During  Therapy: WFL for tasks assessed/performed Overall Cognitive Status: No family/caregiver present to determine baseline cognitive functioning                                 General Comments: Pt is oriented to hospital, year but not month. Follows simple commands throughout session.        General Comments      Exercises     Assessment/Plan    PT Assessment Patient does not need any further PT services  PT Problem List         PT Treatment Interventions      PT Goals (Current goals can be found in the Care Plan section)  Acute Rehab PT Goals Patient Stated Goal: none stated PT Goal Formulation: With patient Time For Goal Achievement: 04/25/23 Potential to Achieve Goals: Fair    Frequency       Co-evaluation               AM-PAC PT "6 Clicks" Mobility  Outcome Measure Help needed turning from your back to your side while in a flat bed without using bedrails?: Total Help needed moving from lying on your back to sitting on the side of a flat bed without using bedrails?: Total Help needed moving to and from a bed to a chair (including a wheelchair)?: Total Help needed standing up from a chair using your arms (e.g., wheelchair or bedside chair)?: Total Help needed to walk in hospital room?: Total Help needed climbing 3-5 steps with a railing? : Total 6 Click Score: 6    End of Session   Activity Tolerance: Patient tolerated treatment well Patient left: in bed;with call bell/phone within reach;with bed alarm set        Time: 1535-1553 PT Time Calculation (min) (ACUTE ONLY): 18 min   Charges:   PT Evaluation $PT Eval Low Complexity: 1 Low   PT General Charges $$ ACUTE PT VISIT: 1 Visit         Aleda Grana, PT, DPT 04/11/23, 4:03 PM   Sandi Mariscal 04/11/2023, 4:01 PM

## 2023-04-11 NOTE — Progress Notes (Addendum)
Pharmacy Antibiotic Note  Micheal Moore. is a 72 y.o. male admitted on 04/10/2023 with pneumonia. Patient received azithromycin + ceftriaxone in the ED. 7/24 blood cultures positive for MRSA and unidentified Enterobacterales species. Renal function normal (CrCl 105.2 ml/min, SCr 0.78). Pharmacy has been consulted for vancomycin dosing.  Plan: Vancomycin 2000mg  IV loading dose x 1  Vancomycin 1500mg  IV Q12H (target AUC 400-550, predicted AUC 480) Plan to check level at steady state. Monitor renal function.   Weight: 94.8 kg (209 lb)  Temp (24hrs), Avg:98.8 F (37.1 C), Min:97.7 F (36.5 C), Max:99.9 F (37.7 C)  Recent Labs  Lab 04/10/23 2255 04/10/23 2302 04/11/23 0140 04/11/23 0236 04/11/23 0445  WBC 9.0  --   --   --  8.9  CREATININE 0.72  --   --  0.78  --   LATICACIDVEN  --  1.1 1.6  --   --     Estimated Creatinine Clearance: 105.2 mL/min (by C-G formula based on SCr of 0.78 mg/dL).    No Known Allergies  Antimicrobials this admission: Azithromycin 7/25 x1  Ceftriaxone 7/25 x1 Cefepime 2g IV Q8H 7/25 >>  Vancomycin 7/25 >>    Microbiology results: 7/24 BCx: MRSA, Enterobacteriales   Thank you for allowing pharmacy to be a part of this patient's care.  Lendon Ka, PharmD Candidate  04/11/2023 3:14 PM

## 2023-04-11 NOTE — Progress Notes (Signed)
  Carryover admission to the Day Admitter.  I discussed this case with the EDP, Lanae Crumbly, PA.  Per these discussions:   This is a  59 M with history of type 2 diabetes mellitus who is being admitted with community-acquired pneumonia after presenting with 2 days of cough, subjective fever, nausea, vomiting, with presenting chest x-ray showing evidence of multifocal infiltrates.  The patient reports a temperature max over the last day of 100.5.   In the ED today, he is afebrile, mildly tachycardic, normotensive and maintaining oxygen saturations in the low to mid 90s on room air.  No evidence of leukocytosis on presenting CBC.  COVID, RSV, influenza PCR all negative.  Started on azithromycin and Rocephin in the ED.  I have placed an order for inpatient admission to med/tele for further evaluation and management of the above.  I have placed some additional preliminary admit orders via the adult multi-morbid admission order set. I have also ordered continuation of azithromycin and Rocephin.  I also ordered flutter valve, incentive spirometry, procalcitonin level.  As the patient has had some nausea/vomiting, also ordered a Legionella urine antigen in addition to strep pneumonia urine antigen.  I ordered morning labs in the form of CMP, CBC, serum magnesium and phosphorus levels.    Newton Pigg, DO Hospitalist

## 2023-04-11 NOTE — Consult Note (Signed)
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  Trauma wound to left lower leg near Achilles.  All present on admission.  Wound type:unstageable pressure injury and trauma wound Pressure Injury POA: Yes Measurement: left heel:  2 cm x 1.5 cm slough to wound bed.  Left lower leg near achilles:  2 cm round lesion pink red wound bed Wound bed: moist Drainage (amount, consistency, odor) minimal serosanguinous  no odor Periwound: dry, thin skin Dressing procedure/placement/frequency: cleanse wounds to left heel and left lower leg with VASHE solution ([facility] # [id number]) and pat dry. Apply VASHE moist gauze and cover with foam dressing. Change gauze daily and foam every other day.   Will not follow at this time.  Please re-consult if needed.  Mike Gip MSN, RN, FNP-BC CWON Wound, Ostomy, Continence Nurse Outpatient [facility] [phone] Pager [phone]

## 2023-04-11 NOTE — Progress Notes (Signed)
OT Screen Note  Patient Details Name: Micheal Moore. MRN: [id number] DOB: 09/10/1951   Cancelled Treatment:    Reason Eval/Treat Not Completed: OT screened, no needs identified, will sign off (Pt at baseline, he has been bed bound and has assist with ADLs. OT will be signing off but please reconsult if functional status changes.)  04/11/2023  AB, OTR/L  Acute Rehabilitation Services  Office: [phone]   Tristan Schroeder 04/11/2023, 4:08 PM

## 2023-04-12 ENCOUNTER — Inpatient Hospital Stay (HOSPITAL_COMMUNITY): Payer: Medicare HMO

## 2023-04-12 DIAGNOSIS — J189 Pneumonia, unspecified organism: Secondary | ICD-10-CM

## 2023-04-12 DIAGNOSIS — E119 Type 2 diabetes mellitus without complications: Secondary | ICD-10-CM | POA: Diagnosis not present

## 2023-04-12 DIAGNOSIS — S81809A Unspecified open wound, unspecified lower leg, initial encounter: Secondary | ICD-10-CM

## 2023-04-12 DIAGNOSIS — R7881 Bacteremia: Secondary | ICD-10-CM

## 2023-04-12 DIAGNOSIS — Z794 Long term (current) use of insulin: Secondary | ICD-10-CM

## 2023-04-12 DIAGNOSIS — B9689 Other specified bacterial agents as the cause of diseases classified elsewhere: Secondary | ICD-10-CM | POA: Diagnosis not present

## 2023-04-12 DIAGNOSIS — R509 Fever, unspecified: Secondary | ICD-10-CM | POA: Diagnosis not present

## 2023-04-12 LAB — GLUCOSE, CAPILLARY
Glucose-Capillary: 114 mg/dL — ABNORMAL HIGH (ref 70–99)
Glucose-Capillary: 152 mg/dL — ABNORMAL HIGH (ref 70–99)
Glucose-Capillary: 204 mg/dL — ABNORMAL HIGH (ref 70–99)
Glucose-Capillary: 160 mg/dL — ABNORMAL HIGH (ref 70–99)

## 2023-04-12 MED ORDER — VANCOMYCIN HCL 1250 MG/250ML IV SOLN
1250.0000 mg | Freq: Two times a day (BID) | INTRAVENOUS | Status: DC
  Administered 2023-04-12 – 2023-04-16 (×9): 1250 mg via INTRAVENOUS
  Filled 2023-04-12 (×10): qty 250

## 2023-04-12 MED ORDER — POTASSIUM CHLORIDE CRYS ER 20 MEQ PO TBCR
40.0000 meq | EXTENDED_RELEASE_TABLET | Freq: Once | ORAL | Status: AC
  Administered 2023-04-12: 40 meq via ORAL
  Filled 2023-04-12: qty 2

## 2023-04-12 NOTE — Plan of Care (Signed)
Patient alert/oriented X4. Patient compliant with medication administration and tolerated IV abx. Patient refused repositioning in bed. Patient VSS, no complaints at this time.  Problem: Education: Goal: Knowledge of General Education information will improve Description: Including pain rating scale, medication(s)/side effects and non-pharmacologic comfort measures Outcome: Progressing   Problem: Health Behavior/Discharge Planning: Goal: Ability to manage health-related needs will improve Outcome: Progressing   Problem: Clinical Measurements: Goal: Ability to maintain clinical measurements within normal limits will improve Outcome: Progressing   Problem: Clinical Measurements: Goal: Will remain free from infection Outcome: Progressing   Problem: Clinical Measurements: Goal: Diagnostic test results will improve Outcome: Progressing   Problem: Clinical Measurements: Goal: Respiratory complications will improve Outcome: Progressing   Problem: Clinical Measurements: Goal: Cardiovascular complication will be avoided Outcome: Progressing   Problem: Activity: Goal: Risk for activity intolerance will decrease Outcome: Progressing   Problem: Nutrition: Goal: Adequate nutrition will be maintained Outcome: Progressing   Problem: Coping: Goal: Level of anxiety will decrease Outcome: Progressing   Problem: Elimination: Goal: Will not experience complications related to bowel motility Outcome: Progressing   Problem: Elimination: Goal: Will not experience complications related to urinary retention Outcome: Progressing   Problem: Pain Managment: Goal: General experience of comfort will improve Outcome: Progressing   Problem: Safety: Goal: Ability to remain free from injury will improve Outcome: Progressing   Problem: Skin Integrity: Goal: Risk for impaired skin integrity will decrease Outcome: Progressing   Problem: Activity: Goal: Ability to tolerate increased  activity will improve Outcome: Progressing   Problem: Clinical Measurements: Goal: Ability to maintain a body temperature in the normal range will improve Outcome: Progressing   Problem: Respiratory: Goal: Ability to maintain adequate ventilation will improve Outcome: Progressing   Problem: Respiratory: Goal: Ability to maintain a clear airway will improve Outcome: Progressing   Problem: Education: Goal: Ability to describe self-care measures that may prevent or decrease complications (Diabetes Survival Skills Education) will improve Outcome: Progressing   Problem: Education: Goal: Individualized Educational Video(s) Outcome: Progressing   Problem: Coping: Goal: Ability to adjust to condition or change in health will improve Outcome: Progressing   Problem: Fluid Volume: Goal: Ability to maintain a balanced intake and output will improve Outcome: Progressing   Problem: Health Behavior/Discharge Planning: Goal: Ability to identify and utilize available resources and services will improve Outcome: Progressing   Problem: Health Behavior/Discharge Planning: Goal: Ability to manage health-related needs will improve Outcome: Progressing   Problem: Metabolic: Goal: Ability to maintain appropriate glucose levels will improve Outcome: Progressing   Problem: Nutritional: Goal: Maintenance of adequate nutrition will improve Outcome: Progressing   Problem: Nutritional: Goal: Progress toward achieving an optimal weight will improve Outcome: Progressing   Problem: Skin Integrity: Goal: Risk for impaired skin integrity will decrease Outcome: Progressing   Problem: Tissue Perfusion: Goal: Adequacy of tissue perfusion will improve Outcome: Progressing

## 2023-04-12 NOTE — Progress Notes (Signed)
Ok to replace k 3.4 with KCL x1 per Dr. Cristopher Peru, PharmD, BCIDP, AAHIVP, CPP Infectious Disease Pharmacist 04/12/2023 8:27 AM

## 2023-04-12 NOTE — Progress Notes (Signed)
Pharmacy Antibiotic Note  Micheal Moore. is a 72 y.o. male admitted on 04/10/2023 with pneumonia. Patient received azithromycin + ceftriaxone in the ED. 7/24 blood cultures positive for MRSA and unidentified Enterobacterales species. Renal function normal (CrCl 105.2 ml/min, SCr 0.78). Pharmacy has been consulted for vancomycin dosing.  We will adjust vanc slightly.   Plan: Change vancomycin 1250mg  IV Q12H (AUC 440, scr 0.81) Plan to check level at steady state  Weight: 87.3 kg (192 lb 7.4 oz)  Temp (24hrs), Avg:99.6 F (37.6 C), Min:98.8 F (37.1 C), Max:100.5 F (38.1 C)  Recent Labs  Lab 04/10/23 2255 04/10/23 2302 04/11/23 0140 04/11/23 0236 04/11/23 0445 04/12/23 0254  WBC 9.0  --   --   --  8.9  --   CREATININE 0.72  --   --  0.78  --  0.81  LATICACIDVEN  --  1.1 1.6  --   --   --     Estimated Creatinine Clearance: 101.8 mL/min (by C-G formula based on SCr of 0.81 mg/dL).    No Known Allergies  Antimicrobials this admission: Azithromycin 7/25 x1  Ceftriaxone 7/25 x1 Cefepime 2g IV Q8H 7/25 >>  Vancomycin 7/25 >>    Microbiology results: 7/24 BCx: MRSA, Enterobacteriales   Ulyses Southward, PharmD, [place], AAHIVP, CPP Infectious Disease Pharmacist 04/12/2023 8:58 AM

## 2023-04-12 NOTE — Progress Notes (Signed)
PROGRESS NOTE    Micheal Moore.  UJW:[id number] DOB: 1951-02-11 DOA: 04/10/2023 PCP: [name], MD    Brief Narrative:   Micheal Moore. is a 72 y.o. male with medical history significant of CAD s/p PCI, syncope, orthostatic hypotension, diabetes mellitus type 2, DVT of right leg, peripheral artery disease, right elbow melanoma s/p excision who presents with nausea and vomiting.  History is obtained from patient and with the assistance of his wife over the phone.  Patient states that he does not ambulate and is bedbound.  However, over the phone wife makes note that no one ever signed off to say that he cannot walk.  She reports that she takes care of him and cleans him, but does expect him to do certain things for himself such as giving himself meds as he can see.  She reports that he lays in a hospital bed and eat while laying flat when he knows he should be sitting up.  Also makes note he possibly does not always take his medications as he should despite her laying them out for him.  Last night patient developed acute onset of nausea and vomiting, but had been in his normal state of health prior to that as he had been seen by his primary care provider just 2 days ago.  He had not had any cough.  In route with EMS patient was noted to be febrile up to 100.5 F.  Found to have bacteremia.    Assessment and Plan: Suspected aspiration pneumonia Acute.  Patient presents with complaints of nausea and vomiting.  Chest x-ray noted central peribronchial wall thickening within some interstitial and patchy opacities in both lower lungs concerning for atypical/viral infection.  Influenza, COVID-19, and RSV screening were negative.  Procalcitonin was elevated at 0.4.  Based off history suspect possible aspiration pneumonia/bacterial pneumonia. -Follow-up blood and sputum cultures -Follow-up urine strep and urine Legionella -Incentive spirometry and flutter valve -see below  abx -Mucinex   Bacteremia -ID consult -Vancomycin + cefepime -Repeat blood cultures -TTE  Nausea and vomiting -resolved   Orthostatic hypotension Chronic.  -Continue midodrine and fludrocortisone   Elevated troponin CAD Acute.  High-sensitivity troponin 14->18.  Patient with recent inferior STEMI on 4/30 with stent placement..  Not on beta-blocker due to hypotension. -Continue Brilinta and aspirin   Hypokalemia -replete   Uncontrolled diabetes mellitus type 2, with long-term use of insulin Last available hemoglobin A1c was 13.3 on  01/15/2023.  Patient reports taking Lantus 30-35 units nightly, NovoLog 8 to 10 units of 3 times daily with meals. -SSI   Normocytic anemia -trend   Peripheral vascular disease Patient with prior left abnormal ABI toe brachial index where he had aortogram with bilateral runoff with no intervention needed.  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 -Continue Brilinta and statin   Hyperlipidemia -Continue Crestor   History of DVT Patient previously had been on Eliquis, but was not currently taking and just on aspirin.   Pressure ulcer of the left heel -Wound care consult   Debility -PT/OT to eval and treat -Transitions of care for need of placement      DVT prophylaxis: enoxaparin (LOVENOX) injection 40 mg Start: 04/11/23 1000 SCDs Start: 04/11/23 0133    Code Status: Full Code   Disposition Plan:  Level of care: Telemetry Medical Status is: Inpatient Remains inpatient appropriate     Consultants:  ID   Subjective: No SOB, no CP Wound on  left heel  Objective: Vitals:   04/12/23 0411 04/12/23 0413 04/12/23 0500 04/12/23 0810  BP: 129/74 129/74  133/71  Pulse: 96 96  93  Resp:  18  16  Temp:  98.9 F (37.2 C)  98.8 F (37.1 C)  TempSrc:      SpO2: 99% 99%  97%  Weight:   87.3 kg     Intake/Output Summary (Last 24 hours) at 04/12/2023 1229 Last data filed at 04/12/2023 1143 Gross  per 24 hour  Intake 1039.87 ml  Output 1750 ml  Net -710.13 ml   Filed Weights   04/10/23 2212 04/12/23 0500  Weight: 94.8 kg 87.3 kg    Examination:   General: Appearance:    Well developed, well nourished male in no acute distress     Lungs:     respirations unlabored  Heart:    Normal heart rate. .    MS:   amputated.    Neurologic:   Awake, alert       Data Reviewed: I have personally reviewed following labs and imaging studies  CBC: Recent Labs  Lab 04/10/23 2255 04/11/23 0445  WBC 9.0 8.9  NEUTROABS 7.6 7.2  HGB 12.7* 12.6*  HCT 39.0 38.3*  MCV 94.7 92.1  PLT 272 212   Basic Metabolic Panel: Recent Labs  Lab 04/10/23 2255 04/11/23 0236 04/12/23 0254  NA 133* 132* 132*  K 3.1* 3.2* 3.4*  CL 99 97* 95*  CO2 21* 21* 25  GLUCOSE 119* 140* 141*  BUN 18 20 29*  CREATININE 0.72 0.78 0.81  CALCIUM 8.7* 8.7* 8.8*  MG  --  1.9  --   PHOS  --  3.8  --    GFR: Estimated Creatinine Clearance: 101.8 mL/min (by C-G formula based on SCr of 0.81 mg/dL). Liver Function Tests: Recent Labs  Lab 04/10/23 2255 04/11/23 0236  AST 14* 14*  ALT 12 12  ALKPHOS 76 74  BILITOT 1.4* 1.4*  PROT 7.0 6.7  ALBUMIN 3.3* 3.3*   Recent Labs  Lab 04/10/23 2255  LIPASE 23   No results for input(s): "AMMONIA" in the last 168 hours. Coagulation Profile: No results for input(s): "INR", "PROTIME" in the last 168 hours. Cardiac Enzymes: No results for input(s): "CKTOTAL", "CKMB", "CKMBINDEX", "TROPONINI" in the last 168 hours. BNP (last 3 results) No results for input(s): "PROBNP" in the last 8760 hours. HbA1C: No results for input(s): "HGBA1C" in the last 72 hours. CBG: Recent Labs  Lab 04/11/23 1121 04/11/23 1629 04/11/23 2026 04/12/23 0720 04/12/23 1115  GLUCAP 172* 182* 150* 114* 152*   Lipid Profile: No results for input(s): "CHOL", "HDL", "LDLCALC", "TRIG", "CHOLHDL", "LDLDIRECT" in the last 72 hours. Thyroid Function Tests: No results for input(s):  "TSH", "T4TOTAL", "FREET4", "T3FREE", "THYROIDAB" in the last 72 hours. Anemia Panel: No results for input(s): "VITAMINB12", "FOLATE", "FERRITIN", "TIBC", "IRON", "RETICCTPCT" in the last 72 hours. Sepsis Labs: Recent Labs  Lab 04/10/23 2302 04/11/23 0140 04/11/23 0236  PROCALCITON  --   --  0.40  LATICACIDVEN 1.1 1.6  --     Recent Results (from the past 240 hour(s))  Blood culture (routine x 2)     Status: Abnormal (Preliminary result)   Collection Time: 04/10/23 10:55 PM   Specimen: BLOOD  Result Value Ref Range Status   Specimen Description BLOOD SITE NOT SPECIFIED  Final   Special Requests   Final    BOTTLES DRAWN AEROBIC AND ANAEROBIC Blood Culture results may not be optimal due to  an excessive volume of blood received in culture bottles   Culture  Setup Time   Final    GRAM NEGATIVE RODS GRAM POSITIVE COCCI IN CLUSTERS IN BOTH AEROBIC AND ANAEROBIC BOTTLES CRITICAL RESULT CALLED TO, READ BACK BY AND VERIFIED WITH: E. SINCLAIR PHARMD, AT 1458 04/11/23 D. VANHOOK    Culture (A)  Final    STAPHYLOCOCCUS AUREUS PROVIDENCIA RETTGERI SUSCEPTIBILITIES TO FOLLOW Performed at [facility], [address], [place], [place] [address]    Report Status PENDING  Incomplete  Blood Culture ID Panel (Reflexed)     Status: Abnormal   Collection Time: 04/10/23 10:55 PM  Result Value Ref Range Status   Enterococcus faecalis NOT DETECTED NOT DETECTED Final   Enterococcus Faecium NOT DETECTED NOT DETECTED Final   Listeria monocytogenes NOT DETECTED NOT DETECTED Final   Staphylococcus species DETECTED (A) NOT DETECTED Final    Comment: CRITICAL RESULT CALLED TO, READ BACK BY AND VERIFIED WITH: E. SINCLAIR PHARMD, AT 1458 04/11/23 D. VANHOOK    Staphylococcus aureus (BCID) DETECTED (A) NOT DETECTED Final    Comment: Methicillin (oxacillin)-resistant Staphylococcus aureus (MRSA). MRSA is predictably resistant to beta-lactam antibiotics (except ceftaroline). Preferred therapy is  vancomycin unless clinically contraindicated. Patient requires contact precautions if  hospitalized. CRITICAL RESULT CALLED TO, READ BACK BY AND VERIFIED WITH: E. SINCLAIR PHARMD, AT 1458 04/11/23 D. VANHOOK    Staphylococcus epidermidis NOT DETECTED NOT DETECTED Final   Staphylococcus lugdunensis NOT DETECTED NOT DETECTED Final   Streptococcus species NOT DETECTED NOT DETECTED Final   Streptococcus agalactiae NOT DETECTED NOT DETECTED Final   Streptococcus pneumoniae NOT DETECTED NOT DETECTED Final   Streptococcus pyogenes NOT DETECTED NOT DETECTED Final   A.calcoaceticus-baumannii NOT DETECTED NOT DETECTED Final   Bacteroides fragilis NOT DETECTED NOT DETECTED Final   Enterobacterales DETECTED (A) NOT DETECTED Final    Comment: Enterobacterales represent a large order of gram negative bacteria, not a single organism. Refer to culture for further identification. CRITICAL RESULT CALLED TO, READ BACK BY AND VERIFIED WITH: E. SINCLAIR PHARMD, AT 1458 04/11/23 D. VANHOOK    Enterobacter cloacae complex NOT DETECTED NOT DETECTED Final   Escherichia coli NOT DETECTED NOT DETECTED Final   Klebsiella aerogenes NOT DETECTED NOT DETECTED Final   Klebsiella oxytoca NOT DETECTED NOT DETECTED Final   Klebsiella pneumoniae NOT DETECTED NOT DETECTED Final   Proteus species NOT DETECTED NOT DETECTED Final   Salmonella species NOT DETECTED NOT DETECTED Final   Serratia marcescens NOT DETECTED NOT DETECTED Final   Haemophilus influenzae NOT DETECTED NOT DETECTED Final   Neisseria meningitidis NOT DETECTED NOT DETECTED Final   Pseudomonas aeruginosa NOT DETECTED NOT DETECTED Final   Stenotrophomonas maltophilia NOT DETECTED NOT DETECTED Final   Candida albicans NOT DETECTED NOT DETECTED Final   Candida auris NOT DETECTED NOT DETECTED Final   Candida glabrata NOT DETECTED NOT DETECTED Final   Candida krusei NOT DETECTED NOT DETECTED Final   Candida parapsilosis NOT DETECTED NOT DETECTED Final   Candida  tropicalis NOT DETECTED NOT DETECTED Final   Cryptococcus neoformans/gattii NOT DETECTED NOT DETECTED Final   CTX-M ESBL NOT DETECTED NOT DETECTED Final   Carbapenem resistance IMP NOT DETECTED NOT DETECTED Final   Carbapenem resistance KPC NOT DETECTED NOT DETECTED Final   Meth resistant mecA/C and MREJ DETECTED (A) NOT DETECTED Final    Comment: CRITICAL RESULT CALLED TO, READ BACK BY AND VERIFIED WITH: E. SINCLAIR PHARMD, AT 1458 04/11/23 D. VANHOOK    Carbapenem resistance NDM  NOT DETECTED NOT DETECTED Final   Carbapenem resist OXA 48 LIKE NOT DETECTED NOT DETECTED Final   Carbapenem resistance VIM NOT DETECTED NOT DETECTED Final    Comment: Performed at [facility], [address], [place], [place] [address]  Blood culture (routine x 2)     Status: Abnormal (Preliminary result)   Collection Time: 04/10/23 11:09 PM   Specimen: BLOOD  Result Value Ref Range Status   Specimen Description BLOOD SITE NOT SPECIFIED  Final   Special Requests   Final    BOTTLES DRAWN AEROBIC AND ANAEROBIC Blood Culture results may not be optimal due to an excessive volume of blood received in culture bottles   Culture  Setup Time   Final    GRAM POSITIVE COCCI IN CLUSTERS IN BOTH AEROBIC AND ANAEROBIC BOTTLES GRAM NEGATIVE RODS AEROBIC BOTTLE ONLY Performed at [facility], [address] [place], [place] [address]    Culture STAPHYLOCOCCUS AUREUS PROVIDENCIA RETTGERI  (A)  Final   Report Status PENDING  Incomplete  Resp panel by RT-PCR (RSV, Flu A&B, Covid) Anterior Nasal Swab     Status: None   Collection Time: 04/10/23 11:47 PM   Specimen: Anterior Nasal Swab  Result Value Ref Range Status   SARS Coronavirus 2 by RT PCR NEGATIVE NEGATIVE Final   Influenza A by PCR NEGATIVE NEGATIVE Final   Influenza B by PCR NEGATIVE NEGATIVE Final    Comment: (NOTE) The Xpert Xpress SARS-CoV-2/FLU/RSV plus assay is intended as an aid in the diagnosis of influenza from Nasopharyngeal swab  specimens and should not be used as a sole basis for treatment. Nasal washings and aspirates are unacceptable for Xpert Xpress SARS-CoV-2/FLU/RSV testing.  Fact Sheet for Patients: BloggerCourse.com  Fact Sheet for Healthcare Providers: SeriousBroker.it  This test is not yet approved or cleared by the [place] FDA and has been authorized for detection and/or diagnosis of SARS-CoV-2 by FDA under an Emergency Use Authorization (EUA). This EUA will remain in effect (meaning this test can be used) for the duration of the COVID-19 declaration under Section 564(b)(1) of the Act, 21 U.S.C. section 360bbb-3(b)(1), unless the authorization is terminated or revoked.     Resp Syncytial Virus by PCR NEGATIVE NEGATIVE Final    Comment: (NOTE) Fact Sheet for Patients: BloggerCourse.com  Fact Sheet for Healthcare Providers: SeriousBroker.it  This test is not yet approved or cleared by the [place] FDA and has been authorized for detection and/or diagnosis of SARS-CoV-2 by FDA under an Emergency Use Authorization (EUA). This EUA will remain in effect (meaning this test can be used) for the duration of the COVID-19 declaration under Section 564(b)(1) of the Act, 21 U.S.C. section 360bbb-3(b)(1), unless the authorization is terminated or revoked.  Performed at [facility], [address], [place], [place] [address]   Respiratory (~20 pathogens) panel by PCR     Status: None   Collection Time: 04/11/23  8:20 AM   Specimen: Nasopharyngeal Swab; Respiratory  Result Value Ref Range Status   Adenovirus NOT DETECTED NOT DETECTED Final   Coronavirus 229E NOT DETECTED NOT DETECTED Final    Comment: (NOTE) The Coronavirus on the Respiratory Panel, DOES NOT test for the novel  Coronavirus (2019 nCoV)    Coronavirus HKU1 NOT DETECTED NOT DETECTED Final   Coronavirus NL63 NOT  DETECTED NOT DETECTED Final   Coronavirus OC43 NOT DETECTED NOT DETECTED Final   Metapneumovirus NOT DETECTED NOT DETECTED Final   Rhinovirus / Enterovirus NOT  DETECTED NOT DETECTED Final   Influenza A NOT DETECTED NOT DETECTED Final   Influenza B NOT DETECTED NOT DETECTED Final   Parainfluenza Virus 1 NOT DETECTED NOT DETECTED Final   Parainfluenza Virus 2 NOT DETECTED NOT DETECTED Final   Parainfluenza Virus 3 NOT DETECTED NOT DETECTED Final   Parainfluenza Virus 4 NOT DETECTED NOT DETECTED Final   Respiratory Syncytial Virus NOT DETECTED NOT DETECTED Final   Bordetella pertussis NOT DETECTED NOT DETECTED Final   Bordetella Parapertussis NOT DETECTED NOT DETECTED Final   Chlamydophila pneumoniae NOT DETECTED NOT DETECTED Final   Mycoplasma pneumoniae NOT DETECTED NOT DETECTED Final    Comment: Performed at [facility], [address], [place], [place] [address]         Radiology Studies: DG Chest Port 1 View  Result Date: 04/10/2023 CLINICAL DATA:  Cough and fever EXAM: PORTABLE CHEST 1 VIEW COMPARISON:  Chest x-ray 10/01/2022 FINDINGS: There is central peribronchial wall thickening with some interstitial and patchy opacities in both lower lungs. There is no focal lung consolidation, pleural effusion or pneumothorax. Cardiomediastinal silhouette is within normal limits. IMPRESSION: Central peribronchial wall thickening with some interstitial and patchy opacities in both lower lungs. Findings are nonspecific and could be seen in the setting of atypical/viral infection. Electronically Signed   By: Darliss Cheney M.D.   On: 04/10/2023 23:23        Scheduled Meds:  aspirin EC  81 mg Oral Daily   empagliflozin  10 mg Oral Daily   enoxaparin (LOVENOX) injection  40 mg Subcutaneous Q24H   feeding supplement (GLUCERNA SHAKE)  237 mL Oral TID BM   fludrocortisone  0.2 mg Oral Daily   guaiFENesin  600 mg Oral BID   insulin aspart  0-9 Units Subcutaneous TID WC   insulin aspart  5  Units Subcutaneous TID WC   insulin glargine-yfgn  20 Units Subcutaneous QHS   midodrine  7.5 mg Oral TID WC   pantoprazole  40 mg Oral Daily   rosuvastatin  5 mg Oral Once per day on Monday Thursday   ticagrelor  90 mg Oral BID   Continuous Infusions:  ceFEPime (MAXIPIME) IV Stopped (04/12/23 0824)   vancomycin       LOS: 1 day    Time spent: 4 minutes spent on chart review, discussion with nursing staff, consultants, updating family and interview/physical exam; more than 50% of that time was spent in counseling and/or coordination of care.    Joseph Art, DO [facility] Available via Epic secure chat 7am-7pm After these hours, please refer to coverage provider listed on [email or web address] 04/12/2023, 12:29 PM

## 2023-04-12 NOTE — TOC CM/SW Note (Signed)
Transition of Care [facility]) - Inpatient Brief Assessment   Patient Details  Name: Micheal Moore. MRN: [id number] Date of Birth: 06-Mar-1951  Transition of Care [facility]) CM/SW Contact:    Tom-Johnson, Hershal Coria, RN Phone Number: 04/12/2023, 5:07 PM   Clinical Narrative:  Patient presented to the ED with Cough, Fever, Nausea and Vomiting.  Chest x-ray shows Multifocal Infiltrates, admit for CAP, on IV abx.    From home with wife and son, wife is primary caregiver. Patient states he is bedbound after previous fall.  Has hasp bed  and walker at home.  CM informed by Frances Furbish that patient was referred for RN/PT disciplines outpatient and resumption of care order needed. Order placed and info on AVS.  PCP is  [name], MD and uses [facility] on [address].   CM will continue to follow as patient progresses with care towards discharge.           Transition of Care Asessment: Insurance and Status: Insurance coverage has been reviewed Patient has primary care physician: Yes Home environment has been reviewed: Yes Prior level of function:: Bedbound Prior/Current Home Services: Current home services (Wife is caregiver, has outpatietn referral forRN/PT with [employer] [employer] Social Determinants of Health Reivew: SDOH reviewed no interventions necessary Readmission risk has been reviewed: Yes Transition of care needs: transition of care needs identified, TOC will continue to follow

## 2023-04-12 NOTE — Progress Notes (Signed)
Speech Language Pathology Treatment: Dysphagia  Patient Details Name: Micheal Moore. MRN: [id number] DOB: 06-16-1951 Today's Date: 04/12/2023 Time: 4098-1191 SLP Time Calculation (min) (ACUTE ONLY): 8 min  Assessment / Plan / Recommendation Clinical Impression  Pt seen for reinforcement of education regarding aspiration precautions. SLP instructed pt to position himself fully upright and remain upright 30 minutes after eating and drinking. Pt effectively demonstrated understanding evidenced by the teach back method. SLP also reinforced importance of thorough oral care to avoid infection. Pt verbalized understanding. No further SLP f/u is necessary. Will s/o at this time.    HPI HPI: Pt is a 72 yo male presenting to ED 7/25 presenting with nausea, vomiting, and fever. Per MD note, pt's wife reports pt does not ambulate and is bedbound. She also reports that he lays flat in bed to eat/drink. CXR revealed central peribronchial wall thickening with some interstitial and patchy opacities in both lower lobes. PMH includes cancer, T2DM, dizziness with frequent falls, DVT, syncope      SLP Plan  All goals met      Recommendations for follow up therapy are one component of a multi-disciplinary discharge planning process, led by the attending physician.  Recommendations may be updated based on patient status, additional functional criteria and insurance authorization.    Recommendations  Diet recommendations: Regular;Thin liquid Liquids provided via: Cup;Straw Medication Administration: Whole meds with liquid Supervision: Patient able to self feed Compensations: Slow rate;Small sips/bites Postural Changes and/or Swallow Maneuvers: Seated upright 90 degrees;Upright 30-60 min after meal                  Oral care BID   PRN Dysphagia, pharyngeal phase (R13.13)     All goals met     Gwynneth Aliment, M.A., CF-SLP Speech Language Pathology, Acute Rehabilitation  Services  Secure Chat preferred [phone]   04/12/2023, 3:17 PM

## 2023-04-12 NOTE — Evaluation (Signed)
Clinical/Bedside Swallow Evaluation Patient Details  Name: Micheal Moore. MRN: [id number] Date of Birth: 11/14/1950  Today's Date: 04/12/2023 Time: SLP Start Time (ACUTE ONLY): 1215 SLP Stop Time (ACUTE ONLY): 1237 SLP Time Calculation (min) (ACUTE ONLY): 22 min  Past Medical History:  Past Medical History:  Diagnosis Date   Cancer (HCC)    right elbow melanoma   Diabetes mellitus without complication (HCC)    Type II   Dizziness    DVT (deep venous thrombosis) (HCC)    right leg   Frequent falls    Near syncope    Syncope    Past Surgical History:  Past Surgical History:  Procedure Laterality Date   AMPUTATION TOE Left 04/28/2020   Procedure: AMPUTATION LEFT GREAT TOE;  Surgeon: Nadara Mustard, MD;  Location: [facility];  Service: Orthopedics;  Laterality: Left;   CORONARY/GRAFT ACUTE MI REVASCULARIZATION N/A 01/15/2023   Procedure: Coronary/Graft Acute MI Revascularization;  Surgeon: Orbie Pyo, MD;  Location: [facility];  Service: Cardiovascular;  Laterality: N/A;   INTRAMEDULLARY (IM) NAIL INTERTROCHANTERIC Right 02/23/2022   Procedure: INTRAMEDULLARY (IM) NAIL INTERTROCHANTRIC, RIGHT;  Surgeon: Samson Frederic, MD;  Location: [facility];  Service: Orthopedics;  Laterality: Right;   LEFT HEART CATH AND CORONARY ANGIOGRAPHY N/A 01/15/2023   Procedure: LEFT HEART CATH AND CORONARY ANGIOGRAPHY;  Surgeon: Orbie Pyo, MD;  Location: [facility];  Service: Cardiovascular;  Laterality: N/A;   LOWER EXTREMITY ANGIOGRAPHY Bilateral 01/18/2023   Procedure: Lower Extremity Angiography;  Surgeon: Nada Libman, MD;  Location: [facility];  Service: Cardiovascular;  Laterality: Bilateral;   MELANOMA EXCISION WITH SENTINEL LYMPH NODE BIOPSY Right 08/19/2020   Procedure: WIDE LOCAL EXCISION RIGHT ELBOW MELANOMA WITH RIGHT AXILLARY SENTINEL LYMPH NODE BIOPSY;  Surgeon: Fritzi Mandes, MD;  Location: [facility];  Service: General;  Laterality: Right;  2nd incision in  axilla   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   Procedure: TOTAL HIP ARTHROPLASTY ANTERIOR APPROACH;  Surgeon: Samson Frederic, MD;  Location: [facility];  Service: Orthopedics;  Laterality: Left;   TOTAL HIP ARTHROPLASTY Left 08/24/2022   Procedure: OPEN REDUCTION, HEAD AND LINER EXCHANGE;  Surgeon: Samson Frederic, MD;  Location: [facility];  Service: Orthopedics;  Laterality: Left;   HPI:  Pt is a 72 yo male presenting to ED 7/25 presenting with nausea, vomiting, and fever. Per MD note, pt's wife reports pt does not ambulate and is bedbound. She also reports that he lays flat in bed to eat/drink. CXR revealed central peribronchial wall thickening with some interstitial and patchy opacities in both lower lobes. PMH includes cancer, T2DM, dizziness with frequent falls, DVT, syncope    Assessment / Plan / Recommendation  Clinical Impression  Pt reports typically having no difficulty swallowng at home, although note he does not pay attention to if he experiences coughing during a meal. Oral motor exam WFL, although note missing dentition. Pt had just finished full lunch tray upon SLP arrival. He coughed and cleared his throat throughout all trials of thin liquids, purees, and solids. Pt hesistant to maintain a posture adequate for PO trials and required prompting to move [facility] to a more upright position. SLP then attempted trials of nectar thick liquids with a delayed cough noted. Given CXR findings, recommend MBS. Will f/u to complete as scheduling allows. SLP Visit Diagnosis: Dysphagia, unspecified (R13.10)    Aspiration Risk  Moderate aspiration risk    Diet Recommendation NPO  Other  Recommendations Oral Care Recommendations: Oral care BID    Recommendations for follow up therapy are one component of a multi-disciplinary discharge planning process, led by the attending physician.  Recommendations may be updated based on patient status, additional functional criteria and insurance  authorization.  Follow up Recommendations Outpatient SLP      Assistance Recommended at Discharge    Functional Status Assessment Patient has had a recent decline in their functional status and demonstrates the ability to make significant improvements in function in a reasonable and predictable amount of time.  Frequency and Duration min 2x/week  2 weeks       Prognosis Prognosis for improved oropharyngeal function: Good Barriers to Reach Goals: Time post onset      Swallow Study   General HPI: Pt is a 72 yo male presenting to ED 7/25 presenting with nausea, vomiting, and fever. Per MD note, pt's wife reports pt does not ambulate and is bedbound. She also reports that he lays flat in bed to eat/drink. CXR revealed central peribronchial wall thickening with some interstitial and patchy opacities in both lower lobes. PMH includes cancer, T2DM, dizziness with frequent falls, DVT, syncope Type of Study: Bedside Swallow Evaluation Previous Swallow Assessment: none in chart Diet Prior to this Study: Regular;Thin liquids (Level 0) Temperature Spikes Noted: Yes Respiratory Status: Room air History of Recent Intubation: No Behavior/Cognition: Alert;Cooperative;Pleasant mood Oral Cavity Assessment: Within Functional Limits Oral Care Completed by SLP: No Oral Cavity - Dentition: Poor condition;Missing dentition Vision: Functional for self-feeding Self-Feeding Abilities: Able to feed self Patient Positioning: Upright in bed Baseline Vocal Quality: Normal Volitional Cough: Congested Volitional Swallow: Able to elicit    Oral/Motor/Sensory Function Overall Oral Motor/Sensory Function: Within functional limits   Ice Chips     Thin Liquid Thin Liquid: Impaired Presentation: Straw;Self Fed Pharyngeal  Phase Impairments: Throat Clearing - Immediate;Cough - Immediate    Nectar Thick Nectar Thick Liquid: Impaired Presentation: Cup;Self Fed Pharyngeal Phase Impairments: Throat Clearing -  Immediate;Cough - Immediate   Honey Thick     Puree Puree: Within functional limits   Solid     Solid: Impaired Presentation: Self Fed Pharyngeal Phase Impairments: Cough - Delayed      Gwynneth Aliment, M.A., CF-SLP Speech Language Pathology, Acute Rehabilitation Services  Secure Chat preferred [phone]  04/12/2023,12:55 PM

## 2023-04-12 NOTE — Progress Notes (Signed)
Modified Barium Swallow Study  Patient Details  Name: Micheal Moore. MRN: [id number] Date of Birth: [birth year]  Today's Date: 04/12/2023  Modified Barium Swallow completed.  Full report located under Chart Review in the Imaging Section.  History of Present Illness Pt is a 72 yo male presenting to ED 7/25 presenting with nausea, vomiting, and fever. Per MD note, pt's wife reports pt does not ambulate and is bedbound. She also reports that he lays flat in bed to eat/drink. CXR revealed central peribronchial wall thickening with some interstitial and patchy opacities in both lower lobes. PMH includes cancer, T2DM, dizziness with frequent falls, DVT, syncope   Clinical Impression Pt presents with an overall functional oropharyngeal swallow. He effectively achieved complete laryngeal vestibule closure during the swallow. Pharyngeal residue present throughout trials of thicker consistencies along base of tongue, valleculae, and pyriform sinuses, although he effectively manages this residue with an intermittent cough, which he initiates independently. No penetration or aspiration noted throughout all trials, although may be at greater risk if deconditioned or if he does not maintain a fully upright position while eating and drinking. The pill was administered with straw sips of thin liquids with prompt and complete clearance during the esophageal sweep. Recommend continuing diet of regular textures with thin liquids only when pt is positioned fully upright. Given recent emesis, recommend staying fully upright ~30 minutes after eating and drinking. SLP will f/u x1 to provide education regarding aspiration precautions. Factors that may increase risk of adverse event in presence of aspiration [employer]ye [employer]aks [employer]ance Coots 2021): Limited mobility;Frail or deconditioned  Swallow Evaluation Recommendations Recommendations: PO diet PO Diet Recommendation: Regular;Thin liquids (Level 0) Liquid Administration via:  Cup;Straw Medication Administration: Whole meds with liquid Supervision: Patient able to self-feed Swallowing strategies  : Minimize environmental distractions;Slow rate Postural changes: Position pt fully upright for meals;Stay upright 30-60 min after meals Oral care recommendations: Oral care BID (2x/day)      Gwynneth Aliment, M.A., CF-SLP Speech Language Pathology, Acute Rehabilitation Services  Secure Chat preferred [phone]  04/12/2023,2:55 PM

## 2023-04-12 NOTE — Progress Notes (Addendum)
[facility]   Reason for visit: Follow up on bacteremia  Interval History: blood cultures with Staph aureus and Providencia. WBC 8.9, Tmax 100.5 Day 3 antibiotics  Physical Exam: Constitutional:  Vitals:   04/12/23 0810 04/12/23 1123  BP: 133/71   Pulse: 93   Resp: 16 15  Temp: 98.8 F (37.1 C)   SpO2: 97%    patient appears in NAD Respiratory: Normal respiratory effort  Review of Systems: Constitutional: negative for fevers and chills  Lab Results  Component Value Date   WBC 8.9 04/11/2023   HGB 12.6 (L) 04/11/2023   HCT 38.3 (L) 04/11/2023   MCV 92.1 04/11/2023   PLT 212 04/11/2023    Lab Results  Component Value Date   CREATININE 0.81 04/12/2023   BUN 29 (H) 04/12/2023   NA 132 (L) 04/12/2023   K 3.4 (L) 04/12/2023   CL 95 (L) 04/12/2023   CO2 25 04/12/2023    Lab Results  Component Value Date   ALT 12 04/11/2023   AST 14 (L) 04/11/2023   ALKPHOS 74 04/11/2023     Microbiology: Recent Results (from the past 240 hour(s))  Blood culture (routine x 2)     Status: Abnormal (Preliminary result)   Collection Time: 04/10/23 10:55 PM   Specimen: BLOOD  Result Value Ref Range Status   Specimen Description BLOOD SITE NOT SPECIFIED  Final   Special Requests   Final    BOTTLES DRAWN AEROBIC AND ANAEROBIC Blood Culture results may not be optimal due to an excessive volume of blood received in culture bottles   Culture  Setup Time   Final    GRAM NEGATIVE RODS GRAM POSITIVE COCCI IN CLUSTERS IN BOTH AEROBIC AND ANAEROBIC BOTTLES CRITICAL RESULT CALLED TO, READ BACK BY AND VERIFIED WITH: E. SINCLAIR PHARMD, AT 1458 04/11/23 D. VANHOOK    Culture (A)  Final    STAPHYLOCOCCUS AUREUS PROVIDENCIA RETTGERI SUSCEPTIBILITIES TO FOLLOW Performed at [facility], [address] [place], [place] [address]    Report Status PENDING  Incomplete  Blood Culture ID Panel (Reflexed)     Status: Abnormal   Collection Time: 04/10/23 10:55  PM  Result Value Ref Range Status   Enterococcus faecalis NOT DETECTED NOT DETECTED Final   Enterococcus Faecium NOT DETECTED NOT DETECTED Final   Listeria monocytogenes NOT DETECTED NOT DETECTED Final   Staphylococcus species DETECTED (A) NOT DETECTED Final    Comment: CRITICAL RESULT CALLED TO, READ BACK BY AND VERIFIED WITH: E. SINCLAIR PHARMD, AT 1458 04/11/23 D. VANHOOK    Staphylococcus aureus (BCID) DETECTED (A) NOT DETECTED Final    Comment: Methicillin (oxacillin)-resistant Staphylococcus aureus (MRSA). MRSA is predictably resistant to beta-lactam antibiotics (except ceftaroline). Preferred therapy is vancomycin unless clinically contraindicated. Patient requires contact precautions if  hospitalized. CRITICAL RESULT CALLED TO, READ BACK BY AND VERIFIED WITH: E. SINCLAIR PHARMD, AT 1458 04/11/23 D. VANHOOK    Staphylococcus epidermidis NOT DETECTED NOT DETECTED Final   Staphylococcus lugdunensis NOT DETECTED NOT DETECTED Final   Streptococcus species NOT DETECTED NOT DETECTED Final   Streptococcus agalactiae NOT DETECTED NOT DETECTED Final   Streptococcus pneumoniae NOT DETECTED NOT DETECTED Final   Streptococcus pyogenes NOT DETECTED NOT DETECTED Final   A.calcoaceticus-baumannii NOT DETECTED NOT DETECTED Final   Bacteroides fragilis NOT DETECTED NOT DETECTED Final   Enterobacterales DETECTED (A) NOT DETECTED Final    Comment: Enterobacterales represent a large order of gram negative bacteria, not a single organism.  Refer to culture for further identification. CRITICAL RESULT CALLED TO, READ BACK BY AND VERIFIED WITH: E. SINCLAIR PHARMD, AT 1458 04/11/23 D. VANHOOK    Enterobacter cloacae complex NOT DETECTED NOT DETECTED Final   Escherichia coli NOT DETECTED NOT DETECTED Final   Klebsiella aerogenes NOT DETECTED NOT DETECTED Final   Klebsiella oxytoca NOT DETECTED NOT DETECTED Final   Klebsiella pneumoniae NOT DETECTED NOT DETECTED Final   Proteus species NOT DETECTED NOT  DETECTED Final   Salmonella species NOT DETECTED NOT DETECTED Final   Serratia marcescens NOT DETECTED NOT DETECTED Final   Haemophilus influenzae NOT DETECTED NOT DETECTED Final   Neisseria meningitidis NOT DETECTED NOT DETECTED Final   Pseudomonas aeruginosa NOT DETECTED NOT DETECTED Final   Stenotrophomonas maltophilia NOT DETECTED NOT DETECTED Final   Candida albicans NOT DETECTED NOT DETECTED Final   Candida auris NOT DETECTED NOT DETECTED Final   Candida glabrata NOT DETECTED NOT DETECTED Final   Candida krusei NOT DETECTED NOT DETECTED Final   Candida parapsilosis NOT DETECTED NOT DETECTED Final   Candida tropicalis NOT DETECTED NOT DETECTED Final   Cryptococcus neoformans/gattii NOT DETECTED NOT DETECTED Final   CTX-M ESBL NOT DETECTED NOT DETECTED Final   Carbapenem resistance IMP NOT DETECTED NOT DETECTED Final   Carbapenem resistance KPC NOT DETECTED NOT DETECTED Final   Meth resistant mecA/C and MREJ DETECTED (A) NOT DETECTED Final    Comment: CRITICAL RESULT CALLED TO, READ BACK BY AND VERIFIED WITH: E. SINCLAIR PHARMD, AT 1458 04/11/23 D. VANHOOK    Carbapenem resistance NDM NOT DETECTED NOT DETECTED Final   Carbapenem resist OXA 48 LIKE NOT DETECTED NOT DETECTED Final   Carbapenem resistance VIM NOT DETECTED NOT DETECTED Final    Comment: Performed at [facility], [address], [place], [place] [address]  Blood culture (routine x 2)     Status: Abnormal (Preliminary result)   Collection Time: 04/10/23 11:09 PM   Specimen: BLOOD  Result Value Ref Range Status   Specimen Description BLOOD SITE NOT SPECIFIED  Final   Special Requests   Final    BOTTLES DRAWN AEROBIC AND ANAEROBIC Blood Culture results may not be optimal due to an excessive volume of blood received in culture bottles   Culture  Setup Time   Final    GRAM POSITIVE COCCI IN CLUSTERS IN BOTH AEROBIC AND ANAEROBIC BOTTLES GRAM NEGATIVE RODS AEROBIC BOTTLE ONLY Performed at [facility],  [address], [place], [place] [address]    Culture STAPHYLOCOCCUS AUREUS PROVIDENCIA RETTGERI  (A)  Final   Report Status PENDING  Incomplete  Resp panel by RT-PCR (RSV, Flu A&B, Covid) Anterior Nasal Swab     Status: None   Collection Time: 04/10/23 11:47 PM   Specimen: Anterior Nasal Swab  Result Value Ref Range Status   SARS Coronavirus 2 by RT PCR NEGATIVE NEGATIVE Final   Influenza A by PCR NEGATIVE NEGATIVE Final   Influenza B by PCR NEGATIVE NEGATIVE Final    Comment: (NOTE) The Xpert Xpress SARS-CoV-2/FLU/RSV plus assay is intended as an aid in the diagnosis of influenza from Nasopharyngeal swab specimens and should not be used as a sole basis for treatment. Nasal washings and aspirates are unacceptable for Xpert Xpress SARS-CoV-2/FLU/RSV testing.  Fact Sheet for Patients: BloggerCourse.com  Fact Sheet for Healthcare Providers: SeriousBroker.it  This test is not yet approved or cleared by the [place] FDA and has been authorized for detection and/or diagnosis of SARS-CoV-2 by FDA under  an Emergency Use Authorization (EUA). This EUA will remain in effect (meaning this test can be used) for the duration of the COVID-19 declaration under Section 564(b)(1) of the Act, 21 U.S.C. section 360bbb-3(b)(1), unless the authorization is terminated or revoked.     Resp Syncytial Virus by PCR NEGATIVE NEGATIVE Final    Comment: (NOTE) Fact Sheet for Patients: BloggerCourse.com  Fact Sheet for Healthcare Providers: SeriousBroker.it  This test is not yet approved or cleared by the [place] FDA and has been authorized for detection and/or diagnosis of SARS-CoV-2 by FDA under an Emergency Use Authorization (EUA). This EUA will remain in effect (meaning this test can be used) for the duration of the COVID-19 declaration under Section 564(b)(1) of the Act, 21 U.S.C. section  360bbb-3(b)(1), unless the authorization is terminated or revoked.  Performed at [facility], [address], [place], [place] [address]   Respiratory (~20 pathogens) panel by PCR     Status: None   Collection Time: 04/11/23  8:20 AM   Specimen: Nasopharyngeal Swab; Respiratory  Result Value Ref Range Status   Adenovirus NOT DETECTED NOT DETECTED Final   Coronavirus 229E NOT DETECTED NOT DETECTED Final    Comment: (NOTE) The Coronavirus on the Respiratory Panel, DOES NOT test for the novel  Coronavirus (2019 nCoV)    Coronavirus HKU1 NOT DETECTED NOT DETECTED Final   Coronavirus NL63 NOT DETECTED NOT DETECTED Final   Coronavirus OC43 NOT DETECTED NOT DETECTED Final   Metapneumovirus NOT DETECTED NOT DETECTED Final   Rhinovirus / Enterovirus NOT DETECTED NOT DETECTED Final   Influenza A NOT DETECTED NOT DETECTED Final   Influenza B NOT DETECTED NOT DETECTED Final   Parainfluenza Virus 1 NOT DETECTED NOT DETECTED Final   Parainfluenza Virus 2 NOT DETECTED NOT DETECTED Final   Parainfluenza Virus 3 NOT DETECTED NOT DETECTED Final   Parainfluenza Virus 4 NOT DETECTED NOT DETECTED Final   Respiratory Syncytial Virus NOT DETECTED NOT DETECTED Final   Bordetella pertussis NOT DETECTED NOT DETECTED Final   Bordetella Parapertussis NOT DETECTED NOT DETECTED Final   Chlamydophila pneumoniae NOT DETECTED NOT DETECTED Final   Mycoplasma pneumoniae NOT DETECTED NOT DETECTED Final    Comment: Performed at [facility], [address] [place], [place] [address]    Impression/Plan:  1. Bacteremia - Staph aureus and Providencia.  Most likely source wound of leg.  Xray ordered for ? Osteomyelitis and will follow and see if orthopedic involvement needed.  Last Hgb A1c very high.   Continue with vancomycin and cefepime pending sensitivities TTE without concerns.  Will need a TEE   2.  DM - very poor control it appears complicating his wound and healing.   Will need to improve his  care  3.  Wound on leg - getting xray.  Possible source of above.    Dr. Renold Don will follow cultures this weekend and available as needed over the weekend and ID team will follow up Monday

## 2023-04-13 DIAGNOSIS — R7881 Bacteremia: Secondary | ICD-10-CM

## 2023-04-13 LAB — GLUCOSE, CAPILLARY
Glucose-Capillary: 135 mg/dL — ABNORMAL HIGH (ref 70–99)
Glucose-Capillary: 156 mg/dL — ABNORMAL HIGH (ref 70–99)
Glucose-Capillary: 183 mg/dL — ABNORMAL HIGH (ref 70–99)
Glucose-Capillary: 229 mg/dL — ABNORMAL HIGH (ref 70–99)

## 2023-04-13 NOTE — Plan of Care (Signed)

## 2023-04-13 NOTE — Progress Notes (Signed)
PROGRESS NOTE    Micheal Moore.  UEA:[id number] DOB: 12-24-1950 DOA: 04/10/2023 PCP: [name], MD    Brief Narrative:   Micheal Wiland. is a 72 y.o. male with medical history significant of CAD s/p PCI, syncope, orthostatic hypotension, diabetes mellitus type 2, DVT of right leg, peripheral artery disease, right elbow melanoma s/p excision who presents with nausea and vomiting.  History is obtained from patient and with the assistance of his wife over the phone.  Patient states that he does not ambulate and is bedbound.  However, over the phone wife makes note that no one ever signed off to say that he cannot walk.  She reports that she takes care of him and cleans him, but does expect him to do certain things for himself such as giving himself meds as he can see.  She reports that he lays in a hospital bed and eat while laying flat when he knows he should be sitting up.  Also makes note he possibly does not always take his medications as he should despite her laying them out for him.  Last night patient developed acute onset of nausea and vomiting, but had been in his normal state of health prior to that as he had been seen by his primary care provider just 2 days ago.  He had not had any cough.  In route with EMS patient was noted to be febrile up to 100.5 F.  Found to have bacteremia- source unclear.    Assessment and Plan: Suspected aspiration pneumonia Acute.  Patient presents with complaints of nausea and vomiting.  Chest x-ray noted central peribronchial wall thickening within some interstitial and patchy opacities in both lower lungs concerning for atypical/viral infection.  Influenza, COVID-19, and RSV screening were negative.  Procalcitonin was elevated at 0.4.  Based off history suspect possible aspiration pneumonia/bacterial pneumonia. -see below re: blood cultures -Incentive spirometry and flutter valve -see below abx -Mucinex   Bacteremia -ID  consult -Vancomycin + cefepime -Repeat blood cultures -TTE- no vegitations  Nausea and vomiting -resolved   Orthostatic hypotension Chronic.  -Continue midodrine and fludrocortisone   Elevated troponin CAD Acute.  High-sensitivity troponin 14->18.  Patient with recent inferior STEMI on 4/30 with stent placement..  Not on beta-blocker due to hypotension. -Continue Brilinta and aspirin   Hypokalemia -repleted   Uncontrolled diabetes mellitus type 2, with long-term use of insulin Last available hemoglobin A1c was 13.3 on  01/15/2023.  Patient reports taking Lantus 30-35 units nightly, NovoLog 8 to 10 units of 3 times daily with meals.   Normocytic anemia -trend   Peripheral vascular disease Patient with prior left abnormal ABI toe brachial index where he had aortogram with bilateral runoff with no intervention needed.  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 -Continue Brilinta and statin   Hyperlipidemia -Continue Crestor   History of DVT Patient previously had been on Eliquis, but was not currently taking and just on aspirin.   Pressure ulcer of the left heel -Wound care consult:  cleanse wounds to left heel and left lower leg with VASHE solution (LAWSON # [id number]) and pat dry. Apply VASHE moist gauze and cover with foam dressing. Change gauze daily and foam every other day.    Debility -PT/OT - no PT follow up     DVT prophylaxis: enoxaparin (LOVENOX) injection 40 mg Start: 04/11/23 1000 SCDs Start: 04/11/23 0133    Code Status: Full Code   Disposition Plan:  Level of care: Telemetry Medical Status is: Inpatient Remains inpatient appropriate     Consultants:  ID   Subjective: No SOB, no CP Wound on left heel  Objective: Vitals:   04/12/23 1552 04/12/23 2139 04/13/23 0527 04/13/23 1003  BP: 128/71 130/76 110/70 117/75  Pulse: 84 85 84 85  Resp: 16 16 16 15   Temp: 98.1 F (36.7 C) 98.8 F (37.1 C) 98.3 F (36.8 C)  98.2 F (36.8 C)  TempSrc:  Oral Oral   SpO2: 97% 95% 97% 97%  Weight:        Intake/Output Summary (Last 24 hours) at 04/13/2023 1200 Last data filed at 04/13/2023 8295 Gross per 24 hour  Intake 831.39 ml  Output 3150 ml  Net -2318.61 ml   Filed Weights   04/10/23 2212 04/12/23 0500  Weight: 94.8 kg 87.3 kg    Examination:   General: Appearance:    Well developed, well nourished male in no acute distress     Lungs:     respirations unlabored  Heart:    Normal heart rate. .    MS:   amputated.    Neurologic:   Awake, alert       Data Reviewed: I have personally reviewed following labs and imaging studies  CBC: Recent Labs  Lab 04/10/23 2255 04/11/23 0445 04/13/23 0323  WBC 9.0 8.9 5.0  NEUTROABS 7.6 7.2  --   HGB 12.7* 12.6* 11.1*  HCT 39.0 38.3* 34.0*  MCV 94.7 92.1 90.9  PLT 272 212 212   Basic Metabolic Panel: Recent Labs  Lab 04/10/23 2255 04/11/23 0236 04/12/23 0254 04/13/23 0323  NA 133* 132* 132* 136  K 3.1* 3.2* 3.4* 3.9  CL 99 97* 95* 105  CO2 21* 21* 25 23  GLUCOSE 119* 140* 141* 156*  BUN 18 20 29* 23  CREATININE 0.72 0.78 0.81 0.67  CALCIUM 8.7* 8.7* 8.8* 8.6*  MG  --  1.9  --   --   PHOS  --  3.8  --   --    GFR: Estimated Creatinine Clearance: 103.1 mL/min (by C-G formula based on SCr of 0.67 mg/dL). Liver Function Tests: Recent Labs  Lab 04/10/23 2255 04/11/23 0236  AST 14* 14*  ALT 12 12  ALKPHOS 76 74  BILITOT 1.4* 1.4*  PROT 7.0 6.7  ALBUMIN 3.3* 3.3*   Recent Labs  Lab 04/10/23 2255  LIPASE 23   No results for input(s): "AMMONIA" in the last 168 hours. Coagulation Profile: No results for input(s): "INR", "PROTIME" in the last 168 hours. Cardiac Enzymes: No results for input(s): "CKTOTAL", "CKMB", "CKMBINDEX", "TROPONINI" in the last 168 hours. BNP (last 3 results) No results for input(s): "PROBNP" in the last 8760 hours. HbA1C: No results for input(s): "HGBA1C" in the last 72 hours. CBG: Recent Labs  Lab  04/12/23 1115 04/12/23 1550 04/12/23 2141 04/13/23 0738 04/13/23 1135  GLUCAP 152* 204* 160* 135* 156*   Lipid Profile: No results for input(s): "CHOL", "HDL", "LDLCALC", "TRIG", "CHOLHDL", "LDLDIRECT" in the last 72 hours. Thyroid Function Tests: No results for input(s): "TSH", "T4TOTAL", "FREET4", "T3FREE", "THYROIDAB" in the last 72 hours. Anemia Panel: No results for input(s): "VITAMINB12", "FOLATE", "FERRITIN", "TIBC", "IRON", "RETICCTPCT" in the last 72 hours. Sepsis Labs: Recent Labs  Lab 04/10/23 2302 04/11/23 0140 04/11/23 0236  PROCALCITON  --   --  0.40  LATICACIDVEN 1.1 1.6  --     Recent Results (from the past 240 hour(s))  Blood culture (routine x 2)  Status: Abnormal (Preliminary result)   Collection Time: 04/10/23 10:55 PM   Specimen: BLOOD  Result Value Ref Range Status   Specimen Description BLOOD SITE NOT SPECIFIED  Final   Special Requests   Final    BOTTLES DRAWN AEROBIC AND ANAEROBIC Blood Culture results may not be optimal due to an excessive volume of blood received in culture bottles   Culture  Setup Time   Final    GRAM NEGATIVE RODS GRAM POSITIVE COCCI IN CLUSTERS IN BOTH AEROBIC AND ANAEROBIC BOTTLES CRITICAL RESULT CALLED TO, READ BACK BY AND VERIFIED WITHNorva Riffle PHARMD, AT 1458 04/11/23 Renato Shin Performed at [facility], [address], [place], [place] [address]    Culture (A)  Final    METHICILLIN RESISTANT STAPHYLOCOCCUS AUREUS PROVIDENCIA RETTGERI    Report Status PENDING  Incomplete   Organism ID, Bacteria METHICILLIN RESISTANT STAPHYLOCOCCUS AUREUS  Final   Organism ID, Bacteria PROVIDENCIA RETTGERI  Final      Susceptibility   Methicillin resistant staphylococcus aureus - MIC*    CIPROFLOXACIN >=8 RESISTANT Resistant     ERYTHROMYCIN >=8 RESISTANT Resistant     GENTAMICIN <=0.5 SENSITIVE Sensitive     OXACILLIN >=4 RESISTANT Resistant     TETRACYCLINE <=1 SENSITIVE Sensitive     VANCOMYCIN <=0.5 SENSITIVE  Sensitive     TRIMETH/SULFA <=10 SENSITIVE Sensitive     CLINDAMYCIN RESISTANT Resistant     RIFAMPIN <=0.5 SENSITIVE Sensitive     Inducible Clindamycin POSITIVE Resistant     LINEZOLID 2 SENSITIVE Sensitive     * METHICILLIN RESISTANT STAPHYLOCOCCUS AUREUS   Providencia rettgeri - MIC*    AMPICILLIN RESISTANT Resistant     CEFEPIME <=0.12 SENSITIVE Sensitive     CEFTAZIDIME <=1 SENSITIVE Sensitive     CEFTRIAXONE <=0.25 SENSITIVE Sensitive     CIPROFLOXACIN <=0.25 SENSITIVE Sensitive     GENTAMICIN <=1 SENSITIVE Sensitive     IMIPENEM 1 SENSITIVE Sensitive     TRIMETH/SULFA <=20 SENSITIVE Sensitive     AMPICILLIN/SULBACTAM <=2 SENSITIVE Sensitive     PIP/TAZO <=4 SENSITIVE Sensitive     * PROVIDENCIA RETTGERI  Blood Culture ID Panel (Reflexed)     Status: Abnormal   Collection Time: 04/10/23 10:55 PM  Result Value Ref Range Status   Enterococcus faecalis NOT DETECTED NOT DETECTED Final   Enterococcus Faecium NOT DETECTED NOT DETECTED Final   Listeria monocytogenes NOT DETECTED NOT DETECTED Final   Staphylococcus species DETECTED (A) NOT DETECTED Final    Comment: CRITICAL RESULT CALLED TO, READ BACK BY AND VERIFIED WITH: E. SINCLAIR PHARMD, AT 1458 04/11/23 D. VANHOOK    Staphylococcus aureus (BCID) DETECTED (A) NOT DETECTED Final    Comment: Methicillin (oxacillin)-resistant Staphylococcus aureus (MRSA). MRSA is predictably resistant to beta-lactam antibiotics (except ceftaroline). Preferred therapy is vancomycin unless clinically contraindicated. Patient requires contact precautions if  hospitalized. CRITICAL RESULT CALLED TO, READ BACK BY AND VERIFIED WITH: E. SINCLAIR PHARMD, AT 1458 04/11/23 D. VANHOOK    Staphylococcus epidermidis NOT DETECTED NOT DETECTED Final   Staphylococcus lugdunensis NOT DETECTED NOT DETECTED Final   Streptococcus species NOT DETECTED NOT DETECTED Final   Streptococcus agalactiae NOT DETECTED NOT DETECTED Final   Streptococcus pneumoniae NOT  DETECTED NOT DETECTED Final   Streptococcus pyogenes NOT DETECTED NOT DETECTED Final   A.calcoaceticus-baumannii NOT DETECTED NOT DETECTED Final   Bacteroides fragilis NOT DETECTED NOT DETECTED Final   Enterobacterales DETECTED (A) NOT DETECTED Final    Comment: Enterobacterales represent a large order  of gram negative bacteria, not a single organism. Refer to culture for further identification. CRITICAL RESULT CALLED TO, READ BACK BY AND VERIFIED WITH: E. SINCLAIR PHARMD, AT 1458 04/11/23 D. VANHOOK    Enterobacter cloacae complex NOT DETECTED NOT DETECTED Final   Escherichia coli NOT DETECTED NOT DETECTED Final   Klebsiella aerogenes NOT DETECTED NOT DETECTED Final   Klebsiella oxytoca NOT DETECTED NOT DETECTED Final   Klebsiella pneumoniae NOT DETECTED NOT DETECTED Final   Proteus species NOT DETECTED NOT DETECTED Final   Salmonella species NOT DETECTED NOT DETECTED Final   Serratia marcescens NOT DETECTED NOT DETECTED Final   Haemophilus influenzae NOT DETECTED NOT DETECTED Final   Neisseria meningitidis NOT DETECTED NOT DETECTED Final   Pseudomonas aeruginosa NOT DETECTED NOT DETECTED Final   Stenotrophomonas maltophilia NOT DETECTED NOT DETECTED Final   Candida albicans NOT DETECTED NOT DETECTED Final   Candida auris NOT DETECTED NOT DETECTED Final   Candida glabrata NOT DETECTED NOT DETECTED Final   Candida krusei NOT DETECTED NOT DETECTED Final   Candida parapsilosis NOT DETECTED NOT DETECTED Final   Candida tropicalis NOT DETECTED NOT DETECTED Final   Cryptococcus neoformans/gattii NOT DETECTED NOT DETECTED Final   CTX-M ESBL NOT DETECTED NOT DETECTED Final   Carbapenem resistance IMP NOT DETECTED NOT DETECTED Final   Carbapenem resistance KPC NOT DETECTED NOT DETECTED Final   Meth resistant mecA/C and MREJ DETECTED (A) NOT DETECTED Final    Comment: CRITICAL RESULT CALLED TO, READ BACK BY AND VERIFIED WITH: E. SINCLAIR PHARMD, AT 1458 04/11/23 D. VANHOOK    Carbapenem  resistance NDM NOT DETECTED NOT DETECTED Final   Carbapenem resist OXA 48 LIKE NOT DETECTED NOT DETECTED Final   Carbapenem resistance VIM NOT DETECTED NOT DETECTED Final    Comment: Performed at [facility], [address], [place], [place] [address]  Blood culture (routine x 2)     Status: Abnormal (Preliminary result)   Collection Time: 04/10/23 11:09 PM   Specimen: BLOOD  Result Value Ref Range Status   Specimen Description BLOOD SITE NOT SPECIFIED  Final   Special Requests   Final    BOTTLES DRAWN AEROBIC AND ANAEROBIC Blood Culture results may not be optimal due to an excessive volume of blood received in culture bottles   Culture  Setup Time   Final    GRAM POSITIVE COCCI IN CLUSTERS IN BOTH AEROBIC AND ANAEROBIC BOTTLES GRAM NEGATIVE RODS AEROBIC BOTTLE ONLY    Culture (A)  Final    STAPHYLOCOCCUS AUREUS PROVIDENCIA RETTGERI SUSCEPTIBILITIES PERFORMED ON PREVIOUS CULTURE WITHIN THE LAST 5 DAYS. Performed at [facility], [address] [place], [place] [address]    Report Status PENDING  Incomplete  Resp panel by RT-PCR (RSV, Flu A&B, Covid) Anterior Nasal Swab     Status: None   Collection Time: 04/10/23 11:47 PM   Specimen: Anterior Nasal Swab  Result Value Ref Range Status   SARS Coronavirus 2 by RT PCR NEGATIVE NEGATIVE Final   Influenza A by PCR NEGATIVE NEGATIVE Final   Influenza B by PCR NEGATIVE NEGATIVE Final    Comment: (NOTE) The Xpert Xpress SARS-CoV-2/FLU/RSV plus assay is intended as an aid in the diagnosis of influenza from Nasopharyngeal swab specimens and should not be used as a sole basis for treatment. Nasal washings and aspirates are unacceptable for Xpert Xpress SARS-CoV-2/FLU/RSV testing.  Fact Sheet for Patients: BloggerCourse.com  Fact Sheet for Healthcare Providers: SeriousBroker.it  This test is not yet approved  or cleared by the [place] and has been authorized for  detection and/or diagnosis of SARS-CoV-2 by FDA under an Emergency Use Authorization (EUA). This EUA will remain in effect (meaning this test can be used) for the duration of the COVID-19 declaration under Section 564(b)(1) of the Act, 21 U.S.C. section 360bbb-3(b)(1), unless the authorization is terminated or revoked.     Resp Syncytial Virus by PCR NEGATIVE NEGATIVE Final    Comment: (NOTE) Fact Sheet for Patients: BloggerCourse.com  Fact Sheet for Healthcare Providers: SeriousBroker.it  This test is not yet approved or cleared by the [place] FDA and has been authorized for detection and/or diagnosis of SARS-CoV-2 by FDA under an Emergency Use Authorization (EUA). This EUA will remain in effect (meaning this test can be used) for the duration of the COVID-19 declaration under Section 564(b)(1) of the Act, 21 U.S.C. section 360bbb-3(b)(1), unless the authorization is terminated or revoked.  Performed at [facility], [address], [place], [place] [address]   Respiratory (~20 pathogens) panel by PCR     Status: None   Collection Time: 04/11/23  8:20 AM   Specimen: Nasopharyngeal Swab; Respiratory  Result Value Ref Range Status   Adenovirus NOT DETECTED NOT DETECTED Final   Coronavirus 229E NOT DETECTED NOT DETECTED Final    Comment: (NOTE) The Coronavirus on the Respiratory Panel, DOES NOT test for the novel  Coronavirus (2019 nCoV)    Coronavirus HKU1 NOT DETECTED NOT DETECTED Final   Coronavirus NL63 NOT DETECTED NOT DETECTED Final   Coronavirus OC43 NOT DETECTED NOT DETECTED Final   Metapneumovirus NOT DETECTED NOT DETECTED Final   Rhinovirus / Enterovirus NOT DETECTED NOT DETECTED Final   Influenza A NOT DETECTED NOT DETECTED Final   Influenza B NOT DETECTED NOT DETECTED Final   Parainfluenza Virus 1 NOT DETECTED NOT DETECTED Final   Parainfluenza Virus 2 NOT DETECTED NOT DETECTED Final   Parainfluenza  Virus 3 NOT DETECTED NOT DETECTED Final   Parainfluenza Virus 4 NOT DETECTED NOT DETECTED Final   Respiratory Syncytial Virus NOT DETECTED NOT DETECTED Final   Bordetella pertussis NOT DETECTED NOT DETECTED Final   Bordetella Parapertussis NOT DETECTED NOT DETECTED Final   Chlamydophila pneumoniae NOT DETECTED NOT DETECTED Final   Mycoplasma pneumoniae NOT DETECTED NOT DETECTED Final    Comment: Performed at [facility], [address], [place], [place] [address]  Culture, blood (Routine X 2) w Reflex to ID Panel     Status: None (Preliminary result)   Collection Time: 04/12/23  2:54 AM   Specimen: BLOOD RIGHT HAND  Result Value Ref Range Status   Specimen Description BLOOD RIGHT HAND  Final   Special Requests   Final    BOTTLES DRAWN AEROBIC ONLY Blood Culture adequate volume   Culture   Final    NO GROWTH 1 DAY Performed at [facility], [address] [place], [place] [address]    Report Status PENDING  Incomplete  Culture, blood (Routine X 2) w Reflex to ID Panel     Status: None (Preliminary result)   Collection Time: 04/12/23  2:55 AM   Specimen: BLOOD RIGHT ARM  Result Value Ref Range Status   Specimen Description BLOOD RIGHT ARM  Final   Special Requests   Final    BOTTLES DRAWN AEROBIC ONLY Blood Culture adequate volume   Culture   Final    NO GROWTH 1 DAY Performed at [facility], [address] [place], [place] [address]    Report Status PENDING  Incomplete         Radiology Studies: DG Foot Complete Left  Result Date: 04/12/2023 CLINICAL DATA:  Osteomyelitis.  Pain EXAM: LEFT FOOT - COMPLETE 3 VIEW COMPARISON:  None Available. FINDINGS: Moderate well corticated plantar and Achilles calcaneal spurs. Degenerative changes seen about the dorsal aspect of the midfoot. Degenerative changes also seen of the tibiotalar joint. There is severe osteopenia. Scattered soft tissue swelling. 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 No  clear areas of bony erosive changes at this time. If there is further concern of bone infection, MRI or bone scan may be useful for further sensitivity. IMPRESSION: Osteopenia with soft tissue swelling and degenerative changes. Calcaneal spurs. 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   On: 04/12/2023 15:47   DG Swallowing Func-Speech Pathology  Result Date: 04/12/2023 Table formatting from the original result was not included. Modified Barium Swallow Study Patient Details Name: Micheal Pang. MRN: [id number] Date of Birth: [birth year] Today's Date: 04/12/2023 HPI/PMH: HPI: Pt is a 72 yo male presenting to ED 7/25 presenting with nausea, vomiting, and fever. Per MD note, pt's wife reports pt does not ambulate and is bedbound. She also reports that he lays flat in bed to eat/drink. CXR revealed central peribronchial wall thickening with some interstitial and patchy opacities in both lower lobes. PMH includes cancer, T2DM, dizziness with frequent falls, DVT, syncope Clinical Impression: Clinical Impression: Pt presents with an overall functional oropharyngeal swallow. He effectively achieved complete laryngeal vestibule closure during the swallow. Pharyngeal residue present throughout trials of thicker consistencies along base of tongue, valleculae, and pyriform sinuses. He effectively manages this residue and it is cleared with an intermittent cough, which he initiates independently. No penetration or aspiration noted throughout all trials, although may be at greater risk if deconditioned or if he does not maintain a fully upright position while eating and drinking. The pill was administered with straw sips of thin liquids with prompt and complete clearance during the esophageal sweep. Recommend continuing diet of regular textures with thin liquids only when pt is positioned fully upright. Given recent emesis, recommend staying fully upright ~30  minutes after eating and drinking. SLP will f/u x1 to provide education regarding aspiration precautions. Factors that may increase risk of adverse event in presence of aspiration Rubye Oaks & Clearance Coots 2021): Factors that may increase risk of adverse event in presence of aspiration Rubye Oaks & Clearance Coots 2021): Limited mobility; Frail or deconditioned Recommendations/Plan: Swallowing Evaluation Recommendations Swallowing Evaluation Recommendations Recommendations: PO diet PO Diet Recommendation: Regular; Thin liquids (Level 0) Liquid Administration via: Cup; Straw Medication Administration: Whole meds with liquid Supervision: Patient able to self-feed Swallowing strategies  : Minimize environmental distractions; Slow rate Postural changes: Position pt fully upright for meals; Stay upright 30-60 min after meals Oral care recommendations: Oral care BID (2x/day) Treatment Plan Treatment Plan Treatment recommendations: Therapy as outlined in treatment plan below Follow-up recommendations: No SLP follow up Functional status assessment: Patient has not had a recent decline in their functional status. Treatment frequency: Min 1x/week Treatment duration: 1 week Interventions: Aspiration precaution training; Patient/family education; Diet toleration management by SLP Recommendations Recommendations for follow up therapy are one component of a multi-disciplinary discharge planning process, led by the attending physician.  Recommendations may be updated based on patient status, additional functional criteria and insurance authorization. Assessment: Orofacial Exam: Orofacial  Exam Oral Cavity: Oral Hygiene: WFL Oral Cavity - Dentition: Poor condition; Missing dentition Orofacial Anatomy: WFL Oral Motor/Sensory Function: WFL Anatomy: Anatomy: Suspected cervical osteophytes Boluses Administered: Boluses Administered Boluses Administered: Thin liquids (Level 0); Mildly thick liquids (Level 2, nectar thick); Moderately thick liquids (Level  3, honey thick); Puree; Solid  Oral Impairment Domain: Oral Impairment Domain Lip Closure: No labial escape Tongue control during bolus hold: Cohesive bolus between tongue to palatal seal Bolus preparation/mastication: Timely and efficient chewing and mashing Bolus transport/lingual motion: Brisk tongue motion Oral residue: Complete oral clearance Location of oral residue : N/A Initiation of pharyngeal swallow : Valleculae  Pharyngeal Impairment Domain: Pharyngeal Impairment Domain Soft palate elevation: No bolus between soft palate (SP)/pharyngeal wall (PW) Laryngeal elevation: Complete superior movement of thyroid cartilage with complete approximation of arytenoids to epiglottic petiole Anterior hyoid excursion: Complete anterior movement Epiglottic movement: Complete inversion Laryngeal vestibule closure: Complete, no air/contrast in laryngeal vestibule Pharyngeal stripping wave : Present - complete Pharyngeal contraction (A/P view only): N/A Pharyngoesophageal segment opening: Complete distension and complete duration, no obstruction of flow Tongue base retraction: Trace column of contrast or air between tongue base and PPW Pharyngeal residue: Collection of residue within or on pharyngeal structures Location of pharyngeal residue: Valleculae; Tongue base; Pyriform sinuses  Esophageal Impairment Domain: Esophageal Impairment Domain Esophageal clearance upright position: Complete clearance, esophageal coating Pill: Pill Consistency administered: Thin liquids (Level 0) Thin liquids (Level 0): [facility] Penetration/Aspiration Scale Score: Penetration/Aspiration Scale Score 1.  Material does not enter airway: Thin liquids (Level 0); Mildly thick liquids (Level 2, nectar thick); Moderately thick liquids (Level 3, honey thick); Puree; Solid; Pill Compensatory Strategies: Compensatory Strategies Compensatory strategies: Yes Straw: Effective Effective Straw: Thin liquid (Level 0)   General Information: Caregiver present: No   Diet Prior to this Study: Regular; Thin liquids (Level 0)   Temperature : Normal   Respiratory Status: WFL   Supplemental O2: None (Room air)   History of Recent Intubation: No  Behavior/Cognition: Alert; Cooperative; Pleasant mood Self-Feeding Abilities: Able to self-feed Baseline vocal quality/speech: Normal Volitional Cough: Able to elicit Volitional Swallow: Able to elicit Exam Limitations: No limitations Goal Planning: Prognosis for improved oropharyngeal function: Good Barriers to Reach Goals: Time post onset No data recorded Patient/Family Stated Goal: none stated Consulted and agree with results and recommendations: Patient Pain: Pain Assessment Pain Assessment: Faces Faces Pain Scale: 6 Pain Location: feet/toes when bumped/touched Pain Descriptors / Indicators: Guarding; Grimacing; Discomfort Pain Intervention(s): Monitored during session; Repositioned End of Session: Start Time:SLP Start Time (ACUTE ONLY): 1352 Stop Time: SLP Stop Time (ACUTE ONLY): 1411 Time Calculation:SLP Time Calculation (min) (ACUTE ONLY): 19 min Charges: SLP Evaluations $ SLP Speech Visit: 1 Visit SLP Evaluations $BSS Swallow: 1 Procedure $MBS Swallow: 1 Procedure SLP visit diagnosis: SLP Visit Diagnosis: Dysphagia, pharyngeal phase (R13.13) Past Medical History: Past Medical History: Diagnosis Date  Cancer (HCC)   right elbow melanoma  Diabetes mellitus without complication (HCC)   Type II  Dizziness   DVT (deep venous thrombosis) (HCC)   right leg  Frequent falls   Near syncope   Syncope  Past Surgical History: Past Surgical History: Procedure Laterality Date  AMPUTATION TOE Left 04/28/2020  Procedure: AMPUTATION LEFT GREAT TOE;  Surgeon: Nadara Mustard, MD;  Location: [facility];  Service: Orthopedics;  Laterality: Left;  CORONARY/GRAFT ACUTE MI REVASCULARIZATION N/A 01/15/2023  Procedure: Coronary/Graft Acute MI Revascularization;  Surgeon: Orbie Pyo, MD;  Location: [facility];  Service: Cardiovascular;  Laterality: N/A;  INTRAMEDULLARY (IM) NAIL INTERTROCHANTERIC Right 02/23/2022  Procedure: INTRAMEDULLARY (IM) NAIL INTERTROCHANTRIC, RIGHT;  Surgeon: Samson Frederic, MD;  Location: [facility];  Service: Orthopedics;  Laterality: Right;  LEFT HEART CATH AND CORONARY ANGIOGRAPHY N/A 01/15/2023  Procedure: LEFT HEART CATH AND CORONARY ANGIOGRAPHY;  Surgeon: Orbie Pyo, MD;  Location: [facility];  Service: Cardiovascular;  Laterality: N/A;  LOWER EXTREMITY ANGIOGRAPHY Bilateral 01/18/2023  Procedure: Lower Extremity Angiography;  Surgeon: Nada Libman, MD;  Location: [facility];  Service: Cardiovascular;  Laterality: Bilateral;  MELANOMA EXCISION WITH SENTINEL LYMPH NODE BIOPSY Right 08/19/2020  Procedure: WIDE LOCAL EXCISION RIGHT ELBOW MELANOMA WITH RIGHT AXILLARY SENTINEL LYMPH NODE BIOPSY;  Surgeon: Fritzi Mandes, MD;  Location: [facility];  Service: General;  Laterality: Right;  2nd incision in axilla  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  Procedure: TOTAL HIP ARTHROPLASTY ANTERIOR APPROACH;  Surgeon: Samson Frederic, MD;  Location: [facility];  Service: Orthopedics;  Laterality: Left;  TOTAL HIP ARTHROPLASTY Left 08/24/2022  Procedure: OPEN REDUCTION, HEAD AND LINER EXCHANGE;  Surgeon: Samson Frederic, MD;  Location: [facility];  Service: Orthopedics;  Laterality: Left; Gwynneth Aliment, M.A., CF-SLP Speech Language Pathology, Acute Rehabilitation Services Secure Chat preferred [phone] 04/12/2023, 2:57 PM   ECHOCARDIOGRAM LIMITED  Result Date: 04/12/2023    ECHOCARDIOGRAM LIMITED REPORT   Patient Name:   Micheal Gallet. Date of Exam: 04/12/2023 Medical Rec #:  [id number]                 Height:       77.0 in Accession #:    [id number]                Weight:       192.5 lb Date of Birth:  1950/12/29                 BSA:          2.201 m Patient Age:    72 years                  BP:           129/74 mmHg Patient Gender: M                         HR:           76 bpm. Exam Location:  Inpatient Procedure: Limited Echo  and Color Doppler Indications:    Bacteremia  History:        Patient has prior history of Echocardiogram examinations, most                 recent 01/16/2023. CAD and Previous Myocardial Infarction; Risk                 Factors:Dyslipidemia and Diabetes.  Sonographer:    Milbert Coulter Referring Phys: Belia Heman COMER IMPRESSIONS  1. No obvious vegetations, but valves not seen well enough to rule out. Would recommended TEE if clinically indicated.  2. Left ventricular ejection fraction, by estimation, is 65 to 70%. The left ventricle has normal function.  3. Right ventricular systolic function is normal. The right ventricular size is normal.  4. Trivial mitral valve regurgitation.  5. Aortic valve regurgitation is not visualized.  6. The inferior vena cava is normal in size with greater than 50% respiratory variability, suggesting right atrial pressure of 3 mmHg. FINDINGS  Left Ventricle: Left ventricular ejection fraction, by estimation, is 65  to 70%. The left ventricle has normal function. The left ventricular internal cavity size was normal in size. There is no left ventricular hypertrophy. Right Ventricle: The right ventricular size is normal. Right vetricular wall thickness was not assessed. Right ventricular systolic function is normal. Left Atrium: Left atrial size was normal in size. Right Atrium: Right atrial size was normal in size. Pericardium: There is no evidence of pericardial effusion. Mitral Valve: There is mild thickening of the mitral valve leaflet(s). Trivial mitral valve regurgitation. Tricuspid Valve: The tricuspid valve is normal in structure. Tricuspid valve regurgitation is not demonstrated. Aortic Valve: Aortic valve regurgitation is not visualized. Pulmonic Valve: The pulmonic valve was not well visualized. Pulmonic valve regurgitation is not visualized. Aorta: The aortic root is normal in size and structure. Venous: The inferior vena cava is normal in size with greater than 50% respiratory  variability, suggesting right atrial pressure of 3 mmHg. IAS/Shunts: No atrial level shunt detected by color flow Doppler. Additional Comments: Color Doppler performed.  Dietrich Pates MD Electronically signed by Dietrich Pates MD Signature Date/Time: 04/12/2023/2:49:29 PM    Final         Scheduled Meds:  aspirin EC  81 mg Oral Daily   empagliflozin  10 mg Oral Daily   enoxaparin (LOVENOX) injection  40 mg Subcutaneous Q24H   feeding supplement (GLUCERNA SHAKE)  237 mL Oral TID BM   fludrocortisone  0.2 mg Oral Daily   guaiFENesin  600 mg Oral BID   insulin aspart  0-9 Units Subcutaneous TID WC   insulin aspart  5 Units Subcutaneous TID WC   insulin glargine-yfgn  20 Units Subcutaneous QHS   midodrine  7.5 mg Oral TID WC   pantoprazole  40 mg Oral Daily   rosuvastatin  5 mg Oral Once per day on Monday Thursday   ticagrelor  90 mg Oral BID   Continuous Infusions:  ceFEPime (MAXIPIME) IV 2 g (04/13/23 0820)   vancomycin 166.7 mL/hr at 04/13/23 0624     LOS: 2 days    Time spent: 4 minutes spent on chart review, discussion with nursing staff, consultants, updating family and interview/physical exam; more than 50% of that time was spent in counseling and/or coordination of care.    Joseph Art, DO [facility] Available via Epic secure chat 7am-7pm After these hours, please refer to coverage provider listed on [email or web address] 04/13/2023, 12:00 PM

## 2023-04-13 NOTE — Plan of Care (Signed)
?  Problem: Education: ?Goal: Knowledge of General Education information will improve ?Description: Including pain rating scale, medication(s)/side effects and non-pharmacologic comfort measures ?Outcome: Progressing ?  ?Problem: Clinical Measurements: ?Goal: Ability to maintain clinical measurements within normal limits will improve ?Outcome: Progressing ?Goal: Diagnostic test results will improve ?Outcome: Progressing ?  ?Problem: Nutrition: ?Goal: Adequate nutrition will be maintained ?Outcome: Progressing ?  ?Problem: Coping: ?Goal: Level of anxiety will decrease ?Outcome: Progressing ?  ?

## 2023-04-14 DIAGNOSIS — R7881 Bacteremia: Secondary | ICD-10-CM | POA: Diagnosis not present

## 2023-04-14 LAB — GLUCOSE, CAPILLARY
Glucose-Capillary: 200 mg/dL — ABNORMAL HIGH (ref 70–99)
Glucose-Capillary: 172 mg/dL — ABNORMAL HIGH (ref 70–99)
Glucose-Capillary: 114 mg/dL — ABNORMAL HIGH (ref 70–99)
Glucose-Capillary: 97 mg/dL (ref 70–99)

## 2023-04-14 MED ORDER — INSULIN ASPART 100 UNIT/ML IJ SOLN
7.0000 [IU] | Freq: Three times a day (TID) | INTRAMUSCULAR | Status: DC
  Administered 2023-04-14 – 2023-04-18 (×13): 7 [IU] via SUBCUTANEOUS

## 2023-04-14 MED ORDER — INSULIN GLARGINE-YFGN 100 UNIT/ML ~~LOC~~ SOLN
25.0000 [IU] | Freq: Every day | SUBCUTANEOUS | Status: DC
  Administered 2023-04-14 – 2023-04-17 (×4): 25 [IU] via SUBCUTANEOUS
  Filled 2023-04-14 (×5): qty 0.25

## 2023-04-14 NOTE — Progress Notes (Signed)
PROGRESS NOTE    Micheal Moore.  UJW:[id number] DOB: 18-Aug-1951 DOA: 04/10/2023 PCP: [name], MD    Brief Narrative:   Micheal Albanez. is a 72 y.o. male with medical history significant of CAD s/p PCI, syncope, orthostatic hypotension, diabetes mellitus type 2, DVT of right leg, peripheral artery disease, right elbow melanoma s/p excision who presents with nausea and vomiting.  History is obtained from patient and with the assistance of his wife over the phone.  Patient states that he does not ambulate and is bedbound.  However, over the phone wife makes note that no one ever signed off to say that he cannot walk.  She reports that she takes care of him and cleans him, but does expect him to do certain things for himself such as giving himself meds as he can see.  She reports that he lays in a hospital bed and eat while laying flat when he knows he should be sitting up.  Also makes note he possibly does not always take his medications as he should despite her laying them out for him.  Last night patient developed acute onset of nausea and vomiting, but had been in his normal state of health prior to that as he had been seen by his primary care provider just 2 days ago.  He had not had any cough.  In route with EMS patient was noted to be febrile up to 100.5 F.  Found to have bacteremia- source unclear.    Assessment and Plan: Suspected aspiration pneumonia Acute.  Patient presents with complaints of nausea and vomiting.  Chest x-ray noted central peribronchial wall thickening within some interstitial and patchy opacities in both lower lungs concerning for atypical/viral infection.  Influenza, COVID-19, and RSV screening were negative.  Procalcitonin was elevated at 0.4.  Based off history suspect possible aspiration pneumonia/bacterial pneumonia. -see below re: blood cultures -Incentive spirometry and flutter valve -see below abx -Mucinex   Bacteremia -ID  consult- -Vancomycin + cefepime -Repeat blood cultures -TTE- no vegetations ? Wounds vs use of insulin needles over and over  Nausea and vomiting -resolved   Orthostatic hypotension Chronic.  -Continue midodrine and fludrocortisone   Elevated troponin CAD Acute.  High-sensitivity troponin 14->18.  Patient with recent inferior STEMI on 4/30 with stent placement..  Not on beta-blocker due to hypotension. -Continue Brilinta and aspirin   Hypokalemia -repleted   Uncontrolled diabetes mellitus type 2, with long-term use of insulin Last available hemoglobin A1c was 13.3 on  01/15/2023.  Patient reports taking Lantus 30-35 units nightly, NovoLog 8 to 10 units of 3 times daily with meals. -patient reports using pen needles over and over   Normocytic anemia -trend   Peripheral vascular disease Patient with prior left abnormal ABI toe brachial index where he had aortogram with bilateral runoff with no intervention needed.  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 -Continue Brilinta and statin   Hyperlipidemia -Continue Crestor   History of DVT Patient previously had been on Eliquis, but was not currently taking and just on aspirin.   Pressure ulcer of the left heel -Wound care consult:  cleanse wounds to left heel and left lower leg with VASHE solution (LAWSON # [id number]) and pat dry. Apply VASHE moist gauze and cover with foam dressing. Change gauze daily and foam every other day.    Debility -PT/OT - no PT follow up Plan is home with wife once abx plan in place  DVT prophylaxis: enoxaparin (LOVENOX) injection 40 mg Start: 04/11/23 1000 SCDs Start: 04/11/23 0133    Code Status: Full Code   Disposition Plan:  Level of care: Telemetry Medical Status is: Inpatient Remains inpatient appropriate     Consultants:  ID   Subjective: Reports using insulin pen needles over and over  Objective: Vitals:   04/13/23 2135 04/14/23 0430 04/14/23  0500 04/14/23 0828  BP: 120/74 134/75  138/79  Pulse: 84 91  88  Resp: 16 16  18   Temp: 98.4 F (36.9 C) 98.3 F (36.8 C)  98.5 F (36.9 C)  TempSrc: Oral Oral    SpO2: 96% 98%  97%  Weight:   88.8 kg     Intake/Output Summary (Last 24 hours) at 04/14/2023 1358 Last data filed at 04/14/2023 1215 Gross per 24 hour  Intake 1490.3 ml  Output 5175 ml  Net -3684.7 ml   Filed Weights   04/10/23 2212 04/12/23 0500 04/14/23 0500  Weight: 94.8 kg 87.3 kg 88.8 kg    Examination:   General: Appearance:    Well developed, well nourished male in no acute distress     Lungs:     respirations unlabored  Heart:    Normal heart rate. .    MS:   Amputations on foot noted   Neurologic:   Awake, alert       Data Reviewed: I have personally reviewed following labs and imaging studies  CBC: Recent Labs  Lab 04/10/23 2255 04/11/23 0445 04/13/23 0323  WBC 9.0 8.9 5.0  NEUTROABS 7.6 7.2  --   HGB 12.7* 12.6* 11.1*  HCT 39.0 38.3* 34.0*  MCV 94.7 92.1 90.9  PLT 272 212 212   Basic Metabolic Panel: Recent Labs  Lab 04/10/23 2255 04/11/23 0236 04/12/23 0254 04/13/23 0323  NA 133* 132* 132* 136  K 3.1* 3.2* 3.4* 3.9  CL 99 97* 95* 105  CO2 21* 21* 25 23  GLUCOSE 119* 140* 141* 156*  BUN 18 20 29* 23  CREATININE 0.72 0.78 0.81 0.67  CALCIUM 8.7* 8.7* 8.8* 8.6*  MG  --  1.9  --   --   PHOS  --  3.8  --   --    GFR: Estimated Creatinine Clearance: 104.8 mL/min (by C-G formula based on SCr of 0.67 mg/dL). Liver Function Tests: Recent Labs  Lab 04/10/23 2255 04/11/23 0236  AST 14* 14*  ALT 12 12  ALKPHOS 76 74  BILITOT 1.4* 1.4*  PROT 7.0 6.7  ALBUMIN 3.3* 3.3*   Recent Labs  Lab 04/10/23 2255  LIPASE 23   No results for input(s): "AMMONIA" in the last 168 hours. Coagulation Profile: No results for input(s): "INR", "PROTIME" in the last 168 hours. Cardiac Enzymes: No results for input(s): "CKTOTAL", "CKMB", "CKMBINDEX", "TROPONINI" in the last 168  hours. BNP (last 3 results) No results for input(s): "PROBNP" in the last 8760 hours. HbA1C: No results for input(s): "HGBA1C" in the last 72 hours. CBG: Recent Labs  Lab 04/13/23 1135 04/13/23 1717 04/13/23 2137 04/14/23 0718 04/14/23 1110  GLUCAP 156* 183* 229* 200* 172*   Lipid Profile: No results for input(s): "CHOL", "HDL", "LDLCALC", "TRIG", "CHOLHDL", "LDLDIRECT" in the last 72 hours. Thyroid Function Tests: No results for input(s): "TSH", "T4TOTAL", "FREET4", "T3FREE", "THYROIDAB" in the last 72 hours. Anemia Panel: No results for input(s): "VITAMINB12", "FOLATE", "FERRITIN", "TIBC", "IRON", "RETICCTPCT" in the last 72 hours. Sepsis Labs: Recent Labs  Lab 04/10/23 2302 04/11/23 0140 04/11/23 0236  PROCALCITON  --   --  0.40  LATICACIDVEN 1.1 1.6  --     Recent Results (from the past 240 hour(s))  Blood culture (routine x 2)     Status: Abnormal   Collection Time: 04/10/23 10:55 PM   Specimen: BLOOD  Result Value Ref Range Status   Specimen Description BLOOD SITE NOT SPECIFIED  Final   Special Requests   Final    BOTTLES DRAWN AEROBIC AND ANAEROBIC Blood Culture results may not be optimal due to an excessive volume of blood received in culture bottles   Culture  Setup Time   Final    GRAM NEGATIVE RODS GRAM POSITIVE COCCI IN CLUSTERS IN BOTH AEROBIC AND ANAEROBIC BOTTLES CRITICAL RESULT CALLED TO, READ BACK BY AND VERIFIED WITHNorva Riffle PHARMD, AT 1458 04/11/23 Renato Shin Performed at [facility], [address], [place], [place] [address]    Culture (A)  Final    METHICILLIN RESISTANT STAPHYLOCOCCUS AUREUS PROVIDENCIA RETTGERI    Report Status 04/13/2023 FINAL  Final   Organism ID, Bacteria METHICILLIN RESISTANT STAPHYLOCOCCUS AUREUS  Final   Organism ID, Bacteria PROVIDENCIA RETTGERI  Final      Susceptibility   Methicillin resistant staphylococcus aureus - MIC*    CIPROFLOXACIN >=8 RESISTANT Resistant     ERYTHROMYCIN >=8 RESISTANT Resistant      GENTAMICIN <=0.5 SENSITIVE Sensitive     OXACILLIN >=4 RESISTANT Resistant     TETRACYCLINE <=1 SENSITIVE Sensitive     VANCOMYCIN <=0.5 SENSITIVE Sensitive     TRIMETH/SULFA <=10 SENSITIVE Sensitive     CLINDAMYCIN RESISTANT Resistant     RIFAMPIN <=0.5 SENSITIVE Sensitive     Inducible Clindamycin POSITIVE Resistant     LINEZOLID 2 SENSITIVE Sensitive     * METHICILLIN RESISTANT STAPHYLOCOCCUS AUREUS   Providencia rettgeri - MIC*    AMPICILLIN RESISTANT Resistant     CEFEPIME <=0.12 SENSITIVE Sensitive     CEFTAZIDIME <=1 SENSITIVE Sensitive     CEFTRIAXONE <=0.25 SENSITIVE Sensitive     CIPROFLOXACIN <=0.25 SENSITIVE Sensitive     GENTAMICIN <=1 SENSITIVE Sensitive     IMIPENEM 1 SENSITIVE Sensitive     TRIMETH/SULFA <=20 SENSITIVE Sensitive     AMPICILLIN/SULBACTAM <=2 SENSITIVE Sensitive     PIP/TAZO <=4 SENSITIVE Sensitive     * PROVIDENCIA RETTGERI  Blood Culture ID Panel (Reflexed)     Status: Abnormal   Collection Time: 04/10/23 10:55 PM  Result Value Ref Range Status   Enterococcus faecalis NOT DETECTED NOT DETECTED Final   Enterococcus Faecium NOT DETECTED NOT DETECTED Final   Listeria monocytogenes NOT DETECTED NOT DETECTED Final   Staphylococcus species DETECTED (A) NOT DETECTED Final    Comment: CRITICAL RESULT CALLED TO, READ BACK BY AND VERIFIED WITH: E. SINCLAIR PHARMD, AT 1458 04/11/23 D. VANHOOK    Staphylococcus aureus (BCID) DETECTED (A) NOT DETECTED Final    Comment: Methicillin (oxacillin)-resistant Staphylococcus aureus (MRSA). MRSA is predictably resistant to beta-lactam antibiotics (except ceftaroline). Preferred therapy is vancomycin unless clinically contraindicated. Patient requires contact precautions if  hospitalized. CRITICAL RESULT CALLED TO, READ BACK BY AND VERIFIED WITH: E. SINCLAIR PHARMD, AT 1458 04/11/23 D. VANHOOK    Staphylococcus epidermidis NOT DETECTED NOT DETECTED Final   Staphylococcus lugdunensis NOT DETECTED NOT DETECTED Final    Streptococcus species NOT DETECTED NOT DETECTED Final   Streptococcus agalactiae NOT DETECTED NOT DETECTED Final   Streptococcus pneumoniae NOT DETECTED NOT DETECTED Final   Streptococcus pyogenes NOT DETECTED NOT DETECTED Final   A.calcoaceticus-baumannii NOT DETECTED NOT DETECTED  Final   Bacteroides fragilis NOT DETECTED NOT DETECTED Final   Enterobacterales DETECTED (A) NOT DETECTED Final    Comment: Enterobacterales represent a large order of gram negative bacteria, not a single organism. Refer to culture for further identification. CRITICAL RESULT CALLED TO, READ BACK BY AND VERIFIED WITH: E. SINCLAIR PHARMD, AT 1458 04/11/23 D. VANHOOK    Enterobacter cloacae complex NOT DETECTED NOT DETECTED Final   Escherichia coli NOT DETECTED NOT DETECTED Final   Klebsiella aerogenes NOT DETECTED NOT DETECTED Final   Klebsiella oxytoca NOT DETECTED NOT DETECTED Final   Klebsiella pneumoniae NOT DETECTED NOT DETECTED Final   Proteus species NOT DETECTED NOT DETECTED Final   Salmonella species NOT DETECTED NOT DETECTED Final   Serratia marcescens NOT DETECTED NOT DETECTED Final   Haemophilus influenzae NOT DETECTED NOT DETECTED Final   Neisseria meningitidis NOT DETECTED NOT DETECTED Final   Pseudomonas aeruginosa NOT DETECTED NOT DETECTED Final   Stenotrophomonas maltophilia NOT DETECTED NOT DETECTED Final   Candida albicans NOT DETECTED NOT DETECTED Final   Candida auris NOT DETECTED NOT DETECTED Final   Candida glabrata NOT DETECTED NOT DETECTED Final   Candida krusei NOT DETECTED NOT DETECTED Final   Candida parapsilosis NOT DETECTED NOT DETECTED Final   Candida tropicalis NOT DETECTED NOT DETECTED Final   Cryptococcus neoformans/gattii NOT DETECTED NOT DETECTED Final   CTX-M ESBL NOT DETECTED NOT DETECTED Final   Carbapenem resistance IMP NOT DETECTED NOT DETECTED Final   Carbapenem resistance KPC NOT DETECTED NOT DETECTED Final   Meth resistant mecA/C and MREJ DETECTED (A) NOT  DETECTED Final    Comment: CRITICAL RESULT CALLED TO, READ BACK BY AND VERIFIED WITH: E. SINCLAIR PHARMD, AT 1458 04/11/23 D. VANHOOK    Carbapenem resistance NDM NOT DETECTED NOT DETECTED Final   Carbapenem resist OXA 48 LIKE NOT DETECTED NOT DETECTED Final   Carbapenem resistance VIM NOT DETECTED NOT DETECTED Final    Comment: Performed at [facility], [address], [place], [place] [address]  Blood culture (routine x 2)     Status: Abnormal   Collection Time: 04/10/23 11:09 PM   Specimen: BLOOD  Result Value Ref Range Status   Specimen Description BLOOD SITE NOT SPECIFIED  Final   Special Requests   Final    BOTTLES DRAWN AEROBIC AND ANAEROBIC Blood Culture results may not be optimal due to an excessive volume of blood received in culture bottles   Culture  Setup Time   Final    GRAM POSITIVE COCCI IN CLUSTERS IN BOTH AEROBIC AND ANAEROBIC BOTTLES GRAM NEGATIVE RODS AEROBIC BOTTLE ONLY    Culture (A)  Final    STAPHYLOCOCCUS AUREUS PROVIDENCIA RETTGERI SUSCEPTIBILITIES PERFORMED ON PREVIOUS CULTURE WITHIN THE LAST 5 DAYS. Performed at [facility] Lab, [address], [place], [place] [address]    Report Status 04/13/2023 FINAL  Final  Resp panel by RT-PCR (RSV, Flu A&B, Covid) Anterior Nasal Swab     Status: None   Collection Time: 04/10/23 11:47 PM   Specimen: Anterior Nasal Swab  Result Value Ref Range Status   SARS Coronavirus 2 by RT PCR NEGATIVE NEGATIVE Final   Influenza A by PCR NEGATIVE NEGATIVE Final   Influenza B by PCR NEGATIVE NEGATIVE Final    Comment: (NOTE) The Xpert Xpress SARS-CoV-2/FLU/RSV plus assay is intended as an aid in the diagnosis of influenza from Nasopharyngeal swab specimens and should not be used as a sole basis for treatment. Nasal washings and aspirates are  unacceptable for Xpert Xpress SARS-CoV-2/FLU/RSV testing.  Fact Sheet for Patients: BloggerCourse.com  Fact Sheet for Healthcare  Providers: SeriousBroker.it  This test is not yet approved or cleared by the [place] FDA and has been authorized for detection and/or diagnosis of SARS-CoV-2 by FDA under an Emergency Use Authorization (EUA). This EUA will remain in effect (meaning this test can be used) for the duration of the COVID-19 declaration under Section 564(b)(1) of the Act, 21 U.S.C. section 360bbb-3(b)(1), unless the authorization is terminated or revoked.     Resp Syncytial Virus by PCR NEGATIVE NEGATIVE Final    Comment: (NOTE) Fact Sheet for Patients: BloggerCourse.com  Fact Sheet for Healthcare Providers: SeriousBroker.it  This test is not yet approved or cleared by the [place] FDA and has been authorized for detection and/or diagnosis of SARS-CoV-2 by FDA under an Emergency Use Authorization (EUA). This EUA will remain in effect (meaning this test can be used) for the duration of the COVID-19 declaration under Section 564(b)(1) of the Act, 21 U.S.C. section 360bbb-3(b)(1), unless the authorization is terminated or revoked.  Performed at [facility] Lab, [address], [place], [place] [address]   Respiratory (~20 pathogens) panel by PCR     Status: None   Collection Time: 04/11/23  8:20 AM   Specimen: Nasopharyngeal Swab; Respiratory  Result Value Ref Range Status   Adenovirus NOT DETECTED NOT DETECTED Final   Coronavirus 229E NOT DETECTED NOT DETECTED Final    Comment: (NOTE) The Coronavirus on the Respiratory Panel, DOES NOT test for the novel  Coronavirus (2019 nCoV)    Coronavirus HKU1 NOT DETECTED NOT DETECTED Final   Coronavirus NL63 NOT DETECTED NOT DETECTED Final   Coronavirus OC43 NOT DETECTED NOT DETECTED Final   Metapneumovirus NOT DETECTED NOT DETECTED Final   Rhinovirus / Enterovirus NOT DETECTED NOT DETECTED Final   Influenza A NOT DETECTED NOT DETECTED Final   Influenza B NOT DETECTED  NOT DETECTED Final   Parainfluenza Virus 1 NOT DETECTED NOT DETECTED Final   Parainfluenza Virus 2 NOT DETECTED NOT DETECTED Final   Parainfluenza Virus 3 NOT DETECTED NOT DETECTED Final   Parainfluenza Virus 4 NOT DETECTED NOT DETECTED Final   Respiratory Syncytial Virus NOT DETECTED NOT DETECTED Final   Bordetella pertussis NOT DETECTED NOT DETECTED Final   Bordetella Parapertussis NOT DETECTED NOT DETECTED Final   Chlamydophila pneumoniae NOT DETECTED NOT DETECTED Final   Mycoplasma pneumoniae NOT DETECTED NOT DETECTED Final    Comment: Performed at [facility], [address], [place], [place] [address]  Culture, blood (Routine X 2) w Reflex to ID Panel     Status: None (Preliminary result)   Collection Time: 04/12/23  2:54 AM   Specimen: BLOOD RIGHT HAND  Result Value Ref Range Status   Specimen Description BLOOD RIGHT HAND  Final   Special Requests   Final    BOTTLES DRAWN AEROBIC ONLY Blood Culture adequate volume   Culture   Final    NO GROWTH 2 DAYS Performed at [facility], [address] [place], [place] [address]    Report Status PENDING  Incomplete  Culture, blood (Routine X 2) w Reflex to ID Panel     Status: None (Preliminary result)   Collection Time: 04/12/23  2:55 AM   Specimen: BLOOD RIGHT ARM  Result Value Ref Range Status   Specimen Description BLOOD RIGHT ARM  Final   Special Requests   Final    BOTTLES DRAWN AEROBIC  ONLY Blood Culture adequate volume   Culture   Final    NO GROWTH 2 DAYS Performed at [facility], [address], [place], [place] [address]    Report Status PENDING  Incomplete         Radiology Studies: DG Swallowing Func-Speech Pathology  Result Date: 04/12/2023 Table formatting from the original result was not included. Modified Barium Swallow Study Patient Details Name: Micheal Mayner. MRN: [id number] Date of Birth: 1951-07-04 Today's Date: 04/12/2023 HPI/PMH: HPI: Pt is a 72 yo male presenting to ED  7/25 presenting with nausea, vomiting, and fever. Per MD note, pt's wife reports pt does not ambulate and is bedbound. She also reports that he lays flat in bed to eat/drink. CXR revealed central peribronchial wall thickening with some interstitial and patchy opacities in both lower lobes. PMH includes cancer, T2DM, dizziness with frequent falls, DVT, syncope Clinical Impression: Clinical Impression: Pt presents with an overall functional oropharyngeal swallow. He effectively achieved complete laryngeal vestibule closure during the swallow. Pharyngeal residue present throughout trials of thicker consistencies along base of tongue, valleculae, and pyriform sinuses. He effectively manages this residue and it is cleared with an intermittent cough, which he initiates independently. No penetration or aspiration noted throughout all trials, although may be at greater risk if deconditioned or if he does not maintain a fully upright position while eating and drinking. The pill was administered with straw sips of thin liquids with prompt and complete clearance during the esophageal sweep. Recommend continuing diet of regular textures with thin liquids only when pt is positioned fully upright. Given recent emesis, recommend staying fully upright ~30 minutes after eating and drinking. SLP will f/u x1 to provide education regarding aspiration precautions. Factors that may increase risk of adverse event in presence of aspiration Rubye Oaks & Clearance Coots 2021): Factors that may increase risk of adverse event in presence of aspiration Rubye Oaks & Clearance Coots 2021): Limited mobility; Frail or deconditioned Recommendations/Plan: Swallowing Evaluation Recommendations Swallowing Evaluation Recommendations Recommendations: PO diet PO Diet Recommendation: Regular; Thin liquids (Level 0) Liquid Administration via: Cup; Straw Medication Administration: Whole meds with liquid Supervision: Patient able to self-feed Swallowing strategies  : Minimize  environmental distractions; Slow rate Postural changes: Position pt fully upright for meals; Stay upright 30-60 min after meals Oral care recommendations: Oral care BID (2x/day) Treatment Plan Treatment Plan Treatment recommendations: Therapy as outlined in treatment plan below Follow-up recommendations: No SLP follow up Functional status assessment: Patient has not had a recent decline in their functional status. Treatment frequency: Min 1x/week Treatment duration: 1 week Interventions: Aspiration precaution training; Patient/family education; Diet toleration management by SLP Recommendations Recommendations for follow up therapy are one component of a multi-disciplinary discharge planning process, led by the attending physician.  Recommendations may be updated based on patient status, additional functional criteria and insurance authorization. Assessment: Orofacial Exam: Orofacial Exam Oral Cavity: Oral Hygiene: WFL Oral Cavity - Dentition: Poor condition; Missing dentition Orofacial Anatomy: WFL Oral Motor/Sensory Function: WFL Anatomy: Anatomy: Suspected cervical osteophytes Boluses Administered: Boluses Administered Boluses Administered: Thin liquids (Level 0); Mildly thick liquids (Level 2, nectar thick); Moderately thick liquids (Level 3, honey thick); Puree; Solid  Oral Impairment Domain: Oral Impairment Domain Lip Closure: No labial escape Tongue control during bolus hold: Cohesive bolus between tongue to palatal seal Bolus preparation/mastication: Timely and efficient chewing and mashing Bolus transport/lingual motion: Brisk tongue motion Oral residue: Complete oral clearance Location of oral residue : N/A Initiation of pharyngeal swallow : Valleculae  Pharyngeal  Impairment Domain: Pharyngeal Impairment Domain Soft palate elevation: No bolus between soft palate (SP)/pharyngeal wall (PW) Laryngeal elevation: Complete superior movement of thyroid cartilage with complete approximation of arytenoids to  epiglottic petiole Anterior hyoid excursion: Complete anterior movement Epiglottic movement: Complete inversion Laryngeal vestibule closure: Complete, no air/contrast in laryngeal vestibule Pharyngeal stripping wave : Present - complete Pharyngeal contraction (A/P view only): N/A Pharyngoesophageal segment opening: Complete distension and complete duration, no obstruction of flow Tongue base retraction: Trace column of contrast or air between tongue base and PPW Pharyngeal residue: Collection of residue within or on pharyngeal structures Location of pharyngeal residue: Valleculae; Tongue base; Pyriform sinuses  Esophageal Impairment Domain: Esophageal Impairment Domain Esophageal clearance upright position: Complete clearance, esophageal coating Pill: Pill Consistency administered: Thin liquids (Level 0) Thin liquids (Level 0): [facility] Penetration/Aspiration Scale Score: Penetration/Aspiration Scale Score 1.  Material does not enter airway: Thin liquids (Level 0); Mildly thick liquids (Level 2, nectar thick); Moderately thick liquids (Level 3, honey thick); Puree; Solid; Pill Compensatory Strategies: Compensatory Strategies Compensatory strategies: Yes Straw: Effective Effective Straw: Thin liquid (Level 0)   General Information: Caregiver present: No  Diet Prior to this Study: Regular; Thin liquids (Level 0)   Temperature : Normal   Respiratory Status: WFL   Supplemental O2: None (Room air)   History of Recent Intubation: No  Behavior/Cognition: Alert; Cooperative; Pleasant mood Self-Feeding Abilities: Able to self-feed Baseline vocal quality/speech: Normal Volitional Cough: Able to elicit Volitional Swallow: Able to elicit Exam Limitations: No limitations Goal Planning: Prognosis for improved oropharyngeal function: Good Barriers to Reach Goals: Time post onset No data recorded Patient/Family Stated Goal: none stated Consulted and agree with results and recommendations: Patient Pain: Pain Assessment Pain Assessment:  Faces Faces Pain Scale: 6 Pain Location: feet/toes when bumped/touched Pain Descriptors / Indicators: Guarding; Grimacing; Discomfort Pain Intervention(s): Monitored during session; Repositioned End of Session: Start Time:SLP Start Time (ACUTE ONLY): 1352 Stop Time: SLP Stop Time (ACUTE ONLY): 1411 Time Calculation:SLP Time Calculation (min) (ACUTE ONLY): 19 min Charges: SLP Evaluations $ SLP Speech Visit: 1 Visit SLP Evaluations $BSS Swallow: 1 Procedure $MBS Swallow: 1 Procedure SLP visit diagnosis: SLP Visit Diagnosis: Dysphagia, pharyngeal phase (R13.13) Past Medical History: Past Medical History: Diagnosis Date  Cancer (HCC)   right elbow melanoma  Diabetes mellitus without complication (HCC)   Type II  Dizziness   DVT (deep venous thrombosis) (HCC)   right leg  Frequent falls   Near syncope   Syncope  Past Surgical History: Past Surgical History: Procedure Laterality Date  AMPUTATION TOE Left 04/28/2020  Procedure: AMPUTATION LEFT GREAT TOE;  Surgeon: Nadara Mustard, MD;  Location: [facility];  Service: Orthopedics;  Laterality: Left;  CORONARY/GRAFT ACUTE MI REVASCULARIZATION N/A 01/15/2023  Procedure: Coronary/Graft Acute MI Revascularization;  Surgeon: Orbie Pyo, MD;  Location: [facility];  Service: Cardiovascular;  Laterality: N/A;  INTRAMEDULLARY (IM) NAIL INTERTROCHANTERIC Right 02/23/2022  Procedure: INTRAMEDULLARY (IM) NAIL INTERTROCHANTRIC, RIGHT;  Surgeon: Samson Frederic, MD;  Location: [facility];  Service: Orthopedics;  Laterality: Right;  LEFT HEART CATH AND CORONARY ANGIOGRAPHY N/A 01/15/2023  Procedure: LEFT HEART CATH AND CORONARY ANGIOGRAPHY;  Surgeon: Orbie Pyo, MD;  [facility];  Service: Cardiovascular;  Laterality: N/A;  LOWER EXTREMITY ANGIOGRAPHY Bilateral 01/18/2023  Procedure: Lower Extremity Angiography;  Surgeon: Nada Libman, MD;  [facility];  Service: Cardiovascular;  Laterality: Bilateral;  MELANOMA EXCISION WITH SENTINEL LYMPH NODE  BIOPSY Right 08/19/2020  Procedure: WIDE LOCAL EXCISION  RIGHT ELBOW MELANOMA WITH RIGHT AXILLARY SENTINEL LYMPH NODE BIOPSY;  Surgeon: Fritzi Mandes, MD;  Location: [facility];  Service: General;  Laterality: Right;  2nd incision in axilla  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  Procedure: TOTAL HIP ARTHROPLASTY ANTERIOR APPROACH;  Surgeon: Samson Frederic, MD;  Location: [facility];  Service: Orthopedics;  Laterality: Left;  TOTAL HIP ARTHROPLASTY Left 08/24/2022  Procedure: OPEN REDUCTION, HEAD AND LINER EXCHANGE;  Surgeon: Samson Frederic, MD;  Location: [facility];  Service: Orthopedics;  Laterality: Left; Gwynneth Aliment, M.A., CF-SLP Speech Language Pathology, Acute Rehabilitation Services Secure Chat preferred [phone] 04/12/2023, 2:57 PM        Scheduled Meds:  aspirin EC  81 mg Oral Daily   empagliflozin  10 mg Oral Daily   enoxaparin (LOVENOX) injection  40 mg Subcutaneous Q24H   feeding supplement (GLUCERNA SHAKE)  237 mL Oral TID BM   fludrocortisone  0.2 mg Oral Daily   guaiFENesin  600 mg Oral BID   insulin aspart  0-9 Units Subcutaneous TID WC   insulin aspart  7 Units Subcutaneous TID WC   insulin glargine-yfgn  25 Units Subcutaneous QHS   midodrine  7.5 mg Oral TID WC   pantoprazole  40 mg Oral Daily   rosuvastatin  5 mg Oral Once per day on Monday Thursday   ticagrelor  90 mg Oral BID   Continuous Infusions:  ceFEPime (MAXIPIME) IV 2 g (04/14/23 0928)   vancomycin 166.7 mL/hr at 04/14/23 0706     LOS: 3 days    Time spent: 4 minutes spent on chart review, discussion with nursing staff, consultants, updating family and interview/physical exam; more than 50% of that time was spent in counseling and/or coordination of care.    Joseph Art, DO [facility] Available via Epic secure chat 7am-7pm After these hours, please refer to coverage provider listed on [email or web address] 04/14/2023, 1:58 PM

## 2023-04-14 NOTE — Plan of Care (Signed)
  Problem: Clinical Measurements: Goal: Ability to maintain a body temperature in the normal range will improve Outcome: Progressing   Problem: Respiratory: Goal: Ability to maintain adequate ventilation will improve Outcome: Progressing Goal: Ability to maintain a clear airway will improve Outcome: Progressing   Problem: Nutritional: Goal: Maintenance of adequate nutrition will improve Outcome: Progressing   Problem: Tissue Perfusion: Goal: Adequacy of tissue perfusion will improve Outcome: Progressing

## 2023-04-14 NOTE — Plan of Care (Signed)
  Problem: Clinical Measurements: Goal: Respiratory complications will improve Outcome: Progressing Goal: Cardiovascular complication will be avoided Outcome: Progressing   Problem: Nutrition: Goal: Adequate nutrition will be maintained Outcome: Progressing   Problem: Coping: Goal: Level of anxiety will decrease Outcome: Progressing   Problem: Elimination: Goal: Will not experience complications related to bowel motility Outcome: Progressing Goal: Will not experience complications related to urinary retention Outcome: Progressing   Problem: Pain Managment: Goal: General experience of comfort will improve Outcome: Progressing   Problem: Safety: Goal: Ability to remain free from injury will improve Outcome: Progressing   Problem: Skin Integrity: Goal: Risk for impaired skin integrity will decrease Outcome: Progressing   

## 2023-04-15 DIAGNOSIS — R7881 Bacteremia: Secondary | ICD-10-CM | POA: Diagnosis not present

## 2023-04-15 LAB — GLUCOSE, CAPILLARY
Glucose-Capillary: 147 mg/dL — ABNORMAL HIGH (ref 70–99)
Glucose-Capillary: 152 mg/dL — ABNORMAL HIGH (ref 70–99)
Glucose-Capillary: 110 mg/dL — ABNORMAL HIGH (ref 70–99)
Glucose-Capillary: 210 mg/dL — ABNORMAL HIGH (ref 70–99)

## 2023-04-15 NOTE — Plan of Care (Signed)
  Problem: Elimination: Goal: Will not experience complications related to bowel motility Outcome: Progressing Goal: Will not experience complications related to urinary retention Outcome: Progressing   Problem: Pain Managment: Goal: General experience of comfort will improve Outcome: Progressing   Problem: Safety: Goal: Ability to remain free from injury will improve Outcome: Progressing   Problem: Skin Integrity: Goal: Risk for impaired skin integrity will decrease Outcome: Progressing   Problem: Respiratory: Goal: Ability to maintain adequate ventilation will improve Outcome: Progressing   Problem: Metabolic: Goal: Ability to maintain appropriate glucose levels will improve Outcome: Progressing   Problem: Nutritional: Goal: Maintenance of adequate nutrition will improve Outcome: Progressing

## 2023-04-15 NOTE — Progress Notes (Addendum)
PROGRESS NOTE    Micheal Moore.  WJX:[id number] DOB: [birth year] DOA: 04/10/2023 PCP: [name], MD    Brief Narrative:   Micheal Moore. is a 72 y.o. male with medical history significant of CAD s/p PCI, syncope, orthostatic hypotension, diabetes mellitus type 2, DVT of right leg, peripheral artery disease, right elbow melanoma s/p excision who presents with nausea and vomiting.  History is obtained from patient and with the assistance of his wife over the phone.  Patient states that he does not ambulate and is bedbound.  However, over the phone wife makes note that no one ever signed off to say that he cannot walk.  She reports that she takes care of him and cleans him, but does expect him to do certain things for himself such as giving himself meds as he can see.  She reports that he lays in a hospital bed and eat while laying flat when he knows he should be sitting up.  Also makes note he possibly does not always take his medications as he should despite her laying them out for him.  Last night patient developed acute onset of nausea and vomiting, but had been in his normal state of health prior to that as he had been seen by his primary care provider just 2 days ago.  He had not had any cough.  In route with EMS patient was noted to be febrile up to 100.5 F.  Found to have bacteremia- source unclear.    Assessment and Plan: Suspected aspiration pneumonia Acute.  Patient presents with complaints of nausea and vomiting.  Chest x-ray noted central peribronchial wall thickening within some interstitial and patchy opacities in both lower lungs concerning for atypical/viral infection.  Influenza, COVID-19, and RSV screening were negative.  Procalcitonin was elevated at 0.4.  Based off history suspect possible aspiration pneumonia/bacterial pneumonia. -see below re: blood cultures -Incentive spirometry and flutter valve -see below abx -Mucinex   Bacteremia -ID  consult- -Vancomycin + cefepime -Repeat blood cultures -TTE- no vegetations-- have placed consult for TEE-- will not happen until Thursday/Friday ? Wounds vs use of insulin needles over and over  Nausea and vomiting -resolved   Orthostatic hypotension Chronic.  -Continue midodrine and fludrocortisone   Elevated troponin CAD Acute.  High-sensitivity troponin 14->18.  Patient with recent inferior STEMI on 4/30 with stent placement..  Not on beta-blocker due to hypotension. -Continue Brilinta and aspirin   Hypokalemia -repleted   Uncontrolled diabetes mellitus type 2, with long-term use of insulin Last available hemoglobin A1c was 13.3 on  01/15/2023.  Patient reports taking Lantus 30-35 units nightly, NovoLog 8 to 10 units of 3 times daily with meals. -patient reports using pen needles over and over-educated about this   Normocytic anemia -trend   Peripheral vascular disease Patient with prior left abnormal ABI toe brachial index where he had aortogram with bilateral runoff with no intervention needed.  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 -Continue Brilinta and statin   Hyperlipidemia -Continue Crestor   History of DVT Patient previously had been on Eliquis, but was not currently taking and just on aspirin.   Pressure ulcer of the left heel -Wound care consult:  cleanse wounds to left heel and left lower leg with VASHE solution (LAWSON # [id number]) and pat dry. Apply VASHE moist gauze and cover with foam dressing. Change gauze daily and foam every other day.    Debility -PT/OT - no PT follow up  Plan is home with wife once abx plan in place    DVT prophylaxis: enoxaparin (LOVENOX) injection 40 mg Start: 04/11/23 1000 SCDs Start: 04/11/23 0133    Code Status: Full Code Spoke with wife 7/29  Disposition Plan:  Level of care: Telemetry Medical Status is: Inpatient Remains inpatient appropriate     Consultants:  ID Cards  (TEE)   Subjective: No SOB, no CP  Objective: Vitals:   04/14/23 2003 04/15/23 0439 04/15/23 0500 04/15/23 0832  BP: 138/78 132/79  (!) 149/80  Pulse: 84 89  89  Resp: 16 16  16   Temp: 98.4 F (36.9 C) 98.3 F (36.8 C)  98.3 F (36.8 C)  TempSrc: Oral Oral    SpO2: 95% 96%  98%  Weight:   89.5 kg     Intake/Output Summary (Last 24 hours) at 04/15/2023 1218 Last data filed at 04/15/2023 6295 Gross per 24 hour  Intake 1733.97 ml  Output 3075 ml  Net -1341.03 ml   Filed Weights   04/12/23 0500 04/14/23 0500 04/15/23 0500  Weight: 87.3 kg 88.8 kg 89.5 kg    Examination:   General: Appearance:    Well developed, well nourished male in no acute distress     Lungs:     respirations unlabored  Heart:    Normal heart rate. .    MS:   Amputations on foot noted   Neurologic:   Awake, alert       Data Reviewed: I have personally reviewed following labs and imaging studies  CBC: Recent Labs  Lab 04/10/23 2255 04/11/23 0445 04/13/23 0323  WBC 9.0 8.9 5.0  NEUTROABS 7.6 7.2  --   HGB 12.7* 12.6* 11.1*  HCT 39.0 38.3* 34.0*  MCV 94.7 92.1 90.9  PLT 272 212 212   Basic Metabolic Panel: Recent Labs  Lab 04/10/23 2255 04/11/23 0236 04/12/23 0254 04/13/23 0323  NA 133* 132* 132* 136  K 3.1* 3.2* 3.4* 3.9  CL 99 97* 95* 105  CO2 21* 21* 25 23  GLUCOSE 119* 140* 141* 156*  BUN 18 20 29* 23  CREATININE 0.72 0.78 0.81 0.67  CALCIUM 8.7* 8.7* 8.8* 8.6*  MG  --  1.9  --   --   PHOS  --  3.8  --   --    GFR: Estimated Creatinine Clearance: 105.2 mL/min (by C-G formula based on SCr of 0.67 mg/dL). Liver Function Tests: Recent Labs  Lab 04/10/23 2255 04/11/23 0236  AST 14* 14*  ALT 12 12  ALKPHOS 76 74  BILITOT 1.4* 1.4*  PROT 7.0 6.7  ALBUMIN 3.3* 3.3*   Recent Labs  Lab 04/10/23 2255  LIPASE 23   No results for input(s): "AMMONIA" in the last 168 hours. Coagulation Profile: No results for input(s): "INR", "PROTIME" in the last 168  hours. Cardiac Enzymes: No results for input(s): "CKTOTAL", "CKMB", "CKMBINDEX", "TROPONINI" in the last 168 hours. BNP (last 3 results) No results for input(s): "PROBNP" in the last 8760 hours. HbA1C: No results for input(s): "HGBA1C" in the last 72 hours. CBG: Recent Labs  Lab 04/14/23 1110 04/14/23 1609 04/14/23 2008 04/15/23 0730 04/15/23 1106  GLUCAP 172* 114* 97 147* 152*   Lipid Profile: No results for input(s): "CHOL", "HDL", "LDLCALC", "TRIG", "CHOLHDL", "LDLDIRECT" in the last 72 hours. Thyroid Function Tests: No results for input(s): "TSH", "T4TOTAL", "FREET4", "T3FREE", "THYROIDAB" in the last 72 hours. Anemia Panel: No results for input(s): "VITAMINB12", "FOLATE", "FERRITIN", "TIBC", "IRON", "RETICCTPCT" in the last 72 hours.  Sepsis Labs: Recent Labs  Lab 04/10/23 2302 04/11/23 0140 04/11/23 0236  PROCALCITON  --   --  0.40  LATICACIDVEN 1.1 1.6  --     Recent Results (from the past 240 hour(s))  Blood culture (routine x 2)     Status: Abnormal   Collection Time: 04/10/23 10:55 PM   Specimen: BLOOD  Result Value Ref Range Status   Specimen Description BLOOD SITE NOT SPECIFIED  Final   Special Requests   Final    BOTTLES DRAWN AEROBIC AND ANAEROBIC Blood Culture results may not be optimal due to an excessive volume of blood received in culture bottles   Culture  Setup Time   Final    GRAM NEGATIVE RODS GRAM POSITIVE COCCI IN CLUSTERS IN BOTH AEROBIC AND ANAEROBIC BOTTLES CRITICAL RESULT CALLED TO, READ BACK BY AND VERIFIED WITHNorva Riffle PHARMD, AT 1458 04/11/23 Renato Shin Performed at [facility], [address], [place], [place] [address]    Culture (A)  Final    METHICILLIN RESISTANT STAPHYLOCOCCUS AUREUS PROVIDENCIA RETTGERI    Report Status 04/13/2023 FINAL  Final   Organism ID, Bacteria METHICILLIN RESISTANT STAPHYLOCOCCUS AUREUS  Final   Organism ID, Bacteria PROVIDENCIA RETTGERI  Final      Susceptibility   Methicillin resistant  staphylococcus aureus - MIC*    CIPROFLOXACIN >=8 RESISTANT Resistant     ERYTHROMYCIN >=8 RESISTANT Resistant     GENTAMICIN <=0.5 SENSITIVE Sensitive     OXACILLIN >=4 RESISTANT Resistant     TETRACYCLINE <=1 SENSITIVE Sensitive     VANCOMYCIN <=0.5 SENSITIVE Sensitive     TRIMETH/SULFA <=10 SENSITIVE Sensitive     CLINDAMYCIN RESISTANT Resistant     RIFAMPIN <=0.5 SENSITIVE Sensitive     Inducible Clindamycin POSITIVE Resistant     LINEZOLID 2 SENSITIVE Sensitive     * METHICILLIN RESISTANT STAPHYLOCOCCUS AUREUS   Providencia rettgeri - MIC*    AMPICILLIN RESISTANT Resistant     CEFEPIME <=0.12 SENSITIVE Sensitive     CEFTAZIDIME <=1 SENSITIVE Sensitive     CEFTRIAXONE <=0.25 SENSITIVE Sensitive     CIPROFLOXACIN <=0.25 SENSITIVE Sensitive     GENTAMICIN <=1 SENSITIVE Sensitive     IMIPENEM 1 SENSITIVE Sensitive     TRIMETH/SULFA <=20 SENSITIVE Sensitive     AMPICILLIN/SULBACTAM <=2 SENSITIVE Sensitive     PIP/TAZO <=4 SENSITIVE Sensitive     * PROVIDENCIA RETTGERI  Blood Culture ID Panel (Reflexed)     Status: Abnormal   Collection Time: 04/10/23 10:55 PM  Result Value Ref Range Status   Enterococcus faecalis NOT DETECTED NOT DETECTED Final   Enterococcus Faecium NOT DETECTED NOT DETECTED Final   Listeria monocytogenes NOT DETECTED NOT DETECTED Final   Staphylococcus species DETECTED (A) NOT DETECTED Final    Comment: CRITICAL RESULT CALLED TO, READ BACK BY AND VERIFIED WITH: E. SINCLAIR PHARMD, AT 1458 04/11/23 D. VANHOOK    Staphylococcus aureus (BCID) DETECTED (A) NOT DETECTED Final    Comment: Methicillin (oxacillin)-resistant Staphylococcus aureus (MRSA). MRSA is predictably resistant to beta-lactam antibiotics (except ceftaroline). Preferred therapy is vancomycin unless clinically contraindicated. Patient requires contact precautions if  hospitalized. CRITICAL RESULT CALLED TO, READ BACK BY AND VERIFIED WITH: E. SINCLAIR PHARMD, AT 1458 04/11/23 D. VANHOOK     Staphylococcus epidermidis NOT DETECTED NOT DETECTED Final   Staphylococcus lugdunensis NOT DETECTED NOT DETECTED Final   Streptococcus species NOT DETECTED NOT DETECTED Final   Streptococcus agalactiae NOT DETECTED NOT DETECTED Final   Streptococcus pneumoniae NOT  DETECTED NOT DETECTED Final   Streptococcus pyogenes NOT DETECTED NOT DETECTED Final   A.calcoaceticus-baumannii NOT DETECTED NOT DETECTED Final   Bacteroides fragilis NOT DETECTED NOT DETECTED Final   Enterobacterales DETECTED (A) NOT DETECTED Final    Comment: Enterobacterales represent a large order of gram negative bacteria, not a single organism. Refer to culture for further identification. CRITICAL RESULT CALLED TO, READ BACK BY AND VERIFIED WITH: E. SINCLAIR PHARMD, AT 1458 04/11/23 D. VANHOOK    Enterobacter cloacae complex NOT DETECTED NOT DETECTED Final   Escherichia coli NOT DETECTED NOT DETECTED Final   Klebsiella aerogenes NOT DETECTED NOT DETECTED Final   Klebsiella oxytoca NOT DETECTED NOT DETECTED Final   Klebsiella pneumoniae NOT DETECTED NOT DETECTED Final   Proteus species NOT DETECTED NOT DETECTED Final   Salmonella species NOT DETECTED NOT DETECTED Final   Serratia marcescens NOT DETECTED NOT DETECTED Final   Haemophilus influenzae NOT DETECTED NOT DETECTED Final   Neisseria meningitidis NOT DETECTED NOT DETECTED Final   Pseudomonas aeruginosa NOT DETECTED NOT DETECTED Final   Stenotrophomonas maltophilia NOT DETECTED NOT DETECTED Final   Candida albicans NOT DETECTED NOT DETECTED Final   Candida auris NOT DETECTED NOT DETECTED Final   Candida glabrata NOT DETECTED NOT DETECTED Final   Candida krusei NOT DETECTED NOT DETECTED Final   Candida parapsilosis NOT DETECTED NOT DETECTED Final   Candida tropicalis NOT DETECTED NOT DETECTED Final   Cryptococcus neoformans/gattii NOT DETECTED NOT DETECTED Final   CTX-M ESBL NOT DETECTED NOT DETECTED Final   Carbapenem resistance IMP NOT DETECTED NOT DETECTED  Final   Carbapenem resistance KPC NOT DETECTED NOT DETECTED Final   Meth resistant mecA/C and MREJ DETECTED (A) NOT DETECTED Final    Comment: CRITICAL RESULT CALLED TO, READ BACK BY AND VERIFIED WITH: E. SINCLAIR PHARMD, AT 1458 04/11/23 D. VANHOOK    Carbapenem resistance NDM NOT DETECTED NOT DETECTED Final   Carbapenem resist OXA 48 LIKE NOT DETECTED NOT DETECTED Final   Carbapenem resistance VIM NOT DETECTED NOT DETECTED Final    Comment: Performed at [facility], [address] [place], [place] [address]  Blood culture (routine x 2)     Status: Abnormal   Collection Time: 04/10/23 11:09 PM   Specimen: BLOOD  Result Value Ref Range Status   Specimen Description BLOOD SITE NOT SPECIFIED  Final   Special Requests   Final    BOTTLES DRAWN AEROBIC AND ANAEROBIC Blood Culture results may not be optimal due to an excessive volume of blood received in culture bottles   Culture  Setup Time   Final    GRAM POSITIVE COCCI IN CLUSTERS IN BOTH AEROBIC AND ANAEROBIC BOTTLES GRAM NEGATIVE RODS AEROBIC BOTTLE ONLY    Culture (A)  Final    STAPHYLOCOCCUS AUREUS PROVIDENCIA RETTGERI SUSCEPTIBILITIES PERFORMED ON PREVIOUS CULTURE WITHIN THE LAST 5 DAYS. Performed at [facility], [address] [place], [place] [address]    Report Status 04/13/2023 FINAL  Final  Resp panel by RT-PCR (RSV, Flu A&B, Covid) Anterior Nasal Swab     Status: None   Collection Time: 04/10/23 11:47 PM   Specimen: Anterior Nasal Swab  Result Value Ref Range Status   SARS Coronavirus 2 by RT PCR NEGATIVE NEGATIVE Final   Influenza A by PCR NEGATIVE NEGATIVE Final   Influenza B by PCR NEGATIVE NEGATIVE Final    Comment: (NOTE) The Xpert Xpress SARS-CoV-2/FLU/RSV plus assay is intended as an aid in the diagnosis of influenza  from Nasopharyngeal swab specimens and should not be used as a sole basis for treatment. Nasal washings and aspirates are unacceptable for Xpert Xpress  SARS-CoV-2/FLU/RSV testing.  Fact Sheet for Patients: BloggerCourse.com  Fact Sheet for Healthcare Providers: SeriousBroker.it  This test is not yet approved or cleared by the [place] FDA and has been authorized for detection and/or diagnosis of SARS-CoV-2 by FDA under an Emergency Use Authorization (EUA). This EUA will remain in effect (meaning this test can be used) for the duration of the COVID-19 declaration under Section 564(b)(1) of the Act, 21 U.S.C. section 360bbb-3(b)(1), unless the authorization is terminated or revoked.     Resp Syncytial Virus by PCR NEGATIVE NEGATIVE Final    Comment: (NOTE) Fact Sheet for Patients: BloggerCourse.com  Fact Sheet for Healthcare Providers: SeriousBroker.it  This test is not yet approved or cleared by the [place] FDA and has been authorized for detection and/or diagnosis of SARS-CoV-2 by FDA under an Emergency Use Authorization (EUA). This EUA will remain in effect (meaning this test can be used) for the duration of the COVID-19 declaration under Section 564(b)(1) of the Act, 21 U.S.C. section 360bbb-3(b)(1), unless the authorization is terminated or revoked.  Performed at [facility], [address], [place], [place] [address]   Respiratory (~20 pathogens) panel by PCR     Status: None   Collection Time: 04/11/23  8:20 AM   Specimen: Nasopharyngeal Swab; Respiratory  Result Value Ref Range Status   Adenovirus NOT DETECTED NOT DETECTED Final   Coronavirus 229E NOT DETECTED NOT DETECTED Final    Comment: (NOTE) The Coronavirus on the Respiratory Panel, DOES NOT test for the novel  Coronavirus (2019 nCoV)    Coronavirus HKU1 NOT DETECTED NOT DETECTED Final   Coronavirus NL63 NOT DETECTED NOT DETECTED Final   Coronavirus OC43 NOT DETECTED NOT DETECTED Final   Metapneumovirus NOT DETECTED NOT DETECTED Final    Rhinovirus / Enterovirus NOT DETECTED NOT DETECTED Final   Influenza A NOT DETECTED NOT DETECTED Final   Influenza B NOT DETECTED NOT DETECTED Final   Parainfluenza Virus 1 NOT DETECTED NOT DETECTED Final   Parainfluenza Virus 2 NOT DETECTED NOT DETECTED Final   Parainfluenza Virus 3 NOT DETECTED NOT DETECTED Final   Parainfluenza Virus 4 NOT DETECTED NOT DETECTED Final   Respiratory Syncytial Virus NOT DETECTED NOT DETECTED Final   Bordetella pertussis NOT DETECTED NOT DETECTED Final   Bordetella Parapertussis NOT DETECTED NOT DETECTED Final   Chlamydophila pneumoniae NOT DETECTED NOT DETECTED Final   Mycoplasma pneumoniae NOT DETECTED NOT DETECTED Final    Comment: Performed at [facility], [address], [place], [place] [address]  Culture, blood (Routine X 2) w Reflex to ID Panel     Status: None (Preliminary result)   Collection Time: 04/12/23  2:54 AM   Specimen: BLOOD RIGHT HAND  Result Value Ref Range Status   Specimen Description BLOOD RIGHT HAND  Final   Special Requests   Final    BOTTLES DRAWN AEROBIC ONLY Blood Culture adequate volume   Culture   Final    NO GROWTH 2 DAYS Performed at [facility], [address] [place], [place] [address]    Report Status PENDING  Incomplete  Culture, blood (Routine X 2) w Reflex to ID Panel     Status: None (Preliminary result)   Collection Time: 04/12/23  2:55 AM   Specimen: BLOOD RIGHT ARM  Result Value Ref Range Status  Specimen Description BLOOD RIGHT ARM  Final   Special Requests   Final    BOTTLES DRAWN AEROBIC ONLY Blood Culture adequate volume   Culture   Final    NO GROWTH 2 DAYS Performed at [facility], [address], [place], [place] [address]    Report Status PENDING  Incomplete         Radiology Studies: No results found.      Scheduled Meds:  aspirin EC  81 mg Oral Daily   empagliflozin  10 mg Oral Daily   enoxaparin (LOVENOX) injection  40 mg Subcutaneous Q24H   feeding  supplement (GLUCERNA SHAKE)  237 mL Oral TID BM   fludrocortisone  0.2 mg Oral Daily   guaiFENesin  600 mg Oral BID   insulin aspart  0-9 Units Subcutaneous TID WC   insulin aspart  7 Units Subcutaneous TID WC   insulin glargine-yfgn  25 Units Subcutaneous QHS   midodrine  7.5 mg Oral TID WC   pantoprazole  40 mg Oral Daily   rosuvastatin  5 mg Oral Once per day on Monday Thursday   ticagrelor  90 mg Oral BID   Continuous Infusions:  ceFEPime (MAXIPIME) IV 2 g (04/15/23 0832)   vancomycin 1,250 mg (04/15/23 0632)     LOS: 4 days    Time spent: 4 minutes spent on chart review, discussion with nursing staff, consultants, updating family and interview/physical exam; more than 50% of that time was spent in counseling and/or coordination of care.    Joseph Art, DO [facility] Available via Epic secure chat 7am-7pm After these hours, please refer to coverage provider listed on [email or web address] 04/15/2023, 12:18 PM

## 2023-04-15 NOTE — TOC Progression Note (Signed)
Transition of Care (TOC) - Progression Note    Patient Details  Name: Rowe Silkworth. MRN: [id number] Date of Birth: [birth year]  Transition of Care [facility]) CM/SW Contact  Ronny Bacon, RN Phone Number: 04/15/2023, 4:38 PM  Clinical Narrative:  TEE to be done Thursday or Friday.          Expected Discharge Plan and Services                                               Social Determinants of Health (SDOH) Interventions SDOH Screenings   Food Insecurity: No Food Insecurity (01/16/2023)  Housing: Low Risk  (01/16/2023)  Transportation Needs: No Transportation Needs (01/16/2023)  Utilities: Not At Risk (01/16/2023)  Tobacco Use: High Risk (01/15/2023)    Readmission Risk Interventions    04/12/2023    4:47 PM 07/25/2022    2:42 PM  Readmission Risk Prevention Plan  Transportation Screening Complete   PCP or Specialist Appt within 5-7 Days    PCP or Specialist Appt within 3-5 Days Complete   Home Care Screening    Medication Review (RN CM)    HRI or Home Care Consult Complete   Social Work Consult for Recovery Care Planning/Counseling Complete   Palliative Care Screening Not Applicable   Medication Review (RN Care Manager) Referral to Pharmacy      Information is confidential and restricted. Go to Review Flowsheets to unlock data.

## 2023-04-16 DIAGNOSIS — J189 Pneumonia, unspecified organism: Secondary | ICD-10-CM | POA: Diagnosis not present

## 2023-04-16 DIAGNOSIS — R7881 Bacteremia: Secondary | ICD-10-CM | POA: Diagnosis not present

## 2023-04-16 DIAGNOSIS — E1169 Type 2 diabetes mellitus with other specified complication: Secondary | ICD-10-CM | POA: Diagnosis not present

## 2023-04-16 DIAGNOSIS — E785 Hyperlipidemia, unspecified: Secondary | ICD-10-CM | POA: Diagnosis not present

## 2023-04-16 DIAGNOSIS — J690 Pneumonitis due to inhalation of food and vomit: Secondary | ICD-10-CM | POA: Diagnosis not present

## 2023-04-16 DIAGNOSIS — Z79899 Other long term (current) drug therapy: Secondary | ICD-10-CM

## 2023-04-16 LAB — GLUCOSE, CAPILLARY
Glucose-Capillary: 217 mg/dL — ABNORMAL HIGH (ref 70–99)
Glucose-Capillary: 205 mg/dL — ABNORMAL HIGH (ref 70–99)
Glucose-Capillary: 195 mg/dL — ABNORMAL HIGH (ref 70–99)
Glucose-Capillary: 115 mg/dL — ABNORMAL HIGH (ref 70–99)

## 2023-04-16 LAB — VANCOMYCIN, TROUGH: Vancomycin Tr: 20 ug/mL (ref 15–20)

## 2023-04-16 LAB — VANCOMYCIN, PEAK: Vancomycin Pk: 39 ug/mL (ref 30–40)

## 2023-04-16 MED ORDER — VANCOMYCIN HCL 1500 MG/300ML IV SOLN
1500.0000 mg | INTRAVENOUS | Status: DC
  Filled 2023-04-16: qty 300

## 2023-04-16 NOTE — Progress Notes (Addendum)
I have seen and examined the patient. I have personally reviewed the clinical findings, laboratory findings, microbiological data and imaging studies. The assessment and treatment plan was discussed with the Nurse Practitioner Jeanine Luz. I agree with her/his recommendations except following additions/corrections.  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 Professional time spent includes the following activities: Preparing to see the patient (review of tests), Obtaining and/or reviewing separately obtained history (admission/discharge record), Performing a medically appropriate examination and/or evaluation , Ordering medications/tests/procedures, referring and communicating with other health care professionals, Documenting clinical information in the EMR, Independently interpreting results (not separately reported), Communicating results to the patient/family/caregiver, Counseling and educating the patient/family/caregiver and Care coordination (not separately reported).    [facility]  Date of Admission:  04/10/2023     Total days of antibiotics 7         ASSESSMENT:  Mr. Micheal Moore blood cultures from 04/12/23 have remained without growth with source of MRSA and Providencia bacteremia unclear with possibilities including wounds or reusing insulin needles. Scheduled for TEE on 8/2 to rule out endocarditis.  Continue to monitor blood cultures for clearance of bacteremia. Continue current dose of vancomycin and cefepime. Most recent vancomycin peak 39 and renal function stable. Remaining medical and supportive care per Internal Medicine.   PLAN:  Continue current dose of vancomycin and cefepime.  Monitor cultures for clearance of bacteremia.  Await TEE scheduled for 8/2.  Therapeutic drug monitoring of renal function and vancomycin levels. Remaining medical and supportive care per Internal Medicine.    I have personally spent 22 minutes involved in face-to-face and non-face-to-face activities for this patient on the day of the visit. Professional time spent includes the following activities: Preparing to see the patient (review of tests), Obtaining and/or reviewing separately obtained history (admission/discharge record), Performing a medically appropriate examination and/or evaluation , Ordering medications/tests/procedures, referring and communicating with other health care professionals, Documenting clinical information in the EMR, Independently interpreting results (not separately reported), Communicating results to the patient/family/caregiver, Counseling and educating the patient/family/caregiver and Care coordination (not separately reported).   Principal Problem:   Aspiration pneumonia (HCC) Active Problems:   Type 2 diabetes mellitus with hyperlipidemia (HCC)   History of DVT (deep vein thrombosis)   Hyperlipidemia   Orthostatic hypotension   Normocytic anemia   Pressure injury of skin   Nausea and vomiting   Elevated troponin   CAD (coronary artery disease)   Hypokalemia   PVD (peripheral vascular disease) (HCC)   Debility   Bacteremia    aspirin EC  81 mg Oral Daily   empagliflozin  10 mg Oral Daily   enoxaparin (LOVENOX) injection  40 mg Subcutaneous Q24H   feeding supplement (GLUCERNA SHAKE)  237 mL Oral TID BM   fludrocortisone  0.2 mg Oral Daily   guaiFENesin  600 mg  Oral BID  insulin aspart  0-9 Units Subcutaneous TID WC   insulin aspart  7 Units Subcutaneous TID WC   insulin glargine-yfgn  25 Units Subcutaneous QHS   midodrine  7.5 mg Oral TID WC   pantoprazole  40 mg Oral Daily   rosuvastatin  5 mg Oral Once per day on Monday Thursday   ticagrelor  90 mg Oral BID    SUBJECTIVE:  Afebrile overnight with no acute events. Wanting breakfast but is currently NPO.   No Known Allergies   Review of Systems: Review of Systems  Constitutional:  Negative for chills, fever and weight loss.  Respiratory:  Negative for cough, shortness of breath and wheezing.   Cardiovascular:  Negative for chest pain and leg swelling.  Gastrointestinal:  Negative for abdominal pain, constipation, diarrhea, nausea and vomiting.  Skin:  Negative for rash.      OBJECTIVE: Vitals:   04/15/23 2022 04/16/23 0500 04/16/23 0504 04/16/23 1007  BP: 124/65  114/69 (!) 148/83  Pulse: 82  81 85  Resp: 20  17 17   Temp: 98.2 F (36.8 C)  98.2 F (36.8 C) 98 F (36.7 C)  TempSrc:      SpO2: 97%  98% 100%  Weight:  85 kg     Body mass index is 22.22 kg/m.  Physical Exam Constitutional:      General: He is not in acute distress.    Appearance: He is well-developed.  Cardiovascular:     Rate and Rhythm: Normal rate and regular rhythm.     Heart sounds: Normal heart sounds.  Pulmonary:     Effort: Pulmonary effort is normal.     Breath sounds: Normal breath sounds.  Skin:    General: Skin is warm and dry.  Neurological:     Mental Status: He is alert.     Lab Results Lab Results  Component Value Date   WBC 5.0 04/13/2023   HGB 11.1 (L) 04/13/2023   HCT 34.0 (L) 04/13/2023   MCV 90.9 04/13/2023   PLT 212 04/13/2023    Lab Results  Component Value Date   CREATININE 0.73 04/16/2023   BUN 18 04/16/2023   NA 138 04/16/2023   K 3.8 04/16/2023   CL 103 04/16/2023   CO2 26 04/16/2023    Lab Results  Component Value Date   ALT 12 04/11/2023   AST 14  (L) 04/11/2023   ALKPHOS 74 04/11/2023   BILITOT 1.4 (H) 04/11/2023     Microbiology: Recent Results (from the past 240 hour(s))  Blood culture (routine x 2)     Status: Abnormal   Collection Time: 04/10/23 10:55 PM   Specimen: BLOOD  Result Value Ref Range Status   Specimen Description BLOOD SITE NOT SPECIFIED  Final   Special Requests   Final    BOTTLES DRAWN AEROBIC AND ANAEROBIC Blood Culture results may not be optimal due to an excessive volume of blood received in culture bottles   Culture  Setup Time   Final    GRAM NEGATIVE RODS GRAM POSITIVE COCCI IN CLUSTERS IN BOTH AEROBIC AND ANAEROBIC BOTTLES CRITICAL RESULT CALLED TO, READ BACK BY AND VERIFIED WITHNorva Riffle PHARMD, AT 1458 04/11/23 Renato Shin Performed at [facility], [address], [place], [place] [address]    Culture (A)  Final    METHICILLIN RESISTANT STAPHYLOCOCCUS AUREUS PROVIDENCIA RETTGERI    Report Status 04/13/2023 FINAL  Final   Organism ID, Bacteria METHICILLIN RESISTANT STAPHYLOCOCCUS AUREUS  Final  Organism ID, Bacteria PROVIDENCIA RETTGERI  Final      Susceptibility   Methicillin resistant staphylococcus aureus - MIC*    CIPROFLOXACIN >=8 RESISTANT Resistant     ERYTHROMYCIN >=8 RESISTANT Resistant     GENTAMICIN <=0.5 SENSITIVE Sensitive     OXACILLIN >=4 RESISTANT Resistant     TETRACYCLINE <=1 SENSITIVE Sensitive     VANCOMYCIN <=0.5 SENSITIVE Sensitive     TRIMETH/SULFA <=10 SENSITIVE Sensitive     CLINDAMYCIN RESISTANT Resistant     RIFAMPIN <=0.5 SENSITIVE Sensitive     Inducible Clindamycin POSITIVE Resistant     LINEZOLID 2 SENSITIVE Sensitive     * METHICILLIN RESISTANT STAPHYLOCOCCUS AUREUS   Providencia rettgeri - MIC*    AMPICILLIN RESISTANT Resistant     CEFEPIME <=0.12 SENSITIVE Sensitive     CEFTAZIDIME <=1 SENSITIVE Sensitive     CEFTRIAXONE <=0.25 SENSITIVE Sensitive     CIPROFLOXACIN <=0.25 SENSITIVE Sensitive     GENTAMICIN <=1 SENSITIVE Sensitive      IMIPENEM 1 SENSITIVE Sensitive     TRIMETH/SULFA <=20 SENSITIVE Sensitive     AMPICILLIN/SULBACTAM <=2 SENSITIVE Sensitive     PIP/TAZO <=4 SENSITIVE Sensitive     * PROVIDENCIA RETTGERI  Blood Culture ID Panel (Reflexed)     Status: Abnormal   Collection Time: 04/10/23 10:55 PM  Result Value Ref Range Status   Enterococcus faecalis NOT DETECTED NOT DETECTED Final   Enterococcus Faecium NOT DETECTED NOT DETECTED Final   Listeria monocytogenes NOT DETECTED NOT DETECTED Final   Staphylococcus species DETECTED (A) NOT DETECTED Final    Comment: CRITICAL RESULT CALLED TO, READ BACK BY AND VERIFIED WITH: E. SINCLAIR PHARMD, AT 1458 04/11/23 D. VANHOOK    Staphylococcus aureus (BCID) DETECTED (A) NOT DETECTED Final    Comment: Methicillin (oxacillin)-resistant Staphylococcus aureus (MRSA). MRSA is predictably resistant to beta-lactam antibiotics (except ceftaroline). Preferred therapy is vancomycin unless clinically contraindicated. Patient requires contact precautions if  hospitalized. CRITICAL RESULT CALLED TO, READ BACK BY AND VERIFIED WITH: E. SINCLAIR PHARMD, AT 1458 04/11/23 D. VANHOOK    Staphylococcus epidermidis NOT DETECTED NOT DETECTED Final   Staphylococcus lugdunensis NOT DETECTED NOT DETECTED Final   Streptococcus species NOT DETECTED NOT DETECTED Final   Streptococcus agalactiae NOT DETECTED NOT DETECTED Final   Streptococcus pneumoniae NOT DETECTED NOT DETECTED Final   Streptococcus pyogenes NOT DETECTED NOT DETECTED Final   A.calcoaceticus-baumannii NOT DETECTED NOT DETECTED Final   Bacteroides fragilis NOT DETECTED NOT DETECTED Final   Enterobacterales DETECTED (A) NOT DETECTED Final    Comment: Enterobacterales represent a large order of gram negative bacteria, not a single organism. Refer to culture for further identification. CRITICAL RESULT CALLED TO, READ BACK BY AND VERIFIED WITH: E. SINCLAIR PHARMD, AT 1458 04/11/23 D. VANHOOK    Enterobacter cloacae complex NOT  DETECTED NOT DETECTED Final   Escherichia coli NOT DETECTED NOT DETECTED Final   Klebsiella aerogenes NOT DETECTED NOT DETECTED Final   Klebsiella oxytoca NOT DETECTED NOT DETECTED Final   Klebsiella pneumoniae NOT DETECTED NOT DETECTED Final   Proteus species NOT DETECTED NOT DETECTED Final   Salmonella species NOT DETECTED NOT DETECTED Final   Serratia marcescens NOT DETECTED NOT DETECTED Final   Haemophilus influenzae NOT DETECTED NOT DETECTED Final   Neisseria meningitidis NOT DETECTED NOT DETECTED Final   Pseudomonas aeruginosa NOT DETECTED NOT DETECTED Final   Stenotrophomonas maltophilia NOT DETECTED NOT DETECTED Final   Candida albicans NOT DETECTED NOT DETECTED Final   Candida auris NOT DETECTED NOT  DETECTED Final   Candida glabrata NOT DETECTED NOT DETECTED Final   Candida krusei NOT DETECTED NOT DETECTED Final   Candida parapsilosis NOT DETECTED NOT DETECTED Final   Candida tropicalis NOT DETECTED NOT DETECTED Final   Cryptococcus neoformans/gattii NOT DETECTED NOT DETECTED Final   CTX-M ESBL NOT DETECTED NOT DETECTED Final   Carbapenem resistance IMP NOT DETECTED NOT DETECTED Final   Carbapenem resistance KPC NOT DETECTED NOT DETECTED Final   Meth resistant mecA/C and MREJ DETECTED (A) NOT DETECTED Final    Comment: CRITICAL RESULT CALLED TO, READ BACK BY AND VERIFIED WITH: E. SINCLAIR PHARMD, AT 1458 04/11/23 D. VANHOOK    Carbapenem resistance NDM NOT DETECTED NOT DETECTED Final   Carbapenem resist OXA 48 LIKE NOT DETECTED NOT DETECTED Final   Carbapenem resistance VIM NOT DETECTED NOT DETECTED Final    Comment: Performed at [facility], [address] [place], [place] [address]  Blood culture (routine x 2)     Status: Abnormal   Collection Time: 04/10/23 11:09 PM   Specimen: BLOOD  Result Value Ref Range Status   Specimen Description BLOOD SITE NOT SPECIFIED  Final   Special Requests   Final    BOTTLES DRAWN AEROBIC AND ANAEROBIC Blood Culture results may not  be optimal due to an excessive volume of blood received in culture bottles   Culture  Setup Time   Final    GRAM POSITIVE COCCI IN CLUSTERS IN BOTH AEROBIC AND ANAEROBIC BOTTLES GRAM NEGATIVE RODS AEROBIC BOTTLE ONLY    Culture (A)  Final    STAPHYLOCOCCUS AUREUS PROVIDENCIA RETTGERI SUSCEPTIBILITIES PERFORMED ON PREVIOUS CULTURE WITHIN THE LAST 5 DAYS. Performed at [facility], [address] [place], [place] [address]    Report Status 04/13/2023 FINAL  Final  Resp panel by RT-PCR (RSV, Flu A&B, Covid) Anterior Nasal Swab     Status: None   Collection Time: 04/10/23 11:47 PM   Specimen: Anterior Nasal Swab  Result Value Ref Range Status   SARS Coronavirus 2 by RT PCR NEGATIVE NEGATIVE Final   Influenza A by PCR NEGATIVE NEGATIVE Final   Influenza B by PCR NEGATIVE NEGATIVE Final    Comment: (NOTE) The Xpert Xpress SARS-CoV-2/FLU/RSV plus assay is intended as an aid in the diagnosis of influenza from Nasopharyngeal swab specimens and should not be used as a sole basis for treatment. Nasal washings and aspirates are unacceptable for Xpert Xpress SARS-CoV-2/FLU/RSV testing.  Fact Sheet for Patients: BloggerCourse.com  Fact Sheet for Healthcare Providers: SeriousBroker.it  This test is not yet approved or cleared by the [place] FDA and has been authorized for detection and/or diagnosis of SARS-CoV-2 by FDA under an Emergency Use Authorization (EUA). This EUA will remain in effect (meaning this test can be used) for the duration of the COVID-19 declaration under Section 564(b)(1) of the Act, 21 U.S.C. section 360bbb-3(b)(1), unless the authorization is terminated or revoked.     Resp Syncytial Virus by PCR NEGATIVE NEGATIVE Final    Comment: (NOTE) Fact Sheet for Patients: BloggerCourse.com  Fact Sheet for Healthcare Providers: SeriousBroker.it  This test is  not yet approved or cleared by the [place] FDA and has been authorized for detection and/or diagnosis of SARS-CoV-2 by FDA under an Emergency Use Authorization (EUA). This EUA will remain in effect (meaning this test can be used) for the duration of the COVID-19 declaration under Section 564(b)(1) of the Act, 21 U.S.C. section 360bbb-3(b)(1), unless the authorization is terminated or revoked.  Performed at [facility], [address], [place], [place] [address]   Respiratory (~20 pathogens) panel by PCR     Status: None   Collection Time: 04/11/23  8:20 AM   Specimen: Nasopharyngeal Swab; Respiratory  Result Value Ref Range Status   Adenovirus NOT DETECTED NOT DETECTED Final   Coronavirus 229E NOT DETECTED NOT DETECTED Final    Comment: (NOTE) The Coronavirus on the Respiratory Panel, DOES NOT test for the novel  Coronavirus (2019 nCoV)    Coronavirus HKU1 NOT DETECTED NOT DETECTED Final   Coronavirus NL63 NOT DETECTED NOT DETECTED Final   Coronavirus OC43 NOT DETECTED NOT DETECTED Final   Metapneumovirus NOT DETECTED NOT DETECTED Final   Rhinovirus / Enterovirus NOT DETECTED NOT DETECTED Final   Influenza A NOT DETECTED NOT DETECTED Final   Influenza B NOT DETECTED NOT DETECTED Final   Parainfluenza Virus 1 NOT DETECTED NOT DETECTED Final   Parainfluenza Virus 2 NOT DETECTED NOT DETECTED Final   Parainfluenza Virus 3 NOT DETECTED NOT DETECTED Final   Parainfluenza Virus 4 NOT DETECTED NOT DETECTED Final   Respiratory Syncytial Virus NOT DETECTED NOT DETECTED Final   Bordetella pertussis NOT DETECTED NOT DETECTED Final   Bordetella Parapertussis NOT DETECTED NOT DETECTED Final   Chlamydophila pneumoniae NOT DETECTED NOT DETECTED Final   Mycoplasma pneumoniae NOT DETECTED NOT DETECTED Final    Comment: Performed at [facility], [address] [place], [place] [address]  Culture, blood (Routine X 2) w Reflex to ID Panel     Status: None (Preliminary result)    Collection Time: 04/12/23  2:54 AM   Specimen: BLOOD RIGHT HAND  Result Value Ref Range Status   Specimen Description BLOOD RIGHT HAND  Final   Special Requests   Final    BOTTLES DRAWN AEROBIC ONLY Blood Culture adequate volume   Culture   Final    NO GROWTH 4 DAYS Performed at [facility], [address], [place], [place] [address]    Report Status PENDING  Incomplete  Culture, blood (Routine X 2) w Reflex to ID Panel     Status: None (Preliminary result)   Collection Time: 04/12/23  2:55 AM   Specimen: BLOOD RIGHT ARM  Result Value Ref Range Status   Specimen Description BLOOD RIGHT ARM  Final   Special Requests   Final    BOTTLES DRAWN AEROBIC ONLY Blood Culture adequate volume   Culture   Final    NO GROWTH 4 DAYS Performed at [facility], [address], [place], [place] [address]    Report Status PENDING  Incomplete   Imaging DG Foot Complete Left  Result Date: 04/12/2023 CLINICAL DATA:  Osteomyelitis.  Pain EXAM: LEFT FOOT - COMPLETE 3 VIEW COMPARISON:  None Available. FINDINGS: Moderate well corticated plantar and Achilles calcaneal spurs. Degenerative changes seen about the dorsal aspect of the midfoot. Degenerative changes also seen of the tibiotalar joint. There is severe osteopenia. Scattered soft tissue swelling. 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 No clear areas of bony erosive changes at this time. If there is further concern of bone infection, MRI or bone scan may be useful for further sensitivity. IMPRESSION: Osteopenia with soft tissue swelling and degenerative changes. Calcaneal spurs. 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   On: 04/12/2023 15:47   DG Swallowing Func-Speech Pathology  Result Date: 04/12/2023 Table  formatting from the original result was not included. Modified Barium Swallow Study Patient Details Name: Micheal Moore. MRN: [id number] Date of  Birth: [birth year] Today's Date: 04/12/2023 HPI/PMH: HPI: Pt is a 72 yo male presenting to ED 7/25 presenting with nausea, vomiting, and fever. Per MD note, pt's wife reports pt does not ambulate and is bedbound. She also reports that he lays flat in bed to eat/drink. CXR revealed central peribronchial wall thickening with some interstitial and patchy opacities in both lower lobes. PMH includes cancer, T2DM, dizziness with frequent falls, DVT, syncope Clinical Impression: Clinical Impression: Pt presents with an overall functional oropharyngeal swallow. He effectively achieved complete laryngeal vestibule closure during the swallow. Pharyngeal residue present throughout trials of thicker consistencies along base of tongue, valleculae, and pyriform sinuses. He effectively manages this residue and it is cleared with an intermittent cough, which he initiates independently. No penetration or aspiration noted throughout all trials, although may be at greater risk if deconditioned or if he does not maintain a fully upright position while eating and drinking. The pill was administered with straw sips of thin liquids with prompt and complete clearance during the esophageal sweep. Recommend continuing diet of regular textures with thin liquids only when pt is positioned fully upright. Given recent emesis, recommend staying fully upright ~30 minutes after eating and drinking. SLP will f/u x1 to provide education regarding aspiration precautions. Factors that may increase risk of adverse event in presence of aspiration Rubye Oaks & Clearance Coots 2021): Factors that may increase risk of adverse event in presence of aspiration Rubye Oaks & Clearance Coots 2021): Limited mobility; Frail or deconditioned Recommendations/Plan: Swallowing Evaluation Recommendations Swallowing Evaluation Recommendations Recommendations: PO diet PO Diet Recommendation: Regular; Thin liquids (Level 0) Liquid Administration via: Cup; Straw Medication Administration: Whole  meds with liquid Supervision: Patient able to self-feed Swallowing strategies  : Minimize environmental distractions; Slow rate Postural changes: Position pt fully upright for meals; Stay upright 30-60 min after meals Oral care recommendations: Oral care BID (2x/day) Treatment Plan Treatment Plan Treatment recommendations: Therapy as outlined in treatment plan below Follow-up recommendations: No SLP follow up Functional status assessment: Patient has not had a recent decline in their functional status. Treatment frequency: Min 1x/week Treatment duration: 1 week Interventions: Aspiration precaution training; Patient/family education; Diet toleration management by SLP Recommendations Recommendations for follow up therapy are one component of a multi-disciplinary discharge planning process, led by the attending physician.  Recommendations may be updated based on patient status, additional functional criteria and insurance authorization. Assessment: Orofacial Exam: Orofacial Exam Oral Cavity: Oral Hygiene: WFL Oral Cavity - Dentition: Poor condition; Missing dentition Orofacial Anatomy: WFL Oral Motor/Sensory Function: WFL Anatomy: Anatomy: Suspected cervical osteophytes Boluses Administered: Boluses Administered Boluses Administered: Thin liquids (Level 0); Mildly thick liquids (Level 2, nectar thick); Moderately thick liquids (Level 3, honey thick); Puree; Solid  Oral Impairment Domain: Oral Impairment Domain Lip Closure: No labial escape Tongue control during bolus hold: Cohesive bolus between tongue to palatal seal Bolus preparation/mastication: Timely and efficient chewing and mashing Bolus transport/lingual motion: Brisk tongue motion Oral residue: Complete oral clearance Location of oral residue : N/A Initiation of pharyngeal swallow : Valleculae  Pharyngeal Impairment Domain: Pharyngeal Impairment Domain Soft palate elevation: No bolus between soft palate (SP)/pharyngeal wall (PW) Laryngeal elevation: Complete  superior movement of thyroid cartilage with complete approximation of arytenoids to epiglottic petiole Anterior hyoid excursion: Complete anterior movement Epiglottic movement: Complete inversion Laryngeal vestibule closure: Complete, no air/contrast in laryngeal vestibule Pharyngeal stripping wave : Present -  complete Pharyngeal contraction (A/P view only): N/A Pharyngoesophageal segment opening: Complete distension and complete duration, no obstruction of flow Tongue base retraction: Trace column of contrast or air between tongue base and PPW Pharyngeal residue: Collection of residue within or on pharyngeal structures Location of pharyngeal residue: Valleculae; Tongue base; Pyriform sinuses  Esophageal Impairment Domain: Esophageal Impairment Domain Esophageal clearance upright position: Complete clearance, esophageal coating Pill: Pill Consistency administered: Thin liquids (Level 0) Thin liquids (Level 0): [facility] Penetration/Aspiration Scale Score: Penetration/Aspiration Scale Score 1.  Material does not enter airway: Thin liquids (Level 0); Mildly thick liquids (Level 2, nectar thick); Moderately thick liquids (Level 3, honey thick); Puree; Solid; Pill Compensatory Strategies: Compensatory Strategies Compensatory strategies: Yes Straw: Effective Effective Straw: Thin liquid (Level 0)   General Information: Caregiver present: No  Diet Prior to this Study: Regular; Thin liquids (Level 0)   Temperature : Normal   Respiratory Status: WFL   Supplemental O2: None (Room air)   History of Recent Intubation: No  Behavior/Cognition: Alert; Cooperative; Pleasant mood Self-Feeding Abilities: Able to self-feed Baseline vocal quality/speech: Normal Volitional Cough: Able to elicit Volitional Swallow: Able to elicit Exam Limitations: No limitations Goal Planning: Prognosis for improved oropharyngeal function: Good Barriers to Reach Goals: Time post onset No data recorded Patient/Family Stated Goal: none stated Consulted and  agree with results and recommendations: Patient Pain: Pain Assessment Pain Assessment: Faces Faces Pain Scale: 6 Pain Location: feet/toes when bumped/touched Pain Descriptors / Indicators: Guarding; Grimacing; Discomfort Pain Intervention(s): Monitored during session; Repositioned End of Session: Start Time:SLP Start Time (ACUTE ONLY): 1352 Stop Time: SLP Stop Time (ACUTE ONLY): 1411 Time Calculation:SLP Time Calculation (min) (ACUTE ONLY): 19 min Charges: SLP Evaluations $ SLP Speech Visit: 1 Visit SLP Evaluations $BSS Swallow: 1 Procedure $MBS Swallow: 1 Procedure SLP visit diagnosis: SLP Visit Diagnosis: Dysphagia, pharyngeal phase (R13.13) Past Medical History: Past Medical History: Diagnosis Date  Cancer (HCC)   right elbow melanoma  Diabetes mellitus without complication (HCC)   Type II  Dizziness   DVT (deep venous thrombosis) (HCC)   right leg  Frequent falls   Near syncope   Syncope  Past Surgical History: Past Surgical History: Procedure Laterality Date  AMPUTATION TOE Left 04/28/2020  Procedure: AMPUTATION LEFT GREAT TOE;  Surgeon: Nadara Mustard, MD;  Location: [facility];  Service: Orthopedics;  Laterality: Left;  CORONARY/GRAFT ACUTE MI REVASCULARIZATION N/A 01/15/2023  Procedure: Coronary/Graft Acute MI Revascularization;  Surgeon: Orbie Pyo, MD;  Location: [facility];  Service: Cardiovascular;  Laterality: N/A;  INTRAMEDULLARY (IM) NAIL INTERTROCHANTERIC Right 02/23/2022  Procedure: INTRAMEDULLARY (IM) NAIL INTERTROCHANTRIC, RIGHT;  Surgeon: Samson Frederic, MD;  Location: [facility];  Service: Orthopedics;  Laterality: Right;  LEFT HEART CATH AND CORONARY ANGIOGRAPHY N/A 01/15/2023  Procedure: LEFT HEART CATH AND CORONARY ANGIOGRAPHY;  Surgeon: Orbie Pyo, MD;  Location: [facility];  Service: Cardiovascular;  Laterality: N/A;  LOWER EXTREMITY ANGIOGRAPHY Bilateral 01/18/2023  Procedure: Lower Extremity Angiography;  Surgeon: Nada Libman, MD;  Location: [facility];  Service:  Cardiovascular;  Laterality: Bilateral;  MELANOMA EXCISION WITH SENTINEL LYMPH NODE BIOPSY Right 08/19/2020  Procedure: WIDE LOCAL EXCISION RIGHT ELBOW MELANOMA WITH RIGHT AXILLARY SENTINEL LYMPH NODE BIOPSY;  Surgeon: Fritzi Mandes, MD;  Location: [facility];  Service: General;  Laterality: Right;  2nd incision in axilla  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  Procedure: TOTAL HIP ARTHROPLASTY ANTERIOR APPROACH;  Surgeon: Samson Frederic, MD;  Location: [facility];  Service: Orthopedics;  Laterality:  Left;  TOTAL HIP ARTHROPLASTY Left 08/24/2022  Procedure: OPEN REDUCTION, HEAD AND LINER EXCHANGE;  Surgeon: Samson Frederic, MD;  Location: [facility];  Service: Orthopedics;  Laterality: Left; Gwynneth Aliment, M.A., CF-SLP Speech Language Pathology, Acute Rehabilitation Services Secure Chat preferred [phone] 04/12/2023, 2:57 PM   ECHOCARDIOGRAM LIMITED  Result Date: 04/12/2023    ECHOCARDIOGRAM LIMITED REPORT   Patient Name:   Micheal Moore. Date of Exam: 04/12/2023 Medical Rec #:  [id number]                 Height:       77.0 in Accession #:    [id number]                Weight:       192.5 lb Date of Birth:  07-Aug-1951                 BSA:          2.201 m Patient Age:    72 years                  BP:           129/74 mmHg Patient Gender: M                         HR:           76 bpm. Exam Location:  Inpatient Procedure: Limited Echo and Color Doppler Indications:    Bacteremia  History:        Patient has prior history of Echocardiogram examinations, most                 recent 01/16/2023. CAD and Previous Myocardial Infarction; Risk                 Factors:Dyslipidemia and Diabetes.  Sonographer:    Milbert Coulter Referring Phys: Belia Heman COMER IMPRESSIONS  1. No obvious vegetations, but valves not seen well enough to rule out. Would recommended TEE if clinically indicated.  2. Left ventricular ejection fraction, by estimation, is 65 to 70%. The left ventricle has normal function.  3. Right ventricular systolic  function is normal. The right ventricular size is normal.  4. Trivial mitral valve regurgitation.  5. Aortic valve regurgitation is not visualized.  6. The inferior vena cava is normal in size with greater than 50% respiratory variability, suggesting right atrial pressure of 3 mmHg. FINDINGS  Left Ventricle: Left ventricular ejection fraction, by estimation, is 65 to 70%. The left ventricle has normal function. The left ventricular internal cavity size was normal in size. There is no left ventricular hypertrophy. Right Ventricle: The right ventricular size is normal. Right vetricular wall thickness was not assessed. Right ventricular systolic function is normal. Left Atrium: Left atrial size was normal in size. Right Atrium: Right atrial size was normal in size. Pericardium: There is no evidence of pericardial effusion. Mitral Valve: There is mild thickening of the mitral valve leaflet(s). Trivial mitral valve regurgitation. Tricuspid Valve: The tricuspid valve is normal in structure. Tricuspid valve regurgitation is not demonstrated. Aortic Valve: Aortic valve regurgitation is not visualized. Pulmonic Valve: The pulmonic valve was not well visualized. Pulmonic valve regurgitation is not visualized. Aorta: The aortic root is normal in size and structure. Venous: The inferior vena cava is normal in size with greater than 50% respiratory variability, suggesting right atrial pressure of 3 mmHg. IAS/Shunts: No atrial level shunt detected by color flow  Doppler. Additional Comments: Color Doppler performed.  Dietrich Pates MD Electronically signed by Dietrich Pates MD Signature Date/Time: 04/12/2023/2:49:29 PM    Final    DG Chest Port 1 View  Result Date: 04/10/2023 CLINICAL DATA:  Cough and fever EXAM: PORTABLE CHEST 1 VIEW COMPARISON:  Chest x-ray 10/01/2022 FINDINGS: There is central peribronchial wall thickening with some interstitial and patchy opacities in both lower lungs. There is no focal lung consolidation, pleural  effusion or pneumothorax. Cardiomediastinal silhouette is within normal limits. IMPRESSION: Central peribronchial wall thickening with some interstitial and patchy opacities in both lower lungs. Findings are nonspecific and could be seen in the setting of atypical/viral infection. Electronically Signed   By: Darliss Cheney M.D.   On: 04/10/2023 23:23    Marcos Eke, NP [facility]  04/16/2023  11:37 AM

## 2023-04-16 NOTE — Inpatient Diabetes Management (Signed)
Inpatient Diabetes Program Recommendations  AACE/ADA: New Consensus Statement on Inpatient Glycemic Control (2015)  Target Ranges:  Prepandial:   less than 140 mg/dL      Peak postprandial:   less than 180 mg/dL (1-2 hours)      Critically ill patients:  140 - 180 mg/dL   Lab Results  Component Value Date   GLUCAP 195 (H) 04/16/2023   HGBA1C 13.3 (H) 01/15/2023    Review of Glycemic Control  Diabetes history: DM2 Outpatient Diabetes medications: Lantus 35 units at bedtime, Novolog 10 units TID (only take if eating meal and blood glucose > 80 mg/dL), Jardiance 10 mg QD Current orders for Inpatient glycemic control: Jardiance 10 mg every day, Semglee 25 at bedtime, Novolog 7 TID + 0-9 TID  HgbA1C - 13.3% CBGs today: 217, 205, 195, 115  Inpatient Diabetes Program Recommendations:    Agree with orders.  Spoke with pt at bedside regarding his diabetes, HgbA1C of 13.3%, and importance of controlling blood sugars to prevent further complications from diabetes. Pt states he does not miss his insulin, denies any hypoglycemia, and said he eats what he wants.   Not approp for diabetes education.  Follow glucose trends.   Thank you. Ailene Ards, RD, LDN, CDCES Inpatient Diabetes Coordinator [phone]

## 2023-04-16 NOTE — Progress Notes (Signed)
PROGRESS NOTE    Micheal Moore.  GEX:[id number] DOB: 1951-07-21 DOA: 04/10/2023 PCP: [name], MD    Brief Narrative:   Micheal Moore. is a 72 y.o. male with medical history significant of CAD s/p PCI, syncope, orthostatic hypotension, diabetes mellitus type 2, DVT of right leg, peripheral artery disease, right elbow melanoma s/p excision who presents with nausea and vomiting.  History is obtained from patient and with the assistance of his wife over the phone.  Patient states that he does not ambulate and is bedbound.  However, over the phone wife makes note that no one ever signed off to say that he cannot walk.  She reports that she takes care of him and cleans him, but does expect him to do certain things for himself such as giving himself meds as he can see.  She reports that he lays in a hospital bed and eat while laying flat when he knows he should be sitting up.  Also makes note he possibly does not always take his medications as he should despite her laying them out for him.  Last night patient developed acute onset of nausea and vomiting, but had been in his normal state of health prior to that as he had been seen by his primary care provider just 2 days ago.  He had not had any cough.  In route with EMS patient was noted to be febrile up to 100.5 F.  Found to have bacteremia- source unclear. TEE planned for Friday due to scheduling issues    Assessment and Plan: Suspected aspiration pneumonia Acute.  Patient presents with complaints of nausea and vomiting.  Chest x-ray noted central peribronchial wall thickening within some interstitial and patchy opacities in both lower lungs concerning for atypical/viral infection.  Influenza, COVID-19, and RSV screening were negative.  Procalcitonin was elevated at 0.4.  Based off history suspect possible aspiration pneumonia/bacterial pneumonia. -see below re: blood cultures -Incentive spirometry and flutter valve -see  below abx -Mucinex   Bacteremia -ID consult -Vancomycin + cefepime -Repeat blood cultures negative -TTE- no vegetations-- have placed consult for TEE-- will not happen until Thursday/Friday ? Wounds vs use of insulin needles over and over  Nausea and vomiting -resolved   Orthostatic hypotension Chronic.  -Continue midodrine and fludrocortisone   Elevated troponin CAD Acute.  High-sensitivity troponin 14->18.  Patient with recent inferior STEMI on 4/30 with stent placement..  Not on beta-blocker due to hypotension. -Continue Brilinta and aspirin   Hypokalemia -repleted   Uncontrolled diabetes mellitus type 2, with long-term use of insulin Last available hemoglobin A1c was 13.3 on  01/15/2023.  Patient reports taking Lantus 30-35 units nightly, NovoLog 8 to 10 units of 3 times daily with meals. -patient reports using pen needles over and over-educated about this   Normocytic anemia -trend   Peripheral vascular disease Patient with prior left abnormal ABI toe brachial index where he had aortogram with bilateral runoff with no intervention needed.  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 -Continue Brilinta and statin   Hyperlipidemia -Continue Crestor   History of DVT Patient previously had been on Eliquis, but was not currently taking and just on aspirin.   Pressure ulcer of the left heel -Wound care consult:  cleanse wounds to left heel and left lower leg with VASHE solution (LAWSON # [id number]) and pat dry. Apply VASHE moist gauze and cover with foam dressing. Change gauze daily and foam every other day.  Debility -PT/OT - no PT follow up Plan is home with wife once abx plan in place    DVT prophylaxis: enoxaparin (LOVENOX) injection 40 mg Start: 04/11/23 1000 SCDs Start: 04/11/23 0133    Code Status: Full Code Spoke with wife 7/29  Disposition Plan:  Level of care: Telemetry Medical Status is: Inpatient Remains inpatient  appropriate     Consultants:  ID Cards (TEE)   Subjective: Was accidentally made NPO this AM and is hungry Denies SOB  Objective: Vitals:   04/15/23 2022 04/16/23 0500 04/16/23 0504 04/16/23 1007  BP: 124/65  114/69 (!) 148/83  Pulse: 82  81 85  Resp: 20  17 17   Temp: 98.2 F (36.8 C)  98.2 F (36.8 C) 98 F (36.7 C)  TempSrc:      SpO2: 97%  98% 100%  Weight:  85 kg      Intake/Output Summary (Last 24 hours) at 04/16/2023 1236 Last data filed at 04/16/2023 0800 Gross per 24 hour  Intake 1450 ml  Output 2600 ml  Net -1150 ml   Filed Weights   04/14/23 0500 04/15/23 0500 04/16/23 0500  Weight: 88.8 kg 89.5 kg 85 kg    Examination:   General: Appearance:    Well developed, well nourished male in no acute distress     Lungs:     respirations unlabored  Heart:    Normal heart rate. .    MS:   Amputations on foot noted   Neurologic:   Awake, alert       Data Reviewed: I have personally reviewed following labs and imaging studies  CBC: Recent Labs  Lab 04/10/23 2255 04/11/23 0445 04/13/23 0323  WBC 9.0 8.9 5.0  NEUTROABS 7.6 7.2  --   HGB 12.7* 12.6* 11.1*  HCT 39.0 38.3* 34.0*  MCV 94.7 92.1 90.9  PLT 272 212 212   Basic Metabolic Panel: Recent Labs  Lab 04/10/23 2255 04/11/23 0236 04/12/23 0254 04/13/23 0323 04/16/23 0011  NA 133* 132* 132* 136 138  K 3.1* 3.2* 3.4* 3.9 3.8  CL 99 97* 95* 105 103  CO2 21* 21* 25 23 26   GLUCOSE 119* 140* 141* 156* 186*  BUN 18 20 29* 23 18  CREATININE 0.72 0.78 0.81 0.67 0.73  CALCIUM 8.7* 8.7* 8.8* 8.6* 8.9  MG  --  1.9  --   --   --   PHOS  --  3.8  --   --   --    GFR: Estimated Creatinine Clearance: 100.3 mL/min (by C-G formula based on SCr of 0.73 mg/dL). Liver Function Tests: Recent Labs  Lab 04/10/23 2255 04/11/23 0236  AST 14* 14*  ALT 12 12  ALKPHOS 76 74  BILITOT 1.4* 1.4*  PROT 7.0 6.7  ALBUMIN 3.3* 3.3*   Recent Labs  Lab 04/10/23 2255  LIPASE 23   No results for input(s):  "AMMONIA" in the last 168 hours. Coagulation Profile: No results for input(s): "INR", "PROTIME" in the last 168 hours. Cardiac Enzymes: No results for input(s): "CKTOTAL", "CKMB", "CKMBINDEX", "TROPONINI" in the last 168 hours. BNP (last 3 results) No results for input(s): "PROBNP" in the last 8760 hours. HbA1C: No results for input(s): "HGBA1C" in the last 72 hours. CBG: Recent Labs  Lab 04/15/23 1106 04/15/23 1635 04/15/23 2059 04/16/23 0714 04/16/23 1142  GLUCAP 152* 110* 210* 217* 205*   Lipid Profile: No results for input(s): "CHOL", "HDL", "LDLCALC", "TRIG", "CHOLHDL", "LDLDIRECT" in the last 72 hours. Thyroid Function Tests: No  results for input(s): "TSH", "T4TOTAL", "FREET4", "T3FREE", "THYROIDAB" in the last 72 hours. Anemia Panel: No results for input(s): "VITAMINB12", "FOLATE", "FERRITIN", "TIBC", "IRON", "RETICCTPCT" in the last 72 hours. Sepsis Labs: Recent Labs  Lab 04/10/23 2302 04/11/23 0140 04/11/23 0236  PROCALCITON  --   --  0.40  LATICACIDVEN 1.1 1.6  --     Recent Results (from the past 240 hour(s))  Blood culture (routine x 2)     Status: Abnormal   Collection Time: 04/10/23 10:55 PM   Specimen: BLOOD  Result Value Ref Range Status   Specimen Description BLOOD SITE NOT SPECIFIED  Final   Special Requests   Final    BOTTLES DRAWN AEROBIC AND ANAEROBIC Blood Culture results may not be optimal due to an excessive volume of blood received in culture bottles   Culture  Setup Time   Final    GRAM NEGATIVE RODS GRAM POSITIVE COCCI IN CLUSTERS IN BOTH AEROBIC AND ANAEROBIC BOTTLES CRITICAL RESULT CALLED TO, READ BACK BY AND VERIFIED WITHNorva Moore PHARMD, AT 1458 04/11/23 Micheal Moore Performed at [facility], [address], [place], [place] [address]    Culture (A)  Final    METHICILLIN RESISTANT STAPHYLOCOCCUS AUREUS PROVIDENCIA RETTGERI    Report Status 04/13/2023 FINAL  Final   Organism ID, Bacteria METHICILLIN RESISTANT STAPHYLOCOCCUS  AUREUS  Final   Organism ID, Bacteria PROVIDENCIA RETTGERI  Final      Susceptibility   Methicillin resistant staphylococcus aureus - MIC*    CIPROFLOXACIN >=8 RESISTANT Resistant     ERYTHROMYCIN >=8 RESISTANT Resistant     GENTAMICIN <=0.5 SENSITIVE Sensitive     OXACILLIN >=4 RESISTANT Resistant     TETRACYCLINE <=1 SENSITIVE Sensitive     VANCOMYCIN <=0.5 SENSITIVE Sensitive     TRIMETH/SULFA <=10 SENSITIVE Sensitive     CLINDAMYCIN RESISTANT Resistant     RIFAMPIN <=0.5 SENSITIVE Sensitive     Inducible Clindamycin POSITIVE Resistant     LINEZOLID 2 SENSITIVE Sensitive     * METHICILLIN RESISTANT STAPHYLOCOCCUS AUREUS   Providencia rettgeri - MIC*    AMPICILLIN RESISTANT Resistant     CEFEPIME <=0.12 SENSITIVE Sensitive     CEFTAZIDIME <=1 SENSITIVE Sensitive     CEFTRIAXONE <=0.25 SENSITIVE Sensitive     CIPROFLOXACIN <=0.25 SENSITIVE Sensitive     GENTAMICIN <=1 SENSITIVE Sensitive     IMIPENEM 1 SENSITIVE Sensitive     TRIMETH/SULFA <=20 SENSITIVE Sensitive     AMPICILLIN/SULBACTAM <=2 SENSITIVE Sensitive     PIP/TAZO <=4 SENSITIVE Sensitive     * PROVIDENCIA RETTGERI  Blood Culture ID Panel (Reflexed)     Status: Abnormal   Collection Time: 04/10/23 10:55 PM  Result Value Ref Range Status   Enterococcus faecalis NOT DETECTED NOT DETECTED Final   Enterococcus Faecium NOT DETECTED NOT DETECTED Final   Listeria monocytogenes NOT DETECTED NOT DETECTED Final   Staphylococcus species DETECTED (A) NOT DETECTED Final    Comment: CRITICAL RESULT CALLED TO, READ BACK BY AND VERIFIED WITH: Micheal Moore PHARMD, AT 1458 04/11/23 D. VANHOOK    Staphylococcus aureus (BCID) DETECTED (A) NOT DETECTED Final    Comment: Methicillin (oxacillin)-resistant Staphylococcus aureus (MRSA). MRSA is predictably resistant to beta-lactam antibiotics (except ceftaroline). Preferred therapy is vancomycin unless clinically contraindicated. Patient requires contact precautions if   hospitalized. CRITICAL RESULT CALLED TO, READ BACK BY AND VERIFIED WITH: Micheal Moore PHARMD, AT 1458 04/11/23 D. VANHOOK    Staphylococcus epidermidis NOT DETECTED NOT DETECTED Final  Staphylococcus lugdunensis NOT DETECTED NOT DETECTED Final   Streptococcus species NOT DETECTED NOT DETECTED Final   Streptococcus agalactiae NOT DETECTED NOT DETECTED Final   Streptococcus pneumoniae NOT DETECTED NOT DETECTED Final   Streptococcus pyogenes NOT DETECTED NOT DETECTED Final   A.calcoaceticus-baumannii NOT DETECTED NOT DETECTED Final   Bacteroides fragilis NOT DETECTED NOT DETECTED Final   Enterobacterales DETECTED (A) NOT DETECTED Final    Comment: Enterobacterales represent a large order of gram negative bacteria, not a single organism. Refer to culture for further identification. CRITICAL RESULT CALLED TO, READ BACK BY AND VERIFIED WITH: Micheal Moore PHARMD, AT 1458 04/11/23 D. VANHOOK    Enterobacter cloacae complex NOT DETECTED NOT DETECTED Final   Escherichia coli NOT DETECTED NOT DETECTED Final   Klebsiella aerogenes NOT DETECTED NOT DETECTED Final   Klebsiella oxytoca NOT DETECTED NOT DETECTED Final   Klebsiella pneumoniae NOT DETECTED NOT DETECTED Final   Proteus species NOT DETECTED NOT DETECTED Final   Salmonella species NOT DETECTED NOT DETECTED Final   Serratia marcescens NOT DETECTED NOT DETECTED Final   Haemophilus influenzae NOT DETECTED NOT DETECTED Final   Neisseria meningitidis NOT DETECTED NOT DETECTED Final   Pseudomonas aeruginosa NOT DETECTED NOT DETECTED Final   Stenotrophomonas maltophilia NOT DETECTED NOT DETECTED Final   Candida albicans NOT DETECTED NOT DETECTED Final   Candida auris NOT DETECTED NOT DETECTED Final   Candida glabrata NOT DETECTED NOT DETECTED Final   Candida krusei NOT DETECTED NOT DETECTED Final   Candida parapsilosis NOT DETECTED NOT DETECTED Final   Candida tropicalis NOT DETECTED NOT DETECTED Final   Cryptococcus neoformans/gattii NOT  DETECTED NOT DETECTED Final   CTX-M ESBL NOT DETECTED NOT DETECTED Final   Carbapenem resistance IMP NOT DETECTED NOT DETECTED Final   Carbapenem resistance KPC NOT DETECTED NOT DETECTED Final   Meth resistant mecA/C and MREJ DETECTED (A) NOT DETECTED Final    Comment: CRITICAL RESULT CALLED TO, READ BACK BY AND VERIFIED WITH: Micheal Moore PHARMD, AT 1458 04/11/23 D. VANHOOK    Carbapenem resistance NDM NOT DETECTED NOT DETECTED Final   Carbapenem resist OXA 48 LIKE NOT DETECTED NOT DETECTED Final   Carbapenem resistance VIM NOT DETECTED NOT DETECTED Final    Comment: Performed at [facility], [address] [place], [place] [address]  Blood culture (routine x 2)     Status: Abnormal   Collection Time: 04/10/23 11:09 PM   Specimen: BLOOD  Result Value Ref Range Status   Specimen Description BLOOD SITE NOT SPECIFIED  Final   Special Requests   Final    BOTTLES DRAWN AEROBIC AND ANAEROBIC Blood Culture results may not be optimal due to an excessive volume of blood received in culture bottles   Culture  Setup Time   Final    GRAM POSITIVE COCCI IN CLUSTERS IN BOTH AEROBIC AND ANAEROBIC BOTTLES GRAM NEGATIVE RODS AEROBIC BOTTLE ONLY    Culture (A)  Final    STAPHYLOCOCCUS AUREUS PROVIDENCIA RETTGERI SUSCEPTIBILITIES PERFORMED ON PREVIOUS CULTURE WITHIN THE LAST 5 DAYS. Performed at [facility], [address], [place], [place] [address]    Report Status 04/13/2023 FINAL  Final  Resp panel by RT-PCR (RSV, Flu A&B, Covid) Anterior Nasal Swab     Status: None   Collection Time: 04/10/23 11:47 PM   Specimen: Anterior Nasal Swab  Result Value Ref Range Status   SARS Coronavirus 2 by RT PCR NEGATIVE NEGATIVE Final   Influenza A by PCR NEGATIVE NEGATIVE Final  Influenza B by PCR NEGATIVE NEGATIVE Final    Comment: (NOTE) The Xpert Xpress SARS-CoV-2/FLU/RSV plus assay is intended as an aid in the diagnosis of influenza from Nasopharyngeal swab specimens and should not be  used as a sole basis for treatment. Nasal washings and aspirates are unacceptable for Xpert Xpress SARS-CoV-2/FLU/RSV testing.  Fact Sheet for Patients: BloggerCourse.com  Fact Sheet for Healthcare Providers: SeriousBroker.it  This test is not yet approved or cleared by the [place] FDA and has been authorized for detection and/or diagnosis of SARS-CoV-2 by FDA under an Emergency Use Authorization (EUA). This EUA will remain in effect (meaning this test can be used) for the duration of the COVID-19 declaration under Section 564(b)(1) of the Act, 21 U.S.C. section 360bbb-3(b)(1), unless the authorization is terminated or revoked.     Resp Syncytial Virus by PCR NEGATIVE NEGATIVE Final    Comment: (NOTE) Fact Sheet for Patients: BloggerCourse.com  Fact Sheet for Healthcare Providers: SeriousBroker.it  This test is not yet approved or cleared by the [place] FDA and has been authorized for detection and/or diagnosis of SARS-CoV-2 by FDA under an Emergency Use Authorization (EUA). This EUA will remain in effect (meaning this test can be used) for the duration of the COVID-19 declaration under Section 564(b)(1) of the Act, 21 U.S.C. section 360bbb-3(b)(1), unless the authorization is terminated or revoked.  Performed at [facility], [address] [place], [place] [address]   Respiratory (~20 pathogens) panel by PCR     Status: None   Collection Time: 04/11/23  8:20 AM   Specimen: Nasopharyngeal Swab; Respiratory  Result Value Ref Range Status   Adenovirus NOT DETECTED NOT DETECTED Final   Coronavirus 229E NOT DETECTED NOT DETECTED Final    Comment: (NOTE) The Coronavirus on the Respiratory Panel, DOES NOT test for the novel  Coronavirus (2019 nCoV)    Coronavirus HKU1 NOT DETECTED NOT DETECTED Final   Coronavirus NL63 NOT DETECTED NOT DETECTED Final    Coronavirus OC43 NOT DETECTED NOT DETECTED Final   Metapneumovirus NOT DETECTED NOT DETECTED Final   Rhinovirus / Enterovirus NOT DETECTED NOT DETECTED Final   Influenza A NOT DETECTED NOT DETECTED Final   Influenza B NOT DETECTED NOT DETECTED Final   Parainfluenza Virus 1 NOT DETECTED NOT DETECTED Final   Parainfluenza Virus 2 NOT DETECTED NOT DETECTED Final   Parainfluenza Virus 3 NOT DETECTED NOT DETECTED Final   Parainfluenza Virus 4 NOT DETECTED NOT DETECTED Final   Respiratory Syncytial Virus NOT DETECTED NOT DETECTED Final   Bordetella pertussis NOT DETECTED NOT DETECTED Final   Bordetella Parapertussis NOT DETECTED NOT DETECTED Final   Chlamydophila pneumoniae NOT DETECTED NOT DETECTED Final   Mycoplasma pneumoniae NOT DETECTED NOT DETECTED Final    Comment: Performed at [facility], [address] [place], [place] [address]  Culture, blood (Routine X 2) w Reflex to ID Panel     Status: None (Preliminary result)   Collection Time: 04/12/23  2:54 AM   Specimen: BLOOD RIGHT HAND  Result Value Ref Range Status   Specimen Description BLOOD RIGHT HAND  Final   Special Requests   Final    BOTTLES DRAWN AEROBIC ONLY Blood Culture adequate volume   Culture   Final    NO GROWTH 4 DAYS Performed at [facility], [address], [place], [place] [address]    Report Status PENDING  Incomplete  Culture, blood (Routine X 2) w Reflex to ID Panel  Status: None (Preliminary result)   Collection Time: 04/12/23  2:55 AM   Specimen: BLOOD RIGHT ARM  Result Value Ref Range Status   Specimen Description BLOOD RIGHT ARM  Final   Special Requests   Final    BOTTLES DRAWN AEROBIC ONLY Blood Culture adequate volume   Culture   Final    NO GROWTH 4 DAYS Performed at [facility], [address] [place], [place] [address]    Report Status PENDING  Incomplete         Radiology Studies: No results found.      Scheduled Meds:  aspirin EC  81 mg Oral Daily    empagliflozin  10 mg Oral Daily   enoxaparin (LOVENOX) injection  40 mg Subcutaneous Q24H   feeding supplement (GLUCERNA SHAKE)  237 mL Oral TID BM   fludrocortisone  0.2 mg Oral Daily   guaiFENesin  600 mg Oral BID   insulin aspart  0-9 Units Subcutaneous TID WC   insulin aspart  7 Units Subcutaneous TID WC   insulin glargine-yfgn  25 Units Subcutaneous QHS   midodrine  7.5 mg Oral TID WC   pantoprazole  40 mg Oral Daily   rosuvastatin  5 mg Oral Once per day on Monday Thursday   ticagrelor  90 mg Oral BID   Continuous Infusions:  ceFEPime (MAXIPIME) IV 2 g (04/16/23 0836)   vancomycin 1,250 mg (04/16/23 0609)     LOS: 5 days    Time spent: 4 minutes spent on chart review, discussion with nursing staff, consultants, updating family and interview/physical exam; more than 50% of that time was spent in counseling and/or coordination of care.    Joseph Art, DO [facility] Available via Epic secure chat 7am-7pm After these hours, please refer to coverage provider listed on [email or web address] 04/16/2023, 12:36 PM

## 2023-04-16 NOTE — Progress Notes (Signed)
Pharmacy Antibiotic Note  Micheal Moore. is a 72 y.o. male admitted on 04/10/2023 with pneumonia. Patient received azithromycin + ceftriaxone in the ED. 7/24 blood cultures positive for MRSA and Providencia. Repeat Bcx on 7/26 are NG x 4 days. Renal function normal (CrCl 100.8 ml/min, SCr 0.66). Vancomycin peak 39, trough 20, both drawn appropriately. Steady state AUC 715 (supratherapeutic). Pharmacy has been consulted for vancomycin dosing.  Plan: Decrease vancomycin dose to 750mg  IV Q12H (predicted AUC 428, predicted Cmin 11.7 with goal > 10, goal AUC 400-550) Repeat levels at new steady state as vanc remains on board. Plan for TEE on 04/19/2023. Continue to monitor renal function.   Weight: 85 kg (187 lb 6.3 oz)  Temp (24hrs), Avg:98.2 F (36.8 C), Min:98 F (36.7 C), Max:98.2 F (36.8 C)  Recent Labs  Lab 04/10/23 2255 04/10/23 2302 04/11/23 0140 04/11/23 0236 04/11/23 0445 04/12/23 0254 04/13/23 0323 04/16/23 0011 04/16/23 0844 04/16/23 1801  WBC 9.0  --   --   --  8.9  --  5.0  --   --   --   CREATININE 0.72  --   --  0.78  --  0.81 0.67 0.73  --   --   LATICACIDVEN  --  1.1 1.6  --   --   --   --   --   --   --   VANCOTROUGH  --   --   --   --   --   --   --   --   --  20  VANCOPEAK  --   --   --   --   --   --   --   --  39  --     Estimated Creatinine Clearance: 100.3 mL/min (by C-G formula based on SCr of 0.73 mg/dL).    No Known Allergies  Antimicrobials this admission: Azithromycin 7/25 x1  Ceftriaxone 7/25 x1 Cefepime 7/25 >>  Vancomycin 7/25 >>   Microbiology results: 7/24 BCx: MRSA, Enterobacteriales  7/26 BCx: NGTD  Lendon Ka, PharmD Candidate 04/16/2023 7:43 PM

## 2023-04-16 NOTE — Progress Notes (Signed)
Pharmacy Antibiotic Note  Micheal Moore. is a 72 y.o. male admitted on 04/10/2023 with pneumonia. Patient received azithromycin + ceftriaxone in the ED. 7/24 blood cultures positive for MRSA and unidentified Enterobacterales species. Renal function normal (CrCl 100.3 ml/min, SCr 0.73). Pharmacy has been consulted for vancomycin dosing.  7/30: Vancomycin peak of 39 and trough of 20 today, calculated AUC 715 (supratherapeutic). Will adjust vancomycin dosing.   Plan: Starting at 1200 on 7/31, start vancomycin 1500mg  IV Q24H (eAUC 428, scr 0.73) Plan to check level at steady state  Weight: 85 kg (187 lb 6.3 oz)  Temp (24hrs), Avg:98.2 F (36.8 C), Min:98 F (36.7 C), Max:98.2 F (36.8 C)  Recent Labs  Lab 04/10/23 2255 04/10/23 2302 04/11/23 0140 04/11/23 0236 04/11/23 0445 04/12/23 0254 04/13/23 0323 04/16/23 0011 04/16/23 0844 04/16/23 1801  WBC 9.0  --   --   --  8.9  --  5.0  --   --   --   CREATININE 0.72  --   --  0.78  --  0.81 0.67 0.73  --   --   LATICACIDVEN  --  1.1 1.6  --   --   --   --   --   --   --   VANCOTROUGH  --   --   --   --   --   --   --   --   --  20  VANCOPEAK  --   --   --   --   --   --   --   --  39  --     Estimated Creatinine Clearance: 100.3 mL/min (by C-G formula based on SCr of 0.73 mg/dL).    No Known Allergies  Antimicrobials this admission: Azithromycin 7/25 x1  Ceftriaxone 7/25 x1 Cefepime 2g IV Q8H 7/25 >>  Vancomycin 7/25 >>    Microbiology results: 7/24 BCx: MRSA, Enterobacteriales, Providencia   Enos Fling, PharmD PGY-1 Acute Care Pharmacy Resident 04/16/2023 8:04 PM

## 2023-04-16 NOTE — Progress Notes (Addendum)
    Informed Consent   Shared Decision Making/Informed Consent  The risks [esophageal damage, perforation (1:10,000 risk), bleeding, pharyngeal hematoma as well as other potential complications associated with conscious sedation including aspiration, arrhythmia, respiratory failure and death], benefits (treatment guidance and diagnostic support) and alternatives of a transesophageal echocardiogram were discussed in detail with Mr. Micheal Moore and he is willing to proceed.   TEE scheduled for Friday 04/19/23, NPO after midnight on 04/19/23, orders placed. On Jardiance.

## 2023-04-17 ENCOUNTER — Encounter: Payer: Medicare HMO | Attending: Internal Medicine | Admitting: Dietician

## 2023-04-17 DIAGNOSIS — R7881 Bacteremia: Secondary | ICD-10-CM | POA: Diagnosis not present

## 2023-04-17 LAB — GLUCOSE, CAPILLARY
Glucose-Capillary: 161 mg/dL — ABNORMAL HIGH (ref 70–99)
Glucose-Capillary: 103 mg/dL — ABNORMAL HIGH (ref 70–99)
Glucose-Capillary: 124 mg/dL — ABNORMAL HIGH (ref 70–99)
Glucose-Capillary: 110 mg/dL — ABNORMAL HIGH (ref 70–99)

## 2023-04-17 MED ORDER — VANCOMYCIN HCL 750 MG/150ML IV SOLN
750.0000 mg | Freq: Two times a day (BID) | INTRAVENOUS | Status: DC
  Administered 2023-04-17 – 2023-04-19 (×5): 750 mg via INTRAVENOUS
  Filled 2023-04-17 (×7): qty 150

## 2023-04-17 MED ORDER — ACETAMINOPHEN 325 MG PO TABS: 650.0000 mg | ORAL_TABLET | Freq: Four times a day (QID) | ORAL | Status: DC | PRN

## 2023-04-17 MED ORDER — GUAIFENESIN ER 600 MG PO TB12: 600.0000 mg | ORAL_TABLET | Freq: Two times a day (BID) | ORAL | Status: DC | PRN

## 2023-04-17 NOTE — Progress Notes (Signed)
Micheal Moore.  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  At baseline the patient is reportedly dependent upon significant assistance in his care, but frequently does not take his medications, and is not ambulatory.  Following his admission the patient was found to be suffering with bacteremia with no clear source thus far having been elucidated.  Goals of Care:   Code Status: Full Code   DVT prophylaxis: enoxaparin (LOVENOX) injection 40 mg Start: 04/11/23 1000 SCDs Start: 04/11/23 0133  Interim Hx: Afebrile.  Vital signs stable.  Resting comfortably in bed.  Has no new complaints today.  Assessment & Plan:  Polymicrobial bacteremia -MRSA and Providencia Continue empiric antibiotic as per ID recommendations -TTE revealed no vegetations -presently awaiting TEE  Suspected acute aspiration pneumonia Perhaps in the setting of the patient's acute nausea and vomiting combined with his bedridden status he suffered an episode of aspiration leading to his bacteremia -is more than adequately covered with his current antibiotic regimen  Acute nausea and vomiting Resolved  Chronic orthostatic hypotension Continue usual midodrine and fludrocortisone  History of CAD Status post STEMI with stent placement 01/15/2023 -no beta-blocker due to orthostatic hypotension -continue Brilinta and aspirin  Uncontrolled DM2 Patient admitted to using pen needles over and over and has been educated that this is not appropriate -A1c 13.19 December 2022  Normocytic anemia  PVD   Family Communication: No family present at time of exam Disposition: Need results of TEE prior to  being able to make plans for discharge   Objective: Blood pressure 131/67, pulse 90, temperature 98.5 F (36.9 C), temperature source Oral, resp. rate 16, weight 85.4 kg, SpO2 98%.  Intake/Output Summary (Last 24 hours) at 04/17/2023 1024 Last data filed at 04/17/2023 0449 Gross per 24 hour  Intake 1317 ml  Output 2650 ml  Net -1333 ml   Filed Weights   04/15/23 0500 04/16/23 0500 04/17/23 0448  Weight: 89.5 kg 85 kg 85.4 kg    Examination: General: No acute respiratory distress Lungs: Clear to auscultation bilaterally without wheezes or crackles Cardiovascular: Regular rate and rhythm without murmur gallop or rub normal S1 and S2 Abdomen: Nontender, nondistended, soft, bowel sounds positive, no rebound, no ascites, no appreciable mass Extremities: No significant cyanosis, clubbing, or edema bilateral lower extremities  CBC: Recent Labs  Lab 04/10/23 2255 04/11/23 0445 04/13/23 0323  WBC 9.0 8.9 5.0  NEUTROABS 7.6 7.2  --   HGB 12.7* 12.6* 11.1*  HCT 39.0 38.3* 34.0*  MCV 94.7 92.1 90.9  PLT 272 212 212   Basic Metabolic Panel: Recent Labs  Lab 04/11/23 0236 04/12/23 0254 04/13/23 0323 04/16/23 0011 04/17/23 0434  NA 132*   < > 136 138 135  K 3.2*   < > 3.9 3.8 4.2  CL 97*   < > 105 103 98  CO2 21*   < > 23 26 27   GLUCOSE 140*   < > 156* 186* 212*  BUN 20   < > 23 18 18   CREATININE 0.78   < > 0.67 0.73 0.66  CALCIUM 8.7*   < > 8.6* 8.9 9.0  MG 1.9  --   --   --   --  PHOS 3.8  --   --   --   --    < > = values in this interval not displayed.   GFR: Estimated Creatinine Clearance: 100.8 mL/min (by C-G formula based on SCr of 0.66 mg/dL).   Scheduled Meds:  aspirin EC  81 mg Oral Daily   empagliflozin  10 mg Oral Daily   enoxaparin (LOVENOX) injection  40 mg Subcutaneous Q24H   feeding supplement (GLUCERNA SHAKE)  237 mL Oral TID BM   fludrocortisone  0.2 mg Oral Daily   guaiFENesin  600 mg Oral BID   insulin aspart  0-9 Units Subcutaneous TID WC    insulin aspart  7 Units Subcutaneous TID WC   insulin glargine-yfgn  25 Units Subcutaneous QHS   midodrine  7.5 mg Oral TID WC   pantoprazole  40 mg Oral Daily   rosuvastatin  5 mg Oral Once per day on Monday Thursday   ticagrelor  90 mg Oral BID   Continuous Infusions:  ceFEPime (MAXIPIME) IV 2 g (04/17/23 0750)   vancomycin 750 mg (04/17/23 1021)     LOS: 6 days   Lonia Blood, MD [facility] Office  [phone] Pager - Text Page per Loretha Stapler  If 7PM-7AM, please contact night-coverage per Amion 04/17/2023, 10:24 AM

## 2023-04-17 NOTE — Plan of Care (Signed)
  Problem: Education: Goal: Knowledge of General Education information will improve Description: Including pain rating scale, medication(s)/side effects and non-pharmacologic comfort measures Outcome: Progressing   Problem: Health Behavior/Discharge Planning: Goal: Ability to manage health-related needs will improve Outcome: Progressing   Problem: Clinical Measurements: Goal: Ability to maintain clinical measurements within normal limits will improve Outcome: Progressing   Problem: Activity: Goal: Risk for activity intolerance will decrease Outcome: Progressing   Problem: Nutrition: Goal: Adequate nutrition will be maintained Outcome: Progressing   Problem: Coping: Goal: Level of anxiety will decrease Outcome: Progressing   Problem: Elimination: Goal: Will not experience complications related to bowel motility Outcome: Progressing   Problem: Pain Managment: Goal: General experience of comfort will improve Outcome: Progressing   Problem: Safety: Goal: Ability to remain free from injury will improve Outcome: Progressing   Problem: Skin Integrity: Goal: Risk for impaired skin integrity will decrease Outcome: Progressing   Problem: Clinical Measurements: Goal: Ability to maintain a body temperature in the normal range will improve Outcome: Progressing   Problem: Respiratory: Goal: Ability to maintain adequate ventilation will improve Outcome: Progressing   Problem: Education: Goal: Ability to describe self-care measures that may prevent or decrease complications (Diabetes Survival Skills Education) will improve Outcome: Progressing   Problem: Coping: Goal: Ability to adjust to condition or change in health will improve Outcome: Progressing   Problem: Fluid Volume: Goal: Ability to maintain a balanced intake and output will improve Outcome: Progressing   Problem: Health Behavior/Discharge Planning: Goal: Ability to identify and utilize available resources and  services will improve Outcome: Progressing   Problem: Nutritional: Goal: Maintenance of adequate nutrition will improve Outcome: Progressing   Problem: Tissue Perfusion: Goal: Adequacy of tissue perfusion will improve Outcome: Progressing   Problem: Activity: Goal: Ability to tolerate increased activity will improve Outcome: Not Progressing Note: Patient not attempting to mobilize

## 2023-04-18 DIAGNOSIS — J690 Pneumonitis due to inhalation of food and vomit: Secondary | ICD-10-CM | POA: Diagnosis not present

## 2023-04-18 LAB — GLUCOSE, CAPILLARY
Glucose-Capillary: 111 mg/dL — ABNORMAL HIGH (ref 70–99)
Glucose-Capillary: 114 mg/dL — ABNORMAL HIGH (ref 70–99)
Glucose-Capillary: 123 mg/dL — ABNORMAL HIGH (ref 70–99)
Glucose-Capillary: 125 mg/dL — ABNORMAL HIGH (ref 70–99)
Glucose-Capillary: 135 mg/dL — ABNORMAL HIGH (ref 70–99)
Glucose-Capillary: 78 mg/dL (ref 70–99)

## 2023-04-18 MED ORDER — DEXTROSE 50 % IV SOLN
12.5000 g | INTRAVENOUS | Status: AC
  Administered 2023-04-18: 12.5 g via INTRAVENOUS

## 2023-04-18 MED ORDER — INSULIN ASPART 100 UNIT/ML IJ SOLN
0.0000 [IU] | INTRAMUSCULAR | Status: DC
  Administered 2023-04-19 – 2023-04-20 (×5): 2 [IU] via SUBCUTANEOUS
  Administered 2023-04-20: 1 [IU] via SUBCUTANEOUS
  Administered 2023-04-20 (×2): 2 [IU] via SUBCUTANEOUS
  Administered 2023-04-21: 1 [IU] via SUBCUTANEOUS
  Administered 2023-04-21: 2 [IU] via SUBCUTANEOUS
  Administered 2023-04-21: 3 [IU] via SUBCUTANEOUS
  Administered 2023-04-21 (×2): 2 [IU] via SUBCUTANEOUS
  Administered 2023-04-21: 5 [IU] via SUBCUTANEOUS
  Administered 2023-04-22 (×4): 2 [IU] via SUBCUTANEOUS

## 2023-04-18 MED ORDER — SODIUM CHLORIDE 0.9 % IV SOLN: 12.5000 mg | Freq: Four times a day (QID) | INTRAVENOUS | Status: DC | PRN

## 2023-04-18 NOTE — Progress Notes (Signed)
[facility]  Date of Admission:  04/10/2023     Total days of antibiotics 9         ASSESSMENT:  Micheal Moore blood cultures from 7/26 have finalized without growth confirming clearance of bacteremia and awaiting TEE to rule out endocarditis which is scheduled for 04/19/23. Renal function remains stable and most recent vancomycin trough is therapeutic at 20. Continue wound care per Wound RN recommendations and optimize protein intake. Continue current dose of vancomycin and ceftriaxone with final recommendations pending TEE results. Remaining medical and supportive care per Internal Medicine.   PLAN:  Continue current dose of vancomycin and cefepime. Await TEE on 8/2 for final recommendations.  Wound care per Wound RN Therapeutic drug monitoring of renal function and vancomycin levels. Remaining medical and supportive care per Internal Medicine.    I have personally spent 20 minutes involved in face-to-face and non-face-to-face activities for this patient on the day of the visit. Professional time spent includes the following activities: Preparing to see the patient (review of tests), Obtaining and/or reviewing separately obtained history (admission/discharge record), Performing a medically appropriate examination and/or evaluation , Ordering medications/tests/procedures, referring and communicating with other health care professionals, Documenting clinical information in the EMR, Independently interpreting results (not separately reported), Communicating results to the patient/family/caregiver, Counseling and educating the patient/family/caregiver and Care coordination (not separately reported).   Principal Problem:   Aspiration pneumonia (HCC) Active Problems:   Type 2 diabetes mellitus with hyperlipidemia (HCC)   History of DVT (deep vein thrombosis)   Hyperlipidemia   Orthostatic hypotension   Normocytic anemia   Pressure injury of skin   Nausea  and vomiting   Elevated troponin   CAD (coronary artery disease)   Hypokalemia   PVD (peripheral vascular disease) (HCC)   Debility   Bacteremia   Medication management    aspirin EC  81 mg Oral Daily   empagliflozin  10 mg Oral Daily   enoxaparin (LOVENOX) injection  40 mg Subcutaneous Q24H   feeding supplement (GLUCERNA SHAKE)  237 mL Oral TID BM   fludrocortisone  0.2 mg Oral Daily   insulin aspart  0-9 Units Subcutaneous TID WC   insulin aspart  7 Units Subcutaneous TID WC   insulin glargine-yfgn  25 Units Subcutaneous QHS   midodrine  7.5 mg Oral TID WC   pantoprazole  40 mg Oral Daily   rosuvastatin  5 mg Oral Once per day on Monday Thursday   ticagrelor  90 mg Oral BID    SUBJECTIVE:  Afebrile overnight with no new concerns/complaints. No acute events.   No Known Allergies   Review of Systems: Review of Systems  Constitutional:  Negative for chills, fever and weight loss.  Respiratory:  Negative for cough, shortness of breath and wheezing.   Cardiovascular:  Negative for chest pain and leg swelling.  Gastrointestinal:  Negative for abdominal pain, constipation, diarrhea, nausea and vomiting.  Skin:  Negative for rash.      OBJECTIVE: Vitals:   04/17/23 1615 04/17/23 2150 04/18/23 0441 04/18/23 0905  BP: (!) 142/73 (!) 165/72 (!) 141/86 116/68  Pulse: 86 87 86 84  Resp: 19 19 19 19   Temp: 98.4 F (36.9 C) 98.3 F (36.8 C) 98.2 F (36.8 C) 98.1 F (36.7 C)  TempSrc: Oral   Oral  SpO2: 98% 100% 98% 99%  Weight:       Body mass index is 22.33 kg/m.  Physical Exam Constitutional:  General: He is not in acute distress.    Appearance: He is well-developed.  Cardiovascular:     Rate and Rhythm: Normal rate and regular rhythm.     Heart sounds: Normal heart sounds.  Pulmonary:     Effort: Pulmonary effort is normal.     Breath sounds: Normal breath sounds.  Skin:    General: Skin is warm and dry.  Neurological:     Mental Status: He is alert.   Psychiatric:        Thought Content: Thought content normal.     Lab Results Lab Results  Component Value Date   WBC 8.4 04/18/2023   HGB 11.8 (L) 04/18/2023   HCT 37.1 (L) 04/18/2023   MCV 95.9 04/18/2023   PLT 317 04/18/2023    Lab Results  Component Value Date   CREATININE 0.60 (L) 04/18/2023   BUN 16 04/18/2023   NA 135 04/18/2023   K 4.0 04/18/2023   CL 99 04/18/2023   CO2 28 04/18/2023    Lab Results  Component Value Date   ALT 17 04/18/2023   AST 16 04/18/2023   ALKPHOS 59 04/18/2023   BILITOT 0.5 04/18/2023     Microbiology: Recent Results (from the past 240 hour(s))  Blood culture (routine x 2)     Status: Abnormal   Collection Time: 04/10/23 10:55 PM   Specimen: BLOOD  Result Value Ref Range Status   Specimen Description BLOOD SITE NOT SPECIFIED  Final   Special Requests   Final    BOTTLES DRAWN AEROBIC AND ANAEROBIC Blood Culture results may not be optimal due to an excessive volume of blood received in culture bottles   Culture  Setup Time   Final    GRAM NEGATIVE RODS GRAM POSITIVE COCCI IN CLUSTERS IN BOTH AEROBIC AND ANAEROBIC BOTTLES CRITICAL RESULT CALLED TO, READ BACK BY AND VERIFIED WITHNorva Riffle PHARMD, AT 1458 04/11/23 Renato Shin Performed at [facility], [address], [place], [place] [address]    Culture (A)  Final    METHICILLIN RESISTANT STAPHYLOCOCCUS AUREUS PROVIDENCIA RETTGERI    Report Status 04/13/2023 FINAL  Final   Organism ID, Bacteria METHICILLIN RESISTANT STAPHYLOCOCCUS AUREUS  Final   Organism ID, Bacteria PROVIDENCIA RETTGERI  Final      Susceptibility   Methicillin resistant staphylococcus aureus - MIC*    CIPROFLOXACIN >=8 RESISTANT Resistant     ERYTHROMYCIN >=8 RESISTANT Resistant     GENTAMICIN <=0.5 SENSITIVE Sensitive     OXACILLIN >=4 RESISTANT Resistant     TETRACYCLINE <=1 SENSITIVE Sensitive     VANCOMYCIN <=0.5 SENSITIVE Sensitive     TRIMETH/SULFA <=10 SENSITIVE Sensitive     CLINDAMYCIN  RESISTANT Resistant     RIFAMPIN <=0.5 SENSITIVE Sensitive     Inducible Clindamycin POSITIVE Resistant     LINEZOLID 2 SENSITIVE Sensitive     * METHICILLIN RESISTANT STAPHYLOCOCCUS AUREUS   Providencia rettgeri - MIC*    AMPICILLIN RESISTANT Resistant     CEFEPIME <=0.12 SENSITIVE Sensitive     CEFTAZIDIME <=1 SENSITIVE Sensitive     CEFTRIAXONE <=0.25 SENSITIVE Sensitive     CIPROFLOXACIN <=0.25 SENSITIVE Sensitive     GENTAMICIN <=1 SENSITIVE Sensitive     IMIPENEM 1 SENSITIVE Sensitive     TRIMETH/SULFA <=20 SENSITIVE Sensitive     AMPICILLIN/SULBACTAM <=2 SENSITIVE Sensitive     PIP/TAZO <=4 SENSITIVE Sensitive     * PROVIDENCIA RETTGERI  Blood Culture ID Panel (Reflexed)     Status:  Abnormal   Collection Time: 04/10/23 10:55 PM  Result Value Ref Range Status   Enterococcus faecalis NOT DETECTED NOT DETECTED Final   Enterococcus Faecium NOT DETECTED NOT DETECTED Final   Listeria monocytogenes NOT DETECTED NOT DETECTED Final   Staphylococcus species DETECTED (A) NOT DETECTED Final    Comment: CRITICAL RESULT CALLED TO, READ BACK BY AND VERIFIED WITH: E. SINCLAIR PHARMD, AT 1458 04/11/23 D. VANHOOK    Staphylococcus aureus (BCID) DETECTED (A) NOT DETECTED Final    Comment: Methicillin (oxacillin)-resistant Staphylococcus aureus (MRSA). MRSA is predictably resistant to beta-lactam antibiotics (except ceftaroline). Preferred therapy is vancomycin unless clinically contraindicated. Patient requires contact precautions if  hospitalized. CRITICAL RESULT CALLED TO, READ BACK BY AND VERIFIED WITH: E. SINCLAIR PHARMD, AT 1458 04/11/23 D. VANHOOK    Staphylococcus epidermidis NOT DETECTED NOT DETECTED Final   Staphylococcus lugdunensis NOT DETECTED NOT DETECTED Final   Streptococcus species NOT DETECTED NOT DETECTED Final   Streptococcus agalactiae NOT DETECTED NOT DETECTED Final   Streptococcus pneumoniae NOT DETECTED NOT DETECTED Final   Streptococcus pyogenes NOT DETECTED NOT  DETECTED Final   A.calcoaceticus-baumannii NOT DETECTED NOT DETECTED Final   Bacteroides fragilis NOT DETECTED NOT DETECTED Final   Enterobacterales DETECTED (A) NOT DETECTED Final    Comment: Enterobacterales represent a large order of gram negative bacteria, not a single organism. Refer to culture for further identification. CRITICAL RESULT CALLED TO, READ BACK BY AND VERIFIED WITH: E. SINCLAIR PHARMD, AT 1458 04/11/23 D. VANHOOK    Enterobacter cloacae complex NOT DETECTED NOT DETECTED Final   Escherichia coli NOT DETECTED NOT DETECTED Final   Klebsiella aerogenes NOT DETECTED NOT DETECTED Final   Klebsiella oxytoca NOT DETECTED NOT DETECTED Final   Klebsiella pneumoniae NOT DETECTED NOT DETECTED Final   Proteus species NOT DETECTED NOT DETECTED Final   Salmonella species NOT DETECTED NOT DETECTED Final   Serratia marcescens NOT DETECTED NOT DETECTED Final   Haemophilus influenzae NOT DETECTED NOT DETECTED Final   Neisseria meningitidis NOT DETECTED NOT DETECTED Final   Pseudomonas aeruginosa NOT DETECTED NOT DETECTED Final   Stenotrophomonas maltophilia NOT DETECTED NOT DETECTED Final   Candida albicans NOT DETECTED NOT DETECTED Final   Candida auris NOT DETECTED NOT DETECTED Final   Candida glabrata NOT DETECTED NOT DETECTED Final   Candida krusei NOT DETECTED NOT DETECTED Final   Candida parapsilosis NOT DETECTED NOT DETECTED Final   Candida tropicalis NOT DETECTED NOT DETECTED Final   Cryptococcus neoformans/gattii NOT DETECTED NOT DETECTED Final   CTX-M ESBL NOT DETECTED NOT DETECTED Final   Carbapenem resistance IMP NOT DETECTED NOT DETECTED Final   Carbapenem resistance KPC NOT DETECTED NOT DETECTED Final   Meth resistant mecA/C and MREJ DETECTED (A) NOT DETECTED Final    Comment: CRITICAL RESULT CALLED TO, READ BACK BY AND VERIFIED WITH: E. SINCLAIR PHARMD, AT 1458 04/11/23 D. VANHOOK    Carbapenem resistance NDM NOT DETECTED NOT DETECTED Final   Carbapenem resist OXA 48  LIKE NOT DETECTED NOT DETECTED Final   Carbapenem resistance VIM NOT DETECTED NOT DETECTED Final    Comment: Performed at [facility], [address] [place], [place] [address]  Blood culture (routine x 2)     Status: Abnormal   Collection Time: 04/10/23 11:09 PM   Specimen: BLOOD  Result Value Ref Range Status   Specimen Description BLOOD SITE NOT SPECIFIED  Final   Special Requests   Final    BOTTLES DRAWN AEROBIC AND ANAEROBIC Blood Culture results may  not be optimal due to an excessive volume of blood received in culture bottles   Culture  Setup Time   Final    GRAM POSITIVE COCCI IN CLUSTERS IN BOTH AEROBIC AND ANAEROBIC BOTTLES GRAM NEGATIVE RODS AEROBIC BOTTLE ONLY    Culture (A)  Final    STAPHYLOCOCCUS AUREUS PROVIDENCIA RETTGERI SUSCEPTIBILITIES PERFORMED ON PREVIOUS CULTURE WITHIN THE LAST 5 DAYS. Performed at [facility], [address] [place], [place] [address]    Report Status 04/13/2023 FINAL  Final  Resp panel by RT-PCR (RSV, Flu A&B, Covid) Anterior Nasal Swab     Status: None   Collection Time: 04/10/23 11:47 PM   Specimen: Anterior Nasal Swab  Result Value Ref Range Status   SARS Coronavirus 2 by RT PCR NEGATIVE NEGATIVE Final   Influenza A by PCR NEGATIVE NEGATIVE Final   Influenza B by PCR NEGATIVE NEGATIVE Final    Comment: (NOTE) The Xpert Xpress SARS-CoV-2/FLU/RSV plus assay is intended as an aid in the diagnosis of influenza from Nasopharyngeal swab specimens and should not be used as a sole basis for treatment. Nasal washings and aspirates are unacceptable for Xpert Xpress SARS-CoV-2/FLU/RSV testing.  Fact Sheet for Patients: BloggerCourse.com  Fact Sheet for Healthcare Providers: SeriousBroker.it  This test is not yet approved or cleared by the [place] FDA and has been authorized for detection and/or diagnosis of SARS-CoV-2 by FDA under an Emergency Use Authorization (EUA).  This EUA will remain in effect (meaning this test can be used) for the duration of the COVID-19 declaration under Section 564(b)(1) of the Act, 21 U.S.C. section 360bbb-3(b)(1), unless the authorization is terminated or revoked.     Resp Syncytial Virus by PCR NEGATIVE NEGATIVE Final    Comment: (NOTE) Fact Sheet for Patients: BloggerCourse.com  Fact Sheet for Healthcare Providers: SeriousBroker.it  This test is not yet approved or cleared by the [place] FDA and has been authorized for detection and/or diagnosis of SARS-CoV-2 by FDA under an Emergency Use Authorization (EUA). This EUA will remain in effect (meaning this test can be used) for the duration of the COVID-19 declaration under Section 564(b)(1) of the Act, 21 U.S.C. section 360bbb-3(b)(1), unless the authorization is terminated or revoked.  Performed at [facility], [address], [place], [place] [address]   Respiratory (~20 pathogens) panel by PCR     Status: None   Collection Time: 04/11/23  8:20 AM   Specimen: Nasopharyngeal Swab; Respiratory  Result Value Ref Range Status   Adenovirus NOT DETECTED NOT DETECTED Final   Coronavirus 229E NOT DETECTED NOT DETECTED Final    Comment: (NOTE) The Coronavirus on the Respiratory Panel, DOES NOT test for the novel  Coronavirus (2019 nCoV)    Coronavirus HKU1 NOT DETECTED NOT DETECTED Final   Coronavirus NL63 NOT DETECTED NOT DETECTED Final   Coronavirus OC43 NOT DETECTED NOT DETECTED Final   Metapneumovirus NOT DETECTED NOT DETECTED Final   Rhinovirus / Enterovirus NOT DETECTED NOT DETECTED Final   Influenza A NOT DETECTED NOT DETECTED Final   Influenza B NOT DETECTED NOT DETECTED Final   Parainfluenza Virus 1 NOT DETECTED NOT DETECTED Final   Parainfluenza Virus 2 NOT DETECTED NOT DETECTED Final   Parainfluenza Virus 3 NOT DETECTED NOT DETECTED Final   Parainfluenza Virus 4 NOT DETECTED NOT DETECTED  Final   Respiratory Syncytial Virus NOT DETECTED NOT DETECTED Final   Bordetella pertussis NOT DETECTED NOT DETECTED Final   Bordetella Parapertussis NOT DETECTED NOT DETECTED Final  Chlamydophila pneumoniae NOT DETECTED NOT DETECTED Final   Mycoplasma pneumoniae NOT DETECTED NOT DETECTED Final    Comment: Performed at [facility], [address] [place], [place] [address]  Culture, blood (Routine X 2) w Reflex to ID Panel     Status: None   Collection Time: 04/12/23  2:54 AM   Specimen: BLOOD RIGHT HAND  Result Value Ref Range Status   Specimen Description BLOOD RIGHT HAND  Final   Special Requests   Final    BOTTLES DRAWN AEROBIC ONLY Blood Culture adequate volume   Culture   Final    NO GROWTH 5 DAYS Performed at [facility], [address] [place], [place] [address]    Report Status 04/17/2023 FINAL  Final  Culture, blood (Routine X 2) w Reflex to ID Panel     Status: None   Collection Time: 04/12/23  2:55 AM   Specimen: BLOOD RIGHT ARM  Result Value Ref Range Status   Specimen Description BLOOD RIGHT ARM  Final   Special Requests   Final    BOTTLES DRAWN AEROBIC ONLY Blood Culture adequate volume   Culture   Final    NO GROWTH 5 DAYS Performed at [facility], [address], [place], [place] [address]    Report Status 04/17/2023 FINAL  Final     Marcos Eke, NP [facility]  04/18/2023  11:39 AM

## 2023-04-18 NOTE — Progress Notes (Signed)
Micheal Moore.  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  At baseline the patient is reportedly dependent upon significant assistance in his care, but frequently does not take his medications, and is not ambulatory.  Following his admission the patient was found to be suffering with bacteremia with no clear source thus far having been elucidated.  Goals of Care:   Code Status: Full Code   DVT prophylaxis: enoxaparin (LOVENOX) injection 40 mg Start: 04/11/23 1000 SCDs Start: 04/11/23 0133  Interim Hx: Afebrile.  Vital signs stable.  No acute events recorded overnight.  Continues to await TEE.  No new complaints today.  Assessment & Plan:  Polymicrobial bacteremia - MRSA and Providencia Continue empiric antibiotic as per ID recommendations -TTE revealed no vegetations -presently awaiting TEE  Suspected acute aspiration pneumonia Perhaps in the setting of the patient's acute nausea and vomiting combined with his bedridden status he suffered an episode of aspiration leading to his bacteremia -is more than adequately covered with his current antibiotic regimen  Acute nausea and vomiting Resolved  Chronic orthostatic hypotension Continue usual midodrine and fludrocortisone  History of CAD Status post STEMI with stent placement 01/15/2023 -no beta-blocker due to orthostatic hypotension -continue Brilinta and aspirin  Uncontrolled DM2 Patient admitted to using pen needles over and over and has been educated that this is not appropriate - A1c 13.19 December 2022  Normocytic anemia  PVD   Family Communication: No family present at time of exam Disposition:  Need results of TEE prior to being able to make plans for discharge   Objective: Blood pressure 116/68, pulse 84, temperature 98.1 F (36.7 C), temperature source Oral, resp. rate 19, weight 85.4 kg, SpO2 99%.  Intake/Output Summary (Last 24 hours) at 04/18/2023 1004 Last data filed at 04/18/2023 0830 Gross per 24 hour  Intake 490 ml  Output 2200 ml  Net -1710 ml   Filed Weights   04/15/23 0500 04/16/23 0500 04/17/23 0448  Weight: 89.5 kg 85 kg 85.4 kg    Examination: General: No acute respiratory distress Lungs: Clear to auscultation bilaterally  Cardiovascular: Regular rate and rhythm without murmur  Abdomen: Nontender, nondistended, soft, bowel sounds positive, no rebound Extremities: No significant edema bilateral lower extremities  CBC: Recent Labs  Lab 04/13/23 0323 04/18/23 0302  WBC 5.0 8.4  HGB 11.1* 11.8*  HCT 34.0* 37.1*  MCV 90.9 95.9  PLT 212 317   Basic Metabolic Panel: Recent Labs  Lab 04/16/23 0011 04/17/23 0434 04/18/23 0302  NA 138 135 135  K 3.8 4.2 4.0  CL 103 98 99  CO2 26 27 28   GLUCOSE 186* 212* 154*  BUN 18 18 16   CREATININE 0.73 0.66 0.60*  CALCIUM 8.9 9.0 8.8*   GFR: Estimated Creatinine Clearance: 100.8 mL/min (A) (by C-G formula based on SCr of 0.6 mg/dL (L)).   Scheduled Meds:  aspirin EC  81 mg Oral Daily   empagliflozin  10 mg Oral Daily   enoxaparin (LOVENOX) injection  40 mg Subcutaneous Q24H   feeding supplement (GLUCERNA SHAKE)  237 mL Oral TID BM   fludrocortisone  0.2 mg Oral Daily   insulin aspart  0-9 Units Subcutaneous TID WC  insulin aspart  7 Units Subcutaneous TID WC   insulin glargine-yfgn  25 Units Subcutaneous QHS   midodrine  7.5 mg Oral TID WC   pantoprazole  40 mg Oral Daily   rosuvastatin  5 mg Oral Once per day on Monday Thursday   ticagrelor  90 mg Oral BID   Continuous Infusions:  ceFEPime (MAXIPIME) IV 2 g (04/18/23 0747)   vancomycin 750 mg (04/18/23 0954)     LOS: 7 days   Lonia Blood, MD [facility] Office  [phone] Pager - Text Page per Loretha Stapler  If 7PM-7AM, please contact night-coverage per Amion 04/18/2023, 10:04 AM

## 2023-04-18 NOTE — Plan of Care (Signed)
  Problem: Education: Goal: Knowledge of General Education information will improve Description: Including pain rating scale, medication(s)/side effects and non-pharmacologic comfort measures Outcome: Progressing   Problem: Health Behavior/Discharge Planning: Goal: Ability to manage health-related needs will improve Outcome: Progressing   Problem: Nutrition: Goal: Adequate nutrition will be maintained Outcome: Completed/Met   Problem: Safety: Goal: Ability to remain free from injury will improve Outcome: Completed/Met   Problem: Skin Integrity: Goal: Risk for impaired skin integrity will decrease Outcome: Not Progressing   Problem: Activity: Goal: Ability to tolerate increased activity will improve Outcome: Not Progressing

## 2023-04-18 NOTE — H&P (View-Only) (Signed)
Micheal Moore.  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  At baseline the patient is reportedly dependent upon significant assistance in his care, but frequently does not take his medications, and is not ambulatory.  Following his admission the patient was found to be suffering with bacteremia with no clear source thus far having been elucidated.  Goals of Care:   Code Status: Full Code   DVT prophylaxis: enoxaparin (LOVENOX) injection 40 mg Start: 04/11/23 1000 SCDs Start: 04/11/23 0133  Interim Hx: Afebrile.  Vital signs stable.  No acute events recorded overnight.  Continues to await TEE.  No new complaints today.  Assessment & Plan:  Polymicrobial bacteremia - MRSA and Providencia Continue empiric antibiotic as per ID recommendations -TTE revealed no vegetations -presently awaiting TEE  Suspected acute aspiration pneumonia Perhaps in the setting of the patient's acute nausea and vomiting combined with his bedridden status he suffered an episode of aspiration leading to his bacteremia -is more than adequately covered with his current antibiotic regimen  Acute nausea and vomiting Resolved  Chronic orthostatic hypotension Continue usual midodrine and fludrocortisone  History of CAD Status post STEMI with stent placement 01/15/2023 -no beta-blocker due to orthostatic hypotension -continue Brilinta and aspirin  Uncontrolled DM2 Patient admitted to using pen needles over and over and has been educated that this is not appropriate - A1c 13.19 December 2022  Normocytic anemia  PVD   Family Communication: No family present at time of exam Disposition:  Need results of TEE prior to being able to make plans for discharge   Objective: Blood pressure 116/68, pulse 84, temperature 98.1 F (36.7 C), temperature source Oral, resp. rate 19, weight 85.4 kg, SpO2 99%.  Intake/Output Summary (Last 24 hours) at 04/18/2023 1004 Last data filed at 04/18/2023 0830 Gross per 24 hour  Intake 490 ml  Output 2200 ml  Net -1710 ml   Filed Weights   04/15/23 0500 04/16/23 0500 04/17/23 0448  Weight: 89.5 kg 85 kg 85.4 kg    Examination: General: No acute respiratory distress Lungs: Clear to auscultation bilaterally  Cardiovascular: Regular rate and rhythm without murmur  Abdomen: Nontender, nondistended, soft, bowel sounds positive, no rebound Extremities: No significant edema bilateral lower extremities  CBC: Recent Labs  Lab 04/13/23 0323 04/18/23 0302  WBC 5.0 8.4  HGB 11.1* 11.8*  HCT 34.0* 37.1*  MCV 90.9 95.9  PLT 212 317   Basic Metabolic Panel: Recent Labs  Lab 04/16/23 0011 04/17/23 0434 04/18/23 0302  NA 138 135 135  K 3.8 4.2 4.0  CL 103 98 99  CO2 26 27 28   GLUCOSE 186* 212* 154*  BUN 18 18 16   CREATININE 0.73 0.66 0.60*  CALCIUM 8.9 9.0 8.8*   GFR: Estimated Creatinine Clearance: 100.8 mL/min (A) (by C-G formula based on SCr of 0.6 mg/dL (L)).   Scheduled Meds:  aspirin EC  81 mg Oral Daily   empagliflozin  10 mg Oral Daily   enoxaparin (LOVENOX) injection  40 mg Subcutaneous Q24H   feeding supplement (GLUCERNA SHAKE)  237 mL Oral TID BM   fludrocortisone  0.2 mg Oral Daily   insulin aspart  0-9 Units Subcutaneous TID WC  insulin aspart  7 Units Subcutaneous TID WC   insulin glargine-yfgn  25 Units Subcutaneous QHS   midodrine  7.5 mg Oral TID WC   pantoprazole  40 mg Oral Daily   rosuvastatin  5 mg Oral Once per day on Monday Thursday   ticagrelor  90 mg Oral BID   Continuous Infusions:  ceFEPime (MAXIPIME) IV 2 g (04/18/23 0747)   vancomycin 750 mg (04/18/23 0954)     LOS: 7 days   Lonia Blood, MD [facility] Office  [phone] Pager - Text Page per Loretha Stapler  If 7PM-7AM, please contact night-coverage per Amion 04/18/2023, 10:04 AM

## 2023-04-18 NOTE — Progress Notes (Signed)
Update given to MD CBG 125 after the dextrose but patient has long acting insulin 25 units to be given 2200 and NPO after 0000

## 2023-04-18 NOTE — Progress Notes (Signed)
Patient vomiting and was given Zofran at 1818 checked CBG 78 patient stated that he feels like his blood sugar is low Dr. Loney Loh was notified via chat

## 2023-04-19 ENCOUNTER — Inpatient Hospital Stay (HOSPITAL_COMMUNITY): Payer: Medicare HMO

## 2023-04-19 ENCOUNTER — Other Ambulatory Visit: Payer: Self-pay

## 2023-04-19 ENCOUNTER — Inpatient Hospital Stay (HOSPITAL_COMMUNITY)
Admit: 2023-04-19 | Discharge: 2023-04-19 | Disposition: A | Discharge status: Home / Self Care | Payer: Medicare HMO | Attending: Home Health | Admitting: Home Health

## 2023-04-19 ENCOUNTER — Encounter (HOSPITAL_COMMUNITY)
Admission: EM | Disposition: A | Discharge status: Home Health | Payer: Self-pay | Source: Home / Self Care | Attending: Internal Medicine

## 2023-04-19 DIAGNOSIS — I2510 Atherosclerotic heart disease of native coronary artery without angina pectoris: Secondary | ICD-10-CM

## 2023-04-19 DIAGNOSIS — I959 Hypotension, unspecified: Secondary | ICD-10-CM | POA: Diagnosis not present

## 2023-04-19 DIAGNOSIS — S8992XA Unspecified injury of left lower leg, initial encounter: Secondary | ICD-10-CM

## 2023-04-19 DIAGNOSIS — R7881 Bacteremia: Secondary | ICD-10-CM

## 2023-04-19 DIAGNOSIS — I5032 Chronic diastolic (congestive) heart failure: Secondary | ICD-10-CM

## 2023-04-19 DIAGNOSIS — A4902 Methicillin resistant Staphylococcus aureus infection, unspecified site: Secondary | ICD-10-CM

## 2023-04-19 DIAGNOSIS — E785 Hyperlipidemia, unspecified: Secondary | ICD-10-CM

## 2023-04-19 HISTORY — PX: TEE WITHOUT CARDIOVERSION: SHX5443

## 2023-04-19 LAB — ECHO TEE
AV Mean grad: 2 mmHg
AV Peak grad: 3.5 mmHg
Ao pk vel: 0.94 m/s

## 2023-04-19 LAB — GLUCOSE, CAPILLARY
Glucose-Capillary: 129 mg/dL — ABNORMAL HIGH (ref 70–99)
Glucose-Capillary: 133 mg/dL — ABNORMAL HIGH (ref 70–99)
Glucose-Capillary: 149 mg/dL — ABNORMAL HIGH (ref 70–99)
Glucose-Capillary: 168 mg/dL — ABNORMAL HIGH (ref 70–99)
Glucose-Capillary: 168 mg/dL — ABNORMAL HIGH (ref 70–99)
Glucose-Capillary: 186 mg/dL — ABNORMAL HIGH (ref 70–99)

## 2023-04-19 LAB — CK: Total CK: 36 U/L — ABNORMAL LOW (ref 49–397)

## 2023-04-19 SURGERY — ECHOCARDIOGRAM, TRANSESOPHAGEAL
Anesthesia: Monitor Anesthesia Care

## 2023-04-19 MED ORDER — POLYETHYLENE GLYCOL 3350 17 G PO PACK
17.0000 g | PACK | Freq: Two times a day (BID) | ORAL | Status: DC | PRN
  Administered 2023-04-20 – 2023-04-21 (×3): 17 g via ORAL
  Filled 2023-04-19 (×4): qty 1

## 2023-04-19 MED ORDER — PHENYLEPHRINE 80 MCG/ML (10ML) SYRINGE FOR IV PUSH (FOR BLOOD PRESSURE SUPPORT)
PREFILLED_SYRINGE | INTRAVENOUS | Status: DC | PRN
  Administered 2023-04-19: 160 ug via INTRAVENOUS

## 2023-04-19 MED ORDER — SODIUM CHLORIDE 0.9% FLUSH
10.0000 mL | Freq: Two times a day (BID) | INTRAVENOUS | Status: DC
  Administered 2023-04-20 (×2): 10 mL
  Administered 2023-04-20 – 2023-04-21 (×2): 30 mL
  Administered 2023-04-21 – 2023-04-22 (×2): 10 mL

## 2023-04-19 MED ORDER — EPHEDRINE SULFATE-NACL 50-0.9 MG/10ML-% IV SOSY
PREFILLED_SYRINGE | INTRAVENOUS | Status: DC | PRN
  Administered 2023-04-19: 10 mg via INTRAVENOUS

## 2023-04-19 MED ORDER — SODIUM CHLORIDE 0.9 % IV SOLN: INTRAVENOUS | Status: DC

## 2023-04-19 MED ORDER — LIDOCAINE 2% (20 MG/ML) 5 ML SYRINGE
INTRAMUSCULAR | Status: DC | PRN
  Administered 2023-04-19: 100 mg via INTRAVENOUS

## 2023-04-19 MED ORDER — DAPTOMYCIN IV (FOR PTA / DISCHARGE USE ONLY): 700.0000 mg | INTRAVENOUS | 0 refills | Status: DC

## 2023-04-19 MED ORDER — SODIUM CHLORIDE 0.9 % IV SOLN
INTRAVENOUS | Status: DC | PRN
Start: 2023-04-19 — End: 2023-04-19

## 2023-04-19 MED ORDER — SODIUM CHLORIDE 0.9 % IV SOLN
8.0000 mg/kg | Freq: Every day | INTRAVENOUS | Status: DC
  Administered 2023-04-19 – 2023-04-22 (×4): 700 mg via INTRAVENOUS
  Filled 2023-04-19 (×5): qty 14

## 2023-04-19 MED ORDER — CHLORHEXIDINE GLUCONATE CLOTH 2 % EX PADS
6.0000 | MEDICATED_PAD | Freq: Every day | CUTANEOUS | Status: DC
  Administered 2023-04-19 – 2023-04-22 (×4): 6 via TOPICAL

## 2023-04-19 MED ORDER — CIPROFLOXACIN HCL 500 MG PO TABS
750.0000 mg | ORAL_TABLET | Freq: Two times a day (BID) | ORAL | Status: DC
  Administered 2023-04-19 – 2023-04-22 (×6): 750 mg via ORAL
  Filled 2023-04-19: qty 2
  Filled 2023-04-19: qty 1
  Filled 2023-04-19 (×5): qty 2

## 2023-04-19 MED ORDER — PROPOFOL 500 MG/50ML IV EMUL
INTRAVENOUS | Status: DC | PRN
  Administered 2023-04-19: 80 ug/kg/min via INTRAVENOUS
  Administered 2023-04-19: 50 mg via INTRAVENOUS

## 2023-04-19 MED ORDER — SODIUM CHLORIDE 0.9% FLUSH: 10.0000 mL | INTRAVENOUS | Status: DC | PRN

## 2023-04-19 NOTE — Interval H&P Note (Signed)
History and Physical Interval Note:  04/19/2023 1:17 PM  Micheal Moore.  has presented today for surgery, with the diagnosis of bacteremia.  The various methods of treatment have been discussed with the patient and family. After consideration of risks, benefits and other options for treatment, the patient has consented to  Procedure(s): TRANSESOPHAGEAL ECHOCARDIOGRAM (N/A) as a surgical intervention.  The patient's history has been reviewed, patient examined, no change in status, stable for surgery.  I have reviewed the patient's chart and labs.  Questions were answered to the patient's satisfaction.     Little Ishikawa

## 2023-04-19 NOTE — Anesthesia Postprocedure Evaluation (Signed)
Anesthesia Post Note  Patient: Micheal Moore.  Procedure(s) Performed: TRANSESOPHAGEAL ECHOCARDIOGRAM     Patient location during evaluation: PACU Anesthesia Type: MAC Level of consciousness: awake and alert Pain management: pain level controlled Vital Signs Assessment: post-procedure vital signs reviewed and stable Respiratory status: spontaneous breathing, nonlabored ventilation, respiratory function stable and patient connected to nasal cannula oxygen Cardiovascular status: stable and blood pressure returned to baseline Postop Assessment: no apparent nausea or vomiting Anesthetic complications: no  No notable events documented.  Last Vitals:  Vitals:   04/19/23 1400 04/19/23 1415  BP: 108/65 125/70  Pulse:    Resp: 15 15  Temp:  37.4 C  SpO2: 94% 96%    Last Pain:  Vitals:   04/19/23 1415  TempSrc: Temporal  PainSc:                  [name]

## 2023-04-19 NOTE — CV Procedure (Signed)
     TRANSESOPHAGEAL ECHOCARDIOGRAM   NAME:  Micheal Moore.   MRN: [id number] DOB:  1951-08-09   ADMIT DATE: 04/10/2023  INDICATIONS: Bacteremia  PROCEDURE:   Informed consent was obtained prior to the procedure. The risks, benefits and alternatives for the procedure were discussed and the patient comprehended these risks.  Risks include, but are not limited to, cough, sore throat, vomiting, nausea, somnolence, esophageal and stomach trauma or perforation, bleeding, low blood pressure, aspiration, pneumonia, infection, trauma to the teeth and death.    After a procedural time-out, the oropharynx was anesthetized and the patient was sedated by the anesthesia service. The transesophageal probe was inserted in the esophagus and stomach without difficulty and multiple views were obtained. Anesthesia was monitored by Reggy Eye, CRNA.    COMPLICATIONS:    Developed hypotension from to sedation, required ephedrine and phenylephrine during procedure  FINDINGS:  No vegetation seen   Epifanio Lesches MD [facility]  [address], [address] [place], [place] [address] [phone]   1:48 PM \

## 2023-04-19 NOTE — Progress Notes (Signed)
Peripherally Inserted Central Catheter Placement  The IV Nurse has discussed with the patient and/or persons authorized to consent for the patient, the purpose of this procedure and the potential benefits and risks involved with this procedure.  The benefits include less needle sticks, lab draws from the catheter, and the patient may be discharged home with the catheter. Risks include, but not limited to, infection, bleeding, blood clot (thrombus formation), and puncture of an artery; nerve damage and irregular heartbeat and possibility to perform a PICC exchange if needed/ordered by physician.  Alternatives to this procedure were also discussed.  Bard Power PICC patient education guide, fact sheet on infection prevention and patient information card has been provided to patient /or left at bedside.    PICC Placement Documentation  PICC Single Lumen 04/19/23 Right Basilic 39 cm 0 cm (Active)  Indication for Insertion or Continuance of Line Home intravenous therapies (PICC only) 04/19/23 1700  Exposed Catheter (cm) 0 cm 04/19/23 1700  Site Assessment Clean, Dry, Intact 04/19/23 1700  Line Status Flushed;Saline locked;Blood return noted 04/19/23 1700  Dressing Type Transparent;Securing device 04/19/23 1700  Dressing Status Antimicrobial disc in place;Clean, Dry, Intact 04/19/23 1700  Safety Lock Not Applicable 04/19/23 1700  Line Care Connections checked and tightened 04/19/23 1700  Line Adjustment (NICU/IV Team Only) No 04/19/23 1700  Dressing Intervention New dressing 04/19/23 1700  Dressing Change Due 04/26/23 04/19/23 1700       Timmothy Sours 04/19/2023, 5:04 PM

## 2023-04-19 NOTE — Progress Notes (Signed)
Micheal Moore.  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  At baseline the patient is reportedly dependent upon significant assistance in his care, but frequently does not take his medications, and is not ambulatory.  Following his admission the patient was found to be suffering with bacteremia with no clear source thus far having been elucidated.  Goals of Care:   Code Status: Full Code   DVT prophylaxis: enoxaparin (LOVENOX) injection 40 mg Start: 04/11/23 1000 SCDs Start: 04/11/23 0133  Interim Hx: The patient experienced an episode of vomiting last night.  He has remained stable otherwise.  He is afebrile with stable vital signs at present.  CBG is stable at present.  He is seen in his room post TEE and is in good spirits.  He has no new complaints.  He is feeling well and looking forward to being discharged.  Assessment & Plan:  Polymicrobial bacteremia - MRSA and Providencia Continue empiric antibiotic as per ID recommendations -TTE revealed no vegetations -TEE today without findings to suggest endocarditis/no vegetations -antibiotic agent and course as per ID -PICC line ordered  Suspected acute aspiration pneumonia Perhaps in the setting of the patient's acute nausea and vomiting combined with his bedridden status he suffered an episode of aspiration leading to his bacteremia -is more than adequately covered with his current antibiotic regimen -clinically resolved  Acute nausea and vomiting Had a repeat episode last night which appears to have been self-limited -follow  Chronic orthostatic hypotension Continue usual midodrine and  fludrocortisone  History of CAD Status post STEMI with stent placement 01/15/2023 -no beta-blocker due to orthostatic hypotension -continue Brilinta and aspirin  Uncontrolled DM2 Patient admitted to using pen needles over and over and has been educated that this is not appropriate - A1c 13.19 December 2022  Normocytic anemia  PVD  Family Communication: No family present at time of exam Disposition: Need results of TEE prior to being able to make plans for discharge   Objective: Blood pressure 118/83, pulse 83, temperature 97.8 F (36.6 C), temperature source Oral, resp. rate 19, weight 85.4 kg, SpO2 100%.  Intake/Output Summary (Last 24 hours) at 04/19/2023 1044 Last data filed at 04/19/2023 4782 Gross per 24 hour  Intake 120 ml  Output 1600 ml  Net -1480 ml   Filed Weights   04/15/23 0500 04/16/23 0500 04/17/23 0448  Weight: 89.5 kg 85 kg 85.4 kg    Examination: General: No acute respiratory distress Lungs: Clear to auscultation bilaterally  Cardiovascular: Regular rate and rhythm  Abdomen: Nontender, nondistended, soft, bowel sounds positive, no rebound Extremities: No significant edema bilateral lower extremities  CBC: Recent Labs  Lab 04/13/23 0323 04/18/23 0302  WBC 5.0 8.4  HGB 11.1* 11.8*  HCT 34.0* 37.1*  MCV 90.9 95.9  PLT 212 317   Basic Metabolic Panel: Recent Labs  Lab 04/16/23 0011 04/17/23 0434 04/18/23 0302  NA 138 135 135  K 3.8 4.2 4.0  CL 103 98 99  CO2 26 27 28   GLUCOSE 186* 212* 154*  BUN 18 18 16   CREATININE 0.73 0.66 0.60*  CALCIUM 8.9 9.0 8.8*   GFR: Estimated Creatinine Clearance: 100.8 mL/min (A) (by C-G formula based on  SCr of 0.6 mg/dL (L)).   Scheduled Meds:  aspirin EC  81 mg Oral Daily   empagliflozin  10 mg Oral Daily   enoxaparin (LOVENOX) injection  40 mg Subcutaneous Q24H   feeding supplement (GLUCERNA SHAKE)  237 mL Oral TID BM   fludrocortisone  0.2 mg Oral Daily   insulin aspart  0-9 Units Subcutaneous Q4H    midodrine  7.5 mg Oral TID WC   pantoprazole  40 mg Oral Daily   rosuvastatin  5 mg Oral Once per day on Monday Thursday   ticagrelor  90 mg Oral BID   Continuous Infusions:  ceFEPime (MAXIPIME) IV 2 g (04/19/23 0839)   promethazine (PHENERGAN) injection (IM or IVPB)     vancomycin Stopped (04/19/23 0909)     LOS: 8 days   Lonia Blood, MD [facility] Office  [phone] Pager - Text Page per Loretha Stapler  If 7PM-7AM, please contact night-coverage per Amion 04/19/2023, 10:44 AM

## 2023-04-19 NOTE — TOC Progression Note (Signed)
Transition of Care (TOC) - Progression Note    Patient Details  Name: Micheal Moore. MRN: [id number] Date of Birth: 1951-07-18  Transition of Care [facility]) CM/SW Contact  Tom-Johnson, Hershal Coria, RN Phone Number: 04/19/2023, 4:17 PM  Clinical Narrative:     Patient's Blood Culture result from 04/12/23 shows no growth with MRSA Providencia rettgeri. ID following. On IV abx and will discharge home on IV abx. Underwent TEE today 04/19/23 to r/o Endocarditis which shows no Vegetation.   [facility] referred for home IV infusion and [facility] referred for RN. Patient is already active with [facility] for PT/OT disciplines.  Plan to discharge home tomorrow.  Pam with Julianne Rice and Denyse Amass with [facility] aware.   CM will continue to follow as patient progresses with care towards discharge.     Expected Discharge Plan and Services                                               Social Determinants of Health (SDOH) Interventions SDOH Screenings   Food Insecurity: No Food Insecurity (01/16/2023)  Housing: Low Risk  (01/16/2023)  Transportation Needs: No Transportation Needs (01/16/2023)  Utilities: Not At Risk (01/16/2023)  Tobacco Use: High Risk (01/15/2023)    Readmission Risk Interventions    04/12/2023    4:47 PM 07/25/2022    2:42 PM  Readmission Risk Prevention Plan  Transportation Screening Complete   PCP or Specialist Appt within 5-7 Days    PCP or Specialist Appt within 3-5 Days Complete   Home Care Screening    Medication Review (RN CM)    HRI or Home Care Consult Complete   Social Work Consult for Recovery Care Planning/Counseling Complete   Palliative Care Screening Not Applicable   Medication Review (RN Care Manager) Referral to Pharmacy      Information is confidential and restricted. Go to Review Flowsheets to unlock data.

## 2023-04-19 NOTE — Progress Notes (Signed)
PHARMACY CONSULT NOTE FOR:  OUTPATIENT  PARENTERAL ANTIBIOTIC THERAPY (OPAT)  Indication: MRSA bacteremia Regimen: Daptomycin 700 mg IV every 24 hours End date: 05/10/23  IV antibiotic discharge orders are pended. To discharging provider:  please sign these orders via discharge navigator,  Select New Orders & click on the button choice - Manage This Unsigned Work.     Thank you for allowing pharmacy to be a part of this patient's care.  Georgina Pillion, PharmD, BCPS, BCIDP Infectious Diseases Clinical Pharmacist 04/19/2023 2:40 PM   **Pharmacist phone directory can now be found on [email or web address] ([email or web address]).  Listed under [facility].

## 2023-04-19 NOTE — Anesthesia Preprocedure Evaluation (Addendum)
Anesthesia Evaluation  Patient identified by MRN, date of birth, ID band Patient awake    Reviewed: Allergy & Precautions, Patient's Chart, lab work & pertinent test results  Airway Mallampati: II  TM Distance: >3 FB Neck ROM: Full    Dental  (+) Poor Dentition, Chipped, Missing, Dental Advisory Given   Pulmonary Current Smoker    + decreased breath sounds      Cardiovascular + CAD, + Past MI, + Peripheral Vascular Disease and +CHF   Rhythm:Regular Rate:Normal     Neuro/Psych negative neurological ROS  negative psych ROS   GI/Hepatic negative GI ROS, Neg liver ROS,,,  Endo/Other  diabetes, Type 2, Insulin Dependent, Oral Hypoglycemic Agents    Renal/GU negative Renal ROS     Musculoskeletal negative musculoskeletal ROS (+)    Abdominal   Peds  Hematology   Anesthesia Other Findings   Reproductive/Obstetrics                             Anesthesia Physical Anesthesia Plan  ASA: 3  Anesthesia Plan: MAC   Post-op Pain Management: Minimal or no pain anticipated   Induction: Intravenous  PONV Risk Score and Plan: 0 and Propofol infusion  Airway Management Planned: Natural Airway and Nasal Cannula  Additional Equipment: None  Intra-op Plan:   Post-operative Plan:   Informed Consent: I have reviewed the patients History and Physical, chart, labs and discussed the procedure including the risks, benefits and alternatives for the proposed anesthesia with the patient or authorized representative who has indicated his/her understanding and acceptance.       Plan Discussed with: CRNA  Anesthesia Plan Comments:        Anesthesia Quick Evaluation

## 2023-04-19 NOTE — Transfer of Care (Signed)
Immediate Anesthesia Transfer of Care Note  Patient: Micheal Moore.  Procedure(s) Performed: TRANSESOPHAGEAL ECHOCARDIOGRAM  Patient Location: PACU and Cath Lab  Anesthesia Type:MAC  Level of Consciousness: awake and alert   Airway & Oxygen Therapy: Patient Spontanous Breathing  Post-op Assessment: Report given to RN  Post vital signs: stable  Last Vitals:  Vitals Value Taken Time  BP    Temp    Pulse    Resp    SpO2      Last Pain:  Vitals:   04/19/23 1306  TempSrc: Temporal  PainSc:          Complications: No notable events documented.

## 2023-04-19 NOTE — Plan of Care (Signed)
Moisture cream applied to back

## 2023-04-19 NOTE — Progress Notes (Signed)
[facility] Infectious Diseases Follow Up Note  Patient Identification: Patient Name: Basim Bartnik. MRN: [id number] Admit Date: 04/10/2023 10:05 PM Age: 72 y.o.Today's Date: 04/19/2023  Reason for Visit: Polymicrobial bacteremia`  Principal Problem:   Aspiration pneumonia (HCC) Active Problems:   Type 2 diabetes mellitus with hyperlipidemia (HCC)   History of DVT (deep vein thrombosis)   Hyperlipidemia   Orthostatic hypotension   Normocytic anemia   Pressure injury of skin   Nausea and vomiting   Elevated troponin   CAD (coronary artery disease)   Hypokalemia   PVD (peripheral vascular disease) (HCC)   Debility   Bacteremia   Medication management   Current Antibiotics: Vancomycin  Cefepime   Lines/Hardwares: Right IM nail, Left total hip arthroplasty  Interval Events:Continues to be afebrile Low labs today TEE planned for today   Assessment 72 year old male with history of CAD status post PCI, DM 2, right lower extremity DVT, PAD and 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  Left x-ray with no osteomyelitis  Recommendations Continue Vancomycin, pharmacy to dose. OK to switch to daptomycin if cost not an issue  OK to switch cefepime to ciprofloxacin through 8/7 Monitor CBC, BMP and Vancomycin trough  ID pharm D to place OPAT orders  ID will so, please call with questions  OPAT  Diagnosis: Complicated MRSA /Providencia rettgeri bacteremia   Culture Result: MRSA Providencia rettgeri  No Known Allergies  OPAT  Orders Discharge antibiotics to be given via PICC line Discharge antibiotics: Vancomycin, pharmacy to dose or Daptomycin  Per pharmacy protocol  Aim for Vancomycin trough 15-20 or AUC 400-550 (unless otherwise indicated) Duration: 4 weeks  End Date: 05/10/23  [facility] Care Per Protocol:  Home health RN for IV administration and teaching; PICC line care and labs.    Labs weekly while on IV antibiotics: X__ CBC with differential X__ BMP __ CMP __ CRP __ ESR X__ Vancomycin trough twice a week if Vancomycin used  X__ CK if  daptomycin used   X__ Please pull PIC at completion of IV antibiotics __ Please leave PIC in place until doctor has seen patient or been notified  Fax weekly labs to [phone]  Clinic Follow Up Appt: 8/16 at 11am with Jeanine Luz    Rest of the management as per the primary team. Thank you for the consult. Please page with pertinent questions or concerns.  ______________________________________________________________________ Subjective patient seen and examined at the bedside. No complaints   Past Medical History:  Diagnosis Date   Cancer (HCC)    right elbow melanoma   Diabetes mellitus without complication (HCC)    Type II   Dizziness    DVT (deep venous thrombosis) (HCC)    right leg   Frequent falls    Near syncope    Syncope    Past Surgical History:  Procedure Laterality Date   AMPUTATION TOE Left 04/28/2020   Procedure: AMPUTATION LEFT GREAT TOE;  Surgeon: Nadara Mustard, MD;  Location: [facility] OR;  Service: Orthopedics;  Laterality: Left;   CORONARY/GRAFT ACUTE MI REVASCULARIZATION N/A 01/15/2023   Procedure: Coronary/Graft Acute MI Revascularization;  Surgeon:  Orbie Pyo, MD;  Location: [facility] IN[facility];  Service: Cardiovascular;  Laterality: N/A;   INTRAMEDULLARY (IM) NAIL INTERTROCHANTERIC Right 02/23/2022   Procedure: INTRAMEDULLARY (IM) NAIL INTERTROCHANTRIC, RIGHT;  Surgeon: Samson Frederic, MD;  Location: [facility] ORS;  Service:  Orthopedics;  Laterality: Right;   LEFT HEART CATH AND CORONARY ANGIOGRAPHY N/A 01/15/2023   Procedure: LEFT HEART CATH AND CORONARY ANGIOGRAPHY;  Surgeon: Orbie Pyo, MD;  Location: [facility];  Service: Cardiovascular;  Laterality: N/A;   LOWER EXTREMITY ANGIOGRAPHY Bilateral 01/18/2023   Procedure: Lower Extremity Angiography;  Surgeon: Nada Libman, MD;  [facility]: [facility];  Service: Cardiovascular;  Laterality: Bilateral;   MELANOMA EXCISION WITH SENTINEL LYMPH NODE BIOPSY Right 08/19/2020   Procedure: WIDE LOCAL EXCISION RIGHT ELBOW MELANOMA WITH RIGHT AXILLARY SENTINEL LYMPH NODE BIOPSY;  Surgeon: Fritzi Mandes, MD;  Location: [facility];  Service: General;  Laterality: Right;  2nd incision in axilla   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   Procedure: TOTAL HIP ARTHROPLASTY ANTERIOR APPROACH;  Surgeon: Samson Frederic, MD;  Location: [facility];  Service: Orthopedics;  Laterality: Left;   TOTAL HIP ARTHROPLASTY Left 08/24/2022   Procedure: OPEN REDUCTION, HEAD AND LINER EXCHANGE;  Surgeon: Samson Frederic, MD;  Location: [facility];  Service: Orthopedics;  Laterality: Left;    Vitals BP 118/83 (BP Location: Right Arm)   Pulse 83   Temp 97.8 F (36.6 C) (Oral)   Resp 19   Wt 85.4 kg   SpO2 100%   BMI 22.33 kg/m     Physical Exam Constitutional: Elderly male lying in the bed and appears comfortable    Comments:   Cardiovascular:     Rate and Rhythm: Normal rate and regular rhythm.     Heart sounds:  Pulmonary:     Effort: Pulmonary effort is normal.     Comments:   Abdominal:     Palpations: Abdomen is soft.     Tenderness: Nondistended  Musculoskeletal:        General: No swelling or tenderness in peripheral joints. left great toe amputation.  Left lower leg has a superficial wound with no signs of infection.  Left heel wound, no signs of infection   Skin:    Comments: Wounds as above, no rashes  Neurological:     General: Awake, alert and oriented,  following commands  Psychiatric:        Mood and Affect: Mood normal.   Pertinent Microbiology Results for orders placed or performed during the hospital encounter of 04/10/23  Blood culture (routine x 2)     Status: Abnormal   Collection Time: 04/10/23 10:55 PM   Specimen: BLOOD  Result Value Ref Range Status   Specimen Description BLOOD SITE NOT SPECIFIED  Final   Special Requests   Final    BOTTLES DRAWN AEROBIC AND ANAEROBIC Blood Culture results may not be optimal due to an excessive volume of blood received in culture bottles   Culture  Setup Time   Final    GRAM NEGATIVE RODS GRAM POSITIVE COCCI IN CLUSTERS IN BOTH AEROBIC AND ANAEROBIC BOTTLES CRITICAL RESULT CALLED TO, READ BACK BY AND VERIFIED WITHNorva Riffle PHARMD, AT 1458 04/11/23 Renato Shin Performed at [facility], [address] [place], [place] [address]    Culture (A)  Final    METHICILLIN RESISTANT STAPHYLOCOCCUS AUREUS PROVIDENCIA RETTGERI    Report Status 04/13/2023 FINAL  Final   Organism ID, Bacteria METHICILLIN RESISTANT STAPHYLOCOCCUS AUREUS  Final   Organism ID, Bacteria PROVIDENCIA RETTGERI  Final      Susceptibility   Methicillin resistant staphylococcus aureus - MIC*    CIPROFLOXACIN >=8 RESISTANT Resistant     ERYTHROMYCIN >=8 RESISTANT Resistant     GENTAMICIN <=0.5 SENSITIVE Sensitive     OXACILLIN >=4 RESISTANT Resistant     TETRACYCLINE <=1 SENSITIVE Sensitive     VANCOMYCIN <=0.5 SENSITIVE Sensitive     TRIMETH/SULFA <=10 SENSITIVE Sensitive     CLINDAMYCIN RESISTANT Resistant     RIFAMPIN <=0.5 SENSITIVE Sensitive     Inducible Clindamycin POSITIVE Resistant     LINEZOLID 2 SENSITIVE Sensitive     * METHICILLIN RESISTANT STAPHYLOCOCCUS AUREUS   Providencia rettgeri - MIC*    AMPICILLIN RESISTANT Resistant     CEFEPIME <=0.12 SENSITIVE Sensitive     CEFTAZIDIME <=1 SENSITIVE Sensitive     CEFTRIAXONE <=0.25 SENSITIVE Sensitive     CIPROFLOXACIN <=0.25 SENSITIVE Sensitive      GENTAMICIN <=1 SENSITIVE Sensitive     IMIPENEM 1 SENSITIVE Sensitive     TRIMETH/SULFA <=20 SENSITIVE Sensitive     AMPICILLIN/SULBACTAM <=2 SENSITIVE Sensitive     PIP/TAZO <=4 SENSITIVE Sensitive     * PROVIDENCIA RETTGERI  Blood Culture ID Panel (Reflexed)     Status: Abnormal   Collection Time: 04/10/23 10:55 PM  Result Value Ref Range Status   Enterococcus faecalis NOT DETECTED NOT DETECTED Final   Enterococcus Faecium NOT DETECTED NOT DETECTED Final   Listeria monocytogenes NOT DETECTED NOT DETECTED Final   Staphylococcus species DETECTED (A) NOT DETECTED Final    Comment: CRITICAL RESULT CALLED TO, READ BACK BY AND VERIFIED WITH: E. SINCLAIR PHARMD, AT 1458 04/11/23 D. VANHOOK    Staphylococcus aureus (BCID) DETECTED (A) NOT DETECTED Final    Comment: Methicillin (oxacillin)-resistant Staphylococcus aureus (MRSA). MRSA is predictably resistant to beta-lactam antibiotics (except ceftaroline). Preferred therapy is vancomycin unless clinically contraindicated. Patient requires contact precautions if  hospitalized. CRITICAL RESULT CALLED TO, READ BACK BY AND VERIFIED WITH: E. SINCLAIR PHARMD, AT 1458 04/11/23 D. VANHOOK    Staphylococcus epidermidis NOT DETECTED NOT DETECTED Final   Staphylococcus lugdunensis NOT DETECTED NOT DETECTED Final   Streptococcus species NOT DETECTED NOT DETECTED Final   Streptococcus agalactiae NOT DETECTED NOT DETECTED Final   Streptococcus pneumoniae NOT DETECTED NOT DETECTED Final   Streptococcus pyogenes NOT DETECTED NOT DETECTED Final   A.calcoaceticus-baumannii NOT DETECTED NOT DETECTED Final   Bacteroides fragilis NOT DETECTED NOT DETECTED Final   Enterobacterales DETECTED (A) NOT DETECTED Final    Comment: Enterobacterales represent a large order of gram negative bacteria, not a single organism. Refer to culture for further identification. CRITICAL RESULT CALLED TO, READ BACK BY AND VERIFIED WITH: E. SINCLAIR PHARMD, AT 1458 04/11/23 D. VANHOOK     Enterobacter cloacae complex NOT DETECTED NOT DETECTED Final   Escherichia coli NOT DETECTED NOT DETECTED Final   Klebsiella aerogenes NOT DETECTED NOT DETECTED Final   Klebsiella oxytoca NOT DETECTED NOT DETECTED Final   Klebsiella pneumoniae NOT DETECTED NOT DETECTED Final   Proteus species NOT DETECTED NOT DETECTED Final   Salmonella species NOT DETECTED NOT DETECTED Final   Serratia marcescens NOT DETECTED NOT DETECTED Final   Haemophilus influenzae NOT DETECTED NOT DETECTED Final   Neisseria meningitidis NOT DETECTED NOT DETECTED Final   Pseudomonas aeruginosa NOT DETECTED NOT DETECTED Final   Stenotrophomonas maltophilia NOT DETECTED NOT DETECTED Final   Candida albicans NOT DETECTED NOT DETECTED Final   Candida auris  NOT DETECTED NOT DETECTED Final   Candida glabrata NOT DETECTED NOT DETECTED Final   Candida krusei NOT DETECTED NOT DETECTED Final   Candida parapsilosis NOT DETECTED NOT DETECTED Final   Candida tropicalis NOT DETECTED NOT DETECTED Final   Cryptococcus neoformans/gattii NOT DETECTED NOT DETECTED Final   CTX-M ESBL NOT DETECTED NOT DETECTED Final   Carbapenem resistance IMP NOT DETECTED NOT DETECTED Final   Carbapenem resistance KPC NOT DETECTED NOT DETECTED Final   Meth resistant mecA/C and MREJ DETECTED (A) NOT DETECTED Final    Comment: CRITICAL RESULT CALLED TO, READ BACK BY AND VERIFIED WITH: E. SINCLAIR PHARMD, AT 1458 04/11/23 D. VANHOOK    Carbapenem resistance NDM NOT DETECTED NOT DETECTED Final   Carbapenem resist OXA 48 LIKE NOT DETECTED NOT DETECTED Final   Carbapenem resistance VIM NOT DETECTED NOT DETECTED Final    Comment: Performed at [facility], [address] [place], [place] [address]  Blood culture (routine x 2)     Status: Abnormal   Collection Time: 04/10/23 11:09 PM   Specimen: BLOOD  Result Value Ref Range Status   Specimen Description BLOOD SITE NOT SPECIFIED  Final   Special Requests   Final    BOTTLES DRAWN AEROBIC AND  ANAEROBIC Blood Culture results may not be optimal due to an excessive volume of blood received in culture bottles   Culture  Setup Time   Final    GRAM POSITIVE COCCI IN CLUSTERS IN BOTH AEROBIC AND ANAEROBIC BOTTLES GRAM NEGATIVE RODS AEROBIC BOTTLE ONLY    Culture (A)  Final    STAPHYLOCOCCUS AUREUS PROVIDENCIA RETTGERI SUSCEPTIBILITIES PERFORMED ON PREVIOUS CULTURE WITHIN THE LAST 5 DAYS. Performed at [facility], [address], [place], [place] [address]    Report Status 04/13/2023 FINAL  Final  Resp panel by RT-PCR (RSV, Flu A&B, Covid) Anterior Nasal Swab     Status: None   Collection Time: 04/10/23 11:47 PM   Specimen: Anterior Nasal Swab  Result Value Ref Range Status   SARS Coronavirus 2 by RT PCR NEGATIVE NEGATIVE Final   Influenza A by PCR NEGATIVE NEGATIVE Final   Influenza B by PCR NEGATIVE NEGATIVE Final    Comment: (NOTE) The Xpert Xpress SARS-CoV-2/FLU/RSV plus assay is intended as an aid in the diagnosis of influenza from Nasopharyngeal swab specimens and should not be used as a sole basis for treatment. Nasal washings and aspirates are unacceptable for Xpert Xpress SARS-CoV-2/FLU/RSV testing.  Fact Sheet for Patients: BloggerCourse.com  Fact Sheet for Healthcare Providers: SeriousBroker.it  This test is not yet approved or cleared by the [place] FDA and has been authorized for detection and/or diagnosis of SARS-CoV-2 by FDA under an Emergency Use Authorization (EUA). This EUA will remain in effect (meaning this test can be used) for the duration of the COVID-19 declaration under Section 564(b)(1) of the Act, 21 U.S.C. section 360bbb-3(b)(1), unless the authorization is terminated or revoked.     Resp Syncytial Virus by PCR NEGATIVE NEGATIVE Final    Comment: (NOTE) Fact Sheet for Patients: BloggerCourse.com  Fact Sheet for Healthcare  Providers: SeriousBroker.it  This test is not yet approved or cleared by the [place] FDA and has been authorized for detection and/or diagnosis of SARS-CoV-2 by FDA under an Emergency Use Authorization (EUA). This EUA will remain in effect (meaning this test can be used) for the duration of the COVID-19 declaration under Section 564(b)(1) of the Act, 21 U.S.C. section 360bbb-3(b)(1), unless the authorization is  terminated or revoked.  Performed at [facility], [address], [place], [place] [address]   Respiratory (~20 pathogens) panel by PCR     Status: None   Collection Time: 04/11/23  8:20 AM   Specimen: Nasopharyngeal Swab; Respiratory  Result Value Ref Range Status   Adenovirus NOT DETECTED NOT DETECTED Final   Coronavirus 229E NOT DETECTED NOT DETECTED Final    Comment: (NOTE) The Coronavirus on the Respiratory Panel, DOES NOT test for the novel  Coronavirus (2019 nCoV)    Coronavirus HKU1 NOT DETECTED NOT DETECTED Final   Coronavirus NL63 NOT DETECTED NOT DETECTED Final   Coronavirus OC43 NOT DETECTED NOT DETECTED Final   Metapneumovirus NOT DETECTED NOT DETECTED Final   Rhinovirus / Enterovirus NOT DETECTED NOT DETECTED Final   Influenza A NOT DETECTED NOT DETECTED Final   Influenza B NOT DETECTED NOT DETECTED Final   Parainfluenza Virus 1 NOT DETECTED NOT DETECTED Final   Parainfluenza Virus 2 NOT DETECTED NOT DETECTED Final   Parainfluenza Virus 3 NOT DETECTED NOT DETECTED Final   Parainfluenza Virus 4 NOT DETECTED NOT DETECTED Final   Respiratory Syncytial Virus NOT DETECTED NOT DETECTED Final   Bordetella pertussis NOT DETECTED NOT DETECTED Final   Bordetella Parapertussis NOT DETECTED NOT DETECTED Final   Chlamydophila pneumoniae NOT DETECTED NOT DETECTED Final   Mycoplasma pneumoniae NOT DETECTED NOT DETECTED Final    Comment: Performed at [facility], [address], [place], [place] [address]  Culture, blood  (Routine X 2) w Reflex to ID Panel     Status: None   Collection Time: 04/12/23  2:54 AM   Specimen: BLOOD RIGHT HAND  Result Value Ref Range Status   Specimen Description BLOOD RIGHT HAND  Final   Special Requests   Final    BOTTLES DRAWN AEROBIC ONLY Blood Culture adequate volume   Culture   Final    NO GROWTH 5 DAYS Performed at [facility], [address] [place], [place] [address]    Report Status 04/17/2023 FINAL  Final  Culture, blood (Routine X 2) w Reflex to ID Panel     Status: None   Collection Time: 04/12/23  2:55 AM   Specimen: BLOOD RIGHT ARM  Result Value Ref Range Status   Specimen Description BLOOD RIGHT ARM  Final   Special Requests   Final    BOTTLES DRAWN AEROBIC ONLY Blood Culture adequate volume   Culture   Final    NO GROWTH 5 DAYS Performed at [facility], [address], [place], [place] [address]    Report Status 04/17/2023 FINAL  Final   Pertinent Lab.    Latest Ref Rng & Units 04/18/2023    3:02 AM 04/13/2023    3:23 AM 04/11/2023    4:45 AM  CBC  WBC 4.0 - 10.5 K/uL 8.4  5.0  8.9   Hemoglobin 13.0 - 17.0 g/dL 29.5  62.1  30.8   Hematocrit 39.0 - 52.0 % 37.1  34.0  38.3   Platelets 150 - 400 K/uL 317  212  212       Latest Ref Rng & Units 04/18/2023    3:02 AM 04/17/2023    4:34 AM 04/16/2023   12:11 AM  CMP  Glucose 70 - 99 mg/dL 657  846  962   BUN 8 - 23 mg/dL 16  18  18    Creatinine 0.61 - 1.24 mg/dL 9.52  8.41  3.24  Sodium 135 - 145 mmol/L 135  135  138   Potassium 3.5 - 5.1 mmol/L 4.0  4.2  3.8   Chloride 98 - 111 mmol/L 99  98  103   CO2 22 - 32 mmol/L 28  27  26    Calcium 8.9 - 10.3 mg/dL 8.8  9.0  8.9   Total Protein 6.5 - 8.1 g/dL 6.2     Total Bilirubin 0.3 - 1.2 mg/dL 0.5     Alkaline Phos 38 - 126 U/L 59     AST 15 - 41 U/L 16     ALT 0 - 44 U/L 17        Pertinent Imaging today Plain films and CT images have been personally visualized and interpreted; radiology reports have been reviewed. Decision making  incorporated into the Impression  DG Foot Complete Left  Result Date: 04/12/2023 CLINICAL DATA:  Osteomyelitis.  Pain EXAM: LEFT FOOT - COMPLETE 3 VIEW COMPARISON:  None Available. FINDINGS: Moderate well corticated plantar and Achilles calcaneal spurs. Degenerative changes seen about the dorsal aspect of the midfoot. Degenerative changes also seen of the tibiotalar joint. There is severe osteopenia. Scattered soft tissue swelling. 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 No clear areas of bony erosive changes at this time. If there is further concern of bone infection, MRI or bone scan may be useful for further sensitivity. IMPRESSION: Osteopenia with soft tissue swelling and degenerative changes. Calcaneal spurs. 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   On: 04/12/2023 15:47   DG Swallowing Func-Speech Pathology  Result Date: 04/12/2023 Table formatting from the original result was not included. Modified Barium Swallow Study Patient Details Name: Chee Kinslow. MRN: [id number] Date of Birth: [birth year] Today's Date: 04/12/2023 HPI/PMH: HPI: Pt is a 72 yo male presenting to ED 7/25 presenting with nausea, vomiting, and fever. Per MD note, pt's wife reports pt does not ambulate and is bedbound. She also reports that he lays flat in bed to eat/drink. CXR revealed central peribronchial wall thickening with some interstitial and patchy opacities in both lower lobes. PMH includes cancer, T2DM, dizziness with frequent falls, DVT, syncope Clinical Impression: Clinical Impression: Pt presents with an overall functional oropharyngeal swallow. He effectively achieved complete laryngeal vestibule closure during the swallow. Pharyngeal residue present throughout trials of thicker consistencies along base of tongue, valleculae, and pyriform sinuses. He effectively manages this residue and it is cleared with an intermittent cough,  which he initiates independently. No penetration or aspiration noted throughout all trials, although may be at greater risk if deconditioned or if he does not maintain a fully upright position while eating and drinking. The pill was administered with straw sips of thin liquids with prompt and complete clearance during the esophageal sweep. Recommend continuing diet of regular textures with thin liquids only when pt is positioned fully upright. Given recent emesis, recommend staying fully upright ~30 minutes after eating and drinking. SLP will f/u x1 to provide education regarding aspiration precautions. Factors that may increase risk of adverse event in presence of aspiration Rubye Oaks & Clearance Coots 2021): Factors that may increase risk of adverse event in presence of aspiration Rubye Oaks & Clearance Coots 2021): Limited mobility; Frail or deconditioned Recommendations/Plan: Swallowing Evaluation Recommendations Swallowing Evaluation Recommendations Recommendations: PO diet PO Diet Recommendation: Regular; Thin liquids (Level 0) Liquid Administration via: Cup; Straw Medication Administration: Whole meds with liquid Supervision: Patient able to self-feed Swallowing strategies  :  Minimize environmental distractions; Slow rate Postural changes: Position pt fully upright for meals; Stay upright 30-60 min after meals Oral care recommendations: Oral care BID (2x/day) Treatment Plan Treatment Plan Treatment recommendations: Therapy as outlined in treatment plan below Follow-up recommendations: No SLP follow up Functional status assessment: Patient has not had a recent decline in their functional status. Treatment frequency: Min 1x/week Treatment duration: 1 week Interventions: Aspiration precaution training; Patient/family education; Diet toleration management by SLP Recommendations Recommendations for follow up therapy are one component of a multi-disciplinary discharge planning process, led by the attending physician.  Recommendations  may be updated based on patient status, additional functional criteria and insurance authorization. Assessment: Orofacial Exam: Orofacial Exam Oral Cavity: Oral Hygiene: WFL Oral Cavity - Dentition: Poor condition; Missing dentition Orofacial Anatomy: WFL Oral Motor/Sensory Function: WFL Anatomy: Anatomy: Suspected cervical osteophytes Boluses Administered: Boluses Administered Boluses Administered: Thin liquids (Level 0); Mildly thick liquids (Level 2, nectar thick); Moderately thick liquids (Level 3, honey thick); Puree; Solid  Oral Impairment Domain: Oral Impairment Domain Lip Closure: No labial escape Tongue control during bolus hold: Cohesive bolus between tongue to palatal seal Bolus preparation/mastication: Timely and efficient chewing and mashing Bolus transport/lingual motion: Brisk tongue motion Oral residue: Complete oral clearance Location of oral residue : N/A Initiation of pharyngeal swallow : Valleculae  Pharyngeal Impairment Domain: Pharyngeal Impairment Domain Soft palate elevation: No bolus between soft palate (SP)/pharyngeal wall (PW) Laryngeal elevation: Complete superior movement of thyroid cartilage with complete approximation of arytenoids to epiglottic petiole Anterior hyoid excursion: Complete anterior movement Epiglottic movement: Complete inversion Laryngeal vestibule closure: Complete, no air/contrast in laryngeal vestibule Pharyngeal stripping wave : Present - complete Pharyngeal contraction (A/P view only): N/A Pharyngoesophageal segment opening: Complete distension and complete duration, no obstruction of flow Tongue base retraction: Trace column of contrast or air between tongue base and PPW Pharyngeal residue: Collection of residue within or on pharyngeal structures Location of pharyngeal residue: Valleculae; Tongue base; Pyriform sinuses  Esophageal Impairment Domain: Esophageal Impairment Domain Esophageal clearance upright position: Complete clearance, esophageal coating Pill:  Pill Consistency administered: Thin liquids (Level 0) Thin liquids (Level 0): [facility] Penetration/Aspiration Scale Score: Penetration/Aspiration Scale Score 1.  Material does not enter airway: Thin liquids (Level 0); Mildly thick liquids (Level 2, nectar thick); Moderately thick liquids (Level 3, honey thick); Puree; Solid; Pill Compensatory Strategies: Compensatory Strategies Compensatory strategies: Yes Straw: Effective Effective Straw: Thin liquid (Level 0)   General Information: Caregiver present: No  Diet Prior to this Study: Regular; Thin liquids (Level 0)   Temperature : Normal   Respiratory Status: WFL   Supplemental O2: None (Room air)   History of Recent Intubation: No  Behavior/Cognition: Alert; Cooperative; Pleasant mood Self-Feeding Abilities: Able to self-feed Baseline vocal quality/speech: Normal Volitional Cough: Able to elicit Volitional Swallow: Able to elicit Exam Limitations: No limitations Goal Planning: Prognosis for improved oropharyngeal function: Good Barriers to Reach Goals: Time post onset No data recorded Patient/Family Stated Goal: none stated Consulted and agree with results and recommendations: Patient Pain: Pain Assessment Pain Assessment: Faces Faces Pain Scale: 6 Pain Location: feet/toes when bumped/touched Pain Descriptors / Indicators: Guarding; Grimacing; Discomfort Pain Intervention(s): Monitored during session; Repositioned End of Session: Start Time:SLP Start Time (ACUTE ONLY): 1352 Stop Time: SLP Stop Time (ACUTE ONLY): 1411 Time Calculation:SLP Time Calculation (min) (ACUTE ONLY): 19 min Charges: SLP Evaluations $ SLP Speech Visit: 1 Visit SLP Evaluations $BSS Swallow: 1 Procedure $MBS Swallow: 1 Procedure SLP visit diagnosis: SLP Visit Diagnosis: Dysphagia, pharyngeal phase (R13.13)  Past Medical History: Past Medical History: Diagnosis Date  Cancer (HCC)   right elbow melanoma  Diabetes mellitus without complication (HCC)   Type II  Dizziness   DVT (deep venous thrombosis)  (HCC)   right leg  Frequent falls   Near syncope   Syncope  Past Surgical History: Past Surgical History: Procedure Laterality Date  AMPUTATION TOE Left 04/28/2020  Procedure: AMPUTATION LEFT GREAT TOE;  Surgeon: Nadara Mustard, MD;  Location: [facility];  Service: Orthopedics;  Laterality: Left;  CORONARY/GRAFT ACUTE MI REVASCULARIZATION N/A 01/15/2023  Procedure: Coronary/Graft Acute MI Revascularization;  Surgeon: Orbie Pyo, MD;  Location: [facility];  Service: Cardiovascular;  Laterality: N/A;  INTRAMEDULLARY (IM) NAIL INTERTROCHANTERIC Right 02/23/2022  Procedure: INTRAMEDULLARY (IM) NAIL INTERTROCHANTRIC, RIGHT;  Surgeon: Samson Frederic, MD;  Location: [facility];  Service: Orthopedics;  Laterality: Right;  LEFT HEART CATH AND CORONARY ANGIOGRAPHY N/A 01/15/2023  Procedure: LEFT HEART CATH AND CORONARY ANGIOGRAPHY;  Surgeon: Orbie Pyo, MD;  Location: [facility];  Service: Cardiovascular;  Laterality: N/A;  LOWER EXTREMITY ANGIOGRAPHY Bilateral 01/18/2023  Procedure: Lower Extremity Angiography;  Surgeon: Nada Libman, MD;  Location: [facility];  Service: Cardiovascular;  Laterality: Bilateral;  MELANOMA EXCISION WITH SENTINEL LYMPH NODE BIOPSY Right 08/19/2020  Procedure: WIDE LOCAL EXCISION RIGHT ELBOW MELANOMA WITH RIGHT AXILLARY SENTINEL LYMPH NODE BIOPSY;  Surgeon: Fritzi Mandes, MD;  Location: [facility];  Service: General;  Laterality: Right;  2nd incision in axilla  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  Procedure: TOTAL HIP ARTHROPLASTY ANTERIOR APPROACH;  Surgeon: Samson Frederic, MD;  Location: [facility];  Service: Orthopedics;  Laterality: Left;  TOTAL HIP ARTHROPLASTY Left 08/24/2022  Procedure: OPEN REDUCTION, HEAD AND LINER EXCHANGE;  Surgeon: Samson Frederic, MD;  Location: [facility];  Service: Orthopedics;  Laterality: Left; Gwynneth Aliment, M.A., CF-SLP Speech Language Pathology, Acute Rehabilitation Services Secure Chat preferred [phone] 04/12/2023, 2:57 PM   ECHOCARDIOGRAM  LIMITED  Result Date: 04/12/2023    ECHOCARDIOGRAM LIMITED REPORT   Patient Name:   Donyell Ding. Date of Exam: 04/12/2023 Medical Rec #:  [id number]                 Height:       77.0 in Accession #:    [id number]                Weight:       192.5 lb Date of Birth:  03/01/1951                 BSA:          2.201 m Patient Age:    72 years                  BP:           129/74 mmHg Patient Gender: M                         HR:           76 bpm. Exam Location:  Inpatient Procedure: Limited Echo and Color Doppler Indications:    Bacteremia  History:        Patient has prior history of Echocardiogram examinations, most                 recent 01/16/2023. CAD and Previous Myocardial Infarction; Risk  Factors:Dyslipidemia and Diabetes.  Sonographer:    Milbert Coulter Referring Phys: Belia Heman COMER IMPRESSIONS  1. No obvious vegetations, but valves not seen well enough to rule out. Would recommended TEE if clinically indicated.  2. Left ventricular ejection fraction, by estimation, is 65 to 70%. The left ventricle has normal function.  3. Right ventricular systolic function is normal. The right ventricular size is normal.  4. Trivial mitral valve regurgitation.  5. Aortic valve regurgitation is not visualized.  6. The inferior vena cava is normal in size with greater than 50% respiratory variability, suggesting right atrial pressure of 3 mmHg. FINDINGS  Left Ventricle: Left ventricular ejection fraction, by estimation, is 65 to 70%. The left ventricle has normal function. The left ventricular internal cavity size was normal in size. There is no left ventricular hypertrophy. Right Ventricle: The right ventricular size is normal. Right vetricular wall thickness was not assessed. Right ventricular systolic function is normal. Left Atrium: Left atrial size was normal in size. Right Atrium: Right atrial size was normal in size. Pericardium: There is no evidence of pericardial effusion. Mitral Valve:  There is mild thickening of the mitral valve leaflet(s). Trivial mitral valve regurgitation. Tricuspid Valve: The tricuspid valve is normal in structure. Tricuspid valve regurgitation is not demonstrated. Aortic Valve: Aortic valve regurgitation is not visualized. Pulmonic Valve: The pulmonic valve was not well visualized. Pulmonic valve regurgitation is not visualized. Aorta: The aortic root is normal in size and structure. Venous: The inferior vena cava is normal in size with greater than 50% respiratory variability, suggesting right atrial pressure of 3 mmHg. IAS/Shunts: No atrial level shunt detected by color flow Doppler. Additional Comments: Color Doppler performed.  Dietrich Pates MD Electronically signed by Dietrich Pates MD Signature Date/Time: 04/12/2023/2:49:29 PM    Final    DG Chest Port 1 View  Result Date: 04/10/2023 CLINICAL DATA:  Cough and fever EXAM: PORTABLE CHEST 1 VIEW COMPARISON:  Chest x-ray 10/01/2022 FINDINGS: There is central peribronchial wall thickening with some interstitial and patchy opacities in both lower lungs. There is no focal lung consolidation, pleural effusion or pneumothorax. Cardiomediastinal silhouette is within normal limits. IMPRESSION: Central peribronchial wall thickening with some interstitial and patchy opacities in both lower lungs. Findings are nonspecific and could be seen in the setting of atypical/viral infection. Electronically Signed   By: Darliss Cheney M.D.   On: 04/10/2023 23:23     I have personally spent 51  minutes involved in face-to-face and non-face-to-face activities for this patient on the day of the visit. Professional time spent includes the following activities: Preparing to see the patient (review of tests), Obtaining and/or reviewing separately obtained history (admission/discharge record), Performing a medically appropriate examination and/or evaluation , Ordering medications/tests/procedures, referring and communicating with other health care  professionals, Documenting clinical information in the EMR, Independently interpreting results (not separately reported), Communicating results to the patient/family/caregiver, Counseling and educating the patient/family/caregiver and Care coordination (not separately reported).   Plan d/w requesting provider as well as ID pharm D  Note: This document was prepared using dragon voice recognition software and may include unintentional dictation errors.   Electronically signed by:   Odette Fraction, MD Infectious Disease Physician [facility] Pager: [phone]

## 2023-04-20 DIAGNOSIS — R7881 Bacteremia: Secondary | ICD-10-CM | POA: Diagnosis not present

## 2023-04-20 LAB — GLUCOSE, CAPILLARY
Glucose-Capillary: 132 mg/dL — ABNORMAL HIGH (ref 70–99)
Glucose-Capillary: 152 mg/dL — ABNORMAL HIGH (ref 70–99)
Glucose-Capillary: 153 mg/dL — ABNORMAL HIGH (ref 70–99)
Glucose-Capillary: 153 mg/dL — ABNORMAL HIGH (ref 70–99)
Glucose-Capillary: 162 mg/dL — ABNORMAL HIGH (ref 70–99)
Glucose-Capillary: 192 mg/dL — ABNORMAL HIGH (ref 70–99)

## 2023-04-20 NOTE — Progress Notes (Signed)
Micheal Moore.  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  At baseline the patient is reportedly dependent upon significant assistance in his care, but frequently does not take his medications, and is not ambulatory.  Following his admission the patient was found to be suffering with bacteremia with no clear source thus far having been elucidated.  Goals of Care:   Code Status: Full Code   DVT prophylaxis: enoxaparin (LOVENOX) injection 40 mg Start: 04/11/23 1000 SCDs Start: 04/11/23 0133  Interim Hx: Afebrile.  Vital signs stable.  PICC line has been placed.  Arrangements have been made for home IV antibiotic therapy.  The patient has no new complaints today.  I spoke with the patient and his twin sister at bedside at length.  I explained to the patient the medically he has stabilized and that we need to make a definitive plan for his discharge.  He tells me that he has been bed ridden since at least April of this year, but cannot tell me exactly why.  Digging into it further he relates a history of a complicated left hip replacement that required a redo and admits that he has had significant fear of falling again since that time.  It sounds as if he has simply decided to be bed ridden and that his continued lack of activity has led to severe generalized weakness.  Nonetheless the patient's sister and wife do not feel that he can be cared for at home where he lives with just his wife.  The patient tells me he would be willing to consider a rehab stay if this could be arranged at a local SNF.  I have asked physical therapy to reevaluate the patient as he is now willing  to work with them and allow them to assess the possibility of guiding him to the point of recovering his ambulatory status.  Assessment & Plan:  Polymicrobial bacteremia - MRSA and Providencia Continue empiric antibiotic as per ID recommendations -TTE revealed no vegetations -TEE without findings to suggest endocarditis/no vegetations -antibiotic agent and course as per ID -PICC line placed 8/2 -outpatient IV antibiotics prescribed and to be monitored by the ID team  Suspected acute aspiration pneumonia Perhaps in the setting of the patient's acute nausea and vomiting combined with his bedridden status he suffered an episode of aspiration leading to his bacteremia -has been more than adequately covered with his antibiotic regimen -clinically resolved  Acute nausea and vomiting Resolved  Chronic orthostatic hypotension Continue usual midodrine and fludrocortisone  History of CAD Status post STEMI with stent placement 01/15/2023 -no beta-blocker due to orthostatic hypotension -continue Brilinta and aspirin  Uncontrolled DM2 Patient admitted to using pen needles over and over and has been educated that this is not appropriate - A1c 13.19 December 2022 -CBG well-controlled at time of discharge  Normocytic anemia Likely due to above -hemoglobin stable at time of discharge with no evidence of active bleeding  PVD Asymptomatic during this hospital stay  Family Communication: Spoke with patient and his twin sister at bedside at length Disposition: See lengthy discussion above -consider candidacy for SNF rehab   Objective: Blood pressure (!) 143/68, pulse 98, temperature 98.2 F (36.8  C), temperature source Oral, resp. rate 19, height 6\' 5"  (1.956 m), weight 85.4 kg, SpO2 100%.  Intake/Output Summary (Last 24 hours) at 04/20/2023 1012 Last data filed at 04/20/2023 0500 Gross per 24 hour  Intake 64 ml  Output 900 ml  Net -836 ml   Filed Weights   04/16/23 0500 04/17/23 0448 04/19/23 1306   Weight: 85 kg 85.4 kg 85.4 kg    Examination: General: No acute respiratory distress Lungs: Clear to auscultation bilaterally  Cardiovascular: Regular rate and rhythm  Abdomen: Nontender, nondistended, soft, bowel sounds positive, no rebound Extremities: No significant edema bilateral lower extremities  CBC: Recent Labs  Lab 04/18/23 0302  WBC 8.4  HGB 11.8*  HCT 37.1*  MCV 95.9  PLT 317   Basic Metabolic Panel: Recent Labs  Lab 04/17/23 0434 04/18/23 0302 04/20/23 0453  NA 135 135 137  K 4.2 4.0 3.6  CL 98 99 100  CO2 27 28 28   GLUCOSE 212* 154* 155*  BUN 18 16 14   CREATININE 0.66 0.60* 0.61  CALCIUM 9.0 8.8* 8.9   GFR: Estimated Creatinine Clearance: 100.8 mL/min (by C-G formula based on SCr of 0.61 mg/dL).   Scheduled Meds:  aspirin EC  81 mg Oral Daily   Chlorhexidine Gluconate Cloth  6 each Topical Daily   ciprofloxacin  750 mg Oral BID   empagliflozin  10 mg Oral Daily   enoxaparin (LOVENOX) injection  40 mg Subcutaneous Q24H   feeding supplement (GLUCERNA SHAKE)  237 mL Oral TID BM   fludrocortisone  0.2 mg Oral Daily   insulin aspart  0-9 Units Subcutaneous Q4H   midodrine  7.5 mg Oral TID WC   pantoprazole  40 mg Oral Daily   rosuvastatin  5 mg Oral Once per day on Monday Thursday   sodium chloride flush  10-40 mL Intracatheter Q12H   ticagrelor  90 mg Oral BID   Continuous Infusions:  DAPTOmycin (CUBICIN) 700 mg in sodium chloride 0.9 % IVPB 700 mg (04/19/23 1751)   promethazine (PHENERGAN) injection (IM or IVPB)       LOS: 9 days   Lonia Blood, MD [facility] Office  [phone] Pager - Text Page per Amion  If 7PM-7AM, please contact night-coverage per Amion 04/20/2023, 10:12 AM

## 2023-04-21 DIAGNOSIS — J690 Pneumonitis due to inhalation of food and vomit: Secondary | ICD-10-CM | POA: Diagnosis not present

## 2023-04-21 LAB — GLUCOSE, CAPILLARY
Glucose-Capillary: 151 mg/dL — ABNORMAL HIGH (ref 70–99)
Glucose-Capillary: 170 mg/dL — ABNORMAL HIGH (ref 70–99)
Glucose-Capillary: 177 mg/dL — ABNORMAL HIGH (ref 70–99)
Glucose-Capillary: 130 mg/dL — ABNORMAL HIGH (ref 70–99)
Glucose-Capillary: 213 mg/dL — ABNORMAL HIGH (ref 70–99)

## 2023-04-21 MED ORDER — SENNOSIDES-DOCUSATE SODIUM 8.6-50 MG PO TABS
1.0000 | ORAL_TABLET | Freq: Two times a day (BID) | ORAL | Status: DC
  Administered 2023-04-21 – 2023-04-22 (×2): 1 via ORAL
  Filled 2023-04-21 (×2): qty 1

## 2023-04-21 NOTE — Evaluation (Signed)
Physical Therapy Evaluation Patient Details Name: Micheal Moore. MRN: [id number] DOB: 1951/01/14 Today's Date: 04/21/2023  History of Present Illness  Pt is a 72 y.o. male admitted 01/15/23 with STEMI, diabetic ketoacidosis, PVD, multiple pressure injuries. S/p PCI with DES to proximal RCA. PMH includes  CAD s/p PCI, syncope, orthostatic hypotension, diabetes mellitus type 2, DVT of right leg, peripheral artery disease, right elbow melanoma s/p excision, L THA x2 (due to complications)  Clinical Impression   Pt admitted secondary to problem above with deficits below. PTA patient was bedbound since April due to his fear of falling again (had 2 falls after his hip replacement). He has 2 men lift him into wheelchair when he needs to be transported to appointments (by medical transport). He rolls modified independent in his hospital bed and never sits on the EOB. Pt currently can roll with rails modified independent to both left and right. He refuses any other mobility, but wants to work on getting his LLE moving better. He agrees to acute PT but does not want HHPT because he doesn't want anyone coming to his house.  Anticipate patient may benefit from PT to address problems listed below. Will continue to follow acutely to maximize functional mobility independence.  NOTE; pt will need medical transport for discharge home.            If plan is discharge home, recommend the following: Two people to help with walking and/or transfers;A lot of help with bathing/dressing/bathroom;Assistance with cooking/housework;Assist for transportation;Help with stairs or ramp for entrance   Can travel by private vehicle        Equipment Recommendations None recommended by PT  Recommendations for Other Services       Functional Status Assessment Patient has not had a recent decline in their functional status     Precautions / Restrictions Precautions Precautions: Fall Precaution Comments: pt also  very fearful of falling Restrictions Weight Bearing Restrictions: No      Mobility  Bed Mobility Overal bed mobility: Modified Independent Bed Mobility: Rolling Rolling: Modified independent (Device/Increase time)         General bed mobility comments: Has hospital bed at home and uses rails. Refused attempting supine to sit; +2 total to scoot to [facility]    Transfers                   General transfer comment: pt refuses due to fear of falling    Ambulation/Gait                  Stairs            Wheelchair Mobility     Tilt Bed    Modified Rankin (Stroke Patients Only)       Balance                                             Pertinent Vitals/Pain Pain Assessment Faces Pain Scale: Hurts little more Pain Location: left thigh Pain Descriptors / Indicators: Guarding Pain Intervention(s): Limited activity within patient's tolerance    Home Living Family/patient expects to be discharged to:: Private residence Living Arrangements: Spouse/significant other;Children Available Help at Discharge: Family;Available 24 hours/day (wife works, wife's son with pt when wife is at work) Type of Home: House Home Access: Stairs to enter   [employer] of Steps: 2 to the [employer] and  one from porch into home   Home Layout: One level Home Equipment: Agricultural consultant (2 wheels);Wheelchair - manual;Hospital bed      Prior Function Prior Level of Function : Needs assist             Mobility Comments: Per pt report and medical record, he's been bedbound since April, uses public transportation to get to appointments & when he has to get out of bed to w/c he has 2 guys lift him to w/c. Pt reports he is able to assist with rolling prior to admission. ADLs Comments: Pt uses bathroom in bed (does not use bed pan) & wife assists with peri hygiene.     Hand Dominance   Dominant Hand: Right    Extremity/Trunk Assessment   Upper  Extremity Assessment Upper Extremity Assessment: Defer to OT evaluation    Lower Extremity Assessment Lower Extremity Assessment: RLE deficits/detail;LLE deficits/detail RLE Deficits / Details: AROM grossly WFL (likely lacking DF); strength hip flexion at least 3/5 LLE Deficits / Details: AROM hip and knee ~15 degrees flexion (AAROM same with pt guarding and reporting pain in thigh before any resistance from joints felt); hip flexion 2+ (able to lift leg enough to roll himself over to the right)       Communication   Communication: No difficulties  Cognition Arousal/Alertness: Awake/alert Behavior During Therapy: WFL for tasks assessed/performed Overall Cognitive Status: No family/caregiver present to determine baseline cognitive functioning                                 General Comments: oriented to name, DOB, place, month and year        General Comments General comments (skin integrity, edema, etc.): Pt reports 2 falls following hip replacement and has fear of falling    Exercises     Assessment/Plan    PT Assessment Patient needs continued PT services  PT Problem List Decreased strength;Decreased range of motion;Decreased activity tolerance;Decreased mobility       PT Treatment Interventions Therapeutic activities;Therapeutic exercise;Patient/family education    PT Goals (Current goals can be found in the Care Plan section)  Acute Rehab PT Goals Patient Stated Goal: to improve ability to lift his left leg PT Goal Formulation: With patient Time For Goal Achievement: 05/05/23 Potential to Achieve Goals: Fair    Frequency Min 1X/week     Co-evaluation               AM-PAC PT "6 Clicks" Mobility  Outcome Measure Help needed turning from your back to your side while in a flat bed without using bedrails?: A Little (uses rails) Help needed moving from lying on your back to sitting on the side of a flat bed without using bedrails?: Total Help  needed moving to and from a bed to a chair (including a wheelchair)?: Total Help needed standing up from a chair using your arms (e.g., wheelchair or bedside chair)?: Total Help needed to walk in hospital room?: Total Help needed climbing 3-5 steps with a railing? : Total 6 Click Score: 8    End of Session   Activity Tolerance: Patient tolerated treatment well Patient left: in bed;with call bell/phone within reach;with bed alarm set;Other (comment) (with MD)   PT Visit Diagnosis: Repeated falls (R29.6);Muscle weakness (generalized) (M62.81)    Time: 1105-1130 PT Time Calculation (min) (ACUTE ONLY): 25 min   Charges:   PT Evaluation $PT Eval Low Complexity: 1  Low   PT General Charges $$ ACUTE PT VISIT: 1 Visit          Jerolyn Center, PT Acute Rehabilitation Services  Office [phone]   Zena Amos 04/21/2023, 12:31 PM

## 2023-04-21 NOTE — Evaluation (Signed)
Occupational Therapy Evaluation and Discharge Patient Details Name: Micheal Moore. MRN: [id number] DOB: 08-Aug-1951 Today's Date: 04/21/2023   History of Present Illness Pt is a 72 y.o. male admitted 01/15/23 with STEMI, diabetic ketoacidosis, PVD, multiple pressure injuries. S/p PCI with DES to proximal RCA. PMH includes  CAD s/p PCI, syncope, orthostatic hypotension, diabetes mellitus type 2, DVT of right leg, peripheral artery disease, right elbow melanoma s/p excision, L THA x2 (due to complications)   Clinical Impression   Pt has been bedbound and requiring assistance for ADLs since April 2024 due to fear of falling secondary to two falls after hip replacement. Pt currently presents with generalized B UE weakness, decreased activity tolerance, and cognitive deficits. Pt is likely near baseline PLOF. Pt currently demonstrates ability to perform UB ADLs Independent to Min guard assist from bed level, LB ADLs with Mod to Max assist from bed level, and rolling in the bed with Mod I. Pt refuses sitting EOB or OOB activity this day and states he is content remaining at current level of functioning with assist of family for ADLs at bed level. Pt has good rehab potential to address deficits and increase safety and independence with ADLs and functional transfers. However, pt refuses skilled OT services acutely or in the home at this time. OT signing off at this time secondary to pt refusal.      Recommendations for follow up therapy are one component of a multi-disciplinary discharge planning process, led by the attending physician.  Recommendations may be updated based on patient status, additional functional criteria and insurance authorization.   Assistance Recommended at Discharge Frequent or constant Supervision/Assistance  Patient can return home with the following Two people to help with walking and/or transfers;A lot of help with bathing/dressing/bathroom;Assistance with  cooking/housework;Direct supervision/assist for medications management;Direct supervision/assist for financial management;Assist for transportation;Help with stairs or ramp for entrance    Functional Status Assessment  Patient has not had a recent decline in their functional status  Equipment Recommendations  None recommended by OT    Recommendations for Other Services       Precautions / Restrictions Precautions Precautions: Fall Precaution Comments: pt also very fearful of falling Restrictions Weight Bearing Restrictions: No      Mobility Bed Mobility Overal bed mobility: Modified Independent Bed Mobility: Rolling Rolling: Modified independent (Device/Increase time)         General bed mobility comments: Has hospital bed at home and uses rails. Refused attempting supine to sit; +2 total to scoot to [facility]    Transfers                   General transfer comment: pt refuses due to fear of falling      Balance                                           ADL either performed or assessed with clinical judgement   ADL Overall ADL's : Needs assistance/impaired Eating/Feeding: Independent;Bed level   Grooming: Independent;Bed level   Upper Body Bathing: Min guard;Bed level   Lower Body Bathing: Moderate assistance;Bed level   Upper Body Dressing : Set up;Bed level   Lower Body Dressing: Maximal assistance;Bed level     Toilet Transfer Details (indicate cue type and reason): Pt declined this session Toileting- Clothing Manipulation and Hygiene: Maximal assistance;Bed level  General ADL Comments: Per MD and chart review, pt has largely been bedbound since April 2024 due to fear of falling and has required assistance with ADLs since that time. Pt presents with decreased activity tolerance during functional activities from bed level but is likely near his baseline PLOF.      Vision Baseline Vision/History: 1 Wears glasses (distance,  readers) Patient Visual Report: No change from baseline;Other (comment) (Glasses are broken)       Perception     Praxis      Pertinent Vitals/Pain Pain Assessment Pain Assessment: Faces Faces Pain Scale: Hurts little more Pain Location: left thigh Pain Descriptors / Indicators: Guarding Pain Intervention(s): Limited activity within patient's tolerance, Monitored during session     Hand Dominance Right   Extremity/Trunk Assessment Upper Extremity Assessment Upper Extremity Assessment: Generalized weakness   Lower Extremity Assessment Lower Extremity Assessment: Defer to PT evaluation       Communication Communication Communication: No difficulties   Cognition Arousal/Alertness: Awake/alert Behavior During Therapy: WFL for tasks assessed/performed Overall Cognitive Status: No family/caregiver present to determine baseline cognitive functioning                                 General Comments: AAOx4 and pleasant throughout session. However, pt presents with impaired short-term memory, poor insight into deficits, poor safety awareness, and inconsistant ability to follow 1 step commands with increased time and cues.     General Comments  Pt reports 2 falls following hip replacement and has fear of falling. Pt refuses skilled OT services at this time secondary to pt report he "has been through this before" following hip replacement and reporting he is not interested in increasing independence with ADLs or functional transfers, sitting EOB/OOB activity, or participating in therapeutic exercises from bed level to increase strength and activity tolerance.    Exercises     Shoulder Instructions      Home Living Family/patient expects to be discharged to:: Private residence Living Arrangements: Spouse/significant other;Children Available Help at Discharge: Family;Available 24 hours/day (wife works, wife's son with pt when wife is at work) Type of Home:  House Home Access: Stairs to enter Secretary/administrator of Steps: 2 to the poarch and one from porch into home   Home Layout: One level     Bathroom Shower/Tub: Chief Strategy Officer: Standard     Home Equipment: Agricultural consultant (2 wheels);Wheelchair - manual;Hospital bed          Prior Functioning/Environment Prior Level of Function : Needs assist             Mobility Comments: Per pt report and medical record, he's been bedbound since April, uses public transportation to get to appointments & when he has to get out of bed to w/c he has 2 guys lift him to w/c. Pt reports he is able to assist with rolling prior to admission. ADLs Comments: Pt uses bathroom in bed (does not use bed pan) & wife assists with peri hygiene, bathing, and dressing.        OT Problem List:        OT Treatment/Interventions:      OT Goals(Current goals can be found in the care plan section) Acute Rehab OT Goals Patient Stated Goal: To return home with assistance of family and to not participate in skilled OT services acutely or in the home  OT Frequency:  (Pt refuses skilled OT  services at this time.)    Co-evaluation              AM-PAC OT "6 Clicks" Daily Activity     Outcome Measure Help from another person eating meals?: None Help from another person taking care of personal grooming?: None Help from another person toileting, which includes using toliet, bedpan, or urinal?: A Lot Help from another person bathing (including washing, rinsing, drying)?: A Lot Help from another person to put on and taking off regular upper body clothing?: A Little Help from another person to put on and taking off regular lower body clothing?: A Lot 6 Click Score: 17   End of Session Nurse Communication: Mobility status;Other (comment) (Pt refuses participation in acute skilled OT services at this time.)  Activity Tolerance: Patient tolerated treatment well Patient left: in bed;with call  bell/phone within reach;Other (comment) (MD present)  OT Visit Diagnosis: Other abnormalities of gait and mobility (R26.89);Muscle weakness (generalized) (M62.81);History of falling (Z91.81);Repeated falls (R29.6)                Time: 1105-1130 OT Time Calculation (min): 25 min Charges:  OT General Charges $OT Visit: 1 Visit OT Evaluation $OT Eval Low Complexity: 1 Low   "Kyle" M., OTR/L, MA Acute Rehab [phone]   Lendon Colonel 04/21/2023, 4:52 PM

## 2023-04-21 NOTE — Progress Notes (Signed)
Micheal Moore.  YQM:[id number] DOB: 27-Jan-1951 DOA: 04/10/2023 PCP: [name], MD    Brief Narrative:  72 year old with a history of CAD status post PCI, recurrent syncopal spells, orthostatic hypotension, DM2, right lower extremity DVT, PAD, and melanoma of the right elbow status post excision who presented to the hospital 7/24 with the acute onset of nausea and vomiting.  At baseline the patient is reportedly dependent upon significant assistance in his care, but frequently does not take his medications, and is not ambulatory.  Following his admission the patient was found to be suffering with bacteremia with no clear source thus far having been elucidated.  Goals of Care:   Code Status: Full Code   DVT prophylaxis: enoxaparin (LOVENOX) injection 40 mg Start: 04/11/23 1000 SCDs Start: 04/11/23 0133  Interim Hx: Afebrile.  Vital signs stable.  CBG reasonably controlled.  Despite our long discussion yesterday concerning rehab, the patient has declared today that he is going home.  Physical therapy and Occupational Therapy actually found that he is more functional than originally fault and feel that with help from family it is not unreasonable to allow him to go home.  He has no new complaints today.  Assessment & Plan:  Polymicrobial bacteremia - MRSA and Providencia Continue empiric antibiotic as per ID recommendations -TTE revealed no vegetations -TEE without findings to suggest endocarditis/no vegetations -antibiotic agent and course as per ID -PICC line placed 8/2 -outpatient IV antibiotics prescribed and to be monitored by the ID team  Suspected acute aspiration pneumonia - resolved  Perhaps in the setting of the patient's acute nausea and vomiting combined with his bedridden status he suffered an episode of aspiration leading to his bacteremia -has been more than adequately covered with his antibiotic regimen -clinically resolved  Acute nausea and  vomiting Resolved  Chronic orthostatic hypotension Continue usual midodrine and fludrocortisone  History of CAD Status post STEMI with stent placement 01/15/2023 -no beta-blocker due to orthostatic hypotension -continue Brilinta and aspirin  Uncontrolled DM2 Patient admitted to using pen needles over and over and has been educated that this is not appropriate - A1c 13.19 December 2022 -CBG well-controlled presently   Normocytic anemia Likely due to above -hemoglobin stable with no evidence of active bleeding  PVD Asymptomatic during this hospital stay  Family Communication: No family present at time of exam Disposition: Discharge home 8/5   Objective: Blood pressure (!) 156/95, pulse 95, temperature 98.1 F (36.7 C), temperature source Oral, resp. rate 19, height 6\' 5"  (1.956 m), weight 85.4 kg, SpO2 100%.  Intake/Output Summary (Last 24 hours) at 04/21/2023 0957 Last data filed at 04/21/2023 0900 Gross per 24 hour  Intake 304 ml  Output 2850 ml  Net -2546 ml   Filed Weights   04/16/23 0500 04/17/23 0448 04/19/23 1306  Weight: 85 kg 85.4 kg 85.4 kg    Examination: General: No acute respiratory distress Lungs: Clear to auscultation bilaterally  Cardiovascular: Regular rate and rhythm  Abdomen: NT/ND, soft, BS positive Extremities: No significant edema bilateral lower extremities  CBC: Recent Labs  Lab 04/18/23 0302  WBC 8.4  HGB 11.8*  HCT 37.1*  MCV 95.9  PLT 317   Basic Metabolic Panel: Recent Labs  Lab 04/17/23 0434 04/18/23 0302 04/20/23 0453  NA 135 135 137  K 4.2 4.0 3.6  CL 98 99 100  CO2 27 28 28   GLUCOSE 212* 154* 155*  BUN 18 16 14   CREATININE 0.66 0.60* 0.61  CALCIUM 9.0 8.8* 8.9  GFR: Estimated Creatinine Clearance: 100.8 mL/min (by C-G formula based on SCr of 0.61 mg/dL).   Scheduled Meds:  aspirin EC  81 mg Oral Daily   Chlorhexidine Gluconate Cloth  6 each Topical Daily   ciprofloxacin  750 mg Oral BID   empagliflozin  10 mg Oral  Daily   enoxaparin (LOVENOX) injection  40 mg Subcutaneous Q24H   feeding supplement (GLUCERNA SHAKE)  237 mL Oral TID BM   fludrocortisone  0.2 mg Oral Daily   insulin aspart  0-9 Units Subcutaneous Q4H   midodrine  7.5 mg Oral TID WC   pantoprazole  40 mg Oral Daily   rosuvastatin  5 mg Oral Once per day on Monday Thursday   sodium chloride flush  10-40 mL Intracatheter Q12H   ticagrelor  90 mg Oral BID   Continuous Infusions:  DAPTOmycin (CUBICIN) 700 mg in sodium chloride 0.9 % IVPB 700 mg (04/20/23 1242)   promethazine (PHENERGAN) injection (IM or IVPB)       LOS: 10 days   Lonia Blood, MD [facility] Office  [phone] Pager - Text Page per Loretha Stapler  If 7PM-7AM, please contact night-coverage per Amion 04/21/2023, 9:57 AM

## 2023-04-22 ENCOUNTER — Other Ambulatory Visit (HOSPITAL_COMMUNITY): Payer: Self-pay

## 2023-04-22 ENCOUNTER — Encounter (HOSPITAL_COMMUNITY): Payer: Self-pay | Admitting: Cardiology

## 2023-04-22 DIAGNOSIS — R7881 Bacteremia: Secondary | ICD-10-CM | POA: Diagnosis not present

## 2023-04-22 LAB — GLUCOSE, CAPILLARY
Glucose-Capillary: 175 mg/dL — ABNORMAL HIGH (ref 70–99)
Glucose-Capillary: 183 mg/dL — ABNORMAL HIGH (ref 70–99)
Glucose-Capillary: 188 mg/dL — ABNORMAL HIGH (ref 70–99)
Glucose-Capillary: 194 mg/dL — ABNORMAL HIGH (ref 70–99)

## 2023-04-22 MED ORDER — CIPROFLOXACIN HCL 750 MG PO TABS
750.0000 mg | ORAL_TABLET | Freq: Two times a day (BID) | ORAL | 0 refills | Status: DC
  Filled 2023-04-22: qty 11, 6d supply, fill #0

## 2023-04-22 MED ORDER — INSULIN DETEMIR 100 UNIT/ML FLEXPEN
20.0000 [IU] | PEN_INJECTOR | Freq: Every day | SUBCUTANEOUS | 0 refills | Status: DC
  Filled 2023-04-22: qty 15, 75d supply, fill #0

## 2023-04-22 MED ORDER — HEPARIN SOD (PORK) LOCK FLUSH 100 UNIT/ML IV SOLN
250.0000 [IU] | INTRAVENOUS | Status: AC | PRN
  Administered 2023-04-22: 250 [IU]

## 2023-04-22 MED ORDER — INSULIN ASPART 100 UNIT/ML FLEXPEN
8.0000 [IU] | PEN_INJECTOR | Freq: Three times a day (TID) | SUBCUTANEOUS | 0 refills | Status: DC
  Filled 2023-04-22: qty 15, 30d supply, fill #0

## 2023-04-22 MED ORDER — ACETAMINOPHEN 325 MG PO TABS: 650.0000 mg | ORAL_TABLET | Freq: Four times a day (QID) | ORAL | Status: DC | PRN

## 2023-04-22 NOTE — Discharge Summary (Addendum)
DISCHARGE SUMMARY  Micheal Moore.  MR#: [id number]  DOB:December 14, 1950  Date of Admission: 04/10/2023 Date of Discharge: 04/22/2023  Attending Physician: Silvestre Gunner, MD  Patient's JYN:WGNFA, Micheal Senior, MD  Disposition: D/C home   Follow-up Appts:  Follow-up Information     [facility] Follow up.   Specialty: Home Health Services Why: Someone will call you to schedule resumption of care. Contact information: [address] [place] [address] [phone]         [name], MD Follow up in 7 day(s).   Specialty: Endocrinology Contact information: [address] [place] [address] [phone]                 Tests Needing Follow-up: -Monitor PICC line for evidence of malfunction or infection -Monitor clinical course to assure there are no relapsing symptoms of active infection -Continue to encourage patient to reconsider aggressive physical therapy to regain his ambulatory status -Close follow-up with patient CBGs will be required -he required very little insulin during his hospital stay on a restricted diet, but is on a significantly higher insulin regimen at home suggesting dietary noncompliance -insulin regimen from home was decreased at time of discharge in hopes of avoiding hypoglycemia should patient choose to more closely comply with diabetic diet after discharge  Discharge Diagnoses: Polymicrobial bacteremia - MRSA and Providencia Suspected acute aspiration pneumonia - resolved  Acute nausea and vomiting Chronic orthostatic hypotension History of CAD Uncontrolled DM2 Normocytic anemia PVD  Initial presentation: 72 year old with a history of CAD status post PCI, recurrent syncopal spells, orthostatic hypotension, DM2, right lower extremity DVT, PAD, and melanoma of the right elbow status post excision who presented to the hospital 7/24 with the acute onset of nausea and vomiting.  At baseline the  patient is reportedly dependent upon significant assistance in his care, but frequently does not take his medications, and is not ambulatory.   Following his admission the patient was found to be suffering with bacteremia with no clear source thus far having been elucidated.  Hospital Course:  Polymicrobial bacteremia - MRSA and Providencia Continuing antibiotic tx as per ID recommendations, with arrangements made to continue IV ab at home via PICC under ID Team guidance  -TTE revealed no vegetations -TEE without findings to suggest endocarditis/no vegetations -antibiotic agent and course as per ID -PICC line placed 8/2 -outpatient IV antibiotics prescribed and to be monitored by the ID team in f/u    Suspected acute aspiration pneumonia - resolved  Perhaps in the setting of the patient's acute nausea and vomiting combined with his bedridden status he suffered an episode of aspiration leading to his bacteremia -has been more than adequately covered with his antibiotic regimen -clinically resolved at time of discharge   Acute nausea and vomiting Resolved at time of discharge   Chronic orthostatic hypotension Continue usual midodrine and fludrocortisone   History of CAD Status post STEMI with stent placement 01/15/2023 -no beta-blocker due to orthostatic hypotension -continue Brilinta and aspirin   Uncontrolled DM2 Patient admitted to using pen needles over and over and has been educated that this is not appropriate - A1c 13.19 December 2022 - CBG well-controlled at time of discharge and during hospital stay -choosing home regimen is difficult as patient required very little insulin during his hospital stay but is on a relatively moderate to high dose at home most likely due to very poor dietary compliance -it is suspected that the patient will continue to follow a poor  diet at home and therefore it is not felt appropriate to send him home on a very low-dose of insulin -likewise if he should choose to  be more compliant with diet there is concern that his previous insulin dose would lead to hypoglycemia -as a result he is being discharged home on a reduced dose from his prior home dose which still represents more insulin than he required in the hospital -he is advised to avoid long-acting insulin as well as short acting dosing if his CBGs are consistently <120 -he is advised to follow his CBGs very closely and report his numbers to his primary care physician in follow-up   Normocytic anemia Likely due to above -hemoglobin stable with no evidence of active bleeding   PVD Asymptomatic during this hospital stay  Ambulatory dysfunction The patient is able to move both lower extremities -he has a complicated history of bilateral hip repairs, apparently requiring a redo on the left shortly after the initial surgery -he appears to have a severe fear of falling again and injuring himself, and therefore has essentially chosen to be bedridden -I feel the patient could likely regain his ambulatory status with aggressive physical therapy/Occupational Therapy, and I offered him an SNF rehab stay to attempt to accomplish this -the patient however is not interested and chooses to remain nonambulatory/bedridden -he has been counseled as to the fact that this will decrease his life expectancy and lead to other complicating factors but he persisted and his desire to avoid aggressive Physical Therapy/Occupational Therapy -he furthermore even refuses to allow home health therapy to work with him -while I do not agree with these decisions he is not confused and is competent to make his own healthcare decisions  Allergies as of 04/22/2023   No Known Allergies      Medication List     STOP taking these medications    insulin glargine 100 UNIT/ML Solostar Pen Commonly known as: LANTUS       TAKE these medications    acetaminophen 325 MG tablet Commonly known as: TYLENOL Take 2 tablets (650 mg total) by mouth  every 6 (six) hours as needed for mild pain, fever or headache.   aspirin EC 81 MG tablet Take 1 tablet (81 mg total) by mouth daily. Swallow whole.   Blood Glucose Monitoring Suppl Devi 1 each by Does not apply route 3 (three) times daily. May dispense any manufacturer covered by patient's insurance.   BLOOD GLUCOSE TEST STRIPS Strp 1 each by Does not apply route 3 (three) times daily. Use as directed to check blood sugar. May dispense any manufacturer covered by patient's insurance and fits patient's device.   Brilinta 90 MG Tabs tablet Generic drug: ticagrelor Take 90 mg by mouth 2 (two) times daily.   ciprofloxacin 750 MG tablet Commonly known as: CIPRO Take 1 tablet (750 mg total) by mouth 2 (two) times daily for 11 doses.   daptomycin IVPB Commonly known as: CUBICIN Inject 700 mg into the vein daily for 21 days. Indication:  MRSA bacteremia First Dose: Yes Last Day of Therapy:  05/10/23 Labs - Once weekly:  CBC/D, BMP, and CPK Labs - Once weekly: ESR and CRP Method of administration: IV Push Method of administration may be changed at the discretion of home infusion pharmacist based upon assessment of the patient and/or caregiver's ability to self-administer the medication ordered.   Ensure Max Protein Liqd Take 330 mLs (11 oz total) by mouth 2 (two) times daily. What changed: when to  take this   fludrocortisone 0.1 MG tablet Commonly known as: FLORINEF Take 2 tablets (0.2 mg total) by mouth daily.   Glucagon Emergency 1 MG Kit Inject 1 mg into the skin as needed for up to 2 doses (Severe low blood sugar).   insulin aspart 100 UNIT/ML FlexPen Commonly known as: NOVOLOG Inject 8 Units into the skin 3 (three) times daily with meals. Only take if eating a meal AND Blood Glucose (BG) is 80 or higher.   insulin detemir 100 UNIT/ML FlexPen Commonly known as: LEVEMIR Inject 20 Units into the skin at bedtime. DO NOT TAKE if your blood sugars and consistently running <  120 Start taking on: April 23, 2023   Jardiance 10 MG Tabs tablet Generic drug: empagliflozin Take 10 mg by mouth daily.   Lancet Device Misc 1 each by Does not apply route 3 (three) times daily. May dispense any manufacturer covered by patient's insurance.   Lancets Misc 1 each by Does not apply route 3 (three) times daily. Use as directed to check blood sugar. May dispense any manufacturer covered by patient's insurance and fits patient's device.   midodrine 5 MG tablet Commonly known as: PROAMATINE Take 0.5 tablets (2.5 mg total) by mouth 3 (three) times daily with meals. What changed: how much to take   Pen Needles 31G X 5 MM Misc 1 each by Does not apply route 3 (three) times daily. May dispense any manufacturer covered by patient's insurance.   polyethylene glycol 17 g packet Commonly known as: MIRALAX / GLYCOLAX Take 17 g by mouth daily. What changed:  when to take this reasons to take this   rosuvastatin 5 MG tablet Commonly known as: CRESTOR Take 5 mg by mouth 2 (two) times a week.               Discharge Care Instructions  (From admission, onward)           Start     Ordered   04/19/23 0000  Change dressing on IV access line weekly and PRN  (Home infusion instructions - [facility] )        04/19/23 1638            Day of Discharge BP (!) 169/88 (BP Location: Left Arm)   Pulse 95   Temp 98 F (36.7 C) (Oral)   Resp 18   Ht 6\' 5"  (1.956 m)   Wt 85.4 kg   SpO2 100%   BMI 22.33 kg/m   Physical Exam: General: No acute respiratory distress Lungs: Clear to auscultation bilaterally without wheezes or crackles Cardiovascular: Regular rate and rhythm without murmur gallop or rub normal S1 and S2 Abdomen: Nontender, nondistended, soft, bowel sounds positive, no rebound, no ascites, no appreciable mass Extremities: 1+ B LE edema w/o erythema   Basic Metabolic Panel: Recent Labs  Lab 04/16/23 0011 04/17/23 0434 04/18/23 0302  04/20/23 0453  NA 138 135 135 137  K 3.8 4.2 4.0 3.6  CL 103 98 99 100  CO2 26 27 28 28   GLUCOSE 186* 212* 154* 155*  BUN 18 18 16 14   CREATININE 0.73 0.66 0.60* 0.61  CALCIUM 8.9 9.0 8.8* 8.9    CBC: Recent Labs  Lab 04/18/23 0302  WBC 8.4  HGB 11.8*  HCT 37.1*  MCV 95.9  PLT 317    Time spent in discharge (includes decision making & examination of pt): 35 minutes  04/22/2023, 10:31 AM   Lonia Blood, MD [facility] Office  [phone]

## 2023-04-22 NOTE — Plan of Care (Signed)
  Problem: Education: Goal: Knowledge of General Education information will improve Description: Including pain rating scale, medication(s)/side effects and non-pharmacologic comfort measures Outcome: Adequate for Discharge   Problem: Health Behavior/Discharge Planning: Goal: Ability to manage health-related needs will improve Outcome: Adequate for Discharge   Problem: Clinical Measurements: Goal: Ability to maintain clinical measurements within normal limits will improve Outcome: Adequate for Discharge Goal: Will remain free from infection Outcome: Adequate for Discharge Goal: Diagnostic test results will improve Outcome: Adequate for Discharge Goal: Respiratory complications will improve Outcome: Adequate for Discharge Goal: Cardiovascular complication will be avoided Outcome: Adequate for Discharge   Problem: Activity: Goal: Risk for activity intolerance will decrease Outcome: Adequate for Discharge   Problem: Coping: Goal: Level of anxiety will decrease Outcome: Adequate for Discharge   Problem: Elimination: Goal: Will not experience complications related to bowel motility Outcome: Adequate for Discharge Goal: Will not experience complications related to urinary retention Outcome: Adequate for Discharge   Problem: Pain Managment: Goal: General experience of comfort will improve Outcome: Adequate for Discharge   Problem: Skin Integrity: Goal: Risk for impaired skin integrity will decrease Outcome: Adequate for Discharge   Problem: Activity: Goal: Ability to tolerate increased activity will improve Outcome: Adequate for Discharge   Problem: Clinical Measurements: Goal: Ability to maintain a body temperature in the normal range will improve Outcome: Adequate for Discharge   Problem: Respiratory: Goal: Ability to maintain adequate ventilation will improve Outcome: Adequate for Discharge Goal: Ability to maintain a clear airway will improve Outcome: Adequate for  Discharge   Problem: Education: Goal: Ability to describe self-care measures that may prevent or decrease complications (Diabetes Survival Skills Education) will improve Outcome: Adequate for Discharge Goal: Individualized Educational Video(s) Outcome: Adequate for Discharge   Problem: Coping: Goal: Ability to adjust to condition or change in health will improve Outcome: Adequate for Discharge   Problem: Fluid Volume: Goal: Ability to maintain a balanced intake and output will improve Outcome: Adequate for Discharge   Problem: Health Behavior/Discharge Planning: Goal: Ability to identify and utilize available resources and services will improve Outcome: Adequate for Discharge Goal: Ability to manage health-related needs will improve Outcome: Adequate for Discharge   Problem: Metabolic: Goal: Ability to maintain appropriate glucose levels will improve Outcome: Adequate for Discharge   Problem: Nutritional: Goal: Maintenance of adequate nutrition will improve Outcome: Adequate for Discharge Goal: Progress toward achieving an optimal weight will improve Outcome: Adequate for Discharge   Problem: Skin Integrity: Goal: Risk for impaired skin integrity will decrease Outcome: Adequate for Discharge   Problem: Tissue Perfusion: Goal: Adequacy of tissue perfusion will improve Outcome: Adequate for Discharge

## 2023-04-22 NOTE — TOC Transition Note (Signed)
Transition of Care [facility]) - CM/SW Discharge Note   Patient Details  Name: Micheal Moore. MRN: [id number] Date of Birth: [birth year]  Transition of [facility]) CM/SW Contact:  Tom-Johnson, Hershal Coria, RN Phone Number: 04/22/2023, 1:54 PM   Clinical Narrative:     Patient is scheduled for discharge today.  Readmission Risk Assessment done. Home Health info, Outpatient f/u, hospital f/u and discharge instructions on AVS. Prescriptions sent to [facility] pharmacy and delivered to patient at bedside prior discharge. Pam with [facility] did home IV abx education with patient's wife [name] at bedside today.  PTAR scheduled to transport at discharge.  No further TOC needs noted.          Final next level of care: Home w Home Health Services Barriers to Discharge: Barriers Resolved   Patient Goals and CMS Choice CMS Medicare.gov Compare Post Acute Care list provided to:: Patient Choice offered to / list presented to : Patient, Spouse  Discharge Placement                  Patient to be transferred to facility by: PTAR Name of family member notified: Wife- Marie    Discharge Plan and Services Additional resources added to the After Visit Summary for                  DME Arranged: N/A DME Agency: NA       HH Arranged: PT, RN, IV Antibiotics, Refused SNF HH Agency: [facility], [facility] for Home health PT/RN and [facility] for Home IV abx infusion.) Date [facility] Agency Contacted: 04/19/23 Time HH Agency Contacted: [id number] Representative spoke with at [facility] Agency: Pam with [facility] and Kandee Keen with [place]  Social Determinants of Health (SDOH) Interventions SDOH Screenings   Food Insecurity: No Food Insecurity (01/16/2023)  Housing: Low Risk  (01/16/2023)  Transportation Needs: No Transportation Needs (01/16/2023)  Utilities: Not At Risk (01/16/2023)  Tobacco Use: High Risk (01/15/2023)     Readmission Risk Interventions    04/12/2023    4:47 PM 07/25/2022     2:42 PM  Readmission Risk Prevention Plan  Transportation Screening Complete   PCP or Specialist Appt within 5-7 Days    PCP or Specialist Appt within 3-5 Days Complete   Home Care Screening    Medication Review (RN CM)    HRI or Home Care Consult Complete   Social Work Consult for Recovery Care Planning/Counseling Complete   Palliative Care Screening Not Applicable   Medication Review (RN Care Manager) Referral to Pharmacy      Information is confidential and restricted. Go to Review Flowsheets to unlock data.

## 2023-04-22 NOTE — Progress Notes (Signed)
Lory Edward Ilyas to be discharged home. Per MD order. Discussed prescription and and follow up appointments with the patient. Prescriptions given to patient, medication list explained in details. Patient verbalized understanding.    Skin clean, dry and intact without evidence of skin break down, no evidence of skin care tears noted. IV catheter discontinued intact. Site without signs and symptoms of complications. Dressing and pressure applied. Condom Catheter removed and peri care provided. Picc line flushed before discharged per protocol. Patient denied pain at the site currently. No complaints noted.   Patient free of drain and wound.    An after Visit Summary (AVS) was printed and given to the patient .   Patient escorted via bed and discharged home via [facility]    Pearson Grippe, RN

## 2023-04-24 ENCOUNTER — Observation Stay (HOSPITAL_COMMUNITY)
Admission: EM | Admit: 2023-04-24 | Discharge: 2023-05-04 | Disposition: A | Discharge status: Skilled Nursing Facility | Payer: Medicare HMO | Attending: Family Medicine | Admitting: Family Medicine

## 2023-04-24 ENCOUNTER — Other Ambulatory Visit: Payer: Self-pay

## 2023-04-24 ENCOUNTER — Encounter (HOSPITAL_COMMUNITY): Payer: Self-pay

## 2023-04-24 DIAGNOSIS — X58XXXA Exposure to other specified factors, initial encounter: Secondary | ICD-10-CM | POA: Insufficient documentation

## 2023-04-24 DIAGNOSIS — S72009A Fracture of unspecified part of neck of unspecified femur, initial encounter for closed fracture: Secondary | ICD-10-CM | POA: Diagnosis not present

## 2023-04-24 DIAGNOSIS — E559 Vitamin D deficiency, unspecified: Secondary | ICD-10-CM | POA: Insufficient documentation

## 2023-04-24 DIAGNOSIS — Z79899 Other long term (current) drug therapy: Secondary | ICD-10-CM | POA: Diagnosis not present

## 2023-04-24 DIAGNOSIS — E119 Type 2 diabetes mellitus without complications: Secondary | ICD-10-CM | POA: Insufficient documentation

## 2023-04-24 DIAGNOSIS — J690 Pneumonitis due to inhalation of food and vomit: Secondary | ICD-10-CM | POA: Diagnosis present

## 2023-04-24 DIAGNOSIS — Z85828 Personal history of other malignant neoplasm of skin: Secondary | ICD-10-CM | POA: Diagnosis not present

## 2023-04-24 DIAGNOSIS — R2689 Other abnormalities of gait and mobility: Secondary | ICD-10-CM | POA: Insufficient documentation

## 2023-04-24 DIAGNOSIS — R531 Weakness: Secondary | ICD-10-CM | POA: Diagnosis present

## 2023-04-24 DIAGNOSIS — I2510 Atherosclerotic heart disease of native coronary artery without angina pectoris: Secondary | ICD-10-CM | POA: Diagnosis present

## 2023-04-24 DIAGNOSIS — E1169 Type 2 diabetes mellitus with other specified complication: Secondary | ICD-10-CM | POA: Diagnosis present

## 2023-04-24 DIAGNOSIS — Z86718 Personal history of other venous thrombosis and embolism: Secondary | ICD-10-CM | POA: Diagnosis not present

## 2023-04-24 DIAGNOSIS — F17210 Nicotine dependence, cigarettes, uncomplicated: Secondary | ICD-10-CM | POA: Diagnosis not present

## 2023-04-24 DIAGNOSIS — Z96642 Presence of left artificial hip joint: Secondary | ICD-10-CM | POA: Diagnosis not present

## 2023-04-24 DIAGNOSIS — Z7982 Long term (current) use of aspirin: Secondary | ICD-10-CM | POA: Insufficient documentation

## 2023-04-24 DIAGNOSIS — I10 Essential (primary) hypertension: Secondary | ICD-10-CM | POA: Insufficient documentation

## 2023-04-24 DIAGNOSIS — Z794 Long term (current) use of insulin: Secondary | ICD-10-CM | POA: Insufficient documentation

## 2023-04-24 DIAGNOSIS — R262 Difficulty in walking, not elsewhere classified: Principal | ICD-10-CM

## 2023-04-24 LAB — CBC WITH DIFFERENTIAL/PLATELET
Abs Immature Granulocytes: 0.04 10*3/uL (ref 0.00–0.07)
Basophils Absolute: 0.1 10*3/uL (ref 0.0–0.1)
Basophils Relative: 1 %
Eosinophils Absolute: 0.4 10*3/uL (ref 0.0–0.5)
Eosinophils Relative: 4 %
HCT: 39.1 % (ref 39.0–52.0)
Hemoglobin: 12.3 g/dL — ABNORMAL LOW (ref 13.0–17.0)
Immature Granulocytes: 0 %
Lymphocytes Relative: 21 %
Lymphs Abs: 2 10*3/uL (ref 0.7–4.0)
MCH: 31.3 pg (ref 26.0–34.0)
MCHC: 31.5 g/dL (ref 30.0–36.0)
MCV: 99.5 fL (ref 80.0–100.0)
Monocytes Absolute: 0.4 10*3/uL (ref 0.1–1.0)
Monocytes Relative: 5 %
Neutro Abs: 6.7 10*3/uL (ref 1.7–7.7)
Neutrophils Relative %: 69 %
Platelets: 342 10*3/uL (ref 150–400)
RBC: 3.93 MIL/uL — ABNORMAL LOW (ref 4.22–5.81)
RDW: 14.3 % (ref 11.5–15.5)
WBC: 9.5 10*3/uL (ref 4.0–10.5)
nRBC: 0 % (ref 0.0–0.2)

## 2023-04-24 LAB — BASIC METABOLIC PANEL
Anion gap: 15 (ref 5–15)
BUN: 19 mg/dL (ref 8–23)
CO2: 22 mmol/L (ref 22–32)
Calcium: 9.2 mg/dL (ref 8.9–10.3)
Chloride: 101 mmol/L (ref 98–111)
Creatinine, Ser: 0.7 mg/dL (ref 0.61–1.24)
GFR, Estimated: >60 mL/min (ref >60)
Glucose, Bld: 192 mg/dL — ABNORMAL HIGH (ref 70–99)
Potassium: 3.8 mmol/L (ref 3.5–5.1)
Sodium: 138 mmol/L (ref 135–145)

## 2023-04-24 LAB — GLUCOSE, CAPILLARY
Glucose-Capillary: 193 mg/dL — ABNORMAL HIGH (ref 70–99)
Glucose-Capillary: 132 mg/dL — ABNORMAL HIGH (ref 70–99)
Glucose-Capillary: 137 mg/dL — ABNORMAL HIGH (ref 70–99)

## 2023-04-24 LAB — CBG MONITORING, ED: Glucose-Capillary: 193 mg/dL — ABNORMAL HIGH (ref 70–99)

## 2023-04-24 MED ORDER — INSULIN ASPART 100 UNIT/ML IJ SOLN
2.0000 [IU] | Freq: Three times a day (TID) | INTRAMUSCULAR | Status: DC
  Administered 2023-04-24 – 2023-05-01 (×21): 2 [IU] via SUBCUTANEOUS

## 2023-04-24 MED ORDER — INSULIN GLARGINE-YFGN 100 UNIT/ML ~~LOC~~ SOLN
10.0000 [IU] | Freq: Every day | SUBCUTANEOUS | Status: DC
  Administered 2023-04-24 – 2023-05-03 (×10): 10 [IU] via SUBCUTANEOUS
  Filled 2023-04-24 (×11): qty 0.1

## 2023-04-24 MED ORDER — IPRATROPIUM-ALBUTEROL 0.5-2.5 (3) MG/3ML IN SOLN: 3.0000 mL | RESPIRATORY_TRACT | Status: DC | PRN

## 2023-04-24 MED ORDER — FLUDROCORTISONE ACETATE 0.1 MG PO TABS
0.2000 mg | ORAL_TABLET | Freq: Every day | ORAL | Status: DC
  Administered 2023-04-24 – 2023-04-28 (×5): 0.2 mg via ORAL
  Filled 2023-04-24 (×5): qty 2

## 2023-04-24 MED ORDER — ACETAMINOPHEN 325 MG PO TABS: 650.0000 mg | ORAL_TABLET | Freq: Four times a day (QID) | ORAL | Status: DC | PRN

## 2023-04-24 MED ORDER — DAPTOMYCIN IV (FOR PTA / DISCHARGE USE ONLY): 700.0000 mg | INTRAVENOUS | Status: DC

## 2023-04-24 MED ORDER — ONDANSETRON HCL 4 MG PO TABS: 4.0000 mg | ORAL_TABLET | Freq: Four times a day (QID) | ORAL | Status: DC | PRN

## 2023-04-24 MED ORDER — ONDANSETRON HCL 4 MG/2ML IJ SOLN: 4.0000 mg | Freq: Four times a day (QID) | INTRAMUSCULAR | Status: DC | PRN

## 2023-04-24 MED ORDER — ACETAMINOPHEN 650 MG RE SUPP: 650.0000 mg | Freq: Four times a day (QID) | RECTAL | Status: DC | PRN

## 2023-04-24 MED ORDER — SODIUM CHLORIDE 0.9 % IV BOLUS
1000.0000 mL | Freq: Once | INTRAVENOUS | Status: AC
  Administered 2023-04-24: 1000 mL via INTRAVENOUS

## 2023-04-24 MED ORDER — TRAZODONE HCL 50 MG PO TABS
50.0000 mg | ORAL_TABLET | Freq: Every evening | ORAL | Status: DC | PRN
  Administered 2023-04-25 – 2023-05-03 (×5): 50 mg via ORAL
  Filled 2023-04-24 (×5): qty 1

## 2023-04-24 MED ORDER — SODIUM CHLORIDE 0.9 % IV SOLN
700.0000 mg | Freq: Every day | INTRAVENOUS | Status: DC
  Administered 2023-04-24 – 2023-05-04 (×11): 700 mg via INTRAVENOUS
  Filled 2023-04-24 (×11): qty 14

## 2023-04-24 MED ORDER — CIPROFLOXACIN HCL 750 MG PO TABS
750.0000 mg | ORAL_TABLET | Freq: Two times a day (BID) | ORAL | Status: AC
  Administered 2023-04-24 – 2023-04-25 (×2): 750 mg via ORAL
  Filled 2023-04-24 (×2): qty 1

## 2023-04-24 MED ORDER — SENNOSIDES-DOCUSATE SODIUM 8.6-50 MG PO TABS
1.0000 | ORAL_TABLET | Freq: Every evening | ORAL | Status: DC | PRN
  Administered 2023-04-25: 1 via ORAL
  Filled 2023-04-24: qty 1

## 2023-04-24 MED ORDER — INSULIN ASPART 100 UNIT/ML IJ SOLN: 0.0000 [IU] | Freq: Every day | INTRAMUSCULAR | Status: DC

## 2023-04-24 MED ORDER — METOPROLOL TARTRATE 5 MG/5ML IV SOLN: 5.0000 mg | INTRAVENOUS | Status: DC | PRN

## 2023-04-24 MED ORDER — INSULIN ASPART 100 UNIT/ML IJ SOLN
0.0000 [IU] | Freq: Three times a day (TID) | INTRAMUSCULAR | Status: DC
  Administered 2023-04-24: 2 [IU] via SUBCUTANEOUS
  Administered 2023-04-25 (×2): 1 [IU] via SUBCUTANEOUS
  Administered 2023-04-25: 2 [IU] via SUBCUTANEOUS
  Administered 2023-04-26: 1 [IU] via SUBCUTANEOUS
  Administered 2023-04-26 – 2023-04-27 (×3): 2 [IU] via SUBCUTANEOUS
  Administered 2023-04-27: 3 [IU] via SUBCUTANEOUS
  Administered 2023-04-27: 1 [IU] via SUBCUTANEOUS
  Administered 2023-04-28: 2 [IU] via SUBCUTANEOUS
  Administered 2023-04-28: 1 [IU] via SUBCUTANEOUS
  Administered 2023-04-28: 2 [IU] via SUBCUTANEOUS
  Administered 2023-04-29: 3 [IU] via SUBCUTANEOUS
  Administered 2023-04-29 – 2023-04-30 (×3): 2 [IU] via SUBCUTANEOUS
  Administered 2023-04-30: 1 [IU] via SUBCUTANEOUS

## 2023-04-24 MED ORDER — ASPIRIN 81 MG PO TBEC
81.0000 mg | DELAYED_RELEASE_TABLET | Freq: Every day | ORAL | Status: DC
  Administered 2023-04-24 – 2023-05-04 (×11): 81 mg via ORAL
  Filled 2023-04-24 (×12): qty 1

## 2023-04-24 MED ORDER — HYDRALAZINE HCL 20 MG/ML IJ SOLN: 10.0000 mg | INTRAMUSCULAR | Status: DC | PRN

## 2023-04-24 MED ORDER — PNEUMOCOCCAL 20-VAL CONJ VACC 0.5 ML IM SUSY: 0.5000 mL | PREFILLED_SYRINGE | INTRAMUSCULAR | Status: DC | PRN

## 2023-04-24 MED ORDER — ROSUVASTATIN CALCIUM 5 MG PO TABS
5.0000 mg | ORAL_TABLET | ORAL | Status: DC
  Administered 2023-04-25 – 2023-05-02 (×3): 5 mg via ORAL
  Filled 2023-04-24 (×4): qty 1

## 2023-04-24 MED ORDER — GUAIFENESIN 100 MG/5ML PO LIQD: 5.0000 mL | ORAL | Status: DC | PRN

## 2023-04-24 MED ORDER — TICAGRELOR 90 MG PO TABS
90.0000 mg | ORAL_TABLET | Freq: Two times a day (BID) | ORAL | Status: DC
  Administered 2023-04-24 – 2023-05-04 (×21): 90 mg via ORAL
  Filled 2023-04-24 (×20): qty 1

## 2023-04-24 MED ORDER — ACETAMINOPHEN 325 MG PO TABS
650.0000 mg | ORAL_TABLET | Freq: Four times a day (QID) | ORAL | Status: DC | PRN
  Administered 2023-04-24 – 2023-05-03 (×4): 650 mg via ORAL
  Filled 2023-04-24 (×4): qty 2

## 2023-04-24 MED ORDER — MIDODRINE HCL 5 MG PO TABS
2.5000 mg | ORAL_TABLET | Freq: Three times a day (TID) | ORAL | Status: DC
  Filled 2023-04-24: qty 1

## 2023-04-24 MED ORDER — HEPARIN SODIUM (PORCINE) 5000 UNIT/ML IJ SOLN
5000.0000 [IU] | Freq: Three times a day (TID) | INTRAMUSCULAR | Status: DC
  Administered 2023-04-24 – 2023-05-04 (×31): 5000 [IU] via SUBCUTANEOUS
  Filled 2023-04-24 (×31): qty 1

## 2023-04-24 NOTE — Progress Notes (Signed)
PHARMACY CONSULT NOTE FOR:  OUTPATIENT  PARENTERAL ANTIBIOTIC THERAPY (OPAT)  Indication: MRSA bacteremia Regimen: Daptomycin 700 mg IV every 24 hours End date: 05/11/23  Noted re-admission with plans now to discharge to SNF. Will repend OPAT orders, case management should check with SNF on acceptance on antibiotics  IV antibiotic discharge orders are pended. To discharging provider:  please sign these orders via discharge navigator,  Select New Orders & click on the button choice - Manage This Unsigned Work.     Thank you for allowing pharmacy to be a part of this patient's care.  Georgina Pillion, PharmD, BCPS, BCIDP Infectious Diseases Clinical Pharmacist 04/24/2023 1:56 PM   **Pharmacist phone directory can now be found on [email or web address] ([email or web address]).  Listed under [facility].

## 2023-04-24 NOTE — ED Notes (Signed)
ED TO INPATIENT HANDOFF REPORT  ED Nurse Name and Phone #: Beatris Ship RN [phone]  S Name/Age/Gender Micheal Moore. 72 y.o. male Room/Bed: [phone]  Code Status   Code Status: Prior  Home/SNF/Other Home Patient oriented to: self, place, time, and situation Is this baseline? Yes   Triage Complete: Triage complete  Chief Complaint Weakness [R53.1]  Triage Note Patient BIB EMS from home. Patients wife states that he was discharged from hospital yesterday with a PICC line in place and was supposed to get ABT treatment at home for the next few weeks. Patients wife states that his blood pressure and blood sugars were high and wanted him to be admitted to the hospital because she can not mange the PICC line  VS EMS  190/90 96% room air 114 HR 16 RR 223 CBG    Allergies No Known Allergies  Level of Care/Admitting Diagnosis ED Disposition     ED Disposition  Admit   Condition  --   Comment  Hospital Area: [facility] [[id number]]  Level of Care: Med-Surg [16]  May admit patient to [facility] or [facility] if equivalent level of care is available:: Yes  Covid Evaluation: Confirmed COVID Negative  Diagnosis: Weakness [241835]  Admitting Physician: Stephania Fragmin [facility] [[id number]]  Attending Physician: Stephania Fragmin [facility] [[id number]]  Certification:: I certify this patient will need inpatient services for at least 2 midnights  Estimated Length of Stay: 2          B Medical/Surgery History Past Medical History:  Diagnosis Date   Cancer (HCC)    right elbow melanoma   Diabetes mellitus without complication (HCC)    Type II   Dizziness    DVT (deep venous thrombosis) (HCC)    right leg   Frequent falls    Near syncope    Syncope    Past Surgical History:  Procedure Laterality Date   AMPUTATION TOE Left 04/28/2020   Procedure: AMPUTATION LEFT GREAT TOE;  Surgeon: Nadara Mustard, MD;  Location: [facility] OR;  Service: Orthopedics;  Laterality: Left;    CORONARY/GRAFT ACUTE MI REVASCULARIZATION N/A 01/15/2023   Procedure: Coronary/Graft Acute MI Revascularization;  Surgeon: Orbie Pyo, MD;  Location: [facility] INVASIVE CV LAB;  Service: Cardiovascular;  Laterality: N/A;   INTRAMEDULLARY (IM) NAIL INTERTROCHANTERIC Right 02/23/2022   Procedure: INTRAMEDULLARY (IM) NAIL INTERTROCHANTRIC, RIGHT;  Surgeon: Samson Frederic, MD;  Location: [facility] ORS;  Service: Orthopedics;  Laterality: Right;   LEFT HEART CATH AND CORONARY ANGIOGRAPHY N/A 01/15/2023   Procedure: LEFT HEART CATH AND CORONARY ANGIOGRAPHY;  Surgeon: Orbie Pyo, MD;  Location: [facility] INVASIVE CV LAB;  Service: Cardiovascular;  Laterality: N/A;   LOWER EXTREMITY ANGIOGRAPHY Bilateral 01/18/2023   Procedure: Lower Extremity Angiography;  Surgeon: Nada Libman, MD;  Location: [facility] INVASIVE CV LAB;  Service: Cardiovascular;  Laterality: Bilateral;   MELANOMA EXCISION WITH SENTINEL LYMPH NODE BIOPSY Right 08/19/2020   Procedure: WIDE LOCAL EXCISION RIGHT ELBOW MELANOMA WITH RIGHT AXILLARY SENTINEL LYMPH NODE BIOPSY;  Surgeon: Fritzi Mandes, MD;  Location: [facility] OR;  Service: General;  Laterality: Right;  2nd incision in axilla   TEE WITHOUT CARDIOVERSION N/A 04/19/2023   Procedure: TRANSESOPHAGEAL ECHOCARDIOGRAM;  Surgeon: Little Ishikawa, MD;  Location: [facility] INVASIVE CV LAB;  Service: Cardiovascular;  Laterality: N/A;   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   Procedure: TOTAL HIP ARTHROPLASTY ANTERIOR APPROACH;  Surgeon: Samson Frederic, MD;  Location: [facility] ORS;  Service: Orthopedics;  Laterality: Left;  TOTAL HIP ARTHROPLASTY Left 08/24/2022   Procedure: OPEN REDUCTION, HEAD AND LINER EXCHANGE;  Surgeon: Samson Frederic, MD;  Location: [facility] ORS;  Service: Orthopedics;  Laterality: Left;     A IV Location/Drains/Wounds Patient Lines/Drains/Airways Status     Active Line/Drains/Airways     Name Placement date Placement time Site Days   PICC Single Lumen 04/19/23 Right Basilic 39 cm 0 cm  04/19/23  1701  Basilic  5   Negative Pressure Wound Therapy Hip Left 08/24/22  1316  --  243   Pressure Injury 01/15/23 Buttocks Right Stage 2 -  Partial thickness loss of dermis presenting as a shallow open injury with a red, pink wound bed without slough. 01/15/23  --  -- 99   Pressure Injury 01/15/23 Heel Right;Left Stage 2 -  Partial thickness loss of dermis presenting as a shallow open injury with a red, pink wound bed without slough. dry scabbed healing pressure injuries to bilat heels 01/15/23  --  -- 99   Pressure Injury 01/15/23 Foot Left;Anterior Deep Tissue Pressure Injury - Purple or maroon localized area of discolored intact skin or blood-filled blister due to damage of underlying soft tissue from pressure and/or shear. 01/15/23  --  -- 99   Pressure Injury 01/15/23 Foot Left;Anterior Deep Tissue Pressure Injury - Purple or maroon localized area of discolored intact skin or blood-filled blister due to damage of underlying soft tissue from pressure and/or shear. inner foot 01/15/23  --  -- 99   Pressure Injury 04/11/23 Sacrum Mid Stage 2 -  Partial thickness loss of dermis presenting as a shallow open injury with a red, pink wound bed without slough. 04/11/23  0300  -- 13   Wound / Incision (Open or Dehisced) 08/28/22 Irritant Dermatitis (Moisture Associated Skin Damage) Buttocks Right;Left 08/28/22  --  Buttocks  239   Wound / Incision (Open or Dehisced) 01/15/23 Other (Comment) Leg Left;Posterior 01/15/23  --  Leg  99   Wound / Incision (Open or Dehisced) 01/15/23 Other (Comment) Leg Right;Posterior 01/15/23  --  Leg  99   Wound / Incision (Open or Dehisced) 01/15/23 Other (Comment) Toe (Comment  which one) Right right great toe 01/15/23  --  Toe (Comment  which one)  99   Wound / Incision (Open or Dehisced) 01/15/23 Other (Comment) Toe (Comment  which one) Left;Right dry scab to left and right second toes 01/15/23  --  Toe (Comment  which one)  99   Wound / Incision (Open or Dehisced)  04/11/23 Irritant Dermatitis (Moisture Associated Skin Damage) Buttocks Bilateral 04/11/23  0300  Buttocks  13   Wound / Incision (Open or Dehisced) 04/11/23 Diabetic ulcer Heel Left 04/11/23  0300  Heel  13   Wound / Incision (Open or Dehisced) 04/17/23 Non-pressure wound Pretibial Distal;Left 04/17/23  1500  Pretibial  7            Intake/Output Last 24 hours No intake or output data in the 24 hours ending 04/24/23 1209  Labs/Imaging Results for orders placed or performed during the hospital encounter of 04/24/23 (from the past 48 hour(s))  CBG monitoring, ED     Status: Abnormal   Collection Time: 04/24/23 10:13 AM  Result Value Ref Range   Glucose-Capillary 193 (H) 70 - 99 mg/dL    Comment: Glucose reference range applies only to samples taken after fasting for at least 8 hours.  Basic metabolic panel     Status: Abnormal   Collection Time: 04/24/23 10:33 AM  Result Value Ref Range   Sodium 138 135 - 145 mmol/L   Potassium 3.8 3.5 - 5.1 mmol/L   Chloride 101 98 - 111 mmol/L   CO2 22 22 - 32 mmol/L   Glucose, Bld 192 (H) 70 - 99 mg/dL    Comment: Glucose reference range applies only to samples taken after fasting for at least 8 hours.   BUN 19 8 - 23 mg/dL   Creatinine, Ser 5.62 0.61 - 1.24 mg/dL   Calcium 9.2 8.9 - 13.0 mg/dL   GFR, Estimated >86 >57 mL/min    Comment: (NOTE) Calculated using the CKD-EPI Creatinine Equation (2021)    Anion gap 15 5 - 15    Comment: Performed at [facility], [address] [place], [place] [address]  CBC with Differential     Status: Abnormal   Collection Time: 04/24/23 10:33 AM  Result Value Ref Range   WBC 9.5 4.0 - 10.5 K/uL   RBC 3.93 (L) 4.22 - 5.81 MIL/uL   Hemoglobin 12.3 (L) 13.0 - 17.0 g/dL   HCT 29.5 28.4 - 13.2 %   MCV 99.5 80.0 - 100.0 fL   MCH 31.3 26.0 - 34.0 pg   MCHC 31.5 30.0 - 36.0 g/dL   RDW 44.0 10.2 - 72.5 %   Platelets 342 150 - 400 K/uL   nRBC 0.0 0.0 - 0.2 %   Neutrophils Relative % 69 %   Neutro  Abs 6.7 1.7 - 7.7 K/uL   Lymphocytes Relative 21 %   Lymphs Abs 2.0 0.7 - 4.0 K/uL   Monocytes Relative 5 %   Monocytes Absolute 0.4 0.1 - 1.0 K/uL   Eosinophils Relative 4 %   Eosinophils Absolute 0.4 0.0 - 0.5 K/uL   Basophils Relative 1 %   Basophils Absolute 0.1 0.0 - 0.1 K/uL   Immature Granulocytes 0 %   Abs Immature Granulocytes 0.04 0.00 - 0.07 K/uL    Comment: Performed at [facility], [address], [place], [place] [address]   No results found.  Pending Labs Unresulted Labs (From admission, onward)    None       Vitals/Pain Today's Vitals   04/24/23 1014 04/24/23 1015  BP: (!) 146/98   Pulse: (!) 113   Resp: 17   Temp: 98.6 F (37 C)   TempSrc: Oral   SpO2: 100%   Weight:  86.2 kg  Height:  6\' 5"  (1.956 m)  PainSc: 0-No pain     Isolation Precautions No active isolations  Medications Medications  DAPTOmycin (CUBICIN) 700 mg in sodium chloride 0.9 % IVPB (has no administration in time range)  sodium chloride 0.9 % bolus 1,000 mL (0 mLs Intravenous Stopped 04/24/23 1208)    Mobility walks      R Recommendations: See Admitting Provider Note  Report given to: 2W02

## 2023-04-24 NOTE — ED Provider Notes (Signed)
[place] EMERGENCY DEPARTMENT AT [facility] Provider Note   CSN: [id number] Arrival date & time: 04/24/23  1000     History  Chief Complaint  Patient presents with   Hypertension   Hyperglycemia    Micheal Moore. is a 72 y.o. male With a complicated medical history that includes type 2 diabetes, right lower extremity DVT, coronary disease status post PCI, recurrent syncope, recent hospitalization and discharge for bacteremia, presenting back to the ED by EMS for concern for weakness and asking for placement.  The patient has just been discharged 2 days ago from the hospital per my review of medical records.  He was discharged with a PICC line in place, for an extended course of antibiotics for MRSA and Providencia bacteremia.  Per my review of the medical records there were several discussions between the patient's physician in the hospital and the patient and his family regarding need for subacute rehab at SNF, given his limited mobility, and complications managing his infusions, as the patient did not want outsiders in his home.  However the patient ultimately insisted on going home.  He now returns telling me that his wife cannot care for him at home.  She does not know how to give the infusions and no one is able to help him up around the bed.  He is now amenable to being placed in a SNF.  HPI     Home Medications Prior to Admission medications   Medication Sig Start Date End Date Taking? Authorizing Provider  acetaminophen (TYLENOL) 325 MG tablet Take 2 tablets (650 mg total) by mouth every 6 (six) hours as needed for mild pain, fever or headache. 04/22/23   Lonia Blood, MD  aspirin EC 81 MG tablet Take 1 tablet (81 mg total) by mouth daily. Swallow whole. 01/20/23   Lanae Boast, MD  Blood Glucose Monitoring Suppl DEVI 1 each by Does not apply route 3 (three) times daily. May dispense any manufacturer covered by patient's insurance. 01/20/23   Lanae Boast, MD   BRILINTA 90 MG TABS tablet Take 90 mg by mouth 2 (two) times daily. 04/09/23   [provider]  ciprofloxacin (CIPRO) 750 MG tablet Take 1 tablet (750 mg total) by mouth 2 (two) times daily for 11 doses. 04/22/23 04/28/23  Lonia Blood, MD  daptomycin (CUBICIN) IVPB Inject 700 mg into the vein daily for 21 days. Indication:  MRSA bacteremia First Dose: Yes Last Day of Therapy:  05/10/23 Labs - Once weekly:  CBC/D, BMP, and CPK Labs - Once weekly: ESR and CRP Method of administration: IV Push Method of administration may be changed at the discretion of home infusion pharmacist based upon assessment of the patient and/or caregiver's ability to self-administer the medication ordered. 04/19/23 05/10/23  Lonia Blood, MD  Ensure Max Protein (ENSURE MAX PROTEIN) LIQD Take 330 mLs (11 oz total) by mouth 2 (two) times daily. Patient taking differently: Take 11 oz by mouth daily. 08/28/22   Lorin Glass, MD  fludrocortisone (FLORINEF) 0.1 MG tablet Take 2 tablets (0.2 mg total) by mouth daily. 03/05/22   Rolly Salter, MD  Glucagon, rDNA, (GLUCAGON EMERGENCY) 1 MG KIT Inject 1 mg into the skin as needed for up to 2 doses (Severe low blood sugar). 01/20/23   Lanae Boast, MD  Glucose Blood (BLOOD GLUCOSE TEST STRIPS) STRP 1 each by Does not apply route 3 (three) times daily. Use as directed to check blood sugar. May dispense any  manufacturer covered by [employer] and fits patient's device. 01/20/23   Lanae Boast, MD  insulin aspart (NOVOLOG) 100 UNIT/ML FlexPen Inject 8 Units into the skin 3 (three) times daily with meals. Only take if eating a meal AND Blood Glucose (BG) is 80 or higher. 04/22/23 07/21/23  Lonia Blood, MD  insulin detemir (LEVEMIR) 100 UNIT/ML FlexPen Inject 20 Units into the skin at bedtime. DO NOT TAKE if your blood sugars and consistently running < 120 04/23/23   Lonia Blood, MD  Insulin Pen Needle (PEN NEEDLES) 31G X 5 MM MISC 1 each by Does not apply route 3  (three) times daily. May dispense any manufacturer covered by patient's insurance. 01/20/23   Lanae Boast, MD  JARDIANCE 10 MG TABS tablet Take 10 mg by mouth daily. 04/09/23   [provider]  Lancet Device MISC 1 each by Does not apply route 3 (three) times daily. May dispense any manufacturer covered by patient's insurance. 01/20/23   Lanae Boast, MD  Lancets MISC 1 each by Does not apply route 3 (three) times daily. Use as directed to check blood sugar. May dispense any manufacturer covered by patient's insurance and fits patient's device. 01/20/23   Lanae Boast, MD  midodrine (PROAMATINE) 5 MG tablet Take 0.5 tablets (2.5 mg total) by mouth 3 (three) times daily with meals. Patient taking differently: Take 7.5 mg by mouth 3 (three) times daily with meals. 07/25/22   Rodolph Bong, MD  polyethylene glycol (MIRALAX / GLYCOLAX) 17 g packet Take 17 g by mouth daily. Patient taking differently: Take 17 g by mouth daily as needed for mild constipation. 03/05/22   Rolly Salter, MD  rosuvastatin (CRESTOR) 5 MG tablet Take 5 mg by mouth 2 (two) times a week. 02/07/23   [provider]      Allergies    Patient has no known allergies.    Review of Systems   Review of Systems  Physical Exam Updated Vital Signs BP (!) 146/98 (BP Location: Left Arm)   Pulse (!) 113   Temp 98.6 F (37 C) (Oral)   Resp 17   Ht 6\' 5"  (1.956 m)   Wt 86.2 kg   SpO2 100%   BMI 22.53 kg/m  Physical Exam Constitutional:      General: He is not in acute distress. HENT:     Head: Normocephalic and atraumatic.  Eyes:     Conjunctiva/sclera: Conjunctivae normal.     Pupils: Pupils are equal, round, and reactive to light.  Cardiovascular:     Rate and Rhythm: Normal rate and regular rhythm.  Pulmonary:     Effort: Pulmonary effort is normal. No respiratory distress.  Abdominal:     General: There is no distension.     Tenderness: There is no abdominal tenderness.  Musculoskeletal:     Comments:  PICC line in right arm  Skin:    General: Skin is warm and dry.  Neurological:     General: No focal deficit present.     Mental Status: He is alert. Mental status is at baseline.     ED Results / Procedures / Treatments   Labs (all labs ordered are listed, but only abnormal results are displayed) Labs Reviewed  BASIC METABOLIC PANEL - Abnormal; Notable for the following components:      Result Value   Glucose, Bld 192 (*)    All other components within normal limits  CBC WITH DIFFERENTIAL/PLATELET - Abnormal; Notable for the  following components:   RBC 3.93 (*)    Hemoglobin 12.3 (*)    All other components within normal limits  CBG MONITORING, ED - Abnormal; Notable for the following components:   Glucose-Capillary 193 (*)    All other components within normal limits    EKG None  Radiology No results found.  Procedures Procedures    Medications Ordered in ED Medications  DAPTOmycin (CUBICIN) 700 mg in sodium chloride 0.9 % IVPB (has no administration in time range)  sodium chloride 0.9 % bolus 1,000 mL (1,000 mLs Intravenous New Bag/Given 04/24/23 1032)    ED Course/ Medical Decision Making/ A&P                                 Medical Decision Making Amount and/or Complexity of Data Reviewed Labs: ordered.  Risk Decision regarding hospitalization.   This patient presents to the ED with concern for inability to care for himself at home. This involves an extensive number of treatment options, and is a complaint that carries with it a high risk of complications and morbidity.    Co-morbidities that complicate the patient evaluation: diabetes, HTN, bacteremia - high risk of worsening health status with medical noncompliance  Additional history obtained from EMS  External records from outside source obtained and reviewed including hospital course and discharge summary the past month  I ordered and personally interpreted labs.  The pertinent results include: No  emergent findings   The patient was maintained on a cardiac monitor.  I personally viewed and interpreted the cardiac monitored which showed an underlying rhythm of: sinus tachycardia  I ordered medication including the daptomycin infusion for daily regimen  I have reviewed the patients home medicines and have made adjustments as needed  Test Considered: Low suspicion for acute PE, CVA, or other emergency development in the past 2 days the patient returned home.   After the interventions noted above, I reevaluated the patient and found that they have: stayed the same  Dispostion:  After consideration of the diagnostic results and the patients response to treatment, I feel that the patent would benefit from admission back to the hospital for placement in SNF which he is now amenable to.         Final Clinical Impression(s) / ED Diagnoses Final diagnoses:  Ambulatory dysfunction    Rx / DC Orders ED Discharge Orders     None         Terald Sleeper, MD 04/24/23 1121

## 2023-04-24 NOTE — H&P (Signed)
History and Physical    Micheal Moore. ZOX:[id number] DOB: [birth year] DOA: 04/24/2023  PCP: [name], MD Patient coming from: Home  Chief Complaint: Weakness  HPI: Micheal Moore. is a 72 y.o. male withPast medical history of chronic hypotension, recent bacteremia, chronic hypotension, CAD, DM2, normocytic anemia, PVD comes to the hospital with weakness.  Patient was recently admitted to the hospital from 7/24 - 8/5 due to bacteremia secondary to MRSA and Providencia.  Hospital course was also complicated by aspiration pneumonia.  Patient underwent echocardiogram and TEE which were negative for vegetation.  Eventually PICC line was placed, PT recommended SNF but patient declined and ended up going home. After going home patient continued to feel weak over the last 2-3 days therefore return back to the hospital for further help in management.  Patient also tells me he has not received his daptomycin and over last 48 hours.  In the ER overall routine blood work is unremarkable.  Patient being admitted for weakness and IV antibiotics.   Review of Systems: As per HPI otherwise 10 point review of systems negative.  Review of Systems Otherwise negative except as per HPI, including: General: Denies fever, chills, night sweats or unintended weight loss. Resp: Denies cough, wheezing, shortness of breath. Cardiac: Denies chest pain, palpitations, orthopnea, paroxysmal nocturnal dyspnea. GI: Denies abdominal pain, nausea, vomiting, diarrhea or constipation GU: Denies dysuria, frequency, hesitancy or incontinence MS: Denies muscle aches, joint pain or swelling Neuro: Denies headache, neurologic deficits (focal weakness, numbness, tingling), abnormal gait Psych: Denies anxiety, depression, SI/HI/AVH Skin: Denies new rashes or lesions ID: Denies sick contacts, exotic exposures, travel  Past Medical History:  Diagnosis Date   Cancer (HCC)    right elbow melanoma   Diabetes  mellitus without complication (HCC)    Type II   Dizziness    DVT (deep venous thrombosis) (HCC)    right leg   Frequent falls    Near syncope    Syncope     Past Surgical History:  Procedure Laterality Date   AMPUTATION TOE Left 04/28/2020   Procedure: AMPUTATION LEFT GREAT TOE;  Surgeon: Nadara Mustard, MD;  Location: [facility] OR;  Service: Orthopedics;  Laterality: Left;   CORONARY/GRAFT ACUTE MI REVASCULARIZATION N/A 01/15/2023   Procedure: Coronary/Graft Acute MI Revascularization;  Surgeon: Orbie Pyo, MD;  Location: [facility] INVASIVE CV LAB;  Service: Cardiovascular;  Laterality: N/A;   INTRAMEDULLARY (IM) NAIL INTERTROCHANTERIC Right 02/23/2022   Procedure: INTRAMEDULLARY (IM) NAIL INTERTROCHANTRIC, RIGHT;  Surgeon: Samson Frederic, MD;  Location: [facility] ORS;  Service: Orthopedics;  Laterality: Right;   LEFT HEART CATH AND CORONARY ANGIOGRAPHY N/A 01/15/2023   Procedure: LEFT HEART CATH AND CORONARY ANGIOGRAPHY;  Surgeon: Orbie Pyo, MD;  Location: [facility] INVASIVE CV LAB;  Service: Cardiovascular;  Laterality: N/A;   LOWER EXTREMITY ANGIOGRAPHY Bilateral 01/18/2023   Procedure: Lower Extremity Angiography;  Surgeon: Nada Libman, MD;  Location: [facility] INVASIVE CV LAB;  Service: Cardiovascular;  Laterality: Bilateral;   MELANOMA EXCISION WITH SENTINEL LYMPH NODE BIOPSY Right 08/19/2020   Procedure: WIDE LOCAL EXCISION RIGHT ELBOW MELANOMA WITH RIGHT AXILLARY SENTINEL LYMPH NODE BIOPSY;  Surgeon: Fritzi Mandes, MD;  Location: [facility] OR;  Service: General;  Laterality: Right;  2nd incision in axilla   TEE WITHOUT CARDIOVERSION N/A 04/19/2023   Procedure: TRANSESOPHAGEAL ECHOCARDIOGRAM;  Surgeon: Little Ishikawa, MD;  Location: [facility] INVASIVE CV LAB;  Service: Cardiovascular;  Laterality: N/A;   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   Procedure:  TOTAL HIP ARTHROPLASTY ANTERIOR APPROACH;  Surgeon: Samson Frederic, MD;  Location: [facility] ORS;  Service: Orthopedics;  Laterality: Left;   TOTAL HIP  ARTHROPLASTY Left 08/24/2022   Procedure: OPEN REDUCTION, HEAD AND LINER EXCHANGE;  Surgeon: Samson Frederic, MD;  Location: [facility] ORS;  Service: Orthopedics;  Laterality: Left;    SOCIAL HISTORY:  reports that he has been smoking cigarettes. He has a 3.8 pack-year smoking history. He has never used smokeless tobacco. He reports that he does not currently use drugs. He reports that he does not drink alcohol.  No Known Allergies  FAMILY HISTORY: Family History  Problem Relation Age of Onset   Stroke Mother    Diabetes Father    Stroke Sister    Diabetes Sister      Prior to Admission medications   Medication Sig Start Date End Date Taking? Authorizing Provider  acetaminophen (TYLENOL) 325 MG tablet Take 2 tablets (650 mg total) by mouth every 6 (six) hours as needed for mild pain, fever or headache. 04/22/23   Lonia Blood, MD  aspirin EC 81 MG tablet Take 1 tablet (81 mg total) by mouth daily. Swallow whole. 01/20/23   Lanae Boast, MD  Blood Glucose Monitoring Suppl DEVI 1 each by Does not apply route 3 (three) times daily. May dispense any manufacturer covered by patient's insurance. 01/20/23   Lanae Boast, MD  BRILINTA 90 MG TABS tablet Take 90 mg by mouth 2 (two) times daily. 04/09/23   [provider]  ciprofloxacin (CIPRO) 750 MG tablet Take 1 tablet (750 mg total) by mouth 2 (two) times daily for 11 doses. 04/22/23 04/28/23  Lonia Blood, MD  daptomycin (CUBICIN) IVPB Inject 700 mg into the vein daily for 21 days. Indication:  MRSA bacteremia First Dose: Yes Last Day of Therapy:  05/10/23 Labs - Once weekly:  CBC/D, BMP, and CPK Labs - Once weekly: ESR and CRP Method of administration: IV Push Method of administration may be changed at the discretion of home infusion pharmacist based upon assessment of the patient and/or caregiver's ability to self-administer the medication ordered. 04/19/23 05/10/23  Lonia Blood, MD  Ensure Max Protein (ENSURE MAX PROTEIN) LIQD Take  330 mLs (11 oz total) by mouth 2 (two) times daily. Patient taking differently: Take 11 oz by mouth daily. 08/28/22   Lorin Glass, MD  fludrocortisone (FLORINEF) 0.1 MG tablet Take 2 tablets (0.2 mg total) by mouth daily. 03/05/22   Rolly Salter, MD  Glucagon, rDNA, (GLUCAGON EMERGENCY) 1 MG KIT Inject 1 mg into the skin as needed for up to 2 doses (Severe low blood sugar). 01/20/23   Lanae Boast, MD  Glucose Blood (BLOOD GLUCOSE TEST STRIPS) STRP 1 each by Does not apply route 3 (three) times daily. Use as directed to check blood sugar. May dispense any manufacturer covered by patient's insurance and fits patient's device. 01/20/23   Lanae Boast, MD  insulin aspart (NOVOLOG) 100 UNIT/ML FlexPen Inject 8 Units into the skin 3 (three) times daily with meals. Only take if eating a meal AND Blood Glucose (BG) is 80 or higher. 04/22/23 07/21/23  Lonia Blood, MD  insulin detemir (LEVEMIR) 100 UNIT/ML FlexPen Inject 20 Units into the skin at bedtime. DO NOT TAKE if your blood sugars and consistently running < 120 04/23/23   Lonia Blood, MD  Insulin Pen Needle (PEN NEEDLES) 31G X 5 MM MISC 1 each by Does not apply route 3 (three) times daily. May dispense any manufacturer  covered by patient's insurance. 01/20/23   Lanae Boast, MD  JARDIANCE 10 MG TABS tablet Take 10 mg by mouth daily. 04/09/23   [provider]  Lancet Device MISC 1 each by Does not apply route 3 (three) times daily. May dispense any manufacturer covered by patient's insurance. 01/20/23   Lanae Boast, MD  Lancets MISC 1 each by Does not apply route 3 (three) times daily. Use as directed to check blood sugar. May dispense any manufacturer covered by patient's insurance and fits patient's device. 01/20/23   Lanae Boast, MD  midodrine (PROAMATINE) 5 MG tablet Take 0.5 tablets (2.5 mg total) by mouth 3 (three) times daily with meals. Patient taking differently: Take 7.5 mg by mouth 3 (three) times daily with meals. 07/25/22   Rodolph Bong, MD  polyethylene glycol (MIRALAX / GLYCOLAX) 17 g packet Take 17 g by mouth daily. Patient taking differently: Take 17 g by mouth daily as needed for mild constipation. 03/05/22   Rolly Salter, MD  rosuvastatin (CRESTOR) 5 MG tablet Take 5 mg by mouth 2 (two) times a week. 02/07/23   [provider]    Physical Exam: Vitals:   04/24/23 1014 04/24/23 1015 04/24/23 1130 04/24/23 1345  BP: (!) 146/98  (!) 155/84 (!) 168/100  Pulse: (!) 113  (!) 102 (!) 106  Resp: 17  18 20   Temp: 98.6 F (37 C)   98.9 F (37.2 C)  TempSrc: Oral   Oral  SpO2: 100%  100% 100%  Weight:  86.2 kg    Height:  6\' 5"  (1.956 m)        Constitutional: NAD, calm, comfortable Eyes: PERRL, lids and conjunctivae normal ENMT: Mucous membranes are moist. Posterior pharynx clear of any exudate or lesions.Normal dentition.  Neck: normal, supple, no masses, no thyromegaly Respiratory: clear to auscultation bilaterally, no wheezing, no crackles. Normal respiratory effort. No accessory muscle use.  Cardiovascular: Regular rate and rhythm, no murmurs / rubs / gallops. No extremity edema. 2+ pedal pulses. No carotid bruits.  Abdomen: no tenderness, no masses palpated. No hepatosplenomegaly. Bowel sounds positive.  Musculoskeletal: no clubbing / cyanosis. No joint deformity upper and lower extremities. Good ROM, no contractures. Normal muscle tone.  Skin: no rashes, lesions, ulcers. No induration Neurologic: CN 2-12 grossly intact. Sensation intact, DTR normal. Strength 5/5 in all 4.  Psychiatric: Normal judgment and insight. Alert and oriented x 3. Normal mood.     Labs on Admission: I have personally reviewed following labs and imaging studies  CBC: Recent Labs  Lab 04/18/23 0302 04/24/23 1033  WBC 8.4 9.5  NEUTROABS  --  6.7  HGB 11.8* 12.3*  HCT 37.1* 39.1  MCV 95.9 99.5  PLT 317 342   Basic Metabolic Panel: Recent Labs  Lab 04/18/23 0302 04/20/23 0453 04/24/23 1033  NA 135 137 138  K  4.0 3.6 3.8  CL 99 100 101  CO2 28 28 22   GLUCOSE 154* 155* 192*  BUN 16 14 19   CREATININE 0.60* 0.61 0.70  CALCIUM 8.8* 8.9 9.2   GFR: Estimated Creatinine Clearance: 101.8 mL/min (by C-G formula based on SCr of 0.7 mg/dL). Liver Function Tests: Recent Labs  Lab 04/18/23 0302  AST 16  ALT 17  ALKPHOS 59  BILITOT 0.5  PROT 6.2*  ALBUMIN 2.8*   No results for input(s): "LIPASE", "AMYLASE" in the last 168 hours. No results for input(s): "AMMONIA" in the last 168 hours. Coagulation Profile: No results for input(s): "INR", "PROTIME"  in the last 168 hours. Cardiac Enzymes: Recent Labs  Lab 04/19/23 1600  CKTOTAL 36*   BNP (last 3 results) No results for input(s): "PROBNP" in the last 8760 hours. HbA1C: No results for input(s): "HGBA1C" in the last 72 hours. CBG: Recent Labs  Lab 04/22/23 0002 04/22/23 0424 04/22/23 0721 04/22/23 1107 04/24/23 1013  GLUCAP 183* 188* 175* 194* 193*   Lipid Profile: No results for input(s): "CHOL", "HDL", "LDLCALC", "TRIG", "CHOLHDL", "LDLDIRECT" in the last 72 hours. Thyroid Function Tests: No results for input(s): "TSH", "T4TOTAL", "FREET4", "T3FREE", "THYROIDAB" in the last 72 hours. Anemia Panel: No results for input(s): "VITAMINB12", "FOLATE", "FERRITIN", "TIBC", "IRON", "RETICCTPCT" in the last 72 hours. Urine analysis:    Component Value Date/Time   COLORURINE YELLOW 01/15/2023 1813   APPEARANCEUR CLEAR 01/15/2023 1813   APPEARANCEUR Clear 12/04/2021 1430   LABSPEC 1.033 (H) 01/15/2023 1813   PHURINE 5.0 01/15/2023 1813   GLUCOSEU >=500 (A) 01/15/2023 1813   HGBUR NEGATIVE 01/15/2023 1813   BILIRUBINUR NEGATIVE 01/15/2023 1813   BILIRUBINUR Negative 12/04/2021 1430   KETONESUR 5 (A) 01/15/2023 1813   PROTEINUR NEGATIVE 01/15/2023 1813   NITRITE NEGATIVE 01/15/2023 1813   LEUKOCYTESUR NEGATIVE 01/15/2023 1813   Sepsis Labs: !!!!!!!!!!!!!!!!!!!!!!!!!!!!!!!!!!!!!!!!!!!! @LABRCNTIP (procalcitonin:4,lacticidven:4) )No  results found for this or any previous visit (from the past 240 hour(s)).   Radiological Exams on Admission: No results found.   All images have been reviewed by me personally.    Assessment/Plan Principal Problem:   Weakness Active Problems:   Hip fracture (HCC)   Aspiration pneumonia (HCC)   CAD (coronary artery disease)   Type 2 diabetes mellitus with hyperlipidemia (HCC)   Generalized weakness with ambulatory dysfunction - Secondary to ongoing bacteremia with MRSA and Providencia.  Recently patient declined SNF admission but did not do well at home therefore comes back to the hospital we will consult PT/OT, TOC. - Check vitamin D and TSH  Polymicrobial bacteremia, MRSA/Providencia -Will continue home daptomycin, last day 8/23.  Also 1 more day of oral Cipro as previously planned.  Recently admitted due to these reasons, TTE and TEE were negative for any vegetations.  PICC line was placed on 8/2 without any issues.  Also there was concerns of some aspiration pneumonia at that time but it had resolved and patient is already on antibiotics. Apparently patient did not receive his daptomycin over the last 48 hours at home either.  I will resume this here.  Last day initially scheduled to be 8/23.  Chronic hypotension - Continue midodrine and Florinef  History of CAD status post MI and stent placement April 2024 - Only on home aspirin and Brilinta.  Not on any other medication due to chronic hypotension.  Currently on medications as mentioned above.  Insulin-dependent diabetes mellitus type 2 - Will place him on long-acting, sliding scale and Accu-Cheks.  Adjust as necessary  History of peripheral vascular disease - Already on aspirin and Brilinta  Normocytic anemia - Hemoglobin stable    DVT prophylaxis: Subcu heparin Code Status: Full code Family Communication:  Consults called: None Admission status: Inpatient admission for IV antibiotics  Status is:  Inpatient Remains inpatient appropriate because: IV antibiotics thereafter placement   Time Spent: 65 minutes.  >50% of the time was devoted to discussing the patients care, assessment, plan and disposition with other care givers along with counseling the patient about the risks and benefits of treatment.     Joline Maxcy MD [facility]  If 7PM-7AM, please contact night-coverage  04/24/2023, 1:49 PM

## 2023-04-24 NOTE — ED Triage Notes (Signed)
Patient BIB EMS from home. Patients wife states that he was discharged from hospital yesterday with a PICC line in place and was supposed to get ABT treatment at home for the next few weeks. Patients wife states that his blood pressure and blood sugars were high and wanted him to be admitted to the hospital because she can not mange the PICC line  VS EMS  190/90 96% room air 114 HR 16 RR 223 CBG

## 2023-04-24 NOTE — Evaluation (Signed)
Occupational Therapy Evaluation Patient Details Name: Micheal Moore. MRN: [id number] DOB: 05-30-1951 Today's Date: 04/24/2023   History of Present Illness Pt is a 72 y.o. male re-admitted with increased weakness after discharge from hospital 2 days ago. Pt ws admitted from 7/24 - 8/5 due to bacteremia secondary to MRSA and Providencia, complicated by PNA. PMH includes CAD s/p PCI, syncope, orthostatic hypotension, diabetes mellitus type 2, DVT of right leg, peripheral artery disease, right elbow melanoma s/p excision, L THA x2 (due to complications)   Clinical Impression   Micheal Moore was evaluated s/p the above admission list. Since April 2024 he is bed bound at baseline, his wife dependently completes all LB ADLS, he assists some with UB ADLs, he is lifted out of bed to the [facility] by 2 men for appointments and has an extreme fear of falling. Upon evaluation the pt was limited by LLE pain, impaired cognition with very poor insight and problem solving, generalized weakness, multiple pressure injuries and decreased activity tolerance. Overall he is total A for bed level mobility and he refused to mobilize due to LLE pain. Due to the deficits listed below the pt also needs total A for LB ADLs and min A for UB ADLs. Pt's self directed goals is to walk again, encouragement and eduction provided on the importance to participate with therapies to achieve that goal. Pt states he will "try again tomorrow."  Pt will benefit from continued acute OT services on a trial basis based on pt participation and skilled inpatient follow up therapy, <3 hours/day.        If plan is discharge home, recommend the following: Two people to help with walking and/or transfers;A lot of help with bathing/dressing/bathroom;Assistance with cooking/housework;Direct supervision/assist for medications management;Direct supervision/assist for financial management;Assist for transportation;Help with stairs or ramp for entrance     Functional Status Assessment  Patient has had a recent decline in their functional status and/or demonstrates limited ability to make significant improvements in function in a reasonable and predictable amount of time  Equipment Recommendations  Other (comment) (pt declining hoyer lift despite education)       Precautions / Restrictions Precautions Precautions: Fall Precaution Comments: multiple pressure injuries Restrictions Weight Bearing Restrictions: No RLE Weight Bearing: Weight bearing as tolerated LLE Weight Bearing: Non weight bearing      Mobility Bed Mobility       General bed mobility comments: pt refused    Transfers       General transfer comment: pt refused          ADL either performed or assessed with clinical judgement   ADL Overall ADL's : Needs assistance/impaired Eating/Feeding: Independent;Bed level   Grooming: Set up;Bed level   Upper Body Bathing: Minimal assistance;Sitting   Lower Body Bathing: Total assistance;Bed level   Upper Body Dressing : Minimal assistance;Bed level   Lower Body Dressing: Total assistance;Bed level   Toilet Transfer: Total assistance   Toileting- Clothing Manipulation and Hygiene: Total assistance       Functional mobility during ADLs: Total assistance General ADL Comments: pt refused all movement despite maximal encouragement. He reports he does not participate in any LB ADLs at baseline. This is likely his functional baseline.     Vision Baseline Vision/History: 1 Wears glasses Vision Assessment?: No apparent visual deficits     Perception Perception: Not tested       Praxis Praxis: Not tested       Pertinent Vitals/Pain Pain Assessment Pain Assessment: Faces Pain Score:  10-Worst pain ever Faces Pain Scale: Hurts a little bit Pain Location: LLE Pain Descriptors / Indicators: Guarding Pain Intervention(s): Monitored during session, Limited activity within patient's tolerance      Extremity/Trunk Assessment Upper Extremity Assessment Upper Extremity Assessment: Generalized weakness   Lower Extremity Assessment Lower Extremity Assessment: Defer to PT evaluation   Cervical / Trunk Assessment Cervical / Trunk Assessment: Other exceptions Cervical / Trunk Exceptions: pressure injuries on sacrum   Communication Communication Communication: No apparent difficulties   Cognition Arousal: Alert Behavior During Therapy: Flat affect Overall Cognitive Status: Impaired/Different from baseline Area of Impairment: Attention, Memory, Following commands, Safety/judgement, Awareness, Problem solving                   Current Attention Level: Sustained Memory: Decreased recall of precautions, Decreased short-term memory Following Commands: Follows one step commands consistently Safety/Judgement: Decreased awareness of deficits, Decreased awareness of safety Awareness: Intellectual Problem Solving: Difficulty sequencing, Decreased initiation, Requires verbal cues General Comments: Very poor insight and awareness. Pt states that he wants to stand and walk one day but refuses all movement due to LLE pain despite maximal encouragement and education.     General Comments  VSS.     Home Living Family/patient expects to be discharged to:: Skilled nursing facility Living Arrangements: Spouse/significant other Available Help at Discharge: Family;Available 24 hours/day Type of Home: House Home Access: Stairs to enter [employer] of Steps: 2 to the poarch and one from porch into home Entrance Stairs-Rails: None Home Layout: One level     Bathroom Shower/Tub: Chief Strategy Officer: Standard     Home Equipment: Agricultural consultant (2 wheels);Wheelchair - manual;Hospital bed          Prior Functioning/Environment Prior Level of Function : Needs assist             Mobility Comments: Bedbound since April 2024. Pt reports 2 guys lift  him OOB to [facility] when he has appointments ADLs Comments: ADLs at bed level, pt reports he minimally assists with UB ADLs and his wife dependently completes LB ADLs. He has BMs in the bed, wife completes peri care.        OT Problem List: Decreased strength;Decreased range of motion;Decreased activity tolerance;Impaired balance (sitting and/or standing);Decreased safety awareness;Decreased knowledge of use of DME or AE;Decreased knowledge of precautions      OT Treatment/Interventions: Self-care/ADL training;Therapeutic exercise;DME and/or AE instruction;Therapeutic activities;Balance training;Patient/family education    OT Goals(Current goals can be found in the care plan section) Acute Rehab OT Goals Patient Stated Goal: to go home OT Goal Formulation: With patient Time For Goal Achievement: 05/08/23 Potential to Achieve Goals: Good ADL Goals Pt Will Perform Upper Body Dressing: with set-up Additional ADL Goal #1: Pt will complete bed mobility with mod A as a precursor to ADLs Additional ADL Goal #2: Pt will tolerate dependent hoyer lift from bed to chair to promote upright body position  OT Frequency: Min 1X/week       AM-PAC OT "6 Clicks" Daily Activity     Outcome Measure Help from another person eating meals?: None Help from another person taking care of personal grooming?: A Little Help from another person toileting, which includes using toliet, bedpan, or urinal?: Total Help from another person bathing (including washing, rinsing, drying)?: A Lot Help from another person to put on and taking off regular upper body clothing?: A Little Help from another person to put on and taking off regular lower body clothing?: Total 6  Click Score: 14   End of Session Nurse Communication: Mobility status  Activity Tolerance: Patient limited by pain Patient left: in bed;with call bell/phone within reach;with bed alarm set  OT Visit Diagnosis: Other abnormalities of gait and mobility  (R26.89);Muscle weakness (generalized) (M62.81);History of falling (Z91.81);Repeated falls (R29.6)                Time: 1610-9604 OT Time Calculation (min): 14 min Charges:  OT General Charges $OT Visit: 1 Visit OT Evaluation $OT Eval Moderate Complexity: 1 Mod  Derenda Mis, OTR/L Acute Rehabilitation Services Office [phone] Secure Chat Communication Preferred   Donia Pounds 04/24/2023, 4:45 PM

## 2023-04-25 DIAGNOSIS — R531 Weakness: Secondary | ICD-10-CM | POA: Diagnosis not present

## 2023-04-25 LAB — GLUCOSE, CAPILLARY
Glucose-Capillary: 158 mg/dL — ABNORMAL HIGH (ref 70–99)
Glucose-Capillary: 146 mg/dL — ABNORMAL HIGH (ref 70–99)
Glucose-Capillary: 146 mg/dL — ABNORMAL HIGH (ref 70–99)
Glucose-Capillary: 144 mg/dL — ABNORMAL HIGH (ref 70–99)

## 2023-04-25 MED ORDER — BISACODYL 10 MG RE SUPP
10.0000 mg | Freq: Once | RECTAL | Status: DC
  Filled 2023-04-25: qty 1

## 2023-04-25 MED ORDER — SENNA 8.6 MG PO TABS
1.0000 | ORAL_TABLET | Freq: Two times a day (BID) | ORAL | Status: DC
  Administered 2023-04-25 – 2023-05-03 (×15): 8.6 mg via ORAL
  Filled 2023-04-25 (×18): qty 1

## 2023-04-25 MED ORDER — VITAMIN D (ERGOCALCIFEROL) 1.25 MG (50000 UNIT) PO CAPS
50000.0000 [IU] | ORAL_CAPSULE | ORAL | Status: DC
  Administered 2023-04-25 – 2023-05-02 (×2): 50000 [IU] via ORAL
  Filled 2023-04-25 (×2): qty 1

## 2023-04-25 MED ORDER — POLYETHYLENE GLYCOL 3350 17 G PO PACK
17.0000 g | PACK | Freq: Every day | ORAL | Status: DC
  Administered 2023-04-25 – 2023-04-27 (×3): 17 g via ORAL
  Filled 2023-04-25 (×3): qty 1

## 2023-04-25 MED ORDER — POTASSIUM CHLORIDE CRYS ER 20 MEQ PO TBCR
40.0000 meq | EXTENDED_RELEASE_TABLET | Freq: Once | ORAL | Status: AC
  Administered 2023-04-25: 40 meq via ORAL
  Filled 2023-04-25: qty 2

## 2023-04-25 NOTE — Evaluation (Signed)
Physical Therapy Evaluation Patient Details Name: Micheal Moore. MRN: [id number] DOB: 04-16-1951 Today's Date: 04/25/2023  History of Present Illness  72 y.o. male re-admitted 8/7 with increased weakness. PMhx: Admission 7/24 - 8/5 with bacteremia secondary to MRSA and Providencia, complicated by PNA. CAD s/p PCI, syncope, orthostatic hypotension, T2DM, DVT, PAD, right elbow melanoma s/p excision, L THA x2 (due to complications)  Clinical Impression  Pt pleasant and reports he has been bed bound for months but unsure how long. Per therapy notes has been max +2 since at least Dec 2023 and pt has LLE hip and knee flexion contracture preventing him from reaching hip flexion of 90 degrees for functional sitting. Pt unable to sit EOB without significant assist due to hip flexion deficits and he denies need for hoyer lift for home because it takes up too much space and he prefers to have the 2 guys at home lift him. Pt with lack of insight into deficits and unrealistic expectations of walking again given limited tolerance for mobility and very limited strength ROM of bil LE. Pt reports desire for SNF and would benefit from caregiver perspective with further therapy needs to be assessed there. Pt educated for LB HEp and encouraged to perform as well as increased HOB into sitting to increase tolerance for hip flexion. Pt at baseline functional status of being bed bound and not currently appropriate for acute therapy.         If plan is discharge home, recommend the following: Two people to help with walking and/or transfers;A lot of help with bathing/dressing/bathroom;Assistance with cooking/housework;Assist for transportation;Help with stairs or ramp for entrance   Can travel by private vehicle   No    Equipment Recommendations Hoyer lift, ramp  Recommendations for Other Services       Functional Status Assessment Patient has not had a recent decline in their functional status      Precautions / Restrictions Precautions Precautions: Fall Precaution Comments: multiple pressure injuries, back, heels Restrictions Weight Bearing Restrictions: No      Mobility  Bed Mobility Overal bed mobility: Needs Assistance Bed Mobility: Rolling, Supine to Sit, Sit to Supine Rolling: Modified independent (Device/Increase time)   Supine to sit: Mod assist Sit to supine: Max assist   General bed mobility comments: pt able to roll right with rail and bed flat. Supine to sit with HOB 40 degrees and max assist to clear legs off bed. pt with maintained posterior lean due to hip flexion contracture and min assist for maitained upright with Rt knee flexion in sitting and Lt knee extended. grossly 3 min EOb. with max assist to lift legs to surface. min to slide toward [facility] in trendelenburg with verbal cues    Transfers                   General transfer comment: unable    Ambulation/Gait                  Stairs            Wheelchair Mobility     Tilt Bed    Modified Rankin (Stroke Patients Only)       Balance Overall balance assessment: Needs assistance   Sitting balance-[name]: Poor Sitting balance - Comments: min physical assist to achieve sitting  Pertinent Vitals/Pain Pain Assessment Pain Score: 3  Pain Location: bil knees with movement Pain Descriptors / Indicators: Aching Pain Intervention(s): Limited activity within patient's tolerance, Repositioned    Home Living Family/patient expects to be discharged to:: Skilled nursing facility Living Arrangements: Spouse/significant other Available Help at Discharge: Family;Available 24 hours/day Type of Home: House Home Access: Stairs to enter   [employer] of Steps: 2 to the poarch and one from porch into home   Home Layout: One level Home Equipment: Agricultural consultant (2 wheels);Wheelchair - manual;Hospital bed      Prior  Function Prior Level of Function : Needs assist             Mobility Comments: Bedbound for months pt providing conflicting time frame Dec 2023 vs April 2024. Pt reports 2 guys lift him OOB to [facility] when he has appointments ADLs Comments: ADLs at bed level, pt reports he minimally assists with UB ADLs and his wife dependently completes LB ADLs. He has BMs in the bed, wife completes peri care. Self feeds     Extremity/Trunk Assessment   Upper Extremity Assessment Upper Extremity Assessment: Defer to OT evaluation    Lower Extremity Assessment Lower Extremity Assessment: RLE deficits/detail;LLE deficits/detail RLE Deficits / Details: knee and hip flexion limited to 90 degrees grossly with 2/5 strength, able to achieve neutral ankle but cannot dorsiflex, 2+/5 hip Add/Abduct with total ROM grossly 20 degrees LLE Deficits / Details: AAROM hip and knee ~15 degrees flexion; hip flexion 2+ can perform partial SLR, knee flexion requiring physical assist. ankle unable to achieve neutral. 2/5 hip abduct/add    Cervical / Trunk Assessment Cervical / Trunk Assessment: Other exceptions Cervical / Trunk Exceptions: pressure injuries on sacrum and back. pt with limited hip flexion and unable to achieve full sitting  Communication   Communication Communication: No apparent difficulties  Cognition Arousal: Alert Behavior During Therapy: Flat affect Overall Cognitive Status: No family/caregiver present to determine baseline cognitive functioning Area of Impairment: Attention, Memory, Following commands, Safety/judgement, Awareness, Problem solving                   Current Attention Level: Sustained   Following Commands: Follows one step commands consistently Safety/Judgement: Decreased awareness of deficits, Decreased awareness of safety   Problem Solving: Requires verbal cues General Comments: poor awareness and insight stating he wants to walk again but can't tolerate sitting and has LLE  contracture but pt doesn't move his legs        General Comments      Exercises General Exercises - Lower Extremity Hip ABduction/ADduction: AROM, Both, 10 reps, Supine Straight Leg Raises: AROM, Both, 10 reps, Supine Hip Flexion/Marching: AAROM, Both, 10 reps, Supine   Assessment/Plan    PT Assessment All further PT needs can be met in the next venue of care  PT Problem List Decreased strength;Decreased range of motion;Decreased activity tolerance;Decreased mobility;Decreased balance;Decreased knowledge of use of DME;Pain       PT Treatment Interventions Therapeutic activities;Therapeutic exercise;Patient/family education    PT Goals (Current goals can be found in the Care Plan section)  Acute Rehab PT Goals PT Goal Formulation: All assessment and education complete, DC therapy    Frequency Min 1X/week     Co-evaluation               AM-PAC PT "6 Clicks" Mobility  Outcome Measure Help needed turning from your back to your side while in a flat bed without using bedrails?: A Little Help needed  moving from lying on your back to sitting on the side of a flat bed without using bedrails?: A Lot Help needed moving to and from a bed to a chair (including a wheelchair)?: Total Help needed standing up from a chair using your arms (e.g., wheelchair or bedside chair)?: Total Help needed to walk in hospital room?: Total Help needed climbing 3-5 steps with a railing? : Total 6 Click Score: 9    End of Session   Activity Tolerance: Patient tolerated treatment well Patient left: in bed;with call bell/phone within reach;with bed alarm set Nurse Communication: Mobility status;Need for lift equipment PT Visit Diagnosis: Repeated falls (R29.6);Muscle weakness (generalized) (M62.81);Other abnormalities of gait and mobility (R26.89)    Time: 1191-4782 PT Time Calculation (min) (ACUTE ONLY): 27 min   Charges:   PT Evaluation $PT Eval Moderate Complexity: 1 Mod PT  Treatments $Therapeutic Activity: 8-22 mins PT General Charges $$ ACUTE PT VISIT: 1 Visit         Merryl Hacker, PT Acute Rehabilitation Services Office: [phone]   Enedina Finner  04/25/2023, 2:25 PM

## 2023-04-25 NOTE — Care Management Obs Status (Cosign Needed)
MEDICARE OBSERVATION STATUS NOTIFICATION   Patient Details  Name: Micheal Moore. MRN: [id number] Date of Birth: [birth year]   Medicare Observation Status Notification Given:  Yes    Janae Bridgeman, RN 04/25/2023, 12:20 PM

## 2023-04-25 NOTE — TOC Initial Note (Signed)
Transition of Care (TOC) - Initial/Assessment Note    Patient Details  Name: Micheal Moore. MRN: [id number] Date of Birth: 03/08/1951  Transition of Care [facility]) CM/SW Contact:    Janae Bridgeman, RN Phone Number: 04/25/2023, 1:04 PM  Clinical Narrative:                 CM met with the patient at the bedside to discuss TOC needs.  The patient readmitted to the hospital for weakness and septicemia.  Patient states that his wife was unable to handle administration of IV antibiotics at the home and patient return to the hospital for care and need for SNF admission.  UR RN asked that Code 44 be presented to the patient.  Medicare Observation notice was provided to the patient and Code 44 was completed in EPIC as requested.  SNF workup will be started and UR and attending MD that workup is in process for placement.  Patient has Right single-lumen PICC line in place.  SNF workup will be started and MSW will follow up with the patient for bed offers tomorrow when beds available in the hub.  Patient is agreeable to SNF placement.  DME at the home includes WC, RW, diapers, urinal and Glucometer.  The patient states that he has been unable to walk for the past 7 months and is bed-bound/WC bound at home.  TOC Team will continue to follow the patient for SNF placement process.  Expected Discharge Plan: Skilled Nursing Facility Barriers to Discharge: Continued Medical Work up   Patient Goals and CMS Choice Patient states their goals for this hospitalization and ongoing recovery are:: To go to Rehab/ SNF for IV antibiotics CMS Medicare.gov Compare Post Acute Care list provided to:: Patient Choice offered to / list presented to : Patient [place] ownership interest in [employer].provided to:: Patient    Expected Discharge Plan and Services   Discharge Planning Services: CM Consult Post Acute Care Choice: Skilled Nursing Facility Living arrangements for the past 2  months: Single Family Home                                      Prior Living Arrangements/Services Living arrangements for the past 2 months: Single Family Home Lives with:: Spouse Patient language and need for interpreter reviewed:: Yes Do you feel safe going back to the place where you live?: No   Wife unable to provide IV antibiotics at home  Need for Family Participation in Patient Care: Yes (Comment) Care giver support system in place?: Yes (comment) Current home services: DME, Home RN (WC, urinal, diapers, RW, Glucometer, IV antibiotics thru PICC line prior to hospital admission (wife unable to continue)) Criminal Activity/Legal Involvement Pertinent to Current Situation/Hospitalization: No - Comment as needed  Activities of Daily Living Home Assistive Devices/Equipment: Wheelchair, Eyeglasses ADL Screening (condition at time of admission) Patient's cognitive ability adequate to safely complete daily activities?: No Is the patient deaf or have difficulty hearing?: Yes Does the patient have difficulty seeing, even when wearing glasses/contacts?: Yes Does the patient have difficulty concentrating, remembering, or making decisions?: No Patient able to express need for assistance with ADLs?: Yes Does the patient have difficulty dressing or bathing?: Yes Independently performs ADLs?: No Communication: Independent Dressing (OT): Needs assistance Is this a change from baseline?: Pre-admission baseline Grooming: Needs assistance Is this a change from baseline?: Pre-admission baseline Feeding: Dependent Is this  a change from baseline?: Pre-admission baseline Bathing: Needs assistance Is this a change from baseline?: Pre-admission baseline Toileting: Dependent Is this a change from baseline?: Pre-admission baseline In/Out Bed: Dependent Is this a change from baseline?: Pre-admission baseline Walks in Home: Dependent Is this a change from baseline?: Pre-admission  baseline Does the patient have difficulty walking or climbing stairs?: Yes Weakness of Legs: Both Weakness of Arms/Hands: Both  Permission Sought/Granted Permission sought to share information with : Case Manager, Oceanographer granted to share information with : Yes, Verbal Permission Granted     Permission granted to share info w AGENCY: SNF facility        Emotional Assessment Appearance:: Appears stated age Attitude/Demeanor/Rapport: Gracious Affect (typically observed): Accepting Orientation: : Oriented to Self, Oriented to Place, Oriented to  Time, Oriented to Situation Alcohol / Substance Use: Tobacco Use Psych Involvement: No (comment)  Admission diagnosis:  Weakness [R53.1] Ambulatory dysfunction [R26.2] Patient Active Problem List   Diagnosis Date Noted   Weakness 04/24/2023   Medication management 04/16/2023   Bacteremia 04/12/2023   Aspiration pneumonia (HCC) 04/11/2023   Pressure injury of skin 04/11/2023   Nausea and vomiting 04/11/2023   Elevated troponin 04/11/2023   CAD (coronary artery disease) 04/11/2023   Hypokalemia 04/11/2023   PVD (peripheral vascular disease) (HCC) 04/11/2023   Debility 04/11/2023   Hyperglycemia 01/16/2023   STEMI (ST elevation myocardial infarction) (HCC) 01/15/2023   Diabetic ketoacidosis without coma associated with type 2 diabetes mellitus (HCC) 01/15/2023   Anterior dislocation of left hip (HCC) 08/24/2022   Closed dislocation of left hip (HCC) 08/23/2022   Normocytic anemia 08/23/2022   Malnutrition of moderate degree 07/20/2022   Fall    Orthostatic hypotension    Hip fracture (HCC) 02/22/2022   Hyperlipidemia 02/22/2022   Chronic diastolic CHF (congestive heart failure) (HCC) 02/22/2022   Left great toe amputee (HCC) 05/06/2020   Cellulitis in diabetic foot (HCC) 04/27/2020   Osteomyelitis of great toe of left foot (HCC) 04/27/2020   Type 2 diabetes mellitus with hyperlipidemia (HCC)  04/27/2020   History of DVT (deep vein thrombosis) 04/27/2020   Tobacco abuse 04/27/2020   PCP:  [name], MD Pharmacy:   [id number] - [place] - [address] [place] [address] Phone: [phone] Fax: [phone]  [id number] - [place], [place] - [address] AT [facility] [address]  [place] [address] Phone: [phone] Fax: [phone]  [facility] [address] [place] [address] Phone: [phone] Fax: [phone]     Social Determinants of Health (SDOH) Social History: SDOH Screenings   Food Insecurity: No Food Insecurity (04/24/2023)  Housing: Low Risk  (04/24/2023)  Transportation Needs: No Transportation Needs (04/24/2023)  Utilities: Not At Risk (04/24/2023)  Tobacco Use: High Risk (04/24/2023)   SDOH Interventions:     Readmission Risk Interventions    04/25/2023    1:04 PM 04/12/2023    4:47 PM 07/25/2022    2:42 PM  Readmission Risk Prevention Plan  Transportation Screening Complete Complete   PCP or Specialist Appt within 5-7 Days     PCP or Specialist Appt within 3-5 Days Complete Complete   Home Care Screening     Medication Review (RN CM)     HRI or Home Care Consult Complete Complete   Social Work Consult for Recovery Care Planning/Counseling Complete Complete   Palliative Care Screening Complete Not  Applicable   Medication Review (RN Care Manager) Complete Referral to Pharmacy      Information is confidential and restricted. Go to Review Flowsheets to unlock data.

## 2023-04-25 NOTE — [place] FL2 (Signed)
[place] MEDICAID FL2 LEVEL OF CARE FORM     IDENTIFICATION  Patient Name: Micheal Moore. Birthdate: July 09, 1951 Sex: male Admission Date (Current Location): 04/24/2023  [facility] and [place] Number:  Producer, television/film/video and Address:  [facility] [place], [address] [place], [place] [address]      Provider Number: [id number]  Attending Physician Name and Address:  Burnadette Pop, MD  Relative Name and Phone Number:  Trebor Walcutt, spouse - [phone]    Current Level of Care: Hospital Recommended Level of Care: Skilled Nursing Facility Prior Approval Number:    Date Approved/Denied:   PASRR Number: [id number]  Discharge Plan: SNF    Current Diagnoses: Patient Active Problem List   Diagnosis Date Noted   Weakness 04/24/2023   Medication management 04/16/2023   Bacteremia 04/12/2023   Aspiration pneumonia (HCC) 04/11/2023   Pressure injury of skin 04/11/2023   Nausea and vomiting 04/11/2023   Elevated troponin 04/11/2023   CAD (coronary artery disease) 04/11/2023   Hypokalemia 04/11/2023   PVD (peripheral vascular disease) (HCC) 04/11/2023   Debility 04/11/2023   Hyperglycemia 01/16/2023   STEMI (ST elevation myocardial infarction) (HCC) 01/15/2023   Diabetic ketoacidosis without coma associated with type 2 diabetes mellitus (HCC) 01/15/2023   Anterior dislocation of left hip (HCC) 08/24/2022   Closed dislocation of left hip (HCC) 08/23/2022   Normocytic anemia 08/23/2022   Malnutrition of moderate degree 07/20/2022   Fall    Orthostatic hypotension    Hip fracture (HCC) 02/22/2022   Hyperlipidemia 02/22/2022   Chronic diastolic CHF (congestive heart failure) (HCC) 02/22/2022   Left great toe amputee (HCC) 05/06/2020   Cellulitis in diabetic foot (HCC) 04/27/2020   Osteomyelitis of great toe of left foot (HCC) 04/27/2020   Type 2 diabetes mellitus with hyperlipidemia (HCC) 04/27/2020   History of DVT (deep vein  thrombosis) 04/27/2020   Tobacco abuse 04/27/2020    Orientation RESPIRATION BLADDER Height & Weight     Self, Time, Situation, Place  Normal Continent Weight: 86.2 kg Height:  6\' 5"  (195.6 cm)  BEHAVIORAL SYMPTOMS/MOOD NEUROLOGICAL BOWEL NUTRITION STATUS      Continent Diet (See discharge summary)  AMBULATORY STATUS COMMUNICATION OF NEEDS Skin   Total Care Verbally Other (Comment) (Stage 2 - mid-sacrum, irritant dermatitis bilateral buttocks, diabetic ulcer to Left heel - foam dressing, Left distal pre-tibial wound - foam dressing)                       Personal Care Assistance Level of Assistance  Bathing, Feeding, Dressing Bathing Assistance: Maximum assistance Feeding assistance: Limited assistance Dressing Assistance: Maximum assistance     Functional Limitations Info  Sight, Hearing, Speech Sight Info: Impaired Hearing Info: Adequate Speech Info: Adequate    SPECIAL CARE FACTORS FREQUENCY   (Needs IV antibiotics through Right single lumen PICC - Daptomycin as ordered)                    Contractures Contractures Info: Not present    Additional Factors Info  Code Status, Allergies, Psychotropic, Insulin Sliding Scale, Isolation Precautions Code Status Info: Full code Allergies Info: NKDA Psychotropic Info: Trazodone Insulin Sliding Scale Info: Novolog Insulin - sensitive sliding scale 3 x per day with meals and Q HS Isolation Precautions Info: contact precautions - MRSA     Current Medications (04/25/2023):  This is the current hospital active medication list Current Facility-Administered Medications  Medication Dose Route Frequency Provider Last  Rate Last Admin   acetaminophen (TYLENOL) tablet 650 mg  650 mg Oral Q6H PRN Dimple Nanas, MD   650 mg at 04/24/23 1712   Or   acetaminophen (TYLENOL) suppository 650 mg  650 mg Rectal Q6H PRN Amin, Loura Halt, MD       aspirin EC tablet 81 mg  81 mg Oral Daily Amin, Ankit Chirag, MD   81 mg at 04/25/23  0752   bisacodyl (DULCOLAX) suppository 10 mg  10 mg Rectal Once Burnadette Pop, MD       DAPTOmycin (CUBICIN) 700 mg in sodium chloride 0.9 % IVPB  700 mg Intravenous Q1400 Burnadette Pop, MD   Stopped at 04/24/23 1257   fludrocortisone (FLORINEF) tablet 0.2 mg  0.2 mg Oral Daily Amin, Ankit Chirag, MD   0.2 mg at 04/25/23 0800   guaiFENesin (ROBITUSSIN) 100 MG/5ML liquid 5 mL  5 mL Oral Q4H PRN Amin, Ankit Chirag, MD       heparin injection 5,000 Units  5,000 Units Subcutaneous Q8H Amin, Ankit Chirag, MD   5,000 Units at 04/25/23 0639   hydrALAZINE (APRESOLINE) injection 10 mg  10 mg Intravenous Q4H PRN Amin, Ankit Chirag, MD       insulin aspart (novoLOG) injection 0-5 Units  0-5 Units Subcutaneous QHS Amin, Ankit Chirag, MD       insulin aspart (novoLOG) injection 0-9 Units  0-9 Units Subcutaneous TID WC Amin, Ankit Chirag, MD   1 Units at 04/25/23 1220   insulin aspart (novoLOG) injection 2 Units  2 Units Subcutaneous TID WC Amin, Ankit Chirag, MD   2 Units at 04/25/23 1220   insulin glargine-yfgn (SEMGLEE) injection 10 Units  10 Units Subcutaneous QHS Amin, Ankit Chirag, MD   10 Units at 04/24/23 2238   ipratropium-albuterol (DUONEB) 0.5-2.5 (3) MG/3ML nebulizer solution 3 mL  3 mL Nebulization Q4H PRN Amin, Ankit Chirag, MD       metoprolol tartrate (LOPRESSOR) injection 5 mg  5 mg Intravenous Q4H PRN Amin, Ankit Chirag, MD       midodrine (PROAMATINE) tablet 2.5 mg  2.5 mg Oral TID WC Amin, Ankit Chirag, MD       ondansetron (ZOFRAN) tablet 4 mg  4 mg Oral Q6H PRN Amin, Ankit Chirag, MD       Or   ondansetron (ZOFRAN) injection 4 mg  4 mg Intravenous Q6H PRN Amin, Ankit Chirag, MD       pneumococcal 20-valent conjugate vaccine (PREVNAR 20) injection 0.5 mL  0.5 mL Intramuscular Prior to discharge Amin, Ankit Chirag, MD       polyethylene glycol (MIRALAX / GLYCOLAX) packet 17 g  17 g Oral Daily Adhikari, Amrit, MD   17 g at 04/25/23 0950   rosuvastatin (CRESTOR) tablet 5 mg  5 mg Oral  Once per day on Monday Thursday Amin, Ankit [place], MD   5 mg at 04/25/23 0751   senna (SENOKOT) tablet 8.6 mg  1 tablet Oral BID Burnadette Pop, MD   8.6 mg at 04/25/23 0949   senna-docusate (Senokot-S) tablet 1 tablet  1 tablet Oral QHS PRN Amin, Ankit Chirag, MD       ticagrelor (BRILINTA) tablet 90 mg  90 mg Oral BID Amin, Ankit Chirag, MD   90 mg at 04/25/23 0950   traZODone (DESYREL) tablet 50 mg  50 mg Oral QHS PRN Amin, Ankit Chirag, MD       Vitamin D (Ergocalciferol) (DRISDOL) 1.25 MG (50000 UNIT) capsule 50,000 Units  50,000 Units  Oral Q7 days Burnadette Pop, MD   50,000 Units at 04/25/23 1100     Discharge Medications: Please see discharge summary for a list of discharge medications.  Relevant Imaging Results:  Relevant Lab Results:   Additional Information SS# [id number]  Janae Bridgeman, RN

## 2023-04-25 NOTE — Plan of Care (Signed)

## 2023-04-25 NOTE — [place] FL2 (Signed)
[place] MEDICAID FL2 LEVEL OF CARE FORM     IDENTIFICATION  Patient Name: Micheal Moore. Birthdate: Oct 31, 1950 Sex: male Admission Date (Current Location): 04/24/2023  [facility] and [place] Number:  Producer, television/film/video and Address:  [facility] [place], [address] [place], [place] [address]      Provider Number: [id number]  Attending Physician Name and Address:  Burnadette Pop, MD  Relative Name and Phone Number:  Lynkoln Weigman, spouse - [phone]    Current Level of Care: Hospital Recommended Level of Care: Skilled Nursing Facility Prior Approval Number:    Date Approved/Denied:   PASRR Number: [id number]  Discharge Plan: SNF    Current Diagnoses: Patient Active Problem List   Diagnosis Date Noted   Weakness 04/24/2023   Medication management 04/16/2023   Bacteremia 04/12/2023   Aspiration pneumonia (HCC) 04/11/2023   Pressure injury of skin 04/11/2023   Nausea and vomiting 04/11/2023   Elevated troponin 04/11/2023   CAD (coronary artery disease) 04/11/2023   Hypokalemia 04/11/2023   PVD (peripheral vascular disease) (HCC) 04/11/2023   Debility 04/11/2023   Hyperglycemia 01/16/2023   STEMI (ST elevation myocardial infarction) (HCC) 01/15/2023   Diabetic ketoacidosis without coma associated with type 2 diabetes mellitus (HCC) 01/15/2023   Anterior dislocation of left hip (HCC) 08/24/2022   Closed dislocation of left hip (HCC) 08/23/2022   Normocytic anemia 08/23/2022   Malnutrition of moderate degree 07/20/2022   Fall    Orthostatic hypotension    Hip fracture (HCC) 02/22/2022   Hyperlipidemia 02/22/2022   Chronic diastolic CHF (congestive heart failure) (HCC) 02/22/2022   Left great toe amputee (HCC) 05/06/2020   Cellulitis in diabetic foot (HCC) 04/27/2020   Osteomyelitis of great toe of left foot (HCC) 04/27/2020   Type 2 diabetes mellitus with hyperlipidemia (HCC) 04/27/2020   History of DVT (deep vein  thrombosis) 04/27/2020   Tobacco abuse 04/27/2020    Orientation RESPIRATION BLADDER Height & Weight     Self, Time, Situation, Place  Normal Continent Weight: 86.2 kg Height:  6\' 5"  (195.6 cm)  BEHAVIORAL SYMPTOMS/MOOD NEUROLOGICAL BOWEL NUTRITION STATUS      Continent Diet (See discharge summary)  AMBULATORY STATUS COMMUNICATION OF NEEDS Skin   Total Care Verbally Other (Comment) (Stage 2 - mid-sacrum, irritant dermatitis bilateral buttocks, diabetic ulcer to Left heel - foam dressing, Left distal pre-tibial wound - foam dressing)                       Personal Care Assistance Level of Assistance  Bathing, Feeding, Dressing Bathing Assistance: Maximum assistance Feeding assistance: Limited assistance Dressing Assistance: Maximum assistance     Functional Limitations Info  Sight, Hearing, Speech Sight Info: Impaired Hearing Info: Adequate Speech Info: Adequate    SPECIAL CARE FACTORS FREQUENCY                       Contractures Contractures Info: Not present    Additional Factors Info  Code Status, Allergies, Psychotropic, Insulin Sliding Scale, Isolation Precautions Code Status Info: Full code Allergies Info: NKDA Psychotropic Info: Trazodone Insulin Sliding Scale Info: Novolog Insulin - sensitive sliding scale 3 x per day with meals and Q HS Isolation Precautions Info: contact precautions - MRSA     Current Medications (04/25/2023):  This is the current hospital active medication list Current Facility-Administered Medications  Medication Dose Route Frequency Provider Last Rate Last Admin   acetaminophen (TYLENOL) tablet 650 mg  650 mg Oral Q6H PRN Dimple Nanas, MD   650 mg at 04/24/23 1712   Or   acetaminophen (TYLENOL) suppository 650 mg  650 mg Rectal Q6H PRN Amin, Loura Halt, MD       aspirin EC tablet 81 mg  81 mg Oral Daily Amin, Ankit Chirag, MD   81 mg at 04/25/23 0752   bisacodyl (DULCOLAX) suppository 10 mg  10 mg Rectal Once Burnadette Pop, MD       DAPTOmycin (CUBICIN) 700 mg in sodium chloride 0.9 % IVPB  700 mg Intravenous Q1400 Burnadette Pop, MD   Stopped at 04/24/23 1257   fludrocortisone (FLORINEF) tablet 0.2 mg  0.2 mg Oral Daily Amin, Ankit Chirag, MD   0.2 mg at 04/25/23 0800   guaiFENesin (ROBITUSSIN) 100 MG/5ML liquid 5 mL  5 mL Oral Q4H PRN Amin, Ankit Chirag, MD       heparin injection 5,000 Units  5,000 Units Subcutaneous Q8H Amin, Ankit Chirag, MD   5,000 Units at 04/25/23 0639   hydrALAZINE (APRESOLINE) injection 10 mg  10 mg Intravenous Q4H PRN Amin, Ankit Chirag, MD       insulin aspart (novoLOG) injection 0-5 Units  0-5 Units Subcutaneous QHS Amin, Ankit Chirag, MD       insulin aspart (novoLOG) injection 0-9 Units  0-9 Units Subcutaneous TID WC Amin, Ankit Chirag, MD   1 Units at 04/25/23 1220   insulin aspart (novoLOG) injection 2 Units  2 Units Subcutaneous TID WC Amin, Ankit Chirag, MD   2 Units at 04/25/23 1220   insulin glargine-yfgn (SEMGLEE) injection 10 Units  10 Units Subcutaneous QHS Amin, Ankit Chirag, MD   10 Units at 04/24/23 2238   ipratropium-albuterol (DUONEB) 0.5-2.5 (3) MG/3ML nebulizer solution 3 mL  3 mL Nebulization Q4H PRN Amin, Ankit Chirag, MD       metoprolol tartrate (LOPRESSOR) injection 5 mg  5 mg Intravenous Q4H PRN Amin, Ankit Chirag, MD       midodrine (PROAMATINE) tablet 2.5 mg  2.5 mg Oral TID WC Amin, Ankit Chirag, MD       ondansetron (ZOFRAN) tablet 4 mg  4 mg Oral Q6H PRN Amin, Ankit Chirag, MD       Or   ondansetron (ZOFRAN) injection 4 mg  4 mg Intravenous Q6H PRN Amin, Ankit Chirag, MD       pneumococcal 20-valent conjugate vaccine (PREVNAR 20) injection 0.5 mL  0.5 mL Intramuscular Prior to discharge Amin, Ankit Chirag, MD       polyethylene glycol (MIRALAX / GLYCOLAX) packet 17 g  17 g Oral Daily Adhikari, Amrit, MD   17 g at 04/25/23 0950   rosuvastatin (CRESTOR) tablet 5 mg  5 mg Oral Once per day on Monday Thursday Amin, Ankit [place], MD   5 mg at 04/25/23 0751    senna (SENOKOT) tablet 8.6 mg  1 tablet Oral BID Burnadette Pop, MD   8.6 mg at 04/25/23 0949   senna-docusate (Senokot-S) tablet 1 tablet  1 tablet Oral QHS PRN Amin, Ankit Chirag, MD       ticagrelor (BRILINTA) tablet 90 mg  90 mg Oral BID Amin, Ankit Chirag, MD   90 mg at 04/25/23 0950   traZODone (DESYREL) tablet 50 mg  50 mg Oral QHS PRN Amin, Ankit Chirag, MD       Vitamin D (Ergocalciferol) (DRISDOL) 1.25 MG (50000 UNIT) capsule 50,000 Units  50,000 Units Oral Q7 days Burnadette Pop, MD   50,000 Units at  04/25/23 1100     Discharge Medications: Please see discharge summary for a list of discharge medications.  Relevant Imaging Results:  Relevant Lab Results:   Additional Information SS# [id number]  Janae Bridgeman, RN

## 2023-04-25 NOTE — Progress Notes (Addendum)
PROGRESS NOTE  [name].  VQQ:[id number] DOB: January 03, 1951 DOA: 04/24/2023 PCP: [name], MD   Brief Narrative: Patient is a 72 year old male with history of chronic hypotension, recent bacteremia, coronary artery disease, chronic ambulatory dysfunction,diabetes type 2, peripheral vascular disease who presented with weakness from home.  He was recently admitted here from 7/24 - 8/5 when he was treated for length MRSA, [facility]  bacteremia.  Hospital course also remarkable for aspiration pneumonia.  Patient went home with PICC line for IV antibiotics.  Complain of feeling weak for 2 to 3 days prompting him to return to the hospital.  Patient is currently hemodynamically stable.  PT evaluation pending.  Patient is agreeable for SNF this time  Assessment & Plan:  Principal Problem:   Weakness Active Problems:   Hip fracture (HCC)   Aspiration pneumonia (HCC)   CAD (coronary artery disease)   Type 2 diabetes mellitus with hyperlipidemia (HCC)  Generalized weakness/debility/deconditioning: Recently admitted for bacteremia.  On last admission he was recommended SNF but he wanted to go home.  PT/OT consulted.  Low vitamin D noted, started on supplementation.  Patient has chronic ambulatory dysfunction and hasnot walked for last 9 months.  Polymicrobial bacteremia: He was recently admitted here from 7/24 - 8/5 when he was treated for  MRSA, [facility] for bacteremia.  TEE was negative for vegetation.  PICC line was placed.  The last day of daptomycin is 8/23.  Soon completing course of Cipro  Chronic hypotension: On midodrine, Florinef  History of coronary artery  disease: Status post PCI in April 24.  On aspirin, Brilinta  Insulin-dependent DM type II: Monitor blood sugars.  History of peripheral artery disease: Aspirin, Brilinta  Normocytic anemia: Currently hemoglobin stable  Hypokalemia: Supplemented with potassium  Constipation: Continue bowel regimen          DVT prophylaxis:heparin injection 5,000 Units Start: 04/24/23 1445     Code Status: Full Code  Family Communication: Called and discussed with wife on phone on 8/8  Patient status:Inpatient  Patient is from :home  Anticipated discharge to:SNF  Estimated DC date:1-2 days   Consultants: None  Procedures:None  Antimicrobials:  Anti-infectives (From admission, onward)    Start     Dose/Rate Route Frequency Ordered Stop   04/24/23 2000  ciprofloxacin (CIPRO) tablet 750 mg        750 mg Oral 2 times daily 04/24/23 1346 04/25/23 1959   04/24/23 1445  daptomycin (CUBICIN) IVPB  Status:  Discontinued       Note to Pharmacy: Indication:  MRSA bacteremia First Dose: Yes Last Day of Therapy:  05/10/23   700 mg Intravenous Every 24 hours 04/24/23 1346 04/24/23 1350   04/24/23 1100  DAPTOmycin (CUBICIN) 700 mg in sodium chloride 0.9 % IVPB        700 mg 128 mL/hr over 30 Minutes Intravenous Daily 04/24/23 1023 05/10/23 2359       Subjective: Patient seen and examined at bedside today.  Hemodynamically stable.  Lying comfortably in bed.  He feels much better today.  Denies any acute issues besides no bowel movement for last few days.  Says that he has not ambulated for last several years and is mostly bedbound.  Lives with wife.  Agreeable for SNF  Objective: Vitals:   04/24/23 1637 04/24/23 1939 04/25/23 0543 04/25/23 0717  BP: (!) 174/106 (!) 149/82 134/87 (!) 168/86  Pulse: 92 93 91 98  Resp: 17 19 17 16   Temp: 98.2 F (36.8 C) 98  F (36.7 C) 97.7 F (36.5 C) 98.1 F (36.7 C)  TempSrc: Oral Oral Oral Oral  SpO2: 100% 100% 99% 100%  Weight:      Height:        Intake/Output Summary (Last 24 hours) at 04/25/2023 0739 Last data filed at 04/25/2023 0559 Gross per 24 hour  Intake 779.93 ml  Output 1300 ml  Net -520.07 ml   Filed Weights   04/24/23 1015  Weight: 86.2 kg    Examination:  General exam: Overall comfortable, not in distress, deconditioned HEENT:  PERRL Respiratory system:  no wheezes or crackles  Cardiovascular system: S1 & S2 heard, RRR.  Gastrointestinal system: Abdomen is nondistended, soft and nontender. Central nervous system: Alert and oriented Extremities: No edema, no clubbing ,no cyanosis Skin: No rashes,no icterus , shallow ulcer on the left leg   Data Reviewed: I have personally reviewed following labs and imaging studies  CBC: Recent Labs  Lab 04/24/23 1033 04/24/23 2315  WBC 9.5 7.9  NEUTROABS 6.7  --   HGB 12.3* 11.3*  HCT 39.1 35.4*  MCV 99.5 94.9  PLT 342 312   Basic Metabolic Panel: Recent Labs  Lab 04/20/23 0453 04/24/23 1033 04/24/23 2315  NA 137 138 138  K 3.6 3.8 3.2*  CL 100 101 102  CO2 28 22 23   GLUCOSE 155* 192* 134*  BUN 14 19 18   CREATININE 0.61 0.70 0.85  CALCIUM 8.9 9.2 9.1  MG  --   --  2.0     No results found for this or any previous visit (from the past 240 hour(s)).   Radiology Studies: No results found.  Scheduled Meds:  aspirin EC  81 mg Oral Daily   ciprofloxacin  750 mg Oral BID   fludrocortisone  0.2 mg Oral Daily   heparin  5,000 Units Subcutaneous Q8H   insulin aspart  0-5 Units Subcutaneous QHS   insulin aspart  0-9 Units Subcutaneous TID WC   insulin aspart  2 Units Subcutaneous TID WC   insulin glargine-yfgn  10 Units Subcutaneous QHS   midodrine  2.5 mg Oral TID WC   potassium chloride  40 mEq Oral Once   rosuvastatin  5 mg Oral Once per day on Monday Thursday   ticagrelor  90 mg Oral BID   Continuous Infusions:  DAPTOmycin (CUBICIN) 700 mg in sodium chloride 0.9 % IVPB Stopped (04/24/23 1257)     LOS: 1 day   Burnadette Pop, MD [facility] P8/04/2023, 7:39 AM

## 2023-04-25 NOTE — Care Management CC44 (Cosign Needed)
Condition Code 44 Documentation Completed  Patient Details  Name: Micheal Moore. MRN: [id number] Date of Birth: 02-13-1951   Condition Code 44 given:  Yes Patient signature on Condition Code 44 notice:  Yes Documentation of 2 MD's agreement:  Yes Code 44 added to claim:  Yes    Janae Bridgeman, RN 04/25/2023, 12:20 PM

## 2023-04-26 DIAGNOSIS — R531 Weakness: Secondary | ICD-10-CM | POA: Diagnosis not present

## 2023-04-26 LAB — GLUCOSE, CAPILLARY
Glucose-Capillary: 162 mg/dL — ABNORMAL HIGH (ref 70–99)
Glucose-Capillary: 130 mg/dL — ABNORMAL HIGH (ref 70–99)
Glucose-Capillary: 157 mg/dL — ABNORMAL HIGH (ref 70–99)
Glucose-Capillary: 127 mg/dL — ABNORMAL HIGH (ref 70–99)

## 2023-04-26 MED ORDER — CHLORHEXIDINE GLUCONATE CLOTH 2 % EX PADS
6.0000 | MEDICATED_PAD | Freq: Every day | CUTANEOUS | Status: DC
  Administered 2023-04-25 – 2023-05-04 (×10): 6 via TOPICAL

## 2023-04-26 MED ORDER — SODIUM CHLORIDE 0.9% FLUSH
10.0000 mL | Freq: Two times a day (BID) | INTRAVENOUS | Status: DC
  Administered 2023-04-26 – 2023-05-04 (×17): 10 mL

## 2023-04-26 MED ORDER — FLEET ENEMA 7-19 GM/118ML RE ENEM
1.0000 | ENEMA | Freq: Once | RECTAL | Status: AC
  Administered 2023-04-26: 1 via RECTAL
  Filled 2023-04-26: qty 1

## 2023-04-26 MED ORDER — SODIUM CHLORIDE 0.9% FLUSH: 10.0000 mL | INTRAVENOUS | Status: DC | PRN

## 2023-04-26 NOTE — TOC Progression Note (Addendum)
Transition of Care (TOC) - Progression Note    Patient Details  Name: Micheal Moore. MRN: [id number] Date of Birth: [birth year]  Transition of Care [facility]) CM/SW Contact   A [place], [place] Phone Number: 04/26/2023, 12:46 PM  Clinical Narrative:     CSW contacted pt's wife, Micheal Moore to confirm that she and pt  wanted to accept bed offer to [facility]. She said she selected that facility. Discharge pending medical stability, bed availability and insurance authorization.   TOC will continue to follow.   Expected Discharge Plan: Skilled Nursing Facility Barriers to Discharge: Continued Medical Work up  Expected Discharge Plan and Services   Discharge Planning Services: CM Consult Post Acute Care Choice: Skilled Nursing Facility Living arrangements for the past 2 months: Single Family Home                                       Social Determinants of Health (SDOH) Interventions SDOH Screenings   Food Insecurity: No Food Insecurity (04/24/2023)  Housing: Low Risk  (04/24/2023)  Transportation Needs: No Transportation Needs (04/24/2023)  Utilities: Not At Risk (04/24/2023)  Tobacco Use: High Risk (04/24/2023)    Readmission Risk Interventions    04/25/2023    1:04 PM 04/12/2023    4:47 PM 07/25/2022    2:42 PM  Readmission Risk Prevention Plan  Transportation Screening Complete Complete   PCP or Specialist Appt within 5-7 Days     PCP or Specialist Appt within 3-5 Days Complete Complete   Home Care Screening     Medication Review (RN CM)     HRI or Home Care Consult Complete Complete   Social Work Consult for Recovery Care Planning/Counseling Complete Complete   Palliative Care Screening Complete Not Applicable   Medication Review Oceanographer) Complete Referral to Pharmacy      Information is confidential and restricted. Go to Review Flowsheets to unlock data.

## 2023-04-26 NOTE — TOC Progression Note (Signed)
Transition of Care (TOC) - Progression Note    Patient Details  Name: Micheal Moore. MRN: [id number] Date of Birth: May 01, 1951  Transition of Care [facility]) CM/SW Contact  Erin Sons, [place] Phone Number: 04/26/2023, 3:41 PM  Clinical Narrative:     CSW attempted to initiate auth in online portal pt pt's insurance is not managed by [employer]. CSW notified [facility]; they have initiated SNF auth request.  Status: pending  Expected Discharge Plan: Skilled Nursing Facility Barriers to Discharge: Continued Medical Work up  Expected Discharge Plan and Services   Discharge Planning Services: CM Consult Post Acute Care Choice: Skilled Nursing Facility Living arrangements for the past 2 months: Single Family Home                                       Social Determinants of Health (SDOH) Interventions SDOH Screenings   Food Insecurity: No Food Insecurity (04/24/2023)  Housing: Low Risk  (04/24/2023)  Transportation Needs: No Transportation Needs (04/24/2023)  Utilities: Not At Risk (04/24/2023)  Tobacco Use: High Risk (04/24/2023)    Readmission Risk Interventions    04/25/2023    1:04 PM 04/12/2023    4:47 PM 07/25/2022    2:42 PM  Readmission Risk Prevention Plan  Transportation Screening Complete Complete   PCP or Specialist Appt within 5-7 Days     PCP or Specialist Appt within 3-5 Days Complete Complete   Home Care Screening     Medication Review (RN CM)     HRI or Home Care Consult Complete Complete   Social Work Consult for Recovery Care Planning/Counseling Complete Complete   Palliative Care Screening Complete Not Applicable   Medication Review Oceanographer) Complete Referral to Pharmacy      Information is confidential and restricted. Go to Review Flowsheets to unlock data.

## 2023-04-26 NOTE — Progress Notes (Signed)
PROGRESS NOTE  Micheal Moore.  ZOX:[id number] DOB: 11-27-1950 DOA: 04/24/2023 PCP: [name], MD   Brief Narrative: Patient is a 72 year old male with history of chronic hypotension, recent bacteremia, coronary artery disease, chronic ambulatory dysfunction,diabetes type 2, peripheral vascular disease who presented with weakness from home.  He was recently admitted here from 7/24 - 8/5 when he was treated for length MRSA, [facility]  bacteremia.  Hospital course also remarkable for aspiration pneumonia.  Patient went home with PICC line for IV antibiotics.  Complain of feeling weak for 2 to 3 days prompting him to return to the hospital.  Patient is currently hemodynamically stable.  Feels much better.PT evaluation done, recommended SNF.  Patient is agreeable for SNF this time.  Medically stable for discharge whenever possible.  Assessment & Plan:  Principal Problem:   Weakness Active Problems:   Hip fracture (HCC)   Aspiration pneumonia (HCC)   CAD (coronary artery disease)   Type 2 diabetes mellitus with hyperlipidemia (HCC)  Generalized weakness/debility/deconditioning: Recently admitted for bacteremia.  On last admission he was recommended SNF but he wanted to go home.  PT/OT consulted, recommended SNF.  Low vitamin D noted, started on supplementation.  Patient has chronic ambulatory dysfunction and hasnot walked for last 9 months.  Polymicrobial bacteremia: He was recently admitted here from 7/24 - 8/5 when he was treated for  MRSA, [facility] for bacteremia.  TEE was negative for vegetation.  PICC line was placed.  The last day of daptomycin is 8/23.  Completed course of Cipro  Chronic hypotension: On midodrine, Florinef  History of coronary artery  disease: Status post PCI in April 24.  On aspirin, Brilinta  Insulin-dependent DM type II: Monitor blood sugars.  Current insulin regimen  History of peripheral artery disease: Aspirin, Brilinta  Normocytic anemia:  Currently hemoglobin stable  Hypokalemia: Supplemented with potassium and corrected  Constipation: Continue bowel regimen,ordering fleet enema      Pressure Injury 04/11/23 Sacrum Mid Stage 2 -  Partial thickness loss of dermis presenting as a shallow open injury with a red, pink wound bed without slough. (Active)  04/11/23 0300  Location: Sacrum  Location Orientation: Mid  Staging: Stage 2 -  Partial thickness loss of dermis presenting as a shallow open injury with a red, pink wound bed without slough.  Wound Description (Comments):   Present on Admission: Yes  Dressing Type Foam - Lift dressing to assess site every shift 04/25/23 1942    DVT prophylaxis:heparin injection 5,000 Units Start: 04/24/23 1445     Code Status: Full Code  Family Communication: Called and discussed with wife on phone on 8/8  Patient status:Inpatient  Patient is from :home  Anticipated discharge to:SNF  Estimated DC date: Whenever possible   Consultants: None  Procedures:None  Antimicrobials:  Anti-infectives (From admission, onward)    Start     Dose/Rate Route Frequency Ordered Stop   04/24/23 2000  ciprofloxacin (CIPRO) tablet 750 mg        750 mg Oral 2 times daily 04/24/23 1346 04/25/23 0752   04/24/23 1445  daptomycin (CUBICIN) IVPB  Status:  Discontinued       Note to Pharmacy: Indication:  MRSA bacteremia First Dose: Yes Last Day of Therapy:  05/10/23   700 mg Intravenous Every 24 hours 04/24/23 1346 04/24/23 1350   04/24/23 1100  DAPTOmycin (CUBICIN) 700 mg in sodium chloride 0.9 % IVPB        700 mg 128 mL/hr over 30 Minutes Intravenous Daily  04/24/23 1023 05/10/23 2359       Subjective: Patient and examined at bedside today.  Hemodynamically stable comfortable.  Lying in bed.  No new complaints.  Had a small bowel yesterday.  Ordered fleet enema  today  Objective: Vitals:   04/25/23 1537 04/25/23 1942 04/26/23 0351 04/26/23 0738  BP: 135/88 132/83 121/82 132/81   Pulse: 97 92 88 91  Resp: 16 16 18 18   Temp: 98.6 F (37 C) 98.4 F (36.9 C) 99 F (37.2 C) 98 F (36.7 C)  TempSrc: Oral Oral    SpO2: 100% 99% 99% 100%  Weight:      Height:        Intake/Output Summary (Last 24 hours) at 04/26/2023 1108 Last data filed at 04/26/2023 0741 Gross per 24 hour  Intake 473 ml  Output 950 ml  Net -477 ml   Filed Weights   04/24/23 1015  Weight: 86.2 kg    Examination:   General exam: Overall comfortable, not in distress, looks deconditioned/debilitated HEENT: PERRL Respiratory system:  no wheezes or crackles  Cardiovascular system: S1 & S2 heard, RRR.  Gastrointestinal system: Abdomen is nondistended, soft and nontender. Central nervous system: Alert and oriented Extremities: No edema, no clubbing ,no cyanosis Skin: No rashes, no icterus  , shallow ulcer on the left leg, left big toe amputation  Pressure Injury 04/11/23 Sacrum Mid Stage 2 -  Partial thickness loss of dermis presenting as a shallow open injury with a red, pink wound bed without slough. (Active)  04/11/23 0300  Location: Sacrum  Location Orientation: Mid  Staging: Stage 2 -  Partial thickness loss of dermis presenting as a shallow open injury with a red, pink wound bed without slough.  Wound Description (Comments):   Present on Admission: Yes       Data Reviewed: I have personally reviewed following labs and imaging studies  CBC: Recent Labs  Lab 04/24/23 1033 04/24/23 2315  WBC 9.5 7.9  NEUTROABS 6.7  --   HGB 12.3* 11.3*  HCT 39.1 35.4*  MCV 99.5 94.9  PLT 342 312   Basic Metabolic Panel: Recent Labs  Lab 04/20/23 0453 04/24/23 1033 04/24/23 2315 04/26/23 0801  NA 137 138 138 137  K 3.6 3.8 3.2* 3.6  CL 100 101 102 101  CO2 28 22 23 25   GLUCOSE 155* 192* 134* 171*  BUN 14 19 18 16   CREATININE 0.61 0.70 0.85 0.72  CALCIUM 8.9 9.2 9.1 9.1  MG  --   --  2.0  --      No results found for this or any previous visit (from the past 240 hour(s)).    Radiology Studies: No results found.  Scheduled Meds:  aspirin EC  81 mg Oral Daily   bisacodyl  10 mg Rectal Once   Chlorhexidine Gluconate Cloth  6 each Topical Daily   fludrocortisone  0.2 mg Oral Daily   heparin  5,000 Units Subcutaneous Q8H   insulin aspart  0-5 Units Subcutaneous QHS   insulin aspart  0-9 Units Subcutaneous TID WC   insulin aspart  2 Units Subcutaneous TID WC   insulin glargine-yfgn  10 Units Subcutaneous QHS   midodrine  2.5 mg Oral TID WC   polyethylene glycol  17 g Oral Daily   rosuvastatin  5 mg Oral Once per day on Monday Thursday   senna  1 tablet Oral BID   sodium chloride flush  10-40 mL Intracatheter Q12H   ticagrelor  90 mg  Oral BID   Vitamin D (Ergocalciferol)  50,000 Units Oral Q7 days   Continuous Infusions:  DAPTOmycin (CUBICIN) 700 mg in sodium chloride 0.9 % IVPB 700 mg (04/25/23 1614)     LOS: 1 day   Burnadette Pop, MD [facility] P8/05/2023, 11:08 AM

## 2023-04-26 NOTE — Plan of Care (Signed)

## 2023-04-26 NOTE — TOC Progression Note (Signed)
Transition of Care (TOC) - Progression Note    Patient Details  Name: Micheal Moore. MRN: [id number] Date of Birth: 28-Jun-1951  Transition of Care [facility]) CM/SW Contact   A [place], [place] Phone Number: 04/26/2023, 11:09 AM  Clinical Narrative:     CSW met with pt at bedside to present bed offers. He said his wife would be at bedside around 1pm today and they would discuss and make a decision on beds.   TOC will continue to follow.   Expected Discharge Plan: Skilled Nursing Facility Barriers to Discharge: Continued Medical Work up  Expected Discharge Plan and Services   Discharge Planning Services: CM Consult Post Acute Care Choice: Skilled Nursing Facility Living arrangements for the past 2 months: Single Family Home                                       Social Determinants of Health (SDOH) Interventions SDOH Screenings   Food Insecurity: No Food Insecurity (04/24/2023)  Housing: Low Risk  (04/24/2023)  Transportation Needs: No Transportation Needs (04/24/2023)  Utilities: Not At Risk (04/24/2023)  Tobacco Use: High Risk (04/24/2023)    Readmission Risk Interventions    04/25/2023    1:04 PM 04/12/2023    4:47 PM 07/25/2022    2:42 PM  Readmission Risk Prevention Plan  Transportation Screening Complete Complete   PCP or Specialist Appt within 5-7 Days     PCP or Specialist Appt within 3-5 Days Complete Complete   Home Care Screening     Medication Review (RN CM)     HRI or Home Care Consult Complete Complete   Social Work Consult for Recovery Care Planning/Counseling Complete Complete   Palliative Care Screening Complete Not Applicable   Medication Review Oceanographer) Complete Referral to Pharmacy      Information is confidential and restricted. Go to Review Flowsheets to unlock data.

## 2023-04-27 DIAGNOSIS — R531 Weakness: Secondary | ICD-10-CM | POA: Diagnosis not present

## 2023-04-27 LAB — GLUCOSE, CAPILLARY
Glucose-Capillary: 185 mg/dL — ABNORMAL HIGH (ref 70–99)
Glucose-Capillary: 214 mg/dL — ABNORMAL HIGH (ref 70–99)
Glucose-Capillary: 155 mg/dL — ABNORMAL HIGH (ref 70–99)

## 2023-04-27 NOTE — Plan of Care (Signed)

## 2023-04-27 NOTE — Progress Notes (Signed)
PROGRESS NOTE  [name].  WGN:[id number] DOB: 1950/11/11 DOA: 04/24/2023 PCP: [name], MD   Brief Narrative: Patient is a 72 year old male with history of chronic hypotension, recent bacteremia, coronary artery disease, chronic ambulatory dysfunction,diabetes type 2, peripheral vascular disease who presented with weakness from home.  He was recently admitted here from 7/24 - 8/5 when he was treated for length MRSA, [facility]  bacteremia.  Hospital course also remarkable for aspiration pneumonia.  Patient went home with PICC line for IV antibiotics.  Complain of feeling weak for 2 to 3 days prompting him to return to the hospital.  Patient is currently hemodynamically stable.  Feels much better.PT evaluation done, recommended SNF.  Patient is agreeable for SNF this time.  Medically stable for discharge whenever possible.  Assessment & Plan:  Principal Problem:   Weakness Active Problems:   Hip fracture (HCC)   Aspiration pneumonia (HCC)   CAD (coronary artery disease)   Type 2 diabetes mellitus with hyperlipidemia (HCC)  Generalized weakness/debility/deconditioning: Recently admitted for bacteremia.  On last admission he was recommended SNF but he wanted to go home.  PT/OT consulted, recommended SNF.  Low vitamin D noted, started on supplementation.  Patient has chronic ambulatory dysfunction and hasnot walked for last 9 months.  Polymicrobial bacteremia: He was recently admitted here from 7/24 - 8/5 when he was treated for  MRSA, [facility] for bacteremia.  TEE was negative for vegetation.  PICC line was placed.  The last day of daptomycin is 8/23.  Completed course of Cipro  Chronic hypotension: On midodrine, Florinef  History of coronary artery  disease: Status post PCI in April 24.  On aspirin, Brilinta  Insulin-dependent DM type II: Monitor blood sugars.  Current insulin regimen  History of peripheral artery disease: Aspirin, Brilinta  Normocytic anemia:  Currently hemoglobin stable  Hypokalemia: Supplemented with potassium and corrected  Constipation: Continue bowel regimen     Pressure Injury 04/11/23 Sacrum Mid Stage 2 -  Partial thickness loss of dermis presenting as a shallow open injury with a red, pink wound bed without slough. (Active)  04/11/23 0300  Location: Sacrum  Location Orientation: Mid  Staging: Stage 2 -  Partial thickness loss of dermis presenting as a shallow open injury with a red, pink wound bed without slough.  Wound Description (Comments):   Present on Admission: Yes  Dressing Type Foam - Lift dressing to assess site every shift 04/27/23 0900    DVT prophylaxis:heparin injection 5,000 Units Start: 04/24/23 1445     Code Status: Full Code  Family Communication: Called and discussed with wife on phone on 8/8  Patient status:Inpatient  Patient is from :home  Anticipated discharge to:SNF  Estimated DC date: Whenever possible   Consultants: None  Procedures:None  Antimicrobials:  Anti-infectives (From admission, onward)    Start     Dose/Rate Route Frequency Ordered Stop   04/24/23 2000  ciprofloxacin (CIPRO) tablet 750 mg        750 mg Oral 2 times daily 04/24/23 1346 04/25/23 0752   04/24/23 1445  daptomycin (CUBICIN) IVPB  Status:  Discontinued       Note to Pharmacy: Indication:  MRSA bacteremia First Dose: Yes Last Day of Therapy:  05/10/23   700 mg Intravenous Every 24 hours 04/24/23 1346 04/24/23 1350   04/24/23 1100  DAPTOmycin (CUBICIN) 700 mg in sodium chloride 0.9 % IVPB        700 mg 128 mL/hr over 30 Minutes Intravenous Daily 04/24/23 1023 05/10/23  2359       Subjective: Patient seen and examined bedside today.  Hemodynamically  stable. comfortable.  Lying in bed.  Had a bowel movement yesterday.  No new complaints  Objective: Vitals:   04/26/23 1549 04/26/23 2010 04/27/23 0255 04/27/23 0808  BP: (!) 160/81 119/63 (!) 143/81 (!) 148/92  Pulse: (!) 102 100 82 96  Resp: 18 18  18 16   Temp: 98.1 F (36.7 C) 98.6 F (37 C) (!) 97.5 F (36.4 C) 98.2 F (36.8 C)  TempSrc: Oral Oral Oral Oral  SpO2: 100% 100% 100% 100%  Weight:      Height:        Intake/Output Summary (Last 24 hours) at 04/27/2023 1008 Last data filed at 04/27/2023 0900 Gross per 24 hour  Intake 308.12 ml  Output 1750 ml  Net -1441.88 ml   Filed Weights   04/24/23 1015  Weight: 86.2 kg    Examination:   General exam: Overall comfortable, not in distress HEENT: PERRL Respiratory system:  no wheezes or crackles  Cardiovascular system: S1 & S2 heard, RRR.  Gastrointestinal system: Abdomen is nondistended, soft and nontender. Central nervous system: Alert and oriented Extremities: No edema, no clubbing ,no cyanosis, shallow ulcer  on the left leg, left big toe amputation Skin: No rashes, no ulcers,no icterus    Pressure Injury 04/11/23 Sacrum Mid Stage 2 -  Partial thickness loss of dermis presenting as a shallow open injury with a red, pink wound bed without slough. (Active)  04/11/23 0300  Location: Sacrum  Location Orientation: Mid  Staging: Stage 2 -  Partial thickness loss of dermis presenting as a shallow open injury with a red, pink wound bed without slough.  Wound Description (Comments):   Present on Admission: Yes       Data Reviewed: I have personally reviewed following labs and imaging studies  CBC: Recent Labs  Lab 04/24/23 1033 04/24/23 2315  WBC 9.5 7.9  NEUTROABS 6.7  --   HGB 12.3* 11.3*  HCT 39.1 35.4*  MCV 99.5 94.9  PLT 342 312   Basic Metabolic Panel: Recent Labs  Lab 04/24/23 1033 04/24/23 2315 04/26/23 0801  NA 138 138 137  K 3.8 3.2* 3.6  CL 101 102 101  CO2 22 23 25   GLUCOSE 192* 134* 171*  BUN 19 18 16   CREATININE 0.70 0.85 0.72  CALCIUM 9.2 9.1 9.1  MG  --  2.0  --      No results found for this or any previous visit (from the past 240 hour(s)).   Radiology Studies: No results found.  Scheduled Meds:  aspirin EC  81 mg  Oral Daily   bisacodyl  10 mg Rectal Once   Chlorhexidine Gluconate Cloth  6 each Topical Daily   fludrocortisone  0.2 mg Oral Daily   heparin  5,000 Units Subcutaneous Q8H   insulin aspart  0-5 Units Subcutaneous QHS   insulin aspart  0-9 Units Subcutaneous TID WC   insulin aspart  2 Units Subcutaneous TID WC   insulin glargine-yfgn  10 Units Subcutaneous QHS   midodrine  2.5 mg Oral TID WC   polyethylene glycol  17 g Oral Daily   rosuvastatin  5 mg Oral Once per day on Monday Thursday   senna  1 tablet Oral BID   sodium chloride flush  10-40 mL Intracatheter Q12H   ticagrelor  90 mg Oral BID   Vitamin D (Ergocalciferol)  50,000 Units Oral Q7 days   Continuous  Infusions:  DAPTOmycin (CUBICIN) 700 mg in sodium chloride 0.9 % IVPB Stopped (04/26/23 1413)     LOS: 1 day   Burnadette Pop, MD [facility] P8/06/2023, 10:08 AM

## 2023-04-28 DIAGNOSIS — R531 Weakness: Secondary | ICD-10-CM | POA: Diagnosis not present

## 2023-04-28 LAB — GLUCOSE, CAPILLARY
Glucose-Capillary: 173 mg/dL — ABNORMAL HIGH (ref 70–99)
Glucose-Capillary: 165 mg/dL — ABNORMAL HIGH (ref 70–99)
Glucose-Capillary: 134 mg/dL — ABNORMAL HIGH (ref 70–99)
Glucose-Capillary: 137 mg/dL — ABNORMAL HIGH (ref 70–99)
Glucose-Capillary: 148 mg/dL — ABNORMAL HIGH (ref 70–99)

## 2023-04-28 MED ORDER — BISACODYL 10 MG RE SUPP
10.0000 mg | Freq: Once | RECTAL | Status: AC
  Administered 2023-04-28: 10 mg via RECTAL
  Filled 2023-04-28: qty 1

## 2023-04-28 MED ORDER — POLYETHYLENE GLYCOL 3350 17 G PO PACK
17.0000 g | PACK | Freq: Two times a day (BID) | ORAL | Status: DC
  Administered 2023-04-28 – 2023-05-02 (×9): 17 g via ORAL
  Filled 2023-04-28 (×13): qty 1

## 2023-04-28 NOTE — Progress Notes (Signed)
PROGRESS NOTE  Micheal Moore.  VZC:[id number] DOB: 09/17/1951 DOA: 04/24/2023 PCP: [name], MD   Brief Narrative: Patient is a 72 year old male with history of chronic hypotension, recent bacteremia, coronary artery disease, chronic ambulatory dysfunction,diabetes type 2, peripheral vascular disease who presented with weakness from home.  He was recently admitted here from 7/24 - 8/5 when he was treated for length MRSA, [facility]  bacteremia.  Hospital course also remarkable for aspiration pneumonia.  Patient went home with PICC line for IV antibiotics.  Complain of feeling weak for 2 to 3 days prompting him to return to the hospital.  Patient is currently hemodynamically stable.  Feels much better.PT evaluation done, recommended SNF.  Patient is agreeable for SNF this time.  Medically stable for discharge whenever possible.  Assessment & Plan:  Principal Problem:   Weakness Active Problems:   Hip fracture (HCC)   Aspiration pneumonia (HCC)   CAD (coronary artery disease)   Type 2 diabetes mellitus with hyperlipidemia (HCC)  Generalized weakness/debility/deconditioning: Recently admitted for bacteremia.  On last admission he was recommended SNF but he wanted to go home.  PT/OT consulted, recommended SNF.  Low vitamin D noted, started on supplementation.  Patient has chronic ambulatory dysfunction and hasnot walked for last 9 months.  Polymicrobial bacteremia: He was recently admitted here from 7/24 - 8/5 when he was treated for  MRSA, [facility] for bacteremia.  TEE was negative for vegetation.  PICC line was placed.  The last day of daptomycin is 8/23.  Completed course of Cipro  Chronic hypotension: He was on  midodrine, Florinef at home but is currently hypertensive so these medications have been discontinued.  History of coronary artery  disease: Status post PCI in April 24.  On aspirin, Brilinta  Insulin-dependent DM type II: Monitor blood sugars.  Current insulin  regimen  History of peripheral artery disease: Aspirin, Brilinta  Normocytic anemia: Currently hemoglobin stable  Hypokalemia: Supplemented with potassium and corrected  Constipation: Continue bowel regimen     Pressure Injury 04/11/23 Sacrum Mid Stage 2 -  Partial thickness loss of dermis presenting as a shallow open injury with a red, pink wound bed without slough. (Active)  04/11/23 0300  Location: Sacrum  Location Orientation: Mid  Staging: Stage 2 -  Partial thickness loss of dermis presenting as a shallow open injury with a red, pink wound bed without slough.  Wound Description (Comments):   Present on Admission: Yes  Dressing Type Foam - Lift dressing to assess site every shift 04/28/23 0724    DVT prophylaxis:heparin injection 5,000 Units Start: 04/24/23 1445     Code Status: Full Code  Family Communication: Called and discussed with wife on phone on 8/8  Patient status:Inpatient  Patient is from :home  Anticipated discharge to:SNF  Estimated DC date: Whenever possible   Consultants: None  Procedures:None  Antimicrobials:  Anti-infectives (From admission, onward)    Start     Dose/Rate Route Frequency Ordered Stop   04/24/23 2000  ciprofloxacin (CIPRO) tablet 750 mg        750 mg Oral 2 times daily 04/24/23 1346 04/25/23 0752   04/24/23 1445  daptomycin (CUBICIN) IVPB  Status:  Discontinued       Note to Pharmacy: Indication:  MRSA bacteremia First Dose: Yes Last Day of Therapy:  05/10/23   700 mg Intravenous Every 24 hours 04/24/23 1346 04/24/23 1350   04/24/23 1100  DAPTOmycin (CUBICIN) 700 mg in sodium chloride 0.9 % IVPB  700 mg 128 mL/hr over 30 Minutes Intravenous Daily 04/24/23 1023 05/10/23 2359       Subjective: Patient seen and examined at bedside today.  Hemodynamically stable comfortable.  Lying in bed.  No new complaints.  Last bowel movement was few days ago.  Objective: Vitals:   04/27/23 1728 04/27/23 1928 04/28/23 0421  04/28/23 0723  BP: (!) 152/90 (!) 160/88 (!) 150/92 (!) 161/93  Pulse: 90 93 96 (!) 101  Resp: 20 18 18 18   Temp: 98.2 F (36.8 C) 97.6 F (36.4 C) 97.8 F (36.6 C) 98.1 F (36.7 C)  TempSrc: Oral Oral Oral Oral  SpO2: 100% 100% 100% 99%  Weight:      Height:        Intake/Output Summary (Last 24 hours) at 04/28/2023 1057 Last data filed at 04/28/2023 0724 Gross per 24 hour  Intake 664 ml  Output 2050 ml  Net -1386 ml   Filed Weights   04/24/23 1015  Weight: 86.2 kg    Examination:    General exam: Overall comfortable, not in distress, deconditioned, lying in bed HEENT: PERRL Respiratory system:  no wheezes or crackles  Cardiovascular system: S1 & S2 heard, RRR.  Gastrointestinal system: Abdomen is nondistended, soft and nontender. Central nervous system: Alert and oriented Extremities: No edema, no clubbing ,no cyanosis, shallow ulcer on the left leg, left big toe amputation Skin: No rashes, no ulcers,no icterus    Pressure Injury 04/11/23 Sacrum Mid Stage 2 -  Partial thickness loss of dermis presenting as a shallow open injury with a red, pink wound bed without slough. (Active)  04/11/23 0300  Location: Sacrum  Location Orientation: Mid  Staging: Stage 2 -  Partial thickness loss of dermis presenting as a shallow open injury with a red, pink wound bed without slough.  Wound Description (Comments):   Present on Admission: Yes       Data Reviewed: I have personally reviewed following labs and imaging studies  CBC: Recent Labs  Lab 04/24/23 1033 04/24/23 2315  WBC 9.5 7.9  NEUTROABS 6.7  --   HGB 12.3* 11.3*  HCT 39.1 35.4*  MCV 99.5 94.9  PLT 342 312   Basic Metabolic Panel: Recent Labs  Lab 04/24/23 1033 04/24/23 2315 04/26/23 0801  NA 138 138 137  K 3.8 3.2* 3.6  CL 101 102 101  CO2 22 23 25   GLUCOSE 192* 134* 171*  BUN 19 18 16   CREATININE 0.70 0.85 0.72  CALCIUM 9.2 9.1 9.1  MG  --  2.0  --      No results found for this or any  previous visit (from the past 240 hour(s)).   Radiology Studies: No results found.  Scheduled Meds:  aspirin EC  81 mg Oral Daily   Chlorhexidine Gluconate Cloth  6 each Topical Daily   fludrocortisone  0.2 mg Oral Daily   heparin  5,000 Units Subcutaneous Q8H   insulin aspart  0-5 Units Subcutaneous QHS   insulin aspart  0-9 Units Subcutaneous TID WC   insulin aspart  2 Units Subcutaneous TID WC   insulin glargine-yfgn  10 Units Subcutaneous QHS   polyethylene glycol  17 g Oral BID   rosuvastatin  5 mg Oral Once per day on Monday Thursday   senna  1 tablet Oral BID   sodium chloride flush  10-40 mL Intracatheter Q12H   ticagrelor  90 mg Oral BID   Vitamin D (Ergocalciferol)  50,000 Units Oral Q7 days  Continuous Infusions:  DAPTOmycin (CUBICIN) 700 mg in sodium chloride 0.9 % IVPB Stopped (04/27/23 1513)     LOS: 1 day   Burnadette Pop, MD [facility] P8/07/2023, 10:57 AM

## 2023-04-29 DIAGNOSIS — R531 Weakness: Secondary | ICD-10-CM | POA: Diagnosis not present

## 2023-04-29 LAB — GLUCOSE, CAPILLARY
Glucose-Capillary: 150 mg/dL — ABNORMAL HIGH (ref 70–99)
Glucose-Capillary: 152 mg/dL — ABNORMAL HIGH (ref 70–99)
Glucose-Capillary: 208 mg/dL — ABNORMAL HIGH (ref 70–99)
Glucose-Capillary: 111 mg/dL — ABNORMAL HIGH (ref 70–99)
Glucose-Capillary: 176 mg/dL — ABNORMAL HIGH (ref 70–99)
Glucose-Capillary: 182 mg/dL — ABNORMAL HIGH (ref 70–99)

## 2023-04-29 MED ORDER — BISACODYL 10 MG RE SUPP
10.0000 mg | Freq: Once | RECTAL | Status: AC
  Administered 2023-04-29: 10 mg via RECTAL
  Filled 2023-04-29: qty 1

## 2023-04-29 NOTE — Progress Notes (Signed)
Occupational Therapy Treatment Patient Details Name: Micheal Moore. MRN: [id number] DOB: 21-Aug-1951 Today's Date: 04/29/2023   History of present illness 72 y.o. male re-admitted 8/7 with increased weakness. PMhx: Admission 7/24 - 8/5 with bacteremia secondary to MRSA and [place], complicated by PNA. CAD s/p PCI, syncope, orthostatic hypotension, T2DM, DVT, PAD, right elbow melanoma s/p excision, L THA x2 (due to complications)   OT comments  Pt is making solid progress towards their acute OT goals and was eager to participate today. Overall he demonstrated improvement in sitting balance and bed mobility needing min-max A. He completed grooming in sitting while applying lotion with cues for balance and BUE use. OT to continue to follow acutely to facilitate progress towards established goals and to reduce caregiver burden. Pt will continue to benefit from skilled inpatient follow up therapy, <3 hours/day.       If plan is discharge home, recommend the following:  Two people to help with walking and/or transfers;A lot of help with bathing/dressing/bathroom;Assistance with cooking/housework;Direct supervision/assist for medications management;Direct supervision/assist for financial management;Assist for transportation;Help with stairs or ramp for entrance   Equipment Recommendations  Other (comment)       Precautions / Restrictions Precautions Precautions: Fall Precaution Comments: multiple pressure injuries, back, heels Restrictions Weight Bearing Restrictions: No       Mobility Bed Mobility Overal bed mobility: Needs Assistance Bed Mobility: Rolling, Sidelying to Sit, Sit to Supine Rolling: Min assist Sidelying to sit: Mod assist   Sit to supine: Max assist   General bed mobility comments: Heavy use of bed rails and bed controls, pt admits he is moving better than he did at home    Transfers Overall transfer level: Needs assistance                  General transfer comment: unable and declines hoyer     Balance Overall balance assessment: Needs assistance Sitting-balance support: Single extremity supported, Feet supported Sitting balance-[name]: Poor Sitting balance - Comments: needs at least 1 UE supported. posterior lean                                   ADL either performed or assessed with clinical judgement   ADL Overall ADL's : Needs assistance/impaired     Grooming: Sitting;Bed level;Moderate assistance Grooming Details (indicate cue type and reason): donned lotion in sitting and in supine - pt needed assist for back and BLEs                               General ADL Comments: agreeable to get to EOB this date    Extremity/Trunk Assessment Upper Extremity Assessment Upper Extremity Assessment: Generalized weakness   Lower Extremity Assessment Lower Extremity Assessment: Defer to PT evaluation        Vision       Perception Perception Perception: Not tested   Praxis Praxis Praxis: Not tested    Cognition Arousal: Alert Behavior During Therapy: [facility] for tasks assessed/performed Overall Cognitive Status: No family/caregiver present to determine baseline cognitive functioning                                 General Comments: OVerall WFL for simple tasks, continues to have poor insight and awarenss to deficits and realistic goals  Exercises      Shoulder Instructions       General Comments VSS    Pertinent Vitals/ Pain       Pain Assessment Pain Assessment: Faces Faces Pain Scale: Hurts a little bit Pain Location: bil knees with movement Pain Descriptors / Indicators: Aching Pain Intervention(s): Limited activity within patient's tolerance, Monitored during session  Home Living                                          Prior Functioning/Environment              Frequency  Min 1X/week        Progress Toward  Goals  OT Goals(current goals can now be found in the care plan section)  Progress towards OT goals: Progressing toward goals  Acute Rehab OT Goals Patient Stated Goal: to get better OT Goal Formulation: With patient Time For Goal Achievement: 05/08/23 Potential to Achieve Goals: Good ADL Goals Pt Will Perform Upper Body Dressing: with set-up Additional ADL Goal #1: Pt will complete bed mobility with mod A as a precursor to ADLs Additional ADL Goal #2: Pt will tolerate dependent hoyer lift from bed to chair to promote upright body position  Plan      Co-evaluation                 AM-PAC OT "6 Clicks" Daily Activity     Outcome Measure   Help from another person eating meals?: None Help from another person taking care of personal grooming?: A Little Help from another person toileting, which includes using toliet, bedpan, or urinal?: Total Help from another person bathing (including washing, rinsing, drying)?: A Lot Help from another person to put on and taking off regular upper body clothing?: A Little Help from another person to put on and taking off regular lower body clothing?: Total 6 Click Score: 14    End of Session    OT Visit Diagnosis: Other abnormalities of gait and mobility (R26.89);Muscle weakness (generalized) (M62.81);History of falling (Z91.81);Repeated falls (R29.6)   Activity Tolerance Patient tolerated treatment well   Patient Left in bed;with call bell/phone within reach;with bed alarm set   Nurse Communication Mobility status        Time: 6962-9528 OT Time Calculation (min): 18 min  Charges: OT General Charges $OT Visit: 1 Visit OT Treatments $Therapeutic Activity: 8-22 mins  Derenda Mis, OTR/L Acute Rehabilitation Services Office [phone] Secure Chat Communication Preferred   Donia Pounds 04/29/2023, 5:07 PM

## 2023-04-29 NOTE — Progress Notes (Signed)
PROGRESS NOTE  Micheal Moore.  ION:[id number] DOB: 02-Aug-1951 DOA: 04/24/2023 PCP: [name], MD   Brief Narrative: Patient is a 72 year old male with history of chronic hypotension, recent bacteremia, coronary artery disease, chronic ambulatory dysfunction,diabetes type 2, peripheral vascular disease who presented with weakness from home.  He was recently admitted here from 7/24 - 8/5 when he was treated for length MRSA, [place]  bacteremia.  Hospital course also remarkable for aspiration pneumonia.  Patient went home with PICC line for IV antibiotics.  Complain of feeling weak for 2 to 3 days prompting him to return to the hospital.  Patient is currently hemodynamically stable.  Feels much better.PT evaluation done, recommended SNF.  Patient is agreeable for SNF this time.  Medically stable for discharge whenever possible.  Assessment & Plan:  Principal Problem:   Weakness Active Problems:   Hip fracture (HCC)   Aspiration pneumonia (HCC)   CAD (coronary artery disease)   Type 2 diabetes mellitus with hyperlipidemia (HCC)  Generalized weakness/debility/deconditioning: Recently admitted for bacteremia.  On last admission he was recommended SNF but he wanted to go home.  PT/OT consulted, recommended SNF.  Low vitamin D noted, started on supplementation.  Patient has chronic ambulatory dysfunction and hasnot walked for last 9 months.  Polymicrobial bacteremia: He was recently admitted here from 7/24 - 8/5 when he was treated for  MRSA, [facility] for bacteremia.  TEE was negative for vegetation.  PICC line was placed.  The last day of daptomycin is 8/23.  Completed course of Cipro  Chronic hypotension: He was on  midodrine, Florinef at home but is currently hypertensive so these medications have been discontinued.  History of coronary artery  disease: Status post PCI in April 24.  On aspirin, Brilinta  Insulin-dependent DM type II: Monitor blood sugars.  Current insulin  regimen  History of peripheral artery disease: On Aspirin, Brilinta  Normocytic anemia: Currently hemoglobin stable  Hypokalemia: Supplemented with potassium and corrected  Constipation: Continue bowel regimen     Pressure Injury 04/11/23 Sacrum Mid Stage 2 -  Partial thickness loss of dermis presenting as a shallow open injury with a red, pink wound bed without slough. (Active)  04/11/23 0300  Location: Sacrum  Location Orientation: Mid  Staging: Stage 2 -  Partial thickness loss of dermis presenting as a shallow open injury with a red, pink wound bed without slough.  Wound Description (Comments):   Present on Admission: Yes  Dressing Type Foam - Lift dressing to assess site every shift 04/28/23 0724    DVT prophylaxis:heparin injection 5,000 Units Start: 04/24/23 1445     Code Status: Full Code  Family Communication: Called and discussed with wife on phone on 8/8  Patient status:Inpatient  Patient is from :home  Anticipated discharge to:SNF  Estimated DC date: Whenever possible   Consultants: None  Procedures:None  Antimicrobials:  Anti-infectives (From admission, onward)    Start     Dose/Rate Route Frequency Ordered Stop   04/24/23 2000  ciprofloxacin (CIPRO) tablet 750 mg        750 mg Oral 2 times daily 04/24/23 1346 04/25/23 0752   04/24/23 1445  daptomycin (CUBICIN) IVPB  Status:  Discontinued       Note to Pharmacy: Indication:  MRSA bacteremia First Dose: Yes Last Day of Therapy:  05/10/23   700 mg Intravenous Every 24 hours 04/24/23 1346 04/24/23 1350   04/24/23 1100  DAPTOmycin (CUBICIN) 700 mg in sodium chloride 0.9 % IVPB  700 mg 128 mL/hr over 30 Minutes Intravenous Daily 04/24/23 1023 05/10/23 2359       Subjective: Patient seen and examined at bedside today.  Hemodynamically stable.  Overall comfortable.  Lying in bed.  No new complaints except for not having bowel movements for last few days.  Denies abdomen pain, nausea or  vomiting.  Objective: Vitals:   04/28/23 1638 04/28/23 1926 04/29/23 0309 04/29/23 0730  BP: (!) 159/92 (!) 164/95 (!) 152/101 (!) 144/75  Pulse: 92 88 99 96  Resp: 16 18 16 16   Temp: 98.1 F (36.7 C) 98.3 F (36.8 C) 97.8 F (36.6 C) 98.2 F (36.8 C)  TempSrc: Oral Oral Oral Oral  SpO2: 100% 100% 97% 99%  Weight:      Height:        Intake/Output Summary (Last 24 hours) at 04/29/2023 1027 Last data filed at 04/29/2023 0919 Gross per 24 hour  Intake 840 ml  Output 2300 ml  Net -1460 ml   Filed Weights   04/24/23 1015  Weight: 86.2 kg    Examination:  General exam: Overall comfortable, not in distress, appears deconditioned, lying on bed HEENT: PERRL Respiratory system:  no wheezes or crackles  Cardiovascular system: S1 & S2 heard, RRR.  Gastrointestinal system: Abdomen is nondistended, soft and nontender. Central nervous system: Alert and oriented Extremities: No edema, no clubbing ,no cyanosis, shallow ulcer on the left leg, left big toe amputation Skin: No rashes, no ulcers,no icterus    Pressure Injury 04/11/23 Sacrum Mid Stage 2 -  Partial thickness loss of dermis presenting as a shallow open injury with a red, pink wound bed without slough. (Active)  04/11/23 0300  Location: Sacrum  Location Orientation: Mid  Staging: Stage 2 -  Partial thickness loss of dermis presenting as a shallow open injury with a red, pink wound bed without slough.  Wound Description (Comments):   Present on Admission: Yes       Data Reviewed: I have personally reviewed following labs and imaging studies  CBC: Recent Labs  Lab 04/24/23 1033 04/24/23 2315  WBC 9.5 7.9  NEUTROABS 6.7  --   HGB 12.3* 11.3*  HCT 39.1 35.4*  MCV 99.5 94.9  PLT 342 312   Basic Metabolic Panel: Recent Labs  Lab 04/24/23 1033 04/24/23 2315 04/26/23 0801  NA 138 138 137  K 3.8 3.2* 3.6  CL 101 102 101  CO2 22 23 25   GLUCOSE 192* 134* 171*  BUN 19 18 16   CREATININE 0.70 0.85 0.72   CALCIUM 9.2 9.1 9.1  MG  --  2.0  --      No results found for this or any previous visit (from the past 240 hour(s)).   Radiology Studies: No results found.  Scheduled Meds:  aspirin EC  81 mg Oral Daily   bisacodyl  10 mg Rectal Once   Chlorhexidine Gluconate Cloth  6 each Topical Daily   heparin  5,000 Units Subcutaneous Q8H   insulin aspart  0-5 Units Subcutaneous QHS   insulin aspart  0-9 Units Subcutaneous TID WC   insulin aspart  2 Units Subcutaneous TID WC   insulin glargine-yfgn  10 Units Subcutaneous QHS   polyethylene glycol  17 g Oral BID   rosuvastatin  5 mg Oral Once per day on Monday Thursday   senna  1 tablet Oral BID   sodium chloride flush  10-40 mL Intracatheter Q12H   ticagrelor  90 mg Oral BID   Vitamin D (  Ergocalciferol)  50,000 Units Oral Q7 days   Continuous Infusions:  DAPTOmycin (CUBICIN) 700 mg in sodium chloride 0.9 % IVPB 700 mg (04/28/23 1359)     LOS: 0 days   Burnadette Pop, MD [facility] P8/08/2023, 10:27 AM

## 2023-04-29 NOTE — TOC Progression Note (Signed)
Transition of Care (TOC) - Progression Note    Patient Details  Name: Micheal Moore. MRN: [id number] Date of Birth: November 08, 1950  Transition of Care [facility]) CM/SW Contact   A [place], [place] Phone Number: 04/29/2023, 10:56 AM  Clinical Narrative:     CSW followed up with [facility] regarding authorization.   Auth still pending. Provider notified.   TOC will continue to follow.   Expected Discharge Plan: Skilled Nursing Facility Barriers to Discharge: Continued Medical Work up  Expected Discharge Plan and Services   Discharge Planning Services: CM Consult Post Acute Care Choice: Skilled Nursing Facility Living arrangements for the past 2 months: Single Family Home                                       Social Determinants of Health (SDOH) Interventions SDOH Screenings   Food Insecurity: No Food Insecurity (04/24/2023)  Housing: Low Risk  (04/24/2023)  Transportation Needs: No Transportation Needs (04/24/2023)  Utilities: Not At Risk (04/24/2023)  Tobacco Use: High Risk (04/24/2023)    Readmission Risk Interventions    04/25/2023    1:04 PM 04/12/2023    4:47 PM 07/25/2022    2:42 PM  Readmission Risk Prevention Plan  Transportation Screening Complete Complete   PCP or Specialist Appt within 5-7 Days     PCP or Specialist Appt within 3-5 Days Complete Complete   Home Care Screening     Medication Review (RN CM)     HRI or Home Care Consult Complete Complete   Social Work Consult for Recovery Care Planning/Counseling Complete Complete   Palliative Care Screening Complete Not Applicable   Medication Review Oceanographer) Complete Referral to Pharmacy      Information is confidential and restricted. Go to Review Flowsheets to unlock data.

## 2023-04-30 DIAGNOSIS — R531 Weakness: Secondary | ICD-10-CM | POA: Diagnosis not present

## 2023-04-30 LAB — GLUCOSE, CAPILLARY
Glucose-Capillary: 154 mg/dL — ABNORMAL HIGH (ref 70–99)
Glucose-Capillary: 144 mg/dL — ABNORMAL HIGH (ref 70–99)
Glucose-Capillary: 185 mg/dL — ABNORMAL HIGH (ref 70–99)

## 2023-04-30 MED ORDER — INSULIN ASPART 100 UNIT/ML IJ SOLN
0.0000 [IU] | Freq: Every day | INTRAMUSCULAR | Status: DC
  Administered 2023-05-01 – 2023-05-03 (×2): 2 [IU] via SUBCUTANEOUS

## 2023-04-30 MED ORDER — INSULIN ASPART 100 UNIT/ML IJ SOLN
0.0000 [IU] | Freq: Three times a day (TID) | INTRAMUSCULAR | Status: DC
  Administered 2023-05-01: 2 [IU] via SUBCUTANEOUS
  Administered 2023-05-01: 1 [IU] via SUBCUTANEOUS
  Administered 2023-05-01 – 2023-05-02 (×2): 2 [IU] via SUBCUTANEOUS
  Administered 2023-05-02: 1 [IU] via SUBCUTANEOUS
  Administered 2023-05-02: 2 [IU] via SUBCUTANEOUS
  Administered 2023-05-03: 3 [IU] via SUBCUTANEOUS
  Administered 2023-05-03: 2 [IU] via SUBCUTANEOUS
  Administered 2023-05-03 – 2023-05-04 (×3): 3 [IU] via SUBCUTANEOUS

## 2023-04-30 NOTE — Progress Notes (Signed)
PROGRESS NOTE  Micheal Moore.  UJW:[id number] DOB: 12-12-1950 DOA: 04/24/2023 PCP: [name], MD   Brief Narrative: Patient is a 72 year old male with history of chronic hypotension, recent bacteremia, coronary artery disease, chronic ambulatory dysfunction,diabetes type 2, peripheral vascular disease who presented with weakness from home.  He was recently admitted here from 7/24 - 8/5 when he was treated for length MRSA, [place]  bacteremia.  Hospital course also remarkable for aspiration pneumonia.  Patient went home with PICC line for IV antibiotics.  Complain of feeling weak for 2 to 3 days prompting him to return to the hospital.  Patient is currently hemodynamically stable.  Feels much better.PT evaluation done, recommended SNF.  Patient is agreeable for SNF this time.  Medically stable for discharge whenever possible.  Assessment & Plan:  Principal Problem:   Weakness Active Problems:   Hip fracture (HCC)   Aspiration pneumonia (HCC)   CAD (coronary artery disease)   Type 2 diabetes mellitus with hyperlipidemia (HCC)  Generalized weakness/debility/deconditioning: Recently admitted for bacteremia.  On last admission he was recommended SNF but he wanted to go home.  PT/OT consulted, recommended SNF.  Low vitamin D noted, started on supplementation.  Patient has chronic ambulatory dysfunction and hasnot walked for last 9 months.  Polymicrobial bacteremia: He was recently admitted here from 7/24 - 8/5 when he was treated for  MRSA, [facility] for bacteremia.  TEE was negative for vegetation.  PICC line was placed.  The last day of daptomycin is 8/23.  Completed course of Cipro  Chronic hypotension: He was on  midodrine, Florinef at home but is currently hypertensive so these medications have been discontinued.  History of coronary artery  disease: Status post PCI in April 24.  On aspirin, Brilinta  Insulin-dependent DM type II: Monitor blood sugars.  Current insulin  regimen  History of peripheral artery disease: On Aspirin, Brilinta  Normocytic anemia: Currently hemoglobin stable  Hypokalemia: Supplemented with potassium and corrected  Constipation: Continue bowel regimen     Pressure Injury 04/11/23 Sacrum Mid Stage 2 -  Partial thickness loss of dermis presenting as a shallow open injury with a red, pink wound bed without slough. (Active)  04/11/23 0300  Location: Sacrum  Location Orientation: Mid  Staging: Stage 2 -  Partial thickness loss of dermis presenting as a shallow open injury with a red, pink wound bed without slough.  Wound Description (Comments):   Present on Admission: Yes  Dressing Type Foam - Lift dressing to assess site every shift 04/30/23 0815    DVT prophylaxis:heparin injection 5,000 Units Start: 04/24/23 1445     Code Status: Full Code  Family Communication: Called and discussed with wife on phone on 8/8  Patient status:Inpatient  Patient is from :home  Anticipated discharge to:SNF  Estimated DC date: Whenever possible   Consultants: None  Procedures:None  Antimicrobials:  Anti-infectives (From admission, onward)    Start     Dose/Rate Route Frequency Ordered Stop   04/24/23 2000  ciprofloxacin (CIPRO) tablet 750 mg        750 mg Oral 2 times daily 04/24/23 1346 04/25/23 0752   04/24/23 1445  daptomycin (CUBICIN) IVPB  Status:  Discontinued       Note to Pharmacy: Indication:  MRSA bacteremia First Dose: Yes Last Day of Therapy:  05/10/23   700 mg Intravenous Every 24 hours 04/24/23 1346 04/24/23 1350   04/24/23 1100  DAPTOmycin (CUBICIN) 700 mg in sodium chloride 0.9 % IVPB  700 mg 128 mL/hr over 30 Minutes Intravenous Daily 04/24/23 1023 05/10/23 2359       Subjective: Patient seen and examined the bedside today.  Hemodynamically stable. comfortable, lying in bed.  No new complaints   Objective: Vitals:   04/29/23 1608 04/29/23 1957 04/30/23 0516 04/30/23 0851  BP: (!) 168/88 137/89  (!) 143/81 (!) 151/83  Pulse: 94 97 93 96  Resp: 16 16 17 17   Temp: 98.2 F (36.8 C) 98 F (36.7 C) 97.7 F (36.5 C) 98.7 F (37.1 C)  TempSrc: Oral     SpO2: 98% 99% 99% 100%  Weight:      Height:        Intake/Output Summary (Last 24 hours) at 04/30/2023 1332 Last data filed at 04/30/2023 1320 Gross per 24 hour  Intake 628 ml  Output 1975 ml  Net -1347 ml   Filed Weights   04/24/23 1015  Weight: 86.2 kg    Examination:  General exam: Overall comfortable, not in distress, lying in bed, deconditioned HEENT: PERRL Respiratory system:  no wheezes or crackles  Cardiovascular system: S1 & S2 heard, RRR.  Gastrointestinal system: Abdomen is nondistended, soft and nontender. Central nervous system: Alert and oriented Extremities: No edema, no clubbing ,no cyanosis, sacral ulcer on the left leg, left great toe amputation Skin: No rashes,no icterus    Pressure Injury 04/11/23 Sacrum Mid Stage 2 -  Partial thickness loss of dermis presenting as a shallow open injury with a red, pink wound bed without slough. (Active)  04/11/23 0300  Location: Sacrum  Location Orientation: Mid  Staging: Stage 2 -  Partial thickness loss of dermis presenting as a shallow open injury with a red, pink wound bed without slough.  Wound Description (Comments):   Present on Admission: Yes       Data Reviewed: I have personally reviewed following labs and imaging studies  CBC: Recent Labs  Lab 04/24/23 1033 04/24/23 2315  WBC 9.5 7.9  NEUTROABS 6.7  --   HGB 12.3* 11.3*  HCT 39.1 35.4*  MCV 99.5 94.9  PLT 342 312   Basic Metabolic Panel: Recent Labs  Lab 04/24/23 1033 04/24/23 2315 04/26/23 0801  NA 138 138 137  K 3.8 3.2* 3.6  CL 101 102 101  CO2 22 23 25   GLUCOSE 192* 134* 171*  BUN 19 18 16   CREATININE 0.70 0.85 0.72  CALCIUM 9.2 9.1 9.1  MG  --  2.0  --      No results found for this or any previous visit (from the past 240 hour(s)).   Radiology Studies: No results  found.  Scheduled Meds:  aspirin EC  81 mg Oral Daily   Chlorhexidine Gluconate Cloth  6 each Topical Daily   heparin  5,000 Units Subcutaneous Q8H   insulin aspart  0-5 Units Subcutaneous QHS   insulin aspart  0-9 Units Subcutaneous TID WC   insulin aspart  2 Units Subcutaneous TID WC   insulin glargine-yfgn  10 Units Subcutaneous QHS   polyethylene glycol  17 g Oral BID   rosuvastatin  5 mg Oral Once per day on Monday Thursday   senna  1 tablet Oral BID   sodium chloride flush  10-40 mL Intracatheter Q12H   ticagrelor  90 mg Oral BID   Vitamin D (Ergocalciferol)  50,000 Units Oral Q7 days   Continuous Infusions:  DAPTOmycin (CUBICIN) 700 mg in sodium chloride 0.9 % IVPB 700 mg (04/30/23 1319)     LOS:  0 days   Burnadette Pop, MD [facility] P8/13/2024, 1:32 PM

## 2023-04-30 NOTE — TOC Progression Note (Signed)
Transition of Care (TOC) - Progression Note    Patient Details  Name: Alexs Bifano. MRN: [id number] Date of Birth: 02-Feb-1951  [facility] CM/SW Contact   A [place], [place] Phone Number: 04/30/2023, 10:21 AM  Clinical Narrative:     CSW was informed by [facility] that pt's insurance is still under medical review for authorization. Facility will update CSW once Berkley Harvey is finalized. Provider notified.   TOC will continue to follow.   Expected Discharge Plan: Skilled Nursing Facility Barriers to Discharge: Continued Medical Work up  Expected Discharge Plan and Services   Discharge Planning Services: CM Consult Post Acute Care Choice: Skilled Nursing Facility Living arrangements for the past 2 months: Single Family Home                                       Social Determinants of Health (SDOH) Interventions SDOH Screenings   Food Insecurity: No Food Insecurity (04/24/2023)  Housing: Low Risk  (04/24/2023)  Transportation Needs: No Transportation Needs (04/24/2023)  Utilities: Not At Risk (04/24/2023)  Tobacco Use: High Risk (04/24/2023)    Readmission Risk Interventions    04/25/2023    1:04 PM 04/12/2023    4:47 PM 07/25/2022    2:42 PM  Readmission Risk Prevention Plan  Transportation Screening Complete Complete   PCP or Specialist Appt within 5-7 Days     PCP or Specialist Appt within 3-5 Days Complete Complete   Home Care Screening     Medication Review (RN CM)     HRI or Home Care Consult Complete Complete   Social Work Consult for Recovery Care Planning/Counseling Complete Complete   Palliative Care Screening Complete Not Applicable   Medication Review Oceanographer) Complete Referral to Pharmacy      Information is confidential and restricted. Go to Review Flowsheets to unlock data.

## 2023-05-01 DIAGNOSIS — E1169 Type 2 diabetes mellitus with other specified complication: Secondary | ICD-10-CM

## 2023-05-01 DIAGNOSIS — E785 Hyperlipidemia, unspecified: Secondary | ICD-10-CM

## 2023-05-01 DIAGNOSIS — R531 Weakness: Secondary | ICD-10-CM | POA: Diagnosis not present

## 2023-05-01 DIAGNOSIS — J690 Pneumonitis due to inhalation of food and vomit: Secondary | ICD-10-CM

## 2023-05-01 DIAGNOSIS — R262 Difficulty in walking, not elsewhere classified: Secondary | ICD-10-CM | POA: Diagnosis not present

## 2023-05-01 LAB — GLUCOSE, CAPILLARY
Glucose-Capillary: 163 mg/dL — ABNORMAL HIGH (ref 70–99)
Glucose-Capillary: 145 mg/dL — ABNORMAL HIGH (ref 70–99)
Glucose-Capillary: 154 mg/dL — ABNORMAL HIGH (ref 70–99)
Glucose-Capillary: 203 mg/dL — ABNORMAL HIGH (ref 70–99)

## 2023-05-01 NOTE — Progress Notes (Addendum)
PROGRESS NOTE  [name].  WUJ:[id number] DOB: 06/11/1951 DOA: 04/24/2023 PCP: [name], MD   Brief Narrative: Patient is a 72 year old male with history of chronic hypotension, recent bacteremia, coronary artery disease, chronic ambulatory dysfunction,diabetes type 2, peripheral vascular disease who presented with weakness from home.  He was recently admitted here from 7/24 - 8/5 when he was treated for length MRSA, [facility]  bacteremia.  Hospital course also remarkable for aspiration pneumonia.  Patient went home with PICC line for IV antibiotics.  Complain of feeling weak for 2 to 3 days prompting him to return to the hospital.  Patient is currently hemodynamically stable.  Feels much better.PT evaluation done, recommended SNF.  Patient is agreeable for SNF this time.  Medically stable for discharge whenever possible.  8/14: Insurance authorization still pending.  Remained medically stable.  Assessment & Plan:  Principal Problem:   Weakness Active Problems:   Hip fracture (HCC)   Aspiration pneumonia (HCC)   CAD (coronary artery disease)   Type 2 diabetes mellitus with hyperlipidemia (HCC)  Generalized weakness/debility/deconditioning: Recently admitted for bacteremia.  On last admission he was recommended SNF but he wanted to go home.  PT/OT consulted, recommended SNF.  Low vitamin D noted, started on supplementation.  Patient has chronic ambulatory dysfunction and hasnot walked for last 9 months.  Polymicrobial bacteremia: He was recently admitted here from 7/24 - 8/5 when he was treated for  MRSA, [facility] for bacteremia.  TEE was negative for vegetation.  PICC line was placed.  The last day of daptomycin is 8/23.  Completed course of Cipro  Chronic hypotension: He was on  midodrine, Florinef at home but is currently hypertensive so these medications have been discontinued.  History of coronary artery  disease: Status post PCI in April 24.  On aspirin,  Brilinta  Insulin-dependent DM type II: Monitor blood sugars.  Current insulin regimen  History of peripheral artery disease: On Aspirin, Brilinta  Normocytic anemia: Currently hemoglobin stable  Hypokalemia: Supplemented with potassium and corrected  Constipation: Continue bowel regimen     Pressure Injury 04/11/23 Sacrum Mid Stage 2 -  Partial thickness loss of dermis presenting as a shallow open injury with a red, pink wound bed without slough. (Active)  04/11/23 0300  Location: Sacrum  Location Orientation: Mid  Staging: Stage 2 -  Partial thickness loss of dermis presenting as a shallow open injury with a red, pink wound bed without slough.  Wound Description (Comments):   Present on Admission: Yes  Dressing Type Foam - Lift dressing to assess site every shift 05/01/23 0800    DVT prophylaxis:heparin injection 5,000 Units Start: 04/24/23 1445     Code Status: Full Code  Family Communication:  Patient status:Inpatient  Patient is from :home  Anticipated discharge to:SNF  Estimated DC date: Whenever possible   Consultants: None  Procedures:None  Antimicrobials:  Anti-infectives (From admission, onward)    Start     Dose/Rate Route Frequency Ordered Stop   04/24/23 2000  ciprofloxacin (CIPRO) tablet 750 mg        750 mg Oral 2 times daily 04/24/23 1346 04/25/23 0752   04/24/23 1445  daptomycin (CUBICIN) IVPB  Status:  Discontinued       Note to Pharmacy: Indication:  MRSA bacteremia First Dose: Yes Last Day of Therapy:  05/10/23   700 mg Intravenous Every 24 hours 04/24/23 1346 04/24/23 1350   04/24/23 1100  DAPTOmycin (CUBICIN) 700 mg in sodium chloride 0.9 % IVPB  700 mg 128 mL/hr over 30 Minutes Intravenous Daily 04/24/23 1023 05/10/23 2359       Subjective: Patient was eating breakfast when seen today.  No new concern.  Objective: Vitals:   04/30/23 1807 04/30/23 2028 05/01/23 0448 05/01/23 0717  BP: 132/76 126/76 (!) 145/93 (!) 156/86   Pulse: 90 90 95 95  Resp: 18 18 18 16   Temp:  98.3 F (36.8 C) 97.9 F (36.6 C) 97.8 F (36.6 C)  TempSrc:  Oral Oral Oral  SpO2: 99% 100% 99% 100%  Weight:      Height:        Intake/Output Summary (Last 24 hours) at 05/01/2023 1440 Last data filed at 05/01/2023 1014 Gross per 24 hour  Intake 304 ml  Output 2100 ml  Net -1796 ml   Filed Weights   04/24/23 1015  Weight: 86.2 kg    Examination:  General.  Frail elderly man, in no acute distress. Pulmonary.  Lungs clear bilaterally, normal respiratory effort. CV.  Regular rate and rhythm, no JVD, rub or murmur. Abdomen.  Soft, nontender, nondistended, BS positive. CNS.  Alert and oriented .  No focal neurologic deficit. Extremities.  No edema, no cyanosis, pulses intact and symmetrical.  Left big toe amputated Psychiatry.  Judgment and insight appears normal.    Pressure Injury 04/11/23 Sacrum Mid Stage 2 -  Partial thickness loss of dermis presenting as a shallow open injury with a red, pink wound bed without slough. (Active)  04/11/23 0300  Location: Sacrum  Location Orientation: Mid  Staging: Stage 2 -  Partial thickness loss of dermis presenting as a shallow open injury with a red, pink wound bed without slough.  Wound Description (Comments):   Present on Admission: Yes    Data Reviewed: I have personally reviewed following labs and imaging studies  CBC: Recent Labs  Lab 04/24/23 2315 05/01/23 0355  WBC 7.9 5.9  HGB 11.3* 11.7*  HCT 35.4* 36.5*  MCV 94.9 97.6  PLT 312 255   Basic Metabolic Panel: Recent Labs  Lab 04/24/23 2315 04/26/23 0801 05/01/23 0355  NA 138 137 137  K 3.2* 3.6 4.6  CL 102 101 104  CO2 23 25 26   GLUCOSE 134* 171* 191*  BUN 18 16 21   CREATININE 0.85 0.72 0.64  CALCIUM 9.1 9.1 9.2  MG 2.0  --   --      No results found for this or any previous visit (from the past 240 hour(s)).   Radiology Studies: No results found.  Scheduled Meds:  aspirin EC  81 mg Oral Daily    Chlorhexidine Gluconate Cloth  6 each Topical Daily   heparin  5,000 Units Subcutaneous Q8H   insulin aspart  0-5 Units Subcutaneous QHS   insulin aspart  0-9 Units Subcutaneous TID WC   insulin aspart  2 Units Subcutaneous TID WC   insulin glargine-yfgn  10 Units Subcutaneous QHS   polyethylene glycol  17 g Oral BID   rosuvastatin  5 mg Oral Once per day on Monday Thursday   senna  1 tablet Oral BID   sodium chloride flush  10-40 mL Intracatheter Q12H   ticagrelor  90 mg Oral BID   Vitamin D (Ergocalciferol)  50,000 Units Oral Q7 days   Continuous Infusions:  DAPTOmycin (CUBICIN) 700 mg in sodium chloride 0.9 % IVPB Stopped (04/30/23 1349)     LOS: 0 days   Arnetha Courser, MD [facility] P8/14/2024, 2:40 PM

## 2023-05-01 NOTE — Plan of Care (Signed)
  Problem: Education: Goal: Knowledge of General Education information will improve Description Including pain rating scale, medication(s)/side effects and non-pharmacologic comfort measures Outcome: Progressing   

## 2023-05-01 NOTE — TOC Progression Note (Signed)
Transition of Care (TOC) - Progression Note    Patient Details  Name: Micheal Moore. MRN: [id number] Date of Birth: 25-Jun-1951  Transition of Care [facility]) CM/SW Contact   A [place], [place] Phone Number: 05/01/2023, 10:48 AM  Clinical Narrative:     CSW was informed by [facility] that pt's Berkley Harvey is still pending under medical review.   TOC will continue to follow.   Expected Discharge Plan: Skilled Nursing Facility Barriers to Discharge: Continued Medical Work up  Expected Discharge Plan and Services   Discharge Planning Services: CM Consult Post Acute Care Choice: Skilled Nursing Facility Living arrangements for the past 2 months: Single Family Home                                       Social Determinants of Health (SDOH) Interventions SDOH Screenings   Food Insecurity: No Food Insecurity (04/24/2023)  Housing: Low Risk  (04/24/2023)  Transportation Needs: No Transportation Needs (04/24/2023)  Utilities: Not At Risk (04/24/2023)  Tobacco Use: High Risk (04/24/2023)    Readmission Risk Interventions    04/25/2023    1:04 PM 04/12/2023    4:47 PM 07/25/2022    2:42 PM  Readmission Risk Prevention Plan  Transportation Screening Complete Complete   PCP or Specialist Appt within 5-7 Days     PCP or Specialist Appt within 3-5 Days Complete Complete   Home Care Screening     Medication Review (RN CM)     HRI or Home Care Consult Complete Complete   Social Work Consult for Recovery Care Planning/Counseling Complete Complete   Palliative Care Screening Complete Not Applicable   Medication Review Oceanographer) Complete Referral to Pharmacy      Information is confidential and restricted. Go to Review Flowsheets to unlock data.

## 2023-05-01 NOTE — Plan of Care (Signed)

## 2023-05-02 DIAGNOSIS — J690 Pneumonitis due to inhalation of food and vomit: Secondary | ICD-10-CM | POA: Diagnosis not present

## 2023-05-02 DIAGNOSIS — R531 Weakness: Secondary | ICD-10-CM | POA: Diagnosis not present

## 2023-05-02 DIAGNOSIS — R262 Difficulty in walking, not elsewhere classified: Secondary | ICD-10-CM | POA: Diagnosis not present

## 2023-05-02 DIAGNOSIS — E1169 Type 2 diabetes mellitus with other specified complication: Secondary | ICD-10-CM | POA: Diagnosis not present

## 2023-05-02 LAB — GLUCOSE, CAPILLARY
Glucose-Capillary: 138 mg/dL — ABNORMAL HIGH (ref 70–99)
Glucose-Capillary: 168 mg/dL — ABNORMAL HIGH (ref 70–99)
Glucose-Capillary: 168 mg/dL — ABNORMAL HIGH (ref 70–99)
Glucose-Capillary: 190 mg/dL — ABNORMAL HIGH (ref 70–99)
Glucose-Capillary: 194 mg/dL — ABNORMAL HIGH (ref 70–99)
Glucose-Capillary: 195 mg/dL — ABNORMAL HIGH (ref 70–99)

## 2023-05-02 MED ORDER — INSULIN ASPART 100 UNIT/ML IJ SOLN
3.0000 [IU] | Freq: Three times a day (TID) | INTRAMUSCULAR | Status: DC
  Administered 2023-05-02 – 2023-05-04 (×7): 3 [IU] via SUBCUTANEOUS

## 2023-05-02 NOTE — Plan of Care (Signed)

## 2023-05-02 NOTE — TOC Progression Note (Signed)
Transition of Care (TOC) - Progression Note    Patient Details  Name: Micheal Moore. MRN: [id number] Date of Birth: 11-Oct-1950  Transition of Care [facility]) CM/SW Contact   A [place], [place] Phone Number: 05/02/2023, 1:10 PM  Clinical Narrative:     Insurance offered P2P for pt. Call direct line, [phone]. Deadline to complete P2P is Friday 8/16 at 9:35am. Auth ID: [id number].   Provider notified.   TOC will continue to follow.   Expected Discharge Plan: Skilled Nursing Facility Barriers to Discharge: Continued Medical Work up  Expected Discharge Plan and Services   Discharge Planning Services: CM Consult Post Acute Care Choice: Skilled Nursing Facility Living arrangements for the past 2 months: Single Family Home                                       Social Determinants of Health (SDOH) Interventions SDOH Screenings   Food Insecurity: No Food Insecurity (04/24/2023)  Housing: Low Risk  (04/24/2023)  Transportation Needs: No Transportation Needs (04/24/2023)  Utilities: Not At Risk (04/24/2023)  Tobacco Use: High Risk (04/24/2023)    Readmission Risk Interventions    04/25/2023    1:04 PM 04/12/2023    4:47 PM 07/25/2022    2:42 PM  Readmission Risk Prevention Plan  Transportation Screening Complete Complete   PCP or Specialist Appt within 5-7 Days     PCP or Specialist Appt within 3-5 Days Complete Complete   Home Care Screening     Medication Review (RN CM)     HRI or Home Care Consult Complete Complete   Social Work Consult for Recovery Care Planning/Counseling Complete Complete   Palliative Care Screening Complete Not Applicable   Medication Review Oceanographer) Complete Referral to Pharmacy      Information is confidential and restricted. Go to Review Flowsheets to unlock data.

## 2023-05-02 NOTE — Progress Notes (Signed)
PROGRESS NOTE  Micheal Moore.  GNF:[id number] DOB: 18-Mar-1951 DOA: 04/24/2023 PCP: [name], MD   Brief Narrative: Patient is a 72 year old male with history of chronic hypotension, recent bacteremia, coronary artery disease, chronic ambulatory dysfunction,diabetes type 2, peripheral vascular disease who presented with weakness from home.  He was recently admitted here from 7/24 - 8/5 when he was treated for length MRSA, [facility]  bacteremia.  Hospital course also remarkable for aspiration pneumonia.  Patient went home with PICC line for IV antibiotics.  Complain of feeling weak for 2 to 3 days prompting him to return to the hospital.  Patient is currently hemodynamically stable.  Feels much better.PT evaluation done, recommended SNF.  Patient is agreeable for SNF this time.  Medically stable for discharge whenever possible.  8/14: Insurance authorization still pending.  Remained medically stable.  8/15: Insurance is asking for peer to peer review, initiated and waiting response.  Assessment & Plan:  Principal Problem:   Weakness Active Problems:   Hip fracture (HCC)   Aspiration pneumonia (HCC)   CAD (coronary artery disease)   Type 2 diabetes mellitus with hyperlipidemia (HCC)   Ambulatory dysfunction  Generalized weakness/debility/deconditioning: Recently admitted for bacteremia.  On last admission he was recommended SNF but he wanted to go home.  PT/OT consulted, recommended SNF.  Low vitamin D noted, started on supplementation.  Patient has chronic ambulatory dysfunction and hasnot walked for last 9 months.  Polymicrobial bacteremia: He was recently admitted here from 7/24 - 8/5 when he was treated for  MRSA, [facility] for bacteremia.  TEE was negative for vegetation.  PICC line was placed.  The last day of daptomycin is 8/23.  Completed course of Cipro  Chronic hypotension: He was on  midodrine, Florinef at home but is currently hypertensive so these medications  have been discontinued.  History of coronary artery  disease: Status post PCI in April 24.  On aspirin, Brilinta  Insulin-dependent DM type II: Monitor blood sugars.  Current insulin regimen  History of peripheral artery disease: On Aspirin, Brilinta  Normocytic anemia: Currently hemoglobin stable  Hypokalemia: Supplemented with potassium and corrected  Constipation: Continue bowel regimen     Pressure Injury 04/11/23 Sacrum Mid Stage 2 -  Partial thickness loss of dermis presenting as a shallow open injury with a red, pink wound bed without slough. (Active)  04/11/23 0300  Location: Sacrum  Location Orientation: Mid  Staging: Stage 2 -  Partial thickness loss of dermis presenting as a shallow open injury with a red, pink wound bed without slough.  Wound Description (Comments):   Present on Admission: Yes  Dressing Type Foam - Lift dressing to assess site every shift 05/02/23 0800    DVT prophylaxis:heparin injection 5,000 Units Start: 04/24/23 1445     Code Status: Full Code  Family Communication: Called and discussed with wife on phone on 8/8  Patient status:Inpatient  Patient is from :home  Anticipated discharge to:SNF  Estimated DC date: Whenever possible   Consultants: None  Procedures:None  Antimicrobials:  Anti-infectives (From admission, onward)    Start     Dose/Rate Route Frequency Ordered Stop   04/24/23 2000  ciprofloxacin (CIPRO) tablet 750 mg        750 mg Oral 2 times daily 04/24/23 1346 04/25/23 0752   04/24/23 1445  daptomycin (CUBICIN) IVPB  Status:  Discontinued       Note to Pharmacy: Indication:  MRSA bacteremia First Dose: Yes Last Day of Therapy:  05/10/23  700 mg Intravenous Every 24 hours 04/24/23 1346 04/24/23 1350   04/24/23 1100  DAPTOmycin (CUBICIN) 700 mg in sodium chloride 0.9 % IVPB        700 mg 128 mL/hr over 30 Minutes Intravenous Daily 04/24/23 1023 05/10/23 2359       Subjective: Patient was resting comfortably when  seen today.  No new concern.  Objective: Vitals:   05/01/23 1556 05/01/23 1942 05/02/23 0324 05/02/23 0727  BP: (!) 147/88 119/76 125/66 (!) 142/89  Pulse: 99 96 78 98  Resp: 16 18 18 16   Temp: 98.1 F (36.7 C) 98 F (36.7 C) 97.7 F (36.5 C) 97.8 F (36.6 C)  TempSrc: Oral   Oral  SpO2: 99% 98% 99% 100%  Weight:      Height:        Intake/Output Summary (Last 24 hours) at 05/02/2023 1418 Last data filed at 05/02/2023 1038 Gross per 24 hour  Intake 237 ml  Output 1750 ml  Net -1513 ml   Filed Weights   04/24/23 1015  Weight: 86.2 kg    Examination:  General.  Frail elderly man, in no acute distress. Pulmonary.  Lungs clear bilaterally, normal respiratory effort. CV.  Regular rate and rhythm, no JVD, rub or murmur. Abdomen.  Soft, nontender, nondistended, BS positive. CNS.  Alert and oriented .  No focal neurologic deficit. Extremities.  No edema, no cyanosis, pulses intact and symmetrical.  Amputated left big toe Psychiatry.  Judgment and insight appears normal.     Pressure Injury 04/11/23 Sacrum Mid Stage 2 -  Partial thickness loss of dermis presenting as a shallow open injury with a red, pink wound bed without slough. (Active)  04/11/23 0300  Location: Sacrum  Location Orientation: Mid  Staging: Stage 2 -  Partial thickness loss of dermis presenting as a shallow open injury with a red, pink wound bed without slough.  Wound Description (Comments):   Present on Admission: Yes    Data Reviewed: I have personally reviewed following labs and imaging studies  CBC: Recent Labs  Lab 05/01/23 0355  WBC 5.9  HGB 11.7*  HCT 36.5*  MCV 97.6  PLT 255   Basic Metabolic Panel: Recent Labs  Lab 04/26/23 0801 05/01/23 0355  NA 137 137  K 3.6 4.6  CL 101 104  CO2 25 26  GLUCOSE 171* 191*  BUN 16 21  CREATININE 0.72 0.64  CALCIUM 9.1 9.2     No results found for this or any previous visit (from the past 240 hour(s)).   Radiology Studies: No results  found.  Scheduled Meds:  aspirin EC  81 mg Oral Daily   Chlorhexidine Gluconate Cloth  6 each Topical Daily   heparin  5,000 Units Subcutaneous Q8H   insulin aspart  0-5 Units Subcutaneous QHS   insulin aspart  0-9 Units Subcutaneous TID WC   insulin aspart  3 Units Subcutaneous TID WC   insulin glargine-yfgn  10 Units Subcutaneous QHS   polyethylene glycol  17 g Oral BID   rosuvastatin  5 mg Oral Once per day on Monday Thursday   senna  1 tablet Oral BID   sodium chloride flush  10-40 mL Intracatheter Q12H   ticagrelor  90 mg Oral BID   Vitamin D (Ergocalciferol)  50,000 Units Oral Q7 days   Continuous Infusions:  DAPTOmycin (CUBICIN) 700 mg in sodium chloride 0.9 % IVPB 700 mg (05/01/23 1457)     LOS: 0 days   Arnetha Courser, MD  [facility] P8/15/2024, 2:18 PM

## 2023-05-03 ENCOUNTER — Inpatient Hospital Stay: Payer: Medicare HMO | Admitting: Family

## 2023-05-03 DIAGNOSIS — E1169 Type 2 diabetes mellitus with other specified complication: Secondary | ICD-10-CM | POA: Diagnosis not present

## 2023-05-03 DIAGNOSIS — R262 Difficulty in walking, not elsewhere classified: Secondary | ICD-10-CM | POA: Diagnosis not present

## 2023-05-03 DIAGNOSIS — R531 Weakness: Secondary | ICD-10-CM | POA: Diagnosis not present

## 2023-05-03 DIAGNOSIS — R7881 Bacteremia: Secondary | ICD-10-CM

## 2023-05-03 LAB — CK: Total CK: 43 U/L — ABNORMAL LOW (ref 49–397)

## 2023-05-03 LAB — GLUCOSE, CAPILLARY
Glucose-Capillary: 171 mg/dL — ABNORMAL HIGH (ref 70–99)
Glucose-Capillary: 222 mg/dL — ABNORMAL HIGH (ref 70–99)
Glucose-Capillary: 236 mg/dL — ABNORMAL HIGH (ref 70–99)
Glucose-Capillary: 193 mg/dL — ABNORMAL HIGH (ref 70–99)
Glucose-Capillary: 222 mg/dL — ABNORMAL HIGH (ref 70–99)

## 2023-05-03 MED ORDER — DAPTOMYCIN IV (FOR PTA / DISCHARGE USE ONLY): 700.0000 mg | INTRAVENOUS | 0 refills | Status: AC

## 2023-05-03 MED ORDER — BARRIER CREAM NON-SPECIFIED: 1.0000 | TOPICAL_CREAM | Freq: Two times a day (BID) | TOPICAL | Status: DC | PRN

## 2023-05-03 NOTE — Progress Notes (Signed)
  Progress Note   Patient: Micheal Moore. ZOX:[id number] DOB: 02/08/1951 DOA: 04/24/2023     0 DOS: the patient was seen and examined on 05/03/2023   Brief hospital course: 72 year old man PMH chronic ambulatory dysfunction, essentially bedbound, recent admission for polymicrobial bacteremia discharged on IV antibiotics, who presented back to the hospital with report of weakness.  Was placed on IV antibiotics and initially SNF was pursued.  Authorization was delayed, and then denied by [employer] as well as peer to peer. Plan is to return home 8/17.  Consultants None  Procedures None  Assessment and Plan: Generalized weakness/debility/deconditioning  Chronic ambulatory dysfunction Recently admitted for bacteremia.  On last admission he was recommended SNF but he wanted to go home.  PT/OT consulted, recommended SNF.   Insurance denied SNF.  Plan for discharge home tomorrow.     Polymicrobial bacteremia Admitted here from 7/24 - 8/5 for  MRSA, [facility] for bacteremia.  TEE was negative for vegetation.  PICC line was placed.  The last day of daptomycin is 8/23.  Completed course of Cipro   Chronic hypotension  He was on  midodrine, Florinef at home but is currently hypertensive so these medications have been discontinued.   Coronary artery  disease  Status post PCI in April 24.  On aspirin, Brilinta   Insulin-dependent DM type II  CBG stable.  Continue Semglee and meal coverage.   PAD  On Aspirin, Brilinta   Normocytic anemia  Currently hemoglobin stable   Hypokalemia -- resolved   Constipation Last BM 8/16    Subjective:  Feels ok  Physical Exam: Vitals:   05/02/23 1618 05/02/23 1933 05/03/23 0414 05/03/23 0730  BP: (!) 148/81 106/65 (!) 164/89 (!) 139/93  Pulse: 86 88 99 (!) 106  Resp: 16 18 18    Temp: 98.1 F (36.7 C) 98.3 F (36.8 C) (!) 97.5 F (36.4 C) 97.7 F (36.5 C)  TempSrc: Oral Axillary Oral Oral  SpO2: 100% 98% 99%   Weight:       Height:       Physical Exam Vitals reviewed.  Constitutional:      General: He is not in acute distress.    Appearance: He is not ill-appearing or toxic-appearing.  Cardiovascular:     Rate and Rhythm: Normal rate and regular rhythm.     Heart sounds: No murmur heard. Pulmonary:     Effort: Pulmonary effort is normal. No respiratory distress.     Breath sounds: No wheezing, rhonchi or rales.  Skin:    Comments: Moisture injury sacrum, moisture rash on back  Neurological:     Mental Status: He is alert.  Psychiatric:        Behavior: Behavior normal.     Data Reviewed: CBG stable  Family Communication: none  Disposition: Status is: Observation      Time spent: 35 minutes  Author: Brendia Sacks, MD 05/03/2023 3:22 PM  For on call review [email or web address].

## 2023-05-03 NOTE — Progress Notes (Signed)
PHARMACY CONSULT NOTE FOR:  OUTPATIENT  PARENTERAL ANTIBIOTIC THERAPY (OPAT)  Indication: MRSA bacteremia Regimen: Daptomycin 700 mg IV every 24 hours End date: 05/11/23   IV antibiotic discharge orders are pended. To discharging provider:  please sign these orders via discharge navigator,  Select New Orders & click on the button choice - Manage This Unsigned Work.     Thank you for allowing pharmacy to be a part of this patient's care.  Sharin Mons, PharmD, BCPS, BCIDP Infectious Diseases Clinical Pharmacist Phone: [phone] 05/03/2023 1:02 PM   **Pharmacist phone directory can now be found on [email or web address] ([email or web address]).  Listed under [facility].

## 2023-05-03 NOTE — Hospital Course (Addendum)
72 year old man PMH chronic ambulatory dysfunction, essentially bedbound, recent admission for polymicrobial bacteremia discharged on IV antibiotics, who presented back to the hospital with report of weakness.  Was placed on IV antibiotics and initially SNF was pursued.  Authorization was delayed, and then denied by [employer] as well as peer to peer. Plan is to return home 8/17.  Consultants None  Procedures None

## 2023-05-03 NOTE — TOC Progression Note (Addendum)
Transition of Care (TOC) - Progression Note    Patient Details  Name: Micheal Moore. MRN: [id number] Date of Birth: 09-19-1950  Transition of Care [facility]) CM/SW Contact  Micheal Bridgeman, RN Phone Number: 05/03/2023, 11:55 AM  Clinical Narrative:    CM spoke with Dr. Nelson Moore this morning and [name] has denied skilled nursing placement for the patient.  I spoke with the patient at the bedside and he states that his wife is unable to provide the IV antibiotics in the home and he does not have a financial resources to pay for SNF/LTC out of pocket.  The patient states that he was agreeable to Medicaid screening.  I sent a secure email to Micheal Moore, CM with financial counseling and asked that patient be screened for Medicaid.  The patient state that they do not have financial resources, do not have a computer and was agreeable to start the process.  The patient states that his wife works at the [employer] 21 hours a week to assist with bills.  The patient states that he does have family to assist other than his wife and states that he does not speak to his neighbors and does not have any other family/friends to give the IV antibiotics.  Attending MD was notified of the barriers to discharge.  I called and left a message with Micheal Moore line and left a detailed message with nursing case management at the insurance provider to assist for other options - otherwise patient will remain inpatient to receive IV antibiotics through 05/11/23 til the end of IV antibiotics.  05/03/2023 1233 - I called and spoke with the patient's wife, Micheal Moore by phone and she was updated that patient was declined for SNF placement by the insurance company provider.  The patient's wife was in agreement for patient to return home tomorrow morning by [facility].  The patient's wife works this afternoon and evening and will not be home today to provide care.  Patient was updated and in agreement.  I  called and spoke with Micheal Moore, RNCM with Micheal Moore and she will reach out to the wife today to re-teach IV antibiotics and coordinate IV antibiotics to the home. Patient's wife was in agreement to allow [facility] RN in the home for PICC line care.  HH order for RN will be placed.  MD was updated regarding patient's return t home tomorrow by [facility] and need for new OPAT orders for home.  I spoke with wife and she states that patient has Medicaid - I called Admitting and confirmed that patient has Medicaid  - his Medicaid is community medicaid that does not cover the cost of SNF/LTC placement.  05/03/23 1557 - Micheal Moore, RNCM with [facility]heal Moore states that she will reach out to the wife today to refresh her with IV infusion/IV push teaching.  The patient's wife demonstrated knowledge and ability to provide the IV infusions at home without any trouble.  Micheal Moore plans to send the medications to the home - He will be ready to discharge tomorrow - just call the wife tomorrow and schedule [facility] transport.  Medical necessity is all filled out and address confirmed.  RNCM will follow up in the am for transport to the home via [facility].   Expected Discharge Plan: Skilled Nursing Facility Barriers to Discharge: Continued Medical Work up  Expected Discharge Plan and Services   Discharge Planning Services: CM Consult Post Acute Care Choice: Skilled Nursing Facility Living arrangements for the past 2  months: Single Family Home                                       Social Determinants of Health (SDOH) Interventions SDOH Screenings   Food Insecurity: No Food Insecurity (04/24/2023)  Housing: Low Risk  (04/24/2023)  Transportation Needs: No Transportation Needs (04/24/2023)  Utilities: Not At Risk (04/24/2023)  Tobacco Use: High Risk (04/24/2023)    Readmission Risk Interventions    04/25/2023    1:04 PM 04/12/2023    4:47 PM 07/25/2022    2:42 PM  Readmission Risk Prevention Plan  Transportation  Screening Complete Complete   PCP or Specialist Appt within 5-7 Days     PCP or Specialist Appt within 3-5 Days Complete Complete   Home Care Screening     Medication Review (RN CM)     HRI or Home Care Consult Complete Complete   Social Work Consult for Recovery Care Planning/Counseling Complete Complete   Palliative Care Screening Complete Not Applicable   Medication Review Oceanographer) Complete Referral to Pharmacy      Information is confidential and restricted. Go to Review Flowsheets to unlock data.

## 2023-05-04 DIAGNOSIS — R531 Weakness: Secondary | ICD-10-CM | POA: Diagnosis not present

## 2023-05-04 DIAGNOSIS — R7881 Bacteremia: Secondary | ICD-10-CM | POA: Diagnosis not present

## 2023-05-04 DIAGNOSIS — R262 Difficulty in walking, not elsewhere classified: Secondary | ICD-10-CM | POA: Diagnosis not present

## 2023-05-04 LAB — GLUCOSE, CAPILLARY
Glucose-Capillary: 236 mg/dL — ABNORMAL HIGH (ref 70–99)
Glucose-Capillary: 216 mg/dL — ABNORMAL HIGH (ref 70–99)
Glucose-Capillary: 188 mg/dL — ABNORMAL HIGH (ref 70–99)

## 2023-05-04 MED ORDER — BARRIER CREAM NON-SPECIFIED: 1.0000 | TOPICAL_CREAM | Freq: Two times a day (BID) | TOPICAL | Status: DC | PRN

## 2023-05-04 MED ORDER — HEPARIN SOD (PORK) LOCK FLUSH 100 UNIT/ML IV SOLN
250.0000 [IU] | INTRAVENOUS | Status: AC | PRN
  Administered 2023-05-04: 250 [IU]

## 2023-05-04 NOTE — Plan of Care (Signed)

## 2023-05-04 NOTE — Discharge Summary (Signed)
Physician Discharge Summary   Patient: Micheal Moore. MRN: [id number] DOB: 12-01-1950  Admit date:     04/24/2023  Discharge date: 05/04/23  Discharge Physician: Brendia Sacks   PCP: [name], MD   Recommendations at discharge:    Resolution of bacteremia.  Already has outpatient follow-up set up with infectious disease. Chronic hypotension.  A bit hypertensive here so midodrine discontinued.  Consider stopping Florinef as appropriate as an outpatient.  Discharge Diagnoses: Principal Problem:   Weakness Active Problems:   Hip fracture (HCC)   Aspiration pneumonia (HCC)   CAD (coronary artery disease)   Type 2 diabetes mellitus with hyperlipidemia (HCC)   Ambulatory dysfunction  Resolved Problems:   * No resolved hospital problems. *  Hospital Course: 72 year old man PMH chronic ambulatory dysfunction, essentially bedbound, recent admission for polymicrobial bacteremia discharged on IV antibiotics, who presented back to the hospital with report of weakness.  Was placed on IV antibiotics and initially SNF was pursued.  Authorization was delayed, and then denied by [employer] as well as peer to peer.  Plan discharge home today on IV antibiotics.  Consultants None  Procedures None  Generalized weakness/debility/deconditioning  Chronic ambulatory dysfunction Recently admitted for bacteremia.  On last admission he was recommended SNF but he wanted to go home.  PT/OT consulted, recommended SNF.   Insurance denied SNF.  Home today.   Polymicrobial bacteremia Admitted here from 7/24 - 8/5 for  MRSA, [place] for bacteremia.  TEE was negative for vegetation.  PICC line was placed.  The last day of daptomycin is 8/23.  Completed course of Cipro   Chronic hypotension  He was on midodrine, Florinef at home but is currently hypertensive so midodrine has been stopped.  Consider discontinuing Florinef if appropriate as an outpatient.   Coronary artery   disease  Status post PCI in April 24.  On aspirin, Brilinta   Insulin-dependent DM type II  CBG stable.  Resume home regimen on discharge.   PAD  On Aspirin, Brilinta   Normocytic anemia  Currently hemoglobin stable   Hypokalemia -- resolved   Constipation Last BM 8/16      Disposition: Home health Diet recommendation:  Regular diet DISCHARGE MEDICATION: Allergies as of 05/04/2023   No Known Allergies      Medication List     STOP taking these medications    ciprofloxacin 750 MG tablet Commonly known as: CIPRO   midodrine 5 MG tablet Commonly known as: PROAMATINE       TAKE these medications    acetaminophen 325 MG tablet Commonly known as: TYLENOL Take 2 tablets (650 mg total) by mouth every 6 (six) hours as needed for mild pain, fever or headache.   aspirin EC 81 MG tablet Take 1 tablet (81 mg total) by mouth daily. Swallow whole.   barrier cream Crea Commonly known as: non-specified Apply 1 Application topically 2 (two) times daily as needed.   Blood Glucose Monitoring Suppl Devi 1 each by Does not apply route 3 (three) times daily. May dispense any manufacturer covered by patient's insurance.   BLOOD GLUCOSE TEST STRIPS Strp 1 each by Does not apply route 3 (three) times daily. Use as directed to check blood sugar. May dispense any manufacturer covered by patient's insurance and fits patient's device.   Brilinta 90 MG Tabs tablet Generic drug: ticagrelor Take 90 mg by mouth 2 (two) times daily.   daptomycin IVPB Commonly known as: CUBICIN Inject 700 mg into the vein daily  for 8 days. Indication:  MRSA bacteremia First Dose: Yes Last Day of Therapy:  05/11/23 Labs - Once weekly:  CBC/D, BMP, and CPK Labs - Once weekly: ESR and CRP Method of administration: IV Push Method of administration may be changed at the discretion of home infusion pharmacist based upon assessment of the patient and/or caregiver's ability to self-administer the  medication ordered. What changed: additional instructions   Ensure Max Protein Liqd Take 330 mLs (11 oz total) by mouth 2 (two) times daily. What changed: when to take this   fludrocortisone 0.1 MG tablet Commonly known as: FLORINEF Take 2 tablets (0.2 mg total) by mouth daily.   Glucagon Emergency 1 MG Kit Inject 1 mg into the skin as needed for up to 2 doses (Severe low blood sugar).   insulin aspart 100 UNIT/ML FlexPen Commonly known as: NOVOLOG Inject 8 Units into the skin 3 (three) times daily with meals. Only take if eating a meal AND Blood Glucose (BG) is 80 or higher.   insulin detemir 100 UNIT/ML FlexPen Commonly known as: LEVEMIR Inject 20 Units into the skin at bedtime. DO NOT TAKE if your blood sugars and consistently running < 120   Jardiance 10 MG Tabs tablet Generic drug: empagliflozin Take 10 mg by mouth daily.   Lancet Device Misc 1 each by Does not apply route 3 (three) times daily. May dispense any manufacturer covered by patient's insurance.   Lancets Misc 1 each by Does not apply route 3 (three) times daily. Use as directed to check blood sugar. May dispense any manufacturer covered by patient's insurance and fits patient's device.   Pen Needles 31G X 5 MM Misc 1 each by Does not apply route 3 (three) times daily. May dispense any manufacturer covered by patient's insurance.   polyethylene glycol 17 g packet Commonly known as: MIRALAX / GLYCOLAX Take 17 g by mouth daily. What changed:  when to take this reasons to take this   rosuvastatin 5 MG tablet Commonly known as: CRESTOR Take 5 mg by mouth 2 (two) times a week.               Home Infusion Instuctions  (From admission, onward)           Start     Ordered   05/03/23 0000  Home infusion instructions       Question:  Instructions  Answer:  Flushing of vascular access device: 0.9% NaCl pre/post medication administration and prn patency; Heparin 100 u/ml, 5ml for implanted ports and  Heparin 10u/ml, 5ml for all other central venous catheters.   05/03/23 1304              Discharge Care Instructions  (From admission, onward)           Start     Ordered   05/04/23 0000  Discharge wound care:       Comments: Barrier cream to moisture injury on sacrum   05/04/23 1232            Follow-up Information     [facility] Follow up.   Why: [facility] will provide IV antibiotics for the home.               Discharge Exam: Filed Weights   04/24/23 1015  Weight: 86.2 kg   Physical Exam Vitals reviewed.  Constitutional:      General: He is not in acute distress.    Appearance: He is not ill-appearing or toxic-appearing.  Cardiovascular:  Rate and Rhythm: Normal rate and regular rhythm.     Heart sounds: No murmur heard. Pulmonary:     Effort: Pulmonary effort is normal. No respiratory distress.     Breath sounds: No wheezing, rhonchi or rales.  Neurological:     Mental Status: He is alert.  Psychiatric:        Mood and Affect: Mood normal.        Behavior: Behavior normal.   ,   Condition at discharge: good  The results of significant diagnostics from this hospitalization (including imaging, microbiology, ancillary and laboratory) are listed below for reference.   Imaging Studies: [place] EKG SITE RITE  Result Date: 04/19/2023 If Site Rite image not attached, placement could not be confirmed due to current cardiac rhythm.  ECHO TEE  Result Date: 04/19/2023    TRANSESOPHOGEAL ECHO REPORT   Patient Name:   Micheal Moore. Date of Exam: 04/19/2023 Medical Rec #:  [id number]                 Height:       77.0 in Accession #:    [id number]                Weight:       188.3 lb Date of Birth:  [birth year]                 BSA:          2.180 m Patient Age:    72 years                  BP:           73/44 mmHg Patient Gender: M                         HR:           79 bpm. Exam Location:  Inpatient Procedure: 3D Echo, Transesophageal Echo,  Cardiac Doppler and Color Doppler Indications:     Bacteremia  History:         Patient has prior history of Echocardiogram examinations, most                  recent 04/12/2023. Previous Myocardial Infarction and CAD,                  Signs/Symptoms:Bacteremia and Hypotension; Risk Factors:Current                  Smoker and Diabetes.  Sonographer:     Sheralyn Boatman RDCS Referring Phys:  [id number] Cyndi Bender Diagnosing Phys: Epifanio Lesches MD PROCEDURE: After discussion of the risks and benefits of a TEE, an informed consent was obtained from the patient. The transesophogeal probe was passed without difficulty through the esophogus of the patient. Imaged were obtained with the patient in a left lateral decubitus position. Sedation performed by different physician. The patient was monitored while under deep sedation. Anesthestetic sedation was provided intravenously by Anesthesiology: 166mg  of Propofol, 100mg  of Lidocaine. The patient developed Hypotension during the procedure.  IMPRESSIONS  1. Left ventricular ejection fraction, by estimation, is 55 to 60%. The left ventricle has normal function. The left ventricle has no regional wall motion abnormalities. There is moderate left ventricular hypertrophy.  2. Right ventricular systolic function is normal. The right ventricular size is normal.  3. No left atrial/left atrial appendage thrombus was detected.  4. The mitral valve is degenerative. Trivial mitral  valve regurgitation.  5. The aortic valve is tricuspid. Aortic valve regurgitation is not visualized. No aortic stenosis is present. Conclusion(s)/Recommendation(s): No evidence of vegetation/infective endocarditis on this transesophageael echocardiogram. FINDINGS  Left Ventricle: Left ventricular ejection fraction, by estimation, is 55 to 60%. The left ventricle has normal function. The left ventricle has no regional wall motion abnormalities. The left ventricular internal cavity size was normal in size. There is   moderate left ventricular hypertrophy. Right Ventricle: The right ventricular size is normal. No increase in right ventricular wall thickness. Right ventricular systolic function is normal. Left Atrium: Left atrial size was normal in size. No left atrial/left atrial appendage thrombus was detected. Right Atrium: Right atrial size was normal in size. Pericardium: There is no evidence of pericardial effusion. Mitral Valve: The mitral valve is degenerative in appearance. Trivial mitral valve regurgitation. Tricuspid Valve: The tricuspid valve is grossly normal. Tricuspid valve regurgitation is not demonstrated. Aortic Valve: The aortic valve is tricuspid. Aortic valve regurgitation is not visualized. No aortic stenosis is present. Aortic valve mean gradient measures 2.0 mmHg. Aortic valve peak gradient measures 3.5 mmHg. Pulmonic Valve: The pulmonic valve was normal in structure. Pulmonic valve regurgitation is trivial. No evidence of pulmonic stenosis. Aorta: The aortic root is normal in size and structure. IAS/Shunts: No atrial level shunt detected by color flow Doppler. Additional Comments: Spectral Doppler performed. AORTIC VALVE AV Vmax:      94.00 cm/s AV Vmean:     67.100 cm/s AV VTI:       0.188 m AV Peak Grad: 3.5 mmHg AV Mean Grad: 2.0 mmHg  AORTA Ao Asc diam: 3.60 cm Epifanio Lesches MD Electronically signed by Epifanio Lesches MD Signature Date/Time: 04/19/2023/2:28:51 PM    Final    EP STUDY  Result Date: 04/19/2023 See surgical note for result.  DG Foot Complete Left  Result Date: 04/12/2023 CLINICAL DATA:  Osteomyelitis.  Pain EXAM: LEFT FOOT - COMPLETE 3 VIEW COMPARISON:  None Available. FINDINGS: Moderate well corticated plantar and Achilles calcaneal spurs. Degenerative changes seen about the dorsal aspect of the midfoot. Degenerative changes also seen of the tibiotalar joint. There is severe osteopenia. Scattered soft tissue swelling. 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 No clear areas of bony erosive changes at this time. If there is further concern of bone infection, MRI or bone scan may be useful for further sensitivity. IMPRESSION: Osteopenia with soft tissue swelling and degenerative changes. Calcaneal spurs. 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   On: 04/12/2023 15:47   DG Swallowing Func-Speech Pathology  Result Date: 04/12/2023 Table formatting from the original result was not included. Modified Barium Swallow Study Patient Details Name: Noal Kratzer. MRN: [id number] Date of Birth: 09-30-1950 Today's Date: 04/12/2023 HPI/PMH: HPI: Pt is a 72 yo male presenting to ED 7/25 presenting with nausea, vomiting, and fever. Per MD note, pt's wife reports pt does not ambulate and is bedbound. She also reports that he lays flat in bed to eat/drink. CXR revealed central peribronchial wall thickening with some interstitial and patchy opacities in both lower lobes. PMH includes cancer, T2DM, dizziness with frequent falls, DVT, syncope Clinical Impression: Clinical Impression: Pt presents with an overall functional oropharyngeal swallow. He effectively achieved complete laryngeal vestibule closure during the swallow. Pharyngeal residue present throughout trials of thicker consistencies along base of tongue, valleculae, and pyriform sinuses. He effectively manages this residue and it is  cleared with an intermittent cough, which he initiates independently. No penetration or aspiration noted throughout all trials, although may be at greater risk if deconditioned or if he does not maintain a fully upright position while eating and drinking. The pill was administered with straw sips of thin liquids with prompt and complete clearance during the esophageal sweep. Recommend continuing diet of regular textures with thin liquids only when pt is positioned fully upright. Given recent emesis, recommend staying fully  upright ~30 minutes after eating and drinking. SLP will f/u x1 to provide education regarding aspiration precautions. Factors that may increase risk of adverse event in presence of aspiration Rubye Oaks & Clearance Coots 2021): Factors that may increase risk of adverse event in presence of aspiration Rubye Oaks & Clearance Coots 2021): Limited mobility; Frail or deconditioned Recommendations/Plan: Swallowing Evaluation Recommendations Swallowing Evaluation Recommendations Recommendations: PO diet PO Diet Recommendation: Regular; Thin liquids (Level 0) Liquid Administration via: Cup; Straw Medication Administration: Whole meds with liquid Supervision: Patient able to self-feed Swallowing strategies  : Minimize environmental distractions; Slow rate Postural changes: Position pt fully upright for meals; Stay upright 30-60 min after meals Oral care recommendations: Oral care BID (2x/day) Treatment Plan Treatment Plan Treatment recommendations: Therapy as outlined in treatment plan below Follow-up recommendations: No SLP follow up Functional status assessment: Patient has not had a recent decline in their functional status. Treatment frequency: Min 1x/week Treatment duration: 1 week Interventions: Aspiration precaution training; Patient/family education; Diet toleration management by SLP Recommendations Recommendations for follow up therapy are one component of a multi-disciplinary discharge planning process, led by the attending physician.  Recommendations may be updated based on patient status, additional functional criteria and insurance authorization. Assessment: Orofacial Exam: Orofacial Exam Oral Cavity: Oral Hygiene: WFL Oral Cavity - Dentition: Poor condition; Missing dentition Orofacial Anatomy: WFL Oral Motor/Sensory Function: WFL Anatomy: Anatomy: Suspected cervical osteophytes Boluses Administered: Boluses Administered Boluses Administered: Thin liquids (Level 0); Mildly thick liquids (Level 2, nectar thick); Moderately thick  liquids (Level 3, honey thick); Puree; Solid  Oral Impairment Domain: Oral Impairment Domain Lip Closure: No labial escape Tongue control during bolus hold: Cohesive bolus between tongue to palatal seal Bolus preparation/mastication: Timely and efficient chewing and mashing Bolus transport/lingual motion: Brisk tongue motion Oral residue: Complete oral clearance Location of oral residue : N/A Initiation of pharyngeal swallow : Valleculae  Pharyngeal Impairment Domain: Pharyngeal Impairment Domain Soft palate elevation: No bolus between soft palate (SP)/pharyngeal wall (PW) Laryngeal elevation: Complete superior movement of thyroid cartilage with complete approximation of arytenoids to epiglottic petiole Anterior hyoid excursion: Complete anterior movement Epiglottic movement: Complete inversion Laryngeal vestibule closure: Complete, no air/contrast in laryngeal vestibule Pharyngeal stripping wave : Present - complete Pharyngeal contraction (A/P view only): N/A Pharyngoesophageal segment opening: Complete distension and complete duration, no obstruction of flow Tongue base retraction: Trace column of contrast or air between tongue base and PPW Pharyngeal residue: Collection of residue within or on pharyngeal structures Location of pharyngeal residue: Valleculae; Tongue base; Pyriform sinuses  Esophageal Impairment Domain: Esophageal Impairment Domain Esophageal clearance upright position: Complete clearance, esophageal coating Pill: Pill Consistency administered: Thin liquids (Level 0) Thin liquids (Level 0): [facility] Penetration/Aspiration Scale Score: Penetration/Aspiration Scale Score 1.  Material does not enter airway: Thin liquids (Level 0); Mildly thick liquids (Level 2, nectar thick); Moderately thick liquids (Level 3, honey thick); Puree; Solid; Pill Compensatory Strategies: Compensatory Strategies Compensatory strategies: Yes Straw: Effective Effective Straw: Thin liquid (Level 0)   General Information: Caregiver  present: No  Diet Prior to this Study:  Regular; Thin liquids (Level 0)   Temperature : Normal   Respiratory Status: WFL   Supplemental O2: None (Room air)   History of Recent Intubation: No  Behavior/Cognition: Alert; Cooperative; Pleasant mood Self-Feeding Abilities: Able to self-feed Baseline vocal quality/speech: Normal Volitional Cough: Able to elicit Volitional Swallow: Able to elicit Exam Limitations: No limitations Goal Planning: Prognosis for improved oropharyngeal function: Good Barriers to Reach Goals: Time post onset No data recorded Patient/Family Stated Goal: none stated Consulted and agree with results and recommendations: Patient Pain: Pain Assessment Pain Assessment: Faces Faces Pain Scale: 6 Pain Location: feet/toes when bumped/touched Pain Descriptors / Indicators: Guarding; Grimacing; Discomfort Pain Intervention(s): Monitored during session; Repositioned End of Session: Start Time:SLP Start Time (ACUTE ONLY): 1352 Stop Time: SLP Stop Time (ACUTE ONLY): 1411 Time Calculation:SLP Time Calculation (min) (ACUTE ONLY): 19 min Charges: SLP Evaluations $ SLP Speech Visit: 1 Visit SLP Evaluations $BSS Swallow: 1 Procedure $MBS Swallow: 1 Procedure SLP visit diagnosis: SLP Visit Diagnosis: Dysphagia, pharyngeal phase (R13.13) Past Medical History: Past Medical History: Diagnosis Date  Cancer (HCC)   right elbow melanoma  Diabetes mellitus without complication (HCC)   Type II  Dizziness   DVT (deep venous thrombosis) (HCC)   right leg  Frequent falls   Near syncope   Syncope  Past Surgical History: Past Surgical History: Procedure Laterality Date  AMPUTATION TOE Left 04/28/2020  Procedure: AMPUTATION LEFT GREAT TOE;  Surgeon: Nadara Mustard, MD;  Location: [facility] OR;  Service: Orthopedics;  Laterality: Left;  CORONARY/GRAFT ACUTE MI REVASCULARIZATION N/A 01/15/2023  Procedure: Coronary/Graft Acute MI Revascularization;  Surgeon: Orbie Pyo, MD;  Location: [facility] IN[facility]ASIVE [facility];  Service: Cardiovascular;   Laterality: N/A;  INTRAMEDULLARY (IM) NAIL INTERTROCHANTERIC Right 02/23/2022  Procedure: INTRAMEDULLARY (IM) NAIL INTERTROCHANTRIC, RIGHT;  Surgeon: Samson Frederic, MD;  Location: [facility]S;  Service: Orthopedics;  Laterality: Right;  LEFT HEART CATH AND CORONARY ANGIOGRAPHY N/A 01/15/2023  Procedure: LEFT HEART CATH AND CORONARY ANGIOGRAPHY;  Surgeon: Orbie Pyo, MD;  Location: [facility];  Service: Cardiovascular;  Laterality: N/A;  LOWER EXTREMITY ANGIOGRAPHY Bilateral 01/18/2023  Procedure: Lower Extremity Angiography;  Surgeon: Nada Libman, MD;  Location: [facility];  Service: Cardiovascular;  Laterality: Bilateral;  MELANOMA EXCISION WITH SENTINEL LYMPH NODE BIOPSY Right 08/19/2020  Procedure: WIDE LOCAL EXCISION RIGHT ELBOW MELANOMA WITH RIGHT AXILLARY SENTINEL LYMPH NODE BIOPSY;  Surgeon: Fritzi Mandes, MD;  Location: [facility] OR;  Service: General;  Laterality: Right;  2nd incision in axilla  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  Procedure: TOTAL HIP ARTHROPLASTY ANTERIOR APPROACH;  Surgeon: Samson Frederic, MD;  Location: [facility];  Service: Orthopedics;  Laterality: Left;  TOTAL HIP ARTHROPLASTY Left 08/24/2022  Procedure: OPEN REDUCTION, HEAD AND LINER EXCHANGE;  Surgeon: Samson Frederic, MD;  Location: [facility];  Service: Orthopedics;  Laterality: Left; Gwynneth Aliment, M.A., CF-SLP Speech Language Pathology, Acute Rehabilitation Services Secure Chat preferred [phone] 04/12/2023, 2:57 PM   ECHOCARDIOGRAM LIMITED  Result Date: 04/12/2023    ECHOCARDIOGRAM LIMITED REPORT   Patient Name:   Micheal Moore. Date of Exam: 04/12/2023 Medical Rec #:  [id number]                 Height:       77.0 in Accession #:    [id number]                Weight:       192.5 lb Date of Birth:  1950-11-12  BSA:          2.201 m Patient Age:    72 years                  BP:           129/74 mmHg Patient Gender: M                         HR:           76 bpm. Exam Location:  Inpatient  Procedure: Limited Echo and Color Doppler Indications:    Bacteremia  History:        Patient has prior history of Echocardiogram examinations, most                 recent 01/16/2023. CAD and Previous Myocardial Infarction; Risk                 Factors:Dyslipidemia and Diabetes.  Sonographer:    Milbert Coulter Referring Phys: Belia Heman COMER IMPRESSIONS  1. No obvious vegetations, but valves not seen well enough to rule out. Would recommended TEE if clinically indicated.  2. Left ventricular ejection fraction, by estimation, is 65 to 70%. The left ventricle has normal function.  3. Right ventricular systolic function is normal. The right ventricular size is normal.  4. Trivial mitral valve regurgitation.  5. Aortic valve regurgitation is not visualized.  6. The inferior vena cava is normal in size with greater than 50% respiratory variability, suggesting right atrial pressure of 3 mmHg. FINDINGS  Left Ventricle: Left ventricular ejection fraction, by estimation, is 65 to 70%. The left ventricle has normal function. The left ventricular internal cavity size was normal in size. There is no left ventricular hypertrophy. Right Ventricle: The right ventricular size is normal. Right vetricular wall thickness was not assessed. Right ventricular systolic function is normal. Left Atrium: Left atrial size was normal in size. Right Atrium: Right atrial size was normal in size. Pericardium: There is no evidence of pericardial effusion. Mitral Valve: There is mild thickening of the mitral valve leaflet(s). Trivial mitral valve regurgitation. Tricuspid Valve: The tricuspid valve is normal in structure. Tricuspid valve regurgitation is not demonstrated. Aortic Valve: Aortic valve regurgitation is not visualized. Pulmonic Valve: The pulmonic valve was not well visualized. Pulmonic valve regurgitation is not visualized. Aorta: The aortic root is normal in size and structure. Venous: The inferior vena cava is normal in size with greater  than 50% respiratory variability, suggesting right atrial pressure of 3 mmHg. IAS/Shunts: No atrial level shunt detected by color flow Doppler. Additional Comments: Color Doppler performed.  Dietrich Pates MD Electronically signed by Dietrich Pates MD Signature Date/Time: 04/12/2023/2:49:29 PM    Final    DG Chest Port 1 View  Result Date: 04/10/2023 CLINICAL DATA:  Cough and fever EXAM: PORTABLE CHEST 1 VIEW COMPARISON:  Chest x-ray 10/01/2022 FINDINGS: There is central peribronchial wall thickening with some interstitial and patchy opacities in both lower lungs. There is no focal lung consolidation, pleural effusion or pneumothorax. Cardiomediastinal silhouette is within normal limits. IMPRESSION: Central peribronchial wall thickening with some interstitial and patchy opacities in both lower lungs. Findings are nonspecific and could be seen in the setting of atypical/viral infection. Electronically Signed   By: Darliss Cheney M.D.   On: 04/10/2023 23:23    Microbiology: Results for orders placed or performed during the hospital encounter of 04/10/23  Blood culture (routine x 2)     Status: Abnormal  Collection Time: 04/10/23 10:55 PM   Specimen: BLOOD  Result Value Ref Range Status   Specimen Description BLOOD SITE NOT SPECIFIED  Final   Special Requests   Final    BOTTLES DRAWN AEROBIC AND ANAEROBIC Blood Culture results may not be optimal due to an excessive volume of blood received in culture bottles   Culture  Setup Time   Final    GRAM NEGATIVE RODS GRAM POSITIVE COCCI IN CLUSTERS IN BOTH AEROBIC AND ANAEROBIC BOTTLES CRITICAL RESULT CALLED TO, READ BACK BY AND VERIFIED WITHNorva Riffle PHARMD, AT 1458 04/11/23 Renato Shin Performed at [facility], [address], [place], [place] [address]    Culture (A)  Final    METHICILLIN RESISTANT STAPHYLOCOCCUS AUREUS PROVIDENCIA RETTGERI    Report Status 04/13/2023 FINAL  Final   Organism ID, Bacteria METHICILLIN RESISTANT STAPHYLOCOCCUS AUREUS   Final   Organism ID, Bacteria PROVIDENCIA RETTGERI  Final      Susceptibility   Methicillin resistant staphylococcus aureus - MIC*    CIPROFLOXACIN >=8 RESISTANT Resistant     ERYTHROMYCIN >=8 RESISTANT Resistant     GENTAMICIN <=0.5 SENSITIVE Sensitive     OXACILLIN >=4 RESISTANT Resistant     TETRACYCLINE <=1 SENSITIVE Sensitive     VANCOMYCIN <=0.5 SENSITIVE Sensitive     TRIMETH/SULFA <=10 SENSITIVE Sensitive     CLINDAMYCIN RESISTANT Resistant     RIFAMPIN <=0.5 SENSITIVE Sensitive     Inducible Clindamycin POSITIVE Resistant     LINEZOLID 2 SENSITIVE Sensitive     * METHICILLIN RESISTANT STAPHYLOCOCCUS AUREUS   Providencia rettgeri - MIC*    AMPICILLIN RESISTANT Resistant     CEFEPIME <=0.12 SENSITIVE Sensitive     CEFTAZIDIME <=1 SENSITIVE Sensitive     CEFTRIAXONE <=0.25 SENSITIVE Sensitive     CIPROFLOXACIN <=0.25 SENSITIVE Sensitive     GENTAMICIN <=1 SENSITIVE Sensitive     IMIPENEM 1 SENSITIVE Sensitive     TRIMETH/SULFA <=20 SENSITIVE Sensitive     AMPICILLIN/SULBACTAM <=2 SENSITIVE Sensitive     PIP/TAZO <=4 SENSITIVE Sensitive     * PROVIDENCIA RETTGERI  Blood Culture ID Panel (Reflexed)     Status: Abnormal   Collection Time: 04/10/23 10:55 PM  Result Value Ref Range Status   Enterococcus faecalis NOT DETECTED NOT DETECTED Final   Enterococcus Faecium NOT DETECTED NOT DETECTED Final   Listeria monocytogenes NOT DETECTED NOT DETECTED Final   Staphylococcus species DETECTED (A) NOT DETECTED Final    Comment: CRITICAL RESULT CALLED TO, READ BACK BY AND VERIFIED WITH: E. SINCLAIR PHARMD, AT 1458 04/11/23 D. VANHOOK    Staphylococcus aureus (BCID) DETECTED (A) NOT DETECTED Final    Comment: Methicillin (oxacillin)-resistant Staphylococcus aureus (MRSA). MRSA is predictably resistant to beta-lactam antibiotics (except ceftaroline). Preferred therapy is vancomycin unless clinically contraindicated. Patient requires contact precautions if  hospitalized. CRITICAL  RESULT CALLED TO, READ BACK BY AND VERIFIED WITH: E. SINCLAIR PHARMD, AT 1458 04/11/23 D. VANHOOK    Staphylococcus epidermidis NOT DETECTED NOT DETECTED Final   Staphylococcus lugdunensis NOT DETECTED NOT DETECTED Final   Streptococcus species NOT DETECTED NOT DETECTED Final   Streptococcus agalactiae NOT DETECTED NOT DETECTED Final   Streptococcus pneumoniae NOT DETECTED NOT DETECTED Final   Streptococcus pyogenes NOT DETECTED NOT DETECTED Final   A.calcoaceticus-baumannii NOT DETECTED NOT DETECTED Final   Bacteroides fragilis NOT DETECTED NOT DETECTED Final   Enterobacterales DETECTED (A) NOT DETECTED Final    Comment: Enterobacterales represent a large order of gram negative bacteria, not  a single organism. Refer to culture for further identification. CRITICAL RESULT CALLED TO, READ BACK BY AND VERIFIED WITH: E. SINCLAIR PHARMD, AT 1458 04/11/23 D. VANHOOK    Enterobacter cloacae complex NOT DETECTED NOT DETECTED Final   Escherichia coli NOT DETECTED NOT DETECTED Final   Klebsiella aerogenes NOT DETECTED NOT DETECTED Final   Klebsiella oxytoca NOT DETECTED NOT DETECTED Final   Klebsiella pneumoniae NOT DETECTED NOT DETECTED Final   Proteus species NOT DETECTED NOT DETECTED Final   Salmonella species NOT DETECTED NOT DETECTED Final   Serratia marcescens NOT DETECTED NOT DETECTED Final   Haemophilus influenzae NOT DETECTED NOT DETECTED Final   Neisseria meningitidis NOT DETECTED NOT DETECTED Final   Pseudomonas aeruginosa NOT DETECTED NOT DETECTED Final   Stenotrophomonas maltophilia NOT DETECTED NOT DETECTED Final   Candida albicans NOT DETECTED NOT DETECTED Final   Candida auris NOT DETECTED NOT DETECTED Final   Candida glabrata NOT DETECTED NOT DETECTED Final   Candida krusei NOT DETECTED NOT DETECTED Final   Candida parapsilosis NOT DETECTED NOT DETECTED Final   Candida tropicalis NOT DETECTED NOT DETECTED Final   Cryptococcus neoformans/gattii NOT DETECTED NOT DETECTED Final    CTX-M ESBL NOT DETECTED NOT DETECTED Final   Carbapenem resistance IMP NOT DETECTED NOT DETECTED Final   Carbapenem resistance KPC NOT DETECTED NOT DETECTED Final   Meth resistant mecA/C and MREJ DETECTED (A) NOT DETECTED Final    Comment: CRITICAL RESULT CALLED TO, READ BACK BY AND VERIFIED WITH: E. SINCLAIR PHARMD, AT 1458 04/11/23 D. VANHOOK    Carbapenem resistance NDM NOT DETECTED NOT DETECTED Final   Carbapenem resist OXA 48 LIKE NOT DETECTED NOT DETECTED Final   Carbapenem resistance VIM NOT DETECTED NOT DETECTED Final    Comment: Performed at [facility], [address] [place], [place] [address]  Blood culture (routine x 2)     Status: Abnormal   Collection Time: 04/10/23 11:09 PM   Specimen: BLOOD  Result Value Ref Range Status   Specimen Description BLOOD SITE NOT SPECIFIED  Final   Special Requests   Final    BOTTLES DRAWN AEROBIC AND ANAEROBIC Blood Culture results may not be optimal due to an excessive volume of blood received in culture bottles   Culture  Setup Time   Final    GRAM POSITIVE COCCI IN CLUSTERS IN BOTH AEROBIC AND ANAEROBIC BOTTLES GRAM NEGATIVE RODS AEROBIC BOTTLE ONLY    Culture (A)  Final    STAPHYLOCOCCUS AUREUS PROVIDENCIA RETTGERI SUSCEPTIBILITIES PERFORMED ON PREVIOUS CULTURE WITHIN THE LAST 5 DAYS. Performed at [facility], [address] [place], [place] [address]    Report Status 04/13/2023 FINAL  Final  Resp panel by RT-PCR (RSV, Flu A&B, Covid) Anterior Nasal Swab     Status: None   Collection Time: 04/10/23 11:47 PM   Specimen: Anterior Nasal Swab  Result Value Ref Range Status   SARS Coronavirus 2 by RT PCR NEGATIVE NEGATIVE Final   Influenza A by PCR NEGATIVE NEGATIVE Final   Influenza B by PCR NEGATIVE NEGATIVE Final    Comment: (NOTE) The Xpert Xpress SARS-CoV-2/FLU/RSV plus assay is intended as an aid in the diagnosis of influenza from Nasopharyngeal swab specimens and should not be used as a sole basis for  treatment. Nasal washings and aspirates are unacceptable for Xpert Xpress SARS-CoV-2/FLU/RSV testing.  Fact Sheet for Patients: BloggerCourse.com  Fact Sheet for Healthcare Providers: SeriousBroker.it  This test is not yet approved or cleared by the [place] FDA and has been authorized for detection and/or diagnosis of SARS-CoV-2 by FDA under an Emergency Use Authorization (EUA). This EUA will remain in effect (meaning this test can be used) for the duration of the COVID-19 declaration under Section 564(b)(1) of the Act, 21 U.S.C. section 360bbb-3(b)(1), unless the authorization is terminated or revoked.     Resp Syncytial Virus by PCR NEGATIVE NEGATIVE Final    Comment: (NOTE) Fact Sheet for Patients: BloggerCourse.com  Fact Sheet for Healthcare Providers: SeriousBroker.it  This test is not yet approved or cleared by the [place] FDA and has been authorized for detection and/or diagnosis of SARS-CoV-2 by FDA under an Emergency Use Authorization (EUA). This EUA will remain in effect (meaning this test can be used) for the duration of the COVID-19 declaration under Section 564(b)(1) of the Act, 21 U.S.C. section 360bbb-3(b)(1), unless the authorization is terminated or revoked.  Performed at [facility], [address], [place], [place] [address]   Respiratory (~20 pathogens) panel by PCR     Status: None   Collection Time: 04/11/23  8:20 AM   Specimen: Nasopharyngeal Swab; Respiratory  Result Value Ref Range Status   Adenovirus NOT DETECTED NOT DETECTED Final   Coronavirus 229E NOT DETECTED NOT DETECTED Final    Comment: (NOTE) The Coronavirus on the Respiratory Panel, DOES NOT test for the novel  Coronavirus (2019 nCoV)    Coronavirus HKU1 NOT DETECTED NOT DETECTED Final   Coronavirus NL63 NOT DETECTED NOT DETECTED Final   Coronavirus OC43 NOT DETECTED  NOT DETECTED Final   Metapneumovirus NOT DETECTED NOT DETECTED Final   Rhinovirus / Enterovirus NOT DETECTED NOT DETECTED Final   Influenza A NOT DETECTED NOT DETECTED Final   Influenza B NOT DETECTED NOT DETECTED Final   Parainfluenza Virus 1 NOT DETECTED NOT DETECTED Final   Parainfluenza Virus 2 NOT DETECTED NOT DETECTED Final   Parainfluenza Virus 3 NOT DETECTED NOT DETECTED Final   Parainfluenza Virus 4 NOT DETECTED NOT DETECTED Final   Respiratory Syncytial Virus NOT DETECTED NOT DETECTED Final   Bordetella pertussis NOT DETECTED NOT DETECTED Final   Bordetella Parapertussis NOT DETECTED NOT DETECTED Final   Chlamydophila pneumoniae NOT DETECTED NOT DETECTED Final   Mycoplasma pneumoniae NOT DETECTED NOT DETECTED Final    Comment: Performed at [facility], [address] [place], [place] [address]  Culture, blood (Routine X 2) w Reflex to ID Panel     Status: None   Collection Time: 04/12/23  2:54 AM   Specimen: BLOOD RIGHT HAND  Result Value Ref Range Status   Specimen Description BLOOD RIGHT HAND  Final   Special Requests   Final    BOTTLES DRAWN AEROBIC ONLY Blood Culture adequate volume   Culture   Final    NO GROWTH 5 DAYS Performed at [facility], [address] [place], [place] [address]    Report Status 04/17/2023 FINAL  Final  Culture, blood (Routine X 2) w Reflex to ID Panel     Status: None   Collection Time: 04/12/23  2:55 AM   Specimen: BLOOD RIGHT ARM  Result Value Ref Range Status   Specimen Description BLOOD RIGHT ARM  Final   Special Requests   Final    BOTTLES DRAWN AEROBIC ONLY Blood Culture adequate volume   Culture   Final    NO GROWTH 5 DAYS Performed at [facility], [address], [place], [place] [address]    Report  Status 04/17/2023 FINAL  Final    Labs: CBC: Recent Labs  Lab 05/01/23 0355  WBC 5.9  HGB 11.7*  HCT 36.5*  MCV 97.6  PLT 255   Basic Metabolic Panel: Recent Labs  Lab 05/01/23 0355  NA 137  K  4.6  CL 104  CO2 26  GLUCOSE 191*  BUN 21  CREATININE 0.64  CALCIUM 9.2   Liver Function Tests: No results for input(s): "AST", "ALT", "ALKPHOS", "BILITOT", "PROT", "ALBUMIN" in the last 168 hours. CBG: Recent Labs  Lab 05/03/23 1431 05/03/23 1803 05/03/23 2234 05/04/23 0819 05/04/23 1154  GLUCAP 236* 193* 222* 236* 216*    Discharge time spent: greater than 30 minutes.  Signed: Brendia Sacks, MD [facility] 05/04/2023

## 2023-05-04 NOTE — Progress Notes (Signed)
Pt is in a calm stable condition. Discharge instructions are given to pt and explained to him and wife. All monitors have been removed. Pt has been discharged with home health to aided in IV ABX admin. All questions have been answered. PT has been release to [facility].

## 2023-05-04 NOTE — TOC Transition Note (Addendum)
Transition of Care [facility]) - CM/SW Discharge Note   Patient Details  Name: Micheal Moore. MRN: [id number] Date of Birth: [birth year]  [facility] CM/SW Contact:  Lawerance Sabal, RN Phone Number: 05/04/2023, 12:46 PM   Clinical Narrative:     Notified by Tonny Bollman that patient has had meds for home delivered and plan is for wife to administer, Pam has arranged for nurse to see patient this week for PICC care and follow up.  Patient will transport by ambulance. Forms placed on chart  [facility] will pick up after 3pm  Wife updated over the phone   Final next level of care: Home w Home Health Services Barriers to Discharge: No Barriers Identified   Patient Goals and CMS Choice CMS Medicare.gov Compare Post Acute Care list provided to:: Patient Choice offered to / list presented to : Patient  Discharge Placement                         Discharge Plan and Services Additional resources added to the After Visit Summary for     Discharge Planning Services: CM Consult Post Acute Care Choice: Skilled Nursing Facility                               Social Determinants of Health (SDOH) Interventions SDOH Screenings   Food Insecurity: No Food Insecurity (04/24/2023)  Housing: Low Risk  (04/24/2023)  Transportation Needs: No Transportation Needs (04/24/2023)  Utilities: Not At Risk (04/24/2023)  Tobacco Use: High Risk (04/24/2023)     Readmission Risk Interventions    04/25/2023    1:04 PM 04/12/2023    4:47 PM 07/25/2022    2:42 PM  Readmission Risk Prevention Plan  Transportation Screening Complete Complete   PCP or Specialist Appt within 5-7 Days     PCP or Specialist Appt within 3-5 Days Complete Complete   Home Care Screening     Medication Review (RN CM)     HRI or Home Care Consult Complete Complete   Social Work Consult for Recovery Care Planning/Counseling Complete Complete   Palliative Care Screening Complete Not Applicable   Medication  Review Oceanographer) Complete Referral to Pharmacy      Information is confidential and restricted. Go to Review Flowsheets to unlock data.

## 2023-06-19 ENCOUNTER — Inpatient Hospital Stay (HOSPITAL_COMMUNITY)
Admission: EM | Admit: 2023-06-19 | Discharge: 2023-07-05 | DRG: 617 | Disposition: A | Discharge status: Skilled Nursing Facility | Payer: Medicare HMO | Attending: Internal Medicine | Admitting: Internal Medicine

## 2023-06-19 ENCOUNTER — Emergency Department (HOSPITAL_COMMUNITY): Payer: Medicare HMO

## 2023-06-19 DIAGNOSIS — E1140 Type 2 diabetes mellitus with diabetic neuropathy, unspecified: Secondary | ICD-10-CM | POA: Diagnosis present

## 2023-06-19 DIAGNOSIS — Z85820 Personal history of malignant melanoma of skin: Secondary | ICD-10-CM

## 2023-06-19 DIAGNOSIS — F17210 Nicotine dependence, cigarettes, uncomplicated: Secondary | ICD-10-CM | POA: Diagnosis present

## 2023-06-19 DIAGNOSIS — Z6823 Body mass index (BMI) 23.0-23.9, adult: Secondary | ICD-10-CM

## 2023-06-19 DIAGNOSIS — I951 Orthostatic hypotension: Secondary | ICD-10-CM | POA: Diagnosis present

## 2023-06-19 DIAGNOSIS — Z794 Long term (current) use of insulin: Secondary | ICD-10-CM | POA: Diagnosis not present

## 2023-06-19 DIAGNOSIS — M86162 Other acute osteomyelitis, left tibia and fibula: Secondary | ICD-10-CM | POA: Diagnosis not present

## 2023-06-19 DIAGNOSIS — M8600 Acute hematogenous osteomyelitis, unspecified site: Secondary | ICD-10-CM | POA: Diagnosis not present

## 2023-06-19 DIAGNOSIS — E7800 Pure hypercholesterolemia, unspecified: Secondary | ICD-10-CM | POA: Diagnosis not present

## 2023-06-19 DIAGNOSIS — E11621 Type 2 diabetes mellitus with foot ulcer: Secondary | ICD-10-CM | POA: Diagnosis not present

## 2023-06-19 DIAGNOSIS — Z8614 Personal history of Methicillin resistant Staphylococcus aureus infection: Secondary | ICD-10-CM | POA: Diagnosis not present

## 2023-06-19 DIAGNOSIS — Z1623 Resistance to quinolones and fluoroquinolones: Secondary | ICD-10-CM | POA: Diagnosis present

## 2023-06-19 DIAGNOSIS — Z8619 Personal history of other infectious and parasitic diseases: Secondary | ICD-10-CM | POA: Diagnosis not present

## 2023-06-19 DIAGNOSIS — Z79899 Other long term (current) drug therapy: Secondary | ICD-10-CM

## 2023-06-19 DIAGNOSIS — I2510 Atherosclerotic heart disease of native coronary artery without angina pectoris: Secondary | ICD-10-CM | POA: Diagnosis present

## 2023-06-19 DIAGNOSIS — Z96642 Presence of left artificial hip joint: Secondary | ICD-10-CM | POA: Diagnosis present

## 2023-06-19 DIAGNOSIS — E785 Hyperlipidemia, unspecified: Secondary | ICD-10-CM | POA: Diagnosis present

## 2023-06-19 DIAGNOSIS — E1169 Type 2 diabetes mellitus with other specified complication: Secondary | ICD-10-CM | POA: Diagnosis present

## 2023-06-19 DIAGNOSIS — B9562 Methicillin resistant Staphylococcus aureus infection as the cause of diseases classified elsewhere: Secondary | ICD-10-CM | POA: Diagnosis present

## 2023-06-19 DIAGNOSIS — M86262 Subacute osteomyelitis, left tibia and fibula: Secondary | ICD-10-CM | POA: Diagnosis not present

## 2023-06-19 DIAGNOSIS — Z7401 Bed confinement status: Secondary | ICD-10-CM | POA: Diagnosis not present

## 2023-06-19 DIAGNOSIS — E1165 Type 2 diabetes mellitus with hyperglycemia: Secondary | ICD-10-CM | POA: Diagnosis present

## 2023-06-19 DIAGNOSIS — E11628 Type 2 diabetes mellitus with other skin complications: Secondary | ICD-10-CM | POA: Diagnosis present

## 2023-06-19 DIAGNOSIS — E871 Hypo-osmolality and hyponatremia: Secondary | ICD-10-CM | POA: Diagnosis present

## 2023-06-19 DIAGNOSIS — Z7984 Long term (current) use of oral hypoglycemic drugs: Secondary | ICD-10-CM | POA: Diagnosis not present

## 2023-06-19 DIAGNOSIS — E1151 Type 2 diabetes mellitus with diabetic peripheral angiopathy without gangrene: Secondary | ICD-10-CM | POA: Diagnosis present

## 2023-06-19 DIAGNOSIS — Z7982 Long term (current) use of aspirin: Secondary | ICD-10-CM

## 2023-06-19 DIAGNOSIS — E440 Moderate protein-calorie malnutrition: Secondary | ICD-10-CM | POA: Diagnosis present

## 2023-06-19 DIAGNOSIS — E1369 Other specified diabetes mellitus with other specified complication: Secondary | ICD-10-CM | POA: Diagnosis not present

## 2023-06-19 DIAGNOSIS — I252 Old myocardial infarction: Secondary | ICD-10-CM | POA: Diagnosis not present

## 2023-06-19 DIAGNOSIS — R262 Difficulty in walking, not elsewhere classified: Secondary | ICD-10-CM | POA: Diagnosis not present

## 2023-06-19 DIAGNOSIS — N401 Enlarged prostate with lower urinary tract symptoms: Secondary | ICD-10-CM | POA: Diagnosis present

## 2023-06-19 DIAGNOSIS — Z7952 Long term (current) use of systemic steroids: Secondary | ICD-10-CM | POA: Diagnosis not present

## 2023-06-19 DIAGNOSIS — M869 Osteomyelitis, unspecified: Secondary | ICD-10-CM | POA: Diagnosis not present

## 2023-06-19 DIAGNOSIS — Z823 Family history of stroke: Secondary | ICD-10-CM

## 2023-06-19 DIAGNOSIS — L089 Local infection of the skin and subcutaneous tissue, unspecified: Secondary | ICD-10-CM | POA: Diagnosis not present

## 2023-06-19 DIAGNOSIS — I5032 Chronic diastolic (congestive) heart failure: Secondary | ICD-10-CM | POA: Diagnosis present

## 2023-06-19 DIAGNOSIS — L97509 Non-pressure chronic ulcer of other part of unspecified foot with unspecified severity: Secondary | ICD-10-CM | POA: Diagnosis not present

## 2023-06-19 DIAGNOSIS — M8608 Acute hematogenous osteomyelitis, other sites: Secondary | ICD-10-CM | POA: Diagnosis not present

## 2023-06-19 DIAGNOSIS — I2583 Coronary atherosclerosis due to lipid rich plaque: Secondary | ICD-10-CM | POA: Diagnosis not present

## 2023-06-19 DIAGNOSIS — R338 Other retention of urine: Secondary | ICD-10-CM | POA: Diagnosis not present

## 2023-06-19 DIAGNOSIS — Z955 Presence of coronary angioplasty implant and graft: Secondary | ICD-10-CM | POA: Diagnosis not present

## 2023-06-19 DIAGNOSIS — A4902 Methicillin resistant Staphylococcus aureus infection, unspecified site: Secondary | ICD-10-CM | POA: Diagnosis not present

## 2023-06-19 DIAGNOSIS — S81802A Unspecified open wound, left lower leg, initial encounter: Secondary | ICD-10-CM | POA: Diagnosis not present

## 2023-06-19 DIAGNOSIS — Z833 Family history of diabetes mellitus: Secondary | ICD-10-CM

## 2023-06-19 DIAGNOSIS — Z7902 Long term (current) use of antithrombotics/antiplatelets: Secondary | ICD-10-CM

## 2023-06-19 DIAGNOSIS — I739 Peripheral vascular disease, unspecified: Secondary | ICD-10-CM | POA: Diagnosis present

## 2023-06-19 DIAGNOSIS — Z9861 Coronary angioplasty status: Secondary | ICD-10-CM

## 2023-06-19 HISTORY — DX: Osteomyelitis, unspecified: M86.9

## 2023-06-19 LAB — BASIC METABOLIC PANEL
Anion gap: 14 (ref 5–15)
BUN: 18 mg/dL (ref 8–23)
CO2: 22 mmol/L (ref 22–32)
Calcium: 9 mg/dL (ref 8.9–10.3)
Chloride: 98 mmol/L (ref 98–111)
Creatinine, Ser: 0.89 mg/dL (ref 0.61–1.24)
GFR, Estimated: >60 mL/min (ref >60)
Glucose, Bld: 149 mg/dL — ABNORMAL HIGH (ref 70–99)
Potassium: 4 mmol/L (ref 3.5–5.1)
Sodium: 134 mmol/L — ABNORMAL LOW (ref 135–145)

## 2023-06-19 LAB — CBC
HCT: 41.1 % (ref 39.0–52.0)
HCT: 40 % (ref 39.0–52.0)
Hemoglobin: 13.1 g/dL (ref 13.0–17.0)
Hemoglobin: 12.7 g/dL — ABNORMAL LOW (ref 13.0–17.0)
MCH: 29.3 pg (ref 26.0–34.0)
MCH: 29.2 pg (ref 26.0–34.0)
MCHC: 31.9 g/dL (ref 30.0–36.0)
MCHC: 31.8 g/dL (ref 30.0–36.0)
MCV: 91.9 fL (ref 80.0–100.0)
MCV: 92 fL (ref 80.0–100.0)
Platelets: 362 10*3/uL (ref 150–400)
Platelets: 377 10*3/uL (ref 150–400)
RBC: 4.47 MIL/uL (ref 4.22–5.81)
RBC: 4.35 MIL/uL (ref 4.22–5.81)
RDW: 13.8 % (ref 11.5–15.5)
RDW: 13.8 % (ref 11.5–15.5)
WBC: 6.8 10*3/uL (ref 4.0–10.5)
WBC: 6.8 10*3/uL (ref 4.0–10.5)
nRBC: 0 % (ref 0.0–0.2)
nRBC: 0 % (ref 0.0–0.2)

## 2023-06-19 LAB — GLUCOSE, CAPILLARY
Glucose-Capillary: 153 mg/dL — ABNORMAL HIGH (ref 70–99)
Glucose-Capillary: 238 mg/dL — ABNORMAL HIGH (ref 70–99)

## 2023-06-19 LAB — C-REACTIVE PROTEIN: CRP: 11.6 mg/dL — ABNORMAL HIGH (ref <1.0)

## 2023-06-19 LAB — I-STAT CG4 LACTIC ACID, ED: Lactic Acid, Venous: 1.7 mmol/L (ref 0.5–1.9)

## 2023-06-19 LAB — HEMOGLOBIN A1C
Hgb A1c MFr Bld: 8.4 % — ABNORMAL HIGH (ref 4.8–5.6)
Mean Plasma Glucose: 194.38 mg/dL

## 2023-06-19 MED ORDER — ENOXAPARIN SODIUM 40 MG/0.4ML IJ SOSY
40.0000 mg | PREFILLED_SYRINGE | INTRAMUSCULAR | Status: DC
  Administered 2023-06-19 – 2023-07-04 (×16): 40 mg via SUBCUTANEOUS
  Filled 2023-06-19 (×16): qty 0.4

## 2023-06-19 MED ORDER — ASPIRIN 81 MG PO TBEC
81.0000 mg | DELAYED_RELEASE_TABLET | Freq: Every day | ORAL | Status: DC
  Administered 2023-06-20 – 2023-07-05 (×15): 81 mg via ORAL
  Filled 2023-06-19 (×16): qty 1

## 2023-06-19 MED ORDER — VANCOMYCIN HCL IN DEXTROSE 1-5 GM/200ML-% IV SOLN
1000.0000 mg | Freq: Once | INTRAVENOUS | Status: DC
  Filled 2023-06-19: qty 200

## 2023-06-19 MED ORDER — FENTANYL CITRATE PF 50 MCG/ML IJ SOSY
25.0000 ug | PREFILLED_SYRINGE | INTRAMUSCULAR | Status: DC | PRN
  Administered 2023-06-29: 25 ug via INTRAVENOUS
  Filled 2023-06-19: qty 1

## 2023-06-19 MED ORDER — SODIUM CHLORIDE 0.9 % IV SOLN: 250.0000 mL | INTRAVENOUS | Status: DC | PRN

## 2023-06-19 MED ORDER — VANCOMYCIN HCL IN DEXTROSE 1-5 GM/200ML-% IV SOLN
1000.0000 mg | Freq: Once | INTRAVENOUS | Status: AC
  Administered 2023-06-19: 1000 mg via INTRAVENOUS
  Filled 2023-06-19: qty 200

## 2023-06-19 MED ORDER — ALBUTEROL SULFATE (2.5 MG/3ML) 0.083% IN NEBU: 2.5000 mg | INHALATION_SOLUTION | RESPIRATORY_TRACT | Status: DC | PRN

## 2023-06-19 MED ORDER — ACETAMINOPHEN 325 MG PO TABS
650.0000 mg | ORAL_TABLET | Freq: Four times a day (QID) | ORAL | Status: DC | PRN
  Administered 2023-06-19 – 2023-07-04 (×6): 650 mg via ORAL
  Filled 2023-06-19 (×6): qty 2

## 2023-06-19 MED ORDER — SODIUM CHLORIDE 0.9 % IV BOLUS
500.0000 mL | Freq: Once | INTRAVENOUS | Status: AC
  Administered 2023-06-19: 500 mL via INTRAVENOUS

## 2023-06-19 MED ORDER — PIPERACILLIN-TAZOBACTAM 3.375 G IVPB
3.3750 g | INTRAVENOUS | Status: AC
  Administered 2023-06-19: 3.375 g via INTRAVENOUS
  Filled 2023-06-19: qty 50

## 2023-06-19 MED ORDER — ACETAMINOPHEN 650 MG RE SUPP: 650.0000 mg | Freq: Four times a day (QID) | RECTAL | Status: DC | PRN

## 2023-06-19 MED ORDER — BARRIER CREAM NON-SPECIFIED: 1.0000 | TOPICAL_CREAM | Freq: Two times a day (BID) | TOPICAL | Status: DC | PRN

## 2023-06-19 MED ORDER — INSULIN DETEMIR 100 UNIT/ML FLEXPEN: 15.0000 [IU] | PEN_INJECTOR | Freq: Every day | SUBCUTANEOUS | Status: DC

## 2023-06-19 MED ORDER — VANCOMYCIN HCL 1500 MG/300ML IV SOLN
1500.0000 mg | Freq: Two times a day (BID) | INTRAVENOUS | Status: DC
  Administered 2023-06-20 – 2023-06-21 (×3): 1500 mg via INTRAVENOUS
  Filled 2023-06-19 (×3): qty 300

## 2023-06-19 MED ORDER — BISACODYL 10 MG RE SUPP: 10.0000 mg | Freq: Every day | RECTAL | Status: DC | PRN

## 2023-06-19 MED ORDER — TICAGRELOR 90 MG PO TABS
90.0000 mg | ORAL_TABLET | Freq: Two times a day (BID) | ORAL | Status: DC
  Administered 2023-06-19 – 2023-07-05 (×31): 90 mg via ORAL
  Filled 2023-06-19 (×31): qty 1

## 2023-06-19 MED ORDER — ONDANSETRON HCL 4 MG/2ML IJ SOLN: 4.0000 mg | Freq: Four times a day (QID) | INTRAMUSCULAR | Status: DC | PRN

## 2023-06-19 MED ORDER — ROSUVASTATIN CALCIUM 5 MG PO TABS
5.0000 mg | ORAL_TABLET | ORAL | Status: DC
  Administered 2023-06-20 – 2023-07-04 (×5): 5 mg via ORAL
  Filled 2023-06-19 (×8): qty 1

## 2023-06-19 MED ORDER — POLYETHYLENE GLYCOL 3350 17 G PO PACK
17.0000 g | PACK | Freq: Every day | ORAL | Status: DC
  Administered 2023-06-20 – 2023-07-05 (×15): 17 g via ORAL
  Filled 2023-06-19 (×16): qty 1

## 2023-06-19 MED ORDER — SODIUM CHLORIDE 0.9 % IV SOLN
2.0000 g | INTRAVENOUS | Status: DC
  Administered 2023-06-19 – 2023-06-22 (×3): 2 g via INTRAVENOUS
  Filled 2023-06-19 (×4): qty 20

## 2023-06-19 MED ORDER — GADOBUTROL 1 MMOL/ML IV SOLN
9.0000 mL | Freq: Once | INTRAVENOUS | Status: AC | PRN
  Administered 2023-06-19: 9 mL via INTRAVENOUS

## 2023-06-19 MED ORDER — INSULIN ASPART 100 UNIT/ML IJ SOLN
0.0000 [IU] | Freq: Three times a day (TID) | INTRAMUSCULAR | Status: DC
  Administered 2023-06-20 (×3): 2 [IU] via SUBCUTANEOUS
  Administered 2023-06-21: 3 [IU] via SUBCUTANEOUS
  Administered 2023-06-21 (×2): 1 [IU] via SUBCUTANEOUS
  Administered 2023-06-22 (×2): 3 [IU] via SUBCUTANEOUS
  Administered 2023-06-22 – 2023-06-23 (×2): 5 [IU] via SUBCUTANEOUS
  Administered 2023-06-23: 2 [IU] via SUBCUTANEOUS
  Administered 2023-06-23: 5 [IU] via SUBCUTANEOUS
  Administered 2023-06-24 (×2): 3 [IU] via SUBCUTANEOUS
  Administered 2023-06-24: 2 [IU] via SUBCUTANEOUS
  Administered 2023-06-25: 3 [IU] via SUBCUTANEOUS
  Administered 2023-06-25: 2 [IU] via SUBCUTANEOUS
  Administered 2023-06-25: 3 [IU] via SUBCUTANEOUS
  Administered 2023-06-26: 2 [IU] via SUBCUTANEOUS
  Administered 2023-06-26: 3 [IU] via SUBCUTANEOUS
  Administered 2023-06-26: 2 [IU] via SUBCUTANEOUS
  Administered 2023-06-27: 3 [IU] via SUBCUTANEOUS
  Administered 2023-06-27 (×2): 2 [IU] via SUBCUTANEOUS
  Administered 2023-06-28: 3 [IU] via SUBCUTANEOUS
  Administered 2023-06-28 – 2023-06-30 (×5): 2 [IU] via SUBCUTANEOUS
  Administered 2023-06-30 (×2): 3 [IU] via SUBCUTANEOUS
  Administered 2023-07-01: 2 [IU] via SUBCUTANEOUS
  Administered 2023-07-01 – 2023-07-02 (×3): 3 [IU] via SUBCUTANEOUS
  Administered 2023-07-02: 2 [IU] via SUBCUTANEOUS
  Administered 2023-07-02 – 2023-07-03 (×2): 3 [IU] via SUBCUTANEOUS
  Administered 2023-07-03: 2 [IU] via SUBCUTANEOUS
  Administered 2023-07-03: 5 [IU] via SUBCUTANEOUS
  Administered 2023-07-04: 1 [IU] via SUBCUTANEOUS
  Administered 2023-07-04 (×2): 3 [IU] via SUBCUTANEOUS
  Administered 2023-07-05: 1 [IU] via SUBCUTANEOUS
  Administered 2023-07-05 (×2): 3 [IU] via SUBCUTANEOUS

## 2023-06-19 MED ORDER — INSULIN DETEMIR 100 UNIT/ML ~~LOC~~ SOLN
20.0000 [IU] | Freq: Every day | SUBCUTANEOUS | Status: DC
  Administered 2023-06-19 – 2023-06-24 (×6): 20 [IU] via SUBCUTANEOUS
  Filled 2023-06-19 (×7): qty 0.2

## 2023-06-19 MED ORDER — SENNA 8.6 MG PO TABS
1.0000 | ORAL_TABLET | Freq: Two times a day (BID) | ORAL | Status: DC
  Administered 2023-06-19 – 2023-07-05 (×30): 8.6 mg via ORAL
  Filled 2023-06-19 (×32): qty 1

## 2023-06-19 MED ORDER — METRONIDAZOLE 500 MG/100ML IV SOLN
500.0000 mg | Freq: Two times a day (BID) | INTRAVENOUS | Status: DC
  Administered 2023-06-19 – 2023-06-21 (×3): 500 mg via INTRAVENOUS
  Filled 2023-06-19 (×4): qty 100

## 2023-06-19 MED ORDER — SODIUM CHLORIDE 0.9% FLUSH
3.0000 mL | Freq: Two times a day (BID) | INTRAVENOUS | Status: DC
  Administered 2023-06-19 – 2023-06-25 (×12): 3 mL via INTRAVENOUS

## 2023-06-19 MED ORDER — ONDANSETRON HCL 4 MG PO TABS: 4.0000 mg | ORAL_TABLET | Freq: Four times a day (QID) | ORAL | Status: DC | PRN

## 2023-06-19 MED ORDER — FLUDROCORTISONE ACETATE 0.1 MG PO TABS
0.2000 mg | ORAL_TABLET | Freq: Every day | ORAL | Status: DC
  Administered 2023-06-20 – 2023-07-05 (×15): 0.2 mg via ORAL
  Filled 2023-06-19 (×18): qty 2

## 2023-06-19 MED ORDER — OXYCODONE HCL 5 MG PO TABS: 5.0000 mg | ORAL_TABLET | ORAL | Status: DC | PRN

## 2023-06-19 MED ORDER — EMPAGLIFLOZIN 10 MG PO TABS
10.0000 mg | ORAL_TABLET | Freq: Every day | ORAL | Status: DC
  Administered 2023-06-20 – 2023-07-05 (×15): 10 mg via ORAL
  Filled 2023-06-19 (×16): qty 1

## 2023-06-19 MED ORDER — SODIUM CHLORIDE 0.9% FLUSH
3.0000 mL | INTRAVENOUS | Status: DC | PRN
  Administered 2023-06-21: 3 mL via INTRAVENOUS

## 2023-06-19 MED ORDER — VANCOMYCIN HCL IN DEXTROSE 1-5 GM/200ML-% IV SOLN: 1000.0000 mg | INTRAVENOUS | Status: DC

## 2023-06-19 NOTE — Progress Notes (Signed)
Patient ID: Micheal Moore., male   DOB: 08-28-1951, 72 y.o.   MRN: [id number]  Consult received but unable to see pt due to being in MRI. Plan for consultation in AM.    Freeman Caldron, PA-C Orthopedic Surgery [phone]

## 2023-06-19 NOTE — ED Notes (Signed)
I called [phone] and the pts nurse had a recording on her phone  handoff has been sent for 15-20 minutes  the pt is still in mri  and meanwhile I guess the admitting doctor has  written many orders that have not been done because the pt was in mri

## 2023-06-19 NOTE — ED Notes (Signed)
ED TO INPATIENT HANDOFF REPORT  ED Nurse Name and Phone #: [phone]  S Name/Age/Gender Micheal Moore. 72 y.o. male Room/Bed: 028C/028C  Code Status   Code Status: Prior  Home/SNF/Other Home Patient oriented to: self, place, time, and situation Is this baseline? Yes   Triage Complete: Triage complete  Chief Complaint Osteomyelitis [facility]) [M86.9]  Triage Note Pt BIB EMS from home, bedbound at baseline, coming in for wound check on left lower leg looks to be infected and deep with oozing. Aox4.    Allergies No Known Allergies  Level of Care/Admitting Diagnosis ED Disposition     ED Disposition  Admit   Condition  --   Comment  Hospital Area: [facility] [[id number]]  Level of Care: Telemetry Medical [[id number]]  May admit patient to [facility] or [facility] if equivalent level of care is available:: No  Covid Evaluation: Asymptomatic - no recent exposure (last 10 days) testing not required  Diagnosis: Osteomyelitis [facility]) [[id number]]  Admitting Physician: Maretta Bees [[id number]]  Attending Physician: Maretta Bees [[id number]]  Bed request comments: prefer [facility] if possible  Certification:: I certify this patient will need inpatient services for at least 2 midnights  Expected Medical Readiness: 06/22/2023          B Medical/Surgery History Past Medical History:  Diagnosis Date   Cancer (HCC)    right elbow melanoma   Diabetes mellitus without complication (HCC)    Type II   Dizziness    DVT (deep venous thrombosis) (HCC)    right leg   Frequent falls    Near syncope    Syncope    Past Surgical History:  Procedure Laterality Date   AMPUTATION TOE Left 04/28/2020   Procedure: AMPUTATION LEFT GREAT TOE;  Surgeon: Nadara Mustard, MD;  Location: [facility] OR;  Service: Orthopedics;  Laterality: Left;   CORONARY/GRAFT ACUTE MI REVASCULARIZATION N/A 01/15/2023   Procedure: Coronary/Graft Acute MI Revascularization;  Surgeon: Orbie Pyo, MD;   Location: [facility] INVASIVE CV LAB;  Service: Cardiovascular;  Laterality: N/A;   INTRAMEDULLARY (IM) NAIL INTERTROCHANTERIC Right 02/23/2022   Procedure: INTRAMEDULLARY (IM) NAIL INTERTROCHANTRIC, RIGHT;  Surgeon: Samson Frederic, MD;  Location: [facility] ORS;  Service: Orthopedics;  Laterality: Right;   LEFT HEART CATH AND CORONARY ANGIOGRAPHY N/A 01/15/2023   Procedure: LEFT HEART CATH AND CORONARY ANGIOGRAPHY;  Surgeon: Orbie Pyo, MD;  Location: [facility] INVASIVE CV LAB;  Service: Cardiovascular;  Laterality: N/A;   LOWER EXTREMITY ANGIOGRAPHY Bilateral 01/18/2023   Procedure: Lower Extremity Angiography;  Surgeon: Nada Libman, MD;  Location: [facility] INVASIVE CV LAB;  Service: Cardiovascular;  Laterality: Bilateral;   MELANOMA EXCISION WITH SENTINEL LYMPH NODE BIOPSY Right 08/19/2020   Procedure: WIDE LOCAL EXCISION RIGHT ELBOW MELANOMA WITH RIGHT AXILLARY SENTINEL LYMPH NODE BIOPSY;  Surgeon: Fritzi Mandes, MD;  Location: [facility] OR;  Service: General;  Laterality: Right;  2nd incision in axilla   TEE WITHOUT CARDIOVERSION N/A 04/19/2023   Procedure: TRANSESOPHAGEAL ECHOCARDIOGRAM;  Surgeon: Little Ishikawa, MD;  Location: [facility] INVASIVE CV LAB;  Service: Cardiovascular;  Laterality: N/A;   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   Procedure: TOTAL HIP ARTHROPLASTY ANTERIOR APPROACH;  Surgeon: Samson Frederic, MD;  Location: [facility] ORS;  Service: Orthopedics;  Laterality: Left;   TOTAL HIP ARTHROPLASTY Left 08/24/2022   Procedure: OPEN REDUCTION, HEAD AND LINER EXCHANGE;  Surgeon: Samson Frederic, MD;  Location: [facility] ORS;  Service: Orthopedics;  Laterality: Left;     A IV  Location/Drains/Wounds Patient Lines/Drains/Airways Status     Active Line/Drains/Airways     Name Placement date Placement time Site Days   Peripheral IV 06/19/23 20 G Left Antecubital 06/19/23  1354  Antecubital  less than 1   PICC Single Lumen 04/19/23 Right Basilic 39 cm 0 cm 04/19/23  1308  Basilic  61   Negative Pressure Wound Therapy Hip  Left 08/24/22  1316  --  299   Pressure Injury 01/15/23 Buttocks Right Stage 2 -  Partial thickness loss of dermis presenting as a shallow open injury with a red, pink wound bed without slough. 01/15/23  --  -- 155   Pressure Injury 01/15/23 Heel Right;Left Stage 2 -  Partial thickness loss of dermis presenting as a shallow open injury with a red, pink wound bed without slough. dry scabbed healing pressure injuries to bilat heels 01/15/23  --  -- 155   Pressure Injury 01/15/23 Foot Left;Anterior Deep Tissue Pressure Injury - Purple or maroon localized area of discolored intact skin or blood-filled blister due to damage of underlying soft tissue from pressure and/or shear. 01/15/23  --  -- 155   Pressure Injury 01/15/23 Foot Left;Anterior Deep Tissue Pressure Injury - Purple or maroon localized area of discolored intact skin or blood-filled blister due to damage of underlying soft tissue from pressure and/or shear. inner foot 01/15/23  --  -- 155   Pressure Injury 04/11/23 Sacrum Mid Stage 2 -  Partial thickness loss of dermis presenting as a shallow open injury with a red, pink wound bed without slough. 04/11/23  0300  -- 69   Wound / Incision (Open or Dehisced) 08/28/22 Irritant Dermatitis (Moisture Associated Skin Damage) Buttocks Right;Left 08/28/22  --  Buttocks  295   Wound / Incision (Open or Dehisced) 01/15/23 Other (Comment) Leg Left;Posterior 01/15/23  --  Leg  155   Wound / Incision (Open or Dehisced) 01/15/23 Other (Comment) Leg Right;Posterior 01/15/23  --  Leg  155   Wound / Incision (Open or Dehisced) 01/15/23 Other (Comment) Toe (Comment  which one) Right right great toe 01/15/23  --  Toe (Comment  which one)  155   Wound / Incision (Open or Dehisced) 01/15/23 Other (Comment) Toe (Comment  which one) Left;Right dry scab to left and right second toes 01/15/23  --  Toe (Comment  which one)  155   Wound / Incision (Open or Dehisced) 04/11/23 Irritant Dermatitis (Moisture Associated Skin  Damage) Buttocks Bilateral 04/11/23  0300  Buttocks  69   Wound / Incision (Open or Dehisced) 04/11/23 Diabetic ulcer Heel Left 04/11/23  0300  Heel  69   Wound / Incision (Open or Dehisced) 04/17/23 Non-pressure wound Pretibial Distal;Left 04/17/23  1500  Pretibial  63            Intake/Output Last 24 hours No intake or output data in the 24 hours ending 06/19/23 1554  Labs/Imaging Results for orders placed or performed during the hospital encounter of 06/19/23 (from the past 48 hour(s))  CBC     Status: None   Collection Time: 06/19/23  1:53 PM  Result Value Ref Range   WBC 6.8 4.0 - 10.5 K/uL   RBC 4.47 4.22 - 5.81 MIL/uL   Hemoglobin 13.1 13.0 - 17.0 g/dL   HCT 65.7 84.6 - 96.2 %   MCV 91.9 80.0 - 100.0 fL   MCH 29.3 26.0 - 34.0 pg   MCHC 31.9 30.0 - 36.0 g/dL   RDW 95.2 84.1 - 32.4 %  Platelets 362 150 - 400 K/uL   nRBC 0.0 0.0 - 0.2 %    Comment: Performed at [facility], [address], [place], [place] [address]  I-Stat CG4 Lactic Acid     Status: None   Collection Time: 06/19/23  2:17 PM  Result Value Ref Range   Lactic Acid, Venous 1.7 0.5 - 1.9 mmol/L   DG Ankle Complete Left  Result Date: 06/19/2023 CLINICAL DATA:  Posterolateral left ankle diabetic wound. Evaluate for free air or gas. EXAM: LEFT ANKLE COMPLETE - 3+ VIEW COMPARISON:  Left foot radiographs 04/12/2023 FINDINGS: There is diffuse decreased bone mineralization. There is horizontal linear sclerosis within the expected region of the mid to lateral aspect of the distal tibial physis, similar to prior 04/12/2023 lateral foot radiograph appearing chronic. It could represent a subacute to older prior stress fracture. There is lucency and apparent concavity of the posterior soft tissues of the distal calf, centered approximately 10 cm proximal to the posterosuperior aspect of the calcaneus. There is longitudinal linear lucency at the posterior cortex of the adjacent fibula along an approximate 4 cm  craniocaudal length on lateral view, suspicious for bone erosion. There is relative lucency seen in this region of the distal fibular diaphysis on transverse view. Which Moderate distal fibula and adjacent lateral talar process degenerative spurring. Mild to moderate posterior and mild medial tibiotalar joint space narrowing. Moderate anterior and posterior tibial plafond degenerative osteophytes. Mild-to-moderate joint space narrowing and subchondral sclerosis throughout the midfoot. Mild-to-moderate plantar and mild posterior calcaneal heel spurs. No subcutaneous emphysema is seen. IMPRESSION: 1. Apparent soft tissue wound of the posterior distal calf, centered approximately 10 cm proximal to the posterosuperior aspect of the calcaneus. There is longitudinal linear lucency at the posterior cortex of the adjacent fibular diaphysis along an approximate 4 cm craniocaudal length concerning for bone erosion and acute osteomyelitis. 2. There is horizontal linear sclerosis within the expected region of the mid to lateral aspect of the distal tibial physis, similar to prior 04/12/2023 lateral foot radiograph appearing chronic. It could represent a subacute to older prior stress fracture. 3. Moderate tibiotalar and midfoot osteoarthritis. Electronically Signed   By: Neita Garnet M.D.   On: 06/19/2023 14:07    Pending Labs Unresulted Labs (From admission, onward)     Start     Ordered   06/19/23 1547  C-reactive protein  Once,   R        06/19/23 1546   06/19/23 1547  Hemoglobin A1c  Once,   R        06/19/23 1546   06/19/23 1505  Basic metabolic panel  Once,   STAT        06/19/23 1505   06/19/23 1312  Blood culture (routine x 2)  BLOOD CULTURE X 2,   R (with STAT occurrences)      06/19/23 1313            Vitals/Pain Today's Vitals   06/19/23 1246 06/19/23 1247 06/19/23 1522  BP: 113/76    Pulse: (!) 106    Resp: 16    Temp: 98.8 F (37.1 C)    TempSrc: Oral    SpO2: 97%    Weight:  88.5 kg    Height:  6\' 5"  (1.956 m)   PainSc:  0-No pain 0-No pain    Isolation Precautions No active isolations  Medications Medications  vancomycin (VANCOCIN) IVPB 1000 mg/200 mL premix (0 mg Intravenous Stopped 06/19/23 1518)  And  vancomycin (VANCOCIN) IVPB 1000 mg/200 mL premix (1,000 mg Intravenous New Bag/Given 06/19/23 1439)  gadobutrol (GADAVIST) 1 MMOL/ML injection 9 mL (has no administration in time range)  sodium chloride 0.9 % bolus 500 mL (0 mLs Intravenous Stopped 06/19/23 1519)  piperacillin-tazobactam (ZOSYN) IVPB 3.375 g (0 g Intravenous Stopped 06/19/23 1520)    Mobility non-ambulatory     Focused Assessments Cardiac Assessment Handoff:    Lab Results  Component Value Date   CKTOTAL 43 (L) 05/03/2023   Lab Results  Component Value Date   DDIMER 9.22 (H) 10/01/2022   Does the Patient currently have chest pain? No    R Recommendations: See Admitting Provider Note  Report given to:   Additional Notes:

## 2023-06-19 NOTE — ED Triage Notes (Signed)
Pt BIB EMS from home, bedbound at baseline, coming in for wound check on left lower leg looks to be infected and deep with oozing. Aox4.

## 2023-06-19 NOTE — ED Provider Notes (Signed)
[name] EMERGENCY DEPARTMENT AT [facility] Provider Note   CSN: [id number] Arrival date & time: 06/19/23  1233     History  Chief Complaint  Patient presents with   Wound Infection    Micheal Moore. is a 72 y.o. male with medical history of cancer, diabetes, dizziness, DVT, syncope, chronic diastolic CHF, tobacco abuse, osteomyelitis of great toe of left foot, cellulitis and diabetic foot.  Patient presents to ED for evaluation of diabetic wound to left posterior lateral ankle.  Patient states that this wound developed months ago when he was in rehab secondary to a hip fracture.  States that he bumped his ankle on the bedside railing at this time and this wound has progressively worsened.  States that the reason he came in today was because of the pain, states it was so severe he could not stand it.  Currently states that his pain is 0 out of 10 if he does not move.  He reports increased drainage from this wound.  States that the wound is draining foul-smelling discharge that is yellow in color.  Denies fevers, nausea vomiting, bodyaches or chills.  Reports he does not walk at baseline.  HPI     Home Medications Prior to Admission medications   Medication Sig Start Date End Date Taking? Authorizing Provider  acetaminophen (TYLENOL) 325 MG tablet Take 2 tablets (650 mg total) by mouth every 6 (six) hours as needed for mild pain, fever or headache. 04/22/23   Lonia Blood, MD  aspirin EC 81 MG tablet Take 1 tablet (81 mg total) by mouth daily. Swallow whole. 01/20/23   Lanae Boast, MD  barrier cream (NON-SPECIFIED) CREA Apply 1 Application topically 2 (two) times daily as needed. 05/04/23   Standley Brooking, MD  Blood Glucose Monitoring Suppl DEVI 1 each by Does not apply route 3 (three) times daily. May dispense any manufacturer covered by patient's insurance. 01/20/23   Lanae Boast, MD  BRILINTA 90 MG TABS tablet Take 90 mg by mouth 2 (two) times daily. 04/09/23    [provider]  Ensure Max Protein (ENSURE MAX PROTEIN) LIQD Take 330 mLs (11 oz total) by mouth 2 (two) times daily. Patient taking differently: Take 11 oz by mouth daily. 08/28/22   Lorin Glass, MD  fludrocortisone (FLORINEF) 0.1 MG tablet Take 2 tablets (0.2 mg total) by mouth daily. 03/05/22   Rolly Salter, MD  Glucagon, rDNA, (GLUCAGON EMERGENCY) 1 MG KIT Inject 1 mg into the skin as needed for up to 2 doses (Severe low blood sugar). 01/20/23   Lanae Boast, MD  Glucose Blood (BLOOD GLUCOSE TEST STRIPS) STRP 1 each by Does not apply route 3 (three) times daily. Use as directed to check blood sugar. May dispense any manufacturer covered by patient's insurance and fits patient's device. 01/20/23   Lanae Boast, MD  insulin aspart (NOVOLOG) 100 UNIT/ML FlexPen Inject 8 Units into the skin 3 (three) times daily with meals. Only take if eating a meal AND Blood Glucose (BG) is 80 or higher. 04/22/23 07/21/23  Lonia Blood, MD  insulin detemir (LEVEMIR) 100 UNIT/ML FlexPen Inject 20 Units into the skin at bedtime. DO NOT TAKE if your blood sugars and consistently running < 120 04/23/23   Lonia Blood, MD  Insulin Pen Needle (PEN NEEDLES) 31G X 5 MM MISC 1 each by Does not apply route 3 (three) times daily. May dispense any manufacturer covered by patient's insurance. 01/20/23   Kc,  Dayna Barker, MD  JARDIANCE 10 MG TABS tablet Take 10 mg by mouth daily. 04/09/23   [provider]  Lancet Device MISC 1 each by Does not apply route 3 (three) times daily. May dispense any manufacturer covered by patient's insurance. 01/20/23   Lanae Boast, MD  Lancets MISC 1 each by Does not apply route 3 (three) times daily. Use as directed to check blood sugar. May dispense any manufacturer covered by patient's insurance and fits patient's device. 01/20/23   Lanae Boast, MD  polyethylene glycol (MIRALAX / GLYCOLAX) 17 g packet Take 17 g by mouth daily. Patient taking differently: Take 17 g by mouth daily as needed  for mild constipation. 03/05/22   Rolly Salter, MD  rosuvastatin (CRESTOR) 5 MG tablet Take 5 mg by mouth 2 (two) times a week. 02/07/23   [provider]      Allergies    Patient has no known allergies.    Review of Systems   Review of Systems  Constitutional:  Negative for chills and fever.  Gastrointestinal:  Negative for nausea and vomiting.  Musculoskeletal:  Negative for myalgias.  Skin:  Positive for wound.  All other systems reviewed and are negative.   Physical Exam Updated Vital Signs BP 113/76 (BP Location: Right Arm)   Pulse (!) 106   Temp 98.8 F (37.1 C) (Oral)   Resp 16   Ht 6\' 5"  (1.956 m)   Wt 88.5 kg   SpO2 97%   BMI 23.12 kg/m  Physical Exam Vitals and nursing note reviewed.  Constitutional:      General: He is not in acute distress.    Appearance: Normal appearance. He is not ill-appearing, toxic-appearing or diaphoretic.  HENT:     Head: Normocephalic and atraumatic.     Nose: Nose normal.     Mouth/Throat:     Mouth: Mucous membranes are moist.     Pharynx: Oropharynx is clear.  Eyes:     Extraocular Movements: Extraocular movements intact.     Conjunctiva/sclera: Conjunctivae normal.     Pupils: Pupils are equal, round, and reactive to light.  Cardiovascular:     Rate and Rhythm: Normal rate and regular rhythm.  Pulmonary:     Effort: Pulmonary effort is normal.     Breath sounds: Normal breath sounds. No wheezing.  Abdominal:     General: Abdomen is flat. Bowel sounds are normal.     Palpations: Abdomen is soft.     Tenderness: There is no abdominal tenderness.  Musculoskeletal:     Cervical back: Normal range of motion and neck supple. No tenderness.     Comments: Amputation of left first MTP  Skin:    General: Skin is warm and dry.     Capillary Refill: Capillary refill takes less than 2 seconds.     Comments: Extensive wound to posterior lateral ankle with foul-smelling discharge, yellow.  Neurological:     Mental  Status: He is alert and oriented to person, place, and time.       ED Results / Procedures / Treatments   Labs (all labs ordered are listed, but only abnormal results are displayed) Labs Reviewed  CULTURE, BLOOD (ROUTINE X 2)  CULTURE, BLOOD (ROUTINE X 2)  CBC  BASIC METABOLIC PANEL  I-STAT CG4 LACTIC ACID, ED  I-STAT CG4 LACTIC ACID, ED    EKG None  Radiology DG Ankle Complete Left  Result Date: 06/19/2023 CLINICAL DATA:  Posterolateral left ankle diabetic wound. Evaluate for  free air or gas. EXAM: LEFT ANKLE COMPLETE - 3+ VIEW COMPARISON:  Left foot radiographs 04/12/2023 FINDINGS: There is diffuse decreased bone mineralization. There is horizontal linear sclerosis within the expected region of the mid to lateral aspect of the distal tibial physis, similar to prior 04/12/2023 lateral foot radiograph appearing chronic. It could represent a subacute to older prior stress fracture. There is lucency and apparent concavity of the posterior soft tissues of the distal calf, centered approximately 10 cm proximal to the posterosuperior aspect of the calcaneus. There is longitudinal linear lucency at the posterior cortex of the adjacent fibula along an approximate 4 cm craniocaudal length on lateral view, suspicious for bone erosion. There is relative lucency seen in this region of the distal fibular diaphysis on transverse view. Which Moderate distal fibula and adjacent lateral talar process degenerative spurring. Mild to moderate posterior and mild medial tibiotalar joint space narrowing. Moderate anterior and posterior tibial plafond degenerative osteophytes. Mild-to-moderate joint space narrowing and subchondral sclerosis throughout the midfoot. Mild-to-moderate plantar and mild posterior calcaneal heel spurs. No subcutaneous emphysema is seen. IMPRESSION: 1. Apparent soft tissue wound of the posterior distal calf, centered approximately 10 cm proximal to the posterosuperior aspect of the  calcaneus. There is longitudinal linear lucency at the posterior cortex of the adjacent fibular diaphysis along an approximate 4 cm craniocaudal length concerning for bone erosion and acute osteomyelitis. 2. There is horizontal linear sclerosis within the expected region of the mid to lateral aspect of the distal tibial physis, similar to prior 04/12/2023 lateral foot radiograph appearing chronic. It could represent a subacute to older prior stress fracture. 3. Moderate tibiotalar and midfoot osteoarthritis. Electronically Signed   By: Neita Garnet M.D.   On: 06/19/2023 14:07    Procedures Procedures   Medications Ordered in ED Medications  piperacillin-tazobactam (ZOSYN) IVPB 3.375 g (3.375 g Intravenous New Bag/Given 06/19/23 1438)  vancomycin (VANCOCIN) IVPB 1000 mg/200 mL premix (has no administration in time range)    And  vancomycin (VANCOCIN) IVPB 1000 mg/200 mL premix (1,000 mg Intravenous New Bag/Given 06/19/23 1439)  sodium chloride 0.9 % bolus 500 mL (500 mLs Intravenous New Bag/Given 06/19/23 1438)    ED Course/ Medical Decision Making/ A&P  Medical Decision Making Amount and/or Complexity of Data Reviewed Labs: ordered. Radiology: ordered.   72 year old male presents to ED for evaluation.  Please see HPI for further details.  On examination patient is afebrile and nontachycardic.  His lung sounds are clear bilaterally, he is not hypoxic.  Abdomen is soft and compressible throughout.  Neurological examination at baseline.  He does have a wound to his left posterior lateral ankle that appears to be oozing purulent fluid.  Appears that wound is down to the bone possibly.  Concern for osteomyelitis here.  Will collect labs, lactic's has patient's tachycardic, will proceed with x-ray of left ankle.  CBC without leukocytosis or anemia.  Lactic acid at 1.7.  Ankle x-ray shows concerning findings for osteomyelitis.  Will proceed with MRI of left ankle, will start patient on  broad-spectrum antibiotics vancomycin and Zosyn.  Have consulted orthopedic surgery, PA-C Dale Dunn has agreed to see patient for consult.  Will most likely admit patient to hospitalist pending further workup.  At end of shift, patient workup not complete.  Patient signed out to oncoming provider Maxwell Marion, PA-C.  Plan of management discussed and oncoming provider.   Final Clinical Impression(s) / ED Diagnoses Final diagnoses:  Osteomyelitis of left ankle, unspecified type (HCC)  Rx / DC Orders ED Discharge Orders     None         Clent Ridges 06/19/23 1517    Lorre Nick, MD 06/20/23 [phone]

## 2023-06-19 NOTE — ED Notes (Signed)
Second attempt to give a verbal report to the rn on 2w  this is the second time a received a recording bed ready  all info in the chart

## 2023-06-19 NOTE — ED Notes (Signed)
The pt just received a bed assignment but he is still in mri  ortho pa has come looking for him also

## 2023-06-19 NOTE — ED Notes (Signed)
Ed provider reported to disconnect the iv fluid zosyn and vancomycin iv so the pt could go to mri  done

## 2023-06-19 NOTE — H&P (Signed)
History and Physical    Patient: Micheal Moore. GNF:[id number] DOB: [birth year] DOA: 06/19/2023 DOS: the patient was seen and examined on 06/19/2023 PCP: [name], MD  Patient coming from: Home  Chief Complaint:  Chief Complaint  Patient presents with   Wound Infection   HPI: Micheal Moore. is a 72 y.o. male with medical history significant of CAD s/p PCI April 2024, PAD, DM-2, chronic hypotension, recent hospitalization from 8/7-8/17 for polymicrobial bacteremia-discharged home on IV antibiotics presented to the hospital on 10/2 with worsening pain/discharge from his wound in his left ankle area.  Per patient-he has had a ulcer in this area of his posterior aspect of his left ankle for the past several months-he claims he developed it during his stay in rehab for hip fracture (bed was small-his legs were dangling out-and developed ulcers in bilateral posterior ankle areas-right also heel-left also worsened) .  Over the past several weeks-this has gradually worsened-but over the past few days-pain and discharge from the ulcer has markedly worsened.  He states the pain is intractable-10 out of 10-and reports foul-smelling discharge that is yellow color.  There is no history of fever. No history of nausea, vomiting, diarrhea, body aches or chills.  Per patient-he is usually nonambulatory at baseline since at least February 2024.  No headache No chest pain Much shortness of breath No abdominal pain No nausea, vomiting or diarrhea   Review of Systems: As mentioned in the history of present illness. All other systems reviewed and are negative. Past Medical History:  Diagnosis Date   Cancer (HCC)    right elbow melanoma   Diabetes mellitus without complication (HCC)    Type II   Dizziness    DVT (deep venous thrombosis) (HCC)    right leg   Frequent falls    Near syncope    Syncope    Past Surgical History:  Procedure Laterality Date   AMPUTATION TOE Left  04/28/2020   Procedure: AMPUTATION LEFT GREAT TOE;  Surgeon: Nadara Mustard, MD;  Location: [facility] OR;  Service: Orthopedics;  Laterality: Left;   CORONARY/GRAFT ACUTE MI REVASCULARIZATION N/A 01/15/2023   Procedure: Coronary/Graft Acute MI Revascularization;  Surgeon: Orbie Pyo, MD;  Location: [facility];  Service: Cardiovascular;  Laterality: N/A;   INTRAMEDULLARY (IM) NAIL INTERTROCHANTERIC Right 02/23/2022   Procedure: INTRAMEDULLARY (IM) NAIL INTERTROCHANTRIC, RIGHT;  Surgeon: Samson Frederic, MD;  Location: [facility];  Service: Orthopedics;  Laterality: Right;   LEFT HEART CATH AND CORONARY ANGIOGRAPHY N/A 01/15/2023   Procedure: LEFT HEART CATH AND CORONARY ANGIOGRAPHY;  Surgeon: Orbie Pyo, MD;  Location: [facility];  Service: Cardiovascular;  Laterality: N/A;   LOWER EXTREMITY ANGIOGRAPHY Bilateral 01/18/2023   Procedure: Lower Extremity Angiography;  Surgeon: Nada Libman, MD;  Location: [facility];  Service: Cardiovascular;  Laterality: Bilateral;   MELANOMA EXCISION WITH SENTINEL LYMPH NODE BIOPSY Right 08/19/2020   Procedure: WIDE LOCAL EXCISION RIGHT ELBOW MELANOMA WITH RIGHT AXILLARY SENTINEL LYMPH NODE BIOPSY;  Surgeon: Fritzi Mandes, MD;  Location: [facility];  Service: General;  Laterality: Right;  2nd incision in axilla   TEE WITHOUT CARDIOVERSION N/A 04/19/2023   Procedure: TRANSESOPHAGEAL ECHOCARDIOGRAM;  Surgeon: Little Ishikawa, MD;  Location: [facility];  Service: Cardiovascular;  Laterality: N/A;   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   Procedure: TOTAL HIP ARTHROPLASTY ANTERIOR APPROACH;  Surgeon: Samson Frederic, MD;  Location: [facility];  Service: Orthopedics;  Laterality: Left;  TOTAL HIP ARTHROPLASTY Left 08/24/2022   Procedure: OPEN REDUCTION, HEAD AND LINER EXCHANGE;  Surgeon: Samson Frederic, MD;  Location: [facility];  Service: Orthopedics;  Laterality: Left;   Social History:  reports that he has been smoking cigarettes. He has a  3.8 pack-year smoking history. He has never used smokeless tobacco. He reports that he does not currently use drugs. He reports that he does not drink alcohol.  No Known Allergies  Family History  Problem Relation Age of Onset   Stroke Mother    Diabetes Father    Stroke Sister    Diabetes Sister     Prior to Admission medications   Medication Sig Start Date End Date Taking? Authorizing Provider  acetaminophen (TYLENOL) 325 MG tablet Take 2 tablets (650 mg total) by mouth every 6 (six) hours as needed for mild pain, fever or headache. 04/22/23   Lonia Blood, MD  aspirin EC 81 MG tablet Take 1 tablet (81 mg total) by mouth daily. Swallow whole. 01/20/23   Lanae Boast, MD  barrier cream (NON-SPECIFIED) CREA Apply 1 Application topically 2 (two) times daily as needed. 05/04/23   Standley Brooking, MD  Blood Glucose Monitoring Suppl DEVI 1 each by Does not apply route 3 (three) times daily. May dispense any manufacturer covered by patient's insurance. 01/20/23   Lanae Boast, MD  BRILINTA 90 MG TABS tablet Take 90 mg by mouth 2 (two) times daily. 04/09/23   [provider]  Ensure Max Protein (ENSURE MAX PROTEIN) LIQD Take 330 mLs (11 oz total) by mouth 2 (two) times daily. Patient taking differently: Take 11 oz by mouth daily. 08/28/22   Lorin Glass, MD  fludrocortisone (FLORINEF) 0.1 MG tablet Take 2 tablets (0.2 mg total) by mouth daily. 03/05/22   Rolly Salter, MD  Glucagon, rDNA, (GLUCAGON EMERGENCY) 1 MG KIT Inject 1 mg into the skin as needed for up to 2 doses (Severe low blood sugar). 01/20/23   Lanae Boast, MD  Glucose Blood (BLOOD GLUCOSE TEST STRIPS) STRP 1 each by Does not apply route 3 (three) times daily. Use as directed to check blood sugar. May dispense any manufacturer covered by patient's insurance and fits patient's device. 01/20/23   Lanae Boast, MD  insulin aspart (NOVOLOG) 100 UNIT/ML FlexPen Inject 8 Units into the skin 3 (three) times daily with meals. Only take if  eating a meal AND Blood Glucose (BG) is 80 or higher. 04/22/23 07/21/23  Lonia Blood, MD  insulin detemir (LEVEMIR) 100 UNIT/ML FlexPen Inject 20 Units into the skin at bedtime. DO NOT TAKE if your blood sugars and consistently running < 120 04/23/23   Lonia Blood, MD  Insulin Pen Needle (PEN NEEDLES) 31G X 5 MM MISC 1 each by Does not apply route 3 (three) times daily. May dispense any manufacturer covered by patient's insurance. 01/20/23   Lanae Boast, MD  JARDIANCE 10 MG TABS tablet Take 10 mg by mouth daily. 04/09/23   [provider]  Lancet Device MISC 1 each by Does not apply route 3 (three) times daily. May dispense any manufacturer covered by patient's insurance. 01/20/23   Lanae Boast, MD  Lancets MISC 1 each by Does not apply route 3 (three) times daily. Use as directed to check blood sugar. May dispense any manufacturer covered by patient's insurance and fits patient's device. 01/20/23   Lanae Boast, MD  polyethylene glycol (MIRALAX / GLYCOLAX) 17 g packet Take 17 g by mouth daily. Patient taking differently:  Take 17 g by mouth daily as needed for mild constipation. 03/05/22   Rolly Salter, MD  rosuvastatin (CRESTOR) 5 MG tablet Take 5 mg by mouth 2 (two) times a week. 02/07/23   [provider]    Physical Exam: Vitals:   06/19/23 1246 06/19/23 1247  BP: 113/76   Pulse: (!) 106   Resp: 16   Temp: 98.8 F (37.1 C)   TempSrc: Oral   SpO2: 97%   Weight:  88.5 kg  Height:  6\' 5"  (1.956 m)   Gen Exam:Alert awake-not in any distress HEENT:atraumatic, normocephalic Chest: B/L clear to auscultation anteriorly CVS:S1S2 regular Abdomen:soft non tender, non distended Extremities:no edema-see picture below Neurology: Non focal Skin: no rash       Data Reviewed:    Latest Ref Rng & Units 06/19/2023    1:53 PM 05/01/2023    3:55 AM 04/24/2023   11:15 PM  CBC  WBC 4.0 - 10.5 K/uL 6.8  5.9  7.9   Hemoglobin 13.0 - 17.0 g/dL 16.1  09.6  04.5   Hematocrit 39.0  - 52.0 % 41.1  36.5  35.4   Platelets 150 - 400 K/uL 362  255  312      Oral contrast Omnipaque-9  1033ml,e    Latest Ref Rng & Units 05/01/2023    3:55 AM 04/26/2023    8:01 AM 04/24/2023   11:15 PM  BMP  Glucose 70 - 99 mg/dL 409  811  914   BUN 8 - 23 mg/dL 21  16  18    Creatinine 0.61 - 1.24 mg/dL 7.82  9.56  2.13   Sodium 135 - 145 mmol/L 137  137  138   Potassium 3.5 - 5.1 mmol/L 4.6  3.6  3.2   Chloride 98 - 111 mmol/L 104  101  102   CO2 22 - 32 mmol/L 26  25  23    Calcium 8.9 - 10.3 mg/dL 9.2  9.1  9.1     X-ray left ankle: Concerning for bony/acute osteomyelitis  Assessment and Plan: Left diabetic foot infection with probable osteomyelitis Await MRI Empirically cover with vancomycin/Rocephin/Flagyl for now Check inflammatory markers Orthopedics following-May need BKA if MRI does confirm osteomyelitis Follow blood cultures.  History of PAD Recent lower extremity angiogram May 2024-no significant femoropopliteal occlusive disease noted On dual antiplatelet/statin  CAD-s/p PCI to RCA April 2024 No anginal symptoms On aspirin/Brilinta/statin (suspect not on beta-blocker due to history of chronic hypotension) Will need to follow-up with cardiology-and see if he still needs to be on DAPT.  DM-2 Levemir 20 units/SSI/Jardiance Follow CBGs and optimize  History of orthostatic hypotension Florinef Watch K-electrolytes still pending  Chronic ambulatory dysfunction/nonambulatory/bedbound status Reviewed prior discharge summary from Dr. [place] [facility] complicated history of bilateral hip repairs-severe fear of falling and injuring himself-and apparently his chosen to be bedridden.  Per patient report-he has been bedbound since February 2024.   Advance Care Planning:   Code Status: Full Code   Consults: Ortho  Family Communication: None  Severity of Illness: The appropriate patient status for this patient is INPATIENT. Inpatient status is judged to be reasonable  and necessary in order to provide the required intensity of service to ensure the patient's safety. The patient's presenting symptoms, physical exam findings, and initial radiographic and laboratory data in the context of their chronic comorbidities is felt to place them at high risk for further clinical deterioration. Furthermore, it is not anticipated that the patient will be medically stable for  discharge from the hospital within 2 midnights of admission.   * I certify that at the point of admission it is my clinical judgment that the patient will require inpatient hospital care spanning beyond 2 midnights from the point of admission due to high intensity of service, high risk for further deterioration and high frequency of surveillance required.*  Author: Jeoffrey Massed, MD 06/19/2023 4:54 PM  For on call review [email or web address].

## 2023-06-19 NOTE — ED Provider Notes (Signed)
  Physical Exam  BP 113/76 (BP Location: Right Arm)   Pulse (!) 106   Temp 98.8 F (37.1 C) (Oral)   Resp 16   Ht 6\' 5"  (1.956 m)   Wt 88.5 kg   SpO2 97%   BMI 23.12 kg/m   Physical Exam  Procedures  Procedures  ED Course / MDM    Medical Decision Making Amount and/or Complexity of Data Reviewed Labs: ordered. Radiology: ordered.  Risk Prescription drug management. Decision regarding hospitalization.   Took over care for patient during shift change.  Patient has osteomyelitis of the left lateral ankle.  Orthopedics was consulted and they said that they would consult on patient's case if the hospitalist will admit.  I consulted hospitalist and they agreed to meet patient for further workup and management.   Maxwell Marion, PA-C 06/19/23 1542    Glyn Ade, MD 06/19/23 2318

## 2023-06-19 NOTE — ED Notes (Signed)
Some orders  are not verified

## 2023-06-19 NOTE — Progress Notes (Signed)
Pharmacy Antibiotic Note  Micheal Moore. is a 72 y.o. male admitted on 06/19/2023 with  osteomyelitis .  Pharmacy has been consulted for vancomycin dosing.  PMH foot osteomyelitis MRI confirmed osteomyelitis of left ankle 2g vancomycin IV x1 loading dose ordered and given prior to consult. WBC 6.8, LA 1.7, CRP 11.6, Scr 0.89 (BL~0.6)  Plan: Vancomycin 1500 mg IV every 12 hours >Goal trough 15-20 mcg/mL >Monitor CBC, renal function, cultures, and signs of clinical improvement >Deescalate when able  Height: 6\' 5"  (195.6 cm) Weight: 88.5 kg (195 lb) IBW/kg (Calculated) : 89.1  Temp (24hrs), Avg:98.8 F (37.1 C), Min:98.8 F (37.1 C), Max:98.8 F (37.1 C)  Recent Labs  Lab 06/19/23 1353 06/19/23 1417  WBC 6.8  --   LATICACIDVEN  --  1.7    CrCl cannot be calculated (Patient's most recent lab result is older than the maximum 21 days allowed.).    No Known Allergies  Antimicrobials this admission: 10/2 vancomycin  >>  10/2 ceftriaxone >>  10/2 flagyl>>   Microbiology results: 10/2 BCx: sent   Thank you for allowing pharmacy to be a part of this patient's care.  Stephenie Acres, PharmD PGY1 Pharmacy Resident 06/19/2023 4:31 PM

## 2023-06-19 NOTE — ED Notes (Signed)
Vancomycin restarted and the the zosyn restarted as the pt returned from [facility] bolus was not infused when he left for [facility]ri

## 2023-06-20 DIAGNOSIS — I252 Old myocardial infarction: Secondary | ICD-10-CM

## 2023-06-20 DIAGNOSIS — S81802A Unspecified open wound, left lower leg, initial encounter: Secondary | ICD-10-CM

## 2023-06-20 DIAGNOSIS — Z955 Presence of coronary angioplasty implant and graft: Secondary | ICD-10-CM

## 2023-06-20 DIAGNOSIS — M86162 Other acute osteomyelitis, left tibia and fibula: Secondary | ICD-10-CM

## 2023-06-20 DIAGNOSIS — I2510 Atherosclerotic heart disease of native coronary artery without angina pectoris: Secondary | ICD-10-CM | POA: Diagnosis not present

## 2023-06-20 DIAGNOSIS — Z8614 Personal history of Methicillin resistant Staphylococcus aureus infection: Secondary | ICD-10-CM

## 2023-06-20 DIAGNOSIS — M869 Osteomyelitis, unspecified: Secondary | ICD-10-CM | POA: Diagnosis not present

## 2023-06-20 LAB — COMPREHENSIVE METABOLIC PANEL
ALT: 12 U/L (ref 0–44)
AST: 12 U/L — ABNORMAL LOW (ref 15–41)
Albumin: 2.9 g/dL — ABNORMAL LOW (ref 3.5–5.0)
Alkaline Phosphatase: 61 U/L (ref 38–126)
Anion gap: 13 (ref 5–15)
BUN: 20 mg/dL (ref 8–23)
CO2: 22 mmol/L (ref 22–32)
Calcium: 8.9 mg/dL (ref 8.9–10.3)
Chloride: 95 mmol/L — ABNORMAL LOW (ref 98–111)
Creatinine, Ser: 0.92 mg/dL (ref 0.61–1.24)
GFR, Estimated: >60 mL/min (ref >60)
Glucose, Bld: 202 mg/dL — ABNORMAL HIGH (ref 70–99)
Potassium: 3.8 mmol/L (ref 3.5–5.1)
Sodium: 130 mmol/L — ABNORMAL LOW (ref 135–145)
Total Bilirubin: 0.4 mg/dL (ref 0.3–1.2)
Total Protein: 7.4 g/dL (ref 6.5–8.1)

## 2023-06-20 LAB — CBC
HCT: 39.6 % (ref 39.0–52.0)
Hemoglobin: 12.7 g/dL — ABNORMAL LOW (ref 13.0–17.0)
MCH: 29.1 pg (ref 26.0–34.0)
MCHC: 32.1 g/dL (ref 30.0–36.0)
MCV: 90.6 fL (ref 80.0–100.0)
Platelets: 346 10*3/uL (ref 150–400)
RBC: 4.37 MIL/uL (ref 4.22–5.81)
RDW: 13.8 % (ref 11.5–15.5)
WBC: 6.7 10*3/uL (ref 4.0–10.5)
nRBC: 0 % (ref 0.0–0.2)

## 2023-06-20 LAB — GLUCOSE, CAPILLARY
Glucose-Capillary: 170 mg/dL — ABNORMAL HIGH (ref 70–99)
Glucose-Capillary: 177 mg/dL — ABNORMAL HIGH (ref 70–99)
Glucose-Capillary: 178 mg/dL — ABNORMAL HIGH (ref 70–99)
Glucose-Capillary: 159 mg/dL — ABNORMAL HIGH (ref 70–99)

## 2023-06-20 LAB — PROTIME-INR
INR: 1.1 (ref 0.8–1.2)
Prothrombin Time: 14.1 s (ref 11.4–15.2)

## 2023-06-20 MED ORDER — GERHARDT'S BUTT CREAM
TOPICAL_CREAM | Freq: Two times a day (BID) | CUTANEOUS | Status: DC
  Filled 2023-06-20 (×3): qty 1

## 2023-06-20 NOTE — Progress Notes (Signed)
PROGRESS NOTE    Micheal Moore.  ZOX:[id number] DOB: 1951/07/01 DOA: 06/19/2023 PCP: [name], MD  Chief Complaint  Patient presents with   Wound Infection    Brief Narrative:   Micheal Moore. is Micheal Moore 72 y.o. male with medical history significant of CAD s/p PCI April 2024, PAD, DM-2, chronic hypotension, recent hospitalization from 8/7-8/17 for polymicrobial bacteremia-discharged home on IV antibiotics presented to the hospital on 10/2 with worsening pain/discharge from his wound in his left ankle area.  Per patient-he has had [name] ulcer in this area of his posterior aspect of his left ankle for the past several months-he claims he developed it during his stay in rehab for hip fracture (bed was small-his legs were dangling out-and developed ulcers in bilateral posterior ankle areas-right also heel-left also worsened) .  Over the past several weeks-this has gradually worsened-but over the past few days-pain and discharge from the ulcer has markedly worsened.  He states the pain is intractable-10 out of 10-and reports foul-smelling discharge that is yellow color.  There is no history of fever. No history of nausea, vomiting, diarrhea, body aches or chills.  Per patient-he is usually nonambulatory at baseline since at least February 2024.    Assessment & Plan:   Principal Problem:   Osteomyelitis (HCC) Active Problems:   CAD (coronary artery disease)   Type 2 diabetes mellitus with hyperlipidemia (HCC)   PVD (peripheral vascular disease) (HCC)   Hyperlipidemia   Ambulatory dysfunction  Left diabetic foot infection Osteomyelitis  MRI with osteomyelitis of the fibular shaft and distal metaphysis with surrounding myositis - endosteal edema in distal tibia - abnormal edema and mild enhancement posteriorly in the calcaneus suspicious for early osteo (see report) Appreciate ortho recs -> no immediate surgical indication, recommending ID consult, wet to dry dressing  changes ABI pending ID consult  Continue broad spectrum antibiotics    History of PAD Recent lower extremity angiogram May 2024-no significant femoropopliteal occlusive disease noted On dual antiplatelet/statin   CAD-s/p PCI to RCA April 2024 No anginal symptoms On aspirin/Brilinta/statin (suspect not on beta-blocker due to history of chronic hypotension) Appreciate cardiology assistance -> should remain on DAPT for 1 year unless plan for surgery (brilinta can be held prior to surgery if needed - 5 days prior and restarted ASAP post operatively.  Aspirin should be continued indefinitely and throughout perioperative period.  See 10/3 cardiology note.   DM-2 Levemir 20 units/SSI/Jardiance Follow CBGs and optimize   History of orthostatic hypotension Florinef Watch K-electrolytes still pending   Chronic ambulatory dysfunction/nonambulatory/bedbound status Reviewed prior discharge summary from Dr. [place] Service complicated history of bilateral hip repairs-severe fear of falling and injuring himself-and apparently his chosen to be bedridden.  Per patient report-he has been bedbound since February 2024.    DVT prophylaxis: lovenox Code Status: full Family Communication: none Disposition:   Status is: Inpatient Remains inpatient appropriate because: need for ortho, ID consults   Consultants:  Ortho ID  Procedures:  none  Antimicrobials:  Anti-infectives (From admission, onward)    Start     Dose/Rate Route Frequency Ordered Stop   06/20/23 0300  vancomycin (VANCOREADY) IVPB 1500 mg/300 mL        1,500 mg 150 mL/hr over 120 Minutes Intravenous Every 12 hours 06/19/23 2040     06/19/23 1615  cefTRIAXone (ROCEPHIN) 2 g in sodium chloride 0.9 % 100 mL IVPB        2 g 200 mL/hr over 30 Minutes Intravenous Every  24 hours 06/19/23 1608     06/19/23 1615  metroNIDAZOLE (FLAGYL) IVPB 500 mg        500 mg 100 mL/hr over 60 Minutes Intravenous Every 12 hours 06/19/23 1608      06/19/23 1430  vancomycin (VANCOCIN) IVPB 1000 mg/200 mL premix  Status:  Discontinued        1,000 mg 200 mL/hr over 60 Minutes Intravenous NOW 06/19/23 1417 06/19/23 1426   06/19/23 1430  piperacillin-tazobactam (ZOSYN) IVPB 3.375 g        3.375 g 12.5 mL/hr over 240 Minutes Intravenous NOW 06/19/23 1417 06/19/23 1520   06/19/23 1430  vancomycin (VANCOCIN) IVPB 1000 mg/200 mL premix  Status:  Discontinued       Placed in "And" Linked Group   1,000 mg 200 mL/hr over 60 Minutes Intravenous  Once 06/19/23 1426 06/19/23 2040   06/19/23 1430  vancomycin (VANCOCIN) IVPB 1000 mg/200 mL premix       Placed in "And" Linked Group   1,000 mg 200 mL/hr over 60 Minutes Intravenous  Once 06/19/23 1426 06/19/23 1539       Subjective: No new complaints  Objective: Vitals:   06/19/23 1940 06/19/23 2315 06/20/23 0323 06/20/23 0738  BP: 92/61 107/74 133/86 (!) 131/58  Pulse: 99 96 (!) 109 94  Resp:      Temp: 98.3 F (36.8 C) 98 F (36.7 C) 98.4 F (36.9 C) 98.3 F (36.8 C)  TempSrc: Oral Oral Oral Oral  SpO2: 97% 97% 97% 100%  Weight:      Height:        Intake/Output Summary (Last 24 hours) at 06/20/2023 1440 Last data filed at 06/20/2023 0857 Gross per 24 hour  Intake 260.38 ml  Output 1700 ml  Net -1439.62 ml   Filed Weights   06/19/23 1247  Weight: 88.5 kg    Examination:  General exam: Appears calm and comfortable  Respiratory system: unlabored Cardiovascular system: RRR Central nervous system: Alert  Extremities: LLE wound    Data Reviewed: I have personally reviewed following labs and imaging studies  CBC: Recent Labs  Lab 06/19/23 1353 06/19/23 1759 06/20/23 0624  WBC 6.8 6.8 6.7  HGB 13.1 12.7* 12.7*  HCT 41.1 40.0 39.6  MCV 91.9 92.0 90.6  PLT 362 377 346    Basic Metabolic Panel: Recent Labs  Lab 06/19/23 1803 06/20/23 0624  NA 134* 130*  K 4.0 3.8  CL 98 95*  CO2 22 22  GLUCOSE 149* 202*  BUN 18 20  CREATININE 0.89 0.92  CALCIUM 9.0  8.9    GFR: Estimated Creatinine Clearance: 90.9 mL/min (by C-G formula based on SCr of 0.92 mg/dL).  Liver Function Tests: Recent Labs  Lab 06/20/23 0624  AST 12*  ALT 12  ALKPHOS 61  BILITOT 0.4  PROT 7.4  ALBUMIN 2.9*    CBG: Recent Labs  Lab 06/19/23 1842 06/19/23 1942 06/20/23 0740 06/20/23 1145  GLUCAP 153* 238* 170* 177*     Recent Results (from the past 240 hour(s))  Blood culture (routine x 2)     Status: None (Preliminary result)   Collection Time: 06/19/23  1:12 PM   Specimen: BLOOD  Result Value Ref Range Status   Specimen Description BLOOD RIGHT ANTECUBITAL  Final   Special Requests   Final    BOTTLES DRAWN AEROBIC ONLY Blood Culture adequate volume   Culture   Final    NO GROWTH < 24 HOURS Performed at [facility], [address] [place], [place] [address]    Report Status PENDING  Incomplete  Blood culture (routine x 2)     Status: None (Preliminary result)   Collection Time: 06/19/23  1:53 PM   Specimen: BLOOD LEFT ARM  Result Value Ref Range Status   Specimen Description BLOOD LEFT ARM  Final   Special Requests   Final    BOTTLES DRAWN AEROBIC AND ANAEROBIC Blood Culture results may not be optimal due to an inadequate volume of blood received in culture bottles   Culture   Final    NO GROWTH < 24 HOURS Performed at [facility], [address], [place], [place] [address]    Report Status PENDING  Incomplete         Radiology Studies: MR ANKLE LEFT W WO CONTRAST  Result Date: 06/19/2023 CLINICAL DATA:  Osteomyelitis.  Left posterolateral ankle wound. EXAM: MRI OF THE LEFT ANKLE WITHOUT AND WITH CONTRAST TECHNIQUE: Multiplanar, multisequence MR imaging of the ankle was performed before and after the administration of intravenous contrast. CONTRAST:  9mL GADAVIST GADOBUTROL 1 MMOL/ML IV SOLN COMPARISON:  Radiographs 06/19/2023 FINDINGS: TENDONS Peroneal: Unremarkable Posteromedial: Distal tibialis posterior tendinopathy.  Anterior: Unremarkable Achilles: Unremarkable Plantar Fascia: Mildly accentuated signal in the medial band of the plantar fascia for example on image 30 series 8 compatible with plantar fasciitis. LIGAMENTS Lateral: Thin/attenuated anterior talofibular ligament likely from Mckaylin Bastien remote injury. Medial: Unremarkable CARTILAGE Ankle Joint: Degenerative thinning of the articular cartilage. Subtalar Joints/Sinus Tarsi: Chondral thinning medially in the posterior subtalar joint. Bones: There is active osteomyelitis of the fibular diaphysis extending to the distal metaphysis. Centered about 12.7 cm proximal to the distal fibular tip, there is Gershom Brobeck large posterolateral ulceration of the cutaneous and subcutaneous tissues with the ulcer crater extending to the periosteal margin of the fibula (image 14, series 7) and potentially exposing the bony surface of the fibula. In addition to abnormal edema and enhancement throughout the fibular shaft and distal metaphysis, there is bony demineralization and thinning of the cortex tangential to this ulceration, and surrounding inflammatory edema extending in adjacent muscle groups including the extensor digitorum, peroneus, and flexor hallucis longus muscle groups. This likely represents myositis adjacent to the osteomyelitis. I do not see Brittainy Bucker drainable abscess in the soft tissues. There is some endosteal edema in the distal tibia, probably reactive in this context rather than representing concomitant osteomyelitis. Posteriorly in the calcaneus as on image 10 of series 4 there is abnormal edema and mild enhancement as on image 35 series 11 suspicious for early osteomyelitis. Dorsal spurring at the talonavicular joint. Other: No supplemental non-categorized findings. IMPRESSION: IMPRESSION 1. Large posterolateral ulceration of the lower leg cutaneous and subcutaneous tissues with the ulcer crater extending to the periosteal margin of the fibular diaphysis and potentially exposing the bony  surface of the fibula. There is active osteomyelitis of the fibular shaft and distal metaphysis, with surrounding myositis. 2. There is some endosteal edema in the distal tibia, probably reactive in this context rather than representing concomitant osteomyelitis. 3. There is abnormal edema and mild enhancement posteriorly in the calcaneus suspicious for early osteomyelitis. 4. Distal tibialis posterior tendinopathy. 5. Mild plantar fasciitis. 6. Thin/attenuated anterior talofibular ligament likely from Jahira Swiss remote injury. 7. Degenerative thinning of the articular cartilage in the ankle and posterior subtalar joint. 8. Dorsal spurring at the talonavicular joint. Electronically Signed   By: Gaylyn Rong M.D.   On: 06/19/2023 16:56   DG Ankle Complete Left  Result  Date: 06/19/2023 CLINICAL DATA:  Posterolateral left ankle diabetic wound. Evaluate for free air or gas. EXAM: LEFT ANKLE COMPLETE - 3+ VIEW COMPARISON:  Left foot radiographs 04/12/2023 FINDINGS: There is diffuse decreased bone mineralization. There is horizontal linear sclerosis within the expected region of the mid to lateral aspect of the distal tibial physis, similar to prior 04/12/2023 lateral foot radiograph appearing chronic. It could represent [place] Cairns subacute to older prior stress fracture. There is lucency and apparent concavity of the posterior soft tissues of the distal calf, centered approximately 10 cm proximal to the posterosuperior aspect of the calcaneus. There is longitudinal linear lucency at the posterior cortex of the adjacent fibula along an approximate 4 cm craniocaudal length on lateral view, suspicious for bone erosion. There is relative lucency seen in this region of the distal fibular diaphysis on transverse view. Which Moderate distal fibula and adjacent lateral talar process degenerative spurring. Mild to moderate posterior and mild medial tibiotalar joint space narrowing. Moderate anterior and posterior tibial plafond  degenerative osteophytes. Mild-to-moderate joint space narrowing and subchondral sclerosis throughout the midfoot. Mild-to-moderate plantar and mild posterior calcaneal heel spurs. No subcutaneous emphysema is seen. IMPRESSION: 1. Apparent soft tissue wound of the posterior distal calf, centered approximately 10 cm proximal to the posterosuperior aspect of the calcaneus. There is longitudinal linear lucency at the posterior cortex of the adjacent fibular diaphysis along an approximate 4 cm craniocaudal length concerning for bone erosion and acute osteomyelitis. 2. There is horizontal linear sclerosis within the expected region of the mid to lateral aspect of the distal tibial physis, similar to prior 04/12/2023 lateral foot radiograph appearing chronic. It could represent Jeryn Bertoni subacute to older prior stress fracture. 3. Moderate tibiotalar and midfoot osteoarthritis. Electronically Signed   By: Neita Garnet M.D.   On: 06/19/2023 14:07        Scheduled Meds:  aspirin EC  81 mg Oral Daily   empagliflozin  10 mg Oral Daily   enoxaparin (LOVENOX) injection  40 mg Subcutaneous Q24H   fludrocortisone  0.2 mg Oral Daily   insulin aspart  0-9 Units Subcutaneous TID WC   insulin detemir  20 Units Subcutaneous QHS   polyethylene glycol  17 g Oral Daily   rosuvastatin  5 mg Oral Once per day on Monday Thursday   senna  1 tablet Oral BID   sodium chloride flush  3 mL Intravenous Q12H   ticagrelor  90 mg Oral BID   Continuous Infusions:  sodium chloride     cefTRIAXone (ROCEPHIN)  IV 2 g (06/19/23 1806)   metronidazole 500 mg (06/20/23 0414)   vancomycin 1,500 mg (06/20/23 0406)     LOS: 1 day    Time spent: over 30 min    Lacretia Nicks, MD [facility]   To contact the attending provider between 7A-7P or the covering provider during after hours 7P-7A, please log into the web site [email or web address] and access using universal [place] password for that web site. If you do not have the  password, please call the hospital operator.  06/20/2023, 2:40 PM

## 2023-06-20 NOTE — Plan of Care (Signed)
Problem: Education: Goal: Understanding of CV disease, CV risk reduction, and recovery process will improve Outcome: Progressing Goal: Individualized Educational Video(s) Outcome: Progressing   Problem: Activity: Goal: Ability to return to baseline activity level will improve Outcome: Progressing   Problem: Cardiovascular: Goal: Ability to achieve and maintain adequate cardiovascular perfusion will improve Outcome: Progressing Goal: Vascular access site(s) Level 0-1 will be maintained Outcome: Progressing   Problem: Health Behavior/Discharge Planning: Goal: Ability to safely manage health-related needs after discharge will improve Outcome: Progressing   Problem: Education: Goal: Ability to describe self-care measures that may prevent or decrease complications (Diabetes Survival Skills Education) will improve Outcome: Progressing Goal: Individualized Educational Video(s) Outcome: Progressing   Problem: Cardiac: Goal: Ability to maintain an adequate cardiac output will improve Outcome: Progressing   Problem: Health Behavior/Discharge Planning: Goal: Ability to identify and utilize available resources and services will improve Outcome: Progressing Goal: Ability to manage health-related needs will improve Outcome: Progressing   Problem: Fluid Volume: Goal: Ability to achieve a balanced intake and output will improve Outcome: Progressing   Problem: Metabolic: Goal: Ability to maintain appropriate glucose levels will improve Outcome: Progressing   Problem: Nutritional: Goal: Maintenance of adequate nutrition will improve Outcome: Progressing Goal: Maintenance of adequate weight for body size and type will improve Outcome: Progressing   Problem: Respiratory: Goal: Will regain and/or maintain adequate ventilation Outcome: Progressing   Problem: Urinary Elimination: Goal: Ability to achieve and maintain adequate renal perfusion and functioning will improve Outcome:  Progressing   Problem: Education: Goal: Knowledge of the prescribed therapeutic regimen will improve Outcome: Progressing Goal: Understanding of discharge needs will improve Outcome: Progressing Goal: Individualized Educational Video(s) Outcome: Progressing   Problem: Activity: Goal: Ability to avoid complications of mobility impairment will improve Outcome: Progressing Goal: Ability to tolerate increased activity will improve Outcome: Progressing   Problem: Clinical Measurements: Goal: Postoperative complications will be avoided or minimized Outcome: Progressing   Problem: Pain Management: Goal: Pain level will decrease with appropriate interventions Outcome: Progressing   Problem: Skin Integrity: Goal: Will show signs of wound healing Outcome: Progressing   Problem: Education: Goal: Ability to describe self-care measures that may prevent or decrease complications (Diabetes Survival Skills Education) will improve Outcome: Progressing Goal: Individualized Educational Video(s) Outcome: Progressing   Problem: Coping: Goal: Ability to adjust to condition or change in health will improve Outcome: Progressing   Problem: Fluid Volume: Goal: Ability to maintain a balanced intake and output will improve Outcome: Progressing   Problem: Health Behavior/Discharge Planning: Goal: Ability to identify and utilize available resources and services will improve Outcome: Progressing Goal: Ability to manage health-related needs will improve Outcome: Progressing   Problem: Metabolic: Goal: Ability to maintain appropriate glucose levels will improve Outcome: Progressing   Problem: Nutritional: Goal: Maintenance of adequate nutrition will improve Outcome: Progressing Goal: Progress toward achieving an optimal weight will improve Outcome: Progressing   Problem: Skin Integrity: Goal: Risk for impaired skin integrity will decrease Outcome: Progressing   Problem: Tissue  Perfusion: Goal: Adequacy of tissue perfusion will improve Outcome: Progressing   Problem: Education: Goal: Knowledge of General Education information will improve Description: Including pain rating scale, medication(s)/side effects and non-pharmacologic comfort measures Outcome: Progressing   Problem: Health Behavior/Discharge Planning: Goal: Ability to manage health-related needs will improve Outcome: Progressing   Problem: Clinical Measurements: Goal: Ability to maintain clinical measurements within normal limits will improve Outcome: Progressing Goal: Will remain free from infection Outcome: Progressing Goal: Diagnostic test results will improve Outcome: Progressing Goal: Respiratory complications will  improve Outcome: Progressing Goal: Cardiovascular complication will be avoided Outcome: Progressing   Problem: Activity: Goal: Risk for activity intolerance will decrease Outcome: Progressing   Problem: Nutrition: Goal: Adequate nutrition will be maintained Outcome: Progressing

## 2023-06-20 NOTE — Consult Note (Signed)
Cardiology Consultation   Patient ID: Micheal Moore. MRN: [id number]; DOB: 1951/06/24  Admit date: 06/19/2023 Date of Consult: 06/20/2023  PCP:  [name], MD   [facility] Providers Cardiologist:  Dietrich Pates, MD        Patient Profile:   Micheal Moore. is a 72 y.o. male with a hx of CAD with inferior STEMI 12/2022 s/p PCI, type II diabetes, PAD, chronic hypotension, recent bacteremia who is being seen 06/20/2023 for the evaluation of management of dual antiplatelet therapy at the request of Dr. Lowell Guitar.  History of Present Illness:   [name] was admitted 06/19/23 with worsening pain and discharge from left lower extremity wound. He has now been found to have osteomyelitis. Cardiology asked to see to manage dual antiplatelet therapy in the setting of an RCA stent for inferior MI on 01/15/23.  Patient is nonambulatory. He denies any chest pain. He initially did not remember that he had a heart attack and stent earlier this year, but on discussion he did recall this. Discussed medications for his heart, he was unaware that he was taking anything. Denies blood in stool or urine. Does note that he does not think he has had a bowel movement in about a week.  Past Medical History:  Diagnosis Date   Cancer (HCC)    right elbow melanoma   Diabetes mellitus without complication (HCC)    Type II   Dizziness    DVT (deep venous thrombosis) (HCC)    right leg   Frequent falls    Near syncope    Syncope     Past Surgical History:  Procedure Laterality Date   AMPUTATION TOE Left 04/28/2020   Procedure: AMPUTATION LEFT GREAT TOE;  Surgeon: Nadara Mustard, MD;  Location: [facility] OR;  Service: Orthopedics;  Laterality: Left;   CORONARY/GRAFT ACUTE MI REVASCULARIZATION N/A 01/15/2023   Procedure: Coronary/Graft Acute MI Revascularization;  Surgeon: Orbie Pyo, MD;  Location: [facility] INVASIVE CV LAB;  Service: Cardiovascular;  Laterality: N/A;    INTRAMEDULLARY (IM) NAIL INTERTROCHANTERIC Right 02/23/2022   Procedure: INTRAMEDULLARY (IM) NAIL INTERTROCHANTRIC, RIGHT;  Surgeon: Samson Frederic, MD;  Location: [facility] ORS;  Service: Orthopedics;  Laterality: Right;   LEFT HEART CATH AND CORONARY ANGIOGRAPHY N/A 01/15/2023   Procedure: LEFT HEART CATH AND CORONARY ANGIOGRAPHY;  Surgeon: Orbie Pyo, MD;  Location: [facility];  Service: Cardiovascular;  Laterality: N/A;   LOWER EXTREMITY ANGIOGRAPHY Bilateral 01/18/2023   Procedure: Lower Extremity Angiography;  Surgeon: Nada Libman, MD;  Location: [facility];  Service: Cardiovascular;  Laterality: Bilateral;   MELANOMA EXCISION WITH SENTINEL LYMPH NODE BIOPSY Right 08/19/2020   Procedure: WIDE LOCAL EXCISION RIGHT ELBOW MELANOMA WITH RIGHT AXILLARY SENTINEL LYMPH NODE BIOPSY;  Surgeon: Fritzi Mandes, MD;  Location: [facility] OR;  Service: General;  Laterality: Right;  2nd incision in axilla   TEE WITHOUT CARDIOVERSION N/A 04/19/2023   Procedure: TRANSESOPHAGEAL ECHOCARDIOGRAM;  Surgeon: Little Ishikawa, MD;  Location: [facility] INVASIVE CV LAB;  Service: Cardiovascular;  Laterality: N/A;   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   Procedure: TOTAL HIP ARTHROPLASTY ANTERIOR APPROACH;  Surgeon: Samson Frederic, MD;  Location: [facility];  Service: Orthopedics;  Laterality: Left;   TOTAL HIP ARTHROPLASTY Left 08/24/2022   Procedure: OPEN REDUCTION, HEAD AND LINER EXCHANGE;  Surgeon: Samson Frederic, MD;  Location: [facility];  Service: Orthopedics;  Laterality: Left;     Home Medications:  Prior to Admission medications  Medication Sig Start Date End Date Taking? Authorizing Provider  acetaminophen (TYLENOL) 325 MG tablet Take 2 tablets (650 mg total) by mouth every 6 (six) hours as needed for mild pain, fever or headache. 04/22/23  Yes Lonia Blood, MD  aspirin EC 81 MG tablet Take 1 tablet (81 mg total) by mouth daily. Swallow whole. 01/20/23  Yes Lanae Boast, MD  Ensure Max Protein (ENSURE  MAX PROTEIN) LIQD Take 330 mLs (11 oz total) by mouth 2 (two) times daily. Patient taking differently: Take 11 oz by mouth daily. 08/28/22  Yes Dahal, Melina Schools, MD  insulin aspart (NOVOLOG) 100 UNIT/ML FlexPen Inject 8 Units into the skin 3 (three) times daily with meals. Only take if eating a meal AND Blood Glucose (BG) is 80 or higher. 04/22/23 07/21/23 Yes Lonia Blood, MD  insulin detemir (LEVEMIR) 100 UNIT/ML FlexPen Inject 20 Units into the skin at bedtime. DO NOT TAKE if your blood sugars and consistently running < 120 04/23/23  Yes McClung, Elpidio Eric, MD  JARDIANCE 10 MG TABS tablet Take 10 mg by mouth daily. 04/09/23  Yes [provider]  polyethylene glycol (MIRALAX / GLYCOLAX) 17 g packet Take 17 g by mouth daily. Patient taking differently: Take 17 g by mouth daily as needed for mild constipation. 03/05/22  Yes Rolly Salter, MD  rosuvastatin (CRESTOR) 5 MG tablet Take 5 mg by mouth 2 (two) times a week. 02/07/23  Yes [provider]  barrier cream (NON-SPECIFIED) CREA Apply 1 Application topically 2 (two) times daily as needed. Patient not taking: Reported on 06/19/2023 05/04/23   Standley Brooking, MD  Blood Glucose Monitoring Suppl DEVI 1 each by Does not apply route 3 (three) times daily. May dispense any manufacturer covered by patient's insurance. 01/20/23   Lanae Boast, MD  BRILINTA 90 MG TABS tablet Take 90 mg by mouth 2 (two) times daily. Patient not taking: Reported on 06/19/2023 04/09/23   [provider]  Glucagon, rDNA, (GLUCAGON EMERGENCY) 1 MG KIT Inject 1 mg into the skin as needed for up to 2 doses (Severe low blood sugar). Patient not taking: Reported on 06/19/2023 01/20/23   Lanae Boast, MD  Glucose Blood (BLOOD GLUCOSE TEST STRIPS) STRP 1 each by Does not apply route 3 (three) times daily. Use as directed to check blood sugar. May dispense any manufacturer covered by patient's insurance and fits patient's device. 01/20/23   Lanae Boast, MD  Insulin Pen  Needle (PEN NEEDLES) 31G X 5 MM MISC 1 each by Does not apply route 3 (three) times daily. May dispense any manufacturer covered by patient's insurance. 01/20/23   Lanae Boast, MD  Lancet Device MISC 1 each by Does not apply route 3 (three) times daily. May dispense any manufacturer covered by patient's insurance. 01/20/23   Lanae Boast, MD  Lancets MISC 1 each by Does not apply route 3 (three) times daily. Use as directed to check blood sugar. May dispense any manufacturer covered by patient's insurance and fits patient's device. 01/20/23   Lanae Boast, MD    Inpatient Medications: Scheduled Meds:  aspirin EC  81 mg Oral Daily   empagliflozin  10 mg Oral Daily   enoxaparin (LOVENOX) injection  40 mg Subcutaneous Q24H   fludrocortisone  0.2 mg Oral Daily   insulin aspart  0-9 Units Subcutaneous TID WC   insulin detemir  20 Units Subcutaneous QHS   polyethylene glycol  17 g Oral Daily   rosuvastatin  5 mg Oral Once  per day on Monday Thursday   senna  1 tablet Oral BID   sodium chloride flush  3 mL Intravenous Q12H   ticagrelor  90 mg Oral BID   Continuous Infusions:  sodium chloride     cefTRIAXone (ROCEPHIN)  IV 2 g (06/19/23 1806)   metronidazole 500 mg (06/20/23 0414)   vancomycin 1,500 mg (06/20/23 0406)   PRN Meds: sodium chloride, acetaminophen **OR** acetaminophen, albuterol, bisacodyl, fentaNYL (SUBLIMAZE) injection, ondansetron **OR** ondansetron (ZOFRAN) IV, oxyCODONE, sodium chloride flush  Allergies:   No Known Allergies  Social History:   Social History   Socioeconomic History   Marital status: Married    Spouse name: [name]   Number of children: 1   Years of education: Not on file   Highest education level: Associate degree: occupational, Scientist, product/process development, or vocational program  Occupational History    Comment: retired  Tobacco Use   Smoking status: Every Day    Current packs/day: 0.20    Average packs/day: 0.2 packs/day for 19.0 years (3.8 ttl pk-yrs)    Types: Cigarettes    Smokeless tobacco: Never  Vaping Use   Vaping status: Never Used  Substance and Sexual Activity   Alcohol use: No   Drug use: Not Currently   Sexual activity: Not on file  Other Topics Concern   Not on file  Social History Narrative   Lives with wife   Social Determinants of Health   Financial Resource Strain: Not on file  Food Insecurity: No Food Insecurity (04/24/2023)   Hunger Vital Sign    Worried About Running Out of Food in the Last Year: Never true    Ran Out of Food in the Last Year: Never true  Transportation Needs: No Transportation Needs (04/24/2023)   PRAPARE - Administrator, Civil Service (Medical): No    Lack of Transportation (Non-Medical): No  Physical Activity: Not on file  Stress: Not on file  Social Connections: Not on file  Intimate Partner Violence: Not At Risk (04/24/2023)   Humiliation, Afraid, Rape, and Kick questionnaire    Fear of Current or Ex-Partner: No    Emotionally Abused: No    Physically Abused: No    Sexually Abused: No    Family History:    Family History  Problem Relation Age of Onset   Stroke Mother    Diabetes Father    Stroke Sister    Diabetes Sister      ROS:  Please see the history of present illness.  All other ROS reviewed and negative.     Physical Exam/Data:   Vitals:   06/19/23 1940 06/19/23 2315 06/20/23 0323 06/20/23 0738  BP: 92/61 107/74 133/86 (!) 131/58  Pulse: 99 96 (!) 109 94  Resp:      Temp: 98.3 F (36.8 C) 98 F (36.7 C) 98.4 F (36.9 C) 98.3 F (36.8 C)  TempSrc: Oral Oral Oral Oral  SpO2: 97% 97% 97% 100%  Weight:      Height:        Intake/Output Summary (Last 24 hours) at 06/20/2023 1453 Last data filed at 06/20/2023 0857 Gross per 24 hour  Intake 260.38 ml  Output 1700 ml  Net -1439.62 ml      06/19/2023   12:47 PM 04/24/2023   10:15 AM 04/19/2023    1:06 PM  Last 3 Weights  Weight (lbs) 195 lb 190 lb 188 lb 4.4 oz  Weight (kg) 88.451 kg 86.183 kg 85.4 kg  Body mass  index is 23.12 kg/m.  General:  Well nourished, well developed, in no acute distress HEENT: normal Neck: no JVD Vascular: No carotid bruits; Distal pulses 2+ bilaterally Cardiac:  normal S1, S2; RRR; no murmur  Lungs:  clear to auscultation bilaterally, no wheezing, rhonchi or rales  Abd: soft, nontender, no hepatomegaly  Lower extremties: see images in surgical notes. Erythema and wound on L lower extremity with RLE mild discoloration Psych:  Normal affect   Relevant CV Studies: No new  Laboratory Data:  High Sensitivity Troponin:  No results for input(s): "TROPONINIHS" in the last 720 hours.   Chemistry Recent Labs  Lab 06/19/23 1803 06/20/23 0624  NA 134* 130*  K 4.0 3.8  CL 98 95*  CO2 22 22  GLUCOSE 149* 202*  BUN 18 20  CREATININE 0.89 0.92  CALCIUM 9.0 8.9  GFRNONAA >60 >60  ANIONGAP 14 13    Recent Labs  Lab 06/20/23 0624  PROT 7.4  ALBUMIN 2.9*  AST 12*  ALT 12  ALKPHOS 61  BILITOT 0.4   Lipids No results for input(s): "CHOL", "TRIG", "HDL", "LABVLDL", "LDLCALC", "CHOLHDL" in the last 168 hours.  Hematology Recent Labs  Lab 06/19/23 1353 06/19/23 1759 06/20/23 0624  WBC 6.8 6.8 6.7  RBC 4.47 4.35 4.37  HGB 13.1 12.7* 12.7*  HCT 41.1 40.0 39.6  MCV 91.9 92.0 90.6  MCH 29.3 29.2 29.1  MCHC 31.9 31.8 32.1  RDW 13.8 13.8 13.8  PLT 362 377 346   Thyroid No results for input(s): "TSH", "FREET4" in the last 168 hours.  BNPNo results for input(s): "BNP", "PROBNP" in the last 168 hours.  DDimer No results for input(s): "DDIMER" in the last 168 hours.   Radiology/Studies:  MR ANKLE LEFT W WO CONTRAST  Result Date: 06/19/2023 CLINICAL DATA:  Osteomyelitis.  Left posterolateral ankle wound. EXAM: MRI OF THE LEFT ANKLE WITHOUT AND WITH CONTRAST TECHNIQUE: Multiplanar, multisequence MR imaging of the ankle was performed before and after the administration of intravenous contrast. CONTRAST:  9mL GADAVIST GADOBUTROL 1 MMOL/ML IV SOLN COMPARISON:   Radiographs 06/19/2023 FINDINGS: TENDONS Peroneal: Unremarkable Posteromedial: Distal tibialis posterior tendinopathy. Anterior: Unremarkable Achilles: Unremarkable Plantar Fascia: Mildly accentuated signal in the medial band of the plantar fascia for example on image 30 series 8 compatible with plantar fasciitis. LIGAMENTS Lateral: Thin/attenuated anterior talofibular ligament likely from a remote injury. Medial: Unremarkable CARTILAGE Ankle Joint: Degenerative thinning of the articular cartilage. Subtalar Joints/Sinus Tarsi: Chondral thinning medially in the posterior subtalar joint. Bones: There is active osteomyelitis of the fibular diaphysis extending to the distal metaphysis. Centered about 12.7 cm proximal to the distal fibular tip, there is a large posterolateral ulceration of the cutaneous and subcutaneous tissues with the ulcer crater extending to the periosteal margin of the fibula (image 14, series 7) and potentially exposing the bony surface of the fibula. In addition to abnormal edema and enhancement throughout the fibular shaft and distal metaphysis, there is bony demineralization and thinning of the cortex tangential to this ulceration, and surrounding inflammatory edema extending in adjacent muscle groups including the extensor digitorum, peroneus, and flexor hallucis longus muscle groups. This likely represents myositis adjacent to the osteomyelitis. I do not see a drainable abscess in the soft tissues. There is some endosteal edema in the distal tibia, probably reactive in this context rather than representing concomitant osteomyelitis. Posteriorly in the calcaneus as on image 10 of series 4 there is abnormal edema and mild enhancement as on image 35 series  11 suspicious for early osteomyelitis. Dorsal spurring at the talonavicular joint. Other: No supplemental non-categorized findings. IMPRESSION: IMPRESSION 1. Large posterolateral ulceration of the lower leg cutaneous and subcutaneous tissues  with the ulcer crater extending to the periosteal margin of the fibular diaphysis and potentially exposing the bony surface of the fibula. There is active osteomyelitis of the fibular shaft and distal metaphysis, with surrounding myositis. 2. There is some endosteal edema in the distal tibia, probably reactive in this context rather than representing concomitant osteomyelitis. 3. There is abnormal edema and mild enhancement posteriorly in the calcaneus suspicious for early osteomyelitis. 4. Distal tibialis posterior tendinopathy. 5. Mild plantar fasciitis. 6. Thin/attenuated anterior talofibular ligament likely from a remote injury. 7. Degenerative thinning of the articular cartilage in the ankle and posterior subtalar joint. 8. Dorsal spurring at the talonavicular joint. Electronically Signed   By: Gaylyn Rong M.D.   On: 06/19/2023 16:56   DG Ankle Complete Left  Result Date: 06/19/2023 CLINICAL DATA:  Posterolateral left ankle diabetic wound. Evaluate for free air or gas. EXAM: LEFT ANKLE COMPLETE - 3+ VIEW COMPARISON:  Left foot radiographs 04/12/2023 FINDINGS: There is diffuse decreased bone mineralization. There is horizontal linear sclerosis within the expected region of the mid to lateral aspect of the distal tibial physis, similar to prior 04/12/2023 lateral foot radiograph appearing chronic. It could represent a subacute to older prior stress fracture. There is lucency and apparent concavity of the posterior soft tissues of the distal calf, centered approximately 10 cm proximal to the posterosuperior aspect of the calcaneus. There is longitudinal linear lucency at the posterior cortex of the adjacent fibula along an approximate 4 cm craniocaudal length on lateral view, suspicious for bone erosion. There is relative lucency seen in this region of the distal fibular diaphysis on transverse view. Which Moderate distal fibula and adjacent lateral talar process degenerative spurring. Mild to moderate  posterior and mild medial tibiotalar joint space narrowing. Moderate anterior and posterior tibial plafond degenerative osteophytes. Mild-to-moderate joint space narrowing and subchondral sclerosis throughout the midfoot. Mild-to-moderate plantar and mild posterior calcaneal heel spurs. No subcutaneous emphysema is seen. IMPRESSION: 1. Apparent soft tissue wound of the posterior distal calf, centered approximately 10 cm proximal to the posterosuperior aspect of the calcaneus. There is longitudinal linear lucency at the posterior cortex of the adjacent fibular diaphysis along an approximate 4 cm craniocaudal length concerning for bone erosion and acute osteomyelitis. 2. There is horizontal linear sclerosis within the expected region of the mid to lateral aspect of the distal tibial physis, similar to prior 04/12/2023 lateral foot radiograph appearing chronic. It could represent a subacute to older prior stress fracture. 3. Moderate tibiotalar and midfoot osteoarthritis. Electronically Signed   By: Neita Garnet M.D.   On: 06/19/2023 14:07     Assessment and Plan:   CAD with inferior STEMI 12/2022 s/p PCI to RCA -patient should remain on dual antiplatelet therapy for one year from PCI unless planned for surgery -reviewed surgical consult for today. No immediate plans for surgery. However, if this changes, brilinta can be held prior to surgery (ideally 5 days prior) and restarted as soon as possible post operatively. Aspirin should be continued indefinitely and throughout the perioperative period. -continue statin. Currently on 5 mg twice weekly. Would re-evaluate this as an outpatient in order to add [id number]. Goal LDL <55 with diabetes, last LDL 66 in 01/2023. Will recheck tomorrow AM with fasting labs. Lpa 72.7. -normal EF. Given chronic hypotension, would not use beta blocker  Type II diabetes -last A1c 8.4, recently as high as 13.3 -on insulin and empagliflozin  Orthostatic hypotension: -as he is  nonambulatory, unclear how much this limits him -on florinef  Left lower extremity osteomyelitis -no immediate plans for surgery -management per primary team and likely infectious disease -patient is chronically nonambulatory   Risk Assessment/Risk Scores:            [facility] will sign off.   Medication Recommendations:  no changes at this time. See comments re: DAPT if surgery is pursued Other recommendations (labs, testing, etc):  none Follow up as an outpatient:  he has been hospitalized multiple times since his STEMI but has not been seen as an outpatient for cardiology follow up. We will arrange for routine outpatient cardiology follow up in several weeks.  For questions or updates, please contact [facility] Please consult [email or web address] for contact info under    Signed, Jodelle Red, MD  06/20/2023 2:53 PM

## 2023-06-20 NOTE — Consult Note (Addendum)
Reason for Consult:Left lower leg wound Referring Physician: Lacretia Nicks Time called: 0730 Time at bedside: 0820   Micheal Kuhnle. is an 72 y.o. male.  HPI: Micheal Moore was sent to the ED from SNF for a left lower leg wound. He says it started months ago because the SNF beds are too short and he was resting his legs on the footboard. 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 He was admitted and orthopedic surgery was consulted. He denies much bother from the wound.  Past Medical History:  Diagnosis Date   Cancer (HCC)    right elbow melanoma   Diabetes mellitus without complication (HCC)    Type II   Dizziness    DVT (deep venous thrombosis) (HCC)    right leg   Frequent falls    Near syncope    Syncope     Past Surgical History:  Procedure Laterality Date   AMPUTATION TOE Left 04/28/2020   Procedure: AMPUTATION LEFT GREAT TOE;  Surgeon: Nadara Mustard, MD;  Location: [facility] OR;  Service: Orthopedics;  Laterality: Left;   CORONARY/GRAFT ACUTE MI REVASCULARIZATION N/A 01/15/2023   Procedure: Coronary/Graft Acute MI Revascularization;  Surgeon: Orbie Pyo, MD;  Location: [facility] INVASIVE CV LAB;  Service: Cardiovascular;  Laterality: N/A;   INTRAMEDULLARY (IM) NAIL INTERTROCHANTERIC Right 02/23/2022   Procedure: INTRAMEDULLARY (IM) NAIL INTERTROCHANTRIC, RIGHT;  Surgeon: Samson Frederic, MD;  Location: [facility]S;  Service: Orthopedics;  Laterality: Right;   LEFT HEART CATH AND CORONARY ANGIOGRAPHY N/A 01/15/2023   Procedure: LEFT HEART CATH AND CORONARY ANGIOGRAPHY;  Surgeon: Orbie Pyo, MD;  Location: [facility];  Service: Cardiovascular;  Laterality: N/A;   LOWER EXTREMITY ANGIOGRAPHY Bilateral 01/18/2023   Procedure: Lower Extremity Angiography;  Surgeon: Nada Libman, MD;  Location: [facility];  Service: Cardiovascular;  Laterality: Bilateral;   MELANOMA EXCISION WITH SENTINEL LYMPH NODE BIOPSY Right 08/19/2020   Procedure: WIDE LOCAL EXCISION RIGHT ELBOW MELANOMA  WITH RIGHT AXILLARY SENTINEL LYMPH NODE BIOPSY;  Surgeon: Fritzi Mandes, MD;  Location: [facility];  Service: General;  Laterality: Right;  2nd incision in axilla   TEE WITHOUT CARDIOVERSION N/A 04/19/2023   Procedure: TRANSESOPHAGEAL ECHOCARDIOGRAM;  Surgeon: Little Ishikawa, MD;  Location: [facility] INVASIVE CV LAB;  Service: Cardiovascular;  Laterality: N/A;   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   Procedure: TOTAL HIP ARTHROPLASTY ANTERIOR APPROACH;  Surgeon: Samson Frederic, MD;  Location: [facility];  Service: Orthopedics;  Laterality: Left;   TOTAL HIP ARTHROPLASTY Left 08/24/2022   Procedure: OPEN REDUCTION, HEAD AND LINER EXCHANGE;  Surgeon: Samson Frederic, MD;  Location: [facility];  Service: Orthopedics;  Laterality: Left;    Family History  Problem Relation Age of Onset   Stroke Mother    Diabetes Father    Stroke Sister    Diabetes Sister     Social History:  reports that he has been smoking cigarettes. He has a 3.8 pack-year smoking history. He has never used smokeless tobacco. He reports that he does not currently use drugs. He reports that he does not drink alcohol.  Allergies: No Known Allergies  Medications: I have reviewed the patient's current medications.  Results for orders placed or performed during the hospital encounter of 06/19/23 (from the past 48 hour(s))  CBC     Status: None   Collection Time: 06/19/23  1:53 PM  Result Value Ref Range   WBC 6.8 4.0 - 10.5 K/uL   RBC 4.47 4.22 - 5.81 MIL/uL  Hemoglobin 13.1 13.0 - 17.0 g/dL   HCT 16.1 09.6 - 04.5 %   MCV 91.9 80.0 - 100.0 fL   MCH 29.3 26.0 - 34.0 pg   MCHC 31.9 30.0 - 36.0 g/dL   RDW 40.9 81.1 - 91.4 %   Platelets 362 150 - 400 K/uL   nRBC 0.0 0.0 - 0.2 %    Comment: Performed at [facility], [address], [place], [place] [address]  I-Stat CG4 Lactic Acid     Status: None   Collection Time: 06/19/23  2:17 PM  Result Value Ref Range   Lactic Acid, Venous 1.7 0.5 - 1.9 mmol/L  CBC     Status:  Abnormal   Collection Time: 06/19/23  5:59 PM  Result Value Ref Range   WBC 6.8 4.0 - 10.5 K/uL   RBC 4.35 4.22 - 5.81 MIL/uL   Hemoglobin 12.7 (L) 13.0 - 17.0 g/dL   HCT 62.1 30.8 - 65.7 %   MCV 92.0 80.0 - 100.0 fL   MCH 29.2 26.0 - 34.0 pg   MCHC 31.8 30.0 - 36.0 g/dL   RDW 84.6 96.2 - 95.2 %   Platelets 377 150 - 400 K/uL   nRBC 0.0 0.0 - 0.2 %    Comment: Performed at [facility], [address] [place], [place] [address]  Basic metabolic panel     Status: Abnormal   Collection Time: 06/19/23  6:03 PM  Result Value Ref Range   Sodium 134 (L) 135 - 145 mmol/L   Potassium 4.0 3.5 - 5.1 mmol/L   Chloride 98 98 - 111 mmol/L   CO2 22 22 - 32 mmol/L   Glucose, Bld 149 (H) 70 - 99 mg/dL    Comment: Glucose reference range applies only to samples taken after fasting for at least 8 hours.   BUN 18 8 - 23 mg/dL   Creatinine, Ser 4.40 0.61 - 1.24 mg/dL   Calcium 9.0 8.9 - 10.2 mg/dL   GFR, Estimated >72 >53 mL/min    Comment: (NOTE) Calculated using the CKD-EPI Creatinine Equation (2021)    Anion gap 14 5 - 15    Comment: Performed at [facility], [address] [place], [place] [address]  C-reactive protein     Status: Abnormal   Collection Time: 06/19/23  6:03 PM  Result Value Ref Range   CRP 11.6 (H) <1.0 mg/dL    Comment: Performed at [facility], [address] [place], [place] [address]  Hemoglobin A1c     Status: Abnormal   Collection Time: 06/19/23  6:03 PM  Result Value Ref Range   Hgb A1c MFr Bld 8.4 (H) 4.8 - 5.6 %    Comment: (NOTE) Pre diabetes:          5.7%-6.4%  Diabetes:              >6.4%  Glycemic control for   <7.0% adults with diabetes    Mean Plasma Glucose 194.38 mg/dL    Comment: Performed at [facility] Lab, [address], [place], [place] [address]  Glucose, capillary     Status: Abnormal   Collection Time: 06/19/23  6:42 PM  Result Value Ref Range   Glucose-Capillary 153 (H) 70 - 99 mg/dL    Comment: Glucose  reference range applies only to samples taken after fasting for at least 8 hours.  Glucose, capillary     Status: Abnormal   Collection Time:  06/19/23  7:42 PM  Result Value Ref Range   Glucose-Capillary 238 (H) 70 - 99 mg/dL    Comment: Glucose reference range applies only to samples taken after fasting for at least 8 hours.  CBC     Status: Abnormal   Collection Time: 06/20/23  6:24 AM  Result Value Ref Range   WBC 6.7 4.0 - 10.5 K/uL   RBC 4.37 4.22 - 5.81 MIL/uL   Hemoglobin 12.7 (L) 13.0 - 17.0 g/dL   HCT 81.1 91.4 - 78.2 %   MCV 90.6 80.0 - 100.0 fL   MCH 29.1 26.0 - 34.0 pg   MCHC 32.1 30.0 - 36.0 g/dL   RDW 95.6 21.3 - 08.6 %   Platelets 346 150 - 400 K/uL   nRBC 0.0 0.0 - 0.2 %    Comment: Performed at [facility], [address] [place], [place] [address]  Comprehensive metabolic panel     Status: Abnormal   Collection Time: 06/20/23  6:24 AM  Result Value Ref Range   Sodium 130 (L) 135 - 145 mmol/L   Potassium 3.8 3.5 - 5.1 mmol/L   Chloride 95 (L) 98 - 111 mmol/L   CO2 22 22 - 32 mmol/L   Glucose, Bld 202 (H) 70 - 99 mg/dL    Comment: Glucose reference range applies only to samples taken after fasting for at least 8 hours.   BUN 20 8 - 23 mg/dL   Creatinine, Ser 9.62 0.61 - 1.24 mg/dL   Calcium 8.9 8.9 - 95.2 mg/dL   Total Protein 7.4 6.5 - 8.1 g/dL   Albumin 2.9 (L) 3.5 - 5.0 g/dL   AST 12 (L) 15 - 41 U/L   ALT 12 0 - 44 U/L   Alkaline Phosphatase 61 38 - 126 U/L   Total Bilirubin 0.4 0.3 - 1.2 mg/dL   GFR, Estimated >84 >13 mL/min    Comment: (NOTE) Calculated using the CKD-EPI Creatinine Equation (2021)    Anion gap 13 5 - 15    Comment: Performed at [facility], [address] [place], [place] [address]  Protime-INR     Status: None   Collection Time: 06/20/23  6:24 AM  Result Value Ref Range   Prothrombin Time 14.1 11.4 - 15.2 seconds   INR 1.1 0.8 - 1.2    Comment: (NOTE) INR goal varies based on device and disease states. Performed  at [facility], [address], [place], [place] [address]   Glucose, capillary     Status: Abnormal   Collection Time: 06/20/23  7:40 AM  Result Value Ref Range   Glucose-Capillary 170 (H) 70 - 99 mg/dL    Comment: Glucose reference range applies only to samples taken after fasting for at least 8 hours.    MR ANKLE LEFT W WO CONTRAST  Result Date: 06/19/2023 CLINICAL DATA:  Osteomyelitis.  Left posterolateral ankle wound. EXAM: MRI OF THE LEFT ANKLE WITHOUT AND WITH CONTRAST TECHNIQUE: Multiplanar, multisequence MR imaging of the ankle was performed before and after the administration of intravenous contrast. CONTRAST:  9mL GADAVIST GADOBUTROL 1 MMOL/ML IV SOLN COMPARISON:  Radiographs 06/19/2023 FINDINGS: TENDONS Peroneal: Unremarkable Posteromedial: Distal tibialis posterior tendinopathy. Anterior: Unremarkable Achilles: Unremarkable Plantar Fascia: Mildly accentuated signal in the medial band of the plantar fascia for example on image 30 series 8 compatible with plantar fasciitis. LIGAMENTS Lateral: Thin/attenuated anterior talofibular ligament likely from a remote injury. Medial: Unremarkable CARTILAGE Ankle Joint: Degenerative thinning of  the articular cartilage. Subtalar Joints/Sinus Tarsi: Chondral thinning medially in the posterior subtalar joint. Bones: There is active osteomyelitis of the fibular diaphysis extending to the distal metaphysis. Centered about 12.7 cm proximal to the distal fibular tip, there is a large posterolateral ulceration of the cutaneous and subcutaneous tissues with the ulcer crater extending to the periosteal margin of the fibula (image 14, series 7) and potentially exposing the bony surface of the fibula. In addition to abnormal edema and enhancement throughout the fibular shaft and distal metaphysis, there is bony demineralization and thinning of the cortex tangential to this ulceration, and surrounding inflammatory edema extending in adjacent muscle groups  including the extensor digitorum, peroneus, and flexor hallucis longus muscle groups. This likely represents myositis adjacent to the osteomyelitis. I do not see a drainable abscess in the soft tissues. There is some endosteal edema in the distal tibia, probably reactive in this context rather than representing concomitant osteomyelitis. Posteriorly in the calcaneus as on image 10 of series 4 there is abnormal edema and mild enhancement as on image 35 series 11 suspicious for early osteomyelitis. Dorsal spurring at the talonavicular joint. Other: No supplemental non-categorized findings. IMPRESSION: IMPRESSION 1. Large posterolateral ulceration of the lower leg cutaneous and subcutaneous tissues with the ulcer crater extending to the periosteal margin of the fibular diaphysis and potentially exposing the bony surface of the fibula. There is active osteomyelitis of the fibular shaft and distal metaphysis, with surrounding myositis. 2. There is some endosteal edema in the distal tibia, probably reactive in this context rather than representing concomitant osteomyelitis. 3. There is abnormal edema and mild enhancement posteriorly in the calcaneus suspicious for early osteomyelitis. 4. Distal tibialis posterior tendinopathy. 5. Mild plantar fasciitis. 6. Thin/attenuated anterior talofibular ligament likely from a remote injury. 7. Degenerative thinning of the articular cartilage in the ankle and posterior subtalar joint. 8. Dorsal spurring at the talonavicular joint. Electronically Signed   By: Gaylyn Rong M.D.   On: 06/19/2023 16:56   DG Ankle Complete Left  Result Date: 06/19/2023 CLINICAL DATA:  Posterolateral left ankle diabetic wound. Evaluate for free air or gas. EXAM: LEFT ANKLE COMPLETE - 3+ VIEW COMPARISON:  Left foot radiographs 04/12/2023 FINDINGS: There is diffuse decreased bone mineralization. There is horizontal linear sclerosis within the expected region of the mid to lateral aspect of the  distal tibial physis, similar to prior 04/12/2023 lateral foot radiograph appearing chronic. It could represent a subacute to older prior stress fracture. There is lucency and apparent concavity of the posterior soft tissues of the distal calf, centered approximately 10 cm proximal to the posterosuperior aspect of the calcaneus. There is longitudinal linear lucency at the posterior cortex of the adjacent fibula along an approximate 4 cm craniocaudal length on lateral view, suspicious for bone erosion. There is relative lucency seen in this region of the distal fibular diaphysis on transverse view. Which Moderate distal fibula and adjacent lateral talar process degenerative spurring. Mild to moderate posterior and mild medial tibiotalar joint space narrowing. Moderate anterior and posterior tibial plafond degenerative osteophytes. Mild-to-moderate joint space narrowing and subchondral sclerosis throughout the midfoot. Mild-to-moderate plantar and mild posterior calcaneal heel spurs. No subcutaneous emphysema is seen. IMPRESSION: 1. Apparent soft tissue wound of the posterior distal calf, centered approximately 10 cm proximal to the posterosuperior aspect of the calcaneus. There is longitudinal linear lucency at the posterior cortex of the adjacent fibular diaphysis along an approximate 4 cm craniocaudal length concerning for bone erosion and acute osteomyelitis. 2. There  is horizontal linear sclerosis within the expected region of the mid to lateral aspect of the distal tibial physis, similar to prior 04/12/2023 lateral foot radiograph appearing chronic. It could represent a subacute to older prior stress fracture. 3. Moderate tibiotalar and midfoot osteoarthritis. Electronically Signed   By: Neita Garnet M.D.   On: 06/19/2023 14:07    Review of Systems  Constitutional:  Negative for chills, diaphoresis and fever.  HENT:  Negative for ear discharge, ear pain, hearing loss and tinnitus.   Eyes:  Negative for  photophobia and pain.  Respiratory:  Negative for cough and shortness of breath.   Cardiovascular:  Negative for chest pain.  Gastrointestinal:  Negative for abdominal pain, nausea and vomiting.  Genitourinary:  Negative for dysuria, flank pain, frequency and urgency.  Musculoskeletal:  Negative for arthralgias, back pain, myalgias and neck pain.  Skin:  Positive for wound.  Neurological:  Negative for dizziness and headaches.  Hematological:  Does not bruise/bleed easily.  Psychiatric/Behavioral:  The patient is not nervous/anxious.    Blood pressure (!) 131/58, pulse 94, temperature 98.3 F (36.8 C), temperature source Oral, resp. rate 17, height 6\' 5"  (1.956 m), weight 88.5 kg, SpO2 100%. Physical Exam Constitutional:      General: He is not in acute distress.    Appearance: He is well-developed. He is not diaphoretic.  HENT:     Head: Normocephalic and atraumatic.  Eyes:     General: No scleral icterus.       Right eye: No discharge.        Left eye: No discharge.     Conjunctiva/sclera: Conjunctivae normal.  Cardiovascular:     Rate and Rhythm: Normal rate and regular rhythm.  Pulmonary:     Effort: Pulmonary effort is normal. No respiratory distress.  Musculoskeletal:     Cervical back: Normal range of motion.     Comments: LLE No traumatic wounds, ecchymosis, or rash  Ulceration lateral lower leg extending to fibula, mod TTP, purulent discharge, no odor  No knee or ankle effusion  Knee stable to varus/ valgus and anterior/posterior stress  Sens DPN, SPN, TN intact  Motor EHL, ext, flex, evers 5/5  DP 2+, PT 2+, No significant edema  Skin:    General: Skin is warm and dry.  Neurological:     Mental Status: He is alert.  Psychiatric:        Mood and Affect: Mood normal.        Behavior: Behavior normal.     Assessment/Plan: Left lower leg ulcer/osteo -- No immediate surgical indication. Recommend ID consult as pt would like to avoid amputation if at all possible  though we did discuss the possibility of fibular resection vs BKA. Will change dressings to wet-to-dry to encourage soft tissue coverage. Awaiting results of ABI; if abnormal will need vascular consultation. If still admitted next week will have Dr. Lajoyce Corners see as inpatient.    Freeman Caldron, PA-C Orthopedic Surgery [phone] 06/20/2023, 8:34 AM

## 2023-06-20 NOTE — Consult Note (Signed)
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 At that time, left lower leg wound was known but superficial and without signs of infecton and XR without osteomyelitis.  Rash on back and L buttock Very itchy per patient. Maculopapular, appears as possible heat rash, diffuse and erythematous without lesions. Rec keeping area dry. Will defer to primary team, can consider atarax for pruritus.    Principal Problem:   Osteomyelitis (HCC) Active Problems:   Type 2 diabetes mellitus with hyperlipidemia (HCC)   Hyperlipidemia   CAD (coronary artery disease)   PVD (peripheral vascular disease) (HCC)   Ambulatory dysfunction    aspirin EC  81 mg Oral Daily   empagliflozin  10 mg Oral Daily   enoxaparin (LOVENOX) injection  40 mg Subcutaneous Q24H   fludrocortisone  0.2 mg Oral Daily   insulin aspart  0-9 Units Subcutaneous TID WC   insulin detemir  20 Units Subcutaneous QHS   polyethylene glycol  17 g Oral Daily   rosuvastatin  5 mg Oral Once per day on Monday Thursday   senna  1 tablet Oral BID   sodium chloride flush  3 mL Intravenous Q12H    ticagrelor  90 mg Oral BID    HPI: Micheal Moore. is a 72 y.o. male with PMH CAD s/p PCI in April 2024 for stemi, PAD, T2DM, hip fracture in late 2023, polymicrobial bacteremia in 04/2023 (MRSA and Providencia) who is now hospitalized for a chronic L distal leg wound with osteomyelitis of the distal fibula.  He reports that his wound on the L distal lateral leg has been present for many months, between 6 and 12. He believes it was arose over the course of recovery from a hip fracture in late 2023, after which he was bed bound and often resting his foot on a bed rail because the bed was too small. He presented to [facility] hospital for evaluation because it has not healed over time. The wound is painful with movement but not at rest and it has occasional bleeding, but these are not significantly changed from typical baseline over the last few months.   Review of Systems: Review of Systems  Constitutional:  Negative for chills, fever and weight loss.  Respiratory:  Negative for cough.   Cardiovascular:  Negative for leg swelling.  Gastrointestinal:  Negative for constipation, diarrhea, nausea and vomiting.  Musculoskeletal:  Positive for joint pain.  Skin:  Positive for itching and rash.  Neurological:  Negative for sensory change and weakness.    Past Medical History:  Diagnosis  Date   Cancer [facility])    right elbow melanoma   Diabetes mellitus without complication (HCC)    Type II   Dizziness    DVT (deep venous thrombosis) (HCC)    right leg   Frequent falls    Near syncope    Syncope     Social History   Tobacco Use   Smoking status: Every Day    Current packs/day: 0.20    Average packs/day: 0.2 packs/day for 19.0 years (3.8 ttl pk-yrs)    Types: Cigarettes   Smokeless tobacco: Never  Vaping Use   Vaping status: Never Used  Substance Use Topics   Alcohol use: No   Drug use: Not Currently    Family History  Problem Relation Age of Onset   Stroke Mother     Diabetes Father    Stroke Sister    Diabetes Sister    No Known Allergies  OBJECTIVE: Blood pressure 107/75, pulse (!) 103, temperature 98.2 F (36.8 C), temperature source Oral, resp. rate 17, height 6\' 5"  (1.956 m), weight 88.5 kg, SpO2 100%.  Physical Exam Constitutional:      General: He is not in acute distress.    Appearance: Normal appearance. He is not ill-appearing.  HENT:     Head: Normocephalic and atraumatic.  Cardiovascular:     Rate and Rhythm: Normal rate and regular rhythm.     Pulses: Normal pulses.     Heart sounds: Normal heart sounds.  Pulmonary:     Effort: Pulmonary effort is normal.     Breath sounds: Normal breath sounds.  Abdominal:     General: Abdomen is flat. Bowel sounds are normal.     Palpations: Abdomen is soft.  Musculoskeletal:        General: Signs of injury present.     Right lower leg: No edema.     Left lower leg: No edema.  Skin:    General: Skin is warm and dry.     Findings: Rash present.     Comments: Diffuse erythematous rash on back and R buttock  Neurological:     General: No focal deficit present.     Mental Status: He is alert and oriented to person, place, and time.  Psychiatric:        Mood and Affect: Mood normal.        Behavior: Behavior normal.     Lab Results Lab Results  Component Value Date   WBC 6.7 06/20/2023   HGB 12.7 (L) 06/20/2023   HCT 39.6 06/20/2023   MCV 90.6 06/20/2023   PLT 346 06/20/2023    Lab Results  Component Value Date   CREATININE 0.92 06/20/2023   BUN 20 06/20/2023   NA 130 (L) 06/20/2023   K 3.8 06/20/2023   CL 95 (L) 06/20/2023   CO2 22 06/20/2023    Lab Results  Component Value Date   ALT 12 06/20/2023   AST 12 (L) 06/20/2023   ALKPHOS 61 06/20/2023   BILITOT 0.4 06/20/2023     Microbiology: Recent Results (from the past 240 hour(s))  Blood culture (routine x 2)     Status: None (Preliminary result)   Collection Time: 06/19/23  1:12 PM   Specimen: BLOOD  Result  Value Ref Range Status   Specimen Description BLOOD RIGHT ANTECUBITAL  Final   Special Requests   Final    BOTTLES DRAWN AEROBIC ONLY Blood Culture adequate volume   Culture   Final    NO  GROWTH < 24 HOURS Performed at [facility], [address], [place], [place] [address]    Report Status PENDING  Incomplete  Blood culture (routine x 2)     Status: None (Preliminary result)   Collection Time: 06/19/23  1:53 PM   Specimen: BLOOD LEFT ARM  Result Value Ref Range Status   Specimen Description BLOOD LEFT ARM  Final   Special Requests   Final    BOTTLES DRAWN AEROBIC AND ANAEROBIC Blood Culture results may not be optimal due to an inadequate volume of blood received in culture bottles   Culture   Final    NO GROWTH < 24 HOURS Performed at [facility], [address] [place], [place] [address]    Report Status PENDING  Incomplete    Katheran James, [facility] Internal Medicine Resident - [id number] Pager: [phone]  06/20/2023 4:25 PM

## 2023-06-21 ENCOUNTER — Encounter (HOSPITAL_COMMUNITY): Payer: Medicare HMO

## 2023-06-21 DIAGNOSIS — Z8614 Personal history of Methicillin resistant Staphylococcus aureus infection: Secondary | ICD-10-CM | POA: Diagnosis not present

## 2023-06-21 DIAGNOSIS — M86162 Other acute osteomyelitis, left tibia and fibula: Secondary | ICD-10-CM | POA: Diagnosis not present

## 2023-06-21 DIAGNOSIS — M869 Osteomyelitis, unspecified: Secondary | ICD-10-CM | POA: Diagnosis not present

## 2023-06-21 DIAGNOSIS — S81802A Unspecified open wound, left lower leg, initial encounter: Secondary | ICD-10-CM | POA: Diagnosis not present

## 2023-06-21 LAB — CBC WITH DIFFERENTIAL/PLATELET
Abs Immature Granulocytes: 0.03 10*3/uL (ref 0.00–0.07)
Basophils Absolute: 0.1 10*3/uL (ref 0.0–0.1)
Basophils Relative: 1 %
Eosinophils Absolute: 0.4 10*3/uL (ref 0.0–0.5)
Eosinophils Relative: 8 %
HCT: 38.7 % — ABNORMAL LOW (ref 39.0–52.0)
Hemoglobin: 12.3 g/dL — ABNORMAL LOW (ref 13.0–17.0)
Immature Granulocytes: 1 %
Lymphocytes Relative: 31 %
Lymphs Abs: 1.5 10*3/uL (ref 0.7–4.0)
MCH: 29.1 pg (ref 26.0–34.0)
MCHC: 31.8 g/dL (ref 30.0–36.0)
MCV: 91.5 fL (ref 80.0–100.0)
Monocytes Absolute: 0.5 10*3/uL (ref 0.1–1.0)
Monocytes Relative: 10 %
Neutro Abs: 2.5 10*3/uL (ref 1.7–7.7)
Neutrophils Relative %: 49 %
Platelets: 329 10*3/uL (ref 150–400)
RBC: 4.23 MIL/uL (ref 4.22–5.81)
RDW: 14 % (ref 11.5–15.5)
WBC: 4.9 10*3/uL (ref 4.0–10.5)
nRBC: 0 % (ref 0.0–0.2)

## 2023-06-21 LAB — COMPREHENSIVE METABOLIC PANEL
ALT: 10 U/L (ref 0–44)
AST: 13 U/L — ABNORMAL LOW (ref 15–41)
Albumin: 2.8 g/dL — ABNORMAL LOW (ref 3.5–5.0)
Alkaline Phosphatase: 55 U/L (ref 38–126)
Anion gap: 13 (ref 5–15)
BUN: 19 mg/dL (ref 8–23)
CO2: 21 mmol/L — ABNORMAL LOW (ref 22–32)
Calcium: 8.6 mg/dL — ABNORMAL LOW (ref 8.9–10.3)
Chloride: 98 mmol/L (ref 98–111)
Creatinine, Ser: 0.64 mg/dL (ref 0.61–1.24)
GFR, Estimated: >60 mL/min (ref >60)
Glucose, Bld: 157 mg/dL — ABNORMAL HIGH (ref 70–99)
Potassium: 3.9 mmol/L (ref 3.5–5.1)
Sodium: 132 mmol/L — ABNORMAL LOW (ref 135–145)
Total Bilirubin: 0.5 mg/dL (ref 0.3–1.2)
Total Protein: 6.9 g/dL (ref 6.5–8.1)

## 2023-06-21 LAB — MAGNESIUM: Magnesium: 2.2 mg/dL (ref 1.7–2.4)

## 2023-06-21 LAB — LIPID PANEL
Cholesterol: 138 mg/dL (ref 0–200)
HDL: 32 mg/dL — ABNORMAL LOW (ref >40)
LDL Cholesterol: 83 mg/dL (ref 0–99)
Total CHOL/HDL Ratio: 4.3 {ratio}
Triglycerides: 114 mg/dL (ref <150)
VLDL: 23 mg/dL (ref 0–40)

## 2023-06-21 LAB — GLUCOSE, CAPILLARY
Glucose-Capillary: 142 mg/dL — ABNORMAL HIGH (ref 70–99)
Glucose-Capillary: 150 mg/dL — ABNORMAL HIGH (ref 70–99)
Glucose-Capillary: 203 mg/dL — ABNORMAL HIGH (ref 70–99)
Glucose-Capillary: 263 mg/dL — ABNORMAL HIGH (ref 70–99)

## 2023-06-21 LAB — SEDIMENTATION RATE: Sed Rate: 59 mm/h — ABNORMAL HIGH (ref 0–16)

## 2023-06-21 LAB — PHOSPHORUS: Phosphorus: 3.9 mg/dL (ref 2.5–4.6)

## 2023-06-21 MED ORDER — METRONIDAZOLE 500 MG PO TABS
500.0000 mg | ORAL_TABLET | Freq: Two times a day (BID) | ORAL | Status: DC
  Administered 2023-06-21 – 2023-06-24 (×6): 500 mg via ORAL
  Filled 2023-06-21 (×6): qty 1

## 2023-06-21 MED ORDER — ENSURE ENLIVE PO LIQD
237.0000 mL | Freq: Two times a day (BID) | ORAL | Status: DC
  Administered 2023-06-21 – 2023-07-05 (×23): 237 mL via ORAL

## 2023-06-21 MED ORDER — HYDROXYZINE HCL 10 MG PO TABS
10.0000 mg | ORAL_TABLET | Freq: Three times a day (TID) | ORAL | Status: DC | PRN
  Administered 2023-06-21 – 2023-07-05 (×27): 10 mg via ORAL
  Filled 2023-06-21 (×28): qty 1

## 2023-06-21 MED ORDER — DAPTOMYCIN-SODIUM CHLORIDE 700-0.9 MG/100ML-% IV SOLN
8.0000 mg/kg | Freq: Every day | INTRAVENOUS | Status: AC
  Administered 2023-06-21 – 2023-06-28 (×8): 700 mg via INTRAVENOUS
  Filled 2023-06-21 (×11): qty 100

## 2023-06-21 MED ORDER — HYDROXYZINE HCL 25 MG PO TABS
25.0000 mg | ORAL_TABLET | Freq: Once | ORAL | Status: AC
  Administered 2023-06-21: 25 mg via ORAL
  Filled 2023-06-21: qty 1

## 2023-06-21 NOTE — Progress Notes (Signed)
Pharmacy Antibiotic Note  Micheal Moore. is a 72 y.o. male admitted on 06/19/2023 with  osteomyelitis  and history of MRSA bacteremia.  Continues on Vancomycin, Ceftriaxone, and Metronidazole.  SCr WNL BC negative to date  Plan: Continue Vancomycin 1500 mg IV every 12 hours Monitor CBC, renal function, cultures, and signs of clinical improvement ID following   Height: 6\' 5"  (195.6 cm) Weight: 88.5 kg (195 lb) IBW/kg (Calculated) : 89.1  Temp (24hrs), Avg:98.1 F (36.7 C), Min:97.6 F (36.4 C), Max:98.4 F (36.9 C)  Recent Labs  Lab 06/19/23 1353 06/19/23 1417 06/19/23 1759 06/19/23 1803 06/20/23 0624 06/21/23 0551  WBC 6.8  --  6.8  --  6.7 4.9  CREATININE  --   --   --  0.89 0.92 0.64  LATICACIDVEN  --  1.7  --   --   --   --     Estimated Creatinine Clearance: 104.5 mL/min (by C-G formula based on SCr of 0.64 mg/dL).    No Known Allergies  Antimicrobials this admission: 10/2 vancomycin  >>  10/2 ceftriaxone >>  10/2 flagyl>>   Microbiology results: 10/2 BCx: sent  Okey Regal, PharmD 06/21/2023 8:59 AM

## 2023-06-21 NOTE — Plan of Care (Signed)
  Problem: Metabolic: Goal: Ability to maintain appropriate glucose levels will improve Outcome: Progressing   Problem: Respiratory: Goal: Will regain and/or maintain adequate ventilation Outcome: Progressing   Problem: Activity: Goal: Ability to avoid complications of mobility impairment will improve Outcome: Progressing   Problem: Pain Management: Goal: Pain level will decrease with appropriate interventions Outcome: Progressing

## 2023-06-21 NOTE — Progress Notes (Signed)
[facility]    Date of Admission:  06/19/2023    Total days of antibiotics 3 Day 3 Ceftriaxone 2g IV every day Day 3 flagyl 500mg  IV BID Day 3 Vancomycin         ID: Micheal Moore. is a 72 y.o. 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 No complaints this morning. He has some pain at leg wound still but not worse. No fevers or chills. He is ordering lunch.  Medications:   aspirin EC  81 mg Oral Daily   empagliflozin  10 mg Oral Daily   enoxaparin (LOVENOX) injection  40 mg Subcutaneous Q24H   feeding supplement  237 mL Oral BID BM   fludrocortisone  0.2 mg Oral Daily   Gerhardt's butt cream   Topical BID   insulin aspart  0-9 Units Subcutaneous TID WC   insulin detemir  20 Units Subcutaneous QHS   metroNIDAZOLE  500 mg Oral Q12H   polyethylene glycol  17 g Oral Daily   rosuvastatin  5 mg Oral Once per day on Monday Thursday   senna  1 tablet Oral BID   sodium chloride flush  3 mL Intravenous Q12H   ticagrelor  90 mg Oral BID    Objective: Vital signs in last 24 hours: Temp:  [97.6 F (36.4 C)-98.4 F (36.9 C)] 97.6 F (36.4 C) (10/04 0740) Pulse Rate:  [94-99] 94 (10/04 0740) Resp:  [17-18] 17 (10/04 0740) BP: (112-135)/(70-83) 135/83 (10/04 0740) SpO2:  [94 %-100 %] 94 % (10/04 0740) Weight:  [88.5 kg] 88.5 kg (10/04 0418)   General appearance: alert, cooperative, and no distress Cardio: regular rate and rhythm, S1, S2 normal, no murmur, click, rub or gallop GI: soft, non-tender; bowel sounds normal; no masses,  no organomegaly Extremities: extremities normal,  atraumatic, no cyanosis or edema Skin: Skin color, texture, turgor normal. No rashes or lesions Incision/Wound: L lateral leg ulcerative wound is clean and bandaged without active bleeding, spreading erythema, or pustular drainage  Lab Results Recent Labs    06/20/23 0624 06/21/23 0551  WBC 6.7 4.9  HGB 12.7* 12.3*  HCT 39.6 38.7*  NA 130* 132*  K 3.8 3.9  CL 95* 98  CO2 22 21*  BUN 20 19  CREATININE 0.92 0.64   Liver Panel Recent Labs    06/20/23 0624 06/21/23 0551  PROT 7.4 6.9  ALBUMIN 2.9* 2.8*  AST 12* 13*  ALT 12 10  ALKPHOS 61 55  BILITOT 0.4 0.5   Sedimentation Rate Recent Labs    06/21/23 0551  ESRSEDRATE 59*   C-Reactive Protein Recent Labs    06/19/23 1803  CRP 11.6*    Microbiology:  Studies/Results: MR ANKLE LEFT W WO CONTRAST  Result Date: 06/19/2023 CLINICAL DATA:  Osteomyelitis.  Left posterolateral ankle wound. EXAM: MRI OF THE LEFT ANKLE WITHOUT AND WITH CONTRAST TECHNIQUE: Multiplanar, multisequence MR imaging of the ankle was performed before and after the administration of intravenous contrast. CONTRAST:  9mL GADAVIST GADOBUTROL 1 MMOL/ML IV SOLN COMPARISON:  Radiographs 06/19/2023 FINDINGS: TENDONS Peroneal: Unremarkable Posteromedial: Distal tibialis posterior tendinopathy. Anterior: Unremarkable Achilles: Unremarkable Plantar  Fascia: Mildly accentuated signal in the medial band of the plantar fascia for example on image 30 series 8 compatible with plantar fasciitis. LIGAMENTS Lateral: Thin/attenuated anterior talofibular ligament likely from a remote injury. Medial: Unremarkable CARTILAGE Ankle Joint: Degenerative thinning of the articular cartilage. Subtalar Joints/Sinus Tarsi: Chondral thinning medially in the posterior subtalar joint. Bones: There is active osteomyelitis of the fibular diaphysis extending to the distal metaphysis. Centered about 12.7 cm proximal to the distal fibular tip, there is a large posterolateral ulceration of the  cutaneous and subcutaneous tissues with the ulcer crater extending to the periosteal margin of the fibula (image 14, series 7) and potentially exposing the bony surface of the fibula. In addition to abnormal edema and enhancement throughout the fibular shaft and distal metaphysis, there is bony demineralization and thinning of the cortex tangential to this ulceration, and surrounding inflammatory edema extending in adjacent muscle groups including the extensor digitorum, peroneus, and flexor hallucis longus muscle groups. This likely represents myositis adjacent to the osteomyelitis. I do not see a drainable abscess in the soft tissues. There is some endosteal edema in the distal tibia, probably reactive in this context rather than representing concomitant osteomyelitis. Posteriorly in the calcaneus as on image 10 of series 4 there is abnormal edema and mild enhancement as on image 35 series 11 suspicious for early osteomyelitis. Dorsal spurring at the talonavicular joint. Other: No supplemental non-categorized findings. IMPRESSION: IMPRESSION 1. Large posterolateral ulceration of the lower leg cutaneous and subcutaneous tissues with the ulcer crater extending to the periosteal margin of the fibular diaphysis and potentially exposing the bony surface of the fibula. There is active osteomyelitis of the fibular shaft and distal metaphysis, with surrounding myositis. 2. There is some endosteal edema in the distal tibia, probably reactive in this context rather than representing concomitant osteomyelitis. 3. There is abnormal edema and mild enhancement posteriorly in the calcaneus suspicious for early osteomyelitis. 4. Distal tibialis posterior tendinopathy. 5. Mild plantar fasciitis. 6. Thin/attenuated anterior talofibular ligament likely from a remote injury. 7. Degenerative thinning of the articular cartilage in the ankle and posterior subtalar joint. 8. Dorsal spurring at the talonavicular joint. Electronically  Signed   By: Gaylyn Rong M.D.   On: 06/19/2023 16:56     Assessment/Plan: L distal fibula Osteomyelitis L Lower leg wound - Continue broad treatment with vancomycin change to daptomycin (monitor CK), metronidazole, ceftriaxone - Sed rate 59, MRSA pending - blood cultures without growth at 2 days. Wound culture collected, pending, will follow. Will consider final treatment regimen in that light. - Will follow-up with ABIs and consider vascular intervention thereafter - Pt with MRSA bacteremia earlier this summer, and likely here   Hx of MRSA bacteremia Hospital and home treatment for bacteremia in Aug-Sept of this year, four week course of daptomycin and ciprofloxacin. At that time, left lower leg wound was known but superficial and without signs of infecton and XR without osteomyelitis.  Katheran James, [facility] Internal Medicine Resident - PGY1 Pager: [phone]  06/21/2023, 3:53 PM

## 2023-06-21 NOTE — Plan of Care (Signed)
Problem: Education: Goal: Understanding of CV disease, CV risk reduction, and recovery process will improve Outcome: Progressing Goal: Individualized Educational Video(s) Outcome: Progressing   Problem: Activity: Goal: Ability to return to baseline activity level will improve Outcome: Progressing   Problem: Cardiovascular: Goal: Ability to achieve and maintain adequate cardiovascular perfusion will improve Outcome: Progressing Goal: Vascular access site(s) Level 0-1 will be maintained Outcome: Progressing   Problem: Health Behavior/Discharge Planning: Goal: Ability to safely manage health-related needs after discharge will improve Outcome: Progressing   Problem: Education: Goal: Ability to describe self-care measures that may prevent or decrease complications (Diabetes Survival Skills Education) will improve Outcome: Progressing Goal: Individualized Educational Video(s) Outcome: Progressing   Problem: Cardiac: Goal: Ability to maintain an adequate cardiac output will improve Outcome: Progressing   Problem: Health Behavior/Discharge Planning: Goal: Ability to identify and utilize available resources and services will improve Outcome: Progressing Goal: Ability to manage health-related needs will improve Outcome: Progressing   Problem: Fluid Volume: Goal: Ability to achieve a balanced intake and output will improve Outcome: Progressing   Problem: Metabolic: Goal: Ability to maintain appropriate glucose levels will improve Outcome: Progressing   Problem: Nutritional: Goal: Maintenance of adequate nutrition will improve Outcome: Progressing Goal: Maintenance of adequate weight for body size and type will improve Outcome: Progressing   Problem: Respiratory: Goal: Will regain and/or maintain adequate ventilation Outcome: Progressing   Problem: Urinary Elimination: Goal: Ability to achieve and maintain adequate renal perfusion and functioning will improve Outcome:  Progressing   Problem: Education: Goal: Knowledge of the prescribed therapeutic regimen will improve Outcome: Progressing Goal: Understanding of discharge needs will improve Outcome: Progressing Goal: Individualized Educational Video(s) Outcome: Progressing   Problem: Activity: Goal: Ability to avoid complications of mobility impairment will improve Outcome: Progressing Goal: Ability to tolerate increased activity will improve Outcome: Progressing   Problem: Clinical Measurements: Goal: Postoperative complications will be avoided or minimized Outcome: Progressing   Problem: Pain Management: Goal: Pain level will decrease with appropriate interventions Outcome: Progressing   Problem: Skin Integrity: Goal: Will show signs of wound healing Outcome: Progressing   Problem: Education: Goal: Ability to describe self-care measures that may prevent or decrease complications (Diabetes Survival Skills Education) will improve Outcome: Progressing Goal: Individualized Educational Video(s) Outcome: Progressing   Problem: Coping: Goal: Ability to adjust to condition or change in health will improve Outcome: Progressing   Problem: Fluid Volume: Goal: Ability to maintain a balanced intake and output will improve Outcome: Progressing   Problem: Health Behavior/Discharge Planning: Goal: Ability to identify and utilize available resources and services will improve Outcome: Progressing Goal: Ability to manage health-related needs will improve Outcome: Progressing   Problem: Metabolic: Goal: Ability to maintain appropriate glucose levels will improve Outcome: Progressing   Problem: Nutritional: Goal: Maintenance of adequate nutrition will improve Outcome: Progressing Goal: Progress toward achieving an optimal weight will improve Outcome: Progressing   Problem: Skin Integrity: Goal: Risk for impaired skin integrity will decrease Outcome: Progressing   Problem: Tissue  Perfusion: Goal: Adequacy of tissue perfusion will improve Outcome: Progressing   Problem: Education: Goal: Knowledge of General Education information will improve Description: Including pain rating scale, medication(s)/side effects and non-pharmacologic comfort measures Outcome: Progressing   Problem: Health Behavior/Discharge Planning: Goal: Ability to manage health-related needs will improve Outcome: Progressing   Problem: Clinical Measurements: Goal: Ability to maintain clinical measurements within normal limits will improve Outcome: Progressing Goal: Will remain free from infection Outcome: Progressing Goal: Diagnostic test results will improve Outcome: Progressing Goal: Respiratory complications will  improve Outcome: Progressing Goal: Cardiovascular complication will be avoided Outcome: Progressing   Problem: Activity: Goal: Risk for activity intolerance will decrease Outcome: Progressing   Problem: Nutrition: Goal: Adequate nutrition will be maintained Outcome: Progressing

## 2023-06-21 NOTE — Progress Notes (Signed)
PROGRESS NOTE    [name].  ZOX:[id number] DOB: 04-Dec-1950 DOA: 06/19/2023 PCP: [name], MD  Chief Complaint  Patient presents with   Wound Infection    Brief Narrative:   Loman Ismaili. is Maytte Jacot 72 y.o. male with medical history significant of CAD s/p PCI April 2024, PAD, DM-2, chronic hypotension, recent hospitalization from 8/7-8/17 for polymicrobial bacteremia-discharged home on IV antibiotics presented to the hospital on 10/2 with worsening pain/discharge from his wound in his left ankle area.  Per patient-he has had Dane Kopke ulcer in this area of his posterior aspect of his left ankle for the past several months-he claims he developed it during his stay in rehab for hip fracture (bed was small-his legs were dangling out-and developed ulcers in bilateral posterior ankle areas-right also heel-left also worsened) .  Over the past several weeks-this has gradually worsened-but over the past few days-pain and discharge from the ulcer has markedly worsened.  He states the pain is intractable-10 out of 10-and reports foul-smelling discharge that is yellow color.  There is no history of fever. No history of nausea, vomiting, diarrhea, body aches or chills.  Per patient-he is usually nonambulatory at baseline since at least February 2024.    Assessment & Plan:   Principal Problem:   Osteomyelitis (HCC) Active Problems:   CAD (coronary artery disease)   Type 2 diabetes mellitus with hyperlipidemia (HCC)   PVD (peripheral vascular disease) (HCC)   Hyperlipidemia   Protein-calorie malnutrition, moderate (HCC)   Ambulatory dysfunction  Left diabetic foot infection Osteomyelitis  MRI with osteomyelitis of the fibular shaft and distal metaphysis with surrounding myositis - endosteal edema in distal tibia - abnormal edema and mild enhancement posteriorly in the calcaneus suspicious for early osteo (see report) Appreciate ortho recs -> no immediate surgical indication,  recommending ID consult, wet to dry dressing changes ABI pending ID consult -> deep tissue swab, ABI -- final abx regimen pending  Continue broad spectrum antibiotics    History of PAD Recent lower extremity angiogram May 2024-no significant femoropopliteal occlusive disease noted On dual antiplatelet/statin   CAD-s/p PCI to RCA April 2024 No anginal symptoms On aspirin/Brilinta/statin (suspect not on beta-blocker due to history of chronic hypotension) Appreciate cardiology assistance -> should remain on DAPT for 1 year unless plan for surgery (brilinta can be held prior to surgery if needed - 5 days prior and restarted ASAP post operatively.  Aspirin should be continued indefinitely and throughout perioperative period.  See 10/3 cardiology note.   DM-2 Levemir 20 units/SSI/Jardiance Follow CBGs and optimize   History of orthostatic hypotension Florinef   Chronic ambulatory dysfunction/nonambulatory/bedbound status Reviewed prior discharge summary from Dr. [place] Service complicated history of bilateral hip repairs-severe fear of falling and injuring himself-and apparently his chosen to be bedridden.  Per patient report-he has been bedbound since February 2024.    DVT prophylaxis: lovenox Code Status: full Family Communication: none Disposition:   Status is: Inpatient Remains inpatient appropriate because: need for ortho, ID consults   Consultants:  Ortho ID  Procedures:  none  Antimicrobials:  Anti-infectives (From admission, onward)    Start     Dose/Rate Route Frequency Ordered Stop   06/21/23 2200  metroNIDAZOLE (FLAGYL) tablet 500 mg        500 mg Oral Every 12 hours 06/21/23 0901     06/21/23 1415  DAPTOmycin (CUBICIN) IVPB 700 mg/177mL premix        8 mg/kg  88.5 kg 200 mL/hr over 30  Minutes Intravenous Daily 06/21/23 1318     06/20/23 0300  vancomycin (VANCOREADY) IVPB 1500 mg/300 mL  Status:  Discontinued        1,500 mg 150 mL/hr over 120 Minutes  Intravenous Every 12 hours 06/19/23 2040 06/21/23 1318   06/19/23 1615  cefTRIAXone (ROCEPHIN) 2 g in sodium chloride 0.9 % 100 mL IVPB        2 g 200 mL/hr over 30 Minutes Intravenous Every 24 hours 06/19/23 1608     06/19/23 1615  metroNIDAZOLE (FLAGYL) IVPB 500 mg  Status:  Discontinued        500 mg 100 mL/hr over 60 Minutes Intravenous Every 12 hours 06/19/23 1608 06/21/23 0901   06/19/23 1430  vancomycin (VANCOCIN) IVPB 1000 mg/200 mL premix  Status:  Discontinued        1,000 mg 200 mL/hr over 60 Minutes Intravenous NOW 06/19/23 1417 06/19/23 1426   06/19/23 1430  piperacillin-tazobactam (ZOSYN) IVPB 3.375 g        3.375 g 12.5 mL/hr over 240 Minutes Intravenous NOW 06/19/23 1417 06/19/23 1520   06/19/23 1430  vancomycin (VANCOCIN) IVPB 1000 mg/200 mL premix  Status:  Discontinued       Placed in "And" Linked Group   1,000 mg 200 mL/hr over 60 Minutes Intravenous  Once 06/19/23 1426 06/19/23 2040   06/19/23 1430  vancomycin (VANCOCIN) IVPB 1000 mg/200 mL premix       Placed in "And" Linked Group   1,000 mg 200 mL/hr over 60 Minutes Intravenous  Once 06/19/23 1426 06/19/23 1539       Subjective: C/o itching, asking for more food  Objective: Vitals:   06/20/23 1942 06/20/23 2351 06/21/23 0418 06/21/23 0740  BP: 121/70 112/75 129/80 135/83  Pulse: 99 98 94 94  Resp: 18 18 18 17   Temp: 98.4 F (36.9 C) 98.1 F (36.7 C) 98.1 F (36.7 C) 97.6 F (36.4 C)  TempSrc:    Oral  SpO2: 100% 100% 100% 94%  Weight:   88.5 kg   Height:        Intake/Output Summary (Last 24 hours) at 06/21/2023 1435 Last data filed at 06/21/2023 0948 Gross per 24 hour  Intake 1085.94 ml  Output 2100 ml  Net -1014.06 ml   Filed Weights   06/19/23 1247 06/21/23 0418  Weight: 88.5 kg 88.5 kg    Examination:  General: No acute distress. Lungs: unlabored Neurological: Alert and oriented 3. Moves all extremities 4 with equal strength. Cranial nerves II through XII grossly  intact. Extremities: LLE wound dressed  Data Reviewed: I have personally reviewed following labs and imaging studies  CBC: Recent Labs  Lab 06/19/23 1353 06/19/23 1759 06/20/23 0624 06/21/23 0551  WBC 6.8 6.8 6.7 4.9  NEUTROABS  --   --   --  2.5  HGB 13.1 12.7* 12.7* 12.3*  HCT 41.1 40.0 39.6 38.7*  MCV 91.9 92.0 90.6 91.5  PLT 362 377 346 329    Basic Metabolic Panel: Recent Labs  Lab 06/19/23 1803 06/20/23 0624 06/21/23 0551  NA 134* 130* 132*  K 4.0 3.8 3.9  CL 98 95* 98  CO2 22 22 21*  GLUCOSE 149* 202* 157*  BUN 18 20 19   CREATININE 0.89 0.92 0.64  CALCIUM 9.0 8.9 8.6*  MG  --   --  2.2  PHOS  --   --  3.9    GFR: Estimated Creatinine Clearance: 104.5 mL/min (by C-G formula based on SCr of 0.64 mg/dL).  Liver Function Tests: Recent Labs  Lab 06/20/23 0624 06/21/23 0551  AST 12* 13*  ALT 12 10  ALKPHOS 61 55  BILITOT 0.4 0.5  PROT 7.4 6.9  ALBUMIN 2.9* 2.8*    CBG: Recent Labs  Lab 06/20/23 1145 06/20/23 1610 06/20/23 1944 06/21/23 0740 06/21/23 1143  GLUCAP 177* 178* 159* 142* 150*     Recent Results (from the past 240 hour(s))  Blood culture (routine x 2)     Status: None (Preliminary result)   Collection Time: 06/19/23  1:12 PM   Specimen: BLOOD  Result Value Ref Range Status   Specimen Description BLOOD RIGHT ANTECUBITAL  Final   Special Requests   Final    BOTTLES DRAWN AEROBIC ONLY Blood Culture adequate volume   Culture   Final    NO GROWTH 2 DAYS Performed at [facility], [address] [place], [place] [address]    Report Status PENDING  Incomplete  Blood culture (routine x 2)     Status: None (Preliminary result)   Collection Time: 06/19/23  1:53 PM   Specimen: BLOOD LEFT ARM  Result Value Ref Range Status   Specimen Description BLOOD LEFT ARM  Final   Special Requests   Final    BOTTLES DRAWN AEROBIC AND ANAEROBIC Blood Culture results may not be optimal due to an inadequate volume of blood received in  culture bottles   Culture   Final    NO GROWTH 2 DAYS Performed at [facility], [address], [place], [place] [address]    Report Status PENDING  Incomplete         Radiology Studies: MR ANKLE LEFT W WO CONTRAST  Result Date: 06/19/2023 CLINICAL DATA:  Osteomyelitis.  Left posterolateral ankle wound. EXAM: MRI OF THE LEFT ANKLE WITHOUT AND WITH CONTRAST TECHNIQUE: Multiplanar, multisequence MR imaging of the ankle was performed before and after the administration of intravenous contrast. CONTRAST:  9mL GADAVIST GADOBUTROL 1 MMOL/ML IV SOLN COMPARISON:  Radiographs 06/19/2023 FINDINGS: TENDONS Peroneal: Unremarkable Posteromedial: Distal tibialis posterior tendinopathy. Anterior: Unremarkable Achilles: Unremarkable Plantar Fascia: Mildly accentuated signal in the medial band of the plantar fascia for example on image 30 series 8 compatible with plantar fasciitis. LIGAMENTS Lateral: Thin/attenuated anterior talofibular ligament likely from Shabana Armentrout remote injury. Medial: Unremarkable CARTILAGE Ankle Joint: Degenerative thinning of the articular cartilage. Subtalar Joints/Sinus Tarsi: Chondral thinning medially in the posterior subtalar joint. Bones: There is active osteomyelitis of the fibular diaphysis extending to the distal metaphysis. Centered about 12.7 cm proximal to the distal fibular tip, there is Leaner Morici large posterolateral ulceration of the cutaneous and subcutaneous tissues with the ulcer crater extending to the periosteal margin of the fibula (image 14, series 7) and potentially exposing the bony surface of the fibula. In addition to abnormal edema and enhancement throughout the fibular shaft and distal metaphysis, there is bony demineralization and thinning of the cortex tangential to this ulceration, and surrounding inflammatory edema extending in adjacent muscle groups including the extensor digitorum, peroneus, and flexor hallucis longus muscle groups. This likely represents myositis  adjacent to the osteomyelitis. I do not see Deshondra Worst drainable abscess in the soft tissues. There is some endosteal edema in the distal tibia, probably reactive in this context rather than representing concomitant osteomyelitis. Posteriorly in the calcaneus as on image 10 of series 4 there is abnormal edema and mild enhancement as on image 35 series 11 suspicious for early osteomyelitis. Dorsal spurring at the talonavicular joint. Other: No supplemental non-categorized  findings. IMPRESSION: IMPRESSION 1. Large posterolateral ulceration of the lower leg cutaneous and subcutaneous tissues with the ulcer crater extending to the periosteal margin of the fibular diaphysis and potentially exposing the bony surface of the fibula. There is active osteomyelitis of the fibular shaft and distal metaphysis, with surrounding myositis. 2. There is some endosteal edema in the distal tibia, probably reactive in this context rather than representing concomitant osteomyelitis. 3. There is abnormal edema and mild enhancement posteriorly in the calcaneus suspicious for early osteomyelitis. 4. Distal tibialis posterior tendinopathy. 5. Mild plantar fasciitis. 6. Thin/attenuated anterior talofibular ligament likely from Jatavious Peppard remote injury. 7. Degenerative thinning of the articular cartilage in the ankle and posterior subtalar joint. 8. Dorsal spurring at the talonavicular joint. Electronically Signed   By: Gaylyn Rong M.D.   On: 06/19/2023 16:56        Scheduled Meds:  aspirin EC  81 mg Oral Daily   empagliflozin  10 mg Oral Daily   enoxaparin (LOVENOX) injection  40 mg Subcutaneous Q24H   feeding supplement  237 mL Oral BID BM   fludrocortisone  0.2 mg Oral Daily   Gerhardt's butt cream   Topical BID   insulin aspart  0-9 Units Subcutaneous TID WC   insulin detemir  20 Units Subcutaneous QHS   metroNIDAZOLE  500 mg Oral Q12H   polyethylene glycol  17 g Oral Daily   rosuvastatin  5 mg Oral Once per day on Monday Thursday    senna  1 tablet Oral BID   sodium chloride flush  3 mL Intravenous Q12H   ticagrelor  90 mg Oral BID   Continuous Infusions:  sodium chloride     cefTRIAXone (ROCEPHIN)  IV 2 g (06/19/23 1806)   DAPTOmycin 700 mg (06/21/23 1415)     LOS: 2 days    Time spent: over 30 min    Lacretia Nicks, MD [facility]   To contact the attending provider between 7A-7P or the covering provider during after hours 7P-7A, please log into the web site [email or web address] and access using universal [place] password for that web site. If you do not have the password, please call the hospital operator.  06/21/2023, 2:35 PM

## 2023-06-21 NOTE — Progress Notes (Signed)
Initial Nutrition Assessment  DOCUMENTATION CODES:   Non-severe (moderate) malnutrition in context of chronic illness  INTERVENTION:  Ensure BID Liberalize diet   NUTRITION DIAGNOSIS:   Moderate Malnutrition related to chronic illness as evidenced by moderate fat depletion, moderate muscle depletion.    GOAL:   Patient will meet greater than or equal to 90% of their needs    MONITOR:   PO intake, Supplement acceptance, Labs, Weight trends  REASON FOR ASSESSMENT:   Malnutrition Screening Tool    ASSESSMENT:  72 y.o. M, Admitted 10/2 Osteomyelitis. PMH; CAD s/p PCI April 2024, PAD, DM-2, DVT, Cancer (melanoma).  8/7-8/17- Polymicrobial bacteremia discharged home with IV antibiotics. Pt aggressively eating a sandwich at time of visit reports no appetite changes. Said he was starving. He did not feel as though he was getting enough to eat due to diet restrictions.There is no benefit to excessive dietary restriction related to advance aged and increased nutrient needs.  Discussed  liberalizing diet and he was in agreement. Further agreed to supplement while in the hospital setting.   Admit weight: 88.5 Current weight: 88.5  Weight History: 06/21/23 88.5 kg  04/24/23 86.2 kg  04/19/23 85.4 kg  01/20/23 81.5 kg  10/01/22 86.2 kg  08/23/22 79.9 kg     Average Meal Intake: 75-100: 95% intake x 3 recorded meals  Nutritionally Relevant Medications: Scheduled Meds:  aspirin EC  81 mg Oral Daily   empagliflozin  10 mg Oral Daily   enoxaparin (LOVENOX) injection  40 mg Subcutaneous Q24H   fludrocortisone  0.2 mg Oral Daily   Gerhardt's butt cream   Topical BID   insulin aspart  0-9 Units Subcutaneous TID WC   insulin detemir  20 Units Subcutaneous QHS   metroNIDAZOLE  500 mg Oral Q12H   polyethylene glycol  17 g Oral Daily   rosuvastatin  5 mg Oral Once per day on Monday Thursday   senna  1 tablet Oral BID   sodium chloride flush  3 mL Intravenous Q12H   ticagrelor   90 mg Oral BID    Continuous Infusions:  sodium chloride     cefTRIAXone (ROCEPHIN)  IV 2 g (06/19/23 1806)   DAPTOmycin     PRN Meds:.sodium chloride, acetaminophen **OR** acetaminophen, albuterol, bisacodyl, fentaNYL (SUBLIMAZE) injection, hydrOXYzine, ondansetron **OR** ondansetron (ZOFRAN) IV, oxyCODONE, sodium chloride flush  Labs Reviewed    NUTRITION - FOCUSED PHYSICAL EXAM:  Flowsheet Row Most Recent Value  Orbital Region Moderate depletion  Upper Arm Region Moderate depletion  Thoracic and Lumbar Region Moderate depletion  Buccal Region Moderate depletion  Temple Region Severe depletion  Clavicle Bone Region Severe depletion  Clavicle and Acromion Bone Region Severe depletion  Scapular Bone Region Unable to assess  Dorsal Hand Severe depletion  Patellar Region Moderate depletion  Anterior Thigh Region Moderate depletion  Posterior Calf Region Moderate depletion  Edema (RD Assessment) None  Hair Reviewed  Eyes Reviewed  Mouth Reviewed  Skin Reviewed  Nails Reviewed       Diet Order:   Diet Order             Diet heart healthy/carb modified Room service appropriate? Yes; Fluid consistency: Thin  Diet effective now                   EDUCATION NEEDS:   No education needs have been identified at this time  Skin:  Skin Assessment: Reviewed RN Assessment  Last BM:  10/3  Height:   Ht Readings  from Last 1 Encounters:  06/19/23 6\' 5"  (1.956 m)    Weight:   Wt Readings from Last 1 Encounters:  06/21/23 88.5 kg    Ideal Body Weight:     BMI:  Body mass index is 23.12 kg/m.  Estimated Nutritional Needs:   Kcal:  1750-2275 Kcal  Protein:  90-120 g  Fluid:  46ml/kcal    Jamelle Haring RDN, LDN Clinical Dietitian  RDN pager # available on Amion

## 2023-06-21 NOTE — Consult Note (Signed)
WOC Nurse Consult Note: patient familiar to WOC team from prior admission; being treated for fibular osteomyelitis and followed by orthopedics  Reason for Consult: L lateral leg wound  Wound type: full thickness  Pressure Injury POA: NA  Measurement: 7 cm x 3 cm x 1 cm  Wound bed:80% red moist 20% yellow stringy fibrinous material mostly to superior aspect  Drainage (amount, consistency, odor) minimal tan exudate (purulence noted in ortho note)  Periwound: erythema  Dressing procedure/placement/frequency:  Per ortho note 06/20/2023 wet to dry dressing desired Clean L lateral lower leg wound with Vashe wound cleanser Hart Rochester [phone]), apply Vashe moistened (not saturated) gauze to wound bed twice daily.  Cover with dry gauze and secure with Kerlix roll gauze or silicone foam whichever is desired.   Per orthopedics no immediate surgical indication, awaiting ABI results and may consider vascular consult if warranted.   Wound  culture obtained at MD request.  POC discussed with bedside nurse and patient. WOC team will not follow at this time. Re-consult if further needs arise.   Thank you,    Priscella Mann MSN, RN-BC, [employer] [phone]

## 2023-06-22 ENCOUNTER — Encounter (HOSPITAL_COMMUNITY): Payer: Medicare HMO

## 2023-06-22 DIAGNOSIS — M869 Osteomyelitis, unspecified: Secondary | ICD-10-CM | POA: Diagnosis not present

## 2023-06-22 LAB — PHOSPHORUS: Phosphorus: 4 mg/dL (ref 2.5–4.6)

## 2023-06-22 LAB — COMPREHENSIVE METABOLIC PANEL
ALT: 11 U/L (ref 0–44)
AST: 12 U/L — ABNORMAL LOW (ref 15–41)
Albumin: 2.7 g/dL — ABNORMAL LOW (ref 3.5–5.0)
Alkaline Phosphatase: 56 U/L (ref 38–126)
Anion gap: 9 (ref 5–15)
BUN: 22 mg/dL (ref 8–23)
CO2: 26 mmol/L (ref 22–32)
Calcium: 8.8 mg/dL — ABNORMAL LOW (ref 8.9–10.3)
Chloride: 101 mmol/L (ref 98–111)
Creatinine, Ser: 0.87 mg/dL (ref 0.61–1.24)
GFR, Estimated: >60 mL/min (ref >60)
Glucose, Bld: 212 mg/dL — ABNORMAL HIGH (ref 70–99)
Potassium: 4.1 mmol/L (ref 3.5–5.1)
Sodium: 136 mmol/L (ref 135–145)
Total Bilirubin: 0.1 mg/dL — ABNORMAL LOW (ref 0.3–1.2)
Total Protein: 6.7 g/dL (ref 6.5–8.1)

## 2023-06-22 LAB — CBC WITH DIFFERENTIAL/PLATELET
Abs Immature Granulocytes: 0.03 10*3/uL (ref 0.00–0.07)
Basophils Absolute: 0.1 10*3/uL (ref 0.0–0.1)
Basophils Relative: 1 %
Eosinophils Absolute: 0.4 10*3/uL (ref 0.0–0.5)
Eosinophils Relative: 8 %
HCT: 36.3 % — ABNORMAL LOW (ref 39.0–52.0)
Hemoglobin: 11.5 g/dL — ABNORMAL LOW (ref 13.0–17.0)
Immature Granulocytes: 1 %
Lymphocytes Relative: 31 %
Lymphs Abs: 1.4 10*3/uL (ref 0.7–4.0)
MCH: 29.2 pg (ref 26.0–34.0)
MCHC: 31.7 g/dL (ref 30.0–36.0)
MCV: 92.1 fL (ref 80.0–100.0)
Monocytes Absolute: 0.4 10*3/uL (ref 0.1–1.0)
Monocytes Relative: 9 %
Neutro Abs: 2.3 10*3/uL (ref 1.7–7.7)
Neutrophils Relative %: 50 %
Platelets: 327 10*3/uL (ref 150–400)
RBC: 3.94 MIL/uL — ABNORMAL LOW (ref 4.22–5.81)
RDW: 13.9 % (ref 11.5–15.5)
WBC: 4.5 10*3/uL (ref 4.0–10.5)
nRBC: 0 % (ref 0.0–0.2)

## 2023-06-22 LAB — GLUCOSE, CAPILLARY
Glucose-Capillary: 223 mg/dL — ABNORMAL HIGH (ref 70–99)
Glucose-Capillary: 272 mg/dL — ABNORMAL HIGH (ref 70–99)
Glucose-Capillary: 200 mg/dL — ABNORMAL HIGH (ref 70–99)
Glucose-Capillary: 235 mg/dL — ABNORMAL HIGH (ref 70–99)
Glucose-Capillary: 262 mg/dL — ABNORMAL HIGH (ref 70–99)

## 2023-06-22 LAB — MAGNESIUM: Magnesium: 2.2 mg/dL (ref 1.7–2.4)

## 2023-06-22 LAB — CK: Total CK: 25 U/L — ABNORMAL LOW (ref 49–397)

## 2023-06-22 NOTE — Progress Notes (Signed)
PROGRESS NOTE    [name].  ZOX:[id number] DOB: 04/06/1951 DOA: 06/19/2023 PCP: [name], MD  Chief Complaint  Patient presents with   Wound Infection    Brief Narrative:   Jerimi Write. is Violet Cart 72 y.o. male with medical history significant of CAD s/p PCI April 2024, PAD, DM-2, chronic hypotension, recent hospitalization from 8/7-8/17 for polymicrobial bacteremia-discharged home on IV antibiotics presented to the hospital on 10/2 with worsening pain/discharge from his wound in his left ankle area.  Per patient-he has had Kaniyah Lisby ulcer in this area of his posterior aspect of his left ankle for the past several months-he claims he developed it during his stay in rehab for hip fracture (bed was small-his legs were dangling out-and developed ulcers in bilateral posterior ankle areas-right also heel-left also worsened) .  Over the past several weeks-this has gradually worsened-but over the past few days-pain and discharge from the ulcer has markedly worsened.  He states the pain is intractable-10 out of 10-and reports foul-smelling discharge that is yellow color.  There is no history of fever. No history of nausea, vomiting, diarrhea, body aches or chills.  Per patient-he is usually nonambulatory at baseline since at least February 2024.    Assessment & Plan:   Principal Problem:   Osteomyelitis (HCC) Active Problems:   CAD (coronary artery disease)   Type 2 diabetes mellitus with hyperlipidemia (HCC)   PVD (peripheral vascular disease) (HCC)   Hyperlipidemia   Protein-calorie malnutrition, moderate (HCC)   Ambulatory dysfunction  Left diabetic foot infection Osteomyelitis  MRI with osteomyelitis of the fibular shaft and distal metaphysis with surrounding myositis - endosteal edema in distal tibia - abnormal edema and mild enhancement posteriorly in the calcaneus suspicious for early osteo (see report) Appreciate ortho recs -> no immediate surgical indication,  recommending ID consult, wet to dry dressing changes ABI pending ID consult -> deep tissue swab (gram stain with gram positive rods and gram positive cocci, final culture pending), ABI pending -- final abx regimen pending  Continue broad spectrum antibiotics    History of PAD Recent lower extremity angiogram May 2024-no significant femoropopliteal occlusive disease noted On dual antiplatelet/statin   CAD-s/p PCI to RCA April 2024 No anginal symptoms On aspirin/Brilinta/statin (suspect not on beta-blocker due to history of chronic hypotension) Appreciate cardiology assistance -> should remain on DAPT for 1 year unless plan for surgery (brilinta can be held prior to surgery if needed - 5 days prior and restarted ASAP post operatively.  Aspirin should be continued indefinitely and throughout perioperative period.  See 10/3 cardiology note.   DM-2 Levemir 20 units/SSI/Jardiance Follow CBGs and optimize   History of orthostatic hypotension Florinef   Chronic ambulatory dysfunction/nonambulatory/bedbound status Reviewed prior discharge summary from Dr. [place] Service complicated history of bilateral hip repairs-severe fear of falling and injuring himself-and apparently his chosen to be bedridden.  Per patient report-he has been bedbound since February 2024.    DVT prophylaxis: lovenox Code Status: full Family Communication: none Disposition:   Status is: Inpatient Remains inpatient appropriate because: need for ortho, ID consults   Consultants:  Ortho ID  Procedures:  none  Antimicrobials:  Anti-infectives (From admission, onward)    Start     Dose/Rate Route Frequency Ordered Stop   06/21/23 2200  metroNIDAZOLE (FLAGYL) tablet 500 mg        500 mg Oral Every 12 hours 06/21/23 0901     06/21/23 1415  DAPTOmycin (CUBICIN) IVPB 700 mg/117mL premix  8 mg/kg  88.5 kg 200 mL/hr over 30 Minutes Intravenous Daily 06/21/23 1318     06/20/23 0300  vancomycin (VANCOREADY)  IVPB 1500 mg/300 mL  Status:  Discontinued        1,500 mg 150 mL/hr over 120 Minutes Intravenous Every 12 hours 06/19/23 2040 06/21/23 1318   06/19/23 1615  cefTRIAXone (ROCEPHIN) 2 g in sodium chloride 0.9 % 100 mL IVPB        2 g 200 mL/hr over 30 Minutes Intravenous Every 24 hours 06/19/23 1608     06/19/23 1615  metroNIDAZOLE (FLAGYL) IVPB 500 mg  Status:  Discontinued        500 mg 100 mL/hr over 60 Minutes Intravenous Every 12 hours 06/19/23 1608 06/21/23 0901   06/19/23 1430  vancomycin (VANCOCIN) IVPB 1000 mg/200 mL premix  Status:  Discontinued        1,000 mg 200 mL/hr over 60 Minutes Intravenous NOW 06/19/23 1417 06/19/23 1426   06/19/23 1430  piperacillin-tazobactam (ZOSYN) IVPB 3.375 g        3.375 g 12.5 mL/hr over 240 Minutes Intravenous NOW 06/19/23 1417 06/19/23 1520   06/19/23 1430  vancomycin (VANCOCIN) IVPB 1000 mg/200 mL premix  Status:  Discontinued       Placed in "And" Linked Group   1,000 mg 200 mL/hr over 60 Minutes Intravenous  Once 06/19/23 1426 06/19/23 2040   06/19/23 1430  vancomycin (VANCOCIN) IVPB 1000 mg/200 mL premix       Placed in "And" Linked Group   1,000 mg 200 mL/hr over 60 Minutes Intravenous  Once 06/19/23 1426 06/19/23 1539       Subjective: Asking for his cream for his back   Objective: Vitals:   06/21/23 1554 06/21/23 1955 06/22/23 0420 06/22/23 0742  BP: 116/69 114/73 122/81 (!) 129/97  Pulse: 96 (!) 106 91 (!) 104  Resp: 16 18 18 17   Temp: 97.8 F (36.6 C) 98.7 F (37.1 C) 98 F (36.7 C) 98 F (36.7 C)  TempSrc:  Oral Oral Oral  SpO2: 100% 98% 100% 100%  Weight:      Height:        Intake/Output Summary (Last 24 hours) at 06/22/2023 1134 Last data filed at 06/22/2023 1012 Gross per 24 hour  Intake 773.66 ml  Output 1400 ml  Net -626.34 ml   Filed Weights   06/19/23 1247 06/21/23 0418  Weight: 88.5 kg 88.5 kg    Examination:  General: No acute distress. Cardiovascular: RRR Lungs: unlabored Neurological:  Alert and oriented 3. Moves all extremities 4 with equal strength. Cranial nerves II through XII grossly intact. Extremities: LLE wound dressed   Data Reviewed: I have personally reviewed following labs and imaging studies  CBC: Recent Labs  Lab 06/19/23 1353 06/19/23 1759 06/20/23 0624 06/21/23 0551 06/22/23 0505  WBC 6.8 6.8 6.7 4.9 4.5  NEUTROABS  --   --   --  2.5 2.3  HGB 13.1 12.7* 12.7* 12.3* 11.5*  HCT 41.1 40.0 39.6 38.7* 36.3*  MCV 91.9 92.0 90.6 91.5 92.1  PLT 362 377 346 329 327    Basic Metabolic Panel: Recent Labs  Lab 06/19/23 1803 06/20/23 0624 06/21/23 0551 06/22/23 0505  NA 134* 130* 132* 136  K 4.0 3.8 3.9 4.1  CL 98 95* 98 101  CO2 22 22 21* 26  GLUCOSE 149* 202* 157* 212*  BUN 18 20 19 22   CREATININE 0.89 0.92 0.64 0.87  CALCIUM 9.0 8.9 8.6* 8.8*  MG  --   --  2.2 2.2  PHOS  --   --  3.9 4.0    GFR: Estimated Creatinine Clearance: 96.1 mL/min (by C-G formula based on SCr of 0.87 mg/dL).  Liver Function Tests: Recent Labs  Lab 06/20/23 0624 06/21/23 0551 06/22/23 0505  AST 12* 13* 12*  ALT 12 10 11   ALKPHOS 61 55 56  BILITOT 0.4 0.5 0.1*  PROT 7.4 6.9 6.7  ALBUMIN 2.9* 2.8* 2.7*    CBG: Recent Labs  Lab 06/21/23 1143 06/21/23 1641 06/21/23 1955 06/22/23 0741 06/22/23 1127  GLUCAP 150* 203* 263* 223* 272*     Recent Results (from the past 240 hour(s))  Blood culture (routine x 2)     Status: None (Preliminary result)   Collection Time: 06/19/23  1:12 PM   Specimen: BLOOD  Result Value Ref Range Status   Specimen Description BLOOD RIGHT ANTECUBITAL  Final   Special Requests   Final    BOTTLES DRAWN AEROBIC ONLY Blood Culture adequate volume   Culture   Final    NO GROWTH 3 DAYS Performed at [facility], [address] [place], [place] [address]    Report Status PENDING  Incomplete  Blood culture (routine x 2)     Status: None (Preliminary result)   Collection Time: 06/19/23  1:53 PM   Specimen: BLOOD LEFT  ARM  Result Value Ref Range Status   Specimen Description BLOOD LEFT ARM  Final   Special Requests   Final    BOTTLES DRAWN AEROBIC AND ANAEROBIC Blood Culture results may not be optimal due to an inadequate volume of blood received in culture bottles   Culture   Final    NO GROWTH 3 DAYS Performed at [facility], [address] [place], [place] [address]    Report Status PENDING  Incomplete  Aerobic Culture w Gram Stain (superficial specimen)     Status: None (Preliminary result)   Collection Time: 06/21/23 10:31 AM   Specimen: Wound  Result Value Ref Range Status   Specimen Description WOUND  Final   Special Requests LEFT LEG  Final   Gram Stain   Final    NO WBC SEEN RARE GRAM POSITIVE RODS RARE GRAM POSITIVE COCCI IN PAIRS    Culture   Final    CULTURE REINCUBATED FOR BETTER GROWTH Performed at [facility], [address] [place], [place] [address]    Report Status PENDING  Incomplete         Radiology Studies: No results found.      Scheduled Meds:  aspirin EC  81 mg Oral Daily   empagliflozin  10 mg Oral Daily   enoxaparin (LOVENOX) injection  40 mg Subcutaneous Q24H   feeding supplement  237 mL Oral BID BM   fludrocortisone  0.2 mg Oral Daily   Gerhardt's butt cream   Topical BID   insulin aspart  0-9 Units Subcutaneous TID WC   insulin detemir  20 Units Subcutaneous QHS   metroNIDAZOLE  500 mg Oral Q12H   polyethylene glycol  17 g Oral Daily   rosuvastatin  5 mg Oral Once per day on Monday Thursday   senna  1 tablet Oral BID   sodium chloride flush  3 mL Intravenous Q12H   ticagrelor  90 mg Oral BID   Continuous Infusions:  sodium chloride     cefTRIAXone (ROCEPHIN)  IV 2 g (06/21/23 1719)   DAPTOmycin 700 mg (06/21/23 1415)     LOS:  3 days    Time spent: over 30 min    Lacretia Nicks, MD [facility]   To contact the attending provider between 7A-7P or the covering provider during after hours 7P-7A, please log  into the web site [email or web address] and access using universal [place] password for that web site. If you do not have the password, please call the hospital operator.  06/22/2023, 11:34 AM

## 2023-06-22 NOTE — Evaluation (Signed)
Occupational Therapy Evaluation & Discharge Patient Details Name: Micheal Moore. MRN: [id number] DOB: Feb 13, 1951 Today's Date: 06/22/2023   History of Present Illness Micheal Moore. is a 72 y.o. male who  presented to the hospital on 10/2 with worsening pain/discharge from his wound in his left ankle area. Admitted for Osteomyelitis. Multiple recent admissions: 8/7-8/17 for polymicrobial bacteremia-discharged home on IV antibiotics and 7/24 - 8/5 due to bacteremia secondary to MRSA and Providencia, complicated by PNA. PMHx: CAD s/p PCI, syncope, orthostatic hypotension, diabetes mellitus type 2, DVT of right leg, peripheral artery disease, right elbow melanoma s/p excision, L THA x2 (due to complications)   Clinical Impression   Brent was evaluated s/p the above admission list. He is bedbound and his wife assists with all aspects of his care at baseline. Since recent admit/discharge, pt got OOB to [facility] with total A +2 for appointments only. Upon evaluation the pt was limited by baseline immobility, multiple skin tears, painful LLE and poor activity tolerance. Overall he needed up to mod A for bed mobility, and declined standing attempt despite having +2 help. He also needs set up - min A for UB ADLs at bed level and max-total A for LB ADLs. Pt is likely at his functional baseline with no acute OT needs identified. Pt will benefit from d/c home with maximal HH services to reduce burden of care.        If plan is discharge home, recommend the following: Two people to help with walking and/or transfers;A lot of help with bathing/dressing/bathroom;Assistance with cooking/housework;Direct supervision/assist for medications management;Direct supervision/assist for financial management;Assist for transportation;Help with stairs or ramp for entrance    Functional Status Assessment  Patient has had a recent decline in their functional status and/or demonstrates limited ability to make  significant improvements in function in a reasonable and predictable amount of time  Equipment Recommendations  Other (comment)       Precautions / Restrictions Precautions Precautions: Fall Restrictions Weight Bearing Restrictions: No      Mobility Bed Mobility Overal bed mobility: Needs Assistance Bed Mobility: Rolling, Sidelying to Sit, Sit to Supine Rolling: Min assist Sidelying to sit: Mod assist   Sit to supine: Mod assist        Transfers Overall transfer level: Needs assistance                 General transfer comment: declined      Balance Overall balance assessment: Needs assistance Sitting-balance support: Single extremity supported, Feet supported Sitting balance-[name]: Poor Sitting balance - Comments: posterior bais due to poor spinal and hip mobility                                   ADL either performed or assessed with clinical judgement   ADL Overall ADL's : At baseline                                       General ADL Comments: set up - min A for all UB tasks at bed level, able to roll L&R with min to assist with LB ADLs     Vision Baseline Vision/History: 1 Wears glasses Vision Assessment?: No apparent visual deficits     Perception Perception: Not tested       Praxis Praxis: Not tested  Pertinent Vitals/Pain Pain Assessment Pain Assessment: Faces Faces Pain Scale: Hurts little more Pain Location: L ankle, back Pain Descriptors / Indicators: Aching Pain Intervention(s): Limited activity within patient's tolerance, Monitored during session     Extremity/Trunk Assessment Upper Extremity Assessment Upper Extremity Assessment: Generalized weakness   Lower Extremity Assessment Lower Extremity Assessment: Defer to PT evaluation   Cervical / Trunk Assessment Cervical / Trunk Assessment: Other exceptions Cervical / Trunk Exceptions: skin integrity impiarments at back, bottom and  thighs. limited spinal and hip mobility   Communication Communication Communication: No apparent difficulties   Cognition Arousal: Alert Behavior During Therapy: WFL for tasks assessed/performed Overall Cognitive Status: No family/caregiver present to determine baseline cognitive functioning                                 General Comments: likely baseline, very particular about his bed pads. needs encouragement to participate.     General Comments  VSS            Home Living Family/patient expects to be discharged to:: Private residence Living Arrangements: Spouse/significant other Available Help at Discharge: Family;Available 24 hours/day Type of Home: House Home Access: Stairs to enter [employer] of Steps: 2 to the poarch and one from porch into home Entrance Stairs-Rails: None Home Layout: One level     Bathroom Shower/Tub: Chief Strategy Officer: Standard     Home Equipment: Agricultural consultant (2 wheels);Wheelchair - manual;Hospital bed   Additional Comments: continues to decline hoyer      Prior Functioning/Environment Prior Level of Function : Needs assist             Mobility Comments: Bedbound since 2023, 2 men pick him up and put him in the [facility]. He only gets OOB for appointments ADLs Comments: assists at bed level, wife completes most of his care        OT Problem List: Decreased strength;Decreased range of motion;Decreased activity tolerance;Impaired balance (sitting and/or standing);Decreased safety awareness;Decreased knowledge of use of DME or AE;Decreased knowledge of precautions         OT Goals(Current goals can be found in the care plan section) Acute Rehab OT Goals Patient Stated Goal: to go home OT Goal Formulation: With patient Time For Goal Achievement: 06/22/23 Potential to Achieve Goals: Good   AM-PAC OT "6 Clicks" Daily Activity     Outcome Measure Help from another person eating meals?:  None Help from another person taking care of personal grooming?: A Little Help from another person toileting, which includes using toliet, bedpan, or urinal?: Total Help from another person bathing (including washing, rinsing, drying)?: A Lot Help from another person to put on and taking off regular upper body clothing?: A Little Help from another person to put on and taking off regular lower body clothing?: Total 6 Click Score: 14   End of Session Nurse Communication: Mobility status  Activity Tolerance: Patient tolerated treatment well Patient left: in bed;with call bell/phone within reach;with bed alarm set  OT Visit Diagnosis: Other abnormalities of gait and mobility (R26.89);Muscle weakness (generalized) (M62.81);History of falling (Z91.81);Repeated falls (R29.6)                Time: 4098-1191 OT Time Calculation (min): 18 min Charges:  OT General Charges $OT Visit: 1 Visit OT Evaluation $OT Eval Low Complexity: 1 Low  Derenda Mis, OTR/L Acute Rehabilitation Services Office [phone] Secure Chat Communication Preferred  Donia Pounds 06/22/2023, 2:27 PM

## 2023-06-22 NOTE — Evaluation (Addendum)
Physical Therapy Evaluation Patient Details Name: Micheal Moore. MRN: [id number] DOB: Nov 04, 1950 Today's Date: 06/22/2023  History of Present Illness  [name] is a 72 y.o. male who  presented to the hospital on 10/2 with worsening pain/discharge from his wound in his left ankle area. Admitted for Osteomyelitis. Multiple recent admissions: 8/7-8/17 for polymicrobial bacteremia-discharged home on IV antibiotics and 7/24 - 8/5 due to bacteremia secondary to MRSA and Providencia, complicated by PNA. PMHx: CAD s/p PCI, syncope, orthostatic hypotension, diabetes mellitus type 2, DVT of right leg, peripheral artery disease, right elbow melanoma s/p excision, L THA x2 (due to complications)  Clinical Impression  Pt admitted with above diagnosis. Pt was able to sit EOB with CGA to min assist with pt with anterior flexion of trunk and hips with posterior lean at times needing constant support at EOB. Does not tolerate sitting long. Pt with goal of sitting in wheelchair at home therefore will give PT trial for working on sitting balance and posture. Recommend hoyer lift for home as pt has been here recently and wife has been taking him home with services at home.  Pt reports it took 2 persons to get him into wheelchair PTA. Pt currently with functional limitations due to the deficits listed below (see PT Problem List). Pt will benefit from acute skilled PT to increase their independence and safety with mobility to allow discharge.           If plan is discharge home, recommend the following: Two people to help with walking and/or transfers;A lot of help with bathing/dressing/bathroom;Assistance with cooking/housework;Assist for transportation;Help with stairs or ramp for entrance   Can travel by private vehicle   No    Equipment Recommendations Hoyer lift  Recommendations for Other Services       Functional Status Assessment Patient has had a recent decline in their functional  status and demonstrates the ability to make significant improvements in function in a reasonable and predictable amount of time.     Precautions / Restrictions Precautions Precautions: Fall Precaution Comments: multiple pressure injuries, back, heels Restrictions Weight Bearing Restrictions: No      Mobility  Bed Mobility Overal bed mobility: Needs Assistance Bed Mobility: Rolling, Sidelying to Sit, Sit to Supine Rolling: Min assist Sidelying to sit: Mod assist   Sit to supine: Mod assist   General bed mobility comments: Heavy use of bed rails and bed controls, pt admits he is moving better than he did at home    Transfers Overall transfer level: Needs assistance                 General transfer comment: declined    Ambulation/Gait                  Stairs            Wheelchair Mobility     Tilt Bed    Modified Rankin (Stroke Patients Only)       Balance Overall balance assessment: Needs assistance Sitting-balance support: Single extremity supported, Feet supported Sitting balance-[name]: Poor Sitting balance - Comments: posterior bais due to poor spinal and hip mobility                                     Pertinent Vitals/Pain Pain Assessment Pain Assessment: Faces Faces Pain Scale: Hurts little more Pain Location: L ankle, back Pain Descriptors / Indicators:  Aching Pain Intervention(s): Limited activity within patient's tolerance, Monitored during session, Repositioned    Home Living Family/patient expects to be discharged to:: Private residence Living Arrangements: Spouse/significant other Available Help at Discharge: Family;Available 24 hours/day Type of Home: House Home Access: Stairs to enter Entrance Stairs-Rails: None Entrance Stairs-Number of Steps: 2 to the poarch and one from porch into home   Home Layout: One level Home Equipment: Agricultural consultant (2 wheels);Wheelchair - manual;Hospital  bed Additional Comments: continues to decline hoyer    Prior Function Prior Level of Function : Needs assist             Mobility Comments: Bedbound since 2023, 2 men pick him up and put him in the [facility]. He only gets OOB for appointments ADLs Comments: assists at bed level, wife completes most of his care     Extremity/Trunk Assessment   Upper Extremity Assessment Upper Extremity Assessment: Defer to OT evaluation    Lower Extremity Assessment Lower Extremity Assessment: RLE deficits/detail;LLE deficits/detail RLE Deficits / Details: knee and hip flexion limited to 90 degrees grossly with 2/5 strength, able to achieve neutral ankle but cannot dorsiflex, 2+/5 hip Add/Abduct with total ROM grossly 20 degrees LLE Deficits / Details: AAROM hip and knee ~15 degrees flexion; hip flexion 2+ can perform partial SLR, knee flexion requiring physical assist. ankle unable to acheive neutral. 2/5 hip abduct/add    Cervical / Trunk Assessment Cervical / Trunk Assessment: Other exceptions Cervical / Trunk Exceptions: skin integrity impiarments at back, bottom and thighs. limited spinal and hip mobility  Communication   Communication Communication: No apparent difficulties  Cognition Arousal: Alert Behavior During Therapy: WFL for tasks assessed/performed Overall Cognitive Status: No family/caregiver present to determine baseline cognitive functioning                                 General Comments: likely baseline, very particular about his bed pads. needs encouragement to participate.        General Comments General comments (skin integrity, edema, etc.): VSS    Exercises General Exercises - Lower Extremity Hip Flexion/Marching: AAROM, Both, 10 reps, Supine   Assessment/Plan    PT Assessment Patient needs continued PT services  PT Problem List Decreased strength;Decreased range of motion;Decreased activity tolerance;Decreased mobility;Decreased balance;Decreased  knowledge of use of DME;Pain       PT Treatment Interventions Therapeutic activities;Therapeutic exercise;Patient/family education    PT Goals (Current goals can be found in the Care Plan section)  Acute Rehab PT Goals Patient Stated Goal: to go home with caregivers PT Goal Formulation: With patient Time For Goal Achievement: 07/06/23 Potential to Achieve Goals: Fair    Frequency Min 1X/week     Co-evaluation PT/OT/SLP Co-Evaluation/Treatment: Yes Reason for Co-Treatment: Complexity of the patient's impairments (multi-system involvement);For patient/therapist safety PT goals addressed during session: Mobility/safety with mobility         AM-PAC PT "6 Clicks" Mobility  Outcome Measure Help needed turning from your back to your side while in a flat bed without using bedrails?: A Little Help needed moving from lying on your back to sitting on the side of a flat bed without using bedrails?: A Lot Help needed moving to and from a bed to a chair (including a wheelchair)?: Total Help needed standing up from a chair using your arms (e.g., wheelchair or bedside chair)?: Total Help needed to walk in hospital room?: Total Help needed climbing 3-5 steps with a  railing? : Total 6 Click Score: 9    End of Session   Activity Tolerance: Patient tolerated treatment well Patient left: in bed;with call bell/phone within reach;with bed alarm set Nurse Communication: Mobility status;Need for lift equipment PT Visit Diagnosis: Repeated falls (R29.6);Muscle weakness (generalized) (M62.81);Other abnormalities of gait and mobility (R26.89)    Time: 3664-4034 PT Time Calculation (min) (ACUTE ONLY): 18 min   Charges:   PT Evaluation $PT Eval Moderate Complexity: 1 Mod   PT General Charges $$ ACUTE PT VISIT: 1 Visit         Arash Karstens M,PT Acute Rehab Services [phone]   Bevelyn Buckles 06/22/2023, 3:29 PM

## 2023-06-22 NOTE — Plan of Care (Signed)
  Problem: Activity: Goal: Ability to return to baseline activity level will improve Outcome: Progressing   Problem: Cardiac: Goal: Ability to maintain an adequate cardiac output will improve Outcome: Progressing   Problem: Metabolic: Goal: Ability to maintain appropriate glucose levels will improve Outcome: Progressing   Problem: Respiratory: Goal: Will regain and/or maintain adequate ventilation Outcome: Progressing   Problem: Activity: Goal: Ability to avoid complications of mobility impairment will improve Outcome: Progressing

## 2023-06-23 DIAGNOSIS — M869 Osteomyelitis, unspecified: Secondary | ICD-10-CM | POA: Diagnosis not present

## 2023-06-23 LAB — GLUCOSE, CAPILLARY
Glucose-Capillary: 263 mg/dL — ABNORMAL HIGH (ref 70–99)
Glucose-Capillary: 267 mg/dL — ABNORMAL HIGH (ref 70–99)
Glucose-Capillary: 196 mg/dL — ABNORMAL HIGH (ref 70–99)
Glucose-Capillary: 189 mg/dL — ABNORMAL HIGH (ref 70–99)

## 2023-06-23 MED ORDER — SODIUM CHLORIDE 0.9 % IV SOLN
2.0000 g | Freq: Three times a day (TID) | INTRAVENOUS | Status: DC
  Administered 2023-06-23 – 2023-06-24 (×3): 2 g via INTRAVENOUS
  Filled 2023-06-23 (×3): qty 12.5

## 2023-06-23 MED ORDER — INSULIN ASPART 100 UNIT/ML IJ SOLN
3.0000 [IU] | Freq: Three times a day (TID) | INTRAMUSCULAR | Status: DC
  Administered 2023-06-23 – 2023-06-24 (×5): 3 [IU] via SUBCUTANEOUS

## 2023-06-23 NOTE — Progress Notes (Signed)
Pharmacy Antibiotic Note  Micheal Moore. is a 72 y.o. male admitted on 06/19/2023 with  L leg osteomyelitis  and history of MRSA bacteremia.  Continues on daptomycin and metronidazole. Pharmacy consulted to begin cefepime. L leg wound growing few Staph aureus and rare Pseudomonas - sensitivities pending. Renal function is stable. Afebrile, WBC are normal.  Plan: Cefepime 2 g IV q8h Monitor renal function, clinical progress, cultures/sensitivities F/U LOT and de-escalate as able ID following   Height: 6\' 5"  (195.6 cm) Weight: 88.5 kg (195 lb) IBW/kg (Calculated) : 89.1  Temp (24hrs), Avg:98 F (36.7 C), Min:97.3 F (36.3 C), Max:98.6 F (37 C)  Recent Labs  Lab 06/19/23 1353 06/19/23 1417 06/19/23 1759 06/19/23 1803 06/20/23 0624 06/21/23 0551 06/22/23 0505  WBC 6.8  --  6.8  --  6.7 4.9 4.5  CREATININE  --   --   --  0.89 0.92 0.64 0.87  LATICACIDVEN  --  1.7  --   --   --   --   --     Estimated Creatinine Clearance: 96.1 mL/min (by C-G formula based on SCr of 0.87 mg/dL).    No Known Allergies  Antimicrobials this admission: Zosyn x1 10/2 Vancomycin 10/2 >> 10/4 Ceftriaxone 10/2 >> 10/5 Metronidazole 10/2 >> Daptomycin 10/4 >> Cefepime 10/6 >>  Microbiology results: 10/2 BCx: ngtd 10/4 L leg wound: few Staph aureus, rare Pseudomonas  Thank you for involving pharmacy in this patient's care.  Loura Back, PharmD, BCPS Clinical Pharmacist Clinical phone for 06/23/2023 is [phone] 06/23/2023 3:27 PM

## 2023-06-23 NOTE — Plan of Care (Signed)
Id brief note  Cx tissue with pseudomonas and staph aureus   Switching abx to dapto and cefepime Id team tomorrow to finalize recs

## 2023-06-23 NOTE — Progress Notes (Signed)
PROGRESS NOTE    Micheal Moore.  OAC:[id number] DOB: [birth year] DOA: 06/19/2023 PCP: [name], MD  Chief Complaint  Patient presents with   Wound Infection    Brief Narrative:   Micheal Colindres. is Micheal Moore 72 y.o. male with medical history significant of CAD s/p PCI April 2024, PAD, DM-2, chronic hypotension, recent hospitalization from 8/7-8/17 for polymicrobial bacteremia-discharged home on IV antibiotics presented to the hospital on 10/2 with worsening pain/discharge from his wound in his left ankle area.  Per patient-he has had Debanhi Blaker ulcer in this area of his posterior aspect of his left ankle for the past several months-he claims he developed it during his stay in rehab for hip fracture (bed was small-his legs were dangling out-and developed ulcers in bilateral posterior ankle areas-right also heel-left also worsened) .  Over the past several weeks-this has gradually worsened-but over the past few days-pain and discharge from the ulcer has markedly worsened.  He states the pain is intractable-10 out of 10-and reports foul-smelling discharge that is yellow color.  There is no history of fever. No history of nausea, vomiting, diarrhea, body aches or chills.  Per patient-he is usually nonambulatory at baseline since at least February 2024.    Assessment & Plan:   Principal Problem:   Osteomyelitis (HCC) Active Problems:   CAD (coronary artery disease)   Type 2 diabetes mellitus with hyperlipidemia (HCC)   PVD (peripheral vascular disease) (HCC)   Hyperlipidemia   Protein-calorie malnutrition, moderate (HCC)   Ambulatory dysfunction  Left diabetic foot infection Osteomyelitis  MRI with osteomyelitis of the fibular shaft and distal metaphysis with surrounding myositis - endosteal edema in distal tibia - abnormal edema and mild enhancement posteriorly in the calcaneus suspicious for early osteo (see report) Appreciate ortho recs -> no immediate surgical indication,  recommending ID consult, wet to dry dressing changes ABI pending ID consult -> deep tissue swab (gram stain with gram positive rods and gram positive cocci in pairs, final culture pending), ABI pending -- final abx regimen pending  Continue broad spectrum antibiotics    History of PAD Recent lower extremity angiogram May 2024-no significant femoropopliteal occlusive disease noted On dual antiplatelet/statin   CAD-s/p PCI to RCA April 2024 No anginal symptoms On aspirin/Brilinta/statin (suspect not on beta-blocker due to history of chronic hypotension) Appreciate cardiology assistance -> should remain on DAPT for 1 year unless plan for surgery (brilinta can be held prior to surgery if needed - 5 days prior and restarted ASAP post operatively.  Aspirin should be continued indefinitely and throughout perioperative period.  See 10/3 cardiology note.   DM-2 Levemir 20 units/SSI/Jardiance Follow CBGs and optimize - BG's elevated, switch to carb mod diet and adjust insulin regimen as indicated   History of orthostatic hypotension Florinef   Chronic ambulatory dysfunction/nonambulatory/bedbound status Reviewed prior discharge summary from Dr. [place] Service complicated history of bilateral hip repairs-severe fear of falling and injuring himself-and apparently his chosen to be bedridden.  Per patient report-he has been bedbound since February 2024.    DVT prophylaxis: lovenox Code Status: full Family Communication: none Disposition:   Status is: Inpatient Remains inpatient appropriate because: need for ortho, ID consults   Consultants:  Ortho ID  Procedures:  none  Antimicrobials:  Anti-infectives (From admission, onward)    Start     Dose/Rate Route Frequency Ordered Stop   06/21/23 2200  metroNIDAZOLE (FLAGYL) tablet 500 mg        500 mg Oral Every 12 hours 06/21/23  0901     06/21/23 1415  DAPTOmycin (CUBICIN) IVPB 700 mg/152mL premix        8 mg/kg  88.5 kg 200 mL/hr  over 30 Minutes Intravenous Daily 06/21/23 1318     06/20/23 0300  vancomycin (VANCOREADY) IVPB 1500 mg/300 mL  Status:  Discontinued        1,500 mg 150 mL/hr over 120 Minutes Intravenous Every 12 hours 06/19/23 2040 06/21/23 1318   06/19/23 1615  cefTRIAXone (ROCEPHIN) 2 g in sodium chloride 0.9 % 100 mL IVPB        2 g 200 mL/hr over 30 Minutes Intravenous Every 24 hours 06/19/23 1608     06/19/23 1615  metroNIDAZOLE (FLAGYL) IVPB 500 mg  Status:  Discontinued        500 mg 100 mL/hr over 60 Minutes Intravenous Every 12 hours 06/19/23 1608 06/21/23 0901   06/19/23 1430  vancomycin (VANCOCIN) IVPB 1000 mg/200 mL premix  Status:  Discontinued        1,000 mg 200 mL/hr over 60 Minutes Intravenous NOW 06/19/23 1417 06/19/23 1426   06/19/23 1430  piperacillin-tazobactam (ZOSYN) IVPB 3.375 g        3.375 g 12.5 mL/hr over 240 Minutes Intravenous NOW 06/19/23 1417 06/19/23 1520   06/19/23 1430  vancomycin (VANCOCIN) IVPB 1000 mg/200 mL premix  Status:  Discontinued       Placed in "And" Linked Group   1,000 mg 200 mL/hr over 60 Minutes Intravenous  Once 06/19/23 1426 06/19/23 2040   06/19/23 1430  vancomycin (VANCOCIN) IVPB 1000 mg/200 mL premix       Placed in "And" Linked Group   1,000 mg 200 mL/hr over 60 Minutes Intravenous  Once 06/19/23 1426 06/19/23 1539       Subjective: Doesn't know what to watch on TV  Objective: Vitals:   06/22/23 1552 06/22/23 1929 06/23/23 0320 06/23/23 0829  BP: (!) 150/91 119/62 112/66 (!) 143/79  Pulse: 96 98 88 98  Resp: 16 18 18 18   Temp: (!) 97.3 F (36.3 C) 98.6 F (37 C) 98 F (36.7 C)   TempSrc: Oral Oral    SpO2: 100% 98% 98% 100%  Weight:      Height:        Intake/Output Summary (Last 24 hours) at 06/23/2023 0956 Last data filed at 06/23/2023 0826 Gross per 24 hour  Intake 237 ml  Output 3650 ml  Net -3413 ml   Filed Weights   06/19/23 1247 06/21/23 0418  Weight: 88.5 kg 88.5 kg    Examination:  General: No acute  distress. Lungs: unlabored Neurological: Alert  Extremities: dressing to LLE   Data Reviewed: I have personally reviewed following labs and imaging studies  CBC: Recent Labs  Lab 06/19/23 1353 06/19/23 1759 06/20/23 0624 06/21/23 0551 06/22/23 0505  WBC 6.8 6.8 6.7 4.9 4.5  NEUTROABS  --   --   --  2.5 2.3  HGB 13.1 12.7* 12.7* 12.3* 11.5*  HCT 41.1 40.0 39.6 38.7* 36.3*  MCV 91.9 92.0 90.6 91.5 92.1  PLT 362 377 346 329 327    Basic Metabolic Panel: Recent Labs  Lab 06/19/23 1803 06/20/23 0624 06/21/23 0551 06/22/23 0505  NA 134* 130* 132* 136  K 4.0 3.8 3.9 4.1  CL 98 95* 98 101  CO2 22 22 21* 26  GLUCOSE 149* 202* 157* 212*  BUN 18 20 19 22   CREATININE 0.89 0.92 0.64 0.87  CALCIUM 9.0 8.9 8.6* 8.8*  MG  --   --  2.2 2.2  PHOS  --   --  3.9 4.0    GFR: Estimated Creatinine Clearance: 96.1 mL/min (by C-G formula based on SCr of 0.87 mg/dL).  Liver Function Tests: Recent Labs  Lab 06/20/23 0624 06/21/23 0551 06/22/23 0505  AST 12* 13* 12*  ALT 12 10 11   ALKPHOS 61 55 56  BILITOT 0.4 0.5 0.1*  PROT 7.4 6.9 6.7  ALBUMIN 2.9* 2.8* 2.7*    CBG: Recent Labs  Lab 06/22/23 1127 06/22/23 1707 06/22/23 1726 06/22/23 1930 06/23/23 0827  GLUCAP 272* 200* 235* 262* 263*     Recent Results (from the past 240 hour(s))  Blood culture (routine x 2)     Status: None (Preliminary result)   Collection Time: 06/19/23  1:12 PM   Specimen: BLOOD  Result Value Ref Range Status   Specimen Description BLOOD RIGHT ANTECUBITAL  Final   Special Requests   Final    BOTTLES DRAWN AEROBIC ONLY Blood Culture adequate volume   Culture   Final    NO GROWTH 4 DAYS Performed at [facility], [address], [place], [place] [address]    Report Status PENDING  Incomplete  Blood culture (routine x 2)     Status: None (Preliminary result)   Collection Time: 06/19/23  1:53 PM   Specimen: BLOOD LEFT ARM  Result Value Ref Range Status   Specimen Description BLOOD  LEFT ARM  Final   Special Requests   Final    BOTTLES DRAWN AEROBIC AND ANAEROBIC Blood Culture results may not be optimal due to an inadequate volume of blood received in culture bottles   Culture   Final    NO GROWTH 4 DAYS Performed at [facility], [address], [place], [place] [address]    Report Status PENDING  Incomplete  Aerobic Culture w Gram Stain (superficial specimen)     Status: None (Preliminary result)   Collection Time: 06/21/23 10:31 AM   Specimen: Wound  Result Value Ref Range Status   Specimen Description WOUND  Final   Special Requests LEFT LEG  Final   Gram Stain   Final    NO WBC SEEN RARE GRAM POSITIVE RODS RARE GRAM POSITIVE COCCI IN PAIRS    Culture   Final    CULTURE REINCUBATED FOR BETTER GROWTH Performed at [facility], [address], [place], [place] [address]    Report Status PENDING  Incomplete         Radiology Studies: No results found.      Scheduled Meds:  aspirin EC  81 mg Oral Daily   empagliflozin  10 mg Oral Daily   enoxaparin (LOVENOX) injection  40 mg Subcutaneous Q24H   feeding supplement  237 mL Oral BID BM   fludrocortisone  0.2 mg Oral Daily   Gerhardt's butt cream   Topical BID   insulin aspart  0-9 Units Subcutaneous TID WC   insulin detemir  20 Units Subcutaneous QHS   metroNIDAZOLE  500 mg Oral Q12H   polyethylene glycol  17 g Oral Daily   rosuvastatin  5 mg Oral Once per day on Monday Thursday   senna  1 tablet Oral BID   sodium chloride flush  3 mL Intravenous Q12H   ticagrelor  90 mg Oral BID   Continuous Infusions:  sodium chloride     cefTRIAXone (ROCEPHIN)  IV 2 g (06/22/23 1657)   DAPTOmycin 700 mg (06/22/23 1810)     LOS:  4 days    Time spent: over 30 min    Lacretia Nicks, MD [facility]   To contact the attending provider between 7A-7P or the covering provider during after hours 7P-7A, please log into the web site [email or web address] and access using universal [facility] password for that web site. If you do not have the password, please call the hospital operator.  06/23/2023, 9:56 AM

## 2023-06-23 NOTE — Plan of Care (Signed)
  Problem: Activity: Goal: Ability to return to baseline activity level will improve Outcome: Progressing   Problem: Cardiac: Goal: Ability to maintain an adequate cardiac output will improve Outcome: Progressing   Problem: Fluid Volume: Goal: Ability to achieve a balanced intake and output will improve Outcome: Progressing   Problem: Metabolic: Goal: Ability to maintain appropriate glucose levels will improve Outcome: Progressing

## 2023-06-24 ENCOUNTER — Encounter (HOSPITAL_COMMUNITY): Payer: Medicare HMO

## 2023-06-24 DIAGNOSIS — Z8619 Personal history of other infectious and parasitic diseases: Secondary | ICD-10-CM

## 2023-06-24 DIAGNOSIS — A4902 Methicillin resistant Staphylococcus aureus infection, unspecified site: Secondary | ICD-10-CM | POA: Diagnosis not present

## 2023-06-24 DIAGNOSIS — M86162 Other acute osteomyelitis, left tibia and fibula: Secondary | ICD-10-CM | POA: Diagnosis not present

## 2023-06-24 DIAGNOSIS — M869 Osteomyelitis, unspecified: Secondary | ICD-10-CM | POA: Diagnosis not present

## 2023-06-24 LAB — GLUCOSE, CAPILLARY
Glucose-Capillary: 163 mg/dL — ABNORMAL HIGH (ref 70–99)
Glucose-Capillary: 207 mg/dL — ABNORMAL HIGH (ref 70–99)
Glucose-Capillary: 212 mg/dL — ABNORMAL HIGH (ref 70–99)
Glucose-Capillary: 267 mg/dL — ABNORMAL HIGH (ref 70–99)

## 2023-06-24 LAB — AEROBIC CULTURE W GRAM STAIN (SUPERFICIAL SPECIMEN): Gram Stain: NONE SEEN

## 2023-06-24 LAB — CULTURE, BLOOD (ROUTINE X 2)
Culture: NO GROWTH
Culture: NO GROWTH
Special Requests: ADEQUATE

## 2023-06-24 MED ORDER — SODIUM CHLORIDE 0.9 % IV SOLN
1.0000 g | Freq: Three times a day (TID) | INTRAVENOUS | Status: AC
  Administered 2023-06-24 – 2023-06-29 (×17): 1 g via INTRAVENOUS
  Filled 2023-06-24 (×17): qty 20

## 2023-06-24 NOTE — Progress Notes (Addendum)
Transition of Care [facility]) - Inpatient Brief Assessment   Patient Details  Name: Micheal Moore. MRN: [id number] Date of Birth: [birth year]  [facility] of [facility]) CM/SW Contact:    Janae Bridgeman, RN Phone Number: 06/24/2023, 3:32 PM   Clinical Narrative: Patient admitted for Osteomyelitis.  Seen by ID MD today - consult by Dr. Lajoyce Corners recommended at this time.  Patient with recent history of home health services with [facility] but not at this time.  CM will continue to follow the patient for likely home health needs.  CM with TOC will continue to follow the patient for TOC needs.   Transition of Care Asessment: Insurance and Status: (P) Insurance coverage has been reviewed Patient has primary care physician: (P) Yes Home environment has been reviewed: (P) From home with spouse   Prior/Current Home Services: (P) Current home services (Active with [facility]) Social Determinants of Health Reivew: (P) SDOH reviewed needs interventions Readmission risk has been reviewed: (P) Yes Transition of care needs: (P) transition of care needs identified, TOC will continue to follow

## 2023-06-24 NOTE — Progress Notes (Signed)
[facility]  Date of Admission:  06/19/2023      Total days of antibiotics 5   Daptomycin  Meropenem 10/7 > c           ASSESSMENT: Micheal Cham. is a 72 y.o. male admitted with:   L distal fibula Osteomyelitis -  L Lower leg wound, Chronic -  -wound cultures reviewed of chronic wound - MRSA and a difficult to treat Pseudomonas (R-levaquin, Intermediate - cipro and cefepime)  -Recommend orthopedic consult to determine any possible surgical intervention to help with this infection. Dr. Lajoyce Corners will see. He has already had 2 hospitalizations this year for sequelae from leg wound and will be at high risk for that pattern to continue.  -ABIs scheduled today  -Contact precautions please - notified his RN  *If no surgical indications and does need consideration of vascular optimization should be able to provide final recs regarding duration and place PICC line (no oral options for pseudomonas).   H/O MRSA Bacteremia - -treated 4 weeks IV daptomycin through end of August 2024.  -MRSA growing on wound culture presently.   Disposition -  -pending  PLAN: FU ABIs FU Dr. Audrie Lia recommendations  Continue Daptomycin Stop cefepime - start meropenem  Contact precautions  Depending on #2 will plan PICC for tomorrow 10/8 - need to sort out where he will receive these as I am not sure he is able to complete a 4 dose regimen of IV ABX at home easily.    Principal Problem:   Osteomyelitis (HCC) Active Problems:   Type 2 diabetes mellitus with hyperlipidemia (HCC)   Hyperlipidemia   Protein-calorie malnutrition, moderate (HCC)   CAD (coronary artery disease)   PVD (peripheral vascular disease) (HCC)   Ambulatory dysfunction    aspirin EC  81 mg Oral Daily   empagliflozin  10 mg Oral Daily   enoxaparin (LOVENOX) injection  40 mg Subcutaneous Q24H   feeding supplement  237 mL Oral BID BM   fludrocortisone  0.2 mg Oral Daily   Gerhardt's  butt cream   Topical BID   insulin aspart  0-9 Units Subcutaneous TID WC   insulin aspart  3 Units Subcutaneous TID WC   insulin detemir  20 Units Subcutaneous QHS   metroNIDAZOLE  500 mg Oral Q12H   polyethylene glycol  17 g Oral Daily   rosuvastatin  5 mg Oral Once per day on Monday Thursday   senna  1 tablet Oral BID   sodium chloride flush  3 mL Intravenous Q12H   ticagrelor  90 mg Oral BID    SUBJECTIVE: Doing OK today overall    Review of Systems: Review of Systems  Constitutional:  Negative for chills and fever.  HENT:  Negative for tinnitus.   Eyes:  Negative for blurred vision and photophobia.  Respiratory:  Negative for cough and sputum production.   Cardiovascular:  Negative for chest pain.  Gastrointestinal:  Negative for diarrhea, nausea and vomiting.  Genitourinary:  Negative for dysuria.  Musculoskeletal:  Positive for joint pain.  Skin:  Negative for rash.  Neurological:  Negative for headaches.    No Known Allergies  OBJECTIVE: Vitals:   06/23/23 1608 06/23/23 1955 06/24/23 0340 06/24/23 0750  BP: 129/71 122/77 113/74 (!) 150/84  Pulse: 95 91 93 99  Resp: 18 18 18    Temp: 98.2 F (36.8 C) 98.2 F (36.8 C) 98.3 F (36.8 C) (!) 97.4 F (  36.3 C)  TempSrc: Oral Oral  Oral  SpO2: 97% 98% 98% 100%  Weight:      Height:       Body mass index is 23.12 kg/m.  Physical Exam Constitutional:      Appearance: Normal appearance. He is not ill-appearing.  HENT:     Head: Normocephalic.     Mouth/Throat:     Mouth: Mucous membranes are moist.     Pharynx: Oropharynx is clear.  Eyes:     General: No scleral icterus. Cardiovascular:     Rate and Rhythm: Normal rate and regular rhythm.  Pulmonary:     Effort: Pulmonary effort is normal.  Musculoskeletal:        General: Normal range of motion.     Cervical back: Normal range of motion.     Comments: LLE wrapped   Skin:    Coloration: Skin is not jaundiced or pale.  Neurological:     Mental Status:  He is alert and oriented to person, place, and time.  Psychiatric:        Mood and Affect: Mood normal.        Judgment: Judgment normal.     Lab Results Lab Results  Component Value Date   WBC 4.5 06/22/2023   HGB 11.5 (L) 06/22/2023   HCT 36.3 (L) 06/22/2023   MCV 92.1 06/22/2023   PLT 327 06/22/2023    Lab Results  Component Value Date   CREATININE 0.87 06/22/2023   BUN 22 06/22/2023   NA 136 06/22/2023   K 4.1 06/22/2023   CL 101 06/22/2023   CO2 26 06/22/2023    Lab Results  Component Value Date   ALT 11 06/22/2023   AST 12 (L) 06/22/2023   ALKPHOS 56 06/22/2023   BILITOT 0.1 (L) 06/22/2023     Microbiology: Recent Results (from the past 240 hour(s))  Blood culture (routine x 2)     Status: None   Collection Time: 06/19/23  1:12 PM   Specimen: BLOOD  Result Value Ref Range Status   Specimen Description BLOOD RIGHT ANTECUBITAL  Final   Special Requests   Final    BOTTLES DRAWN AEROBIC ONLY Blood Culture adequate volume   Culture   Final    NO GROWTH 5 DAYS Performed at [facility], [address], [place], [place] [address]    Report Status 06/24/2023 FINAL  Final  Blood culture (routine x 2)     Status: None   Collection Time: 06/19/23  1:53 PM   Specimen: BLOOD LEFT ARM  Result Value Ref Range Status   Specimen Description BLOOD LEFT ARM  Final   Special Requests   Final    BOTTLES DRAWN AEROBIC AND ANAEROBIC Blood Culture results may not be optimal due to an inadequate volume of blood received in culture bottles   Culture   Final    NO GROWTH 5 DAYS Performed at [facility], [address], [place], [place] [address]    Report Status 06/24/2023 FINAL  Final  Aerobic Culture w Gram Stain (superficial specimen)     Status: None   Collection Time: 06/21/23 10:31 AM   Specimen: Wound  Result Value Ref Range Status   Specimen Description WOUND  Final   Special Requests LEFT LEG  Final   Gram Stain   Final    NO WBC SEEN RARE GRAM  POSITIVE RODS RARE GRAM POSITIVE COCCI IN PAIRS Performed at [facility], [address] [place], [place] [address]    Culture   Final    FEW METHICILLIN RESISTANT STAPHYLOCOCCUS AUREUS RARE PSEUDOMONAS AERUGINOSA    Report Status 06/24/2023 FINAL  Final   Organism ID, Bacteria METHICILLIN RESISTANT STAPHYLOCOCCUS AUREUS  Final   Organism ID, Bacteria PSEUDOMONAS AERUGINOSA  Final      Susceptibility   Methicillin resistant staphylococcus aureus - MIC*    CIPROFLOXACIN >=8 RESISTANT Resistant     ERYTHROMYCIN >=8 RESISTANT Resistant     GENTAMICIN <=0.5 SENSITIVE Sensitive     OXACILLIN >=4 RESISTANT Resistant     TETRACYCLINE <=1 SENSITIVE Sensitive     VANCOMYCIN <=0.5 SENSITIVE Sensitive     TRIMETH/SULFA <=10 SENSITIVE Sensitive     CLINDAMYCIN RESISTANT Resistant     RIFAMPIN <=0.5 SENSITIVE Sensitive     Inducible Clindamycin POSITIVE Resistant     LINEZOLID 2 SENSITIVE Sensitive     * FEW METHICILLIN RESISTANT STAPHYLOCOCCUS AUREUS   Pseudomonas aeruginosa - MIC*    CEFTAZIDIME 16 INTERMEDIATE Intermediate     CIPROFLOXACIN 1 INTERMEDIATE Intermediate     GENTAMICIN <=1 SENSITIVE Sensitive     IMIPENEM 1 SENSITIVE Sensitive     * RARE PSEUDOMONAS AERUGINOSA    Rexene Alberts, MSN, NP-C [facility] Pager: [phone]  @TODAY @ 9:20 AM

## 2023-06-24 NOTE — Progress Notes (Signed)
PROGRESS NOTE    Micheal Moore.  UVO:[id number] DOB: Mar 03, 1951 DOA: 06/19/2023 PCP: [name], MD  Chief Complaint  Patient presents with   Wound Infection    Brief Narrative:   Micheal Moore. is Micheal Moore 72 y.o. male with medical history significant of CAD s/p PCI April 2024, PAD, DM-2, chronic hypotension, recent hospitalization from 8/7-8/17 for polymicrobial bacteremia-discharged home on IV antibiotics presented to the hospital on 10/2 with worsening pain/discharge from his wound in his left ankle area.  Per patient-he has had Yamilka Lopiccolo ulcer in this area of his posterior aspect of his left ankle for the past several months-he claims he developed it during his stay in rehab for hip fracture (bed was small-his legs were dangling out-and developed ulcers in bilateral posterior ankle areas-right also heel-left also worsened) .  Over the past several weeks-this has gradually worsened-but over the past few days-pain and discharge from the ulcer has markedly worsened.  He states the pain is intractable-10 out of 10-and reports foul-smelling discharge that is yellow color.  There is no history of fever. No history of nausea, vomiting, diarrhea, body aches or chills.  Per patient-he is usually nonambulatory at baseline since at least February 2024.    Assessment & Plan:   Principal Problem:   Osteomyelitis (HCC) Active Problems:   CAD (coronary artery disease)   Type 2 diabetes mellitus with hyperlipidemia (HCC)   PVD (peripheral vascular disease) (HCC)   Hyperlipidemia   Protein-calorie malnutrition, moderate (HCC)   Ambulatory dysfunction   MRSA infection   History of MDR Pseudomonas aeruginosa infection  Left diabetic foot infection Osteomyelitis  MRI with osteomyelitis of the fibular shaft and distal metaphysis with surrounding myositis - endosteal edema in distal tibia - abnormal edema and mild enhancement posteriorly in the calcaneus suspicious for early osteo (see  report) Appreciate ortho recs -> no immediate surgical indication, recommending ID consult, wet to dry dressing changes ABI pending ID consult -> deep tissue swab (MRSA, pseudomonas), ABI pending -- dapto and merrem per ID Continue broad spectrum antibiotics  Awaiting ortho    History of PAD Recent lower extremity angiogram May 2024-no significant femoropopliteal occlusive disease noted On dual antiplatelet/statin   CAD-s/p PCI to RCA April 2024 No anginal symptoms On aspirin/Brilinta/statin (suspect not on beta-blocker due to history of chronic hypotension) Appreciate cardiology assistance -> should remain on DAPT for 1 year unless plan for surgery (brilinta can be held prior to surgery if needed - 5 days prior and restarted ASAP post operatively.  Aspirin should be continued indefinitely and throughout perioperative period.  See 10/3 cardiology note.   DM-2 Levemir 20 units/SSI/Jardiance Follow CBGs and optimize - BG's elevated, switch to carb mod diet and adjust insulin regimen as indicated   History of orthostatic hypotension Florinef   Chronic ambulatory dysfunction/nonambulatory/bedbound status Reviewed prior discharge summary from Dr. [place] Service complicated history of bilateral hip repairs-severe fear of falling and injuring himself-and apparently his chosen to be bedridden.  Per patient report-he has been bedbound since February 2024.    DVT prophylaxis: lovenox Code Status: full Family Communication: none Disposition:   Status is: Inpatient Remains inpatient appropriate because: need for ortho, ID consults   Consultants:  Ortho ID  Procedures:  none  Antimicrobials:  Anti-infectives (From admission, onward)    Start     Dose/Rate Route Frequency Ordered Stop   06/24/23 1100  meropenem (MERREM) 1 g in sodium chloride 0.9 % 100 mL IVPB  1 g 200 mL/hr over 30 Minutes Intravenous Every 8 hours 06/24/23 0928     06/23/23 1630  ceFEPIme (MAXIPIME) 2  g in sodium chloride 0.9 % 100 mL IVPB  Status:  Discontinued        2 g 200 mL/hr over 30 Minutes Intravenous Every 8 hours 06/23/23 1535 06/24/23 0928   06/21/23 2200  metroNIDAZOLE (FLAGYL) tablet 500 mg  Status:  Discontinued        500 mg Oral Every 12 hours 06/21/23 0901 06/24/23 0927   06/21/23 1415  DAPTOmycin (CUBICIN) IVPB 700 mg/149mL premix        8 mg/kg  88.5 kg 200 mL/hr over 30 Minutes Intravenous Daily 06/21/23 1318     06/20/23 0300  vancomycin (VANCOREADY) IVPB 1500 mg/300 mL  Status:  Discontinued        1,500 mg 150 mL/hr over 120 Minutes Intravenous Every 12 hours 06/19/23 2040 06/21/23 1318   06/19/23 1615  cefTRIAXone (ROCEPHIN) 2 g in sodium chloride 0.9 % 100 mL IVPB  Status:  Discontinued        2 g 200 mL/hr over 30 Minutes Intravenous Every 24 hours 06/19/23 1608 06/23/23 1428   06/19/23 1615  metroNIDAZOLE (FLAGYL) IVPB 500 mg  Status:  Discontinued        500 mg 100 mL/hr over 60 Minutes Intravenous Every 12 hours 06/19/23 1608 06/21/23 0901   06/19/23 1430  vancomycin (VANCOCIN) IVPB 1000 mg/200 mL premix  Status:  Discontinued        1,000 mg 200 mL/hr over 60 Minutes Intravenous NOW 06/19/23 1417 06/19/23 1426   06/19/23 1430  piperacillin-tazobactam (ZOSYN) IVPB 3.375 g        3.375 g 12.5 mL/hr over 240 Minutes Intravenous NOW 06/19/23 1417 06/19/23 1520   06/19/23 1430  vancomycin (VANCOCIN) IVPB 1000 mg/200 mL premix  Status:  Discontinued       Placed in "And" Linked Group   1,000 mg 200 mL/hr over 60 Minutes Intravenous  Once 06/19/23 1426 06/19/23 2040   06/19/23 1430  vancomycin (VANCOCIN) IVPB 1000 mg/200 mL premix       Placed in "And" Linked Group   1,000 mg 200 mL/hr over 60 Minutes Intravenous  Once 06/19/23 1426 06/19/23 1539       Subjective: No complaints  Objective: Vitals:   06/23/23 1955 06/24/23 0340 06/24/23 0750 06/24/23 1627  BP: 122/77 113/74 (!) 150/84 (!) 141/83  Pulse: 91 93 99 96  Resp: 18 18    Temp: 98.2 F  (36.8 C) 98.3 F (36.8 C) (!) 97.4 F (36.3 C) 98.6 F (37 C)  TempSrc: Oral  Oral Oral  SpO2: 98% 98% 100% 100%  Weight:      Height:        Intake/Output Summary (Last 24 hours) at 06/24/2023 1746 Last data filed at 06/24/2023 1324 Gross per 24 hour  Intake 440 ml  Output 2250 ml  Net -1810 ml   Filed Weights   06/19/23 1247 06/21/23 0418  Weight: 88.5 kg 88.5 kg    Examination:  General: No acute distress. Cardiovascular: RRR Lungs: unlabored Neurological: Alert and oriented 3. Moves all extremities 4 with equal strength. Cranial nerves II through XII grossly intact. Extremities: dressing to LLE   Data Reviewed: I have personally reviewed following labs and imaging studies  CBC: Recent Labs  Lab 06/19/23 1353 06/19/23 1759 06/20/23 0624 06/21/23 0551 06/22/23 0505  WBC 6.8 6.8 6.7 4.9 4.5  NEUTROABS  --   --   --  2.5 2.3  HGB 13.1 12.7* 12.7* 12.3* 11.5*  HCT 41.1 40.0 39.6 38.7* 36.3*  MCV 91.9 92.0 90.6 91.5 92.1  PLT 362 377 346 329 327    Basic Metabolic Panel: Recent Labs  Lab 06/19/23 1803 06/20/23 0624 06/21/23 0551 06/22/23 0505  NA 134* 130* 132* 136  K 4.0 3.8 3.9 4.1  CL 98 95* 98 101  CO2 22 22 21* 26  GLUCOSE 149* 202* 157* 212*  BUN 18 20 19 22   CREATININE 0.89 0.92 0.64 0.87  CALCIUM 9.0 8.9 8.6* 8.8*  MG  --   --  2.2 2.2  PHOS  --   --  3.9 4.0    GFR: Estimated Creatinine Clearance: 96.1 mL/min (by C-G formula based on SCr of 0.87 mg/dL).  Liver Function Tests: Recent Labs  Lab 06/20/23 0624 06/21/23 0551 06/22/23 0505  AST 12* 13* 12*  ALT 12 10 11   ALKPHOS 61 55 56  BILITOT 0.4 0.5 0.1*  PROT 7.4 6.9 6.7  ALBUMIN 2.9* 2.8* 2.7*    CBG: Recent Labs  Lab 06/23/23 1606 06/23/23 1952 06/24/23 0751 06/24/23 1231 06/24/23 1628  GLUCAP 196* 189* 163* 207* 212*     Recent Results (from the past 240 hour(s))  Blood culture (routine x 2)     Status: None   Collection Time: 06/19/23  1:12 PM   Specimen:  BLOOD  Result Value Ref Range Status   Specimen Description BLOOD RIGHT ANTECUBITAL  Final   Special Requests   Final    BOTTLES DRAWN AEROBIC ONLY Blood Culture adequate volume   Culture   Final    NO GROWTH 5 DAYS Performed at [facility], [address], [place], [place] [address]    Report Status 06/24/2023 FINAL  Final  Blood culture (routine x 2)     Status: None   Collection Time: 06/19/23  1:53 PM   Specimen: BLOOD LEFT ARM  Result Value Ref Range Status   Specimen Description BLOOD LEFT ARM  Final   Special Requests   Final    BOTTLES DRAWN AEROBIC AND ANAEROBIC Blood Culture results may not be optimal due to an inadequate volume of blood received in culture bottles   Culture   Final    NO GROWTH 5 DAYS Performed at [facility], [address], [place], [place] [address]    Report Status 06/24/2023 FINAL  Final  Aerobic Culture w Gram Stain (superficial specimen)     Status: None   Collection Time: 06/21/23 10:31 AM   Specimen: Wound  Result Value Ref Range Status   Specimen Description WOUND  Final   Special Requests LEFT LEG  Final   Gram Stain   Final    NO WBC SEEN RARE GRAM POSITIVE RODS RARE GRAM POSITIVE COCCI IN PAIRS Performed at [facility], [address], [place], [place] [address]    Culture   Final    FEW METHICILLIN RESISTANT STAPHYLOCOCCUS AUREUS RARE PSEUDOMONAS AERUGINOSA    Report Status 06/24/2023 FINAL  Final   Organism ID, Bacteria METHICILLIN RESISTANT STAPHYLOCOCCUS AUREUS  Final   Organism ID, Bacteria PSEUDOMONAS AERUGINOSA  Final      Susceptibility   Methicillin resistant staphylococcus aureus - MIC*    CIPROFLOXACIN >=8 RESISTANT Resistant     ERYTHROMYCIN >=8 RESISTANT Resistant     GENTAMICIN <=0.5 SENSITIVE Sensitive     OXACILLIN >=4 RESISTANT Resistant     TETRACYCLINE <=1 SENSITIVE Sensitive  VANCOMYCIN <=0.5 SENSITIVE Sensitive     TRIMETH/SULFA <=10 SENSITIVE Sensitive     CLINDAMYCIN RESISTANT  Resistant     RIFAMPIN <=0.5 SENSITIVE Sensitive     Inducible Clindamycin POSITIVE Resistant     LINEZOLID 2 SENSITIVE Sensitive     * FEW METHICILLIN RESISTANT STAPHYLOCOCCUS AUREUS   Pseudomonas aeruginosa - MIC*    CEFTAZIDIME 16 INTERMEDIATE Intermediate     CIPROFLOXACIN 1 INTERMEDIATE Intermediate     GENTAMICIN <=1 SENSITIVE Sensitive     IMIPENEM 1 SENSITIVE Sensitive     * RARE PSEUDOMONAS AERUGINOSA         Radiology Studies: No results found.      Scheduled Meds:  aspirin EC  81 mg Oral Daily   empagliflozin  10 mg Oral Daily   enoxaparin (LOVENOX) injection  40 mg Subcutaneous Q24H   feeding supplement  237 mL Oral BID BM   fludrocortisone  0.2 mg Oral Daily   Gerhardt's butt cream   Topical BID   insulin aspart  0-9 Units Subcutaneous TID WC   insulin aspart  3 Units Subcutaneous TID WC   insulin detemir  20 Units Subcutaneous QHS   polyethylene glycol  17 g Oral Daily   rosuvastatin  5 mg Oral Once per day on Monday Thursday   senna  1 tablet Oral BID   sodium chloride flush  3 mL Intravenous Q12H   ticagrelor  90 mg Oral BID   Continuous Infusions:  sodium chloride     DAPTOmycin 700 mg (06/24/23 1322)   meropenem (MERREM) IV 1 g (06/24/23 1541)     LOS: 5 days    Time spent: over 30 min    Lacretia Nicks, MD [facility]   To contact the attending provider between 7A-7P or the covering provider during after hours 7P-7A, please log into the web site [email or web address] and access using universal [place] password for that web site. If you do not have the password, please call the hospital operator.  06/24/2023, 5:46 PM

## 2023-06-24 NOTE — Plan of Care (Signed)
  Problem: Activity: Goal: Ability to return to baseline activity level will improve Outcome: Progressing   Problem: Respiratory: Goal: Will regain and/or maintain adequate ventilation Outcome: Progressing   Problem: Skin Integrity: Goal: Will show signs of wound healing Outcome: Progressing

## 2023-06-25 DIAGNOSIS — M869 Osteomyelitis, unspecified: Secondary | ICD-10-CM | POA: Diagnosis not present

## 2023-06-25 DIAGNOSIS — A4902 Methicillin resistant Staphylococcus aureus infection, unspecified site: Secondary | ICD-10-CM | POA: Diagnosis not present

## 2023-06-25 DIAGNOSIS — M86162 Other acute osteomyelitis, left tibia and fibula: Secondary | ICD-10-CM | POA: Diagnosis not present

## 2023-06-25 LAB — GLUCOSE, CAPILLARY
Glucose-Capillary: 183 mg/dL — ABNORMAL HIGH (ref 70–99)
Glucose-Capillary: 205 mg/dL — ABNORMAL HIGH (ref 70–99)
Glucose-Capillary: 240 mg/dL — ABNORMAL HIGH (ref 70–99)
Glucose-Capillary: 226 mg/dL — ABNORMAL HIGH (ref 70–99)

## 2023-06-25 MED ORDER — TAMSULOSIN HCL 0.4 MG PO CAPS
0.4000 mg | ORAL_CAPSULE | Freq: Every day | ORAL | Status: DC
  Administered 2023-06-25 – 2023-07-04 (×10): 0.4 mg via ORAL
  Filled 2023-06-25 (×11): qty 1

## 2023-06-25 MED ORDER — INSULIN ASPART 100 UNIT/ML IJ SOLN
5.0000 [IU] | Freq: Three times a day (TID) | INTRAMUSCULAR | Status: DC
  Administered 2023-06-25 – 2023-07-03 (×24): 5 [IU] via SUBCUTANEOUS

## 2023-06-25 MED ORDER — INSULIN DETEMIR 100 UNIT/ML ~~LOC~~ SOLN
23.0000 [IU] | Freq: Every day | SUBCUTANEOUS | Status: DC
  Administered 2023-06-25 – 2023-07-02 (×8): 23 [IU] via SUBCUTANEOUS
  Filled 2023-06-25 (×9): qty 0.23

## 2023-06-25 MED ORDER — INSULIN ASPART 100 UNIT/ML IJ SOLN: 5.0000 [IU] | Freq: Three times a day (TID) | INTRAMUSCULAR | Status: DC

## 2023-06-25 NOTE — Progress Notes (Signed)
[facility]  Date of Admission:  06/19/2023      Total days of antibiotics 6   Daptomycin  Meropenem 10/7 > c           ASSESSMENT: Micheal Moore. is a 72 y.o. male admitted with:   L distal fibula Osteomyelitis -  L Lower leg wound, Chronic -  -wound cultures reviewed of chronic wound - MRSA and a difficult to treat Pseudomonas (R-levaquin, Intermediate - cipro and cefepime)  -Recommending AKA for curative surgical management which he is in agreement with  -continue daptomycin + meropenem pre op and can stop 24h after surgery   H/O MRSA Bacteremia - -treated 4 weeks IV daptomycin through end of August 2024.  -MRSA growing on wound culture presently.   Disposition -  -pending   PLAN: Patient agreeable for definitive AKA  Continue current antibiotics for 24h after surgery - no further antibiotics will be needed.   ID will sign off - please call back with any questions/concerns or if we can be of further assistance, or if patient's surgical plans change.     Principal Problem:   Osteomyelitis (HCC) Active Problems:   Type 2 diabetes mellitus with hyperlipidemia (HCC)   Hyperlipidemia   Protein-calorie malnutrition, moderate (HCC)   CAD (coronary artery disease)   PVD (peripheral vascular disease) (HCC)   Ambulatory dysfunction   MRSA infection   History of MDR Pseudomonas aeruginosa infection    aspirin EC  81 mg Oral Daily   empagliflozin  10 mg Oral Daily   enoxaparin (LOVENOX) injection  40 mg Subcutaneous Q24H   feeding supplement  237 mL Oral BID BM   fludrocortisone  0.2 mg Oral Daily   Gerhardt's butt cream   Topical BID   insulin aspart  0-9 Units Subcutaneous TID WC   insulin aspart  5 Units Subcutaneous TID WC   insulin detemir  23 Units Subcutaneous QHS   polyethylene glycol  17 g Oral Daily   rosuvastatin  5 mg Oral Once per day on Monday Thursday   senna  1 tablet Oral BID   sodium chloride flush   3 mL Intravenous Q12H   ticagrelor  90 mg Oral BID    SUBJECTIVE: Doing OK today overall  Desires to proceed with AKA and "thought that may be the recommendation"   Review of Systems: Review of Systems  Constitutional:  Negative for chills and fever.  HENT:  Negative for tinnitus.   Eyes:  Negative for blurred vision and photophobia.  Respiratory:  Negative for cough and sputum production.   Cardiovascular:  Negative for chest pain.  Gastrointestinal:  Negative for diarrhea, nausea and vomiting.  Genitourinary:  Negative for dysuria.  Musculoskeletal:  Positive for joint pain.  Skin:  Negative for rash.  Neurological:  Negative for headaches.    No Known Allergies  OBJECTIVE: Vitals:   06/24/23 1627 06/24/23 2010 06/25/23 0404 06/25/23 0733  BP: (!) 141/83 112/72 121/70 (!) 158/80  Pulse: 96 100 96 98  Resp:   19   Temp: 98.6 F (37 C) 98.4 F (36.9 C) 98.4 F (36.9 C) 98 F (36.7 C)  TempSrc: Oral Oral Oral Oral  SpO2: 100% 100% 99% 100%  Weight:      Height:       Body mass index is 23.12 kg/m.  Physical Exam Constitutional:      Appearance: Normal appearance. He is not ill-appearing.  HENT:     Head: Normocephalic.     Mouth/Throat:     Mouth: Mucous membranes are moist.     Pharynx: Oropharynx is clear.  Eyes:     General: No scleral icterus. Cardiovascular:     Rate and Rhythm: Normal rate and regular rhythm.  Pulmonary:     Effort: Pulmonary effort is normal.  Musculoskeletal:        General: Normal range of motion.     Cervical back: Normal range of motion.     Comments: LLE wrapped   Skin:    Coloration: Skin is not jaundiced or pale.  Neurological:     Mental Status: He is alert and oriented to person, place, and time.  Psychiatric:        Mood and Affect: Mood normal.        Judgment: Judgment normal.     Lab Results Lab Results  Component Value Date   WBC 4.5 06/22/2023   HGB 11.5 (L) 06/22/2023   HCT 36.3 (L) 06/22/2023   MCV  92.1 06/22/2023   PLT 327 06/22/2023    Lab Results  Component Value Date   CREATININE 0.87 06/22/2023   BUN 22 06/22/2023   NA 136 06/22/2023   K 4.1 06/22/2023   CL 101 06/22/2023   CO2 26 06/22/2023    Lab Results  Component Value Date   ALT 11 06/22/2023   AST 12 (L) 06/22/2023   ALKPHOS 56 06/22/2023   BILITOT 0.1 (L) 06/22/2023     Microbiology: Recent Results (from the past 240 hour(s))  Blood culture (routine x 2)     Status: None   Collection Time: 06/19/23  1:12 PM   Specimen: BLOOD  Result Value Ref Range Status   Specimen Description BLOOD RIGHT ANTECUBITAL  Final   Special Requests   Final    BOTTLES DRAWN AEROBIC ONLY Blood Culture adequate volume   Culture   Final    NO GROWTH 5 DAYS Performed at [facility], [address] [place], [place] [address]    Report Status 06/24/2023 FINAL  Final  Blood culture (routine x 2)     Status: None   Collection Time: 06/19/23  1:53 PM   Specimen: BLOOD LEFT ARM  Result Value Ref Range Status   Specimen Description BLOOD LEFT ARM  Final   Special Requests   Final    BOTTLES DRAWN AEROBIC AND ANAEROBIC Blood Culture results may not be optimal due to an inadequate volume of blood received in culture bottles   Culture   Final    NO GROWTH 5 DAYS Performed at [facility], [address], [place], [place] [address]    Report Status 06/24/2023 FINAL  Final  Aerobic Culture w Gram Stain (superficial specimen)     Status: None   Collection Time: 06/21/23 10:31 AM   Specimen: Wound  Result Value Ref Range Status   Specimen Description WOUND  Final   Special Requests LEFT LEG  Final   Gram Stain   Final    NO WBC SEEN RARE GRAM POSITIVE RODS RARE GRAM POSITIVE COCCI IN PAIRS Performed at [facility], [address] [place], [place] [address]    Culture   Final    FEW METHICILLIN RESISTANT STAPHYLOCOCCUS AUREUS RARE PSEUDOMONAS AERUGINOSA    Report Status 06/24/2023 FINAL  Final   Organism  ID, Bacteria METHICILLIN RESISTANT STAPHYLOCOCCUS AUREUS  Final   Organism ID, Bacteria PSEUDOMONAS AERUGINOSA  Final      Susceptibility   Methicillin resistant staphylococcus aureus - MIC*    CIPROFLOXACIN >=8 RESISTANT Resistant     ERYTHROMYCIN >=8 RESISTANT Resistant     GENTAMICIN <=0.5 SENSITIVE Sensitive     OXACILLIN >=4 RESISTANT Resistant     TETRACYCLINE <=1 SENSITIVE Sensitive     VANCOMYCIN <=0.5 SENSITIVE Sensitive     TRIMETH/SULFA <=10 SENSITIVE Sensitive     CLINDAMYCIN RESISTANT Resistant     RIFAMPIN <=0.5 SENSITIVE Sensitive     Inducible Clindamycin POSITIVE Resistant     LINEZOLID 2 SENSITIVE Sensitive     * FEW METHICILLIN RESISTANT STAPHYLOCOCCUS AUREUS   Pseudomonas aeruginosa - MIC*    CEFTAZIDIME 16 INTERMEDIATE Intermediate     CIPROFLOXACIN 1 INTERMEDIATE Intermediate     GENTAMICIN <=1 SENSITIVE Sensitive     IMIPENEM 1 SENSITIVE Sensitive     * RARE PSEUDOMONAS AERUGINOSA    Rexene Alberts, MSN, NP-C [facility] Pager: [phone]  @TODAY @ 1:33 PM

## 2023-06-25 NOTE — Consult Note (Signed)
ORTHOPAEDIC CONSULTATION  REQUESTING PHYSICIAN: Zigmund Daniel., *  Chief Complaint: Ulceration lateral left leg.  HPI: Micheal Moore. is a 72 y.o. male who presents with patient is a 72 year old gentleman who is seen for osteomyelitis fibula left leg.  Patient states that he has not walked since February status post left hip total hip arthroplasty for hip fracture.  Patient states that his bed was short and was resting his left leg on the bed railing causing the ulceration.  Past Medical History:  Diagnosis Date   Cancer (HCC)    right elbow melanoma   Diabetes mellitus without complication (HCC)    Type II   Dizziness    DVT (deep venous thrombosis) (HCC)    right leg   Frequent falls    Near syncope    Syncope    Past Surgical History:  Procedure Laterality Date   AMPUTATION TOE Left 04/28/2020   Procedure: AMPUTATION LEFT GREAT TOE;  Surgeon: Nadara Mustard, MD;  Location: [facility] OR;  Service: Orthopedics;  Laterality: Left;   CORONARY/GRAFT ACUTE MI REVASCULARIZATION N/A 01/15/2023   Procedure: Coronary/Graft Acute MI Revascularization;  Surgeon: Orbie Pyo, MD;  Location: [facility] INVASIVE CV LAB;  Service: Cardiovascular;  Laterality: N/A;   INTRAMEDULLARY (IM) NAIL INTERTROCHANTERIC Right 02/23/2022   Procedure: INTRAMEDULLARY (IM) NAIL INTERTROCHANTRIC, RIGHT;  Surgeon: Samson Frederic, MD;  Location: [facility] ORS;  Service: Orthopedics;  Laterality: Right;   LEFT HEART CATH AND CORONARY ANGIOGRAPHY N/A 01/15/2023   Procedure: LEFT HEART CATH AND CORONARY ANGIOGRAPHY;  Surgeon: Orbie Pyo, MD;  Location: [facility] INVASIVE CV LAB;  Service: Cardiovascular;  Laterality: N/A;   LOWER EXTREMITY ANGIOGRAPHY Bilateral 01/18/2023   Procedure: Lower Extremity Angiography;  Surgeon: Nada Libman, MD;  Location: [facility] INVAS[facility] CV LAB;  Service: Cardiovascular;  Laterality: Bilateral;   MELANOMA EXCISION WITH SENTINEL LYMPH NODE BIOPSY Right 08/19/2020   Procedure: WIDE LOCAL  EXCISION RIGHT ELBOW MELANOMA WITH RIGHT AXILLARY SENTINEL LYMPH NODE BIOPSY;  Surgeon: Fritzi Mandes, MD;  Location: [facility] OR;  Service: General;  Laterality: Right;  2nd incision in axilla   TEE WITHOUT CARDIOVERSION N/A 04/19/2023   Procedure: TRANSESOPHAGEAL ECHOCARDIOGRAM;  Surgeon: Little Ishikawa, MD;  [facility] INV[facility]IVE CV LAB;  Service: Cardiovascular;  Laterality: N/A;   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   Procedure: TOTAL HIP ARTHROPLASTY ANTERIOR APPROACH;  Surgeon: Samson Frederic, MD;  Location: [facility]S;  Service: Orthopedics;  Laterality: Left;   TOTAL HIP ARTHROPLASTY Left 08/24/2022   Procedure: OPEN REDUCTION, HEAD AND LINER EXCHANGE;  Surgeon: Samson Frederic, MD;  Location: [facility]S;  Service: Orthopedics;  Laterality: Left;   Social History   Socioeconomic History   Marital status: Married    Spouse name: [name]   Number of children: 1   Years of education: Not on file   Highest education level: Associate degree: occupational, Scientist, product/process development, or vocational program  Occupational History    Comment: retired  Tobacco Use   Smoking status: Every Day    Current packs/day: 0.20    Average packs/day: 0.2 packs/day for 19.0 years (3.8 ttl pk-yrs)    Types: Cigarettes   Smokeless tobacco: Never  Vaping Use   Vaping status: Never Used  Substance and Sexual Activity   Alcohol use: No   Drug use: Not Currently   Sexual activity: Not on file  Other Topics Concern   Not on file  Social History Narrative   Lives with wife   Social Determinants  of Health   Financial Resource Strain: Not on file  Food Insecurity: No Food Insecurity (04/24/2023)   Hunger Vital Sign    Worried About Running Out of Food in the Last Year: Never true    Ran Out of Food in the Last Year: Never true  Transportation Needs: No Transportation Needs (04/24/2023)   PRAPARE - Administrator, Civil Service (Medical): No    Lack of Transportation (Non-Medical): No  Physical Activity: Not  on file  Stress: Not on file  Social Connections: Not on file   Family History  Problem Relation Age of Onset   Stroke Mother    Diabetes Father    Stroke Sister    Diabetes Sister    - negative except otherwise stated in the family history section No Known Allergies Prior to Admission medications   Medication Sig Start Date End Date Taking? Authorizing Provider  acetaminophen (TYLENOL) 325 MG tablet Take 2 tablets (650 mg total) by mouth every 6 (six) hours as needed for mild pain, fever or headache. 04/22/23  Yes Lonia Blood, MD  aspirin EC 81 MG tablet Take 1 tablet (81 mg total) by mouth daily. Swallow whole. 01/20/23  Yes Lanae Boast, MD  Ensure Max Protein (ENSURE MAX PROTEIN) LIQD Take 330 mLs (11 oz total) by mouth 2 (two) times daily. Patient taking differently: Take 11 oz by mouth daily. 08/28/22  Yes Dahal, Melina Schools, MD  insulin aspart (NOVOLOG) 100 UNIT/ML FlexPen Inject 8 Units into the skin 3 (three) times daily with meals. Only take if eating a meal AND Blood Glucose (BG) is 80 or higher. 04/22/23 07/21/23 Yes Lonia Blood, MD  insulin detemir (LEVEMIR) 100 UNIT/ML FlexPen Inject 20 Units into the skin at bedtime. DO NOT TAKE if your blood sugars and consistently running < 120 04/23/23  Yes McClung, Elpidio Eric, MD  JARDIANCE 10 MG TABS tablet Take 10 mg by mouth daily. 04/09/23  Yes [provider]  polyethylene glycol (MIRALAX / GLYCOLAX) 17 g packet Take 17 g by mouth daily. Patient taking differently: Take 17 g by mouth daily as needed for mild constipation. 03/05/22  Yes Rolly Salter, MD  rosuvastatin (CRESTOR) 5 MG tablet Take 5 mg by mouth 2 (two) times a week. 02/07/23  Yes [provider]  barrier cream (NON-SPECIFIED) CREA Apply 1 Application topically 2 (two) times daily as needed. Patient not taking: Reported on 06/19/2023 05/04/23   Standley Brooking, MD  Blood Glucose Monitoring Suppl DEVI 1 each by Does not apply route 3 (three) times daily.  May dispense any manufacturer covered by patient's insurance. 01/20/23   Lanae Boast, MD  BRILINTA 90 MG TABS tablet Take 90 mg by mouth 2 (two) times daily. Patient not taking: Reported on 06/19/2023 04/09/23   [provider]  Glucagon, rDNA, (GLUCAGON EMERGENCY) 1 MG KIT Inject 1 mg into the skin as needed for up to 2 doses (Severe low blood sugar). Patient not taking: Reported on 06/19/2023 01/20/23   Lanae Boast, MD  Glucose Blood (BLOOD GLUCOSE TEST STRIPS) STRP 1 each by Does not apply route 3 (three) times daily. Use as directed to check blood sugar. May dispense any manufacturer covered by patient's insurance and fits patient's device. 01/20/23   Lanae Boast, MD  Insulin Pen Needle (PEN NEEDLES) 31G X 5 MM MISC 1 each by Does not apply route 3 (three) times daily. May dispense any manufacturer covered by patient's insurance. 01/20/23   Lanae Boast,  MD  Lancet Device MISC 1 each by Does not apply route 3 (three) times daily. May dispense any manufacturer covered by patient's insurance. 01/20/23   Lanae Boast, MD  Lancets MISC 1 each by Does not apply route 3 (three) times daily. Use as directed to check blood sugar. May dispense any manufacturer covered by patient's insurance and fits patient's device. 01/20/23   Lanae Boast, MD   No results found. - pertinent xrays, CT, MRI studies were reviewed and independently interpreted  Positive ROS: All other systems have been reviewed and were otherwise negative with the exception of those mentioned in the HPI and as above.  Physical Exam: General: Alert, no acute distress Psychiatric: Patient is competent for consent with normal mood and affect Lymphatic: No axillary or cervical lymphadenopathy Cardiovascular: No pedal edema Respiratory: No cyanosis, no use of accessory musculature GI: No organomegaly, abdomen is soft and non-tender    Images:  @ENCIMAGES @  Labs:  Lab Results  Component Value Date   HGBA1C 8.4 (H) 06/19/2023   HGBA1C 13.3 (H)  01/15/2023   HGBA1C 9.3 (H) 08/23/2022   ESRSEDRATE 59 (H) 06/21/2023   ESRSEDRATE 53 (H) 04/27/2020   CRP 11.6 (H) 06/19/2023   CRP 6.1 (H) 04/27/2020   REPTSTATUS 06/24/2023 FINAL 06/21/2023   GRAMSTAIN  06/21/2023    NO WBC SEEN RARE GRAM POSITIVE RODS RARE GRAM POSITIVE COCCI IN PAIRS Performed at [facility], [address] [place], [place] [address]    CULT  06/21/2023    FEW METHICILLIN RESISTANT STAPHYLOCOCCUS AUREUS RARE PSEUDOMONAS AERUGINOSA    LABORGA METHICILLIN RESISTANT STAPHYLOCOCCUS AUREUS 06/21/2023   LABORGA PSEUDOMONAS AERUGINOSA 06/21/2023    Lab Results  Component Value Date   ALBUMIN 2.7 (L) 06/22/2023   ALBUMIN 2.8 (L) 06/21/2023   ALBUMIN 2.9 (L) 06/20/2023   PREALBUMIN 19.6 02/23/2022   PREALBUMIN 11.2 (L) 04/27/2020        Latest Ref Rng & Units 06/22/2023    5:05 AM 06/21/2023    5:51 AM 06/20/2023    6:24 AM  CBC EXTENDED  WBC 4.0 - 10.5 K/uL 4.5  4.9  6.7   RBC 4.22 - 5.81 MIL/uL 3.94  4.23  4.37   Hemoglobin 13.0 - 17.0 g/dL 19.1  47.8  29.5   HCT 39.0 - 52.0 % 36.3  38.7  39.6   Platelets 150 - 400 K/uL 327  329  346   NEUT# 1.7 - 7.7 K/uL 2.3  2.5    Lymph# 0.7 - 4.0 K/uL 1.4  1.5      Neurologic: Patient does not have protective sensation bilateral lower extremities.   MUSCULOSKELETAL:   Skin: Examination patient has a necrotic ulcer over the lateral fibula left leg.  The ulcer extends down to the fibula with exposed fibula.  Patient does not have a palpable anterior tibial or dorsalis pedis pulse.  There are no decubitus heel ulcers bilaterally.  Hemoglobin 11.5 white cell count 4.5.  Albumin 2.7 with a hemoglobin A1c of 8.4.  Review of the MRI scan shows osteomyelitis of the fibula.  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   Plan: Plan: Recommended proceeding with an above-the-knee amputation.  Patient has insufficient soft tissue for a  below-knee amputation and with patient's nonambulatory status patient would have no functional improvement with the transtibial amputation.  Plan for surgery on Friday.  Patient states he will discuss this with his wife and will call if there is  any questions.  Thank you for the consult and the opportunity to see Mr. Yuri Fana, MD [facility] [phone] 7:57 AM

## 2023-06-25 NOTE — Progress Notes (Signed)
Physical Therapy Treatment Patient Details Name: Micheal Moore. MRN: [id number] DOB: 1951-02-15 Today's Date: 06/25/2023   History of Present Illness Micheal Moore. is a 72 y.o. male who  presented to the hospital on 10/2 with worsening pain/discharge from his wound in his left ankle area. Admitted for Osteomyelitis. Multiple recent admissions: 8/7-8/17 for polymicrobial bacteremia-discharged home on IV antibiotics and 7/24 - 8/5 due to bacteremia secondary to MRSA and Providencia, complicated by PNA. Plan for left AKA Fri Oct 11. PMHx: CAD s/p PCI, syncope, orthostatic hypotension, diabetes mellitus type 2, DVT of right leg, peripheral artery disease, right elbow melanoma s/p excision, L THA x2 (due to complications)    PT Comments  Pt admitted with above diagnosis. Pt was able to sit EOB x 5 min with min to mod assist for balance. Able to do a few exercises prior to getting uncomfortable and wanted to lay back down. Takes incr time to move pt up in bed and get pt comfortable.  Pt leans posteriorly in sitting needing constant support.  Note plan is for left AKA on Fri, 10/11. Anticipate pt may need post acute rehab < 3hours day.  Will continue to follow acutely.  Pt currently with functional limitations due to the deficits listed below (see PT Problem List). Pt will benefit from acute skilled PT to increase their independence and safety with mobility to allow discharge.       If plan is discharge home, recommend the following: Two people to help with walking and/or transfers;A lot of help with bathing/dressing/bathroom;Assistance with cooking/housework;Assist for transportation;Help with stairs or ramp for entrance   Can travel by private vehicle     No  Equipment Recommendations  Hoyer lift;Wheelchair (measurements PT);Wheelchair cushion (measurements PT) (amputee wheelchair)    Recommendations for Other Services       Precautions / Restrictions Precautions Precautions:  Fall Precaution Comments: multiple pressure injuries, back, heels Restrictions Weight Bearing Restrictions: No     Mobility  Bed Mobility Overal bed mobility: Needs Assistance Bed Mobility: Rolling, Sidelying to Sit, Sit to Supine Rolling: Min assist Sidelying to sit: Mod assist Supine to sit: Mod assist Sit to supine: Mod assist   General bed mobility comments: Heavy use of bed rails and bed controls, pt admits he is moving better than he did at home    Transfers Overall transfer level: Needs assistance                 General transfer comment: declined    Ambulation/Gait                   Stairs             Wheelchair Mobility     Tilt Bed    Modified Rankin (Stroke Patients Only)       Balance Overall balance assessment: Needs assistance Sitting-balance support: Single extremity supported, Feet supported Sitting balance-[name]: Poor Sitting balance - Comments: posterior bias due to poor spinal and hip mobility                                    Cognition Arousal: Alert Behavior During Therapy: WFL for tasks assessed/performed Overall Cognitive Status: No family/caregiver present to determine baseline cognitive functioning Area of Impairment: Attention, Memory, Following commands, Safety/judgement, Awareness, Problem solving  Current Attention Level: Sustained Memory: Decreased recall of precautions, Decreased short-term memory Following Commands: Follows one step commands consistently Safety/Judgement: Decreased awareness of deficits, Decreased awareness of safety Awareness: Intellectual Problem Solving: Requires verbal cues General Comments: likely baseline, very particular about his bed pads. needs encouragement to participate.        Exercises General Exercises - Lower Extremity Ankle Circles/Pumps: AROM, Both, 10 reps, Seated Long Arc Quad: AROM, Both, 10 reps, Seated Straight  Leg Raises: AROM, Both, 10 reps, Supine Hip Flexion/Marching: AAROM, Both, 10 reps, Seated    General Comments General comments (skin integrity, edema, etc.): VSS      Pertinent Vitals/Pain Pain Assessment Pain Assessment: Faces Faces Pain Scale: Hurts little more Pain Location: L ankle, back Pain Descriptors / Indicators: Aching Pain Intervention(s): Monitored during session, Limited activity within patient's tolerance, Repositioned    Home Living                          Prior Function            PT Goals (current goals can now be found in the care plan section) Acute Rehab PT Goals Patient Stated Goal: to go home with caregivers Progress towards PT goals: Progressing toward goals    Frequency    Min 1X/week      PT Plan      Co-evaluation              AM-PAC PT "6 Clicks" Mobility   Outcome Measure  Help needed turning from your back to your side while in a flat bed without using bedrails?: A Little Help needed moving from lying on your back to sitting on the side of a flat bed without using bedrails?: A Lot Help needed moving to and from a bed to a chair (including a wheelchair)?: Total Help needed standing up from a chair using your arms (e.g., wheelchair or bedside chair)?: Total Help needed to walk in hospital room?: Total Help needed climbing 3-5 steps with a railing? : Total 6 Click Score: 9    End of Session   Activity Tolerance: Patient limited by fatigue;Patient limited by pain Patient left: in bed;with call bell/phone within reach;with bed alarm set Nurse Communication: Mobility status;Need for lift equipment PT Visit Diagnosis: Repeated falls (R29.6);Muscle weakness (generalized) (M62.81);Other abnormalities of gait and mobility (R26.89)     Time: 1610-9604 PT Time Calculation (min) (ACUTE ONLY): 23 min  Charges:    $Therapeutic Exercise: 8-22 mins $Therapeutic Activity: 8-22 mins PT General Charges $$ ACUTE PT VISIT: 1  Visit                     Maybree Riling M,PT Acute Rehab Services [phone]    Bevelyn Buckles 06/25/2023, 2:02 PM

## 2023-06-25 NOTE — H&P (View-Only) (Signed)
ORTHOPAEDIC CONSULTATION  REQUESTING PHYSICIAN: Micheal Moore., *  Chief Complaint: Ulceration lateral left leg.  HPI: Micheal Moore. is a 72 y.o. male who presents with patient is a 72 year old gentleman who is seen for osteomyelitis fibula left leg.  Patient states that he has not walked since February status post left hip total hip arthroplasty for hip fracture.  Patient states that his bed was short and was resting his left leg on the bed railing causing the ulceration.  Past Medical History:  Diagnosis Date   Cancer (HCC)    right elbow melanoma   Diabetes mellitus without complication (HCC)    Type II   Dizziness    DVT (deep venous thrombosis) (HCC)    right leg   Frequent falls    Near syncope    Syncope    Past Surgical History:  Procedure Laterality Date   AMPUTATION TOE Left 04/28/2020   Procedure: AMPUTATION LEFT GREAT TOE;  Surgeon: Nadara Mustard, MD;  Location: [facility];  Service: Orthopedics;  Laterality: Left;   CORONARY/GRAFT ACUTE MI REVASCULARIZATION N/A 01/15/2023   Procedure: Coronary/Graft Acute MI Revascularization;  Surgeon: Orbie Pyo, MD;  Location: [facility]AS[facility];  Service: Cardiovascular;  Laterality: N/A;   INTRAMEDULLARY (IM) NAIL INTERTROCHANTERIC Right 02/23/2022   Procedure: INTRAMEDULLARY (IM) NAIL INTERTROCHANTRIC, RIGHT;  Surgeon: Samson Frederic, MD;  Location: [facility];  Service: Orthopedics;  Laterality: Right;   LEFT HEART CATH AND CORONARY ANGIOGRAPHY N/A 01/15/2023   Procedure: LEFT HEART CATH AND CORONARY ANGIOGRAPHY;  Surgeon: Orbie Pyo, MD;  Location: [facility];  Service: Cardiovascular;  Laterality: N/A;   LOWER EXTREMITY ANGIOGRAPHY Bilateral 01/18/2023   Procedure: Lower Extremity Angiography;  Surgeon: Nada Libman, MD;  Location: [facility];  Service: Cardiovascular;  Laterality: Bilateral;   MELANOMA EXCISION WITH SENTINEL LYMPH NODE BIOPSY Right 08/19/2020   Procedure: WIDE LOCAL  EXCISION RIGHT ELBOW MELANOMA WITH RIGHT AXILLARY SENTINEL LYMPH NODE BIOPSY;  Surgeon: Fritzi Mandes, MD;  Location: [facility];  Service: General;  Laterality: Right;  2nd incision in axilla   TEE WITHOUT CARDIOVERSION N/A 04/19/2023   Procedure: TRANSESOPHAGEAL ECHOCARDIOGRAM;  Surgeon: Little Ishikawa, MD;  [facility] IN[facility] CV LAB;  Service: Cardiovascular;  Laterality: N/A;   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   Procedure: TOTAL HIP ARTHROPLASTY ANTERIOR APPROACH;  Surgeon: Samson Frederic, MD;  Location: [facility];  Service: Orthopedics;  Laterality: Left;   TOTAL HIP ARTHROPLASTY Left 08/24/2022   Procedure: OPEN REDUCTION, HEAD AND LINER EXCHANGE;  Surgeon: Samson Frederic, MD;  Location: [facility];  Service: Orthopedics;  Laterality: Left;   Social History   Socioeconomic History   Marital status: Married    Spouse name: Micheal Moore   Number of children: 1   Years of education: Not on file   Highest education level: Associate degree: occupational, Scientist, product/process development, or vocational program  Occupational History    Comment: retired  Tobacco Use   Smoking status: Every Day    Current packs/day: 0.20    Average packs/day: 0.2 packs/day for 19.0 years (3.8 ttl pk-yrs)    Types: Cigarettes   Smokeless tobacco: Never  Vaping Use   Vaping status: Never Used  Substance and Sexual Activity   Alcohol use: No   Drug use: Not Currently   Sexual activity: Not on file  Other Topics Concern   Not on file  Social History Narrative   Lives with wife   Social Determinants  of Health   Financial Resource Strain: Not on file  Food Insecurity: No Food Insecurity (04/24/2023)   Hunger Vital Sign    Worried About Running Out of Food in the Last Year: Never true    Ran Out of Food in the Last Year: Never true  Transportation Needs: No Transportation Needs (04/24/2023)   PRAPARE - Administrator, Civil Service (Medical): No    Lack of Transportation (Non-Medical): No  Physical Activity: Not  on file  Stress: Not on file  Social Connections: Not on file   Family History  Problem Relation Age of Onset   Stroke Mother    Diabetes Father    Stroke Sister    Diabetes Sister    - negative except otherwise stated in the family history section No Known Allergies Prior to Admission medications   Medication Sig Start Date End Date Taking? Authorizing Provider  acetaminophen (TYLENOL) 325 MG tablet Take 2 tablets (650 mg total) by mouth every 6 (six) hours as needed for mild pain, fever or headache. 04/22/23  Yes Lonia Blood, MD  aspirin EC 81 MG tablet Take 1 tablet (81 mg total) by mouth daily. Swallow whole. 01/20/23  Yes Lanae Boast, MD  Ensure Max Protein (ENSURE MAX PROTEIN) LIQD Take 330 mLs (11 oz total) by mouth 2 (two) times daily. Patient taking differently: Take 11 oz by mouth daily. 08/28/22  Yes Dahal, Melina Schools, MD  insulin aspart (NOVOLOG) 100 UNIT/ML FlexPen Inject 8 Units into the skin 3 (three) times daily with meals. Only take if eating a meal AND Blood Glucose (BG) is 80 or higher. 04/22/23 07/21/23 Yes Lonia Blood, MD  insulin detemir (LEVEMIR) 100 UNIT/ML FlexPen Inject 20 Units into the skin at bedtime. DO NOT TAKE if your blood sugars and consistently running < 120 04/23/23  Yes McClung, Elpidio Eric, MD  JARDIANCE 10 MG TABS tablet Take 10 mg by mouth daily. 04/09/23  Yes [provider]  polyethylene glycol (MIRALAX / GLYCOLAX) 17 g packet Take 17 g by mouth daily. Patient taking differently: Take 17 g by mouth daily as needed for mild constipation. 03/05/22  Yes Rolly Salter, MD  rosuvastatin (CRESTOR) 5 MG tablet Take 5 mg by mouth 2 (two) times a week. 02/07/23  Yes [provider]  barrier cream (NON-SPECIFIED) CREA Apply 1 Application topically 2 (two) times daily as needed. Patient not taking: Reported on 06/19/2023 05/04/23   Standley Brooking, MD  Blood Glucose Monitoring Suppl DEVI 1 each by Does not apply route 3 (three) times daily.  May dispense any manufacturer covered by patient's insurance. 01/20/23   Lanae Boast, MD  BRILINTA 90 MG TABS tablet Take 90 mg by mouth 2 (two) times daily. Patient not taking: Reported on 06/19/2023 04/09/23   [provider]  Glucagon, rDNA, (GLUCAGON EMERGENCY) 1 MG KIT Inject 1 mg into the skin as needed for up to 2 doses (Severe low blood sugar). Patient not taking: Reported on 06/19/2023 01/20/23   Lanae Boast, MD  Glucose Blood (BLOOD GLUCOSE TEST STRIPS) STRP 1 each by Does not apply route 3 (three) times daily. Use as directed to check blood sugar. May dispense any manufacturer covered by patient's insurance and fits patient's device. 01/20/23   Lanae Boast, MD  Insulin Pen Needle (PEN NEEDLES) 31G X 5 MM MISC 1 each by Does not apply route 3 (three) times daily. May dispense any manufacturer covered by patient's insurance. 01/20/23   Lanae Boast,  MD  Lancet Device MISC 1 each by Does not apply route 3 (three) times daily. May dispense any manufacturer covered by patient's insurance. 01/20/23   Lanae Boast, MD  Lancets MISC 1 each by Does not apply route 3 (three) times daily. Use as directed to check blood sugar. May dispense any manufacturer covered by patient's insurance and fits patient's device. 01/20/23   Lanae Boast, MD   No results found. - pertinent xrays, CT, MRI studies were reviewed and independently interpreted  Positive ROS: All other systems have been reviewed and were otherwise negative with the exception of those mentioned in the HPI and as above.  Physical Exam: General: Alert, no acute distress Psychiatric: Patient is competent for consent with normal mood and affect Lymphatic: No axillary or cervical lymphadenopathy Cardiovascular: No pedal edema Respiratory: No cyanosis, no use of accessory musculature GI: No organomegaly, abdomen is soft and non-tender    Images:  @ENCIMAGES @  Labs:  Lab Results  Component Value Date   HGBA1C 8.4 (H) 06/19/2023   HGBA1C 13.3 (H)  01/15/2023   HGBA1C 9.3 (H) 08/23/2022   ESRSEDRATE 59 (H) 06/21/2023   ESRSEDRATE 53 (H) 04/27/2020   CRP 11.6 (H) 06/19/2023   CRP 6.1 (H) 04/27/2020   REPTSTATUS 06/24/2023 FINAL 06/21/2023   GRAMSTAIN  06/21/2023    NO WBC SEEN RARE GRAM POSITIVE RODS RARE GRAM POSITIVE COCCI IN PAIRS Performed at [facility], [address] [place], [place] [address]    CULT  06/21/2023    FEW METHICILLIN RESISTANT STAPHYLOCOCCUS AUREUS RARE PSEUDOMONAS AERUGINOSA    LABORGA METHICILLIN RESISTANT STAPHYLOCOCCUS AUREUS 06/21/2023   LABORGA PSEUDOMONAS AERUGINOSA 06/21/2023    Lab Results  Component Value Date   ALBUMIN 2.7 (L) 06/22/2023   ALBUMIN 2.8 (L) 06/21/2023   ALBUMIN 2.9 (L) 06/20/2023   PREALBUMIN 19.6 02/23/2022   PREALBUMIN 11.2 (L) 04/27/2020        Latest Ref Rng & Units 06/22/2023    5:05 AM 06/21/2023    5:51 AM 06/20/2023    6:24 AM  CBC EXTENDED  WBC 4.0 - 10.5 K/uL 4.5  4.9  6.7   RBC 4.22 - 5.81 MIL/uL 3.94  4.23  4.37   Hemoglobin 13.0 - 17.0 g/dL 19.1  47.8  29.5   HCT 39.0 - 52.0 % 36.3  38.7  39.6   Platelets 150 - 400 K/uL 327  329  346   NEUT# 1.7 - 7.7 K/uL 2.3  2.5    Lymph# 0.7 - 4.0 K/uL 1.4  1.5      Neurologic: Patient does not have protective sensation bilateral lower extremities.   MUSCULOSKELETAL:   Skin: Examination patient has a necrotic ulcer over the lateral fibula left leg.  The ulcer extends down to the fibula with exposed fibula.  Patient does not have a palpable anterior tibial or dorsalis pedis pulse.  There are no decubitus heel ulcers bilaterally.  Hemoglobin 11.5 white cell count 4.5.  Albumin 2.7 with a hemoglobin A1c of 8.4.  Review of the MRI scan shows osteomyelitis of the fibula.  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   Plan: Plan: Recommended proceeding with an above-the-knee amputation.  Patient has insufficient soft tissue for a  below-knee amputation and with patient's nonambulatory status patient would have no functional improvement with the transtibial amputation.  Plan for surgery on Friday.  Patient states he will discuss this with his wife and will call if there is  any questions.  Thank you for the consult and the opportunity to see Mr. Micheal Fana, MD [facility] [phone] 7:57 AM

## 2023-06-25 NOTE — Progress Notes (Addendum)
PROGRESS NOTE    Micheal Moore.  WUJ:[id number] DOB: 1951/06/01 DOA: 06/19/2023 PCP: [name], MD  Chief Complaint  Patient presents with   Wound Infection    Brief Narrative:   Micheal Moore. is Micheal Moore 72 y.o. male with medical history significant of CAD s/p PCI April 2024, PAD, DM-2, chronic hypotension, recent hospitalization from 8/7-8/17 for polymicrobial bacteremia-discharged home on IV antibiotics presented to the hospital on 10/2 with worsening pain/discharge from his wound in his left ankle area.  Per patient-he has had Casimiro Lienhard ulcer in this area of his posterior aspect of his left ankle for the past several months-he claims he developed it during his stay in rehab for hip fracture (bed was small-his legs were dangling out-and developed ulcers in bilateral posterior ankle areas-right also heel-left also worsened) .  Over the past several weeks-this has gradually worsened-but over the past few days-pain and discharge from the ulcer has markedly worsened.  He states the pain is intractable-10 out of 10-and reports foul-smelling discharge that is yellow color.  There is no history of fever. No history of nausea, vomiting, diarrhea, body aches or chills.  Per patient-he is usually nonambulatory at baseline since at least February 2024.    Assessment & Plan:   Principal Problem:   Osteomyelitis (HCC) Active Problems:   CAD (coronary artery disease)   Type 2 diabetes mellitus with hyperlipidemia (HCC)   PVD (peripheral vascular disease) (HCC)   Hyperlipidemia   Protein-calorie malnutrition, moderate (HCC)   Ambulatory dysfunction   MRSA infection   History of MDR Pseudomonas aeruginosa infection  Left diabetic foot infection Osteomyelitis  MRI with osteomyelitis of the fibular shaft and distal metaphysis with surrounding myositis - endosteal edema in distal tibia - abnormal edema and mild enhancement posteriorly in the calcaneus suspicious for early osteo (see  report) Appreciate ortho recs -> no immediate surgical indication, recommending ID consult, wet to dry dressing changes ABI pending ID consult -> deep tissue swab (MRSA, pseudomonas), ABI pending -- dapto and merrem per ID Continue broad spectrum antibiotics  Awaiting ortho planning for AKA friday   History of PAD Recent lower extremity angiogram May 2024-no significant femoropopliteal occlusive disease noted On dual antiplatelet/statin   CAD-s/p PCI to RCA April 2024 No anginal symptoms On aspirin/Brilinta/statin (suspect not on beta-blocker due to history of chronic hypotension) Appreciate cardiology assistance -> should remain on DAPT for 1 year unless plan for surgery (brilinta can be held prior to surgery if needed - 5 days prior and restarted ASAP post operatively.  Aspirin should be continued indefinitely and throughout perioperative period.  See 10/3 cardiology note.  Will need to discuss with orthopedics whether he'd prefer brilinta be be held (he's ok if we continue - discussed with Dr. Lajoyce Corners 10/8).   DM-2 Levemir 20 units/SSI/Jardiance Follow CBGs and optimize - BG's elevated, switch to carb mod diet and adjust insulin regimen as indicated   History of orthostatic hypotension Florinef   Chronic ambulatory dysfunction/nonambulatory/bedbound status Reviewed prior discharge summary from Dr. [place] Service complicated history of bilateral hip repairs-severe fear of falling and injuring himself-and apparently his chosen to be bedridden.  Per patient report-he has been bedbound since February 2024.    DVT prophylaxis: lovenox Code Status: full Family Communication: none Disposition:   Status is: Inpatient Remains inpatient appropriate because: need for ortho, ID consults   Consultants:  Ortho ID  Procedures:  none  Antimicrobials:  Anti-infectives (From admission, onward)    Start  Dose/Rate Route Frequency Ordered Stop   06/24/23 1100  meropenem (MERREM) 1 g  in sodium chloride 0.9 % 100 mL IVPB        1 g 200 mL/hr over 30 Minutes Intravenous Every 8 hours 06/24/23 0928     06/23/23 1630  ceFEPIme (MAXIPIME) 2 g in sodium chloride 0.9 % 100 mL IVPB  Status:  Discontinued        2 g 200 mL/hr over 30 Minutes Intravenous Every 8 hours 06/23/23 1535 06/24/23 0928   06/21/23 2200  metroNIDAZOLE (FLAGYL) tablet 500 mg  Status:  Discontinued        500 mg Oral Every 12 hours 06/21/23 0901 06/24/23 0927   06/21/23 1415  DAPTOmycin (CUBICIN) IVPB 700 mg/121mL premix        8 mg/kg  88.5 kg 200 mL/hr over 30 Minutes Intravenous Daily 06/21/23 1318     06/20/23 0300  vancomycin (VANCOREADY) IVPB 1500 mg/300 mL  Status:  Discontinued        1,500 mg 150 mL/hr over 120 Minutes Intravenous Every 12 hours 06/19/23 2040 06/21/23 1318   06/19/23 1615  cefTRIAXone (ROCEPHIN) 2 g in sodium chloride 0.9 % 100 mL IVPB  Status:  Discontinued        2 g 200 mL/hr over 30 Minutes Intravenous Every 24 hours 06/19/23 1608 06/23/23 1428   06/19/23 1615  metroNIDAZOLE (FLAGYL) IVPB 500 mg  Status:  Discontinued        500 mg 100 mL/hr over 60 Minutes Intravenous Every 12 hours 06/19/23 1608 06/21/23 0901   06/19/23 1430  vancomycin (VANCOCIN) IVPB 1000 mg/200 mL premix  Status:  Discontinued        1,000 mg 200 mL/hr over 60 Minutes Intravenous NOW 06/19/23 1417 06/19/23 1426   06/19/23 1430  piperacillin-tazobactam (ZOSYN) IVPB 3.375 g        3.375 g 12.5 mL/hr over 240 Minutes Intravenous NOW 06/19/23 1417 06/19/23 1520   06/19/23 1430  vancomycin (VANCOCIN) IVPB 1000 mg/200 mL premix  Status:  Discontinued       Placed in "And" Linked Group   1,000 mg 200 mL/hr over 60 Minutes Intravenous  Once 06/19/23 1426 06/19/23 2040   06/19/23 1430  vancomycin (VANCOCIN) IVPB 1000 mg/200 mL premix       Placed in "And" Linked Group   1,000 mg 200 mL/hr over 60 Minutes Intravenous  Once 06/19/23 1426 06/19/23 1539       Subjective: No complaints  today  Objective: Vitals:   06/24/23 2010 06/25/23 0404 06/25/23 0733 06/25/23 1509  BP: 112/72 121/70 (!) 158/80 127/69  Pulse: 100 96 98 99  Resp:  19    Temp: 98.4 F (36.9 C) 98.4 F (36.9 C) 98 F (36.7 C)   TempSrc: Oral Oral Oral   SpO2: 100% 99% 100% 100%  Weight:      Height:        Intake/Output Summary (Last 24 hours) at 06/25/2023 1956 Last data filed at 06/25/2023 1802 Gross per 24 hour  Intake --  Output 3324 ml  Net -3324 ml   Filed Weights   06/19/23 1247 06/21/23 0418  Weight: 88.5 kg 88.5 kg    Examination:  General: No acute distress. Cardiovascular: RRR Lungs: unlabored Neurological: Alert and oriented 3. Moves all extremities 4 . Cranial nerves II through XII grossly intact. Extremities: dressing to LEE   Data Reviewed: I have personally reviewed following labs and imaging studies  CBC: Recent Labs  Lab  06/19/23 1353 06/19/23 1759 06/20/23 0624 06/21/23 0551 06/22/23 0505  WBC 6.8 6.8 6.7 4.9 4.5  NEUTROABS  --   --   --  2.5 2.3  HGB 13.1 12.7* 12.7* 12.3* 11.5*  HCT 41.1 40.0 39.6 38.7* 36.3*  MCV 91.9 92.0 90.6 91.5 92.1  PLT 362 377 346 329 327    Basic Metabolic Panel: Recent Labs  Lab 06/19/23 1803 06/20/23 0624 06/21/23 0551 06/22/23 0505  NA 134* 130* 132* 136  K 4.0 3.8 3.9 4.1  CL 98 95* 98 101  CO2 22 22 21* 26  GLUCOSE 149* 202* 157* 212*  BUN 18 20 19 22   CREATININE 0.89 0.92 0.64 0.87  CALCIUM 9.0 8.9 8.6* 8.8*  MG  --   --  2.2 2.2  PHOS  --   --  3.9 4.0    GFR: Estimated Creatinine Clearance: 96.1 mL/min (by C-G formula based on SCr of 0.87 mg/dL).  Liver Function Tests: Recent Labs  Lab 06/20/23 0624 06/21/23 0551 06/22/23 0505  AST 12* 13* 12*  ALT 12 10 11   ALKPHOS 61 55 56  BILITOT 0.4 0.5 0.1*  PROT 7.4 6.9 6.7  ALBUMIN 2.9* 2.8* 2.7*    CBG: Recent Labs  Lab 06/24/23 1628 06/24/23 2012 06/25/23 0735 06/25/23 1325 06/25/23 1745  GLUCAP 212* 267* 183* 205* 240*     Recent  Results (from the past 240 hour(s))  Blood culture (routine x 2)     Status: None   Collection Time: 06/19/23  1:12 PM   Specimen: BLOOD  Result Value Ref Range Status   Specimen Description BLOOD RIGHT ANTECUBITAL  Final   Special Requests   Final    BOTTLES DRAWN AEROBIC ONLY Blood Culture adequate volume   Culture   Final    NO GROWTH 5 DAYS Performed at [facility], [address] [place], [place] [address]    Report Status 06/24/2023 FINAL  Final  Blood culture (routine x 2)     Status: None   Collection Time: 06/19/23  1:53 PM   Specimen: BLOOD LEFT ARM  Result Value Ref Range Status   Specimen Description BLOOD LEFT ARM  Final   Special Requests   Final    BOTTLES DRAWN AEROBIC AND ANAEROBIC Blood Culture results may not be optimal due to an inadequate volume of blood received in culture bottles   Culture   Final    NO GROWTH 5 DAYS Performed at [facility], [address] [place], [place] [address]    Report Status 06/24/2023 FINAL  Final  Aerobic Culture w Gram Stain (superficial specimen)     Status: None   Collection Time: 06/21/23 10:31 AM   Specimen: Wound  Result Value Ref Range Status   Specimen Description WOUND  Final   Special Requests LEFT LEG  Final   Gram Stain   Final    NO WBC SEEN RARE GRAM POSITIVE RODS RARE GRAM POSITIVE COCCI IN PAIRS Performed at [facility], [address], [place], [place] [address]    Culture   Final    FEW METHICILLIN RESISTANT STAPHYLOCOCCUS AUREUS RARE PSEUDOMONAS AERUGINOSA    Report Status 06/24/2023 FINAL  Final   Organism ID, Bacteria METHICILLIN RESISTANT STAPHYLOCOCCUS AUREUS  Final   Organism ID, Bacteria PSEUDOMONAS AERUGINOSA  Final      Susceptibility   Methicillin resistant staphylococcus aureus - MIC*    CIPROFLOXACIN >=8 RESISTANT Resistant     ERYTHROMYCIN >=8  RESISTANT Resistant     GENTAMICIN <=0.5 SENSITIVE Sensitive     OXACILLIN >=4 RESISTANT Resistant     TETRACYCLINE <=1  SENSITIVE Sensitive     VANCOMYCIN <=0.5 SENSITIVE Sensitive     TRIMETH/SULFA <=10 SENSITIVE Sensitive     CLINDAMYCIN RESISTANT Resistant     RIFAMPIN <=0.5 SENSITIVE Sensitive     Inducible Clindamycin POSITIVE Resistant     LINEZOLID 2 SENSITIVE Sensitive     * FEW METHICILLIN RESISTANT STAPHYLOCOCCUS AUREUS   Pseudomonas aeruginosa - MIC*    CEFTAZIDIME 16 INTERMEDIATE Intermediate     CIPROFLOXACIN 1 INTERMEDIATE Intermediate     GENTAMICIN <=1 SENSITIVE Sensitive     IMIPENEM 1 SENSITIVE Sensitive     * RARE PSEUDOMONAS AERUGINOSA         Radiology Studies: No results found.      Scheduled Meds:  aspirin EC  81 mg Oral Daily   empagliflozin  10 mg Oral Daily   enoxaparin (LOVENOX) injection  40 mg Subcutaneous Q24H   feeding supplement  237 mL Oral BID BM   fludrocortisone  0.2 mg Oral Daily   Gerhardt's butt cream   Topical BID   insulin aspart  0-9 Units Subcutaneous TID WC   insulin aspart  5 Units Subcutaneous TID WC   insulin detemir  23 Units Subcutaneous QHS   polyethylene glycol  17 g Oral Daily   rosuvastatin  5 mg Oral Once per day on Monday Thursday   senna  1 tablet Oral BID   tamsulosin  0.4 mg Oral QPC supper   ticagrelor  90 mg Oral BID   Continuous Infusions:  DAPTOmycin 700 mg (06/25/23 1558)   meropenem (MERREM) IV 1 g (06/25/23 1412)     LOS: 6 days    Time spent: over 30 min    Lacretia Nicks, MD [facility]   To contact the attending provider between 7A-7P or the covering provider during after hours 7P-7A, please log into the web site [email or web address] and access using universal [place] password for that web site. If you do not have the password, please call the hospital operator.  06/25/2023, 7:56 PM

## 2023-06-25 NOTE — Plan of Care (Signed)
Problem: Education: Goal: Understanding of CV disease, CV risk reduction, and recovery process will improve Outcome: Progressing Goal: Individualized Educational Video(s) Outcome: Progressing   Problem: Activity: Goal: Ability to return to baseline activity level will improve Outcome: Progressing   Problem: Cardiovascular: Goal: Ability to achieve and maintain adequate cardiovascular perfusion will improve Outcome: Progressing Goal: Vascular access site(s) Level 0-1 will be maintained Outcome: Progressing   Problem: Health Behavior/Discharge Planning: Goal: Ability to safely manage health-related needs after discharge will improve Outcome: Progressing   Problem: Education: Goal: Ability to describe self-care measures that may prevent or decrease complications (Diabetes Survival Skills Education) will improve Outcome: Progressing Goal: Individualized Educational Video(s) Outcome: Progressing   Problem: Cardiac: Goal: Ability to maintain an adequate cardiac output will improve Outcome: Progressing   Problem: Health Behavior/Discharge Planning: Goal: Ability to identify and utilize available resources and services will improve Outcome: Progressing Goal: Ability to manage health-related needs will improve Outcome: Progressing   Problem: Fluid Volume: Goal: Ability to achieve a balanced intake and output will improve Outcome: Progressing   Problem: Metabolic: Goal: Ability to maintain appropriate glucose levels will improve Outcome: Progressing   Problem: Nutritional: Goal: Maintenance of adequate nutrition will improve Outcome: Progressing Goal: Maintenance of adequate weight for body size and type will improve Outcome: Progressing   Problem: Respiratory: Goal: Will regain and/or maintain adequate ventilation Outcome: Progressing   Problem: Urinary Elimination: Goal: Ability to achieve and maintain adequate renal perfusion and functioning will improve Outcome:  Progressing   Problem: Education: Goal: Knowledge of the prescribed therapeutic regimen will improve Outcome: Progressing Goal: Understanding of discharge needs will improve Outcome: Progressing Goal: Individualized Educational Video(s) Outcome: Progressing   Problem: Activity: Goal: Ability to avoid complications of mobility impairment will improve Outcome: Progressing Goal: Ability to tolerate increased activity will improve Outcome: Progressing   Problem: Clinical Measurements: Goal: Postoperative complications will be avoided or minimized Outcome: Progressing   Problem: Pain Management: Goal: Pain level will decrease with appropriate interventions Outcome: Progressing   Problem: Skin Integrity: Goal: Will show signs of wound healing Outcome: Progressing   Problem: Education: Goal: Ability to describe self-care measures that may prevent or decrease complications (Diabetes Survival Skills Education) will improve Outcome: Progressing Goal: Individualized Educational Video(s) Outcome: Progressing   Problem: Coping: Goal: Ability to adjust to condition or change in health will improve Outcome: Progressing   Problem: Fluid Volume: Goal: Ability to maintain a balanced intake and output will improve Outcome: Progressing   Problem: Health Behavior/Discharge Planning: Goal: Ability to identify and utilize available resources and services will improve Outcome: Progressing Goal: Ability to manage health-related needs will improve Outcome: Progressing   Problem: Metabolic: Goal: Ability to maintain appropriate glucose levels will improve Outcome: Progressing   Problem: Nutritional: Goal: Maintenance of adequate nutrition will improve Outcome: Progressing Goal: Progress toward achieving an optimal weight will improve Outcome: Progressing   Problem: Skin Integrity: Goal: Risk for impaired skin integrity will decrease Outcome: Progressing   Problem: Tissue  Perfusion: Goal: Adequacy of tissue perfusion will improve Outcome: Progressing   Problem: Education: Goal: Knowledge of General Education information will improve Description: Including pain rating scale, medication(s)/side effects and non-pharmacologic comfort measures Outcome: Progressing   Problem: Health Behavior/Discharge Planning: Goal: Ability to manage health-related needs will improve Outcome: Progressing   Problem: Clinical Measurements: Goal: Ability to maintain clinical measurements within normal limits will improve Outcome: Progressing Goal: Will remain free from infection Outcome: Progressing Goal: Diagnostic test results will improve Outcome: Progressing Goal: Respiratory complications will  improve Outcome: Progressing Goal: Cardiovascular complication will be avoided Outcome: Progressing   Problem: Activity: Goal: Risk for activity intolerance will decrease Outcome: Progressing   Problem: Nutrition: Goal: Adequate nutrition will be maintained Outcome: Progressing

## 2023-06-26 DIAGNOSIS — I739 Peripheral vascular disease, unspecified: Secondary | ICD-10-CM | POA: Diagnosis not present

## 2023-06-26 DIAGNOSIS — R262 Difficulty in walking, not elsewhere classified: Secondary | ICD-10-CM

## 2023-06-26 DIAGNOSIS — L089 Local infection of the skin and subcutaneous tissue, unspecified: Secondary | ICD-10-CM

## 2023-06-26 DIAGNOSIS — Z8619 Personal history of other infectious and parasitic diseases: Secondary | ICD-10-CM

## 2023-06-26 DIAGNOSIS — M86162 Other acute osteomyelitis, left tibia and fibula: Secondary | ICD-10-CM | POA: Diagnosis not present

## 2023-06-26 DIAGNOSIS — I2510 Atherosclerotic heart disease of native coronary artery without angina pectoris: Secondary | ICD-10-CM | POA: Diagnosis not present

## 2023-06-26 DIAGNOSIS — A4902 Methicillin resistant Staphylococcus aureus infection, unspecified site: Secondary | ICD-10-CM

## 2023-06-26 DIAGNOSIS — E440 Moderate protein-calorie malnutrition: Secondary | ICD-10-CM

## 2023-06-26 DIAGNOSIS — E1169 Type 2 diabetes mellitus with other specified complication: Secondary | ICD-10-CM | POA: Diagnosis not present

## 2023-06-26 DIAGNOSIS — E11628 Type 2 diabetes mellitus with other skin complications: Secondary | ICD-10-CM

## 2023-06-26 LAB — CBC WITH DIFFERENTIAL/PLATELET
Abs Immature Granulocytes: 0.03 10*3/uL (ref 0.00–0.07)
Basophils Absolute: 0.1 10*3/uL (ref 0.0–0.1)
Basophils Relative: 1 %
Eosinophils Absolute: 0.5 10*3/uL (ref 0.0–0.5)
Eosinophils Relative: 9 %
HCT: 36.8 % — ABNORMAL LOW (ref 39.0–52.0)
Hemoglobin: 11.6 g/dL — ABNORMAL LOW (ref 13.0–17.0)
Immature Granulocytes: 1 %
Lymphocytes Relative: 31 %
Lymphs Abs: 1.7 10*3/uL (ref 0.7–4.0)
MCH: 29.9 pg (ref 26.0–34.0)
MCHC: 31.5 g/dL (ref 30.0–36.0)
MCV: 94.8 fL (ref 80.0–100.0)
Monocytes Absolute: 0.5 10*3/uL (ref 0.1–1.0)
Monocytes Relative: 8 %
Neutro Abs: 2.9 10*3/uL (ref 1.7–7.7)
Neutrophils Relative %: 50 %
Platelets: 318 10*3/uL (ref 150–400)
RBC: 3.88 MIL/uL — ABNORMAL LOW (ref 4.22–5.81)
RDW: 15 % (ref 11.5–15.5)
WBC: 5.7 10*3/uL (ref 4.0–10.5)
nRBC: 0 % (ref 0.0–0.2)

## 2023-06-26 LAB — COMPREHENSIVE METABOLIC PANEL
ALT: 33 U/L (ref 0–44)
AST: 28 U/L (ref 15–41)
Albumin: 2.7 g/dL — ABNORMAL LOW (ref 3.5–5.0)
Alkaline Phosphatase: 51 U/L (ref 38–126)
Anion gap: 13 (ref 5–15)
BUN: 20 mg/dL (ref 8–23)
CO2: 22 mmol/L (ref 22–32)
Calcium: 8.9 mg/dL (ref 8.9–10.3)
Chloride: 102 mmol/L (ref 98–111)
Creatinine, Ser: 0.66 mg/dL (ref 0.61–1.24)
GFR, Estimated: >60 mL/min (ref >60)
Glucose, Bld: 193 mg/dL — ABNORMAL HIGH (ref 70–99)
Potassium: 4.3 mmol/L (ref 3.5–5.1)
Sodium: 137 mmol/L (ref 135–145)
Total Bilirubin: 0.4 mg/dL (ref 0.3–1.2)
Total Protein: 6.1 g/dL — ABNORMAL LOW (ref 6.5–8.1)

## 2023-06-26 LAB — MAGNESIUM: Magnesium: 2.4 mg/dL (ref 1.7–2.4)

## 2023-06-26 LAB — GLUCOSE, CAPILLARY
Glucose-Capillary: 195 mg/dL — ABNORMAL HIGH (ref 70–99)
Glucose-Capillary: 186 mg/dL — ABNORMAL HIGH (ref 70–99)
Glucose-Capillary: 227 mg/dL — ABNORMAL HIGH (ref 70–99)
Glucose-Capillary: 167 mg/dL — ABNORMAL HIGH (ref 70–99)
Glucose-Capillary: 205 mg/dL — ABNORMAL HIGH (ref 70–99)

## 2023-06-26 LAB — PHOSPHORUS: Phosphorus: 3.6 mg/dL (ref 2.5–4.6)

## 2023-06-26 NOTE — Progress Notes (Signed)
Progress Note   Patient: Micheal Moore. WNU:[id number] DOB: [birth year] DOA: 06/19/2023     7 DOS: the patient was seen and examined on 06/26/2023   Brief hospital course: Micheal Moore. is a 72 y.o. male with medical history significant of CAD s/p PCI April 2024, PAD, DM-2, chronic hypotension, recent hospitalization from 8/7-8/17 for polymicrobial bacteremia-discharged home on IV antibiotics presented to the hospital on 10/2 with worsening pain/discharge from his wound in his left ankle area. Over the past several weeks-this has gradually worsened-but over the past few days-pain and discharge from the ulcer has markedly worsened. He states the pain is intractable-10 out of 10-and reports foul-smelling discharge that is yellow color.   Orthopedic surgery team evaluated him, advised Left AKA, ID advised antibiotics until 24hr post surgery.   Assessment and Plan: Left diabetic foot infection Osteomyelitis  MRI with osteomyelitis of the fibular shaft and distal metaphysis with surrounding myositis - endosteal edema in distal tibia - abnormal edema and mild enhancement posteriorly in the calcaneus suspicious for early osteo (see report) ID consult -> deep tissue swab grew MRSA- dapto and merrem per ID Continue broad spectrum antibiotics until 24hr post aka surgery. Ortho team planning left AKA this Friday.   History of PAD Recent lower extremity angiogram May 2024-no significant femoropopliteal occlusive disease noted On dual antiplatelet/statin.    CAD-s/p PCI to RCA April 2024 No anginal symptoms On aspirin/Brilinta/statin  Not on beta-blocker due to history of chronic hypotension. DAPT for 1 year unless plan for surgery (brilinta can be held prior to surgery if needed - 5 days prior and restarted ASAP post operatively.  Aspirin should be continued indefinitely and throughout perioperative period per cardiology. Ortho ok to continue Brillinta.   Diabetes mellitus  type-2 Continue Levemir 20 units/SSI/Jardiance Carb consistent diet. Continue accucheks, sliding scale.   History of orthostatic hypotension Continue Florinef   Chronic ambulatory dysfunction/nonambulatory/bedbound status He has complicated history of bilateral hip repairs-severe fear of falling and injuring himself-and apparently his chosen to be bedridden.   Per patient report-he has been bedbound since February 2024.  Nutrition Documentation    Flowsheet Row ED to Hosp-Admission (Current) from 06/19/2023 in [facility]  Nutrition Problem Moderate Malnutrition  Etiology chronic illness  Nutrition Goal Patient will meet greater than or equal to 90% of their needs  Interventions Ensure Enlive (each supplement provides 350kcal and 20 grams of protein), Other (Comment)  [Liberalize diet]     ,  Active Pressure Injury/Wound(s)     Pressure Ulcer  Duration          Pressure Injury 01/15/23 Foot Left;Anterior Deep Tissue Pressure Injury - Purple or maroon localized area of discolored intact skin or blood-filled blister due to damage of underlying soft tissue from pressure and/or shear. 162 days   Pressure Injury 01/15/23 Heel Right;Left Stage 2 -  Partial thickness loss of dermis presenting as a shallow open injury with a red, pink wound bed without slough. dry scabbed healing pressure injuries to bilat heels 162 days           (Optional):[id number]}   Out of bed to chair. Incentive spirometry. Nursing supportive care. Fall, aspiration precautions. DVT prophylaxis   Code Status: Full Code  Subjective: Patient is seen and examined today morning. He is lying in bed. Denies any complaints. Knows the plan of amputation for Friday. States he has some time to discuss with his family about surgery.  Physical Exam: Vitals:  06/26/23 0451 06/26/23 0756 06/26/23 1524 06/26/23 1941  BP: (!) 122/92 (!) 124/95 130/85 117/67  Pulse: 98 (!) 106  100  Resp: 18 17 17 18    Temp: 98 F (36.7 C) 97.8 F (36.6 C) 98.3 F (36.8 C) 98 F (36.7 C)  TempSrc: Oral Oral Oral Oral  SpO2: 99% 100% 100% 100%  Weight:      Height:        General - Elderly Caucasian male, no apparent distress HEENT - PERRLA, EOMI, atraumatic head, non tender sinuses. Lung - Clear, bibasal rales, rhonchi, no wheezes. Heart - S1, S2 heard, no murmurs, rubs, 2+ pedal edema. Abdomen - Soft, non tender non distended, bowel sounds good Neuro - Alert, awake and oriented x 3, non focal exam. Skin - Warm and dry. Left leg wound dressing, swelling noted.  Data Reviewed:      Latest Ref Rng & Units 06/26/2023    4:37 AM 06/22/2023    5:05 AM 06/21/2023    5:51 AM  CBC  WBC 4.0 - 10.5 K/uL 5.7  4.5  4.9   Hemoglobin 13.0 - 17.0 g/dL 16.1  09.6  04.5   Hematocrit 39.0 - 52.0 % 36.8  36.3  38.7   Platelets 150 - 400 K/uL 318  327  329       Latest Ref Rng & Units 06/26/2023    4:37 AM 06/22/2023    5:05 AM 06/21/2023    5:51 AM  BMP  Glucose 70 - 99 mg/dL 409  811  914   BUN 8 - 23 mg/dL 20  22  19    Creatinine 0.61 - 1.24 mg/dL 7.82  9.56  2.13   Sodium 135 - 145 mmol/L 137  136  132   Potassium 3.5 - 5.1 mmol/L 4.3  4.1  3.9   Chloride 98 - 111 mmol/L 102  101  98   CO2 22 - 32 mmol/L 22  26  21    Calcium 8.9 - 10.3 mg/dL 8.9  8.8  8.6    No results found.   Family Communication: Discussed with patient, he understands and agrees. All questions answereed.  Disposition: Status is: Inpatient Remains inpatient appropriate because: lower extremity amputation on friday  Planned Discharge Destination: Home with Home Health     Time spent: 39 minutes  Author: Marcelino Duster, MD 06/26/2023 8:08 PM Secure chat 7am to 7pm For on call review [email or web address].

## 2023-06-26 NOTE — Plan of Care (Signed)
  Problem: Education: Goal: Understanding of CV disease, CV risk reduction, and recovery process will improve Outcome: Progressing   Problem: Activity: Goal: Ability to return to baseline activity level will improve Outcome: Progressing   Problem: Cardiovascular: Goal: Ability to achieve and maintain adequate cardiovascular perfusion will improve Outcome: Progressing   Problem: Health Behavior/Discharge Planning: Goal: Ability to safely manage health-related needs after discharge will improve Outcome: Progressing   Problem: Education: Goal: Ability to describe self-care measures that may prevent or decrease complications (Diabetes Survival Skills Education) will improve Outcome: Progressing   Problem: Cardiac: Goal: Ability to maintain an adequate cardiac output will improve Outcome: Progressing   Problem: Health Behavior/Discharge Planning: Goal: Ability to identify and utilize available resources and services will improve Outcome: Progressing   Problem: Fluid Volume: Goal: Ability to achieve a balanced intake and output will improve Outcome: Progressing   Problem: Metabolic: Goal: Ability to maintain appropriate glucose levels will improve Outcome: Progressing   Problem: Nutritional: Goal: Maintenance of adequate nutrition will improve Outcome: Progressing   Problem: Respiratory: Goal: Will regain and/or maintain adequate ventilation Outcome: Progressing   Problem: Education: Goal: Knowledge of the prescribed therapeutic regimen will improve Outcome: Progressing   Problem: Activity: Goal: Ability to avoid complications of mobility impairment will improve Outcome: Progressing   Problem: Clinical Measurements: Goal: Postoperative complications will be avoided or minimized Outcome: Progressing   Problem: Pain Management: Goal: Pain level will decrease with appropriate interventions Outcome: Progressing   Problem: Skin Integrity: Goal: Will show signs of  wound healing Outcome: Progressing   Problem: Education: Goal: Ability to describe self-care measures that may prevent or decrease complications (Diabetes Survival Skills Education) will improve Outcome: Progressing   Problem: Coping: Goal: Ability to adjust to condition or change in health will improve Outcome: Progressing   Problem: Fluid Volume: Goal: Ability to maintain a balanced intake and output will improve Outcome: Progressing   Problem: Health Behavior/Discharge Planning: Goal: Ability to identify and utilize available resources and services will improve Outcome: Progressing   Problem: Metabolic: Goal: Ability to maintain appropriate glucose levels will improve Outcome: Progressing   Problem: Nutritional: Goal: Maintenance of adequate nutrition will improve Outcome: Progressing   Problem: Skin Integrity: Goal: Risk for impaired skin integrity will decrease Outcome: Progressing   Problem: Tissue Perfusion: Goal: Adequacy of tissue perfusion will improve Outcome: Progressing   Problem: Education: Goal: Knowledge of General Education information will improve Description: Including pain rating scale, medication(s)/side effects and non-pharmacologic comfort measures Outcome: Progressing   Problem: Health Behavior/Discharge Planning: Goal: Ability to manage health-related needs will improve Outcome: Progressing   Problem: Clinical Measurements: Goal: Ability to maintain clinical measurements within normal limits will improve Outcome: Progressing Goal: Will remain free from infection Outcome: Progressing Goal: Diagnostic test results will improve Outcome: Progressing   Problem: Activity: Goal: Risk for activity intolerance will decrease Outcome: Progressing   Problem: Nutrition: Goal: Adequate nutrition will be maintained Outcome: Progressing

## 2023-06-26 NOTE — Inpatient Diabetes Management (Signed)
Inpatient Diabetes Program Recommendations  AACE/ADA: New Consensus Statement on Inpatient Glycemic Control   Target Ranges:  Prepandial:   less than 140 mg/dL      Peak postprandial:   less than 180 mg/dL (1-2 hours)      Critically ill patients:  140 - 180 mg/dL    Latest Reference Range & Units 06/25/23 07:35 06/25/23 13:25 06/25/23 17:45 06/25/23 20:08 06/26/23 04:48 06/26/23 07:55  Glucose-Capillary 70 - 99 mg/dL 409 (H) 811 (H) 914 (H) 226 (H) 195 (H) 186 (H)   Review of Glycemic Control  Diabetes history: DM2 Outpatient Diabetes medications: Levemir 20 units at bedtime, Novolog 8 units TID with meals Current orders for Inpatient glycemic control: Levemir 23 units at bedtime, Novolog 5 units TID with meals, Novolog 0-9 units TID with meals, Jardiance 10 mg daily  Inpatient Diabetes Program Recommendations:    Insulin: Please consider increasing meal coverage to Novolog 8 units TID.  Thanks, Orlando Penner, RN, MSN, CDCES Diabetes Coordinator Inpatient Diabetes Program [phone] (Team Pager from 8am to 5pm)

## 2023-06-27 DIAGNOSIS — E1169 Type 2 diabetes mellitus with other specified complication: Secondary | ICD-10-CM | POA: Diagnosis not present

## 2023-06-27 DIAGNOSIS — I739 Peripheral vascular disease, unspecified: Secondary | ICD-10-CM | POA: Diagnosis not present

## 2023-06-27 DIAGNOSIS — I2510 Atherosclerotic heart disease of native coronary artery without angina pectoris: Secondary | ICD-10-CM | POA: Diagnosis not present

## 2023-06-27 DIAGNOSIS — M86162 Other acute osteomyelitis, left tibia and fibula: Secondary | ICD-10-CM | POA: Diagnosis not present

## 2023-06-27 LAB — CBC WITH DIFFERENTIAL/PLATELET
Abs Immature Granulocytes: 0.03 10*3/uL (ref 0.00–0.07)
Basophils Absolute: 0.1 10*3/uL (ref 0.0–0.1)
Basophils Relative: 1 %
Eosinophils Absolute: 0.5 10*3/uL (ref 0.0–0.5)
Eosinophils Relative: 9 %
HCT: 38.3 % — ABNORMAL LOW (ref 39.0–52.0)
Hemoglobin: 12.2 g/dL — ABNORMAL LOW (ref 13.0–17.0)
Immature Granulocytes: 1 %
Lymphocytes Relative: 28 %
Lymphs Abs: 1.5 10*3/uL (ref 0.7–4.0)
MCH: 30.8 pg (ref 26.0–34.0)
MCHC: 31.9 g/dL (ref 30.0–36.0)
MCV: 96.7 fL (ref 80.0–100.0)
Monocytes Absolute: 0.3 10*3/uL (ref 0.1–1.0)
Monocytes Relative: 6 %
Neutro Abs: 3.1 10*3/uL (ref 1.7–7.7)
Neutrophils Relative %: 55 %
Platelets: 336 10*3/uL (ref 150–400)
RBC: 3.96 MIL/uL — ABNORMAL LOW (ref 4.22–5.81)
RDW: 15.1 % (ref 11.5–15.5)
WBC: 5.5 10*3/uL (ref 4.0–10.5)
nRBC: 0 % (ref 0.0–0.2)

## 2023-06-27 LAB — GLUCOSE, CAPILLARY
Glucose-Capillary: 153 mg/dL — ABNORMAL HIGH (ref 70–99)
Glucose-Capillary: 164 mg/dL — ABNORMAL HIGH (ref 70–99)
Glucose-Capillary: 210 mg/dL — ABNORMAL HIGH (ref 70–99)
Glucose-Capillary: 223 mg/dL — ABNORMAL HIGH (ref 70–99)

## 2023-06-27 LAB — PREALBUMIN: Prealbumin: 20 mg/dL (ref 18–38)

## 2023-06-27 LAB — COMPREHENSIVE METABOLIC PANEL
ALT: 30 U/L (ref 0–44)
AST: 28 U/L (ref 15–41)
Albumin: 3 g/dL — ABNORMAL LOW (ref 3.5–5.0)
Alkaline Phosphatase: 52 U/L (ref 38–126)
Anion gap: 17 — ABNORMAL HIGH (ref 5–15)
BUN: 20 mg/dL (ref 8–23)
CO2: 26 mmol/L (ref 22–32)
Calcium: 9.3 mg/dL (ref 8.9–10.3)
Chloride: 97 mmol/L — ABNORMAL LOW (ref 98–111)
Creatinine, Ser: 0.7 mg/dL (ref 0.61–1.24)
GFR, Estimated: >60 mL/min (ref >60)
Glucose, Bld: 211 mg/dL — ABNORMAL HIGH (ref 70–99)
Potassium: 4.2 mmol/L (ref 3.5–5.1)
Sodium: 140 mmol/L (ref 135–145)
Total Bilirubin: 0.5 mg/dL (ref 0.3–1.2)
Total Protein: 6.5 g/dL (ref 6.5–8.1)

## 2023-06-27 LAB — MRSA NEXT GEN BY PCR, NASAL: MRSA by PCR Next Gen: DETECTED — AB

## 2023-06-27 MED ORDER — TRANEXAMIC ACID-NACL 1000-0.7 MG/100ML-% IV SOLN
1000.0000 mg | INTRAVENOUS | Status: AC
  Administered 2023-06-28: 1000 mg via INTRAVENOUS
  Filled 2023-06-27: qty 100

## 2023-06-27 MED ORDER — TRANEXAMIC ACID 1000 MG/10ML IV SOLN
2000.0000 mg | INTRAVENOUS | Status: DC
  Filled 2023-06-27: qty 20

## 2023-06-27 MED ORDER — POVIDONE-IODINE 10 % EX SWAB
2.0000 | Freq: Once | CUTANEOUS | Status: AC
  Administered 2023-06-28: 2 via TOPICAL

## 2023-06-27 MED ORDER — VANCOMYCIN HCL IN DEXTROSE 1-5 GM/200ML-% IV SOLN
1000.0000 mg | INTRAVENOUS | Status: AC
  Administered 2023-06-28: 1000 mg via INTRAVENOUS
  Filled 2023-06-27 (×2): qty 200

## 2023-06-27 MED ORDER — CHLORHEXIDINE GLUCONATE 4 % EX SOLN
60.0000 mL | Freq: Once | CUTANEOUS | Status: AC
  Administered 2023-06-28: 4 via TOPICAL
  Filled 2023-06-27: qty 60

## 2023-06-27 MED ORDER — CEFAZOLIN SODIUM-DEXTROSE 2-4 GM/100ML-% IV SOLN
2.0000 g | INTRAVENOUS | Status: AC
  Administered 2023-06-28: 2 g via INTRAVENOUS
  Filled 2023-06-27 (×2): qty 100

## 2023-06-27 NOTE — Anesthesia Preprocedure Evaluation (Signed)
Anesthesia Evaluation  Patient identified by MRN, date of birth, ID band Patient awake    Reviewed: Allergy & Precautions, H&P , Patient's Chart, lab work & pertinent test results  Airway Mallampati: II  TM Distance: >3 FB Neck ROM: Full    Dental  (+) Poor Dentition, Chipped, Missing, Dental Advisory Given   Pulmonary neg pulmonary ROS, pneumonia, Current Smoker    + decreased breath sounds      Cardiovascular Exercise Tolerance: Good + CAD, + Past MI, + Peripheral Vascular Disease and +CHF  negative cardio ROS  Rhythm:Regular Rate:Normal  ECHO 24    1. No obvious vegetations, but valves not seen well enough to rule out.  Would recommended TEE if clinically indicated.   2. Left ventricular ejection fraction, by estimation, is 65 to 70%. The  left ventricle has normal function.   3. Right ventricular systolic function is normal. The right ventricular  size is normal.   4. Trivial mitral valve regurgitation.   5. Aortic valve regurgitation is not visualized.   6. The inferior vena cava is normal in size with greater than 50%  respiratory variability, suggesting right atrial pressure of 3 mmHg.     Neuro/Psych negative neurological ROS  negative psych ROS   GI/Hepatic negative GI ROS, Neg liver ROS,,,  Endo/Other  negative endocrine ROSdiabetes, Type 2, Insulin Dependent, Oral Hypoglycemic Agents    Renal/GU negative Renal ROS  negative genitourinary   Musculoskeletal negative musculoskeletal ROS (+)    Abdominal   Peds  Hematology negative hematology ROS (+) Blood dyscrasia, anemia   Anesthesia Other Findings   Reproductive/Obstetrics negative OB ROS                             Anesthesia Physical Anesthesia Plan  ASA: 4  Anesthesia Plan: General   Post-op Pain Management: Minimal or no pain anticipated   Induction: Intravenous  PONV Risk Score and Plan: 2 and Ondansetron,  Dexamethasone and Treatment may vary due to age or medical condition  Airway Management Planned: LMA and Oral ETT  Additional Equipment: None  Intra-op Plan:   Post-operative Plan: Extubation in OR  Informed Consent: I have reviewed the patients History and Physical, chart, labs and discussed the procedure including the risks, benefits and alternatives for the proposed anesthesia with the patient or authorized representative who has indicated his/her understanding and acceptance.       Plan Discussed with: CRNA and Anesthesiologist  Anesthesia Plan Comments: (  )       Anesthesia Quick Evaluation

## 2023-06-27 NOTE — Progress Notes (Signed)
Physical Therapy Treatment Patient Details Name: Micheal Moore. MRN: [id number] DOB: 10/26/1950 Today's Date: 06/27/2023   History of Present Illness Micheal Moore. is a 72 y.o. male who  presented to the hospital on 10/2 with worsening pain/discharge from his wound in his left ankle area. Admitted for Osteomyelitis. Multiple recent admissions: 8/7-8/17 for polymicrobial bacteremia-discharged home on IV antibiotics and 7/24 - 8/5 due to bacteremia secondary to MRSA and Providencia, complicated by PNA. Plan for left AKA Fri Oct 11. 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 Pt able to come to siting up EOB with mod A to manage LLE and elevate trunk as pt limited by pain and UE weakness. Pt continues to demonstrate posterior lean in sitting due to limited hip/knee flexion requiring consistent min A to maintain sitting balance and multimodal cues for upright trunk. Pt able to maintain sitting ~3 mins before requesting to lay back down due to LLE pain. Pt able to complete LE therex in supine with cues for technique and hands on assist for increased ROM. Pt educated in exercises to complete between therapies and throughout day. Note plan is for left AKA on Fri, 10/11. Current plan continues to remain appropriate. Will continue to follow acutely.    If plan is discharge home, recommend the following: Two people to help with walking and/or transfers;A lot of help with bathing/dressing/bathroom;Assistance with cooking/housework;Assist for transportation;Help with stairs or ramp for entrance   Can travel by private vehicle     No  Equipment Recommendations  Hoyer lift;Wheelchair (measurements PT);Wheelchair cushion (measurements PT) (amputee wheelchair)     Recommendations for Other Services       Precautions / Restrictions Precautions Precautions: Fall Precaution Comments: multiple pressure injuries, back, heels     Mobility  Bed Mobility Overal bed mobility: Needs Assistance Bed Mobility: Rolling, Sidelying to Sit, Sit to Supine Rolling: Contact guard assist   Supine to sit: Mod assist Sit to supine: Mod assist   General bed mobility comments: Heavy use of bed rails and bed controls with HHA to eleavte trunk to sitting, able to roll R/L without physical assist,    Transfers Overall transfer level: Needs assistance                 General transfer comment: declined    Ambulation/Gait                   Stairs             Wheelchair Mobility     Tilt Bed    Modified Rankin (Stroke Patients Only)       Balance Overall balance assessment: Needs assistance Sitting-balance support: Single extremity supported, Feet supported Sitting balance-[name]: Poor Sitting balance - Comments: posterior bias due to poor spinal and hip mobility                                    Cognition Arousal: Alert Behavior During Therapy: WFL for tasks assessed/performed Overall Cognitive Status: No family/caregiver present to determine baseline cognitive functioning Area of Impairment: Attention, Memory, Following commands, Safety/judgement, Awareness, Problem solving  Current Attention Level: Sustained Memory: Decreased recall of precautions, Decreased short-term memory Following Commands: Follows one step commands consistently Safety/Judgement: Decreased awareness of deficits, Decreased awareness of safety Awareness: Intellectual Problem Solving: Requires verbal cues General Comments: likely baseline, very particular about his bed pads. needs encouragement to participate.        Exercises General Exercises - Lower Extremity Ankle Circles/Pumps: AROM, Both, 10  reps, Seated Heel Slides: AROM, Right, 10 reps, Supine Hip Flexion/Marching: AAROM, Right, 10 reps, Supine    General Comments General comments (skin integrity, edema, etc.): VSS on RA      Pertinent Vitals/Pain Pain Assessment Pain Assessment: Faces Faces Pain Scale: Hurts a little bit Pain Location: LLE above knee Pain Descriptors / Indicators: Discomfort, Sore Pain Intervention(s): Limited activity within patient's tolerance, Monitored during session, Repositioned    Home Living                          Prior Function            PT Goals (current goals can now be found in the care plan section) Acute Rehab PT Goals Patient Stated Goal: to go home with caregivers PT Goal Formulation: With patient Time For Goal Achievement: 07/06/23 Progress towards PT goals: Progressing toward goals    Frequency    Min 1X/week      PT Plan      Co-evaluation              AM-PAC PT "6 Clicks" Mobility   Outcome Measure  Help needed turning from your back to your side while in a flat bed without using bedrails?: A Little Help needed moving from lying on your back to sitting on the side of a flat bed without using bedrails?: A Lot Help needed moving to and from a bed to a chair (including a wheelchair)?: Total Help needed standing up from a chair using your arms (e.g., wheelchair or bedside chair)?: Total Help needed to walk in hospital room?: Total Help needed climbing 3-5 steps with a railing? : Total 6 Click Score: 9    End of Session   Activity Tolerance: Patient limited by fatigue;Patient limited by pain Patient left: in bed;with call bell/phone within reach;with nursing/sitter in room Nurse Communication: Mobility status PT Visit Diagnosis: Repeated falls (R29.6);Muscle weakness (generalized) (M62.81);Other abnormalities of gait and mobility (R26.89)     Time: 4098-1191 PT Time Calculation (min) (ACUTE ONLY): 20 min  Charges:    $Therapeutic  Activity: 8-22 mins PT General Charges $$ ACUTE PT VISIT: 1 Visit                     Harrie Cazarez R. PTA Acute Rehabilitation Services Office: [phone]   Catalina Antigua 06/27/2023, 8:51 AM

## 2023-06-27 NOTE — Progress Notes (Signed)
Progress Note   Patient: Micheal Moore. WJX:[id number] DOB: [birth year] DOA: 06/19/2023     8 DOS: the patient was seen and examined on 06/27/2023   Brief hospital course: Micheal Rensel. is a 72 y.o. male with medical history significant of CAD s/p PCI April 2024, PAD, DM-2, chronic hypotension, recent hospitalization from 8/7-8/17 for polymicrobial bacteremia-discharged home on IV antibiotics presented to the hospital on 10/2 with worsening pain/discharge from his wound in his left ankle area. Over the past several weeks-this has gradually worsened-but over the past few days-pain and discharge from the ulcer has markedly worsened. He states the pain is intractable-10 out of 10-and reports foul-smelling discharge that is yellow color.   Orthopedic surgery team evaluated him, advised Left AKA, ID advised antibiotics until 24hr post surgery.   Assessment and Plan: Left diabetic foot infection Osteomyelitis  MRI with osteomyelitis of the fibular shaft and distal metaphysis with surrounding myositis - endosteal edema in distal tibia - abnormal edema and mild enhancement posteriorly in the calcaneus suspicious for early osteo (see report) ID consult -> deep tissue swab grew MRSA- dapto and merrem per ID Continue broad spectrum antibiotics until 24hr post aka surgery. Ortho team planning left AKA this Friday.   History of PAD Recent lower extremity angiogram May 2024-no significant femoropopliteal occlusive disease noted On dual antiplatelet/statin.    CAD-s/p PCI to RCA April 2024 No anginal symptoms On aspirin/Brilinta/statin  Not on beta-blocker due to history of chronic hypotension. DAPT for 1 year unless plan for surgery (brilinta can be held prior to surgery if needed - 5 days prior and restarted ASAP post operatively.  Aspirin should be continued indefinitely and throughout perioperative period per cardiology. Ortho ok to continue Brillinta.   Diabetes mellitus  type-2 Continue Levemir 20 units/SSI/Jardiance Carb consistent diet. Continue accucheks, sliding scale.   History of orthostatic hypotension Continue Florinef   Chronic ambulatory dysfunction/nonambulatory/bedbound status He has complicated history of bilateral hip repairs-severe fear of falling and injuring himself-and apparently his chosen to be bedridden.   Per patient report-he has been bedbound since February 2024.  Nutrition Documentation    Flowsheet Row ED to Hosp-Admission (Current) from 06/19/2023 in [facility]  Nutrition Problem Moderate Malnutrition  Etiology chronic illness  Nutrition Goal Patient will meet greater than or equal to 90% of their needs  Interventions Ensure Enlive (each supplement provides 350kcal and 20 grams of protein), Other (Comment)  [Liberalize diet]     ,  Active Pressure Injury/Wound(s)     Pressure Ulcer  Duration          Pressure Injury 01/15/23 Foot Left;Anterior Deep Tissue Pressure Injury - Purple or maroon localized area of discolored intact skin or blood-filled blister due to damage of underlying soft tissue from pressure and/or shear. 162 days   Pressure Injury 01/15/23 Heel Right;Left Stage 2 -  Partial thickness loss of dermis presenting as a shallow open injury with a red, pink wound bed without slough. dry scabbed healing pressure injuries to bilat heels 162 days           (Optional):26781}   Out of bed to chair. Incentive spirometry. Nursing supportive care. Fall, aspiration precautions. DVT prophylaxis   Code Status: Full Code  Subjective: Patient is seen and examined today morning. He is lying in bed. Denies any complaints. States he does not want to think about surgery. No overnight issues.  Physical Exam: Vitals:   06/26/23 0756 06/26/23 1524 06/26/23 1941  06/27/23 0903  BP: (!) 124/95 130/85 117/67 127/79  Pulse: (!) 106  100 (!) 113  Resp: 17 17 18 18   Temp: 97.8 F (36.6 C) 98.3 F (36.8  C) 98 F (36.7 C) 97.9 F (36.6 C)  TempSrc: Oral Oral Oral Oral  SpO2: 100% 100% 100% 100%  Weight:      Height:        General - Elderly Caucasian male, no apparent distress HEENT - PERRLA, EOMI, atraumatic head, non tender sinuses. Lung - Clear, bibasal rales, rhonchi, no wheezes. Heart - S1, S2 heard, no murmurs, rubs, 2+ pedal edema. Abdomen - Soft, non tender non distended, bowel sounds good Neuro - Alert, awake and oriented x 3, non focal exam. Skin - Warm and dry. Left leg wound dressing, swelling noted.  Data Reviewed:      Latest Ref Rng & Units 06/26/2023    4:37 AM 06/22/2023    5:05 AM 06/21/2023    5:51 AM  CBC  WBC 4.0 - 10.5 K/uL 5.7  4.5  4.9   Hemoglobin 13.0 - 17.0 g/dL 40.9  81.1  91.4   Hematocrit 39.0 - 52.0 % 36.8  36.3  38.7   Platelets 150 - 400 K/uL 318  327  329       Latest Ref Rng & Units 06/26/2023    4:37 AM 06/22/2023    5:05 AM 06/21/2023    5:51 AM  BMP  Glucose 70 - 99 mg/dL 782  956  213   BUN 8 - 23 mg/dL 20  22  19    Creatinine 0.61 - 1.24 mg/dL 0.86  5.78  4.69   Sodium 135 - 145 mmol/L 137  136  132   Potassium 3.5 - 5.1 mmol/L 4.3  4.1  3.9   Chloride 98 - 111 mmol/L 102  101  98   CO2 22 - 32 mmol/L 22  26  21    Calcium 8.9 - 10.3 mg/dL 8.9  8.8  8.6    No results found.   Family Communication: Discussed with patient, he understands and agrees. All questions answereed.  Disposition: Status is: Inpatient Remains inpatient appropriate because: lower extremity amputation on friday  Planned Discharge Destination: Home with Home Health     Time spent: 37 minutes  Author: Marcelino Duster, MD 06/27/2023 3:17 PM Secure chat 7am to 7pm For on call review [email or web address].

## 2023-06-28 ENCOUNTER — Inpatient Hospital Stay (HOSPITAL_COMMUNITY): Payer: Medicare HMO | Admitting: Anesthesiology

## 2023-06-28 ENCOUNTER — Encounter (HOSPITAL_COMMUNITY)
Admission: EM | Disposition: A | Discharge status: Skilled Nursing Facility | Payer: Self-pay | Source: Home / Self Care | Attending: Family Medicine

## 2023-06-28 ENCOUNTER — Other Ambulatory Visit: Payer: Self-pay

## 2023-06-28 ENCOUNTER — Encounter (HOSPITAL_COMMUNITY): Payer: Self-pay | Admitting: Internal Medicine

## 2023-06-28 DIAGNOSIS — E1169 Type 2 diabetes mellitus with other specified complication: Secondary | ICD-10-CM

## 2023-06-28 DIAGNOSIS — M86262 Subacute osteomyelitis, left tibia and fibula: Secondary | ICD-10-CM | POA: Diagnosis not present

## 2023-06-28 DIAGNOSIS — M869 Osteomyelitis, unspecified: Secondary | ICD-10-CM

## 2023-06-28 HISTORY — PX: AMPUTATION: SHX166

## 2023-06-28 LAB — CK: Total CK: 30 U/L — ABNORMAL LOW (ref 49–397)

## 2023-06-28 LAB — BASIC METABOLIC PANEL
Anion gap: 10 (ref 5–15)
BUN: 21 mg/dL (ref 8–23)
CO2: 25 mmol/L (ref 22–32)
Calcium: 9 mg/dL (ref 8.9–10.3)
Chloride: 105 mmol/L (ref 98–111)
Creatinine, Ser: 0.72 mg/dL (ref 0.61–1.24)
GFR, Estimated: >60 mL/min (ref >60)
Glucose, Bld: 231 mg/dL — ABNORMAL HIGH (ref 70–99)
Potassium: 4.1 mmol/L (ref 3.5–5.1)
Sodium: 140 mmol/L (ref 135–145)

## 2023-06-28 LAB — GLUCOSE, CAPILLARY
Glucose-Capillary: 206 mg/dL — ABNORMAL HIGH (ref 70–99)
Glucose-Capillary: 154 mg/dL — ABNORMAL HIGH (ref 70–99)
Glucose-Capillary: 131 mg/dL — ABNORMAL HIGH (ref 70–99)
Glucose-Capillary: 154 mg/dL — ABNORMAL HIGH (ref 70–99)
Glucose-Capillary: 153 mg/dL — ABNORMAL HIGH (ref 70–99)
Glucose-Capillary: 199 mg/dL — ABNORMAL HIGH (ref 70–99)

## 2023-06-28 LAB — CBC
HCT: 36.3 % — ABNORMAL LOW (ref 39.0–52.0)
Hemoglobin: 11.3 g/dL — ABNORMAL LOW (ref 13.0–17.0)
MCH: 29.4 pg (ref 26.0–34.0)
MCHC: 31.1 g/dL (ref 30.0–36.0)
MCV: 94.3 fL (ref 80.0–100.0)
Platelets: 311 10*3/uL (ref 150–400)
RBC: 3.85 MIL/uL — ABNORMAL LOW (ref 4.22–5.81)
RDW: 15.2 % (ref 11.5–15.5)
WBC: 5.4 10*3/uL (ref 4.0–10.5)
nRBC: 0 % (ref 0.0–0.2)

## 2023-06-28 LAB — HEMOGLOBIN A1C
Hgb A1c MFr Bld: 8.4 % — ABNORMAL HIGH (ref 4.8–5.6)
Mean Plasma Glucose: 194.38 mg/dL

## 2023-06-28 SURGERY — AMPUTATION, ABOVE KNEE
Anesthesia: General | Site: Knee | Laterality: Left

## 2023-06-28 MED ORDER — ONDANSETRON HCL 4 MG/2ML IJ SOLN: 4.0000 mg | Freq: Once | INTRAMUSCULAR | Status: DC | PRN

## 2023-06-28 MED ORDER — ZINC SULFATE 220 (50 ZN) MG PO CAPS
220.0000 mg | ORAL_CAPSULE | Freq: Every day | ORAL | Status: DC
  Administered 2023-06-28 – 2023-07-05 (×8): 220 mg via ORAL
  Filled 2023-06-28 (×8): qty 1

## 2023-06-28 MED ORDER — OXYCODONE HCL 5 MG PO TABS: 5.0000 mg | ORAL_TABLET | Freq: Once | ORAL | Status: DC | PRN

## 2023-06-28 MED ORDER — ONDANSETRON HCL 4 MG/2ML IJ SOLN: 4.0000 mg | Freq: Four times a day (QID) | INTRAMUSCULAR | Status: DC | PRN

## 2023-06-28 MED ORDER — LIDOCAINE 2% (20 MG/ML) 5 ML SYRINGE
INTRAMUSCULAR | Status: DC | PRN
  Administered 2023-06-28: 100 mg via INTRAVENOUS

## 2023-06-28 MED ORDER — ALUM & MAG HYDROXIDE-SIMETH 200-200-20 MG/5ML PO SUSP: 15.0000 mL | ORAL | Status: DC | PRN

## 2023-06-28 MED ORDER — MAGNESIUM SULFATE 2 GM/50ML IV SOLN: 2.0000 g | Freq: Every day | INTRAVENOUS | Status: DC | PRN

## 2023-06-28 MED ORDER — DOCUSATE SODIUM 100 MG PO CAPS
100.0000 mg | ORAL_CAPSULE | Freq: Every day | ORAL | Status: DC
  Administered 2023-06-29 – 2023-07-05 (×7): 100 mg via ORAL
  Filled 2023-06-28 (×6): qty 1

## 2023-06-28 MED ORDER — MEPERIDINE HCL 25 MG/ML IJ SOLN: 6.2500 mg | INTRAMUSCULAR | Status: DC | PRN

## 2023-06-28 MED ORDER — LABETALOL HCL 5 MG/ML IV SOLN: 10.0000 mg | INTRAVENOUS | Status: DC | PRN

## 2023-06-28 MED ORDER — POTASSIUM CHLORIDE CRYS ER 20 MEQ PO TBCR: 20.0000 meq | EXTENDED_RELEASE_TABLET | Freq: Every day | ORAL | Status: DC | PRN

## 2023-06-28 MED ORDER — GUAIFENESIN-DM 100-10 MG/5ML PO SYRP: 15.0000 mL | ORAL_SOLUTION | ORAL | Status: DC | PRN

## 2023-06-28 MED ORDER — VASOPRESSIN 20 UNIT/ML IV SOLN
INTRAVENOUS | Status: AC
  Filled 2023-06-28: qty 1

## 2023-06-28 MED ORDER — VANCOMYCIN HCL IN DEXTROSE 1-5 GM/200ML-% IV SOLN
INTRAVENOUS | Status: AC
  Filled 2023-06-28: qty 200

## 2023-06-28 MED ORDER — FENTANYL CITRATE (PF) 100 MCG/2ML IJ SOLN
INTRAMUSCULAR | Status: AC
  Filled 2023-06-28: qty 2

## 2023-06-28 MED ORDER — LACTATED RINGERS IV SOLN
INTRAVENOUS | Status: DC | PRN
Start: 2023-06-28 — End: 2023-06-28

## 2023-06-28 MED ORDER — BISACODYL 5 MG PO TBEC: 5.0000 mg | DELAYED_RELEASE_TABLET | Freq: Every day | ORAL | Status: DC | PRN

## 2023-06-28 MED ORDER — PROPOFOL 10 MG/ML IV BOLUS
INTRAVENOUS | Status: DC | PRN
  Administered 2023-06-28: 150 mg via INTRAVENOUS
  Administered 2023-06-28: 30 mg via INTRAVENOUS

## 2023-06-28 MED ORDER — HYDRALAZINE HCL 20 MG/ML IJ SOLN: 5.0000 mg | INTRAMUSCULAR | Status: DC | PRN

## 2023-06-28 MED ORDER — ONDANSETRON HCL 4 MG/2ML IJ SOLN
INTRAMUSCULAR | Status: DC | PRN
  Administered 2023-06-28: 4 mg via INTRAVENOUS

## 2023-06-28 MED ORDER — PHENYLEPHRINE HCL-NACL 20-0.9 MG/250ML-% IV SOLN
INTRAVENOUS | Status: DC | PRN
  Administered 2023-06-28: 70 ug/min via INTRAVENOUS

## 2023-06-28 MED ORDER — OXYCODONE HCL 5 MG PO TABS
5.0000 mg | ORAL_TABLET | ORAL | Status: DC | PRN
  Administered 2023-06-28: 5 mg via ORAL
  Administered 2023-06-30 – 2023-07-03 (×3): 10 mg via ORAL
  Filled 2023-06-28: qty 1
  Filled 2023-06-28 (×3): qty 2

## 2023-06-28 MED ORDER — FENTANYL CITRATE (PF) 100 MCG/2ML IJ SOLN
25.0000 ug | INTRAMUSCULAR | Status: DC | PRN
  Administered 2023-06-28 (×3): 50 ug via INTRAVENOUS

## 2023-06-28 MED ORDER — CEFAZOLIN SODIUM-DEXTROSE 2-4 GM/100ML-% IV SOLN
INTRAVENOUS | Status: AC
  Filled 2023-06-28: qty 100

## 2023-06-28 MED ORDER — POLYETHYLENE GLYCOL 3350 17 G PO PACK: 17.0000 g | PACK | Freq: Every day | ORAL | Status: DC | PRN

## 2023-06-28 MED ORDER — PANTOPRAZOLE SODIUM 40 MG PO TBEC
40.0000 mg | DELAYED_RELEASE_TABLET | Freq: Every day | ORAL | Status: DC
  Administered 2023-06-28 – 2023-07-05 (×8): 40 mg via ORAL
  Filled 2023-06-28 (×8): qty 1

## 2023-06-28 MED ORDER — TRANEXAMIC ACID-NACL 1000-0.7 MG/100ML-% IV SOLN
INTRAVENOUS | Status: AC
  Filled 2023-06-28: qty 100

## 2023-06-28 MED ORDER — HYDROMORPHONE HCL 1 MG/ML IJ SOLN
0.5000 mg | INTRAMUSCULAR | Status: DC | PRN
  Administered 2023-06-28 – 2023-06-29 (×2): 1 mg via INTRAVENOUS
  Filled 2023-06-28 (×2): qty 1

## 2023-06-28 MED ORDER — ACETAMINOPHEN 325 MG PO TABS: 325.0000 mg | ORAL_TABLET | Freq: Four times a day (QID) | ORAL | Status: DC | PRN

## 2023-06-28 MED ORDER — MAGNESIUM CITRATE PO SOLN: 1.0000 | Freq: Once | ORAL | Status: DC | PRN

## 2023-06-28 MED ORDER — ACETAMINOPHEN 325 MG PO TABS: 325.0000 mg | ORAL_TABLET | ORAL | Status: DC | PRN

## 2023-06-28 MED ORDER — FENTANYL CITRATE (PF) 250 MCG/5ML IJ SOLN
INTRAMUSCULAR | Status: DC | PRN
  Administered 2023-06-28 (×2): 50 ug via INTRAVENOUS

## 2023-06-28 MED ORDER — CHLORHEXIDINE GLUCONATE 0.12 % MT SOLN
OROMUCOSAL | Status: AC
  Administered 2023-06-28: 15 mL
  Filled 2023-06-28: qty 15

## 2023-06-28 MED ORDER — FENTANYL CITRATE (PF) 250 MCG/5ML IJ SOLN
INTRAMUSCULAR | Status: AC
  Filled 2023-06-28: qty 5

## 2023-06-28 MED ORDER — METOPROLOL TARTRATE 5 MG/5ML IV SOLN: 2.0000 mg | INTRAVENOUS | Status: DC | PRN

## 2023-06-28 MED ORDER — VITAMIN C 500 MG PO TABS
1000.0000 mg | ORAL_TABLET | Freq: Every day | ORAL | Status: DC
  Administered 2023-06-28 – 2023-07-05 (×8): 1000 mg via ORAL
  Filled 2023-06-28 (×9): qty 2

## 2023-06-28 MED ORDER — PHENYLEPHRINE HCL-NACL 20-0.9 MG/250ML-% IV SOLN
INTRAVENOUS | Status: AC
  Filled 2023-06-28: qty 250

## 2023-06-28 MED ORDER — OXYCODONE HCL 5 MG/5ML PO SOLN: 5.0000 mg | Freq: Once | ORAL | Status: DC | PRN

## 2023-06-28 MED ORDER — ACETAMINOPHEN 160 MG/5ML PO SOLN: 325.0000 mg | ORAL | Status: DC | PRN

## 2023-06-28 MED ORDER — ALBUMIN HUMAN 5 % IV SOLN
INTRAVENOUS | Status: DC | PRN
Start: 2023-06-28 — End: 2023-06-28

## 2023-06-28 MED ORDER — HYDROCERIN EX CREA
TOPICAL_CREAM | Freq: Two times a day (BID) | CUTANEOUS | Status: DC
  Administered 2023-07-05: 1 via TOPICAL
  Filled 2023-06-28 (×3): qty 113

## 2023-06-28 MED ORDER — PHENOL 1.4 % MT LIQD: 1.0000 | OROMUCOSAL | Status: DC | PRN

## 2023-06-28 MED ORDER — JUVEN PO PACK
1.0000 | PACK | Freq: Two times a day (BID) | ORAL | Status: DC
  Administered 2023-06-28 – 2023-07-05 (×14): 1 via ORAL
  Filled 2023-06-28 (×15): qty 1

## 2023-06-28 MED ORDER — OXYCODONE HCL 5 MG PO TABS
10.0000 mg | ORAL_TABLET | ORAL | Status: DC | PRN
  Administered 2023-06-28 – 2023-06-30 (×3): 15 mg via ORAL
  Filled 2023-06-28 (×3): qty 3

## 2023-06-28 SURGICAL SUPPLY — 44 items
BAG COUNTER SPONGE SURGICOUNT (BAG) IMPLANT
BAG SPNG CNTER NS LX DISP (BAG)
BLADE SAW RECIP 77.5X11 (BLADE) IMPLANT
BLADE SAW RECIP 87.9 MT (BLADE) ×1 IMPLANT
BLADE SURG 21 STRL SS (BLADE) ×1 IMPLANT
BNDG CMPR 5X6 CHSV STRCH STRL (GAUZE/BANDAGES/DRESSINGS) ×1
BNDG COHESIVE 6X5 TAN ST LF (GAUZE/BANDAGES/DRESSINGS) ×1 IMPLANT
CANISTER WOUND CARE 500ML ATS (WOUND CARE) IMPLANT
COVER SURGICAL LIGHT HANDLE (MISCELLANEOUS) ×1 IMPLANT
CUFF TOURN SGL QUICK 34 (TOURNIQUET CUFF)
CUFF TRNQT CYL 34X4.125X (TOURNIQUET CUFF) IMPLANT
DRAPE INCISE IOBAN 66X45 STRL (DRAPES) ×2 IMPLANT
DRAPE U-SHAPE 47X51 STRL (DRAPES) ×1 IMPLANT
DRESSING PREVENA PLUS CUSTOM (GAUZE/BANDAGES/DRESSINGS) ×1 IMPLANT
DRSG PREVENA PLUS CUSTOM (GAUZE/BANDAGES/DRESSINGS) ×1
DURAPREP 26ML APPLICATOR (WOUND CARE) ×1 IMPLANT
ELECT REM PT RETURN 9FT ADLT (ELECTROSURGICAL) ×1
ELECTRODE REM PT RTRN 9FT ADLT (ELECTROSURGICAL) ×1 IMPLANT
GLOVE BIOGEL PI IND STRL 7.5 (GLOVE) ×1 IMPLANT
GLOVE BIOGEL PI IND STRL 9 (GLOVE) ×1 IMPLANT
GLOVE SURG ORTHO 9.0 STRL STRW (GLOVE) ×1 IMPLANT
GLOVE SURG SS PI 6.5 STRL IVOR (GLOVE) ×1 IMPLANT
GOWN STRL REUS W/ TWL LRG LVL3 (GOWN DISPOSABLE) ×1 IMPLANT
GOWN STRL REUS W/ TWL XL LVL3 (GOWN DISPOSABLE) ×2 IMPLANT
GOWN STRL REUS W/TWL LRG LVL3 (GOWN DISPOSABLE) ×1
GOWN STRL REUS W/TWL XL LVL3 (GOWN DISPOSABLE) ×2
GRAFT SKIN WND SURGICLOSE M95 (Tissue) IMPLANT
KIT BASIN OR (CUSTOM PROCEDURE TRAY) ×1 IMPLANT
KIT TURNOVER KIT B (KITS) ×1 IMPLANT
MANIFOLD NEPTUNE II (INSTRUMENTS) ×1 IMPLANT
NS IRRIG 1000ML POUR BTL (IV SOLUTION) ×1 IMPLANT
PACK ORTHO EXTREMITY (CUSTOM PROCEDURE TRAY) ×1 IMPLANT
PAD ARMBOARD 7.5X6 YLW CONV (MISCELLANEOUS) ×1 IMPLANT
PREVENA RESTOR ARTHOFORM 46X30 (CANNISTER) ×1 IMPLANT
STAPLER VISISTAT 35W (STAPLE) IMPLANT
STOCKINETTE IMPERVIOUS LG (DRAPES) IMPLANT
SUT ETHILON 2 0 PSLX (SUTURE) ×2 IMPLANT
SUT SILK 2 0 (SUTURE) ×1
SUT SILK 2-0 18XBRD TIE 12 (SUTURE) ×1 IMPLANT
SUT VIC AB 1 CTX 36 (SUTURE) ×2
SUT VIC AB 1 CTX36XBRD ANBCTR (SUTURE) IMPLANT
TOWEL GREEN STERILE FF (TOWEL DISPOSABLE) ×1 IMPLANT
TUBE CONNECTING 20X1/4 (TUBING) ×1 IMPLANT
YANKAUER SUCT BULB TIP NO VENT (SUCTIONS) ×1 IMPLANT

## 2023-06-28 NOTE — Transfer of Care (Signed)
Immediate Anesthesia Transfer of Care Note  Patient: Vaun Hyndman.  Procedure(s) Performed: LEFT ABOVE KNEE AMPUTATION (Left: Knee)  Patient Location: PACU  Anesthesia Type:General  Level of Consciousness: awake  Airway & Oxygen Therapy: Patient Spontanous Breathing  Post-op Assessment: Report given to RN and Post -op Vital signs reviewed and stable  Post vital signs: Neo gtt restarted in PACU, Albumin infusing. VSS  Last Vitals:  Vitals Value Taken Time  BP 75/51 06/28/23 1200  Temp 36.3 C 06/28/23 1200  Pulse 93 06/28/23 1200  Resp 18 06/28/23 1200  SpO2 93 % 06/28/23 1200    Last Pain:  Vitals:   06/28/23 1200  TempSrc:   PainSc: 10-Worst pain ever      Patients Stated Pain Goal: 3 (06/28/23 1200)  Complications: No notable events documented.

## 2023-06-28 NOTE — Anesthesia Postprocedure Evaluation (Signed)
Anesthesia Post Note  Patient: Micheal Moore.  Procedure(s) Performed: LEFT ABOVE KNEE AMPUTATION (Left: Knee)     Patient location during evaluation: PACU Anesthesia Type: General Level of consciousness: awake and alert Pain management: pain level controlled Vital Signs Assessment: post-procedure vital signs reviewed and stable Respiratory status: spontaneous breathing, nonlabored ventilation, respiratory function stable and patient connected to nasal cannula oxygen Cardiovascular status: blood pressure returned to baseline and stable Postop Assessment: no apparent nausea or vomiting Anesthetic complications: no   No notable events documented.  Last Vitals:  Vitals:   06/28/23 1215 06/28/23 1230  BP: 116/70 99/63  Pulse: 89 97  Resp: 16 11  Temp:    SpO2: 95% 95%    Last Pain:  Vitals:   06/28/23 1230  TempSrc:   PainSc: Asleep                 [name]

## 2023-06-28 NOTE — Op Note (Signed)
06/19/2023 - 06/28/2023  11:42 AM  PATIENT:  Micheal Moore.    PRE-OPERATIVE DIAGNOSIS:  Osteomyelitis Left Leg  POST-OPERATIVE DIAGNOSIS:  Same  PROCEDURE:  LEFT ABOVE KNEE AMPUTATION Application keratosis graft 95 cm to cover wound surface area greater than 200 cm. Application of Prevena customizable wound VAC.   SURGEON:  Nadara Mustard, MD  PHYSICIAN ASSISTANT:None ANESTHESIA:   General  PREOPERATIVE INDICATIONS:  Massai Hankerson. is a  72 y.o. male with a diagnosis of Osteomyelitis Left Leg who failed conservative measures and elected for surgical management.    The risks benefits and alternatives were discussed with the patient preoperatively including but not limited to the risks of infection, bleeding, nerve injury, cardiopulmonary complications, the need for revision surgery, among others, and the patient was willing to proceed.  OPERATIVE IMPLANTS:   * No implants in log *  @ENCIMAGES @  OPERATIVE FINDINGS: Tissue margins had healthy viable tissue.  Patient had hyperextension of the knee that was unstable and patient has essentially no range of motion of the hip.  OPERATIVE PROCEDURE: Patient was brought the operating room underwent a general anesthetic.  After adequate levels anesthesia obtained patient's left lower extremity was prepped using DuraPrep draped into a sterile field the left lower extremity was draped out of the field with impervious stockinette.  A fishmouth incision was made just proximal to the patella.  This was carried down to the intermuscular septum medially and the vascular bundles were clamped and sutured with 2-0 silk.  A reciprocating saw was used to perform the amputation through the femur.  The fishmouth incision was continued posteriorly.  The leg was delivered off the field.  The vascular bundles were suture ligated with 2-0 silk.  Hemostasis was obtained.  The wound was filled with Kerecis micro graft 95 cm for wound surface  area greater than 200s square centimeters for tissue reinforcement.  The deep fascial layer was closed using #1 Vicryl the skin was closed using 2-0 nylon a Prevena customizable was applied patient was extubated taken the PACU in stable condition.   DISCHARGE PLANNING:  Antibiotic duration: Kefzol preoperatively  Weightbearing: Nonweightbearing on the left  Pain medication: Opioid pathway  Dressing care/ Wound VAC: Wound VAC  Ambulatory devices: Walker  Discharge to: Anticipate patient will need to be discharged to skilled nursing.  Follow-up: In the office 1 week post operative.

## 2023-06-28 NOTE — Anesthesia Procedure Notes (Signed)
Procedure Name: LMA Insertion Date/Time: 06/28/2023 11:08 AM  Performed by: Loleta Jarvis Knodel, CRNAPre-anesthesia Checklist: Patient identified, Patient being monitored, Timeout performed, Emergency Drugs available and Suction available Patient Re-evaluated:Patient Re-evaluated prior to induction Oxygen Delivery Method: Circle system utilized Preoxygenation: Pre-oxygenation with 100% oxygen Induction Type: IV induction Ventilation: Mask ventilation without difficulty LMA: LMA inserted LMA Size: 5.0 Tube type: Oral Number of attempts: 1 Placement Confirmation: positive ETCO2 and breath sounds checked- equal and bilateral Tube secured with: Tape Dental Injury: Teeth and Oropharynx as per pre-operative assessment

## 2023-06-28 NOTE — Interval H&P Note (Signed)
History and Physical Interval Note:  06/28/2023 6:43 AM  Micheal Moore.  has presented today for surgery, with the diagnosis of Osteomyelitis Left Leg.  The various methods of treatment have been discussed with the patient and family. After consideration of risks, benefits and other options for treatment, the patient has consented to  Procedure(s): LEFT ABOVE KNEE AMPUTATION (Left) as a surgical intervention.  The patient's history has been reviewed, patient examined, no change in status, stable for surgery.  I have reviewed the patient's chart and labs.  Questions were answered to the patient's satisfaction.     Nadara Mustard

## 2023-06-28 NOTE — Progress Notes (Signed)
Progress Note   Patient: Micheal Moore. ZOX:[id number] DOB: 05/29/1951 DOA: 06/19/2023     9 DOS: the patient was seen and examined on 06/28/2023   Brief hospital course: Micheal Moore. is a 72 y.o. male with medical history significant of CAD s/p PCI April 2024, PAD, DM-2, chronic hypotension, recent hospitalization from 8/7-8/17 for polymicrobial bacteremia-discharged home on IV antibiotics presented to the hospital on 10/2 with worsening pain/discharge from his wound in his left ankle area. Over the past several weeks-this has gradually worsened-but over the past few days-pain and discharge from the ulcer has markedly worsened. He states the pain is intractable-10 out of 10-and reports foul-smelling discharge that is yellow color.   Orthopedic surgery team evaluated him, advised Left AKA (planned for 10/11), ID advised antibiotics until 24hr post surgery.   Assessment and Plan: Left diabetic foot infection/Osteomyelitis  MRI with osteomyelitis of the fibular shaft and distal metaphysis with surrounding myositis - endosteal edema in distal tibia - abnormal edema and mild enhancement posteriorly in the calcaneus suspicious for early osteo (see report) ID consult -> deep tissue swab grew MRSA- dapto and merrem per ID, Continue broad spectrum antibiotics until 24hr post aka surgery. Ortho team planning left AKA 10/11.   History of PAD Recent lower extremity angiogram May 2024-no significant femoropopliteal occlusive disease noted On dual antiplatelet/statin.    CAD-s/p PCI to RCA April 2024 No anginal symptoms On aspirin/Brilinta/statin  Not on beta-blocker due to history of chronic hypotension. DAPT for 1 year unless plan for surgery (brilinta can be held prior to surgery if needed - 5 days prior and restarted ASAP post operatively.  Aspirin should be continued indefinitely and throughout perioperative period per cardiology. Ortho ok to continue Brillinta.   Diabetes  mellitus type-2 Continue Levemir 20 units/SSI/Jardiance Carb consistent diet. Continue accucheks, sliding scale.   History of orthostatic hypotension Continue Florinef   Chronic ambulatory dysfunction/nonambulatory/bedbound status He has complicated history of bilateral hip repairs-severe fear of falling and injuring himself-and apparently his chosen to be bedridden.   Per patient report-he has been bedbound since February 2024.  Nutrition Status: Nutrition Problem: Moderate Malnutrition Etiology: chronic illness Signs/Symptoms: moderate fat depletion, moderate muscle depletion Interventions: Ensure Enlive (each supplement provides 350kcal and 20 grams of protein), Other (Comment) (Liberalize diet)     Out of bed to chair. Incentive spirometry.     Code Status: Full Code  Subjective:  Denies SOB/CP  Physical Exam: Vitals:   06/27/23 1615 06/27/23 1939 06/28/23 0328 06/28/23 0736  BP: (!) 140/75 (!) 144/86 110/73 (!) 144/86  Pulse: (!) 102  99 100  Resp: 17 18 18    Temp: 98.3 F (36.8 C) 98 F (36.7 C) 98.4 F (36.9 C) 98.3 F (36.8 C)  TempSrc: Oral Oral  Oral  SpO2: 100% 98% 98% 100%  Weight:      Height:         General: Appearance:    Well developed, well nourished male in no acute distress   Dry,flaky skin  Lungs:     respirations unlabored  Heart:    Tachycardic     Neurologic:   Awake, alert     Data Reviewed:      Latest Ref Rng & Units 06/28/2023    7:43 AM 06/27/2023    4:09 PM 06/26/2023    4:37 AM  CBC  WBC 4.0 - 10.5 K/uL 5.4  5.5  5.7   Hemoglobin 13.0 - 17.0 g/dL 81.1  12.2  11.6   Hematocrit 39.0 - 52.0 % 36.3  38.3  36.8   Platelets 150 - 400 K/uL 311  336  318       Latest Ref Rng & Units 06/27/2023    4:09 PM 06/26/2023    4:37 AM 06/22/2023    5:05 AM  BMP  Glucose 70 - 99 mg/dL 161  096  045   BUN 8 - 23 mg/dL 20  20  22    Creatinine 0.61 - 1.24 mg/dL 4.09  8.11  9.14   Sodium 135 - 145 mmol/L 140  137  136   Potassium 3.5  - 5.1 mmol/L 4.2  4.3  4.1   Chloride 98 - 111 mmol/L 97  102  101   CO2 22 - 32 mmol/L 26  22  26    Calcium 8.9 - 10.3 mg/dL 9.3  8.9  8.8       Family Communication: Discussed with patient, he understands and agrees. All questions answereed.  Disposition: Status is: Inpatient Remains inpatient appropriate because: lower extremity amputation on friday  Planned Discharge Destination: Home with Home Health    Time spent: 35 minutes  Author: Joseph Art, DO 06/28/2023 8:54 AM Secure chat 7am to 7pm For on call review [email or web address].

## 2023-06-29 DIAGNOSIS — M8600 Acute hematogenous osteomyelitis, unspecified site: Secondary | ICD-10-CM | POA: Diagnosis not present

## 2023-06-29 LAB — CBC
HCT: 31.3 % — ABNORMAL LOW (ref 39.0–52.0)
Hemoglobin: 9.9 g/dL — ABNORMAL LOW (ref 13.0–17.0)
MCH: 29.6 pg (ref 26.0–34.0)
MCHC: 31.6 g/dL (ref 30.0–36.0)
MCV: 93.4 fL (ref 80.0–100.0)
Platelets: 233 10*3/uL (ref 150–400)
RBC: 3.35 MIL/uL — ABNORMAL LOW (ref 4.22–5.81)
RDW: 15.5 % (ref 11.5–15.5)
WBC: 8.2 10*3/uL (ref 4.0–10.5)
nRBC: 0.4 % — ABNORMAL HIGH (ref 0.0–0.2)

## 2023-06-29 LAB — BASIC METABOLIC PANEL
Anion gap: 8 (ref 5–15)
BUN: 19 mg/dL (ref 8–23)
CO2: 25 mmol/L (ref 22–32)
Calcium: 8.6 mg/dL — ABNORMAL LOW (ref 8.9–10.3)
Chloride: 101 mmol/L (ref 98–111)
Creatinine, Ser: 0.61 mg/dL (ref 0.61–1.24)
GFR, Estimated: >60 mL/min (ref >60)
Glucose, Bld: 193 mg/dL — ABNORMAL HIGH (ref 70–99)
Potassium: 4.7 mmol/L (ref 3.5–5.1)
Sodium: 134 mmol/L — ABNORMAL LOW (ref 135–145)

## 2023-06-29 LAB — GLUCOSE, CAPILLARY
Glucose-Capillary: 176 mg/dL — ABNORMAL HIGH (ref 70–99)
Glucose-Capillary: 164 mg/dL — ABNORMAL HIGH (ref 70–99)
Glucose-Capillary: 186 mg/dL — ABNORMAL HIGH (ref 70–99)
Glucose-Capillary: 127 mg/dL — ABNORMAL HIGH (ref 70–99)

## 2023-06-29 MED ORDER — HYDRALAZINE HCL 20 MG/ML IJ SOLN: 10.0000 mg | INTRAMUSCULAR | Status: DC | PRN

## 2023-06-29 MED ORDER — TRAZODONE HCL 50 MG PO TABS
50.0000 mg | ORAL_TABLET | Freq: Every evening | ORAL | Status: DC | PRN
  Administered 2023-07-04: 50 mg via ORAL
  Filled 2023-06-29: qty 1

## 2023-06-29 MED ORDER — IPRATROPIUM-ALBUTEROL 0.5-2.5 (3) MG/3ML IN SOLN: 3.0000 mL | RESPIRATORY_TRACT | Status: DC | PRN

## 2023-06-29 MED ORDER — DAPTOMYCIN-SODIUM CHLORIDE 700-0.9 MG/100ML-% IV SOLN
8.0000 mg/kg | Freq: Every day | INTRAVENOUS | Status: AC
  Administered 2023-06-29: 700 mg via INTRAVENOUS
  Filled 2023-06-29: qty 100

## 2023-06-29 MED ORDER — METOPROLOL TARTRATE 5 MG/5ML IV SOLN
2.0000 mg | INTRAVENOUS | Status: DC | PRN
  Administered 2023-06-29: 5 mg via INTRAVENOUS
  Filled 2023-06-29 (×2): qty 5

## 2023-06-29 NOTE — Progress Notes (Addendum)
  Patient's nurse reporting that patient heart rate is 121 and patient is hemodynamically stable..  Per chart review patient already has as needed IV metoprolol on board.  Changed parameter to give it if heart rate above 120 from 130. Patient underwent left AKA 10/11 postop day 1.  Pain is well-controlled.  Tereasa Coop, MD [facility] 06/29/2023, 9:01 PM

## 2023-06-29 NOTE — Progress Notes (Signed)
PROGRESS NOTE    Micheal Moore.  MVH:[id number] DOB: Nov 11, 1950 DOA: 06/19/2023 PCP: [name], MD   Brief Narrative:  72 year old with history of CAD status post PCI April 2024, PAD, DM 2, chronic hypotension, recent hospitalization in August due to polymicrobial bacteremia requiring IV antibiotics at home.  Patient presents back to the hospital due to worsening pain in the left ankle area.  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  Per ID discontinue antibiotics 24 hours postoperatively.  Nonweightbearing on the left, wound VAC in place, ambulate with walker.   History of PAD Continue aspirin and Brilinta.  On Crestor   CAD-s/p PCI to RCA April 2024 No chest pain. On aspirin/Brilinta/statin  Not on beta-blocker due to history of chronic hypotension. DAPT for 1 year unless plan for surgery (brilinta can be held prior to surgery if needed - 5 days prior and restarted ASAP post operatively.     Diabetes mellitus type-2 Sliding scale and Accu-Chek.  Continue Levemir and Jardiance.  Adjust as necessary   History of orthostatic hypotension Continue Florinef   Chronic ambulatory dysfunction/nonambulatory/bedbound status Poor ambulatory status, apparently has been bedbound since February 2024    Moderate malnutrition Supplements    DVT prophylaxis: SCD's Start: 06/28/23 1356 enoxaparin (LOVENOX) injection 40 mg Start: 06/19/23 2200 Code Status: Full code Family Communication:   Status is: Inpatient Remains inpatient appropriate because: Patient will need placement    Subjective: Doing ok Declined PT this morning due to pain but willing to work   Examination:  General exam: Appears calm and comfortable  Respiratory system: Clear to auscultation. Respiratory effort normal. Cardiovascular system: S1 & S2 heard, RRR. No JVD, murmurs, rubs, gallops or clicks. No pedal edema. Gastrointestinal system: Abdomen is nondistended, soft and nontender. No organomegaly or masses felt. Normal bowel sounds heard. Central nervous system: Alert and oriented. No focal neurological deficits. Extremities: Left AKA noted.  Skin: No rashes, lesions or ulcers Psychiatry: Judgement and insight appear normal. Mood & affect appropriate.      Diet Orders (From admission, onward)     Start     Ordered   06/28/23 1356  Diet Carb Modified Fluid consistency: Thin; Room service appropriate? Yes  Diet effective now       Question Answer Comment  Calorie Level Medium 1600-2000   Fluid consistency: Thin   Room service appropriate? Yes      06/28/23 1355            Objective: Vitals:   06/28/23 2259 06/28/23 2303 06/29/23 0451 06/29/23 0723  BP:   127/80 122/65  Pulse: (!) 115 (!) 111 (!) 101   Resp:   17 17  Temp:   (!) 97.4 F (36.3 C) 98.4 F (36.9 C)  TempSrc:   Axillary Oral  SpO2: 100% 100% 98% 98%  Weight:      Height:  Intake/Output Summary (Last 24 hours) at 06/29/2023 0735 Last data filed at 06/28/2023 1603 Gross per 24 hour  Intake 700 ml  Output 450 ml  Net 250 ml   Filed Weights   06/19/23 1247 06/21/23 0418 06/28/23 1002  Weight: 88.5 kg 88.5 kg 88.5 kg    Scheduled Meds:  vitamin C  1,000 mg Oral Daily   aspirin EC  81 mg Oral Daily   docusate  sodium  100 mg Oral Daily   empagliflozin  10 mg Oral Daily   enoxaparin (LOVENOX) injection  40 mg Subcutaneous Q24H   feeding supplement  237 mL Oral BID BM   fludrocortisone  0.2 mg Oral Daily   Gerhardt's butt cream   Topical BID   hydrocerin   Topical BID   insulin aspart  0-9 Units Subcutaneous TID WC   insulin aspart  5 Units Subcutaneous TID WC   insulin detemir  23 Units Subcutaneous QHS   nutrition supplement (JUVEN)  1 packet Oral BID BM   pantoprazole  40 mg Oral Daily   polyethylene glycol  17 g Oral Daily   rosuvastatin  5 mg Oral Once per day on Monday Thursday   senna  1 tablet Oral BID   tamsulosin  0.4 mg Oral QPC supper   ticagrelor  90 mg Oral BID   zinc sulfate  220 mg Oral Daily   Continuous Infusions:  DAPTOmycin 700 mg (06/28/23 1944)   magnesium sulfate bolus IVPB     meropenem (MERREM) IV 1 g (06/29/23 1610)    Nutritional status Signs/Symptoms: moderate fat depletion, moderate muscle depletion Interventions: Ensure Enlive (each supplement provides 350kcal and 20 grams of protein), Other (Comment) (Liberalize diet) Body mass index is 23.12 kg/m.  Data Reviewed:   CBC: Recent Labs  Lab 06/26/23 0437 06/27/23 1609 06/28/23 0743 06/29/23 0421  WBC 5.7 5.5 5.4 8.2  NEUTROABS 2.9 3.1  --   --   HGB 11.6* 12.2* 11.3* 9.9*  HCT 36.8* 38.3* 36.3* 31.3*  MCV 94.8 96.7 94.3 93.4  PLT 318 336 311 233   Basic Metabolic Panel: Recent Labs  Lab 06/26/23 0437 06/27/23 1609 06/28/23 0743 06/29/23 0421  NA 137 140 140 134*  K 4.3 4.2 4.1 4.7  CL 102 97* 105 101  CO2 22 26 25 25   GLUCOSE 193* 211* 231* 193*  BUN 20 20 21 19   CREATININE 0.66 0.70 0.72 0.61  CALCIUM 8.9 9.3 9.0 8.6*  MG 2.4  --   --   --   PHOS 3.6  --   --   --    GFR: Estimated Creatinine Clearance: 104.5 mL/min (by C-G formula based on SCr of 0.61 mg/dL). Liver Function Tests: Recent Labs  Lab 06/26/23 0437 06/27/23 1609  AST 28 28  ALT 33 30  ALKPHOS 51 52  BILITOT  0.4 0.5  PROT 6.1* 6.5  ALBUMIN 2.7* 3.0*   No results for input(s): "LIPASE", "AMYLASE" in the last 168 hours. No results for input(s): "AMMONIA" in the last 168 hours. Coagulation Profile: No results for input(s): "INR", "PROTIME" in the last 168 hours. Cardiac Enzymes: Recent Labs  Lab 06/28/23 0743  CKTOTAL 30*   BNP (last 3 results) No results for input(s): "PROBNP" in the last 8760 hours. HbA1C: Recent Labs    06/28/23 0743  HGBA1C 8.4*   CBG: Recent Labs  Lab 06/28/23 1211 06/28/23 1548 06/28/23 1937 06/28/23 2317 06/29/23 0722  GLUCAP 131* 154* 153* 199* 176*   Lipid  Profile: No results for input(s): "CHOL", "HDL", "LDLCALC", "TRIG", "CHOLHDL", "LDLDIRECT" in the last 72 hours. Thyroid Function Tests: No results for input(s): "TSH", "T4TOTAL", "FREET4", "T3FREE", "THYROIDAB" in the last 72 hours. Anemia Panel: No results for input(s): "VITAMINB12", "FOLATE", "FERRITIN", "TIBC", "IRON", "RETICCTPCT" in the last 72 hours. Sepsis Labs: No results for input(s): "PROCALCITON", "LATICACIDVEN" in the last 168 hours.  Recent Results (from the past 240 hour(s))  Blood culture (routine x 2)     Status: None   Collection Time: 06/19/23  1:12 PM   Specimen: BLOOD  Result Value Ref Range Status   Specimen Description BLOOD RIGHT ANTECUBITAL  Final   Special Requests   Final    BOTTLES DRAWN AEROBIC ONLY Blood Culture adequate volume   Culture   Final    NO GROWTH 5 DAYS Performed at [facility], [address], [place], [place] [address]    Report Status 06/24/2023 FINAL  Final  Blood culture (routine x 2)     Status: None   Collection Time: 06/19/23  1:53 PM   Specimen: BLOOD LEFT ARM  Result Value Ref Range Status   Specimen Description BLOOD LEFT ARM  Final   Special Requests   Final    BOTTLES DRAWN AEROBIC AND ANAEROBIC Blood Culture results may not be optimal due to an inadequate volume of blood received in culture bottles   Culture   Final    NO  GROWTH 5 DAYS Performed at [facility], [address], [place], [place] [address]    Report Status 06/24/2023 FINAL  Final  Aerobic Culture w Gram Stain (superficial specimen)     Status: None   Collection Time: 06/21/23 10:31 AM   Specimen: Wound  Result Value Ref Range Status   Specimen Description WOUND  Final   Special Requests LEFT LEG  Final   Gram Stain   Final    NO WBC SEEN RARE GRAM POSITIVE RODS RARE GRAM POSITIVE COCCI IN PAIRS Performed at [facility], [address] [place], [place] [address]    Culture   Final    FEW METHICILLIN RESISTANT STAPHYLOCOCCUS AUREUS RARE PSEUDOMONAS AERUGINOSA    Report Status 06/24/2023 FINAL  Final   Organism ID, Bacteria METHICILLIN RESISTANT STAPHYLOCOCCUS AUREUS  Final   Organism ID, Bacteria PSEUDOMONAS AERUGINOSA  Final      Susceptibility   Methicillin resistant staphylococcus aureus - MIC*    CIPROFLOXACIN >=8 RESISTANT Resistant     ERYTHROMYCIN >=8 RESISTANT Resistant     GENTAMICIN <=0.5 SENSITIVE Sensitive     OXACILLIN >=4 RESISTANT Resistant     TETRACYCLINE <=1 SENSITIVE Sensitive     VANCOMYCIN <=0.5 SENSITIVE Sensitive     TRIMETH/SULFA <=10 SENSITIVE Sensitive     CLINDAMYCIN RESISTANT Resistant     RIFAMPIN <=0.5 SENSITIVE Sensitive     Inducible Clindamycin POSITIVE Resistant     LINEZOLID 2 SENSITIVE Sensitive     * FEW METHICILLIN RESISTANT STAPHYLOCOCCUS AUREUS   Pseudomonas aeruginosa - MIC*    CEFTAZIDIME 16 INTERMEDIATE Intermediate     CIPROFLOXACIN 1 INTERMEDIATE Intermediate     GENTAMICIN <=1 SENSITIVE Sensitive     IMIPENEM 1 SENSITIVE Sensitive     * RARE PSEUDOMONAS AERUGINOSA  MRSA Next Gen by PCR, Nasal     Status: Abnormal   Collection Time: 06/27/23  4:16 PM   Specimen: Nasal Mucosa; Nasal Swab  Result Value Ref Range Status   MRSA by PCR Next Gen DETECTED (A)  NOT DETECTED Final    Comment: RESULT CALLED TO, READ BACK BY AND VERIFIED WITH: RN Lamount Cranker [phone] @ 2101  FH (NOTE) The GeneXpert MRSA Assay (FDA approved for NASAL specimens only), is one component of a comprehensive MRSA colonization surveillance program. It is not intended to diagnose MRSA infection nor to guide or monitor treatment for MRSA infections. Test performance is not FDA approved in patients less than 53 years old. Performed at [facility], [address], [place], [place] [address]          Radiology Studies: No results found.         LOS: 10 days   Time spent= 35 mins    Miguel Rota, MD [facility]  If 7PM-7AM, please contact night-coverage  06/29/2023, 7:35 AM

## 2023-06-29 NOTE — Progress Notes (Signed)
PT Cancellation Note  Patient Details Name: Micheal Moore. MRN: [id number] DOB: 08/19/1951   Cancelled Treatment:    Reason Eval/Treat Not Completed: Patient declined, no reason specified (pt refused stating pain 7/10 despite premedication and denied any participation this date. Pt continues to decline hoyer and plans to return home with 2 men lifting him)   Kalee Mcclenathan B Mykael Trott 06/29/2023, 10:04 AM Merryl Hacker, PT Acute Rehabilitation Services Office: [phone]

## 2023-06-29 NOTE — Plan of Care (Signed)
  Problem: Education: Goal: Individualized Educational Video(s) Outcome: Progressing   Problem: Activity: Goal: Ability to return to baseline activity level will improve Outcome: Progressing   Problem: Cardiovascular: Goal: Ability to achieve and maintain adequate cardiovascular perfusion will improve Outcome: Progressing   Problem: Education: Goal: Ability to describe self-care measures that may prevent or decrease complications (Diabetes Survival Skills Education) will improve Outcome: Progressing   Problem: Cardiac: Goal: Ability to maintain an adequate cardiac output will improve Outcome: Progressing   Problem: Fluid Volume: Goal: Ability to achieve a balanced intake and output will improve Outcome: Progressing   Problem: Urinary Elimination: Goal: Ability to achieve and maintain adequate renal perfusion and functioning will improve Outcome: Progressing   Problem: Activity: Goal: Ability to tolerate increased activity will improve Outcome: Progressing

## 2023-06-29 NOTE — Progress Notes (Signed)
Patient ID: Micheal Moore., male   DOB: 07/21/1951, 72 y.o.   MRN: [id number] Patient is postoperative day 1 above-the-knee amputation on the left.  There is 100 cc of clear serosanguineous drainage in the wound VAC canister.  Patient refused therapy today.  Discussed the importance of getting up with therapy with the patient.  Anticipate patient will need discharge to skilled nursing and will discontinue the wound VAC dressing at discharge.

## 2023-06-30 ENCOUNTER — Encounter (HOSPITAL_COMMUNITY): Payer: Self-pay | Admitting: Orthopedic Surgery

## 2023-06-30 DIAGNOSIS — M8608 Acute hematogenous osteomyelitis, other sites: Secondary | ICD-10-CM | POA: Diagnosis not present

## 2023-06-30 LAB — CBC
HCT: 29.3 % — ABNORMAL LOW (ref 39.0–52.0)
Hemoglobin: 9.3 g/dL — ABNORMAL LOW (ref 13.0–17.0)
MCH: 29.5 pg (ref 26.0–34.0)
MCHC: 31.7 g/dL (ref 30.0–36.0)
MCV: 93 fL (ref 80.0–100.0)
Platelets: 225 10*3/uL (ref 150–400)
RBC: 3.15 MIL/uL — ABNORMAL LOW (ref 4.22–5.81)
RDW: 15.7 % — ABNORMAL HIGH (ref 11.5–15.5)
WBC: 6.8 10*3/uL (ref 4.0–10.5)
nRBC: 0 % (ref 0.0–0.2)

## 2023-06-30 LAB — BASIC METABOLIC PANEL
Anion gap: 10 (ref 5–15)
BUN: 15 mg/dL (ref 8–23)
CO2: 25 mmol/L (ref 22–32)
Calcium: 8.5 mg/dL — ABNORMAL LOW (ref 8.9–10.3)
Chloride: 97 mmol/L — ABNORMAL LOW (ref 98–111)
Creatinine, Ser: 0.59 mg/dL — ABNORMAL LOW (ref 0.61–1.24)
GFR, Estimated: >60 mL/min (ref >60)
Glucose, Bld: 189 mg/dL — ABNORMAL HIGH (ref 70–99)
Potassium: 3.6 mmol/L (ref 3.5–5.1)
Sodium: 132 mmol/L — ABNORMAL LOW (ref 135–145)

## 2023-06-30 LAB — GLUCOSE, CAPILLARY
Glucose-Capillary: 217 mg/dL — ABNORMAL HIGH (ref 70–99)
Glucose-Capillary: 163 mg/dL — ABNORMAL HIGH (ref 70–99)
Glucose-Capillary: 203 mg/dL — ABNORMAL HIGH (ref 70–99)
Glucose-Capillary: 189 mg/dL — ABNORMAL HIGH (ref 70–99)

## 2023-06-30 LAB — MAGNESIUM: Magnesium: 2.2 mg/dL (ref 1.7–2.4)

## 2023-06-30 MED ORDER — SENNA 8.6 MG PO TABS: 1.0000 | ORAL_TABLET | Freq: Two times a day (BID) | ORAL | Status: DC | PRN

## 2023-06-30 MED ORDER — CHLORHEXIDINE GLUCONATE CLOTH 2 % EX PADS
6.0000 | MEDICATED_PAD | Freq: Every day | CUTANEOUS | Status: DC
  Administered 2023-06-26 – 2023-07-03 (×8): 6 via TOPICAL

## 2023-06-30 MED ORDER — DOCUSATE SODIUM 100 MG PO CAPS: 100.0000 mg | ORAL_CAPSULE | Freq: Every day | ORAL | Status: DC

## 2023-06-30 MED ORDER — POTASSIUM CHLORIDE 20 MEQ PO PACK
40.0000 meq | PACK | Freq: Once | ORAL | Status: AC
  Administered 2023-06-30: 40 meq via ORAL
  Filled 2023-06-30: qty 2

## 2023-06-30 MED ORDER — FLUDROCORTISONE ACETATE 0.1 MG PO TABS: 0.2000 mg | ORAL_TABLET | Freq: Every day | ORAL | Status: DC

## 2023-06-30 MED ORDER — BISACODYL 5 MG PO TBEC: 5.0000 mg | DELAYED_RELEASE_TABLET | Freq: Every day | ORAL | Status: DC | PRN

## 2023-06-30 MED ORDER — METOPROLOL TARTRATE 5 MG/5ML IV SOLN
5.0000 mg | INTRAVENOUS | Status: AC | PRN
  Administered 2023-06-30: 5 mg via INTRAVENOUS

## 2023-06-30 MED ORDER — TAMSULOSIN HCL 0.4 MG PO CAPS: 0.4000 mg | ORAL_CAPSULE | Freq: Every day | ORAL | Status: DC

## 2023-06-30 MED ORDER — METOPROLOL TARTRATE 5 MG/5ML IV SOLN
5.0000 mg | INTRAVENOUS | Status: DC | PRN
  Administered 2023-06-30 – 2023-07-01 (×2): 5 mg via INTRAVENOUS
  Filled 2023-06-30: qty 5

## 2023-06-30 MED ORDER — ZINC SULFATE 220 (50 ZN) MG PO CAPS: 220.0000 mg | ORAL_CAPSULE | Freq: Every day | ORAL | Status: AC

## 2023-06-30 MED ORDER — SODIUM CHLORIDE 0.9 % IV BOLUS
1000.0000 mL | Freq: Once | INTRAVENOUS | Status: AC
  Administered 2023-06-30: 1000 mL via INTRAVENOUS

## 2023-06-30 MED ORDER — ASCORBIC ACID 1000 MG PO TABS: 1000.0000 mg | ORAL_TABLET | Freq: Every day | ORAL | Status: DC

## 2023-06-30 MED ORDER — OXYCODONE HCL 5 MG PO TABS: 5.0000 mg | ORAL_TABLET | ORAL | 0 refills | Status: DC | PRN

## 2023-06-30 NOTE — Hospital Course (Addendum)
  Brief Narrative:  72 year old with history of CAD status post PCI April 2024, PAD, DM 2, chronic hypotension, recent hospitalization in August due to polymicrobial bacteremia requiring IV antibiotics at home.  Patient presents back to the hospital due to worsening pain in the left ankle area.  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  PT recommending SNF.     Assessment & Plan:  Principal Problem:   Osteomyelitis (HCC) Active Problems:   CAD (coronary artery disease)   Type 2 diabetes mellitus with hyperlipidemia (HCC)   PVD (peripheral vascular disease) (HCC)   Hyperlipidemia   Protein-calorie malnutrition, moderate (HCC)   Ambulatory dysfunction   MRSA infection   History of MDR Pseudomonas aeruginosa infection     Left diabetic foot infection/Osteomyelitis status post left AKA 10/11 Initial MRI suggestive of osteomyelitis/myositis.  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    Nonweightbearing on the left, wound VAC in place, ambulate with walker.  Antibiotics discontinued 10/12.   History of PAD Continue aspirin and Brilinta.  On Crestor   CAD-s/p PCI to RCA April 2024 No chest pain. On aspirin/Brilinta/statin  Not on beta-blocker due to history of chronic hypotension. DAPT for 1 year unless plan for surgery (brilinta can be held prior to surgery if needed - 5 days prior and restarted ASAP post operatively.     Diabetes mellitus type-2 Sliding scale and Accu-Chek.  Continue Levemir and Jardiance.  Adjust as necessary  Tachycardia - EKGsinus tach 1L IVF   History of orthostatic hypotension Continue Florinef   Chronic ambulatory dysfunction/nonambulatory/bedbound status Poor ambulatory status, apparently has been bedbound since February 2024   Moderate malnutrition Supplements     DVT prophylaxis: Lovenox Code Status: Full  code Family Communication:   Status is: Inpatient Remains inpatient appropriate because: Patient will need placement

## 2023-06-30 NOTE — Plan of Care (Signed)
Problem: Education: Goal: Understanding of CV disease, CV risk reduction, and recovery process will improve Outcome: Progressing Goal: Individualized Educational Video(s) Outcome: Progressing   Problem: Activity: Goal: Ability to return to baseline activity level will improve Outcome: Progressing   Problem: Cardiovascular: Goal: Ability to achieve and maintain adequate cardiovascular perfusion will improve Outcome: Progressing Goal: Vascular access site(s) Level 0-1 will be maintained Outcome: Progressing   Problem: Health Behavior/Discharge Planning: Goal: Ability to safely manage health-related needs after discharge will improve Outcome: Progressing   Problem: Education: Goal: Ability to describe self-care measures that may prevent or decrease complications (Diabetes Survival Skills Education) will improve Outcome: Progressing Goal: Individualized Educational Video(s) Outcome: Progressing   Problem: Cardiac: Goal: Ability to maintain an adequate cardiac output will improve Outcome: Progressing   Problem: Health Behavior/Discharge Planning: Goal: Ability to identify and utilize available resources and services will improve Outcome: Progressing Goal: Ability to manage health-related needs will improve Outcome: Progressing   Problem: Fluid Volume: Goal: Ability to achieve a balanced intake and output will improve Outcome: Progressing   Problem: Metabolic: Goal: Ability to maintain appropriate glucose levels will improve Outcome: Progressing   Problem: Nutritional: Goal: Maintenance of adequate nutrition will improve Outcome: Progressing Goal: Maintenance of adequate weight for body size and type will improve Outcome: Progressing   Problem: Respiratory: Goal: Will regain and/or maintain adequate ventilation Outcome: Progressing   Problem: Urinary Elimination: Goal: Ability to achieve and maintain adequate renal perfusion and functioning will improve Outcome:  Progressing   Problem: Education: Goal: Knowledge of the prescribed therapeutic regimen will improve Outcome: Progressing Goal: Understanding of discharge needs will improve Outcome: Progressing Goal: Individualized Educational Video(s) Outcome: Progressing   Problem: Activity: Goal: Ability to avoid complications of mobility impairment will improve Outcome: Progressing Goal: Ability to tolerate increased activity will improve Outcome: Progressing   Problem: Clinical Measurements: Goal: Postoperative complications will be avoided or minimized Outcome: Progressing   Problem: Pain Management: Goal: Pain level will decrease with appropriate interventions Outcome: Progressing   Problem: Skin Integrity: Goal: Will show signs of wound healing Outcome: Progressing   Problem: Education: Goal: Ability to describe self-care measures that may prevent or decrease complications (Diabetes Survival Skills Education) will improve Outcome: Progressing Goal: Individualized Educational Video(s) Outcome: Progressing   Problem: Coping: Goal: Ability to adjust to condition or change in health will improve Outcome: Progressing   Problem: Fluid Volume: Goal: Ability to maintain a balanced intake and output will improve Outcome: Progressing   Problem: Health Behavior/Discharge Planning: Goal: Ability to identify and utilize available resources and services will improve Outcome: Progressing Goal: Ability to manage health-related needs will improve Outcome: Progressing   Problem: Metabolic: Goal: Ability to maintain appropriate glucose levels will improve Outcome: Progressing   Problem: Nutritional: Goal: Maintenance of adequate nutrition will improve Outcome: Progressing Goal: Progress toward achieving an optimal weight will improve Outcome: Progressing   Problem: Skin Integrity: Goal: Risk for impaired skin integrity will decrease Outcome: Progressing   Problem: Tissue  Perfusion: Goal: Adequacy of tissue perfusion will improve Outcome: Progressing   Problem: Education: Goal: Knowledge of General Education information will improve Description: Including pain rating scale, medication(s)/side effects and non-pharmacologic comfort measures Outcome: Progressing   Problem: Health Behavior/Discharge Planning: Goal: Ability to manage health-related needs will improve Outcome: Progressing   Problem: Clinical Measurements: Goal: Ability to maintain clinical measurements within normal limits will improve Outcome: Progressing Goal: Will remain free from infection Outcome: Progressing Goal: Diagnostic test results will improve Outcome: Progressing Goal: Respiratory complications will  improve Outcome: Progressing Goal: Cardiovascular complication will be avoided Outcome: Progressing   Problem: Activity: Goal: Risk for activity intolerance will decrease Outcome: Progressing   Problem: Nutrition: Goal: Adequate nutrition will be maintained Outcome: Progressing

## 2023-06-30 NOTE — Plan of Care (Signed)
  Problem: Education: Goal: Understanding of CV disease, CV risk reduction, and recovery process will improve Outcome: Progressing   Problem: Cardiac: Goal: Ability to maintain an adequate cardiac output will improve Outcome: Progressing   Problem: Fluid Volume: Goal: Ability to achieve a balanced intake and output will improve Outcome: Progressing   Problem: Pain Management: Goal: Pain level will decrease with appropriate interventions Outcome: Progressing   Problem: Fluid Volume: Goal: Ability to maintain a balanced intake and output will improve Outcome: Progressing

## 2023-06-30 NOTE — Progress Notes (Addendum)
PROGRESS NOTE    Micheal Moore.  ZOX:[id number] DOB: [birth year] DOA: 06/19/2023 PCP: Micheal Prince, MD     Brief Narrative:  72 year old with history of CAD status post PCI April 2024, PAD, DM 2, chronic hypotension, recent hospitalization in August due to polymicrobial bacteremia requiring IV antibiotics at home.  Patient presents back to the hospital due to worsening pain in the left ankle area.  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  PT recommending SNF.     Assessment & Plan:  Principal Problem:   Osteomyelitis (HCC) Active Problems:   CAD (coronary artery disease)   Type 2 diabetes mellitus with hyperlipidemia (HCC)   PVD (peripheral vascular disease) (HCC)   Hyperlipidemia   Protein-calorie malnutrition, moderate (HCC)   Ambulatory dysfunction   MRSA infection   History of MDR Pseudomonas aeruginosa infection     Left diabetic foot infection/Osteomyelitis status post left AKA 10/11 Initial MRI suggestive of osteomyelitis/myositis.  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    Nonweightbearing on the left, wound VAC in place, ambulate with walker.  Antibiotics discontinued 10/12.   History of PAD Continue aspirin and Brilinta.  On Crestor   CAD-s/p PCI to RCA April 2024 No chest pain. On aspirin/Brilinta/statin  Not on beta-blocker due to history of chronic hypotension. DAPT for 1 year unless plan for surgery (brilinta can be held prior to surgery if needed - 5 days prior and restarted ASAP post operatively.     Diabetes mellitus type-2 Sliding scale and Accu-Chek.  Continue Levemir and Jardiance.  Adjust as necessary  Tachycardia - EKGsinus tach 1L IVF   History of orthostatic hypotension Continue Florinef   Chronic ambulatory dysfunction/nonambulatory/bedbound status Poor ambulatory status, apparently has  been bedbound since February 2024   Moderate malnutrition Supplements     DVT prophylaxis: Lovenox Code Status: Full code Family Communication:   Status is: Inpatient Remains inpatient appropriate because: Patient will need placement             Subjective: Seen at bedside some tachycardia this morning.  EKG showing sinus tachycardia.  Patient is any complaints.  Eating his breakfast   Examination:  General exam: Appears calm and comfortable  Respiratory system: Clear to auscultation. Respiratory effort normal. Cardiovascular system: Sinus tachycardia Gastrointestinal system: Abdomen is nondistended, soft and nontender. No organomegaly or masses felt. Normal bowel sounds heard. Central nervous system: Alert and oriented. No focal neurological deficits. Extremities: Symmetric 5 x 5 power. Skin: Left lower extremity amputation noted.  Wound VAC in place Psychiatry: Judgement and insight appear normal. Mood & affect appropriate.       Diet Orders (From admission, onward)     Start     Ordered   06/28/23 1356  Diet Carb Modified Fluid consistency: Thin; Room service appropriate? Yes  Diet effective now       Question Answer Comment  Calorie Level Medium 1600-2000   Fluid consistency: Thin   Room service appropriate? Yes      06/28/23 1355            Objective: Vitals:   06/29/23 2119 06/29/23 2135 06/30/23 0419 06/30/23 0736  BP: 137/61 110/62 124/63 (!) 122/90  Pulse: (!) 126 (!) 103 (!) 110 (!) 118  Resp:   17 18  Temp:   98.8 F (37.1 C) 98 F (36.7 C)  TempSrc:  Oral   SpO2: 98% 97% 97% 97%  Weight:      Height:        Intake/Output Summary (Last 24 hours) at 06/30/2023 0950 Last data filed at 06/30/2023 0500 Gross per 24 hour  Intake 200 ml  Output 2200 ml  Net -2000 ml   Filed Weights   06/19/23 1247 06/21/23 0418 06/28/23 1002  Weight: 88.5 kg 88.5 kg 88.5 kg    Scheduled Meds:  vitamin C  1,000 mg Oral Daily   aspirin EC  81  mg Oral Daily   docusate sodium  100 mg Oral Daily   empagliflozin  10 mg Oral Daily   enoxaparin (LOVENOX) injection  40 mg Subcutaneous Q24H   feeding supplement  237 mL Oral BID BM   fludrocortisone  0.2 mg Oral Daily   Gerhardt's butt cream   Topical BID   hydrocerin   Topical BID   insulin aspart  0-9 Units Subcutaneous TID WC   insulin aspart  5 Units Subcutaneous TID WC   insulin detemir  23 Units Subcutaneous QHS   nutrition supplement (JUVEN)  1 packet Oral BID BM   pantoprazole  40 mg Oral Daily   polyethylene glycol  17 g Oral Daily   rosuvastatin  5 mg Oral Once per day on Monday Thursday   senna  1 tablet Oral BID   tamsulosin  0.4 mg Oral QPC supper   ticagrelor  90 mg Oral BID   zinc sulfate  220 mg Oral Daily   Continuous Infusions:  magnesium sulfate bolus IVPB      Nutritional status Signs/Symptoms: moderate fat depletion, moderate muscle depletion Interventions: Ensure Enlive (each supplement provides 350kcal and 20 grams of protein), Other (Comment) (Liberalize diet) Body mass index is 23.12 kg/m.  Data Reviewed:   CBC: Recent Labs  Lab 06/26/23 0437 06/27/23 1609 06/28/23 0743 06/29/23 0421 06/30/23 0530  WBC 5.7 5.5 5.4 8.2 6.8  NEUTROABS 2.9 3.1  --   --   --   HGB 11.6* 12.2* 11.3* 9.9* 9.3*  HCT 36.8* 38.3* 36.3* 31.3* 29.3*  MCV 94.8 96.7 94.3 93.4 93.0  PLT 318 336 311 233 225   Basic Metabolic Panel: Recent Labs  Lab 06/26/23 0437 06/27/23 1609 06/28/23 0743 06/29/23 0421 06/30/23 0530  NA 137 140 140 134* 132*  K 4.3 4.2 4.1 4.7 3.6  CL 102 97* 105 101 97*  CO2 22 26 25 25 25   GLUCOSE 193* 211* 231* 193* 189*  BUN 20 20 21 19 15   CREATININE 0.66 0.70 0.72 0.61 0.59*  CALCIUM 8.9 9.3 9.0 8.6* 8.5*  MG 2.4  --   --   --  2.2  PHOS 3.6  --   --   --   --    GFR: Estimated Creatinine Clearance: 104.5 mL/min (A) (by C-G formula based on SCr of 0.59 mg/dL (L)). Liver Function Tests: Recent Labs  Lab 06/26/23 0437  06/27/23 1609  AST 28 28  ALT 33 30  ALKPHOS 51 52  BILITOT 0.4 0.5  PROT 6.1* 6.5  ALBUMIN 2.7* 3.0*   No results for input(s): "LIPASE", "AMYLASE" in the last 168 hours. No results for input(s): "AMMONIA" in the last 168 hours. Coagulation Profile: No results for input(s): "INR", "PROTIME" in the last 168 hours. Cardiac Enzymes: Recent Labs  Lab 06/28/23 0743  CKTOTAL 30*   BNP (last 3 results) No results for input(s): "PROBNP" in the last 8760 hours. HbA1C: Recent Labs  06/28/23 0743  HGBA1C 8.4*   CBG: Recent Labs  Lab 06/29/23 0722 06/29/23 1132 06/29/23 1813 06/29/23 2013 06/30/23 0734  GLUCAP 176* 164* 186* 127* 217*   Lipid Profile: No results for input(s): "CHOL", "HDL", "LDLCALC", "TRIG", "CHOLHDL", "LDLDIRECT" in the last 72 hours. Thyroid Function Tests: No results for input(s): "TSH", "T4TOTAL", "FREET4", "T3FREE", "THYROIDAB" in the last 72 hours. Anemia Panel: No results for input(s): "VITAMINB12", "FOLATE", "FERRITIN", "TIBC", "IRON", "RETICCTPCT" in the last 72 hours. Sepsis Labs: No results for input(s): "PROCALCITON", "LATICACIDVEN" in the last 168 hours.  Recent Results (from the past 240 hour(s))  Aerobic Culture w Gram Stain (superficial specimen)     Status: None   Collection Time: 06/21/23 10:31 AM   Specimen: Wound  Result Value Ref Range Status   Specimen Description WOUND  Final   Special Requests LEFT LEG  Final   Gram Stain   Final    NO WBC SEEN RARE GRAM POSITIVE RODS RARE GRAM POSITIVE COCCI IN PAIRS Performed at [facility], [address] [place], [place] [address]    Culture   Final    FEW METHICILLIN RESISTANT STAPHYLOCOCCUS AUREUS RARE PSEUDOMONAS AERUGINOSA    Report Status 06/24/2023 FINAL  Final   Organism ID, Bacteria METHICILLIN RESISTANT STAPHYLOCOCCUS AUREUS  Final   Organism ID, Bacteria PSEUDOMONAS AERUGINOSA  Final      Susceptibility   Methicillin resistant staphylococcus aureus - MIC*     CIPROFLOXACIN >=8 RESISTANT Resistant     ERYTHROMYCIN >=8 RESISTANT Resistant     GENTAMICIN <=0.5 SENSITIVE Sensitive     OXACILLIN >=4 RESISTANT Resistant     TETRACYCLINE <=1 SENSITIVE Sensitive     VANCOMYCIN <=0.5 SENSITIVE Sensitive     TRIMETH/SULFA <=10 SENSITIVE Sensitive     CLINDAMYCIN RESISTANT Resistant     RIFAMPIN <=0.5 SENSITIVE Sensitive     Inducible Clindamycin POSITIVE Resistant     LINEZOLID 2 SENSITIVE Sensitive     * FEW METHICILLIN RESISTANT STAPHYLOCOCCUS AUREUS   Pseudomonas aeruginosa - MIC*    CEFTAZIDIME 16 INTERMEDIATE Intermediate     CIPROFLOXACIN 1 INTERMEDIATE Intermediate     GENTAMICIN <=1 SENSITIVE Sensitive     IMIPENEM 1 SENSITIVE Sensitive     * RARE PSEUDOMONAS AERUGINOSA  MRSA Next Gen by PCR, Nasal     Status: Abnormal   Collection Time: 06/27/23  4:16 PM   Specimen: Nasal Mucosa; Nasal Swab  Result Value Ref Range Status   MRSA by PCR Next Gen DETECTED (A) NOT DETECTED Final    Comment: RESULT CALLED TO, READ BACK BY AND VERIFIED WITH: RN Lamount Cranker [phone] @ 2101 FH (NOTE) The GeneXpert MRSA Assay (FDA approved for NASAL specimens only), is one component of a comprehensive MRSA colonization surveillance program. It is not intended to diagnose MRSA infection nor to guide or monitor treatment for MRSA infections. Test performance is not FDA approved in patients less than 77 years old. Performed at [facility], [address], [place], [place] [address]          Radiology Studies: No results found.         LOS: 11 days   Time spent= 35 mins    Miguel Rota, MD [facility]  If 7PM-7AM, please contact night-coverage  06/30/2023, 9:50 AM

## 2023-06-30 NOTE — Evaluation (Signed)
Physical Therapy Re-Evaluation Patient Details Name: Micheal Moore. MRN: [id number] DOB: [birth year] Today's Date: 06/30/2023  History of Present Illness  72 y.o. male admitted 10/2 with increased pain of left ankle wound with osteomyelitis. 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 PMhx: admissions:7/24 - 8/5 and 8/7-8/17 for bacteremia. CAD s/p PCI, syncope, orthostatic hypotension, T2DM, DVT, PAD, right elbow melanoma s/p excision, L THA x2 (due to complications)  Clinical Impression   Continuing work on functional mobility and activity tolerance;  with work on functional mobility and activity tolerance, after LLE AKA; session focused on functional transfer training, and pt was able to sit EOB with mod assist, and tolerated work on upright posture in sitting, and work on weight shifts and scooting to initiate functional transfers;  Pt was engaged in PT session, and asking when he can work again as our session Ended; Patient will benefit from continued inpatient follow up therapy, <3 hours/day, to maximize independence and safety with mobility and ADLs, with the goal of getting back home safely      If plan is discharge home, recommend the following: Two people to help with walking and/or transfers;A lot of help with bathing/dressing/bathroom;Assistance with cooking/housework;Assist for transportation;Help with stairs or ramp for entrance   Can travel by private vehicle   No    Equipment Recommendations Hoyer lift;Wheelchair (measurements PT);Wheelchair cushion (measurements PT) (amputee wheelchair)  Recommendations for Other Services       Functional Status Assessment Patient has had a recent decline in their functional status and demonstrates the ability to make significant improvements in function in a reasonable and predictable amount of time.     Precautions / Restrictions Precautions Precautions: Fall Precaution Comments: multiple pressure injuries, back, heels Restrictions LLE Weight Bearing:  Non weight bearing      Mobility  Bed Mobility Overal bed mobility: Needs Assistance Bed Mobility: Rolling, Sidelying to Sit, Sit to Supine Rolling: Supervision, Used rails   Supine to sit: Mod assist, +2 for physical assistance, +2 for safety/equipment Sit to supine: Max assist   General bed mobility comments: Heavy use of bed rails and bed controls with HHA to eleavte trunk to sitting, able to roll R/L without physical assist,    Transfers Overall transfer level: Needs assistance Equipment used: None Transfers: Bed to chair/wheelchair/BSC (Simulated)            Lateral/Scoot Transfers: +2 physical assistance, Mod assist General transfer comment: Initiated transfer training sitting EOB, and making small  shifts of hips forward, backward, R and L, using the bad pad; noting very good use of Bil UEs to unweigh hips; Still with persistent tendnecy to sit with pelvis in a posterior tilt, making scooting a challenge    Ambulation/Gait                  Stairs            Wheelchair Mobility     Tilt Bed    Modified Rankin (Stroke Patients Only)       Balance                                             Pertinent Vitals/Pain Pain Assessment Pain Assessment: Faces Faces Pain Scale: Hurts little more Pain Location: LLE, and some phantom limb pain Pain Descriptors / Indicators: Discomfort, Sore (LLE phanotm limb pain) Pain Intervention(s): Monitored during session, Premedicated before session  Home Living Family/patient expects to be discharged to:: Private residence Living Arrangements: Spouse/significant other Available Help at Discharge: Family;Available 24 hours/day Type of Home: House Home Access: Stairs to enter Entrance Stairs-Rails: None Entrance Stairs-Number of Steps: 2 to the poarch and one from porch into home   Home Layout: One level Home Equipment: Agricultural consultant (2 wheels);Wheelchair - manual;Hospital bed       Prior Function Prior Level of Function : Needs assist             Mobility Comments: Bedbound since 2023, 2 men pick him up and put him in the [facility]. He only gets OOB for appointments ADLs Comments: assists at bed level, wife completes most of his care     Extremity/Trunk Assessment   Upper Extremity Assessment Upper Extremity Assessment: Generalized weakness    Lower Extremity Assessment Lower Extremity Assessment: RLE deficits/detail;LLE deficits/detail RLE Deficits / Details: knee and hip flexion limited to 90 degrees grossly with 2/5 strength, able to achieve neutral ankle but cannot dorsiflex, 2+/5 hip Add/Abduct with total ROM grossly 20 degrees LLE Deficits / Details: Now s/p L AKA; pt reports pantom limb sensation; taught pt desensitiaztion, pt engaged adn asking questions, and pt moved his bed into a better position where he could reach his L residucal limb at end of session    Cervical / Trunk Assessment Cervical / Trunk Exceptions: skin integrity impiarments at back, bottom and thighs. limited spinal and hip mobility  Communication   Communication Communication: No apparent difficulties  Cognition Arousal: Alert Behavior During Therapy: WFL for tasks assessed/performed Overall Cognitive Status: No family/caregiver present to determine baseline cognitive functioning                                 General Comments: likely baseline, very particular about his bed pads. needs encouragement to participate.        General Comments General comments (skin integrity, edema, etc.): Pt had made a small BM, and assisted him with cleaning up; also applied skin protectant cream upper and middle back    Exercises     Assessment/Plan    PT Assessment Patient needs continued PT services  PT Problem List Decreased strength;Decreased range of motion;Decreased activity tolerance;Decreased mobility;Decreased balance;Decreased knowledge of use of DME;Pain       PT  Treatment Interventions Therapeutic activities;Therapeutic exercise;Patient/family education    PT Goals (Current goals can be found in the Care Plan section)  Acute Rehab PT Goals Patient Stated Goal: Be able to get home PT Goal Formulation: With patient Time For Goal Achievement: 07/14/23 (Goals set 06/22/2023 continue to be appropriate) Potential to Achieve Goals: Fair    Frequency Min 1X/week     Co-evaluation               AM-PAC PT "6 Clicks" Mobility  Outcome Measure Help needed turning from your back to your side while in a flat bed without using bedrails?: A Little Help needed moving from lying on your back to sitting on the side of a flat bed without using bedrails?: A Lot Help needed moving to and from a bed to a chair (including a wheelchair)?: Total Help needed standing up from a chair using your arms (e.g., wheelchair or bedside chair)?: Total Help needed to walk in hospital room?: Total Help needed climbing 3-5 steps with a railing? : Total 6 Click Score: 9    End of Session Equipment Utilized During  Treatment: Other (comment) (bed pads) Activity Tolerance: Patient tolerated treatment well Patient left: in bed;with call bell/phone within reach Nurse Communication: Mobility status PT Visit Diagnosis: Repeated falls (R29.6);Muscle weakness (generalized) (M62.81);Other abnormalities of gait and mobility (R26.89)    Time: 1914-7829 PT Time Calculation (min) (ACUTE ONLY): 27 min   Charges:     PT Treatments $Therapeutic Activity: 23-37 mins PT General Charges $$ ACUTE PT VISIT: 1 Visit         Van Clines, PT  Acute Rehabilitation Services Office [phone] Secure Chat welcomed   Levi Aland 06/30/2023, 2:41 PM

## 2023-07-01 DIAGNOSIS — M8600 Acute hematogenous osteomyelitis, unspecified site: Secondary | ICD-10-CM | POA: Diagnosis not present

## 2023-07-01 LAB — CBC
HCT: 30.2 % — ABNORMAL LOW (ref 39.0–52.0)
Hemoglobin: 9.7 g/dL — ABNORMAL LOW (ref 13.0–17.0)
MCH: 30.3 pg (ref 26.0–34.0)
MCHC: 32.1 g/dL (ref 30.0–36.0)
MCV: 94.4 fL (ref 80.0–100.0)
Platelets: 236 10*3/uL (ref 150–400)
RBC: 3.2 MIL/uL — ABNORMAL LOW (ref 4.22–5.81)
RDW: 15.5 % (ref 11.5–15.5)
WBC: 6.5 10*3/uL (ref 4.0–10.5)
nRBC: 0 % (ref 0.0–0.2)

## 2023-07-01 LAB — GLUCOSE, CAPILLARY
Glucose-Capillary: 128 mg/dL — ABNORMAL HIGH (ref 70–99)
Glucose-Capillary: 207 mg/dL — ABNORMAL HIGH (ref 70–99)
Glucose-Capillary: 238 mg/dL — ABNORMAL HIGH (ref 70–99)
Glucose-Capillary: 183 mg/dL — ABNORMAL HIGH (ref 70–99)
Glucose-Capillary: 235 mg/dL — ABNORMAL HIGH (ref 70–99)

## 2023-07-01 NOTE — Plan of Care (Signed)
  Problem: Nutritional: Goal: Maintenance of adequate nutrition will improve Outcome: Progressing   Problem: Pain Management: Goal: Pain level will decrease with appropriate interventions Outcome: Progressing   Problem: Nutritional: Goal: Maintenance of adequate nutrition will improve Outcome: Progressing

## 2023-07-01 NOTE — Progress Notes (Signed)
PROGRESS NOTE    Micheal Moore.  WUJ:[id number] DOB: 1951/08/28 DOA: 06/19/2023 PCP: [name], MD     Brief Narrative:  72 year old with history of CAD status post PCI April 2024, PAD, DM 2, chronic hypotension, recent hospitalization in August due to polymicrobial bacteremia requiring IV antibiotics at home.  Patient presents back to the hospital due to worsening pain in the left ankle area.  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  PT recommending SNF.     Assessment & Plan:  Principal Problem:   Osteomyelitis (HCC) Active Problems:   CAD (coronary artery disease)   Type 2 diabetes mellitus with hyperlipidemia (HCC)   PVD (peripheral vascular disease) (HCC)   Hyperlipidemia   Protein-calorie malnutrition, moderate (HCC)   Ambulatory dysfunction   MRSA infection   History of MDR Pseudomonas aeruginosa infection     Left diabetic foot infection/Osteomyelitis status post left AKA 10/11 Initial MRI suggestive of osteomyelitis/myositis.  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    Nonweightbearing on the left, wound VAC in place, ambulate with walker.  Antibiotics discontinued 10/12.   History of PAD Continue aspirin and Brilinta.  On Crestor   CAD-s/p PCI to RCA April 2024 No chest pain. On aspirin/Brilinta/statin  Not on beta-blocker due to history of chronic hypotension. DAPT for 1 year unless plan for surgery (brilinta can be held prior to surgery if needed - 5 days prior and restarted ASAP post operatively.     Diabetes mellitus type-2 Sliding scale and Accu-Chek.  Continue Levemir and Jardiance.  Adjust as necessary  Tachycardia - EKGsinus tach 1L IVF   History of orthostatic hypotension Continue Florinef   Chronic ambulatory dysfunction/nonambulatory/bedbound status Poor ambulatory status, apparently has  been bedbound since February 2024   Moderate malnutrition Supplements     DVT prophylaxis: Lovenox Code Status: Full code Family Communication:   Status is: Inpatient SNF placement.             Subjective: Doing ok no new complaints  Eating ok.    Examination:  General exam: Appears calm and comfortable  Respiratory system: Clear to auscultation. Respiratory effort normal. Cardiovascular system: S1 & S2 heard, RRR. No JVD, murmurs, rubs, gallops or clicks. No pedal edema. Gastrointestinal system: Abdomen is nondistended, soft and nontender. No organomegaly or masses felt. Normal bowel sounds heard. Central nervous system: Alert and oriented. No focal neurological deficits. Extremities: Symmetric 5 x 5 power. Skin: left AKA Psychiatry: Judgement and insight appear normal. Mood & affect appropriate.       Diet Orders (From admission, onward)     Start     Ordered   06/28/23 1356  Diet Carb Modified Fluid consistency: Thin; Room service appropriate? Yes  Diet effective now       Question Answer Comment  Calorie Level Medium 1600-2000   Fluid consistency: Thin   Room service appropriate? Yes      06/28/23 1355            Objective: Vitals:   06/30/23 1547 06/30/23 1946 07/01/23 0435 07/01/23 0746  BP: (!) 148/84 99/64 138/83 (!) 129/92  Pulse: (!) 125 (!) 103 (!) 104 (!) 104  Resp: 18 18 18 18   Temp: 98 F (36.7 C) 98.3 F (36.8 C) 97.7 F (36.5 C)   TempSrc:  Oral Oral   SpO2: 96% 100% 98% 98%  Weight:      Height:        Intake/Output Summary (Last 24 hours) at 07/01/2023 1110 Last data filed at 07/01/2023 0746 Gross per 24 hour  Intake --  Output 3200 ml  Net -3200 ml   Filed Weights   06/19/23 1247 06/21/23 0418 06/28/23 1002  Weight: 88.5 kg 88.5 kg 88.5 kg    Scheduled Meds:  vitamin C  1,000 mg Oral Daily   aspirin EC  81 mg Oral Daily   Chlorhexidine Gluconate Cloth  6 each Topical Daily   docusate sodium  100 mg Oral Daily    empagliflozin  10 mg Oral Daily   enoxaparin (LOVENOX) injection  40 mg Subcutaneous Q24H   feeding supplement  237 mL Oral BID BM   fludrocortisone  0.2 mg Oral Daily   Gerhardt's butt cream   Topical BID   hydrocerin   Topical BID   insulin aspart  0-9 Units Subcutaneous TID WC   insulin aspart  5 Units Subcutaneous TID WC   insulin detemir  23 Units Subcutaneous QHS   nutrition supplement (JUVEN)  1 packet Oral BID BM   pantoprazole  40 mg Oral Daily   polyethylene glycol  17 g Oral Daily   rosuvastatin  5 mg Oral Once per day on Monday Thursday   senna  1 tablet Oral BID   tamsulosin  0.4 mg Oral QPC supper   ticagrelor  90 mg Oral BID   zinc sulfate  220 mg Oral Daily   Continuous Infusions:  magnesium sulfate bolus IVPB      Nutritional status Signs/Symptoms: moderate fat depletion, moderate muscle depletion Interventions: Ensure Enlive (each supplement provides 350kcal and 20 grams of protein), Other (Comment) (Liberalize diet) Body mass index is 23.12 kg/m.  Data Reviewed:   CBC: Recent Labs  Lab 06/26/23 0437 06/27/23 1609 06/28/23 0743 06/29/23 0421 06/30/23 0530 07/01/23 0635  WBC 5.7 5.5 5.4 8.2 6.8 6.5  NEUTROABS 2.9 3.1  --   --   --   --   HGB 11.6* 12.2* 11.3* 9.9* 9.3* 9.7*  HCT 36.8* 38.3* 36.3* 31.3* 29.3* 30.2*  MCV 94.8 96.7 94.3 93.4 93.0 94.4  PLT 318 336 311 233 225 236   Basic Metabolic Panel: Recent Labs  Lab 06/26/23 0437 06/27/23 1609 06/28/23 0743 06/29/23 0421 06/30/23 0530  NA 137 140 140 134* 132*  K 4.3 4.2 4.1 4.7 3.6  CL 102 97* 105 101 97*  CO2 22 26 25 25 25   GLUCOSE 193* 211* 231* 193* 189*  BUN 20 20 21 19 15   CREATININE 0.66 0.70 0.72 0.61 0.59*  CALCIUM 8.9 9.3 9.0 8.6* 8.5*  MG 2.4  --   --   --  2.2  PHOS 3.6  --   --   --   --    GFR: Estimated Creatinine Clearance: 104.5 mL/min (A) (by C-G formula based on SCr of 0.59 mg/dL (L)). Liver Function Tests: Recent Labs  Lab 06/26/23 0437 06/27/23 1609   AST 28 28  ALT 33 30  ALKPHOS 51 52  BILITOT 0.4 0.5  PROT 6.1* 6.5  ALBUMIN 2.7* 3.0*   No results for input(s): "LIPASE", "AMYLASE" in the last 168 hours. No results for input(s): "AMMONIA" in the last 168 hours. Coagulation Profile: No results for input(s): "INR", "PROTIME" in the last 168 hours. Cardiac Enzymes: Recent Labs  Lab 06/28/23 0743  CKTOTAL 30*   BNP (last 3 results) No results for input(s): "PROBNP"  in the last 8760 hours. HbA1C: No results for input(s): "HGBA1C" in the last 72 hours. CBG: Recent Labs  Lab 06/30/23 1224 06/30/23 1548 06/30/23 1949 07/01/23 0446 07/01/23 0900  GLUCAP 163* 203* 189* 128* 207*   Lipid Profile: No results for input(s): "CHOL", "HDL", "LDLCALC", "TRIG", "CHOLHDL", "LDLDIRECT" in the last 72 hours. Thyroid Function Tests: No results for input(s): "TSH", "T4TOTAL", "FREET4", "T3FREE", "THYROIDAB" in the last 72 hours. Anemia Panel: No results for input(s): "VITAMINB12", "FOLATE", "FERRITIN", "TIBC", "IRON", "RETICCTPCT" in the last 72 hours. Sepsis Labs: No results for input(s): "PROCALCITON", "LATICACIDVEN" in the last 168 hours.  Recent Results (from the past 240 hour(s))  MRSA Next Gen by PCR, Nasal     Status: Abnormal   Collection Time: 06/27/23  4:16 PM   Specimen: Nasal Mucosa; Nasal Swab  Result Value Ref Range Status   MRSA by PCR Next Gen DETECTED (A) NOT DETECTED Final    Comment: RESULT CALLED TO, READ BACK BY AND VERIFIED WITH: RN Lamount Cranker [phone] @ 2101 FH (NOTE) The GeneXpert MRSA Assay (FDA approved for NASAL specimens only), is one component of a comprehensive MRSA colonization surveillance program. It is not intended to diagnose MRSA infection nor to guide or monitor treatment for MRSA infections. Test performance is not FDA approved in patients less than 66 years old. Performed at [facility], [address], [place], [place] [address]          Radiology Studies: No results  found.         LOS: 12 days   Time spent= 35 mins    Miguel Rota, MD [facility]  If 7PM-7AM, please contact night-coverage  07/01/2023, 11:10 AM

## 2023-07-01 NOTE — Plan of Care (Signed)
Problem: Coping: Goal: Ability to adjust to condition or change in health will improve Outcome: Progressing   Problem: Fluid Volume: Goal: Ability to maintain a balanced intake and output will improve Outcome: Progressing   Problem: Activity: Goal: Risk for activity intolerance will decrease Outcome: Progressing

## 2023-07-01 NOTE — TOC Progression Note (Addendum)
Transition of Care (TOC) - Progression Note    Patient Details  Name: Micheal Moore. MRN: [id number] Date of Birth: 1951/04/08  Transition of Care [facility]) CM/SW Contact  Canon Gola A [place], [place] Phone Number: 07/01/2023, 11:37 AM  Clinical Narrative:     Update 1600 CSW unable to start authorization request via [facility]. Facility notified, they will start insurance authorization. Status pending.   CSW spoke with pt at bedside and pt's wife, Micheal Moore, via phone. They said they wanted [facility] for SNF. SNF workup completed. Bed offers pending. Authorization to be started once pt is medically stable.   TOC will continue to follow.        Expected Discharge Plan and Services                                               Social Determinants of Health (SDOH) Interventions SDOH Screenings   Food Insecurity: No Food Insecurity (04/24/2023)  Housing: Low Risk  (04/24/2023)  Transportation Needs: No Transportation Needs (04/24/2023)  Utilities: Not At Risk (04/24/2023)  Tobacco Use: High Risk (06/28/2023)    Readmission Risk Interventions    06/24/2023    3:31 PM 04/25/2023    1:04 PM 04/12/2023    4:47 PM  Readmission Risk Prevention Plan  Transportation Screening Complete Complete Complete  PCP or Specialist Appt within 3-5 Days  Complete Complete  HRI or Home Care Consult  Complete Complete  Social Work Consult for Recovery Care Planning/Counseling  Complete Complete  Palliative Care Screening  Complete Not Applicable  Medication Review Oceanographer) Complete Complete Referral to Pharmacy  PCP or Specialist appointment within 3-5 days of discharge Complete    HRI or Home Care Consult Complete    SW Recovery Care/Counseling Consult Complete    Palliative Care Screening Complete    Skilled Nursing Facility Complete

## 2023-07-01 NOTE — NC FL2 (Signed)
[place] MEDICAID FL2 LEVEL OF CARE FORM     IDENTIFICATION  Patient Name: Micheal Moore. Birthdate: 1950-12-30 Sex: male Admission Date (Current Location): 06/19/2023  [facility] [place] Number:  Producer, television/film/video and Address:  [facility], [address], [place], [place] [address]      Provider Number: [id number]  Attending Physician Name and Address:  Miguel Rota, MD  Relative Name and Phone Number:  Brandtly Doiron, spouse - [phone]    Current Level of Care: Hospital Recommended Level of Care: Skilled Nursing Facility Prior Approval Number:    Date Approved/Denied:   PASRR Number: [id number]  Discharge Plan: SNF    Current Diagnoses: Patient Active Problem List   Diagnosis Date Noted   MRSA infection 06/24/2023   History of MDR Pseudomonas aeruginosa infection 06/24/2023   Osteomyelitis (HCC) 06/19/2023   Ambulatory dysfunction 05/01/2023   Weakness 04/24/2023   Medication management 04/16/2023   Bacteremia 04/12/2023   Aspiration pneumonia (HCC) 04/11/2023   Pressure injury of skin 04/11/2023   Nausea and vomiting 04/11/2023   Elevated troponin 04/11/2023   CAD (coronary artery disease) 04/11/2023   Hypokalemia 04/11/2023   PVD (peripheral vascular disease) (HCC) 04/11/2023   Debility 04/11/2023   Hyperglycemia 01/16/2023   STEMI (ST elevation myocardial infarction) (HCC) 01/15/2023   Diabetic ketoacidosis without coma associated with type 2 diabetes mellitus (HCC) 01/15/2023   Anterior dislocation of left hip (HCC) 08/24/2022   Closed dislocation of left hip (HCC) 08/23/2022   Normocytic anemia 08/23/2022   Protein-calorie malnutrition, moderate (HCC) 07/20/2022   Fall    Orthostatic hypotension    Hip fracture (HCC) 02/22/2022   Hyperlipidemia 02/22/2022   Chronic diastolic CHF (congestive heart failure) (HCC) 02/22/2022   Left great toe amputee (HCC) 05/06/2020   Cellulitis in diabetic foot (HCC)  04/27/2020   Osteomyelitis of great toe of left foot (HCC) 04/27/2020   Type 2 diabetes mellitus with hyperlipidemia (HCC) 04/27/2020   History of DVT (deep vein thrombosis) 04/27/2020   Tobacco abuse 04/27/2020    Orientation RESPIRATION BLADDER Height & Weight     Self, Time, Situation, Place  Normal Continent Weight: 88.5 kg Height:  6\' 5"  (195.6 cm)  BEHAVIORAL SYMPTOMS/MOOD NEUROLOGICAL BOWEL NUTRITION STATUS      Continent Diet (See Discharge Summary)  AMBULATORY STATUS COMMUNICATION OF NEEDS Skin   Total Care Verbally Surgical wounds, Other (Comment) (new Left AKA on 06/28/23 - wound vac intact but will be discontinued upon discharge to SNF, Irritant dermatitis bilateral buttocks)                       Personal Care Assistance Level of Assistance  Bathing, Feeding, Dressing Bathing Assistance: Maximum assistance Feeding assistance: Limited assistance Dressing Assistance: Maximum assistance     Functional Limitations Info  Sight, Hearing, Speech   Hearing Info: Impaired Speech Info: Adequate    SPECIAL CARE FACTORS FREQUENCY  PT (By licensed PT), OT (By licensed OT)     PT Frequency: 3-5 x per week OT Frequency: 3-5 x per week            Contractures Contractures Info: Not present    Additional Factors Info  Code Status, Isolation Precautions, Allergies Code Status Info: Full code Allergies Info: NKDA Psychotropic Info: Trazodone Insulin Sliding Scale Info: Novolog INsulin SQ 3x per day SQ - sensitive scale, Le[place]mire Sq Q HS Isolation Precautions Info: contact - MRSA     Current  Medications (07/01/2023):  This is the current hospital active medication list Current Facility-Administered Medications  Medication Dose Route Frequency Provider Last Rate Last Admin   acetaminophen (TYLENOL) tablet 650 mg  650 mg Oral Q6H PRN Nadara Mustard, MD   650 mg at 06/26/23 1813   Or   acetaminophen (TYLENOL) suppository 650 mg  650 mg Rectal Q6H PRN Nadara Mustard, MD       alum & mag hydroxide-simeth (MAALOX/MYLANTA) 200-200-20 MG/5ML suspension 15-30 mL  15-30 mL Oral Q2H PRN Nadara Mustard, MD       ascorbic acid (VITAMIN C) tablet 1,000 mg  1,000 mg Oral Daily Nadara Mustard, MD   1,000 mg at 07/01/23 4098   aspirin EC tablet 81 mg  81 mg Oral Daily Nadara Mustard, MD   81 mg at 07/01/23 0930   bisacodyl (DULCOLAX) EC tablet 5 mg  5 mg Oral Daily PRN Nadara Mustard, MD       bisacodyl (DULCOLAX) suppository 10 mg  10 mg Rectal Daily PRN Nadara Mustard, MD       Chlorhexidine Gluconate Cloth 2 % PADS 6 each  6 each Topical Daily Amin, Ankit C, MD   6 each at 07/01/23 0931   docusate sodium (COLACE) capsule 100 mg  100 mg Oral Daily Nadara Mustard, MD   100 mg at 07/01/23 1191   empagliflozin (JARDIANCE) tablet 10 mg  10 mg Oral Daily Nadara Mustard, MD   10 mg at 07/01/23 0932   enoxaparin (LOVENOX) injection 40 mg  40 mg Subcutaneous Q24H Nadara Mustard, MD   40 mg at 06/30/23 2320   feeding supplement (ENSURE ENLIVE / ENSURE PLUS) liquid 237 mL  237 mL Oral BID BM Nadara Mustard, MD   237 mL at 07/01/23 0931   fentaNYL (SUBLIMAZE) injection 25 mcg  25 mcg Intravenous Q2H PRN Nadara Mustard, MD   25 mcg at 06/29/23 4782   fludrocortisone (FLORINEF) tablet 0.2 mg  0.2 mg Oral Daily Nadara Mustard, MD   0.2 mg at 07/01/23 9562   Gerhardt's butt cream   Topical BID Nadara Mustard, MD   Given at 07/01/23 0931   guaiFENesin-dextromethorphan (ROBITUSSIN DM) 100-10 MG/5ML syrup 15 mL  15 mL Oral Q4H PRN Nadara Mustard, MD       hydrALAZINE (APRESOLINE) injection 10 mg  10 mg Intravenous Q4H PRN Amin, Ankit C, MD       hydrocerin (EUCERIN) cream   Topical BID Nadara Mustard, MD   Given at 07/01/23 [phone]   HYDROmorphone (DILAUDID) injection 0.5-1 mg  0.5-1 mg Intravenous Q4H PRN Nadara Mustard, MD   1 mg at 06/29/23 1652   hydrOXYzine (ATARAX) tablet 10 mg  10 mg Oral TID PRN Nadara Mustard, MD   10 mg at 07/01/23 0934   insulin aspart (novoLOG) injection 0-9  Units  0-9 Units Subcutaneous TID WC Nadara Mustard, MD   3 Units at 07/01/23 0926   insulin aspart (novoLOG) injection 5 Units  5 Units Subcutaneous TID WC Nadara Mustard, MD   5 Units at 07/01/23 0927   insulin detemir (LEVEMIR) injection 23 Units  23 Units Subcutaneous QHS Nadara Mustard, MD   23 Units at 06/30/23 2137   ipratropium-albuterol (DUONEB) 0.5-2.5 (3) MG/3ML nebulizer solution 3 mL  3 mL Nebulization Q4H PRN Amin, Ankit C, MD       magnesium citrate solution 1 Bottle  1 Bottle Oral Once PRN Nadara Mustard, MD       magnesium sulfate IVPB 2 g 50 mL  2 g Intravenous Daily PRN Nadara Mustard, MD       metoprolol tartrate (LOPRESSOR) injection 5 mg  5 mg Intravenous Q2H PRN Amin, Ankit C, MD   5 mg at 06/30/23 1803   nutrition supplement (JUVEN) (JUVEN) powder packet 1 packet  1 packet Oral BID BM Nadara Mustard, MD   1 packet at 07/01/23 0931   ondansetron (ZOFRAN) tablet 4 mg  4 mg Oral Q6H PRN Nadara Mustard, MD       Or   ondansetron [facility]) injection 4 mg  4 mg Intravenous Q6H PRN Nadara Mustard, MD       oxyCODONE (Oxy IR/ROXICODONE) immediate release tablet 10-15 mg  10-15 mg Oral Q4H PRN Nadara Mustard, MD   15 mg at 06/30/23 1420   oxyCODONE (Oxy IR/ROXICODONE) immediate release tablet 5-10 mg  5-10 mg Oral Q4H PRN Nadara Mustard, MD   10 mg at 06/30/23 0850   pantoprazole (PROTONIX) EC tablet 40 mg  40 mg Oral Daily Nadara Mustard, MD   40 mg at 07/01/23 0930   phenol (CHLORASEPTIC) mouth spray 1 spray  1 spray Mouth/Throat PRN Nadara Mustard, MD       polyethylene glycol (MIRALAX / GLYCOLAX) packet 17 g  17 g Oral Daily Nadara Mustard, MD   17 g at 07/01/23 2536   rosuvastatin (CRESTOR) tablet 5 mg  5 mg Oral Once per day on Monday Thursday Nadara Mustard, MD   5 mg at 07/01/23 6440   senna (SENOKOT) tablet 8.6 mg  1 tablet Oral BID Nadara Mustard, MD   8.6 mg at 07/01/23 0929   tamsulosin (FLOMAX) capsule 0.4 mg  0.4 mg Oral QPC supper Nadara Mustard, MD   0.4 mg at 06/30/23  1803   ticagrelor (BRILINTA) tablet 90 mg  90 mg Oral BID Nadara Mustard, MD   90 mg at 07/01/23 0930   traZODone (DESYREL) tablet 50 mg  50 mg Oral QHS PRN Amin, Ankit C, MD       zinc sulfate capsule 220 mg  220 mg Oral Daily Nadara Mustard, MD   220 mg at 07/01/23 0930     Discharge Medications: Please see discharge summary for a list of discharge medications.  Relevant Imaging Results:  Relevant Lab Results:   Additional Information SS# [id number]  Janae Bridgeman, RN

## 2023-07-02 DIAGNOSIS — M8600 Acute hematogenous osteomyelitis, unspecified site: Secondary | ICD-10-CM | POA: Diagnosis not present

## 2023-07-02 LAB — CBC
HCT: 29.1 % — ABNORMAL LOW (ref 39.0–52.0)
Hemoglobin: 9.2 g/dL — ABNORMAL LOW (ref 13.0–17.0)
MCH: 29.4 pg (ref 26.0–34.0)
MCHC: 31.6 g/dL (ref 30.0–36.0)
MCV: 93 fL (ref 80.0–100.0)
Platelets: 262 10*3/uL (ref 150–400)
RBC: 3.13 MIL/uL — ABNORMAL LOW (ref 4.22–5.81)
RDW: 15.5 % (ref 11.5–15.5)
WBC: 6.4 10*3/uL (ref 4.0–10.5)
nRBC: 0 % (ref 0.0–0.2)

## 2023-07-02 LAB — GLUCOSE, CAPILLARY
Glucose-Capillary: 198 mg/dL — ABNORMAL HIGH (ref 70–99)
Glucose-Capillary: 204 mg/dL — ABNORMAL HIGH (ref 70–99)
Glucose-Capillary: 236 mg/dL — ABNORMAL HIGH (ref 70–99)
Glucose-Capillary: 205 mg/dL — ABNORMAL HIGH (ref 70–99)

## 2023-07-02 LAB — SURGICAL PATHOLOGY

## 2023-07-02 NOTE — TOC Progression Note (Signed)
Transition of Care (TOC) - Progression Note    Patient Details  Name: Micheal Moore. MRN: [id number] Date of Birth: [birth year]  Transition of Care [facility]) CM/SW Contact  Alcee Sipos A [place], [place] Phone Number: 07/02/2023, 1:14 PM  Clinical Narrative:     Update 10/17 0934  Authorization remains pending for SNF to [facility].   Update 10/16 1401  Authorization remains pending for SNF to [facility].   Pt's authorization to [facility] is started by facility. Status pending. Discharge pending insurance authorization finalization.   TOC will continue to follow.        Expected Discharge Plan and Services                                               Social Determinants of Health (SDOH) Interventions SDOH Screenings   Food Insecurity: No Food Insecurity (04/24/2023)  Housing: Low Risk  (04/24/2023)  Transportation Needs: No Transportation Needs (04/24/2023)  Utilities: Not At Risk (04/24/2023)  Tobacco Use: High Risk (06/28/2023)    Readmission Risk Interventions    06/24/2023    3:31 PM 04/25/2023    1:04 PM 04/12/2023    4:47 PM  Readmission Risk Prevention Plan  Transportation Screening Complete Complete Complete  PCP or Specialist Appt within 3-5 Days  Complete Complete  HRI or Home Care Consult  Complete Complete  Social Work Consult for Recovery Care Planning/Counseling  Complete Complete  Palliative Care Screening  Complete Not Applicable  Medication Review Oceanographer) Complete Complete Referral to Pharmacy  PCP or Specialist appointment within 3-5 days of discharge Complete    HRI or Home Care Consult Complete    SW Recovery Care/Counseling Consult Complete    Palliative Care Screening Complete    Skilled Nursing Facility Complete

## 2023-07-02 NOTE — Plan of Care (Signed)
Problem: Education: Goal: Understanding of CV disease, CV risk reduction, and recovery process will improve Outcome: Progressing Goal: Individualized Educational Video(s) Outcome: Progressing   Problem: Activity: Goal: Ability to return to baseline activity level will improve Outcome: Progressing   Problem: Cardiovascular: Goal: Ability to achieve and maintain adequate cardiovascular perfusion will improve Outcome: Progressing Goal: Vascular access site(s) Level 0-1 will be maintained Outcome: Progressing   Problem: Health Behavior/Discharge Planning: Goal: Ability to safely manage health-related needs after discharge will improve Outcome: Progressing   Problem: Education: Goal: Ability to describe self-care measures that may prevent or decrease complications (Diabetes Survival Skills Education) will improve Outcome: Progressing Goal: Individualized Educational Video(s) Outcome: Progressing   Problem: Cardiac: Goal: Ability to maintain an adequate cardiac output will improve Outcome: Progressing   Problem: Health Behavior/Discharge Planning: Goal: Ability to identify and utilize available resources and services will improve Outcome: Progressing Goal: Ability to manage health-related needs will improve Outcome: Progressing   Problem: Fluid Volume: Goal: Ability to achieve a balanced intake and output will improve Outcome: Progressing   Problem: Metabolic: Goal: Ability to maintain appropriate glucose levels will improve Outcome: Progressing   Problem: Nutritional: Goal: Maintenance of adequate nutrition will improve Outcome: Progressing Goal: Maintenance of adequate weight for body size and type will improve Outcome: Progressing   Problem: Respiratory: Goal: Will regain and/or maintain adequate ventilation Outcome: Progressing   Problem: Urinary Elimination: Goal: Ability to achieve and maintain adequate renal perfusion and functioning will improve Outcome:  Progressing   Problem: Education: Goal: Knowledge of the prescribed therapeutic regimen will improve Outcome: Progressing Goal: Understanding of discharge needs will improve Outcome: Progressing Goal: Individualized Educational Video(s) Outcome: Progressing   Problem: Activity: Goal: Ability to avoid complications of mobility impairment will improve Outcome: Progressing Goal: Ability to tolerate increased activity will improve Outcome: Progressing   Problem: Clinical Measurements: Goal: Postoperative complications will be avoided or minimized Outcome: Progressing   Problem: Pain Management: Goal: Pain level will decrease with appropriate interventions Outcome: Progressing   Problem: Skin Integrity: Goal: Will show signs of wound healing Outcome: Progressing   Problem: Education: Goal: Ability to describe self-care measures that may prevent or decrease complications (Diabetes Survival Skills Education) will improve Outcome: Progressing Goal: Individualized Educational Video(s) Outcome: Progressing   Problem: Coping: Goal: Ability to adjust to condition or change in health will improve Outcome: Progressing   Problem: Fluid Volume: Goal: Ability to maintain a balanced intake and output will improve Outcome: Progressing   Problem: Health Behavior/Discharge Planning: Goal: Ability to identify and utilize available resources and services will improve Outcome: Progressing Goal: Ability to manage health-related needs will improve Outcome: Progressing   Problem: Metabolic: Goal: Ability to maintain appropriate glucose levels will improve Outcome: Progressing   Problem: Nutritional: Goal: Maintenance of adequate nutrition will improve Outcome: Progressing Goal: Progress toward achieving an optimal weight will improve Outcome: Progressing   Problem: Skin Integrity: Goal: Risk for impaired skin integrity will decrease Outcome: Progressing   Problem: Tissue  Perfusion: Goal: Adequacy of tissue perfusion will improve Outcome: Progressing   Problem: Education: Goal: Knowledge of General Education information will improve Description: Including pain rating scale, medication(s)/side effects and non-pharmacologic comfort measures Outcome: Progressing   Problem: Health Behavior/Discharge Planning: Goal: Ability to manage health-related needs will improve Outcome: Progressing   Problem: Clinical Measurements: Goal: Ability to maintain clinical measurements within normal limits will improve Outcome: Progressing Goal: Will remain free from infection Outcome: Progressing Goal: Diagnostic test results will improve Outcome: Progressing Goal: Respiratory complications will  improve Outcome: Progressing Goal: Cardiovascular complication will be avoided Outcome: Progressing   Problem: Activity: Goal: Risk for activity intolerance will decrease Outcome: Progressing   Problem: Nutrition: Goal: Adequate nutrition will be maintained Outcome: Progressing

## 2023-07-02 NOTE — Progress Notes (Signed)
Physical Therapy Treatment Patient Details Name: Micheal Moore. MRN: [id number] DOB: 1951-02-24 Today's Date: 07/02/2023   History of Present Illness 72 y.o. male admitted 10/2 with increased pain of left ankle wound with osteomyelitis. 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 PMhx: admissions:7/24 - 8/5 and 8/7-8/17 for bacteremia. CAD s/p PCI, syncope, orthostatic hypotension, T2DM, DVT, PAD, right elbow melanoma s/p excision, L THA x2 (due to complications)    PT Comments  Pt greeted resting in bed and agreeable to session with focus on bed mobility and continuation of transfer training. Pt able to come to sitting up EOB with mod A with pt demonstrating improved hip mobility to maintain upright sitting. Pt able to continue transfer training with small scoots anterior and posterior at EOB, however pt unable to scoot along EOB as pt with increased LLE pain this session. Pt eager for continued sessions and current plan remains appropriate to address deficits and maximize functional independence and decrease caregiver burden. Pt continues to benefit from skilled PT services to progress toward functional mobility goals.     If plan is discharge home, recommend the following: Two people to help with walking and/or transfers;A lot of help with bathing/dressing/bathroom;Assistance with cooking/housework;Assist for transportation;Help with stairs or ramp for entrance   Can travel by private vehicle     No  Equipment Recommendations  Hoyer lift;Wheelchair (measurements PT);Wheelchair cushion (measurements PT) (amputee wheelchair)    Recommendations for Other Services       Precautions / Restrictions Precautions Precautions: Fall Precaution Comments: multiple pressure injuries, back, heels Restrictions Weight Bearing Restrictions: Yes LLE Weight Bearing: Non weight bearing     Mobility  Bed Mobility Overal bed mobility: Needs Assistance Bed Mobility: Rolling, Sidelying to Sit, Sit to Supine Rolling:  Supervision, Used rails Sidelying to sit: Mod assist   Sit to supine: Max assist   General bed mobility comments: Heavy use of bed rails and bed controls with HHA to eleavte trunk to sitting, able to roll R/L without physical assist,    Transfers Overall transfer level: Needs assistance Equipment used: None Transfers: Bed to chair/wheelchair/BSC (Simulated)            Lateral/Scoot Transfers: Mod assist, Max assist General transfer comment: Initiated transfer training sitting EOB, and making small shifts of hips forward, backward using the bad pad; noting very good use of Bil UEs to unweigh hips; Still with persistent tendnecy to sit with pelvis in a posterior tilt, making scooting a challenge    Ambulation/Gait                   Stairs             Wheelchair Mobility     Tilt Bed    Modified Rankin (Stroke Patients Only)       Balance Overall balance assessment: Needs assistance Sitting-balance support: Single extremity supported, Feet supported Sitting balance-[name]: Poor Sitting balance - Comments: posterior bias due to poor spinal and hip mobility                                    Cognition Arousal: Alert Behavior During Therapy: WFL for tasks assessed/performed Overall Cognitive Status: No family/caregiver present to determine baseline cognitive functioning Area of Impairment: Attention, Memory, Following commands, Safety/judgement, Awareness, Problem solving                   Current Attention Level: Sustained Memory: Decreased  recall of precautions, Decreased short-term memory Following Commands: Follows one step commands consistently Safety/Judgement: Decreased awareness of deficits, Decreased awareness of safety Awareness: Intellectual Problem Solving: Requires verbal cues General Comments: likely baseline, very particular about his bed pads. needs encouragement to participate.        Exercises       General Comments        Pertinent Vitals/Pain Pain Assessment Pain Assessment: Faces Faces Pain Scale: Hurts even more Pain Location: LLE, and some phantom limb pain Pain Descriptors / Indicators: Discomfort, Sore (LLE phanotm limb pain) Pain Intervention(s): Monitored during session, Limited activity within patient's tolerance    Home Living                          Prior Function            PT Goals (current goals can now be found in the care plan section) Acute Rehab PT Goals Patient Stated Goal: Be able to get home PT Goal Formulation: With patient Time For Goal Achievement: 07/14/23 Progress towards PT goals: Progressing toward goals    Frequency    Min 1X/week      PT Plan      Co-evaluation              AM-PAC PT "6 Clicks" Mobility   Outcome Measure  Help needed turning from your back to your side while in a flat bed without using bedrails?: A Little Help needed moving from lying on your back to sitting on the side of a flat bed without using bedrails?: A Lot Help needed moving to and from a bed to a chair (including a wheelchair)?: Total Help needed standing up from a chair using your arms (e.g., wheelchair or bedside chair)?: Total Help needed to walk in hospital room?: Total Help needed climbing 3-5 steps with a railing? : Total 6 Click Score: 9    End of Session Equipment Utilized During Treatment: Other (comment) (bed pads) Activity Tolerance: Patient tolerated treatment well Patient left: in bed;with call bell/phone within reach Nurse Communication: Mobility status PT Visit Diagnosis: Repeated falls (R29.6);Muscle weakness (generalized) (M62.81);Other abnormalities of gait and mobility (R26.89)     Time: 8756-4332 PT Time Calculation (min) (ACUTE ONLY): 15 min  Charges:    $Therapeutic Activity: 8-22 mins PT General Charges $$ ACUTE PT VISIT: 1 Visit                     Keaston Pile R. PTA Acute Rehabilitation  Services Office: [phone]   Catalina Antigua 07/02/2023, 12:31 PM

## 2023-07-02 NOTE — Inpatient Diabetes Management (Signed)
Inpatient Diabetes Program Recommendations  AACE/ADA: New Consensus Statement on Inpatient Glycemic Control (2015)  Target Ranges:  Prepandial:   less than 140 mg/dL      Peak postprandial:   less than 180 mg/dL (1-2 hours)      Critically ill patients:  140 - 180 mg/dL   Lab Results  Component Value Date   GLUCAP 198 (H) 07/02/2023   HGBA1C 8.4 (H) 06/28/2023    Review of Glycemic Control  Latest Reference Range & Units 07/01/23 09:00 07/01/23 13:34 07/01/23 16:17 07/01/23 20:02 07/02/23 07:47  Glucose-Capillary 70 - 99 mg/dL 884 (H) 166 (H) 063 (H) 235 (H) 198 (H)  (H): Data is abnormally high Diabetes history: DM2 Outpatient Diabetes medications: Levemir 20 units at bedtime, Novolog 8 units TID with meals Current orders for Inpatient glycemic control: Levemir 23 units at bedtime, Novolog 5 units TID with meals, Novolog 0-9 units TID with meals, Jardiance 10 mg daily  Inpatient Diabetes Program Recommendations:    Could consider slight increase to Levemir- 25 units every day.   Thanks, Lujean Rave, MSN, RNC-OB Diabetes Coordinator [phone] (8a-5p)

## 2023-07-02 NOTE — Progress Notes (Signed)
PROGRESS NOTE    Micheal Moore.  FAO:[id number] DOB: [birth year] DOA: 06/19/2023 PCP: [name], MD     Brief Narrative:  72 year old with history of CAD status post PCI April 2024, PAD, DM 2, chronic hypotension, recent hospitalization in August due to polymicrobial bacteremia requiring IV antibiotics at home.  Patient presents back to the hospital due to worsening pain in the left ankle area.  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  PT recommending SNF.       Assessment & Plan:  Principal Problem:   Osteomyelitis (HCC) Active Problems:   CAD (coronary artery disease)   Type 2 diabetes mellitus with hyperlipidemia (HCC)   PVD (peripheral vascular disease) (HCC)   Hyperlipidemia   Protein-calorie malnutrition, moderate (HCC)   Ambulatory dysfunction   MRSA infection   History of MDR Pseudomonas aeruginosa infection     Left diabetic foot infection/Osteomyelitis status post left AKA 10/11 Initial MRI suggestive of osteomyelitis/myositis.  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    Nonweightbearing on the left, wound VAC in place, ambulate with walker.  Antibiotics discontinued 10/12.   History of PAD Continue aspirin and Brilinta.  On Crestor   CAD-s/p PCI to RCA April 2024 Continue aspirin, Brilinta and statin   Diabetes mellitus type-2 Sliding scale and Accu-Chek.  Continue Levemir and Jardiance.  Adjust as necessary  Tachycardia - Sinus tachycardia, resolved   History of orthostatic hypotension Continue Florinef   Chronic ambulatory dysfunction/nonambulatory/bedbound status Poor ambulatory status, apparently has been bedbound since February 2024   Moderate malnutrition Supplements     DVT prophylaxis: Lovenox Code Status: Full code Family Communication:   Status is: Inpatient SNF placement.        Subjective: Seen at bedside, ate all of his breakfast.  No complaints   Examination:  General exam: Appears calm and comfortable  Respiratory system: Clear to auscultation. Respiratory effort normal. Cardiovascular system: S1 & S2 heard, RRR. No JVD, murmurs, rubs, gallops or clicks. No pedal edema. Gastrointestinal system: Abdomen is nondistended, soft and nontender. No organomegaly or masses felt. Normal bowel sounds heard. Central nervous system: Alert and oriented. No focal neurological deficits. Extremities: Symmetric 5 x 5 power. Skin: Left AKA with wound VAC in place Psychiatry: Judgement and insight appear normal. Mood & affect appropriate.       Diet Orders (From admission, onward)     Start     Ordered   06/28/23 1356  Diet Carb Modified Fluid consistency: Thin; Room service appropriate? Yes  Diet effective now       Question Answer Comment  Calorie Level Medium 1600-2000   Fluid consistency: Thin   Room service appropriate? Yes      06/28/23 1355            Objective: Vitals:   07/01/23 2125 07/02/23 0007 07/02/23 0335 07/02/23 0747  BP: 138/83 127/76 134/80 (!) 148/85  Pulse: 98 (!) 102 (!) 104 (!) 106  Resp:  17 18 18   Temp: 98.8 F (37.1 C) 98.5 F (36.9 C) 98.7 F (37.1 C) 98.3 F (36.8 C)  TempSrc:  Oral Oral   SpO2: 96% 99% 98% 100%  Weight:      Height:        Intake/Output Summary (Last 24 hours) at 07/02/2023 1308 Last data filed at 07/02/2023 0747 Gross per 24 hour  Intake --  Output 1780 ml  Net -1780 ml   Filed Weights   06/19/23 1247 06/21/23 0418 06/28/23 1002  Weight: 88.5 kg 88.5 kg 88.5 kg    Scheduled Meds:  vitamin C  1,000 mg Oral Daily   aspirin EC  81 mg Oral Daily   Chlorhexidine Gluconate Cloth  6 each Topical Daily   docusate sodium  100 mg Oral Daily   empagliflozin  10 mg Oral Daily   enoxaparin (LOVENOX) injection  40 mg Subcutaneous Q24H   feeding supplement  237 mL Oral BID BM   fludrocortisone   0.2 mg Oral Daily   Gerhardt's butt cream   Topical BID   hydrocerin   Topical BID   insulin aspart  0-9 Units Subcutaneous TID WC   insulin aspart  5 Units Subcutaneous TID WC   insulin detemir  23 Units Subcutaneous QHS   nutrition supplement (JUVEN)  1 packet Oral BID BM   pantoprazole  40 mg Oral Daily   polyethylene glycol  17 g Oral Daily   rosuvastatin  5 mg Oral Once per day on Monday Thursday   senna  1 tablet Oral BID   tamsulosin  0.4 mg Oral QPC supper   ticagrelor  90 mg Oral BID   zinc sulfate  220 mg Oral Daily   Continuous Infusions:  magnesium sulfate bolus IVPB      Nutritional status Signs/Symptoms: moderate fat depletion, moderate muscle depletion Interventions: Ensure Enlive (each supplement provides 350kcal and 20 grams of protein), Other (Comment) (Liberalize diet) Body mass index is 23.12 kg/m.  Data Reviewed:   CBC: Recent Labs  Lab 06/26/23 0437 06/27/23 1609 06/28/23 0743 06/29/23 0421 06/30/23 0530 07/01/23 0635 07/02/23 0507  WBC 5.7 5.5 5.4 8.2 6.8 6.5 6.4  NEUTROABS 2.9 3.1  --   --   --   --   --   HGB 11.6* 12.2* 11.3* 9.9* 9.3* 9.7* 9.2*  HCT 36.8* 38.3* 36.3* 31.3* 29.3* 30.2* 29.1*  MCV 94.8 96.7 94.3 93.4 93.0 94.4 93.0  PLT 318 336 311 233 225 236 262   Basic Metabolic Panel: Recent Labs  Lab 06/26/23 0437 06/27/23 1609 06/28/23 0743 06/29/23 0421 06/30/23 0530  NA 137 140 140 134* 132*  K 4.3 4.2 4.1 4.7 3.6  CL 102 97* 105 101 97*  CO2 22 26 25 25 25   GLUCOSE 193* 211* 231* 193* 189*  BUN 20 20 21 19 15   CREATININE 0.66 0.70 0.72 0.61 0.59*  CALCIUM 8.9 9.3 9.0 8.6* 8.5*  MG 2.4  --   --   --  2.2  PHOS 3.6  --   --   --   --    GFR: Estimated Creatinine Clearance: 104.5 mL/min (A) (by C-G formula based on SCr of 0.59 mg/dL (L)). Liver Function Tests: Recent Labs  Lab 06/26/23 0437 06/27/23 1609  AST 28 28  ALT 33 30  ALKPHOS 51 52  BILITOT 0.4 0.5  PROT 6.1* 6.5  ALBUMIN 2.7* 3.0*   No results for  input(s): "LIPASE", "AMYLASE" in the last 168 hours. No results for input(s): "AMMONIA" in the last 168 hours. Coagulation Profile: No results for input(s): "INR", "PROTIME" in the last 168 hours. Cardiac Enzymes: Recent Labs  Lab 06/28/23 0743  CKTOTAL 30*   BNP (last 3 results) No results for input(s): "PROBNP" in the last 8760 hours. HbA1C: No results for input(s): "HGBA1C" in the last 72 hours. CBG: Recent Labs  Lab 07/01/23 1334 07/01/23 1617 07/01/23  2002 07/02/23 0747 07/02/23 1305  GLUCAP 238* 183* 235* 198* 204*   Lipid Profile: No results for input(s): "CHOL", "HDL", "LDLCALC", "TRIG", "CHOLHDL", "LDLDIRECT" in the last 72 hours. Thyroid Function Tests: No results for input(s): "TSH", "T4TOTAL", "FREET4", "T3FREE", "THYROIDAB" in the last 72 hours. Anemia Panel: No results for input(s): "VITAMINB12", "FOLATE", "FERRITIN", "TIBC", "IRON", "RETICCTPCT" in the last 72 hours. Sepsis Labs: No results for input(s): "PROCALCITON", "LATICACIDVEN" in the last 168 hours.  Recent Results (from the past 240 hour(s))  MRSA Next Gen by PCR, Nasal     Status: Abnormal   Collection Time: 06/27/23  4:16 PM   Specimen: Nasal Mucosa; Nasal Swab  Result Value Ref Range Status   MRSA by PCR Next Gen DETECTED (A) NOT DETECTED Final    Comment: RESULT CALLED TO, READ BACK BY AND VERIFIED WITH: RN Lamount Cranker [phone] @ 2101 FH (NOTE) The GeneXpert MRSA Assay (FDA approved for NASAL specimens only), is one component of a comprehensive MRSA colonization surveillance program. It is not intended to diagnose MRSA infection nor to guide or monitor treatment for MRSA infections. Test performance is not FDA approved in patients less than 60 years old. Performed at [facility], [address], [place], [place] [address]          Radiology Studies: No results found.         LOS: 13 days   Time spent= 35 mins    Miguel Rota, MD [facility]  If 7PM-7AM,  please contact night-coverage  07/02/2023, 1:08 PM

## 2023-07-03 DIAGNOSIS — M86162 Other acute osteomyelitis, left tibia and fibula: Secondary | ICD-10-CM | POA: Diagnosis not present

## 2023-07-03 LAB — GLUCOSE, CAPILLARY
Glucose-Capillary: 172 mg/dL — ABNORMAL HIGH (ref 70–99)
Glucose-Capillary: 208 mg/dL — ABNORMAL HIGH (ref 70–99)
Glucose-Capillary: 262 mg/dL — ABNORMAL HIGH (ref 70–99)
Glucose-Capillary: 202 mg/dL — ABNORMAL HIGH (ref 70–99)

## 2023-07-03 LAB — CBC
HCT: 29.3 % — ABNORMAL LOW (ref 39.0–52.0)
Hemoglobin: 9.4 g/dL — ABNORMAL LOW (ref 13.0–17.0)
MCH: 30.3 pg (ref 26.0–34.0)
MCHC: 32.1 g/dL (ref 30.0–36.0)
MCV: 94.5 fL (ref 80.0–100.0)
Platelets: 341 10*3/uL (ref 150–400)
RBC: 3.1 MIL/uL — ABNORMAL LOW (ref 4.22–5.81)
RDW: 15.7 % — ABNORMAL HIGH (ref 11.5–15.5)
WBC: 5.5 10*3/uL (ref 4.0–10.5)
nRBC: 0 % (ref 0.0–0.2)

## 2023-07-03 LAB — BASIC METABOLIC PANEL
Anion gap: 9 (ref 5–15)
BUN: 26 mg/dL — ABNORMAL HIGH (ref 8–23)
CO2: 25 mmol/L (ref 22–32)
Calcium: 8.7 mg/dL — ABNORMAL LOW (ref 8.9–10.3)
Chloride: 101 mmol/L (ref 98–111)
Creatinine, Ser: 0.77 mg/dL (ref 0.61–1.24)
GFR, Estimated: >60 mL/min (ref >60)
Glucose, Bld: 206 mg/dL — ABNORMAL HIGH (ref 70–99)
Potassium: 3.9 mmol/L (ref 3.5–5.1)
Sodium: 135 mmol/L (ref 135–145)

## 2023-07-03 LAB — MAGNESIUM: Magnesium: 2.2 mg/dL (ref 1.7–2.4)

## 2023-07-03 LAB — CREATININE, SERUM
Creatinine, Ser: 0.77 mg/dL (ref 0.61–1.24)
GFR, Estimated: >60 mL/min (ref >60)

## 2023-07-03 MED ORDER — ADULT MULTIVITAMIN W/MINERALS CH
1.0000 | ORAL_TABLET | Freq: Every day | ORAL | Status: DC
  Administered 2023-07-03 – 2023-07-05 (×3): 1 via ORAL
  Filled 2023-07-03 (×3): qty 1

## 2023-07-03 MED ORDER — INSULIN DETEMIR 100 UNIT/ML ~~LOC~~ SOLN
25.0000 [IU] | Freq: Every day | SUBCUTANEOUS | Status: DC
  Administered 2023-07-03 – 2023-07-04 (×2): 25 [IU] via SUBCUTANEOUS
  Filled 2023-07-03 (×3): qty 0.25

## 2023-07-03 MED ORDER — CHLORHEXIDINE GLUCONATE CLOTH 2 % EX PADS
6.0000 | MEDICATED_PAD | Freq: Every day | CUTANEOUS | Status: DC
  Administered 2023-07-03 – 2023-07-05 (×3): 6 via TOPICAL

## 2023-07-03 MED ORDER — MUPIROCIN 2 % EX OINT
1.0000 | TOPICAL_OINTMENT | Freq: Two times a day (BID) | CUTANEOUS | Status: DC
  Administered 2023-07-03 – 2023-07-05 (×5): 1 via NASAL
  Filled 2023-07-03 (×2): qty 22

## 2023-07-03 MED ORDER — INSULIN ASPART 100 UNIT/ML IJ SOLN
8.0000 [IU] | Freq: Three times a day (TID) | INTRAMUSCULAR | Status: DC
  Administered 2023-07-03 – 2023-07-05 (×7): 8 [IU] via SUBCUTANEOUS

## 2023-07-03 NOTE — Inpatient Diabetes Management (Signed)
Inpatient Diabetes Program Recommendations  AACE/ADA: New Consensus Statement on Inpatient Glycemic Control (2015)  Target Ranges:  Prepandial:   less than 140 mg/dL      Peak postprandial:   less than 180 mg/dL (1-2 hours)      Critically ill patients:  140 - 180 mg/dL   Lab Results  Component Value Date   GLUCAP 172 (H) 07/03/2023   HGBA1C 8.4 (H) 06/28/2023    Review of Glycemic Control  Latest Reference Range & Units 07/02/23 07:47 07/02/23 13:05 07/02/23 17:18 07/02/23 19:33 07/03/23 07:29  Glucose-Capillary 70 - 99 mg/dL 696 (H) 295 (H) 284 (H) 205 (H) 172 (H)   Diabetes history: DM2 Outpatient Diabetes medications: Levemir 20 units at bedtime, Novolog 8 units TID with meals Current orders for Inpatient glycemic control:  Levemir 23 units qhs Novolog 5 units tid meal coverage Novolog 0-9 units tid Jardiance 10 mg daily  Ensure Enlive bid between meals (40 grams of carbs)  Inpatient Diabetes Program Recommendations:    -   Increase Levemir to 25 units -   Increase Novolog meal coverage to 8 units tid if eating >50% of meals  Thanks, Christena Deem RN, MSN, BC-ADM Inpatient Diabetes Coordinator Team Pager [phone] (8a-5p)

## 2023-07-03 NOTE — Progress Notes (Signed)
Nutrition Follow-up  DOCUMENTATION CODES:   Non-severe (moderate) malnutrition in context of chronic illness  INTERVENTION:  Ensure BID, Juven BID. Snack every day, MVM   NUTRITION DIAGNOSIS:   Moderate Malnutrition related to chronic illness as evidenced by moderate fat depletion, moderate muscle depletion.    GOAL:   Patient will meet greater than or equal to 90% of their needs    MONITOR:   PO intake, Supplement acceptance, Labs, Weight trends  REASON FOR ASSESSMENT:   Malnutrition Screening Tool    ASSESSMENT:  72 y.o. M, Admitted 10/2 Osteomyelitis. PMH; CAD s/p PCI April 2024, PAD, DM-2, DVT, Cancer (melanoma). 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  Stated he is starving offered snacks and agreeable to this.  Admit weight: 88.5 kg Current weight: 88.5 kg ( suspect inaccurate related to recent AKA)   Average Meal Intake:  100% intake self reported at time of visit.   Nutritionally Relevant Medications: Scheduled Meds:  vitamin C  1,000 mg Oral Daily   empagliflozin  10 mg Oral Daily   feeding supplement  237 mL Oral BID BM   insulin aspart  0-9 Units Subcutaneous TID WC   insulin aspart  5 Units Subcutaneous TID WC   insulin detemir  23 Units Subcutaneous QHS   nutrition supplement (JUVEN)  1 packet Oral BID BM   polyethylene glycol  17 g Oral Daily   senna  1 tablet Oral BID   ticagrelor  90 mg Oral BID   zinc sulfate  220 mg Oral Daily    Continuous Infusions:  PRN Meds:.acetaminophen **OR** acetaminophen, alum & mag hydroxide-simeth, bisacodyl, bisacodyl, guaiFENesin-dextromethorphan, hydrALAZINE, HYDROmorphone (DILAUDID) injection, hydrOXYzine, ipratropium-albuterol, magnesium citrate, magnesium sulfate bolus IVPB, metoprolol tartrate, ondansetron **OR** ondansetron (ZOFRAN) IV, oxyCODONE, oxyCODONE, phenol, traZODone  Labs Reviewed:  HgbA1c 8.4    NUTRITION - FOCUSED PHYSICAL EXAM:  Flowsheet Row  Most Recent Value  Orbital Region Moderate depletion  Upper Arm Region Moderate depletion  Thoracic and Lumbar Region Moderate depletion  Buccal Region Moderate depletion  Temple Region Severe depletion  Clavicle Bone Region Severe depletion  Clavicle and Acromion Bone Region Severe depletion  Scapular Bone Region Unable to assess  Dorsal Hand Severe depletion  Patellar Region Moderate depletion  Anterior Thigh Region Moderate depletion  Posterior Calf Region Moderate depletion  Edema (RD Assessment) None  Hair Reviewed  Eyes Reviewed  Mouth Reviewed  Skin Reviewed  Nails Reviewed       Diet Order:   Diet Order             Diet Carb Modified Fluid consistency: Thin; Room service appropriate? Yes  Diet effective now                   EDUCATION NEEDS:   No education needs have been identified at this time  Skin:  Skin Assessment: Reviewed RN Assessment  Last BM:  10/3  Height:   Ht Readings from Last 1 Encounters:  06/28/23 6\' 5"  (1.956 m)    Weight:   Wt Readings from Last 1 Encounters:  06/28/23 88.5 kg    Ideal Body Weight:     BMI:  Body mass index is 23.12 kg/m.  Estimated Nutritional Needs:   Kcal:  1750-2275 Kcal  Protein:  90-120 g  Fluid:  48ml/kcal    Jamelle Haring RDN, LDN Clinical Dietitian  RDN pager # available on Amion

## 2023-07-03 NOTE — Progress Notes (Addendum)
PROGRESS NOTE    Micheal Moore.  WUJ:[id number] DOB: 1951/02/07 DOA: 06/19/2023 PCP: [name], MD     Brief Narrative:  72 year old with history of CAD status post PCI April 2024, PAD, DM 2, chronic hypotension, recent hospitalization in August due to polymicrobial bacteremia requiring IV antibiotics at home.  Patient presents back to the hospital due to worsening pain in the left ankle area.  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  PT recommending SNF.       Assessment & Plan:  Principal Problem:   Osteomyelitis (HCC) Active Problems:   CAD (coronary artery disease)   Type 2 diabetes mellitus with hyperlipidemia (HCC)   PVD (peripheral vascular disease) (HCC)   Hyperlipidemia   Protein-calorie malnutrition, moderate (HCC)   Ambulatory dysfunction   MRSA infection   History of MDR Pseudomonas aeruginosa infection     Left diabetic foot infection/Osteomyelitis status post left AKA 10/11 Initial MRI suggestive of osteomyelitis/myositis.  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    Nonweightbearing on the left, wound vac discontinued today, photo of wound today (currently dressed) shows appropriate healing. Antibiotics discontinued 10/12. No ortho notes since 10/12, per secure chat w/ dr. Lajoyce Corners: "I will see him in the office 1 week after discharge. Will plan on suture harvesting at follow-up."  bph Urinary retention? Overnight inability to void and bladder scan showing 900 but patient reports he was then able to void and has been voiding without difficulty ever since - will monitor - cont home flomax   History of PAD Continue aspirin and Brilinta.  On Crestor   CAD-s/p PCI to RCA April 2024 Continue aspirin, Brilinta and statin   Diabetes mellitus type-2 Sliding scale and Accu-Chek.  Continue Levemir and Jardiance.  Will increase levemir to 25 and mealtime to 8, appreciate dm educator recs  Tachycardia - Sinus tachycardia, resolved   History of orthostatic hypotension Continue Florinef   Chronic ambulatory dysfunction/nonambulatory/bedbound status Poor ambulatory status, apparently has been bedbound since February 2024   Moderate malnutrition Supplements     DVT prophylaxis: Lovenox Code Status: Full code Family Communication:  sister updated @ bedside 10/16 Status is: Inpatient SNF placement.       Subjective: Seen at bedside, ate all of his breakfast.  Able to urinate. Bm yesterday. Able to urinate   Examination:  General exam: Appears calm and comfortable  Respiratory system: Clear to auscultation. Respiratory effort normal. Cardiovascular system: S1 & S2 heard, RRR.   Gastrointestinal system: Abdomen is nondistended, soft and nontender. No organomegaly or masses felt.   Central nervous system: Alert and oriented. No focal neurological deficits. Extremities: Symmetric 5 x 5 power. Skin: Left AKA with dressing Psychiatry: Judgement and insight appear normal. Mood & affect appropriate.       Diet Orders (From admission, onward)     Start     Ordered   06/28/23 1356  Diet Carb Modified Fluid consistency: Thin; Room service appropriate? Yes  Diet effective now       Question Answer Comment  Calorie Level Medium 1600-2000   Fluid consistency: Thin   Room service appropriate? Yes      06/28/23 1355            Objective: Vitals:   07/02/23 1931 07/03/23 0730 07/03/23 1149 07/03/23 1156  BP: 134/72 (!) 142/90 (!) 158/91 (!) 142/80  Pulse: Marland Kitchen)  103 (!) 104 (!) 109 (!) 105  Resp: 18 18 18    Temp: 98.5 F (36.9 C) 98.2 F (36.8 C) 98.3 F (36.8 C)   TempSrc: Oral Oral Oral   SpO2: 97% 99% 100% 100%  Weight:      Height:        Intake/Output Summary (Last 24 hours) at 07/03/2023 1450 Last data filed at 07/03/2023 0809 Gross per 24 hour  Intake --  Output 1700  ml  Net -1700 ml   Filed Weights   06/19/23 1247 06/21/23 0418 06/28/23 1002  Weight: 88.5 kg 88.5 kg 88.5 kg    Scheduled Meds:  vitamin C  1,000 mg Oral Daily   aspirin EC  81 mg Oral Daily   Chlorhexidine Gluconate Cloth  6 each Topical Q0600   docusate sodium  100 mg Oral Daily   empagliflozin  10 mg Oral Daily   enoxaparin (LOVENOX) injection  40 mg Subcutaneous Q24H   feeding supplement  237 mL Oral BID BM   fludrocortisone  0.2 mg Oral Daily   Gerhardt's butt cream   Topical BID   hydrocerin   Topical BID   insulin aspart  0-9 Units Subcutaneous TID WC   insulin aspart  5 Units Subcutaneous TID WC   insulin detemir  23 Units Subcutaneous QHS   multivitamin with minerals  1 tablet Oral Daily   mupirocin ointment  1 Application Nasal BID   nutrition supplement (JUVEN)  1 packet Oral BID BM   pantoprazole  40 mg Oral Daily   polyethylene glycol  17 g Oral Daily   rosuvastatin  5 mg Oral Once per day on Monday Thursday   senna  1 tablet Oral BID   tamsulosin  0.4 mg Oral QPC supper   ticagrelor  90 mg Oral BID   zinc sulfate  220 mg Oral Daily   Continuous Infusions:  magnesium sulfate bolus IVPB      Nutritional status Signs/Symptoms: moderate fat depletion, moderate muscle depletion Interventions: Ensure Enlive (each supplement provides 350kcal and 20 grams of protein), Other (Comment) (Liberalize diet) Body mass index is 23.12 kg/m.  Data Reviewed:   CBC: Recent Labs  Lab 06/27/23 1609 06/28/23 0743 06/29/23 0421 06/30/23 0530 07/01/23 0635 07/02/23 0507 07/03/23 0456  WBC 5.5   < > 8.2 6.8 6.5 6.4 5.5  NEUTROABS 3.1  --   --   --   --   --   --   HGB 12.2*   < > 9.9* 9.3* 9.7* 9.2* 9.4*  HCT 38.3*   < > 31.3* 29.3* 30.2* 29.1* 29.3*  MCV 96.7   < > 93.4 93.0 94.4 93.0 94.5  PLT 336   < > 233 225 236 262 341   < > = values in this interval not displayed.   Basic Metabolic Panel: Recent Labs  Lab 06/27/23 1609 06/28/23 0743 06/29/23 0421  06/30/23 0530 07/02/23 2350 07/03/23 0456  NA 140 140 134* 132* 135  --   K 4.2 4.1 4.7 3.6 3.9  --   CL 97* 105 101 97* 101  --   CO2 26 25 25 25 25   --   GLUCOSE 211* 231* 193* 189* 206*  --   BUN 20 21 19 15  26*  --   CREATININE 0.70 0.72 0.61 0.59* 0.77 0.77  CALCIUM 9.3 9.0 8.6* 8.5* 8.7*  --   MG  --   --   --  2.2 2.2  --  GFR: Estimated Creatinine Clearance: 104.5 mL/min (by C-G formula based on SCr of 0.77 mg/dL). Liver Function Tests: Recent Labs  Lab 06/27/23 1609  AST 28  ALT 30  ALKPHOS 52  BILITOT 0.5  PROT 6.5  ALBUMIN 3.0*   No results for input(s): "LIPASE", "AMYLASE" in the last 168 hours. No results for input(s): "AMMONIA" in the last 168 hours. Coagulation Profile: No results for input(s): "INR", "PROTIME" in the last 168 hours. Cardiac Enzymes: Recent Labs  Lab 06/28/23 0743  CKTOTAL 30*   BNP (last 3 results) No results for input(s): "PROBNP" in the last 8760 hours. HbA1C: No results for input(s): "HGBA1C" in the last 72 hours. CBG: Recent Labs  Lab 07/02/23 1305 07/02/23 1718 07/02/23 1933 07/03/23 0729 07/03/23 1147  GLUCAP 204* 236* 205* 172* 208*   Lipid Profile: No results for input(s): "CHOL", "HDL", "LDLCALC", "TRIG", "CHOLHDL", "LDLDIRECT" in the last 72 hours. Thyroid Function Tests: No results for input(s): "TSH", "T4TOTAL", "FREET4", "T3FREE", "THYROIDAB" in the last 72 hours. Anemia Panel: No results for input(s): "VITAMINB12", "FOLATE", "FERRITIN", "TIBC", "IRON", "RETICCTPCT" in the last 72 hours. Sepsis Labs: No results for input(s): "PROCALCITON", "LATICACIDVEN" in the last 168 hours.  Recent Results (from the past 240 hour(s))  MRSA Next Gen by PCR, Nasal     Status: Abnormal   Collection Time: 06/27/23  4:16 PM   Specimen: Nasal Mucosa; Nasal Swab  Result Value Ref Range Status   MRSA by PCR Next Gen DETECTED (A) NOT DETECTED Final    Comment: RESULT CALLED TO, READ BACK BY AND VERIFIED WITH: RN Lamount Cranker  [phone] @ 2101 FH (NOTE) The GeneXpert MRSA Assay (FDA approved for NASAL specimens only), is one component of a comprehensive MRSA colonization surveillance program. It is not intended to diagnose MRSA infection nor to guide or monitor treatment for MRSA infections. Test performance is not FDA approved in patients less than 25 years old. Performed at [facility], [address], [place], [place] [address]          Radiology Studies: No results found.         LOS: 14 days   Time spent= 25 mins    Silvano Bilis, MD [facility]  If 7PM-7AM, please contact night-coverage  07/03/2023, 2:50 PM

## 2023-07-03 NOTE — Progress Notes (Signed)
  Patient's nurse reporting patient is unable to void and bladder scan showing 960cc urinary retention.  Unsure history of BPH.  Per chart review patient has poor ambulatory status and mostly bedbound. - Doing trial of in and out catheter first before placing an Foley.  Tereasa Coop, MD [facility] 07/03/2023, 4:28 AM

## 2023-07-03 NOTE — Plan of Care (Signed)
Problem: Education: Goal: Understanding of CV disease, CV risk reduction, and recovery process will improve Outcome: Progressing Goal: Individualized Educational Video(s) Outcome: Progressing   Problem: Activity: Goal: Ability to return to baseline activity level will improve Outcome: Progressing   Problem: Cardiovascular: Goal: Ability to achieve and maintain adequate cardiovascular perfusion will improve Outcome: Progressing Goal: Vascular access site(s) Level 0-1 will be maintained Outcome: Progressing   Problem: Health Behavior/Discharge Planning: Goal: Ability to safely manage health-related needs after discharge will improve Outcome: Progressing   Problem: Education: Goal: Ability to describe self-care measures that may prevent or decrease complications (Diabetes Survival Skills Education) will improve Outcome: Progressing Goal: Individualized Educational Video(s) Outcome: Progressing   Problem: Cardiac: Goal: Ability to maintain an adequate cardiac output will improve Outcome: Progressing   Problem: Health Behavior/Discharge Planning: Goal: Ability to identify and utilize available resources and services will improve Outcome: Progressing Goal: Ability to manage health-related needs will improve Outcome: Progressing   Problem: Fluid Volume: Goal: Ability to achieve a balanced intake and output will improve Outcome: Progressing   Problem: Metabolic: Goal: Ability to maintain appropriate glucose levels will improve Outcome: Progressing   Problem: Nutritional: Goal: Maintenance of adequate nutrition will improve Outcome: Progressing Goal: Maintenance of adequate weight for body size and type will improve Outcome: Progressing   Problem: Respiratory: Goal: Will regain and/or maintain adequate ventilation Outcome: Progressing   Problem: Urinary Elimination: Goal: Ability to achieve and maintain adequate renal perfusion and functioning will improve Outcome:  Progressing   Problem: Education: Goal: Knowledge of the prescribed therapeutic regimen will improve Outcome: Progressing Goal: Understanding of discharge needs will improve Outcome: Progressing Goal: Individualized Educational Video(s) Outcome: Progressing   Problem: Activity: Goal: Ability to avoid complications of mobility impairment will improve Outcome: Progressing Goal: Ability to tolerate increased activity will improve Outcome: Progressing   Problem: Clinical Measurements: Goal: Postoperative complications will be avoided or minimized Outcome: Progressing   Problem: Pain Management: Goal: Pain level will decrease with appropriate interventions Outcome: Progressing   Problem: Skin Integrity: Goal: Will show signs of wound healing Outcome: Progressing   Problem: Education: Goal: Ability to describe self-care measures that may prevent or decrease complications (Diabetes Survival Skills Education) will improve Outcome: Progressing Goal: Individualized Educational Video(s) Outcome: Progressing   Problem: Coping: Goal: Ability to adjust to condition or change in health will improve Outcome: Progressing   Problem: Fluid Volume: Goal: Ability to maintain a balanced intake and output will improve Outcome: Progressing   Problem: Health Behavior/Discharge Planning: Goal: Ability to identify and utilize available resources and services will improve Outcome: Progressing Goal: Ability to manage health-related needs will improve Outcome: Progressing   Problem: Metabolic: Goal: Ability to maintain appropriate glucose levels will improve Outcome: Progressing   Problem: Nutritional: Goal: Maintenance of adequate nutrition will improve Outcome: Progressing Goal: Progress toward achieving an optimal weight will improve Outcome: Progressing   Problem: Skin Integrity: Goal: Risk for impaired skin integrity will decrease Outcome: Progressing   Problem: Tissue  Perfusion: Goal: Adequacy of tissue perfusion will improve Outcome: Progressing   Problem: Education: Goal: Knowledge of General Education information will improve Description: Including pain rating scale, medication(s)/side effects and non-pharmacologic comfort measures Outcome: Progressing   Problem: Health Behavior/Discharge Planning: Goal: Ability to manage health-related needs will improve Outcome: Progressing   Problem: Clinical Measurements: Goal: Ability to maintain clinical measurements within normal limits will improve Outcome: Progressing Goal: Will remain free from infection Outcome: Progressing Goal: Diagnostic test results will improve Outcome: Progressing Goal: Respiratory complications will  improve Outcome: Progressing Goal: Cardiovascular complication will be avoided Outcome: Progressing   Problem: Activity: Goal: Risk for activity intolerance will decrease Outcome: Progressing   Problem: Nutrition: Goal: Adequate nutrition will be maintained Outcome: Progressing

## 2023-07-04 DIAGNOSIS — E1169 Type 2 diabetes mellitus with other specified complication: Secondary | ICD-10-CM | POA: Diagnosis not present

## 2023-07-04 DIAGNOSIS — L97509 Non-pressure chronic ulcer of other part of unspecified foot with unspecified severity: Secondary | ICD-10-CM | POA: Diagnosis not present

## 2023-07-04 DIAGNOSIS — M869 Osteomyelitis, unspecified: Secondary | ICD-10-CM | POA: Diagnosis not present

## 2023-07-04 DIAGNOSIS — E11621 Type 2 diabetes mellitus with foot ulcer: Secondary | ICD-10-CM | POA: Diagnosis not present

## 2023-07-04 LAB — GLUCOSE, CAPILLARY
Glucose-Capillary: 149 mg/dL — ABNORMAL HIGH (ref 70–99)
Glucose-Capillary: 207 mg/dL — ABNORMAL HIGH (ref 70–99)
Glucose-Capillary: 202 mg/dL — ABNORMAL HIGH (ref 70–99)
Glucose-Capillary: 188 mg/dL — ABNORMAL HIGH (ref 70–99)

## 2023-07-04 MED ORDER — MAGNESIUM CITRATE PO SOLN
1.0000 | Freq: Once | ORAL | Status: AC
  Administered 2023-07-04: 1 via ORAL
  Filled 2023-07-04: qty 296

## 2023-07-04 NOTE — Plan of Care (Addendum)
1500: RN took over care for pt, RN assessed pt. Pt had large BM x1  Problem: Education: Goal: Understanding of CV disease, CV risk reduction, and recovery process will improve Outcome: Progressing Goal: Individualized Educational Video(s) Outcome: Progressing   Problem: Activity: Goal: Ability to return to baseline activity level will improve Outcome: Progressing   Problem: Cardiovascular: Goal: Ability to achieve and maintain adequate cardiovascular perfusion will improve Outcome: Progressing Goal: Vascular access site(s) Level 0-1 will be maintained Outcome: Progressing   Problem: Health Behavior/Discharge Planning: Goal: Ability to safely manage health-related needs after discharge will improve Outcome: Progressing   Problem: Education: Goal: Ability to describe self-care measures that may prevent or decrease complications (Diabetes Survival Skills Education) will improve Outcome: Progressing Goal: Individualized Educational Video(s) Outcome: Progressing   Problem: Cardiac: Goal: Ability to maintain an adequate cardiac output will improve Outcome: Progressing   Problem: Health Behavior/Discharge Planning: Goal: Ability to identify and utilize available resources and services will improve Outcome: Progressing Goal: Ability to manage health-related needs will improve Outcome: Progressing   Problem: Fluid Volume: Goal: Ability to achieve a balanced intake and output will improve Outcome: Progressing   Problem: Metabolic: Goal: Ability to maintain appropriate glucose levels will improve Outcome: Progressing   Problem: Nutritional: Goal: Maintenance of adequate nutrition will improve Outcome: Progressing Goal: Maintenance of adequate weight for body size and type will improve Outcome: Progressing   Problem: Respiratory: Goal: Will regain and/or maintain adequate ventilation Outcome: Progressing   Problem: Urinary Elimination: Goal: Ability to achieve and  maintain adequate renal perfusion and functioning will improve Outcome: Progressing   Problem: Education: Goal: Knowledge of the prescribed therapeutic regimen will improve Outcome: Progressing Goal: Understanding of discharge needs will improve Outcome: Progressing Goal: Individualized Educational Video(s) Outcome: Progressing   Problem: Activity: Goal: Ability to avoid complications of mobility impairment will improve Outcome: Progressing Goal: Ability to tolerate increased activity will improve Outcome: Progressing   Problem: Clinical Measurements: Goal: Postoperative complications will be avoided or minimized Outcome: Progressing   Problem: Pain Management: Goal: Pain level will decrease with appropriate interventions Outcome: Progressing   Problem: Skin Integrity: Goal: Will show signs of wound healing Outcome: Progressing   Problem: Education: Goal: Ability to describe self-care measures that may prevent or decrease complications (Diabetes Survival Skills Education) will improve Outcome: Progressing Goal: Individualized Educational Video(s) Outcome: Progressing   Problem: Coping: Goal: Ability to adjust to condition or change in health will improve Outcome: Progressing   Problem: Fluid Volume: Goal: Ability to maintain a balanced intake and output will improve Outcome: Progressing   Problem: Health Behavior/Discharge Planning: Goal: Ability to identify and utilize available resources and services will improve Outcome: Progressing Goal: Ability to manage health-related needs will improve Outcome: Progressing   Problem: Metabolic: Goal: Ability to maintain appropriate glucose levels will improve Outcome: Progressing   Problem: Nutritional: Goal: Maintenance of adequate nutrition will improve Outcome: Progressing Goal: Progress toward achieving an optimal weight will improve Outcome: Progressing   Problem: Skin Integrity: Goal: Risk for impaired skin  integrity will decrease Outcome: Progressing   Problem: Tissue Perfusion: Goal: Adequacy of tissue perfusion will improve Outcome: Progressing   Problem: Education: Goal: Knowledge of General Education information will improve Description: Including pain rating scale, medication(s)/side effects and non-pharmacologic comfort measures Outcome: Progressing   Problem: Health Behavior/Discharge Planning: Goal: Ability to manage health-related needs will improve Outcome: Progressing   Problem: Clinical Measurements: Goal: Ability to maintain clinical measurements within normal limits will improve Outcome: Progressing Goal: Will remain free  from infection Outcome: Progressing Goal: Diagnostic test results will improve Outcome: Progressing Goal: Respiratory complications will improve Outcome: Progressing Goal: Cardiovascular complication will be avoided Outcome: Progressing   Problem: Activity: Goal: Risk for activity intolerance will decrease Outcome: Progressing   Problem: Nutrition: Goal: Adequate nutrition will be maintained Outcome: Progressing

## 2023-07-04 NOTE — Progress Notes (Signed)
Physical Therapy Treatment Patient Details Name: Micheal Moore. MRN: [id number] DOB: 1951-04-13 Today's Date: 07/04/2023   History of Present Illness 72 y.o. male admitted 10/2 with increased pain of left ankle wound with osteomyelitis. 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 PMhx: admissions:7/24 - 8/5 and 8/7-8/17 for bacteremia. CAD s/p PCI, syncope, orthostatic hypotension, T2DM, DVT, PAD, right elbow melanoma s/p excision, L THA x2 (due to complications)    PT Comments  Pt resting in bed on arrival and agreeable to session with steady progress towards acute goals. Pt continues to be limited by impaired balance/postural reactions, decreased activity tolerance and general weakness. Pt requiring mod A to come to sitting EOB with max cues needed for sequencing and technique. Pt needing increased cues to remain on task this session as pt perseverating about bed pads, locations of personal items during mobility despite reassurance and locating of all items, and anticipation of pain. Pt able to perform minimal scooting x3 along EOB with max A to simulate OOB transfers however pt declining transfer to chair and impulsively lying back down. Current plan remains appropriate to address deficits and maximize functional independence and decrease caregiver burden. Pt continues to benefit from skilled PT services to progress toward functional mobility goals.     If plan is discharge home, recommend the following: Two people to help with walking and/or transfers;A lot of help with bathing/dressing/bathroom;Assistance with cooking/housework;Assist for transportation;Help with stairs or ramp for entrance   Can travel by private vehicle     No  Equipment Recommendations  Hoyer lift;Wheelchair (measurements PT);Wheelchair cushion (measurements PT) (amputee wheelchair)    Recommendations for Other Services       Precautions / Restrictions Precautions Precautions: Fall Precaution Comments: multiple pressure injuries, back,  heels Restrictions Weight Bearing Restrictions: Yes RLE Weight Bearing: Weight bearing as tolerated LLE Weight Bearing: Non weight bearing     Mobility  Bed Mobility Overal bed mobility: Needs Assistance Bed Mobility: Rolling, Sidelying to Sit, Sit to Supine Rolling: Supervision, Used rails Sidelying to sit: Mod assist   Sit to supine: Max assist   General bed mobility comments: Heavy use of bed rails and bed controls with HHA to eleavte trunk to sitting, able to roll R/L without physical assist,    Transfers Overall transfer level: Needs assistance Equipment used: None Transfers: Bed to chair/wheelchair/BSC (Simulated)            Lateral/Scoot Transfers: Mod assist, Max assist General transfer comment: trasnfer training sitting EOB, and making small shifts of hips forward, backward using the bad pad; noting very good use of Bil UEs to unweigh hips; Still with persistent tendnecy to sit with pelvis in a posterior tilt, making scooting a challenge, pt declining trasnfer up to chair, impulsively lying back down multiple times during session needing max encouragement to continue session    Ambulation/Gait                   Stairs             Wheelchair Mobility     Tilt Bed    Modified Rankin (Stroke Patients Only)       Balance Overall balance assessment: Needs assistance Sitting-balance support: Single extremity supported, Feet supported Sitting balance-[name]: Poor Sitting balance - Comments: posterior bias due to poor spinal and hip mobility  Cognition Arousal: Alert Behavior During Therapy: WFL for tasks assessed/performed Overall Cognitive Status: No family/caregiver present to determine baseline cognitive functioning Area of Impairment: Attention, Memory, Following commands, Safety/judgement, Awareness, Problem solving                   Current Attention Level:  Sustained Memory: Decreased recall of precautions, Decreased short-term memory Following Commands: Follows one step commands consistently Safety/Judgement: Decreased awareness of deficits, Decreased awareness of safety Awareness: Intellectual Problem Solving: Requires verbal cues General Comments: likely baseline, very particular about his bed pads and general needs with his items, can be demanding and easily aggitated, needs encouragement to participate and max cues for redirection to task        Exercises      General Comments        Pertinent Vitals/Pain Pain Assessment Pain Assessment: Faces Faces Pain Scale: Hurts little more Pain Location: LLE, and some phantom limb pain Pain Descriptors / Indicators: Discomfort, Sore (LLE phanotm limb pain) Pain Intervention(s): Monitored during session, Limited activity within patient's tolerance    Home Living                          Prior Function            PT Goals (current goals can now be found in the care plan section) Acute Rehab PT Goals Patient Stated Goal: Be able to get home PT Goal Formulation: With patient Time For Goal Achievement: 07/14/23 Progress towards PT goals: Progressing toward goals    Frequency    Min 1X/week      PT Plan      Co-evaluation              AM-PAC PT "6 Clicks" Mobility   Outcome Measure  Help needed turning from your back to your side while in a flat bed without using bedrails?: A Little Help needed moving from lying on your back to sitting on the side of a flat bed without using bedrails?: A Lot Help needed moving to and from a bed to a chair (including a wheelchair)?: Total Help needed standing up from a chair using your arms (e.g., wheelchair or bedside chair)?: Total Help needed to walk in hospital room?: Total Help needed climbing 3-5 steps with a railing? : Total 6 Click Score: 9    End of Session Equipment Utilized During Treatment: Other (comment)  (bed pads) Activity Tolerance: Patient tolerated treatment well Patient left: in bed;with call bell/phone within reach;with bed alarm set Nurse Communication: Mobility status PT Visit Diagnosis: Repeated falls (R29.6);Muscle weakness (generalized) (M62.81);Other abnormalities of gait and mobility (R26.89)     Time: 8315-1761 PT Time Calculation (min) (ACUTE ONLY): 17 min  Charges:    $Therapeutic Activity: 8-22 mins PT General Charges $$ ACUTE PT VISIT: 1 Visit                     Miles Borkowski R. PTA Acute Rehabilitation Services Office: [phone]   Catalina Antigua 07/04/2023, 12:06 PM

## 2023-07-04 NOTE — Progress Notes (Signed)
PROGRESS NOTE    Micheal Moore.  [id number]:[id number] DOB: [birth year] DOA: 06/19/2023 PCP: [name], MD    Brief Narrative:  72 year old with history of CAD status post PCI April 2024, PAD, DM 2, chronic hypotension, recent hospitalization in August due to polymicrobial bacteremia requiring IV antibiotics at home. Patient presented back to the hospital due to worsening pain in the left ankle area. 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 PT recommending SNF.  Medically stable.  Waiting for bed.  Subjective: Patient seen and examined.  Denies any complaints.  Pain controlled with oral pain medications.  He is looking forward to go to rehab.  Wound was examined yesterday.   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    Nonweightbearing on the left, wound vac discontinued.  Antibiotics discontinued 10/12.  Surgically stable as per surgery.  Follow-up in the office after discharge.   Pain managed with oral oxycodone.  Also continue strong bowel regimen.   BPH with urinary retention. Now voiding normally.  On Flomax.   History of PAD Continue aspirin and Brilinta.  On Crestor   CAD-s/p PCI to RCA April 2024 Continue aspirin, Brilinta and statin   Diabetes mellitus type-2 Sliding scale and Accu-Chek.  Continue Levemir and Jardiance.  Stable on increased dose of insulin.     History of orthostatic hypotension Continue Florinef.  Stable.   Chronic ambulatory dysfunction/nonambulatory/bedbound status Poor ambulatory status, apparently has been bedbound since February 2024.  Work with PT OT.   Moderate malnutrition Supplements   Medically stable to transfer to SNF.   DVT prophylaxis: SCD's  Start: 06/28/23 1356 enoxaparin (LOVENOX) injection 40 mg Start: 06/19/23 2200   Code Status: Full code Family Communication: None at the bedside Disposition Plan: Status is: Inpatient Remains inpatient appropriate because: Waiting for SNF.     Consultants:  Orthopedics Infectious disease  Procedures:  Left above-knee amputation  Antimicrobials:  Completed antibiotic therapy     Objective: Vitals:   07/03/23 1647 07/03/23 1951 07/04/23 0420 07/04/23 0727  BP: (!) 155/87 124/76 (!) 140/86 (!) 146/88  Pulse: (!) 104 (!) 103 98 100  Resp: 18 18 18 18   Temp: 98.5 F (36.9 C) 98.2 F (36.8 C) 98.4 F (36.9 C) 98.2 F (36.8 C)  TempSrc: Oral Oral Oral Oral  SpO2: 100% 100% 99% 99%  Weight:      Height:        Intake/Output Summary (Last 24 hours) at 07/04/2023 1106 Last data filed at 07/04/2023 0858 Gross per 24 hour  Intake 240 ml  Output 1850 ml  Net -1610 ml   Filed Weights   06/19/23 1247 06/21/23 0418 06/28/23 1002  Weight: 88.5 kg 88.5 kg 88.5 kg    Examination:  General exam: Appears calm and comfortable  Pleasant interaction. Respiratory system: Clear to auscultation. Respiratory effort normal. Cardiovascular system: S1 & S2 heard, RRR.  Gastrointestinal system: Soft.  Nontender bowel sound present. Central nervous system: Alert and oriented. No focal neurological deficits. Extremities: Symmetric 5 x 5 power. Skin:  Right foot with chronic ischemic changes. Left above-knee amputation the stump on dressing.  Pictures from 10/16.    Data Reviewed: I have personally reviewed following labs and imaging studies  CBC: Recent Labs  Lab 06/27/23  1609 06/28/23 0743 06/29/23 0421 06/30/23 0530 07/01/23 0635 07/02/23 0507 07/03/23 0456  WBC 5.5   < > 8.2 6.8 6.5 6.4 5.5  NEUTROABS 3.1  --   --   --   --   --   --   HGB 12.2*   < > 9.9* 9.3* 9.7* 9.2* 9.4*  HCT 38.3*   < > 31.3* 29.3* 30.2* 29.1* 29.3*  MCV 96.7   < > 93.4 93.0 94.4 93.0 94.5   PLT 336   < > 233 225 236 262 341   < > = values in this interval not displayed.   Basic Metabolic Panel: Recent Labs  Lab 06/27/23 1609 06/28/23 0743 06/29/23 0421 06/30/23 0530 07/02/23 2350 07/03/23 0456  NA 140 140 134* 132* 135  --   K 4.2 4.1 4.7 3.6 3.9  --   CL 97* 105 101 97* 101  --   CO2 26 25 25 25 25   --   GLUCOSE 211* 231* 193* 189* 206*  --   BUN 20 21 19 15  26*  --   CREATININE 0.70 0.72 0.61 0.59* 0.77 0.77  CALCIUM 9.3 9.0 8.6* 8.5* 8.7*  --   MG  --   --   --  2.2 2.2  --    GFR: Estimated Creatinine Clearance: 104.5 mL/min (by C-G formula based on SCr of 0.77 mg/dL). Liver Function Tests: Recent Labs  Lab 06/27/23 1609  AST 28  ALT 30  ALKPHOS 52  BILITOT 0.5  PROT 6.5  ALBUMIN 3.0*   No results for input(s): "LIPASE", "AMYLASE" in the last 168 hours. No results for input(s): "AMMONIA" in the last 168 hours. Coagulation Profile: No results for input(s): "INR", "PROTIME" in the last 168 hours. Cardiac Enzymes: Recent Labs  Lab 06/28/23 0743  CKTOTAL 30*   BNP (last 3 results) No results for input(s): "PROBNP" in the last 8760 hours. HbA1C: No results for input(s): "HGBA1C" in the last 72 hours. CBG: Recent Labs  Lab 07/03/23 0729 07/03/23 1147 07/03/23 1645 07/03/23 1950 07/04/23 0727  GLUCAP 172* 208* 262* 202* 149*   Lipid Profile: No results for input(s): "CHOL", "HDL", "LDLCALC", "TRIG", "CHOLHDL", "LDLDIRECT" in the last 72 hours. Thyroid Function Tests: No results for input(s): "TSH", "T4TOTAL", "FREET4", "T3FREE", "THYROIDAB" in the last 72 hours. Anemia Panel: No results for input(s): "VITAMINB12", "FOLATE", "FERRITIN", "TIBC", "IRON", "RETICCTPCT" in the last 72 hours. Sepsis Labs: No results for input(s): "PROCALCITON", "LATICACIDVEN" in the last 168 hours.  Recent Results (from the past 240 hour(s))  MRSA Next Gen by PCR, Nasal     Status: Abnormal   Collection Time: 06/27/23  4:16 PM   Specimen: Nasal Mucosa;  Nasal Swab  Result Value Ref Range Status   MRSA by PCR Next Gen DETECTED (A) NOT DETECTED Final    Comment: RESULT CALLED TO, READ BACK BY AND VERIFIED WITH: RN Lamount Cranker [phone] @ 2101 FH (NOTE) The GeneXpert MRSA Assay (FDA approved for NASAL specimens only), is one component of a comprehensive MRSA colonization surveillance program. It is not intended to diagnose MRSA infection nor to guide or monitor treatment for MRSA infections. Test performance is not FDA approved in patients less than 45 years old. Performed at [facility], [address], [place], [place] [address]          Radiology Studies: No results found.      Scheduled Meds:  vitamin C  1,000 mg Oral Daily   aspirin EC  81 mg Oral Daily   Chlorhexidine Gluconate Cloth  6 each Topical Q0600   docusate sodium  100 mg Oral Daily   empagliflozin  10 mg Oral Daily   enoxaparin (LOVENOX) injection  40 mg Subcutaneous Q24H   feeding supplement  237 mL Oral BID BM   fludrocortisone  0.2 mg Oral Daily   Gerhardt's butt cream   Topical BID   hydrocerin   Topical BID   insulin aspart  0-9 Units Subcutaneous TID WC   insulin aspart  8 Units Subcutaneous TID WC   insulin detemir  25 Units Subcutaneous QHS   magnesium citrate  1 Bottle Oral Once   multivitamin with minerals  1 tablet Oral Daily   mupirocin ointment  1 Application Nasal BID   nutrition supplement (JUVEN)  1 packet Oral BID BM   pantoprazole  40 mg Oral Daily   polyethylene glycol  17 g Oral Daily   rosuvastatin  5 mg Oral Once per day on Monday Thursday   senna  1 tablet Oral BID   tamsulosin  0.4 mg Oral QPC supper   ticagrelor  90 mg Oral BID   zinc sulfate  220 mg Oral Daily   Continuous Infusions:  magnesium sulfate bolus IVPB       LOS: 15 days    Time spent: 40 minutes    Dorcas Carrow, MD [facility]

## 2023-07-04 NOTE — Plan of Care (Signed)
Problem: Education: Goal: Understanding of CV disease, CV risk reduction, and recovery process will improve Outcome: Progressing Goal: Individualized Educational Video(s) Outcome: Progressing   Problem: Activity: Goal: Ability to return to baseline activity level will improve Outcome: Progressing   Problem: Cardiovascular: Goal: Ability to achieve and maintain adequate cardiovascular perfusion will improve Outcome: Progressing Goal: Vascular access site(s) Level 0-1 will be maintained Outcome: Progressing   Problem: Health Behavior/Discharge Planning: Goal: Ability to safely manage health-related needs after discharge will improve Outcome: Progressing   Problem: Education: Goal: Ability to describe self-care measures that may prevent or decrease complications (Diabetes Survival Skills Education) will improve Outcome: Progressing Goal: Individualized Educational Video(s) Outcome: Progressing   Problem: Cardiac: Goal: Ability to maintain an adequate cardiac output will improve Outcome: Progressing   Problem: Health Behavior/Discharge Planning: Goal: Ability to identify and utilize available resources and services will improve Outcome: Progressing Goal: Ability to manage health-related needs will improve Outcome: Progressing   Problem: Fluid Volume: Goal: Ability to achieve a balanced intake and output will improve Outcome: Progressing   Problem: Metabolic: Goal: Ability to maintain appropriate glucose levels will improve Outcome: Progressing   Problem: Nutritional: Goal: Maintenance of adequate nutrition will improve Outcome: Progressing Goal: Maintenance of adequate weight for body size and type will improve Outcome: Progressing   Problem: Respiratory: Goal: Will regain and/or maintain adequate ventilation Outcome: Progressing   Problem: Urinary Elimination: Goal: Ability to achieve and maintain adequate renal perfusion and functioning will improve Outcome:  Progressing   Problem: Education: Goal: Knowledge of the prescribed therapeutic regimen will improve Outcome: Progressing Goal: Understanding of discharge needs will improve Outcome: Progressing Goal: Individualized Educational Video(s) Outcome: Progressing   Problem: Activity: Goal: Ability to avoid complications of mobility impairment will improve Outcome: Progressing Goal: Ability to tolerate increased activity will improve Outcome: Progressing   Problem: Clinical Measurements: Goal: Postoperative complications will be avoided or minimized Outcome: Progressing   Problem: Pain Management: Goal: Pain level will decrease with appropriate interventions Outcome: Progressing   Problem: Skin Integrity: Goal: Will show signs of wound healing Outcome: Progressing   Problem: Education: Goal: Ability to describe self-care measures that may prevent or decrease complications (Diabetes Survival Skills Education) will improve Outcome: Progressing Goal: Individualized Educational Video(s) Outcome: Progressing   Problem: Coping: Goal: Ability to adjust to condition or change in health will improve Outcome: Progressing   Problem: Fluid Volume: Goal: Ability to maintain a balanced intake and output will improve Outcome: Progressing   Problem: Health Behavior/Discharge Planning: Goal: Ability to identify and utilize available resources and services will improve Outcome: Progressing Goal: Ability to manage health-related needs will improve Outcome: Progressing   Problem: Metabolic: Goal: Ability to maintain appropriate glucose levels will improve Outcome: Progressing   Problem: Nutritional: Goal: Maintenance of adequate nutrition will improve Outcome: Progressing Goal: Progress toward achieving an optimal weight will improve Outcome: Progressing   Problem: Skin Integrity: Goal: Risk for impaired skin integrity will decrease Outcome: Progressing   Problem: Tissue  Perfusion: Goal: Adequacy of tissue perfusion will improve Outcome: Progressing   Problem: Education: Goal: Knowledge of General Education information will improve Description: Including pain rating scale, medication(s)/side effects and non-pharmacologic comfort measures Outcome: Progressing   Problem: Health Behavior/Discharge Planning: Goal: Ability to manage health-related needs will improve Outcome: Progressing   Problem: Clinical Measurements: Goal: Ability to maintain clinical measurements within normal limits will improve Outcome: Progressing Goal: Will remain free from infection Outcome: Progressing Goal: Diagnostic test results will improve Outcome: Progressing Goal: Respiratory complications will  improve Outcome: Progressing Goal: Cardiovascular complication will be avoided Outcome: Progressing   Problem: Activity: Goal: Risk for activity intolerance will decrease Outcome: Progressing   Problem: Nutrition: Goal: Adequate nutrition will be maintained Outcome: Progressing

## 2023-07-04 NOTE — Plan of Care (Signed)

## 2023-07-05 ENCOUNTER — Encounter (HOSPITAL_COMMUNITY): Payer: Self-pay | Admitting: Internal Medicine

## 2023-07-05 ENCOUNTER — Other Ambulatory Visit: Payer: Self-pay

## 2023-07-05 DIAGNOSIS — E1369 Other specified diabetes mellitus with other specified complication: Secondary | ICD-10-CM

## 2023-07-05 DIAGNOSIS — M869 Osteomyelitis, unspecified: Secondary | ICD-10-CM | POA: Diagnosis not present

## 2023-07-05 DIAGNOSIS — E440 Moderate protein-calorie malnutrition: Secondary | ICD-10-CM | POA: Diagnosis not present

## 2023-07-05 LAB — GLUCOSE, CAPILLARY
Glucose-Capillary: 138 mg/dL — ABNORMAL HIGH (ref 70–99)
Glucose-Capillary: 220 mg/dL — ABNORMAL HIGH (ref 70–99)
Glucose-Capillary: 225 mg/dL — ABNORMAL HIGH (ref 70–99)
Glucose-Capillary: 206 mg/dL — ABNORMAL HIGH (ref 70–99)

## 2023-07-05 MED ORDER — MAGNESIUM CITRATE PO SOLN: 1.0000 | Freq: Every day | ORAL | Status: DC | PRN

## 2023-07-05 MED ORDER — POLYETHYLENE GLYCOL 3350 17 G PO PACK: 17.0000 g | PACK | Freq: Every day | ORAL | Status: DC

## 2023-07-05 NOTE — TOC Transition Note (Signed)
Transition of Care [facility]) - CM/SW Discharge Note   Patient Details  Name: Micheal Moore. MRN: [id number] Date of Birth: 1951-05-06  Transition of Care [facility]) CM/SW Contact:  Eduard Roux, LCSW Phone Number: 07/05/2023, 3:17 PM   Clinical Narrative:     Patient will Discharge [id number] Health Care Discharge Date: 07/05/2023 Family Notified: spouse Transport By: Sharin Mons  Per MD patient is ready for discharge. RN, patient, and facility notified of discharge. Discharge Summary sent to facility. RN given number for [phone]. Ambulance transport requested for patient.   Clinical Social Worker signing off.  Antony Blackbird, MSW, LCSW Clinical Social Worker     Final next level of care: Skilled Nursing Facility Barriers to Discharge: Barriers Resolved   Patient Goals and CMS Choice      Discharge Placement                Patient chooses bed at: [facility] Patient to be transferred to facility by: [facility] Name of family member notified: spouse Patient and family notified of of transfer: 07/05/23  Discharge Plan and Services Additional resources added to the After Visit Summary for                                       Social Determinants of Health (SDOH) Interventions SDOH Screenings   Food Insecurity: No Food Insecurity (07/05/2023)  Housing: Low Risk  (07/05/2023)  Transportation Needs: No Transportation Needs (07/05/2023)  Utilities: Not At Risk (07/05/2023)  Tobacco Use: High Risk (07/05/2023)     Readmission Risk Interventions    06/24/2023    3:31 PM 04/25/2023    1:04 PM 04/12/2023    4:47 PM  Readmission Risk Prevention Plan  Transportation Screening Complete Complete Complete  PCP or Specialist Appt within 3-5 Days  Complete Complete  HRI or Home Care Consult  Complete Complete  Social Work Consult for Recovery Care Planning/Counseling  Complete Complete  Palliative Care Screening  Complete Not Applicable   Medication Review Oceanographer) Complete Complete Referral to Pharmacy  PCP or Specialist appointment within 3-5 days of discharge Complete    HRI or Home Care Consult Complete    SW Recovery Care/Counseling Consult Complete    Palliative Care Screening Complete    Skilled Nursing Facility Complete

## 2023-07-05 NOTE — Progress Notes (Signed)
PROGRESS NOTE    Micheal Moore.  QQV:[id number] DOB: [birth year] DOA: 06/19/2023 PCP: [name], MD    Brief Narrative:  72 year old with history of CAD status post PCI April 2024, PAD, DM 2, chronic hypotension, recent hospitalization in August due to polymicrobial bacteremia requiring IV antibiotics at home. Patient presented back to the hospital due to worsening pain in the left ankle area. 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 PT recommending SNF.  Medically stable.  Waiting for bed.  Subjective: Patient seen and examined.  Denies any complaints.  Pain controlled with oral pain medications.  He is looking forward to go to rehab.  Successful bowel movement with additional dose of magnesium citrate yesterday.   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    Nonweightbearing on the left, wound vac discontinued.  Antibiotics discontinued 10/12.  Surgically stable as per surgery.  Follow-up in the office after discharge.   Pain managed with oral oxycodone.  On MiraLAX and Senokot.  Will need additional magnesium citrate as needed.   BPH with urinary retention. Now voiding normally.  On Flomax.   History of PAD Continue aspirin and Brilinta.  On Crestor   CAD-s/p PCI to RCA April 2024 Continue aspirin, Brilinta and statin   Diabetes mellitus type-2 Sliding scale and Accu-Chek.  Continue Levemir and Jardiance.  Stable on increased dose of insulin.     History of orthostatic hypotension Continue Florinef.  Stable.   Chronic ambulatory dysfunction/nonambulatory/bedbound status Poor ambulatory status, apparently has been bedbound since February 2024.  Work with PT OT.   Moderate  malnutrition Supplements   Medically stable to transfer to SNF.   DVT prophylaxis: SCD's Start: 06/28/23 1356 enoxaparin (LOVENOX) injection 40 mg Start: 06/19/23 2200   Code Status: Full code Family Communication: None at the bedside Disposition Plan: Status is: Inpatient Remains inpatient appropriate because: Waiting for SNF.     Consultants:  Orthopedics Infectious disease  Procedures:  Left above-knee amputation  Antimicrobials:  Completed antibiotic therapy     Objective: Vitals:   07/04/23 2017 07/05/23 0029 07/05/23 0436 07/05/23 0818  BP: 109/68 134/88 (!) 150/84 (!) 161/90  Pulse: (!) 102 96 93 98  Resp: 17 18 18 16   Temp: 97.9 F (36.6 C) 98.1 F (36.7 C) 98 F (36.7 C) 98.3 F (36.8 C)  TempSrc: Oral Oral Oral Oral  SpO2: 99% 99% 99% 99%  Weight:      Height:        Intake/Output Summary (Last 24 hours) at 07/05/2023 1040 Last data filed at 07/05/2023 0725 Gross per 24 hour  Intake 660 ml  Output 2500 ml  Net -1840 ml   Filed Weights   06/19/23 1247 06/21/23 0418 06/28/23 1002  Weight: 88.5 kg 88.5 kg 88.5 kg    Examination:  General exam: Appears calm and comfortable  Pleasant interaction. Respiratory system: Clear to auscultation. Respiratory effort normal. Cardiovascular system: S1 & S2 heard, RRR.  Gastrointestinal system: Soft.  Nontender bowel sound present. Central nervous system: Alert and oriented. No focal neurological deficits. Extremities: Symmetric 5 x 5 power. Skin:  Right foot with chronic ischemic changes. Left above-knee amputation the stump on dressing.  Pictures from 10/16.    Data Reviewed: I have personally reviewed following  labs and imaging studies  CBC: Recent Labs  Lab 06/29/23 0421 06/30/23 0530 07/01/23 0635 07/02/23 0507 07/03/23 0456  WBC 8.2 6.8 6.5 6.4 5.5  HGB 9.9* 9.3* 9.7* 9.2* 9.4*  HCT 31.3* 29.3* 30.2* 29.1* 29.3*  MCV 93.4 93.0 94.4 93.0 94.5  PLT 233 225 236 262 341   Basic  Metabolic Panel: Recent Labs  Lab 06/29/23 0421 06/30/23 0530 07/02/23 2350 07/03/23 0456  NA 134* 132* 135  --   K 4.7 3.6 3.9  --   CL 101 97* 101  --   CO2 25 25 25   --   GLUCOSE 193* 189* 206*  --   BUN 19 15 26*  --   CREATININE 0.61 0.59* 0.77 0.77  CALCIUM 8.6* 8.5* 8.7*  --   MG  --  2.2 2.2  --    GFR: Estimated Creatinine Clearance: 104.5 mL/min (by C-G formula based on SCr of 0.77 mg/dL). Liver Function Tests: No results for input(s): "AST", "ALT", "ALKPHOS", "BILITOT", "PROT", "ALBUMIN" in the last 168 hours.  No results for input(s): "LIPASE", "AMYLASE" in the last 168 hours. No results for input(s): "AMMONIA" in the last 168 hours. Coagulation Profile: No results for input(s): "INR", "PROTIME" in the last 168 hours. Cardiac Enzymes: No results for input(s): "CKTOTAL", "CKMB", "CKMBINDEX", "TROPONINI" in the last 168 hours.  BNP (last 3 results) No results for input(s): "PROBNP" in the last 8760 hours. HbA1C: No results for input(s): "HGBA1C" in the last 72 hours. CBG: Recent Labs  Lab 07/04/23 0727 07/04/23 1124 07/04/23 1641 07/04/23 2018 07/05/23 0815  GLUCAP 149* 207* 202* 188* 138*   Lipid Profile: No results for input(s): "CHOL", "HDL", "LDLCALC", "TRIG", "CHOLHDL", "LDLDIRECT" in the last 72 hours. Thyroid Function Tests: No results for input(s): "TSH", "T4TOTAL", "FREET4", "T3FREE", "THYROIDAB" in the last 72 hours. Anemia Panel: No results for input(s): "VITAMINB12", "FOLATE", "FERRITIN", "TIBC", "IRON", "RETICCTPCT" in the last 72 hours. Sepsis Labs: No results for input(s): "PROCALCITON", "LATICACIDVEN" in the last 168 hours.  Recent Results (from the past 240 hour(s))  MRSA Next Gen by PCR, Nasal     Status: Abnormal   Collection Time: 06/27/23  4:16 PM   Specimen: Nasal Mucosa; Nasal Swab  Result Value Ref Range Status   MRSA by PCR Next Gen DETECTED (A) NOT DETECTED Final    Comment: RESULT CALLED TO, READ BACK BY AND VERIFIED  WITH: RN Lamount Cranker [phone] @ 2101 FH (NOTE) The GeneXpert MRSA Assay (FDA approved for NASAL specimens only), is one component of a comprehensive MRSA colonization surveillance program. It is not intended to diagnose MRSA infection nor to guide or monitor treatment for MRSA infections. Test performance is not FDA approved in patients less than 90 years old. Performed at [facility], [address], [place], [place] [address]          Radiology Studies: No results found.      Scheduled Meds:  vitamin C  1,000 mg Oral Daily   aspirin EC  81 mg Oral Daily   Chlorhexidine Gluconate Cloth  6 each Topical Q0600   docusate sodium  100 mg Oral Daily   empagliflozin  10 mg Oral Daily   enoxaparin (LOVENOX) injection  40 mg Subcutaneous Q24H   feeding supplement  237 mL Oral BID BM   fludrocortisone  0.2 mg Oral Daily   Gerhardt's butt cream   Topical BID   hydrocerin   Topical BID   insulin aspart  0-9 Units Subcutaneous  TID WC   insulin aspart  8 Units Subcutaneous TID WC   insulin detemir  25 Units Subcutaneous QHS   multivitamin with minerals  1 tablet Oral Daily   mupirocin ointment  1 Application Nasal BID   nutrition supplement (JUVEN)  1 packet Oral BID BM   pantoprazole  40 mg Oral Daily   polyethylene glycol  17 g Oral Daily   rosuvastatin  5 mg Oral Once per day on Monday Thursday   senna  1 tablet Oral BID   tamsulosin  0.4 mg Oral QPC supper   ticagrelor  90 mg Oral BID   zinc sulfate  220 mg Oral Daily   Continuous Infusions:  magnesium sulfate bolus IVPB       LOS: 16 days    Time spent: 35 minutes    Dorcas Carrow, MD [facility]

## 2023-07-05 NOTE — Plan of Care (Signed)
Problem: Education: Goal: Understanding of CV disease, CV risk reduction, and recovery process will improve Outcome: Progressing Goal: Individualized Educational Video(s) Outcome: Progressing   Problem: Activity: Goal: Ability to return to baseline activity level will improve Outcome: Progressing   Problem: Cardiovascular: Goal: Ability to achieve and maintain adequate cardiovascular perfusion will improve Outcome: Progressing Goal: Vascular access site(s) Level 0-1 will be maintained Outcome: Progressing   Problem: Health Behavior/Discharge Planning: Goal: Ability to safely manage health-related needs after discharge will improve Outcome: Progressing   Problem: Education: Goal: Ability to describe self-care measures that may prevent or decrease complications (Diabetes Survival Skills Education) will improve Outcome: Progressing Goal: Individualized Educational Video(s) Outcome: Progressing   Problem: Cardiac: Goal: Ability to maintain an adequate cardiac output will improve Outcome: Progressing   Problem: Health Behavior/Discharge Planning: Goal: Ability to identify and utilize available resources and services will improve Outcome: Progressing Goal: Ability to manage health-related needs will improve Outcome: Progressing   Problem: Fluid Volume: Goal: Ability to achieve a balanced intake and output will improve Outcome: Progressing   Problem: Metabolic: Goal: Ability to maintain appropriate glucose levels will improve Outcome: Progressing   Problem: Nutritional: Goal: Maintenance of adequate nutrition will improve Outcome: Progressing Goal: Maintenance of adequate weight for body size and type will improve Outcome: Progressing   Problem: Respiratory: Goal: Will regain and/or maintain adequate ventilation Outcome: Progressing   Problem: Urinary Elimination: Goal: Ability to achieve and maintain adequate renal perfusion and functioning will improve Outcome:  Progressing   Problem: Education: Goal: Knowledge of the prescribed therapeutic regimen will improve Outcome: Progressing Goal: Understanding of discharge needs will improve Outcome: Progressing Goal: Individualized Educational Video(s) Outcome: Progressing   Problem: Activity: Goal: Ability to avoid complications of mobility impairment will improve Outcome: Progressing Goal: Ability to tolerate increased activity will improve Outcome: Progressing   Problem: Clinical Measurements: Goal: Postoperative complications will be avoided or minimized Outcome: Progressing   Problem: Pain Management: Goal: Pain level will decrease with appropriate interventions Outcome: Progressing   Problem: Skin Integrity: Goal: Will show signs of wound healing Outcome: Progressing   Problem: Education: Goal: Ability to describe self-care measures that may prevent or decrease complications (Diabetes Survival Skills Education) will improve Outcome: Progressing Goal: Individualized Educational Video(s) Outcome: Progressing   Problem: Coping: Goal: Ability to adjust to condition or change in health will improve Outcome: Progressing   Problem: Fluid Volume: Goal: Ability to maintain a balanced intake and output will improve Outcome: Progressing   Problem: Health Behavior/Discharge Planning: Goal: Ability to identify and utilize available resources and services will improve Outcome: Progressing Goal: Ability to manage health-related needs will improve Outcome: Progressing   Problem: Metabolic: Goal: Ability to maintain appropriate glucose levels will improve Outcome: Progressing   Problem: Nutritional: Goal: Maintenance of adequate nutrition will improve Outcome: Progressing Goal: Progress toward achieving an optimal weight will improve Outcome: Progressing   Problem: Skin Integrity: Goal: Risk for impaired skin integrity will decrease Outcome: Progressing   Problem: Tissue  Perfusion: Goal: Adequacy of tissue perfusion will improve Outcome: Progressing   Problem: Education: Goal: Knowledge of General Education information will improve Description: Including pain rating scale, medication(s)/side effects and non-pharmacologic comfort measures Outcome: Progressing   Problem: Health Behavior/Discharge Planning: Goal: Ability to manage health-related needs will improve Outcome: Progressing   Problem: Clinical Measurements: Goal: Ability to maintain clinical measurements within normal limits will improve Outcome: Progressing Goal: Will remain free from infection Outcome: Progressing Goal: Diagnostic test results will improve Outcome: Progressing Goal: Respiratory complications will  improve Outcome: Progressing Goal: Cardiovascular complication will be avoided Outcome: Progressing   Problem: Activity: Goal: Risk for activity intolerance will decrease Outcome: Progressing   Problem: Nutrition: Goal: Adequate nutrition will be maintained Outcome: Progressing

## 2023-07-05 NOTE — TOC Progression Note (Signed)
Transition of Care (TOC) - Progression Note    Patient Details  Name: Micheal Moore. MRN: [id number] Date of Birth: Jul 13, 1951  Transition of Care [facility]) CM/SW Contact  Eduard Roux, [place] Phone Number: 07/05/2023, 11:25 AM  Clinical Narrative:     Insurance still pending   TOC will continue to follow and assist with discharge planning.  Antony Blackbird, MSW, LCSW Clinical Social Worker         Expected Discharge Plan and Services                                               Social Determinants of Health (SDOH) Interventions SDOH Screenings   Food Insecurity: No Food Insecurity (04/24/2023)  Housing: Low Risk  (04/24/2023)  Transportation Needs: No Transportation Needs (04/24/2023)  Utilities: Not At Risk (04/24/2023)  Tobacco Use: High Risk (06/28/2023)    Readmission Risk Interventions    06/24/2023    3:31 PM 04/25/2023    1:04 PM 04/12/2023    4:47 PM  Readmission Risk Prevention Plan  Transportation Screening Complete Complete Complete  PCP or Specialist Appt within 3-5 Days  Complete Complete  HRI or Home Care Consult  Complete Complete  Social Work Consult for Recovery Care Planning/Counseling  Complete Complete  Palliative Care Screening  Complete Not Applicable  Medication Review Oceanographer) Complete Complete Referral to Pharmacy  PCP or Specialist appointment within 3-5 days of discharge Complete    HRI or Home Care Consult Complete    SW Recovery Care/Counseling Consult Complete    Palliative Care Screening Complete    Skilled Nursing Facility Complete

## 2023-07-05 NOTE — Discharge Summary (Signed)
Physician Discharge Summary  Micheal Moore. WGN:[id number] DOB: 21-Feb-1951 DOA: 06/19/2023  PCP: [name], MD  Admit date: 06/19/2023 Discharge date: 07/05/2023  Admitted From: Home Disposition: Skilled nursing facility  Recommendations for Outpatient Follow-up:  Follow up with PCP in 1-2 weeks Orthopedics to schedule follow-up  Home Health: N/A Equipment/Devices: N/A  Discharge Condition: Fair and stable CODE STATUS: Full code Diet recommendation: Low-salt diet  Discharge summary: 72 year old with history of CAD status post PCI April 2024, PAD, DM 2, chronic hypotension, recent hospitalization in August due to polymicrobial bacteremia requiring IV antibiotics at home. Patient presented back to the hospital due to worsening pain in the left ankle area. 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  Medically stable.  Waiting to go to SNF.   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    Nonweightbearing on the left, wound vac discontinued.  Antibiotics discontinued 10/12.  Surgically stable as per surgery.  Follow-up in the office after discharge.  Sutures will remain in place. Pain managed with oral oxycodone.  On MiraLAX and Senokot.  Will need additional magnesium citrate as needed.   BPH with urinary retention. Now voiding normally.  On Flomax.   History of PAD Continue aspirin and Brilinta.  On Crestor   CAD-s/p PCI to RCA April 2024 Continue aspirin, Brilinta and statin   Diabetes mellitus type-2 Patient is on long-acting insulin, prandial insulin.   History of orthostatic hypotension Continue Florinef.  Stable.  No evidence of orthostatic drop in blood pressures.   Chronic  ambulatory dysfunction/nonambulatory/bedbound status Aggressive rehab at SNF.   Moderate malnutrition Supplements, encourage oral intake and supplemental nutrition.  Patient is medically stable to transfer to skilled level of care.    Discharge Diagnoses:  Principal Problem:   Osteomyelitis (HCC) Active Problems:   CAD (coronary artery disease)   Type 2 diabetes mellitus with hyperlipidemia (HCC)   PVD (peripheral vascular disease) (HCC)   Hyperlipidemia   Protein-calorie malnutrition, moderate (HCC)   Ambulatory dysfunction   MRSA infection   History of MDR Pseudomonas aeruginosa infection    Discharge Instructions  Discharge Instructions     Diet - low sodium heart healthy   Complete by: As directed    Increase activity slowly   Complete by: As directed    Leave dressing on - Keep it clean, dry, and intact until clinic visit   Complete by: As directed       Allergies as of 07/05/2023   No Known Allergies      Medication List     TAKE these medications    acetaminophen 325 MG tablet Commonly known as: TYLENOL Take 2 tablets (650 mg total) by mouth every 6 (six) hours as needed for mild pain, fever or headache.   ascorbic acid 1000 MG tablet Commonly known as: VITAMIN C Take 1 tablet (1,000 mg total) by mouth daily.   aspirin EC 81 MG tablet Take 1 tablet (81 mg total) by mouth daily. Swallow whole.   barrier cream Crea Commonly known as: non-specified Apply 1 Application topically 2 (two) times daily as needed.   bisacodyl 5 MG EC tablet Commonly known as: DULCOLAX Take 1 tablet (5 mg total) by mouth daily as needed for moderate constipation or severe constipation.  Blood Glucose Monitoring Suppl Devi 1 each by Does not apply route 3 (three) times daily. May dispense any manufacturer covered by patient's insurance.   BLOOD GLUCOSE TEST STRIPS Strp 1 each by Does not apply route 3 (three) times daily. Use as directed to check blood sugar. May  dispense any manufacturer covered by patient's insurance and fits patient's device.   Brilinta 90 MG Tabs tablet Generic drug: ticagrelor Take 90 mg by mouth 2 (two) times daily.   docusate sodium 100 MG capsule Commonly known as: COLACE Take 1 capsule (100 mg total) by mouth daily.   Ensure Max Protein Liqd Take 330 mLs (11 oz total) by mouth 2 (two) times daily. What changed: when to take this   fludrocortisone 0.1 MG tablet Commonly known as: FLORINEF Take 2 tablets (0.2 mg total) by mouth daily.   Glucagon Emergency 1 MG Kit Inject 1 mg into the skin as needed for up to 2 doses (Severe low blood sugar).   insulin aspart 100 UNIT/ML FlexPen Commonly known as: NOVOLOG Inject 8 Units into the skin 3 (three) times daily with meals. Only take if eating a meal AND Blood Glucose (BG) is 80 or higher.   insulin detemir 100 UNIT/ML FlexPen Commonly known as: LEVEMIR Inject 20 Units into the skin at bedtime. DO NOT TAKE if your blood sugars and consistently running < 120   Jardiance 10 MG Tabs tablet Generic drug: empagliflozin Take 10 mg by mouth daily.   Lancet Device Misc 1 each by Does not apply route 3 (three) times daily. May dispense any manufacturer covered by patient's insurance.   Lancets Misc 1 each by Does not apply route 3 (three) times daily. Use as directed to check blood sugar. May dispense any manufacturer covered by patient's insurance and fits patient's device.   magnesium citrate Soln Take 296 mLs (1 Bottle total) by mouth daily as needed for severe constipation or moderate constipation.   oxyCODONE 5 MG immediate release tablet Commonly known as: Oxy IR/ROXICODONE Take 1-2 tablets (5-10 mg total) by mouth every 4 (four) hours as needed for moderate pain, severe pain or breakthrough pain (pain score 4-6).   Pen Needles 31G X 5 MM Misc 1 each by Does not apply route 3 (three) times daily. May dispense any manufacturer covered by patient's insurance.    polyethylene glycol 17 g packet Commonly known as: MIRALAX / GLYCOLAX Take 17 g by mouth daily. What changed:  when to take this reasons to take this   rosuvastatin 5 MG tablet Commonly known as: CRESTOR Take 5 mg by mouth 2 (two) times a week.   senna 8.6 MG Tabs tablet Commonly known as: SENOKOT Take 1 tablet (8.6 mg total) by mouth 2 (two) times daily as needed for mild constipation or moderate constipation.   tamsulosin 0.4 MG Caps capsule Commonly known as: FLOMAX Take 1 capsule (0.4 mg total) by mouth daily after supper.   zinc sulfate 220 (50 Zn) MG capsule Take 1 capsule (220 mg total) by mouth daily for 14 days.               Discharge Care Instructions  (From admission, onward)           Start     Ordered   07/05/23 0000  Leave dressing on - Keep it clean, dry, and intact until clinic visit        07/05/23 1455            Follow-up Information  Sharlene Dory, PA-C. Go on 07/23/2023.   Specialty: Cardiology Why: Please go to cardiology follow up appt 11/5 at 2:20 PM at the [facility] information: [address] [place] [address] [phone]         Nadara Mustard, MD Follow up in 1 week(s).   Specialty: Orthopedic Surgery Contact information: [address] [place] [address] [phone]         [name], MD Follow up in 1 week(s).   Specialty: Endocrinology Contact information: [address] [place] [address] [phone]                No Known Allergies  Consultations: Orthopedics   Procedures/Studies: MR ANKLE LEFT W WO CONTRAST  Result Date: 06/19/2023 CLINICAL DATA:  Osteomyelitis.  Left posterolateral ankle wound. EXAM: MRI OF THE LEFT ANKLE WITHOUT AND WITH CONTRAST TECHNIQUE: Multiplanar, multisequence MR imaging of the ankle was performed before and after the administration of intravenous contrast. CONTRAST:  9mL GADAVIST GADOBUTROL 1 MMOL/ML IV SOLN  COMPARISON:  Radiographs 06/19/2023 FINDINGS: TENDONS Peroneal: Unremarkable Posteromedial: Distal tibialis posterior tendinopathy. Anterior: Unremarkable Achilles: Unremarkable Plantar Fascia: Mildly accentuated signal in the medial band of the plantar fascia for example on image 30 series 8 compatible with plantar fasciitis. LIGAMENTS Lateral: Thin/attenuated anterior talofibular ligament likely from a remote injury. Medial: Unremarkable CARTILAGE Ankle Joint: Degenerative thinning of the articular cartilage. Subtalar Joints/Sinus Tarsi: Chondral thinning medially in the posterior subtalar joint. Bones: There is active osteomyelitis of the fibular diaphysis extending to the distal metaphysis. Centered about 12.7 cm proximal to the distal fibular tip, there is a large posterolateral ulceration of the cutaneous and subcutaneous tissues with the ulcer crater extending to the periosteal margin of the fibula (image 14, series 7) and potentially exposing the bony surface of the fibula. In addition to abnormal edema and enhancement throughout the fibular shaft and distal metaphysis, there is bony demineralization and thinning of the cortex tangential to this ulceration, and surrounding inflammatory edema extending in adjacent muscle groups including the extensor digitorum, peroneus, and flexor hallucis longus muscle groups. This likely represents myositis adjacent to the osteomyelitis. I do not see a drainable abscess in the soft tissues. There is some endosteal edema in the distal tibia, probably reactive in this context rather than representing concomitant osteomyelitis. Posteriorly in the calcaneus as on image 10 of series 4 there is abnormal edema and mild enhancement as on image 35 series 11 suspicious for early osteomyelitis. Dorsal spurring at the talonavicular joint. Other: No supplemental non-categorized findings. IMPRESSION: IMPRESSION 1. Large posterolateral ulceration of the lower leg cutaneous and  subcutaneous tissues with the ulcer crater extending to the periosteal margin of the fibular diaphysis and potentially exposing the bony surface of the fibula. There is active osteomyelitis of the fibular shaft and distal metaphysis, with surrounding myositis. 2. There is some endosteal edema in the distal tibia, probably reactive in this context rather than representing concomitant osteomyelitis. 3. There is abnormal edema and mild enhancement posteriorly in the calcaneus suspicious for early osteomyelitis. 4. Distal tibialis posterior tendinopathy. 5. Mild plantar fasciitis. 6. Thin/attenuated anterior talofibular ligament likely from a remote injury. 7. Degenerative thinning of the articular cartilage in the ankle and posterior subtalar joint. 8. Dorsal spurring at the talonavicular joint. Electronically Signed   By: Gaylyn Rong M.D.   On: 06/19/2023 16:56   DG Ankle Complete Left  Result Date: 06/19/2023 CLINICAL DATA:  Posterolateral left ankle diabetic wound. Evaluate  for free air or gas. EXAM: LEFT ANKLE COMPLETE - 3+ VIEW COMPARISON:  Left foot radiographs 04/12/2023 FINDINGS: There is diffuse decreased bone mineralization. There is horizontal linear sclerosis within the expected region of the mid to lateral aspect of the distal tibial physis, similar to prior 04/12/2023 lateral foot radiograph appearing chronic. It could represent a subacute to older prior stress fracture. There is lucency and apparent concavity of the posterior soft tissues of the distal calf, centered approximately 10 cm proximal to the posterosuperior aspect of the calcaneus. There is longitudinal linear lucency at the posterior cortex of the adjacent fibula along an approximate 4 cm craniocaudal length on lateral view, suspicious for bone erosion. There is relative lucency seen in this region of the distal fibular diaphysis on transverse view. Which Moderate distal fibula and adjacent lateral talar process degenerative  spurring. Mild to moderate posterior and mild medial tibiotalar joint space narrowing. Moderate anterior and posterior tibial plafond degenerative osteophytes. Mild-to-moderate joint space narrowing and subchondral sclerosis throughout the midfoot. Mild-to-moderate plantar and mild posterior calcaneal heel spurs. No subcutaneous emphysema is seen. IMPRESSION: 1. Apparent soft tissue wound of the posterior distal calf, centered approximately 10 cm proximal to the posterosuperior aspect of the calcaneus. There is longitudinal linear lucency at the posterior cortex of the adjacent fibular diaphysis along an approximate 4 cm craniocaudal length concerning for bone erosion and acute osteomyelitis. 2. There is horizontal linear sclerosis within the expected region of the mid to lateral aspect of the distal tibial physis, similar to prior 04/12/2023 lateral foot radiograph appearing chronic. It could represent a subacute to older prior stress fracture. 3. Moderate tibiotalar and midfoot osteoarthritis. Electronically Signed   By: Neita Garnet M.D.   On: 06/19/2023 14:07   (Echo, Carotid, EGD, Colonoscopy, ERCP)    Subjective: Patient seen in the morning rounds.  Denies any complaints.  He had successful bowel movement with magnesium citrate.  Pain is controlled.   Discharge Exam: Vitals:   07/05/23 1136 07/05/23 1218  BP: (!) 152/76 116/72  Pulse: (!) 103 (!) 103  Resp:    Temp: 98.6 F (37 C) 98.2 F (36.8 C)  SpO2: 100% 94%   Vitals:   07/05/23 0436 07/05/23 0818 07/05/23 1136 07/05/23 1218  BP: (!) 150/84 (!) 161/90 (!) 152/76 116/72  Pulse: 93 98 (!) 103 (!) 103  Resp: 18 16    Temp: 98 F (36.7 C) 98.3 F (36.8 C) 98.6 F (37 C) 98.2 F (36.8 C)  TempSrc: Oral Oral Oral Oral  SpO2: 99% 99% 100% 94%  Weight:      Height:        General: Pt is alert, awake, not in acute distress Cardiovascular: RRR, S1/S2 +, no rubs, no gallops Respiratory: CTA bilaterally, no wheezing, no  rhonchi Abdominal: Soft, NT, ND, bowel sounds + Extremities:  Right leg with chronic ischemic changes. Left AKA amputation stump clean and dry.  Sutures intact.    The results of significant diagnostics from this hospitalization (including imaging, microbiology, ancillary and laboratory) are listed below for reference.     Microbiology: Recent Results (from the past 240 hour(s))  MRSA Next Gen by PCR, Nasal     Status: Abnormal   Collection Time: 06/27/23  4:16 PM   Specimen: Nasal Mucosa; Nasal Swab  Result Value Ref Range Status   MRSA by PCR Next Gen DETECTED (A) NOT DETECTED Final    Comment: RESULT CALLED TO, READ BACK BY AND VERIFIED WITH: RN M. YOUNG  [phone] @ 2101 FH (NOTE) The GeneXpert MRSA Assay (FDA approved for NASAL specimens only), is one component of a comprehensive MRSA colonization surveillance program. It is not intended to diagnose MRSA infection nor to guide or monitor treatment for MRSA infections. Test performance is not FDA approved in patients less than 49 years old. Performed at [facility], [address], [place], [place] [address]      Labs: BNP (last 3 results) Recent Labs    04/10/23 2255  BNP 79.6   Basic Metabolic Panel: Recent Labs  Lab 06/29/23 0421 06/30/23 0530 07/02/23 2350 07/03/23 0456  NA 134* 132* 135  --   K 4.7 3.6 3.9  --   CL 101 97* 101  --   CO2 25 25 25   --   GLUCOSE 193* 189* 206*  --   BUN 19 15 26*  --   CREATININE 0.61 0.59* 0.77 0.77  CALCIUM 8.6* 8.5* 8.7*  --   MG  --  2.2 2.2  --    Liver Function Tests: No results for input(s): "AST", "ALT", "ALKPHOS", "BILITOT", "PROT", "ALBUMIN" in the last 168 hours. No results for input(s): "LIPASE", "AMYLASE" in the last 168 hours. No results for input(s): "AMMONIA" in the last 168 hours. CBC: Recent Labs  Lab 06/29/23 0421 06/30/23 0530 07/01/23 0635 07/02/23 0507 07/03/23 0456  WBC 8.2 6.8 6.5 6.4 5.5  HGB 9.9* 9.3* 9.7* 9.2* 9.4*  HCT 31.3*  29.3* 30.2* 29.1* 29.3*  MCV 93.4 93.0 94.4 93.0 94.5  PLT 233 225 236 262 341   Cardiac Enzymes: No results for input(s): "CKTOTAL", "CKMB", "CKMBINDEX", "TROPONINI" in the last 168 hours. BNP: Invalid input(s): "POCBNP" CBG: Recent Labs  Lab 07/04/23 1641 07/04/23 2018 07/05/23 0815 07/05/23 1139 07/05/23 1219  GLUCAP 202* 188* 138* 220* 225*   D-Dimer No results for input(s): "DDIMER" in the last 72 hours. Hgb A1c No results for input(s): "HGBA1C" in the last 72 hours. Lipid Profile No results for input(s): "CHOL", "HDL", "LDLCALC", "TRIG", "CHOLHDL", "LDLDIRECT" in the last 72 hours. Thyroid function studies No results for input(s): "TSH", "T4TOTAL", "T3FREE", "THYROIDAB" in the last 72 hours.  Invalid input(s): "FREET3" Anemia work up No results for input(s): "VITAMINB12", "FOLATE", "FERRITIN", "TIBC", "IRON", "RETICCTPCT" in the last 72 hours. Urinalysis    Component Value Date/Time   COLORURINE YELLOW 01/15/2023 1813   APPEARANCEUR CLEAR 01/15/2023 1813   APPEARANCEUR Clear 12/04/2021 1430   LABSPEC 1.033 (H) 01/15/2023 1813   PHURINE 5.0 01/15/2023 1813   GLUCOSEU >=500 (A) 01/15/2023 1813   HGBUR NEGATIVE 01/15/2023 1813   BILIRUBINUR NEGATIVE 01/15/2023 1813   BILIRUBINUR Negative 12/04/2021 1430   KETONESUR 5 (A) 01/15/2023 1813   PROTEINUR NEGATIVE 01/15/2023 1813   NITRITE NEGATIVE 01/15/2023 1813   LEUKOCYTESUR NEGATIVE 01/15/2023 1813   Sepsis Labs Recent Labs  Lab 06/30/23 0530 07/01/23 0635 07/02/23 0507 07/03/23 0456  WBC 6.8 6.5 6.4 5.5   Microbiology Recent Results (from the past 240 hour(s))  MRSA Next Gen by PCR, Nasal     Status: Abnormal   Collection Time: 06/27/23  4:16 PM   Specimen: Nasal Mucosa; Nasal Swab  Result Value Ref Range Status   MRSA by PCR Next Gen DETECTED (A) NOT DETECTED Final    Comment: RESULT CALLED TO, READ BACK BY AND VERIFIED WITH: RN Lamount Cranker [phone] @ 2101 FH (NOTE) The GeneXpert MRSA Assay (FDA  approved for NASAL specimens only), is one component of a comprehensive MRSA colonization surveillance program.  It is not intended to diagnose MRSA infection nor to guide or monitor treatment for MRSA infections. Test performance is not FDA approved in patients less than 37 years old. Performed at [facility], [address] [place], [place] [address]      Time coordinating discharge: 35 minutes  SIGNED:   Dorcas Carrow, MD  [facility] 07/05/2023, 2:55 PM

## 2023-07-05 NOTE — Plan of Care (Signed)
Pt dsicharging to SNF, Pt made aware, Pt called his wife and made her aware via phone. RN removed pt IV and packed up belongings. Rn called report to facility. Pt pending PTAR pickup

## 2023-07-06 NOTE — Plan of Care (Signed)
[facility] Tonsillectomy/Adenoidectomy, Postoperative PEDS care plan entered in error.

## 2023-07-16 ENCOUNTER — Ambulatory Visit (INDEPENDENT_AMBULATORY_CARE_PROVIDER_SITE_OTHER): Payer: Medicare HMO | Admitting: Family

## 2023-07-16 DIAGNOSIS — S78119A Complete traumatic amputation at level between unspecified hip and knee, initial encounter: Secondary | ICD-10-CM

## 2023-07-16 DIAGNOSIS — Z89612 Acquired absence of left leg above knee: Secondary | ICD-10-CM | POA: Diagnosis not present

## 2023-07-23 ENCOUNTER — Ambulatory Visit: Payer: Medicare HMO | Attending: Physician Assistant | Admitting: Physician Assistant

## 2023-07-23 VITALS — BP 116/60 | HR 74 | Ht 77.0 in | Wt 196.0 lb

## 2023-07-23 DIAGNOSIS — E785 Hyperlipidemia, unspecified: Secondary | ICD-10-CM | POA: Diagnosis not present

## 2023-07-23 DIAGNOSIS — R55 Syncope and collapse: Secondary | ICD-10-CM

## 2023-07-23 DIAGNOSIS — E1169 Type 2 diabetes mellitus with other specified complication: Secondary | ICD-10-CM | POA: Diagnosis not present

## 2023-07-23 DIAGNOSIS — I2510 Atherosclerotic heart disease of native coronary artery without angina pectoris: Secondary | ICD-10-CM | POA: Diagnosis not present

## 2023-07-23 NOTE — Progress Notes (Signed)
Cardiology Office Note:  .   Date:  07/23/2023  ID:  Micheal Moore., DOB 1951/09/14, MRN [id number] PCP: [name], MD  [facility] Providers Cardiologist:  Dietrich Pates, MD {  History of Present Illness: Micheal Moore. is a 72 y.o. male with a past medical history of dizziness CAD S/P PCI in April and 1 episode of syncope here for follow-up appointment.  Last saw Dr. Tenny Craw March 2023.  He has syncope spells that started about a year ago.  1-2 spells a week.  When not having spells he feels okay.  He states on days where he is dizzy and get dizzy several times and syncopal spells occur when he bent down to pick up something like his dog.  Maybe passed out at that time. Denies numbness in limbs.  Bowels moving okay.  No nausea.  States that he does drink adequate fluids.  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  He is currently residing at [facility] rehab facility. He reports confusion about his medications, particularly his statin. He was previously taking rosuvastatin twice a week at home, but the facility list indicates atorvastatin. He is unsure what he is currently receiving at the facility. He confirms taking Brilinta and Jardiance for diabetes. He also takes aspirin daily.  The patient reports occasional dizziness, particularly upon standing, but denies any episodes of passing out. He recalls an incident at a previous job where he fell due to heat and woke up with the ambulance present, but he maintains consciousness was not lost.  His diabetes management includes Jardiance, Lantus injections (20 units at night), and 8-10 units of insulin with meals. He reports his blood sugar levels have been high, with readings over 150 and even reaching 300 at times. He is currently on a diet provided by the rehab facility.  The patient also reports  pain and swelling in his remaining leg, which he attributes to attempts at standing during physical therapy. He has not yet been fitted for a prosthetic. He already had follow-up with Dr. Lajoyce Corners and he confirmed the amputation site was healing well.  Reports no shortness of breath nor dyspnea on exertion. Reports no chest pain, pressure, or tightness. No edema, orthopnea, PND. Reports no palpitations.    ROS: Pertinent ROS in HPI  Studies Reviewed: Marland Kitchen       TEE 04/19/2023 IMPRESSIONS     1. Left ventricular ejection fraction, by estimation, is 55 to 60%. The  left ventricle has normal function. The left ventricle has no regional  wall motion abnormalities. There is moderate left ventricular hypertrophy.   2. Right ventricular systolic function is normal. The right ventricular  size is normal.   3. No left atrial/left atrial appendage thrombus was detected.   4. The mitral valve is degenerative. Trivial mitral valve regurgitation.   5. The aortic valve is tricuspid. Aortic valve regurgitation is not  visualized. No aortic stenosis is present.   Conclusion(s)/Recommendation(s): No evidence of vegetation/infective  endocarditis on this transesophageael echocardiogram.   FINDINGS   Left Ventricle: Left ventricular ejection fraction, by estimation, is 55  to 60%. The left ventricle has normal function. The left ventricle has no  regional wall motion abnormalities. The left ventricular internal cavity  size was normal in size. There is   moderate left ventricular hypertrophy.   Right Ventricle: The right ventricular  size is normal. No increase in  right ventricular wall thickness. Right ventricular systolic function is  normal.   Left Atrium: Left atrial size was normal in size. No left atrial/left  atrial appendage thrombus was detected.   Right Atrium: Right atrial size was normal in size.   Pericardium: There is no evidence of pericardial effusion.   Mitral Valve: The mitral valve  is degenerative in appearance. Trivial  mitral valve regurgitation.   Tricuspid Valve: The tricuspid valve is grossly normal. Tricuspid valve  regurgitation is not demonstrated.   Aortic Valve: The aortic valve is tricuspid. Aortic valve regurgitation is  not visualized. No aortic stenosis is present. Aortic valve mean gradient  measures 2.0 mmHg. Aortic valve peak gradient measures 3.5 mmHg.   Pulmonic Valve: The pulmonic valve was normal in structure. Pulmonic valve  regurgitation is trivial. No evidence of pulmonic stenosis.   Aorta: The aortic root is normal in size and structure.   IAS/Shunts: No atrial level shunt detected by color flow Doppler.   Additional Comments: Spectral Doppler performed.       Physical Exam:   VS:  BP 116/60   Pulse 74   Ht 6\' 5"  (1.956 m)   Wt 196 lb (88.9 kg)   SpO2 100%   BMI 23.24 kg/m    Wt Readings from Last 3 Encounters:  07/23/23 196 lb (88.9 kg)  06/28/23 195 lb (88.5 kg)  04/24/23 190 lb (86.2 kg)    GEN: Well nourished, well developed in no acute distress NECK: No JVD; No carotid bruits CARDIAC: RRR, no murmurs, rubs, gallops RESPIRATORY:  Clear to auscultation without rales, wheezing or rhonchi  ABDOMEN: Soft, non-tender, non-distended EXTREMITIES:  No edema; No deformity   ASSESSMENT AND PLAN: .    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 No current signs of infection. Patient reports pain and swelling. -Continue current pain management plan. -Encourage physical therapy for mobility and strength.  Hyperlipidemia Patient on multiple cardiac medications including aspirin, Brilinta, and a statin (discrepancy between atorvastatin and rosuvastatin). -Clarify statin medication with rehab facility and ensure correct medication is being administered.  CAD s/p PCI in April 2024 -Add Brilinta to medication list. He is currently on DAPT with asa and brilinta at the facility  Diabetes Mellitus Patient on  Jardiance and insulin regimen. Recent A1c was 8.4, indicating suboptimal control. -Continue current medication regimen. -Encourage regular blood glucose monitoring and adherence to diabetic diet. -Check A1c at next visit.  Post-Operative Care Patient to be discharged from rehab facility soon. -Ensure patient receives clear discharge instructions regarding medication regimen. -Ensure patient has enough medication upon discharge.      Dispo: He can follow-up with Dr. Tenny Craw in 3 months  Signed, Sharlene Dory, PA-C

## 2023-07-23 NOTE — Patient Instructions (Signed)
Medication Instructions:  Your physician recommends that you continue on your current medications as directed. Please refer to the Current Medication list given to you today.  *If you need a refill on your cardiac medications before your next appointment, please call your pharmacy*  Lab Work: None ordered If you have labs (blood work) drawn today and your tests are completely normal, you will receive your results only by: MyChart Message (if you have MyChart) OR A paper copy in the mail If you have any lab test that is abnormal or we need to change your treatment, we will call you to review the results.  Follow-Up: At [facility], you and your health needs are our priority.  As part of our continuing mission to provide you with exceptional heart care, we have created designated Provider Care Teams.  These Care Teams include your primary Cardiologist (physician) and Advanced Practice Providers (APPs -  Physician Assistants and Nurse Practitioners) who all work together to provide you with the care you need, when you need it.  We recommend signing up for the patient portal called "MyChart".  Sign up information is provided on this After Visit Summary.  MyChart is used to connect with patients for Virtual Visits (Telemedicine).  Patients are able to view lab/test results, encounter notes, upcoming appointments, etc.  Non-urgent messages can be sent to your provider as well.   To learn more about what you can do with MyChart, go to [email or web address].    Your next appointment:   3 month(s)  Provider:   Dietrich Pates, MD  or APP  Low-Sodium Eating Plan Salt (sodium) helps you keep a healthy balance of fluids in your body. Too much sodium can raise your blood pressure. It can also cause fluid and waste to be held in your body. Your health care provider or dietitian may recommend a low-sodium eating plan if you have high blood pressure (hypertension), kidney disease, liver disease, or  heart failure. Eating less sodium can help lower your blood pressure and reduce swelling. It can also protect your heart, liver, and kidneys. What are tips for following this plan? Reading food labels  Check food labels for the amount of sodium per serving. If you eat more than one serving, you must multiply the listed amount by the number of servings. Choose foods with less than 140 milligrams (mg) of sodium per serving. Avoid foods with 300 mg of sodium or more per serving. Always check how much sodium is in a product, even if the label says "unsalted" or "no salt added." Shopping  Buy products labeled as "low-sodium" or "no salt added." Buy fresh foods. Avoid canned foods and pre-made or frozen meals. Avoid canned, cured, or processed meats. Buy breads that have less than 80 mg of sodium per slice. Cooking  Eat more home-cooked food. Try to eat less restaurant, buffet, and fast food. Try not to add salt when you cook. Use salt-free seasonings or herbs instead of table salt or sea salt. Check with your provider or pharmacist before using salt substitutes. Cook with plant-based oils, such as canola, sunflower, or olive oil. Meal planning When eating at a restaurant, ask if your food can be made with less salt or no salt. Avoid dishes labeled as brined, pickled, cured, or smoked. Avoid dishes made with soy sauce, miso, or teriyaki sauce. Avoid foods that have monosodium glutamate (MSG) in them. MSG may be added to some restaurant food, sauces, soups, bouillon, and canned foods. Make meals that  can be grilled, baked, poached, roasted, or steamed. These are often made with less sodium. General information Try to limit your sodium intake to 1,500-2,300 mg each day, or the amount told by your provider. What foods should I eat? Fruits Fresh, frozen, or canned fruit. Fruit juice. Vegetables Fresh or frozen vegetables. "No salt added" canned vegetables. "No salt added" tomato sauce and paste.  Low-sodium or reduced-sodium tomato and vegetable juice. Grains Low-sodium cereals, such as oats, puffed wheat and rice, and shredded wheat. Low-sodium crackers. Unsalted rice. Unsalted pasta. Low-sodium bread. Whole grain breads and whole grain pasta. Meats and other proteins Fresh or frozen meat, poultry, seafood, and fish. These should have no added salt. Low-sodium canned tuna and salmon. Unsalted nuts. Dried peas, beans, and lentils without added salt. Unsalted canned beans. Eggs. Unsalted nut butters. Dairy Milk. Soy milk. Cheese that is naturally low in sodium, such as ricotta cheese, fresh mozzarella, or Swiss cheese. Low-sodium or reduced-sodium cheese. Cream cheese. Yogurt. Seasonings and condiments Fresh and dried herbs and spices. Salt-free seasonings. Low-sodium mustard and ketchup. Sodium-free salad dressing. Sodium-free light mayonnaise. Fresh or refrigerated horseradish. Lemon juice. Vinegar. Other foods Homemade, reduced-sodium, or low-sodium soups. Unsalted popcorn and pretzels. Low-salt or salt-free chips. The items listed above may not be all the foods and drinks you can have. Talk to a dietitian to learn more. What foods should I avoid? Vegetables Sauerkraut, pickled vegetables, and relishes. Olives. [place] fries. Onion rings. Regular canned vegetables, except low-sodium or reduced-sodium items. Regular canned tomato sauce and paste. Regular tomato and vegetable juice. Frozen vegetables in sauces. Grains Instant hot cereals. Bread stuffing, pancake, and biscuit mixes. Croutons. Seasoned rice or pasta mixes. Noodle soup cups. Boxed or frozen macaroni and cheese. Regular salted crackers. Self-rising flour. Meats and other proteins Meat or fish that is salted, canned, smoked, spiced, or pickled. Precooked or cured meat, such as sausages or meat loaves. Tomasa Blase. Ham. Pepperoni. Hot dogs. Corned beef. Chipped beef. Salt pork. Jerky. Pickled herring, anchovies, and sardines. Regular  canned tuna. Salted nuts. Dairy Processed cheese and cheese spreads. Hard cheeses. Cheese curds. Blue cheese. Feta cheese. String cheese. Regular cottage cheese. Buttermilk. Canned milk. Fats and oils Salted butter. Regular margarine. Ghee. Bacon fat. Seasonings and condiments Onion salt, garlic salt, seasoned salt, table salt, and sea salt. Canned and packaged gravies. Worcestershire sauce. Tartar sauce. Barbecue sauce. Teriyaki sauce. Soy sauce, including reduced-sodium soy sauce. Steak sauce. Fish sauce. Oyster sauce. Cocktail sauce. Horseradish that you find on the shelf. Regular ketchup and mustard. Meat flavorings and tenderizers. Bouillon cubes. Hot sauce. Pre-made or packaged marinades. Pre-made or packaged taco seasonings. Relishes. Regular salad dressings. Salsa. Other foods Salted popcorn and pretzels. Corn chips and puffs. Potato and tortilla chips. Canned or dried soups. Pizza. Frozen entrees and pot pies. The items listed above may not be all the foods and drinks you should avoid. Talk to a dietitian to learn more. This information is not intended to replace advice given to you by your health care provider. Make sure you discuss any questions you have with your health care provider. Document Revised: 09/20/2022 Document Reviewed: 09/20/2022 Elsevier Patient Education  2024 [employer] Heart-Healthy Eating Plan Many factors influence your heart health, including eating and exercise habits. Heart health is also called coronary health. Coronary risk increases with abnormal blood fat (lipid) levels. A heart-healthy eating plan includes limiting unhealthy fats, increasing healthy fats, limiting salt (sodium) intake, and making other diet and lifestyle changes. What is my plan? Your  health care provider may recommend that: You limit your fat intake to _________% or less of your total calories each day. You limit your saturated fat intake to _________% or less of your total calories each  day. You limit the amount of cholesterol in your diet to less than _________ mg per day. You limit the amount of sodium in your diet to less than _________ mg per day. What are tips for following this plan? Cooking Cook foods using methods other than frying. Baking, boiling, grilling, and broiling are all good options. Other ways to reduce fat include: Removing the skin from poultry. Removing all visible fats from meats. Steaming vegetables in water or broth. Meal planning  At meals, imagine dividing your plate into fourths: Fill one-half of your plate with vegetables and green salads. Fill one-fourth of your plate with whole grains. Fill one-fourth of your plate with lean protein foods. Eat 2-4 cups of vegetables per day. One cup of vegetables equals 1 cup (91 g) broccoli or cauliflower florets, 2 medium carrots, 1 large bell pepper, 1 large sweet potato, 1 large tomato, 1 medium white potato, 2 cups (150 g) raw leafy greens. Eat 1-2 cups of fruit per day. One cup of fruit equals 1 small apple, 1 large banana, 1 cup (237 g) mixed fruit, 1 large orange,  cup (82 g) dried fruit, 1 cup (240 mL) 100% fruit juice. Eat more foods that contain soluble fiber. Examples include apples, broccoli, carrots, beans, peas, and barley. Aim to get 25-30 g of fiber per day. Increase your consumption of legumes, nuts, and seeds to 4-5 servings per week. One serving of dried beans or legumes equals  cup (90 g) cooked, 1 serving of nuts is  oz (12 almonds, 24 pistachios, or 7 walnut halves), and 1 serving of seeds equals  oz (8 g). Fats Choose healthy fats more often. Choose monounsaturated and polyunsaturated fats, such as olive and canola oils, avocado oil, flaxseeds, walnuts, almonds, and seeds. Eat more omega-3 fats. Choose salmon, mackerel, sardines, tuna, flaxseed oil, and ground flaxseeds. Aim to eat fish at least 2 times each week. Check food labels carefully to identify foods with trans fats or high  amounts of saturated fat. Limit saturated fats. These are found in animal products, such as meats, butter, and cream. Plant sources of saturated fats include palm oil, palm kernel oil, and coconut oil. Avoid foods with partially hydrogenated oils in them. These contain trans fats. Examples are stick margarine, some tub margarines, cookies, crackers, and other baked goods. Avoid fried foods. General information Eat more home-cooked food and less restaurant, buffet, and fast food. Limit or avoid alcohol. Limit foods that are high in added sugar and simple starches such as foods made using white refined flour (white breads, pastries, sweets). Lose weight if you are overweight. Losing just 5-10% of your body weight can help your overall health and prevent diseases such as diabetes and heart disease. Monitor your sodium intake, especially if you have high blood pressure. Talk with your health care provider about your sodium intake. Try to incorporate more vegetarian meals weekly. What foods should I eat? Fruits All fresh, canned (in natural juice), or frozen fruits. Vegetables Fresh or frozen vegetables (raw, steamed, roasted, or grilled). Green salads. Grains Most grains. Choose whole wheat and whole grains most of the time. Rice and pasta, including brown rice and pastas made with whole wheat. Meats and other proteins Lean, well-trimmed beef, veal, pork, and lamb. Chicken and [place] without  skin. All fish and shellfish. Wild duck, rabbit, pheasant, and venison. Egg whites or low-cholesterol egg substitutes. Dried beans, peas, lentils, and tofu. Seeds and most nuts. Dairy Low-fat or nonfat cheeses, including ricotta and mozzarella. Skim or 1% milk (liquid, powdered, or evaporated). Buttermilk made with low-fat milk. Nonfat or low-fat yogurt. Fats and oils Non-hydrogenated (trans-free) margarines. Vegetable oils, including soybean, sesame, sunflower, olive, avocado, peanut, safflower, corn, canola,  and cottonseed. Salad dressings or mayonnaise made with a vegetable oil. Beverages Water (mineral or sparkling). Coffee and tea. Unsweetened ice tea. Diet beverages. Sweets and desserts Sherbet, gelatin, and fruit ice. Small amounts of dark chocolate. Limit all sweets and desserts. Seasonings and condiments All seasonings and condiments. The items listed above may not be a complete list of foods and beverages you can eat. Contact a dietitian for more options. What foods should I avoid? Fruits Canned fruit in heavy syrup. Fruit in cream or butter sauce. Fried fruit. Limit coconut. Vegetables Vegetables cooked in cheese, cream, or butter sauce. Fried vegetables. Grains Breads made with saturated or trans fats, oils, or whole milk. Croissants. Sweet rolls. Donuts. High-fat crackers, such as cheese crackers and chips. Meats and other proteins Fatty meats, such as hot dogs, ribs, sausage, bacon, rib-eye roast or steak. High-fat deli meats, such as salami and bologna. Caviar. Domestic duck and goose. Organ meats, such as liver. Dairy Cream, sour cream, cream cheese, and creamed cottage cheese. Whole-milk cheeses. Whole or 2% milk (liquid, evaporated, or condensed). Whole buttermilk. Cream sauce or high-fat cheese sauce. Whole-milk yogurt. Fats and oils Meat fat, or shortening. Cocoa butter, hydrogenated oils, palm oil, coconut oil, palm kernel oil. Solid fats and shortenings, including bacon fat, salt pork, lard, and butter. Nondairy cream substitutes. Salad dressings with cheese or sour cream. Beverages Regular sodas and any drinks with added sugar. Sweets and desserts Frosting. Pudding. Cookies. Cakes. Pies. Milk chocolate or white chocolate. Buttered syrups. Full-fat ice cream or ice cream drinks. The items listed above may not be a complete list of foods and beverages to avoid. Contact a dietitian for more information. Summary Heart-healthy meal planning includes limiting unhealthy fats,  increasing healthy fats, limiting salt (sodium) intake and making other diet and lifestyle changes. Lose weight if you are overweight. Losing just 5-10% of your body weight can help your overall health and prevent diseases such as diabetes and heart disease. Focus on eating a balance of foods, including fruits and vegetables, low-fat or nonfat dairy, lean protein, nuts and legumes, whole grains, and heart-healthy oils and fats. This information is not intended to replace advice given to you by your health care provider. Make sure you discuss any questions you have with your health care provider. Document Revised: 10/09/2021 Document Reviewed: 10/09/2021 Elsevier Patient Education  2024 [employer].

## 2023-08-02 ENCOUNTER — Encounter: Payer: Self-pay | Admitting: Family

## 2023-08-02 NOTE — Progress Notes (Signed)
Post-Op Visit Note   Patient: Micheal Moore.           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  Plan: Begin daily Dial soap cleansing.  Dry dressings.  Follow-up in 2 weeks.  Follow-Up Instructions: No follow-ups on file.   Imaging: No results found.  Orders:  No orders of the defined types were placed in this encounter.  No orders of the defined types were placed in this encounter.    PMFS History: Patient Active Problem List   Diagnosis Date Noted   MRSA infection 06/24/2023   History of MDR Pseudomonas aeruginosa infection 06/24/2023   Osteomyelitis (HCC) 06/19/2023   Ambulatory dysfunction 05/01/2023   Weakness 04/24/2023   Medication management 04/16/2023   Bacteremia 04/12/2023   Aspiration pneumonia (HCC) 04/11/2023   Pressure injury of skin 04/11/2023   Nausea and vomiting 04/11/2023   Elevated troponin 04/11/2023   CAD (coronary artery disease) 04/11/2023   Hypokalemia 04/11/2023   PVD (peripheral vascular disease) (HCC) 04/11/2023   Debility 04/11/2023   Hyperglycemia 01/16/2023   STEMI (ST elevation myocardial infarction) (HCC) 01/15/2023   Diabetic ketoacidosis without coma associated with type 2 diabetes mellitus (HCC) 01/15/2023   Anterior dislocation of left hip (HCC) 08/24/2022   Closed dislocation of left hip (HCC) 08/23/2022   Normocytic anemia 08/23/2022   Protein-calorie malnutrition, moderate (HCC) 07/20/2022   Fall    Orthostatic hypotension    Hip fracture (HCC) 02/22/2022    Hyperlipidemia 02/22/2022   Chronic diastolic CHF (congestive heart failure) (HCC) 02/22/2022   Left great toe amputee (HCC) 05/06/2020   Cellulitis in diabetic foot (HCC) 04/27/2020   Osteomyelitis of great toe of left foot (HCC) 04/27/2020   Type 2 diabetes mellitus with hyperlipidemia (HCC) 04/27/2020   History of DVT (deep vein thrombosis) 04/27/2020   Tobacco abuse 04/27/2020   Past Medical History:  Diagnosis Date   Cancer (HCC)    right elbow melanoma   Diabetes mellitus without complication (HCC)    Type II   Dizziness    DVT (deep venous thrombosis) (HCC)    right leg   Frequent falls    Near syncope    Syncope     Family History  Problem Relation Age of Onset   Stroke Mother    Diabetes Father    Stroke Sister    Diabetes Sister     Past Surgical History:  Procedure Laterality Date   AMPUTATION Left 06/28/2023   Procedure: LEFT ABOVE KNEE AMPUTATION;  Surgeon: Nadara Mustard, MD;  Location: [facility] OR;  Service: Orthopedics;  Laterality: Left;   AMPUTATION TOE Left 04/28/2020   Procedure: AMPUTATION LEFT GREAT TOE;  Surgeon: Nadara Mustard, MD;  Location: [facility] OR;  Service: Orthopedics;  Laterality: Left;   CORONARY/GRAFT ACUTE MI REVASCULARIZATION N/A 01/15/2023   Procedure: Coronary/Graft Acute MI Revascularization;  Surgeon: Orbie Pyo, MD;  [facility] INVAS[facility] LAB;  Service: Cardiovascular;  Laterality: N/A;   INTRAMEDULLARY (IM) NAIL INTERTROCHANTERIC Right  02/23/2022   Procedure: INTRAMEDULLARY (IM) NAIL INTERTROCHANTRIC, RIGHT;  Surgeon: Samson Frederic, MD;  Location: [facility];  Service: Orthopedics;  Laterality: Right;   LEFT HEART CATH AND CORONARY ANGIOGRAPHY N/A 01/15/2023   Procedure: LEFT HEART CATH AND CORONARY ANGIOGRAPHY;  Surgeon: Orbie Pyo, MD;  [facility];  Service: Cardiovascular;  Laterality: N/A;   LOWER EXTREMITY ANGIOGRAPHY Bilateral 01/18/2023   Procedure: Lower Extremity Angiography;  Surgeon: Nada Libman, MD;   [facility]: [facility];  Service: Cardiovascular;  Laterality: Bilateral;   MELANOMA EXCISION WITH SENTINEL LYMPH NODE BIOPSY Right 08/19/2020   Procedure: WIDE LOCAL EXCISION RIGHT ELBOW MELANOMA WITH RIGHT AXILLARY SENTINEL LYMPH NODE BIOPSY;  Surgeon: Fritzi Mandes, MD;  [facility] OR;  Service: General;  Laterality: Right;  2nd incision in axilla   TEE WITHOUT CARDIOVERSION N/A 04/19/2023   Procedure: TRANSESOPHAGEAL ECHOCARDIOGRAM;  Surgeon: Little Ishikawa, MD;  Location: [facility] INVASIVE CV LAB;  Service: Cardiovascular;  Laterality: N/A;   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   Procedure: TOTAL HIP ARTHROPLASTY ANTERIOR APPROACH;  Surgeon: Samson Frederic, MD;  Location: [facility];  Service: Orthopedics;  Laterality: Left;   TOTAL HIP ARTHROPLASTY Left 08/24/2022   Procedure: OPEN REDUCTION, HEAD AND LINER EXCHANGE;  Surgeon: Samson Frederic, MD;  Location: [facility];  Service: Orthopedics;  Laterality: Left;   Social History   Occupational History    Comment: retired  Tobacco Use   Smoking status: Every Day    Current packs/day: 0.20    Average packs/day: 0.2 packs/day for 19.0 years (3.8 ttl pk-yrs)    Types: Cigarettes   Smokeless tobacco: Never  Vaping Use   Vaping status: Never Used  Substance and Sexual Activity   Alcohol use: No   Drug use: Not Currently   Sexual activity: Not on file

## 2023-08-18 DIAGNOSIS — R195 Other fecal abnormalities: Secondary | ICD-10-CM | POA: Diagnosis not present

## 2023-08-18 DIAGNOSIS — I739 Peripheral vascular disease, unspecified: Secondary | ICD-10-CM | POA: Diagnosis not present

## 2023-08-18 DIAGNOSIS — M25571 Pain in right ankle and joints of right foot: Secondary | ICD-10-CM | POA: Diagnosis not present

## 2023-08-18 DIAGNOSIS — Z79899 Other long term (current) drug therapy: Secondary | ICD-10-CM | POA: Diagnosis not present

## 2023-08-23 DIAGNOSIS — M79671 Pain in right foot: Secondary | ICD-10-CM | POA: Diagnosis not present

## 2023-08-24 DIAGNOSIS — B372 Candidiasis of skin and nail: Secondary | ICD-10-CM | POA: Diagnosis not present

## 2023-08-28 DIAGNOSIS — B372 Candidiasis of skin and nail: Secondary | ICD-10-CM | POA: Diagnosis not present

## 2023-08-28 DIAGNOSIS — R531 Weakness: Secondary | ICD-10-CM | POA: Diagnosis not present

## 2023-08-28 DIAGNOSIS — L309 Dermatitis, unspecified: Secondary | ICD-10-CM | POA: Diagnosis not present

## 2023-08-30 DIAGNOSIS — R233 Spontaneous ecchymoses: Secondary | ICD-10-CM | POA: Diagnosis not present

## 2023-08-30 DIAGNOSIS — L853 Xerosis cutis: Secondary | ICD-10-CM | POA: Diagnosis not present

## 2023-08-30 DIAGNOSIS — S80811A Abrasion, right lower leg, initial encounter: Secondary | ICD-10-CM | POA: Diagnosis not present

## 2023-08-30 DIAGNOSIS — L609 Nail disorder, unspecified: Secondary | ICD-10-CM | POA: Diagnosis not present

## 2023-09-01 DIAGNOSIS — N181 Chronic kidney disease, stage 1: Secondary | ICD-10-CM | POA: Diagnosis not present

## 2023-09-01 DIAGNOSIS — B372 Candidiasis of skin and nail: Secondary | ICD-10-CM | POA: Diagnosis not present

## 2023-09-01 DIAGNOSIS — R233 Spontaneous ecchymoses: Secondary | ICD-10-CM | POA: Diagnosis not present

## 2023-09-01 DIAGNOSIS — D649 Anemia, unspecified: Secondary | ICD-10-CM | POA: Diagnosis not present

## 2023-09-01 DIAGNOSIS — S80811D Abrasion, right lower leg, subsequent encounter: Secondary | ICD-10-CM | POA: Diagnosis not present

## 2023-09-01 DIAGNOSIS — R739 Hyperglycemia, unspecified: Secondary | ICD-10-CM | POA: Diagnosis not present

## 2023-09-04 DIAGNOSIS — B372 Candidiasis of skin and nail: Secondary | ICD-10-CM | POA: Diagnosis not present

## 2023-09-04 DIAGNOSIS — L22 Diaper dermatitis: Secondary | ICD-10-CM | POA: Diagnosis not present

## 2023-09-06 ENCOUNTER — Inpatient Hospital Stay: Payer: Medicare HMO | Attending: Hematology | Admitting: Hematology

## 2023-09-06 ENCOUNTER — Inpatient Hospital Stay: Payer: Medicare HMO

## 2023-09-06 NOTE — Progress Notes (Incomplete)
HEMATOLOGY/ONCOLOGY CONSULTATION NOTE  Date of Service: 09/06/2023  Patient Care Team: [name], MD as PCP - General (Endocrinology) Pricilla Riffle, MD as PCP - Cardiology (Cardiology) Nadara Mustard, MD as Consulting Physician (Orthopedic Surgery) [name], MD (Endocrinology)  CHIEF COMPLAINTS/PURPOSE OF CONSULTATION:  ***  HISTORY OF PRESENTING ILLNESS:  Micheal Moore. is a wonderful 72 y.o. male who has been referred to [place] by Dr Oneita Hurt for evaluation and management of ***  MEDICAL HISTORY:  Past Medical History:  Diagnosis Date   Cancer [facility])    right elbow melanoma   Diabetes mellitus without complication (HCC)    Type II   Dizziness    DVT (deep venous thrombosis) (HCC)    right leg   Frequent falls    Near syncope    Syncope     SURGICAL HISTORY: Past Surgical History:  Procedure Laterality Date   AMPUTATION Left 06/28/2023   Procedure: LEFT ABOVE KNEE AMPUTATION;  Surgeon: Nadara Mustard, MD;  [facility];  Service: Orthopedics;  Laterality: Left;   AMPUTATION TOE Left 04/28/2020   Procedure: AMPUTATION LEFT GREAT TOE;  Surgeon: Nadara Mustard, MD;  Location: [facility] OR;  Service: Orthopedics;  Laterality: Left;   CORONARY/GRAFT ACUTE MI REVASCULARIZATION N/A 01/15/2023   Procedure: Coronary/Graft Acute MI Revascularization;  Surgeon: Orbie Pyo, MD;  [facility];  Service: Cardiovascular;  Laterality: N/A;   INTRAMEDULLARY (IM) NAIL INTERTROCHANTERIC Right 02/23/2022   Procedure: INTRAMEDULLARY (IM) NAIL INTERTROCHANTRIC, RIGHT;  Surgeon: Samson Frederic, MD;  [facility];  Service: Orthopedics;  Laterality: Right;   LEFT HEART CATH AND CORONARY ANGIOGRAPHY N/A 01/15/2023   Procedure: LEFT HEART CATH AND CORONARY ANGIOGRAPHY;  Surgeon: Orbie Pyo, MD;  [facility];  Service: Cardiovascular;  Laterality: N/A;   LOWER EXTREMITY ANGIOGRAPHY Bilateral 01/18/2023   Procedure: Lower Extremity  Angiography;  Surgeon: Nada Libman, MD;  [facility];  Service: Cardiovascular;  Laterality: Bilateral;   MELANOMA EXCISION WITH SENTINEL LYMPH NODE BIOPSY Right 08/19/2020   Procedure: WIDE LOCAL EXCISION RIGHT ELBOW MELANOMA WITH RIGHT AXILLARY SENTINEL LYMPH NODE BIOPSY;  Surgeon: Fritzi Mandes, MD;  [facility];  Service: General;  Laterality: Right;  2nd incision in axilla   TEE WITHOUT CARDIOVERSION N/A 04/19/2023   Procedure: TRANSESOPHAGEAL ECHOCARDIOGRAM;  Surgeon: Little Ishikawa, MD;  Location: [facility] INVASIVE CV LAB;  Service: Cardiovascular;  Laterality: N/A;   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   Procedure: TOTAL HIP ARTHROPLASTY ANTERIOR APPROACH;  Surgeon: Samson Frederic, MD;  [facility];  Service: Orthopedics;  Laterality: Left;   TOTAL HIP ARTHROPLASTY Left 08/24/2022   Procedure: OPEN REDUCTION, HEAD AND LINER EXCHANGE;  Surgeon: Samson Frederic, MD;  Location: [facility];  Service: Orthopedics;  Laterality: Left;    SOCIAL HISTORY: Social History   Socioeconomic History   Marital status: Married    Spouse name: [name]   Number of children: 1   Years of education: Not on file   Highest education level: Associate degree: occupational, Scientist, product/process development, or vocational program  Occupational History    Comment: retired  Tobacco Use   Smoking status: Every Day    Current packs/day: 0.20    Average packs/day: 0.2 packs/day for 19.0 years (3.8 ttl pk-yrs)    Types: Cigarettes   Smokeless tobacco: Never  Vaping Use   Vaping status: Never Used  Substance and Sexual Activity   Alcohol use: No  Drug use: Not Currently   Sexual activity: Not on file  Other Topics Concern   Not on file  Social History Narrative   Lives with wife   Social Drivers of Health   Financial Resource Strain: Not on file  Food Insecurity: No Food Insecurity (07/05/2023)   Hunger Vital Sign    Worried About Running Out of Food in the Last Year: Never true    Ran Out  of Food in the Last Year: Never true  Transportation Needs: No Transportation Needs (07/05/2023)   PRAPARE - Administrator, Civil Service (Medical): No    Lack of Transportation (Non-Medical): No  Physical Activity: Not on file  Stress: Not on file  Social Connections: Not on file  Intimate Partner Violence: Not At Risk (07/05/2023)   Humiliation, Afraid, Rape, and Kick questionnaire    Fear of Current or Ex-Partner: No    Emotionally Abused: No    Physically Abused: No    Sexually Abused: No    FAMILY HISTORY: Family History  Problem Relation Age of Onset   Stroke Mother    Diabetes Father    Stroke Sister    Diabetes Sister     ALLERGIES:  has no known allergies.  MEDICATIONS:  Current Outpatient Medications  Medication Sig Dispense Refill   acetaminophen (TYLENOL) 325 MG tablet Take 2 tablets (650 mg total) by mouth every 6 (six) hours as needed for mild pain, fever or headache.     ascorbic acid (VITAMIN C) 1000 MG tablet Take 1 tablet (1,000 mg total) by mouth daily.     aspirin EC 81 MG tablet Take 1 tablet (81 mg total) by mouth daily. Swallow whole. 30 tablet 12   atorvastatin (LIPITOR) 10 MG tablet One tablet by mouth twice a week     barrier cream (NON-SPECIFIED) CREA Apply 1 Application topically 2 (two) times daily as needed.     bisacodyl (DULCOLAX) 5 MG EC tablet Take 1 tablet (5 mg total) by mouth daily as needed for moderate constipation or severe constipation.     Blood Glucose Monitoring Suppl DEVI 1 each by Does not apply route 3 (three) times daily. May dispense any manufacturer covered by patient's insurance. 1 each 0   BRILINTA 90 MG TABS tablet Take 90 mg by mouth 2 (two) times daily.     docusate sodium (COLACE) 100 MG capsule Take 1 capsule (100 mg total) by mouth daily.     Ensure Max Protein (ENSURE MAX PROTEIN) LIQD Take 330 mLs (11 oz total) by mouth 2 (two) times daily. (Patient taking differently: Take 11 oz by mouth daily.)      fludrocortisone (FLORINEF) 0.1 MG tablet Take 2 tablets (0.2 mg total) by mouth daily.     Glucagon, rDNA, (GLUCAGON EMERGENCY) 1 MG KIT Inject 1 mg into the skin as needed for up to 2 doses (Severe low blood sugar). 1 kit 0   Glucose Blood (BLOOD GLUCOSE TEST STRIPS) STRP 1 each by Does not apply route 3 (three) times daily. Use as directed to check blood sugar. May dispense any manufacturer covered by patient's insurance and fits patient's device. 100 strip 0   insulin aspart (NOVOLOG) 100 UNIT/ML FlexPen Inject 8 Units into the skin 3 (three) times daily with meals. Only take if eating a meal AND Blood Glucose (BG) is 80 or higher. 15 mL 0   insulin detemir (LEVEMIR) 100 UNIT/ML FlexPen Inject 20 Units into the skin at bedtime. DO NOT TAKE  if your blood sugars and consistently running < 120 15 mL 0   Insulin Pen Needle (PEN NEEDLES) 31G X 5 MM MISC 1 each by Does not apply route 3 (three) times daily. May dispense any manufacturer covered by patient's insurance. 100 each 0   JARDIANCE 10 MG TABS tablet Take 10 mg by mouth daily.     Lancet Device MISC 1 each by Does not apply route 3 (three) times daily. May dispense any manufacturer covered by patient's insurance. 1 each 0   Lancets MISC 1 each by Does not apply route 3 (three) times daily. Use as directed to check blood sugar. May dispense any manufacturer covered by patient's insurance and fits patient's device. 100 each 0   magnesium citrate SOLN Take 296 mLs (1 Bottle total) by mouth daily as needed for severe constipation or moderate constipation.     oxyCODONE (OXY IR/ROXICODONE) 5 MG immediate release tablet Take 1-2 tablets (5-10 mg total) by mouth every 4 (four) hours as needed for moderate pain, severe pain or breakthrough pain (pain score 4-6). 30 tablet 0   polyethylene glycol (MIRALAX / GLYCOLAX) 17 g packet Take 17 g by mouth daily.     senna (SENOKOT) 8.6 MG TABS tablet Take 1 tablet (8.6 mg total) by mouth 2 (two) times daily as  needed for mild constipation or moderate constipation.     tamsulosin (FLOMAX) 0.4 MG CAPS capsule Take 1 capsule (0.4 mg total) by mouth daily after supper.     No current facility-administered medications for this visit.    REVIEW OF SYSTEMS:    10 Point review of Systems was done is negative except as noted above.  PHYSICAL EXAMINATION: ECOG PERFORMANCE STATUS: {CHL ONC ECOG FI:[id number]}  .There were no vitals filed for this visit. There were no vitals filed for this visit. .There is no height or weight on file to calculate BMI.  GENERAL:alert, in no acute distress and comfortable SKIN: no acute rashes, no significant lesions EYES: conjunctiva are pink and non-injected, sclera anicteric OROPHARYNX: MMM, no exudates, no oropharyngeal erythema or ulceration NECK: supple, no JVD LYMPH:  no palpable lymphadenopathy in the cervical, axillary or inguinal regions LUNGS: clear to auscultation b/l with normal respiratory effort HEART: regular rate & rhythm ABDOMEN:  normoactive bowel sounds , non tender, not distended. Extremity: no pedal edema PSYCH: alert & oriented x 3 with fluent speech NEURO: no focal motor/sensory deficits  LABORATORY DATA:  I have reviewed the data as listed  .    Latest Ref Rng & Units 07/03/2023    4:56 AM 07/02/2023    5:07 AM 07/01/2023    6:35 AM  CBC  WBC 4.0 - 10.5 K/uL 5.5  6.4  6.5   Hemoglobin 13.0 - 17.0 g/dL 9.4  9.2  9.7   Hematocrit 39.0 - 52.0 % 29.3  29.1  30.2   Platelets 150 - 400 K/uL 341  262  236     .    Latest Ref Rng & Units 07/03/2023    4:56 AM 07/02/2023   11:50 PM 06/30/2023    5:30 AM  CMP  Glucose 70 - 99 mg/dL  166  063   BUN 8 - 23 mg/dL  26  15   Creatinine 0.16 - 1.24 mg/dL 0.10  9.32  3.55   Sodium 135 - 145 mmol/L  135  132   Potassium 3.5 - 5.1 mmol/L  3.9  3.6   Chloride 98 - 111 mmol/L  101  97   CO2 22 - 32 mmol/L  25  25   Calcium 8.9 - 10.3 mg/dL  8.7  8.5      RADIOGRAPHIC STUDIES: I have  personally reviewed the radiological images as listed and agreed with the findings in the report. No results found.  ASSESSMENT & PLAN:  ***  All of the patients questions were answered with apparent satisfaction. The patient knows to call the clinic with any problems, questions or concerns.  I spent {CHL ONC TIME VISIT - BMWUX:[id number]} counseling the patient face to face. The total time spent in the appointment was {CHL ONC TIME VISIT - ZDGUY:[id number]} and more than 50% was on counseling and direct patient cares.    Wyvonnia Lora MD MS AAHIVMS [facility] Hematology/Oncology Physician [facility]  (Office):       [phone] (Work cell):  [phone] (Fax):           [phone]  09/06/2023 12:13 PM    I,Param Shah,acting as a scribe for Wyvonnia Lora, MD.,have documented all relevant documentation on the behalf of Wyvonnia Lora, MD,as directed by  Wyvonnia Lora, MD while in the presence of Wyvonnia Lora, MD.

## 2023-09-07 DIAGNOSIS — S40811A Abrasion of right upper arm, initial encounter: Secondary | ICD-10-CM | POA: Diagnosis not present

## 2023-09-11 DIAGNOSIS — L0100 Impetigo, unspecified: Secondary | ICD-10-CM | POA: Diagnosis not present

## 2023-09-11 DIAGNOSIS — L299 Pruritus, unspecified: Secondary | ICD-10-CM | POA: Diagnosis not present

## 2023-09-11 DIAGNOSIS — B372 Candidiasis of skin and nail: Secondary | ICD-10-CM | POA: Diagnosis not present

## 2023-09-12 DIAGNOSIS — B372 Candidiasis of skin and nail: Secondary | ICD-10-CM | POA: Diagnosis not present

## 2023-09-12 DIAGNOSIS — L0100 Impetigo, unspecified: Secondary | ICD-10-CM | POA: Diagnosis not present

## 2023-09-12 DIAGNOSIS — L299 Pruritus, unspecified: Secondary | ICD-10-CM | POA: Diagnosis not present

## 2023-09-12 DIAGNOSIS — S30810A Abrasion of lower back and pelvis, initial encounter: Secondary | ICD-10-CM | POA: Diagnosis not present

## 2023-09-15 DIAGNOSIS — L853 Xerosis cutis: Secondary | ICD-10-CM | POA: Diagnosis not present

## 2023-09-16 ENCOUNTER — Ambulatory Visit: Payer: Medicare HMO | Admitting: Orthopedic Surgery

## 2023-09-16 ENCOUNTER — Encounter: Payer: Self-pay | Admitting: Orthopedic Surgery

## 2023-09-16 DIAGNOSIS — S78119A Complete traumatic amputation at level between unspecified hip and knee, initial encounter: Secondary | ICD-10-CM

## 2023-09-16 NOTE — Progress Notes (Unsigned)
Office Visit Note   Patient: Micheal Moore.           Date of Birth: Oct 14, 1950           MRN: [id number] Visit Date: 09/16/2023              Requested by: [name], MD [address] [place],  [place] [address] PCP: [name], MD  Chief Complaint  Patient presents with  . Left Leg - Routine Post Op    06/28/2023 left AKA       HPI: ***  Assessment & Plan: Visit Diagnoses:  1. Above-knee amputation (HCC)     Plan: ***  Follow-Up Instructions: Return in about 3 months (around 12/15/2023).   Ortho Exam  Patient is alert, oriented, no adenopathy, well-dressed, normal affect, normal respiratory effort. ***  Imaging: No results found. No images are attached to the encounter.  Labs: Lab Results  Component Value Date   HGBA1C 8.4 (H) 06/28/2023   HGBA1C 8.4 (H) 06/19/2023   HGBA1C 13.3 (H) 01/15/2023   ESRSEDRATE 59 (H) 06/21/2023   ESRSEDRATE 53 (H) 04/27/2020   CRP 11.6 (H) 06/19/2023   CRP 6.1 (H) 04/27/2020   REPTSTATUS 06/24/2023 FINAL 06/21/2023   GRAMSTAIN  06/21/2023    NO WBC SEEN RARE GRAM POSITIVE RODS RARE GRAM POSITIVE COCCI IN PAIRS Performed at [facility], [address], [place], [place] [address]    CULT  06/21/2023    FEW METHICILLIN RESISTANT STAPHYLOCOCCUS AUREUS RARE PSEUDOMONAS AERUGINOSA    LABORGA METHICILLIN RESISTANT STAPHYLOCOCCUS AUREUS 06/21/2023   LABORGA PSEUDOMONAS AERUGINOSA 06/21/2023     Lab Results  Component Value Date   ALBUMIN 3.0 (L) 06/27/2023   ALBUMIN 2.7 (L) 06/26/2023   ALBUMIN 2.7 (L) 06/22/2023   PREALBUMIN 20 06/27/2023   PREALBUMIN 19.6 02/23/2022   PREALBUMIN 11.2 (L) 04/27/2020    Lab Results  Component Value Date   MG 2.2 07/02/2023   MG 2.2 06/30/2023   MG 2.4 06/26/2023   Lab Results  Component Value Date   VD25OH 22.64 (L) 04/24/2023   VD25OH 15.13 (L) 02/23/2022    Lab Results  Component Value Date   PREALBUMIN 20 06/27/2023   PREALBUMIN 19.6  02/23/2022   PREALBUMIN 11.2 (L) 04/27/2020      Latest Ref Rng & Units 07/03/2023    4:56 AM 07/02/2023    5:07 AM 07/01/2023    6:35 AM  CBC EXTENDED  WBC 4.0 - 10.5 K/uL 5.5  6.4  6.5   RBC 4.22 - 5.81 MIL/uL 3.10  3.13  3.20   Hemoglobin 13.0 - 17.0 g/dL 9.4  9.2  9.7   HCT 84.6 - 52.0 % 29.3  29.1  30.2   Platelets 150 - 400 K/uL 341  262  236      There is no height or weight on file to calculate BMI.  Orders:  No orders of the defined types were placed in this encounter.  No orders of the defined types were placed in this encounter.    Procedures: No procedures performed  Clinical Data: No additional findings.  ROS:  All other systems negative, except as noted in the HPI. Review of Systems  Objective: Vital Signs: There were no vitals taken for this visit.  Specialty Comments:  No specialty comments available.  PMFS History: Patient Active Problem List   Diagnosis Date Noted  . MRSA infection 06/24/2023  . History of MDR Pseudomonas aeruginosa infection 06/24/2023  .  Osteomyelitis (HCC) 06/19/2023  . Ambulatory dysfunction 05/01/2023  . Weakness 04/24/2023  . Medication management 04/16/2023  . Bacteremia 04/12/2023  . Aspiration pneumonia (HCC) 04/11/2023  . Pressure injury of skin 04/11/2023  . Nausea and vomiting 04/11/2023  . Elevated troponin 04/11/2023  . CAD (coronary artery disease) 04/11/2023  . Hypokalemia 04/11/2023  . PVD (peripheral vascular disease) (HCC) 04/11/2023  . Debility 04/11/2023  . Hyperglycemia 01/16/2023  . STEMI (ST elevation myocardial infarction) (HCC) 01/15/2023  . Diabetic ketoacidosis without coma associated with type 2 diabetes mellitus (HCC) 01/15/2023  . Anterior dislocation of left hip (HCC) 08/24/2022  . Closed dislocation of left hip (HCC) 08/23/2022  . Normocytic anemia 08/23/2022  . Protein-calorie malnutrition, moderate (HCC) 07/20/2022  . Fall   . Orthostatic hypotension   . Hip fracture (HCC)  02/22/2022  . Hyperlipidemia 02/22/2022  . Chronic diastolic CHF (congestive heart failure) (HCC) 02/22/2022  . Left great toe amputee (HCC) 05/06/2020  . Cellulitis in diabetic foot (HCC) 04/27/2020  . Osteomyelitis of great toe of left foot (HCC) 04/27/2020  . Type 2 diabetes mellitus with hyperlipidemia (HCC) 04/27/2020  . History of DVT (deep vein thrombosis) 04/27/2020  . Tobacco abuse 04/27/2020   Past Medical History:  Diagnosis Date  . Cancer (HCC)    right elbow melanoma  . Diabetes mellitus without complication (HCC)    Type II  . Dizziness   . DVT (deep venous thrombosis) (HCC)    right leg  . Frequent falls   . Near syncope   . Syncope     Family History  Problem Relation Age of Onset  . Stroke Mother   . Diabetes Father   . Stroke Sister   . Diabetes Sister     Past Surgical History:  Procedure Laterality Date  . AMPUTATION Left 06/28/2023   Procedure: LEFT ABOVE KNEE AMPUTATION;  Surgeon: Nadara Mustard, MD;  Location: [facility] OR;  Service: Orthopedics;  Laterality: Left;  . AMPUTATION TOE Left 04/28/2020   Procedure: AMPUTATION LEFT GREAT TOE;  Surgeon: Nadara Mustard, MD;  Location: [facility] OR;  Service: Orthopedics;  Laterality: Left;  . CORONARY/GRAFT ACUTE MI REVASCULARIZATION N/A 01/15/2023   Procedure: Coronary/Graft Acute MI Revascularization;  Surgeon: Orbie Pyo, MD;  [facility];  Service: Cardiovascular;  Laterality: N/A;  . INTRAMEDULLARY (IM) NAIL INTERTROCHANTERIC Right 02/23/2022   Procedure: INTRAMEDULLARY (IM) NAIL INTERTROCHANTRIC, RIGHT;  Surgeon: Samson Frederic, MD;  Location: [facility];  Service: Orthopedics;  Laterality: Right;  . LEFT HEART CATH AND CORONARY ANGIOGRAPHY N/A 01/15/2023   Procedure: LEFT HEART CATH AND CORONARY ANGIOGRAPHY;  Surgeon: Orbie Pyo, MD;  [facility];  Service: Cardiovascular;  Laterality: N/A;  . LOWER EXTREMITY ANGIOGRAPHY Bilateral 01/18/2023   Procedure: Lower Extremity  Angiography;  Surgeon: Nada Libman, MD;  [facility];  Service: Cardiovascular;  Laterality: Bilateral;  . MELANOMA EXCISION WITH SENTINEL LYMPH NODE BIOPSY Right 08/19/2020   Procedure: WIDE LOCAL EXCISION RIGHT ELBOW MELANOMA WITH RIGHT AXILLARY SENTINEL LYMPH NODE BIOPSY;  Surgeon: Fritzi Mandes, MD;  Location: [facility] OR;  Service: General;  Laterality: Right;  2nd incision in axilla  . TEE WITHOUT CARDIOVERSION N/A 04/19/2023   Procedure: TRANSESOPHAGEAL ECHOCARDIOGRAM;  Surgeon: Little Ishikawa, MD;  Location: [facility] INVASIVE CV LAB;  Service: Cardiovascular;  Laterality: N/A;  . 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   Procedure: TOTAL HIP ARTHROPLASTY ANTERIOR APPROACH;  Surgeon: Samson Frederic, MD;  Location: [facility];  Service: Orthopedics;  Laterality: Left;  . TOTAL HIP ARTHROPLASTY Left 08/24/2022   Procedure: OPEN REDUCTION, HEAD AND LINER EXCHANGE;  Surgeon: Samson Frederic, MD;  Location: [facility];  Service: Orthopedics;  Laterality: Left;   Social History   Occupational History    Comment: retired  Tobacco Use  . Smoking status: Every Day    Current packs/day: 0.20    Average packs/day: 0.2 packs/day for 19.0 years (3.8 ttl pk-yrs)    Types: Cigarettes  . Smokeless tobacco: Never  Vaping Use  . Vaping status: Never Used  Substance and Sexual Activity  . Alcohol use: No  . Drug use: Not Currently  . Sexual activity: Not on file

## 2023-09-17 ENCOUNTER — Telehealth: Payer: Self-pay | Admitting: Orthopedic Surgery

## 2023-09-17 NOTE — Telephone Encounter (Signed)
 Rx sent with demo/picture of insurance card.

## 2023-09-17 NOTE — Telephone Encounter (Signed)
Received call from [facility]. Patient has appt. On Fiday 1/3 for prosthetic and they are asking for the orders and insurance card to be faxed in time for his appt. Ph [phone], fax [phone]

## 2023-10-07 NOTE — Progress Notes (Unsigned)
[facility] CONSULT NOTE  Patient Care Team: [name], MD as PCP - General (Endocrinology) Pricilla Riffle, MD as PCP - Cardiology (Cardiology) Nadara Mustard, MD as Consulting Physician (Orthopedic Surgery) [name], MD (Endocrinology)  ASSESSMENT & PLAN:  73 y.o.male with history of CAD, PCI, PAD, DM2, referred to [facility] for elevated free light chain.  Discussed with patient what MGUS is vs SMM and MM. Recommend additional testing for evaluation. To rule out multiple myeloma, I recommend complete blood work, 24 hour urine collection for UPEP and skeletal survey to rule out multiple myeloma Depending on test results, we may or may not proceed with bone marrow aspirate and biopsy.  Patient is fine with proceeding with bone marrow biopsy if needed.  If testing showing MGUS we will need long term monitoring.  If testing showed active myeloma, treatment will be discussed.  Patient will return after testing results available to discuss follow up plan.  No orders of the defined types were placed in this encounter.   All questions were answered. The patient knows to call the clinic with any problems, questions or concerns. I spent {CHL ONC TIME VISIT - ZOXWR:[id number]} counseling the patient face to face. The total time spent in the appointment was {CHL ONC TIME VISIT - BJYNW:[id number]} and more than 50% was on counseling.     Melven Sartorius, MD 10/07/23 10:28 AM  CHIEF COMPLAINTS/PURPOSE OF CONSULTATION:  MGUS  HISTORY OF PRESENTING ILLNESS:  Micheal Moore. 73 y.o. male is here because of elevated light chain.  07/2023 outside labs show kappa light chain 30.5 mg/L (3.3-19.4)  then 17.7 mg/L (5.7-26.3)  K/L of 1.72. IgA 532 IgG 978 IgM 33. SPEP reported M spike is too small to quantitate.  Immunofixation was negative for monoclonal protein.  No CBC and CMP reported.  He denies history of abnormal bone pain or bone  fracture. Patient denies history of recurrent infection or atypical infections such as shingles of meningitis. Denies chills, night sweats, anorexia or abnormal weight loss.  MEDICAL HISTORY:  Past Medical History:  Diagnosis Date   Cancer (HCC)    right elbow melanoma   Diabetes mellitus without complication (HCC)    Type II   Dizziness    DVT (deep venous thrombosis) (HCC)    right leg   Frequent falls    Near syncope    Syncope     SURGICAL HISTORY: Past Surgical History:  Procedure Laterality Date   AMPUTATION Left 06/28/2023   Procedure: LEFT ABOVE KNEE AMPUTATION;  Surgeon: Nadara Mustard, MD;  Location: [facility] OR;  Service: Orthopedics;  Laterality: Left;   AMPUTATION TOE Left 04/28/2020   Procedure: AMPUTATION LEFT GREAT TOE;  Surgeon: Nadara Mustard, MD;  Location: [facility] OR;  Service: Orthopedics;  Laterality: Left;   CORONARY/GRAFT ACUTE MI REVASCULARIZATION N/A 01/15/2023   Procedure: Coronary/Graft Acute MI Revascularization;  Surgeon: Orbie Pyo, MD;  Location: [facility] INVASIVE CV LAB;  Service: Cardiovascular;  Laterality: N/A;   INTRAMEDULLARY (IM) NAIL INTERTROCHANTERIC Right 02/23/2022   Procedure: INTRAMEDULLARY (IM) NAIL INTERTROCHANTRIC, RIGHT;  Surgeon: Samson Frederic, MD;  Location: [facility] ORS;  Service: Orthopedics;  Laterality: Right;   LEFT HEART CATH AND CORONARY ANGIOGRAPHY N/A 01/15/2023   Procedure: LEFT HEART CATH AND CORONARY ANGIOGRAPHY;  Surgeon: Orbie Pyo, MD;  Location: [facility] INVASIVE CV LAB;  Service: Cardiovascular;  Laterality: N/A;   LOWER EXTREMITY ANGIOGRAPHY Bilateral 01/18/2023   Procedure: Lower Extremity Angiography;  Surgeon: Nada Libman, MD;  Location: [facility] INVASIVE CV LAB;  Service: Cardiovascular;  Laterality: Bilateral;   MELANOMA EXCISION WITH SENTINEL LYMPH NODE BIOPSY Right 08/19/2020   Procedure: WIDE LOCAL EXCISION RIGHT ELBOW MELANOMA WITH RIGHT AXILLARY SENTINEL LYMPH NODE BIOPSY;  Surgeon: Fritzi Mandes, MD;  Location: [facility] OR;   Service: General;  Laterality: Right;  2nd incision in axilla   TEE WITHOUT CARDIOVERSION N/A 04/19/2023   Procedure: TRANSESOPHAGEAL ECHOCARDIOGRAM;  Surgeon: Little Ishikawa, MD;  Location[facility] INVASIVE CV LAB;  Service: Cardiovascular;  Laterality: N/A;   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   Procedure: TOTAL HIP ARTHROPLASTY ANTERIOR APPROACH;  Surgeon: Samson Frederic, MD;  Location: [facility] ORS;  Service: Orthopedics;  Laterality: Left;   TOTAL HIP ARTHROPLASTY Left 08/24/2022   Procedure: OPEN REDUCTION, HEAD AND LINER EXCHANGE;  Surgeon: Samson Frederic, MD;  Location: [facility] ORS;  Service: Orthopedics;  Laterality: Left;    SOCIAL HISTORY: Social History   Socioeconomic History   Marital status: Married    Spouse name: [name]   Number of children: 1   Years of education: Not on file   Highest education level: Associate degree: occupational, Scientist, product/process development, or vocational program  Occupational History    Comment: retired  Tobacco Use   Smoking status: Every Day    Current packs/day: 0.20    Average packs/day: 0.2 packs/day for 19.0 years (3.8 ttl pk-yrs)    Types: Cigarettes   Smokeless tobacco: Never  Vaping Use   Vaping status: Never Used  Substance and Sexual Activity   Alcohol use: No   Drug use: Not Currently   Sexual activity: Not on file  Other Topics Concern   Not on file  Social History Narrative   Lives with wife   Social Drivers of Health   Financial Resource Strain: Not on file  Food Insecurity: No Food Insecurity (07/05/2023)   Hunger Vital Sign    Worried About Running Out of Food in the Last Year: Never true    Ran Out of Food in the Last Year: Never true  Transportation Needs: No Transportation Needs (07/05/2023)   PRAPARE - Administrator, Civil Service (Medical): No    Lack of Transportation (Non-Medical): No  Physical Activity: Not on file  Stress: Not on file  Social Connections: Not on file  Intimate Partner Violence: Not At Risk  (07/05/2023)   Humiliation, Afraid, Rape, and Kick questionnaire    Fear of Current or Ex-Partner: No    Emotionally Abused: No    Physically Abused: No    Sexually Abused: No    FAMILY HISTORY: Family History  Problem Relation Age of Onset   Stroke Mother    Diabetes Father    Stroke Sister    Diabetes Sister     ALLERGIES:  has no known allergies.  MEDICATIONS:  Current Outpatient Medications  Medication Sig Dispense Refill   acetaminophen (TYLENOL) 325 MG tablet Take 2 tablets (650 mg total) by mouth every 6 (six) hours as needed for mild pain, fever or headache.     ascorbic acid (VITAMIN C) 1000 MG tablet Take 1 tablet (1,000 mg total) by mouth daily.     aspirin EC 81 MG tablet Take 1 tablet (81 mg total) by mouth daily. Swallow whole. 30 tablet 12   atorvastatin (LIPITOR) 10 MG tablet One tablet by mouth twice a week     barrier cream (NON-SPECIFIED) CREA Apply 1 Application topically 2 (two) times daily as needed.  bisacodyl (DULCOLAX) 5 MG EC tablet Take 1 tablet (5 mg total) by mouth daily as needed for moderate constipation or severe constipation.     Blood Glucose Monitoring Suppl DEVI 1 each by Does not apply route 3 (three) times daily. May dispense any manufacturer covered by patient's insurance. 1 each 0   BRILINTA 90 MG TABS tablet Take 90 mg by mouth 2 (two) times daily.     docusate sodium (COLACE) 100 MG capsule Take 1 capsule (100 mg total) by mouth daily.     Ensure Max Protein (ENSURE MAX PROTEIN) LIQD Take 330 mLs (11 oz total) by mouth 2 (two) times daily. (Patient taking differently: Take 11 oz by mouth daily.)     fludrocortisone (FLORINEF) 0.1 MG tablet Take 2 tablets (0.2 mg total) by mouth daily.     Glucagon, rDNA, (GLUCAGON EMERGENCY) 1 MG KIT Inject 1 mg into the skin as needed for up to 2 doses (Severe low blood sugar). 1 kit 0   Glucose Blood (BLOOD GLUCOSE TEST STRIPS) STRP 1 each by Does not apply route 3 (three) times daily. Use as directed  to check blood sugar. May dispense any manufacturer covered by patient's insurance and fits patient's device. 100 strip 0   insulin aspart (NOVOLOG) 100 UNIT/ML FlexPen Inject 8 Units into the skin 3 (three) times daily with meals. Only take if eating a meal AND Blood Glucose (BG) is 80 or higher. 15 mL 0   insulin detemir (LEVEMIR) 100 UNIT/ML FlexPen Inject 20 Units into the skin at bedtime. DO NOT TAKE if your blood sugars and consistently running < 120 15 mL 0   Insulin Pen Needle (PEN NEEDLES) 31G X 5 MM MISC 1 each by Does not apply route 3 (three) times daily. May dispense any manufacturer covered by patient's insurance. 100 each 0   JARDIANCE 10 MG TABS tablet Take 10 mg by mouth daily.     Lancet Device MISC 1 each by Does not apply route 3 (three) times daily. May dispense any manufacturer covered by patient's insurance. 1 each 0   Lancets MISC 1 each by Does not apply route 3 (three) times daily. Use as directed to check blood sugar. May dispense any manufacturer covered by patient's insurance and fits patient's device. 100 each 0   magnesium citrate SOLN Take 296 mLs (1 Bottle total) by mouth daily as needed for severe constipation or moderate constipation.     oxyCODONE (OXY IR/ROXICODONE) 5 MG immediate release tablet Take 1-2 tablets (5-10 mg total) by mouth every 4 (four) hours as needed for moderate pain, severe pain or breakthrough pain (pain score 4-6). 30 tablet 0   polyethylene glycol (MIRALAX / GLYCOLAX) 17 g packet Take 17 g by mouth daily.     senna (SENOKOT) 8.6 MG TABS tablet Take 1 tablet (8.6 mg total) by mouth 2 (two) times daily as needed for mild constipation or moderate constipation.     tamsulosin (FLOMAX) 0.4 MG CAPS capsule Take 1 capsule (0.4 mg total) by mouth daily after supper.     No current facility-administered medications for this visit.    REVIEW OF SYSTEMS:   All relevant systems were reviewed with the patient and are negative.  PHYSICAL  EXAMINATION: ECOG PERFORMANCE STATUS: {CHL ONC ECOG PS:[phone]}  There were no vitals filed for this visit. There were no vitals filed for this visit.  GENERAL: alert, no distress and comfortable SKIN: skin color is normal, no jaundice, rashes or significant lesions OROPHARYNX:  no exudate, no erythema, no macroglossia. NECK: supple LYMPH:  no palpable lymphadenopathy in the cervical regions LUNGS: Effort normal, no respiratory distress.  Clear to auscultation bilaterally HEART: regular rate & rhythm and no lower extremity edema ABDOMEN: soft, non-tender and nondistended Musculoskeletal: no point tenderness NEURO: no focal motor/sensory deficits  LABORATORY DATA:  I have reviewed the data as listed Lab Results  Component Value Date   WBC 5.5 07/03/2023   HGB 9.4 (L) 07/03/2023   HCT 29.3 (L) 07/03/2023   MCV 94.5 07/03/2023   PLT 341 07/03/2023    RADIOGRAPHIC STUDIES: I have personally reviewed the radiological images as listed and agreed with the findings in the report. No results found.

## 2023-10-10 ENCOUNTER — Inpatient Hospital Stay: Payer: Medicare Other

## 2023-10-10 ENCOUNTER — Inpatient Hospital Stay: Payer: Medicare Other | Attending: Hematology

## 2023-10-10 VITALS — BP 137/64 | HR 100 | Temp 97.3°F | Resp 19

## 2023-10-10 DIAGNOSIS — F17210 Nicotine dependence, cigarettes, uncomplicated: Secondary | ICD-10-CM | POA: Insufficient documentation

## 2023-10-10 DIAGNOSIS — Z7902 Long term (current) use of antithrombotics/antiplatelets: Secondary | ICD-10-CM | POA: Insufficient documentation

## 2023-10-10 DIAGNOSIS — Z89612 Acquired absence of left leg above knee: Secondary | ICD-10-CM | POA: Insufficient documentation

## 2023-10-10 DIAGNOSIS — R768 Other specified abnormal immunological findings in serum: Secondary | ICD-10-CM

## 2023-10-10 DIAGNOSIS — D472 Monoclonal gammopathy: Secondary | ICD-10-CM | POA: Diagnosis present

## 2023-10-10 DIAGNOSIS — Z79899 Other long term (current) drug therapy: Secondary | ICD-10-CM | POA: Diagnosis not present

## 2023-10-10 DIAGNOSIS — C439 Malignant melanoma of skin, unspecified: Secondary | ICD-10-CM | POA: Insufficient documentation

## 2023-10-10 DIAGNOSIS — Z833 Family history of diabetes mellitus: Secondary | ICD-10-CM | POA: Diagnosis not present

## 2023-10-10 DIAGNOSIS — Z85820 Personal history of malignant melanoma of skin: Secondary | ICD-10-CM | POA: Insufficient documentation

## 2023-10-10 DIAGNOSIS — E119 Type 2 diabetes mellitus without complications: Secondary | ICD-10-CM | POA: Insufficient documentation

## 2023-10-10 DIAGNOSIS — Z823 Family history of stroke: Secondary | ICD-10-CM | POA: Insufficient documentation

## 2023-10-10 DIAGNOSIS — Z86718 Personal history of other venous thrombosis and embolism: Secondary | ICD-10-CM | POA: Diagnosis not present

## 2023-10-10 DIAGNOSIS — E8581 Light chain (AL) amyloidosis: Secondary | ICD-10-CM | POA: Diagnosis present

## 2023-10-10 LAB — CBC WITH DIFFERENTIAL (CANCER CENTER ONLY)
Abs Immature Granulocytes: 0.02 10*3/uL (ref 0.00–0.07)
Basophils Absolute: 0 10*3/uL (ref 0.0–0.1)
Basophils Relative: 0 %
Eosinophils Absolute: 0.3 10*3/uL (ref 0.0–0.5)
Eosinophils Relative: 4 %
HCT: 39.4 % (ref 39.0–52.0)
Hemoglobin: 12.9 g/dL — ABNORMAL LOW (ref 13.0–17.0)
Immature Granulocytes: 0 %
Lymphocytes Relative: 26 %
Lymphs Abs: 1.9 10*3/uL (ref 0.7–4.0)
MCH: 29.3 pg (ref 26.0–34.0)
MCHC: 32.7 g/dL (ref 30.0–36.0)
MCV: 89.3 fL (ref 80.0–100.0)
Monocytes Absolute: 0.4 10*3/uL (ref 0.1–1.0)
Monocytes Relative: 5 %
Neutro Abs: 4.8 10*3/uL (ref 1.7–7.7)
Neutrophils Relative %: 65 %
Platelet Count: 252 10*3/uL (ref 150–400)
RBC: 4.41 MIL/uL (ref 4.22–5.81)
RDW: 13.4 % (ref 11.5–15.5)
WBC Count: 7.5 10*3/uL (ref 4.0–10.5)
nRBC: 0 % (ref 0.0–0.2)

## 2023-10-10 LAB — CMP (CANCER CENTER ONLY)
ALT: 9 U/L (ref 0–44)
AST: 11 U/L — ABNORMAL LOW (ref 15–41)
Albumin: 4 g/dL (ref 3.5–5.0)
Alkaline Phosphatase: 115 U/L (ref 38–126)
Anion gap: 10 (ref 5–15)
BUN: 12 mg/dL (ref 8–23)
CO2: 27 mmol/L (ref 22–32)
Calcium: 9.8 mg/dL (ref 8.9–10.3)
Chloride: 101 mmol/L (ref 98–111)
Creatinine: 0.74 mg/dL (ref 0.61–1.24)
GFR, Estimated: >60 mL/min (ref >60)
Glucose, Bld: 208 mg/dL — ABNORMAL HIGH (ref 70–99)
Potassium: 3.9 mmol/L (ref 3.5–5.1)
Sodium: 138 mmol/L (ref 135–145)
Total Bilirubin: 0.8 mg/dL (ref 0.0–1.2)
Total Protein: 7.2 g/dL (ref 6.5–8.1)

## 2023-10-10 LAB — LACTATE DEHYDROGENASE: LDH: 164 U/L (ref 98–192)

## 2023-10-10 NOTE — Assessment & Plan Note (Signed)
Repeat CBC, CMP, free light chains, immunoglobulin panel and SPEP today.

## 2023-10-10 NOTE — Assessment & Plan Note (Signed)
CBC, CMP, LDH Recommend repeat PET for surveillance Lifelong follow up with dermatology for [facility]

## 2023-10-11 LAB — IGG, IGA, IGM
IgA: 526 mg/dL — ABNORMAL HIGH (ref 61–437)
IgG (Immunoglobin G), Serum: 883 mg/dL (ref 603–1613)
IgM (Immunoglobulin M), Srm: 42 mg/dL (ref 15–143)

## 2023-10-11 LAB — KAPPA/LAMBDA LIGHT CHAINS
Kappa free light chain: 38.4 mg/L — ABNORMAL HIGH (ref 3.3–19.4)
Kappa, lambda light chain ratio: 1.74 — ABNORMAL HIGH (ref 0.26–1.65)
Lambda free light chains: 22.1 mg/L (ref 5.7–26.3)

## 2023-10-16 LAB — PROTEIN ELECTROPHORESIS, SERUM, WITH REFLEX
A/G Ratio: 1.2 (ref 0.7–1.7)
Albumin ELP: 3.7 g/dL (ref 2.9–4.4)
Alpha-1-Globulin: 0.3 g/dL (ref 0.0–0.4)
Alpha-2-Globulin: 0.9 g/dL (ref 0.4–1.0)
Beta Globulin: 1.4 g/dL — ABNORMAL HIGH (ref 0.7–1.3)
Gamma Globulin: 0.7 g/dL (ref 0.4–1.8)
Globulin, Total: 3.2 g/dL (ref 2.2–3.9)
Total Protein ELP: 6.9 g/dL (ref 6.0–8.5)

## 2023-10-17 ENCOUNTER — Other Ambulatory Visit: Payer: Self-pay

## 2023-10-17 ENCOUNTER — Telehealth: Payer: Self-pay

## 2023-10-17 DIAGNOSIS — D472 Monoclonal gammopathy: Secondary | ICD-10-CM

## 2023-10-17 NOTE — Telephone Encounter (Signed)
-----   Message from Melven Sartorius sent at 10/17/2023  1:09 PM EST ----- Angelica Chessman please let him know. No concerning changes from his lab. Recommend follow up repeat lab in mid April and see me 2 weeks after blood draw. If PET showed concerning findings, will follow up earlier. Thanks.

## 2023-10-17 NOTE — Telephone Encounter (Signed)
TC to inform of the below message by Dr. Cherly Hensen. Pt verbalizes understanding. Scheduling notified to set up these appointments.

## 2023-10-22 NOTE — Progress Notes (Deleted)
 Cardiology Office Note:  .   Date:  10/22/2023  ID:  Micheal Moore Micheal Moore., DOB 11-13-1950, MRN [id number] PCP: Nichole Senior, MD  [facility] Providers Cardiologist:  Vina Gull, MD {  History of Present Illness: .   Micheal E Rowlands Jr. is a 73 y.o. male with a past medical history of dizziness CAD S/P PCI in April and 1 episode of syncope here for follow-up appointment.  Last saw Dr. Gull March 2023.  He has syncope spells that started about a year ago.  1-2 spells a week.  When not having spells he feels okay.  He states on days where he is dizzy and get dizzy several times and syncopal spells occur when he bent down to pick up something like his dog.  Maybe passed out at that time. Denies numbness in limbs.  Bowels moving okay.  No nausea.  States that he does drink adequate fluids.  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  He is currently residing at [facility] rehab facility. He reports confusion about his medications, particularly his statin. He was previously taking rosuvastatin  twice a week at home, but the facility list indicates atorvastatin . He is unsure what he is currently receiving at the facility. He confirms taking Brilinta  and Jardiance  for diabetes. He also takes aspirin  daily.  The patient reports occasional dizziness, particularly upon standing, but denies any episodes of passing out. He recalls an incident at a previous job where he fell due to heat and woke up with the ambulance present, but he maintains consciousness was not lost.  His diabetes management includes Jardiance , Lantus  injections (20 units at night), and 8-10 units of insulin  with meals. He reports his blood sugar levels have been high, with readings over 150 and even reaching 300 at times. He is currently on a diet provided by the rehab facility.  The  patient also reports pain and swelling in his remaining leg, which he attributes to attempts at standing during physical therapy. He has not yet been fitted for a prosthetic. He already had follow-up with Dr. Harden and he confirmed the amputation site was healing well.  Reports no shortness of breath nor dyspnea on exertion. Reports no chest pain, pressure, or tightness. No edema, orthopnea, PND. Reports no palpitations.  Today, he***   ROS: Pertinent ROS in HPI  Studies Reviewed: SABRA       TEE 04/19/2023 IMPRESSIONS     1. Left ventricular ejection fraction, by estimation, is 55 to 60%. The  left ventricle has normal function. The left ventricle has no regional  wall motion abnormalities. There is moderate left ventricular hypertrophy.   2. Right ventricular systolic function is normal. The right ventricular  size is normal.   3. No left atrial/left atrial appendage thrombus was detected.   4. The mitral valve is degenerative. Trivial mitral valve regurgitation.   5. The aortic valve is tricuspid. Aortic valve regurgitation is not  visualized. No aortic stenosis is present.   Conclusion(s)/Recommendation(s): No evidence of vegetation/infective  endocarditis on this transesophageael echocardiogram.   FINDINGS   Left Ventricle: Left ventricular ejection fraction, by estimation, is 55  to 60%. The left ventricle has normal function. The left ventricle has no  regional wall motion abnormalities. The left ventricular internal cavity  size was normal in size. There is   moderate left ventricular hypertrophy.  Right Ventricle: The right ventricular size is normal. No increase in  right ventricular wall thickness. Right ventricular systolic function is  normal.   Left Atrium: Left atrial size was normal in size. No left atrial/left  atrial appendage thrombus was detected.   Right Atrium: Right atrial size was normal in size.   Pericardium: There is no evidence of pericardial  effusion.   Mitral Valve: The mitral valve is degenerative in appearance. Trivial  mitral valve regurgitation.   Tricuspid Valve: The tricuspid valve is grossly normal. Tricuspid valve  regurgitation is not demonstrated.   Aortic Valve: The aortic valve is tricuspid. Aortic valve regurgitation is  not visualized. No aortic stenosis is present. Aortic valve mean gradient  measures 2.0 mmHg. Aortic valve peak gradient measures 3.5 mmHg.   Pulmonic Valve: The pulmonic valve was normal in structure. Pulmonic valve  regurgitation is trivial. No evidence of pulmonic stenosis.   Aorta: The aortic root is normal in size and structure.   IAS/Shunts: No atrial level shunt detected by color flow Doppler.   Additional Comments: Spectral Doppler performed.       Physical Exam:   VS:  There were no vitals taken for this visit.   Wt Readings from Last 3 Encounters:  07/23/23 196 lb (88.9 kg)  06/28/23 195 lb (88.5 kg)  04/24/23 190 lb (86.2 kg)    GEN: Well nourished, well developed in no acute distress NECK: No JVD; No carotid bruits CARDIAC: RRR, no murmurs, rubs, gallops RESPIRATORY:  Clear to auscultation without rales, wheezing or rhonchi  ABDOMEN: Soft, non-tender, non-distended EXTREMITIES:  No edema; No deformity   ASSESSMENT AND PLAN: .    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 No current signs of infection. Patient reports pain and swelling. -Continue current pain management plan. -Encourage physical therapy for mobility and strength.  Hyperlipidemia Patient on multiple cardiac medications including aspirin , Brilinta , and a statin (discrepancy between atorvastatin  and rosuvastatin ). -Clarify statin medication with rehab facility and ensure correct medication is being administered.  CAD s/p PCI in April 2024 -Add Brilinta  to medication list. He is currently on DAPT with asa and brilinta  at the facility  Diabetes Mellitus Patient on Jardiance  and  insulin  regimen. Recent A1c was 8.4, indicating suboptimal control. -Continue current medication regimen. -Encourage regular blood glucose monitoring and adherence to diabetic diet. -Check A1c at next visit.  Post-Operative Care Patient to be discharged from rehab facility soon. -Ensure patient receives clear discharge instructions regarding medication regimen. -Ensure patient has enough medication upon discharge.      Dispo: He can follow-up with Dr. Okey in 3 months  Signed, Orren LOISE Fabry, PA-C

## 2023-10-23 ENCOUNTER — Ambulatory Visit: Payer: Medicare Other | Attending: Physician Assistant | Admitting: Physician Assistant

## 2023-10-23 DIAGNOSIS — Z89412 Acquired absence of left great toe: Secondary | ICD-10-CM

## 2023-10-23 DIAGNOSIS — E1169 Type 2 diabetes mellitus with other specified complication: Secondary | ICD-10-CM

## 2023-10-23 DIAGNOSIS — E785 Hyperlipidemia, unspecified: Secondary | ICD-10-CM

## 2023-10-23 DIAGNOSIS — I2510 Atherosclerotic heart disease of native coronary artery without angina pectoris: Secondary | ICD-10-CM

## 2023-10-23 DIAGNOSIS — E1110 Type 2 diabetes mellitus with ketoacidosis without coma: Secondary | ICD-10-CM

## 2023-10-24 ENCOUNTER — Encounter: Payer: Self-pay | Admitting: Physician Assistant

## 2023-10-30 DIAGNOSIS — E1169 Type 2 diabetes mellitus with other specified complication: Secondary | ICD-10-CM | POA: Diagnosis not present

## 2023-10-30 DIAGNOSIS — E1151 Type 2 diabetes mellitus with diabetic peripheral angiopathy without gangrene: Secondary | ICD-10-CM | POA: Diagnosis not present

## 2023-10-30 DIAGNOSIS — E440 Moderate protein-calorie malnutrition: Secondary | ICD-10-CM | POA: Diagnosis not present

## 2023-10-30 DIAGNOSIS — Z4781 Encounter for orthopedic aftercare following surgical amputation: Secondary | ICD-10-CM | POA: Diagnosis not present

## 2023-10-30 DIAGNOSIS — I2510 Atherosclerotic heart disease of native coronary artery without angina pectoris: Secondary | ICD-10-CM | POA: Diagnosis not present

## 2023-10-30 DIAGNOSIS — K5900 Constipation, unspecified: Secondary | ICD-10-CM | POA: Diagnosis not present

## 2023-10-30 DIAGNOSIS — E785 Hyperlipidemia, unspecified: Secondary | ICD-10-CM | POA: Diagnosis not present

## 2023-10-30 DIAGNOSIS — I5032 Chronic diastolic (congestive) heart failure: Secondary | ICD-10-CM | POA: Diagnosis not present

## 2023-10-30 DIAGNOSIS — I951 Orthostatic hypotension: Secondary | ICD-10-CM | POA: Diagnosis not present

## 2023-11-01 ENCOUNTER — Ambulatory Visit (HOSPITAL_COMMUNITY): Payer: Medicare HMO

## 2023-11-03 ENCOUNTER — Other Ambulatory Visit: Payer: Self-pay

## 2023-11-03 ENCOUNTER — Emergency Department (HOSPITAL_COMMUNITY)
Admission: EM | Admit: 2023-11-03 | Discharge: 2023-11-04 | Disposition: A | Discharge status: Home / Self Care | Payer: Medicare HMO | Attending: Emergency Medicine | Admitting: Emergency Medicine

## 2023-11-03 ENCOUNTER — Emergency Department (HOSPITAL_COMMUNITY): Payer: Medicare HMO

## 2023-11-03 ENCOUNTER — Encounter (HOSPITAL_COMMUNITY): Payer: Self-pay

## 2023-11-03 DIAGNOSIS — Z96643 Presence of artificial hip joint, bilateral: Secondary | ICD-10-CM | POA: Diagnosis not present

## 2023-11-03 DIAGNOSIS — R739 Hyperglycemia, unspecified: Secondary | ICD-10-CM | POA: Diagnosis not present

## 2023-11-03 DIAGNOSIS — Z89612 Acquired absence of left leg above knee: Secondary | ICD-10-CM | POA: Diagnosis not present

## 2023-11-03 DIAGNOSIS — Z794 Long term (current) use of insulin: Secondary | ICD-10-CM | POA: Insufficient documentation

## 2023-11-03 DIAGNOSIS — I10 Essential (primary) hypertension: Secondary | ICD-10-CM | POA: Diagnosis not present

## 2023-11-03 DIAGNOSIS — E119 Type 2 diabetes mellitus without complications: Secondary | ICD-10-CM | POA: Insufficient documentation

## 2023-11-03 DIAGNOSIS — I700 Atherosclerosis of aorta: Secondary | ICD-10-CM | POA: Diagnosis not present

## 2023-11-03 DIAGNOSIS — Z7982 Long term (current) use of aspirin: Secondary | ICD-10-CM | POA: Insufficient documentation

## 2023-11-03 DIAGNOSIS — Z85820 Personal history of malignant melanoma of skin: Secondary | ICD-10-CM | POA: Diagnosis not present

## 2023-11-03 DIAGNOSIS — R059 Cough, unspecified: Secondary | ICD-10-CM | POA: Diagnosis not present

## 2023-11-03 DIAGNOSIS — R112 Nausea with vomiting, unspecified: Secondary | ICD-10-CM | POA: Diagnosis present

## 2023-11-03 DIAGNOSIS — Z7984 Long term (current) use of oral hypoglycemic drugs: Secondary | ICD-10-CM | POA: Diagnosis not present

## 2023-11-03 DIAGNOSIS — I5032 Chronic diastolic (congestive) heart failure: Secondary | ICD-10-CM | POA: Insufficient documentation

## 2023-11-03 DIAGNOSIS — R1111 Vomiting without nausea: Secondary | ICD-10-CM | POA: Diagnosis not present

## 2023-11-03 DIAGNOSIS — R000 Tachycardia, unspecified: Secondary | ICD-10-CM | POA: Diagnosis not present

## 2023-11-03 DIAGNOSIS — I2510 Atherosclerotic heart disease of native coronary artery without angina pectoris: Secondary | ICD-10-CM | POA: Insufficient documentation

## 2023-11-03 LAB — I-STAT CHEM 8, ED
BUN: 15 mg/dL (ref 8–23)
Calcium, Ion: 1.11 mmol/L — ABNORMAL LOW (ref 1.15–1.40)
Chloride: 100 mmol/L (ref 98–111)
Creatinine, Ser: 0.7 mg/dL (ref 0.61–1.24)
Glucose, Bld: 335 mg/dL — ABNORMAL HIGH (ref 70–99)
HCT: 38 % — ABNORMAL LOW (ref 39.0–52.0)
Hemoglobin: 12.9 g/dL — ABNORMAL LOW (ref 13.0–17.0)
Potassium: 3.9 mmol/L (ref 3.5–5.1)
Sodium: 136 mmol/L (ref 135–145)
TCO2: 25 mmol/L (ref 22–32)

## 2023-11-03 LAB — CBC
HCT: 37.7 % — ABNORMAL LOW (ref 39.0–52.0)
Hemoglobin: 12.3 g/dL — ABNORMAL LOW (ref 13.0–17.0)
MCH: 29.4 pg (ref 26.0–34.0)
MCHC: 32.6 g/dL (ref 30.0–36.0)
MCV: 90 fL (ref 80.0–100.0)
Platelets: 311 10*3/uL (ref 150–400)
RBC: 4.19 MIL/uL — ABNORMAL LOW (ref 4.22–5.81)
RDW: 13.4 % (ref 11.5–15.5)
WBC: 14.4 10*3/uL — ABNORMAL HIGH (ref 4.0–10.5)
nRBC: 0 % (ref 0.0–0.2)

## 2023-11-03 LAB — CBG MONITORING, ED: Glucose-Capillary: 289 mg/dL — ABNORMAL HIGH (ref 70–99)

## 2023-11-03 MED ORDER — SODIUM CHLORIDE 0.9 % IV BOLUS
1000.0000 mL | Freq: Once | INTRAVENOUS | Status: AC
  Administered 2023-11-03: 1000 mL via INTRAVENOUS

## 2023-11-03 MED ORDER — PROCHLORPERAZINE EDISYLATE 10 MG/2ML IJ SOLN
10.0000 mg | Freq: Once | INTRAMUSCULAR | Status: AC
  Administered 2023-11-03: 10 mg via INTRAVENOUS
  Filled 2023-11-03: qty 2

## 2023-11-03 NOTE — ED Notes (Signed)
Pt advises being unable to void for UA, will continue to encourage and check following bolus completion.

## 2023-11-03 NOTE — ED Triage Notes (Signed)
Pt. BIB [facility] from home for nausea and vomiting. Pt. Is a cancer patient. Wife unable to take care of him per EMS. Alert and oriented to self only per EMS.

## 2023-11-03 NOTE — ED Provider Notes (Signed)
EMERGENCY DEPARTMENT AT [facility] Provider Note  CSN: [id number] Arrival date & time: 11/03/23 2150  Chief Complaint(s) Nausea  HPI Micheal Moore. is a 73 y.o. male {Add pertinent medical, surgical, social history, OB history to HPI:1}   The history is provided by the patient.    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   MRSA infection 06/24/2023   History of MDR Pseudomonas aeruginosa infection 06/24/2023   Osteomyelitis (HCC) 06/19/2023   Ambulatory dysfunction 05/01/2023   Weakness 04/24/2023   Medication management 04/16/2023   Bacteremia 04/12/2023   Aspiration pneumonia (HCC) 04/11/2023   Pressure injury of skin 04/11/2023   Nausea and vomiting 04/11/2023   Elevated troponin 04/11/2023   CAD (coronary artery disease) 04/11/2023   Hypokalemia 04/11/2023   PVD (peripheral vascular disease) (HCC) 04/11/2023   Debility 04/11/2023   Hyperglycemia 01/16/2023   STEMI (ST elevation myocardial infarction) (HCC) 01/15/2023   Diabetic ketoacidosis without coma associated with type 2 diabetes mellitus (HCC) 01/15/2023   Anterior dislocation of left hip (HCC) 08/24/2022   Closed dislocation of left hip (HCC) 08/23/2022   Normocytic anemia 08/23/2022   Protein-calorie malnutrition, moderate (HCC) 07/20/2022   Fall    Orthostatic hypotension    Hip fracture (HCC) 02/22/2022   Hyperlipidemia 02/22/2022   Chronic diastolic CHF (congestive heart failure) (HCC) 02/22/2022   Left great toe amputee (HCC) 05/06/2020   Cellulitis in diabetic foot (HCC) 04/27/2020   Osteomyelitis of great toe of left foot (HCC) 04/27/2020   Type 2  diabetes mellitus with hyperlipidemia (HCC) 04/27/2020   History of DVT (deep vein thrombosis) 04/27/2020   Tobacco abuse 04/27/2020   Home Medication(s) Prior to Admission medications   Medication Sig Start Date End Date Taking? Authorizing Provider  acetaminophen (TYLENOL) 325 MG tablet Take 2 tablets (650 mg total) by mouth every 6 (six) hours as needed for mild pain, fever or headache. 04/22/23   Lonia Blood, MD  ascorbic acid (VITAMIN C) 1000 MG tablet Take 1 tablet (1,000 mg total) by mouth daily. 06/30/23   Amin, Ankit C, MD  aspirin EC 81 MG tablet Take 1 tablet (81 mg total) by mouth daily. Swallow whole. 01/20/23   Lanae Boast, MD  atorvastatin (LIPITOR) 10 MG tablet One tablet by mouth twice a week    [provider]  barrier cream (NON-SPECIFIED) CREA Apply 1 Application topically 2 (two) times daily as needed. 05/04/23   Standley Brooking, MD  bisacodyl (DULCOLAX) 5 MG EC tablet Take 1 tablet (5 mg total) by mouth daily as needed for moderate constipation or severe constipation. 06/30/23   Amin, Ankit C, MD  Blood Glucose Monitoring Suppl DEVI 1 each by Does not apply route 3 (three) times daily. May dispense any manufacturer covered by patient's insurance. 01/20/23   Lanae Boast, MD  BRILINTA 90 MG TABS tablet Take 90 mg by mouth 2 (two) times daily. 04/09/23   [provider]  docusate sodium (COLACE) 100 MG capsule Take 1 capsule (100 mg total) by mouth daily. 06/30/23   Amin, Ankit  C, MD  Ensure Max Protein (ENSURE MAX PROTEIN) LIQD Take 330 mLs (11 oz total) by mouth 2 (two) times daily. Patient taking differently: Take 11 oz by mouth daily. 08/28/22   Lorin Glass, MD  fludrocortisone (FLORINEF) 0.1 MG tablet Take 2 tablets (0.2 mg total) by mouth daily. 06/30/23   Amin, Ankit C, MD  Glucagon, rDNA, (GLUCAGON EMERGENCY) 1 MG KIT Inject 1 mg into the skin as needed for up to 2 doses (Severe low blood sugar). 01/20/23   Lanae Boast, MD  Glucose Blood (BLOOD GLUCOSE  TEST STRIPS) STRP 1 each by Does not apply route 3 (three) times daily. Use as directed to check blood sugar. May dispense any manufacturer covered by patient's insurance and fits patient's device. 01/20/23   Lanae Boast, MD  insulin aspart (NOVOLOG) 100 UNIT/ML FlexPen Inject 8 Units into the skin 3 (three) times daily with meals. Only take if eating a meal AND Blood Glucose (BG) is 80 or higher. 04/22/23 07/23/23  Lonia Blood, MD  insulin detemir (LEVEMIR) 100 UNIT/ML FlexPen Inject 20 Units into the skin at bedtime. DO NOT TAKE if your blood sugars and consistently running < 120 04/23/23   Lonia Blood, MD  Insulin Pen Needle (PEN NEEDLES) 31G X 5 MM MISC 1 each by Does not apply route 3 (three) times daily. May dispense any manufacturer covered by patient's insurance. 01/20/23   Lanae Boast, MD  JARDIANCE 10 MG TABS tablet Take 10 mg by mouth daily. 04/09/23   [provider]  Lancet Device MISC 1 each by Does not apply route 3 (three) times daily. May dispense any manufacturer covered by patient's insurance. 01/20/23   Lanae Boast, MD  Lancets MISC 1 each by Does not apply route 3 (three) times daily. Use as directed to check blood sugar. May dispense any manufacturer covered by patient's insurance and fits patient's device. 01/20/23   Lanae Boast, MD  magnesium citrate SOLN Take 296 mLs (1 Bottle total) by mouth daily as needed for severe constipation or moderate constipation. 07/05/23   Dorcas Carrow, MD  oxyCODONE (OXY IR/ROXICODONE) 5 MG immediate release tablet Take 1-2 tablets (5-10 mg total) by mouth every 4 (four) hours as needed for moderate pain, severe pain or breakthrough pain (pain score 4-6). 06/30/23   Amin, Ankit C, MD  polyethylene glycol (MIRALAX / GLYCOLAX) 17 g packet Take 17 g by mouth daily. 07/05/23   Dorcas Carrow, MD  senna (SENOKOT) 8.6 MG TABS tablet Take 1 tablet (8.6 mg total) by mouth 2 (two) times daily as needed for mild constipation or moderate constipation.  06/30/23   Amin, Ankit C, MD  tamsulosin (FLOMAX) 0.4 MG CAPS capsule Take 1 capsule (0.4 mg total) by mouth daily after supper. 06/30/23   Miguel Rota, MD  Allergies Patient has no known allergies.  Review of Systems Review of Systems As noted in HPI  Physical Exam Vital Signs  I have reviewed the triage vital signs BP (!) 147/80 (BP Location: Left Arm)   Pulse (!) 117   Temp 98.6 F (37 C) (Oral)   Resp (!) 21   SpO2 97%  *** Physical Exam Vitals reviewed.  Constitutional:      General: He is not in acute distress.    Appearance: He is well-developed. He is not diaphoretic.  HENT:     Head: Normocephalic and atraumatic.     Right Ear: External ear normal.     Left Ear: External ear normal.     Nose: Nose normal.     Mouth/Throat:     Mouth: Mucous membranes are moist.  Eyes:     General: No scleral icterus.    Conjunctiva/sclera: Conjunctivae normal.  Neck:     Trachea: Phonation normal.  Cardiovascular:     Rate and Rhythm: Normal rate and regular rhythm.  Pulmonary:     Effort: Pulmonary effort is normal. No respiratory distress.     Breath sounds: No stridor.  Abdominal:     General: There is no distension.     Tenderness: There is no abdominal tenderness.  Musculoskeletal:        General: Normal range of motion.     Cervical back: Normal range of motion.     Left Lower Extremity: Left leg is amputated above knee.  Neurological:     Mental Status: He is alert and oriented to person, place, and time.  Psychiatric:        Behavior: Behavior normal.     ED Results and Treatments Labs (all labs ordered are listed, but only abnormal results are displayed) Labs Reviewed  LIPASE, BLOOD  COMPREHENSIVE METABOLIC PANEL  CBC  URINALYSIS, ROUTINE W REFLEX MICROSCOPIC                                                                                                                          EKG  EKG Interpretation Date/Time:    Ventricular Rate:    PR Interval:    QRS Duration:    QT Interval:    QTC Calculation:   R Axis:      Text Interpretation:         Radiology No results found.  Medications Ordered in ED Medications  prochlorperazine (COMPAZINE) injection 10 mg (has no administration in time range)  sodium chloride 0.9 % bolus 1,000 mL (has no administration in time range)   Procedures Procedures  (including critical care time) Medical Decision Making / ED Course   Medical Decision Making Risk Prescription drug management.    ***    Final Clinical Impression(s) / ED Diagnoses Final diagnoses:  None    This chart was dictated using voice recognition software.  Despite best efforts to proofread,  errors can occur which can change the documentation meaning.

## 2023-11-04 ENCOUNTER — Emergency Department (HOSPITAL_COMMUNITY): Payer: Medicare HMO

## 2023-11-04 DIAGNOSIS — R531 Weakness: Secondary | ICD-10-CM | POA: Diagnosis not present

## 2023-11-04 DIAGNOSIS — R059 Cough, unspecified: Secondary | ICD-10-CM | POA: Diagnosis not present

## 2023-11-04 DIAGNOSIS — L0390 Cellulitis, unspecified: Secondary | ICD-10-CM | POA: Diagnosis not present

## 2023-11-04 DIAGNOSIS — Z85820 Personal history of malignant melanoma of skin: Secondary | ICD-10-CM | POA: Diagnosis not present

## 2023-11-04 DIAGNOSIS — R112 Nausea with vomiting, unspecified: Secondary | ICD-10-CM | POA: Diagnosis not present

## 2023-11-04 DIAGNOSIS — Z7401 Bed confinement status: Secondary | ICD-10-CM | POA: Diagnosis not present

## 2023-11-04 LAB — URINALYSIS, ROUTINE W REFLEX MICROSCOPIC
Bacteria, UA: NONE SEEN
Bilirubin Urine: NEGATIVE
Glucose, UA: >=500 mg/dL — AB
Ketones, ur: NEGATIVE mg/dL
Leukocytes,Ua: NEGATIVE
Nitrite: NEGATIVE
Protein, ur: NEGATIVE mg/dL
Specific Gravity, Urine: 1.036 — ABNORMAL HIGH (ref 1.005–1.030)
pH: 5 (ref 5.0–8.0)

## 2023-11-04 LAB — COMPREHENSIVE METABOLIC PANEL
ALT: 9 U/L (ref 0–44)
AST: 11 U/L — ABNORMAL LOW (ref 15–41)
Albumin: 3.4 g/dL — ABNORMAL LOW (ref 3.5–5.0)
Alkaline Phosphatase: 93 U/L (ref 38–126)
Anion gap: 10 (ref 5–15)
BUN: 15 mg/dL (ref 8–23)
CO2: 25 mmol/L (ref 22–32)
Calcium: 8.9 mg/dL (ref 8.9–10.3)
Chloride: 99 mmol/L (ref 98–111)
Creatinine, Ser: 0.73 mg/dL (ref 0.61–1.24)
GFR, Estimated: >60 mL/min (ref >60)
Glucose, Bld: 337 mg/dL — ABNORMAL HIGH (ref 70–99)
Potassium: 3.8 mmol/L (ref 3.5–5.1)
Sodium: 134 mmol/L — ABNORMAL LOW (ref 135–145)
Total Bilirubin: 1.1 mg/dL (ref 0.0–1.2)
Total Protein: 7.5 g/dL (ref 6.5–8.1)

## 2023-11-04 LAB — BLOOD GAS, VENOUS
Acid-Base Excess: 1.9 mmol/L (ref 0.0–2.0)
Bicarbonate: 26.6 mmol/L (ref 20.0–28.0)
O2 Saturation: 88.1 %
Patient temperature: 37
pCO2, Ven: 41 mm[Hg] — ABNORMAL LOW (ref 44–60)
pH, Ven: 7.42 (ref 7.25–7.43)
pO2, Ven: 53 mm[Hg] — ABNORMAL HIGH (ref 32–45)

## 2023-11-04 LAB — RESP PANEL BY RT-PCR (RSV, FLU A&B, COVID)  RVPGX2
Influenza A by PCR: NEGATIVE
Influenza B by PCR: NEGATIVE
Resp Syncytial Virus by PCR: NEGATIVE
SARS Coronavirus 2 by RT PCR: NEGATIVE

## 2023-11-04 LAB — TROPONIN I (HIGH SENSITIVITY)
Troponin I (High Sensitivity): 12 ng/L (ref <18)
Troponin I (High Sensitivity): 9 ng/L (ref <18)

## 2023-11-04 LAB — LIPASE, BLOOD: Lipase: 22 U/L (ref 11–51)

## 2023-11-04 MED ORDER — IOHEXOL 300 MG/ML  SOLN
100.0000 mL | Freq: Once | INTRAMUSCULAR | Status: AC | PRN
  Administered 2023-11-04: 100 mL via INTRAVENOUS

## 2023-11-04 MED ORDER — ONDANSETRON 4 MG PO TBDP: 4.0000 mg | ORAL_TABLET | Freq: Three times a day (TID) | ORAL | 0 refills | Status: AC | PRN

## 2023-11-04 MED ORDER — SODIUM CHLORIDE (PF) 0.9 % IJ SOLN
INTRAMUSCULAR | Status: AC
  Filled 2023-11-04: qty 50

## 2023-11-04 NOTE — ED Notes (Signed)
Pt again, said he cannot void at this time, will continue to encourage.

## 2023-11-04 NOTE — ED Notes (Signed)
Pt tolerated PO drinking water without any n/v.

## 2023-11-04 NOTE — ED Notes (Signed)
 [facility] called for transport home.

## 2023-11-04 NOTE — ED Notes (Signed)
 Pt attempted to void without success.

## 2023-11-04 NOTE — ED Notes (Signed)
 Pt transport to CT

## 2023-11-13 DIAGNOSIS — R1112 Projectile vomiting: Secondary | ICD-10-CM | POA: Diagnosis not present

## 2023-11-13 DIAGNOSIS — L97515 Non-pressure chronic ulcer of other part of right foot with muscle involvement without evidence of necrosis: Secondary | ICD-10-CM | POA: Diagnosis not present

## 2023-11-13 DIAGNOSIS — R296 Repeated falls: Secondary | ICD-10-CM | POA: Diagnosis not present

## 2023-11-13 DIAGNOSIS — C4361 Malignant melanoma of right upper limb, including shoulder: Secondary | ICD-10-CM | POA: Diagnosis not present

## 2023-11-13 DIAGNOSIS — E785 Hyperlipidemia, unspecified: Secondary | ICD-10-CM | POA: Diagnosis not present

## 2023-11-13 DIAGNOSIS — I739 Peripheral vascular disease, unspecified: Secondary | ICD-10-CM | POA: Diagnosis not present

## 2023-11-13 DIAGNOSIS — R109 Unspecified abdominal pain: Secondary | ICD-10-CM | POA: Diagnosis not present

## 2023-11-13 DIAGNOSIS — S98112A Complete traumatic amputation of left great toe, initial encounter: Secondary | ICD-10-CM | POA: Diagnosis not present

## 2023-11-13 DIAGNOSIS — E1140 Type 2 diabetes mellitus with diabetic neuropathy, unspecified: Secondary | ICD-10-CM | POA: Diagnosis not present

## 2023-11-13 DIAGNOSIS — E11621 Type 2 diabetes mellitus with foot ulcer: Secondary | ICD-10-CM | POA: Diagnosis not present

## 2023-11-15 ENCOUNTER — Ambulatory Visit (HOSPITAL_COMMUNITY): Payer: Medicare HMO

## 2023-11-18 DIAGNOSIS — I5032 Chronic diastolic (congestive) heart failure: Secondary | ICD-10-CM | POA: Diagnosis not present

## 2023-11-18 DIAGNOSIS — Z4781 Encounter for orthopedic aftercare following surgical amputation: Secondary | ICD-10-CM | POA: Diagnosis not present

## 2023-11-18 DIAGNOSIS — E1169 Type 2 diabetes mellitus with other specified complication: Secondary | ICD-10-CM | POA: Diagnosis not present

## 2023-11-18 DIAGNOSIS — E1151 Type 2 diabetes mellitus with diabetic peripheral angiopathy without gangrene: Secondary | ICD-10-CM | POA: Diagnosis not present

## 2023-11-18 DIAGNOSIS — K5900 Constipation, unspecified: Secondary | ICD-10-CM | POA: Diagnosis not present

## 2023-11-18 DIAGNOSIS — E785 Hyperlipidemia, unspecified: Secondary | ICD-10-CM | POA: Diagnosis not present

## 2023-11-18 DIAGNOSIS — I951 Orthostatic hypotension: Secondary | ICD-10-CM | POA: Diagnosis not present

## 2023-11-18 DIAGNOSIS — I2510 Atherosclerotic heart disease of native coronary artery without angina pectoris: Secondary | ICD-10-CM | POA: Diagnosis not present

## 2023-11-18 DIAGNOSIS — E440 Moderate protein-calorie malnutrition: Secondary | ICD-10-CM | POA: Diagnosis not present

## 2023-11-19 ENCOUNTER — Ambulatory Visit: Admitting: Orthopedic Surgery

## 2023-12-02 ENCOUNTER — Ambulatory Visit: Admitting: Orthopedic Surgery

## 2023-12-02 ENCOUNTER — Emergency Department (HOSPITAL_COMMUNITY)

## 2023-12-02 ENCOUNTER — Other Ambulatory Visit: Payer: Self-pay

## 2023-12-02 ENCOUNTER — Encounter (HOSPITAL_COMMUNITY): Payer: Self-pay

## 2023-12-02 ENCOUNTER — Emergency Department (HOSPITAL_COMMUNITY)
Admission: EM | Admit: 2023-12-02 | Discharge: 2023-12-03 | Disposition: A | Discharge status: Home / Self Care | Attending: Student | Admitting: Student

## 2023-12-02 DIAGNOSIS — I5032 Chronic diastolic (congestive) heart failure: Secondary | ICD-10-CM | POA: Diagnosis not present

## 2023-12-02 DIAGNOSIS — Z794 Long term (current) use of insulin: Secondary | ICD-10-CM | POA: Diagnosis not present

## 2023-12-02 DIAGNOSIS — Z7982 Long term (current) use of aspirin: Secondary | ICD-10-CM | POA: Insufficient documentation

## 2023-12-02 DIAGNOSIS — I2510 Atherosclerotic heart disease of native coronary artery without angina pectoris: Secondary | ICD-10-CM | POA: Diagnosis not present

## 2023-12-02 DIAGNOSIS — R10817 Generalized abdominal tenderness: Secondary | ICD-10-CM | POA: Diagnosis not present

## 2023-12-02 DIAGNOSIS — F17210 Nicotine dependence, cigarettes, uncomplicated: Secondary | ICD-10-CM | POA: Insufficient documentation

## 2023-12-02 DIAGNOSIS — Z96642 Presence of left artificial hip joint: Secondary | ICD-10-CM | POA: Insufficient documentation

## 2023-12-02 DIAGNOSIS — Z7984 Long term (current) use of oral hypoglycemic drugs: Secondary | ICD-10-CM | POA: Diagnosis not present

## 2023-12-02 DIAGNOSIS — E119 Type 2 diabetes mellitus without complications: Secondary | ICD-10-CM | POA: Insufficient documentation

## 2023-12-02 DIAGNOSIS — K429 Umbilical hernia without obstruction or gangrene: Secondary | ICD-10-CM | POA: Diagnosis not present

## 2023-12-02 DIAGNOSIS — R339 Retention of urine, unspecified: Secondary | ICD-10-CM | POA: Insufficient documentation

## 2023-12-02 DIAGNOSIS — R000 Tachycardia, unspecified: Secondary | ICD-10-CM | POA: Diagnosis not present

## 2023-12-02 DIAGNOSIS — Z955 Presence of coronary angioplasty implant and graft: Secondary | ICD-10-CM | POA: Insufficient documentation

## 2023-12-02 DIAGNOSIS — I10 Essential (primary) hypertension: Secondary | ICD-10-CM | POA: Diagnosis not present

## 2023-12-02 DIAGNOSIS — K5900 Constipation, unspecified: Secondary | ICD-10-CM | POA: Diagnosis not present

## 2023-12-02 LAB — CBC WITH DIFFERENTIAL/PLATELET
Abs Immature Granulocytes: 0.03 10*3/uL (ref 0.00–0.07)
Basophils Absolute: 0 10*3/uL (ref 0.0–0.1)
Basophils Relative: 0 %
Eosinophils Absolute: 0.3 10*3/uL (ref 0.0–0.5)
Eosinophils Relative: 3 %
HCT: 38.8 % — ABNORMAL LOW (ref 39.0–52.0)
Hemoglobin: 12.5 g/dL — ABNORMAL LOW (ref 13.0–17.0)
Immature Granulocytes: 0 %
Lymphocytes Relative: 21 %
Lymphs Abs: 2.2 10*3/uL (ref 0.7–4.0)
MCH: 29.5 pg (ref 26.0–34.0)
MCHC: 32.2 g/dL (ref 30.0–36.0)
MCV: 91.5 fL (ref 80.0–100.0)
Monocytes Absolute: 0.5 10*3/uL (ref 0.1–1.0)
Monocytes Relative: 5 %
Neutro Abs: 7.3 10*3/uL (ref 1.7–7.7)
Neutrophils Relative %: 71 %
Platelets: 280 10*3/uL (ref 150–400)
RBC: 4.24 MIL/uL (ref 4.22–5.81)
RDW: 13.8 % (ref 11.5–15.5)
WBC: 10.4 10*3/uL (ref 4.0–10.5)
nRBC: 0 % (ref 0.0–0.2)

## 2023-12-02 LAB — COMPREHENSIVE METABOLIC PANEL
ALT: 11 U/L (ref 0–44)
AST: 14 U/L — ABNORMAL LOW (ref 15–41)
Albumin: 3.2 g/dL — ABNORMAL LOW (ref 3.5–5.0)
Alkaline Phosphatase: 84 U/L (ref 38–126)
Anion gap: 11 (ref 5–15)
BUN: 10 mg/dL (ref 8–23)
CO2: 26 mmol/L (ref 22–32)
Calcium: 8.8 mg/dL — ABNORMAL LOW (ref 8.9–10.3)
Chloride: 100 mmol/L (ref 98–111)
Creatinine, Ser: 0.76 mg/dL (ref 0.61–1.24)
GFR, Estimated: >60 mL/min (ref >60)
Glucose, Bld: 142 mg/dL — ABNORMAL HIGH (ref 70–99)
Potassium: 3.5 mmol/L (ref 3.5–5.1)
Sodium: 137 mmol/L (ref 135–145)
Total Bilirubin: 1.1 mg/dL (ref 0.0–1.2)
Total Protein: 6.9 g/dL (ref 6.5–8.1)

## 2023-12-02 LAB — URINALYSIS, ROUTINE W REFLEX MICROSCOPIC
Bilirubin Urine: NEGATIVE
Glucose, UA: NEGATIVE mg/dL
Hgb urine dipstick: NEGATIVE
Ketones, ur: NEGATIVE mg/dL
Leukocytes,Ua: NEGATIVE
Nitrite: NEGATIVE
Protein, ur: NEGATIVE mg/dL
Specific Gravity, Urine: 1.017 (ref 1.005–1.030)
pH: 6 (ref 5.0–8.0)

## 2023-12-02 MED ORDER — MIDAZOLAM HCL (PF) 10 MG/2ML IJ SOLN
3.0000 mg | Freq: Once | INTRAMUSCULAR | Status: AC
  Administered 2023-12-02: 3 mg via INTRAVENOUS
  Filled 2023-12-02: qty 2

## 2023-12-02 MED ORDER — LACTULOSE 10 GM/15ML PO SOLN
30.0000 g | Freq: Once | ORAL | Status: AC
  Administered 2023-12-02: 30 g via ORAL
  Filled 2023-12-02: qty 45

## 2023-12-02 MED ORDER — FENTANYL CITRATE PF 50 MCG/ML IJ SOSY
50.0000 ug | PREFILLED_SYRINGE | Freq: Once | INTRAMUSCULAR | Status: AC
  Administered 2023-12-02: 50 ug via INTRAVENOUS
  Filled 2023-12-02: qty 1

## 2023-12-02 MED ORDER — GLYCERIN (LAXATIVE) 1 G RE SUPP
1.0000 | RECTAL | Status: DC | PRN
  Administered 2023-12-02: 1 g via RECTAL
  Filled 2023-12-02: qty 1

## 2023-12-02 MED ORDER — GLYCERIN (ADULT) 2 G RE SUPP: 1.0000 | RECTAL | 0 refills | Status: DC | PRN

## 2023-12-02 MED ORDER — MIDAZOLAM HCL (PF) 10 MG/2ML IJ SOLN: 4.0000 mg | Freq: Once | INTRAMUSCULAR | Status: DC

## 2023-12-02 MED ORDER — LIDOCAINE VISCOUS HCL 2 % MT SOLN
15.0000 mL | Freq: Once | OROMUCOSAL | Status: AC
  Administered 2023-12-02: 15 mL via OROMUCOSAL
  Filled 2023-12-02: qty 15

## 2023-12-02 MED ORDER — GOLYTELY 236 G PO SOLR: 4000.0000 mL | Freq: Once | ORAL | 0 refills | Status: AC

## 2023-12-02 MED ORDER — ONDANSETRON HCL 4 MG/2ML IJ SOLN
4.0000 mg | Freq: Once | INTRAMUSCULAR | Status: AC
  Administered 2023-12-02: 4 mg via INTRAVENOUS
  Filled 2023-12-02: qty 2

## 2023-12-02 MED ORDER — MAGNESIUM CITRATE PO SOLN
1.0000 | Freq: Once | ORAL | Status: AC
  Administered 2023-12-02: 1 via ORAL
  Filled 2023-12-02: qty 296

## 2023-12-02 MED ORDER — LIDOCAINE-EPINEPHRINE (PF) 2 %-1:200000 IJ SOLN
10.0000 mL | Freq: Once | INTRAMUSCULAR | Status: AC
  Administered 2023-12-02: 10 mL via INTRADERMAL
  Filled 2023-12-02: qty 20

## 2023-12-02 NOTE — ED Notes (Signed)
 Dr attempted to disimpact patient. Patient was cleaned and new linens were placed on the bed. Patient is resting in bed.

## 2023-12-02 NOTE — ED Provider Notes (Signed)
 [facility] AT [facility] Provider Note  CSN: [id number] Arrival date & time: 12/02/23 1518  Chief Complaint(s) Abdominal Pain  HPI Pranay Hilbun. is a 73 y.o. male with PMH CAD status post PCI in April 2024, PAD, T2DM, chronic hypotension, osteomyelitis status post left AKA who presents emergency room for evaluation of urinary retention and constipation.  States he has not been able to stool for the last 3 days and has not urinated in the last 24 hours.  Endorsing significant pain in the suprapubic region.  Denies associated nausea, vomiting, headache, fever or other systemic symptoms.  States he has had to have catheterization before.   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   MRSA infection 06/24/2023   History of MDR Pseudomonas aeruginosa infection 06/24/2023   Osteomyelitis (HCC) 06/19/2023   Ambulatory dysfunction 05/01/2023   Weakness 04/24/2023   Medication management 04/16/2023   Bacteremia 04/12/2023   Aspiration pneumonia (HCC) 04/11/2023   Pressure injury of skin 04/11/2023   Nausea and vomiting 04/11/2023   Elevated troponin 04/11/2023   CAD (coronary artery disease) 04/11/2023   Hypokalemia 04/11/2023   PVD (peripheral vascular disease) (HCC) 04/11/2023   Debility 04/11/2023   Hyperglycemia 01/16/2023   STEMI (ST elevation myocardial infarction) (HCC) 01/15/2023   Diabetic ketoacidosis without coma associated with type 2 diabetes mellitus (HCC) 01/15/2023   Anterior dislocation of left hip (HCC) 08/24/2022   Closed dislocation of left hip (HCC) 08/23/2022   Normocytic anemia 08/23/2022    Protein-calorie malnutrition, moderate (HCC) 07/20/2022   Fall    Orthostatic hypotension    Hip fracture (HCC) 02/22/2022   Hyperlipidemia 02/22/2022   Chronic diastolic CHF (congestive heart failure) (HCC) 02/22/2022   Left great toe amputee (HCC) 05/06/2020   Cellulitis in diabetic foot (HCC) 04/27/2020   Osteomyelitis of great toe of left foot (HCC) 04/27/2020   Type 2 diabetes mellitus with hyperlipidemia (HCC) 04/27/2020   History of DVT (deep vein thrombosis) 04/27/2020   Tobacco abuse 04/27/2020   Home Medication(s) Prior to Admission medications   Medication Sig Start Date End Date Taking? Authorizing Provider  glycerin adult 2 g suppository Place 1 suppository rectally as needed for constipation. 12/02/23  Yes Lilyanne Mcquown, MD  acetaminophen (TYLENOL) 325 MG tablet Take 2 tablets (650 mg total) by mouth every 6 (six) hours as needed for mild pain, fever or headache. 04/22/23   Lonia Blood, MD  ascorbic acid (VITAMIN C) 1000 MG tablet Take 1 tablet (1,000 mg total) by mouth daily. 06/30/23   Amin, Ankit C, MD  aspirin EC 81 MG tablet Take 1 tablet (81 mg total) by mouth daily. Swallow whole. 01/20/23   Lanae Boast, MD  atorvastatin (LIPITOR) 10 MG tablet One tablet by mouth twice a week    [provider]  barrier cream (NON-SPECIFIED) CREA Apply 1 Application topically 2 (two) times daily as needed. 05/04/23   Standley Brooking, MD  bisacodyl (DULCOLAX) 5 MG EC tablet Take 1 tablet (5 mg total) by mouth daily as needed for moderate constipation or severe  constipation. 06/30/23   Amin, Ankit C, MD  Blood Glucose Monitoring Suppl DEVI 1 each by Does not apply route 3 (three) times daily. May dispense any manufacturer covered by patient's insurance. 01/20/23   Lanae Boast, MD  BRILINTA 90 MG TABS tablet Take 90 mg by mouth 2 (two) times daily. 04/09/23   [provider]  docusate sodium (COLACE) 100 MG capsule Take 1 capsule (100 mg total) by mouth daily. 06/30/23    Amin, Doreen Salvage, MD  Ensure Max Protein (ENSURE MAX PROTEIN) LIQD Take 330 mLs (11 oz total) by mouth 2 (two) times daily. Patient taking differently: Take 11 oz by mouth daily. 08/28/22   Lorin Glass, MD  fludrocortisone (FLORINEF) 0.1 MG tablet Take 2 tablets (0.2 mg total) by mouth daily. 06/30/23   Amin, Ankit C, MD  Glucagon, rDNA, (GLUCAGON EMERGENCY) 1 MG KIT Inject 1 mg into the skin as needed for up to 2 doses (Severe low blood sugar). 01/20/23   Lanae Boast, MD  Glucose Blood (BLOOD GLUCOSE TEST STRIPS) STRP 1 each by Does not apply route 3 (three) times daily. Use as directed to check blood sugar. May dispense any manufacturer covered by patient's insurance and fits patient's device. 01/20/23   Lanae Boast, MD  insulin aspart (NOVOLOG) 100 UNIT/ML FlexPen Inject 8 Units into the skin 3 (three) times daily with meals. Only take if eating a meal AND Blood Glucose (BG) is 80 or higher. 04/22/23 07/23/23  Lonia Blood, MD  insulin detemir (LEVEMIR) 100 UNIT/ML FlexPen Inject 20 Units into the skin at bedtime. DO NOT TAKE if your blood sugars and consistently running < 120 04/23/23   Lonia Blood, MD  Insulin Pen Needle (PEN NEEDLES) 31G X 5 MM MISC 1 each by Does not apply route 3 (three) times daily. May dispense any manufacturer covered by patient's insurance. 01/20/23   Lanae Boast, MD  JARDIANCE 10 MG TABS tablet Take 10 mg by mouth daily. 04/09/23   [provider]  Lancet Device MISC 1 each by Does not apply route 3 (three) times daily. May dispense any manufacturer covered by patient's insurance. 01/20/23   Lanae Boast, MD  Lancets MISC 1 each by Does not apply route 3 (three) times daily. Use as directed to check blood sugar. May dispense any manufacturer covered by patient's insurance and fits patient's device. 01/20/23   Lanae Boast, MD  magnesium citrate SOLN Take 296 mLs (1 Bottle total) by mouth daily as needed for severe constipation or moderate constipation. 07/05/23   Dorcas Carrow, MD  oxyCODONE (OXY IR/ROXICODONE) 5 MG immediate release tablet Take 1-2 tablets (5-10 mg total) by mouth every 4 (four) hours as needed for moderate pain, severe pain or breakthrough pain (pain score 4-6). 06/30/23   Amin, Ankit C, MD  polyethylene glycol (MIRALAX / GLYCOLAX) 17 g packet Take 17 g by mouth daily. 07/05/23   Dorcas Carrow, MD  senna (SENOKOT) 8.6 MG TABS tablet Take 1 tablet (8.6 mg total) by mouth 2 (two) times daily as needed for mild constipation or moderate constipation. 06/30/23   Amin, Ankit C, MD  tamsulosin (FLOMAX) 0.4 MG CAPS capsule Take 1 capsule (0.4 mg total) by mouth daily after supper. 06/30/23   Miguel Rota, MD  Past Surgical History Past Surgical History:  Procedure Laterality Date   AMPUTATION Left 06/28/2023   Procedure: LEFT ABOVE KNEE AMPUTATION;  Surgeon: Nadara Mustard, MD;  Location: [facility] OR;  Service: Orthopedics;  Laterality: Left;   AMPUTATION TOE Left 04/28/2020   Procedure: AMPUTATION LEFT GREAT TOE;  Surgeon: Nadara Mustard, MD;  Location: [facility] OR;  Service: Orthopedics;  Laterality: Left;   CORONARY/GRAFT ACUTE MI REVASCULARIZATION N/A 01/15/2023   Procedure: Coronary/Graft Acute MI Revascularization;  Surgeon: Orbie Pyo, MD;  Location: [facility] INVASIVE CV LAB;  Service: Cardiovascular;  Laterality: N/A;   INTRAMEDULLARY (IM) NAIL INTERTROCHANTERIC Right 02/23/2022   Procedure: INTRAMEDULLARY (IM) NAIL INTERTROCHANTRIC, RIGHT;  Surgeon: Samson Frederic, MD;  Location: [facility] ORS;  Service: Orthopedics;  Laterality: Right;   LEFT HEART CATH AND CORONARY ANGIOGRAPHY N/A 01/15/2023   Procedure: LEFT HEART CATH AND CORONARY ANGIOGRAPHY;  Surgeon: Orbie Pyo, MD;  Location: [facility] INVASIVE CV LAB;  Service: Cardiovascular;  Laterality: N/A;   LOWER EXTREMITY ANGIOGRAPHY Bilateral 01/18/2023   Procedure: Lower Extremity  Angiography;  Surgeon: Nada Libman, MD;  Location: [facility] INVASIVE CV LAB;  Service: Cardiovascular;  Laterality: Bilateral;   MELANOMA EXCISION WITH SENTINEL LYMPH NODE BIOPSY Right 08/19/2020   Procedure: WIDE LOCAL EXCISION RIGHT ELBOW MELANOMA WITH RIGHT AXILLARY SENTINEL LYMPH NODE BIOPSY;  Surgeon: Fritzi Mandes, MD;  Location: [facility] OR;  Service: General;  Laterality: Right;  2nd incision in axilla   TEE WITHOUT CARDIOVERSION N/A 04/19/2023   Procedure: TRANSESOPHAGEAL ECHOCARDIOGRAM;  Surgeon: Little Ishikawa, MD;  [facility] LAB;  Service: Cardiovascular;  Laterality: N/A;   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   Procedure: TOTAL HIP ARTHROPLASTY ANTERIOR APPROACH;  Surgeon: Samson Frederic, MD;  Location: [facility] ORS;  Service: Orthopedics;  Laterality: Left;   TOTAL HIP ARTHROPLASTY Left 08/24/2022   Procedure: OPEN REDUCTION, HEAD AND LINER EXCHANGE;  Surgeon: Samson Frederic, MD;  Location: [facility] ORS;  Service: Orthopedics;  Laterality: Left;   Family History Family History  Problem Relation Age of Onset   Stroke Mother    Diabetes Father    Stroke Sister    Diabetes Sister     Social History Social History   Tobacco Use   Smoking status: Every Day    Current packs/day: 0.20    Average packs/day: 0.2 packs/day for 19.0 years (3.8 ttl pk-yrs)    Types: Cigarettes   Smokeless tobacco: Never  Vaping Use   Vaping status: Never Used  Substance Use Topics   Alcohol use: No   Drug use: Not Currently   Allergies Patient has no known allergies.  Review of Systems Review of Systems  Gastrointestinal:  Positive for abdominal pain and constipation.  Genitourinary:  Positive for difficulty urinating.    Physical Exam Vital Signs  I have reviewed the triage vital signs BP 135/88   Pulse (!) 104   Temp 98.5 F (36.9 C) (Oral)   Resp 18   Ht 6\' 5"  (1.956 m)   Wt 88.5 kg   SpO2 96%   BMI 23.12 kg/m   Physical Exam Constitutional:      General: He is  in acute distress.     Appearance: Normal appearance.  HENT:     Head: Normocephalic and atraumatic.     Nose: No congestion or rhinorrhea.  Eyes:     General:        Right eye: No discharge.        Left eye: No discharge.  Extraocular Movements: Extraocular movements intact.     Pupils: Pupils are equal, round, and reactive to light.  Cardiovascular:     Rate and Rhythm: Normal rate and regular rhythm.     Heart sounds: No murmur heard. Pulmonary:     Effort: No respiratory distress.     Breath sounds: No wheezing or rales.  Abdominal:     General: There is no distension.     Tenderness: There is abdominal tenderness in the suprapubic area.  Musculoskeletal:        General: Normal range of motion.     Cervical back: Normal range of motion.  Skin:    General: Skin is warm and dry.  Neurological:     General: No focal deficit present.     Mental Status: He is alert.     ED Results and Treatments Labs (all labs ordered are listed, but only abnormal results are displayed) Labs Reviewed  COMPREHENSIVE METABOLIC PANEL - Abnormal; Notable for the following components:      Result Value   Glucose, Bld 142 (*)    Calcium 8.8 (*)    Albumin 3.2 (*)    AST 14 (*)    All other components within normal limits  CBC WITH DIFFERENTIAL/PLATELET - Abnormal; Notable for the following components:   Hemoglobin 12.5 (*)    HCT 38.8 (*)    All other components within normal limits  URINALYSIS, ROUTINE W REFLEX MICROSCOPIC                                                                                                                          Radiology CT ABDOMEN PELVIS WO CONTRAST Result Date: 12/02/2023 CLINICAL DATA:  Concern for bowel obstruction. EXAM: CT ABDOMEN AND PELVIS WITHOUT CONTRAST TECHNIQUE: Multidetector CT imaging of the abdomen and pelvis was performed following the standard protocol without IV contrast. RADIATION DOSE REDUCTION: This exam was performed according to  the departmental dose-optimization program which includes automated exposure control, adjustment of the mA and/or kV according to patient size and/or use of iterative reconstruction technique. COMPARISON:  CT abdomen pelvis dated 11/04/2023. FINDINGS: Evaluation of this exam is limited in the absence of intravenous contrast. Lower chest: Minimal bibasilar atelectasis. There is coronary vascular calcification. No intra-abdominal free air or free fluid. Hepatobiliary: The liver is unremarkable. No biliary dilatation. The gallbladder is unremarkable. Pancreas: Unremarkable. No pancreatic ductal dilatation or surrounding inflammatory changes. Spleen: Normal in size without focal abnormality. Adrenals/Urinary Tract: The adrenal glands unremarkable. There is no hydronephrosis or nephrolithiasis on either side. The visualized ureters are unremarkable. The urinary bladder is decompressed around a Foley catheter. Stomach/Bowel: There is moderate amount of stool throughout the colon and large stool in the rectal vault. There is no bowel obstruction or active inflammation. The appendix is normal. Vascular/Lymphatic: Mild aortoiliac atherosclerotic disease. The IVC is unremarkable. No portal venous gas. There is no adenopathy. Reproductive: The prostate and seminal vessels are grossly unremarkable. No pelvic mass. Other:  Small fat containing umbilical hernia. Musculoskeletal: Osteopenia with degenerative changes of the spine. Total left hip arthroplasty and status post prior ORIF of the right femoral neck. No acute osseous pathology. IMPRESSION: 1. No acute intra-abdominal or pelvic pathology. No bowel obstruction. Normal appendix. 2. Moderate colonic stool burden. Electronically Signed   By: Elgie Collard M.D.   On: 12/02/2023 17:15    Pertinent labs & imaging results that were available during my care of the patient were reviewed by me and considered in my medical decision making (see MDM for details).  Medications  Ordered in ED Medications  lactulose (CHRONULAC) 10 GM/15ML solution 30 g (30 g Oral Given 12/02/23 1742)  magnesium citrate solution 1 Bottle (1 Bottle Oral Given 12/02/23 2001)  fentaNYL (SUBLIMAZE) injection 50 mcg (50 mcg Intravenous Given 12/02/23 2000)  lidocaine (XYLOCAINE) 2 % viscous mouth solution 15 mL (15 mLs Mouth/Throat Given 12/02/23 2000)  ondansetron (ZOFRAN) injection 4 mg (4 mg Intravenous Given 12/02/23 2017)  midazolam PF (VERSED) injection 3 mg (3 mg Intravenous Given 12/02/23 2157)  lidocaine-EPINEPHrine (XYLOCAINE W/EPI) 2 %-1:200000 (PF) injection 10 mL (10 mLs Intradermal Given by Other 12/02/23 2209)                                                                                                                                     Procedures .Fecal disimpaction  Date/Time: 12/03/2023 11:33 AM  Performed by: Glendora Score, MD Authorized by: Glendora Score, MD  Consent: Verbal consent obtained. Consent given by: patient Patient understanding: patient states understanding of the procedure being performed Patient consent: the patient's understanding of the procedure matches consent given Procedure consent: procedure consent matches procedure scheduled Relevant documents: relevant documents present and verified Test results: test results available and properly labeled Local anesthesia used: yes Anesthesia: local infiltration  Anesthesia: Local anesthesia used: yes Local Anesthetic: lidocaine 2% with epinephrine Anesthetic total: 10 mL  Sedation: Patient sedated: yes Sedation type: anxiolysis Sedatives: midazolam  Patient tolerance: patient tolerated the procedure well with no immediate complications     (including critical care time)  Medical Decision Making / ED Course   This patient presents to the ED for concern of constipation, urinary retention, this involves an extensive number of treatment options, and is a complaint that carries with it a high  risk of complications and morbidity.  The differential diagnosis includes acute urinary retention, bladder outlet obstruction from constipation, fecal impaction, obstruction  MDM: Patient seen emerged from for evaluation of constipation and urinary retention.  Physical exam with significant tenderness and distention in the abdomen, large erythematous rash from likely irritation from fecal matter/urinary matter on the back but is otherwise unremarkable.  Laboratory evaluation with a hemoglobin of 12.5 but is otherwise unremarkable.  Foley catheter placed leading to evacuation of bladder at approximately 1200 cc of urine.  CT abdomen pelvis showing moderate colonic stool burden but is otherwise unremarkable.  We tried  multiple methods to help assist the patient to use the restroom including soapsuds enema, glycerin suppository, mag citrate, lactulose but unfortunately patient did not have bowel movement.  Thus we used midazolam for anxiolysis and I performed a fecal impaction removing hard stool balls at the most distal tip of the colon and rectum.  Patient with symptomatic relief.  At this time he does not meet inpatient criteria for admission and will be discharged with outpatient follow-up.  Patient given GoLytely for home use.   Additional history obtained: -External records from outside source obtained and reviewed including: Chart review including previous notes, labs, imaging, consultation notes   Lab Tests: -I ordered, reviewed, and interpreted labs.   The pertinent results include:   Labs Reviewed  COMPREHENSIVE METABOLIC PANEL - Abnormal; Notable for the following components:      Result Value   Glucose, Bld 142 (*)    Calcium 8.8 (*)    Albumin 3.2 (*)    AST 14 (*)    All other components within normal limits  CBC WITH DIFFERENTIAL/PLATELET - Abnormal; Notable for the following components:   Hemoglobin 12.5 (*)    HCT 38.8 (*)    All other components within normal limits   URINALYSIS, ROUTINE W REFLEX MICROSCOPIC      EKG   EKG Interpretation Date/Time:  Monday December 02 2023 15:24:47 EDT Ventricular Rate:  107 PR Interval:  99 QRS Duration:  85 QT Interval:  352 QTC Calculation: 448 R Axis:   -8  Text Interpretation: Sinus tachycardia Multiform ventricular premature complexes Short PR interval Borderline repol abnormality, diffuse leads When compared with ECG of 11/03/2023, Premature atrial complexes are now present Confirmed by Dione Booze ([id number]) on 12/02/2023 11:47:35 PM         Imaging Studies ordered: I ordered imaging studies including CTAP I independently visualized and interpreted imaging. I agree with the radiologist interpretation   Medicines ordered and prescription drug management: Meds ordered this encounter  Medications   lactulose (CHRONULAC) 10 GM/15ML solution 30 g   magnesium citrate solution 1 Bottle   DISCONTD: glycerin (Pediatric) 1 g suppository 1 g   fentaNYL (SUBLIMAZE) injection 50 mcg   lidocaine (XYLOCAINE) 2 % viscous mouth solution 15 mL   ondansetron (ZOFRAN) injection 4 mg   DISCONTD: midazolam PF (VERSED) injection 4 mg   midazolam PF (VERSED) injection 3 mg   lidocaine-EPINEPHrine (XYLOCAINE W/EPI) 2 %-1:200000 (PF) injection 10 mL   glycerin adult 2 g suppository    Sig: Place 1 suppository rectally as needed for constipation.    Dispense:  12 suppository    Refill:  0   polyethylene glycol (GOLYTELY) 236 g solution    Sig: Take 4,000 mLs by mouth once for 1 dose.    Dispense:  4000 mL    Refill:  0    -I have reviewed the patients home medicines and have made adjustments as needed  Critical interventions none   Cardiac Monitoring: The patient was maintained on a cardiac monitor.  I personally viewed and interpreted the cardiac monitored which showed an underlying rhythm of: NSR  Social Determinants of Health:  Factors impacting patients care include: none   Reevaluation: After the  interventions noted above, I reevaluated the patient and found that they have :improved  Co morbidities that complicate the patient evaluation  Past Medical History:  Diagnosis Date   Cancer (HCC)    right elbow melanoma   Diabetes mellitus without complication (HCC)    Type  II   Dizziness    DVT (deep venous thrombosis) (HCC)    right leg   Frequent falls    Near syncope    Syncope       Dispostion: I considered admission for this patient, but at this time he does not meet inpatient criteria for admission and will be discharged with outpatient follow-up     Final Clinical Impression(s) / ED Diagnoses Final diagnoses:  Constipation, unspecified constipation type     @PCDICTATION @    Glendora Score, MD 12/03/23 1137

## 2023-12-02 NOTE — ED Notes (Addendum)
 Pt checked for BM following soap suds enema administration. Minimal stool smears noted to pad. Pt cleaned with warm water and soap. Excoriation and dry skin noted from rectum to mid back. Moister barrier cream applied to entire area.

## 2023-12-02 NOTE — ED Notes (Signed)
Message sent to pharmacy for medications

## 2023-12-02 NOTE — ED Notes (Signed)
Called [facility] for transport.  

## 2023-12-02 NOTE — Discharge Instructions (Addendum)
 You were evaluated in the Emergency Department and after careful evaluation, we did not find any emergent condition requiring admission or further testing in the hospital.  Your exam/testing today is overall reassuring.  Continue treating her constipation at home with the medications prescribed.  Your catheter will need to be removed in the office with urology, call to schedule follow-up.  Please return to the Emergency Department if you experience any worsening of your condition.   Thank you for allowing [place] to be a part of your care.

## 2023-12-02 NOTE — ED Notes (Signed)
 Bladder scan 

## 2023-12-02 NOTE — ED Notes (Signed)
 Bladder scanned pt, 820 ML

## 2023-12-02 NOTE — ED Triage Notes (Signed)
 PT BIB [facility] from home after constipation lasting x3 days with abdominal pain. Abdomen rigid, PT complains of itching around anus. Denies trouble urinating. Aox4.  [facility] VSS 108 P, 20 RR, 98%, 140/80 180 CBG 98.1 Temp

## 2023-12-03 DIAGNOSIS — K5900 Constipation, unspecified: Secondary | ICD-10-CM | POA: Diagnosis not present

## 2023-12-03 DIAGNOSIS — R531 Weakness: Secondary | ICD-10-CM | POA: Diagnosis not present

## 2023-12-03 DIAGNOSIS — Z7401 Bed confinement status: Secondary | ICD-10-CM | POA: Diagnosis not present

## 2023-12-10 ENCOUNTER — Ambulatory Visit: Payer: Medicare Other | Attending: Physician Assistant | Admitting: Physician Assistant

## 2023-12-10 NOTE — Progress Notes (Deleted)
 Cardiology Office Note:  .   Date:  12/10/2023  ID:  Micheal Moore., DOB 05/23/1951, MRN [id number] PCP: [name], MD  [facility] Providers Cardiologist:  Dietrich Pates, MD {  History of Present Illness: Micheal Moore. is a 73 y.o. male with a past medical history of dizziness CAD S/P PCI in April and 1 episode of syncope here for follow-up appointment.  Last saw Dr. Tenny Craw March 2023.  He has syncope spells that started about a year ago.  1-2 spells a week.  When not having spells he feels okay.  He states on days where he is dizzy and get dizzy several times and syncopal spells occur when he bent down to pick up something like his dog.  Maybe passed out at that time. Denies numbness in limbs.  Bowels moving okay.  No nausea.  States that he does drink adequate fluids.  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  He is currently residing at [facility] rehab facility. He reports confusion about his medications, particularly his statin. He was previously taking rosuvastatin twice a week at home, but the facility list indicates atorvastatin. He is unsure what he is currently receiving at the facility. He confirms taking Brilinta and Jardiance for diabetes. He also takes aspirin daily.  The patient reports occasional dizziness, particularly upon standing, but denies any episodes of passing out. He recalls an incident at a previous job where he fell due to heat and woke up with the ambulance present, but he maintains consciousness was not lost.  His diabetes management includes Jardiance, Lantus injections (20 units at night), and 8-10 units of insulin with meals. He reports his blood sugar levels have been high, with readings over 150 and even reaching 300 at times. He is currently on a diet provided by the rehab facility.  The  patient also reports pain and swelling in his remaining leg, which he attributes to attempts at standing during physical therapy. He has not yet been fitted for a prosthetic. He already had follow-up with Dr. Lajoyce Corners and he confirmed the amputation site was healing well.  Reports no shortness of breath nor dyspnea on exertion. Reports no chest pain, pressure, or tightness. No edema, orthopnea, PND. Reports no palpitations.   He has had 2 recent ED visits, 1 for nausea vomiting and the other for constipation (12/02/2023)  Today, he ***  ROS: Pertinent ROS in HPI  Studies Reviewed: Marland Kitchen       TEE 04/19/2023 IMPRESSIONS     1. Left ventricular ejection fraction, by estimation, is 55 to 60%. The  left ventricle has normal function. The left ventricle has no regional  wall motion abnormalities. There is moderate left ventricular hypertrophy.   2. Right ventricular systolic function is normal. The right ventricular  size is normal.   3. No left atrial/left atrial appendage thrombus was detected.   4. The mitral valve is degenerative. Trivial mitral valve regurgitation.   5. The aortic valve is tricuspid. Aortic valve regurgitation is not  visualized. No aortic stenosis is present.   Conclusion(s)/Recommendation(s): No evidence of vegetation/infective  endocarditis on this transesophageael echocardiogram.   FINDINGS   Left Ventricle: Left ventricular ejection fraction, by estimation, is 55  to 60%. The left ventricle has normal function. The left ventricle has no  regional wall motion abnormalities.  The left ventricular internal cavity  size was normal in size. There is   moderate left ventricular hypertrophy.   Right Ventricle: The right ventricular size is normal. No increase in  right ventricular wall thickness. Right ventricular systolic function is  normal.   Left Atrium: Left atrial size was normal in size. No left atrial/left  atrial appendage thrombus was detected.   Right Atrium:  Right atrial size was normal in size.   Pericardium: There is no evidence of pericardial effusion.   Mitral Valve: The mitral valve is degenerative in appearance. Trivial  mitral valve regurgitation.   Tricuspid Valve: The tricuspid valve is grossly normal. Tricuspid valve  regurgitation is not demonstrated.   Aortic Valve: The aortic valve is tricuspid. Aortic valve regurgitation is  not visualized. No aortic stenosis is present. Aortic valve mean gradient  measures 2.0 mmHg. Aortic valve peak gradient measures 3.5 mmHg.   Pulmonic Valve: The pulmonic valve was normal in structure. Pulmonic valve  regurgitation is trivial. No evidence of pulmonic stenosis.   Aorta: The aortic root is normal in size and structure.   IAS/Shunts: No atrial level shunt detected by color flow Doppler.   Additional Comments: Spectral Doppler performed.       Physical Exam:   VS:  There were no vitals taken for this visit.   Wt Readings from Last 3 Encounters:  12/02/23 195 lb (88.5 kg)  07/23/23 196 lb (88.9 kg)  06/28/23 195 lb (88.5 kg)    GEN: Well nourished, well developed in no acute distress NECK: No JVD; No carotid bruits CARDIAC: RRR, no murmurs, rubs, gallops RESPIRATORY:  Clear to auscultation without rales, wheezing or rhonchi  ABDOMEN: Soft, non-tender, non-distended EXTREMITIES:  No edema; No deformity   ASSESSMENT AND PLAN: .    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 No current signs of infection. Patient reports pain and swelling. -Continue current pain management plan. -Encourage physical therapy for mobility and strength.  Hyperlipidemia Patient on multiple cardiac medications including aspirin, Brilinta, and a statin (discrepancy between atorvastatin and rosuvastatin). -Clarify statin medication with rehab facility and ensure correct medication is being administered.  CAD s/p PCI in April 2024 -Add Brilinta to medication list. He is currently on DAPT  with asa and brilinta at the facility  Diabetes Mellitus Patient on Jardiance and insulin regimen. Recent A1c was 8.4, indicating suboptimal control. -Continue current medication regimen. -Encourage regular blood glucose monitoring and adherence to diabetic diet. -Check A1c at next visit.  Post-Operative Care Patient to be discharged from rehab facility soon. -Ensure patient receives clear discharge instructions regarding medication regimen. -Ensure patient has enough medication upon discharge.      Dispo: He can follow-up with Dr. Tenny Craw in 3 months  Signed, Sharlene Dory, PA-C

## 2023-12-13 ENCOUNTER — Encounter (HOSPITAL_COMMUNITY): Admission: RE | Admit: 2023-12-13 | Source: Ambulatory Visit

## 2023-12-16 ENCOUNTER — Ambulatory Visit: Payer: Medicare HMO | Admitting: Orthopedic Surgery

## 2023-12-16 ENCOUNTER — Ambulatory Visit (INDEPENDENT_AMBULATORY_CARE_PROVIDER_SITE_OTHER): Payer: Medicare HMO | Admitting: Orthopedic Surgery

## 2023-12-16 DIAGNOSIS — Z89611 Acquired absence of right leg above knee: Secondary | ICD-10-CM | POA: Diagnosis not present

## 2023-12-16 DIAGNOSIS — S78119A Complete traumatic amputation at level between unspecified hip and knee, initial encounter: Secondary | ICD-10-CM | POA: Diagnosis not present

## 2023-12-17 ENCOUNTER — Encounter: Payer: Self-pay | Admitting: Orthopedic Surgery

## 2023-12-17 NOTE — Progress Notes (Addendum)
 Office Visit Note   Patient: Micheal Moore.           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  Patient states he has an acute blood blister over the lateral border of the right little toe.  Patient states he also has itching in his lower lumbar spine.  Assessment & Plan: Visit Diagnoses:  1. Above-knee amputation (HCC)     Plan: Patient is provided a prescription for [employer] for above-the-knee prosthesis on the right.  Will place patient in a postoperative shoe to unload pressure from the little toe and plan for three-view radiographs of the right foot at follow-up.  Patient will need gait training either at Ortho care therapy or comp rehab therapy.  Follow-Up Instructions: Return in about 4 weeks (around 01/13/2024).   Ortho Exam  Patient is alert, oriented, no adenopathy, well-dressed, normal affect, normal respiratory effort. Examination the residual limb on the left above-knee amputation is well-healed there is no redness no cellulitis no open ulcers.  On the right foot patient has a blood blister over the lateral border of the right little toe.  This does not probe to bone.  There is hypergranulation tissue this was touched with silver nitrate.  Examination of his lower lumbar spine he does have a small rash from sweating.  Most recent hemoglobin A1c is 8.4.  Imaging: No results found. No images are attached to the encounter.  Labs: Lab Results  Component Value Date   HGBA1C 8.4 (H) 06/28/2023   HGBA1C 8.4 (H) 06/19/2023   HGBA1C 13.3 (H) 01/15/2023   ESRSEDRATE 59 (H) 06/21/2023   ESRSEDRATE 53 (H) 04/27/2020   CRP 11.6  (H) 06/19/2023   CRP 6.1 (H) 04/27/2020   REPTSTATUS 06/24/2023 FINAL 06/21/2023   GRAMSTAIN  06/21/2023    NO WBC SEEN RARE GRAM POSITIVE RODS RARE GRAM POSITIVE COCCI IN PAIRS Performed at [facility], [address], [place], [place] [address]    CULT  06/21/2023    FEW METHICILLIN RESISTANT STAPHYLOCOCCUS AUREUS RARE PSEUDOMONAS AERUGINOSA    LABORGA METHICILLIN RESISTANT STAPHYLOCOCCUS AUREUS 06/21/2023   LABORGA PSEUDOMONAS AERUGINOSA 06/21/2023     Lab Results  Component Value Date   ALBUMIN 3.2 (L) 12/02/2023   ALBUMIN 3.4 (L) 11/03/2023   ALBUMIN 4.0 10/10/2023   PREALBUMIN 20 06/27/2023   PREALBUMIN 19.6 02/23/2022   PREALBUMIN 11.2 (L) 04/27/2020    Lab Results  Component Value Date   MG 2.2 07/02/2023   MG 2.2 06/30/2023   MG 2.4 06/26/2023   Lab Results  Component Value Date   VD25OH 22.64 (L) 04/24/2023   VD25OH 15.13 (L) 02/23/2022    Lab Results  Component Value Date   PREALBUMIN 20 06/27/2023   PREALBUMIN 19.6 02/23/2022   PREALBUMIN 11.2 (L) 04/27/2020      Latest Ref Rng & Units 12/02/2023    3:45 PM 11/03/2023   11:38 PM 11/03/2023   11:34 PM  CBC EXTENDED  WBC 4.0 - 10.5 K/uL 10.4  14.4  RBC 4.22 - 5.81 MIL/uL 4.24  4.19    Hemoglobin 13.0 - 17.0 g/dL 16.1  09.6  04.5   HCT 39.0 - 52.0 % 38.8  37.7  38.0   Platelets 150 - 400 K/uL 280  311    NEUT# 1.7 - 7.7 K/uL 7.3     Lymph# 0.7 - 4.0 K/uL 2.2        There is no height or weight on file to calculate BMI.  Orders:  No orders of the defined types were placed in this encounter.  No orders of the defined types were placed in this encounter.    Procedures: No procedures performed  Clinical Data: No additional findings.  ROS:  All other systems negative, except as noted in the HPI. Review of Systems  Objective: Vital Signs: There were no vitals taken for this visit.  Specialty Comments:  No specialty comments available.  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   MRSA infection 06/24/2023   History of MDR Pseudomonas aeruginosa infection 06/24/2023   Osteomyelitis (HCC) 06/19/2023   Ambulatory dysfunction 05/01/2023   Weakness 04/24/2023   Medication management 04/16/2023   Bacteremia 04/12/2023   Aspiration pneumonia (HCC) 04/11/2023   Pressure injury of skin 04/11/2023   Nausea and vomiting 04/11/2023   Elevated troponin 04/11/2023   CAD (coronary artery disease) 04/11/2023   Hypokalemia 04/11/2023   PVD (peripheral vascular disease) (HCC) 04/11/2023   Debility 04/11/2023   Hyperglycemia 01/16/2023   STEMI (ST elevation myocardial infarction) (HCC) 01/15/2023   Diabetic ketoacidosis without coma associated with type 2 diabetes mellitus (HCC) 01/15/2023   Anterior dislocation of left hip (HCC) 08/24/2022   Closed dislocation of left hip (HCC) 08/23/2022   Normocytic anemia 08/23/2022   Protein-calorie malnutrition, moderate (HCC) 07/20/2022   Fall    Orthostatic hypotension    Hip fracture (HCC) 02/22/2022   Hyperlipidemia 02/22/2022   Chronic diastolic CHF (congestive heart failure) (HCC) 02/22/2022   Left great toe amputee (HCC) 05/06/2020   Cellulitis in diabetic foot (HCC) 04/27/2020   Osteomyelitis of great toe of left foot (HCC) 04/27/2020   Type 2 diabetes mellitus with hyperlipidemia (HCC) 04/27/2020   History of DVT (deep vein thrombosis) 04/27/2020   Tobacco abuse 04/27/2020   Past Medical History:  Diagnosis Date   Cancer (HCC)    right elbow melanoma   Diabetes mellitus without complication (HCC)    Type II   Dizziness    DVT (deep venous thrombosis) (HCC)    right leg   Frequent falls    Near syncope    Syncope     Family History  Problem Relation Age of Onset   Stroke Mother    Diabetes Father    Stroke Sister    Diabetes Sister     Past Surgical History:  Procedure Laterality Date   AMPUTATION Left 06/28/2023   Procedure:  LEFT ABOVE KNEE AMPUTATION;  Surgeon: Nadara Mustard, MD;  Location[facility];  Service: Orthopedics;  Laterality: Left;   AMPUTATION TOE Left 04/28/2020   Procedure: AMPUTATION LEFT GREAT TOE;  Surgeon: Nadara Mustard, MD;  Location: [facility] OR;  Service: Orthopedics;  Laterality: Left;   CORONARY/GRAFT ACUTE MI REVASCULARIZATION N/A 01/15/2023   Procedure: Coronary/Graft Acute MI Revascularization;  Surgeon: Orbie Pyo, MD;  Location: [facility] INVASIVE CV LAB;  Service: Cardiovascular;  Laterality: N/A;   INTRAMEDULLARY (IM) NAIL INTERTROCHANTERIC Right 02/23/2022   Procedure: INTRAMEDULLARY (IM)  NAIL INTERTROCHANTRIC, RIGHT;  Surgeon: Samson Frederic, MD;  Location: [facility] ORS;  Service: Orthopedics;  Laterality: Right;   LEFT HEART CATH AND CORONARY ANGIOGRAPHY N/A 01/15/2023   Procedure: LEFT HEART CATH AND CORONARY ANGIOGRAPHY;  Surgeon: Orbie Pyo, MD;  Location: [facility] INVASIVE CV LAB;  Service: Cardiovascular;  Laterality: N/A;   LOWER EXTREMITY ANGIOGRAPHY Bilateral 01/18/2023   Procedure: Lower Extremity Angiography;  Surgeon: Nada Libman, MD;  Location: [facility] INVAS[facility] CV LAB;  Service: Cardiovascular;  Laterality: Bilateral;   MELANOMA EXCISION WITH SENTINEL LYMPH NODE BIOPSY Right 08/19/2020   Procedure: WIDE LOCAL EXCISION RIGHT ELBOW MELANOMA WITH RIGHT AXILLARY SENTINEL LYMPH NODE BIOPSY;  Surgeon: Fritzi Mandes, MD;  Location: [facility] OR;  Service: General;  Laterality: Right;  2nd incision in axilla   TEE WITHOUT CARDIOVERSION N/A 04/19/2023   Procedure: TRANSESOPHAGEAL ECHOCARDIOGRAM;  Surgeon: Little Ishikawa, MD;  Location: [facility] INVASIVE CV LAB;  Service: Cardiovascular;  Laterality: N/A;   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   Procedure: TOTAL HIP ARTHROPLASTY ANTERIOR APPROACH;  Surgeon: Samson Frederic, MD;  Location: [facility] ORS;  Service: Orthopedics;  Laterality: Left;   TOTAL HIP ARTHROPLASTY Left 08/24/2022   Procedure: OPEN REDUCTION, HEAD AND LINER EXCHANGE;  Surgeon: Samson Frederic,  MD;  Location: [facility] ORS;  Service: Orthopedics;  Laterality: Left;   Social History   Occupational History    Comment: retired  Tobacco Use   Smoking status: Every Day    Current packs/day: 0.20    Average packs/day: 0.2 packs/day for 19.0 years (3.8 ttl pk-yrs)    Types: Cigarettes   Smokeless tobacco: Never  Vaping Use   Vaping status: Never Used  Substance and Sexual Activity   Alcohol use: No   Drug use: Not Currently   Sexual activity: Not on file

## 2023-12-26 ENCOUNTER — Emergency Department (HOSPITAL_COMMUNITY)

## 2023-12-26 ENCOUNTER — Other Ambulatory Visit: Payer: Self-pay

## 2023-12-26 ENCOUNTER — Inpatient Hospital Stay: Payer: Medicare Other | Attending: Hematology

## 2023-12-26 ENCOUNTER — Encounter (HOSPITAL_COMMUNITY): Payer: Self-pay

## 2023-12-26 ENCOUNTER — Inpatient Hospital Stay (HOSPITAL_COMMUNITY)
Admission: EM | Admit: 2023-12-26 | Discharge: 2023-12-31 | DRG: 698 | Disposition: A | Discharge status: Home / Self Care | Attending: Family Medicine | Admitting: Family Medicine

## 2023-12-26 DIAGNOSIS — E1151 Type 2 diabetes mellitus with diabetic peripheral angiopathy without gangrene: Secondary | ICD-10-CM | POA: Diagnosis present

## 2023-12-26 DIAGNOSIS — T83518A Infection and inflammatory reaction due to other urinary catheter, initial encounter: Principal | ICD-10-CM | POA: Diagnosis present

## 2023-12-26 DIAGNOSIS — I2510 Atherosclerotic heart disease of native coronary artery without angina pectoris: Secondary | ICD-10-CM | POA: Diagnosis not present

## 2023-12-26 DIAGNOSIS — F17210 Nicotine dependence, cigarettes, uncomplicated: Secondary | ICD-10-CM | POA: Diagnosis present

## 2023-12-26 DIAGNOSIS — N401 Enlarged prostate with lower urinary tract symptoms: Secondary | ICD-10-CM | POA: Diagnosis present

## 2023-12-26 DIAGNOSIS — Z85820 Personal history of malignant melanoma of skin: Secondary | ICD-10-CM

## 2023-12-26 DIAGNOSIS — R000 Tachycardia, unspecified: Secondary | ICD-10-CM | POA: Diagnosis not present

## 2023-12-26 DIAGNOSIS — B9562 Methicillin resistant Staphylococcus aureus infection as the cause of diseases classified elsewhere: Secondary | ICD-10-CM | POA: Diagnosis not present

## 2023-12-26 DIAGNOSIS — Z86718 Personal history of other venous thrombosis and embolism: Secondary | ICD-10-CM

## 2023-12-26 DIAGNOSIS — J984 Other disorders of lung: Secondary | ICD-10-CM | POA: Diagnosis not present

## 2023-12-26 DIAGNOSIS — Z89512 Acquired absence of left leg below knee: Secondary | ICD-10-CM | POA: Diagnosis not present

## 2023-12-26 DIAGNOSIS — R338 Other retention of urine: Secondary | ICD-10-CM | POA: Diagnosis present

## 2023-12-26 DIAGNOSIS — I739 Peripheral vascular disease, unspecified: Secondary | ICD-10-CM | POA: Diagnosis present

## 2023-12-26 DIAGNOSIS — Z79899 Other long term (current) drug therapy: Secondary | ICD-10-CM | POA: Diagnosis not present

## 2023-12-26 DIAGNOSIS — Z89612 Acquired absence of left leg above knee: Secondary | ICD-10-CM

## 2023-12-26 DIAGNOSIS — Z7984 Long term (current) use of oral hypoglycemic drugs: Secondary | ICD-10-CM

## 2023-12-26 DIAGNOSIS — I110 Hypertensive heart disease with heart failure: Secondary | ICD-10-CM | POA: Diagnosis present

## 2023-12-26 DIAGNOSIS — I10 Essential (primary) hypertension: Secondary | ICD-10-CM | POA: Diagnosis not present

## 2023-12-26 DIAGNOSIS — T83091A Other mechanical complication of indwelling urethral catheter, initial encounter: Secondary | ICD-10-CM | POA: Diagnosis present

## 2023-12-26 DIAGNOSIS — I951 Orthostatic hypotension: Secondary | ICD-10-CM | POA: Diagnosis present

## 2023-12-26 DIAGNOSIS — Z9861 Coronary angioplasty status: Secondary | ICD-10-CM

## 2023-12-26 DIAGNOSIS — Z794 Long term (current) use of insulin: Secondary | ICD-10-CM | POA: Diagnosis not present

## 2023-12-26 DIAGNOSIS — D649 Anemia, unspecified: Secondary | ICD-10-CM | POA: Diagnosis not present

## 2023-12-26 DIAGNOSIS — A4102 Sepsis due to Methicillin resistant Staphylococcus aureus: Secondary | ICD-10-CM | POA: Diagnosis not present

## 2023-12-26 DIAGNOSIS — R509 Fever, unspecified: Secondary | ICD-10-CM | POA: Diagnosis not present

## 2023-12-26 DIAGNOSIS — Z22322 Carrier or suspected carrier of Methicillin resistant Staphylococcus aureus: Secondary | ICD-10-CM | POA: Diagnosis not present

## 2023-12-26 DIAGNOSIS — A419 Sepsis, unspecified organism: Secondary | ICD-10-CM | POA: Diagnosis present

## 2023-12-26 DIAGNOSIS — N390 Urinary tract infection, site not specified: Principal | ICD-10-CM | POA: Diagnosis present

## 2023-12-26 DIAGNOSIS — T83511A Infection and inflammatory reaction due to indwelling urethral catheter, initial encounter: Secondary | ICD-10-CM | POA: Diagnosis not present

## 2023-12-26 DIAGNOSIS — I2583 Coronary atherosclerosis due to lipid rich plaque: Secondary | ICD-10-CM | POA: Diagnosis not present

## 2023-12-26 DIAGNOSIS — Z1152 Encounter for screening for COVID-19: Secondary | ICD-10-CM | POA: Diagnosis not present

## 2023-12-26 DIAGNOSIS — E785 Hyperlipidemia, unspecified: Secondary | ICD-10-CM | POA: Diagnosis present

## 2023-12-26 DIAGNOSIS — E872 Acidosis, unspecified: Secondary | ICD-10-CM | POA: Diagnosis present

## 2023-12-26 DIAGNOSIS — I5032 Chronic diastolic (congestive) heart failure: Secondary | ICD-10-CM | POA: Diagnosis present

## 2023-12-26 DIAGNOSIS — Z833 Family history of diabetes mellitus: Secondary | ICD-10-CM

## 2023-12-26 DIAGNOSIS — E1169 Type 2 diabetes mellitus with other specified complication: Secondary | ICD-10-CM | POA: Diagnosis present

## 2023-12-26 DIAGNOSIS — Z96642 Presence of left artificial hip joint: Secondary | ICD-10-CM | POA: Diagnosis present

## 2023-12-26 DIAGNOSIS — Z7952 Long term (current) use of systemic steroids: Secondary | ICD-10-CM

## 2023-12-26 DIAGNOSIS — Z7401 Bed confinement status: Secondary | ICD-10-CM | POA: Diagnosis not present

## 2023-12-26 DIAGNOSIS — K5900 Constipation, unspecified: Secondary | ICD-10-CM | POA: Diagnosis present

## 2023-12-26 DIAGNOSIS — Z7902 Long term (current) use of antithrombotics/antiplatelets: Secondary | ICD-10-CM

## 2023-12-26 DIAGNOSIS — Z823 Family history of stroke: Secondary | ICD-10-CM

## 2023-12-26 DIAGNOSIS — Y846 Urinary catheterization as the cause of abnormal reaction of the patient, or of later complication, without mention of misadventure at the time of the procedure: Secondary | ICD-10-CM | POA: Diagnosis present

## 2023-12-26 DIAGNOSIS — R531 Weakness: Secondary | ICD-10-CM | POA: Diagnosis not present

## 2023-12-26 DIAGNOSIS — R6520 Severe sepsis without septic shock: Secondary | ICD-10-CM | POA: Diagnosis not present

## 2023-12-26 DIAGNOSIS — Z7982 Long term (current) use of aspirin: Secondary | ICD-10-CM

## 2023-12-26 HISTORY — DX: Unspecified fall, initial encounter: W19.XXXA

## 2023-12-26 LAB — CBC WITH DIFFERENTIAL/PLATELET
Abs Immature Granulocytes: 0.07 10*3/uL (ref 0.00–0.07)
Basophils Absolute: 0.1 10*3/uL (ref 0.0–0.1)
Basophils Relative: 0 %
Eosinophils Absolute: 0.2 10*3/uL (ref 0.0–0.5)
Eosinophils Relative: 1 %
HCT: 40.8 % (ref 39.0–52.0)
Hemoglobin: 13.2 g/dL (ref 13.0–17.0)
Immature Granulocytes: 1 %
Lymphocytes Relative: 10 %
Lymphs Abs: 1.5 10*3/uL (ref 0.7–4.0)
MCH: 30.2 pg (ref 26.0–34.0)
MCHC: 32.4 g/dL (ref 30.0–36.0)
MCV: 93.4 fL (ref 80.0–100.0)
Monocytes Absolute: 0.6 10*3/uL (ref 0.1–1.0)
Monocytes Relative: 4 %
Neutro Abs: 12.2 10*3/uL — ABNORMAL HIGH (ref 1.7–7.7)
Neutrophils Relative %: 84 %
Platelets: 318 10*3/uL (ref 150–400)
RBC: 4.37 MIL/uL (ref 4.22–5.81)
RDW: 14.2 % (ref 11.5–15.5)
WBC: 14.5 10*3/uL — ABNORMAL HIGH (ref 4.0–10.5)
nRBC: 0 % (ref 0.0–0.2)

## 2023-12-26 LAB — URINALYSIS, W/ REFLEX TO CULTURE (INFECTION SUSPECTED)
Bilirubin Urine: NEGATIVE
Glucose, UA: NEGATIVE mg/dL
Ketones, ur: NEGATIVE mg/dL
Nitrite: NEGATIVE
Protein, ur: 100 mg/dL — AB
RBC / HPF: >50 RBC/hpf (ref 0–5)
Specific Gravity, Urine: 1.025 (ref 1.005–1.030)
WBC, UA: >50 WBC/hpf (ref 0–5)
pH: 5 (ref 5.0–8.0)

## 2023-12-26 LAB — I-STAT VENOUS BLOOD GAS, ED
Acid-Base Excess: 1 mmol/L (ref 0.0–2.0)
Bicarbonate: 26.6 mmol/L (ref 20.0–28.0)
Calcium, Ion: 1.16 mmol/L (ref 1.15–1.40)
HCT: 41 % (ref 39.0–52.0)
Hemoglobin: 13.9 g/dL (ref 13.0–17.0)
O2 Saturation: 66 %
Potassium: 4.8 mmol/L (ref 3.5–5.1)
Sodium: 136 mmol/L (ref 135–145)
TCO2: 28 mmol/L (ref 22–32)
pCO2, Ven: 47.1 mmHg (ref 44–60)
pH, Ven: 7.36 (ref 7.25–7.43)
pO2, Ven: 36 mmHg (ref 32–45)

## 2023-12-26 LAB — COMPREHENSIVE METABOLIC PANEL WITH GFR
ALT: 14 U/L (ref 0–44)
AST: 17 U/L (ref 15–41)
Albumin: 3.6 g/dL (ref 3.5–5.0)
Alkaline Phosphatase: 78 U/L (ref 38–126)
Anion gap: 13 (ref 5–15)
BUN: 18 mg/dL (ref 8–23)
CO2: 25 mmol/L (ref 22–32)
Calcium: 9.4 mg/dL (ref 8.9–10.3)
Chloride: 97 mmol/L — ABNORMAL LOW (ref 98–111)
Creatinine, Ser: 0.81 mg/dL (ref 0.61–1.24)
GFR, Estimated: >60 mL/min (ref >60)
Glucose, Bld: 196 mg/dL — ABNORMAL HIGH (ref 70–99)
Potassium: 4.7 mmol/L (ref 3.5–5.1)
Sodium: 135 mmol/L (ref 135–145)
Total Bilirubin: 0.7 mg/dL (ref 0.0–1.2)
Total Protein: 7.5 g/dL (ref 6.5–8.1)

## 2023-12-26 LAB — CBG MONITORING, ED: Glucose-Capillary: 204 mg/dL — ABNORMAL HIGH (ref 70–99)

## 2023-12-26 LAB — GLUCOSE, CAPILLARY
Glucose-Capillary: 145 mg/dL — ABNORMAL HIGH (ref 70–99)
Glucose-Capillary: 140 mg/dL — ABNORMAL HIGH (ref 70–99)

## 2023-12-26 LAB — PROTIME-INR
INR: 1.1 (ref 0.8–1.2)
Prothrombin Time: 14.3 s (ref 11.4–15.2)

## 2023-12-26 LAB — APTT: aPTT: 31 s (ref 24–36)

## 2023-12-26 LAB — RESP PANEL BY RT-PCR (RSV, FLU A&B, COVID)  RVPGX2
Influenza A by PCR: NEGATIVE
Influenza B by PCR: NEGATIVE
Resp Syncytial Virus by PCR: NEGATIVE
SARS Coronavirus 2 by RT PCR: NEGATIVE

## 2023-12-26 LAB — I-STAT CG4 LACTIC ACID, ED
Lactic Acid, Venous: 2.4 mmol/L (ref 0.5–1.9)
Lactic Acid, Venous: 1.1 mmol/L (ref 0.5–1.9)

## 2023-12-26 MED ORDER — SODIUM CHLORIDE 0.9 % IV SOLN
1.0000 g | Freq: Once | INTRAVENOUS | Status: AC
  Administered 2023-12-26: 1 g via INTRAVENOUS
  Filled 2023-12-26: qty 10

## 2023-12-26 MED ORDER — ACETAMINOPHEN 650 MG RE SUPP: 650.0000 mg | Freq: Four times a day (QID) | RECTAL | Status: DC | PRN

## 2023-12-26 MED ORDER — SODIUM CHLORIDE 0.9% FLUSH
3.0000 mL | Freq: Two times a day (BID) | INTRAVENOUS | Status: DC
  Administered 2023-12-26 – 2023-12-30 (×9): 3 mL via INTRAVENOUS

## 2023-12-26 MED ORDER — INSULIN ASPART 100 UNIT/ML IJ SOLN
4.0000 [IU] | Freq: Three times a day (TID) | INTRAMUSCULAR | Status: DC
  Administered 2023-12-26 – 2023-12-31 (×15): 4 [IU] via SUBCUTANEOUS

## 2023-12-26 MED ORDER — INSULIN ASPART 100 UNIT/ML IJ SOLN
0.0000 [IU] | Freq: Every day | INTRAMUSCULAR | Status: DC
  Administered 2023-12-28 – 2023-12-30 (×3): 2 [IU] via SUBCUTANEOUS

## 2023-12-26 MED ORDER — INSULIN GLARGINE-YFGN 100 UNIT/ML ~~LOC~~ SOLN
10.0000 [IU] | Freq: Every day | SUBCUTANEOUS | Status: DC
  Administered 2023-12-26 – 2023-12-27 (×2): 10 [IU] via SUBCUTANEOUS
  Filled 2023-12-26 (×4): qty 0.1

## 2023-12-26 MED ORDER — SODIUM CHLORIDE 0.9 % IV BOLUS
500.0000 mL | Freq: Once | INTRAVENOUS | Status: AC
  Administered 2023-12-26: 500 mL via INTRAVENOUS

## 2023-12-26 MED ORDER — CHLORHEXIDINE GLUCONATE CLOTH 2 % EX PADS
6.0000 | MEDICATED_PAD | Freq: Every day | CUTANEOUS | Status: DC
  Administered 2023-12-27 – 2023-12-31 (×4): 6 via TOPICAL

## 2023-12-26 MED ORDER — LACTATED RINGERS IV BOLUS (SEPSIS)
500.0000 mL | Freq: Once | INTRAVENOUS | Status: AC
  Administered 2023-12-26: 500 mL via INTRAVENOUS

## 2023-12-26 MED ORDER — ACETAMINOPHEN 325 MG PO TABS: 650.0000 mg | ORAL_TABLET | Freq: Four times a day (QID) | ORAL | Status: DC | PRN

## 2023-12-26 MED ORDER — SORBITOL 70 % SOLN: 30.0000 mL | Freq: Every day | Status: DC | PRN

## 2023-12-26 MED ORDER — POLYETHYLENE GLYCOL 3350 17 G PO PACK
17.0000 g | PACK | Freq: Every day | ORAL | Status: DC | PRN
  Administered 2023-12-27: 17 g via ORAL
  Filled 2023-12-26: qty 1

## 2023-12-26 MED ORDER — TAMSULOSIN HCL 0.4 MG PO CAPS
0.4000 mg | ORAL_CAPSULE | Freq: Every day | ORAL | Status: DC
  Administered 2023-12-26 – 2023-12-30 (×5): 0.4 mg via ORAL
  Filled 2023-12-26 (×5): qty 1

## 2023-12-26 MED ORDER — INSULIN ASPART 100 UNIT/ML IJ SOLN: 0.0000 [IU] | Freq: Three times a day (TID) | INTRAMUSCULAR | Status: DC

## 2023-12-26 MED ORDER — ATORVASTATIN CALCIUM 10 MG PO TABS: 10.0000 mg | ORAL_TABLET | ORAL | Status: DC

## 2023-12-26 MED ORDER — MIDODRINE HCL 5 MG PO TABS
2.5000 mg | ORAL_TABLET | Freq: Three times a day (TID) | ORAL | Status: DC
Start: 2023-12-26 — End: 2023-12-31
  Administered 2023-12-26 – 2023-12-30 (×13): 2.5 mg via ORAL
  Filled 2023-12-26 (×14): qty 1

## 2023-12-26 MED ORDER — ASPIRIN 81 MG PO TBEC
81.0000 mg | DELAYED_RELEASE_TABLET | Freq: Every day | ORAL | Status: DC
  Administered 2023-12-26 – 2023-12-31 (×6): 81 mg via ORAL
  Filled 2023-12-26 (×6): qty 1

## 2023-12-26 MED ORDER — ENOXAPARIN SODIUM 40 MG/0.4ML IJ SOSY
40.0000 mg | PREFILLED_SYRINGE | INTRAMUSCULAR | Status: DC
  Administered 2023-12-26 – 2023-12-30 (×5): 40 mg via SUBCUTANEOUS
  Filled 2023-12-26 (×5): qty 0.4

## 2023-12-26 MED ORDER — SODIUM CHLORIDE 0.9 % IV SOLN
1.0000 g | INTRAVENOUS | Status: DC
Start: 2023-12-27 — End: 2023-12-28
  Administered 2023-12-27 – 2023-12-28 (×2): 1 g via INTRAVENOUS
  Filled 2023-12-26 (×2): qty 10

## 2023-12-26 MED ORDER — FLEET ENEMA RE ENEM: 1.0000 | ENEMA | Freq: Once | RECTAL | Status: DC | PRN

## 2023-12-26 MED ORDER — ACETAMINOPHEN 500 MG PO TABS
1000.0000 mg | ORAL_TABLET | Freq: Once | ORAL | Status: AC
  Administered 2023-12-26: 1000 mg via ORAL
  Filled 2023-12-26: qty 2

## 2023-12-26 MED ORDER — FLUDROCORTISONE ACETATE 0.1 MG PO TABS
0.2000 mg | ORAL_TABLET | Freq: Every day | ORAL | Status: DC
  Administered 2023-12-27 – 2023-12-31 (×5): 0.2 mg via ORAL
  Filled 2023-12-26 (×5): qty 2

## 2023-12-26 NOTE — Plan of Care (Signed)
  Problem: Respiratory: Goal: Ability to maintain adequate ventilation will improve Outcome: Progressing   Problem: Coping: Goal: Ability to adjust to condition or change in health will improve Outcome: Progressing   Problem: Nutritional: Goal: Maintenance of adequate nutrition will improve Outcome: Progressing   Problem: Tissue Perfusion: Goal: Adequacy of tissue perfusion will improve Outcome: Progressing   Problem: Education: Goal: Knowledge of General Education information will improve Description: Including pain rating scale, medication(s)/side effects and non-pharmacologic comfort measures Outcome: Progressing   Problem: Health Behavior/Discharge Planning: Goal: Ability to manage health-related needs will improve Outcome: Progressing   Problem: Clinical Measurements: Goal: Respiratory complications will improve Outcome: Progressing   Problem: Clinical Measurements: Goal: Cardiovascular complication will be avoided Outcome: Progressing   Problem: Nutrition: Goal: Adequate nutrition will be maintained Outcome: Progressing   Problem: Coping: Goal: Level of anxiety will decrease Outcome: Progressing   Problem: Pain Managment: Goal: General experience of comfort will improve and/or be controlled Outcome: Progressing   Problem: Safety: Goal: Ability to remain free from injury will improve Outcome: Progressing   Problem: Skin Integrity: Goal: Risk for impaired skin integrity will decrease Outcome: Progressing

## 2023-12-26 NOTE — Sepsis Progress Note (Signed)
 Code sepsis called @ 1346, pt received antibiotics @ 0913 this morning

## 2023-12-26 NOTE — ED Provider Notes (Signed)
 [place] EMERGENCY DEPARTMENT AT [facility] Provider Note  CSN: [id number] Arrival date & time: 12/26/23 0530  Chief Complaint(s) Catheter Problem  HPI Micheal Hair. is a 73 y.o. 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    He presents today for dysfunction of the urinary catheter.  Triage note reports that he mention the catheter was not draining well.  States he last emptied it yesterday morning.  There is approximately over 1000 cc in the catheter currently.  It does appear to be cloudy.  He is denying any abdominal pain.  Denies any chest pain or shortness of breath.  No nausea or vomiting.  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   MRSA infection 06/24/2023   History of MDR Pseudomonas aeruginosa infection 06/24/2023   Osteomyelitis (HCC) 06/19/2023   Ambulatory dysfunction 05/01/2023   Weakness 04/24/2023   Medication management 04/16/2023   Bacteremia 04/12/2023   Aspiration pneumonia (HCC) 04/11/2023   Pressure injury of skin 04/11/2023   Nausea and vomiting 04/11/2023   Elevated troponin 04/11/2023   CAD (coronary artery disease) 04/11/2023   Hypokalemia 04/11/2023   PVD (peripheral vascular disease) (HCC) 04/11/2023   Debility 04/11/2023   Hyperglycemia 01/16/2023   STEMI (ST elevation myocardial infarction) (HCC) 01/15/2023   Diabetic ketoacidosis without coma associated with type 2 diabetes  mellitus (HCC) 01/15/2023   Anterior dislocation of left hip (HCC) 08/24/2022   Closed dislocation of left hip (HCC) 08/23/2022   Normocytic anemia 08/23/2022   Protein-calorie malnutrition, moderate (HCC) 07/20/2022   Fall    Orthostatic hypotension    Hip fracture (HCC) 02/22/2022   Hyperlipidemia 02/22/2022   Chronic diastolic CHF (congestive heart failure) (HCC) 02/22/2022   Left great toe amputee (HCC) 05/06/2020   Cellulitis in diabetic foot (HCC) 04/27/2020   Osteomyelitis of great toe of left foot (HCC) 04/27/2020   Type 2 diabetes mellitus with hyperlipidemia (HCC) 04/27/2020   History of DVT (deep vein thrombosis) 04/27/2020   Tobacco abuse 04/27/2020   Home Medication(s) Prior to Admission medications   Medication Sig Start Date End Date Taking? Authorizing Provider  acetaminophen (TYLENOL) 325 MG tablet Take 2 tablets (650 mg total) by mouth every 6 (six) hours as needed for mild pain, fever or headache. 04/22/23   Lonia Blood, MD  ascorbic acid (VITAMIN C) 1000 MG tablet Take 1 tablet (1,000 mg total) by mouth daily. 06/30/23   Amin, Ankit C, MD  aspirin EC 81 MG tablet Take 1 tablet (81 mg total) by mouth daily. Swallow whole. 01/20/23   Lanae Boast, MD  atorvastatin (LIPITOR) 10 MG tablet One tablet by mouth twice a week    [provider]  barrier cream (NON-SPECIFIED) CREA Apply 1 Application topically 2 (two) times daily as needed. 05/04/23   Standley Brooking, MD  bisacodyl (DULCOLAX) 5 MG EC tablet Take 1 tablet (  5 mg total) by mouth daily as needed for moderate constipation or severe constipation. 06/30/23   Amin, Ankit C, MD  Blood Glucose Monitoring Suppl DEVI 1 each by Does not apply route 3 (three) times daily. May dispense any manufacturer covered by patient's insurance. 01/20/23   Lanae Boast, MD  BRILINTA 90 MG TABS tablet Take 90 mg by mouth 2 (two) times daily. 04/09/23   [provider]  docusate sodium (COLACE) 100 MG capsule Take 1 capsule  (100 mg total) by mouth daily. 06/30/23   Amin, Doreen Salvage, MD  Ensure Max Protein (ENSURE MAX PROTEIN) LIQD Take 330 mLs (11 oz total) by mouth 2 (two) times daily. Patient taking differently: Take 11 oz by mouth daily. 08/28/22   Lorin Glass, MD  fludrocortisone (FLORINEF) 0.1 MG tablet Take 2 tablets (0.2 mg total) by mouth daily. 06/30/23   Amin, Ankit C, MD  Glucagon, rDNA, (GLUCAGON EMERGENCY) 1 MG KIT Inject 1 mg into the skin as needed for up to 2 doses (Severe low blood sugar). 01/20/23   Lanae Boast, MD  Glucose Blood (BLOOD GLUCOSE TEST STRIPS) STRP 1 each by Does not apply route 3 (three) times daily. Use as directed to check blood sugar. May dispense any manufacturer covered by patient's insurance and fits patient's device. 01/20/23   Lanae Boast, MD  glycerin adult 2 g suppository Place 1 suppository rectally as needed for constipation. 12/02/23   Kommor, [place], MD  insulin aspart (NOVOLOG) 100 UNIT/ML FlexPen Inject 8 Units into the skin 3 (three) times daily with meals. Only take if eating a meal AND Blood Glucose (BG) is 80 or higher. 04/22/23 07/23/23  Lonia Blood, MD  insulin detemir (LEVEMIR) 100 UNIT/ML FlexPen Inject 20 Units into the skin at bedtime. DO NOT TAKE if your blood sugars and consistently running < 120 04/23/23   Lonia Blood, MD  Insulin Pen Needle (PEN NEEDLES) 31G X 5 MM MISC 1 each by Does not apply route 3 (three) times daily. May dispense any manufacturer covered by patient's insurance. 01/20/23   Lanae Boast, MD  JARDIANCE 10 MG TABS tablet Take 10 mg by mouth daily. 04/09/23   [provider]  Lancet Device MISC 1 each by Does not apply route 3 (three) times daily. May dispense any manufacturer covered by patient's insurance. 01/20/23   Lanae Boast, MD  Lancets MISC 1 each by Does not apply route 3 (three) times daily. Use as directed to check blood sugar. May dispense any manufacturer covered by patient's insurance and fits patient's device. 01/20/23   Lanae Boast, MD  magnesium citrate SOLN Take 296 mLs (1 Bottle total) by mouth daily as needed for severe constipation or moderate constipation. 07/05/23   Dorcas Carrow, MD  oxyCODONE (OXY IR/ROXICODONE) 5 MG immediate release tablet Take 1-2 tablets (5-10 mg total) by mouth every 4 (four) hours as needed for moderate pain, severe pain or breakthrough pain (pain score 4-6). 06/30/23   Amin, Ankit C, MD  polyethylene glycol (MIRALAX / GLYCOLAX) 17 g packet Take 17 g by mouth daily. 07/05/23   Dorcas Carrow, MD  senna (SENOKOT) 8.6 MG TABS tablet Take 1 tablet (8.6 mg total) by mouth 2 (two) times daily as needed for mild constipation or moderate constipation. 06/30/23   Amin, Ankit C, MD  tamsulosin (FLOMAX) 0.4 MG CAPS capsule Take 1 capsule (0.4 mg total) by mouth daily after supper. 06/30/23   Miguel Rota, MD  Allergies Patient has no known allergies.  Review of Systems Review of Systems As noted in HPI  Physical Exam Vital Signs  I have reviewed the triage vital signs BP 132/71 (BP Location: Right Arm)   Pulse (!) 130   Temp 100.2 F (37.9 C) (Oral)   Resp 18   Wt 88.5 kg   SpO2 98%   BMI 23.12 kg/m   Physical Exam Vitals reviewed.  Constitutional:      General: He is not in acute distress.    Appearance: He is well-developed. He is not diaphoretic.  HENT:     Head: Normocephalic and atraumatic.     Nose: Nose normal.  Eyes:     General: No scleral icterus.       Right eye: No discharge.        Left eye: No discharge.     Conjunctiva/sclera: Conjunctivae normal.     Pupils: Pupils are equal, round, and reactive to light.  Cardiovascular:     Rate and Rhythm: Regular rhythm. Tachycardia present.     Heart sounds: No murmur heard.    No friction rub. No gallop.  Pulmonary:     Effort: Pulmonary effort is normal. No respiratory distress.      Breath sounds: Normal breath sounds. No stridor. No rales.  Abdominal:     General: There is no distension.     Palpations: Abdomen is soft.     Tenderness: There is no abdominal tenderness. There is no guarding or rebound.  Genitourinary:    Comments: Foley in place. No leaking. No fissure. No erythema. Urine is cloudy with sediment. Non-bloody. Approx 1000cc+ in bag. Musculoskeletal:        General: No tenderness.     Cervical back: Normal range of motion and neck supple.  Skin:    General: Skin is warm and dry.     Findings: No erythema or rash.  Neurological:     Mental Status: He is alert and oriented to person, place, and time.     ED Results and Treatments Labs (all labs ordered are listed, but only abnormal results are displayed) Labs Reviewed  CBG MONITORING, ED - Abnormal; Notable for the following components:      Result Value   Glucose-Capillary 204 (*)    All other components within normal limits  CULTURE, BLOOD (ROUTINE X 2)  CULTURE, BLOOD (ROUTINE X 2)  RESP PANEL BY RT-PCR (RSV, FLU A&B, COVID)  [id number]  CBC WITH DIFFERENTIAL/PLATELET  COMPREHENSIVE METABOLIC PANEL WITH GFR  URINALYSIS, W/ REFLEX TO CULTURE (INFECTION SUSPECTED)  I-STAT VENOUS BLOOD GAS, ED  I-STAT CG4 LACTIC ACID, ED                                                                                                                         EKG  EKG Interpretation Date/Time:    Ventricular Rate:    PR Interval:    QRS Duration:    QT Interval:  QTC Calculation:   R Axis:      Text Interpretation:         Radiology DG Chest Port 1 View Result Date: 12/26/2023 CLINICAL DATA:  Fever EXAM: PORTABLE CHEST 1 VIEW COMPARISON:  11/04/2023 FINDINGS: Chronic interstitial coarsening at the lung bases with overall scar-like appearance that is stable. Some fibrosis seen at the right lung base on recent abdominal CT. There is no edema, consolidation, effusion, or pneumothorax. Normal heart size and  mediastinal contours. IMPRESSION: Pulmonary scarring without acute superimposed finding. Electronically Signed   By: Tiburcio Pea M.D.   On: 12/26/2023 07:29    Medications Ordered in ED Medications  acetaminophen (TYLENOL) tablet 1,000 mg (has no administration in time range)  sodium chloride 0.9 % bolus 500 mL (has no administration in time range)   Procedures Procedures  (including critical care time) Medical Decision Making / ED Course   Medical Decision Making Amount and/or Complexity of Data Reviewed Labs: ordered. Radiology: ordered.  Risk OTC drugs.    Patient noted to have low-grade fever out of 100.2 and tachycardic.  Urine in Foley bag is extremely cloudy and sediment is.  Will obtain sepsis workup.  Will replace catheter and obtain urine from new Foley catheter placed. Patient does not have any abdominal tenderness to palpation concerning for serious intra-abdominal inflammatory/infectious process requiring imaging at this time.  Given history of diabetes, will also assess for electrolyte/metabolic derangements including DKA.  Patient care turned over to oncoming provider. Patient case and results discussed in detail; please see their note for further ED managment.       Final Clinical Impression(s) / ED Diagnoses Final diagnoses:  None    This chart was dictated using voice recognition software.  Despite best efforts to proofread,  errors can occur which can change the documentation meaning.    Nira Conn, MD 12/26/23 [phone]

## 2023-12-26 NOTE — Sepsis Progress Note (Signed)
 Elink monitoring for the code sepsis protocol.

## 2023-12-26 NOTE — Progress Notes (Signed)
 Patient transferred from ED via bed. On RA. Connected to telemetry box 10. Alert and oriented. Will continue to monitor

## 2023-12-26 NOTE — ED Triage Notes (Signed)
 Pt BIB EMS with c/o issues with his urinary catheter. Per pt, urine has not been draining from his catheter since yesterday. Pt denies fevers or ABD pain.

## 2023-12-26 NOTE — ED Notes (Signed)
 Ccmd called pt on the moniter

## 2023-12-26 NOTE — Plan of Care (Signed)

## 2023-12-26 NOTE — H&P (Addendum)
 History and Physical   Micheal Moore. ZYS:[id number] DOB: 1951-03-27 DOA: 12/26/2023  PCP: Pcp, No   Patient coming from: Home  Chief Complaint: Foley catheter issue  HPI: Micheal Moore. is a 73 y.o. male with medical history significant of hyperlipidemia, diabetes, CAD, PVD, anemia, diastolic CHF, left greater potation, DVT presenting with catheter dysfunction.  Patient was seen in the ED on 3/17 for constipation and urinary retention.  Had to be disimpacted due to his constipation and had a Foley catheter placed.  Presenting today due to Foley catheter not draining well.  Denies fevers, chills, chest pain, short of breath, abdominal pain, constipation, diarrhea, nausea, vomiting.  ED Course: Vital signs in the ED notable for temperature of 100.2, heart rate in the 100s-120s, blood pressure in the 100s-140s.  Lab workup included CMP with chloride 97, glucose 196.  CBC with leukocytosis to 14.5.  Lactic acid initially mildly elevated at 2.4 and returned to normal on repeat.  Respiratory panel for flu COVID RSV negative.  Urinalysis with hemoglobin, protein, leukocytes, bacteria.  Urine culture and blood culture pending.  VBG with normal pH and normal pCO2.  Chest x-ray with pulmonary scarring but no acute superimposed finding.  Patient CIWA Tylenol, ceftriaxone, 500 cc IV fluids and a new Foley was placed.  Review of Systems: As per HPI otherwise all other systems reviewed and are negative.  Past Medical History:  Diagnosis Date   Cancer [facility])    right elbow melanoma   Diabetes mellitus without complication (HCC)    Type II   Diabetic ketoacidosis without coma associated with type 2 diabetes mellitus (HCC) 01/15/2023   Dizziness    DVT (deep venous thrombosis) (HCC)    right leg   Frequent falls    Near syncope    Syncope     Past Surgical History:  Procedure Laterality Date   AMPUTATION Left 06/28/2023   Procedure: LEFT ABOVE KNEE AMPUTATION;  Surgeon:  Nadara Mustard, MD;  Location: [facility] OR;  Service: Orthopedics;  Laterality: Left;   AMPUTATION TOE Left 04/28/2020   Procedure: AMPUTATION LEFT GREAT TOE;  Surgeon: Nadara Mustard, MD;  Location: [facility] OR;  Service: Orthopedics;  Laterality: Left;   CORONARY/GRAFT ACUTE MI REVASCULARIZATION N/A 01/15/2023   Procedure: Coronary/Graft Acute MI Revascularization;  Surgeon: Orbie Pyo, MD;  Location: [facility] INVASIVE CV LAB;  Service: Cardiovascular;  Laterality: N/A;   INTRAMEDULLARY (IM) NAIL INTERTROCHANTERIC Right 02/23/2022   Procedure: INTRAMEDULLARY (IM) NAIL INTERTROCHANTRIC, RIGHT;  Surgeon: Samson Frederic, MD;  Location: [facility] ORS;  Service: Orthopedics;  Laterality: Right;   LEFT HEART CATH AND CORONARY ANGIOGRAPHY N/A 01/15/2023   Procedure: LEFT HEART CATH AND CORONARY ANGIOGRAPHY;  Surgeon: Orbie Pyo, MD;  Location: [facility] INVASIVE CV LAB;  Service: Cardiovascular;  Laterality: N/A;   LOWER EXTREMITY ANGIOGRAPHY Bilateral 01/18/2023   Procedure: Lower Extremity Angiography;  Surgeon: Nada Libman, MD;  Location: [facility] INVASIVE CV LAB;  Service: Cardiovascular;  Laterality: Bilateral;   MELANOMA EXCISION WITH SENTINEL LYMPH NODE BIOPSY Right 08/19/2020   Procedure: WIDE LOCAL EXCISION RIGHT ELBOW MELANOMA WITH RIGHT AXILLARY SENTINEL LYMPH NODE BIOPSY;  Surgeon: Fritzi Mandes, MD;  Location: [facility] OR;  Service: General;  Laterality: Right;  2nd incision in axilla   TEE WITHOUT CARDIOVERSION N/A 04/19/2023   Procedure: TRANSESOPHAGEAL ECHOCARDIOGRAM;  Surgeon: Little Ishikawa, MD;  Location: [facility] INVASIVE CV LAB;  Service: Cardiovascular;  Laterality: N/A;   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   Procedure:  TOTAL HIP ARTHROPLASTY ANTERIOR APPROACH;  Surgeon: Samson Frederic, MD;  Location: [facility] ORS;  Service: Orthopedics;  Laterality: Left;   TOTAL HIP ARTHROPLASTY Left 08/24/2022   Procedure: OPEN REDUCTION, HEAD AND LINER EXCHANGE;  Surgeon: Samson Frederic, MD;  Location: [facility] ORS;  Service:  Orthopedics;  Laterality: Left;    Social History  reports that he has been smoking cigarettes. He has a 3.8 pack-year smoking history. He has never used smokeless tobacco. He reports that he does not currently use drugs. He reports that he does not drink alcohol.  No Known Allergies  Family History  Problem Relation Age of Onset   Stroke Mother    Diabetes Father    Stroke Sister    Diabetes Sister   Reviewed admission  Prior to Admission medications   Medication Sig Start Date End Date Taking? Authorizing Provider  acetaminophen (TYLENOL) 325 MG tablet Take 2 tablets (650 mg total) by mouth every 6 (six) hours as needed for mild pain, fever or headache. 04/22/23   Lonia Blood, MD  ascorbic acid (VITAMIN C) 1000 MG tablet Take 1 tablet (1,000 mg total) by mouth daily. 06/30/23   Amin, Ankit C, MD  aspirin EC 81 MG tablet Take 1 tablet (81 mg total) by mouth daily. Swallow whole. 01/20/23   Lanae Boast, MD  atorvastatin (LIPITOR) 10 MG tablet One tablet by mouth twice a week    [provider]  barrier cream (NON-SPECIFIED) CREA Apply 1 Application topically 2 (two) times daily as needed. 05/04/23   Standley Brooking, MD  bisacodyl (DULCOLAX) 5 MG EC tablet Take 1 tablet (5 mg total) by mouth daily as needed for moderate constipation or severe constipation. 06/30/23   Amin, Ankit C, MD  Blood Glucose Monitoring Suppl DEVI 1 each by Does not apply route 3 (three) times daily. May dispense any manufacturer covered by patient's insurance. 01/20/23   Lanae Boast, MD  BRILINTA 90 MG TABS tablet Take 90 mg by mouth 2 (two) times daily. 04/09/23   [provider]  docusate sodium (COLACE) 100 MG capsule Take 1 capsule (100 mg total) by mouth daily. 06/30/23   Amin, Doreen Salvage, MD  Ensure Max Protein (ENSURE MAX PROTEIN) LIQD Take 330 mLs (11 oz total) by mouth 2 (two) times daily. Patient taking differently: Take 11 oz by mouth daily. 08/28/22   Lorin Glass, MD  fludrocortisone  (FLORINEF) 0.1 MG tablet Take 2 tablets (0.2 mg total) by mouth daily. 06/30/23   Amin, Ankit C, MD  Glucagon, rDNA, (GLUCAGON EMERGENCY) 1 MG KIT Inject 1 mg into the skin as needed for up to 2 doses (Severe low blood sugar). 01/20/23   Lanae Boast, MD  Glucose Blood (BLOOD GLUCOSE TEST STRIPS) STRP 1 each by Does not apply route 3 (three) times daily. Use as directed to check blood sugar. May dispense any manufacturer covered by patient's insurance and fits patient's device. 01/20/23   Lanae Boast, MD  glycerin adult 2 g suppository Place 1 suppository rectally as needed for constipation. 12/02/23   Kommor, [place], MD  insulin aspart (NOVOLOG) 100 UNIT/ML FlexPen Inject 8 Units into the skin 3 (three) times daily with meals. Only take if eating a meal AND Blood Glucose (BG) is 80 or higher. 04/22/23 07/23/23  Lonia Blood, MD  insulin detemir (LEVEMIR) 100 UNIT/ML FlexPen Inject 20 Units into the skin at bedtime. DO NOT TAKE if your blood sugars and consistently running < 120 04/23/23   Lonia Blood,  MD  Insulin Pen Needle (PEN NEEDLES) 31G X 5 MM MISC 1 each by Does not apply route 3 (three) times daily. May dispense any manufacturer covered by patient's insurance. 01/20/23   Lanae Boast, MD  JARDIANCE 10 MG TABS tablet Take 10 mg by mouth daily. 04/09/23   [provider]  Lancet Device MISC 1 each by Does not apply route 3 (three) times daily. May dispense any manufacturer covered by patient's insurance. 01/20/23   Lanae Boast, MD  Lancets MISC 1 each by Does not apply route 3 (three) times daily. Use as directed to check blood sugar. May dispense any manufacturer covered by patient's insurance and fits patient's device. 01/20/23   Lanae Boast, MD  magnesium citrate SOLN Take 296 mLs (1 Bottle total) by mouth daily as needed for severe constipation or moderate constipation. 07/05/23   Dorcas Carrow, MD  oxyCODONE (OXY IR/ROXICODONE) 5 MG immediate release tablet Take 1-2 tablets (5-10 mg total) by  mouth every 4 (four) hours as needed for moderate pain, severe pain or breakthrough pain (pain score 4-6). 06/30/23   Amin, Ankit C, MD  polyethylene glycol (MIRALAX / GLYCOLAX) 17 g packet Take 17 g by mouth daily. 07/05/23   Dorcas Carrow, MD  senna (SENOKOT) 8.6 MG TABS tablet Take 1 tablet (8.6 mg total) by mouth 2 (two) times daily as needed for mild constipation or moderate constipation. 06/30/23   Amin, Ankit C, MD  tamsulosin (FLOMAX) 0.4 MG CAPS capsule Take 1 capsule (0.4 mg total) by mouth daily after supper. 06/30/23   Miguel Rota, MD    Physical Exam: Vitals:   12/26/23 1123 12/26/23 1145 12/26/23 1148 12/26/23 1148  BP:   103/64   Pulse:  (!) 106 (!) 103   Resp:  (!) 21 20   Temp: 99.4 F (37.4 C)   97.8 F (36.6 C)  TempSrc: Oral   Oral  SpO2:  100% 100%   Weight:        Physical Exam Constitutional:      General: He is not in acute distress.    Appearance: Normal appearance.  HENT:     Head: Normocephalic and atraumatic.     Mouth/Throat:     Mouth: Mucous membranes are moist.     Pharynx: Oropharynx is clear.  Eyes:     Extraocular Movements: Extraocular movements intact.     Pupils: Pupils are equal, round, and reactive to light.  Cardiovascular:     Rate and Rhythm: Normal rate and regular rhythm.     Pulses: Normal pulses.     Heart sounds: Normal heart sounds.  Pulmonary:     Effort: Pulmonary effort is normal. No respiratory distress.     Breath sounds: Normal breath sounds.  Abdominal:     General: Bowel sounds are normal. There is no distension.     Palpations: Abdomen is soft.     Tenderness: There is no abdominal tenderness.  Genitourinary:    Comments: Foley Musculoskeletal:        General: No swelling or deformity.     Comments: S/P L BKA  Skin:    General: Skin is warm and dry.  Neurological:     General: No focal deficit present.     Mental Status: Mental status is at baseline.    Labs on Admission: I have personally reviewed  following labs and imaging studies  CBC: Recent Labs  Lab 12/26/23 0750 12/26/23 0759  WBC 14.5*  --   NEUTROABS  12.2*  --   HGB 13.2 13.9  HCT 40.8 41.0  MCV 93.4  --   PLT 318  --     Basic Metabolic Panel: Recent Labs  Lab 12/26/23 0750 12/26/23 0759  NA 135 136  K 4.7 4.8  CL 97*  --   CO2 25  --   GLUCOSE 196*  --   BUN 18  --   CREATININE 0.81  --   CALCIUM 9.4  --     GFR: Estimated Creatinine Clearance: 101.7 mL/min (by C-G formula based on SCr of 0.81 mg/dL).  Liver Function Tests: Recent Labs  Lab 12/26/23 0750  AST 17  ALT 14  ALKPHOS 78  BILITOT 0.7  PROT 7.5  ALBUMIN 3.6    Urine analysis:    Component Value Date/Time   COLORURINE YELLOW 12/26/2023 1136   APPEARANCEUR TURBID (A) 12/26/2023 1136   APPEARANCEUR Clear 12/04/2021 1430   LABSPEC 1.025 12/26/2023 1136   PHURINE 5.0 12/26/2023 1136   GLUCOSEU NEGATIVE 12/26/2023 1136   HGBUR MODERATE (A) 12/26/2023 1136   BILIRUBINUR NEGATIVE 12/26/2023 1136   BILIRUBINUR Negative 12/04/2021 1430   KETONESUR NEGATIVE 12/26/2023 1136   PROTEINUR 100 (A) 12/26/2023 1136   NITRITE NEGATIVE 12/26/2023 1136   LEUKOCYTESUR LARGE (A) 12/26/2023 1136    Radiological Exams on Admission: DG Chest Port 1 View Result Date: 12/26/2023 CLINICAL DATA:  Fever EXAM: PORTABLE CHEST 1 VIEW COMPARISON:  11/04/2023 FINDINGS: Chronic interstitial coarsening at the lung bases with overall scar-like appearance that is stable. Some fibrosis seen at the right lung base on recent abdominal CT. There is no edema, consolidation, effusion, or pneumothorax. Normal heart size and mediastinal contours. IMPRESSION: Pulmonary scarring without acute superimposed finding. Electronically Signed   By: Tiburcio Pea M.D.   On: 12/26/2023 07:29   EKG: Independently reviewed.  Sinus tachycardia 128 bpm.  Assessment/Plan Principal Problem:   Sepsis secondary to UTI [facility]) Active Problems:   CAD (coronary artery disease)   Type 2  diabetes mellitus with hyperlipidemia (HCC)   Normocytic anemia   PVD (peripheral vascular disease) (HCC)   Hyperlipidemia   History of DVT (deep vein thrombosis)   Left great toe amputee (HCC)   Chronic diastolic CHF (congestive heart failure) (HCC)   Sepsis secondary to UTI > Patient initially presented with complaints of catheter dysfunction, this has been exchanged in the ED.   > Found to have elevated temperature 100.2.  Tachycardia to the 120s, leukocytosis to 14.5, urinalysis with hemoglobin, leukocytes, bacteria.  This meets sepsis criteria with source of UTI. > Initial lactic acid 2.4 improved to 1.1 after IV fluids.   > Urine culture and blood culture pending.  Received ceftriaxone in the ED. - Monitor on telemetry overnight - Give additional 500 cc IV fluids - Continue with ceftriaxone - Trend fever curve and WBC - Supportive care  Hyperlipidemia - Continue home atorvastatin  Diabetes > 20 units long-acting per chart, patient unsure of dose. No longer taking mealtime insulin. - 10 units long-acting insulin qhs for now - SSI   CAD > Status post stent in 2024 - Continue home ASA, atorvastatin  PVD - Continue home ASA and atorvastatin  Orthostatic hypotension - Continue midodrine and fludrocortisone  Chronic diastolic CHF > Last echo was TEE in 2024 with EF 55-60%, normal RV function. - Not on diuretic  History of DVT Status post left BKA - Noted  Disposition/medication needs on discharge: Pharmacy staff have noted that patient and SO was hoping  for a prescription for Ensure so insurance helps cover it due to cost. Also patient has been out medication for over a week and is requesting refills due to having been unable to get a doctor visit outpatient: Aspirin 81mg , Atorvastatin 10mg , Fludrocortisone 0.1mg , Hydroxyzine 25mg , Novolog 100unit/mL, Levemir 100unit/mL, Ondansetron 4mg , Rosuvastatin 5mg , Tamsulosin 0.4mg .   DVT prophylaxis: Lovenox Code Status:    Full Family Communication:  None on admission  Disposition Plan:   Patient is from:  Home  Anticipated DC to:  Home  Anticipated DC date:  1 to 3 days  Anticipated DC barriers: None  Consults called:  None Admission status:  Observation, telemetry  Severity of Illness: The appropriate patient status for this patient is OBSERVATION. Observation status is judged to be reasonable and necessary in order to provide the required intensity of service to ensure the patient's safety. The patient's presenting symptoms, physical exam findings, and initial radiographic and laboratory data in the context of their medical condition is felt to place them at decreased risk for further clinical deterioration. Furthermore, it is anticipated that the patient will be medically stable for discharge from the hospital within 2 midnights of admission.    Synetta Fail MD [facility]  How to contact the [facility] Attending or Consulting provider 7A - 7P or covering provider during after hours 7P -7A, for this patient?   Check the care team in [facility] and look for a) attending/consulting [facility] provider listed and b) the [facility] team listed Log into [email or web address] and use [place]'s universal password to access. If you do not have the password, please contact the hospital operator. Locate the [facility] provider you are looking for under [facility] and page to a number that you can be directly reached. If you still have difficulty reaching the provider, please page the [facility] (Director on Call) for the Hospitalists listed on amion for assistance.  12/26/2023, 1:47 PM

## 2023-12-27 DIAGNOSIS — R6520 Severe sepsis without septic shock: Secondary | ICD-10-CM | POA: Diagnosis present

## 2023-12-27 DIAGNOSIS — Z794 Long term (current) use of insulin: Secondary | ICD-10-CM | POA: Diagnosis not present

## 2023-12-27 DIAGNOSIS — A4102 Sepsis due to Methicillin resistant Staphylococcus aureus: Secondary | ICD-10-CM | POA: Diagnosis present

## 2023-12-27 DIAGNOSIS — I2510 Atherosclerotic heart disease of native coronary artery without angina pectoris: Secondary | ICD-10-CM | POA: Diagnosis present

## 2023-12-27 DIAGNOSIS — Z85820 Personal history of malignant melanoma of skin: Secondary | ICD-10-CM | POA: Diagnosis not present

## 2023-12-27 DIAGNOSIS — T83511A Infection and inflammatory reaction due to indwelling urethral catheter, initial encounter: Secondary | ICD-10-CM | POA: Diagnosis not present

## 2023-12-27 DIAGNOSIS — Z22322 Carrier or suspected carrier of Methicillin resistant Staphylococcus aureus: Secondary | ICD-10-CM | POA: Diagnosis not present

## 2023-12-27 DIAGNOSIS — N390 Urinary tract infection, site not specified: Secondary | ICD-10-CM | POA: Diagnosis present

## 2023-12-27 DIAGNOSIS — K5900 Constipation, unspecified: Secondary | ICD-10-CM | POA: Diagnosis present

## 2023-12-27 DIAGNOSIS — Z96642 Presence of left artificial hip joint: Secondary | ICD-10-CM | POA: Diagnosis present

## 2023-12-27 DIAGNOSIS — I110 Hypertensive heart disease with heart failure: Secondary | ICD-10-CM | POA: Diagnosis present

## 2023-12-27 DIAGNOSIS — F17210 Nicotine dependence, cigarettes, uncomplicated: Secondary | ICD-10-CM | POA: Diagnosis present

## 2023-12-27 DIAGNOSIS — B9562 Methicillin resistant Staphylococcus aureus infection as the cause of diseases classified elsewhere: Secondary | ICD-10-CM | POA: Diagnosis present

## 2023-12-27 DIAGNOSIS — Z89512 Acquired absence of left leg below knee: Secondary | ICD-10-CM | POA: Diagnosis not present

## 2023-12-27 DIAGNOSIS — E872 Acidosis, unspecified: Secondary | ICD-10-CM | POA: Diagnosis present

## 2023-12-27 DIAGNOSIS — E785 Hyperlipidemia, unspecified: Secondary | ICD-10-CM | POA: Diagnosis present

## 2023-12-27 DIAGNOSIS — I5032 Chronic diastolic (congestive) heart failure: Secondary | ICD-10-CM | POA: Diagnosis present

## 2023-12-27 DIAGNOSIS — A419 Sepsis, unspecified organism: Secondary | ICD-10-CM | POA: Diagnosis present

## 2023-12-27 DIAGNOSIS — R338 Other retention of urine: Secondary | ICD-10-CM | POA: Diagnosis present

## 2023-12-27 DIAGNOSIS — Z89612 Acquired absence of left leg above knee: Secondary | ICD-10-CM | POA: Diagnosis not present

## 2023-12-27 DIAGNOSIS — E1151 Type 2 diabetes mellitus with diabetic peripheral angiopathy without gangrene: Secondary | ICD-10-CM | POA: Diagnosis present

## 2023-12-27 DIAGNOSIS — Y846 Urinary catheterization as the cause of abnormal reaction of the patient, or of later complication, without mention of misadventure at the time of the procedure: Secondary | ICD-10-CM | POA: Diagnosis present

## 2023-12-27 DIAGNOSIS — Z1152 Encounter for screening for COVID-19: Secondary | ICD-10-CM | POA: Diagnosis not present

## 2023-12-27 DIAGNOSIS — E1169 Type 2 diabetes mellitus with other specified complication: Secondary | ICD-10-CM | POA: Diagnosis present

## 2023-12-27 DIAGNOSIS — Z79899 Other long term (current) drug therapy: Secondary | ICD-10-CM | POA: Diagnosis not present

## 2023-12-27 DIAGNOSIS — D649 Anemia, unspecified: Secondary | ICD-10-CM | POA: Diagnosis present

## 2023-12-27 DIAGNOSIS — N401 Enlarged prostate with lower urinary tract symptoms: Secondary | ICD-10-CM | POA: Diagnosis present

## 2023-12-27 DIAGNOSIS — I951 Orthostatic hypotension: Secondary | ICD-10-CM | POA: Diagnosis present

## 2023-12-27 DIAGNOSIS — T83518A Infection and inflammatory reaction due to other urinary catheter, initial encounter: Secondary | ICD-10-CM | POA: Diagnosis present

## 2023-12-27 LAB — CBC
HCT: 32.5 % — ABNORMAL LOW (ref 39.0–52.0)
Hemoglobin: 10.8 g/dL — ABNORMAL LOW (ref 13.0–17.0)
MCH: 30.3 pg (ref 26.0–34.0)
MCHC: 33.2 g/dL (ref 30.0–36.0)
MCV: 91.3 fL (ref 80.0–100.0)
Platelets: 244 10*3/uL (ref 150–400)
RBC: 3.56 MIL/uL — ABNORMAL LOW (ref 4.22–5.81)
RDW: 14.2 % (ref 11.5–15.5)
WBC: 8.8 10*3/uL (ref 4.0–10.5)
nRBC: 0 % (ref 0.0–0.2)

## 2023-12-27 LAB — COMPREHENSIVE METABOLIC PANEL WITH GFR
ALT: 10 U/L (ref 0–44)
AST: 11 U/L — ABNORMAL LOW (ref 15–41)
Albumin: 2.8 g/dL — ABNORMAL LOW (ref 3.5–5.0)
Alkaline Phosphatase: 69 U/L (ref 38–126)
Anion gap: 8 (ref 5–15)
BUN: 15 mg/dL (ref 8–23)
CO2: 23 mmol/L (ref 22–32)
Calcium: 8.6 mg/dL — ABNORMAL LOW (ref 8.9–10.3)
Chloride: 100 mmol/L (ref 98–111)
Creatinine, Ser: 0.85 mg/dL (ref 0.61–1.24)
GFR, Estimated: >60 mL/min (ref >60)
Glucose, Bld: 221 mg/dL — ABNORMAL HIGH (ref 70–99)
Potassium: 3.9 mmol/L (ref 3.5–5.1)
Sodium: 131 mmol/L — ABNORMAL LOW (ref 135–145)
Total Bilirubin: 1 mg/dL (ref 0.0–1.2)
Total Protein: 6.2 g/dL — ABNORMAL LOW (ref 6.5–8.1)

## 2023-12-27 LAB — MRSA NEXT GEN BY PCR, NASAL: MRSA by PCR Next Gen: DETECTED — AB

## 2023-12-27 LAB — GLUCOSE, CAPILLARY
Glucose-Capillary: 194 mg/dL — ABNORMAL HIGH (ref 70–99)
Glucose-Capillary: 221 mg/dL — ABNORMAL HIGH (ref 70–99)
Glucose-Capillary: 265 mg/dL — ABNORMAL HIGH (ref 70–99)
Glucose-Capillary: 233 mg/dL — ABNORMAL HIGH (ref 70–99)
Glucose-Capillary: 184 mg/dL — ABNORMAL HIGH (ref 70–99)

## 2023-12-27 MED ORDER — DOCUSATE SODIUM 100 MG PO CAPS
100.0000 mg | ORAL_CAPSULE | Freq: Two times a day (BID) | ORAL | Status: DC
  Administered 2023-12-27 – 2023-12-30 (×8): 100 mg via ORAL
  Filled 2023-12-27 (×9): qty 1

## 2023-12-27 MED ORDER — METOPROLOL TARTRATE 5 MG/5ML IV SOLN: 5.0000 mg | INTRAVENOUS | Status: DC | PRN

## 2023-12-27 MED ORDER — CHLORHEXIDINE GLUCONATE CLOTH 2 % EX PADS: 6.0000 | MEDICATED_PAD | Freq: Every day | CUTANEOUS | Status: DC

## 2023-12-27 MED ORDER — GUAIFENESIN 100 MG/5ML PO LIQD: 5.0000 mL | ORAL | Status: DC | PRN

## 2023-12-27 MED ORDER — MUPIROCIN 2 % EX OINT
1.0000 | TOPICAL_OINTMENT | Freq: Two times a day (BID) | CUTANEOUS | Status: DC
  Filled 2023-12-27: qty 22

## 2023-12-27 MED ORDER — MUPIROCIN 2 % EX OINT
1.0000 | TOPICAL_OINTMENT | Freq: Two times a day (BID) | CUTANEOUS | Status: DC
  Administered 2023-12-27 – 2023-12-29 (×5): 1 via NASAL
  Filled 2023-12-27 (×2): qty 22

## 2023-12-27 MED ORDER — CHLORHEXIDINE GLUCONATE CLOTH 2 % EX PADS
6.0000 | MEDICATED_PAD | Freq: Every day | CUTANEOUS | Status: AC
  Administered 2023-12-27 – 2023-12-31 (×5): 6 via TOPICAL

## 2023-12-27 MED ORDER — MAGNESIUM CITRATE PO SOLN
1.0000 | Freq: Once | ORAL | Status: AC
  Administered 2023-12-27: 1 via ORAL
  Filled 2023-12-27: qty 296

## 2023-12-27 MED ORDER — POLYETHYLENE GLYCOL 3350 17 G PO PACK
17.0000 g | PACK | Freq: Every day | ORAL | Status: DC
  Administered 2023-12-28 – 2023-12-30 (×3): 17 g via ORAL
  Filled 2023-12-27 (×3): qty 1

## 2023-12-27 MED ORDER — HYDRALAZINE HCL 20 MG/ML IJ SOLN: 10.0000 mg | INTRAMUSCULAR | Status: DC | PRN

## 2023-12-27 MED ORDER — IPRATROPIUM-ALBUTEROL 0.5-2.5 (3) MG/3ML IN SOLN: 3.0000 mL | RESPIRATORY_TRACT | Status: DC | PRN

## 2023-12-27 NOTE — Progress Notes (Signed)
 PROGRESS NOTE    Micheal Moore.  MVH:[id number] DOB: 1951/04/22 DOA: 12/26/2023 PCP: Pcp, No    Brief Narrative:    73 y.o. male with medical history significant of hyperlipidemia, diabetes, CAD status post PCI April 2024, PVD, anemia, diastolic CHF, left greater potation, DVT presenting with catheter dysfunction.  He was in the ER on 3/17 for constipation and urinary retention.  He was disimpacted and Foley catheter was placed.  Upon admission Foley is not draining well and was found to have sepsis secondary to urinary tract infection started on IV fluids and Rocephin.  Assessment & Plan:  Principal Problem:   Sepsis secondary to UTI [facility]) Active Problems:   CAD (coronary artery disease)   Type 2 diabetes mellitus with hyperlipidemia (HCC)   Normocytic anemia   PVD (peripheral vascular disease) (HCC)   Hyperlipidemia   History of DVT (deep vein thrombosis)   Hx of left BKA (HCC)   Chronic diastolic CHF (congestive heart failure) (HCC)   Sepsis secondary to catheter associated UTI History of BPH - Sepsis physiology is improving.  Follow culture data.  Currently on empiric IV Rocephin, IV fluids as necessary.  Foley catheter changed in ER - Continue Flomax  Hyperlipidemia - Statin  Insulin-dependent diabetes mellitus type 2 - On insulin, sliding scale and Accu-Chek.  Adjust as necessary  CAD status post PCI 2024 Peripheral vascular disease - On aspirin and statin  Orthostatic hypotension - On midodrine and fludrocortisone  Chronic diastolic CHF with preserved EF 60% - Not on diuretics  History of DVT status post left BKA - Supportive care.   DVT prophylaxis: enoxaparin (LOVENOX) injection 40 mg Start: 12/26/23 1400      Code Status: Full Code Family Communication:   Continue hospital stay for IV antibiotics    Subjective: Seen and examined at bedside.  Still feels weak but better compared to yesterday.   Examination:  General exam: Appears  calm and comfortable  Respiratory system: Clear to auscultation. Respiratory effort normal. Cardiovascular system: S1 & S2 heard, RRR. No JVD, murmurs, rubs, gallops or clicks. No pedal edema. Gastrointestinal system: Abdomen is nondistended, soft and nontender. No organomegaly or masses felt. Normal bowel sounds heard. Central nervous system: Alert and oriented. No focal neurological deficits. Extremities: Status post left BKA Skin: No rashes, lesions or ulcers Psychiatry: Judgement and insight appear normal. Mood & affect appropriate. Foley in place, changed in ER               Diet Orders (From admission, onward)     Start     Ordered   12/26/23 1346  Diet Carb Modified Fluid consistency: Thin; Room service appropriate? Yes  Diet effective now       Question Answer Comment  Diet-HS Snack? Nothing   Calorie Level Medium 1600-2000   Fluid consistency: Thin   Room service appropriate? Yes      12/26/23 1345            Objective: Vitals:   12/26/23 2018 12/27/23 0011 12/27/23 0435 12/27/23 0818  BP: (!) 140/67 124/71 112/66 127/68  Pulse: (!) 121 (!) 108 100 (!) 101  Resp: 16 16 16    Temp: 99.8 F (37.7 C) 98.8 F (37.1 C) 99.3 F (37.4 C) 98.6 F (37 C)  TempSrc: Oral Oral Oral Oral  SpO2: 100% 96% 95% 98%  Weight:        Intake/Output Summary (Last 24 hours) at 12/27/2023 1155 Last data filed at 12/27/2023 0439 Gross per  24 hour  Intake --  Output 800 ml  Net -800 ml   Filed Weights   12/26/23 0542  Weight: 88.5 kg    Scheduled Meds:  aspirin EC  81 mg Oral Daily   [START ON 12/31/2023] atorvastatin  10 mg Oral Once per day on Monday Thursday   Chlorhexidine Gluconate Cloth  6 each Topical Daily   Chlorhexidine Gluconate Cloth  6 each Topical Daily   docusate sodium  100 mg Oral BID   enoxaparin (LOVENOX) injection  40 mg Subcutaneous Q24H   fludrocortisone  0.2 mg Oral Daily   insulin aspart  0-5 Units Subcutaneous QHS   insulin aspart  4  Units Subcutaneous TID WC   insulin glargine-yfgn  10 Units Subcutaneous QHS   midodrine  2.5 mg Oral TID   mupirocin ointment  1 Application Nasal BID   polyethylene glycol  17 g Oral Daily   sodium chloride flush  3 mL Intravenous Q12H   tamsulosin  0.4 mg Oral QPC supper   Continuous Infusions:  cefTRIAXone (ROCEPHIN)  IV 1 g (12/27/23 0940)    Nutritional status     Body mass index is 23.12 kg/m.  Data Reviewed:   CBC: Recent Labs  Lab 12/26/23 0750 12/26/23 0759 12/27/23 0756  WBC 14.5*  --  8.8  NEUTROABS 12.2*  --   --   HGB 13.2 13.9 10.8*  HCT 40.8 41.0 32.5*  MCV 93.4  --  91.3  PLT 318  --  244   Basic Metabolic Panel: Recent Labs  Lab 12/26/23 0750 12/26/23 0759 12/27/23 0756  NA 135 136 131*  K 4.7 4.8 3.9  CL 97*  --  100  CO2 25  --  23  GLUCOSE 196*  --  221*  BUN 18  --  15  CREATININE 0.81  --  0.85  CALCIUM 9.4  --  8.6*   GFR: Estimated Creatinine Clearance: 96.9 mL/min (by C-G formula based on SCr of 0.85 mg/dL). Liver Function Tests: Recent Labs  Lab 12/26/23 0750 12/27/23 0756  AST 17 11*  ALT 14 10  ALKPHOS 78 69  BILITOT 0.7 1.0  PROT 7.5 6.2*  ALBUMIN 3.6 2.8*   No results for input(s): "LIPASE", "AMYLASE" in the last 168 hours. No results for input(s): "AMMONIA" in the last 168 hours. Coagulation Profile: Recent Labs  Lab 12/26/23 1453  INR 1.1   Cardiac Enzymes: No results for input(s): "CKTOTAL", "CKMB", "CKMBINDEX", "TROPONINI" in the last 168 hours. BNP (last 3 results) No results for input(s): "PROBNP" in the last 8760 hours. HbA1C: No results for input(s): "HGBA1C" in the last 72 hours. CBG: Recent Labs  Lab 12/26/23 1701 12/26/23 2020 12/27/23 0436 12/27/23 0835 12/27/23 1150  GLUCAP 145* 140* 194* 221* 265*   Lipid Profile: No results for input(s): "CHOL", "HDL", "LDLCALC", "TRIG", "CHOLHDL", "LDLDIRECT" in the last 72 hours. Thyroid Function Tests: No results for input(s): "TSH", "T4TOTAL",  "FREET4", "T3FREE", "THYROIDAB" in the last 72 hours. Anemia Panel: No results for input(s): "VITAMINB12", "FOLATE", "FERRITIN", "TIBC", "IRON", "RETICCTPCT" in the last 72 hours. Sepsis Labs: Recent Labs  Lab 12/26/23 0759 12/26/23 1220  LATICACIDVEN 2.4* 1.1    Recent Results (from the past 240 hours)  Culture, blood (routine x 2)     Status: None (Preliminary result)   Collection Time: 12/26/23  7:04 AM   Specimen: BLOOD LEFT FOREARM  Result Value Ref Range Status   Specimen Description BLOOD LEFT FOREARM  Final   Special  Requests   Final    BOTTLES DRAWN AEROBIC AND ANAEROBIC Blood Culture results may not be optimal due to an inadequate volume of blood received in culture bottles   Culture   Final    NO GROWTH < 24 HOURS Performed at [facility], [address], [place], [place] [address]    Report Status PENDING  Incomplete  Resp panel by RT-PCR (RSV, Flu A&B, Covid) Anterior Nasal Swab     Status: None   Collection Time: 12/26/23  7:05 AM   Specimen: Anterior Nasal Swab  Result Value Ref Range Status   SARS Coronavirus 2 by RT PCR NEGATIVE NEGATIVE Final   Influenza A by PCR NEGATIVE NEGATIVE Final   Influenza B by PCR NEGATIVE NEGATIVE Final    Comment: (NOTE) The Xpert Xpress SARS-CoV-2/FLU/RSV plus assay is intended as an aid in the diagnosis of influenza from Nasopharyngeal swab specimens and should not be used as a sole basis for treatment. Nasal washings and aspirates are unacceptable for Xpert Xpress SARS-CoV-2/FLU/RSV testing.  Fact Sheet for Patients: BloggerCourse.com  Fact Sheet for Healthcare Providers: SeriousBroker.it  This test is not yet approved or cleared by the [place] FDA and has been authorized for detection and/or diagnosis of SARS-CoV-2 by FDA under an Emergency Use Authorization (EUA). This EUA will remain in effect (meaning this test can be used) for the duration of  the COVID-19 declaration under Section 564(b)(1) of the Act, 21 U.S.C. section 360bbb-3(b)(1), unless the authorization is terminated or revoked.     Resp Syncytial Virus by PCR NEGATIVE NEGATIVE Final    Comment: (NOTE) Fact Sheet for Patients: BloggerCourse.com  Fact Sheet for Healthcare Providers: SeriousBroker.it  This test is not yet approved or cleared by the [place] FDA and has been authorized for detection and/or diagnosis of SARS-CoV-2 by FDA under an Emergency Use Authorization (EUA). This EUA will remain in effect (meaning this test can be used) for the duration of the COVID-19 declaration under Section 564(b)(1) of the Act, 21 U.S.C. section 360bbb-3(b)(1), unless the authorization is terminated or revoked.  Performed at [facility], [address], [place], [place] [address]   Culture, blood (routine x 2)     Status: None (Preliminary result)   Collection Time: 12/26/23  7:09 AM   Specimen: BLOOD RIGHT HAND  Result Value Ref Range Status   Specimen Description BLOOD RIGHT HAND  Final   Special Requests   Final    AEROBIC BOTTLE ONLY Blood Culture results may not be optimal due to an inadequate volume of blood received in culture bottles   Culture   Final    NO GROWTH < 24 HOURS Performed at [facility], [address] [place], [place] [address]    Report Status PENDING  Incomplete  Urine Culture     Status: Abnormal (Preliminary result)   Collection Time: 12/26/23 11:36 AM   Specimen: Urine, Random  Result Value Ref Range Status   Specimen Description URINE, RANDOM  Final   Special Requests NONE Reflexed from [id number]  Final   Culture (A)  Final    >=100,000 COLONIES/mL STAPHYLOCOCCUS AUREUS SUSCEPTIBILITIES TO FOLLOW Performed at [facility], [address] [place], [place] [address]    Report Status PENDING  Incomplete         Radiology Studies: DG Chest Port 1  View Result Date: 12/26/2023 CLINICAL DATA:  Fever EXAM: PORTABLE CHEST 1 VIEW COMPARISON:  11/04/2023 FINDINGS: Chronic interstitial  coarsening at the lung bases with overall scar-like appearance that is stable. Some fibrosis seen at the right lung base on recent abdominal CT. There is no edema, consolidation, effusion, or pneumothorax. Normal heart size and mediastinal contours. IMPRESSION: Pulmonary scarring without acute superimposed finding. Electronically Signed   By: Tiburcio Pea M.D.   On: 12/26/2023 07:29           LOS: 0 days   Time spent= 35 mins    Miguel Rota, MD [facility]  If 7PM-7AM, please contact night-coverage  12/27/2023, 11:55 AM

## 2023-12-27 NOTE — Hospital Course (Addendum)
 Brief Narrative:    73 y.o. male with medical history significant of hyperlipidemia, diabetes, CAD status post PCI April 2024, PVD, anemia, diastolic CHF, left greater potation, DVT presenting with catheter dysfunction.  He was in the ER on 3/17 for constipation and urinary retention.  He was disimpacted and Foley catheter was placed.  Upon admission Foley is not draining well and was found to have sepsis secondary to urinary tract infection started on IV fluids and Rocephin.  Assessment & Plan:  Principal Problem:   Sepsis secondary to UTI [facility]) Active Problems:   CAD (coronary artery disease)   Type 2 diabetes mellitus with hyperlipidemia (HCC)   Normocytic anemia   PVD (peripheral vascular disease) (HCC)   Hyperlipidemia   History of DVT (deep vein thrombosis)   Hx of left BKA (HCC)   Chronic diastolic CHF (congestive heart failure) (HCC)   Sepsis secondary to catheter associated UTI History of BPH - Sepsis physiology is improving.  Follow culture data.  Currently on empiric IV Rocephin, IV fluids as necessary.  Foley catheter changed in ER - Continue Flomax  Hyperlipidemia - Statin  Insulin-dependent diabetes mellitus type 2 - On insulin, sliding scale and Accu-Chek.  Adjust as necessary  CAD status post PCI 2024 Peripheral vascular disease - On aspirin and statin  Orthostatic hypotension - On midodrine and fludrocortisone  Chronic diastolic CHF with preserved EF 60% - Not on diuretics  History of DVT status post left BKA - Supportive care.   DVT prophylaxis: enoxaparin (LOVENOX) injection 40 mg Start: 12/26/23 1400      Code Status: Full Code Family Communication:   Continue hospital stay for IV antibiotics    Subjective: Seen and examined at bedside.  Still feels weak but better compared to yesterday.   Examination:  General exam: Appears calm and comfortable  Respiratory system: Clear to auscultation. Respiratory effort normal. Cardiovascular system:  S1 & S2 heard, RRR. No JVD, murmurs, rubs, gallops or clicks. No pedal edema. Gastrointestinal system: Abdomen is nondistended, soft and nontender. No organomegaly or masses felt. Normal bowel sounds heard. Central nervous system: Alert and oriented. No focal neurological deficits. Extremities: Status post left BKA Skin: No rashes, lesions or ulcers Psychiatry: Judgement and insight appear normal. Mood & affect appropriate. Foley in place, changed in ER

## 2023-12-28 DIAGNOSIS — T83511A Infection and inflammatory reaction due to indwelling urethral catheter, initial encounter: Secondary | ICD-10-CM | POA: Diagnosis not present

## 2023-12-28 DIAGNOSIS — N390 Urinary tract infection, site not specified: Secondary | ICD-10-CM | POA: Diagnosis not present

## 2023-12-28 DIAGNOSIS — Z22322 Carrier or suspected carrier of Methicillin resistant Staphylococcus aureus: Secondary | ICD-10-CM | POA: Diagnosis not present

## 2023-12-28 DIAGNOSIS — A419 Sepsis, unspecified organism: Secondary | ICD-10-CM | POA: Diagnosis not present

## 2023-12-28 LAB — BASIC METABOLIC PANEL WITH GFR
Anion gap: 13 (ref 5–15)
BUN: 16 mg/dL (ref 8–23)
CO2: 23 mmol/L (ref 22–32)
Calcium: 9 mg/dL (ref 8.9–10.3)
Chloride: 98 mmol/L (ref 98–111)
Creatinine, Ser: 0.76 mg/dL (ref 0.61–1.24)
GFR, Estimated: >60 mL/min (ref >60)
Glucose, Bld: 282 mg/dL — ABNORMAL HIGH (ref 70–99)
Potassium: 4.2 mmol/L (ref 3.5–5.1)
Sodium: 134 mmol/L — ABNORMAL LOW (ref 135–145)

## 2023-12-28 LAB — URINE CULTURE: Culture: >=100000 — AB

## 2023-12-28 LAB — CBC
HCT: 32.4 % — ABNORMAL LOW (ref 39.0–52.0)
Hemoglobin: 11 g/dL — ABNORMAL LOW (ref 13.0–17.0)
MCH: 30.9 pg (ref 26.0–34.0)
MCHC: 34 g/dL (ref 30.0–36.0)
MCV: 91 fL (ref 80.0–100.0)
Platelets: 251 10*3/uL (ref 150–400)
RBC: 3.56 MIL/uL — ABNORMAL LOW (ref 4.22–5.81)
RDW: 13.9 % (ref 11.5–15.5)
WBC: 5.4 10*3/uL (ref 4.0–10.5)
nRBC: 0 % (ref 0.0–0.2)

## 2023-12-28 LAB — GLUCOSE, CAPILLARY
Glucose-Capillary: 252 mg/dL — ABNORMAL HIGH (ref 70–99)
Glucose-Capillary: 199 mg/dL — ABNORMAL HIGH (ref 70–99)
Glucose-Capillary: 223 mg/dL — ABNORMAL HIGH (ref 70–99)
Glucose-Capillary: 179 mg/dL — ABNORMAL HIGH (ref 70–99)
Glucose-Capillary: 199 mg/dL — ABNORMAL HIGH (ref 70–99)

## 2023-12-28 LAB — MAGNESIUM: Magnesium: 2.2 mg/dL (ref 1.7–2.4)

## 2023-12-28 MED ORDER — VANCOMYCIN HCL 1250 MG/250ML IV SOLN
1250.0000 mg | Freq: Two times a day (BID) | INTRAVENOUS | Status: DC
  Administered 2023-12-29 – 2023-12-31 (×5): 1250 mg via INTRAVENOUS
  Filled 2023-12-28 (×6): qty 250

## 2023-12-28 MED ORDER — INSULIN GLARGINE-YFGN 100 UNIT/ML ~~LOC~~ SOLN
15.0000 [IU] | Freq: Every day | SUBCUTANEOUS | Status: DC
Start: 2023-12-28 — End: 2023-12-29
  Administered 2023-12-28: 15 [IU] via SUBCUTANEOUS
  Filled 2023-12-28 (×2): qty 0.15

## 2023-12-28 MED ORDER — VANCOMYCIN HCL IN DEXTROSE 1-5 GM/200ML-% IV SOLN
1000.0000 mg | Freq: Two times a day (BID) | INTRAVENOUS | Status: DC
Start: 2023-12-28 — End: 2023-12-28

## 2023-12-28 MED ORDER — HYDROXYZINE HCL 25 MG PO TABS
25.0000 mg | ORAL_TABLET | Freq: Three times a day (TID) | ORAL | Status: DC | PRN
  Administered 2023-12-28 – 2023-12-31 (×4): 25 mg via ORAL
  Filled 2023-12-28 (×5): qty 1

## 2023-12-28 MED ORDER — VANCOMYCIN HCL 1750 MG/350ML IV SOLN
1750.0000 mg | Freq: Once | INTRAVENOUS | Status: AC
  Administered 2023-12-28: 1750 mg via INTRAVENOUS
  Filled 2023-12-28: qty 350

## 2023-12-28 NOTE — Progress Notes (Signed)
  Progress Note   Patient: Micheal Moore. NFA:[id number] DOB: 1951-06-13 DOA: 12/26/2023     1 DOS: the patient was seen and examined on 12/28/2023   Brief hospital course: 73 y.o. male with medical history significant of hyperlipidemia, diabetes, CAD status post PCI April 2024, PVD, anemia, diastolic CHF, left greater potation, DVT presenting with catheter dysfunction.  He was in the ER on 3/17 for constipation and urinary retention.  He was disimpacted and Foley catheter was placed.  Upon admission Foley is not draining well and was found to have sepsis secondary to urinary tract infection started on IV fluids and Rocephin.   Assessment and Plan:  Sepsis - Lactic acidosis, leukocytosis, tachycardia, UTI on presentation given concern for sepsis.  Received empiric antibiotics, IV fluid bolus, blood cultures.  Monitor blood pressure closely.  Blood cultures show NGTD.  Catheter associated urinary tract infection with MRSA - Catheter exchanged in the emergency department.  Preliminary urine cultures noting MRSA.  Was on empiric ceftriaxone.  Will transition patient to vancomycin.  Acute urinary retention secondary to BPH - Foley catheter placed at previous admission.  Exchanged in the emergency department this presentation.  Will leave catheter in place for now.  Flomax on board.  Will need to follow-up with urology in the outpatient setting.  Insulin independent diabetes mellitus - Continues on insulin glargine 10 units daily with insulin sliding scale.  Chronic HFpEF - Does not appear to be in acute exacerbation.  Resume home cardiac regimen.  Chronic orthostatic hypotension - Continues on midodrine, fludrocortisone.  CAD/PVD - Continue aspirin, statin.  History of DVT status post left BKA - Noted        Subjective: Patient resting comfortably this morning.  States he feels improved.  Denies any fever, chills, chest pain, nausea, vomiting, abdominal pain.  Physical  Exam: Vitals:   12/27/23 1937 12/28/23 0033 12/28/23 0451 12/28/23 0820  BP: 108/61 123/73 127/72 (!) 149/83  Pulse: 98 88 89 94  Resp: 18 18 17    Temp: 98.7 F (37.1 C) 98.5 F (36.9 C) 98.2 F (36.8 C) 97.9 F (36.6 C)  TempSrc: Oral Oral Oral Oral  SpO2: 96% 97% 98% 98%  Weight:       GENERAL:  Alert, pleasant, no acute distress, disheveled HEENT:  EOMI CARDIOVASCULAR:  RRR, no murmurs appreciated RESPIRATORY:  Clear to auscultation, no wheezing, rales, or rhonchi GASTROINTESTINAL:  Soft, nontender, nondistended EXTREMITIES: Left BKA NEURO:  No new focal deficits appreciated SKIN:  No rashes noted PSYCH:  Appropriate mood and affect   Data Reviewed:  There are no new results to review at this time.  Family Communication: None at bedside  Disposition: Status is: Inpatient Remains inpatient appropriate because: MRSA UTI  Planned Discharge Destination: Home with Home Health    Time spent: 36 minutes  Author: Jodeane Mulligan, DO 12/28/2023 12:03 PM  For on call review [email or web address].

## 2023-12-28 NOTE — Progress Notes (Signed)
 Pharmacy Antibiotic Note  Micheal Moore. is a 73 y.o. male admitted on 12/26/2023 with MRSA UTI.  Per MD, suspect that MRSA was growing on the urinary catheter, which has now been exchanged. Blood cultures remain no growth to date. Patient started on ceftriaxone, but now being transitioned to vancomycin. Pharmacy has been consulted for vancomycin dosing.  4/12: WBC decreased to 5.4, afebrile, Scr stable at 0.76  Plan: Vancomycin 1750 mg IV x1, then 1250 mg IV every 12 hours (eAUC 436, TBW, Scr 0.8) Monitor renal function and vancomycin levels as appropriate  Weight: 88.5 kg (195 lb)  Temp (24hrs), Avg:98.3 F (36.8 C), Min:97.9 F (36.6 C), Max:98.7 F (37.1 C)  Recent Labs  Lab 12/26/23 0750 12/26/23 0759 12/26/23 1220 12/27/23 0756 12/28/23 0931  WBC 14.5*  --   --  8.8 5.4  CREATININE 0.81  --   --  0.85 0.76  LATICACIDVEN  --  2.4* 1.1  --   --     Estimated Creatinine Clearance: 102.9 mL/min (by C-G formula based on SCr of 0.76 mg/dL).    No Known Allergies  Antimicrobials this admission: Ceftriaxone 4/10 >> 4/12 Vancomycin 4/12 >>  Microbiology results: 4/10 Bcx: negative x2 days 4/10 Urine Cx: MRSA >100k cfus MRSA nares (+)  Thank you for allowing pharmacy to be a part of this patient's care.  Volney Grumbles, PharmD PGY-1 Acute Care Pharmacy Resident 12/28/2023 12:46 PM

## 2023-12-28 NOTE — Plan of Care (Signed)
  Problem: Clinical Measurements: Goal: Diagnostic test results will improve Outcome: Progressing Goal: Signs and symptoms of infection will decrease Outcome: Progressing   Problem: Education: Goal: Ability to describe self-care measures that may prevent or decrease complications (Diabetes Survival Skills Education) will improve Outcome: Progressing   Problem: Coping: Goal: Ability to adjust to condition or change in health will improve Outcome: Progressing   Problem: Health Behavior/Discharge Planning: Goal: Ability to identify and utilize available resources and services will improve Outcome: Progressing Goal: Ability to manage health-related needs will improve Outcome: Progressing   Problem: Metabolic: Goal: Ability to maintain appropriate glucose levels will improve Outcome: Progressing   Problem: Nutritional: Goal: Maintenance of adequate nutrition will improve Outcome: Progressing   Problem: Skin Integrity: Goal: Risk for impaired skin integrity will decrease Outcome: Progressing

## 2023-12-29 DIAGNOSIS — Z89512 Acquired absence of left leg below knee: Secondary | ICD-10-CM | POA: Diagnosis not present

## 2023-12-29 DIAGNOSIS — A419 Sepsis, unspecified organism: Secondary | ICD-10-CM | POA: Diagnosis not present

## 2023-12-29 DIAGNOSIS — N390 Urinary tract infection, site not specified: Secondary | ICD-10-CM | POA: Diagnosis not present

## 2023-12-29 DIAGNOSIS — E1169 Type 2 diabetes mellitus with other specified complication: Secondary | ICD-10-CM | POA: Diagnosis not present

## 2023-12-29 LAB — BASIC METABOLIC PANEL WITH GFR
Anion gap: 10 (ref 5–15)
BUN: 15 mg/dL (ref 8–23)
CO2: 24 mmol/L (ref 22–32)
Calcium: 9 mg/dL (ref 8.9–10.3)
Chloride: 101 mmol/L (ref 98–111)
Creatinine, Ser: 0.69 mg/dL (ref 0.61–1.24)
GFR, Estimated: >60 mL/min (ref >60)
Glucose, Bld: 251 mg/dL — ABNORMAL HIGH (ref 70–99)
Potassium: 4.3 mmol/L (ref 3.5–5.1)
Sodium: 135 mmol/L (ref 135–145)

## 2023-12-29 LAB — CBC
HCT: 34.1 % — ABNORMAL LOW (ref 39.0–52.0)
Hemoglobin: 11.3 g/dL — ABNORMAL LOW (ref 13.0–17.0)
MCH: 30.3 pg (ref 26.0–34.0)
MCHC: 33.1 g/dL (ref 30.0–36.0)
MCV: 91.4 fL (ref 80.0–100.0)
Platelets: 275 K/uL (ref 150–400)
RBC: 3.73 MIL/uL — ABNORMAL LOW (ref 4.22–5.81)
RDW: 13.9 % (ref 11.5–15.5)
WBC: 5.4 K/uL (ref 4.0–10.5)
nRBC: 0 % (ref 0.0–0.2)

## 2023-12-29 LAB — MAGNESIUM: Magnesium: 2.1 mg/dL (ref 1.7–2.4)

## 2023-12-29 LAB — GLUCOSE, CAPILLARY
Glucose-Capillary: 217 mg/dL — ABNORMAL HIGH (ref 70–99)
Glucose-Capillary: 234 mg/dL — ABNORMAL HIGH (ref 70–99)
Glucose-Capillary: 181 mg/dL — ABNORMAL HIGH (ref 70–99)
Glucose-Capillary: 236 mg/dL — ABNORMAL HIGH (ref 70–99)

## 2023-12-29 MED ORDER — INSULIN GLARGINE-YFGN 100 UNIT/ML ~~LOC~~ SOLN
20.0000 [IU] | Freq: Every day | SUBCUTANEOUS | Status: DC
  Administered 2023-12-29 – 2023-12-30 (×2): 20 [IU] via SUBCUTANEOUS
  Filled 2023-12-29 (×3): qty 0.2

## 2023-12-29 NOTE — Plan of Care (Signed)
  Problem: Fluid Volume: Goal: Hemodynamic stability will improve Outcome: Progressing   Problem: Clinical Measurements: Goal: Diagnostic test results will improve Outcome: Progressing Goal: Signs and symptoms of infection will decrease Outcome: Progressing   Problem: Education: Goal: Ability to describe self-care measures that may prevent or decrease complications (Diabetes Survival Skills Education) will improve Outcome: Progressing Goal: Individualized Educational Video(s) Outcome: Progressing   Problem: Coping: Goal: Ability to adjust to condition or change in health will improve Outcome: Progressing   Problem: Health Behavior/Discharge Planning: Goal: Ability to identify and utilize available resources and services will improve Outcome: Progressing Goal: Ability to manage health-related needs will improve Outcome: Progressing   Problem: Metabolic: Goal: Ability to maintain appropriate glucose levels will improve Outcome: Progressing   Problem: Nutritional: Goal: Maintenance of adequate nutrition will improve Outcome: Progressing Goal: Progress toward achieving an optimal weight will improve Outcome: Progressing   Problem: Skin Integrity: Goal: Risk for impaired skin integrity will decrease Outcome: Progressing   Problem: Education: Goal: Knowledge of General Education information will improve Description: Including pain rating scale, medication(s)/side effects and non-pharmacologic comfort measures Outcome: Progressing   Problem: Health Behavior/Discharge Planning: Goal: Ability to manage health-related needs will improve Outcome: Progressing   Problem: Clinical Measurements: Goal: Ability to maintain clinical measurements within normal limits will improve Outcome: Progressing Goal: Will remain free from infection Outcome: Progressing Goal: Diagnostic test results will improve Outcome: Progressing Goal: Respiratory complications will improve Outcome:  Progressing Goal: Cardiovascular complication will be avoided Outcome: Progressing   Problem: Nutrition: Goal: Adequate nutrition will be maintained Outcome: Progressing   Problem: Coping: Goal: Level of anxiety will decrease Outcome: Progressing   Problem: Elimination: Goal: Will not experience complications related to bowel motility Outcome: Progressing Goal: Will not experience complications related to urinary retention Outcome: Progressing   Problem: Pain Managment: Goal: General experience of comfort will improve and/or be controlled Outcome: Progressing   Problem: Safety: Goal: Ability to remain free from injury will improve Outcome: Progressing   Problem: Skin Integrity: Goal: Risk for impaired skin integrity will decrease Outcome: Progressing

## 2023-12-29 NOTE — Progress Notes (Signed)
  Progress Note   Patient: Micheal Moore. ZOX:[id number] DOB: [birth year] DOA: 12/26/2023     2 DOS: the patient was seen and examined on 12/29/2023   Brief hospital course: 73 y.o. male with medical history significant of hyperlipidemia, diabetes, CAD status post PCI April 2024, PVD, anemia, diastolic CHF, left greater potation, DVT presenting with catheter dysfunction.  He was in the ER on 3/17 for constipation and urinary retention.  He was disimpacted and Foley catheter was placed.  Upon admission Foley is not draining well and was found to have sepsis secondary to urinary tract infection started on IV fluids and Rocephin.   Assessment and Plan:  Sepsis - Lactic acidosis, leukocytosis, tachycardia, UTI on presentation given concern for sepsis.  Received empiric antibiotics, IV fluid bolus, blood cultures.  Monitor blood pressure closely.  Blood cultures show NGTD.  Appears to be resolving.  Catheter associated urinary tract infection with MRSA - Catheter exchanged in the emergency department.  Preliminary urine cultures noting MRSA.  Was on empiric ceftriaxone.  Will transition patient to vancomycin 4/12.  Appears to be resolving after catheter exchange.  No leukocytosis nor fever.  Acute urinary retention secondary to BPH - Foley catheter placed at previous admission.  Exchanged in the emergency department this presentation.  Will leave catheter in place for now.  Flomax on board.  Will need to follow-up with urology in the outpatient setting.  Insulin independent diabetes mellitus - Not well-controlled.  Will increase insulin glargine from 15-20 units at bedtime.  Continue insulin sliding scale.  Chronic HFpEF - Does not appear to be in acute exacerbation.  Resume home cardiac regimen.  Chronic orthostatic hypotension - Continues on midodrine, fludrocortisone.  CAD/PVD - Continue aspirin, statin.  History of DVT status post left BKA - Noted  Goals of care - Patient  motivated to be discharged home, does not want to pursue SNF for rehab.  Likely discharge 1-2 days.      Subjective: Patient resting comfortably this morning.  States he feels improved.  Denies any fever, chills, chest pain, nausea, vomiting, abdominal pain.  Physical Exam: Vitals:   12/28/23 2047 12/28/23 2049 12/29/23 0440 12/29/23 0926  BP: 138/73 138/73 128/70 127/79  Pulse: 96 94 80 95  Resp:  18 16 18   Temp:  98.5 F (36.9 C) (!) 97.5 F (36.4 C) 98.4 F (36.9 C)  TempSrc:  Oral Oral Oral  SpO2: 98% 98% 97% 97%  Weight:       GENERAL:  Alert, pleasant, no acute distress, disheveled HEENT:  EOMI CARDIOVASCULAR:  RRR, no murmurs appreciated RESPIRATORY:  Clear to auscultation, no wheezing, rales, or rhonchi GASTROINTESTINAL:  Soft, nontender, nondistended EXTREMITIES: Left BKA NEURO:  No new focal deficits appreciated SKIN:  No rashes noted PSYCH:  Appropriate mood and affect   Data Reviewed:  There are no new results to review at this time.  Family Communication: None at bedside  Disposition: Status is: Inpatient Remains inpatient appropriate because: MRSA UTI  Planned Discharge Destination: Home with Home Health    Time spent: 35 minutes  Author: Jodeane Mulligan, DO 12/29/2023 12:05 PM  For on call review [email or web address].

## 2023-12-30 DIAGNOSIS — A419 Sepsis, unspecified organism: Secondary | ICD-10-CM | POA: Diagnosis not present

## 2023-12-30 DIAGNOSIS — N390 Urinary tract infection, site not specified: Secondary | ICD-10-CM | POA: Diagnosis not present

## 2023-12-30 LAB — GLUCOSE, CAPILLARY
Glucose-Capillary: 185 mg/dL — ABNORMAL HIGH (ref 70–99)
Glucose-Capillary: 199 mg/dL — ABNORMAL HIGH (ref 70–99)
Glucose-Capillary: 240 mg/dL — ABNORMAL HIGH (ref 70–99)
Glucose-Capillary: 247 mg/dL — ABNORMAL HIGH (ref 70–99)

## 2023-12-30 LAB — CBC
HCT: 33.6 % — ABNORMAL LOW (ref 39.0–52.0)
Hemoglobin: 10.9 g/dL — ABNORMAL LOW (ref 13.0–17.0)
MCH: 29.8 pg (ref 26.0–34.0)
MCHC: 32.4 g/dL (ref 30.0–36.0)
MCV: 91.8 fL (ref 80.0–100.0)
Platelets: 279 10*3/uL (ref 150–400)
RBC: 3.66 MIL/uL — ABNORMAL LOW (ref 4.22–5.81)
RDW: 13.7 % (ref 11.5–15.5)
WBC: 4.9 10*3/uL (ref 4.0–10.5)
nRBC: 0 % (ref 0.0–0.2)

## 2023-12-30 LAB — BASIC METABOLIC PANEL WITH GFR
Anion gap: 7 (ref 5–15)
BUN: 14 mg/dL (ref 8–23)
CO2: 25 mmol/L (ref 22–32)
Calcium: 9 mg/dL (ref 8.9–10.3)
Chloride: 103 mmol/L (ref 98–111)
Creatinine, Ser: 0.69 mg/dL (ref 0.61–1.24)
GFR, Estimated: >60 mL/min (ref >60)
Glucose, Bld: 200 mg/dL — ABNORMAL HIGH (ref 70–99)
Potassium: 4.3 mmol/L (ref 3.5–5.1)
Sodium: 135 mmol/L (ref 135–145)

## 2023-12-30 LAB — MAGNESIUM: Magnesium: 2.2 mg/dL (ref 1.7–2.4)

## 2023-12-30 MED ORDER — GERHARDT'S BUTT CREAM
TOPICAL_CREAM | Freq: Three times a day (TID) | CUTANEOUS | Status: DC
  Filled 2023-12-30: qty 60

## 2023-12-30 MED ORDER — FLEET ENEMA RE ENEM
1.0000 | ENEMA | Freq: Once | RECTAL | Status: AC
  Administered 2023-12-30: 1 via RECTAL
  Filled 2023-12-30: qty 1

## 2023-12-30 MED ORDER — SENNA 8.6 MG PO TABS
1.0000 | ORAL_TABLET | Freq: Every day | ORAL | Status: DC
  Administered 2023-12-30: 8.6 mg via ORAL
  Filled 2023-12-30: qty 1

## 2023-12-30 NOTE — Plan of Care (Signed)
  Problem: Fluid Volume: Goal: Hemodynamic stability will improve Outcome: Progressing   Problem: Clinical Measurements: Goal: Diagnostic test results will improve Outcome: Progressing Goal: Signs and symptoms of infection will decrease Outcome: Progressing   Problem: Education: Goal: Ability to describe self-care measures that may prevent or decrease complications (Diabetes Survival Skills Education) will improve Outcome: Progressing Goal: Individualized Educational Video(s) Outcome: Progressing   Problem: Coping: Goal: Ability to adjust to condition or change in health will improve Outcome: Progressing   Problem: Health Behavior/Discharge Planning: Goal: Ability to identify and utilize available resources and services will improve Outcome: Progressing Goal: Ability to manage health-related needs will improve Outcome: Progressing   Problem: Metabolic: Goal: Ability to maintain appropriate glucose levels will improve Outcome: Progressing   Problem: Nutritional: Goal: Maintenance of adequate nutrition will improve Outcome: Progressing Goal: Progress toward achieving an optimal weight will improve Outcome: Progressing   Problem: Skin Integrity: Goal: Risk for impaired skin integrity will decrease Outcome: Progressing   Problem: Tissue Perfusion: Goal: Adequacy of tissue perfusion will improve Outcome: Progressing   Problem: Education: Goal: Knowledge of General Education information will improve Description: Including pain rating scale, medication(s)/side effects and non-pharmacologic comfort measures Outcome: Progressing   Problem: Health Behavior/Discharge Planning: Goal: Ability to manage health-related needs will improve Outcome: Progressing   Problem: Clinical Measurements: Goal: Ability to maintain clinical measurements within normal limits will improve Outcome: Progressing Goal: Will remain free from infection Outcome: Progressing Goal: Diagnostic test  results will improve Outcome: Progressing Goal: Respiratory complications will improve Outcome: Progressing Goal: Cardiovascular complication will be avoided Outcome: Progressing   Problem: Activity: Goal: Risk for activity intolerance will decrease Outcome: Progressing   Problem: Nutrition: Goal: Adequate nutrition will be maintained Outcome: Progressing   Problem: Coping: Goal: Level of anxiety will decrease Outcome: Progressing   Problem: Elimination: Goal: Will not experience complications related to bowel motility Outcome: Progressing Goal: Will not experience complications related to urinary retention Outcome: Progressing   Problem: Pain Managment: Goal: General experience of comfort will improve and/or be controlled Outcome: Progressing   Problem: Safety: Goal: Ability to remain free from injury will improve Outcome: Progressing   Problem: Skin Integrity: Goal: Risk for impaired skin integrity will decrease Outcome: Progressing

## 2023-12-30 NOTE — Consult Note (Signed)
 WOC Nurse Consult Note: Reason for Consult:skin breakdown  Wound type: Moisture Associated Skin Damage  ICD-10 CM Codes for Irritant Dermatitis  L24A2 - Due to fecal, urinary or dual incontinence  Pressure Injury POA: NA, related to moisture with ? Yeast component  Measurement: widespread across buttocks  Wound RUE:[id number] with scattered partial thickness skin loss  Drainage (amount, consistency, odor)  Periwound: evidence of chronic tissue damage with dark purplish discoloration that is widespread across buttocks, peeling epithelium  Dressing procedure/placement/frequency:  Cleanse buttocks/sacrum/posterior thighs with soap and water, dry and apply Gerhardt's Butt Cream 3 times a day and prn soiling.    POC discussed with bedside nurse. WOC team will not follow. Re-consult if further needs arise.  Thank you,    Ronni Colace MSN, RN-BC, [employer] [phone]

## 2023-12-30 NOTE — Progress Notes (Signed)
 PROGRESS NOTE  Micheal Moore.  ZOX:[id number] DOB: 09-Jul-1951 DOA: 12/26/2023 PCP: Pcp, No  Consultants  Brief Narrative: 73 y.o. male with medical history significant of hyperlipidemia, diabetes, CAD status post PCI April 2024, PVD, anemia, diastolic CHF, left greater potation, DVT presenting with catheter dysfunction.  He was in the ER on 3/17 for constipation and urinary retention.  He was disimpacted and Foley catheter was placed.  Upon admission Foley is not draining well and was found to have sepsis secondary to urinary tract infection started on IV fluids and Rocephin.  Urine culture grew MRSA and he was therefore switched to vancomycin   Assessment and Plan:   Sepsis - Lactic acidosis, leukocytosis, tachycardia, UTI on presentation given concern for sepsis.  Received empiric antibiotics, IV fluid bolus, blood cultures.  Monitor blood pressure closely.  Blood cultures show NGTD.  - Secondary to UTI below. - Resolved   Catheter associated urinary tract infection with MRSA - Catheter exchanged in the emergency department.  Preliminary urine cultures noting MRSA.  Was on empiric ceftriaxone.  Will transition patient to vancomycin 4/12.  Appears to be resolving after catheter exchange.  No leukocytosis nor fever. -Spoke with infectious disease Dr. Algis Liming today by secure chat who recommended Bactrim on discharge for treatment. -Will switch to Bactrim on discharge.  Continue IV vancomycin for now.  Constipation: - Patient found to have fecal impaction in the emergency department and was disimpacted. - Reports no bowel movement since admission to me today.Eather Colas is written for daily.  I will add Senokot plus fleets enema today.   Acute urinary retention secondary to BPH - Foley catheter placed at previous admission.  Exchanged in the emergency department this presentation.  Will leave catheter in place for now.  Flomax on board.  Will need to follow-up with urology in the  outpatient setting.   Insulin independent diabetes mellitus - Not well-controlled.   - Insulin increased yesterday to 20 units.  CBGs remain high 100s - Continue sliding scale insulin.   Chronic HFpEF - Does not appear to be in acute exacerbation.  Resume home cardiac regimen.   Chronic orthostatic hypotension - Continues on midodrine, fludrocortisone.   CAD/PVD - Continue aspirin, statin.   History of DVT status post left BKA - Noted   Goals of care - Patient motivated to be discharged home, does not want to pursue SNF for rehab.  -Working on correcting constipation. - Likely will discharge home tomorrow if he has continued improvement         DVT prophylaxis:  enoxaparin (LOVENOX) injection 40 mg Start: 12/26/23 1400  Code Status:   Code Status: Full Code Level of care: Med-Surg   Subjective: Patient lying in bed.  Reports no bowel movement since admission.  Otherwise eating and drinking well.  No complaints.  Objective: Vitals:   12/29/23 0926 12/29/23 1754 12/29/23 1959 12/30/23 0751  BP: 127/79 (!) 162/94 121/68 (!) 158/94  Pulse: 95 97 96 91  Resp: 18  19 18   Temp: 98.4 F (36.9 C) 98.2 F (36.8 C) 98.3 F (36.8 C) 98 F (36.7 C)  TempSrc: Oral Oral Oral Oral  SpO2: 97% 100% 96% 98%  Weight:        Intake/Output Summary (Last 24 hours) at 12/30/2023 1431 Last data filed at 12/30/2023 1143 Gross per 24 hour  Intake 1289.37 ml  Output 3500 ml  Net -2210.63 ml   Filed Weights   12/26/23 0542  Weight: 88.5 kg  Body mass index is 23.12 kg/m.  Gen: 73 y.o. male in no apparent distress.  Nontoxic Pulm: Non-labored breathing.  Clear to auscultation bilaterally.  CV: Regular rate and rhythm. No murmur, rub, or gallop. No JVD GI: Abdomen soft, non-tender, non-distended, with normoactive bowel sounds. No organomegaly or masses felt. Ext: Warm, no deformities,  Skin: No rashes, lesions  Neuro: Alert and oriented. No focal neurological deficits. Psych:  Calm  Judgement and insight appear normal. Mood & affect appropriate.     I have personally reviewed the following labs and images: CBC: Recent Labs  Lab 12/26/23 0750 12/26/23 0759 12/27/23 0756 12/28/23 0931 12/29/23 1050 12/30/23 0646  WBC 14.5*  --  8.8 5.4 5.4 4.9  NEUTROABS 12.2*  --   --   --   --   --   HGB 13.2 13.9 10.8* 11.0* 11.3* 10.9*  HCT 40.8 41.0 32.5* 32.4* 34.1* 33.6*  MCV 93.4  --  91.3 91.0 91.4 91.8  PLT 318  --  244 251 275 279   BMP &GFR Recent Labs  Lab 12/26/23 0750 12/26/23 0759 12/27/23 0756 12/28/23 0931 12/29/23 1050 12/30/23 0646  NA 135 136 131* 134* 135 135  K 4.7 4.8 3.9 4.2 4.3 4.3  CL 97*  --  100 98 101 103  CO2 25  --  23 23 24 25   GLUCOSE 196*  --  221* 282* 251* 200*  BUN 18  --  15 16 15 14   CREATININE 0.81  --  0.85 0.76 0.69 0.69  CALCIUM 9.4  --  8.6* 9.0 9.0 9.0  MG  --   --   --  2.2 2.1 2.2   Estimated Creatinine Clearance: 102.9 mL/min (by C-G formula based on SCr of 0.69 mg/dL). Liver & Pancreas: Recent Labs  Lab 12/26/23 0750 12/27/23 0756  AST 17 11*  ALT 14 10  ALKPHOS 78 69  BILITOT 0.7 1.0  PROT 7.5 6.2*  ALBUMIN 3.6 2.8*   No results for input(s): "LIPASE", "AMYLASE" in the last 168 hours. No results for input(s): "AMMONIA" in the last 168 hours. Diabetic: No results for input(s): "HGBA1C" in the last 72 hours. Recent Labs  Lab 12/29/23 1138 12/29/23 1642 12/29/23 2002 12/30/23 0749 12/30/23 1158  GLUCAP 234* 181* 236* 185* 199*   Cardiac Enzymes: No results for input(s): "CKTOTAL", "CKMB", "CKMBINDEX", "TROPONINI" in the last 168 hours. No results for input(s): "PROBNP" in the last 8760 hours. Coagulation Profile: Recent Labs  Lab 12/26/23 1453  INR 1.1   Thyroid Function Tests: No results for input(s): "TSH", "T4TOTAL", "FREET4", "T3FREE", "THYROIDAB" in the last 72 hours. Lipid Profile: No results for input(s): "CHOL", "HDL", "LDLCALC", "TRIG", "CHOLHDL", "LDLDIRECT" in the last 72  hours. Anemia Panel: No results for input(s): "VITAMINB12", "FOLATE", "FERRITIN", "TIBC", "IRON", "RETICCTPCT" in the last 72 hours. Urine analysis:    Component Value Date/Time   COLORURINE YELLOW 12/26/2023 1136   APPEARANCEUR TURBID (A) 12/26/2023 1136   APPEARANCEUR Clear 12/04/2021 1430   LABSPEC 1.025 12/26/2023 1136   PHURINE 5.0 12/26/2023 1136   GLUCOSEU NEGATIVE 12/26/2023 1136   HGBUR MODERATE (A) 12/26/2023 1136   BILIRUBINUR NEGATIVE 12/26/2023 1136   BILIRUBINUR Negative 12/04/2021 1430   KETONESUR NEGATIVE 12/26/2023 1136   PROTEINUR 100 (A) 12/26/2023 1136   NITRITE NEGATIVE 12/26/2023 1136   LEUKOCYTESUR LARGE (A) 12/26/2023 1136   Sepsis Labs: Invalid input(s): "PROCALCITONIN", "LACTICIDVEN"  Microbiology: Recent Results (from the past 240 hours)  Culture, blood (routine x 2)  Status: None (Preliminary result)   Collection Time: 12/26/23  7:04 AM   Specimen: BLOOD LEFT FOREARM  Result Value Ref Range Status   Specimen Description BLOOD LEFT FOREARM  Final   Special Requests   Final    BOTTLES DRAWN AEROBIC AND ANAEROBIC Blood Culture results may not be optimal due to an inadequate volume of blood received in culture bottles   Culture   Final    NO GROWTH 4 DAYS Performed at [facility], [address] [place], [place] [address]    Report Status PENDING  Incomplete  Resp panel by RT-PCR (RSV, Flu A&B, Covid) Anterior Nasal Swab     Status: None   Collection Time: 12/26/23  7:05 AM   Specimen: Anterior Nasal Swab  Result Value Ref Range Status   SARS Coronavirus 2 by RT PCR NEGATIVE NEGATIVE Final   Influenza A by PCR NEGATIVE NEGATIVE Final   Influenza B by PCR NEGATIVE NEGATIVE Final    Comment: (NOTE) The Xpert Xpress SARS-CoV-2/FLU/RSV plus assay is intended as an aid in the diagnosis of influenza from Nasopharyngeal swab specimens and should not be used as a sole basis for treatment. Nasal washings and aspirates are unacceptable for  Xpert Xpress SARS-CoV-2/FLU/RSV testing.  Fact Sheet for Patients: BloggerCourse.com  Fact Sheet for Healthcare Providers: SeriousBroker.it  This test is not yet approved or cleared by the [place]  FDA and has been authorized for detection and/or diagnosis of SARS-CoV-2 by FDA under an Emergency Use Authorization (EUA). This EUA will remain in effect (meaning this test can be used) for the duration of the COVID-19 declaration under Section 564(b)(1) of the Act, 21 U.S.C. section 360bbb-3(b)(1), unless the authorization is terminated or revoked.     Resp Syncytial Virus by PCR NEGATIVE NEGATIVE Final    Comment: (NOTE) Fact Sheet for Patients: BloggerCourse.com  Fact Sheet for Healthcare Providers: SeriousBroker.it  This test is not yet approved or cleared by the [place]  FDA and has been authorized for detection and/or diagnosis of SARS-CoV-2 by FDA under an Emergency Use Authorization (EUA). This EUA will remain in effect (meaning this test can be used) for the duration of the COVID-19 declaration under Section 564(b)(1) of the Act, 21 U.S.C. section 360bbb-3(b)(1), unless the authorization is terminated or revoked.  Performed at [facility], [address] [place], [place] [address]   Culture, blood (routine x 2)     Status: None (Preliminary result)   Collection Time: 12/26/23  7:09 AM   Specimen: BLOOD RIGHT HAND  Result Value Ref Range Status   Specimen Description BLOOD RIGHT HAND  Final   Special Requests   Final    AEROBIC BOTTLE ONLY Blood Culture results may not be optimal due to an inadequate volume of blood received in culture bottles   Culture   Final    NO GROWTH 4 DAYS Performed at [facility], [address], [place], [place] [address]    Report Status PENDING  Incomplete  Urine Culture     Status: Abnormal   Collection Time:  12/26/23 11:36 AM   Specimen: Urine, Random  Result Value Ref Range Status   Specimen Description URINE, RANDOM  Final   Special Requests   Final    NONE Reflexed from [id number] Performed at [facility], [address], [place], [place] [address]    Culture (A)  Final    >=100,000 COLONIES/mL METHICILLIN RESISTANT STAPHYLOCOCCUS AUREUS   Report Status 12/28/2023  FINAL  Final   Organism ID, Bacteria METHICILLIN RESISTANT STAPHYLOCOCCUS AUREUS (A)  Final      Susceptibility   Methicillin resistant staphylococcus aureus - MIC*    CIPROFLOXACIN 4 RESISTANT Resistant     GENTAMICIN <=0.5 SENSITIVE Sensitive     NITROFURANTOIN <=16 SENSITIVE Sensitive     OXACILLIN >=4 RESISTANT Resistant     TETRACYCLINE >=16 RESISTANT Resistant     VANCOMYCIN 1 SENSITIVE Sensitive     TRIMETH/SULFA >=320 RESISTANT Resistant     RIFAMPIN <=0.5 SENSITIVE Sensitive     Inducible Clindamycin POSITIVE Resistant     LINEZOLID 2 SENSITIVE Sensitive     * >=100,000 COLONIES/mL METHICILLIN RESISTANT STAPHYLOCOCCUS AUREUS  MRSA Next Gen by PCR, Nasal     Status: Abnormal   Collection Time: 12/27/23 12:12 PM   Specimen: Nasal Mucosa; Nasal Swab  Result Value Ref Range Status   MRSA by PCR Next Gen DETECTED (A) NOT DETECTED Final    Comment: RESULT CALLED TO, READ BACK BY AND VERIFIED WITH: RN Jannetta Men on [phone] @1700  by SM (NOTE) The GeneXpert MRSA Assay (FDA approved for NASAL specimens only), is one component of a comprehensive MRSA colonization surveillance program. It is not intended to diagnose MRSA infection nor to guide or monitor treatment for MRSA infections. Test performance is not FDA approved in patients less than 12 years old. Performed at [facility], [address], [place], [place] [address]     Radiology Studies: No results found.  Scheduled Meds:  aspirin EC  81 mg Oral Daily   [START ON 12/31/2023] atorvastatin  10 mg Oral Once per day on Monday Thursday   Chlorhexidine  Gluconate Cloth  6 each Topical Daily   Chlorhexidine Gluconate Cloth  6 each Topical Daily   docusate sodium  100 mg Oral BID   enoxaparin (LOVENOX) injection  40 mg Subcutaneous Q24H   fludrocortisone  0.2 mg Oral Daily   Gerhardt's butt cream   Topical TID   insulin aspart  0-5 Units Subcutaneous QHS   insulin aspart  4 Units Subcutaneous TID WC   insulin glargine-yfgn  20 Units Subcutaneous QHS   midodrine  2.5 mg Oral TID   mupirocin ointment  1 Application Nasal BID   polyethylene glycol  17 g Oral Daily   senna  1 tablet Oral Daily   sodium chloride flush  3 mL Intravenous Q12H   tamsulosin  0.4 mg Oral QPC supper   Continuous Infusions:  vancomycin 1,250 mg (12/30/23 1227)     LOS: 3 days   35 minutes with more than 50% spent in reviewing records, counseling patient/family and coordinating care.  Trenton Frock, MD [facility] [email or web address] 12/30/2023, 2:31 PM

## 2023-12-31 ENCOUNTER — Other Ambulatory Visit (HOSPITAL_COMMUNITY): Payer: Self-pay

## 2023-12-31 DIAGNOSIS — N390 Urinary tract infection, site not specified: Secondary | ICD-10-CM | POA: Diagnosis not present

## 2023-12-31 DIAGNOSIS — A419 Sepsis, unspecified organism: Secondary | ICD-10-CM | POA: Diagnosis not present

## 2023-12-31 LAB — CBC
HCT: 34.6 % — ABNORMAL LOW (ref 39.0–52.0)
Hemoglobin: 11.4 g/dL — ABNORMAL LOW (ref 13.0–17.0)
MCH: 29.9 pg (ref 26.0–34.0)
MCHC: 32.9 g/dL (ref 30.0–36.0)
MCV: 90.8 fL (ref 80.0–100.0)
Platelets: 282 10*3/uL (ref 150–400)
RBC: 3.81 MIL/uL — ABNORMAL LOW (ref 4.22–5.81)
RDW: 13.9 % (ref 11.5–15.5)
WBC: 4.7 10*3/uL (ref 4.0–10.5)
nRBC: 0 % (ref 0.0–0.2)

## 2023-12-31 LAB — BASIC METABOLIC PANEL WITH GFR
Anion gap: 10 (ref 5–15)
BUN: 9 mg/dL (ref 8–23)
CO2: 25 mmol/L (ref 22–32)
Calcium: 8.9 mg/dL (ref 8.9–10.3)
Chloride: 104 mmol/L (ref 98–111)
Creatinine, Ser: 0.61 mg/dL (ref 0.61–1.24)
GFR, Estimated: >60 mL/min (ref >60)
Glucose, Bld: 213 mg/dL — ABNORMAL HIGH (ref 70–99)
Potassium: 4 mmol/L (ref 3.5–5.1)
Sodium: 139 mmol/L (ref 135–145)

## 2023-12-31 LAB — CULTURE, BLOOD (ROUTINE X 2)
Culture: NO GROWTH
Culture: NO GROWTH

## 2023-12-31 LAB — GLUCOSE, CAPILLARY
Glucose-Capillary: 201 mg/dL — ABNORMAL HIGH (ref 70–99)
Glucose-Capillary: 248 mg/dL — ABNORMAL HIGH (ref 70–99)

## 2023-12-31 LAB — MAGNESIUM: Magnesium: 2.3 mg/dL (ref 1.7–2.4)

## 2023-12-31 MED ORDER — LINEZOLID 600 MG PO TABS
600.0000 mg | ORAL_TABLET | Freq: Two times a day (BID) | ORAL | 0 refills | Status: AC
  Filled 2023-12-31: qty 12, 6d supply, fill #0

## 2023-12-31 MED ORDER — LINEZOLID 600 MG PO TABS
600.0000 mg | ORAL_TABLET | Freq: Two times a day (BID) | ORAL | Status: DC
  Administered 2023-12-31: 600 mg via ORAL
  Filled 2023-12-31: qty 1

## 2023-12-31 MED ORDER — POLYETHYLENE GLYCOL 3350 17 GM/SCOOP PO POWD
17.0000 g | Freq: Every day | ORAL | 0 refills | Status: DC | PRN
  Filled 2023-12-31: qty 238, 14d supply, fill #0

## 2023-12-31 NOTE — Inpatient Diabetes Management (Signed)
 Inpatient Diabetes Program Recommendations  AACE/ADA: New Consensus Statement on Inpatient Glycemic Control   Target Ranges:  Prepandial:   less than 140 mg/dL      Peak postprandial:   less than 180 mg/dL (1-2 hours)      Critically ill patients:  140 - 180 mg/dL    Latest Reference Range & Units 12/30/23 07:49 12/30/23 11:58 12/30/23 17:04 12/30/23 23:01 12/31/23 07:29  Glucose-Capillary 70 - 99 mg/dL 161 (H) 096 (H) 045 (H) 247 (H) 201 (H)   Review of Glycemic Control  Diabetes history: DM2 Outpatient Diabetes medications: Levemir 20 units at bedtime if CBG over 120 mg/dl, Novolog 8 units TID with meals, Amaryl 2 mg daily Current orders for Inpatient glycemic control: Semglee 20 units at bedtime, Novolog 0-5 units at bedtime, Novolog 4 units TID with meals  Inpatient Diabetes Program Recommendations:    Insulin: Please consider increasing meal coverage to Novolog 6 units TID with meals and adding Novolog 0-9 units TID with meals for correction.  Thanks, Beacher Limerick, RN, MSN, CDCES Diabetes Coordinator Inpatient Diabetes Program [phone] (Team Pager from 8am to 5pm)

## 2023-12-31 NOTE — Discharge Summary (Signed)
 Physician Discharge Summary   Patient: Micheal Moore. MRN: [id number] DOB: 1951/08/06  Admit date:     12/26/2023  Discharge date: 12/31/23  Discharge Physician: Tobey Grim   PCP: Pcp, No   Recommendations at discharge:   Patient was discharged home with 6 more days of linezolid for a total of 10 days after having been found to have MRSA UTI.  Ensure that patient has continued improvement on the oral linezolid. We stopped the patient's glimepiride at discharge.  His blood pressures were fairly well-controlled on just insulin while inpatient.  He should discuss with his PCP whether to resume glimepiride due to increased risk of hypoglycemia taking this plus insulin. Also stopped patient's midodrine at discharge.  His blood pressure actually remained high while in house 140s to 160s systolic.  Patient is supposed to taking both midodrine and blood pressure medicines outpatient and he should follow-up with his PCP to determine whether he needs the midodrine in the future. Patient needs follow-up outpatient with urology due to ongoing Foley.  Discharge Diagnoses: Principal Problem:   Sepsis secondary to UTI [facility]) Active Problems:   CAD (coronary artery disease)   Type 2 diabetes mellitus with hyperlipidemia (HCC)   Normocytic anemia   PVD (peripheral vascular disease) (HCC)   Hyperlipidemia   History of DVT (deep vein thrombosis)   Hx of left BKA (HCC)   Chronic diastolic CHF (congestive heart failure) (HCC)  Resolved Problems:   * No resolved hospital problems. [facility] Course: 73 y.o. male with medical history significant of hyperlipidemia, diabetes, CAD status post PCI April 2024, PVD, anemia, diastolic CHF, left greater potation, DVT presenting with catheter dysfunction and constipation.  In the ER on 3/17 he was disimpacted and Foley catheter was placed.  While in ER he was found to be septic with temp 100.2, tachycardia, leukocytosis.  Admitted for urosepsis.   Started on IV fluids and Rocephin.  Urine culture eventually grew MRSA and he was therefore switched to vancomycin.  On day of discharge he was switched to oral linezolid for 6 more days to fully treat his UTI for 10 total days.  Patient exhibited marked improvement prior to discharge and was back to his baseline.   Assessment and Plan:   Sepsis - Lactic acidosis, leukocytosis, tachycardia, UTI on presentation given concern for sepsis.  Received empiric antibiotics, IV fluid bolus, blood cultures.  Monitor blood pressure closely.  Blood cultures show NGTD.  - Secondary to UTI below. - Resolved   Catheter associated urinary tract infection with MRSA - Catheter exchanged in the emergency department.  Preliminary urine cultures noting MRSA.  Was on empiric ceftriaxone.  Will transition patient to vancomycin 4/12.  Appears to be resolving after catheter exchange.  No leukocytosis nor fever. -Spoke with infectious disease Dr. Algis Liming today by secure chat who recommended oral antibiotics on discharge for treatment. - Discharged home with linezolid for 10 total days of treatment, 6 more days.   Constipation: - Patient found to have fecal impaction in the emergency department and was disimpacted. - Continue MiraLAX and Senokot as needed on outpatient basis.   Acute urinary retention secondary to BPH - Foley catheter placed at previous admission.  Exchanged in the emergency department this presentation.  Will leave catheter in place for now.  Flomax on board.  Will need to follow-up with urology in the outpatient setting.   Insulin independent diabetes mellitus - Not well-controlled.   - Insulin increased yesterday to 20 units.  CBGs remain high 100s - He was on glimepiride on admission and we stopped this at discharge to prevent hypoglycemia.   Chronic HFpEF - Does not appear to be in acute exacerbation.  Resume home cardiac regimen.   Chronic orthostatic hypotension - History of being on  midodrine and fludrocortisone.  Med rec showed he had not been on fludrocortisone for at least several months. - He is actually hypertensive in the hospital. - He had both antihypertensive medications as well as midodrine on his med list.  He should follow-up with his PCP for blood pressure recheck within the week.   CAD/PVD - Continue aspirin, statin.   Goals of care - Patient motivated to be discharged home, does not want to pursue SNF for rehab.  -Encouraged home health physical therapy patient declined this and therefore he was discharged home in good condition without ancillary therapies.        Consultants: None Procedures performed: Fecal disimpaction in the emergency department Disposition: Home Diet recommendation:  Discharge Diet Orders (From admission, onward)     Start     Ordered   12/31/23 0000  Diet - low sodium heart healthy        12/31/23 1403           Carb modified diet DISCHARGE MEDICATION: Allergies as of 12/31/2023   No Known Allergies      Medication List     STOP taking these medications    fludrocortisone 0.1 MG tablet Commonly known as: FLORINEF   glimepiride 2 MG tablet Commonly known as: AMARYL   midodrine 5 MG tablet Commonly known as: PROAMATINE       TAKE these medications    aspirin EC 81 MG tablet Take 1 tablet (81 mg total) by mouth daily. Swallow whole.   atorvastatin 10 MG tablet Commonly known as: LIPITOR Take 10 mg by mouth 2 (two) times a week. Mondays and Thursday   BLOOD GLUCOSE TEST STRIPS Strp 1 each by Does not apply route 3 (three) times daily. Use as directed to check blood sugar. May dispense any manufacturer covered by patient's insurance and fits patient's device.   Ensure Max Protein Liqd Take 330 mLs (11 oz total) by mouth 2 (two) times daily.   hydrOXYzine 25 MG tablet Commonly known as: ATARAX Take 25 mg by mouth 2 (two) times daily.   insulin aspart 100 UNIT/ML FlexPen Commonly known as:  NOVOLOG Inject 8 Units into the skin 3 (three) times daily with meals. Only take if eating a meal AND Blood Glucose (BG) is 80 or higher.   insulin detemir 100 UNIT/ML FlexPen Commonly known as: LEVEMIR Inject 20 Units into the skin at bedtime. DO NOT TAKE if your blood sugars and consistently running < 120   linezolid 600 MG tablet Commonly known as: ZYVOX Take 1 tablet (600 mg total) by mouth every 12 (twelve) hours for 6 days.   ondansetron 4 MG tablet Commonly known as: ZOFRAN Take 4 mg by mouth every 8 (eight) hours as needed.   Pen Needles 31G X 5 MM Misc 1 each by Does not apply route 3 (three) times daily. May dispense any manufacturer covered by patient's insurance.   polyethylene glycol 17 g packet Commonly known as: MIRALAX / GLYCOLAX Take 17 g by mouth daily as needed for mild constipation.   rosuvastatin 5 MG tablet Commonly known as: CRESTOR Take 5 mg by mouth 2 (two) times a week.   tamsulosin 0.4 MG Caps capsule Commonly known  as: FLOMAX Take 1 capsule (0.4 mg total) by mouth daily after supper.        Discharge Exam: Filed Weights   12/26/23 0542  Weight: 88.5 kg   Gen: 73 y.o. male in no apparent distress.  Nontoxic.  Appears older than stated age. HEENT: Poor dentition Pulm: Non-labored breathing.  Clear to auscultation bilaterally.  CV: Regular rate and rhythm. No murmur, rub, or gallop. No JVD GI: Abdomen soft, non-tender, non-distended Ext: Warm, no deformities, left BKA noted Skin: No rashes, lesions  Neuro: Alert and oriented. No focal neurological deficits. Psych: Calm  Judgement and insight appear normal. Mood & affect appropriate.  Condition at discharge: good  The results of significant diagnostics from this hospitalization (including imaging, microbiology, ancillary and laboratory) are listed below for reference.   Imaging Studies: DG Chest Port 1 View Result Date: 12/26/2023 CLINICAL DATA:  Fever EXAM: PORTABLE CHEST 1 VIEW  COMPARISON:  11/04/2023 FINDINGS: Chronic interstitial coarsening at the lung bases with overall scar-like appearance that is stable. Some fibrosis seen at the right lung base on recent abdominal CT. There is no edema, consolidation, effusion, or pneumothorax. Normal heart size and mediastinal contours. IMPRESSION: Pulmonary scarring without acute superimposed finding. Electronically Signed   By: Tiburcio Pea M.D.   On: 12/26/2023 07:29   CT ABDOMEN PELVIS WO CONTRAST Result Date: 12/02/2023 CLINICAL DATA:  Concern for bowel obstruction. EXAM: CT ABDOMEN AND PELVIS WITHOUT CONTRAST TECHNIQUE: Multidetector CT imaging of the abdomen and pelvis was performed following the standard protocol without IV contrast. RADIATION DOSE REDUCTION: This exam was performed according to the departmental dose-optimization program which includes automated exposure control, adjustment of the mA and/or kV according to patient size and/or use of iterative reconstruction technique. COMPARISON:  CT abdomen pelvis dated 11/04/2023. FINDINGS: Evaluation of this exam is limited in the absence of intravenous contrast. Lower chest: Minimal bibasilar atelectasis. There is coronary vascular calcification. No intra-abdominal free air or free fluid. Hepatobiliary: The liver is unremarkable. No biliary dilatation. The gallbladder is unremarkable. Pancreas: Unremarkable. No pancreatic ductal dilatation or surrounding inflammatory changes. Spleen: Normal in size without focal abnormality. Adrenals/Urinary Tract: The adrenal glands unremarkable. There is no hydronephrosis or nephrolithiasis on either side. The visualized ureters are unremarkable. The urinary bladder is decompressed around a Foley catheter. Stomach/Bowel: There is moderate amount of stool throughout the colon and large stool in the rectal vault. There is no bowel obstruction or active inflammation. The appendix is normal. Vascular/Lymphatic: Mild aortoiliac atherosclerotic  disease. The IVC is unremarkable. No portal venous gas. There is no adenopathy. Reproductive: The prostate and seminal vessels are grossly unremarkable. No pelvic mass. Other: Small fat containing umbilical hernia. Musculoskeletal: Osteopenia with degenerative changes of the spine. Total left hip arthroplasty and status post prior ORIF of the right femoral neck. No acute osseous pathology. IMPRESSION: 1. No acute intra-abdominal or pelvic pathology. No bowel obstruction. Normal appendix. 2. Moderate colonic stool burden. Electronically Signed   By: Elgie Collard M.D.   On: 12/02/2023 17:15    Microbiology: Results for orders placed or performed during the hospital encounter of 12/26/23  Culture, blood (routine x 2)     Status: None   Collection Time: 12/26/23  7:04 AM   Specimen: BLOOD LEFT FOREARM  Result Value Ref Range Status   Specimen Description BLOOD LEFT FOREARM  Final   Special Requests   Final    BOTTLES DRAWN AEROBIC AND ANAEROBIC Blood Culture results may not be optimal due to an inadequate  volume of blood received in culture bottles   Culture   Final    NO GROWTH 5 DAYS Performed at [facility], [address] [place], [place] [address]    Report Status 12/31/2023 FINAL  Final  Resp panel by RT-PCR (RSV, Flu A&B, Covid) Anterior Nasal Swab     Status: None   Collection Time: 12/26/23  7:05 AM   Specimen: Anterior Nasal Swab  Result Value Ref Range Status   SARS Coronavirus 2 by RT PCR NEGATIVE NEGATIVE Final   Influenza A by PCR NEGATIVE NEGATIVE Final   Influenza B by PCR NEGATIVE NEGATIVE Final    Comment: (NOTE) The Xpert Xpress SARS-CoV-2/FLU/RSV plus assay is intended as an aid in the diagnosis of influenza from Nasopharyngeal swab specimens and should not be used as a sole basis for treatment. Nasal washings and aspirates are unacceptable for Xpert Xpress SARS-CoV-2/FLU/RSV testing.  Fact Sheet for  Patients: BloggerCourse.com  Fact Sheet for Healthcare Providers: SeriousBroker.it  This test is not yet approved or cleared by the [place]  FDA and has been authorized for detection and/or diagnosis of SARS-CoV-2 by FDA under an Emergency Use Authorization (EUA). This EUA will remain in effect (meaning this test can be used) for the duration of the COVID-19 declaration under Section 564(b)(1) of the Act, 21 U.S.C. section 360bbb-3(b)(1), unless the authorization is terminated or revoked.     Resp Syncytial Virus by PCR NEGATIVE NEGATIVE Final    Comment: (NOTE) Fact Sheet for Patients: BloggerCourse.com  Fact Sheet for Healthcare Providers: SeriousBroker.it  This test is not yet approved or cleared by the [place]  FDA and has been authorized for detection and/or diagnosis of SARS-CoV-2 by FDA under an Emergency Use Authorization (EUA). This EUA will remain in effect (meaning this test can be used) for the duration of the COVID-19 declaration under Section 564(b)(1) of the Act, 21 U.S.C. section 360bbb-3(b)(1), unless the authorization is terminated or revoked.  Performed at [facility], [address] [place], [place] [address]   Culture, blood (routine x 2)     Status: None   Collection Time: 12/26/23  7:09 AM   Specimen: BLOOD RIGHT HAND  Result Value Ref Range Status   Specimen Description BLOOD RIGHT HAND  Final   Special Requests   Final    AEROBIC BOTTLE ONLY Blood Culture results may not be optimal due to an inadequate volume of blood received in culture bottles   Culture   Final    NO GROWTH 5 DAYS Performed at [facility], [address] [place], [place] [address]    Report Status 12/31/2023 FINAL  Final  Urine Culture     Status: Abnormal   Collection Time: 12/26/23 11:36 AM   Specimen: Urine, Random  Result Value Ref Range Status    Specimen Description URINE, RANDOM  Final   Special Requests   Final    NONE Reflexed from [id number] Performed at [facility], [address] [place], [place] [address]    Culture (A)  Final    >=100,000 COLONIES/mL METHICILLIN RESISTANT STAPHYLOCOCCUS AUREUS   Report Status 12/28/2023 FINAL  Final   Organism ID, Bacteria METHICILLIN RESISTANT STAPHYLOCOCCUS AUREUS (A)  Final      Susceptibility   Methicillin resistant staphylococcus aureus - MIC*    CIPROFLOXACIN 4 RESISTANT Resistant     GENTAMICIN <=0.5 SENSITIVE Sensitive     NITROFURANTOIN <=16 SENSITIVE Sensitive     OXACILLIN >=4 RESISTANT  Resistant     TETRACYCLINE >=16 RESISTANT Resistant     VANCOMYCIN 1 SENSITIVE Sensitive     TRIMETH/SULFA >=320 RESISTANT Resistant     RIFAMPIN <=0.5 SENSITIVE Sensitive     Inducible Clindamycin POSITIVE Resistant     LINEZOLID 2 SENSITIVE Sensitive     * >=100,000 COLONIES/mL METHICILLIN RESISTANT STAPHYLOCOCCUS AUREUS  MRSA Next Gen by PCR, Nasal     Status: Abnormal   Collection Time: 12/27/23 12:12 PM   Specimen: Nasal Mucosa; Nasal Swab  Result Value Ref Range Status   MRSA by PCR Next Gen DETECTED (A) NOT DETECTED Final    Comment: RESULT CALLED TO, READ BACK BY AND VERIFIED WITH: RN Jannetta Men on [phone] @1700  by SM (NOTE) The GeneXpert MRSA Assay (FDA approved for NASAL specimens only), is one component of a comprehensive MRSA colonization surveillance program. It is not intended to diagnose MRSA infection nor to guide or monitor treatment for MRSA infections. Test performance is not FDA approved in patients less than 22 years old. Performed at [facility], [address], [place], [place] [address]     Labs: CBC: Recent Labs  Lab 12/26/23 0750 12/26/23 0759 12/27/23 0756 12/28/23 0931 12/29/23 1050 12/30/23 0646 12/31/23 0622  WBC 14.5*  --  8.8 5.4 5.4 4.9 4.7  NEUTROABS 12.2*  --   --   --   --   --   --   HGB 13.2   < > 10.8* 11.0* 11.3* 10.9*  11.4*  HCT 40.8   < > 32.5* 32.4* 34.1* 33.6* 34.6*  MCV 93.4  --  91.3 91.0 91.4 91.8 90.8  PLT 318  --  244 251 275 279 282   < > = values in this interval not displayed.   Basic Metabolic Panel: Recent Labs  Lab 12/27/23 0756 12/28/23 0931 12/29/23 1050 12/30/23 0646 12/31/23 0622  NA 131* 134* 135 135 139  K 3.9 4.2 4.3 4.3 4.0  CL 100 98 101 103 104  CO2 23 23 24 25 25   GLUCOSE 221* 282* 251* 200* 213*  BUN 15 16 15 14 9   CREATININE 0.85 0.76 0.69 0.69 0.61  CALCIUM 8.6* 9.0 9.0 9.0 8.9  MG  --  2.2 2.1 2.2 2.3   Liver Function Tests: Recent Labs  Lab 12/26/23 0750 12/27/23 0756  AST 17 11*  ALT 14 10  ALKPHOS 78 69  BILITOT 0.7 1.0  PROT 7.5 6.2*  ALBUMIN 3.6 2.8*   CBG: Recent Labs  Lab 12/30/23 1158 12/30/23 1704 12/30/23 2301 12/31/23 0729 12/31/23 1144  GLUCAP 199* 240* 247* 201* 248*    Discharge time spent: less than 30 minutes.  Signed: Trenton Frock, MD [facility] 12/31/2023

## 2024-01-02 NOTE — Progress Notes (Signed)
  Progress Note   Date: 12/30/2023  Patient Name: Micheal Moore.        MRN#: [id number]   Clarification of diagnosis:   Status: sepsis ruled in severe sepsis

## 2024-01-03 NOTE — Progress Notes (Signed)
 [facility] Liaison Note  01/03/2024  Micheal Moore 12/29/1950 [id number]  Location: RN Hospital Liaison screened the patient remotely at [facility].  Insurance: [employer] HMO   Micheal Moore. is a 73 y.o. male who is a Primary Care Patient of Pcp, No The patient was screened for  readmission hospitalization with noted high risk score for unplanned readmission risk with 2 IP in 6 months.  The patient was assessed for potential Care Management service needs for post hospital transition for care coordination. Review of patient's electronic medical record reveals patient was admitted for Sepsis. Pt discharged home with no anticipated needs. Aaron Aas   [facility] Care Management/Population Health does not replace or interfere with any arrangements made by the Inpatient Transition of Care team.   For questions contact:   Lilla Reichert, RN, BSN Hospital Liaison [place]   [facility], Population Health Office Hours MTWF  8:00 am-6:00 pm Direct Dial: [phone] [email or web address]

## 2024-01-06 ENCOUNTER — Encounter (HOSPITAL_COMMUNITY)

## 2024-01-07 NOTE — Assessment & Plan Note (Deleted)
 T4a on diagnosis s/p resection in 08/2020. Recent CT AP without recurrence. Will obtain CT chest Lifelong follow up with dermatology for [facility]

## 2024-01-07 NOTE — Progress Notes (Deleted)
 [facility] OFFICE PROGRESS NOTE  Patient Care Team: Pcp, No as PCP - General Elmyra Haggard, MD as PCP - Cardiology (Cardiology) Timothy Ford, MD as Consulting Physician (Orthopedic Surgery) Rosslyn Coons, MD (Endocrinology)  72 y.o.male with history of CAD, PCI, PAD, DM2, left AKA on 06/28/23, pT4a right arm melanoma referred to [facility]cology for elevated free light chain and history of melanoma T4a s/p resection in 08/2020.   Discussed with patient given the proximity with infectious process and elevated free light chain, results were indeterminate.  Will repeat labs today.  Pending results, further testing or follow-up may be needed.   He also has history of melanoma, and has not follow-up for several years.  Recommend proceed with lab and imaging.  He has pT4 disease, stage IIb and was diagnosed back before adjuvant therapy was recommended.  Discussed we will continue follow-up at this time.  This is his first visit with me. Assessment & Plan   No orders of the defined types were placed in this encounter.    Lowanda Ruddy, MD  INTERVAL HISTORY: he returns for treatment follow-up Complications related to previous cycle of chemotherapy included {NG Chemo Complications (Optional):32385}  Oncology History  Cutaneous melanoma (HCC)  10/10/2023 Initial Diagnosis   Cutaneous melanoma (HCC)   10/10/2023 Cancer Staging   Staging form: Melanoma of the Skin, AJCC 8th Edition - Clinical: Stage IIB (cT4a, cN0, cM0) - Signed by Lowanda Ruddy, MD on 10/10/2023      PHYSICAL EXAMINATION: ECOG PERFORMANCE STATUS: {CHL ONC ECOG ZO:[id number]}  There were no vitals filed for this visit. There were no vitals filed for this visit.  Relevant data reviewed during this visit included ***

## 2024-01-09 ENCOUNTER — Telehealth: Payer: Self-pay

## 2024-01-09 ENCOUNTER — Inpatient Hospital Stay: Payer: Medicare Other

## 2024-01-09 DIAGNOSIS — C439 Malignant melanoma of skin, unspecified: Secondary | ICD-10-CM

## 2024-01-09 NOTE — Telephone Encounter (Signed)
 TC to pt d/t missed appointment w/ Dr. Alita Irwin today. No answer. Left VM to call office. Pt needs to schedule PET scan and f/u lab and OV appts.

## 2024-01-09 NOTE — Telephone Encounter (Signed)
 Micheal Moore stated that she will call back to reschedule.

## 2024-01-14 ENCOUNTER — Inpatient Hospital Stay (HOSPITAL_COMMUNITY)
Admission: EM | Admit: 2024-01-14 | Discharge: 2024-01-22 | DRG: 607 | Disposition: A | Discharge status: Skilled Nursing Facility | Attending: Internal Medicine | Admitting: Internal Medicine

## 2024-01-14 ENCOUNTER — Encounter: Admitting: Orthopedic Surgery

## 2024-01-14 ENCOUNTER — Emergency Department (HOSPITAL_COMMUNITY)

## 2024-01-14 ENCOUNTER — Other Ambulatory Visit: Payer: Self-pay

## 2024-01-14 ENCOUNTER — Encounter (HOSPITAL_COMMUNITY): Payer: Self-pay | Admitting: Internal Medicine

## 2024-01-14 DIAGNOSIS — A419 Sepsis, unspecified organism: Secondary | ICD-10-CM | POA: Diagnosis not present

## 2024-01-14 DIAGNOSIS — I951 Orthostatic hypotension: Secondary | ICD-10-CM | POA: Diagnosis not present

## 2024-01-14 DIAGNOSIS — Z7401 Bed confinement status: Secondary | ICD-10-CM

## 2024-01-14 DIAGNOSIS — K5909 Other constipation: Secondary | ICD-10-CM | POA: Diagnosis present

## 2024-01-14 DIAGNOSIS — I10 Essential (primary) hypertension: Secondary | ICD-10-CM | POA: Diagnosis not present

## 2024-01-14 DIAGNOSIS — E8809 Other disorders of plasma-protein metabolism, not elsewhere classified: Secondary | ICD-10-CM | POA: Diagnosis present

## 2024-01-14 DIAGNOSIS — N400 Enlarged prostate without lower urinary tract symptoms: Secondary | ICD-10-CM | POA: Diagnosis present

## 2024-01-14 DIAGNOSIS — Z978 Presence of other specified devices: Secondary | ICD-10-CM | POA: Diagnosis not present

## 2024-01-14 DIAGNOSIS — E1165 Type 2 diabetes mellitus with hyperglycemia: Secondary | ICD-10-CM | POA: Diagnosis not present

## 2024-01-14 DIAGNOSIS — Z794 Long term (current) use of insulin: Secondary | ICD-10-CM

## 2024-01-14 DIAGNOSIS — R000 Tachycardia, unspecified: Secondary | ICD-10-CM | POA: Diagnosis not present

## 2024-01-14 DIAGNOSIS — B49 Unspecified mycosis: Secondary | ICD-10-CM | POA: Diagnosis present

## 2024-01-14 DIAGNOSIS — E871 Hypo-osmolality and hyponatremia: Secondary | ICD-10-CM | POA: Diagnosis not present

## 2024-01-14 DIAGNOSIS — Z89512 Acquired absence of left leg below knee: Secondary | ICD-10-CM | POA: Diagnosis present

## 2024-01-14 DIAGNOSIS — E1169 Type 2 diabetes mellitus with other specified complication: Secondary | ICD-10-CM | POA: Diagnosis present

## 2024-01-14 DIAGNOSIS — E1151 Type 2 diabetes mellitus with diabetic peripheral angiopathy without gangrene: Secondary | ICD-10-CM | POA: Diagnosis present

## 2024-01-14 DIAGNOSIS — Z833 Family history of diabetes mellitus: Secondary | ICD-10-CM

## 2024-01-14 DIAGNOSIS — Z9861 Coronary angioplasty status: Secondary | ICD-10-CM | POA: Diagnosis not present

## 2024-01-14 DIAGNOSIS — L03317 Cellulitis of buttock: Secondary | ICD-10-CM | POA: Diagnosis not present

## 2024-01-14 DIAGNOSIS — L259 Unspecified contact dermatitis, unspecified cause: Secondary | ICD-10-CM | POA: Diagnosis not present

## 2024-01-14 DIAGNOSIS — I739 Peripheral vascular disease, unspecified: Secondary | ICD-10-CM | POA: Diagnosis not present

## 2024-01-14 DIAGNOSIS — Z7982 Long term (current) use of aspirin: Secondary | ICD-10-CM | POA: Diagnosis not present

## 2024-01-14 DIAGNOSIS — R21 Rash and other nonspecific skin eruption: Secondary | ICD-10-CM | POA: Diagnosis not present

## 2024-01-14 DIAGNOSIS — I2583 Coronary atherosclerosis due to lipid rich plaque: Secondary | ICD-10-CM | POA: Diagnosis not present

## 2024-01-14 DIAGNOSIS — L03312 Cellulitis of back [any part except buttock]: Principal | ICD-10-CM

## 2024-01-14 DIAGNOSIS — E785 Hyperlipidemia, unspecified: Secondary | ICD-10-CM | POA: Diagnosis not present

## 2024-01-14 DIAGNOSIS — Z87891 Personal history of nicotine dependence: Secondary | ICD-10-CM

## 2024-01-14 DIAGNOSIS — D649 Anemia, unspecified: Secondary | ICD-10-CM | POA: Diagnosis present

## 2024-01-14 DIAGNOSIS — Z89612 Acquired absence of left leg above knee: Secondary | ICD-10-CM

## 2024-01-14 DIAGNOSIS — I2510 Atherosclerotic heart disease of native coronary artery without angina pectoris: Secondary | ICD-10-CM | POA: Diagnosis not present

## 2024-01-14 DIAGNOSIS — Z79899 Other long term (current) drug therapy: Secondary | ICD-10-CM | POA: Diagnosis not present

## 2024-01-14 DIAGNOSIS — I7090 Unspecified atherosclerosis: Secondary | ICD-10-CM | POA: Diagnosis not present

## 2024-01-14 DIAGNOSIS — Z823 Family history of stroke: Secondary | ICD-10-CM

## 2024-01-14 DIAGNOSIS — Z96642 Presence of left artificial hip joint: Secondary | ICD-10-CM | POA: Diagnosis present

## 2024-01-14 DIAGNOSIS — B369 Superficial mycosis, unspecified: Secondary | ICD-10-CM | POA: Diagnosis not present

## 2024-01-14 DIAGNOSIS — Z85820 Personal history of malignant melanoma of skin: Secondary | ICD-10-CM

## 2024-01-14 DIAGNOSIS — L0390 Cellulitis, unspecified: Secondary | ICD-10-CM

## 2024-01-14 LAB — BASIC METABOLIC PANEL WITH GFR
Anion gap: 13 (ref 5–15)
BUN: 13 mg/dL (ref 8–23)
CO2: 24 mmol/L (ref 22–32)
Calcium: 9 mg/dL (ref 8.9–10.3)
Chloride: 96 mmol/L — ABNORMAL LOW (ref 98–111)
Creatinine, Ser: 0.74 mg/dL (ref 0.61–1.24)
GFR, Estimated: >60 mL/min (ref >60)
Glucose, Bld: 296 mg/dL — ABNORMAL HIGH (ref 70–99)
Potassium: 3.9 mmol/L (ref 3.5–5.1)
Sodium: 133 mmol/L — ABNORMAL LOW (ref 135–145)

## 2024-01-14 LAB — CBC WITH DIFFERENTIAL/PLATELET
Abs Immature Granulocytes: 0.05 10*3/uL (ref 0.00–0.07)
Basophils Absolute: 0 10*3/uL (ref 0.0–0.1)
Basophils Relative: 0 %
Eosinophils Absolute: 0.1 10*3/uL (ref 0.0–0.5)
Eosinophils Relative: 1 %
HCT: 40.9 % (ref 39.0–52.0)
Hemoglobin: 13.2 g/dL (ref 13.0–17.0)
Immature Granulocytes: 1 %
Lymphocytes Relative: 11 %
Lymphs Abs: 1.2 10*3/uL (ref 0.7–4.0)
MCH: 30 pg (ref 26.0–34.0)
MCHC: 32.3 g/dL (ref 30.0–36.0)
MCV: 93 fL (ref 80.0–100.0)
Monocytes Absolute: 0.7 10*3/uL (ref 0.1–1.0)
Monocytes Relative: 7 %
Neutro Abs: 8.5 10*3/uL — ABNORMAL HIGH (ref 1.7–7.7)
Neutrophils Relative %: 80 %
Platelets: 299 10*3/uL (ref 150–400)
RBC: 4.4 MIL/uL (ref 4.22–5.81)
RDW: 14.2 % (ref 11.5–15.5)
WBC: 10.6 10*3/uL — ABNORMAL HIGH (ref 4.0–10.5)
nRBC: 0 % (ref 0.0–0.2)

## 2024-01-14 LAB — LACTIC ACID, PLASMA: Lactic Acid, Venous: 1.9 mmol/L (ref 0.5–1.9)

## 2024-01-14 MED ORDER — SODIUM CHLORIDE 0.9 % IV SOLN
2.0000 g | Freq: Three times a day (TID) | INTRAVENOUS | Status: DC
  Administered 2024-01-15 – 2024-01-16 (×6): 2 g via INTRAVENOUS
  Filled 2024-01-14 (×6): qty 12.5

## 2024-01-14 MED ORDER — ENOXAPARIN SODIUM 40 MG/0.4ML IJ SOSY
40.0000 mg | PREFILLED_SYRINGE | INTRAMUSCULAR | Status: DC
  Administered 2024-01-15 – 2024-01-22 (×8): 40 mg via SUBCUTANEOUS
  Filled 2024-01-14 (×8): qty 0.4

## 2024-01-14 MED ORDER — VANCOMYCIN HCL 1.25 G IV SOLR
1250.0000 mg | Freq: Two times a day (BID) | INTRAVENOUS | Status: DC
  Administered 2024-01-15 – 2024-01-16 (×4): 1250 mg via INTRAVENOUS
  Filled 2024-01-14 (×5): qty 25

## 2024-01-14 MED ORDER — IOHEXOL 350 MG/ML SOLN
75.0000 mL | Freq: Once | INTRAVENOUS | Status: AC | PRN
Start: 2024-01-14 — End: 2024-01-14
  Administered 2024-01-14: 75 mL via INTRAVENOUS

## 2024-01-14 MED ORDER — ACETAMINOPHEN 325 MG PO TABS
650.0000 mg | ORAL_TABLET | Freq: Four times a day (QID) | ORAL | Status: DC | PRN
  Administered 2024-01-15 – 2024-01-17 (×2): 650 mg via ORAL
  Filled 2024-01-14 (×2): qty 2

## 2024-01-14 MED ORDER — VANCOMYCIN HCL IN DEXTROSE 1-5 GM/200ML-% IV SOLN: 1000.0000 mg | Freq: Once | INTRAVENOUS | Status: DC

## 2024-01-14 MED ORDER — SODIUM CHLORIDE 0.9 % IV SOLN
2.0000 g | Freq: Once | INTRAVENOUS | Status: AC
  Administered 2024-01-14: 2 g via INTRAVENOUS
  Filled 2024-01-14: qty 12.5

## 2024-01-14 MED ORDER — INSULIN GLARGINE-YFGN 100 UNIT/ML ~~LOC~~ SOLN
20.0000 [IU] | Freq: Every day | SUBCUTANEOUS | Status: DC
  Administered 2024-01-14 – 2024-01-15 (×2): 20 [IU] via SUBCUTANEOUS
  Filled 2024-01-14 (×3): qty 0.2

## 2024-01-14 MED ORDER — VANCOMYCIN HCL 1500 MG/300ML IV SOLN
1500.0000 mg | Freq: Once | INTRAVENOUS | Status: AC
Start: 2024-01-14 — End: 2024-01-14
  Administered 2024-01-14: 1500 mg via INTRAVENOUS
  Filled 2024-01-14: qty 300

## 2024-01-14 MED ORDER — ACETAMINOPHEN 325 MG PO TABS
650.0000 mg | ORAL_TABLET | Freq: Once | ORAL | Status: AC
  Administered 2024-01-14: 650 mg via ORAL
  Filled 2024-01-14: qty 2

## 2024-01-14 MED ORDER — ACETAMINOPHEN 650 MG RE SUPP: 650.0000 mg | Freq: Four times a day (QID) | RECTAL | Status: DC | PRN

## 2024-01-14 MED ORDER — INSULIN ASPART 100 UNIT/ML IJ SOLN
0.0000 [IU] | Freq: Three times a day (TID) | INTRAMUSCULAR | Status: DC
  Administered 2024-01-15: 3 [IU] via SUBCUTANEOUS
  Administered 2024-01-15 (×2): 7 [IU] via SUBCUTANEOUS
  Administered 2024-01-16: 5 [IU] via SUBCUTANEOUS
  Administered 2024-01-16: 3 [IU] via SUBCUTANEOUS
  Administered 2024-01-16 – 2024-01-17 (×2): 7 [IU] via SUBCUTANEOUS
  Administered 2024-01-17: 3 [IU] via SUBCUTANEOUS
  Administered 2024-01-17 – 2024-01-18 (×4): 5 [IU] via SUBCUTANEOUS
  Administered 2024-01-19: 3 [IU] via SUBCUTANEOUS
  Administered 2024-01-19: 5 [IU] via SUBCUTANEOUS
  Administered 2024-01-19 – 2024-01-20 (×2): 3 [IU] via SUBCUTANEOUS
  Administered 2024-01-20 (×2): 5 [IU] via SUBCUTANEOUS
  Administered 2024-01-21: 3 [IU] via SUBCUTANEOUS
  Administered 2024-01-21: 1 [IU] via SUBCUTANEOUS
  Administered 2024-01-21 – 2024-01-22 (×4): 2 [IU] via SUBCUTANEOUS

## 2024-01-14 MED ORDER — LACTATED RINGERS IV SOLN: INTRAVENOUS | Status: AC

## 2024-01-14 NOTE — ED Notes (Signed)
 Pts temp 106.5 Tee MD aware.

## 2024-01-14 NOTE — Progress Notes (Addendum)
 Pharmacy Antibiotic Note  Micheal Moore. is a 73 y.o. male admitted on 01/14/2024 with cellulitis of back and groin.  Pharmacy has been consulted for cefepime /vancomycin  dosing. PMH includes recent hospitalization for MRSA UTI > patient had a urinary catheter, suspect this was the source of infection with blood cultures remaining negative and catheter was exchanged and was to complete course of linezolid  at discharge.  -WBC 11, sCr 0.74 (bl), febrile at admission -CT: no findings of deep space infection or signs of necrotizing infection -One set of blood cultures collected  Plan: -Cefepime  2g IV every 8 hours -Vancomycin  1500mg  IV x1 -Vancomycin  1250mg  IV every 12 hours (AUC 511, Vd 0.72, TBW, sCr 0.8) -Monitor renal function -Follow up signs of clinical improvement, LOT, de-escalation of antibiotics   Height: 6\' 6"  (198.1 cm) Weight: 81.6 kg (180 lb) IBW/kg (Calculated) : 91.4  Temp (24hrs), Avg:102.5 F (39.2 C), Min:98.4 F (36.9 C), Max:106.5 F (41.4 C)  Recent Labs  Lab 01/14/24 1754  WBC 10.6*  CREATININE 0.74  LATICACIDVEN 1.9    Estimated Creatinine Clearance: 94.9 mL/min (by C-G formula based on SCr of 0.74 mg/dL).    No Known Allergies  Antimicrobials this admission: Cefepime  4/29 >>  Vancomycin  4/29 >>   Microbiology results: 4/29 BCx:   Thank you for allowing pharmacy to be a part of this patient's care.  Young Hensen, PharmD. Clinical Pharmacist 01/14/2024 11:32 PM

## 2024-01-14 NOTE — H&P (Signed)
 History and Physical    Micheal Moore. ZOX:[id number] DOB: [birth year] DOA: 01/14/2024  Patient coming from: Home.  Chief Complaint: Worsening rash on the back.  HPI: Micheal Moore. is a 73 y.o. 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  As per the patient and patient's wife patient has been having this rash for almost 2 to 3 months while he was in the rehab after his lower extremity surgery.  Last few days the rash has acutely worsened with increasing pain and discharge and patient has been having subjective feeling of fever chills.  Denies any rash on the other extremities or anterior side of his body no mucosal involvement or any difficulty speaking or swallowing or breathing.  Per patient's wife patient has been only taking his insulin  at this time.  ED Course: In the ER patient had a fever of 106 F recorded initially but subsequent which it was 99 degrees.  Labs show WBC count of 10.6 sodium 133 blood glucose 296.  CT abdomen pelvis was done which does not show any deep involvement but does show some constipation and stool burden.  Given the symptoms concerning for cellulitis started on empiric antibiotics after cultures obtained.  Review of Systems: As per HPI, rest all negative.   Past Medical History:  Diagnosis Date   Cancer [facility])    right elbow melanoma   Cellulitis in diabetic foot (HCC) 04/27/2020   Diabetes mellitus without complication (HCC)    Type II   Diabetic ketoacidosis without coma associated with type 2 diabetes mellitus (HCC) 01/15/2023   Dizziness    DVT (deep venous  thrombosis) (HCC)    right leg   Elevated troponin 04/11/2023   Fall    Frequent falls    Hip fracture (HCC) 02/22/2022   Hypokalemia 04/11/2023   Near syncope    Osteomyelitis (HCC) 06/19/2023   Osteomyelitis of great toe of left foot (HCC) 04/27/2020   Syncope     Past Surgical History:  Procedure Laterality Date   AMPUTATION Left 06/28/2023   Procedure: LEFT ABOVE KNEE AMPUTATION;  Surgeon: Timothy Ford, MD;  Location: [facility] OR;  Service: Orthopedics;  Laterality: Left;   AMPUTATION TOE Left 04/28/2020   Procedure: AMPUTATION LEFT GREAT TOE;  Surgeon: Timothy Ford, MD;  Location: [facility] OR;  Service: Orthopedics;  Laterality: Left;   CORONARY/GRAFT ACUTE MI REVASCULARIZATION N/A 01/15/2023   Procedure: Coronary/Graft Acute MI Revascularization;  Surgeon: Kyra Phy, MD;  Location: [facility] INVASIVE CV LAB;  Service: Cardiovascular;  Laterality: N/A;   INTRAMEDULLARY (IM) NAIL INTERTROCHANTERIC Right 02/23/2022   Procedure: INTRAMEDULLARY (IM) NAIL INTERTROCHANTRIC, RIGHT;  Surgeon: Adonica Hoose, MD;  Location: [facility] ORS;  Service: Orthopedics;  Laterality: Right;   LEFT HEART CATH AND CORONARY ANGIOGRAPHY N/A 01/15/2023   Procedure: LEFT HEART CATH AND CORONARY ANGIOGRAPHY;  Surgeon: Kyra Phy, MD;  Location: [facility] INVASIVE CV LAB;  Service: Cardiovascular;  Laterality: N/A;   LOWER EXTREMITY ANGIOGRAPHY Bilateral 01/18/2023   Procedure: Lower Extremity Angiography;  Surgeon: Margherita Shell, MD;  Location: [facility] INVASIVE CV LAB;  Service: Cardiovascular;  Laterality: Bilateral;   MELANOMA EXCISION WITH SENTINEL  LYMPH NODE BIOPSY Right 08/19/2020   Procedure: WIDE LOCAL EXCISION RIGHT ELBOW MELANOMA WITH RIGHT AXILLARY SENTINEL LYMPH NODE BIOPSY;  Surgeon: Lujean Sake, MD;  Location: [facility] OR;  Service: General;  Laterality: Right;  2nd incision in axilla   TEE WITHOUT CARDIOVERSION N/A 04/19/2023   Procedure: TRANSESOPHAGEAL ECHOCARDIOGRAM;  Surgeon: Wendie Hamburg, MD;  Location: [facility]  INVASIVE CV LAB;  Service: Cardiovascular;  Laterality: N/A;   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   Procedure: TOTAL HIP ARTHROPLASTY ANTERIOR APPROACH;  Surgeon: Adonica Hoose, MD;  Location: [facility] ORS;  Service: Orthopedics;  Laterality: Left;   TOTAL HIP ARTHROPLASTY Left 08/24/2022   Procedure: OPEN REDUCTION, HEAD AND LINER EXCHANGE;  Surgeon: Adonica Hoose, MD;  Location: [facility] ORS;  Service: Orthopedics;  Laterality: Left;     reports that he has been smoking cigarettes. He has a 3.8 pack-year smoking history. He has never used smokeless tobacco. He reports that he does not currently use drugs. He reports that he does not drink alcohol .  No Known Allergies  Family History  Problem Relation Age of Onset   Stroke Mother    Diabetes Father    Stroke Sister    Diabetes Sister     Prior to Admission medications   Medication Sig Start Date End Date Taking? Authorizing Provider  insulin  aspart (NOVOLOG ) 100 UNIT/ML FlexPen Inject 8 Units into the skin 3 (three) times daily with meals. Only take if eating a meal AND Blood Glucose (BG) is 80 or higher. 04/22/23 01/13/25 Yes Abbe Abate, MD  insulin  detemir (LEVEMIR ) 100 UNIT/ML FlexPen Inject 20 Units into the skin at bedtime. DO NOT TAKE if your blood sugars and consistently running < 120 04/23/23  Yes Abbe Abate, MD  polyethylene glycol powder (GLYCOLAX /MIRALAX ) 17 GM/SCOOP powder Take 17 g by mouth daily as needed for mild constipation. 12/31/23  Yes Trenton Frock, MD  aspirin  EC 81 MG tablet Take 1 tablet (81 mg total) by mouth daily. Swallow whole. Patient not taking: Reported on 01/14/2024 01/20/23   Lesa Rape, MD  atorvastatin  (LIPITOR ) 10 MG tablet Take 10 mg by mouth 2 (two) times a week. Mondays and Thursday Patient not taking: Reported on 01/14/2024    [provider]  Ensure Max Protein (ENSURE MAX PROTEIN) LIQD Take 330 mLs (11 oz total) by mouth 2 (two) times daily. Patient not taking: Reported on 12/26/2023  08/28/22   Hoyt Macleod, MD  Glucose Blood (BLOOD GLUCOSE TEST STRIPS) STRP 1 each by Does not apply route 3 (three) times daily. Use as directed to check blood sugar. May dispense any manufacturer covered by patient's insurance and fits patient's device. 01/20/23   Lesa Rape, MD  hydrOXYzine  (ATARAX ) 25 MG tablet Take 25 mg by mouth 2 (two) times daily. Patient not taking: Reported on 01/14/2024 10/21/23   [provider]  Insulin  Pen Needle (PEN NEEDLES) 31G X 5 MM MISC 1 each by Does not apply route 3 (three) times daily. May dispense any manufacturer covered by patient's insurance. 01/20/23   Lesa Rape, MD  ondansetron  (ZOFRAN ) 4 MG tablet Take 4 mg by mouth every 8 (eight) hours as needed. Patient not taking: Reported on 01/14/2024 11/13/23   [provider]  rosuvastatin  (CRESTOR ) 5 MG tablet Take 5 mg by mouth 2 (two) times a week. Patient not taking: Reported on 01/14/2024 11/13/23   [provider]  tamsulosin  (FLOMAX ) 0.4 MG CAPS capsule Take 1 capsule (0.4 mg total) by mouth daily after  supper. Patient not taking: Reported on 01/14/2024 06/30/23   Maggie Schooner, MD    Physical Exam: Constitutional: Moderately built and nourished. Vitals:   01/14/24 1940 01/14/24 1950 01/14/24 2000 01/14/24 2200  BP: 123/75   (!) 104/58  Pulse: (!) 58  (!) 59 91  Resp: 18  18 18   Temp:    98.4 F (36.9 C)  TempSrc:      SpO2: 100% 100% 99% 98%  Weight:      Height:       Eyes: Anicteric no pallor. ENMT: No discharge from the ears/nose or mouth. Neck: No mass felt.  No neck rigidity. Respiratory: No rhonchi or crepitations. Cardiovascular: S1-S2 heard. Abdomen: Soft nontender bowel sound present. Musculoskeletal: Chronic skin changes of the right lower extremity.  Left AKA stump looks clean. Skin: Diffuse erythematous rash involving the lower half of the back and buttocks area with no ulceration has some clear fluid oozing.  Mildly tender but no bullae or  blebs. Neurologic: Alert awake oriented to time place and person.  Moves all extremities. Psychiatric: Appears normal.  Normal affect.   Labs on Admission: I have personally reviewed following labs and imaging studies  CBC: Recent Labs  Lab 01/14/24 1754  WBC 10.6*  NEUTROABS 8.5*  HGB 13.2  HCT 40.9  MCV 93.0  PLT 299   Basic Metabolic Panel: Recent Labs  Lab 01/14/24 1754  NA 133*  K 3.9  CL 96*  CO2 24  GLUCOSE 296*  BUN 13  CREATININE 0.74  CALCIUM  9.0   GFR: Estimated Creatinine Clearance: 94.9 mL/min (by C-G formula based on SCr of 0.74 mg/dL). Liver Function Tests: No results for input(s): "AST", "ALT", "ALKPHOS", "BILITOT", "PROT", "ALBUMIN " in the last 168 hours. No results for input(s): "LIPASE", "AMYLASE" in the last 168 hours. No results for input(s): "AMMONIA" in the last 168 hours. Coagulation Profile: No results for input(s): "INR", "PROTIME" in the last 168 hours. Cardiac Enzymes: No results for input(s): "CKTOTAL", "CKMB", "CKMBINDEX", "TROPONINI" in the last 168 hours. BNP (last 3 results) No results for input(s): "PROBNP" in the last 8760 hours. HbA1C: No results for input(s): "HGBA1C" in the last 72 hours. CBG: No results for input(s): "GLUCAP" in the last 168 hours. Lipid Profile: No results for input(s): "CHOL", "HDL", "LDLCALC", "TRIG", "CHOLHDL", "LDLDIRECT" in the last 72 hours. Thyroid  Function Tests: No results for input(s): "TSH", "T4TOTAL", "FREET4", "T3FREE", "THYROIDAB" in the last 72 hours. Anemia Panel: No results for input(s): "VITAMINB12", "FOLATE", "FERRITIN", "TIBC", "IRON", "RETICCTPCT" in the last 72 hours. Urine analysis:    Component Value Date/Time   COLORURINE YELLOW 12/26/2023 1136   APPEARANCEUR TURBID (A) 12/26/2023 1136   APPEARANCEUR Clear 12/04/2021 1430   LABSPEC 1.025 12/26/2023 1136   PHURINE 5.0 12/26/2023 1136   GLUCOSEU NEGATIVE 12/26/2023 1136   HGBUR MODERATE (A) 12/26/2023 1136   BILIRUBINUR  NEGATIVE 12/26/2023 1136   BILIRUBINUR Negative 12/04/2021 1430   KETONESUR NEGATIVE 12/26/2023 1136   PROTEINUR 100 (A) 12/26/2023 1136   NITRITE NEGATIVE 12/26/2023 1136   LEUKOCYTESUR LARGE (A) 12/26/2023 1136   Sepsis Labs: @LABRCNTIP (procalcitonin:4,lacticidven:4) )No results found for this or any previous visit (from the past 240 hours).   Radiological Exams on Admission: CT ABDOMEN PELVIS W CONTRAST Result Date: 01/14/2024 CLINICAL DATA:  Sepsis. Evaluate for necrotizing infection in the groin and back. EXAM: CT ABDOMEN AND PELVIS WITH CONTRAST TECHNIQUE: Multidetector CT imaging of the abdomen and pelvis was performed using the standard protocol following bolus administration of intravenous  contrast. RADIATION DOSE REDUCTION: This exam was performed according to the departmental dose-optimization program which includes automated exposure control, adjustment of the mA and/or kV according to patient size and/or use of iterative reconstruction technique. CONTRAST:  75mL OMNIPAQUE  IOHEXOL  350 MG/ML SOLN COMPARISON:  12/02/2023 FINDINGS: Lower chest: Emphysematous changes and scarring in the lung bases. Mild traction bronchiectasis. Hepatobiliary: No focal liver abnormality is seen. No gallstones, gallbladder wall thickening, or biliary dilatation. Pancreas: Unremarkable. No pancreatic ductal dilatation or surrounding inflammatory changes. Spleen: Normal in size without focal abnormality. Adrenals/Urinary Tract: Adrenal glands are unremarkable. Kidneys are normal, without renal calculi, focal lesion, or hydronephrosis. Bladder is decompressed with a Foley catheter. Stomach/Bowel: Stomach, small bowel, and colon are not abnormally distended. No wall thickening or inflammatory changes. Colon is diffusely stool-filled. Stool-filled rectum with dilatation to 8.3 cm diameter. This is similar to prior study and could represent chronic fecal impaction. No significant wall thickening. Appendix is normal.  Vascular/Lymphatic: No significant vascular findings are present. No enlarged abdominal or pelvic lymph nodes. Reproductive: Prostate is unremarkable. Other: No abdominal wall hernia or abnormality. No abdominopelvic ascites. Musculoskeletal: Postoperative fixation of the right hip. Left total hip arthroplasty. Degenerative changes throughout the spine. Schmorl's nodes at the inferior endplate of L2. No acute bony abnormalities. IMPRESSION: 1. Diffusely stool-filled colon. Dilated stool-filled rectum similar to previous study may indicate chronic fecal impaction. No proximal obstruction. 2. Emphysematous changes and scarring in the lung bases with mild traction bronchiectasis. 3. Aortic atherosclerosis. 4. No loculated fluid collections. No soft tissue gas to suggest necrotizing infection. Electronically Signed   By: Boyce Byes M.D.   On: 01/14/2024 19:50      Assessment/Plan Principal Problem:   Cellulitis Active Problems:   Orthostatic hypotension   Type 2 diabetes mellitus with hyperlipidemia (HCC)   PVD (peripheral vascular disease) (HCC)   Hx of left BKA (HCC)   Chronic indwelling Foley catheter    Fever with rash -   mainly involving the lower half of the back and buttock area with pain and slight oozing concerning primarily for cellulitis.  Among other differentials include contact dermatitis.  Patient states he has been having this rash for almost 2 to 3 months which has acutely worsened.  Will keep patient on empiric antibiotics will consider steroids.  Follow cultures. Diabetes mellitus type 2 last hemoglobin A1c was 8.2 today.  Takes Levemir  20 units with Premeal's insulin . CAD status post PCI denies any chest pain.  Continue aspirin  and statins. Chronic indwelling Foley catheter and BPH recently admitted for sepsis secondary UTI. Chronic anemia check anemia panel. History of orthostatic hypotension blood pressure is around normal at this time but will closely observe.  Used to  be on midodrine  and fludrocortisone  which patient has not been taking. Constipation with stool burden will order enema.  Given the cellulitis with high fevers and rash will need close monitoring further management and more than 2 midnight stay.   DVT prophylaxis: Lovenox . Code Status: Full code. Family Communication: His wife. Disposition Plan: Med floor. Consults called: Wound team. Admission status: Inpatient.

## 2024-01-14 NOTE — ED Notes (Signed)
 Patient care taken, resting and waiting for ct. Wants something to eat and drink.

## 2024-01-14 NOTE — ED Notes (Signed)
 Provider at bedisde will finish triage questions after provider assesment. Wound found on R elbow.

## 2024-01-14 NOTE — ED Notes (Signed)
 Unable to place line in pt after multiple sticks. Secondary RN asked to come to bed side for attempt.

## 2024-01-14 NOTE — ED Provider Notes (Signed)
 [place] EMERGENCY DEPARTMENT AT [facility] Provider Note   CSN: [id number] Arrival date & time: 01/14/24  1649     History  No chief complaint on file.   Delorean Duffy. is a 73 y.o. male.  73 year old male with past medical history of diabetes, hypertension, and hyperlipidemia presenting to the emergency department today with concern for cellulitis of his back and groin area.  The patient is bedbound currently.  He does have a left above-the-knee amputation.  This is reportedly been going on now for the past few weeks.  They have been treating this with topical treatments.  Patient reports that he has had some subjective fevers.  He does report some intermittent drainage on his back as well.  He was sent in by family because they are having difficulty caring for him now due to this.        Home Medications Prior to Admission medications   Medication Sig Start Date End Date Taking? Authorizing Provider  aspirin  EC 81 MG tablet Take 1 tablet (81 mg total) by mouth daily. Swallow whole. 01/20/23   Lesa Rape, MD  atorvastatin  (LIPITOR ) 10 MG tablet Take 10 mg by mouth 2 (two) times a week. Mondays and Thursday    [provider]  Ensure Max Protein (ENSURE MAX PROTEIN) LIQD Take 330 mLs (11 oz total) by mouth 2 (two) times daily. Patient not taking: Reported on 12/26/2023 08/28/22   Hoyt Macleod, MD  Glucose Blood (BLOOD GLUCOSE TEST STRIPS) STRP 1 each by Does not apply route 3 (three) times daily. Use as directed to check blood sugar. May dispense any manufacturer covered by patient's insurance and fits patient's device. 01/20/23   Lesa Rape, MD  hydrOXYzine  (ATARAX ) 25 MG tablet Take 25 mg by mouth 2 (two) times daily. 10/21/23   [provider]  insulin  aspart (NOVOLOG ) 100 UNIT/ML FlexPen Inject 8 Units into the skin 3 (three) times daily with meals. Only take if eating a meal AND Blood Glucose (BG) is 80 or higher. 04/22/23 12/26/23  Abbe Abate, MD  insulin  detemir (LEVEMIR ) 100 UNIT/ML FlexPen Inject 20 Units into the skin at bedtime. DO NOT TAKE if your blood sugars and consistently running < 120 04/23/23   Abbe Abate, MD  Insulin  Pen Needle (PEN NEEDLES) 31G X 5 MM MISC 1 each by Does not apply route 3 (three) times daily. May dispense any manufacturer covered by patient's insurance. 01/20/23   Lesa Rape, MD  ondansetron  (ZOFRAN ) 4 MG tablet Take 4 mg by mouth every 8 (eight) hours as needed. 11/13/23   [provider]  polyethylene glycol powder (GLYCOLAX /MIRALAX ) 17 GM/SCOOP powder Take 17 g by mouth daily as needed for mild constipation. 12/31/23   Trenton Frock, MD  rosuvastatin  (CRESTOR ) 5 MG tablet Take 5 mg by mouth 2 (two) times a week. 11/13/23   [provider]  tamsulosin  (FLOMAX ) 0.4 MG CAPS capsule Take 1 capsule (0.4 mg total) by mouth daily after supper. 06/30/23   Maggie Schooner, MD      Allergies    Patient has no known allergies.    Review of Systems   Review of Systems  Skin:  Positive for rash and wound.  All other systems reviewed and are negative.   Physical Exam Updated Vital Signs BP (!) 104/58   Pulse 91   Temp 98.4 F (36.9 C)   Resp 18   Ht 6\' 6"  (1.981 m)   Wt  81.6 kg   SpO2 98%   BMI 20.80 kg/m  Physical Exam Vitals and nursing note reviewed.   Gen: NAD, chronically ill-appearing Eyes: PERRL, EOMI HEENT: no oropharyngeal swelling Neck: trachea midline Resp: clear to auscultation bilaterally Card: RRR, no murmurs, rubs, or gallops Abd: nontender, nondistended Extremities: no calf tenderness, no edema Vascular: 2+ radial pulses bilaterally, 2+ DP pulses bilaterally Skin: There is significant erythema noted from the patient's thoracic region down to his lumbosacral region, there is also some erythema over the mid thigh with no crepitus noted, very minimal drainage noted from some superficial wounds over the mid and lower thoracic regions Psyc: acting  appropriately   ED Results / Procedures / Treatments   Labs (all labs ordered are listed, but only abnormal results are displayed) Labs Reviewed  CBC WITH DIFFERENTIAL/PLATELET - Abnormal; Notable for the following components:      Result Value   WBC 10.6 (*)    Neutro Abs 8.5 (*)    All other components within normal limits  BASIC METABOLIC PANEL WITH GFR - Abnormal; Notable for the following components:   Sodium 133 (*)    Chloride 96 (*)    Glucose, Bld 296 (*)    All other components within normal limits  CULTURE, BLOOD (ROUTINE X 2)  CULTURE, BLOOD (ROUTINE X 2)  LACTIC ACID, PLASMA  LACTIC ACID, PLASMA    EKG None  Radiology CT ABDOMEN PELVIS W CONTRAST Result Date: 01/14/2024 CLINICAL DATA:  Sepsis. Evaluate for necrotizing infection in the groin and back. EXAM: CT ABDOMEN AND PELVIS WITH CONTRAST TECHNIQUE: Multidetector CT imaging of the abdomen and pelvis was performed using the standard protocol following bolus administration of intravenous contrast. RADIATION DOSE REDUCTION: This exam was performed according to the departmental dose-optimization program which includes automated exposure control, adjustment of the mA and/or kV according to patient size and/or use of iterative reconstruction technique. CONTRAST:  75mL OMNIPAQUE  IOHEXOL  350 MG/ML SOLN COMPARISON:  12/02/2023 FINDINGS: Lower chest: Emphysematous changes and scarring in the lung bases. Mild traction bronchiectasis. Hepatobiliary: No focal liver abnormality is seen. No gallstones, gallbladder wall thickening, or biliary dilatation. Pancreas: Unremarkable. No pancreatic ductal dilatation or surrounding inflammatory changes. Spleen: Normal in size without focal abnormality. Adrenals/Urinary Tract: Adrenal glands are unremarkable. Kidneys are normal, without renal calculi, focal lesion, or hydronephrosis. Bladder is decompressed with a Foley catheter. Stomach/Bowel: Stomach, small bowel, and colon are not abnormally  distended. No wall thickening or inflammatory changes. Colon is diffusely stool-filled. Stool-filled rectum with dilatation to 8.3 cm diameter. This is similar to prior study and could represent chronic fecal impaction. No significant wall thickening. Appendix is normal. Vascular/Lymphatic: No significant vascular findings are present. No enlarged abdominal or pelvic lymph nodes. Reproductive: Prostate is unremarkable. Other: No abdominal wall hernia or abnormality. No abdominopelvic ascites. Musculoskeletal: Postoperative fixation of the right hip. Left total hip arthroplasty. Degenerative changes throughout the spine. Schmorl's nodes at the inferior endplate of L2. No acute bony abnormalities. IMPRESSION: 1. Diffusely stool-filled colon. Dilated stool-filled rectum similar to previous study may indicate chronic fecal impaction. No proximal obstruction. 2. Emphysematous changes and scarring in the lung bases with mild traction bronchiectasis. 3. Aortic atherosclerosis. 4. No loculated fluid collections. No soft tissue gas to suggest necrotizing infection. Electronically Signed   By: Boyce Byes M.D.   On: 01/14/2024 19:50    Procedures Procedures    Medications Ordered in ED Medications  ceFEPIme  (MAXIPIME ) 2 g in sodium chloride  0.9 % 100 mL IVPB (0  g Intravenous Stopped 01/14/24 1910)  vancomycin  (VANCOREADY) IVPB 1500 mg/300 mL (0 mg Intravenous Stopped 01/14/24 2037)  acetaminophen  (TYLENOL ) tablet 650 mg (650 mg Oral Given 01/14/24 1826)  iohexol  (OMNIPAQUE ) 350 MG/ML injection 75 mL (75 mLs Intravenous Contrast Given 01/14/24 1943)    ED Course/ Medical Decision Making/ A&P                                 Medical Decision Making 73 year old male with past medical history of diabetes, hypertension, and hyperlipidemia who is bedbound at baseline presenting to the emergency department today for concerns for cellulitis of his back.  This does appear to be very extensive.  Some of this may be  due to pressure wounds but the erythema is substantial.  Will cover the patient with IV antibiotics.  Loss obtain a CT scan to evaluate for necrotizing infection.  I will reevaluate for ultimate disposition.  The patient CT scan does not show any findings consistent with deep space infection.  This does show some chronic constipation.  Patient does have a mild leukocytosis.  Given the extensive nature of his cellulitis a call is placed to hospitalist service for admission.  It does appear that SNF was recommended during recent hospitalization and the patient declined but at this time with him having inability to care for himself at home this may be needed.  Amount and/or Complexity of Data Reviewed Labs: ordered. Radiology: ordered.  Risk OTC drugs. Prescription drug management. Decision regarding hospitalization.           Final Clinical Impression(s) / ED Diagnoses Final diagnoses:  Cellulitis of back except buttock    Rx / DC Orders ED Discharge Orders     None         Carin Charleston, MD 01/14/24 2222

## 2024-01-14 NOTE — ED Triage Notes (Signed)
 According to [facility]: Pt has massive rash on back to shoulder blades to sacral. Pt has tried to treat at home with no relief. Wife and caregiver says rash is leaking a purulent discharge. Vitals are baseline but has hx of htn.  Vitals: BP 150/90 Temp 99.7 Cbg 332 Hr 100-120 Rr 18 Spo2 100%

## 2024-01-15 DIAGNOSIS — I951 Orthostatic hypotension: Secondary | ICD-10-CM

## 2024-01-15 DIAGNOSIS — I2510 Atherosclerotic heart disease of native coronary artery without angina pectoris: Secondary | ICD-10-CM | POA: Diagnosis not present

## 2024-01-15 DIAGNOSIS — E1169 Type 2 diabetes mellitus with other specified complication: Secondary | ICD-10-CM | POA: Diagnosis not present

## 2024-01-15 DIAGNOSIS — Z89612 Acquired absence of left leg above knee: Secondary | ICD-10-CM

## 2024-01-15 DIAGNOSIS — L03312 Cellulitis of back [any part except buttock]: Secondary | ICD-10-CM | POA: Diagnosis not present

## 2024-01-15 DIAGNOSIS — Z978 Presence of other specified devices: Secondary | ICD-10-CM

## 2024-01-15 DIAGNOSIS — I739 Peripheral vascular disease, unspecified: Secondary | ICD-10-CM

## 2024-01-15 DIAGNOSIS — I2583 Coronary atherosclerosis due to lipid rich plaque: Secondary | ICD-10-CM

## 2024-01-15 LAB — CBC
HCT: 33.1 % — ABNORMAL LOW (ref 39.0–52.0)
HCT: 33.3 % — ABNORMAL LOW (ref 39.0–52.0)
Hemoglobin: 10.6 g/dL — ABNORMAL LOW (ref 13.0–17.0)
Hemoglobin: 10.7 g/dL — ABNORMAL LOW (ref 13.0–17.0)
MCH: 30.1 pg (ref 26.0–34.0)
MCH: 30.2 pg (ref 26.0–34.0)
MCHC: 31.8 g/dL (ref 30.0–36.0)
MCHC: 32.3 g/dL (ref 30.0–36.0)
MCV: 93.5 fL (ref 80.0–100.0)
MCV: 94.6 fL (ref 80.0–100.0)
Platelets: 226 10*3/uL (ref 150–400)
Platelets: 244 10*3/uL (ref 150–400)
RBC: 3.52 MIL/uL — ABNORMAL LOW (ref 4.22–5.81)
RBC: 3.54 MIL/uL — ABNORMAL LOW (ref 4.22–5.81)
RDW: 14.3 % (ref 11.5–15.5)
RDW: 14.4 % (ref 11.5–15.5)
WBC: 6.1 10*3/uL (ref 4.0–10.5)
WBC: 8 10*3/uL (ref 4.0–10.5)
nRBC: 0 % (ref 0.0–0.2)
nRBC: 0 % (ref 0.0–0.2)

## 2024-01-15 LAB — HEMOGLOBIN A1C
Hgb A1c MFr Bld: 8.2 % — ABNORMAL HIGH (ref 4.8–5.6)
Mean Plasma Glucose: 188.64 mg/dL

## 2024-01-15 LAB — COMPREHENSIVE METABOLIC PANEL WITH GFR
ALT: 10 U/L (ref 0–44)
AST: 12 U/L — ABNORMAL LOW (ref 15–41)
Albumin: 2.5 g/dL — ABNORMAL LOW (ref 3.5–5.0)
Alkaline Phosphatase: 64 U/L (ref 38–126)
Anion gap: 11 (ref 5–15)
BUN: 14 mg/dL (ref 8–23)
CO2: 22 mmol/L (ref 22–32)
Calcium: 8.7 mg/dL — ABNORMAL LOW (ref 8.9–10.3)
Chloride: 98 mmol/L (ref 98–111)
Creatinine, Ser: 0.74 mg/dL (ref 0.61–1.24)
GFR, Estimated: >60 mL/min (ref >60)
Glucose, Bld: 329 mg/dL — ABNORMAL HIGH (ref 70–99)
Potassium: 3.5 mmol/L (ref 3.5–5.1)
Sodium: 131 mmol/L — ABNORMAL LOW (ref 135–145)
Total Bilirubin: 0.9 mg/dL (ref 0.0–1.2)
Total Protein: 5.9 g/dL — ABNORMAL LOW (ref 6.5–8.1)

## 2024-01-15 LAB — RETICULOCYTES
Immature Retic Fract: 14.9 % (ref 2.3–15.9)
RBC.: 3.39 MIL/uL — ABNORMAL LOW (ref 4.22–5.81)
Retic Count, Absolute: 39.7 10*3/uL (ref 19.0–186.0)
Retic Ct Pct: 1.2 % (ref 0.4–3.1)

## 2024-01-15 LAB — IRON AND TIBC
Iron: 31 ug/dL — ABNORMAL LOW (ref 45–182)
Saturation Ratios: 19 % (ref 17.9–39.5)
TIBC: 162 ug/dL — ABNORMAL LOW (ref 250–450)
UIBC: 131 ug/dL

## 2024-01-15 LAB — FERRITIN: Ferritin: 223 ng/mL (ref 24–336)

## 2024-01-15 LAB — GLUCOSE, CAPILLARY
Glucose-Capillary: 318 mg/dL — ABNORMAL HIGH (ref 70–99)
Glucose-Capillary: 306 mg/dL — ABNORMAL HIGH (ref 70–99)
Glucose-Capillary: 219 mg/dL — ABNORMAL HIGH (ref 70–99)
Glucose-Capillary: 269 mg/dL — ABNORMAL HIGH (ref 70–99)

## 2024-01-15 LAB — CREATININE, SERUM
Creatinine, Ser: 0.77 mg/dL (ref 0.61–1.24)
GFR, Estimated: >60 mL/min (ref >60)

## 2024-01-15 LAB — VITAMIN B12: Vitamin B-12: 264 pg/mL (ref 180–914)

## 2024-01-15 LAB — MAGNESIUM: Magnesium: 1.9 mg/dL (ref 1.7–2.4)

## 2024-01-15 LAB — FOLATE: Folate: 13.1 ng/mL (ref >5.9)

## 2024-01-15 LAB — PHOSPHORUS: Phosphorus: 3.3 mg/dL (ref 2.5–4.6)

## 2024-01-15 LAB — LACTIC ACID, PLASMA: Lactic Acid, Venous: 1.3 mmol/L (ref 0.5–1.9)

## 2024-01-15 MED ORDER — CHLORHEXIDINE GLUCONATE CLOTH 2 % EX PADS
6.0000 | MEDICATED_PAD | Freq: Every day | CUTANEOUS | Status: DC
  Administered 2024-01-16 – 2024-01-22 (×7): 6 via TOPICAL

## 2024-01-15 MED ORDER — INSULIN ASPART 100 UNIT/ML IJ SOLN
6.0000 [IU] | Freq: Three times a day (TID) | INTRAMUSCULAR | Status: DC
  Administered 2024-01-15 – 2024-01-17 (×7): 6 [IU] via SUBCUTANEOUS

## 2024-01-15 MED ORDER — ASPIRIN 81 MG PO TBEC
81.0000 mg | DELAYED_RELEASE_TABLET | Freq: Every day | ORAL | Status: DC
  Administered 2024-01-15 – 2024-01-22 (×8): 81 mg via ORAL
  Filled 2024-01-15 (×8): qty 1

## 2024-01-15 MED ORDER — FLUCONAZOLE IN SODIUM CHLORIDE 200-0.9 MG/100ML-% IV SOLN
200.0000 mg | INTRAVENOUS | Status: DC
  Administered 2024-01-16 – 2024-01-18 (×3): 200 mg via INTRAVENOUS
  Filled 2024-01-15 (×5): qty 100

## 2024-01-15 MED ORDER — FLUCONAZOLE IN SODIUM CHLORIDE 400-0.9 MG/200ML-% IV SOLN
400.0000 mg | Freq: Once | INTRAVENOUS | Status: AC
  Administered 2024-01-15: 400 mg via INTRAVENOUS
  Filled 2024-01-15: qty 200

## 2024-01-15 MED ORDER — SENNOSIDES-DOCUSATE SODIUM 8.6-50 MG PO TABS
1.0000 | ORAL_TABLET | Freq: Two times a day (BID) | ORAL | Status: DC
  Administered 2024-01-15 – 2024-01-22 (×14): 1 via ORAL
  Filled 2024-01-15 (×14): qty 1

## 2024-01-15 MED ORDER — LACTATED RINGERS IV SOLN: INTRAVENOUS | Status: DC

## 2024-01-15 MED ORDER — TRIAMCINOLONE 0.1 % CREAM:EUCERIN CREAM 1:1
TOPICAL_CREAM | Freq: Three times a day (TID) | CUTANEOUS | Status: DC
  Filled 2024-01-15 (×3): qty 1

## 2024-01-15 MED ORDER — ATORVASTATIN CALCIUM 10 MG PO TABS
10.0000 mg | ORAL_TABLET | ORAL | Status: DC
  Administered 2024-01-16 – 2024-01-20 (×2): 10 mg via ORAL
  Filled 2024-01-15 (×3): qty 1

## 2024-01-15 MED ORDER — HYDROXYZINE HCL 25 MG PO TABS
25.0000 mg | ORAL_TABLET | Freq: Three times a day (TID) | ORAL | Status: DC | PRN
  Administered 2024-01-15 – 2024-01-22 (×15): 25 mg via ORAL
  Filled 2024-01-15 (×18): qty 1

## 2024-01-15 MED ORDER — TRIAMCINOLONE ACETONIDE 0.1 % EX CREA
TOPICAL_CREAM | Freq: Three times a day (TID) | CUTANEOUS | Status: DC
  Filled 2024-01-15: qty 15

## 2024-01-15 MED ORDER — BISACODYL 10 MG RE SUPP
10.0000 mg | Freq: Every day | RECTAL | Status: DC | PRN
  Administered 2024-01-17: 10 mg via RECTAL
  Filled 2024-01-15: qty 1

## 2024-01-15 MED ORDER — POLYETHYLENE GLYCOL 3350 17 G PO PACK
17.0000 g | PACK | Freq: Two times a day (BID) | ORAL | Status: DC
  Administered 2024-01-15 – 2024-01-22 (×13): 17 g via ORAL
  Filled 2024-01-15 (×14): qty 1

## 2024-01-15 MED ORDER — FLUCONAZOLE IN SODIUM CHLORIDE 200-0.9 MG/100ML-% IV SOLN
200.0000 mg | INTRAVENOUS | Status: DC
  Filled 2024-01-15: qty 100

## 2024-01-15 NOTE — Progress Notes (Signed)
 PROGRESS NOTE    Micheal Moore.  BMW:[id number] DOB: 03/26/1951 DOA: 01/14/2024 PCP: Rosslyn Coons, MD   Brief Narrative:  Micheal Moore. is a 73 y.o. 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  As per the patient and patient's wife patient has been having this rash for almost 2 to 3 months while he was in the rehab after his lower extremity surgery.  Last few days the rash has acutely worsened with increasing pain and discharge and patient has been having subjective feeling of fever chills.    ED Course: In the ER patient had a fever of 106 F recorded initially but subsequent which it was 99 degrees.  Labs show WBC count of 10.6 sodium 133 blood glucose 296.  CT abdomen pelvis was done which does not show any deep involvement but does show some constipation and stool burden.  Given the symptoms concerning for cellulitis started on empiric antibiotics after cultures obtained.  **Interim History Now adding Antifungals and Topical Medications.  Assessment and Plan:  Fever with Rash: Mainly involving the lower half of the back and buttock area with pain and slight oozing concerning primarily for cellulitis.  Among other differentials include contact dermatitis vs Fungal infection. Patient states he has been having this rash for almost 2 to 3 months which has acutely worsened. Will initiate Topicals Tx with Triamcinolone:Eucerin 1:1 TID and add Fluconazole IV. C/w Emperic Abx with IV Vanc and Cefepime . Will need Hypoallergenic Bedsheets. C/w Hydroxyzine  25 mg po TIDprn Itching. Follow Blood Cx x2 and if not improving will  need Fungal Cx. WOC to evaluate  Uncontrolled Diabetes Mellitus Type 2:  Last hemoglobin A1c was 8.2 today.  C/w Semglee  20 units sq at bedtime, Sensitive Novolog  SSI AC, and add 6 units of Novolog  TIDwm. CTM CBGs per Protocol. CBGs ranging from 306-318  CAD status post PCI: Denies any chest pain. Continue Aspirin  81 mg and Atorvastatin  10 mg po 2x Weekly   Chronic Indwelling Foley Catheter and BPH: Recently admitted for sepsis secondary UTI. Now back on Broad Spectrum Abx as above.  Hyponatremia: Na+ is gone from 133 -> 131. C/w LR @ 100 mL/hr. Repeat CMP in the AM  Normocyctic Anemia: Chronic; Likely Hemoconcentrated on Admission. Hgb/Hct went from 13.2/40.9 -> 10.7/33.1 -> 10.6/33.3. Anemia Panel was checked and showed an iron level of 31, UIBC 131, TIBC 162, saturation ratios 19%, ferritin level 223, folate level 13.1 and vitamin B12 level 264. CTM for S/Sx of Bleeding; no overt bleeding noted. Repeat CBC in the AM   Hx of Orthostatic Hypotension: BP is around normal at this time but will closely observe.  Used to be on Midodrine  and Fludrocortisone  which patient has not been taking. C/w IVF with LR at 100 mL/hr x 1 Day. PT/OT to Evaluate and Treat and check Orthostatic VS in the AM.  Constipation: With stool burden noted on CT Scan as well as dilated stool-filled rectum similar to prior study which may indicate chronic fecal impaction with no proximal obstruction; Enema ordered by Admitting. Will place on Bowel regimen with Senna-Docusate 1 tab po BID, Miralax  17 gram po BID, and Bisacodyl   10 mg RC Dailyprn Morderate Constipation  Hypoalbuminemia: Patient's Albumin  went from 3.6 -> 2.8 -> 2.5. CTM and Trend and repeat CMP in the AM   DVT prophylaxis: enoxaparin  (LOVENOX ) injection 40 mg Start: 01/15/24 1000    Code Status: Full Code Family Communication: No family present at bedside   Disposition Plan:  Level of care: Telemetry Medical Status is: Inpatient Remains inpatient appropriate  because: Needs further clinical improvement and evaluation by PT/OT    Consultants:  None  Procedures:  As delineated as above  Antimicrobials:  Anti-infectives (From admission, onward)    Start     Dose/Rate Route Frequency Ordered Stop   01/16/24 1115  fluconazole (DIFLUCAN) IVPB 200 mg       Placed in "Followed by" Linked Group   200 mg 100 mL/hr over 60 Minutes Intravenous Every 24 hours 01/15/24 1029     01/15/24 1115  fluconazole (DIFLUCAN) IVPB 200 mg  Status:  Discontinued        200 mg 100 mL/hr over 60 Minutes Intravenous Every 24 hours 01/15/24 1019 01/15/24 1029   01/15/24 1115  fluconazole (DIFLUCAN) IVPB 400 mg       Placed in "Followed by" Linked Group   400 mg 100 mL/hr over 120 Minutes Intravenous  Once 01/15/24 1029     01/15/24 0600  Vancomycin  (VANCOCIN ) 1,250 mg in sodium chloride  0.9 % 250 mL IVPB        1,250 mg 166.7 mL/hr over 90 Minutes Intravenous Every 12 hours 01/14/24 2334     01/15/24 0200  ceFEPIme  (MAXIPIME ) 2 g in sodium chloride  0.9 % 100 mL IVPB        2 g 200 mL/hr over 30 Minutes Intravenous Every 8 hours 01/14/24 2334     01/14/24 1715  vancomycin  (VANCOCIN ) IVPB 1000 mg/200 mL premix  Status:  Discontinued        1,000 mg 200 mL/hr over 60 Minutes Intravenous  Once 01/14/24 1700 01/14/24 1703   01/14/24 1715  ceFEPIme  (MAXIPIME ) 2 g in sodium chloride  0.9 % 100 mL IVPB        2 g 200 mL/hr over 30 Minutes Intravenous  Once 01/14/24 1700 01/14/24 1910   01/14/24 1715  vancomycin  (VANCOREADY) IVPB 1500 mg/300 mL        1,500 mg 150 mL/hr over 120 Minutes Intravenous  Once 01/14/24 1703 01/14/24 2037       Subjective: Seen and examined at bedside and was complaining of some back itching and states that he can even touch his back rash now.  No nausea or vomiting.  Does not feel very well.  No other concerns or complaints at this time.  Objective: Vitals:   01/15/24 0700 01/15/24 0745 01/15/24 0925 01/15/24 1000  BP: 122/60 (!) 137/57  (!) 142/61   Pulse: 84 85 88   Resp: 16 18 20 18   Temp: 98 F (36.7 C) 98.2 F (36.8 C) 97.9 F (36.6 C)   TempSrc: Oral Oral Oral   SpO2: 98% 98% 100%   Weight:      Height:        Intake/Output Summary (Last 24 hours) at 01/15/2024 1255 Last data filed at 01/15/2024 1225 Gross per 24 hour  Intake 2337.28 ml  Output 1050 ml  Net 1287.28 ml   Filed Weights   01/14/24 1654  Weight: 81.6 kg   Examination: Physical Exam:  Constitutional: Chronically ill-appearing Caucasian male who appears a little uncomfortable Respiratory: Diminished to auscultation bilaterally, no wheezing,  rales, rhonchi or crackles. Normal respiratory effort and patient is not tachypenic. No accessory muscle use.  Cardiovascular: RRR, no murmurs / rubs / gallops. S1 and S2 auscultated. No extremity edema. 2+ pedal pulses. No carotid bruits.  Abdomen: Soft, non-tender, non-distended. No masses palpated. No appreciable hepatosplenomegaly. Bowel sounds positive.  GU: Deferred. Musculoskeletal: Left AKA Skin: Has a Erythematous rash on lower back and sacrum with some mild drainage Neurologic: CN 2-12 grossly intact with no focal deficits. Romberg sign cerebellar reflexes not assessed.  Psychiatric: Normal judgment and insight. Alert and oriented x 3. Appears anxious   Data Reviewed: I have personally reviewed following labs and imaging studies  CBC: Recent Labs  Lab 01/14/24 1754 01/15/24 0001 01/15/24 0430  WBC 10.6* 8.0 6.1  NEUTROABS 8.5*  --   --   HGB 13.2 10.7* 10.6*  HCT 40.9 33.1* 33.3*  MCV 93.0 93.5 94.6  PLT 299 244 226   Basic Metabolic Panel: Recent Labs  Lab 01/14/24 1754 01/15/24 0001 01/15/24 0428 01/15/24 0430  NA 133*  --   --  131*  K 3.9  --   --  3.5  CL 96*  --   --  98  CO2 24  --   --  22  GLUCOSE 296*  --   --  329*  BUN 13  --   --  14  CREATININE 0.74 0.77  --  0.74  CALCIUM  9.0  --   --  8.7*  MG  --   --  1.9  --   PHOS  --   --  3.3  --    GFR: Estimated  Creatinine Clearance: 94.9 mL/min (by C-G formula based on SCr of 0.74 mg/dL). Liver Function Tests: Recent Labs  Lab 01/15/24 0430  AST 12*  ALT 10  ALKPHOS 64  BILITOT 0.9  PROT 5.9*  ALBUMIN  2.5*   No results for input(s): "LIPASE", "AMYLASE" in the last 168 hours. No results for input(s): "AMMONIA" in the last 168 hours. Coagulation Profile: No results for input(s): "INR", "PROTIME" in the last 168 hours. Cardiac Enzymes: No results for input(s): "CKTOTAL", "CKMB", "CKMBINDEX", "TROPONINI" in the last 168 hours. BNP (last 3 results) No results for input(s): "PROBNP" in the last 8760 hours. HbA1C: Recent Labs    01/15/24 0001  HGBA1C 8.2*   CBG: Recent Labs  Lab 01/15/24 0916 01/15/24 1117  GLUCAP 318* 306*   Lipid Profile: No results for input(s): "CHOL", "HDL", "LDLCALC", "TRIG", "CHOLHDL", "LDLDIRECT" in the last 72 hours. Thyroid  Function Tests: No results for input(s): "TSH", "T4TOTAL", "FREET4", "T3FREE", "THYROIDAB" in the last 72 hours. Anemia Panel: Recent Labs    01/15/24 0430  VITAMINB12 264  FOLATE 13.1  FERRITIN 223  TIBC 162*  IRON 31*  RETICCTPCT 1.2   Sepsis Labs: Recent Labs  Lab 01/14/24 1754 01/15/24 0849  LATICACIDVEN 1.9 1.3   Recent Results (from the past 240 hours)  Blood culture (routine x 2)     Status: None (Preliminary result)   Collection Time: 01/14/24  5:54 PM   Specimen: BLOOD LEFT FOREARM  Result Value Ref Range Status   Specimen Description BLOOD LEFT FOREARM  Final   Special Requests   Final    BOTTLES DRAWN AEROBIC AND ANAEROBIC Blood Culture results may not be optimal due to an inadequate volume of blood received in culture bottles   Culture   Final    NO GROWTH < 12 HOURS Performed at [facility], [address], [place], [place] [address]    Report Status PENDING  Incomplete  Blood culture (routine x 2)     Status: None (Preliminary result)   Collection Time: 01/15/24 12:01 AM   Specimen: BLOOD RIGHT  HAND  Result Value Ref Range Status   Specimen Description BLOOD RIGHT HAND  Final   Special Requests   Final    BOTTLES DRAWN AEROBIC AND ANAEROBIC Blood Culture adequate volume   Culture   Final    NO GROWTH < 12 HOURS Performed at [facility], [address], [place], [place] [address]    Report Status PENDING  Incomplete    Radiology Studies: CT ABDOMEN PELVIS W CONTRAST Result Date: 01/14/2024 CLINICAL DATA:  Sepsis. Evaluate for necrotizing infection in the groin and back. EXAM: CT ABDOMEN AND PELVIS WITH CONTRAST TECHNIQUE: Multidetector CT imaging of the abdomen and pelvis was performed using the standard protocol following bolus administration of intravenous contrast. RADIATION DOSE REDUCTION: This exam was performed according to the departmental dose-optimization program which includes automated exposure control, adjustment of the mA and/or kV according to patient size and/or use of iterative reconstruction technique. CONTRAST:  75mL OMNIPAQUE  IOHEXOL  350 MG/ML SOLN COMPARISON:  12/02/2023 FINDINGS: Lower chest: Emphysematous changes and scarring in the lung bases. Mild traction bronchiectasis. Hepatobiliary: No focal liver abnormality is seen. No gallstones, gallbladder wall thickening, or biliary dilatation. Pancreas: Unremarkable. No pancreatic ductal dilatation or surrounding inflammatory changes. Spleen: Normal in size without focal abnormality. Adrenals/Urinary Tract: Adrenal glands are unremarkable. Kidneys are normal, without renal calculi, focal lesion, or hydronephrosis. Bladder is decompressed with a Foley catheter. Stomach/Bowel: Stomach, small bowel, and colon are not abnormally distended. No wall thickening or inflammatory changes. Colon is diffusely stool-filled. Stool-filled rectum with dilatation to 8.3 cm diameter. This is similar to prior study and could represent chronic fecal impaction. No significant wall thickening. Appendix is normal. Vascular/Lymphatic: No  significant vascular findings are present. No enlarged abdominal or pelvic lymph nodes. Reproductive: Prostate is unremarkable. Other: No abdominal wall hernia or abnormality. No abdominopelvic ascites. Musculoskeletal: Postoperative fixation of the right hip. Left total hip arthroplasty. Degenerative changes throughout the spine. Schmorl's nodes at the inferior endplate of L2. No acute bony abnormalities. IMPRESSION: 1. Diffusely stool-filled colon. Dilated stool-filled rectum similar to previous study may indicate chronic fecal impaction. No proximal obstruction. 2. Emphysematous changes and scarring in the lung bases with mild traction bronchiectasis. 3. Aortic atherosclerosis. 4. No loculated fluid collections. No soft tissue gas to suggest necrotizing infection. Electronically Signed   By: Boyce Byes M.D.   On: 01/14/2024 19:50   Scheduled Meds:  aspirin  EC  81 mg Oral Daily   [START ON 01/16/2024] atorvastatin   10 mg Oral Once per day on Monday Thursday   [START ON 01/16/2024] Chlorhexidine  Gluconate Cloth  6 each Topical Daily   enoxaparin  (LOVENOX ) injection  40 mg Subcutaneous Q24H   insulin  aspart  0-9 Units Subcutaneous TID WC   insulin  aspart  6 Units Subcutaneous TID WC   insulin  glargine-yfgn  20 Units Subcutaneous QHS   polyethylene glycol  17 g Oral BID   senna-docusate  1 tablet Oral BID   triamcinolone 0.1 % cream : eucerin   Topical TID   Continuous Infusions:  ceFEPime  (MAXIPIME ) IV 2 g (01/15/24 0922)   fluconazole (DIFLUCAN) IV 400 mg (01/15/24 1148)   Followed by   Cecily Cohen ON 01/16/2024] fluconazole (DIFLUCAN) IV     lactated ringers  100 mL/hr at 01/15/24 1019  vancomycin  Stopped (01/15/24 0929)    LOS: 1 day   Aura Leeds, DO [facility] Available via Epic secure chat 7am-7pm After these hours, please refer to coverage provider listed on [email or web address] 01/15/2024, 12:55 PM

## 2024-01-15 NOTE — Plan of Care (Signed)

## 2024-01-15 NOTE — Consult Note (Signed)
 WOC Nurse Consult Note: Reason for Consult:Erythematous rash to lower back and sacrum.  Tender to touch. Duration 2-3 months.  History candidiasis to skin. HAs been ordered Diflucan for fungal overgrowth.  WBC down from 10.6 to 6.1 this AM.  No evidence of cellulitis.   Wound type:inflammatory Pressure Injury POA: NA Measurement:generalized to lower back  Wound bed: red, weeping and tender to touch Drainage (amount, consistency, odor) serosanguinous weeping  No odor Periwound: dry skin  Dressing procedure/placement/frequency: Cleanse back with soap and water  and apply triamcinolone /eucerin compound cream three times daily.  Will not follow at this time.  Please re-consult if needed.  Branda Cain MSN, RN, FNP-BC CWON Wound, Ostomy, Continence Nurse Outpatient [facility] [phone] Pager [phone]

## 2024-01-15 NOTE — Hospital Course (Addendum)
 Micheal Moore. is a 73 y.o. 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  As per the patient and patient's wife patient has been having this rash for almost 2 to 3 months while he was in the rehab after his lower extremity surgery.  Last few days the rash has acutely worsened with increasing pain and discharge and patient has been having subjective feeling of fever chills.    ED Course: In the ER patient had a fever of 106 F recorded initially but subsequent which it was 99 degrees.  Labs show WBC count of 10.6 sodium 133 blood glucose 296.  CT abdomen pelvis was done which does not show any deep involvement but does show some constipation and stool burden.  Given the symptoms concerning for cellulitis started on empiric antibiotics after cultures obtained.  **Interim History Now adding Antifungals and Topical Medications being changed.  Unlikely this this is cellulitis we will stop his antibiotics and monitor off.  Change of the antifungals and adding topical antifungal as his rash is improving slowly.  Not as painful to the patient.  Right foot was little bit colder yesterday so we will obtain ABIs as pedal pulse was not able to be palpated was able to palpate a pulse behind the ankle.  Assessment and Plan:  Fever with Rash: Fever resolved. Rash improving.Mainly involving the lower half of the back and buttock area with pain and slight oozing; Initially thought to be Cellulitis but more characteristic of Fungal Infection. Among other differentials include contact dermatitis vs Cellulitis. Patient states he has been having this rash for  almost 2 to 3 months which has acutely worsened. ? Fever being spurious as resolved and has not had any since. Changed Topicals Tx Triamcinolone :Eucerin 1:1 TID to Clotrimazole  1%. C/w Fluconazole  but changed to po. D/C'd Empiric Abx with IV Vanc and Cefepime  as unlikely Cellulitis so will monitor off Abx. Will need Hypoallergenic Bedsheets. C/w Hydroxyzine  25 mg po TIDprn Itching. Blood Cx x2 showing NGTD @ 5 Days and if not improving will need Fungal Cx. WOC to evaluate; C/w low loss air mattress. PT/OT recommend SNF but Insurance offered Peer to Peer which was done but has been denied to go back to SNF based on his current level of mobility.  They feel that he is dependent and would not need skilled care.  Updated the patient and he has started Appeal Process  Uncontrolled Diabetes Mellitus Type 2:  Last hemoglobin A1c was 8.2 . Increased Semglee  to 60 units sq Daily today, Sensitive Novolog  SSI AC, and increased 8 units of Novolog  TIDwm to 10 units. CTM CBGs per Protocol. CBGs ranging from 215-287.  Will need tighter blood sugar control  CAD status post PCI: Denies any chest pain. Continue Aspirin  81 mg and Atorvastatin  10 mg po 2x Weekly   Suspected PAD: Foot colder yesterday so will Obtain ABI and pending. C/w ASA and Atorvastatin . Follow ABI results and may need Vascular evalaution  Chronic Indwelling Foley Catheter and BPH: Recently admitted for sepsis secondary UTI.  Broad Spectrum Abx have stopped as above.   Hyponatremia: Na+ went from 133 -> 131 -> 136 ->  135 -> 133 -> 135. LR @ 100 mL/hr now stopped. Repeat CMP in the AM  Normocyctic Anemia: Chronic; Hgb/Hct dropped from admission at 13.2/40.9-> 11.9/36.4 but is now 13.2/40.9. Anemia Panel was checked and showed an iron level of 31, UIBC 131, TIBC 162, saturation ratios 19%, ferritin level 223, folate level 13.1 and vitamin B12 level 264. CTM for S/Sx of Bleeding; no overt bleeding noted. Repeat CBC in the AM   Hx of Orthostatic  Hypotension: BP is around normal at this time but will closely observe.  Used to be on Midodrine  and Fludrocortisone  which patient has not been taking. IVF with LR at 100 mL/hr stopped. PT/OT recommending SNF; Check Orthostatic VS in the AM.  Constipation: With stool burden noted on CT Scan as well as dilated stool-filled rectum similar to prior study which may indicate chronic fecal impaction with no proximal obstruction; Enema ordered by Admitting but may need to be repeated. Will place on Bowel regimen with Senna-Docusate 1 tab po BID, Miralax  17 gram po BID, and Bisacodyl  10 mg RC Dailyprn Morderate Constipation  Hypoalbuminemia: Patient's Albumin  trending down and went from 3.6 -> 2.6 -> 2.8 -> 3.1 -> 3.5. CTM and Trend and repeat CMP in the AM

## 2024-01-15 NOTE — ED Notes (Signed)
 CCMD called for cardiac monitoring.

## 2024-01-15 NOTE — Inpatient Diabetes Management (Signed)
 Inpatient Diabetes Program Recommendations  AACE/ADA: New Consensus Statement on Inpatient Glycemic Control (2015)  Target Ranges:  Prepandial:   less than 140 mg/dL      Peak postprandial:   less than 180 mg/dL (1-2 hours)      Critically ill patients:  140 - 180 mg/dL   Lab Results  Component Value Date   GLUCAP 318 (H) 01/15/2024   HGBA1C 8.2 (H) 01/15/2024    Review of Glycemic Control  Latest Reference Range & Units 01/15/24 09:16  Glucose-Capillary 70 - 99 mg/dL 604 (H)   Diabetes history: DM  Outpatient Diabetes medications:  Novolog  8 units tid with meals  tid with meals Levemir  20 units daily Current orders for Inpatient glycemic control: Novolog  0-9 units tid with meals Semglee  20 units daily  Inpatient Diabetes Program Recommendations:    Please consider adding Novolog  meal coverage 6 units tid with meals (hold if patient eats less than 50% or NPO).   Thanks,  Josefa Ni, RN, BC-ADM Inpatient Diabetes Coordinator Pager [phone]  (8a-5p)

## 2024-01-15 NOTE — Progress Notes (Addendum)
 Pharmacy Antibiotic Note  Sherlock Callery. is a 73 y.o. male admitted on 01/14/2024 with cellulitis.  Pharmacy has been consulted for diflucan dosing.  Plan: Diflucan 400 mg iv x1 followed by 200 mg iv qday thereafter Follow up for de-escalation / duration of therapy/conversion to PO when clinically appropriate  Height: 6\' 6"  (198.1 cm) Weight: 81.6 kg (180 lb) IBW/kg (Calculated) : 91.4  Temp (24hrs), Avg:99.9 F (37.7 C), Min:98 F (36.7 C), Max:106.5 F (41.4 C)  Recent Labs  Lab 01/14/24 1754 01/15/24 0001 01/15/24 0430  WBC 10.6* 8.0 6.1  CREATININE 0.74 0.77 0.74  LATICACIDVEN 1.9  --   --     Estimated Creatinine Clearance: 94.9 mL/min (by C-G formula based on SCr of 0.74 mg/dL).    No Known Allergies    Thank you for allowing pharmacy to be a part of this patient's care.  Marleta Simmer BS, PharmD, BCPS Clinical Pharmacist 01/15/2024 10:20 AM  Contact: [phone] after 3 PM  "Be curious, not judgmental..." -Rumalda Counter

## 2024-01-16 ENCOUNTER — Encounter: Admitting: Orthopedic Surgery

## 2024-01-16 DIAGNOSIS — I951 Orthostatic hypotension: Secondary | ICD-10-CM | POA: Diagnosis not present

## 2024-01-16 DIAGNOSIS — L03312 Cellulitis of back [any part except buttock]: Secondary | ICD-10-CM | POA: Diagnosis not present

## 2024-01-16 DIAGNOSIS — E1169 Type 2 diabetes mellitus with other specified complication: Secondary | ICD-10-CM | POA: Diagnosis not present

## 2024-01-16 DIAGNOSIS — I2510 Atherosclerotic heart disease of native coronary artery without angina pectoris: Secondary | ICD-10-CM | POA: Diagnosis not present

## 2024-01-16 DIAGNOSIS — B369 Superficial mycosis, unspecified: Secondary | ICD-10-CM

## 2024-01-16 LAB — CBC WITH DIFFERENTIAL/PLATELET
Abs Immature Granulocytes: 0.03 10*3/uL (ref 0.00–0.07)
Basophils Absolute: 0 10*3/uL (ref 0.0–0.1)
Basophils Relative: 1 %
Eosinophils Absolute: 0.2 10*3/uL (ref 0.0–0.5)
Eosinophils Relative: 5 %
HCT: 29.1 % — ABNORMAL LOW (ref 39.0–52.0)
Hemoglobin: 9.8 g/dL — ABNORMAL LOW (ref 13.0–17.0)
Immature Granulocytes: 1 %
Lymphocytes Relative: 29 %
Lymphs Abs: 1.1 10*3/uL (ref 0.7–4.0)
MCH: 30.9 pg (ref 26.0–34.0)
MCHC: 33.7 g/dL (ref 30.0–36.0)
MCV: 91.8 fL (ref 80.0–100.0)
Monocytes Absolute: 0.3 10*3/uL (ref 0.1–1.0)
Monocytes Relative: 8 %
Neutro Abs: 2.3 10*3/uL (ref 1.7–7.7)
Neutrophils Relative %: 56 %
Platelets: 228 10*3/uL (ref 150–400)
RBC: 3.17 MIL/uL — ABNORMAL LOW (ref 4.22–5.81)
RDW: 14.4 % (ref 11.5–15.5)
WBC: 4 10*3/uL (ref 4.0–10.5)
nRBC: 0 % (ref 0.0–0.2)

## 2024-01-16 LAB — GLUCOSE, CAPILLARY
Glucose-Capillary: 329 mg/dL — ABNORMAL HIGH (ref 70–99)
Glucose-Capillary: 233 mg/dL — ABNORMAL HIGH (ref 70–99)
Glucose-Capillary: 282 mg/dL — ABNORMAL HIGH (ref 70–99)
Glucose-Capillary: 265 mg/dL — ABNORMAL HIGH (ref 70–99)

## 2024-01-16 LAB — COMPREHENSIVE METABOLIC PANEL WITH GFR
ALT: 12 U/L (ref 0–44)
AST: 11 U/L — ABNORMAL LOW (ref 15–41)
Albumin: 2.3 g/dL — ABNORMAL LOW (ref 3.5–5.0)
Alkaline Phosphatase: 57 U/L (ref 38–126)
Anion gap: 8 (ref 5–15)
BUN: 11 mg/dL (ref 8–23)
CO2: 26 mmol/L (ref 22–32)
Calcium: 8.8 mg/dL — ABNORMAL LOW (ref 8.9–10.3)
Chloride: 102 mmol/L (ref 98–111)
Creatinine, Ser: 0.75 mg/dL (ref 0.61–1.24)
GFR, Estimated: >60 mL/min (ref >60)
Glucose, Bld: 321 mg/dL — ABNORMAL HIGH (ref 70–99)
Potassium: 4.2 mmol/L (ref 3.5–5.1)
Sodium: 136 mmol/L (ref 135–145)
Total Bilirubin: 0.6 mg/dL (ref 0.0–1.2)
Total Protein: 5.9 g/dL — ABNORMAL LOW (ref 6.5–8.1)

## 2024-01-16 LAB — PHOSPHORUS: Phosphorus: 3.4 mg/dL (ref 2.5–4.6)

## 2024-01-16 LAB — MAGNESIUM: Magnesium: 2 mg/dL (ref 1.7–2.4)

## 2024-01-16 MED ORDER — INSULIN GLARGINE-YFGN 100 UNIT/ML ~~LOC~~ SOLN
30.0000 [IU] | Freq: Every day | SUBCUTANEOUS | Status: DC
  Administered 2024-01-16: 30 [IU] via SUBCUTANEOUS
  Filled 2024-01-16 (×2): qty 0.3

## 2024-01-16 NOTE — Plan of Care (Signed)

## 2024-01-16 NOTE — Progress Notes (Signed)
 OT Cancellation Note  Patient Details Name: Micheal Moore. MRN: [id number] DOB: [birth year]   Cancelled Treatment:    Reason Eval/Treat Not Completed: (P) Patient declined, states he is finally comfortable in bed and no pain when he's not moving. Requested to return later to attempt transfer to recliner. Will return later  Scherry Curtis 01/16/2024, 1:44 PM

## 2024-01-16 NOTE — Evaluation (Signed)
 Occupational Therapy Evaluation Patient Details Name: Micheal Moore. MRN: [id number] DOB: 1951-02-06 Today's Date: 01/16/2024   History of Present Illness   Pt is 73 yo presenting to [facility] ED on 4/29 due to worsening back rash. Pt was recently admitted to hospital secondary to catheter related UTI with sepsis. 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     Clinical Impressions Pt  c/o no pain at rest, with any movement reports 8/10 pain to back/bottom. Pt has been staying at SNF, plans to eventually return home to wife who assists with all ADLs at bed level, reports he has hospital bed that is broken/sagging int he middle, makes it difficult to move around. Pt at this time requires mod A to assist to EOB, poor sitting balance, not able to tolerate challenge without bed rails. Not able to laterally scoot after repeated attempts, did not attempt sliding board. Pt total A for LB ADLs, mod A for UB ADLs. Recommending return to postacute rehab <3hrs/day to manage pain to back rash, maximize strength, and improve functional independence with ADLs. Will continue to see Pt acutely to progress as able. Pt would benefit from air mattress as Pt is largely bed bound and unable to lay on his side to relieve pressure from extensive wound on back.      If plan is discharge home, recommend the following:   Two people to help with walking and/or transfers;A lot of help with bathing/dressing/bathroom;Assistance with cooking/housework;Assist for transportation     Functional Status Assessment   Patient has had a recent decline in their functional status and demonstrates the ability to make significant improvements in function in a reasonable and predictable amount of time.     Equipment Recommendations   Other (comment) (defer)     Recommendations for Other Services         Precautions/Restrictions    Precautions Precautions: Fall;Other (comment) (skin) Recall of Precautions/Restrictions: Impaired Restrictions Weight Bearing Restrictions Per Provider Order: No     Mobility Bed Mobility Overal bed mobility: Needs Assistance Bed Mobility: Supine to Sit, Sit to Supine     Supine to sit: Mod assist, +2 for safety/equipment, Used rails Sit to supine: Mod assist, +2 for safety/equipment, Used rails   General bed mobility comments: mod A x2 for safety, Pt does not have good sitting balance EOB, not able to laterally scoot without max A    Transfers Overall transfer level: Needs assistance                 General transfer comment: not able to laterally scoot with slide board or lat scoot on EOB      Balance Overall balance assessment: Needs assistance Sitting-balance support: No upper extremity supported, Feet supported Sitting balance-[name]: Poor Sitting balance - Comments: Pt requires mod A to get to sitting position EOB. Once sitting, fair balance but not able to tolerate challenge.                                   ADL either performed or assessed with clinical judgement   ADL Overall ADL's : Needs assistance/impaired Eating/Feeding: Set up;Sitting;Bed level   Grooming: Set up;Sitting;Bed level   Upper Body Bathing: Moderate assistance;Bed level   Lower Body Bathing: Total assistance;Bed level   Upper Body Dressing : Moderate assistance;Sitting;Bed level  Lower Body Dressing: Total assistance;Bed level       Toileting- Clothing Manipulation and Hygiene: Maximal assistance;Bed level         General ADL Comments: Pt at bed level at this time, able to sit EOB with increased effort, mod A, poor sitting balance. Total for LB ADLs, mod A for UB ADLs sitting or at bed level.     Vision         Perception         Praxis         Pertinent Vitals/Pain Pain Assessment Pain Assessment: Faces Faces Pain Scale: Hurts whole lot Pain  Location: back and buttocks with movement Pain Descriptors / Indicators: Burning Pain Intervention(s): Monitored during session     Extremity/Trunk Assessment Upper Extremity Assessment Upper Extremity Assessment: Generalized weakness   Lower Extremity Assessment Lower Extremity Assessment: Defer to PT evaluation       Communication Communication Communication: No apparent difficulties   Cognition Arousal: Alert Behavior During Therapy: WFL for tasks assessed/performed Cognition: No apparent impairments             OT - Cognition Comments: grossly WFLs                 Following commands: Intact       Cueing  General Comments   Cueing Techniques: Verbal cues      Exercises     Shoulder Instructions      Home Living Family/patient expects to be discharged to:: Private residence Living Arrangements: Spouse/significant other Available Help at Discharge: Family;Available PRN/intermittently Type of Home: House Home Access: Stairs to enter [employer] of Steps: 2 to the poarch and one from porch into home Entrance Stairs-Rails: None Home Layout: One level     Bathroom Shower/Tub: Producer, television/film/video: Standard     Home Equipment: Agricultural consultant (2 wheels);Wheelchair - manual;Hospital bed   Additional Comments: pt states his hospital bed is messed up and sagging in the middle. He has no rails which makes it impossible for him to get to the edge of bed.      Prior Functioning/Environment Prior Level of Function : Needs assist             Mobility Comments: Pt has a prosthetic at [employer] he has not picked up and has not been wearing his shrinker socks. ADLs Comments: assists at bed level, wife completes most of his care    OT Problem List: Decreased strength;Decreased range of motion;Decreased activity tolerance;Impaired balance (sitting and/or standing);Decreased safety awareness;Pain   OT Treatment/Interventions:  Self-care/ADL training;Therapeutic exercise;DME and/or AE instruction;Energy conservation;Therapeutic activities;Patient/family education      OT Goals(Current goals can be found in the care plan section)   Acute Rehab OT Goals Patient Stated Goal: to manage pain OT Goal Formulation: With patient Time For Goal Achievement: 01/30/24 Potential to Achieve Goals: Good   OT Frequency:  Min 2X/week    Co-evaluation              AM-PAC OT "6 Clicks" Daily Activity     Outcome Measure Help from another person eating meals?: A Little Help from another person taking care of personal grooming?: A Little Help from another person toileting, which includes using toliet, bedpan, or urinal?: Total Help from another person bathing (including washing, rinsing, drying)?: A Lot Help from another person to put on and taking off regular upper body clothing?: A Lot Help from another person to put on and taking off  regular lower body clothing?: Total 6 Click Score: 12   End of Session Nurse Communication: Mobility status  Activity Tolerance: Patient limited by pain Patient left: in bed;with call bell/phone within reach;with bed alarm set;with nursing/sitter in room  OT Visit Diagnosis: Unsteadiness on feet (R26.81);Other abnormalities of gait and mobility (R26.89);Muscle weakness (generalized) (M62.81);Pain Pain - part of body:  (back, bottom)                Time: 6045-4098 OT Time Calculation (min): 32 min Charges:  OT General Charges $OT Visit: 1 Visit OT Evaluation $OT Eval Moderate Complexity: 1 Mod OT Treatments $Self Care/Home Management : 8-22 mins  [address] OTR/L   Scherry Curtis 01/16/2024, 3:42 PM

## 2024-01-16 NOTE — Progress Notes (Signed)
 PROGRESS NOTE    [name]  FAO:[id number] DOB: 10/28/1950 DOA: 01/14/2024 PCP: Rosslyn Coons, MD   Brief Narrative:  Xazier Kochan. is a 73 y.o. 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  As per the patient and patient's wife patient has been having this rash for almost 2 to 3 months while he was in the rehab after his lower extremity surgery.  Last few days the rash has acutely worsened with increasing pain and discharge and patient has been having subjective feeling of fever chills.    ED Course: In the ER patient had a fever of 106 F recorded initially but subsequent which it was 99 degrees.  Labs show WBC count of 10.6 sodium 133 blood glucose 296.  CT abdomen pelvis was done which does not show any deep involvement but does show some constipation and stool burden.  Given the symptoms concerning for cellulitis started on empiric antibiotics after cultures obtained.  **Interim History Now adding Antifungals and Topical Medications.  Likely this this is cellulitis we will stop his antibiotics and monitor off.  Assessment and Plan:  Fever with Rash: Mainly involving the lower half of the back and buttock area with pain and slight oozing concerning primarily for cellulitis.  Among other differentials include contact dermatitis vs Fungal infection. Patient states he has been having this rash for almost 2 to 3 months which has acutely worsened. Will initiate Topicals Tx with Triamcinolone :Eucerin 1:1 TID and add Fluconazole  IV. Continued Empiric Abx with IV Vanc and Cefepime  but unlikely Cellulitis so will monitor  off Abx and D/C for now. Will need Hypoallergenic Bedsheets. C/w Hydroxyzine  25 mg po TIDprn Itching. Follow Blood Cx x2 and if not improving will need Fungal Cx. WOC to evaluate and will add low loss air mattress.   Uncontrolled Diabetes Mellitus Type 2:  Last hemoglobin A1c was 8.2 today. Increase Semglee  20 units sq at bedtime to 30 units, Sensitive Novolog  SSI AC, and add 6 units of Novolog  TIDwm. CTM CBGs per Protocol. CBGs ranging from 219-329.  Will need tighter blood sugar control  CAD status post PCI: Denies any chest pain. Continue Aspirin  81 mg and Atorvastatin  10 mg po 2x Weekly   Chronic Indwelling Foley Catheter and BPH: Recently admitted for sepsis secondary UTI. Now back on [employer] Abx as above.  Hyponatremia: Na+ went from 133 -> 131 -> 136. LR @ 100 mL/hr now stopped. Repeat CMP in the AM  Normocyctic Anemia: Chronic; Likely Hemoconcentrated on Admission. Hgb/Hct went from 13.2/40.9 -> 10.7/33.1 -> 10.6/33.3 -> 9.8/28.1. Anemia Panel was checked and showed an iron level of 31, UIBC 131, TIBC 162, saturation ratios 19%, ferritin level 223, folate level 13.1 and vitamin B12 level 264. CTM for S/Sx of Bleeding; no overt bleeding noted. Repeat CBC in the AM   Hx of Orthostatic Hypotension: BP is around normal at this time but will closely observe.  Used to be on Midodrine  and Fludrocortisone  which patient has not been taking. C/w IVF with LR at 100 mL/hr x 1 Day. PT/OT recommending SNF; Check Orthostatic VS in the AM.  Constipation: With stool burden noted on  CT Scan as well as dilated stool-filled rectum similar to prior study which may indicate chronic fecal impaction with no proximal obstruction; Enema ordered by Admitting. Will place on Bowel regimen with Senna-Docusate 1 tab po BID, Miralax  17 gram po BID, and Bisacodyl  10 mg RC Dailyprn Morderate Constipation  Hypoalbuminemia: Patient's Albumin  trending down and went from 3.6 -> 2.3. CTM and Trend and repeat CMP in the AM     DVT prophylaxis: enoxaparin  (LOVENOX ) injection 40 mg Start: 01/15/24 1000    Code Status: Full Code Family Communication: No family present at bedside  Disposition Plan:  Level of care: Telemetry Medical Status is: Inpatient Remains inpatient appropriate because: PT OT recommending SNF and will need further improvement in his back   Consultants:  None  Procedures:  As needed as above  Antimicrobials:  Anti-infectives (From admission, onward)    Start     Dose/Rate Route Frequency Ordered Stop   01/16/24 1115  fluconazole  (DIFLUCAN ) IVPB 200 mg       Placed in "Followed by" Linked Group   200 mg 100 mL/hr over 60 Minutes Intravenous Every 24 hours 01/15/24 1029     01/15/24 1115  fluconazole  (DIFLUCAN ) IVPB 200 mg  Status:  Discontinued        200 mg 100 mL/hr over 60 Minutes Intravenous Every 24 hours 01/15/24 1019 01/15/24 1029   01/15/24 1115  fluconazole  (DIFLUCAN ) IVPB 400 mg       Placed in "Followed by" Linked Group   400 mg 100 mL/hr over 120 Minutes Intravenous  Once 01/15/24 1029 01/15/24 1348   01/15/24 0600  Vancomycin  (VANCOCIN ) 1,250 mg in sodium chloride  0.9 % 250 mL IVPB  Status:  Discontinued        1,250 mg 166.7 mL/hr over 90 Minutes Intravenous Every 12 hours 01/14/24 2334 01/16/24 1912   01/15/24 0200  ceFEPIme  (MAXIPIME ) 2 g in sodium chloride  0.9 % 100 mL IVPB  Status:  Discontinued        2 g 200 mL/hr over 30 Minutes Intravenous Every 8 hours 01/14/24 2334 01/16/24 1912   01/14/24 1715  vancomycin  (VANCOCIN ) IVPB 1000 mg/200 mL premix  Status:  Discontinued        1,000 mg 200 mL/hr over 60 Minutes Intravenous  Once 01/14/24 1700 01/14/24 1703   01/14/24 1715  ceFEPIme  (MAXIPIME ) 2 g in sodium chloride  0.9 % 100 mL IVPB        2 g 200 mL/hr over 30 Minutes Intravenous  Once 01/14/24 1700 01/14/24 1910   01/14/24 1715  vancomycin  (VANCOREADY) IVPB 1500 mg/300 mL        1,500 mg 150 mL/hr over 120 Minutes Intravenous  Once 01/14/24 1703  01/14/24 2037       Subjective: Seen and examined at bedside and thinks he is doing a bit better and states that the Atarax  helps.  States he has not been scratching but thinks it spread to his buttocks now.  Having difficulty turning.  No nausea or vomiting.  No lightheadedness or dizziness.  No other concerns or complaints at this time.  Objective: Vitals:   01/16/24 0851 01/16/24 0900 01/16/24 1000 01/16/24 1702  BP: 127/70   (!) 155/98  Pulse: 83   97  Resp: 18 14 18    Temp: 98.2 F (36.8 C)   98.6 F (37 C)  TempSrc: Oral   Oral  SpO2: 94%   99%  Weight:      Height:  Intake/Output Summary (Last 24 hours) at 01/16/2024 1917 Last data filed at 01/16/2024 1839 Gross per 24 hour  Intake 2722.99 ml  Output 7110 ml  Net -4387.01 ml   Filed Weights   01/14/24 1654  Weight: 81.6 kg   Examination: Physical Exam:  Constitutional: Chronically ill-appearing Caucasian male who appears a bit more comfortable compared to yesterday Respiratory: Diminished to auscultation bilaterally, no wheezing, rales, rhonchi or crackles. Normal respiratory effort and patient is not tachypenic. No accessory muscle use.  Unlabored breathing Cardiovascular: RRR, no murmurs / rubs / gallops. S1 and S2 auscultated. No extremity edema..  Abdomen: Soft, non-tender, non-distended. Bowel sounds positive.  GU: Deferred. Musculoskeletal: The left AKA Skin: Has a erythematous rash on the lower back and sacrum along with the buttock now with some mild drainage from mild bleeding and serosanguineous weeping Neurologic: CN 2-12 grossly intact with no focal deficits. Romberg sign and cerebellar reflexes not assessed.  Psychiatric: Normal judgment and insight. Alert and oriented x 3.  He is calmer today compared to yesterday  Data Reviewed: I have personally reviewed following labs and imaging studies  CBC: Recent Labs  Lab 01/14/24 1754 01/15/24 0001 01/15/24 0430 01/16/24 0530  WBC 10.6* 8.0 6.1  4.0  NEUTROABS 8.5*  --   --  2.3  HGB 13.2 10.7* 10.6* 9.8*  HCT 40.9 33.1* 33.3* 29.1*  MCV 93.0 93.5 94.6 91.8  PLT 299 244 226 228   Basic Metabolic Panel: Recent Labs  Lab 01/14/24 1754 01/15/24 0001 01/15/24 0428 01/15/24 0430 01/16/24 0530  NA 133*  --   --  131* 136  K 3.9  --   --  3.5 4.2  CL 96*  --   --  98 102  CO2 24  --   --  22 26  GLUCOSE 296*  --   --  329* 321*  BUN 13  --   --  14 11  CREATININE 0.74 0.77  --  0.74 0.75  CALCIUM  9.0  --   --  8.7* 8.8*  MG  --   --  1.9  --  2.0  PHOS  --   --  3.3  --  3.4   GFR: Estimated Creatinine Clearance: 94.9 mL/min (by C-G formula based on SCr of 0.75 mg/dL). Liver Function Tests: Recent Labs  Lab 01/15/24 0430 01/16/24 0530  AST 12* 11*  ALT 10 12  ALKPHOS 64 57  BILITOT 0.9 0.6  PROT 5.9* 5.9*  ALBUMIN  2.5* 2.3*   No results for input(s): "LIPASE", "AMYLASE" in the last 168 hours. No results for input(s): "AMMONIA" in the last 168 hours. Coagulation Profile: No results for input(s): "INR", "PROTIME" in the last 168 hours. Cardiac Enzymes: No results for input(s): "CKTOTAL", "CKMB", "CKMBINDEX", "TROPONINI" in the last 168 hours. BNP (last 3 results) No results for input(s): "PROBNP" in the last 8760 hours. HbA1C: Recent Labs    01/15/24 0001  HGBA1C 8.2*   CBG: Recent Labs  Lab 01/15/24 1641 01/15/24 2047 01/16/24 0751 01/16/24 1129 01/16/24 1628  GLUCAP 219* 269* 329* 233* 282*   Lipid Profile: No results for input(s): "CHOL", "HDL", "LDLCALC", "TRIG", "CHOLHDL", "LDLDIRECT" in the last 72 hours. Thyroid  Function Tests: No results for input(s): "TSH", "T4TOTAL", "FREET4", "T3FREE", "THYROIDAB" in the last 72 hours. Anemia Panel: Recent Labs    01/15/24 0430  VITAMINB12 264  FOLATE 13.1  FERRITIN 223  TIBC 162*  IRON 31*  RETICCTPCT 1.2   Sepsis Labs: Recent Labs  Lab  01/14/24 1754 01/15/24 0849  LATICACIDVEN 1.9 1.3   Recent Results (from the past 240 hours)  Blood  culture (routine x 2)     Status: None (Preliminary result)   Collection Time: 01/14/24  5:54 PM   Specimen: BLOOD LEFT FOREARM  Result Value Ref Range Status   Specimen Description BLOOD LEFT FOREARM  Final   Special Requests   Final    BOTTLES DRAWN AEROBIC AND ANAEROBIC Blood Culture results may not be optimal due to an inadequate volume of blood received in culture bottles   Culture   Final    NO GROWTH 2 DAYS Performed at [facility], [address] [place], [place] [address]    Report Status PENDING  Incomplete  Blood culture (routine x 2)     Status: None (Preliminary result)   Collection Time: 01/15/24 12:01 AM   Specimen: BLOOD RIGHT HAND  Result Value Ref Range Status   Specimen Description BLOOD RIGHT HAND  Final   Special Requests   Final    BOTTLES DRAWN AEROBIC AND ANAEROBIC Blood Culture adequate volume   Culture   Final    NO GROWTH 1 DAY Performed at [facility], [address] [place], [place] [address]    Report Status PENDING  Incomplete    Radiology Studies: CT ABDOMEN PELVIS W CONTRAST Result Date: 01/14/2024 CLINICAL DATA:  Sepsis. Evaluate for necrotizing infection in the groin and back. EXAM: CT ABDOMEN AND PELVIS WITH CONTRAST TECHNIQUE: Multidetector CT imaging of the abdomen and pelvis was performed using the standard protocol following bolus administration of intravenous contrast. RADIATION DOSE REDUCTION: This exam was performed according to the departmental dose-optimization program which includes automated exposure control, adjustment of the mA and/or kV according to patient size and/or use of iterative reconstruction technique. CONTRAST:  75mL OMNIPAQUE  IOHEXOL  350 MG/ML SOLN COMPARISON:  12/02/2023 FINDINGS: Lower chest: Emphysematous changes and scarring in the lung bases. Mild traction bronchiectasis. Hepatobiliary: No focal liver abnormality is seen. No gallstones, gallbladder wall thickening, or biliary dilatation. Pancreas:  Unremarkable. No pancreatic ductal dilatation or surrounding inflammatory changes. Spleen: Normal in size without focal abnormality. Adrenals/Urinary Tract: Adrenal glands are unremarkable. Kidneys are normal, without renal calculi, focal lesion, or hydronephrosis. Bladder is decompressed with a Foley catheter. Stomach/Bowel: Stomach, small bowel, and colon are not abnormally distended. No wall thickening or inflammatory changes. Colon is diffusely stool-filled. Stool-filled rectum with dilatation to 8.3 cm diameter. This is similar to prior study and could represent chronic fecal impaction. No significant wall thickening. Appendix is normal. Vascular/Lymphatic: No significant vascular findings are present. No enlarged abdominal or pelvic lymph nodes. Reproductive: Prostate is unremarkable. Other: No abdominal wall hernia or abnormality. No abdominopelvic ascites. Musculoskeletal: Postoperative fixation of the right hip. Left total hip arthroplasty. Degenerative changes throughout the spine. Schmorl's nodes at the inferior endplate of L2. No acute bony abnormalities. IMPRESSION: 1. Diffusely stool-filled colon. Dilated stool-filled rectum similar to previous study may indicate chronic fecal impaction. No proximal obstruction. 2. Emphysematous changes and scarring in the lung bases with mild traction bronchiectasis. 3. Aortic atherosclerosis. 4. No loculated fluid collections. No soft tissue gas to suggest necrotizing infection. Electronically Signed   By: Boyce Byes M.D.   On: 01/14/2024 19:50   Scheduled Meds:  aspirin  EC  81 mg Oral Daily   atorvastatin   10 mg Oral Once per day on Monday Thursday   Chlorhexidine  Gluconate Cloth  6 each Topical Daily   enoxaparin  (LOVENOX ) injection  40  mg Subcutaneous Q24H   insulin  aspart  0-9 Units Subcutaneous TID WC   insulin  aspart  6 Units Subcutaneous TID WC   insulin  glargine-yfgn  30 Units Subcutaneous QHS   polyethylene glycol  17 g Oral BID    senna-docusate  1 tablet Oral BID   triamcinolone  0.1 % cream : eucerin   Topical TID   Continuous Infusions:  fluconazole  (DIFLUCAN ) IV 200 mg (01/16/24 1113)    LOS: 2 days   Aura Leeds, DO [facility] Available via Epic secure chat 7am-7pm After these hours, please refer to coverage provider listed on [email or web address] 01/16/2024, 7:17 PM

## 2024-01-16 NOTE — Evaluation (Signed)
 Physical Therapy Evaluation Patient Details Name: Micheal Moore. MRN: [id number] DOB: 1950-12-03 Today's Date: 01/16/2024  History of Present Illness  Pt is 73 yo presenting to [facility] ED on 4/29 due to worsening back rash. Pt was recently admitted to hospital secondary to catheter related UTI with sepsis. 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  Clinical Impression  Pt is presenting close to baseline since 2023. Pt currently has not yet received his prosthetic and has not been wearing shrinker socks for L residual limb. Pt went to rehab but only worked on standing 1x. He currently has a hospital bed at home that his not meeting his needs due to there is sagging in the middle and no rails. Pt would benefit from updated equipment, shrinker sock and prosthetic training and functional mobility training at W/C level in order to increase independence, mobility to improve skin integrity. Due to pt current functional status, home set up and available assistance at home recommending skilled physical therapy services < 3 hours/day in order to address strength, balance and functional mobility to decrease risk for falls, injury, immobility, skin break down and re-hospitalization.          If plan is discharge home, recommend the following: Two people to help with walking and/or transfers;Assistance with cooking/housework;Assist for transportation;Help with stairs or ramp for entrance   Can travel by private vehicle   No    Equipment Recommendations Hoyer lift;Hospital bed (pt has hospital bed which is not adequate for needs)     Functional Status Assessment Patient has had a recent decline in their functional status and/or demonstrates limited ability to make significant improvements in function in a reasonable and predictable amount of time     Precautions / Restrictions Precautions Precautions: Fall;Other  (comment) (skin) Recall of Precautions/Restrictions: Impaired Restrictions Weight Bearing Restrictions Per Provider Order: No      Mobility  Bed Mobility Overal bed mobility: Needs Assistance Bed Mobility: Supine to Sit, Sit to Supine     Supine to sit: Mod assist Sit to supine: Min assist   General bed mobility comments: Mod A at trunk and verbal cues for sequencing to roll and then push up into sitting to decrease shearing force on the back and buttocks due to pain. Min A for scooting and getting RLE back into bed for sitting to supine.    Transfers   General transfer comment: Unable at this time due to weakness.      Balance Overall balance assessment: Needs assistance   Sitting balance-[name]: Good Sitting balance - Comments: pt able to sit EOB no issues   Standing balance support: Single extremity supported   Standing balance comment: unable to get to standing due to weakness       Pertinent Vitals/Pain Pain Assessment Pain Assessment: Faces Faces Pain Scale: Hurts whole lot Pain Location: back and buttocks with movement Pain Descriptors / Indicators: Burning Pain Intervention(s): Monitored during session, Limited activity within patient's tolerance    Home Living Family/patient expects to be discharged to:: Private residence Living Arrangements: Spouse/significant other Available Help at Discharge: Family;Available PRN/intermittently (spouse and son) Type of Home: House Home Access: Stairs to enter Entrance Stairs-Rails: None Entrance Stairs-Number of Steps: 2 to the poarch and one from porch into home   Home Layout: One level Home Equipment: Agricultural consultant (2 wheels);Wheelchair - manual;Hospital bed Additional Comments: pt states his hospital bed is messed  up and sagging in the middle. He has no rails which makes it impossible for him to get to the edge of bed.    Prior Function Prior Level of Function : Needs assist             Mobility  Comments: Pt has a prosthetic at [employer] he has not picked up and has not been wearing his shrinker socks. ADLs Comments: assists at bed level, wife completes most of his care     Extremity/Trunk Assessment   Upper Extremity Assessment Upper Extremity Assessment: Defer to OT evaluation    Lower Extremity Assessment Lower Extremity Assessment: Generalized weakness;LLE deficits/detail LLE Deficits / Details: L AKA    Cervical / Trunk Assessment Cervical / Trunk Assessment: Normal  Communication   Communication Communication: No apparent difficulties    Cognition Arousal: Alert Behavior During Therapy: WFL for tasks assessed/performed   PT - Cognitive impairments: No apparent impairments     Following commands: Intact       Cueing Cueing Techniques: Verbal cues     General Comments General comments (skin integrity, edema, etc.): Redness, sanguinous drainage at the back from lower thoracic to sacrum        Assessment/Plan    PT Assessment Patient needs continued PT services  PT Problem List Decreased skin integrity;Decreased activity tolerance;Decreased strength;Decreased mobility       PT Treatment Interventions DME instruction;Therapeutic exercise;Balance training;Functional mobility training;Therapeutic activities;Patient/family education;Gait training;Wheelchair mobility training    PT Goals (Current goals can be found in the Care Plan section)  Acute Rehab PT Goals Patient Stated Goal: Get a better hospital bed and get prosthetic PT Goal Formulation: With patient Time For Goal Achievement: 01/30/24 Potential to Achieve Goals: Fair    Frequency Min 2X/week        AM-PAC PT "6 Clicks" Mobility  Outcome Measure Help needed turning from your back to your side while in a flat bed without using bedrails?: A Lot Help needed moving from lying on your back to sitting on the side of a flat bed without using bedrails?: A Lot Help needed moving to and from a bed  to a chair (including a wheelchair)?: Total Help needed standing up from a chair using your arms (e.g., wheelchair or bedside chair)?: Total Help needed to walk in hospital room?: Total Help needed climbing 3-5 steps with a railing? : Total 6 Click Score: 8    End of Session   Activity Tolerance: Patient tolerated treatment well Patient left: in bed;with bed alarm set;with call bell/phone within reach Nurse Communication: Mobility status PT Visit Diagnosis: Other abnormalities of gait and mobility (R26.89)    Time: 3086-5784 PT Time Calculation (min) (ACUTE ONLY): 33 min   Charges:   PT Evaluation $PT Eval Low Complexity: 1 Low PT Treatments $Therapeutic Activity: 8-22 mins PT General Charges $$ ACUTE PT VISIT: 1 Visit         Sloan Duncans, DPT, CLT  Acute Rehabilitation Services Office: [phone] (Secure chat preferred)   Jenice Mitts 01/16/2024, 10:25 AM

## 2024-01-16 NOTE — Inpatient Diabetes Management (Signed)
 Inpatient Diabetes Program Recommendations  AACE/ADA: New Consensus Statement on Inpatient Glycemic Control (2015)  Target Ranges:  Prepandial:   less than 140 mg/dL      Peak postprandial:   less than 180 mg/dL (1-2 hours)      Critically ill patients:  140 - 180 mg/dL   Lab Results  Component Value Date   GLUCAP 329 (H) 01/16/2024   HGBA1C 8.2 (H) 01/15/2024    Review of Glycemic Control  Latest Reference Range & Units 01/15/24 11:17 01/15/24 16:41 01/15/24 20:47 01/16/24 07:51  Glucose-Capillary 70 - 99 mg/dL 161 (H) 096 (H) 045 (H) 329 (H)  Diabetes history: DM  Outpatient Diabetes medications:  Novolog  8 units tid with meals  tid with meals Levemir  20 units daily Current orders for Inpatient glycemic control: Novolog  0-9 units tid with meals Semglee  20 units daily Novolog  6 units tid with meals   Inpatient Diabetes Program Recommendations:    Please consider increasing Semglee  to 30 units daily.   Thanks,  Josefa Ni, RN, BC-ADM Inpatient Diabetes Coordinator Pager [phone]  (8a-5p)

## 2024-01-16 NOTE — TOC Initial Note (Signed)
 Transition of Care (TOC) - Initial/Assessment Note    Patient Details  Name: Micheal Moore. MRN: [id number] Date of Birth: Feb 23, 1951  Transition of Care [facility]) CM/SW Contact:    [name], [place] Phone Number: 01/16/2024, 1:24 PM  Clinical Narrative:  Spoke with pt re PT recommendation for SNF. Pt with prior SNF stays at [facility] and [facility], does not want to return to either facility. Reviewed SNF placement process and answered questions. Pt requested SW f/u with his wife to discuss SNF and facility choice. Spoke to pt's wife [name] who confirms agreeable to SNF for STR and is requesting [facility]. Will f/u with offers as available.   Paullette Boston, MSW, LCSW [phone] (coverage)     Expected Discharge Plan: Skilled Nursing Facility Barriers to Discharge: Continued Medical Work up, SNF Pending bed offer, Insurance Authorization   Patient Goals and CMS Choice   CMS Medicare.gov Compare Post Acute Care list provided to:: Patient Choice offered to / list presented to : Patient [place] ownership interest in [facility].provided to:: Patient    Expected Discharge Plan and Services     Post Acute Care Choice: Skilled Nursing Facility Living arrangements for the past 2 months: Single Family Home                                      Prior Living Arrangements/Services Living arrangements for the past 2 months: Single Family Home Lives with:: Spouse Patient language and need for interpreter reviewed:: Yes        Need for Family Participation in Patient Care: Yes (Comment) Care giver support system in place?: Yes (comment)   Criminal Activity/Legal Involvement Pertinent to Current Situation/Hospitalization: No - Comment as needed  Activities of Daily Living   ADL Screening (condition at time of admission) Independently performs ADLs?: No (L AKA) Does the patient have a NEW difficulty with  bathing/dressing/toileting/self-feeding that is expected to last >3 days?: No Does the patient have a NEW difficulty with getting in/out of bed, walking, or climbing stairs that is expected to last >3 days?: No Does the patient have a NEW difficulty with communication that is expected to last >3 days?: No Is the patient deaf or have difficulty hearing?: No Does the patient have difficulty seeing, even when wearing glasses/contacts?: No Does the patient have difficulty concentrating, remembering, or making decisions?: No  Permission Sought/Granted Permission sought to share information with : Facility Industrial/product designer granted to share information with : Yes, Verbal Permission Granted              Emotional Assessment       Orientation: : Oriented to Self, Oriented to Place, Oriented to  Time, Oriented to Situation Alcohol  / Substance Use: Not Applicable Psych Involvement: No (comment)  Admission diagnosis:  Cellulitis [L03.90] Cellulitis of back except buttock [L03.312] Patient Active Problem List   Diagnosis Date Noted   Cellulitis 01/14/2024   Chronic indwelling Foley catheter 01/14/2024   Sepsis secondary to UTI (HCC) 12/26/2023   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   MRSA infection 06/24/2023   History of MDR Pseudomonas aeruginosa infection 06/24/2023   Ambulatory dysfunction 05/01/2023   Weakness 04/24/2023   Medication management 04/16/2023   Bacteremia 04/12/2023   Aspiration pneumonia (HCC) 04/11/2023   Pressure injury of skin 04/11/2023   Nausea and vomiting 04/11/2023   CAD (coronary artery disease) 04/11/2023   PVD (  peripheral vascular disease) (HCC) 04/11/2023   Debility 04/11/2023   Hyperglycemia 01/16/2023   STEMI (ST elevation myocardial infarction) (HCC) 01/15/2023   Anterior dislocation of left hip (HCC) 08/24/2022   Closed dislocation of left hip (HCC) 08/23/2022   Normocytic anemia 08/23/2022   Protein-calorie malnutrition, moderate  (HCC) 07/20/2022   Orthostatic hypotension    Hyperlipidemia 02/22/2022   Chronic diastolic CHF (congestive heart failure) (HCC) 02/22/2022   Hx of left BKA (HCC) 05/06/2020   Type 2 diabetes mellitus with hyperlipidemia (HCC) 04/27/2020   History of DVT (deep vein thrombosis) 04/27/2020   Tobacco abuse 04/27/2020   PCP:  Rosslyn Coons, MD Pharmacy:   [facility] [address] ([place]), [place] - [address] [place] ([place]) [place] [address] Phone: [phone] Fax: [phone]  [facility] [address] [place] [address] Phone: [phone] Fax: [phone]     Social Drivers of Health (SDOH) Social History: SDOH Screenings   Food Insecurity: No Food Insecurity (01/15/2024)  Housing: Low Risk  (01/15/2024)  Transportation Needs: No Transportation Needs (01/15/2024)  Utilities: Not At Risk (01/15/2024)  Social Connections: Socially Isolated (01/15/2024)  Tobacco Use: High Risk (01/14/2024)   SDOH Interventions:     Readmission Risk Interventions    06/24/2023    3:31 PM 04/25/2023    1:04 PM 04/12/2023    4:47 PM  Readmission Risk Prevention Plan  Transportation Screening Complete Complete Complete  PCP or Specialist Appt within 3-5 Days  Complete Complete  HRI or Home Care Consult  Complete Complete  Social Work Consult for Recovery Care Planning/Counseling  Complete Complete  Palliative Care Screening  Complete Not Applicable  Medication Review Oceanographer) Complete Complete Referral to Pharmacy  PCP or Specialist appointment within 3-5 days of discharge Complete    HRI or Home Care Consult Complete    SW Recovery Care/Counseling Consult Complete    Palliative Care Screening Complete    Skilled Nursing Facility Complete

## 2024-01-16 NOTE — NC FL2 (Signed)
 [place]  MEDICAID FL2 LEVEL OF CARE FORM     IDENTIFICATION  Patient Name: Micheal Moore. Birthdate: 03-15-1951 Sex: male Admission Date (Current Location): 01/14/2024  [facility] and [place] Number:  Producer, television/film/video and Address:  [facility] [place], 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   MRSA infection 06/24/2023   History of MDR Pseudomonas aeruginosa infection 06/24/2023   Ambulatory dysfunction 05/01/2023   Weakness 04/24/2023   Medication management 04/16/2023   Bacteremia 04/12/2023   Aspiration pneumonia (HCC) 04/11/2023   Pressure injury of skin 04/11/2023   Nausea and vomiting 04/11/2023   CAD (coronary artery disease) 04/11/2023   PVD (peripheral vascular disease) (HCC) 04/11/2023   Debility 04/11/2023   Hyperglycemia 01/16/2023   STEMI (ST elevation myocardial infarction) (HCC) 01/15/2023   Anterior dislocation of left hip (HCC) 08/24/2022   Closed dislocation of left hip (HCC) 08/23/2022   Normocytic anemia 08/23/2022   Protein-calorie malnutrition, moderate (HCC) 07/20/2022   Orthostatic hypotension    Hyperlipidemia 02/22/2022   Chronic diastolic CHF (congestive heart failure) (HCC) 02/22/2022   Hx of left BKA (HCC) 05/06/2020   Type 2 diabetes mellitus with hyperlipidemia (HCC) 04/27/2020   History of DVT (deep vein thrombosis) 04/27/2020   Tobacco abuse 04/27/2020     Orientation RESPIRATION BLADDER Height & Weight     Self, Time, Situation, Place  Normal Continent Weight: 180 lb (81.6 kg) Height:  6\' 6"  (198.1 cm)  BEHAVIORAL SYMPTOMS/MOOD NEUROLOGICAL BOWEL NUTRITION STATUS      Continent    AMBULATORY STATUS COMMUNICATION OF NEEDS Skin   Extensive Assist Verbally Normal                       Personal Care Assistance Level of Assistance  Bathing, Feeding, Dressing Bathing Assistance: Limited assistance Feeding assistance: Limited assistance Dressing Assistance: Limited assistance     Functional Limitations Info  Sight, Hearing, Speech Sight Info: Adequate Hearing Info: Adequate Speech Info: Adequate    SPECIAL CARE FACTORS FREQUENCY  PT (By licensed PT), OT (By licensed OT)                    Contractures Contractures Info: Not present    Additional Factors Info  Code Status Code Status Info: FULL CODE             Current Medications (01/16/2024):  This is the current hospital active medication list Current Facility-Administered Medications  Medication Dose Route Frequency Provider Last Rate Last Admin   acetaminophen  (TYLENOL ) tablet 650 mg  650 mg Oral Q6H PRN Angelene Kelly, MD   650 mg at 01/15/24 5621   Or   acetaminophen  (TYLENOL ) suppository 650 mg  650 mg Rectal Q6H PRN Angelene Kelly, MD       aspirin  EC tablet 81 mg  81 mg Oral Daily Angelene Kelly, MD   [phone]  mg at 01/16/24 0854   atorvastatin  (LIPITOR ) tablet 10 mg  10 mg Oral Once per day on Monday Thursday Angelene Kelly, MD   10 mg at 01/16/24 1610   bisacodyl  (DULCOLAX) suppository 10 mg  10 mg Rectal Daily PRN Sheikh, Omair Latif, DO       ceFEPIme  (MAXIPIME ) 2 g in sodium chloride  0.9 % 100 mL IVPB  2 g Intravenous Q8H Young Hensen, RPH 200 mL/hr at 01/16/24 0901 2 g at 01/16/24 0901   Chlorhexidine  Gluconate Cloth 2 % PADS 6 each  6 each Topical Daily Sheikh, Omair [place], DO   6 each at 01/16/24 9604   enoxaparin  (LOVENOX )  injection 40 mg  40 mg Subcutaneous Q24H Angelene Kelly, MD   40 mg at 01/16/24 5409   fluconazole  (DIFLUCAN ) IVPB 200 mg  200 mg Intravenous Q24H Reome, Earle J, RPH 100 mL/hr at 01/16/24 1113 200 mg at 01/16/24 1113   hydrOXYzine  (ATARAX ) tablet 25 mg  25 mg Oral TID PRN Sheikh, Omair Latif, DO   25 mg at 01/15/24 1818   insulin  aspart (novoLOG ) injection 0-9 Units  0-9 Units Subcutaneous TID WC Kakrakandy, Arshad N, MD   3 Units at 01/16/24 1157   insulin  aspart (novoLOG ) injection 6 Units  6 Units Subcutaneous TID WC Sheikh, Omair Latif, DO   6 Units at 01/16/24 1158   insulin  glargine-yfgn (SEMGLEE ) injection 30 Units  30 Units Subcutaneous QHS Sheikh, Omair Latif, DO       polyethylene glycol (MIRALAX  / GLYCOLAX ) packet 17 g  17 g Oral BID Sheikh, Omair [place], DO   17 g at 01/16/24 0856   senna-docusate (Senokot-S) tablet 1 tablet  1 tablet Oral BID Sheikh, Omair Latif, DO   1 tablet at 01/16/24 8119   triamcinolone  0.1 % cream : eucerin cream, 1:1   Topical TID Sheikh, Omair Latif, DO   Given at 01/16/24 1478   Vancomycin  (VANCOCIN ) 1,250 mg in sodium chloride  0.9 % 250 mL IVPB  1,250 mg Intravenous Q12H Young Hensen, RPH 166.7 mL/hr at 01/16/24 0558 1,250 mg at 01/16/24 2956     Discharge Medications: Please see discharge summary for a list of discharge medications.  Relevant Imaging Results:  Relevant Lab Results:   Additional Information SS# [id number]  [name], [place]

## 2024-01-16 NOTE — Care Management Important Message (Signed)
 Important Message  Patient Details  Name: Micheal Moore. MRN: [id number] Date of Birth: [birth year]   Important Message Given:  Yes - Medicare IM  Patient left prior to Im delivery will mail a copy to the patient home address.    Londynn Sonoda 01/16/2024, 3:53 PM

## 2024-01-17 DIAGNOSIS — E1169 Type 2 diabetes mellitus with other specified complication: Secondary | ICD-10-CM | POA: Diagnosis not present

## 2024-01-17 DIAGNOSIS — I739 Peripheral vascular disease, unspecified: Secondary | ICD-10-CM | POA: Diagnosis not present

## 2024-01-17 DIAGNOSIS — R21 Rash and other nonspecific skin eruption: Secondary | ICD-10-CM

## 2024-01-17 DIAGNOSIS — I951 Orthostatic hypotension: Secondary | ICD-10-CM | POA: Diagnosis not present

## 2024-01-17 DIAGNOSIS — Z89512 Acquired absence of left leg below knee: Secondary | ICD-10-CM

## 2024-01-17 LAB — COMPREHENSIVE METABOLIC PANEL WITH GFR
ALT: 11 U/L (ref 0–44)
AST: 15 U/L (ref 15–41)
Albumin: 2.6 g/dL — ABNORMAL LOW (ref 3.5–5.0)
Alkaline Phosphatase: 59 U/L (ref 38–126)
Anion gap: 10 (ref 5–15)
BUN: 11 mg/dL (ref 8–23)
CO2: 25 mmol/L (ref 22–32)
Calcium: 8.9 mg/dL (ref 8.9–10.3)
Chloride: 100 mmol/L (ref 98–111)
Creatinine, Ser: 0.74 mg/dL (ref 0.61–1.24)
GFR, Estimated: >60 mL/min (ref >60)
Glucose, Bld: 337 mg/dL — ABNORMAL HIGH (ref 70–99)
Potassium: 4.8 mmol/L (ref 3.5–5.1)
Sodium: 135 mmol/L (ref 135–145)
Total Bilirubin: 0.8 mg/dL (ref 0.0–1.2)
Total Protein: 6.2 g/dL — ABNORMAL LOW (ref 6.5–8.1)

## 2024-01-17 LAB — CBC WITH DIFFERENTIAL/PLATELET
Abs Immature Granulocytes: 0.06 10*3/uL (ref 0.00–0.07)
Basophils Absolute: 0 10*3/uL (ref 0.0–0.1)
Basophils Relative: 1 %
Eosinophils Absolute: 0.3 10*3/uL (ref 0.0–0.5)
Eosinophils Relative: 5 %
HCT: 33 % — ABNORMAL LOW (ref 39.0–52.0)
Hemoglobin: 10.8 g/dL — ABNORMAL LOW (ref 13.0–17.0)
Immature Granulocytes: 1 %
Lymphocytes Relative: 30 %
Lymphs Abs: 1.5 10*3/uL (ref 0.7–4.0)
MCH: 30.2 pg (ref 26.0–34.0)
MCHC: 32.7 g/dL (ref 30.0–36.0)
MCV: 92.2 fL (ref 80.0–100.0)
Monocytes Absolute: 0.4 10*3/uL (ref 0.1–1.0)
Monocytes Relative: 7 %
Neutro Abs: 2.8 10*3/uL (ref 1.7–7.7)
Neutrophils Relative %: 56 %
Platelets: 221 10*3/uL (ref 150–400)
RBC: 3.58 MIL/uL — ABNORMAL LOW (ref 4.22–5.81)
RDW: 14.4 % (ref 11.5–15.5)
WBC: 5 10*3/uL (ref 4.0–10.5)
nRBC: 0 % (ref 0.0–0.2)

## 2024-01-17 LAB — GLUCOSE, CAPILLARY
Glucose-Capillary: 340 mg/dL — ABNORMAL HIGH (ref 70–99)
Glucose-Capillary: 216 mg/dL — ABNORMAL HIGH (ref 70–99)
Glucose-Capillary: 274 mg/dL — ABNORMAL HIGH (ref 70–99)
Glucose-Capillary: 274 mg/dL — ABNORMAL HIGH (ref 70–99)

## 2024-01-17 LAB — MAGNESIUM: Magnesium: 2 mg/dL (ref 1.7–2.4)

## 2024-01-17 LAB — PHOSPHORUS: Phosphorus: 3.7 mg/dL (ref 2.5–4.6)

## 2024-01-17 MED ORDER — CLOTRIMAZOLE 1 % EX CREA
TOPICAL_CREAM | Freq: Two times a day (BID) | CUTANEOUS | Status: DC
  Filled 2024-01-17 (×3): qty 15

## 2024-01-17 MED ORDER — INSULIN ASPART 100 UNIT/ML IJ SOLN
8.0000 [IU] | Freq: Three times a day (TID) | INTRAMUSCULAR | Status: DC
  Administered 2024-01-17 – 2024-01-18 (×4): 8 [IU] via SUBCUTANEOUS

## 2024-01-17 MED ORDER — CLOTRIMAZOLE 1 % EX SOLN
Freq: Two times a day (BID) | CUTANEOUS | Status: DC
  Filled 2024-01-17: qty 10

## 2024-01-17 MED ORDER — INSULIN GLARGINE-YFGN 100 UNIT/ML ~~LOC~~ SOLN
40.0000 [IU] | Freq: Every day | SUBCUTANEOUS | Status: DC
  Administered 2024-01-17: 40 [IU] via SUBCUTANEOUS
  Filled 2024-01-17 (×2): qty 0.4

## 2024-01-17 NOTE — Inpatient Diabetes Management (Signed)
 Inpatient Diabetes Program Recommendations  AACE/ADA: New Consensus Statement on Inpatient Glycemic Control (2015)  Target Ranges:  Prepandial:   less than 140 mg/dL      Peak postprandial:   less than 180 mg/dL (1-2 hours)      Critically ill patients:  140 - 180 mg/dL   Lab Results  Component Value Date   GLUCAP 340 (H) 01/17/2024   HGBA1C 8.2 (H) 01/15/2024    Review of Glycemic Control  Latest Reference Range & Units 01/15/24 20:47 01/16/24 07:51 01/16/24 11:29 01/16/24 16:28 01/16/24 19:18 01/17/24 07:43  Glucose-Capillary 70 - 99 mg/dL 540 (H) 981 (H) 191 (H) 282 (H) 265 (H) 340 (H)   Diabetes history: DM 2 Outpatient Diabetes medications:  Novolog  8 units tid with meals  tid with meals Levemir  20 units daily Current orders for Inpatient glycemic control: Novolog  0-9 units tid with meals Semglee  30 units daily Novolog  6 units tid with meals   Inpatient Diabetes Program Recommendations:    Consider increasing Semglee  to 40 units daily.  Also consider increasing Novolog  to 8 units tid with meals.   Thanks,  Josefa Ni, RN, BC-ADM Inpatient Diabetes Coordinator Pager [phone]  (8a-5p)

## 2024-01-17 NOTE — Progress Notes (Signed)
 PROGRESS NOTE    Micheal Moore.  YQI:[id number] DOB: [birth year] DOA: 01/14/2024 PCP: Rosslyn Coons, MD   Brief Narrative:  Micheal Tramell. is a 73 y.o. 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  As per the patient and patient's wife patient has been having this rash for almost 2 to 3 months while he was in the rehab after his lower extremity surgery.  Last few days the rash has acutely worsened with increasing pain and discharge and patient has been having subjective feeling of fever chills.    ED Course: In the ER patient had a fever of 106 F recorded initially but subsequent which it was 99 degrees.  Labs show WBC count of 10.6 sodium 133 blood glucose 296.  CT abdomen pelvis was done which does not show any deep involvement but does show some constipation and stool burden.  Given the symptoms concerning for cellulitis started on empiric antibiotics after cultures obtained.  **Interim History Now adding Antifungals and Topical Medications being changed.  Likely this this is cellulitis we will stop his antibiotics and monitor off.  Assessment and Plan:  Fever with Rash: Mainly involving the lower half of the back and buttock area with pain and slight oozing concerning primarily for cellulitis.  Among other differentials include contact dermatitis vs Fungal infection. Patient states he has been having this rash for almost 2 to 3 months which has acutely worsened. Changed Topicals Tx Triamcinolone :Eucerin 1:1 TID to Clotrimazole  1%. C/w Fluconazole  IV. Continued Empiric Abx with IV Vanc and Cefepime  but unlikely  Cellulitis so will monitor off Abx and D/C for now. Will need Hypoallergenic Bedsheets. C/w Hydroxyzine  25 mg po TIDprn Itching. Blood Cx x2 showing NGTD @ 2 Days and if not improving will need Fungal Cx. WOC to evaluate and will add low loss air mattress. PT/OT recommend SNF.   Uncontrolled Diabetes Mellitus Type 2:  Last hemoglobin A1c was 8.2 . Increased Semglee  to 40 units sq Daily, Sensitive Novolog  SSI AC, and increased 6 units of Novolog  TIDwm to 8 units. CTM CBGs per Protocol. CBGs ranging from 216-340.  Will need tighter blood sugar control  CAD status post PCI: Denies any chest pain. Continue Aspirin  81 mg and Atorvastatin  10 mg po 2x Weekly   Chronic Indwelling Foley Catheter and BPH: Recently admitted for sepsis secondary UTI. Now back on [employer] Abx as above.  Hyponatremia: Na+ went from 133 -> 131 -> 136 -> 135. LR @ 100 mL/hr now stopped. Repeat CMP in the AM  Normocyctic Anemia: Chronic; Likely Hemoconcentrated on Admission. Hgb/Hct went from 13.2/40.9 -> 10.7/33.1 -> 10.6/33.3 -> 9.8/28.1 -> 10.8/33.0. Anemia Panel was checked and showed an iron level of 31, UIBC 131, TIBC 162, saturation ratios 19%, ferritin level 223, folate level 13.1 and vitamin B12 level 264. CTM for S/Sx of Bleeding; no overt bleeding noted. Repeat CBC in the AM   Hx of Orthostatic Hypotension: BP is around normal at this time but will closely observe.  Used to be on Midodrine  and Fludrocortisone  which patient has not been taking. C/w IVF with LR at 100 mL/hr x 1 Day. PT/OT recommending SNF;  Check Orthostatic VS in the AM.  Constipation: With stool burden noted on CT Scan as well as dilated stool-filled rectum similar to prior study which may indicate chronic fecal impaction with no proximal obstruction; Enema ordered by Admitting. Will place on Bowel regimen with Senna-Docusate 1 tab po BID, Miralax  17 gram po BID, and Bisacodyl  10 mg RC Dailyprn Morderate Constipation  Hypoalbuminemia: Patient's  Albumin  trending down and went from 3.6 -> 2.6. CTM and Trend and repeat CMP in the AM    DVT prophylaxis: enoxaparin  (LOVENOX ) injection 40 mg Start: 01/15/24 1000    Code Status: Full Code Family Communication: No family present @ bedside  Disposition Plan:  Level of care: Telemetry Medical Status is: Inpatient Remains inpatient appropriate because: Needs further clinical improvement and fashion PT OT recommending SNF   Consultants:  None  Procedures:  As delineated as above  Antimicrobials:  Anti-infectives (From admission, onward)    Start     Dose/Rate Route Frequency Ordered Stop   01/16/24 1115  fluconazole  (DIFLUCAN ) IVPB 200 mg       Placed in "Followed by" Linked Group   200 mg 100 mL/hr over 60 Minutes Intravenous Every 24 hours 01/15/24 1029     01/15/24 1115  fluconazole  (DIFLUCAN ) IVPB 200 mg  Status:  Discontinued        200 mg 100 mL/hr over 60 Minutes Intravenous Every 24 hours 01/15/24 1019 01/15/24 1029   01/15/24 1115  fluconazole  (DIFLUCAN ) IVPB 400 mg       Placed in "Followed by" Linked Group   400 mg 100 mL/hr over 120 Minutes Intravenous  Once 01/15/24 1029 01/15/24 1348   01/15/24 0600  Vancomycin  (VANCOCIN ) 1,250 mg in sodium chloride  0.9 % 250 mL IVPB  Status:  Discontinued        1,250 mg 166.7 mL/hr over 90 Minutes Intravenous Every 12 hours 01/14/24 2334 01/16/24 1912   01/15/24 0200  ceFEPIme  (MAXIPIME ) 2 g in sodium chloride  0.9 % 100 mL IVPB  Status:  Discontinued        2 g 200 mL/hr over 30 Minutes Intravenous Every 8 hours 01/14/24 2334 01/16/24 1912   01/14/24 1715  vancomycin  (VANCOCIN ) IVPB 1000 mg/200 mL premix  Status:  Discontinued        1,000 mg 200 mL/hr over 60 Minutes Intravenous  Once 01/14/24 1700 01/14/24 1703   01/14/24 1715  ceFEPIme  (MAXIPIME ) 2 g in sodium chloride  0.9 % 100 mL IVPB        2 g 200 mL/hr over 30 Minutes Intravenous  Once 01/14/24 1700 01/14/24 1910   01/14/24 1715  vancomycin  (VANCOREADY) IVPB 1500  mg/300 mL        1,500 mg 150 mL/hr over 120 Minutes Intravenous  Once 01/14/24 1703 01/14/24 2037       Subjective: Seen and examined at bedside and thinks is doing a little bit better today.  States is not as painful.  States it is easier for him to return now.  No nausea or vomiting.  Denies any lightheadedness or dizziness.  No other concerns or complaints at this time.   Objective: Vitals:   01/16/24 2113 01/16/24 2113 01/17/24 0450 01/17/24 1638  BP: (!) 148/88 (!) 148/88 127/66 136/80  Pulse: 93 93 73 87  Resp: 17  17   Temp: 98.4 F (36.9 C) 98.4 F (36.9 C) 97.6 F (36.4 C) 98.6 F (37 C)  TempSrc: Oral Oral Oral Oral  SpO2: 99% 99% 97% 98%  Weight:  Height:        Intake/Output Summary (Last 24 hours) at 01/17/2024 1838 Last data filed at 01/17/2024 1700 Gross per 24 hour  Intake 2222.39 ml  Output 6350 ml  Net -4127.61 ml   Filed Weights   01/14/24 1654  Weight: 81.6 kg   Examination: Physical Exam:  Constitutional: Ackley ill-appearing Caucasian male who appears more comfortable Respiratory: Diminished to auscultation bilaterally, no wheezing, rales, rhonchi or crackles. Normal respiratory effort and patient is not tachypenic. No accessory muscle use.  Unlabored breathing Cardiovascular: RRR, no murmurs / rubs / gallops. S1 and S2 auscultated. No extremity edema. Abdomen: Soft, non-tender, non-distended. Bowel sounds positive.  GU: Deferred. Musculoskeletal: Has a left AKA Skin: Has an erythematous and splotchy rash on the back but is slowly improving.  Continues to have some mild drainage and serosanguineous weeping.  Buttock is also involved and has some thin skin Neurologic: CN 2-12 grossly intact with no focal deficits. Romberg sign and cerebellar reflexes not assessed.  Psychiatric: Normal judgment and insight. Alert and oriented x 3.   Data Reviewed: I have personally reviewed following labs and imaging studies  CBC: Recent Labs  Lab  01/14/24 1754 01/15/24 0001 01/15/24 0430 01/16/24 0530 01/17/24 0624  WBC 10.6* 8.0 6.1 4.0 5.0  NEUTROABS 8.5*  --   --  2.3 2.8  HGB 13.2 10.7* 10.6* 9.8* 10.8*  HCT 40.9 33.1* 33.3* 29.1* 33.0*  MCV 93.0 93.5 94.6 91.8 92.2  PLT 299 244 226 228 221   Basic Metabolic Panel: Recent Labs  Lab 01/14/24 1754 01/15/24 0001 01/15/24 0428 01/15/24 0430 01/16/24 0530 01/17/24 0624  NA 133*  --   --  131* 136 135  K 3.9  --   --  3.5 4.2 4.8  CL 96*  --   --  98 102 100  CO2 24  --   --  22 26 25   GLUCOSE 296*  --   --  329* 321* 337*  BUN 13  --   --  14 11 11   CREATININE 0.74 0.77  --  0.74 0.75 0.74  CALCIUM  9.0  --   --  8.7* 8.8* 8.9  MG  --   --  1.9  --  2.0 2.0  PHOS  --   --  3.3  --  3.4 3.7   GFR: Estimated Creatinine Clearance: 94.9 mL/min (by C-G formula based on SCr of 0.74 mg/dL). Liver Function Tests: Recent Labs  Lab 01/15/24 0430 01/16/24 0530 01/17/24 0624  AST 12* 11* 15  ALT 10 12 11   ALKPHOS 64 57 59  BILITOT 0.9 0.6 0.8  PROT 5.9* 5.9* 6.2*  ALBUMIN  2.5* 2.3* 2.6*   No results for input(s): "LIPASE", "AMYLASE" in the last 168 hours. No results for input(s): "AMMONIA" in the last 168 hours. Coagulation Profile: No results for input(s): "INR", "PROTIME" in the last 168 hours. Cardiac Enzymes: No results for input(s): "CKTOTAL", "CKMB", "CKMBINDEX", "TROPONINI" in the last 168 hours. BNP (last 3 results) No results for input(s): "PROBNP" in the last 8760 hours. HbA1C: Recent Labs    01/15/24 0001  HGBA1C 8.2*   CBG: Recent Labs  Lab 01/16/24 1628 01/16/24 1918 01/17/24 0743 01/17/24 1149 01/17/24 1634  GLUCAP 282* 265* 340* 216* 274*   Lipid Profile: No results for input(s): "CHOL", "HDL", "LDLCALC", "TRIG", "CHOLHDL", "LDLDIRECT" in the last 72 hours. Thyroid  Function Tests: No results for input(s): "TSH", "T4TOTAL", "FREET4", "T3FREE", "THYROIDAB" in the last 72 hours. Anemia Panel: Recent  Labs    01/15/24 0430   VITAMINB12 264  FOLATE 13.1  FERRITIN 223  TIBC 162*  IRON 31*  RETICCTPCT 1.2   Sepsis Labs: Recent Labs  Lab 01/14/24 1754 01/15/24 0849  LATICACIDVEN 1.9 1.3   Recent Results (from the past 240 hours)  Blood culture (routine x 2)     Status: None (Preliminary result)   Collection Time: 01/14/24  5:54 PM   Specimen: BLOOD LEFT FOREARM  Result Value Ref Range Status   Specimen Description BLOOD LEFT FOREARM  Final   Special Requests   Final    BOTTLES DRAWN AEROBIC AND ANAEROBIC Blood Culture results may not be optimal due to an inadequate volume of blood received in culture bottles   Culture   Final    NO GROWTH 3 DAYS Performed at [facility], [address], [place], [place] [address]    Report Status PENDING  Incomplete  Blood culture (routine x 2)     Status: None (Preliminary result)   Collection Time: 01/15/24 12:01 AM   Specimen: BLOOD RIGHT HAND  Result Value Ref Range Status   Specimen Description BLOOD RIGHT HAND  Final   Special Requests   Final    BOTTLES DRAWN AEROBIC AND ANAEROBIC Blood Culture adequate volume   Culture   Final    NO GROWTH 2 DAYS Performed at [facility], [address], [place], [place] [address]    Report Status PENDING  Incomplete    Radiology Studies: No results found.  Scheduled Meds:  aspirin  EC  81 mg Oral Daily   atorvastatin   10 mg Oral Once per day on Monday Thursday   Chlorhexidine  Gluconate Cloth  6 each Topical Daily   clotrimazole    Topical BID   enoxaparin  (LOVENOX ) injection  40 mg Subcutaneous Q24H   insulin  aspart  0-9 Units Subcutaneous TID WC   insulin  aspart  8 Units Subcutaneous TID WC   insulin  glargine-yfgn  40 Units Subcutaneous QHS   polyethylene glycol  17 g Oral BID   senna-docusate  1 tablet Oral BID   Continuous Infusions:  fluconazole  (DIFLUCAN ) IV 200 mg (01/17/24 0857)    LOS: 3 days   Aura Leeds, DO [facility] Available via Epic secure chat 7am-7pm After these  hours, please refer to coverage provider listed on [email or web address] 01/17/2024, 6:38 PM

## 2024-01-17 NOTE — Plan of Care (Signed)
  Problem: Metabolic: Goal: Ability to maintain appropriate glucose levels will improve Outcome: Progressing   Problem: Nutritional: Goal: Maintenance of adequate nutrition will improve Outcome: Progressing

## 2024-01-17 NOTE — TOC Progression Note (Addendum)
 Transition of Care (TOC) - Progression Note    Patient Details  Name: Micheal Moore. MRN: [id number] Date of Birth: 1951/06/11  Transition of Care [facility]) CM/SW Contact  Paullette Boston [place], [place] Phone Number: 01/17/2024, 8:46 AM  Clinical Narrative:  Bed offer received from pt/wife preferred facility [facility]. Notified pt's wife and she has accepted. Rhonda at [facility] confirmed bed and aware pt will require a low loss air mattress. CMA to start [facility] auth. SW will follow.   UPDATE 1550: [facility] [employer] offering a peer-to-peer for SNF. MD updated and provided with information to complete appeal which is also noted below.   **Deadline for peer-to-peer is Monday at 10am. Contact number is [phone], option 5. Will need pt's name, DOB, and member ID: [id number]   Paullette Boston, MSW, LCSW [phone] (coverage)      Expected Discharge Plan: Skilled Nursing Facility Barriers to Discharge: Continued Medical Work up, SNF Pending bed offer, Insurance Authorization  Expected Discharge Plan and Services     Post Acute Care Choice: Skilled Nursing Facility Living arrangements for the past 2 months: Single Family Home                                       Social Determinants of Health (SDOH) Interventions SDOH Screenings   Food Insecurity: No Food Insecurity (01/15/2024)  Housing: Low Risk  (01/15/2024)  Transportation Needs: No Transportation Needs (01/15/2024)  Utilities: Not At Risk (01/15/2024)  Social Connections: Socially Isolated (01/15/2024)  Tobacco Use: High Risk (01/14/2024)    Readmission Risk Interventions    06/24/2023    3:31 PM 04/25/2023    1:04 PM 04/12/2023    4:47 PM  Readmission Risk Prevention Plan  Transportation Screening Complete Complete Complete  PCP or Specialist Appt within 3-5 Days  Complete Complete  HRI or Home Care Consult  Complete Complete  Social Work Consult for Recovery Care  Planning/Counseling  Complete Complete  Palliative Care Screening  Complete Not Applicable  Medication Review Oceanographer) Complete Complete Referral to Pharmacy  PCP or Specialist appointment within 3-5 days of discharge Complete    HRI or Home Care Consult Complete    SW Recovery Care/Counseling Consult Complete    Palliative Care Screening Complete    Skilled Nursing Facility Complete

## 2024-01-18 DIAGNOSIS — I951 Orthostatic hypotension: Secondary | ICD-10-CM | POA: Diagnosis not present

## 2024-01-18 DIAGNOSIS — R21 Rash and other nonspecific skin eruption: Secondary | ICD-10-CM | POA: Diagnosis not present

## 2024-01-18 DIAGNOSIS — I739 Peripheral vascular disease, unspecified: Secondary | ICD-10-CM | POA: Diagnosis not present

## 2024-01-18 DIAGNOSIS — E1169 Type 2 diabetes mellitus with other specified complication: Secondary | ICD-10-CM | POA: Diagnosis not present

## 2024-01-18 LAB — CBC WITH DIFFERENTIAL/PLATELET
Abs Immature Granulocytes: 0.1 10*3/uL — ABNORMAL HIGH (ref 0.00–0.07)
Basophils Absolute: 0.1 10*3/uL (ref 0.0–0.1)
Basophils Relative: 1 %
Eosinophils Absolute: 0.1 10*3/uL (ref 0.0–0.5)
Eosinophils Relative: 1 %
HCT: 34.1 % — ABNORMAL LOW (ref 39.0–52.0)
Hemoglobin: 11.1 g/dL — ABNORMAL LOW (ref 13.0–17.0)
Lymphocytes Relative: 36 %
Lymphs Abs: 1.9 10*3/uL (ref 0.7–4.0)
MCH: 30.2 pg (ref 26.0–34.0)
MCHC: 32.6 g/dL (ref 30.0–36.0)
MCV: 92.9 fL (ref 80.0–100.0)
Monocytes Absolute: 0 10*3/uL — ABNORMAL LOW (ref 0.1–1.0)
Monocytes Relative: 0 %
Myelocytes: 1 %
Neutro Abs: 3.2 10*3/uL (ref 1.7–7.7)
Neutrophils Relative %: 61 %
Platelets: 313 10*3/uL (ref 150–400)
RBC: 3.67 MIL/uL — ABNORMAL LOW (ref 4.22–5.81)
RDW: 14.6 % (ref 11.5–15.5)
WBC: 5.3 10*3/uL (ref 4.0–10.5)
nRBC: 0 % (ref 0.0–0.2)
nRBC: 0 /100{WBCs}

## 2024-01-18 LAB — GLUCOSE, CAPILLARY
Glucose-Capillary: 264 mg/dL — ABNORMAL HIGH (ref 70–99)
Glucose-Capillary: 257 mg/dL — ABNORMAL HIGH (ref 70–99)
Glucose-Capillary: 272 mg/dL — ABNORMAL HIGH (ref 70–99)
Glucose-Capillary: 229 mg/dL — ABNORMAL HIGH (ref 70–99)

## 2024-01-18 LAB — COMPREHENSIVE METABOLIC PANEL WITH GFR
ALT: 14 U/L (ref 0–44)
AST: 16 U/L (ref 15–41)
Albumin: 2.8 g/dL — ABNORMAL LOW (ref 3.5–5.0)
Alkaline Phosphatase: 61 U/L (ref 38–126)
Anion gap: 10 (ref 5–15)
BUN: 15 mg/dL (ref 8–23)
CO2: 25 mmol/L (ref 22–32)
Calcium: 9.1 mg/dL (ref 8.9–10.3)
Chloride: 98 mmol/L (ref 98–111)
Creatinine, Ser: 0.65 mg/dL (ref 0.61–1.24)
GFR, Estimated: >60 mL/min (ref >60)
Glucose, Bld: 256 mg/dL — ABNORMAL HIGH (ref 70–99)
Potassium: 4.5 mmol/L (ref 3.5–5.1)
Sodium: 133 mmol/L — ABNORMAL LOW (ref 135–145)
Total Bilirubin: 0.5 mg/dL (ref 0.0–1.2)
Total Protein: 6.8 g/dL (ref 6.5–8.1)

## 2024-01-18 LAB — PHOSPHORUS: Phosphorus: 4.1 mg/dL (ref 2.5–4.6)

## 2024-01-18 LAB — MAGNESIUM: Magnesium: 2.2 mg/dL (ref 1.7–2.4)

## 2024-01-18 MED ORDER — INSULIN ASPART 100 UNIT/ML IJ SOLN
10.0000 [IU] | Freq: Three times a day (TID) | INTRAMUSCULAR | Status: DC
  Administered 2024-01-19 – 2024-01-22 (×12): 10 [IU] via SUBCUTANEOUS

## 2024-01-18 MED ORDER — INSULIN GLARGINE-YFGN 100 UNIT/ML ~~LOC~~ SOLN
50.0000 [IU] | Freq: Every day | SUBCUTANEOUS | Status: DC
  Administered 2024-01-18 – 2024-01-19 (×2): 50 [IU] via SUBCUTANEOUS
  Filled 2024-01-18 (×4): qty 0.5

## 2024-01-18 MED ORDER — BISACODYL 10 MG RE SUPP
10.0000 mg | Freq: Once | RECTAL | Status: DC
  Filled 2024-01-18: qty 1

## 2024-01-18 NOTE — Plan of Care (Signed)
   Problem: Education: Goal: Ability to describe self-care measures that may prevent or decrease complications (Diabetes Survival Skills Education) will improve Outcome: Completed/Met

## 2024-01-18 NOTE — Progress Notes (Addendum)
 PROGRESS NOTE    Micheal Moore.  ZOX:[id number] DOB: April 11, 1951 DOA: 01/14/2024 PCP: Rosslyn Coons, MD   Brief Narrative:  Micheal Moore. is a 73 y.o. 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  As per the patient and patient's wife patient has been having this rash for almost 2 to 3 months while he was in the rehab after his lower extremity surgery.  Last few days the rash has acutely worsened with increasing pain and discharge and patient has been having subjective feeling of fever chills.    ED Course: In the ER patient had a fever of 106 F recorded initially but subsequent which it was 99 degrees.  Labs show WBC count of 10.6 sodium 133 blood glucose 296.  CT abdomen pelvis was done which does not show any deep involvement but does show some constipation and stool burden.  Given the symptoms concerning for cellulitis started on empiric antibiotics after cultures obtained.  **Interim History Now adding Antifungals and Topical Medications being changed.  UnLikely this this is cellulitis we will stop his antibiotics and monitor off.  Change of the antifungals and adding topical antifungal as his rash is improving slowly.  Not as painful to the patient.  Assessment and Plan:  Fever with Rash: Mainly involving the lower half of the back and buttock area with pain and slight oozing; Initially thought to be Cellulitis but more characteristic of Fungal Infection. Among other differentials include contact dermatitis vs Cellulitis. Patient states he has been having this rash for almost 2 to 3 months which has acutely  worsened. ? Fever being spurious as resolved and has not had any since. Changed Topicals Tx Triamcinolone :Eucerin 1:1 TID to Clotrimazole  1%. C/w Fluconazole  IV for now. Continued Empiric Abx with IV Vanc and Cefepime  but unlikely Cellulitis so will monitor off Abx and D/C for now. Will need Hypoallergenic Bedsheets. C/w Hydroxyzine  25 mg po TIDprn Itching. Blood Cx x2 showing NGTD @ 2 Days and if not improving will need Fungal Cx. WOC to evaluate and will add low loss air mattress. PT/OT recommend SNF but Insurance offering a Peer to Peer. I called the company for a Peer to Peer and they were not able to connect me to the Medical director but was told they would escalate the issue for a Callback.  **ADDENDUM 17:37: Patient has been denied to go back to SNF based on his current level of mobility.  They feel that he is dependent and would not need skilled care.  Will update the patient  Uncontrolled Diabetes Mellitus Type 2:  Last hemoglobin A1c was 8.2 . Increased Semglee  to 40 units sq Daily but will go to 50 units today, Sensitive Novolog  SSI AC, and increased 8 units of Novolog  TIDwm to 10 units. CTM CBGs per Protocol. CBGs ranging from 257-274.  Will need tighter blood sugar control  CAD status post PCI: Denies any chest pain. Continue Aspirin  81 mg and Atorvastatin  10 mg po 2x Weekly   Chronic Indwelling Foley Catheter and BPH: Recently admitted for sepsis secondary UTI. Now back on [employer] Abx as above.  Hyponatremia: Na+ went from 133 ->  131 -> 136 -> 135 -> 133. LR @ 100 mL/hr now stopped. Repeat CMP in the AM  Normocyctic Anemia: Chronic; Likely Hemoconcentrated on Admission. Hgb/Hct dropped from admission at 13.2/40.9 but is now stable at 11.1/34.1 Anemia Panel was checked and showed an iron level of 31, UIBC 131, TIBC 162, saturation ratios 19%, ferritin level 223, folate level 13.1 and vitamin B12 level 264. CTM for S/Sx of Bleeding; no overt bleeding noted. Repeat CBC in the AM   Hx  of Orthostatic Hypotension: BP is around normal at this time but will closely observe.  Used to be on Midodrine  and Fludrocortisone  which patient has not been taking. IVF with LR at 100 mL/hr x 1 Day now stopped. PT/OT recommending SNF; Check Orthostatic VS in the AM.  Constipation: With stool burden noted on CT Scan as well as dilated stool-filled rectum similar to prior study which may indicate chronic fecal impaction with no proximal obstruction; Enema ordered by Admitting but may need to be repeated. Will place on Bowel regimen with Senna-Docusate 1 tab po BID, Miralax  17 gram po BID, and Bisacodyl  10 mg RC Dailyprn Morderate Constipation  Hypoalbuminemia: Patient's Albumin  trending down and went from 3.6 -> 2.6 -> 2.8. CTM and Trend and repeat CMP in the AM    DVT prophylaxis: enoxaparin  (LOVENOX ) injection 40 mg Start: 01/15/24 1000    Code Status: Full Code Family Communication: No family present at bedside   Disposition Plan:  Level of care: Telemetry Medical Status is: Inpatient Remains inpatient appropriate because: Needs SNF   Consultants:  None  Procedures:  As delineated as above  Antimicrobials:  Anti-infectives (From admission, onward)    Start     Dose/Rate Route Frequency Ordered Stop   01/16/24 1115  fluconazole  (DIFLUCAN ) IVPB 200 mg       Placed in "Followed by" Linked Group   200 mg 100 mL/hr over 60 Minutes Intravenous Every 24 hours 01/15/24 1029     01/15/24 1115  fluconazole  (DIFLUCAN ) IVPB 200 mg  Status:  Discontinued        200 mg 100 mL/hr over 60 Minutes Intravenous Every 24 hours 01/15/24 1019 01/15/24 1029   01/15/24 1115  fluconazole  (DIFLUCAN ) IVPB 400 mg       Placed in "Followed by" Linked Group   400 mg 100 mL/hr over 120 Minutes Intravenous  Once 01/15/24 1029 01/15/24 1348   01/15/24 0600  Vancomycin  (VANCOCIN ) 1,250 mg in sodium chloride  0.9 % 250 mL IVPB  Status:  Discontinued        1,250 mg 166.7 mL/hr over 90 Minutes Intravenous Every  12 hours 01/14/24 2334 01/16/24 1912   01/15/24 0200  ceFEPIme  (MAXIPIME ) 2 g in sodium chloride  0.9 % 100 mL IVPB  Status:  Discontinued        2 g 200 mL/hr over 30 Minutes Intravenous Every 8 hours 01/14/24 2334 01/16/24 1912   01/14/24 1715  vancomycin  (VANCOCIN ) IVPB 1000 mg/200 mL premix  Status:  Discontinued        1,000 mg 200 mL/hr over 60 Minutes Intravenous  Once 01/14/24 1700 01/14/24 1703   01/14/24 1715  ceFEPIme  (MAXIPIME ) 2 g in sodium chloride  0.9 % 100 mL IVPB        2 g 200 mL/hr over 30 Minutes Intravenous  Once 01/14/24 1700 01/14/24 1910   01/14/24 1715  vancomycin  (VANCOREADY) IVPB 1500 mg/300 mL        1,500 mg 150 mL/hr over 120 Minutes Intravenous  Once  01/14/24 1703 01/14/24 2037       Subjective: Seen and examined at bedside in thinks his back and his but is not as bad and still hurts his buttock but thinks his back is doing much better.  Able to roll his bed again in his bed.  No nausea or vomiting.  Denied any lightheadedness.  Feels okay and has no other concerns or complaints at this time.  Objective: Vitals:   01/17/24 1917 01/18/24 0504 01/18/24 0824 01/18/24 1634  BP: 114/79 124/85 (!) 143/85 116/77  Pulse: 92 78 97 92  Resp: 16 19 18 16   Temp: 98.2 F (36.8 C) 98.4 F (36.9 C) 98.7 F (37.1 C) 97.9 F (36.6 C)  TempSrc: Oral Oral Oral Oral  SpO2: 99% 98% 99% 99%  Weight:      Height:        Intake/Output Summary (Last 24 hours) at 01/18/2024 1738 Last data filed at 01/18/2024 1638 Gross per 24 hour  Intake 220 ml  Output 3600 ml  Net -3380 ml   Filed Weights   01/14/24 1654  Weight: 81.6 kg   Examination: Physical Exam:  Constitutional: Chronically ill-appearing Caucasian male who is resting Respiratory: Diminished to auscultation bilaterally, no wheezing, rales, rhonchi or crackles. Normal respiratory effort and patient is not tachypenic. No accessory muscle use.  Unlabored breathing Cardiovascular: RRR, no murmurs / rubs /  gallops. S1 and S2 auscultated. No extremity edema. .  Abdomen: Soft, non-tender, non-distended.  Bowel sounds positive.  GU: Deferred. Musculoskeletal: Has a left AKA Skin: Erythematous splotchy rash of the back is slowly improving is not as prominent.  His drainage is serosanguineous with his improved as well.  Buttocks continues have some area involvement and has some thin and frail skin. Neurologic: CN 2-12 grossly intact with no focal deficits.  Romberg sign and cerebellar reflexes not assessed.  Psychiatric: Normal judgment and insight. Alert and oriented x 3.   Data Reviewed: I have personally reviewed following labs and imaging studies  CBC: Recent Labs  Lab 01/14/24 1754 01/15/24 0001 01/15/24 0430 01/16/24 0530 01/17/24 0624 01/18/24 0513  WBC 10.6* 8.0 6.1 4.0 5.0 5.3  NEUTROABS 8.5*  --   --  2.3 2.8 3.2  HGB 13.2 10.7* 10.6* 9.8* 10.8* 11.1*  HCT 40.9 33.1* 33.3* 29.1* 33.0* 34.1*  MCV 93.0 93.5 94.6 91.8 92.2 92.9  PLT 299 244 226 228 221 313   Basic Metabolic Panel: Recent Labs  Lab 01/14/24 1754 01/15/24 0001 01/15/24 0428 01/15/24 0430 01/16/24 0530 01/17/24 0624 01/18/24 0513  NA 133*  --   --  131* 136 135 133*  K 3.9  --   --  3.5 4.2 4.8 4.5  CL 96*  --   --  98 102 100 98  CO2 24  --   --  22 26 25 25   GLUCOSE 296*  --   --  329* 321* 337* 256*  BUN 13  --   --  14 11 11 15   CREATININE 0.74 0.77  --  0.74 0.75 0.74 0.65  CALCIUM  9.0  --   --  8.7* 8.8* 8.9 9.1  MG  --   --  1.9  --  2.0 2.0 2.2  PHOS  --   --  3.3  --  3.4 3.7 4.1   GFR: Estimated Creatinine Clearance: 94.9 mL/min (by C-G formula based on SCr of 0.65 mg/dL). Liver Function Tests: Recent Labs  Lab 01/15/24 0430 01/16/24 0530 01/17/24 3664 01/18/24  0513  AST 12* 11* 15 16  ALT 10 12 11 14   ALKPHOS 64 57 59 61  BILITOT 0.9 0.6 0.8 0.5  PROT 5.9* 5.9* 6.2* 6.8  ALBUMIN  2.5* 2.3* 2.6* 2.8*   No results for input(s): "LIPASE", "AMYLASE" in the last 168 hours. No results  for input(s): "AMMONIA" in the last 168 hours. Coagulation Profile: No results for input(s): "INR", "PROTIME" in the last 168 hours. Cardiac Enzymes: No results for input(s): "CKTOTAL", "CKMB", "CKMBINDEX", "TROPONINI" in the last 168 hours. BNP (last 3 results) No results for input(s): "PROBNP" in the last 8760 hours. HbA1C: No results for input(s): "HGBA1C" in the last 72 hours. CBG: Recent Labs  Lab 01/17/24 1634 01/17/24 2104 01/18/24 0739 01/18/24 1118 01/18/24 1632  GLUCAP 274* 274* 264* 257* 272*   Lipid Profile: No results for input(s): "CHOL", "HDL", "LDLCALC", "TRIG", "CHOLHDL", "LDLDIRECT" in the last 72 hours. Thyroid  Function Tests: No results for input(s): "TSH", "T4TOTAL", "FREET4", "T3FREE", "THYROIDAB" in the last 72 hours. Anemia Panel: No results for input(s): "VITAMINB12", "FOLATE", "FERRITIN", "TIBC", "IRON", "RETICCTPCT" in the last 72 hours. Sepsis Labs: Recent Labs  Lab 01/14/24 1754 01/15/24 0849  LATICACIDVEN 1.9 1.3    Recent Results (from the past 240 hours)  Blood culture (routine x 2)     Status: None (Preliminary result)   Collection Time: 01/14/24  5:54 PM   Specimen: BLOOD LEFT FOREARM  Result Value Ref Range Status   Specimen Description BLOOD LEFT FOREARM  Final   Special Requests   Final    BOTTLES DRAWN AEROBIC AND ANAEROBIC Blood Culture results may not be optimal due to an inadequate volume of blood received in culture bottles   Culture   Final    NO GROWTH 4 DAYS Performed at [facility], [address], [place], [place] [address]    Report Status PENDING  Incomplete  Blood culture (routine x 2)     Status: None (Preliminary result)   Collection Time: 01/15/24 12:01 AM   Specimen: BLOOD RIGHT HAND  Result Value Ref Range Status   Specimen Description BLOOD RIGHT HAND  Final   Special Requests   Final    BOTTLES DRAWN AEROBIC AND ANAEROBIC Blood Culture adequate volume   Culture   Final    NO GROWTH 3 DAYS Performed  at [facility], [address] [place], [place] [address]    Report Status PENDING  Incomplete    Radiology Studies: No results found.  Scheduled Meds:  aspirin  EC  81 mg Oral Daily   atorvastatin   10 mg Oral Once per day on Monday Thursday   bisacodyl   10 mg Rectal Once   Chlorhexidine  Gluconate Cloth  6 each Topical Daily   clotrimazole    Topical BID   enoxaparin  (LOVENOX ) injection  40 mg Subcutaneous Q24H   insulin  aspart  0-9 Units Subcutaneous TID WC   [START ON 01/19/2024] insulin  aspart  10 Units Subcutaneous TID WC   insulin  glargine-yfgn  50 Units Subcutaneous QHS   polyethylene glycol  17 g Oral BID   senna-docusate  1 tablet Oral BID   Continuous Infusions:  fluconazole  (DIFLUCAN ) IV 200 mg (01/18/24 1112)    LOS: 4 days   Aura Leeds, DO [facility] Available via Epic secure chat 7am-7pm After these hours, please refer to coverage provider listed on [email or web address] 01/18/2024, 5:38 PM

## 2024-01-19 DIAGNOSIS — I951 Orthostatic hypotension: Secondary | ICD-10-CM | POA: Diagnosis not present

## 2024-01-19 DIAGNOSIS — E1169 Type 2 diabetes mellitus with other specified complication: Secondary | ICD-10-CM | POA: Diagnosis not present

## 2024-01-19 DIAGNOSIS — I739 Peripheral vascular disease, unspecified: Secondary | ICD-10-CM | POA: Diagnosis not present

## 2024-01-19 DIAGNOSIS — R21 Rash and other nonspecific skin eruption: Secondary | ICD-10-CM | POA: Diagnosis not present

## 2024-01-19 LAB — COMPREHENSIVE METABOLIC PANEL WITH GFR
ALT: 17 U/L (ref 0–44)
AST: 17 U/L (ref 15–41)
Albumin: 3.1 g/dL — ABNORMAL LOW (ref 3.5–5.0)
Alkaline Phosphatase: 63 U/L (ref 38–126)
Anion gap: 9 (ref 5–15)
BUN: 17 mg/dL (ref 8–23)
CO2: 25 mmol/L (ref 22–32)
Calcium: 9.2 mg/dL (ref 8.9–10.3)
Chloride: 99 mmol/L (ref 98–111)
Creatinine, Ser: 0.67 mg/dL (ref 0.61–1.24)
GFR, Estimated: >60 mL/min (ref >60)
Glucose, Bld: 278 mg/dL — ABNORMAL HIGH (ref 70–99)
Potassium: 4.8 mmol/L (ref 3.5–5.1)
Sodium: 133 mmol/L — ABNORMAL LOW (ref 135–145)
Total Bilirubin: 0.5 mg/dL (ref 0.0–1.2)
Total Protein: 6.7 g/dL (ref 6.5–8.1)

## 2024-01-19 LAB — CBC WITH DIFFERENTIAL/PLATELET
Abs Immature Granulocytes: 0.2 10*3/uL — ABNORMAL HIGH (ref 0.00–0.07)
Basophils Absolute: 0.1 10*3/uL (ref 0.0–0.1)
Basophils Relative: 1 %
Eosinophils Absolute: 0.3 10*3/uL (ref 0.0–0.5)
Eosinophils Relative: 5 %
HCT: 36.4 % — ABNORMAL LOW (ref 39.0–52.0)
Hemoglobin: 11.9 g/dL — ABNORMAL LOW (ref 13.0–17.0)
Immature Granulocytes: 4 %
Lymphocytes Relative: 40 %
Lymphs Abs: 2.1 10*3/uL (ref 0.7–4.0)
MCH: 30.4 pg (ref 26.0–34.0)
MCHC: 32.7 g/dL (ref 30.0–36.0)
MCV: 92.9 fL (ref 80.0–100.0)
Monocytes Absolute: 0.4 10*3/uL (ref 0.1–1.0)
Monocytes Relative: 8 %
Neutro Abs: 2.3 10*3/uL (ref 1.7–7.7)
Neutrophils Relative %: 42 %
Platelets: 315 10*3/uL (ref 150–400)
RBC: 3.92 MIL/uL — ABNORMAL LOW (ref 4.22–5.81)
RDW: 15 % (ref 11.5–15.5)
WBC: 5.4 10*3/uL (ref 4.0–10.5)
nRBC: 0 % (ref 0.0–0.2)

## 2024-01-19 LAB — CULTURE, BLOOD (ROUTINE X 2): Culture: NO GROWTH

## 2024-01-19 LAB — GLUCOSE, CAPILLARY
Glucose-Capillary: 257 mg/dL — ABNORMAL HIGH (ref 70–99)
Glucose-Capillary: 203 mg/dL — ABNORMAL HIGH (ref 70–99)
Glucose-Capillary: 244 mg/dL — ABNORMAL HIGH (ref 70–99)
Glucose-Capillary: 226 mg/dL — ABNORMAL HIGH (ref 70–99)

## 2024-01-19 LAB — PHOSPHORUS: Phosphorus: 4.2 mg/dL (ref 2.5–4.6)

## 2024-01-19 LAB — MAGNESIUM: Magnesium: 2.2 mg/dL (ref 1.7–2.4)

## 2024-01-19 MED ORDER — FLUCONAZOLE 100 MG PO TABS
200.0000 mg | ORAL_TABLET | Freq: Every day | ORAL | Status: DC
  Administered 2024-01-19 – 2024-01-22 (×4): 200 mg via ORAL
  Filled 2024-01-19 (×4): qty 2

## 2024-01-19 NOTE — Plan of Care (Signed)
  Problem: Clinical Measurements: Goal: Respiratory complications will improve Outcome: Progressing   Problem: Nutrition: Goal: Adequate nutrition will be maintained Outcome: Progressing   Problem: Coping: Goal: Level of anxiety will decrease Outcome: Progressing   

## 2024-01-19 NOTE — Progress Notes (Signed)
 PROGRESS NOTE    Micheal Moore.  UJW:[id number] DOB: [birth year] DOA: 01/14/2024 PCP: Rosslyn Coons, MD   Brief Narrative:  Micheal Moore. is a 73 y.o. 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  As per the patient and patient's wife patient has been having this rash for almost 2 to 3 months while he was in the rehab after his lower extremity surgery.  Last few days the rash has acutely worsened with increasing pain and discharge and patient has been having subjective feeling of fever chills.    ED Course: In the ER patient had a fever of 106 F recorded initially but subsequent which it was 99 degrees.  Labs show WBC count of 10.6 sodium 133 blood glucose 296.  CT abdomen pelvis was done which does not show any deep involvement but does show some constipation and stool burden.  Given the symptoms concerning for cellulitis started on empiric antibiotics after cultures obtained.  **Interim History Now adding Antifungals and Topical Medications being changed.  UnLikely this this is cellulitis we will stop his antibiotics and monitor off.  Change of the antifungals and adding topical antifungal as his rash is improving slowly.  Not as painful to the patient.  Right foot is little bit colder today so we will obtain ABIs as pedal pulse was not able to be palpated was able to palpate a pulse behind the ankle.  Assessment and Plan:  Fever with Rash: Fever resolved. Rash improving.Mainly involving the lower half of the back and buttock area with pain and slight oozing; Initially thought to be Cellulitis but more characteristic of  Fungal Infection. Among other differentials include contact dermatitis vs Cellulitis. Patient states he has been having this rash for almost 2 to 3 months which has acutely worsened. ? Fever being spurious as resolved and has not had any since. Changed Topicals Tx Triamcinolone :Eucerin 1:1 TID to Clotrimazole  1%. C/w Fluconazole  IV for now. Continued Empiric Abx with IV Vanc and Cefepime  but unlikely Cellulitis so will monitor off Abx and D/C for now. Will need Hypoallergenic Bedsheets. C/w Hydroxyzine  25 mg po TIDprn Itching. Blood Cx x2 showing NGTD @ 4 Days and if not improving will need Fungal Cx. WOC to evaluate and will add low loss air mattress. PT/OT recommend SNF but Insurance offered Peer to Peer which was done but has been denied to go back to SNF based on his current level of mobility.  They feel that he is dependent and would not need skilled care.  Updated the patient and he will appeal   Uncontrolled Diabetes Mellitus Type 2:  Last hemoglobin A1c was 8.2 . Increased Semglee  to 40 units sq Daily but will go to 50 units today, Sensitive Novolog  SSI AC, and increased 8 units of Novolog  TIDwm to 10 units. CTM CBGs per Protocol. CBGs ranging from 203-272.  Will need tighter blood sugar control  CAD status post PCI: Denies any chest pain. Continue Aspirin  81 mg and Atorvastatin  10 mg po 2x Weekly   Suspected PAD: Foot colder today so will Obtain ABI. C/w ASA and Atorvastatin . Follow ABI results and may need Vascular evalaution  Chronic Indwelling Foley Catheter and BPH: Recently admitted for sepsis secondary UTI.  Broad Spectrum Abx have stopped as above.   Hyponatremia: Na+ went from 133 -> 131 -> 136 -> 135 -> 133. LR @ 100 mL/hr now stopped. Repeat CMP in the AM  Normocyctic Anemia: Chronic; Likely Hemoconcentrated on Admission. Hgb/Hct dropped from admission at 13.2/40.9 but is now stable at 11.9/36.4. Anemia Panel was checked and showed an iron level of 31, UIBC 131, TIBC 162, saturation  ratios 19%, ferritin level 223, folate level 13.1 and vitamin B12 level 264. CTM for S/Sx of Bleeding; no overt bleeding noted. Repeat CBC in the AM   Hx of Orthostatic Hypotension: BP is around normal at this time but will closely observe.  Used to be on Midodrine  and Fludrocortisone  which patient has not been taking. IVF with LR at 100 mL/hr x 1 Day now stopped. PT/OT recommending SNF; Check Orthostatic VS in the AM.  Constipation: With stool burden noted on CT Scan as well as dilated stool-filled rectum similar to prior study which may indicate chronic fecal impaction with no proximal obstruction; Enema ordered by Admitting but may need to be repeated. Will place on Bowel regimen with Senna-Docusate 1 tab po BID, Miralax  17 gram po BID, and Bisacodyl  10 mg RC Dailyprn Morderate Constipation  Hypoalbuminemia: Patient's Albumin  trending down and went from 3.6 -> 2.6 -> 2.8 -> 3.1. CTM and Trend and repeat CMP in the AM    DVT prophylaxis: enoxaparin  (LOVENOX ) injection 40 mg Start: 01/15/24 1000    Code Status: Full Code Family Communication: No family present @ bedside  Disposition Plan:  Level of care: Telemetry Medical Status is: Inpatient Remains inpatient appropriate because: Needs further clinical improvement   Consultants:  None  Procedures:  As delineated as above  Antimicrobials:  Anti-infectives (From admission, onward)    Start     Dose/Rate Route Frequency Ordered Stop   01/19/24 1300  fluconazole  (DIFLUCAN ) tablet 200 mg        200 mg Oral Daily 01/19/24 1205     01/16/24 1115  fluconazole  (DIFLUCAN ) IVPB 200 mg  Status:  Discontinued       Placed in "Followed by" Linked Group   200 mg 100 mL/hr over 60 Minutes Intravenous Every 24 hours 01/15/24 1029 01/19/24 1205   01/15/24 1115  fluconazole  (DIFLUCAN ) IVPB 200 mg  Status:  Discontinued        200 mg 100 mL/hr over 60 Minutes Intravenous Every 24 hours 01/15/24 1019 01/15/24 1029   01/15/24 1115  fluconazole   (DIFLUCAN ) IVPB 400 mg       Placed in "Followed by" Linked Group   400 mg 100 mL/hr over 120 Minutes Intravenous  Once 01/15/24 1029 01/15/24 1348   01/15/24 0600  Vancomycin  (VANCOCIN ) 1,250 mg in sodium chloride  0.9 % 250 mL IVPB  Status:  Discontinued        1,250 mg 166.7 mL/hr over 90 Minutes Intravenous Every 12 hours 01/14/24 2334 01/16/24 1912   01/15/24 0200  ceFEPIme  (MAXIPIME ) 2 g in sodium chloride  0.9 % 100 mL IVPB  Status:  Discontinued        2 g 200 mL/hr over 30 Minutes Intravenous Every 8 hours 01/14/24 2334 01/16/24 1912   01/14/24 1715  vancomycin  (VANCOCIN ) IVPB 1000 mg/200 mL premix  Status:  Discontinued        1,000 mg 200 mL/hr over 60 Minutes Intravenous  Once 01/14/24 1700 01/14/24 1703   01/14/24 1715  ceFEPIme  (  MAXIPIME ) 2 g in sodium chloride  0.9 % 100 mL IVPB        2 g 200 mL/hr over 30 Minutes Intravenous  Once 01/14/24 1700 01/14/24 1910   01/14/24 1715  vancomycin  (VANCOREADY) IVPB 1500 mg/300 mL        1,500 mg 150 mL/hr over 120 Minutes Intravenous  Once 01/14/24 1703 01/14/24 2037       Subjective: Seen and examined at bedside and is resting in bed.  States that his buttock is not as painful now and does not really itch anymore.  No lightheadedness or dizziness.  Thinks his rash is getting better.  Disappointed that insurance company denied his SNF stay at [facility] [facility] and states that he would be appealing.  Objective: Vitals:   01/18/24 1634 01/18/24 2032 01/19/24 0444 01/19/24 0819  BP: 116/77 96/70 119/74 (!) 140/87  Pulse: 92 97 85 95  Resp: 16 17  18   Temp: 97.9 F (36.6 C) 98.3 F (36.8 C) 98.2 F (36.8 C) 98.3 F (36.8 C)  TempSrc: Oral Oral Oral   SpO2: 99% 97% 94% 97%  Weight:      Height:        Intake/Output Summary (Last 24 hours) at 01/19/2024 1841 Last data filed at 01/19/2024 0600 Gross per 24 hour  Intake 220 ml  Output 2475 ml  Net -2255 ml   Filed Weights   01/14/24 1654  Weight: 81.6 kg   Examination: Physical  Exam:  Constitutional: Chronically ill-appearing Caucasian male who is resting Respiratory: Diminished to auscultation bilaterally, no wheezing, rales, rhonchi or crackles. Normal respiratory effort and patient is not tachypenic. No accessory muscle use.  Unlabored breathing Cardiovascular: RRR, no murmurs / rubs / gallops. S1 and S2 auscultated. No extremity edema.  Unable to palpate his pedal pulse on the right.  Abdomen: Soft, non-tender, non-distended. Bowel sounds positive.  GU: Deferred. Musculoskeletal: Has a left AKA Skin: Erythematous splotchy rash of the back is slowly improving but persistent.  Serosanguineous drainage is improved.  Buttocks continues have some area involvement has some thin frail skin Neurologic: CN 2-12 grossly intact with no focal deficits.  Romberg sign and cerebellar reflexes not assessed.  Psychiatric: Normal judgment and insight. Alert and oriented x 3.   Data Reviewed: I have personally reviewed following labs and imaging studies  CBC: Recent Labs  Lab 01/14/24 1754 01/15/24 0001 01/15/24 0430 01/16/24 0530 01/17/24 0624 01/18/24 0513 01/19/24 0729  WBC 10.6*   < > 6.1 4.0 5.0 5.3 5.4  NEUTROABS 8.5*  --   --  2.3 2.8 3.2 2.3  HGB 13.2   < > 10.6* 9.8* 10.8* 11.1* 11.9*  HCT 40.9   < > 33.3* 29.1* 33.0* 34.1* 36.4*  MCV 93.0   < > 94.6 91.8 92.2 92.9 92.9  PLT 299   < > 226 228 221 313 315   < > = values in this interval not displayed.   Basic Metabolic Panel: Recent Labs  Lab 01/15/24 0428 01/15/24 0430 01/16/24 0530 01/17/24 0624 01/18/24 0513 01/19/24 0729  NA  --  131* 136 135 133* 133*  K  --  3.5 4.2 4.8 4.5 4.8  CL  --  98 102 100 98 99  CO2  --  22 26 25 25 25   GLUCOSE  --  329* 321* 337* 256* 278*  BUN  --  14 11 11 15 17   CREATININE  --  0.74 0.75 0.74 0.65 0.67  CALCIUM   --  8.7* 8.8*  8.9 9.1 9.2  MG 1.9  --  2.0 2.0 2.2 2.2  PHOS 3.3  --  3.4 3.7 4.1 4.2   GFR: Estimated Creatinine Clearance: 94.9 mL/min (by C-G  formula based on SCr of 0.67 mg/dL). Liver Function Tests: Recent Labs  Lab 01/15/24 0430 01/16/24 0530 01/17/24 0624 01/18/24 0513 01/19/24 0729  AST 12* 11* 15 16 17   ALT 10 12 11 14 17   ALKPHOS 64 57 59 61 63  BILITOT 0.9 0.6 0.8 0.5 0.5  PROT 5.9* 5.9* 6.2* 6.8 6.7  ALBUMIN  2.5* 2.3* 2.6* 2.8* 3.1*   No results for input(s): "LIPASE", "AMYLASE" in the last 168 hours. No results for input(s): "AMMONIA" in the last 168 hours. Coagulation Profile: No results for input(s): "INR", "PROTIME" in the last 168 hours. Cardiac Enzymes: No results for input(s): "CKTOTAL", "CKMB", "CKMBINDEX", "TROPONINI" in the last 168 hours. BNP (last 3 results) No results for input(s): "PROBNP" in the last 8760 hours. HbA1C: No results for input(s): "HGBA1C" in the last 72 hours. CBG: Recent Labs  Lab 01/18/24 1632 01/18/24 2236 01/19/24 0732 01/19/24 1131 01/19/24 1629  GLUCAP 272* 229* 257* 203* 244*   Lipid Profile: No results for input(s): "CHOL", "HDL", "LDLCALC", "TRIG", "CHOLHDL", "LDLDIRECT" in the last 72 hours. Thyroid  Function Tests: No results for input(s): "TSH", "T4TOTAL", "FREET4", "T3FREE", "THYROIDAB" in the last 72 hours. Anemia Panel: No results for input(s): "VITAMINB12", "FOLATE", "FERRITIN", "TIBC", "IRON", "RETICCTPCT" in the last 72 hours. Sepsis Labs: Recent Labs  Lab 01/14/24 1754 01/15/24 0849  LATICACIDVEN 1.9 1.3   Recent Results (from the past 240 hours)  Blood culture (routine x 2)     Status: None   Collection Time: 01/14/24  5:54 PM   Specimen: BLOOD LEFT FOREARM  Result Value Ref Range Status   Specimen Description BLOOD LEFT FOREARM  Final   Special Requests   Final    BOTTLES DRAWN AEROBIC AND ANAEROBIC Blood Culture results may not be optimal due to an inadequate volume of blood received in culture bottles   Culture   Final    NO GROWTH 5 DAYS Performed at [facility], [address] [place], [place] [address]    Report Status  01/19/2024 FINAL  Final  Blood culture (routine x 2)     Status: None (Preliminary result)   Collection Time: 01/15/24 12:01 AM   Specimen: BLOOD RIGHT HAND  Result Value Ref Range Status   Specimen Description BLOOD RIGHT HAND  Final   Special Requests   Final    BOTTLES DRAWN AEROBIC AND ANAEROBIC Blood Culture adequate volume   Culture   Final    NO GROWTH 4 DAYS Performed at [facility], [address], [place], [place] [address]    Report Status PENDING  Incomplete    Radiology Studies: No results found.  Scheduled Meds:  aspirin  EC  81 mg Oral Daily   atorvastatin   10 mg Oral Once per day on Monday Thursday   bisacodyl   10 mg Rectal Once   Chlorhexidine  Gluconate Cloth  6 each Topical Daily   clotrimazole    Topical BID   enoxaparin  (LOVENOX ) injection  40 mg Subcutaneous Q24H   fluconazole   200 mg Oral Daily   insulin  aspart  0-9 Units Subcutaneous TID WC   insulin  aspart  10 Units Subcutaneous TID WC   insulin  glargine-yfgn  50 Units Subcutaneous QHS   polyethylene glycol  17 g Oral BID   senna-docusate  1 tablet Oral BID  Continuous Infusions:   LOS: 5 days   Aura Leeds, DO [facility] Available via Epic secure chat 7am-7pm After these hours, please refer to coverage provider listed on [email or web address] 01/19/2024, 6:41 PM

## 2024-01-20 ENCOUNTER — Encounter (HOSPITAL_COMMUNITY)

## 2024-01-20 DIAGNOSIS — I739 Peripheral vascular disease, unspecified: Secondary | ICD-10-CM | POA: Diagnosis not present

## 2024-01-20 DIAGNOSIS — I951 Orthostatic hypotension: Secondary | ICD-10-CM | POA: Diagnosis not present

## 2024-01-20 DIAGNOSIS — E1169 Type 2 diabetes mellitus with other specified complication: Secondary | ICD-10-CM | POA: Diagnosis not present

## 2024-01-20 DIAGNOSIS — R21 Rash and other nonspecific skin eruption: Secondary | ICD-10-CM | POA: Diagnosis not present

## 2024-01-20 LAB — COMPREHENSIVE METABOLIC PANEL WITH GFR
ALT: 18 U/L (ref 0–44)
AST: 17 U/L (ref 15–41)
Albumin: 3.5 g/dL (ref 3.5–5.0)
Alkaline Phosphatase: 70 U/L (ref 38–126)
Anion gap: 10 (ref 5–15)
BUN: 23 mg/dL (ref 8–23)
CO2: 27 mmol/L (ref 22–32)
Calcium: 10.1 mg/dL (ref 8.9–10.3)
Chloride: 98 mmol/L (ref 98–111)
Creatinine, Ser: 0.85 mg/dL (ref 0.61–1.24)
GFR, Estimated: >60 mL/min (ref >60)
Glucose, Bld: 194 mg/dL — ABNORMAL HIGH (ref 70–99)
Potassium: 4.7 mmol/L (ref 3.5–5.1)
Sodium: 135 mmol/L (ref 135–145)
Total Bilirubin: 0.5 mg/dL (ref 0.0–1.2)
Total Protein: 7.9 g/dL (ref 6.5–8.1)

## 2024-01-20 LAB — GLUCOSE, CAPILLARY
Glucose-Capillary: 287 mg/dL — ABNORMAL HIGH (ref 70–99)
Glucose-Capillary: 258 mg/dL — ABNORMAL HIGH (ref 70–99)
Glucose-Capillary: 215 mg/dL — ABNORMAL HIGH (ref 70–99)
Glucose-Capillary: 207 mg/dL — ABNORMAL HIGH (ref 70–99)

## 2024-01-20 LAB — CBC WITH DIFFERENTIAL/PLATELET
Abs Immature Granulocytes: 0.25 10*3/uL — ABNORMAL HIGH (ref 0.00–0.07)
Basophils Absolute: 0.1 10*3/uL (ref 0.0–0.1)
Basophils Relative: 1 %
Eosinophils Absolute: 0.3 10*3/uL (ref 0.0–0.5)
Eosinophils Relative: 5 %
HCT: 40.9 % (ref 39.0–52.0)
Hemoglobin: 13.2 g/dL (ref 13.0–17.0)
Immature Granulocytes: 4 %
Lymphocytes Relative: 32 %
Lymphs Abs: 2 10*3/uL (ref 0.7–4.0)
MCH: 30.4 pg (ref 26.0–34.0)
MCHC: 32.3 g/dL (ref 30.0–36.0)
MCV: 94.2 fL (ref 80.0–100.0)
Monocytes Absolute: 0.4 10*3/uL (ref 0.1–1.0)
Monocytes Relative: 7 %
Neutro Abs: 3.1 10*3/uL (ref 1.7–7.7)
Neutrophils Relative %: 51 %
Platelets: UNDETERMINED 10*3/uL (ref 150–400)
RBC: 4.34 MIL/uL (ref 4.22–5.81)
RDW: 14.9 % (ref 11.5–15.5)
WBC: 6.2 10*3/uL (ref 4.0–10.5)
nRBC: 0 % (ref 0.0–0.2)

## 2024-01-20 LAB — PHOSPHORUS: Phosphorus: 3.9 mg/dL (ref 2.5–4.6)

## 2024-01-20 LAB — CULTURE, BLOOD (ROUTINE X 2)
Culture: NO GROWTH
Special Requests: ADEQUATE

## 2024-01-20 LAB — MAGNESIUM: Magnesium: 2.3 mg/dL (ref 1.7–2.4)

## 2024-01-20 MED ORDER — INSULIN GLARGINE-YFGN 100 UNIT/ML ~~LOC~~ SOLN
60.0000 [IU] | Freq: Every day | SUBCUTANEOUS | Status: DC
  Administered 2024-01-20 – 2024-01-21 (×2): 60 [IU] via SUBCUTANEOUS
  Filled 2024-01-20 (×3): qty 0.6

## 2024-01-20 MED ORDER — BISACODYL 10 MG RE SUPP
10.0000 mg | Freq: Once | RECTAL | Status: AC
  Administered 2024-01-20: 10 mg via RECTAL
  Filled 2024-01-20: qty 1

## 2024-01-20 NOTE — Plan of Care (Signed)
  Problem: Metabolic: Goal: Ability to maintain appropriate glucose levels will improve Outcome: Progressing   Problem: Nutritional: Goal: Maintenance of adequate nutrition will improve Outcome: Progressing   Problem: Skin Integrity: Goal: Risk for impaired skin integrity will decrease Outcome: Progressing   Problem: Clinical Measurements: Goal: Ability to maintain clinical measurements within normal limits will improve Outcome: Progressing Goal: Will remain free from infection Outcome: Progressing   Problem: Pain Managment: Goal: General experience of comfort will improve and/or be controlled Outcome: Progressing   Problem: Safety: Goal: Ability to remain free from injury will improve Outcome: Progressing   Problem: Skin Integrity: Goal: Risk for impaired skin integrity will decrease Outcome: Progressing

## 2024-01-20 NOTE — Progress Notes (Signed)
 PROGRESS NOTE    Micheal Contras.  MWN:[id number] DOB: 03-20-1951 DOA: 01/14/2024 PCP: Rosslyn Coons, MD   Brief Narrative:  Micheal Topp. is a 73 y.o. 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  As per the patient and patient's wife patient has been having this rash for almost 2 to 3 months while he was in the rehab after his lower extremity surgery.  Last few days the rash has acutely worsened with increasing pain and discharge and patient has been having subjective feeling of fever chills.    ED Course: In the ER patient had a fever of 106 F recorded initially but subsequent which it was 99 degrees.  Labs show WBC count of 10.6 sodium 133 blood glucose 296.  CT abdomen pelvis was done which does not show any deep involvement but does show some constipation and stool burden.  Given the symptoms concerning for cellulitis started on empiric antibiotics after cultures obtained.  **Interim History Now adding Antifungals and Topical Medications being changed.  Unlikely this this is cellulitis we will stop his antibiotics and monitor off.  Change of the antifungals and adding topical antifungal as his rash is improving slowly.  Not as painful to the patient.  Right foot was little bit colder yesterday so we will obtain ABIs as pedal pulse was not able to be palpated was able to palpate a pulse behind the ankle.  Assessment and Plan:  Fever with Rash: Fever resolved. Rash improving.Mainly involving the lower half of the back and buttock area with pain and slight oozing; Initially thought to be Cellulitis but more  characteristic of Fungal Infection. Among other differentials include contact dermatitis vs Cellulitis. Patient states he has been having this rash for almost 2 to 3 months which has acutely worsened. ? Fever being spurious as resolved and has not had any since. Changed Topicals Tx Triamcinolone :Eucerin 1:1 TID to Clotrimazole  1%. C/w Fluconazole  but changed to po. D/C'd Empiric Abx with IV Vanc and Cefepime  as unlikely Cellulitis so will monitor off Abx. Will need Hypoallergenic Bedsheets. C/w Hydroxyzine  25 mg po TIDprn Itching. Blood Cx x2 showing NGTD @ 5 Days and if not improving will need Fungal Cx. WOC to evaluate; C/w low loss air mattress. PT/OT recommend SNF but Insurance offered Peer to Peer which was done but has been denied to go back to SNF based on his current level of mobility.  They feel that he is dependent and would not need skilled care.  Updated the patient and he has started Appeal Process  Uncontrolled Diabetes Mellitus Type 2:  Last hemoglobin A1c was 8.2 . Increased Semglee  to 60 units sq Daily today, Sensitive Novolog  SSI AC, and increased 8 units of Novolog  TIDwm to 10 units. CTM CBGs per Protocol. CBGs ranging from 215-287.  Will need tighter blood sugar control  CAD status post PCI: Denies any chest pain. Continue Aspirin  81 mg and Atorvastatin  10 mg po 2x Weekly   Suspected PAD: Foot colder yesterday so will Obtain ABI and pending. C/w ASA and Atorvastatin . Follow ABI results and may need Vascular evalaution  Chronic Indwelling Foley Catheter and BPH: Recently admitted  for sepsis secondary UTI.  Broad Spectrum Abx have stopped as above.   Hyponatremia: Na+ went from 133 -> 131 -> 136 -> 135 -> 133 -> 135. LR @ 100 mL/hr now stopped. Repeat CMP in the AM  Normocyctic Anemia: Chronic; Hgb/Hct dropped from admission at 13.2/40.9-> 11.9/36.4 but is now 13.2/40.9. Anemia Panel was checked and showed an iron level of 31, UIBC 131, TIBC 162, saturation ratios 19%, ferritin level  223, folate level 13.1 and vitamin B12 level 264. CTM for S/Sx of Bleeding; no overt bleeding noted. Repeat CBC in the AM   Hx of Orthostatic Hypotension: BP is around normal at this time but will closely observe.  Used to be on Midodrine  and Fludrocortisone  which patient has not been taking. IVF with LR at 100 mL/hr stopped. PT/OT recommending SNF; Check Orthostatic VS in the AM.  Constipation: With stool burden noted on CT Scan as well as dilated stool-filled rectum similar to prior study which may indicate chronic fecal impaction with no proximal obstruction; Enema ordered by Admitting but may need to be repeated. Will place on Bowel regimen with Senna-Docusate 1 tab po BID, Miralax  17 gram po BID, and Bisacodyl  10 mg RC Dailyprn Morderate Constipation  Hypoalbuminemia: Patient's Albumin  trending down and went from 3.6 -> 2.6 -> 2.8 -> 3.1 -> 3.5. CTM and Trend and repeat CMP in the AM    DVT prophylaxis: enoxaparin  (LOVENOX ) injection 40 mg Start: 01/15/24 1000    Code Status: Full Code Family Communication: No family present @ bedside  Disposition Plan:  Level of care: Telemetry Medical Status is: Inpatient Remains inpatient appropriate because: Continues to improve slowly. PT/OT recommending SNF but was denied English as a second language teacher and now appealing D/C   Consultants:  None  Procedures:  As delineated as above  Antimicrobials:  Anti-infectives (From admission, onward)    Start     Dose/Rate Route Frequency Ordered Stop   01/19/24 1300  fluconazole  (DIFLUCAN ) tablet 200 mg        200 mg Oral Daily 01/19/24 1205     01/16/24 1115  fluconazole  (DIFLUCAN ) IVPB 200 mg  Status:  Discontinued       Placed in "Followed by" Linked Group   200 mg 100 mL/hr over 60 Minutes Intravenous Every 24 hours 01/15/24 1029 01/19/24 1205   01/15/24 1115  fluconazole  (DIFLUCAN ) IVPB 200 mg  Status:  Discontinued        200 mg 100 mL/hr over 60 Minutes Intravenous Every 24 hours 01/15/24 1019  01/15/24 1029   01/15/24 1115  fluconazole  (DIFLUCAN ) IVPB 400 mg       Placed in "Followed by" Linked Group   400 mg 100 mL/hr over 120 Minutes Intravenous  Once 01/15/24 1029 01/15/24 1348   01/15/24 0600  Vancomycin  (VANCOCIN ) 1,250 mg in sodium chloride  0.9 % 250 mL IVPB  Status:  Discontinued        1,250 mg 166.7 mL/hr over 90 Minutes Intravenous Every 12 hours 01/14/24 2334 01/16/24 1912   01/15/24 0200  ceFEPIme  (MAXIPIME ) 2 g in sodium chloride  0.9 % 100 mL IVPB  Status:  Discontinued        2 g 200 mL/hr over 30 Minutes Intravenous Every 8 hours 01/14/24 2334 01/16/24 1912   01/14/24 1715  vancomycin  (VANCOCIN ) IVPB 1000 mg/200 mL premix  Status:  Discontinued        1,000 mg 200 mL/hr over 60 Minutes Intravenous  Once 01/14/24 1700 01/14/24 1703   01/14/24 1715  ceFEPIme  (MAXIPIME )  2 g in sodium chloride  0.9 % 100 mL IVPB        2 g 200 mL/hr over 30 Minutes Intravenous  Once 01/14/24 1700 01/14/24 1910   01/14/24 1715  vancomycin  (VANCOREADY) IVPB 1500 mg/300 mL        1,500 mg 150 mL/hr over 120 Minutes Intravenous  Once 01/14/24 1703 01/14/24 2037       Subjective: Examined at bedside and is resting and thinks he is doing better.  Back is not as painful now and his main complaint is that he has not had a bowel movement.  No nausea or vomiting.  States that he will be appealing SNF denial given that wife cannot take care of him at home.  Objective: Vitals:   01/19/24 2214 01/20/24 0617 01/20/24 0800 01/20/24 1630  BP: 133/82 (!) 151/93 122/77 121/71  Pulse: 87 83 98 97  Resp: 18 18  18   Temp: 98.5 F (36.9 C) 98.6 F (37 C) 98.6 F (37 C)   TempSrc:  Oral    SpO2: 97% 98% 98% 100%  Weight:      Height:        Intake/Output Summary (Last 24 hours) at 01/20/2024 1954 Last data filed at 01/20/2024 1700 Gross per 24 hour  Intake 1200 ml  Output 2700 ml  Net -1500 ml   Filed Weights   01/14/24 1654  Weight: 81.6 kg   Examination: Physical  Exam:  Constitutional: Chronically ill-appearing elderly Caucasian male in no acute distress who appears resting Respiratory: Diminished to auscultation bilaterally, no wheezing, rales, rhonchi or crackles. Normal respiratory effort and patient is not tachypenic. No accessory muscle use.  Unlabored breathing Cardiovascular: RRR, no murmurs / rubs / gallops. S1 and S2 auscultated.  Mild extremity edema in the lower right extremity Abdomen: Soft, non-tender, non-distended. Bowel sounds positive.  GU: Deferred. Musculoskeletal: Has a left AKA Skin: Has a erythematous splotchy rash of the back which is slowly improving.  Serosanguineous drainage is not really there.  Buttocks continues to have some and various involvement as some thin frail skin.  Has a Mepilex pad in place Neurologic: CN 2-12 grossly intact with no focal deficits. Romberg sign and cerebellar reflexes not assessed.  Psychiatric: Normal judgment and insight. Alert and oriented x 3.   Data Reviewed: I have personally reviewed following labs and imaging studies  CBC: Recent Labs  Lab 01/16/24 0530 01/17/24 0624 01/18/24 0513 01/19/24 0729 01/20/24 0501  WBC 4.0 5.0 5.3 5.4 6.2  NEUTROABS 2.3 2.8 3.2 2.3 3.1  HGB 9.8* 10.8* 11.1* 11.9* 13.2  HCT 29.1* 33.0* 34.1* 36.4* 40.9  MCV 91.8 92.2 92.9 92.9 94.2  PLT 228 221 313 315 PLATELET CLUMPS NOTED ON SMEAR, UNABLE TO ESTIMATE   Basic Metabolic Panel: Recent Labs  Lab 01/16/24 0530 01/17/24 0624 01/18/24 0513 01/19/24 0729 01/20/24 0501  NA 136 135 133* 133* 135  K 4.2 4.8 4.5 4.8 4.7  CL 102 100 98 99 98  CO2 26 25 25 25 27   GLUCOSE 321* 337* 256* 278* 194*  BUN 11 11 15 17 23   CREATININE 0.75 0.74 0.65 0.67 0.85  CALCIUM  8.8* 8.9 9.1 9.2 10.1  MG 2.0 2.0 2.2 2.2 2.3  PHOS 3.4 3.7 4.1 4.2 3.9   GFR: Estimated Creatinine Clearance: 89.3 mL/min (by C-G formula based on SCr of 0.85 mg/dL). Liver Function Tests: Recent Labs  Lab 01/16/24 0530 01/17/24 0624  01/18/24 0513 01/19/24 0729 01/20/24 0501  AST 11* 15 16 17  17  ALT 12 11 14 17 18   ALKPHOS 57 59 61 63 70  BILITOT 0.6 0.8 0.5 0.5 0.5  PROT 5.9* 6.2* 6.8 6.7 7.9  ALBUMIN  2.3* 2.6* 2.8* 3.1* 3.5   No results for input(s): "LIPASE", "AMYLASE" in the last 168 hours. No results for input(s): "AMMONIA" in the last 168 hours. Coagulation Profile: No results for input(s): "INR", "PROTIME" in the last 168 hours. Cardiac Enzymes: No results for input(s): "CKTOTAL", "CKMB", "CKMBINDEX", "TROPONINI" in the last 168 hours. BNP (last 3 results) No results for input(s): "PROBNP" in the last 8760 hours. HbA1C: No results for input(s): "HGBA1C" in the last 72 hours. CBG: Recent Labs  Lab 01/19/24 1629 01/19/24 2217 01/20/24 0828 01/20/24 1310 01/20/24 1631  GLUCAP 244* 226* 287* 258* 215*   Lipid Profile: No results for input(s): "CHOL", "HDL", "LDLCALC", "TRIG", "CHOLHDL", "LDLDIRECT" in the last 72 hours. Thyroid  Function Tests: No results for input(s): "TSH", "T4TOTAL", "FREET4", "T3FREE", "THYROIDAB" in the last 72 hours. Anemia Panel: No results for input(s): "VITAMINB12", "FOLATE", "FERRITIN", "TIBC", "IRON", "RETICCTPCT" in the last 72 hours. Sepsis Labs: Recent Labs  Lab 01/14/24 1754 01/15/24 0849  LATICACIDVEN 1.9 1.3   Recent Results (from the past 240 hours)  Blood culture (routine x 2)     Status: None   Collection Time: 01/14/24  5:54 PM   Specimen: BLOOD LEFT FOREARM  Result Value Ref Range Status   Specimen Description BLOOD LEFT FOREARM  Final   Special Requests   Final    BOTTLES DRAWN AEROBIC AND ANAEROBIC Blood Culture results may not be optimal due to an inadequate volume of blood received in culture bottles   Culture   Final    NO GROWTH 5 DAYS Performed at [facility], [address] [place], [place] [address]    Report Status 01/19/2024 FINAL  Final  Blood culture (routine x 2)     Status: None   Collection Time: 01/15/24 12:01 AM    Specimen: BLOOD RIGHT HAND  Result Value Ref Range Status   Specimen Description BLOOD RIGHT HAND  Final   Special Requests   Final    BOTTLES DRAWN AEROBIC AND ANAEROBIC Blood Culture adequate volume   Culture   Final    NO GROWTH 5 DAYS Performed at [facility], [address] [place], [place] [address]    Report Status 01/20/2024 FINAL  Final    Radiology Studies: No results found.  Scheduled Meds:  aspirin  EC  81 mg Oral Daily   atorvastatin   10 mg Oral Once per day on Monday Thursday   bisacodyl   10 mg Rectal Once   bisacodyl   10 mg Rectal Once   Chlorhexidine  Gluconate Cloth  6 each Topical Daily   clotrimazole    Topical BID   enoxaparin  (LOVENOX ) injection  40 mg Subcutaneous Q24H   fluconazole   200 mg Oral Daily   insulin  aspart  0-9 Units Subcutaneous TID WC   insulin  aspart  10 Units Subcutaneous TID WC   insulin  glargine-yfgn  60 Units Subcutaneous QHS   polyethylene glycol  17 g Oral BID   senna-docusate  1 tablet Oral BID   Continuous Infusions:   LOS: 6 days   Aura Leeds, DO [facility] Available via Epic secure chat 7am-7pm After these hours, please refer to coverage provider listed on [email or web address] 01/20/2024, 7:54 PM

## 2024-01-20 NOTE — Inpatient Diabetes Management (Signed)
 Inpatient Diabetes Program Recommendations  AACE/ADA: New Consensus Statement on Inpatient Glycemic Control (2015)  Target Ranges:  Prepandial:   less than 140 mg/dL      Peak postprandial:   less than 180 mg/dL (1-2 hours)      Critically ill patients:  140 - 180 mg/dL   Lab Results  Component Value Date   GLUCAP 258 (H) 01/20/2024   HGBA1C 8.2 (H) 01/15/2024    Review of Glycemic Control  Latest Reference Range & Units 01/19/24 07:32 01/19/24 11:31 01/19/24 16:29 01/19/24 22:17 01/20/24 08:28 01/20/24 13:10  Glucose-Capillary 70 - 99 mg/dL 010 (H) 272 (H) 536 (H) 226 (H) 287 (H) 258 (H)   Diabetes history: DM 2 Outpatient Diabetes medications:  Novolog  8 units tid with meals  Levemir  20 units daily Current orders for Inpatient glycemic control:  Novolog  0-9 units tid with meals  Novolog  10 units tid with meals Semglee  50 units daily Inpatient Diabetes Program Recommendations:    Consider increasing Semglee  to 60 units daily.   Thanks,  Josefa Ni, RN, BC-ADM Inpatient Diabetes Coordinator Pager [phone]  (8a-5p)

## 2024-01-20 NOTE — Progress Notes (Signed)
 Physical Therapy Treatment Patient Details Name: Micheal Moore. MRN: [id number] DOB: 09-09-1951 Today's Date: 01/20/2024   History of Present Illness Pt is 73 yo presenting to [facility] ED on 4/29 due to worsening back rash. Pt was recently admitted to hospital secondary to catheter related UTI with sepsis. 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    PT Comments  Pt received in bed, reports that he feels like his back rash is healing but buttocks still very sore. Pt needed mod A to come to EOB, has difficulty shifting wt and then gaining balance EOB once up. Posterior bias noted. Once R foot on floor was able to sit with close supervision. Worked on amputee exercises in sitting. Pt has difficulty lifting LLE in sitting and noted to be very tight posterior L leg. Gave stretch with sheet for pt to perform in bed. Pt unable to use RLE to scoot along EOB and needs max A to scoot bkwds into bed. Patient will benefit from continued inpatient follow up therapy, <3 hours/day. PT will continue to follow.     If plan is discharge home, recommend the following: Two people to help with walking and/or transfers;Assistance with cooking/housework;Assist for transportation;Help with stairs or ramp for entrance   Can travel by private vehicle     No  Equipment Recommendations  Hoyer lift;Hospital bed (pt has hospital bed which is not adequate for needs (needs new one))    Recommendations for Other Services       Precautions / Restrictions Precautions Precautions: Fall;Other (comment) (skin) Recall of Precautions/Restrictions: Impaired Restrictions Weight Bearing Restrictions Per Provider Order: No     Mobility  Bed Mobility Overal bed mobility: Needs Assistance Bed Mobility: Supine to Sit, Sit to Supine     Supine to sit: Mod assist, +2 for safety/equipment, Used rails Sit to supine: Min assist, Used rails    General bed mobility comments: pt able to initiate coming to EOB but has difficulty wt shifting from R elbow up to sitting, needs mod A to do this and then increased time to scoot fwd and gain balance in sitting. Post bias. Min A to RLE to return to supine    Transfers Overall transfer level: Needs assistance                 General transfer comment: had long discussion about attempting partial stand or stand with stedy but pt not agreeable today. Pt unable lift buttocks during scooting to limit sheer force to wound. Needs max A to scoot with use of sheet under pt to decrease sheer    Ambulation/Gait               General Gait Details: unable   Stairs             Wheelchair Mobility     Tilt Bed    Modified Rankin (Stroke Patients Only)       Balance Overall balance assessment: Needs assistance Sitting-balance support: No upper extremity supported, Feet supported Sitting balance-[name]: Poor Sitting balance - Comments: worked on wt shifting fwd and bkwd in sitting. Also worked on leaning fwd and using RLE to scoot self bkwd into bed, pt needed max A to accomplish this.  Communication Communication Communication: No apparent difficulties  Cognition Arousal: Alert Behavior During Therapy: WFL for tasks assessed/performed   PT - Cognitive impairments: No apparent impairments                         Following commands: Intact      Cueing Cueing Techniques: Verbal cues  Exercises Amputee Exercises Hip Extension: AROM, Left, 10 reps, Seated Hip Flexion/Marching: AROM, Both, 10 reps, Seated, Supine Knee Extension: AROM, Right, 10 reps, Seated    General Comments        Pertinent Vitals/Pain Pain Assessment Pain Assessment: Faces Faces Pain Scale: Hurts even more Pain Location: buttocks with movement Pain Descriptors / Indicators: Burning, Sore Pain Intervention(s): Limited  activity within patient's tolerance, Monitored during session    Home Living                          Prior Function            PT Goals (current goals can now be found in the care plan section) Acute Rehab PT Goals Patient Stated Goal: Get a better hospital bed and get prosthetic PT Goal Formulation: With patient Time For Goal Achievement: 01/30/24 Potential to Achieve Goals: Fair Progress towards PT goals: Progressing toward goals    Frequency    Min 2X/week      PT Plan      Co-evaluation              AM-PAC PT "6 Clicks" Mobility   Outcome Measure  Help needed turning from your back to your side while in a flat bed without using bedrails?: A Lot Help needed moving from lying on your back to sitting on the side of a flat bed without using bedrails?: A Lot Help needed moving to and from a bed to a chair (including a wheelchair)?: Total Help needed standing up from a chair using your arms (e.g., wheelchair or bedside chair)?: Total Help needed to walk in hospital room?: Total Help needed climbing 3-5 steps with a railing? : Total 6 Click Score: 8    End of Session   Activity Tolerance: Patient tolerated treatment well Patient left: in bed;with bed alarm set;with call bell/phone within reach Nurse Communication: Mobility status PT Visit Diagnosis: Other abnormalities of gait and mobility (R26.89)     Time: 1096-0454 PT Time Calculation (min) (ACUTE ONLY): 26 min  Charges:    $Therapeutic Exercise: 8-22 mins $Therapeutic Activity: 8-22 mins PT General Charges $$ ACUTE PT VISIT: 1 Visit                     Amey Ka, PT  Acute Rehab Services Secure chat preferred Office [phone]    Deloris Fetters Jeison Delpilar 01/20/2024, 12:37 PM

## 2024-01-20 NOTE — Progress Notes (Signed)
 Requesting expedited appeal for denial of Skilled Nursing Facility Rehab. Waiting for a standard appeal would jeopardize patient's ability to regain maximum physical function with daily therapies.

## 2024-01-20 NOTE — TOC Progression Note (Addendum)
 Transition of Care (TOC) - Progression Note    Patient Details  Name: Micheal Moore. MRN: [id number] Date of Birth: [birth year]  Transition of Care [facility]) CM/SW Contact  Jannice Mends, LCSW Phone Number: 01/20/2024, 12:20 PM  Clinical Narrative:    CSW met with patient to discuss insurance denial after Peer to Peer. He is requesting to appeal the denial for [employer]. Patient signed authorized Rep Form. CSW spoke with [facility] appeals department [phone]) to build the case and CSW faxed clinicals to f. [phone].    Expected Discharge Plan: Skilled Nursing Facility Barriers to Discharge: Continued Medical Work up, SNF Pending bed offer, English as a second language teacher  Expected Discharge Plan and Services     Post Acute Care Choice: Skilled Nursing Facility Living arrangements for the past 2 months: Single Family Home                                       Social Determinants of Health (SDOH) Interventions SDOH Screenings   Food Insecurity: No Food Insecurity (01/15/2024)  Housing: Low Risk  (01/15/2024)  Transportation Needs: No Transportation Needs (01/15/2024)  Utilities: Not At Risk (01/15/2024)  Social Connections: Socially Isolated (01/15/2024)  Tobacco Use: High Risk (01/14/2024)    Readmission Risk Interventions    06/24/2023    3:31 PM 04/25/2023    1:04 PM 04/12/2023    4:47 PM  Readmission Risk Prevention Plan  Transportation Screening Complete Complete Complete  PCP or Specialist Appt within 3-5 Days  Complete Complete  HRI or Home Care Consult  Complete Complete  Social Work Consult for Recovery Care Planning/Counseling  Complete Complete  Palliative Care Screening  Complete Not Applicable  Medication Review Oceanographer) Complete Complete Referral to Pharmacy  PCP or Specialist appointment within 3-5 days of discharge Complete    HRI or Home Care Consult Complete    SW Recovery Care/Counseling Consult Complete    Palliative Care Screening  Complete    Skilled Nursing Facility Complete

## 2024-01-20 NOTE — Progress Notes (Signed)
 5/5 After P2P was conducted, patient was denied due to lack of medical necessity.

## 2024-01-21 DIAGNOSIS — I951 Orthostatic hypotension: Secondary | ICD-10-CM | POA: Diagnosis not present

## 2024-01-21 DIAGNOSIS — I739 Peripheral vascular disease, unspecified: Secondary | ICD-10-CM | POA: Diagnosis not present

## 2024-01-21 DIAGNOSIS — R21 Rash and other nonspecific skin eruption: Secondary | ICD-10-CM | POA: Diagnosis not present

## 2024-01-21 DIAGNOSIS — E1169 Type 2 diabetes mellitus with other specified complication: Secondary | ICD-10-CM | POA: Diagnosis not present

## 2024-01-21 LAB — COMPREHENSIVE METABOLIC PANEL WITH GFR
ALT: 14 U/L (ref 0–44)
AST: 16 U/L (ref 15–41)
Albumin: 3.3 g/dL — ABNORMAL LOW (ref 3.5–5.0)
Alkaline Phosphatase: 66 U/L (ref 38–126)
Anion gap: 9 (ref 5–15)
BUN: 22 mg/dL (ref 8–23)
CO2: 24 mmol/L (ref 22–32)
Calcium: 9.4 mg/dL (ref 8.9–10.3)
Chloride: 101 mmol/L (ref 98–111)
Creatinine, Ser: 0.67 mg/dL (ref 0.61–1.24)
GFR, Estimated: >60 mL/min (ref >60)
Glucose, Bld: 140 mg/dL — ABNORMAL HIGH (ref 70–99)
Potassium: 4.6 mmol/L (ref 3.5–5.1)
Sodium: 134 mmol/L — ABNORMAL LOW (ref 135–145)
Total Bilirubin: 0.4 mg/dL (ref 0.0–1.2)
Total Protein: 7.1 g/dL (ref 6.5–8.1)

## 2024-01-21 LAB — CBC WITH DIFFERENTIAL/PLATELET
Abs Immature Granulocytes: 0.26 10*3/uL — ABNORMAL HIGH (ref 0.00–0.07)
Basophils Absolute: 0.1 10*3/uL (ref 0.0–0.1)
Basophils Relative: 1 %
Eosinophils Absolute: 0.4 10*3/uL (ref 0.0–0.5)
Eosinophils Relative: 5 %
HCT: 37.3 % — ABNORMAL LOW (ref 39.0–52.0)
Hemoglobin: 12.2 g/dL — ABNORMAL LOW (ref 13.0–17.0)
Immature Granulocytes: 4 %
Lymphocytes Relative: 33 %
Lymphs Abs: 2.4 10*3/uL (ref 0.7–4.0)
MCH: 30.5 pg (ref 26.0–34.0)
MCHC: 32.7 g/dL (ref 30.0–36.0)
MCV: 93.3 fL (ref 80.0–100.0)
Monocytes Absolute: 0.5 10*3/uL (ref 0.1–1.0)
Monocytes Relative: 6 %
Neutro Abs: 3.7 10*3/uL (ref 1.7–7.7)
Neutrophils Relative %: 51 %
Platelets: 347 10*3/uL (ref 150–400)
RBC: 4 MIL/uL — ABNORMAL LOW (ref 4.22–5.81)
RDW: 15 % (ref 11.5–15.5)
WBC: 7.2 10*3/uL (ref 4.0–10.5)
nRBC: 0 % (ref 0.0–0.2)

## 2024-01-21 LAB — GLUCOSE, CAPILLARY
Glucose-Capillary: 135 mg/dL — ABNORMAL HIGH (ref 70–99)
Glucose-Capillary: 166 mg/dL — ABNORMAL HIGH (ref 70–99)
Glucose-Capillary: 236 mg/dL — ABNORMAL HIGH (ref 70–99)
Glucose-Capillary: 204 mg/dL — ABNORMAL HIGH (ref 70–99)

## 2024-01-21 LAB — MAGNESIUM: Magnesium: 2.2 mg/dL (ref 1.7–2.4)

## 2024-01-21 LAB — PHOSPHORUS: Phosphorus: 4.3 mg/dL (ref 2.5–4.6)

## 2024-01-21 NOTE — Progress Notes (Addendum)
 PROGRESS NOTE    [name]  YNW:[id number] DOB: 11/04/1950 DOA: 01/14/2024 PCP: Rosslyn Coons, MD   Brief Narrative:  Micheal Moore. is a 73 y.o. 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  As per the patient and patient's wife patient has been having this rash for almost 2 to 3 months while he was in the rehab after his lower extremity surgery.  Last few days the rash has acutely worsened with increasing pain and discharge and patient had been having subjective feeling of fever & chills.  He was admitted and initially placed on IV antibiotics.  CT scan of the abdomen pelvis was done and did not show any deep involvement but did show stool burden and constipation.  **Interim History Unlikely this this is cellulitis we will stop his antibiotics and monitor off; seems more fungal so added  Antifungals and Topical Medications. Since the addition of Antifungals as his rash is improving slowly and Not as painful to the patient.  Right foot was little bit colder the day before yesterday so we will obtain ABIs as pedal pulse was not able to be palpated (was able to doppler a pulse behind the ankle) and this is still pending to be done. Pt has been denied SNF placement and currently is in appeal process which can take up 72 hour for a determination.   Assessment and Plan:  Fever with Rash: Fever resolved. Rash improving slowly.Mainly involving the lower half of the back and buttock area with pain and slight oozing; Initially thought to be Cellulitis but more characteristic of Fungal Infection. Among other differentials  include contact dermatitis vs Cellulitis. Patient states he has been having this rash for almost 2 to 3 months which has acutely worsened. ? Fever being spurious as resolved and has not had any since. Changed Topicals Tx Triamcinolone :Eucerin 1:1 TID to Clotrimazole  1%. C/w Fluconazole  but changed to po. D/C'd Empiric Abx with IV Vanc and Cefepime  as unlikely Cellulitis so will monitor off Abx. Will need Hypoallergenic Bedsheets and low air loss pressure mattress. C/w Hydroxyzine  25 mg po TIDprn Itching. Blood Cx x2 showing NGTD @ 5 Days and if not improving will need Fungal Cx. WOC consulted. PT/OT recommend SNF but Insurance offered Peer to Peer which was done but has been denied to go back to SNF based on his current level of mobility.  They feel that he is dependent and would not need skilled care.  Updated the patient and he has started Appeal Process and awaiting results from appeal as this can take up to 72 hours  Uncontrolled Diabetes Mellitus Type 2:  Last hemoglobin A1c was 8.2 . Increased Semglee  to 60 units sq Daily, Sensitive Novolog  SSI AC, and increased 8 units of Novolog  TIDwm to 10 units. CTM CBGs per Protocol. CBGs ranging from 135-236.  Will need tighter blood sugar control  CAD status post PCI: Denies any chest pain. Continue Aspirin  81 mg and Atorvastatin  10 mg po 2x Weekly   Suspected PAD: Foot colder with pulse not really palpable so will Obtain ABI and pending. C/w ASA and Atorvastatin . Follow ABI results and may need Vascular  evalaution  Chronic Indwelling Foley Catheter and BPH: Recently admitted for sepsis secondary UTI.  Broad Spectrum Abx have stopped as above. Exchanged 4/10 so will need to be exchanged later this week.  Hyponatremia: Na+ is now 134. LR @ 100 mL/hr now stopped. Repeat CMP in the AM  Normocyctic Anemia: Chronic; Hgb/Hct dropped from admission but stable 12.2/37.3. Anemia Panel was checked and showed an iron level of 31, UIBC 131, TIBC 162, saturation ratios  19%, ferritin level 223, folate level 13.1 and vitamin B12 level 264. CTM for S/Sx of Bleeding; no overt bleeding noted. Repeat CBC in the AM   Hx of Orthostatic Hypotension: BP is around normal at this time but will closely observe.  Used to be on Midodrine  and Fludrocortisone  which patient has not been taking. IVF with LR at 100 mL/hr stopped. PT/OT recommending SNF; Check Orthostatic VS in the AM.  Constipation: With stool burden noted on CT Scan as well as dilated stool-filled rectum similar to prior study which may indicate chronic fecal impaction with no proximal obstruction; Enema ordered by Admitting but may need to be repeated. Will place on Bowel regimen with Senna-Docusate 1 tab po BID, Miralax  17 gram po BID, and Bisacodyl  10 mg RC Dailyprn Morderate Constipation; had another bowel movement evening of 5/525  Hypoalbuminemia: Patient's Albumin  is now 3.3. CTM and Trend and repeat CMP in the AM    DVT prophylaxis: enoxaparin  (LOVENOX ) injection 40 mg Start: 01/15/24 1000    Code Status: Full Code Family Communication: No family present @ bedside  Disposition Plan:  Level of care: Telemetry Medical Status is: Inpatient Remains inpatient appropriate because: Awaiting SNF appeal decision and will need Vascular ABI to evaluate his foot   Consultants:  None  Procedures:  As delineated as above  Antimicrobials:  Anti-infectives (From admission, onward)    Start     Dose/Rate Route Frequency Ordered Stop   01/19/24 1300  fluconazole  (DIFLUCAN ) tablet 200 mg        200 mg Oral Daily 01/19/24 1205     01/16/24 1115  fluconazole  (DIFLUCAN ) IVPB 200 mg  Status:  Discontinued       Placed in "Followed by" Linked Group   200 mg 100 mL/hr over 60 Minutes Intravenous Every 24 hours 01/15/24 1029 01/19/24 1205   01/15/24 1115  fluconazole  (DIFLUCAN ) IVPB 200 mg  Status:  Discontinued        200 mg 100 mL/hr over 60 Minutes Intravenous Every 24 hours 01/15/24 1019 01/15/24 1029    01/15/24 1115  fluconazole  (DIFLUCAN ) IVPB 400 mg       Placed in "Followed by" Linked Group   400 mg 100 mL/hr over 120 Minutes Intravenous  Once 01/15/24 1029 01/15/24 1348   01/15/24 0600  Vancomycin  (VANCOCIN ) 1,250 mg in sodium chloride  0.9 % 250 mL IVPB  Status:  Discontinued        1,250 mg 166.7 mL/hr over 90 Minutes Intravenous Every 12 hours 01/14/24 2334 01/16/24 1912   01/15/24 0200  ceFEPIme  (MAXIPIME ) 2 g in sodium chloride  0.9 % 100 mL IVPB  Status:  Discontinued        2 g 200 mL/hr over 30 Minutes Intravenous Every 8 hours 01/14/24 2334 01/16/24 1912   01/14/24 1715  vancomycin  (VANCOCIN ) IVPB 1000 mg/200 mL premix  Status:  Discontinued        1,000 mg 200 mL/hr over 60 Minutes Intravenous  Once 01/14/24 1700 01/14/24 1703   01/14/24 1715  ceFEPIme  (MAXIPIME ) 2  g in sodium chloride  0.9 % 100 mL IVPB        2 g 200 mL/hr over 30 Minutes Intravenous  Once 01/14/24 1700 01/14/24 1910   01/14/24 1715  vancomycin  (VANCOREADY) IVPB 1500 mg/300 mL        1,500 mg 150 mL/hr over 120 Minutes Intravenous  Once 01/14/24 1703 01/14/24 2037       Subjective: Seen and examined at bedside and he is resting and thinks his rash is doing better.  States his buttock is not hurting as much now.  Had a bowel movement last night.  No nausea or vomiting.  His main complaint is now that his new mattress is too low causing his foot to hit the rail.  Objective: Vitals:   01/20/24 2146 01/21/24 0623 01/21/24 0900 01/21/24 1520  BP: (!) 141/80 136/82 122/75 124/71  Pulse: (!) 109 82 98 94  Resp: 18 19    Temp: 98.3 F (36.8 C) 97.7 F (36.5 C) 98.1 F (36.7 C) 98.3 F (36.8 C)  TempSrc: Oral   Oral  SpO2: 99% 97% 100% 99%  Weight:      Height:        Intake/Output Summary (Last 24 hours) at 01/21/2024 1906 Last data filed at 01/21/2024 1826 Gross per 24 hour  Intake 120 ml  Output 1750 ml  Net -1630 ml   Filed Weights   01/14/24 1654  Weight: 81.6 kg   Examination: Physical  Exam:  Constitutional: Chronically ill-appearing elderly Caucasian male who appears in no acute distress is resting Respiratory: Diminished to auscultation bilaterally, no wheezing, rales, rhonchi or crackles. Normal respiratory effort and patient is not tachypenic. No accessory muscle use Cardiovascular: RRR, no murmurs / rubs / gallops. S1 and S2 auscultated. Has some lower extremity edema in the right leg.  Unable to palpate the pedal pulse again in the right foot Abdomen: Soft, non-tender, non-distended. Bowel sounds positive.  GU: Deferred. Musculoskeletal: Left AKA Skin: Splotchy and erythematous rash on the back is slowly improving and there is no real drainage now.  Buttocks continues to have some very's involvement of some thin frail skin but slowly improving Neurologic: CN 2-12 grossly intact with no focal deficits. Romberg sign cerebellar reflexes not assessed.  Psychiatric: Normal judgment and insight. Alert and oriented x 3.   Data Reviewed: I have personally reviewed following labs and imaging studies  CBC: Recent Labs  Lab 01/17/24 0624 01/18/24 0513 01/19/24 0729 01/20/24 0501 01/21/24 0443  WBC 5.0 5.3 5.4 6.2 7.2  NEUTROABS 2.8 3.2 2.3 3.1 3.7  HGB 10.8* 11.1* 11.9* 13.2 12.2*  HCT 33.0* 34.1* 36.4* 40.9 37.3*  MCV 92.2 92.9 92.9 94.2 93.3  PLT 221 313 315 PLATELET CLUMPS NOTED ON SMEAR, UNABLE TO ESTIMATE 347   Basic Metabolic Panel: Recent Labs  Lab 01/17/24 0624 01/18/24 0513 01/19/24 0729 01/20/24 0501 01/21/24 0443  NA 135 133* 133* 135 134*  K 4.8 4.5 4.8 4.7 4.6  CL 100 98 99 98 101  CO2 25 25 25 27 24   GLUCOSE 337* 256* 278* 194* 140*  BUN 11 15 17 23 22   CREATININE 0.74 0.65 0.67 0.85 0.67  CALCIUM  8.9 9.1 9.2 10.1 9.4  MG 2.0 2.2 2.2 2.3 2.2  PHOS 3.7 4.1 4.2 3.9 4.3   GFR: Estimated Creatinine Clearance: 94.9 mL/min (by C-G formula based on SCr of 0.67 mg/dL). Liver Function Tests: Recent Labs  Lab 01/17/24 1610 01/18/24 9604  01/19/24 5409 01/20/24 0501 01/21/24 8119  AST 15 16 17 17 16   ALT 11 14 17 18 14   ALKPHOS 59 61 63 70 66  BILITOT 0.8 0.5 0.5 0.5 0.4  PROT 6.2* 6.8 6.7 7.9 7.1  ALBUMIN  2.6* 2.8* 3.1* 3.5 3.3*   No results for input(s): "LIPASE", "AMYLASE" in the last 168 hours. No results for input(s): "AMMONIA" in the last 168 hours. Coagulation Profile: No results for input(s): "INR", "PROTIME" in the last 168 hours. Cardiac Enzymes: No results for input(s): "CKTOTAL", "CKMB", "CKMBINDEX", "TROPONINI" in the last 168 hours. BNP (last 3 results) No results for input(s): "PROBNP" in the last 8760 hours. HbA1C: No results for input(s): "HGBA1C" in the last 72 hours. CBG: Recent Labs  Lab 01/20/24 1631 01/20/24 2149 01/21/24 0735 01/21/24 1141 01/21/24 1618  GLUCAP 215* 207* 135* 166* 236*   Lipid Profile: No results for input(s): "CHOL", "HDL", "LDLCALC", "TRIG", "CHOLHDL", "LDLDIRECT" in the last 72 hours. Thyroid  Function Tests: No results for input(s): "TSH", "T4TOTAL", "FREET4", "T3FREE", "THYROIDAB" in the last 72 hours. Anemia Panel: No results for input(s): "VITAMINB12", "FOLATE", "FERRITIN", "TIBC", "IRON", "RETICCTPCT" in the last 72 hours. Sepsis Labs: Recent Labs  Lab 01/15/24 0849  LATICACIDVEN 1.3   Recent Results (from the past 240 hours)  Blood culture (routine x 2)     Status: None   Collection Time: 01/14/24  5:54 PM   Specimen: BLOOD LEFT FOREARM  Result Value Ref Range Status   Specimen Description BLOOD LEFT FOREARM  Final   Special Requests   Final    BOTTLES DRAWN AEROBIC AND ANAEROBIC Blood Culture results may not be optimal due to an inadequate volume of blood received in culture bottles   Culture   Final    NO GROWTH 5 DAYS Performed at [facility], [address], [place], [place] [address]    Report Status 01/19/2024 FINAL  Final  Blood culture (routine x 2)     Status: None   Collection Time: 01/15/24 12:01 AM   Specimen: BLOOD RIGHT HAND   Result Value Ref Range Status   Specimen Description BLOOD RIGHT HAND  Final   Special Requests   Final    BOTTLES DRAWN AEROBIC AND ANAEROBIC Blood Culture adequate volume   Culture   Final    NO GROWTH 5 DAYS Performed at [facility], [address], [place], [place] [address]    Report Status 01/20/2024 FINAL  Final    Radiology Studies: No results found.  Scheduled Meds:  aspirin  EC  81 mg Oral Daily   atorvastatin   10 mg Oral Once per day on Monday Thursday   bisacodyl   10 mg Rectal Once   Chlorhexidine  Gluconate Cloth  6 each Topical Daily   clotrimazole    Topical BID   enoxaparin  (LOVENOX ) injection  40 mg Subcutaneous Q24H   fluconazole   200 mg Oral Daily   insulin  aspart  0-9 Units Subcutaneous TID WC   insulin  aspart  10 Units Subcutaneous TID WC   insulin  glargine-yfgn  60 Units Subcutaneous QHS   polyethylene glycol  17 g Oral BID   senna-docusate  1 tablet Oral BID   Continuous Infusions:   LOS: 7 days   Aura Leeds, DO [facility] Available via Epic secure chat 7am-7pm After these hours, please refer to coverage provider listed on [email or web address] 01/21/2024, 7:06 PM

## 2024-01-21 NOTE — TOC Progression Note (Addendum)
 Transition of Care (TOC) - Progression Note    Patient Details  Name: Micheal Moore. MRN: [id number] Date of Birth: September 02, 1951  Transition of Care [facility]) CM/SW Contact  Jannice Mends, LCSW Phone Number: 01/21/2024, 8:48 AM  Clinical Narrative:    Awaiting results from SNF appeal. They have up to 72 hours for a determination.   CSW received a call from [facility] stating they did received the appeal and will be working on it.   Expected Discharge Plan: Skilled Nursing Facility Barriers to Discharge: Continued Medical Work up, SNF Pending bed offer, English as a second language teacher  Expected Discharge Plan and Services     Post Acute Care Choice: Skilled Nursing Facility Living arrangements for the past 2 months: Single Family Home                                       Social Determinants of Health (SDOH) Interventions SDOH Screenings   Food Insecurity: No Food Insecurity (01/15/2024)  Housing: Low Risk  (01/15/2024)  Transportation Needs: No Transportation Needs (01/15/2024)  Utilities: Not At Risk (01/15/2024)  Social Connections: Socially Isolated (01/15/2024)  Tobacco Use: High Risk (01/14/2024)    Readmission Risk Interventions    06/24/2023    3:31 PM 04/25/2023    1:04 PM 04/12/2023    4:47 PM  Readmission Risk Prevention Plan  Transportation Screening Complete Complete Complete  PCP or Specialist Appt within 3-5 Days  Complete Complete  HRI or Home Care Consult  Complete Complete  Social Work Consult for Recovery Care Planning/Counseling  Complete Complete  Palliative Care Screening  Complete Not Applicable  Medication Review Oceanographer) Complete Complete Referral to Pharmacy  PCP or Specialist appointment within 3-5 days of discharge Complete    HRI or Home Care Consult Complete    SW Recovery Care/Counseling Consult Complete    Palliative Care Screening Complete    Skilled Nursing Facility Complete

## 2024-01-22 ENCOUNTER — Inpatient Hospital Stay (HOSPITAL_COMMUNITY)

## 2024-01-22 DIAGNOSIS — L03312 Cellulitis of back [any part except buttock]: Secondary | ICD-10-CM | POA: Diagnosis not present

## 2024-01-22 DIAGNOSIS — I7090 Unspecified atherosclerosis: Secondary | ICD-10-CM

## 2024-01-22 LAB — GLUCOSE, CAPILLARY
Glucose-Capillary: 162 mg/dL — ABNORMAL HIGH (ref 70–99)
Glucose-Capillary: 181 mg/dL — ABNORMAL HIGH (ref 70–99)
Glucose-Capillary: 179 mg/dL — ABNORMAL HIGH (ref 70–99)

## 2024-01-22 LAB — VAS US ABI WITH/WO TBI: Right ABI: 1.16

## 2024-01-22 MED ORDER — FLUCONAZOLE 200 MG PO TABS: 200.0000 mg | ORAL_TABLET | Freq: Every day | ORAL | Status: AC

## 2024-01-22 MED ORDER — CLOTRIMAZOLE 1 % EX CREA: TOPICAL_CREAM | Freq: Two times a day (BID) | CUTANEOUS | Status: DC

## 2024-01-22 MED ORDER — HYDROXYZINE HCL 25 MG PO TABS: 25.0000 mg | ORAL_TABLET | Freq: Three times a day (TID) | ORAL | Status: DC | PRN

## 2024-01-22 NOTE — Progress Notes (Signed)
 Attempted to call report to [facility] at [phone]  Patient to go to bed 3250.  Left message with secretary to have nurse call for report when she is available.

## 2024-01-22 NOTE — Progress Notes (Signed)
 OT Cancellation Note  Patient Details Name: Micheal Moore. MRN: [id number] DOB: [birth year]   Cancelled Treatment:    Reason Eval/Treat Not Completed: (P) Patient at procedure or test/ unavailable, will return as able, at vascular  Scherry Curtis 01/22/2024, 11:51 AM

## 2024-01-22 NOTE — Progress Notes (Signed)
 Physical Therapy Treatment Patient Details Name: Micheal Moore. MRN: [id number] DOB: [birth year] Today's Date: 01/22/2024   History of Present Illness Pt is 73 yo presenting to [facility] ED on 4/29 due to worsening back rash. Pt was recently admitted to hospital secondary to catheter related UTI with sepsis. 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    PT Comments  Pt seen for PT tx with pt agreeable. Pt with flat affect, not very open to education. Pt is agreeable to sitting EOB & requires mod<>max assist for supine>sit, supervision with extra time to elevate LLE onto bed during sit>supine. Pt tolerates sitting ~7 minutes with BUE support with cuing for equal weight distribution through B hips & midline orientation to correct R lateral lean. Pt requires at least 1 UE support throughout, ideally using BUE to support himself. PT educates pt on importance of OOB mobility & participation in therapy. Pt reports he doesn't want to try to stand or get to recliner right now. Will continue to follow pt acutely & progress mobility as able.    If plan is discharge home, recommend the following: Two people to help with walking and/or transfers;Assistance with cooking/housework;Assist for transportation;Help with stairs or ramp for entrance   Can travel by private vehicle     No  Equipment Recommendations  Hoyer lift;Hospital bed    Recommendations for Other Services       Precautions / Restrictions Precautions Precautions: Fall;Other (comment) Restrictions Weight Bearing Restrictions Per Provider Order: No     Mobility  Bed Mobility Overal bed mobility: Needs Assistance Bed Mobility: Supine to Sit, Rolling Rolling: Supervision   Supine to sit: Mod assist, Max assist, HOB elevated, Used rails Sit to supine: Supervision, HOB elevated, Used rails   General bed mobility comments: Pt is able to scoot to [facility]  with bed rails.    Transfers                        Ambulation/Gait                   Stairs             Wheelchair Mobility     Tilt Bed    Modified Rankin (Stroke Patients Only)       Balance Overall balance assessment: Needs assistance Sitting-balance support: Bilateral upper extremity supported, Feet supported Sitting balance-[name]: Poor                                      Communication Communication Communication: No apparent difficulties  Cognition Arousal: Alert Behavior During Therapy: Flat affect                           PT - Cognition Comments: not super receptive of education Following commands: Intact      Cueing Cueing Techniques: Verbal cues  Exercises      General Comments        Pertinent Vitals/Pain Pain Assessment Pain Assessment: Faces Faces Pain Scale: Hurts a little bit Pain Location: generalized Pain Descriptors / Indicators: Discomfort (itching) Pain Intervention(s): Monitored during session    Home Living  Prior Function            PT Goals (current goals can now be found in the care plan section) Acute Rehab PT Goals Patient Stated Goal: Get a better hospital bed and get prosthetic PT Goal Formulation: With patient Time For Goal Achievement: 01/30/24 Potential to Achieve Goals: Fair Progress towards PT goals: Progressing toward goals    Frequency           PT Plan      Co-evaluation              AM-PAC PT "6 Clicks" Mobility   Outcome Measure  Help needed turning from your back to your side while in a flat bed without using bedrails?: A Lot Help needed moving from lying on your back to sitting on the side of a flat bed without using bedrails?: Total Help needed moving to and from a bed to a chair (including a wheelchair)?: A Lot Help needed standing up from a chair using your arms (e.g., wheelchair or bedside  chair)?: Total Help needed to walk in hospital room?: Total Help needed climbing 3-5 steps with a railing? : Total 6 Click Score: 8    End of Session   Activity Tolerance: Patient limited by fatigue (self limiting) Patient left: in bed;with call bell/phone within reach (transporter arrived to take pt off unit)   PT Visit Diagnosis: Other abnormalities of gait and mobility (R26.89);Muscle weakness (generalized) (M62.81);Difficulty in walking, not elsewhere classified (R26.2)     Time: 1610-9604 PT Time Calculation (min) (ACUTE ONLY): 19 min  Charges:    $Therapeutic Activity: 8-22 mins PT General Charges $$ ACUTE PT VISIT: 1 Visit                     [name], PT, DPT 01/22/24, 11:45 AM   [name] 01/22/2024, 11:43 AM

## 2024-01-22 NOTE — TOC Progression Note (Addendum)
 Transition of Care (TOC) - Progression Note    Patient Details  Name: Wilford Strayer. MRN: [id number] Date of Birth: 1951-09-17  Transition of Care [facility]) CM/SW Contact  Jannice Mends, LCSW Phone Number: 01/22/2024, 1:34 PM  Clinical Narrative:    1:34 PM-CSW received call from Aldine Humphreys with [facility] stating patient's case has been overturned and the SNF rehab stay has been approved, #[id number], effective until 01/27/24. CSW updated [facility] to confirm bed for today.  1:37 PM-[facility] able to accept patient today. Covering CSW to complete Discharge.    Expected Discharge Plan: Skilled Nursing Facility Barriers to Discharge: Continued Medical Work up, SNF Pending bed offer, English as a second language teacher  Expected Discharge Plan and Services     Post Acute Care Choice: Skilled Nursing Facility Living arrangements for the past 2 months: Single Family Home                                       Social Determinants of Health (SDOH) Interventions SDOH Screenings   Food Insecurity: No Food Insecurity (01/15/2024)  Housing: Low Risk  (01/15/2024)  Transportation Needs: No Transportation Needs (01/15/2024)  Utilities: Not At Risk (01/15/2024)  Social Connections: Socially Isolated (01/15/2024)  Tobacco Use: High Risk (01/14/2024)    Readmission Risk Interventions    06/24/2023    3:31 PM 04/25/2023    1:04 PM 04/12/2023    4:47 PM  Readmission Risk Prevention Plan  Transportation Screening Complete Complete Complete  PCP or Specialist Appt within 3-5 Days  Complete Complete  HRI or Home Care Consult  Complete Complete  Social Work Consult for Recovery Care Planning/Counseling  Complete Complete  Palliative Care Screening  Complete Not Applicable  Medication Review Oceanographer) Complete Complete Referral to Pharmacy  PCP or Specialist appointment within 3-5 days of discharge Complete    HRI or Home Care Consult Complete    SW Recovery Care/Counseling  Consult Complete    Palliative Care Screening Complete    Skilled Nursing Facility Complete

## 2024-01-22 NOTE — TOC Transition Note (Signed)
 Transition of Care [facility]) - Discharge Note   Patient Details  Name: Micheal Moore. MRN: [id number] Date of Birth: [birth year]  Transition of Care [facility]) CM/SW Contact:  Juliane Och, LCSW Phone Number: 01/22/2024, 2:55 PM   Clinical Narrative:     Patient will DC to: [facility] Anticipated DC date: 01/22/2024 Family notified: Aqil Wahle; Spouse; [phone] Transport by: Lyna Sandhoff   Per MD patient ready for DC to [facility]. RN to call report prior to discharge [phone]. [phone]). RN, patient, patient's family, and facility notified of DC. Discharge Summary and FL2 sent to facility. DC packet on chart. Ambulance transport requested for patient at 14:54. CSW made patient's spouse aware of possible copay from ambulance transportation, which patient's spouse expressed understanding of. CSW followed up with patient on SDOH needs (social connections) and offered resources. Patient declined CSW offer.  CSW will sign off for now as social work intervention is no longer needed. Please consult us  again if new needs arise.    Final next level of care: Skilled Nursing Facility Barriers to Discharge: Barriers Resolved   Patient Goals and CMS Choice   CMS Medicare.gov Compare Post Acute Care list provided to:: Patient Choice offered to / list presented to : Patient [facility] ownership interest in [facility].provided to:: Patient    Discharge Placement              Patient chooses bed at: [facility] Patient to be transferred to facility by: [facility] Name of family member notified: Montrelle Balzer; Spouse; [phone] Patient and family notified of of transfer: 01/22/24  Discharge Plan and Services Additional resources added to the After Visit Summary for       Post Acute Care Choice: Skilled Nursing Facility                               Social Drivers of Health (SDOH) Interventions SDOH Screenings   Food  Insecurity: No Food Insecurity (01/15/2024)  Housing: Low Risk  (01/15/2024)  Transportation Needs: No Transportation Needs (01/15/2024)  Utilities: Not At Risk (01/15/2024)  Social Connections: Socially Isolated (01/15/2024)  Tobacco Use: High Risk (01/14/2024)     Readmission Risk Interventions    06/24/2023    3:31 PM 04/25/2023    1:04 PM 04/12/2023    4:47 PM  Readmission Risk Prevention Plan  Transportation Screening Complete Complete Complete  PCP or Specialist Appt within 3-5 Days  Complete Complete  HRI or Home Care Consult  Complete Complete  Social Work Consult for Recovery Care Planning/Counseling  Complete Complete  Palliative Care Screening  Complete Not Applicable  Medication Review Oceanographer) Complete Complete Referral to Pharmacy  PCP or Specialist appointment within 3-5 days of discharge Complete    HRI or Home Care Consult Complete    SW Recovery Care/Counseling Consult Complete    Palliative Care Screening Complete    Skilled Nursing Facility Complete

## 2024-01-22 NOTE — Plan of Care (Signed)
  Problem: Education: Goal: Individualized Educational Video(s) Outcome: Adequate for Discharge   Problem: Coping: Goal: Ability to adjust to condition or change in health will improve Outcome: Adequate for Discharge   Problem: Fluid Volume: Goal: Ability to maintain a balanced intake and output will improve Outcome: Adequate for Discharge   Problem: Health Behavior/Discharge Planning: Goal: Ability to identify and utilize available resources and services will improve Outcome: Adequate for Discharge Goal: Ability to manage health-related needs will improve Outcome: Adequate for Discharge   Problem: Metabolic: Goal: Ability to maintain appropriate glucose levels will improve Outcome: Adequate for Discharge   Problem: Nutritional: Goal: Maintenance of adequate nutrition will improve Outcome: Adequate for Discharge Goal: Progress toward achieving an optimal weight will improve Outcome: Adequate for Discharge   Problem: Skin Integrity: Goal: Risk for impaired skin integrity will decrease Outcome: Adequate for Discharge   Problem: Tissue Perfusion: Goal: Adequacy of tissue perfusion will improve Outcome: Adequate for Discharge   Problem: Education: Goal: Knowledge of General Education information will improve Description: Including pain rating scale, medication(s)/side effects and non-pharmacologic comfort measures Outcome: Adequate for Discharge   Problem: Health Behavior/Discharge Planning: Goal: Ability to manage health-related needs will improve Outcome: Adequate for Discharge   Problem: Clinical Measurements: Goal: Ability to maintain clinical measurements within normal limits will improve Outcome: Adequate for Discharge Goal: Will remain free from infection Outcome: Adequate for Discharge Goal: Diagnostic test results will improve Outcome: Adequate for Discharge Goal: Respiratory complications will improve Outcome: Adequate for Discharge Goal: Cardiovascular  complication will be avoided Outcome: Adequate for Discharge   Problem: Activity: Goal: Risk for activity intolerance will decrease Outcome: Adequate for Discharge   Problem: Nutrition: Goal: Adequate nutrition will be maintained Outcome: Adequate for Discharge   Problem: Coping: Goal: Level of anxiety will decrease Outcome: Adequate for Discharge   Problem: Elimination: Goal: Will not experience complications related to bowel motility Outcome: Adequate for Discharge Goal: Will not experience complications related to urinary retention Outcome: Adequate for Discharge   Problem: Pain Managment: Goal: General experience of comfort will improve and/or be controlled Outcome: Adequate for Discharge   Problem: Safety: Goal: Ability to remain free from injury will improve Outcome: Adequate for Discharge   Problem: Skin Integrity: Goal: Risk for impaired skin integrity will decrease Outcome: Adequate for Discharge

## 2024-01-22 NOTE — TOC Progression Note (Signed)
 Transition of Care (TOC) - Progression Note    Patient Details  Name: Micheal Moore. MRN: [id number] Date of Birth: 10/07/1950  Transition of Care [facility]) CM/SW Contact  Juliane Och, LCSW Phone Number: 01/22/2024, 1:37 PM  Clinical Narrative:     1:37 PM CSW informed medical team that SNF insurance authorization appeal was approved and that [facility] has bed available today for discharge.  Expected Discharge Plan: Skilled Nursing Facility Barriers to Discharge: Continued Medical Work up, SNF Pending bed offer, English as a second language teacher  Expected Discharge Plan and Services     Post Acute Care Choice: Skilled Nursing Facility Living arrangements for the past 2 months: Single Family Home                                       Social Determinants of Health (SDOH) Interventions SDOH Screenings   Food Insecurity: No Food Insecurity (01/15/2024)  Housing: Low Risk  (01/15/2024)  Transportation Needs: No Transportation Needs (01/15/2024)  Utilities: Not At Risk (01/15/2024)  Social Connections: Socially Isolated (01/15/2024)  Tobacco Use: High Risk (01/14/2024)    Readmission Risk Interventions    06/24/2023    3:31 PM 04/25/2023    1:04 PM 04/12/2023    4:47 PM  Readmission Risk Prevention Plan  Transportation Screening Complete Complete Complete  PCP or Specialist Appt within 3-5 Days  Complete Complete  HRI or Home Care Consult  Complete Complete  Social Work Consult for Recovery Care Planning/Counseling  Complete Complete  Palliative Care Screening  Complete Not Applicable  Medication Review Oceanographer) Complete Complete Referral to Pharmacy  PCP or Specialist appointment within 3-5 days of discharge Complete    HRI or Home Care Consult Complete    SW Recovery Care/Counseling Consult Complete    Palliative Care Screening Complete    Skilled Nursing Facility Complete

## 2024-01-22 NOTE — Progress Notes (Signed)
 Spoke with wife and gave update on patient discharge.  PTAR at bedside discharging patient at this time.  All belongings packed and taken with patient at this time.  No s/s of distress or discomfort.

## 2024-01-22 NOTE — Discharge Summary (Signed)
 Physician Discharge Summary  Micheal Moore. LKG:[id number] DOB: [birth year] DOA: 01/14/2024  PCP: Rosslyn Coons, MD  Admit date: 01/14/2024 Discharge date: 01/22/2024  Admitted From: SNF Disposition:  SNF  Recommendations for Outpatient Follow-up:  Follow up with PCP in 1-2 weeks Please obtain BMP/CBC in one week  Home Health: none Equipment/Devices: none  Discharge Condition: stable CODE STATUS: Full code Diet Orders (From admission, onward)     Start     Ordered   01/14/24 2314  Diet Carb Modified Fluid consistency: Thin; Room service appropriate? Yes  Diet effective now       Question Answer Comment  Diet-HS Snack? Nothing   Calorie Level Medium 1600-2000   Fluid consistency: Thin   Room service appropriate? Yes      01/14/24 2313           Brief Narrative / Interim history: 73 year old male with CAD status post PCI April 2024, DM 2, HLD, BPH with chronic Foley catheter, left AKA comes into the hospital with worsening of a back rash.  He was recently hospitalized with sepsis due to catheter related UTI and completed Zyvox .  Apparently he had the rash for the past 2 to 3 months while he was in rehab after a lower extremity surgery, but worsening the days prior to admission.  There were initial concerns about this being cellulitis and he was admitted to the hospital and placed on IV antibiotics.  Hospital Course / Discharge diagnoses: Principal Problem:   Cellulitis Active Problems:   Orthostatic hypotension   CAD (coronary artery disease)   Type 2 diabetes mellitus with hyperlipidemia (HCC)   PVD (peripheral vascular disease) (HCC)   Hyperlipidemia   Hx of left BKA (HCC)   Chronic indwelling Foley catheter   Principal problem Rash -on his back, initially felt to be cellulitis and he was placed on IV antibiotics, but later on felt cellulitis was less likely and may have been related due to contact dermatitis versus fungal infection versus pressure from  laying on his back.  This has been going on for the past 2 to 3 months.  He is now being treated conservatively with local creams Eucerin, clotrimazole  and also on oral fluconazole  and he is improving   Active problems DM2, poorly controlled, with hyperglycemia-continue Semglee  as well as sliding scale CAD with history of PCI-continue aspirin , statin, no chest pain Suspected PAD-on right lower extremity, ABI shows normal resting right ankle-brachial index and normal toe brachial index. BPH, chronic Foley catheter -recently admitted for sepsis due to UTI.  Exchanged on 4/10.  Will need to be exchanged on 5/10 Hyponatremia-mild, stable Normocytic anemia-chronic, no bleeding History of orthostatic hypotension-used to be on midodrine  and fludrocortisone .  Essentially bedbound now Status post left AKA -has yet to be fit with a prosthesis as an outpatient, missed his appointment due to being here  Sepsis ruled out   Discharge Instructions   Allergies as of 01/22/2024   No Known Allergies      Medication List     TAKE these medications    aspirin  EC 81 MG tablet Take 1 tablet (81 mg total) by mouth daily. Swallow whole.   atorvastatin  10 MG tablet Commonly known as: LIPITOR  Take 10 mg by mouth 2 (two) times a week. Mondays and Thursday   BLOOD GLUCOSE TEST STRIPS Strp 1 each by Does not apply route 3 (three) times daily. Use as directed to check blood sugar. May dispense any manufacturer covered by patient's insurance and  fits patient's device.   clotrimazole  1 % cream Commonly known as: LOTRIMIN  Apply topically 2 (two) times daily.   Ensure Max Protein Liqd Take 330 mLs (11 oz total) by mouth 2 (two) times daily.   fluconazole  200 MG tablet Commonly known as: DIFLUCAN  Take 1 tablet (200 mg total) by mouth daily for 5 days. Start taking on: Jan 23, 2024   hydrOXYzine  25 MG tablet Commonly known as: ATARAX  Take 1 tablet (25 mg total) by mouth every 8 (eight) hours as needed  for itching. What changed:  when to take this reasons to take this   insulin  aspart 100 UNIT/ML FlexPen Commonly known as: NOVOLOG  Inject 8 Units into the skin 3 (three) times daily with meals. Only take if eating a meal AND Blood Glucose (BG) is 80 or higher.   insulin  detemir 100 UNIT/ML FlexPen Commonly known as: LEVEMIR  Inject 20 Units into the skin at bedtime. DO NOT TAKE if your blood sugars and consistently running < 120   ondansetron  4 MG tablet Commonly known as: ZOFRAN  Take 4 mg by mouth every 8 (eight) hours as needed.   Pen Needles 31G X 5 MM Misc 1 each by Does not apply route 3 (three) times daily. May dispense any manufacturer covered by patient's insurance.   polyethylene glycol powder 17 GM/SCOOP powder Commonly known as: GLYCOLAX /MIRALAX  Take 17 g by mouth daily as needed for mild constipation.   rosuvastatin  5 MG tablet Commonly known as: CRESTOR  Take 5 mg by mouth 2 (two) times a week.   tamsulosin  0.4 MG Caps capsule Commonly known as: FLOMAX  Take 1 capsule (0.4 mg total) by mouth daily after supper.        Contact information for after-discharge care     Destination     [facility] Preferred SNF .   Service: Skilled Nursing Contact information: [address] [place]  [phone]                     Consultations: none  Procedures/Studies:  VAS US  ABI WITH/WO TBI Result Date: 01/22/2024  LOWER EXTREMITY DOPPLER STUDY Patient Name:  Micheal Moore.  Date of Exam:   01/22/2024 Medical Rec #: [id number]                  Accession #:    [id number] Date of Birth: 1951/04/24                  Patient Gender: M Patient Age:   28 years Exam Location:  [facility] Procedure:      VAS US  ABI WITH/WO TBI Referring Phys: Aura Leeds --------------------------------------------------------------------------------  Indications: Peripheral artery disease. left AKA High Risk  Factors: Diabetes.  Comparison Study: Reduced pressures seen since previous exam 01/16/23. Performing Technologist: Estanislao Heimlich  Examination Guidelines: A complete evaluation includes at minimum, Doppler waveform signals and systolic blood pressure reading at the level of bilateral brachial, anterior tibial, and posterior tibial arteries, when vessel segments are accessible. Bilateral testing is considered an integral part of a complete examination. Photoelectric Plethysmograph (PPG) waveforms and toe systolic pressure readings are included as required and additional duplex testing as needed. Limited examinations for reoccurring indications may be performed as noted.  ABI Findings: +---------+------------------+-----+---------+--------+ Right    Rt Pressure (mmHg)IndexWaveform Comment  +---------+------------------+-----+---------+--------+ Brachial 141                    triphasic         +---------+------------------+-----+---------+--------+  PTA      169               1.16 triphasic         +---------+------------------+-----+---------+--------+ DP       148               1.01 triphasic         +---------+------------------+-----+---------+--------+ Great Toe117               0.80 Normal            +---------+------------------+-----+---------+--------+ +--------+------------------+-----+--------+-------+ Left    Lt Pressure (mmHg)IndexWaveformComment +--------+------------------+-----+--------+-------+ [id number]                                    +--------+------------------+-----+--------+-------+ +-------+-----------+-----------+------------+------------+ ABI/TBIToday's ABIToday's TBIPrevious ABIPrevious TBI +-------+-----------+-----------+------------+------------+ Right  1.16       0.8        1.38        1.68         +-------+-----------+-----------+------------+------------+  Summary: Right: Resting right ankle-brachial index is within normal range.  The right toe-brachial index is normal. *See table(s) above for measurements and observations.     Preliminary    CT ABDOMEN PELVIS W CONTRAST Result Date: 01/14/2024 CLINICAL DATA:  Sepsis. Evaluate for necrotizing infection in the groin and back. EXAM: CT ABDOMEN AND PELVIS WITH CONTRAST TECHNIQUE: Multidetector CT imaging of the abdomen and pelvis was performed using the standard protocol following bolus administration of intravenous contrast. RADIATION DOSE REDUCTION: This exam was performed according to the departmental dose-optimization program which includes automated exposure control, adjustment of the mA and/or kV according to patient size and/or use of iterative reconstruction technique. CONTRAST:  75mL OMNIPAQUE  IOHEXOL  350 MG/ML SOLN COMPARISON:  12/02/2023 FINDINGS: Lower chest: Emphysematous changes and scarring in the lung bases. Mild traction bronchiectasis. Hepatobiliary: No focal liver abnormality is seen. No gallstones, gallbladder wall thickening, or biliary dilatation. Pancreas: Unremarkable. No pancreatic ductal dilatation or surrounding inflammatory changes. Spleen: Normal in size without focal abnormality. Adrenals/Urinary Tract: Adrenal glands are unremarkable. Kidneys are normal, without renal calculi, focal lesion, or hydronephrosis. Bladder is decompressed with a Foley catheter. Stomach/Bowel: Stomach, small bowel, and colon are not abnormally distended. No wall thickening or inflammatory changes. Colon is diffusely stool-filled. Stool-filled rectum with dilatation to 8.3 cm diameter. This is similar to prior study and could represent chronic fecal impaction. No significant wall thickening. Appendix is normal. Vascular/Lymphatic: No significant vascular findings are present. No enlarged abdominal or pelvic lymph nodes. Reproductive: Prostate is unremarkable. Other: No abdominal wall hernia or abnormality. No abdominopelvic ascites. Musculoskeletal: Postoperative fixation of the right  hip. Left total hip arthroplasty. Degenerative changes throughout the spine. Schmorl's nodes at the inferior endplate of L2. No acute bony abnormalities. IMPRESSION: 1. Diffusely stool-filled colon. Dilated stool-filled rectum similar to previous study may indicate chronic fecal impaction. No proximal obstruction. 2. Emphysematous changes and scarring in the lung bases with mild traction bronchiectasis. 3. Aortic atherosclerosis. 4. No loculated fluid collections. No soft tissue gas to suggest necrotizing infection. Electronically Signed   By: Boyce Byes M.D.   On: 01/14/2024 19:50   DG Chest Port 1 View Result Date: 12/26/2023 CLINICAL DATA:  Fever EXAM: PORTABLE CHEST 1 VIEW COMPARISON:  11/04/2023 FINDINGS: Chronic interstitial coarsening at the lung bases with overall scar-like appearance that is stable. Some fibrosis seen at the right lung base on recent abdominal CT. There is no edema, consolidation,  effusion, or pneumothorax. Normal heart size and mediastinal contours. IMPRESSION: Pulmonary scarring without acute superimposed finding. Electronically Signed   By: Ronnette Coke M.D.   On: 12/26/2023 07:29     Subjective: - no chest pain, shortness of breath, no abdominal pain, nausea or vomiting.   Discharge Exam: BP 128/73 (BP Location: Left Arm)   Pulse 98   Temp 98.7 F (37.1 C)   Resp 19   Ht 6\' 6"  (1.981 m)   Wt 81.6 kg   SpO2 98%   BMI 20.80 kg/m   General: Pt is alert, awake, not in acute distress Cardiovascular: RRR, S1/S2 +, no rubs, no gallops Respiratory: CTA bilaterally, no wheezing, no rhonchi Abdominal: Soft, NT, ND, bowel sounds + Extremities: no edema, no cyanosis    The results of significant diagnostics from this hospitalization (including imaging, microbiology, ancillary and laboratory) are listed below for reference.     Microbiology: Recent Results (from the past 240 hours)  Blood culture (routine x 2)     Status: None   Collection Time: 01/14/24   5:54 PM   Specimen: BLOOD LEFT FOREARM  Result Value Ref Range Status   Specimen Description BLOOD LEFT FOREARM  Final   Special Requests   Final    BOTTLES DRAWN AEROBIC AND ANAEROBIC Blood Culture results may not be optimal due to an inadequate volume of blood received in culture bottles   Culture   Final    NO GROWTH 5 DAYS Performed at [facility], [address] [place], [place] [address]    Report Status 01/19/2024 FINAL  Final  Blood culture (routine x 2)     Status: None   Collection Time: 01/15/24 12:01 AM   Specimen: BLOOD RIGHT HAND  Result Value Ref Range Status   Specimen Description BLOOD RIGHT HAND  Final   Special Requests   Final    BOTTLES DRAWN AEROBIC AND ANAEROBIC Blood Culture adequate volume   Culture   Final    NO GROWTH 5 DAYS Performed at [facility], [address] [place], [place] [address]    Report Status 01/20/2024 FINAL  Final     Labs: Basic Metabolic Panel: Recent Labs  Lab 01/17/24 0624 01/18/24 0513 01/19/24 0729 01/20/24 0501 01/21/24 0443  NA 135 133* 133* 135 134*  K 4.8 4.5 4.8 4.7 4.6  CL 100 98 99 98 101  CO2 25 25 25 27 24   GLUCOSE 337* 256* 278* 194* 140*  BUN 11 15 17 23 22   CREATININE 0.74 0.65 0.67 0.85 0.67  CALCIUM  8.9 9.1 9.2 10.1 9.4  MG 2.0 2.2 2.2 2.3 2.2  PHOS 3.7 4.1 4.2 3.9 4.3   Liver Function Tests: Recent Labs  Lab 01/17/24 0624 01/18/24 0513 01/19/24 0729 01/20/24 0501 01/21/24 0443  AST 15 16 17 17 16   ALT 11 14 17 18 14   ALKPHOS 59 61 63 70 66  BILITOT 0.8 0.5 0.5 0.5 0.4  PROT 6.2* 6.8 6.7 7.9 7.1  ALBUMIN  2.6* 2.8* 3.1* 3.5 3.3*   CBC: Recent Labs  Lab 01/17/24 0624 01/18/24 0513 01/19/24 0729 01/20/24 0501 01/21/24 0443  WBC 5.0 5.3 5.4 6.2 7.2  NEUTROABS 2.8 3.2 2.3 3.1 3.7  HGB 10.8* 11.1* 11.9* 13.2 12.2*  HCT 33.0* 34.1* 36.4* 40.9 37.3*  MCV 92.2 92.9 92.9 94.2 93.3  PLT 221 313 315 PLATELET CLUMPS NOTED ON SMEAR, UNABLE TO ESTIMATE 347   CBG: Recent Labs   Lab 01/21/24 1141 01/21/24  1618 01/21/24 1950 01/22/24 0719 01/22/24 1327  GLUCAP 166* 236* 204* 162* 181*   Hgb A1c No results for input(s): "HGBA1C" in the last 72 hours. Lipid Profile No results for input(s): "CHOL", "HDL", "LDLCALC", "TRIG", "CHOLHDL", "LDLDIRECT" in the last 72 hours. Thyroid  function studies No results for input(s): "TSH", "T4TOTAL", "T3FREE", "THYROIDAB" in the last 72 hours.  Invalid input(s): "FREET3" Urinalysis    Component Value Date/Time   COLORURINE YELLOW 12/26/2023 1136   APPEARANCEUR TURBID (A) 12/26/2023 1136   APPEARANCEUR Clear 12/04/2021 1430   LABSPEC 1.025 12/26/2023 1136   PHURINE 5.0 12/26/2023 1136   GLUCOSEU NEGATIVE 12/26/2023 1136   HGBUR MODERATE (A) 12/26/2023 1136   BILIRUBINUR NEGATIVE 12/26/2023 1136   BILIRUBINUR Negative 12/04/2021 1430   KETONESUR NEGATIVE 12/26/2023 1136   PROTEINUR 100 (A) 12/26/2023 1136   NITRITE NEGATIVE 12/26/2023 1136   LEUKOCYTESUR LARGE (A) 12/26/2023 1136    FURTHER DISCHARGE INSTRUCTIONS:   Get Medicines reviewed and adjusted: Please take all your medications with you for your next visit with your Primary MD   Laboratory/radiological data: Please request your Primary MD to go over all hospital tests and procedure/radiological results at the follow up, please ask your Primary MD to get all Hospital records sent to his/her office.   In some cases, they will be blood work, cultures and biopsy results pending at the time of your discharge. Please request that your primary care M.D. goes through all the records of your hospital data and follows up on these results.   Also Note the following: If you experience worsening of your admission symptoms, develop shortness of breath, life threatening emergency, suicidal or homicidal thoughts you must seek medical attention immediately by calling 911 or calling your MD immediately  if symptoms less severe.   You must read complete  instructions/literature along with all the possible adverse reactions/side effects for all the Medicines you take and that have been prescribed to you. Take any new Medicines after you have completely understood and accpet all the possible adverse reactions/side effects.    Do not drive when taking Pain medications or sleeping medications (Benzodaizepines)   Do not take more than prescribed Pain, Sleep and Anxiety Medications. It is not advisable to combine anxiety,sleep and pain medications without talking with your primary care practitioner   Special Instructions: If you have smoked or chewed Tobacco  in the last 2 yrs please stop smoking, stop any regular Alcohol   and or any Recreational drug use.   Wear Seat belts while driving.   Please note: You were cared for by a hospitalist during your hospital stay. Once you are discharged, your primary care physician will handle any further medical issues. Please note that NO REFILLS for any discharge medications will be authorized once you are discharged, as it is imperative that you return to your primary care physician (or establish a relationship with a primary care physician if you do not have one) for your post hospital discharge needs so that they can reassess your need for medications and monitor your lab values.  Time coordinating discharge: 35 minutes  SIGNED:  Kathlen Para, MD, PhD 01/22/2024, 2:04 PM

## 2024-01-22 NOTE — Progress Notes (Deleted)
 PROGRESS NOTE  Micheal Moore. UEA:[id number] DOB: [birth year] DOA: 01/14/2024 PCP: Rosslyn Coons, MD   LOS: 8 days   Brief Narrative / Interim history: 73 year old male with CAD status post PCI April 2024, DM 2, HLD, BPH with chronic Foley catheter, left AKA comes into the hospital with worsening of a back rash.  He was recently hospitalized with sepsis due to catheter related UTI and completed Zyvox .  Apparently he had the rash for the past 2 to 3 months while he was in rehab after a lower extremity surgery, but worsening the days prior to admission.  There were initial concerns about this being cellulitis and he was admitted to the hospital and placed on IV antibiotics.  Subjective / 24h Interval events: Doing well morning, appreciates his back rash is improving.  Denies any fever or chills.  No abdominal pain, no nausea or vomiting.  Assesement and Plan: Principal Problem:   Cellulitis Active Problems:   Orthostatic hypotension   CAD (coronary artery disease)   Type 2 diabetes mellitus with hyperlipidemia (HCC)   PVD (peripheral vascular disease) (HCC)   Hyperlipidemia   Hx of left BKA (HCC)   Chronic indwelling Foley catheter  Principal problem Rash -on his back, initially felt to be cellulitis and he was placed on IV antibiotics, but later on felt cellulitis was less likely and may have been related due to contact dermatitis versus fungal infection versus pressure from laying on his back.  This has been going on for the past 2 to 3 months.  He is now being treated conservatively with local creams Eucerin, clotrimazole  and also on oral fluconazole .  Active problems DM2, poorly controlled, with hyperglycemia-continue Semglee  as well as sliding scale  CAD with history of PCI-continue aspirin , statin, no chest pain  Suspected PAD-on right lower extremity, ABI is pending.  Continue aspirin , statin  BPH, chronic Foley catheter -recently admitted for sepsis due to UTI.   Exchanged on 4/10  Hyponatremia-mild, stable  Normocytic anemia-chronic, no bleeding  History of orthostatic hypotension-used to be on midodrine  and fludrocortisone .  Essentially bedbound now  Status post left AKA -has yet to be fit with a prosthesis as an outpatient, missed his appointment due to being here  Disposition -appears stable for discharge, SNF was recommended but insurance declined.  Patient appealed and this is currently in process  Scheduled Meds:  aspirin  EC  81 mg Oral Daily   atorvastatin   10 mg Oral Once per day on Monday Thursday   bisacodyl   10 mg Rectal Once   Chlorhexidine  Gluconate Cloth  6 each Topical Daily   clotrimazole    Topical BID   enoxaparin  (LOVENOX ) injection  40 mg Subcutaneous Q24H   fluconazole   200 mg Oral Daily   insulin  aspart  0-9 Units Subcutaneous TID WC   insulin  aspart  10 Units Subcutaneous TID WC   insulin  glargine-yfgn  60 Units Subcutaneous QHS   polyethylene glycol  17 g Oral BID   senna-docusate  1 tablet Oral BID   Continuous Infusions: PRN Meds:.acetaminophen  **OR** acetaminophen , bisacodyl , hydrOXYzine   Current Outpatient Medications  Medication Instructions   aspirin  EC 81 mg, Oral, Daily, Swallow whole.   atorvastatin  (LIPITOR ) 10 mg, 2 times weekly   Ensure Max Protein (ENSURE MAX PROTEIN) LIQD 11 oz, Oral, 2 times daily   Glucose Blood (BLOOD GLUCOSE TEST STRIPS) STRP 1 each, Does not apply, 3 times daily, Use as directed to check blood sugar. May dispense any manufacturer covered by patient's insurance and  fits patient's device.   hydrOXYzine  (ATARAX ) 25 mg, 2 times daily   insulin  aspart (NOVOLOG ) 8 Units, Subcutaneous, 3 times daily with meals, Only take if eating a meal AND Blood Glucose (BG) is 80 or higher.   insulin  detemir (LEVEMIR ) 20 Units, Subcutaneous, Daily at bedtime, DO NOT TAKE if your blood sugars and consistently running < 120   Insulin  Pen Needle (PEN NEEDLES) 31G X 5 MM MISC 1 each, Does not apply, 3  times daily, May dispense any manufacturer covered by patient's insurance.   ondansetron  (ZOFRAN ) 4 mg, Every 8 hours PRN   polyethylene glycol powder (GLYCOLAX /MIRALAX ) 17 g, Oral, Daily PRN   rosuvastatin  (CRESTOR ) 5 mg, 2 times weekly   tamsulosin  (FLOMAX ) 0.4 mg, Oral, Daily after supper    Diet Orders (From admission, onward)     Start     Ordered   01/14/24 2314  Diet Carb Modified Fluid consistency: Thin; Room service appropriate? Yes  Diet effective now       Question Answer Comment  Diet-HS Snack? Nothing   Calorie Level Medium 1600-2000   Fluid consistency: Thin   Room service appropriate? Yes      01/14/24 2313            DVT prophylaxis: enoxaparin  (LOVENOX ) injection 40 mg Start: 01/15/24 1000   Lab Results  Component Value Date   PLT 347 01/21/2024      Code Status: Full Code  Family Communication: no family at bedside   Status is: Inpatient Remains inpatient appropriate because: severity of illness  Level of care: Telemetry Medical  Consultants:  none  Objective: Vitals:   01/21/24 1520 01/21/24 1951 01/22/24 0554 01/22/24 0739  BP: 124/71 118/80 137/79 128/73  Pulse: 94 90 79 98  Resp:  18 18 19   Temp: 98.3 F (36.8 C) 98 F (36.7 C) 98.7 F (37.1 C)   TempSrc: Oral Oral    SpO2: 99% 98% 94% 98%  Weight:      Height:        Intake/Output Summary (Last 24 hours) at 01/22/2024 1012 Last data filed at 01/22/2024 0600 Gross per 24 hour  Intake 120 ml  Output 2175 ml  Net -2055 ml   Wt Readings from Last 3 Encounters:  01/14/24 81.6 kg  12/26/23 88.5 kg  12/02/23 88.5 kg    Examination:  Constitutional: NAD Eyes: no scleral icterus ENMT: Mucous membranes are moist.  Neck: normal, supple Respiratory: clear to auscultation bilaterally, no wheezing, no crackles.  Cardiovascular: Regular rate and rhythm, no murmurs / rubs / gallops.  Abdomen: non distended, no tenderness. Bowel sounds positive.  Musculoskeletal: no clubbing /  cyanosis.    Data Reviewed: I have independently reviewed following labs and imaging studies   CBC Recent Labs  Lab 01/17/24 0624 01/18/24 0513 01/19/24 0729 01/20/24 0501 01/21/24 0443  WBC 5.0 5.3 5.4 6.2 7.2  HGB 10.8* 11.1* 11.9* 13.2 12.2*  HCT 33.0* 34.1* 36.4* 40.9 37.3*  PLT 221 313 315 PLATELET CLUMPS NOTED ON SMEAR, UNABLE TO ESTIMATE 347  MCV 92.2 92.9 92.9 94.2 93.3  MCH 30.2 30.2 30.4 30.4 30.5  MCHC 32.7 32.6 32.7 32.3 32.7  RDW 14.4 14.6 15.0 14.9 15.0  LYMPHSABS 1.5 1.9 2.1 2.0 2.4  MONOABS 0.4 0.0* 0.4 0.4 0.5  EOSABS 0.3 0.1 0.3 0.3 0.4  BASOSABS 0.0 0.1 0.1 0.1 0.1    Recent Labs  Lab 01/17/24 0624 01/18/24 0513 01/19/24 0729 01/20/24 0501 01/21/24 0443  NA 135 133*  133* 135 134*  K 4.8 4.5 4.8 4.7 4.6  CL 100 98 99 98 101  CO2 25 25 25 27 24   GLUCOSE 337* 256* 278* 194* 140*  BUN 11 15 17 23 22   CREATININE 0.74 0.65 0.67 0.85 0.67  CALCIUM  8.9 9.1 9.2 10.1 9.4  AST 15 16 17 17 16   ALT 11 14 17 18 14   ALKPHOS 59 61 63 70 66  BILITOT 0.8 0.5 0.5 0.5 0.4  ALBUMIN  2.6* 2.8* 3.1* 3.5 3.3*  MG 2.0 2.2 2.2 2.3 2.2    ------------------------------------------------------------------------------------------------------------------ No results for input(s): "CHOL", "HDL", "LDLCALC", "TRIG", "CHOLHDL", "LDLDIRECT" in the last 72 hours.  Lab Results  Component Value Date   HGBA1C 8.2 (H) 01/15/2024   ------------------------------------------------------------------------------------------------------------------ No results for input(s): "TSH", "T4TOTAL", "T3FREE", "THYROIDAB" in the last 72 hours.  Invalid input(s): "FREET3"  Cardiac Enzymes No results for input(s): "CKMB", "TROPONINI", "MYOGLOBIN" in the last 168 hours.  Invalid input(s): "CK" ------------------------------------------------------------------------------------------------------------------    Component Value Date/Time   BNP 79.6 04/10/2023 2255    CBG: Recent Labs   Lab 01/21/24 0735 01/21/24 1141 01/21/24 1618 01/21/24 1950 01/22/24 0719  GLUCAP 135* 166* 236* 204* 162*    Recent Results (from the past 240 hours)  Blood culture (routine x 2)     Status: None   Collection Time: 01/14/24  5:54 PM   Specimen: BLOOD LEFT FOREARM  Result Value Ref Range Status   Specimen Description BLOOD LEFT FOREARM  Final   Special Requests   Final    BOTTLES DRAWN AEROBIC AND ANAEROBIC Blood Culture results may not be optimal due to an inadequate volume of blood received in culture bottles   Culture   Final    NO GROWTH 5 DAYS Performed at [facility], [address] [place], [place] [address]    Report Status 01/19/2024 FINAL  Final  Blood culture (routine x 2)     Status: None   Collection Time: 01/15/24 12:01 AM   Specimen: BLOOD RIGHT HAND  Result Value Ref Range Status   Specimen Description BLOOD RIGHT HAND  Final   Special Requests   Final    BOTTLES DRAWN AEROBIC AND ANAEROBIC Blood Culture adequate volume   Culture   Final    NO GROWTH 5 DAYS Performed at [facility], [address] [place], [place] [address]    Report Status 01/20/2024 FINAL  Final     Radiology Studies: No results found.   Kathlen Para, MD, PhD [facility]  Between 7 am - 7 pm I am available, please contact me via Amion (for emergencies) or Securechat (non urgent messages)  Between 7 pm - 7 am I am not available, please contact night coverage MD/APP via Amion

## 2024-02-20 ENCOUNTER — Encounter (HOSPITAL_COMMUNITY): Admission: RE | Admit: 2024-02-20 | Source: Ambulatory Visit

## 2024-02-25 ENCOUNTER — Other Ambulatory Visit (HOSPITAL_COMMUNITY): Payer: Self-pay

## 2024-02-25 ENCOUNTER — Observation Stay (HOSPITAL_COMMUNITY)
Admission: EM | Admit: 2024-02-25 | Discharge: 2024-03-01 | Disposition: A | Discharge status: Home / Self Care | Attending: Emergency Medicine | Admitting: Emergency Medicine

## 2024-02-25 ENCOUNTER — Encounter (HOSPITAL_COMMUNITY): Payer: Self-pay | Admitting: Emergency Medicine

## 2024-02-25 ENCOUNTER — Other Ambulatory Visit: Payer: Self-pay

## 2024-02-25 ENCOUNTER — Emergency Department (HOSPITAL_COMMUNITY)

## 2024-02-25 DIAGNOSIS — Z7901 Long term (current) use of anticoagulants: Secondary | ICD-10-CM | POA: Diagnosis not present

## 2024-02-25 DIAGNOSIS — E619 Deficiency of nutrient element, unspecified: Secondary | ICD-10-CM | POA: Insufficient documentation

## 2024-02-25 DIAGNOSIS — N390 Urinary tract infection, site not specified: Secondary | ICD-10-CM | POA: Insufficient documentation

## 2024-02-25 DIAGNOSIS — E1165 Type 2 diabetes mellitus with hyperglycemia: Secondary | ICD-10-CM | POA: Insufficient documentation

## 2024-02-25 DIAGNOSIS — N400 Enlarged prostate without lower urinary tract symptoms: Secondary | ICD-10-CM | POA: Diagnosis not present

## 2024-02-25 DIAGNOSIS — E559 Vitamin D deficiency, unspecified: Secondary | ICD-10-CM | POA: Diagnosis not present

## 2024-02-25 DIAGNOSIS — E1169 Type 2 diabetes mellitus with other specified complication: Secondary | ICD-10-CM

## 2024-02-25 DIAGNOSIS — Z978 Presence of other specified devices: Secondary | ICD-10-CM

## 2024-02-25 DIAGNOSIS — I951 Orthostatic hypotension: Secondary | ICD-10-CM | POA: Diagnosis not present

## 2024-02-25 DIAGNOSIS — L03317 Cellulitis of buttock: Principal | ICD-10-CM

## 2024-02-25 DIAGNOSIS — Z794 Long term (current) use of insulin: Secondary | ICD-10-CM | POA: Insufficient documentation

## 2024-02-25 DIAGNOSIS — Z79899 Other long term (current) drug therapy: Secondary | ICD-10-CM | POA: Diagnosis not present

## 2024-02-25 DIAGNOSIS — F17210 Nicotine dependence, cigarettes, uncomplicated: Secondary | ICD-10-CM | POA: Diagnosis not present

## 2024-02-25 DIAGNOSIS — Z9861 Coronary angioplasty status: Secondary | ICD-10-CM | POA: Diagnosis not present

## 2024-02-25 DIAGNOSIS — Z4682 Encounter for fitting and adjustment of non-vascular catheter: Secondary | ICD-10-CM | POA: Insufficient documentation

## 2024-02-25 DIAGNOSIS — K5900 Constipation, unspecified: Secondary | ICD-10-CM | POA: Insufficient documentation

## 2024-02-25 DIAGNOSIS — A419 Sepsis, unspecified organism: Secondary | ICD-10-CM | POA: Diagnosis not present

## 2024-02-25 DIAGNOSIS — E538 Deficiency of other specified B group vitamins: Secondary | ICD-10-CM | POA: Insufficient documentation

## 2024-02-25 DIAGNOSIS — R627 Adult failure to thrive: Secondary | ICD-10-CM | POA: Diagnosis not present

## 2024-02-25 DIAGNOSIS — E1140 Type 2 diabetes mellitus with diabetic neuropathy, unspecified: Secondary | ICD-10-CM | POA: Diagnosis not present

## 2024-02-25 DIAGNOSIS — R339 Retention of urine, unspecified: Secondary | ICD-10-CM | POA: Diagnosis not present

## 2024-02-25 DIAGNOSIS — R300 Dysuria: Secondary | ICD-10-CM | POA: Diagnosis present

## 2024-02-25 LAB — CBC WITH DIFFERENTIAL/PLATELET
Abs Immature Granulocytes: 0.04 10*3/uL (ref 0.00–0.07)
Basophils Absolute: 0.1 10*3/uL (ref 0.0–0.1)
Basophils Relative: 1 %
Eosinophils Absolute: 0.2 10*3/uL (ref 0.0–0.5)
Eosinophils Relative: 1 %
HCT: 41.7 % (ref 39.0–52.0)
Hemoglobin: 13.8 g/dL (ref 13.0–17.0)
Immature Granulocytes: 0 %
Lymphocytes Relative: 10 %
Lymphs Abs: 1.2 10*3/uL (ref 0.7–4.0)
MCH: 30.5 pg (ref 26.0–34.0)
MCHC: 33.1 g/dL (ref 30.0–36.0)
MCV: 92.3 fL (ref 80.0–100.0)
Monocytes Absolute: 0.5 10*3/uL (ref 0.1–1.0)
Monocytes Relative: 4 %
Neutro Abs: 9.8 10*3/uL — ABNORMAL HIGH (ref 1.7–7.7)
Neutrophils Relative %: 84 %
Platelets: 311 10*3/uL (ref 150–400)
RBC: 4.52 MIL/uL (ref 4.22–5.81)
RDW: 13.4 % (ref 11.5–15.5)
WBC: 11.8 10*3/uL — ABNORMAL HIGH (ref 4.0–10.5)
nRBC: 0 % (ref 0.0–0.2)

## 2024-02-25 LAB — COMPREHENSIVE METABOLIC PANEL WITH GFR
ALT: 11 U/L (ref 0–44)
AST: 15 U/L (ref 15–41)
Albumin: 3.6 g/dL (ref 3.5–5.0)
Alkaline Phosphatase: 109 U/L (ref 38–126)
Anion gap: 12 (ref 5–15)
BUN: 22 mg/dL (ref 8–23)
CO2: 23 mmol/L (ref 22–32)
Calcium: 9.6 mg/dL (ref 8.9–10.3)
Chloride: 98 mmol/L (ref 98–111)
Creatinine, Ser: 1.04 mg/dL (ref 0.61–1.24)
GFR, Estimated: >60 mL/min (ref >60)
Glucose, Bld: 338 mg/dL — ABNORMAL HIGH (ref 70–99)
Potassium: 4.1 mmol/L (ref 3.5–5.1)
Sodium: 133 mmol/L — ABNORMAL LOW (ref 135–145)
Total Bilirubin: 0.9 mg/dL (ref 0.0–1.2)
Total Protein: 7.6 g/dL (ref 6.5–8.1)

## 2024-02-25 LAB — I-STAT CG4 LACTIC ACID, ED
Lactic Acid, Venous: 3.1 mmol/L (ref 0.5–1.9)
Lactic Acid, Venous: 1.5 mmol/L (ref 0.5–1.9)

## 2024-02-25 LAB — PROTIME-INR
INR: 1 (ref 0.8–1.2)
Prothrombin Time: 13.7 s (ref 11.4–15.2)

## 2024-02-25 LAB — URINALYSIS, W/ REFLEX TO CULTURE (INFECTION SUSPECTED)
Bilirubin Urine: NEGATIVE
Glucose, UA: 150 mg/dL — AB
Ketones, ur: NEGATIVE mg/dL
Nitrite: POSITIVE — AB
Protein, ur: 100 mg/dL — AB
RBC / HPF: >50 RBC/hpf (ref 0–5)
Specific Gravity, Urine: 1.02 (ref 1.005–1.030)
WBC, UA: >50 WBC/hpf (ref 0–5)
pH: 7 (ref 5.0–8.0)

## 2024-02-25 LAB — GLUCOSE, CAPILLARY
Glucose-Capillary: 434 mg/dL — ABNORMAL HIGH (ref 70–99)
Glucose-Capillary: 174 mg/dL — ABNORMAL HIGH (ref 70–99)

## 2024-02-25 MED ORDER — HYDROXYZINE HCL 25 MG PO TABS
25.0000 mg | ORAL_TABLET | Freq: Three times a day (TID) | ORAL | Status: DC | PRN
  Administered 2024-02-25 – 2024-03-01 (×11): 25 mg via ORAL
  Filled 2024-02-25 (×11): qty 1

## 2024-02-25 MED ORDER — ENOXAPARIN SODIUM 40 MG/0.4ML IJ SOSY
40.0000 mg | PREFILLED_SYRINGE | INTRAMUSCULAR | Status: DC
  Administered 2024-02-25 – 2024-02-29 (×5): 40 mg via SUBCUTANEOUS
  Filled 2024-02-25 (×5): qty 0.4

## 2024-02-25 MED ORDER — NYSTATIN 100000 UNIT/GM EX POWD
Freq: Three times a day (TID) | CUTANEOUS | Status: DC
  Filled 2024-02-25 (×2): qty 15

## 2024-02-25 MED ORDER — VANCOMYCIN HCL 1750 MG/350ML IV SOLN
1750.0000 mg | Freq: Once | INTRAVENOUS | Status: AC
  Administered 2024-02-25: 1750 mg via INTRAVENOUS
  Filled 2024-02-25: qty 350

## 2024-02-25 MED ORDER — INSULIN ASPART 100 UNIT/ML IJ SOLN
0.0000 [IU] | Freq: Every day | INTRAMUSCULAR | Status: DC
  Administered 2024-02-26: 3 [IU] via SUBCUTANEOUS
  Administered 2024-02-27 – 2024-02-28 (×2): 4 [IU] via SUBCUTANEOUS
  Administered 2024-02-29: 3 [IU] via SUBCUTANEOUS

## 2024-02-25 MED ORDER — POLYETHYLENE GLYCOL 3350 17 G PO PACK: 17.0000 g | PACK | Freq: Every day | ORAL | Status: DC | PRN

## 2024-02-25 MED ORDER — GERHARDT'S BUTT CREAM
TOPICAL_CREAM | Freq: Three times a day (TID) | CUTANEOUS | Status: DC
  Filled 2024-02-25: qty 60

## 2024-02-25 MED ORDER — ATORVASTATIN CALCIUM 10 MG PO TABS
10.0000 mg | ORAL_TABLET | ORAL | Status: DC
  Administered 2024-02-27: 10 mg via ORAL
  Filled 2024-02-25: qty 1

## 2024-02-25 MED ORDER — INSULIN GLARGINE-YFGN 100 UNIT/ML ~~LOC~~ SOLN
14.0000 [IU] | Freq: Every day | SUBCUTANEOUS | Status: DC
  Filled 2024-02-25: qty 0.14

## 2024-02-25 MED ORDER — FLUCONAZOLE 150 MG PO TABS
150.0000 mg | ORAL_TABLET | Freq: Once | ORAL | Status: AC
  Administered 2024-02-25: 150 mg via ORAL
  Filled 2024-02-25: qty 1

## 2024-02-25 MED ORDER — INSULIN GLARGINE-YFGN 100 UNIT/ML ~~LOC~~ SOPN
14.0000 [IU] | PEN_INJECTOR | Freq: Once | SUBCUTANEOUS | Status: DC
  Filled 2024-02-25: qty 3

## 2024-02-25 MED ORDER — OXYCODONE HCL 5 MG PO TABS
5.0000 mg | ORAL_TABLET | ORAL | Status: DC | PRN
  Administered 2024-02-26 (×2): 5 mg via ORAL
  Filled 2024-02-25 (×2): qty 1

## 2024-02-25 MED ORDER — ASPIRIN 81 MG PO TBEC
81.0000 mg | DELAYED_RELEASE_TABLET | Freq: Every day | ORAL | Status: DC
  Administered 2024-02-25 – 2024-03-01 (×6): 81 mg via ORAL
  Filled 2024-02-25 (×6): qty 1

## 2024-02-25 MED ORDER — ACETAMINOPHEN 325 MG PO TABS: 650.0000 mg | ORAL_TABLET | Freq: Four times a day (QID) | ORAL | Status: DC | PRN

## 2024-02-25 MED ORDER — SODIUM CHLORIDE 0.9 % IV SOLN: INTRAVENOUS | Status: DC

## 2024-02-25 MED ORDER — INSULIN GLARGINE-YFGN 100 UNIT/ML ~~LOC~~ SOLN
20.0000 [IU] | Freq: Every day | SUBCUTANEOUS | Status: DC
  Administered 2024-02-26 – 2024-02-27 (×2): 20 [IU] via SUBCUTANEOUS
  Filled 2024-02-25 (×3): qty 0.2

## 2024-02-25 MED ORDER — INSULIN GLARGINE-YFGN 100 UNIT/ML ~~LOC~~ SOLN
14.0000 [IU] | Freq: Once | SUBCUTANEOUS | Status: AC
  Administered 2024-02-25: 14 [IU] via SUBCUTANEOUS
  Filled 2024-02-25: qty 0.14

## 2024-02-25 MED ORDER — INSULIN ASPART 100 UNIT/ML IJ SOLN
0.0000 [IU] | Freq: Three times a day (TID) | INTRAMUSCULAR | Status: DC
  Administered 2024-02-26 (×2): 8 [IU] via SUBCUTANEOUS
  Administered 2024-02-26: 15 [IU] via SUBCUTANEOUS
  Administered 2024-02-27: 8 [IU] via SUBCUTANEOUS
  Administered 2024-02-27: 11 [IU] via SUBCUTANEOUS
  Administered 2024-02-27 – 2024-02-28 (×2): 8 [IU] via SUBCUTANEOUS
  Administered 2024-02-28: 11 [IU] via SUBCUTANEOUS
  Administered 2024-02-28: 8 [IU] via SUBCUTANEOUS
  Administered 2024-02-29 (×2): 5 [IU] via SUBCUTANEOUS
  Administered 2024-02-29: 11 [IU] via SUBCUTANEOUS
  Administered 2024-03-01 (×2): 8 [IU] via SUBCUTANEOUS

## 2024-02-25 MED ORDER — LORAZEPAM 2 MG/ML IJ SOLN
1.0000 mg | Freq: Once | INTRAMUSCULAR | Status: AC
  Administered 2024-02-25: 1 mg via INTRAVENOUS
  Filled 2024-02-25: qty 1

## 2024-02-25 MED ORDER — INSULIN GLARGINE-YFGN 100 UNIT/ML ~~LOC~~ SOPN
14.0000 [IU] | PEN_INJECTOR | Freq: Every day | SUBCUTANEOUS | Status: DC
  Filled 2024-02-25: qty 3

## 2024-02-25 MED ORDER — ENSURE MAX PROTEIN PO LIQD
11.0000 [oz_av] | Freq: Two times a day (BID) | ORAL | Status: DC
  Administered 2024-02-25 – 2024-03-01 (×10): 11 [oz_av] via ORAL
  Filled 2024-02-25 (×12): qty 330

## 2024-02-25 MED ORDER — ACETAMINOPHEN 650 MG RE SUPP
650.0000 mg | Freq: Four times a day (QID) | RECTAL | Status: DC | PRN
Start: 2024-02-25 — End: 2024-03-01

## 2024-02-25 MED ORDER — SODIUM CHLORIDE 0.9 % IV SOLN
2.0000 g | Freq: Once | INTRAVENOUS | Status: AC
  Administered 2024-02-25: 2 g via INTRAVENOUS
  Filled 2024-02-25: qty 12.5

## 2024-02-25 MED ORDER — ONDANSETRON HCL 4 MG PO TABS: 4.0000 mg | ORAL_TABLET | Freq: Three times a day (TID) | ORAL | Status: DC | PRN

## 2024-02-25 MED ORDER — INSULIN ASPART 100 UNIT/ML IJ SOLN
24.0000 [IU] | Freq: Once | INTRAMUSCULAR | Status: AC
  Administered 2024-02-25: 24 [IU] via SUBCUTANEOUS

## 2024-02-25 NOTE — ED Notes (Signed)
 New UA sample sent to lab at 1138.

## 2024-02-25 NOTE — Consult Note (Addendum)
 WOC Nurse Consult Note: this patient is familiar to WOC team from multiple admissions with 3-4 months duration of back/buttocks ?fungal rash vs MASD  Reason for Consult:MASD groin/buttocks  Wound type: full and partial thickness skin loss related to moisture and friction  Pressure Injury POA: NA  Measurement: widespread to buttocks and ischium/groin  Wound bed: erythema with partial and full thickness  Drainage (amount, consistency, odor) per nursing flowsheet  Periwound: erythema  Dressing procedure/placement/frequency:  Cleanse buttocks/ischium/groin with Vashe wound cleanser Timm Foot [phone] do not rinse and allow to air dry.  Apply Gerhardt's Butt cream to area 3 times a day and prn soiling.   POC discussed with bedside nurse. Patient may benefit from dermatology referral as outpatient for more definitive diagnosis of this ongoing skin condition.  WOC team will not follow. Re-consult if further needs arise.   Thank you,    Ronni Colace MSN, RN-BC, [employer] [phone]

## 2024-02-25 NOTE — Plan of Care (Signed)
 Pt alert and oriented x4. At bedrest. Repositions himself in the bed. Skin warm and dry.redness noted to his groin and lower back. Foley intact and patent with yellow urine. IVF's infusing. Medicated for c/o itching to back. Pt stated that the foley was changed earlier today, in the ED. He yells with urination, but has declined pain med. Encouraged to call for assistance as needed. Call light in reach. Bed in low position.

## 2024-02-25 NOTE — ED Notes (Signed)
 Catheter flushed after this nurse noticed urine coming around the catheter.

## 2024-02-25 NOTE — TOC CM/SW Note (Signed)
 Transition of Care [facility]) - Inpatient Brief Assessment   Patient Details  Name: Micheal Moore. MRN: [id number] Date of Birth: Dec 15, 1950  [facility] of [facility]) CM/SW Contact:    Juliane Och, LCSW Phone Number: 02/25/2024, 4:20 PM   Clinical Narrative:  4:20 PM Per chart review, patient resides at home with spouse. Patient was recently discharge home from [facility]. Patient has HH histroy with [facility], [facility], and [facility]. Patient has DME history (hospital bed, wheelchair, Gel overlay, rolling walker) with [facility]. Patient's preferred pharmacy's are [facility]  and [facility]. TOC will continue to follow and be available to assist.  Transition of Care Asessment: Insurance and Status: Insurance coverage has been reviewed Patient has primary care physician: No (Followed by Cardiology) Home environment has been reviewed: Private Residence Prior level of function:: N/A Prior/Current Home Services: No current home services (Has HH/DME history) Social Drivers of Health Review: SDOH reviewed no interventions necessary Readmission risk has been reviewed: Yes Transition of care needs: no transition of care needs at this time

## 2024-02-25 NOTE — Care Management Obs Status (Signed)
 MEDICARE OBSERVATION STATUS NOTIFICATION   Patient Details  Name: Micheal Moore. MRN: [id number] Date of Birth: 02-07-1951   Medicare Observation Status Notification Given:  Yes  Letter signed and copy given  Wynonia Hedges 02/25/2024, 2:56 PM

## 2024-02-25 NOTE — H&P (Addendum)
 ADMISSION HISTORY AND PHYSICAL   [name] AOZ:[id number] DOB: 1951-08-31 DOA: 02/25/2024  PCP: Pcp, No Patient coming from: home via [facility] ED  Chief Complaint: dysuria w/ urinary retention   HPI:  73 year old with a history of CAD status post PCI April 2024, DM2, HLD, BPH with chronic Foley catheter, and PVD status post left AKA who has suffered multiple recent hospitalizations.  He was most recently admitted to the hospital 4/29-01/22/2024 for treatment of a severe rash on his back that was ultimately felt to be due to contact dermatitis versus tinea corporis.  Following that admission he was discharged to a SNF for a rehab stay.  He was recently discharged home from his SNF because he "ran out of time" per his report.  History suggest that he laid in bed without much care at home.  EMS was summoned to transport the patient to the ER today because of severe persisting buttock and groin pain along with leaking of urine around his Foley catheter and a sensation of urinary retention.  In the emergency room he was found to have severe moisture associated skin damage to his groin and buttocks bilaterally with extensive but superficial skin breakdown.  He was also noted to be covered in stool and fetid urine.  It appeared that he had not moved from bed and had been left in his own excrement for some time.  Assessment/Plan  BPH with chronic Foley catheter and acute Foley dysfunction/urinary retention Foley catheter was flushed in the ER and appears to be functioning for now -it should be changed every month and therefore I will asked that it be changed tomorrow morning  Severe moisture associated skin damage groin and buttocks WOC to be consulted for suggested wound care - will dose w/ systemic antifungal and augment with topical agent as well   Lactic acidosis and tachycardia POA Likely reflective of simple significant volume depletion -lactate improving with hydration-monitor with  ongoing volume resuscitation  Abnormal UA Given persistent Foley catheter and the patient's condition at the time of his admission it is not clear if his positive UA is simply indicative of a contaminated catheter or a true active infection -he is afebrile -he was dosed with cefepime  and vancomycin  in the ER -I will withhold further antibiotics for now and we will follow-up his urine culture  Uncontrolled DM2 Monitor CBG closely w/ SSI  CAD with history of PCI 2024 Asymptomatic at present  Prior history of orthostatic hypotension Previously required midodrine  and Florinef  -no longer being dosed as patient is now essentially bedbound  Severe failure to thrive It would appear the patient's home environment is not safe for him -we will need to investigate other options for placement when he is medically stable for discharge but   DVT prophylaxis: lovenox  Code Status:   Code Status: Full Code Family Communication: No family present at time of exam Disposition Plan:  Admit to Inpatient   Review of Systems: As per HPI otherwise 10 point review of systems negative.   Past Medical History:  Diagnosis Date   Cancer [facility])    right elbow melanoma   Cellulitis in diabetic foot (HCC) 04/27/2020   Diabetes mellitus without complication (HCC)    Type II   Diabetic ketoacidosis without coma associated with type 2 diabetes mellitus (HCC) 01/15/2023   Dizziness    DVT (deep venous thrombosis) (HCC)    right leg   Elevated troponin 04/11/2023   Fall    Frequent falls  Hip fracture (HCC) 02/22/2022   Hypokalemia 04/11/2023   Near syncope    Osteomyelitis (HCC) 06/19/2023   Osteomyelitis of great toe of left foot (HCC) 04/27/2020   Syncope     Past Surgical History:  Procedure Laterality Date   AMPUTATION Left 06/28/2023   Procedure: LEFT ABOVE KNEE AMPUTATION;  Surgeon: Timothy Ford, MD;  Location: [facility] OR;  Service: Orthopedics;  Laterality: Left;   AMPUTATION TOE Left 04/28/2020    Procedure: AMPUTATION LEFT GREAT TOE;  Surgeon: Timothy Ford, MD;  Location: [facility] OR;  Service: Orthopedics;  Laterality: Left;   CORONARY/GRAFT ACUTE MI REVASCULARIZATION N/A 01/15/2023   Procedure: Coronary/Graft Acute MI Revascularization;  Surgeon: Kyra Phy, MD;  Location: [facility] INVASIVE CV LAB;  Service: Cardiovascular;  Laterality: N/A;   INTRAMEDULLARY (IM) NAIL INTERTROCHANTERIC Right 02/23/2022   Procedure: INTRAMEDULLARY (IM) NAIL INTERTROCHANTRIC, RIGHT;  Surgeon: Adonica Hoose, MD;  Location: [facility] ORS;  Service: Orthopedics;  Laterality: Right;   LEFT HEART CATH AND CORONARY ANGIOGRAPHY N/A 01/15/2023   Procedure: LEFT HEART CATH AND CORONARY ANGIOGRAPHY;  Surgeon: Kyra Phy, MD;  Location: [facility] INVASIVE CV LAB;  Service: Cardiovascular;  Laterality: N/A;   LOWER EXTREMITY ANGIOGRAPHY Bilateral 01/18/2023   Procedure: Lower Extremity Angiography;  Surgeon: Margherita Shell, MD;  Location: [facility] INVASIVE CV LAB;  Service: Cardiovascular;  Laterality: Bilateral;   MELANOMA EXCISION WITH SENTINEL LYMPH NODE BIOPSY Right 08/19/2020   Procedure: WIDE LOCAL EXCISION RIGHT ELBOW MELANOMA WITH RIGHT AXILLARY SENTINEL LYMPH NODE BIOPSY;  Surgeon: Lujean Sake, MD;  Location: [facility] OR;  Service: General;  Laterality: Right;  2nd incision in axilla   TEE WITHOUT CARDIOVERSION N/A 04/19/2023   Procedure: TRANSESOPHAGEAL ECHOCARDIOGRAM;  Surgeon: Wendie Hamburg, MD;  Location: [facility] INVASIVE CV LAB;  Service: Cardiovascular;  Laterality: N/A;   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   Procedure: TOTAL HIP ARTHROPLASTY ANTERIOR APPROACH;  Surgeon: Adonica Hoose, MD;  Location: [facility] ORS;  Service: Orthopedics;  Laterality: Left;   TOTAL HIP ARTHROPLASTY Left 08/24/2022   Procedure: OPEN REDUCTION, HEAD AND LINER EXCHANGE;  Surgeon: Adonica Hoose, MD;  Location: [facility] ORS;  Service: Orthopedics;  Laterality: Left;    Family History  Family History  Problem Relation Age of Onset   Stroke Mother     Diabetes Father    Stroke Sister    Diabetes Sister     Social History   reports that he has been smoking cigarettes. He has a 3.8 pack-year smoking history. He has never used smokeless tobacco. He reports that he does not currently use drugs. He reports that he does not drink alcohol .  Allergies No Known Allergies  Prior to Admission medications   Medication Sig Start Date End Date Taking? Authorizing Provider  aspirin  EC 81 MG tablet Take 1 tablet (81 mg total) by mouth daily. Swallow whole. Patient not taking: Reported on 01/14/2024 01/20/23   Lesa Rape, MD  atorvastatin  (LIPITOR ) 10 MG tablet Take 10 mg by mouth 2 (two) times a week. Mondays and Thursday Patient not taking: Reported on 01/14/2024    [provider]  clotrimazole  (LOTRIMIN ) 1 % cream Apply topically 2 (two) times daily. 01/22/24   Osborn Blaze, MD  Ensure Max Protein (ENSURE MAX PROTEIN) LIQD Take 330 mLs (11 oz total) by mouth 2 (two) times daily. Patient not taking: Reported on 12/26/2023 08/28/22   Hoyt Macleod, MD  Glucose Blood (BLOOD GLUCOSE TEST STRIPS) STRP 1 each by Does not apply route 3 (three)  times daily. Use as directed to check blood sugar. May dispense any manufacturer covered by patient's insurance and fits patient's device. 01/20/23   Lesa Rape, MD  hydrOXYzine  (ATARAX ) 25 MG tablet Take 1 tablet (25 mg total) by mouth every 8 (eight) hours as needed for itching. 01/22/24   Gherghe, Costin M, MD  insulin  aspart (NOVOLOG ) 100 UNIT/ML FlexPen Inject 8 Units into the skin 3 (three) times daily with meals. Only take if eating a meal AND Blood Glucose (BG) is 80 or higher. 04/22/23 01/13/25  Abbe Abate, MD  insulin  detemir (LEVEMIR ) 100 UNIT/ML FlexPen Inject 20 Units into the skin at bedtime. DO NOT TAKE if your blood sugars and consistently running < 120 04/23/23   Abbe Abate, MD  Insulin  Pen Needle (PEN NEEDLES) 31G X 5 MM MISC 1 each by Does not apply route 3 (three) times daily. May  dispense any manufacturer covered by patient's insurance. 01/20/23   Lesa Rape, MD  ondansetron  (ZOFRAN ) 4 MG tablet Take 4 mg by mouth every 8 (eight) hours as needed. Patient not taking: Reported on 01/14/2024 11/13/23   [provider]  polyethylene glycol powder (GLYCOLAX /MIRALAX ) 17 GM/SCOOP powder Take 17 g by mouth daily as needed for mild constipation. 12/31/23   Trenton Frock, MD  rosuvastatin  (CRESTOR ) 5 MG tablet Take 5 mg by mouth 2 (two) times a week. Patient not taking: Reported on 01/14/2024 11/13/23   [provider]  tamsulosin  (FLOMAX ) 0.4 MG CAPS capsule Take 1 capsule (0.4 mg total) by mouth daily after supper. Patient not taking: Reported on 01/14/2024 06/30/23   Maggie Schooner, MD    Physical Exam: Vitals:   02/25/24 1130 02/25/24 1145 02/25/24 1152 02/25/24 1400  BP: 127/70 (!) 169/143  126/82  Pulse:  (!) 119  (!) 108  Resp: 17 20  20   Temp:   98.4 F (36.9 C) 97.8 F (36.6 C)  TempSrc:   Oral Axillary  SpO2:  100%  98%  Weight:      Height:        Constitutional: NAD, calm, comfortable Respiratory: clear to auscultation bilaterally, no wheezing, no crackles. Normal respiratory effort. No accessory muscle use.  Cardiovascular: Regular rate and rhythm, no murmurs / rubs / gallops. No extremity edema. 2+ pedal pulses.  Abdomen: No tenderness or masses to palpation. No hepatosplenomegaly. Bowel sounds positive. Not distended. Soft.  Groin/Buttocks: Severe moisture associated skin damage and superficial erythema with sloughing of superficial skin layers noted across bilateral buttocks extending down into the groin bilaterally with no appreciable ulceration or abscess at present Neurologic: CN 2-12 grossly intact B. Sensation intact. Strength 5/5 in all 4 extremities.  Psychiatric: Normal judgment and insight. Alert and oriented x 3. Normal mood.    Labs on Admission:   CBC: Recent Labs  Lab 02/25/24 0859  WBC 11.8*  NEUTROABS 9.8*  HGB 13.8   HCT 41.7  MCV 92.3  PLT 311   Basic Metabolic Panel: Recent Labs  Lab 02/25/24 0859  NA 133*  K 4.1  CL 98  CO2 23  GLUCOSE 338*  BUN 22  CREATININE 1.04  CALCIUM  9.6   GFR: Estimated Creatinine Clearance: 71 mL/min (by C-G formula based on SCr of 1.04 mg/dL).  Liver Function Tests: Recent Labs  Lab 02/25/24 0859  AST 15  ALT 11  ALKPHOS 109  BILITOT 0.9  PROT 7.6  ALBUMIN  3.6    Coagulation Profile: Recent Labs  Lab 02/25/24 0859  INR 1.0  Urine analysis:    Component Value Date/Time   COLORURINE YELLOW 02/25/2024 1139   APPEARANCEUR TURBID (A) 02/25/2024 1139   APPEARANCEUR Clear 12/04/2021 1430   LABSPEC 1.020 02/25/2024 1139   PHURINE 7.0 02/25/2024 1139   GLUCOSEU 150 (A) 02/25/2024 1139   HGBUR SMALL (A) 02/25/2024 1139   BILIRUBINUR NEGATIVE 02/25/2024 1139   BILIRUBINUR Negative 12/04/2021 1430   KETONESUR NEGATIVE 02/25/2024 1139   PROTEINUR 100 (A) 02/25/2024 1139   NITRITE POSITIVE (A) 02/25/2024 1139   LEUKOCYTESUR MODERATE (A) 02/25/2024 1139    Radiological Exams on Admission: DG Chest Port 1 View Result Date: 02/25/2024 IMPRESSION: Low lung volumes with patchy bibasilar opacities favoring atelectasis. No definite evidence of pneumonia. Electronically Signed   By: Elmon Hagedorn M.D.   On: 02/25/2024 09:20    Abbe Abate, MD [facility] Office  [phone] Pager - Text Page per Amion as per below:  On-Call/Text Page:      [email or web address]  If 7PM-7AM, please contact night-coverage [email or web address] 02/25/2024, 4:55 PM

## 2024-02-25 NOTE — Progress Notes (Signed)
 ED Pharmacy Antibiotic Sign Off An antibiotic consult was received from an ED provider for vancomycin  and cefepime  per pharmacy dosing for wound infection. A chart review was completed to assess appropriateness.   The following one time order(s) were placed:  Cefepime  2g Vancomycin  1750mg   Further antibiotic and/or antibiotic pharmacy consults should be ordered by the admitting provider if indicated.   Thank you for allowing pharmacy to be a part of this patient's care.   Fonda Hymen, [facility]  Clinical Pharmacist 02/25/24 10:34 AM

## 2024-02-25 NOTE — ED Notes (Signed)
 Called lab about UA sent at 0935 that has not resulted at this time. Lab states that the UA sample had no label on it and discarded the sample. This nurse will recollect and send the sample.

## 2024-02-25 NOTE — ED Provider Notes (Signed)
 [facility] Provider Note   CSN: [id number] Arrival date & time: 02/25/24  4098     History  Chief Complaint  Patient presents with   Urinary Retention   Groin Pain    Micheal Moore. is a 73 y.o. male.  73 year old male with a past medical history of DVT, DM, Osteomyelitis presents to the ED via EMS from home with urinary retention and groin pain.  Patient is coming in from home, reports he was recently discharged from a SNF due to they ran out of time .  He is endorsing pain along his buttocks, this is covered in a type I pressure ulcer at this time.  In addition, he reports his urine catheter is no longer draining, he is having waxing and waning spasms to his bladder which are quite painful.  He reports his wife along with son-in-law help take care of him, however there is not much moving around in the home.  He arrived to the ED tachycardic with a heart rate in the 140s.  He denies any fever, nausea, vomiting, chest pain.  The history is provided by the patient and the EMS personnel.  Groin Pain Pertinent negatives include no chest pain, no abdominal pain and no shortness of breath.       Home Medications Prior to Admission medications   Medication Sig Start Date End Date Taking? Authorizing Provider  aspirin  EC 81 MG tablet Take 1 tablet (81 mg total) by mouth daily. Swallow whole. Patient not taking: Reported on 01/14/2024 01/20/23   Lesa Rape, MD  atorvastatin  (LIPITOR ) 10 MG tablet Take 10 mg by mouth 2 (two) times a week. Mondays and Thursday Patient not taking: Reported on 01/14/2024    [provider]  clotrimazole  (LOTRIMIN ) 1 % cream Apply topically 2 (two) times daily. 01/22/24   Osborn Blaze, MD  Ensure Max Protein (ENSURE MAX PROTEIN) LIQD Take 330 mLs (11 oz total) by mouth 2 (two) times daily. Patient not taking: Reported on 12/26/2023 08/28/22   Hoyt Macleod, MD  Glucose Blood (BLOOD GLUCOSE TEST  STRIPS) STRP 1 each by Does not apply route 3 (three) times daily. Use as directed to check blood sugar. May dispense any manufacturer covered by patient's insurance and fits patient's device. 01/20/23   Lesa Rape, MD  hydrOXYzine  (ATARAX ) 25 MG tablet Take 1 tablet (25 mg total) by mouth every 8 (eight) hours as needed for itching. 01/22/24   Gherghe, Costin M, MD  insulin  aspart (NOVOLOG ) 100 UNIT/ML FlexPen Inject 8 Units into the skin 3 (three) times daily with meals. Only take if eating a meal AND Blood Glucose (BG) is 80 or higher. 04/22/23 01/13/25  Abbe Abate, MD  insulin  detemir (LEVEMIR ) 100 UNIT/ML FlexPen Inject 20 Units into the skin at bedtime. DO NOT TAKE if your blood sugars and consistently running < 120 04/23/23   Abbe Abate, MD  Insulin  Pen Needle (PEN NEEDLES) 31G X 5 MM MISC 1 each by Does not apply route 3 (three) times daily. May dispense any manufacturer covered by patient's insurance. 01/20/23   Lesa Rape, MD  ondansetron  (ZOFRAN ) 4 MG tablet Take 4 mg by mouth every 8 (eight) hours as needed. Patient not taking: Reported on 01/14/2024 11/13/23   [provider]  polyethylene glycol powder (GLYCOLAX /MIRALAX ) 17 GM/SCOOP powder Take 17 g by mouth daily as needed for mild constipation. 12/31/23   Trenton Frock, MD  rosuvastatin  (CRESTOR ) 5  MG tablet Take 5 mg by mouth 2 (two) times a week. Patient not taking: Reported on 01/14/2024 11/13/23   [provider]  tamsulosin  (FLOMAX ) 0.4 MG CAPS capsule Take 1 capsule (0.4 mg total) by mouth daily after supper. Patient not taking: Reported on 01/14/2024 06/30/23   Maggie Schooner, MD      Allergies    Patient has no known allergies.    Review of Systems   Review of Systems  Constitutional:  Negative for fever.  HENT:  Negative for sinus pressure.   Respiratory:  Negative for shortness of breath.   Cardiovascular:  Negative for chest pain.  Gastrointestinal:  Negative for abdominal pain.  Skin:  Positive  for wound.       Erythema, skin breakdown to the lower back, buttocks, lower leg, and right shin.  All other systems reviewed and are negative.   Physical Exam Updated Vital Signs BP 118/63   Pulse (!) 111   Temp 98.4 F (36.9 C) (Oral)   Resp (!) 22   Ht 6' 5 (1.956 m)   Wt 79.4 kg   SpO2 100%   BMI 20.75 kg/m  Physical Exam Vitals and nursing note reviewed.  Constitutional:      Appearance: He is ill-appearing.  HENT:     Head: Normocephalic.     Mouth/Throat:     Mouth: Mucous membranes are dry.  Cardiovascular:     Rate and Rhythm: Tachycardia present.     Pulses:          Dorsalis pedis pulses are detected w/ Doppler on the right side.  Pulmonary:     Effort: Pulmonary effort is normal.     Breath sounds: No wheezing or rales.  Abdominal:     General: Abdomen is flat.     Palpations: Abdomen is soft.     Tenderness: There is no abdominal tenderness.  Musculoskeletal:     Cervical back: Normal range of motion and neck supple.     Left Lower Extremity: Left leg is amputated above knee.  Feet:     Right foot:     Skin integrity: Skin breakdown and erythema present.  Skin:    General: Skin is warm.     Findings: Erythema present.       Neurological:     Mental Status: He is alert and oriented to person, place, and time.          ED Results / Procedures / Treatments   Labs (all labs ordered are listed, but only abnormal results are displayed) Labs Reviewed  COMPREHENSIVE METABOLIC PANEL WITH GFR - Abnormal; Notable for the following components:      Result Value   Sodium 133 (*)    Glucose, Bld 338 (*)    All other components within normal limits  CBC WITH DIFFERENTIAL/PLATELET - Abnormal; Notable for the following components:   WBC 11.8 (*)    Neutro Abs 9.8 (*)    All other components within normal limits  I-STAT CG4 LACTIC ACID, ED - Abnormal; Notable for the following components:   Lactic Acid, Venous 3.1 (*)    All other components within  normal limits  CULTURE, BLOOD (ROUTINE X 2)  CULTURE, BLOOD (ROUTINE X 2)  PROTIME-INR  URINALYSIS, W/ REFLEX TO CULTURE (INFECTION SUSPECTED)  I-STAT CG4 LACTIC ACID, ED    EKG EKG Interpretation Date/Time:  Tuesday February 25 2024 09:00:12 EDT Ventricular Rate:  124 PR Interval:  144 QRS Duration:  93 QT  Interval:  339 QTC Calculation: 487 R Axis:   2  Text Interpretation: Sinus tachycardia Abnormal R-wave progression, early transition Borderline repolarization abnormality Borderline prolonged QT interval Confirmed by Hiawatha Lout ([id number]) on 02/25/2024 9:22:55 AM  Radiology DG Chest Port 1 View Result Date: 02/25/2024 CLINICAL DATA:  Questionable sepsis.  Evaluate for abnormality. EXAM: PORTABLE CHEST 1 VIEW COMPARISON:  Radiographs 12/26/2023 and 11/03/2023.  CT 10/01/2022. FINDINGS: 0910 hours. Moderately lower lung volumes with patchy bibasilar opacities favoring atelectasis. There is no confluent airspace disease, significant pleural effusion or pneumothorax. The heart size and mediastinal contours are stable. No acute osseous findings are identified. Telemetry leads overlie the chest. IMPRESSION: Low lung volumes with patchy bibasilar opacities favoring atelectasis. No definite evidence of pneumonia. Electronically Signed   By: Elmon Hagedorn M.D.   On: 02/25/2024 09:20    Procedures Procedures    Medications Ordered in ED Medications  vancomycin  (VANCOREADY) IVPB 1750 mg/350 mL (1,750 mg Intravenous New Bag/Given 02/25/24 1105)  LORazepam (ATIVAN) injection 1 mg (1 mg Intravenous Given 02/25/24 0940)  ceFEPIme  (MAXIPIME ) 2 g in sodium chloride  0.9 % 100 mL IVPB (0 g Intravenous Stopped 02/25/24 1125)    ED Course/ Medical Decision Making/ A&P Clinical Course as of 02/25/24 1140  Tue Feb 25, 2024  1029 Lactic Acid, Venous(!!): 3.1 [JS]    Clinical Course User Index [JS] Mosetta Ferdinand, PA-C                                 Medical Decision Making Amount and/or  Complexity of Data Reviewed Labs: ordered. Decision-making details documented in ED Course. Radiology: ordered.  Risk Prescription drug management.    This patient presents to the ED for concern of urinary retention, this involves a number of treatment options, and is a complaint that carries with it a high risk of complications and morbidity.  The differential diagnosis includes infection, dislodged catheter.    Co morbidities: Discussed in HPI   Brief History:  See HPI.   EMR reviewed including pt PMHx, past surgical history and past visits to ER.   See HPI for more details   Lab Tests:  I ordered and independently interpreted labs.  The pertinent results include:    Labs notable for CBC with a slight leukocytosis of 11.8, hemoglobin is within normal limits.  CMP with decrease in sodium, glucose is elevated at 338, creatinine function is unremarkable.  LFTs are within normal limits.  PT and INR are normal.  He is not anticoagulated.  Imaging Studies:  NAD. I personally reviewed all imaging studies and no acute abnormality found. I agree with radiology interpretation.  Cardiac Monitoring:  The patient was maintained on a cardiac monitor.  I personally viewed and interpreted the cardiac monitored which showed an underlying rhythm of: Sinus tachycardia EKG non-ischemic  Medicines ordered:  I ordered medication including Ativan for symptomatic treatment Reevaluation of the patient after these medicines showed that the patient improved I have reviewed the patients home medicines and have made adjustments as needed   Critical Interventions:  Made a code sepsis based on elevated heart rate, afebrile and normotensive.  Antibiotic such as cefepime  and vancomycin  have been ordered.  Reevaluation:  After the interventions noted above I re-evaluated patient and found that they have :stayed the same   Social Determinants of Health:  The patient's social determinants of  health were a factor in the care of this patient  Problem List / ED Course:  Patient coming in from home with urinary retention, with indwelling catheter that was placed.  Recent discharge from SNF due to time was up , complaining of pain along his buttocks, back, groin area.When we turned patient over, he was covered in residual urine, residual stool, foul odor from linen that had not been changed in several days.  He also is wearing the same gown that he was likely wearing in the hospital, this is all stain and very dirty.  He states I am trying my best , however his appearance appeared of someone who has not been taking care of very well. Here his labs are remarkable for leukocytosis, elevated blood sugars, notably he decreased medication or not.  UA has been sent off.  Given Ativan to help with spasms, symptomatic treatment.  I do suspect that patient's wounds are worsening since his discharge home from the hospital.  He does not have around-the-clock care at home, he is currently not taking care of himself, I do feel that he warrants admission at this time for further treatment of his cellulitis, and urinary retention.  Dispostion:  After consideration of the diagnostic results and the patients response to treatment, I feel that the patent would benefit from admission for further management of his ongoing cellulitis.    Portions of this note were generated with Scientist, clinical (histocompatibility and immunogenetics). Dictation errors may occur despite best attempts at proofreading.   Final Clinical Impression(s) / ED Diagnoses Final diagnoses:  Cellulitis of buttock    Rx / DC Orders ED Discharge Orders     None         Luellen Sages, PA-C 02/25/24 1140    Mordecai Applebaum, MD 02/26/24 [phone]

## 2024-02-25 NOTE — ED Triage Notes (Signed)
 Pt from home D/C from SNF "not to long ago", pt c/o butt pain and a catheter that is not draining right. Also states that he never lets the cath is always in the bed with him.  CBG 336

## 2024-02-26 DIAGNOSIS — R339 Retention of urine, unspecified: Secondary | ICD-10-CM

## 2024-02-26 LAB — CBC
HCT: 34.4 % — ABNORMAL LOW (ref 39.0–52.0)
Hemoglobin: 11.4 g/dL — ABNORMAL LOW (ref 13.0–17.0)
MCH: 30.4 pg (ref 26.0–34.0)
MCHC: 33.1 g/dL (ref 30.0–36.0)
MCV: 91.7 fL (ref 80.0–100.0)
Platelets: 241 10*3/uL (ref 150–400)
RBC: 3.75 MIL/uL — ABNORMAL LOW (ref 4.22–5.81)
RDW: 13.5 % (ref 11.5–15.5)
WBC: 5.7 10*3/uL (ref 4.0–10.5)
nRBC: 0 % (ref 0.0–0.2)

## 2024-02-26 LAB — MRSA NEXT GEN BY PCR, NASAL: MRSA by PCR Next Gen: DETECTED — AB

## 2024-02-26 LAB — MAGNESIUM: Magnesium: 1.8 mg/dL (ref 1.7–2.4)

## 2024-02-26 LAB — BASIC METABOLIC PANEL WITH GFR
Anion gap: 4 — ABNORMAL LOW (ref 5–15)
BUN: 21 mg/dL (ref 8–23)
CO2: 26 mmol/L (ref 22–32)
Calcium: 8.9 mg/dL (ref 8.9–10.3)
Chloride: 104 mmol/L (ref 98–111)
Creatinine, Ser: 0.73 mg/dL (ref 0.61–1.24)
GFR, Estimated: >60 mL/min (ref >60)
Glucose, Bld: 337 mg/dL — ABNORMAL HIGH (ref 70–99)
Potassium: 4 mmol/L (ref 3.5–5.1)
Sodium: 134 mmol/L — ABNORMAL LOW (ref 135–145)

## 2024-02-26 LAB — VITAMIN D 25 HYDROXY (VIT D DEFICIENCY, FRACTURES): Vit D, 25-Hydroxy: 21.61 ng/mL — ABNORMAL LOW (ref 30–100)

## 2024-02-26 LAB — GLUCOSE, CAPILLARY
Glucose-Capillary: 382 mg/dL — ABNORMAL HIGH (ref 70–99)
Glucose-Capillary: 287 mg/dL — ABNORMAL HIGH (ref 70–99)
Glucose-Capillary: 296 mg/dL — ABNORMAL HIGH (ref 70–99)

## 2024-02-26 LAB — PHOSPHORUS: Phosphorus: 3.5 mg/dL (ref 2.5–4.6)

## 2024-02-26 MED ORDER — VITAMIN C 500 MG PO TABS
500.0000 mg | ORAL_TABLET | Freq: Every day | ORAL | Status: DC
  Administered 2024-02-26 – 2024-03-01 (×5): 500 mg via ORAL
  Filled 2024-02-26 (×5): qty 1

## 2024-02-26 MED ORDER — TAMSULOSIN HCL 0.4 MG PO CAPS
0.4000 mg | ORAL_CAPSULE | Freq: Every day | ORAL | Status: DC
  Administered 2024-02-26 – 2024-02-29 (×4): 0.4 mg via ORAL
  Filled 2024-02-26 (×4): qty 1

## 2024-02-26 MED ORDER — FINASTERIDE 5 MG PO TABS
5.0000 mg | ORAL_TABLET | Freq: Every day | ORAL | Status: DC
  Administered 2024-02-26 – 2024-03-01 (×5): 5 mg via ORAL
  Filled 2024-02-26 (×5): qty 1

## 2024-02-26 MED ORDER — VITAMIN B-12 1000 MCG PO TABS: 1000.0000 ug | ORAL_TABLET | Freq: Every day | ORAL | Status: DC

## 2024-02-26 MED ORDER — CYANOCOBALAMIN 1000 MCG/ML IJ SOLN
1000.0000 ug | Freq: Every day | INTRAMUSCULAR | Status: DC
  Administered 2024-02-26 – 2024-03-01 (×5): 1000 ug via INTRAMUSCULAR
  Filled 2024-02-26 (×5): qty 1

## 2024-02-26 MED ORDER — HYDROCERIN EX CREA
TOPICAL_CREAM | Freq: Two times a day (BID) | CUTANEOUS | Status: DC
  Filled 2024-02-26: qty 113

## 2024-02-26 MED ORDER — VITAMIN D (ERGOCALCIFEROL) 1.25 MG (50000 UNIT) PO CAPS
50000.0000 [IU] | ORAL_CAPSULE | ORAL | Status: DC
  Administered 2024-02-26: 50000 [IU] via ORAL
  Filled 2024-02-26: qty 1

## 2024-02-26 MED ORDER — BISACODYL 10 MG RE SUPP: 10.0000 mg | Freq: Every day | RECTAL | Status: DC | PRN

## 2024-02-26 MED ORDER — POLYSACCHARIDE IRON COMPLEX 150 MG PO CAPS
150.0000 mg | ORAL_CAPSULE | Freq: Every day | ORAL | Status: DC
  Administered 2024-02-26 – 2024-03-01 (×5): 150 mg via ORAL
  Filled 2024-02-26 (×5): qty 1

## 2024-02-26 MED ORDER — BISACODYL 5 MG PO TBEC
10.0000 mg | DELAYED_RELEASE_TABLET | Freq: Every day | ORAL | Status: DC
  Administered 2024-02-26 – 2024-02-29 (×4): 10 mg via ORAL
  Filled 2024-02-26 (×4): qty 2

## 2024-02-26 MED ORDER — CHLORHEXIDINE GLUCONATE CLOTH 2 % EX PADS
6.0000 | MEDICATED_PAD | Freq: Every day | CUTANEOUS | Status: DC
  Administered 2024-02-26 – 2024-03-01 (×5): 6 via TOPICAL

## 2024-02-26 MED ORDER — BISACODYL 5 MG PO TBEC
10.0000 mg | DELAYED_RELEASE_TABLET | Freq: Once | ORAL | Status: AC
  Administered 2024-02-26: 10 mg via ORAL
  Filled 2024-02-26: qty 2

## 2024-02-26 MED ORDER — SODIUM CHLORIDE 0.9 % IV SOLN
1.0000 g | INTRAVENOUS | Status: DC
  Administered 2024-02-26 – 2024-02-28 (×3): 1 g via INTRAVENOUS
  Filled 2024-02-26 (×3): qty 10

## 2024-02-26 MED ORDER — POLYETHYLENE GLYCOL 3350 17 G PO PACK
17.0000 g | PACK | Freq: Two times a day (BID) | ORAL | Status: DC
  Administered 2024-02-26 – 2024-03-01 (×9): 17 g via ORAL
  Filled 2024-02-26 (×9): qty 1

## 2024-02-26 MED ORDER — OXYBUTYNIN CHLORIDE 5 MG PO TABS
5.0000 mg | ORAL_TABLET | Freq: Three times a day (TID) | ORAL | Status: DC | PRN
  Administered 2024-02-26 – 2024-02-27 (×2): 5 mg via ORAL
  Filled 2024-02-26 (×3): qty 1

## 2024-02-26 NOTE — Progress Notes (Addendum)
 [facility] Progress Note  Patient: Micheal Moore.    ZOX:[id number]  DOA: 02/25/2024     Date of Service: the patient was seen and examined on 02/26/2024  Chief Complaint  Patient presents with   Urinary Retention   Groin Pain   Brief hospital course: 73 year old with a history of CAD status post PCI April 2024, DM2, HLD, BPH with chronic Foley catheter, and PVD status post left AKA who has suffered multiple recent hospitalizations.  He was most recently admitted to the hospital 4/29-01/22/2024 for treatment of a severe rash on his back that was ultimately felt to be due to contact dermatitis versus tinea corporis.  Following that admission he was discharged to a SNF for a rehab stay.  He was recently discharged home from his SNF because he ran out of time per his report.  History suggest that he laid in bed without much care at home.  EMS was summoned to transport the patient to the ER today because of severe persisting buttock and groin pain along with leaking of urine around his Foley catheter and a sensation of urinary retention.  In the emergency room he was found to have severe moisture associated skin damage to his groin and buttocks bilaterally with extensive but superficial skin breakdown.  He was also noted to be covered in stool and fetid urine.  It appeared that he had not moved from bed and had been left in his own excrement for some time.   Assessment and Plan:  # Severe sepsis secondary to UTI Presented with tachycardia, tachypnea, leukocytosis and elevated lactic acid level S/p IV fluid given in the ED, lactic acid 1.5 now Vital signs stable  # UTI could be secondary to chronic Foley catheter insertion Foley catheter was exchanged in the ED on 6/10 UA positive, urine culture growing GNR S/p cefepime  and vancomycin  given in the ED Started ceftriaxone  1 g IV daily Follow urine culture sensitivity report   # BPH with chronic Foley catheter and acute Foley  dysfunction/urinary retention Foley catheter was exchanged in the ED on 6/10 as per RN Started Flomax  and Proscar C/o pain during voiding, started Ditropan prn for possible bladder spasms    # Severe moisture associated skin damage groin and buttocks WOC to be consulted for suggested wound care  S/p one dose of Diflucan  given, continue topical nystatin  and keep skin clean and dry.    # Uncontrolled DM2 Continue Semglee  20 units nightly Sliding scale monitor CBG Continue diabetic diet  # CAD with history of PCI 2024 Asymptomatic at present Continue with aspirin  and statin   # Prior history of orthostatic hypotension Previously required midodrine  and Florinef  -no longer being dosed as patient is now essentially bedbound   # Vitamin B12 level 264, at lower end, goal >400.  Started vitamin B12 1000 mcg IM injection during hospital stay followed by oral supplement.  Follow with PCP to repeat B12 level after 3 to 6 months.  # Vitamin D  insufficiency: started vitamin D  50,000 units p.o. weekly, follow with PCP to repeat vitamin D  level after 3 to 6 months.  # Severe failure to thrive It would appear the patient's home environment is not safe for him -we will need to investigate other options for placement when he is medically stable for discharge but  # Iron deficiency, mild, iron level 31 slightly low, transferrin saturation 19% WNL Started oral iron supplement with vitamin C   # Constipation, may be contributing to pain with  voiding Last BM was 4 to 5 days ago Started laxatives  Body mass index is 20.75 kg/m.  Interventions:  Diet: Medium carb modified diet DVT Prophylaxis: Subcutaneous Lovenox    Advance goals of care discussion: Full code  Family Communication: family was not present at bedside, at the time of interview.  The pt provided permission to discuss medical plan with the family. Opportunity was given to ask question and all questions were answered satisfactorily.    Disposition:  Pt is from home, admitted with sepsis due to UTI, still on IV antibiotics, which precludes a safe discharge. Discharge to home versus SNF, TBD after PT and OT eval, when stable, may need 1-2 more days to improve.  Subjective: No significant events overnight, patient saw complaining of pain in the bladder area with voiding, he has Foley catheter.  Complaining of constipation for 4 to 5 days.  Agreed for laxatives.  No any other active issues  Physical Exam: General: NAD, lying comfortably Appear in no distress, affect appropriate Eyes: PERRLA ENT: Oral Mucosa Clear, moist  Neck: no JVD,  Cardiovascular: S1 and S2 Present, no Murmur,  Respiratory: good respiratory effort, Bilateral Air entry equal and Decreased, no Crackles, no wheezes Abdomen: Bowel Sound present, Soft and no tenderness,  Skin: no rashes Extremities: s/p Left AKA, RLE dry skin, chronic venous stasis pigmentation  Neurologic: without any new focal findings Gait not checked due to patient safety concerns  Vitals:   02/25/24 1941 02/26/24 0006 02/26/24 0347 02/26/24 0738  BP: 115/63 114/67 130/69 131/76  Pulse: (!) 109 98 91 91  Resp: 16 16 16 18   Temp: 99.1 F (37.3 C) 98.8 F (37.1 C) 97.9 F (36.6 C) 98.5 F (36.9 C)  TempSrc: Oral Oral Oral Oral  SpO2: 97% 97% 97% 99%  Weight:      Height:        Intake/Output Summary (Last 24 hours) at 02/26/2024 1438 Last data filed at 02/26/2024 0515 Gross per 24 hour  Intake 1675.06 ml  Output 1400 ml  Net 275.06 ml   Filed Weights   02/25/24 0844  Weight: 79.4 kg    Data Reviewed: I have personally reviewed and interpreted daily labs, tele strips, imagings as discussed above. I reviewed all nursing notes, pharmacy notes, vitals, pertinent old records I have discussed plan of care as described above with RN and patient/family.  CBC: Recent Labs  Lab 02/25/24 0859 02/26/24 0444  WBC 11.8* 5.7  NEUTROABS 9.8*  --   HGB 13.8 11.4*  HCT  41.7 34.4*  MCV 92.3 91.7  PLT 311 241   Basic Metabolic Panel: Recent Labs  Lab 02/25/24 0859 02/26/24 0444 02/26/24 1000  NA 133* 134*  --   K 4.1 4.0  --   CL 98 104  --   CO2 23 26  --   GLUCOSE 338* 337*  --   BUN 22 21  --   CREATININE 1.04 0.73  --   CALCIUM  9.6 8.9  --   MG  --   --  1.8  PHOS  --   --  3.5    Studies: No results found.  Scheduled Meds:  ascorbic acid   500 mg Oral Daily   aspirin  EC  81 mg Oral Daily   [START ON 02/27/2024] atorvastatin   10 mg Oral Once per day on Monday Thursday   Chlorhexidine  Gluconate Cloth  6 each Topical Daily   cyanocobalamin  1,000 mcg Intramuscular Daily   Followed by   [  START ON 03/04/2024] vitamin B-12  1,000 mcg Oral Daily   enoxaparin  (LOVENOX ) injection  40 mg Subcutaneous Q24H   finasteride  5 mg Oral Daily   Gerhardt's butt cream   Topical TID   insulin  aspart  0-15 Units Subcutaneous TID WC   insulin  aspart  0-5 Units Subcutaneous QHS   insulin  glargine-yfgn  20 Units Subcutaneous QHS   iron polysaccharides  150 mg Oral Daily   nystatin    Topical TID   Ensure Max Protein  11 oz Oral BID   tamsulosin   0.4 mg Oral QPC supper   Continuous Infusions:  sodium chloride  75 mL/hr at 02/26/24 0515   PRN Meds: acetaminophen  **OR** acetaminophen , hydrOXYzine , ondansetron , oxybutynin, oxyCODONE , polyethylene glycol  Time spent: 55 minutes  Author: Althia Atlas. MD [facility] 02/26/2024 2:38 PM  To reach On-call, see care teams to locate the attending and reach out to them via [email or web address]. If 7PM-7AM, please contact night-coverage If you still have difficulty reaching the attending provider, please page the [facility] (Director on Call) for [facility] on amion for assistance.

## 2024-02-26 NOTE — Inpatient Diabetes Management (Signed)
 Inpatient Diabetes Program Recommendations  AACE/ADA: New Consensus Statement on Inpatient Glycemic Control (2015)  Target Ranges:  Prepandial:   less than 140 mg/dL      Peak postprandial:   less than 180 mg/dL (1-2 hours)      Critically ill patients:  140 - 180 mg/dL   Lab Results  Component Value Date   GLUCAP 382 (H) 02/26/2024   HGBA1C 8.2 (H) 01/15/2024    Review of Glycemic Control  Diabetes history: DM 2 Outpatient Diabetes medications: Novolog  8 units tid, Levemir  20 units qhs Current orders for Inpatient glycemic control:  Semglee  20 units qhs increased this am (may want more given this am) Novolog  0-15 units tid + hs  Inpatient Diabetes Program Recommendations:    -   also add Novolog  5 units tid meal coverage if eating >50% of meals  Thanks,  Eloise Hake RN, MSN, BC-ADM Inpatient Diabetes Coordinator Team Pager [phone] (8a-5p)

## 2024-02-26 NOTE — Plan of Care (Signed)

## 2024-02-26 NOTE — Plan of Care (Signed)
 Pt alert and oriented x4. Resting in bed. No complaints voiced. VS WNL at present. IVF's infusing w/o difficulty. Tolerating po intake. Foley intact and patent with yellow urine. Encouraged to reposition often. Assistance offered with turning. Blood glucose monitored. Encouraged to call for assistance as needed. Call light in reach. Srx2 elevated. Bed on low position. Bed alarm activated.

## 2024-02-27 DIAGNOSIS — R339 Retention of urine, unspecified: Secondary | ICD-10-CM | POA: Diagnosis not present

## 2024-02-27 LAB — CBC
HCT: 33.3 % — ABNORMAL LOW (ref 39.0–52.0)
Hemoglobin: 10.9 g/dL — ABNORMAL LOW (ref 13.0–17.0)
MCH: 30.5 pg (ref 26.0–34.0)
MCHC: 32.7 g/dL (ref 30.0–36.0)
MCV: 93.3 fL (ref 80.0–100.0)
Platelets: 234 10*3/uL (ref 150–400)
RBC: 3.57 MIL/uL — ABNORMAL LOW (ref 4.22–5.81)
RDW: 13.6 % (ref 11.5–15.5)
WBC: 5.5 10*3/uL (ref 4.0–10.5)
nRBC: 0 % (ref 0.0–0.2)

## 2024-02-27 LAB — BASIC METABOLIC PANEL WITH GFR
Anion gap: 6 (ref 5–15)
BUN: 21 mg/dL (ref 8–23)
CO2: 26 mmol/L (ref 22–32)
Calcium: 8.8 mg/dL — ABNORMAL LOW (ref 8.9–10.3)
Chloride: 101 mmol/L (ref 98–111)
Creatinine, Ser: 0.62 mg/dL (ref 0.61–1.24)
GFR, Estimated: >60 mL/min (ref >60)
Glucose, Bld: 274 mg/dL — ABNORMAL HIGH (ref 70–99)
Potassium: 4.2 mmol/L (ref 3.5–5.1)
Sodium: 133 mmol/L — ABNORMAL LOW (ref 135–145)

## 2024-02-27 LAB — GLUCOSE, CAPILLARY
Glucose-Capillary: 207 mg/dL — ABNORMAL HIGH (ref 70–99)
Glucose-Capillary: 257 mg/dL — ABNORMAL HIGH (ref 70–99)
Glucose-Capillary: 303 mg/dL — ABNORMAL HIGH (ref 70–99)
Glucose-Capillary: 276 mg/dL — ABNORMAL HIGH (ref 70–99)
Glucose-Capillary: 344 mg/dL — ABNORMAL HIGH (ref 70–99)

## 2024-02-27 LAB — URINE CULTURE

## 2024-02-27 LAB — PHOSPHORUS: Phosphorus: 3.9 mg/dL (ref 2.5–4.6)

## 2024-02-27 LAB — MAGNESIUM: Magnesium: 1.9 mg/dL (ref 1.7–2.4)

## 2024-02-27 MED ORDER — BISACODYL 10 MG RE SUPP
10.0000 mg | Freq: Once | RECTAL | Status: AC
  Administered 2024-02-27: 10 mg via RECTAL
  Filled 2024-02-27: qty 1

## 2024-02-27 MED ORDER — MUPIROCIN 2 % EX OINT
1.0000 | TOPICAL_OINTMENT | Freq: Two times a day (BID) | CUTANEOUS | Status: DC
  Administered 2024-02-27 – 2024-03-01 (×7): 1 via NASAL
  Filled 2024-02-27: qty 22

## 2024-02-27 MED ORDER — SMOG ENEMA
960.0000 mL | Freq: Once | RECTAL | Status: DC
  Filled 2024-02-27: qty 960

## 2024-02-27 NOTE — Evaluation (Signed)
 Occupational Therapy Evaluation Patient Details Name: Micheal Moore. MRN: [id number] DOB: Sep 19, 1950 Today's Date: 02/27/2024   History of Present Illness   Pt is a 73 yr old male who presented from home 02/25/24 due to dysuria with urinary retention. He was found to have severe sepsis secondary to UTI with severe skin damage at the groin and buttocks. Pt has recently had multiple hospital stays and reported they were dc from SNF as they ran out of time and noted report about they have not been OOB for some time. 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 He reported waiting for family to assist and holds in Bms until they come home. Pt in session attempt to educate on positioning, use of bed pan for bms with hygiene and risks of further skin breakdown. At this time attempted to complete transfers to chair but RLE was sliding and then started to have blood discharge from bandage on RLE. Patient will benefit from continued inpatient follow up therapy, <3 hours/day.     If plan is discharge home, recommend the following:   A lot of help with walking and/or transfers;A lot of help with bathing/dressing/bathroom;Assistance with cooking/housework;Direct supervision/assist for financial management;Assist for transportation;Help with stairs or ramp for entrance     Functional Status Assessment   Patient has had a recent decline in their functional status and demonstrates the ability to make significant improvements in function in a reasonable and predictable amount of time.     Equipment Recommendations   Other (comment) (TBD)     Recommendations for Other Services          Precautions/Restrictions   Precautions Precautions: Fall Recall of Precautions/Restrictions: Impaired Restrictions Weight Bearing Restrictions Per Provider Order: No Other Position/Activity Restrictions: however, wound on RLE may limit WB     Mobility Bed Mobility Overal bed mobility: Needs Assistance Bed Mobility: Supine to Sit     Supine to sit: Mod assist     General bed mobility comments: Pt needed increase time while siting at EOB and needed about 2 mins with mod assist to get midline position as decrease also in position.    Transfers                   General transfer comment: attempted transfer but RLE was slipping and then started to bleed (nursing aware). Attempt slidding board transfer next      Balance Overall balance assessment: Needs assistance Sitting-balance support: Feet supported, Bilateral upper extremity supported Sitting balance-[name]: Fair Sitting balance - Comments: mod assist to CGA                                   ADL either performed or assessed with clinical judgement   ADL Overall ADL's : Needs assistance/impaired Eating/Feeding: Set up;Sitting   Grooming: Set up;Sitting   Upper Body Bathing: Contact guard assist;Sitting   Lower Body Bathing: Moderate assistance;Sitting/lateral leans   Upper Body Dressing : Contact guard assist;Sitting   Lower Body Dressing: Moderate assistance;Sitting/lateral leans;Maximal assistance       Toileting- Clothing Manipulation and Hygiene: Moderate assistance;Sitting/lateral lean;Maximal assistance  Vision         Perception         Praxis         Pertinent Vitals/Pain Pain Assessment Pain Assessment: Faces Faces Pain Scale: Hurts little more Pain Location: neck, skin Pain Descriptors / Indicators: Aching Pain Intervention(s): Limited activity within patient's tolerance, Monitored during session, Repositioned     Extremity/Trunk  Assessment Upper Extremity Assessment Upper Extremity Assessment: Overall WFL for tasks assessed   Lower Extremity Assessment Lower Extremity Assessment: Defer to PT evaluation   Cervical / Trunk Assessment Cervical / Trunk Assessment: Kyphotic   Communication Communication Communication: No apparent difficulties   Cognition Arousal: Alert Behavior During Therapy: Restless               OT - Cognition Comments: need to further assess as noted in conversation about saftey at home and problem solving                 Following commands: Impaired       Cueing  General Comments          Exercises     Shoulder Instructions      Home Living Family/patient expects to be discharged to:: Private residence Living Arrangements: Spouse/significant other Available Help at Discharge: Family;Available PRN/intermittently Type of Home: House Home Access: Stairs to enter [employer] of Steps: 2 to the poarch and one from porch into home Entrance Stairs-Rails: None Home Layout: One level     Bathroom Shower/Tub: Producer, television/film/video: Standard     Home Equipment: Agricultural consultant (2 wheels);Wheelchair - manual;Hospital bed   Additional Comments: He reported that his family works night shifts so their is limited assist with OOB and ADLS but feels safe in his home.      Prior Functioning/Environment Prior Level of Function : Needs assist       Physical Assist : Mobility (physical)     Mobility Comments: Pt reported they were going to get a new prothesis pick up this week. He reported never was able to walk on RLE and must have family to transfer OOB to stabilize [facility] for transfers. He also reported not getting OOB every day but not a clear answer on how often. ADLs Comments: Per chart, assists at bed level, wife completes most of his care. Pt reported in this session on how they wait for family to come home for BMs.    OT Problem List:  Decreased strength;Decreased activity tolerance;Impaired balance (sitting and/or standing);Decreased safety awareness;Decreased knowledge of use of DME or AE;Pain   OT Treatment/Interventions: Self-care/ADL training;Therapeutic exercise;DME and/or AE instruction;Therapeutic activities;Patient/family education;Balance training      OT Goals(Current goals can be found in the care plan section)   Acute Rehab OT Goals Patient Stated Goal: to go home OT Goal Formulation: With patient Time For Goal Achievement: 03/12/24 Potential to Achieve Goals: Fair   OT Frequency:  Min 2X/week    Co-evaluation              AM-PAC OT 6 Clicks Daily Activity     Outcome Measure Help from another person eating meals?: None Help from another person taking care of personal grooming?: A Little Help from another person toileting, which includes using toliet, bedpan, or urinal?: A Lot Help from another person bathing (including washing, rinsing, drying)?: A Lot Help from another person to put on and taking off regular upper body clothing?: A Little Help from another person to put on and taking  off regular lower body clothing?: A Lot 6 Click Score: 16   End of Session Equipment Utilized During Treatment: Gait belt Nurse Communication: Mobility status  Activity Tolerance: Patient limited by pain Patient left: in bed;with call bell/phone within reach;with bed alarm set;with nursing/sitter in room  OT Visit Diagnosis: Unsteadiness on feet (R26.81);Other abnormalities of gait and mobility (R26.89);Repeated falls (R29.6);Muscle weakness (generalized) (M62.81);Pain Pain - part of body:  (back, neck)                Time: 0932-1000 OT Time Calculation (min): 28 min Charges:  OT General Charges $OT Visit: 1 Visit OT Evaluation $OT Eval Low Complexity: 1 Low  Erving Heather OTR/L  Acute Rehab Services  [phone] office number   Stevphen Elders 02/27/2024, 11:32 AM

## 2024-02-27 NOTE — Evaluation (Signed)
 Physical Therapy Evaluation Patient Details Name: Micheal Moore. MRN: [id number] DOB: [birth year] Today's Date: 02/27/2024  History of Present Illness  Pt is a 73 yr old male who presented from home 02/25/24 due to dysuria with urinary retention. He was found to have severe sepsis secondary to UTI with severe skin damage at the groin and buttocks. Pt has recently had multiple hospital stays and reported they were dc from SNF as they ran out of time and noted report about they have not been OOB for some time. 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 Pt from home with family but has minimal support because of their work schedules. He reports that when his son is available that he can get in a w/c with use of slide board but otherwise stays in bed. Pt does not seem to fully understand the correlation between hygiene and infection risk. Tried to reiterate this to him in multiple ways. Pt with decreased strength and ROM RLE and has been unable to stand since L AKA. Attempted transfer today but pt did not have drop arm recliner available and was unable to lift up on RLE to pivot to chair. Drop arm recliner obtained and put in room for next visit. Pt needed mod A for all mobility on eval. Patient will benefit from continued inpatient follow up therapy, <3 hours/day.   Pt currently with functional limitations due to the deficits listed below (see PT Problem List). Pt will benefit from acute skilled PT to increase their independence and safety with mobility to allow discharge.           If plan is discharge home, recommend the following: A lot of help with walking and/or transfers;A little help with bathing/dressing/bathroom;Assistance with cooking/housework;Assist for transportation;Help with stairs or ramp for entrance   Can travel  by private vehicle   No    Equipment Recommendations None recommended by PT  Recommendations for Other Services       Functional Status Assessment Patient has had a recent decline in their functional status and demonstrates the ability to make significant improvements in function in a reasonable and predictable amount of time.     Precautions / Restrictions Precautions Precautions: Fall Recall of Precautions/Restrictions: Impaired Restrictions Weight Bearing Restrictions Per Provider Order: No Other Position/Activity Restrictions: however, wound on RLE may limit WB      Mobility  Bed Mobility Overal bed mobility: Needs Assistance Bed Mobility: Supine to Sit, Sit to Supine     Supine to sit: Mod assist Sit to supine: Mod assist   General bed mobility comments: pt needed mod HHA and use of rail to come to sitting and took >2 mins in sitting to gain balance EOB, needing mod A in the process. Mod A to return safely to supine. Pt is able to use arms to pull self up in bed with headboard.    Transfers                   General transfer comment: attempted transfer but RLE was slipping and then started to bleed (nursing aware). Drop arm recliner brought to room for next session and pt will need to use sliding board    Ambulation/Gait               General Gait Details: has been unable since  AKA  Stairs            Wheelchair Mobility     Tilt Bed    Modified Rankin (Stroke Patients Only)       Balance Overall balance assessment: Needs assistance Sitting-balance support: Feet supported, Bilateral upper extremity supported Sitting balance-[name]: Fair Sitting balance - Comments: mod assist to CGA                                     Pertinent Vitals/Pain Pain Assessment Pain Assessment: Faces Faces Pain Scale: Hurts little more Pain Location: neck, skin Pain Descriptors / Indicators: Aching Pain Intervention(s): Limited  activity within patient's tolerance, Monitored during session    Home Living Family/patient expects to be discharged to:: Private residence Living Arrangements: Spouse/significant other Available Help at Discharge: Family;Available PRN/intermittently Type of Home: House Home Access: Stairs to enter Entrance Stairs-Rails: None Entrance Stairs-Number of Steps: 2 to the porch and one from porch into home (pt reports family pulls him up in w/c)   Home Layout: One level Home Equipment: Agricultural consultant (2 wheels);Wheelchair - manual;Hospital bed (sliding board) Additional Comments: son and daughter in law work night shifts and sleep in the day and pt cannot get OOB without help from son so assist if very limited    Prior Function Prior Level of Function : Needs assist       Physical Assist : Mobility (physical)     Mobility Comments: Pt reported they were going to get a new prothesis pick up this week. He reported never was able to walk on RLE and must have family to transfer OOB to stabilize [facility] for transfers. He also reported not getting OOB every day but not a clear answer on how often. ADLs Comments: Per chart, assists at bed level, wife completes most of his care. Pt reported in this session on how they wait for family to come home for BMs.     Extremity/Trunk Assessment   Upper Extremity Assessment Upper Extremity Assessment: Defer to OT evaluation    Lower Extremity Assessment Lower Extremity Assessment: RLE deficits/detail;LLE deficits/detail RLE Deficits / Details: has bleeding wounds R great toe, tight, shiny skin on foot and lower leg, minimal movement R ankle and tightness R knee RLE Sensation: history of peripheral neuropathy;decreased proprioception;decreased light touch RLE Coordination: decreased gross motor LLE Deficits / Details: AKA    Cervical / Trunk Assessment Cervical / Trunk Assessment: Kyphotic  Communication   Communication Communication: No apparent  difficulties    Cognition Arousal: Alert Behavior During Therapy: Restless   PT - Cognitive impairments: Problem solving, Awareness                       PT - Cognition Comments: pt with low health literacy and does not seem to understand the importance of staying clean is regards to preventing infection, this was reiterated to him several times during session Following commands: Impaired Following commands impaired: Follows one step commands inconsistently     Cueing Cueing Techniques: Verbal cues, Tactile cues     General Comments      Exercises General Exercises - Lower Extremity Ankle Circles/Pumps: AROM, Right, 10 reps, Supine Long Arc Quad: AROM, Right, 10 reps, Seated   Assessment/Plan    PT Assessment Patient needs continued PT services  PT Problem List Decreased strength;Decreased range of motion;Decreased activity tolerance;Decreased balance;Decreased mobility;Decreased cognition;Decreased safety awareness;Decreased knowledge  of precautions;Decreased skin integrity;Pain;Impaired sensation       PT Treatment Interventions DME instruction;Functional mobility training;Therapeutic activities;Therapeutic exercise;Balance training;Neuromuscular re-education;Cognitive remediation;Patient/family education;Wheelchair mobility training    PT Goals (Current goals can be found in the Care Plan section)  Acute Rehab PT Goals Patient Stated Goal: return home PT Goal Formulation: With patient Time For Goal Achievement: 03/12/24 Potential to Achieve Goals: Fair    Frequency Min 2X/week     Co-evaluation PT/OT/SLP Co-Evaluation/Treatment: Yes Reason for Co-Treatment: For patient/therapist safety;Necessary to address cognition/behavior during functional activity;Complexity of the patient's impairments (multi-system involvement) PT goals addressed during session: Mobility/safety with mobility;Balance         AM-PAC PT 6 Clicks Mobility  Outcome Measure Help  needed turning from your back to your side while in a flat bed without using bedrails?: A Lot Help needed moving from lying on your back to sitting on the side of a flat bed without using bedrails?: A Lot Help needed moving to and from a bed to a chair (including a wheelchair)?: A Lot Help needed standing up from a chair using your arms (e.g., wheelchair or bedside chair)?: Total Help needed to walk in hospital room?: Total Help needed climbing 3-5 steps with a railing? : Total 6 Click Score: 9    End of Session   Activity Tolerance: Patient tolerated treatment well Patient left: in bed;with call bell/phone within reach;with bed alarm set Nurse Communication: Mobility status PT Visit Diagnosis: Muscle weakness (generalized) (M62.81);Difficulty in walking, not elsewhere classified (R26.2);Pain Pain - Right/Left: Right Pain - part of body: Ankle and joints of foot    Time: 0932-0959 PT Time Calculation (min) (ACUTE ONLY): 27 min   Charges:   PT Evaluation $PT Eval Moderate Complexity: 1 Mod   PT General Charges $$ ACUTE PT VISIT: 1 Visit         Amey Ka, PT  Acute Rehab Services Secure chat preferred Office [phone]   Deloris Fetters Amal Renbarger 02/27/2024, 12:24 PM

## 2024-02-27 NOTE — Progress Notes (Signed)
 [facility] Progress Note  Patient: Micheal Moore.    YQM:[id number]  DOA: 02/25/2024     Date of Service: the patient was seen and examined on 02/27/2024  Chief Complaint  Patient presents with   Urinary Retention   Groin Pain   Brief hospital course: 73 year old with a history of CAD status post PCI April 2024, DM2, HLD, BPH with chronic Foley catheter, and PVD status post left AKA who has suffered multiple recent hospitalizations.  He was most recently admitted to the hospital 4/29-01/22/2024 for treatment of a severe rash on his back that was ultimately felt to be due to contact dermatitis versus tinea corporis.  Following that admission he was discharged to a SNF for a rehab stay.  He was recently discharged home from his SNF because he ran out of time per his report.  History suggest that he laid in bed without much care at home.  EMS was summoned to transport the patient to the ER today because of severe persisting buttock and groin pain along with leaking of urine around his Foley catheter and a sensation of urinary retention.  In the emergency room he was found to have severe moisture associated skin damage to his groin and buttocks bilaterally with extensive but superficial skin breakdown.  He was also noted to be covered in stool and fetid urine.  It appeared that he had not moved from bed and had been left in his own excrement for some time.   Assessment and Plan:  # Severe sepsis secondary to UTI Presented with tachycardia, tachypnea, leukocytosis and elevated lactic acid level S/p IV fluid given in the ED, lactic acid 1.5 now Vital signs stable  # UTI could be secondary to chronic Foley catheter insertion Foley catheter was exchanged in the ED on 6/10 UA positive, urine culture growing procidentia Rettgeri, sensitive to ceftriaxone  and Cipro  S/p cefepime  and vancomycin  given in the ED Continue ceftriaxone  1 g IV daily, will transition to oral antibiotics on  discharge for total 7 days   # BPH with chronic Foley catheter and acute Foley dysfunction/urinary retention Foley catheter was exchanged in the ED on 6/10 as per RN Started Flomax  and Proscar C/o pain during voiding, started Ditropan prn for possible bladder spasms 6/12 pain resolved, feels better now   # Severe moisture associated skin damage groin and buttocks WOC to be consulted for suggested wound care  S/p one dose of Diflucan  given, continue topical nystatin  and keep skin clean and dry.    # Uncontrolled DM2 Continue Semglee  20 units nightly Sliding scale monitor CBG Continue diabetic diet  # CAD with history of PCI 2024 Asymptomatic at present Continue with aspirin  and statin   # Prior history of orthostatic hypotension Previously required midodrine  and Florinef  -no longer being dosed as patient is now essentially bedbound   # Vitamin B12 level 264, at lower end, goal >400.  Started vitamin B12 1000 mcg IM injection during hospital stay followed by oral supplement.  Follow with PCP to repeat B12 level after 3 to 6 months.  # Vitamin D  insufficiency: started vitamin D  50,000 units p.o. weekly, follow with PCP to repeat vitamin D  level after 3 to 6 months.  # Severe failure to thrive It would appear the patient's home environment is not safe for him -we will need to investigate other options for placement when he is medically stable for discharge but  # Iron deficiency, mild, iron level 31 slightly low, transferrin saturation 19%  WNL Started oral iron supplement with vitamin C   # Constipation, may be contributing to pain with voiding Last BM was 4 to 5 days ago Started laxatives, patient had a BM on 6/11 as per RN  Body mass index is 20.75 kg/m.  Interventions:  Diet: Medium carb modified diet DVT Prophylaxis: Subcutaneous Lovenox    Advance goals of care discussion: Full code  Family Communication: family was not present at bedside, at the time of interview.   The pt provided permission to discuss medical plan with the family. Opportunity was given to ask question and all questions were answered satisfactorily.   Disposition:  Pt is from home, admitted with sepsis due to UTI, still on IV antibiotics, which precludes a safe discharge. Discharge to SNF as per PT/OT eval. Clinically stable, medically optimized and ready for discharge.  Bed will be available.  Follow TOC for disposition plan and placement   Subjective: No significant events overnight, patient stated that he has not had a BM yet, feels improvement in the abdominal pain.  No any other complaints. As per RN patient had a BM yesterday, it seems patient has underlying dementia and forgetfulness.   Physical Exam: General: NAD, lying comfortably Appear in no distress, affect appropriate Eyes: PERRLA ENT: Oral Mucosa Clear, moist  Neck: no JVD,  Cardiovascular: S1 and S2 Present, no Murmur,  Respiratory: good respiratory effort, Bilateral Air entry equal and Decreased, no Crackles, no wheezes Abdomen: Bowel Sound present, Soft and no tenderness,  Skin: no rashes Extremities: s/p Left AKA, RLE dry skin, chronic venous stasis pigmentation  Neurologic: without any new focal findings Gait not checked due to patient safety concerns  Vitals:   02/26/24 0738 02/26/24 1915 02/27/24 0407 02/27/24 0757  BP: 131/76 121/64 126/73 (!) 145/83  Pulse: 91 90 76 84  Resp: 18 18 16    Temp: 98.5 F (36.9 C) 98.7 F (37.1 C) 97.8 F (36.6 C) 98 F (36.7 C)  TempSrc: Oral Oral Oral   SpO2: 99% 96% 96% 100%  Weight:      Height:        Intake/Output Summary (Last 24 hours) at 02/27/2024 1438 Last data filed at 02/27/2024 1000 Gross per 24 hour  Intake 897.9 ml  Output 3075 ml  Net -2177.1 ml   Filed Weights   02/25/24 0844  Weight: 79.4 kg    Data Reviewed: I have personally reviewed and interpreted daily labs, tele strips, imagings as discussed above. I reviewed all nursing notes,  pharmacy notes, vitals, pertinent old records I have discussed plan of care as described above with RN and patient/family.  CBC: Recent Labs  Lab 02/25/24 0859 02/26/24 0444 02/27/24 0450  WBC 11.8* 5.7 5.5  NEUTROABS 9.8*  --   --   HGB 13.8 11.4* 10.9*  HCT 41.7 34.4* 33.3*  MCV 92.3 91.7 93.3  PLT 311 241 234   Basic Metabolic Panel: Recent Labs  Lab 02/25/24 0859 02/26/24 0444 02/26/24 1000 02/27/24 0450  NA 133* 134*  --  133*  K 4.1 4.0  --  4.2  CL 98 104  --  101  CO2 23 26  --  26  GLUCOSE 338* 337*  --  274*  BUN 22 21  --  21  CREATININE 1.04 0.73  --  0.62  CALCIUM  9.6 8.9  --  8.8*  MG  --   --  1.8 1.9  PHOS  --   --  3.5 3.9    Studies: No results  found.  Scheduled Meds:  ascorbic acid   500 mg Oral Daily   aspirin  EC  81 mg Oral Daily   atorvastatin   10 mg Oral Once per day on Monday Thursday   bisacodyl   10 mg Oral QHS   Chlorhexidine  Gluconate Cloth  6 each Topical Daily   cyanocobalamin  1,000 mcg Intramuscular Daily   Followed by   Cecily Cohen ON 03/04/2024] vitamin B-12  1,000 mcg Oral Daily   enoxaparin  (LOVENOX ) injection  40 mg Subcutaneous Q24H   finasteride  5 mg Oral Daily   Gerhardt's butt cream   Topical TID   hydrocerin   Topical BID   insulin  aspart  0-15 Units Subcutaneous TID WC   insulin  aspart  0-5 Units Subcutaneous QHS   insulin  glargine-yfgn  20 Units Subcutaneous QHS   iron polysaccharides  150 mg Oral Daily   mupirocin  ointment  1 Application Nasal BID   nystatin    Topical TID   polyethylene glycol  17 g Oral BID   Ensure Max Protein  11 oz Oral BID   SMOG  960 mL Rectal Once   tamsulosin   0.4 mg Oral QPC supper   Vitamin D  (Ergocalciferol )  50,000 Units Oral Q7 days   Continuous Infusions:  sodium chloride  75 mL/hr at 02/27/24 1258   cefTRIAXone  (ROCEPHIN )  IV Stopped (02/26/24 1532)   PRN Meds: acetaminophen  **OR** acetaminophen , bisacodyl , hydrOXYzine , ondansetron , oxybutynin, oxyCODONE   Time spent: 40  minutes  Author: Althia Atlas. MD [facility] 02/27/2024 2:38 PM  To reach On-call, see care teams to locate the attending and reach out to them via [email or web address]. If 7PM-7AM, please contact night-coverage If you still have difficulty reaching the attending provider, please page the [facility] (Director on Call) for [facility] on amion for assistance.

## 2024-02-27 NOTE — TOC Initial Note (Signed)
 Transition of Care (TOC) - Initial/Assessment Note    Patient Details  Name: Micheal Moore. MRN: [id number] Date of Birth: 02-07-1951  Transition of Care [facility] CM/SW Contact:    Juliane Och, LCSW Phone Number: 02/27/2024, 3:09 PM  Clinical Narrative:                  3:09 PM CSW introduced self and role to patient. CSW informed patient of physical therapy recommendation of patient discharging to SNF. Patient was agreeable to recommendation. CSW informed patient that they are in SNF copay days and provided estimated copay ($200 a day). CSW informed patient that copay would be billed and not required upfront. Patient expressed interest in discharging home vs. SNF even if copay was not required upfront. CSW informed patient that Medicaid would cover copays. Patient stated that he has Medicaid and that spouse has insurance card. Patient stated that they are to follow up on SNF with spouse when they visit later today. CSW consulted financial counseling regarding Medicaid status.  Expected Discharge Plan: Skilled Nursing Facility Barriers to Discharge: Continued Medical Work up   Patient Goals and CMS Choice Patient states their goals for this hospitalization and ongoing recovery are:: SNF          Expected Discharge Plan and Services In-house Referral: Clinical Social Work   Post Acute Care Choice: Skilled Nursing Facility Living arrangements for the past 2 months: Single Family Home                                      Prior Living Arrangements/Services Living arrangements for the past 2 months: Single Family Home Lives with:: Spouse Patient language and need for interpreter reviewed:: Yes Do you feel safe going back to the place where you live?: Yes            Criminal Activity/Legal Involvement Pertinent to Current Situation/Hospitalization: No - Comment as needed  Activities of Daily Living   ADL Screening (condition at time of  admission) Independently performs ADLs?: No Does the patient have a NEW difficulty with bathing/dressing/toileting/self-feeding that is expected to last >3 days?: No Does the patient have a NEW difficulty with getting in/out of bed, walking, or climbing stairs that is expected to last >3 days?: No Does the patient have a NEW difficulty with communication that is expected to last >3 days?: No Is the patient deaf or have difficulty hearing?: No Does the patient have difficulty seeing, even when wearing glasses/contacts?: No Does the patient have difficulty concentrating, remembering, or making decisions?: No  Permission Sought/Granted Permission sought to share information with : Family Supports, Oceanographer granted to share information with : No (Contact information on chart)  Share Information with NAME: Don Tiu  Permission granted to share info w AGENCY: SNF  Permission granted to share info w Relationship: Spouse  Permission granted to share info w Contact Information: [phone]  Emotional Assessment Appearance:: Appears stated age Attitude/Demeanor/Rapport: Engaged Affect (typically observed): Adaptable, Accepting, Calm, Pleasant, Appropriate, Stable Orientation: : Oriented to Self, Oriented to Place, Oriented to  Time, Oriented to Situation Alcohol  / Substance Use: Not Applicable Psych Involvement: No (comment)  Admission diagnosis:  Cellulitis of buttock [L03.317] Urinary retention [R33.9] Patient Active Problem List   Diagnosis Date Noted   Urinary retention 02/25/2024   Cellulitis 01/14/2024   Chronic indwelling Foley catheter 01/14/2024   Sepsis secondary to UTI (  HCC) 12/26/2023   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   MRSA infection 06/24/2023   History of MDR Pseudomonas aeruginosa infection 06/24/2023   Ambulatory dysfunction 05/01/2023   Weakness 04/24/2023   Medication management 04/16/2023   Bacteremia 04/12/2023    Aspiration pneumonia (HCC) 04/11/2023   Pressure injury of skin 04/11/2023   Nausea and vomiting 04/11/2023   CAD (coronary artery disease) 04/11/2023   PVD (peripheral vascular disease) (HCC) 04/11/2023   Debility 04/11/2023   Hyperglycemia 01/16/2023   STEMI (ST elevation myocardial infarction) (HCC) 01/15/2023   Anterior dislocation of left hip (HCC) 08/24/2022   Closed dislocation of left hip (HCC) 08/23/2022   Normocytic anemia 08/23/2022   Protein-calorie malnutrition, moderate (HCC) 07/20/2022   Orthostatic hypotension    Hyperlipidemia 02/22/2022   Chronic diastolic CHF (congestive heart failure) (HCC) 02/22/2022   Hx of left BKA (HCC) 05/06/2020   Type 2 diabetes mellitus with hyperlipidemia (HCC) 04/27/2020   History of DVT (deep vein thrombosis) 04/27/2020   Tobacco abuse 04/27/2020   PCP:  Vicente Graham, No Pharmacy:   [id number] - [place] [address] [place] [address] Phone: [phone] Fax: [phone]  [facility] [address] [place] [address] Phone: [phone] Fax: [phone]     Social Drivers of Health (SDOH) Social History: SDOH Screenings   Food Insecurity: No Food Insecurity (02/25/2024)  Housing: Low Risk  (02/25/2024)  Transportation Needs: No Transportation Needs (02/25/2024)  Utilities: Not At Risk (02/25/2024)  Social Connections: Socially Integrated (02/25/2024)  Recent Concern: Social Connections - Socially Isolated (01/15/2024)  Tobacco Use: High Risk (02/25/2024)   SDOH Interventions:     Readmission Risk Interventions    06/24/2023    3:31 PM 04/25/2023    1:04 PM 04/12/2023    4:47 PM  Readmission Risk Prevention Plan  Transportation Screening Complete Complete Complete  PCP or Specialist Appt within 3-5 Days  Complete Complete  HRI or Home Care Consult  Complete Complete  Social Work Consult for Recovery Care Planning/Counseling   Complete Complete  Palliative Care Screening  Complete Not Applicable  Medication Review Oceanographer) Complete Complete Referral to Pharmacy  PCP or Specialist appointment within 3-5 days of discharge Complete    HRI or Home Care Consult Complete    SW Recovery Care/Counseling Consult Complete    Palliative Care Screening Complete    Skilled Nursing Facility Complete

## 2024-02-27 NOTE — Inpatient Diabetes Management (Addendum)
 Inpatient Diabetes Program Recommendations  AACE/ADA: New Consensus Statement on Inpatient Glycemic Control (2015)  Target Ranges:  Prepandial:   less than 140 mg/dL      Peak postprandial:   less than 180 mg/dL (1-2 hours)      Critically ill patients:  140 - 180 mg/dL   Lab Results  Component Value Date   GLUCAP 257 (H) 02/27/2024   HGBA1C 8.2 (H) 01/15/2024    Review of Glycemic Control  Latest Reference Range & Units 02/26/24 07:37 02/26/24 12:06 02/26/24 16:35 02/26/24 22:26 02/27/24 07:57  Glucose-Capillary 70 - 99 mg/dL 161 (H) 096 (H) 045 (H) 296 (H) 257 (H)   Diabetes history: DM 2 Outpatient Diabetes medications: Novolog  8 units tid, Levemir  20 units qhs Current orders for Inpatient glycemic control:  Semglee  20 units qhs  Novolog  0-15 units tid + hs  Inpatient Diabetes Program Recommendations:    -   Increase Semglee  to 25 units -   also add Novolog  4 units tid meal coverage if eating >50% of meals  Thanks,  Eloise Hake RN, MSN, BC-ADM Inpatient Diabetes Coordinator Team Pager [phone] (8a-5p)

## 2024-02-28 DIAGNOSIS — R339 Retention of urine, unspecified: Secondary | ICD-10-CM | POA: Diagnosis not present

## 2024-02-28 LAB — BASIC METABOLIC PANEL WITH GFR
Anion gap: 9 (ref 5–15)
BUN: 19 mg/dL (ref 8–23)
CO2: 24 mmol/L (ref 22–32)
Calcium: 9.2 mg/dL (ref 8.9–10.3)
Chloride: 101 mmol/L (ref 98–111)
Creatinine, Ser: 0.71 mg/dL (ref 0.61–1.24)
GFR, Estimated: >60 mL/min (ref >60)
Glucose, Bld: 330 mg/dL — ABNORMAL HIGH (ref 70–99)
Potassium: 4.6 mmol/L (ref 3.5–5.1)
Sodium: 134 mmol/L — ABNORMAL LOW (ref 135–145)

## 2024-02-28 LAB — CBC
HCT: 34.3 % — ABNORMAL LOW (ref 39.0–52.0)
Hemoglobin: 11.2 g/dL — ABNORMAL LOW (ref 13.0–17.0)
MCH: 30.6 pg (ref 26.0–34.0)
MCHC: 32.7 g/dL (ref 30.0–36.0)
MCV: 93.7 fL (ref 80.0–100.0)
Platelets: 249 10*3/uL (ref 150–400)
RBC: 3.66 MIL/uL — ABNORMAL LOW (ref 4.22–5.81)
RDW: 13.5 % (ref 11.5–15.5)
WBC: 4.8 10*3/uL (ref 4.0–10.5)
nRBC: 0 % (ref 0.0–0.2)

## 2024-02-28 LAB — GLUCOSE, CAPILLARY
Glucose-Capillary: 320 mg/dL — ABNORMAL HIGH (ref 70–99)
Glucose-Capillary: 297 mg/dL — ABNORMAL HIGH (ref 70–99)
Glucose-Capillary: 292 mg/dL — ABNORMAL HIGH (ref 70–99)
Glucose-Capillary: 304 mg/dL — ABNORMAL HIGH (ref 70–99)

## 2024-02-28 LAB — PHOSPHORUS: Phosphorus: 3.8 mg/dL (ref 2.5–4.6)

## 2024-02-28 LAB — MAGNESIUM: Magnesium: 2 mg/dL (ref 1.7–2.4)

## 2024-02-28 MED ORDER — INSULIN GLARGINE-YFGN 100 UNIT/ML ~~LOC~~ SOLN
20.0000 [IU] | Freq: Two times a day (BID) | SUBCUTANEOUS | Status: DC
  Administered 2024-02-28 – 2024-03-01 (×5): 20 [IU] via SUBCUTANEOUS
  Filled 2024-02-28 (×7): qty 0.2

## 2024-02-28 NOTE — Progress Notes (Signed)
 [facility] Progress Note  Patient: Micheal Moore.    EAV:[id number]  DOA: 02/25/2024     Date of Service: the patient was seen and examined on 02/28/2024  Chief Complaint  Patient presents with   Urinary Retention   Groin Pain   Brief hospital course: 73 year old with a history of CAD status post PCI April 2024, DM2, HLD, BPH with chronic Foley catheter, and PVD status post left AKA who has suffered multiple recent hospitalizations.  He was most recently admitted to the hospital 4/29-01/22/2024 for treatment of a severe rash on his back that was ultimately felt to be due to contact dermatitis versus tinea corporis.  Following that admission he was discharged to a SNF for a rehab stay.  He was recently discharged home from his SNF because he ran out of time per his report.  History suggest that he laid in bed without much care at home.  EMS was summoned to transport the patient to the ER today because of severe persisting buttock and groin pain along with leaking of urine around his Foley catheter and a sensation of urinary retention.  In the emergency room he was found to have severe moisture associated skin damage to his groin and buttocks bilaterally with extensive but superficial skin breakdown.  He was also noted to be covered in stool and fetid urine.  It appeared that he had not moved from bed and had been left in his own excrement for some time.   Assessment and Plan:  # Severe sepsis secondary to UTI Presented with tachycardia, tachypnea, leukocytosis and elevated lactic acid level S/p IV fluid given in the ED, lactic acid 1.5 now Vital signs stable  # UTI could be secondary to chronic Foley catheter insertion Foley catheter was exchanged in the ED on 6/10 UA positive, urine culture growing procidentia Rettgeri, sensitive to ceftriaxone  and Cipro  S/p cefepime  and vancomycin  given in the ED Continue ceftriaxone  1 g IV daily, will transition to oral antibiotics on  discharge for total 7 days   # BPH with chronic Foley catheter and acute Foley dysfunction/urinary retention Foley catheter was exchanged in the ED on 6/10 as per RN Started Flomax  and Proscar  C/o pain during voiding, started Ditropan  prn for possible bladder spasms 6/13 pain resolved, feels better now   # Severe moisture associated skin damage groin and buttocks WOC to be consulted for suggested wound care  S/p one dose of Diflucan  given, continue topical nystatin  and keep skin clean and dry.    # Uncontrolled DM2 6/13 Increased Semglee  20 units BID Sliding scale monitor CBG Continue diabetic diet  # CAD with history of PCI 2024 Asymptomatic at present Continue with aspirin  and statin   # Prior history of orthostatic hypotension Previously required midodrine  and Florinef  -no longer being dosed as patient is now essentially bedbound   # Vitamin B12 level 264, at lower end, goal >400.  Started vitamin B12 1000 mcg IM injection during hospital stay followed by oral supplement.  Follow with PCP to repeat B12 level after 3 to 6 months.  # Vitamin D  insufficiency: started vitamin D  50,000 units p.o. weekly, follow with PCP to repeat vitamin D  level after 3 to 6 months.  # Severe failure to thrive It would appear the patient's home environment is not safe for him -we will need to investigate other options for placement when he is medically stable for discharge but  # Iron  deficiency, mild, iron  level 31 slightly low, transferrin saturation  19% WNL Started oral iron  supplement with vitamin C   # Constipation, may be contributing to pain with voiding Last BM was 4 to 5 days ago Started laxatives, constipation resolved    Body mass index is 20.75 kg/m.  Interventions:  Diet: Medium carb modified diet DVT Prophylaxis: Subcutaneous Lovenox    Advance goals of care discussion: Full code  Family Communication: family was not present at bedside, at the time of interview.  The pt  provided permission to discuss medical plan with the family. Opportunity was given to ask question and all questions were answered satisfactorily.   Disposition:  Pt is from home, admitted with sepsis due to UTI, still on IV antibiotics, which precludes a safe discharge. Discharge to SNF as per PT/OT eval. Clinically stable, medically optimized and ready for discharge when  Bed will be available.  Follow TOC for disposition plan and placement   Subjective: No significant events overnight, patient has significant dementia and forgetfulness.  Overall he is doing fine, denies any dysuria, no abdominal pain. Patient is not aware of his situation and stated that he would like to go home. TOC is working on placement and they are in touch with his wife.   Physical Exam: General: NAD, lying comfortably Appear in no distress, affect appropriate Eyes: PERRLA ENT: Oral Mucosa Clear, moist  Neck: no JVD,  Cardiovascular: S1 and S2 Present, no Murmur,  Respiratory: good respiratory effort, Bilateral Air entry equal and Decreased, no Crackles, no wheezes Abdomen: Bowel Sound present, Soft and no tenderness,  Skin: no rashes Extremities: s/p Left AKA, RLE dry skin, chronic venous stasis pigmentation  Neurologic: without any new focal findings Gait not checked due to patient safety concerns  Vitals:   02/27/24 1603 02/27/24 1942 02/28/24 0412 02/28/24 0741  BP: (!) 144/91 138/85 139/81 137/89  Pulse: 96 93 90 87  Resp:  16 16 17   Temp: 98 F (36.7 C) 98.2 F (36.8 C) 98.2 F (36.8 C) 97.8 F (36.6 C)  TempSrc:  Oral Oral Oral  SpO2: 100% 100% 98% 97%  Weight:      Height:        Intake/Output Summary (Last 24 hours) at 02/28/2024 1313 Last data filed at 02/28/2024 1000 Gross per 24 hour  Intake 2223.75 ml  Output 4600 ml  Net -2376.25 ml   Filed Weights   02/25/24 0844  Weight: 79.4 kg    Data Reviewed: I have personally reviewed and interpreted daily labs, tele strips, imagings  as discussed above. I reviewed all nursing notes, pharmacy notes, vitals, pertinent old records I have discussed plan of care as described above with RN and patient/family.  CBC: Recent Labs  Lab 02/25/24 0859 02/26/24 0444 02/27/24 0450 02/28/24 0555  WBC 11.8* 5.7 5.5 4.8  NEUTROABS 9.8*  --   --   --   HGB 13.8 11.4* 10.9* 11.2*  HCT 41.7 34.4* 33.3* 34.3*  MCV 92.3 91.7 93.3 93.7  PLT 311 241 234 249   Basic Metabolic Panel: Recent Labs  Lab 02/25/24 0859 02/26/24 0444 02/26/24 1000 02/27/24 0450 02/28/24 0555  NA 133* 134*  --  133* 134*  K 4.1 4.0  --  4.2 4.6  CL 98 104  --  101 101  CO2 23 26  --  26 24  GLUCOSE 338* 337*  --  274* 330*  BUN 22 21  --  21 19  CREATININE 1.04 0.73  --  0.62 0.71  CALCIUM  9.6 8.9  --  8.8*  9.2  MG  --   --  1.8 1.9 2.0  PHOS  --   --  3.5 3.9 3.8    Studies: No results found.  Scheduled Meds:  ascorbic acid   500 mg Oral Daily   aspirin  EC  81 mg Oral Daily   atorvastatin   10 mg Oral Once per day on Monday Thursday   bisacodyl   10 mg Oral QHS   Chlorhexidine  Gluconate Cloth  6 each Topical Daily   cyanocobalamin   1,000 mcg Intramuscular Daily   Followed by   Cecily Cohen ON 03/04/2024] vitamin B-12  1,000 mcg Oral Daily   enoxaparin  (LOVENOX ) injection  40 mg Subcutaneous Q24H   finasteride   5 mg Oral Daily   Gerhardt's butt cream   Topical TID   hydrocerin   Topical BID   insulin  aspart  0-15 Units Subcutaneous TID WC   insulin  aspart  0-5 Units Subcutaneous QHS   insulin  glargine-yfgn  20 Units Subcutaneous BID   iron  polysaccharides  150 mg Oral Daily   mupirocin  ointment  1 Application Nasal BID   nystatin    Topical TID   polyethylene glycol  17 g Oral BID   Ensure Max Protein  11 oz Oral BID   SMOG  960 mL Rectal Once   tamsulosin   0.4 mg Oral QPC supper   Vitamin D  (Ergocalciferol )  50,000 Units Oral Q7 days   Continuous Infusions:  sodium chloride  75 mL/hr at 02/28/24 0618   cefTRIAXone  (ROCEPHIN )  IV Stopped  (02/27/24 1737)   PRN Meds: acetaminophen  **OR** acetaminophen , bisacodyl , hydrOXYzine , ondansetron , oxybutynin , oxyCODONE   Time spent: 40 minutes  Author: Althia Atlas. MD [facility] 02/28/2024 1:13 PM  To reach On-call, see care teams to locate the attending and reach out to them via [email or web address]. If 7PM-7AM, please contact night-coverage If you still have difficulty reaching the attending provider, please page the [facility] (Director on Call) for [facility] on amion for assistance.

## 2024-02-28 NOTE — Progress Notes (Signed)
 Physical Therapy Treatment Patient Details Name: Micheal Moore. MRN: [id number] DOB: [birth year] Today's Date: 02/28/2024   History of Present Illness Pt is a 73 yr old male who presented from home 02/25/24 due to dysuria with urinary retention. He was found to have severe sepsis secondary to UTI with severe skin damage at the groin and buttocks. Pt has recently had multiple hospital stays and reported they were dc from SNF as they ran out of time and noted report about they have not been OOB for some time. 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, UTI/sepsis related to UTI    PT Comments  Continuing work on functional mobility and activity tolerance;  Session focused on functional transfers, and pt showed some progress today using a sliding board for lateral scoot transfers;   He needed light mod assist and use of bedrails to come to sitting EOB; sitting balance and tolerance were made difficult by pt's posterior lean sitting up, likely due to R hamstring tightness (among other factors as well); He was dependent on at least one UE support in unsupported sitting; Noteworthy that he was able to lateral weight shift for sliding board placement (by this PT; at home he would require a caregiver to place board) relatively well;   He performed 2 lateral scoot transfers: bed>recliner>back to bed; during transfer, pt needed 2 person assist for safety and stability, and to guard on both sides of pt; needed RLE knee/lower leg block to get some stability for shifting forward and getting some weight onto RLE as a pivot point, and he tended to move his hand placement frequently and impulsively; for both transfers anterior weight shifting was difficult, and he had posterior bias throughout - though not so much that it made the transfer completely unsafe, but he did need +2 assist for safety and sliding power;   His is a  difficult situation at this point; He needs heavy assist for OOB transfers (to [facility] for toileting and/or to wheelchair for mobility), and it sounds like family is unable to provide at or near 24 hour assist; My concern is that if he goes home at current level, he will fall back into poor hygiene practices and the physical deconditioning that come with bedrest; I wonder if pt can get involved in a program similar to [facility], where he can get to a day center, where his supervision, hygiene, and mobility needs can be met ([facility] has onsite PT and OT as well, and they provide [place] transportation too)  It sounds like his SNF benefits have run out; Post-acute rehab is what he needs to maximize independence and safety with mobility and ADLs and be able to get home in a much better functional state; He is participating, and made progress today; will continue to follow   If plan is discharge home, recommend the following: A lot of help with walking and/or transfers;A little help with bathing/dressing/bathroom;Assistance with cooking/housework;Assist for transportation;Help with stairs or ramp for entrance   Can travel by private vehicle     No  Equipment Recommendations  Other (comment) (consider a long sliding board)    Recommendations for Other Services       Precautions / Restrictions Precautions Precautions: Fall Recall of Precautions/Restrictions: Impaired Restrictions Weight Bearing Restrictions Per Provider Order: No Other Position/Activity Restrictions: however, wound on RLE may limit WB     Mobility  Bed Mobility Overal bed  mobility: Needs Assistance Bed Mobility: Supine to Sit, Sit to Supine     Supine to sit: Mod assist Sit to supine: Mod assist   General bed mobility comments: pt needed mod HHA and use of rail to come to sitting and took >2 mins in sitting to gain balance EOB, needing mod A in the process. Mod A to return safely to supine. Pt is able to use arms to pull self up in bed  with headboard. reports im getting tired but difficulty following commands for hand placement to allow ease with supported sitting eob.    Transfers Overall transfer level: Needs assistance Equipment used: Sliding board Transfers: Bed to chair/wheelchair/BSC            Lateral/Scoot Transfers: +2 physical assistance, Max assist General transfer comment: performed lateral scooting with sliding board bed to recliner (with seat height boosted with floded blankets); 2 person assist for support on both sides and posteriorly; noting difficutly with anterior lean onto R foot on floor for a pivot point; tends to reach out for hand placement/hand holds without planning where to put his hands -- seems impulsive    Ambulation/Gait               General Gait Details: has been unable since AKA   Stairs             Wheelchair Mobility     Tilt Bed    Modified Rankin (Stroke Patients Only)       Balance Overall balance assessment: Needs assistance Sitting-balance support: Feet supported, Bilateral upper extremity supported Sitting balance-[name]: Fair Sitting balance - Comments: mod assist to CGA                                    Communication Communication Communication: No apparent difficulties  Cognition Arousal: Alert Behavior During Therapy: WFL for tasks assessed/performed   PT - Cognitive impairments: Problem solving, Awareness                       PT - Cognition Comments: pt with low health literacy and does not seem to understand the importance of staying clean is regards to preventing infection, this was reiterated to him several times during session Following commands: Impaired Following commands impaired: Follows one step commands inconsistently    Cueing    Exercises      General Comments General comments (skin integrity, edema, etc.): He likes to lead sessions      Pertinent Vitals/Pain Pain Assessment Pain  Assessment: No/denies pain Faces Pain Scale: Hurts a little bit Pain Location: skin, all over when bed pads are not organized Pain Descriptors / Indicators: Aching Pain Intervention(s): Limited activity within patient's tolerance    Home Living                          Prior Function            PT Goals (current goals can now be found in the care plan section) Acute Rehab PT Goals Patient Stated Goal: return home PT Goal Formulation: With patient Time For Goal Achievement: 03/12/24 Potential to Achieve Goals: Fair Progress towards PT goals: Progressing toward goals    Frequency    Min 2X/week      PT Plan      Co-evaluation  AM-PAC PT 6 Clicks Mobility   Outcome Measure  Help needed turning from your back to your side while in a flat bed without using bedrails?: A Little Help needed moving from lying on your back to sitting on the side of a flat bed without using bedrails?: A Little Help needed moving to and from a bed to a chair (including a wheelchair)?: A Lot Help needed standing up from a chair using your arms (e.g., wheelchair or bedside chair)?: Total Help needed to walk in hospital room?: Total Help needed climbing 3-5 steps with a railing? : Total 6 Click Score: 11    End of Session Equipment Utilized During Treatment: Gait belt;Other (comment) (sliding board) Activity Tolerance: Patient tolerated treatment well Patient left: in bed;with call bell/phone within reach;with bed alarm set Nurse Communication: Mobility status PT Visit Diagnosis: Muscle weakness (generalized) (M62.81);Difficulty in walking, not elsewhere classified (R26.2);Pain Pain - Right/Left: Right Pain - part of body: Ankle and joints of foot     Time: 6387-5643 PT Time Calculation (min) (ACUTE ONLY): 29 min  Charges:    $Therapeutic Activity: 23-37 mins PT General Charges $$ ACUTE PT VISIT: 1 Visit                     Darcus Eastern, PT  Acute  Rehabilitation Services Office [phone] Secure Chat welcomed    Marcial Setting 02/28/2024, 12:36 PM

## 2024-02-28 NOTE — Progress Notes (Signed)
 Occupational Therapy Treatment Patient Details Name: Micheal Moore. MRN: [id number] DOB: 12-Feb-1951 Today's Date: 02/28/2024   History of present illness Pt is a 73 yr old male who presented from home 02/25/24 due to dysuria with urinary retention. He was found to have severe sepsis secondary to UTI with severe skin damage at the groin and buttocks. Pt has recently had multiple hospital stays and reported they were dc from SNF as they ran out of time and noted report about they have not been OOB for some time. 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 Seen for skilled OT treatment session.  Pt. Able transition from supine/sit with MOD A and max cues for hand placement and sequencing.  Pt. Unable to maintain un supported sitting eob and required assistance back to bed. Pt. Able to utilize bed controls and headboard to pull self up in bed.  Provided red theraband for BUE HEP.  Pt. Able to return demo for UE exercises.  Cont. With acute OT POC and progress bed mobility and adls next session.        If plan is discharge home, recommend the following:  A lot of help with walking and/or transfers;A lot of help with bathing/dressing/bathroom;Assistance with cooking/housework;Direct supervision/assist for financial management;Assist for transportation;Help with stairs or ramp for entrance   Equipment Recommendations       Recommendations for Other Services      Precautions / Restrictions Precautions Precautions: Fall Recall of Precautions/Restrictions: Impaired Restrictions Other Position/Activity Restrictions: however, wound on RLE may limit WB       Mobility Bed Mobility Overal bed mobility: Needs Assistance Bed Mobility: Supine to Sit, Sit to Supine     Supine to sit: Mod assist     General bed mobility comments: pt needed mod HHA and use of  rail to come to sitting and took >2 mins in sitting to gain balance EOB, needing mod A in the process. Mod A to return safely to supine. Pt is able to use arms to pull self up in bed with headboard. reports im getting tired but difficulty following commands for hand placement to allow ease with supported sitting eob.    Transfers                   General transfer comment: deferred secondary to RLE slipping     Balance                                           ADL either performed or assessed with clinical judgement   ADL                                              Extremity/Trunk Assessment              Vision       Perception     Praxis     Communication Communication Communication: No apparent difficulties   Cognition Arousal: Alert Behavior During Therapy: [facility] for tasks assessed/performed  Following commands impaired: Follows one step commands inconsistently      Cueing   Cueing Techniques: Verbal cues, Tactile cues  Exercises General Exercises - Upper Extremity Shoulder Flexion: Strengthening, 10 reps, Theraband, Both Theraband Level (Shoulder Flexion): Level 2 (Red) Shoulder Extension: Strengthening, 10 reps, Theraband, Both Theraband Level (Shoulder Extension): Level 2 (Red) Elbow Extension: Strengthening, Both, 10 reps, Theraband Theraband Level (Elbow Extension): Level 2 (Red) Wrist Flexion: Strengthening, Both, 10 reps, Theraband Theraband Level (Wrist Flexion): Level 2 (Red) Other Exercises Other Exercises: upper bed rails for BUE pulling for weight shifting and strengthening for rolling with bed mobility 3-5 reps each side Other Exercises: lower bed rails for BUE bed level seated crunches for core strengthening sets of 5 reps    Shoulder Instructions       General Comments      Pertinent Vitals/ Pain       Pain Assessment Pain Assessment:  Faces Faces Pain Scale: Hurts a little bit Pain Location: skin, all over when bed pads are not organized Pain Descriptors / Indicators: Aching Pain Intervention(s): Limited activity within patient's tolerance, Repositioned, Monitored during session  Home Living                                          Prior Functioning/Environment              Frequency  Min 2X/week        Progress Toward Goals  OT Goals(current goals can now be found in the care plan section)  Progress towards OT goals: Progressing toward goals     Plan      Co-evaluation                 AM-PAC OT 6 Clicks Daily Activity     Outcome Measure   Help from another person eating meals?: None Help from another person taking care of personal grooming?: A Little Help from another person toileting, which includes using toliet, bedpan, or urinal?: A Lot Help from another person bathing (including washing, rinsing, drying)?: A Lot Help from another person to put on and taking off regular upper body clothing?: A Little Help from another person to put on and taking off regular lower body clothing?: A Lot 6 Click Score: 16    End of Session    OT Visit Diagnosis: Unsteadiness on feet (R26.81);Other abnormalities of gait and mobility (R26.89);Repeated falls (R29.6);Muscle weakness (generalized) (M62.81);Pain   Activity Tolerance Patient tolerated treatment well   Patient Left in bed;with call bell/phone within reach;with bed alarm set   Nurse Communication Other (comment)        Time: 5621-3086 OT Time Calculation (min): 25 min  Charges: OT General Charges $OT Visit: 1 Visit OT Treatments $Therapeutic Exercise: 23-37 mins  Howell Macintosh, COTA/L Acute Rehabilitation [phone]   Leory Rands Lor[place]-COTA/L  02/28/2024, 10:57 AM

## 2024-02-28 NOTE — TOC Progression Note (Signed)
 Transition of Care (TOC) - Progression Note    Patient Details  Name: Micheal Moore. MRN: [id number] Date of Birth: 07/01/1951  Transition of Care [facility]) CM/SW Contact  Jeani Mill, RN Phone Number: 02/28/2024, 3:26 PM  Clinical Narrative:      Spoke to patient at bedside regarding discharging home. Patient declines home health due to his stepson working 3rd shift and sleeping during the day. Wife works during the day and gets home at [employer]. Patient said he has a wheelchair at home and apt for prothesis on June 18th.  Left VM with wife requesting return call.  Expected Discharge Plan: Skilled Nursing Facility Barriers to Discharge: Continued Medical Work up  Expected Discharge Plan and Services In-house Referral: Clinical Social Work   Post Acute Care Choice: Skilled Nursing Facility Living arrangements for the past 2 months: Single Family Home                                       Social Determinants of Health (SDOH) Interventions SDOH Screenings   Food Insecurity: No Food Insecurity (02/25/2024)  Housing: Low Risk  (02/25/2024)  Transportation Needs: No Transportation Needs (02/25/2024)  Utilities: Not At Risk (02/25/2024)  Social Connections: Socially Integrated (02/25/2024)  Recent Concern: Social Connections - Socially Isolated (01/15/2024)  Tobacco Use: High Risk (02/25/2024)    Readmission Risk Interventions    06/24/2023    3:31 PM 04/25/2023    1:04 PM 04/12/2023    4:47 PM  Readmission Risk Prevention Plan  Transportation Screening Complete Complete Complete  PCP or Specialist Appt within 3-5 Days  Complete Complete  HRI or Home Care Consult  Complete Complete  Social Work Consult for Recovery Care Planning/Counseling  Complete Complete  Palliative Care Screening  Complete Not Applicable  Medication Review Oceanographer) Complete Complete Referral to Pharmacy  PCP or Specialist appointment within 3-5 days of discharge Complete    HRI or  Home Care Consult Complete    SW Recovery Care/Counseling Consult Complete    Palliative Care Screening Complete    Skilled Nursing Facility Complete

## 2024-02-28 NOTE — TOC Progression Note (Addendum)
 Transition of Care (TOC) - Progression Note    Patient Details  Name: Micheal Moore. MRN: [id number] Date of Birth: 11-09-1950  Transition of Care [facility]) CM/SW Contact  Juliane Och, LCSW Phone Number: 02/28/2024, 10:07 AM  Clinical Narrative:     10:07 AM CSW spoke with patient's spouse, [name], regarding physical therapy recommendation of patient discharging to SNF. CSW informed [name] that there would be a copay for each day patient is at SNF ([facility] confirmed patient was there 05/07-05/31). [name] stated that they do not have the financial means to pay the copay at this time. CSW informed [name] that Medicaid could cover copay costs and requested patient's Medicaid information. [name] stated she would call CSW back later today after work and provide the information.  10:23 AM Per financial counseling, patient has MEDICAID FAMILY PLANNING MAFDN-FPW 02/16/2024 - 03/16/2024 and monthly income that exceeds qualifications for full Medicaid coverage. CSW relayed information to medical team and informed them that patient is unable to pay SNF copays at this time. CSW consulted financial counseling again in efforts to transition patient's Medicaid.  Expected Discharge Plan: Skilled Nursing Facility Barriers to Discharge: Continued Medical Work up  Expected Discharge Plan and Services In-house Referral: Clinical Social Work   Post Acute Care Choice: Skilled Nursing Facility Living arrangements for the past 2 months: Single Family Home                                       Social Determinants of Health (SDOH) Interventions SDOH Screenings   Food Insecurity: No Food Insecurity (02/25/2024)  Housing: Low Risk  (02/25/2024)  Transportation Needs: No Transportation Needs (02/25/2024)  Utilities: Not At Risk (02/25/2024)  Social Connections: Socially Integrated (02/25/2024)  Recent Concern: Social Connections - Socially Isolated (01/15/2024)  Tobacco Use: High Risk  (02/25/2024)    Readmission Risk Interventions    06/24/2023    3:31 PM 04/25/2023    1:04 PM 04/12/2023    4:47 PM  Readmission Risk Prevention Plan  Transportation Screening Complete Complete Complete  PCP or Specialist Appt within 3-5 Days  Complete Complete  HRI or Home Care Consult  Complete Complete  Social Work Consult for Recovery Care Planning/Counseling  Complete Complete  Palliative Care Screening  Complete Not Applicable  Medication Review Oceanographer) Complete Complete Referral to Pharmacy  PCP or Specialist appointment within 3-5 days of discharge Complete    HRI or Home Care Consult Complete    SW Recovery Care/Counseling Consult Complete    Palliative Care Screening Complete    Skilled Nursing Facility Complete

## 2024-02-28 NOTE — Inpatient Diabetes Management (Signed)
 Inpatient Diabetes Program Recommendations  AACE/ADA: New Consensus Statement on Inpatient Glycemic Control   Target Ranges:  Prepandial:   less than 140 mg/dL      Peak postprandial:   less than 180 mg/dL (1-2 hours)      Critically ill patients:  140 - 180 mg/dL    Latest Reference Range & Units 02/27/24 07:57 02/27/24 12:30 02/27/24 16:04 02/27/24 21:07 02/28/24 07:39  Glucose-Capillary 70 - 99 mg/dL 295 (H) 621 (H) 308 (H) 344 (H) 320 (H)   Review of Glycemic Control  Diabetes history: DM2 Outpatient Diabetes medications: Levemir  20 units at bedtime, Novolog  8 units TID with meals Current orders for Inpatient glycemic control: Semglee  20 units at bedtime, Novolog  0-15 units TID with meals, Novolog  0-5 units QHS  Inpatient Diabetes Program Recommendations:    Insulin : Please consider increasing Semglee  to 20 units BID (to start now). If post prandial glucose remains consistently over 180 mg/dl with increasing Semglee , may want to consider ordering Novolog  4 units TID with meals for meal coverage if patient eats at least 50% of meals.  Thanks, Beacher Limerick, RN, MSN, CDCES Diabetes Coordinator Inpatient Diabetes Program [phone] (Team Pager from 8am to 5pm)

## 2024-02-28 NOTE — Plan of Care (Signed)

## 2024-02-29 ENCOUNTER — Other Ambulatory Visit (HOSPITAL_COMMUNITY): Payer: Self-pay

## 2024-02-29 DIAGNOSIS — R339 Retention of urine, unspecified: Secondary | ICD-10-CM | POA: Diagnosis not present

## 2024-02-29 LAB — GLUCOSE, CAPILLARY
Glucose-Capillary: 249 mg/dL — ABNORMAL HIGH (ref 70–99)
Glucose-Capillary: 323 mg/dL — ABNORMAL HIGH (ref 70–99)
Glucose-Capillary: 248 mg/dL — ABNORMAL HIGH (ref 70–99)
Glucose-Capillary: 282 mg/dL — ABNORMAL HIGH (ref 70–99)

## 2024-02-29 LAB — CBC
HCT: 36.6 % — ABNORMAL LOW (ref 39.0–52.0)
Hemoglobin: 12 g/dL — ABNORMAL LOW (ref 13.0–17.0)
MCH: 30.3 pg (ref 26.0–34.0)
MCHC: 32.8 g/dL (ref 30.0–36.0)
MCV: 92.4 fL (ref 80.0–100.0)
Platelets: 258 10*3/uL (ref 150–400)
RBC: 3.96 MIL/uL — ABNORMAL LOW (ref 4.22–5.81)
RDW: 13.6 % (ref 11.5–15.5)
WBC: 4.9 10*3/uL (ref 4.0–10.5)
nRBC: 0 % (ref 0.0–0.2)

## 2024-02-29 LAB — BASIC METABOLIC PANEL WITH GFR
Anion gap: 12 (ref 5–15)
BUN: 19 mg/dL (ref 8–23)
CO2: 23 mmol/L (ref 22–32)
Calcium: 9.3 mg/dL (ref 8.9–10.3)
Chloride: 101 mmol/L (ref 98–111)
Creatinine, Ser: 0.63 mg/dL (ref 0.61–1.24)
GFR, Estimated: >60 mL/min (ref >60)
Glucose, Bld: 230 mg/dL — ABNORMAL HIGH (ref 70–99)
Potassium: 4.4 mmol/L (ref 3.5–5.1)
Sodium: 136 mmol/L (ref 135–145)

## 2024-02-29 LAB — MAGNESIUM: Magnesium: 2.1 mg/dL (ref 1.7–2.4)

## 2024-02-29 LAB — PHOSPHORUS: Phosphorus: 4.1 mg/dL (ref 2.5–4.6)

## 2024-02-29 MED ORDER — INSULIN GLARGINE 100 UNIT/ML SOLOSTAR PEN
20.0000 [IU] | PEN_INJECTOR | Freq: Two times a day (BID) | SUBCUTANEOUS | 0 refills | Status: DC
  Filled 2024-02-29: qty 15, 38d supply, fill #0

## 2024-02-29 MED ORDER — OXYBUTYNIN CHLORIDE 5 MG PO TABS
5.0000 mg | ORAL_TABLET | Freq: Three times a day (TID) | ORAL | 0 refills | Status: DC | PRN
Start: 2024-02-29 — End: 2024-03-31
  Filled 2024-02-29: qty 15, 5d supply, fill #0

## 2024-02-29 MED ORDER — NYSTATIN 100000 UNIT/GM EX POWD
Freq: Three times a day (TID) | CUTANEOUS | 0 refills | Status: DC
  Filled 2024-02-29: qty 15, 14d supply, fill #0

## 2024-02-29 MED ORDER — CYANOCOBALAMIN 1000 MCG PO TABS
1000.0000 ug | ORAL_TABLET | Freq: Every day | ORAL | 0 refills | Status: DC
  Filled 2024-02-29: qty 30, 30d supply, fill #0

## 2024-02-29 MED ORDER — POLYSACCHARIDE IRON COMPLEX 150 MG PO CAPS
150.0000 mg | ORAL_CAPSULE | Freq: Every day | ORAL | 0 refills | Status: DC
  Filled 2024-02-29: qty 30, 30d supply, fill #0

## 2024-02-29 MED ORDER — INSULIN ASPART 100 UNIT/ML IJ SOLN
4.0000 [IU] | Freq: Three times a day (TID) | INTRAMUSCULAR | Status: DC
  Administered 2024-02-29 – 2024-03-01 (×4): 4 [IU] via SUBCUTANEOUS

## 2024-02-29 MED ORDER — INSULIN ASPART 100 UNIT/ML FLEXPEN
8.0000 [IU] | PEN_INJECTOR | Freq: Three times a day (TID) | SUBCUTANEOUS | 0 refills | Status: DC
  Filled 2024-02-29: qty 15, 60d supply, fill #0

## 2024-02-29 MED ORDER — GERHARDT'S BUTT CREAM
1.0000 | TOPICAL_CREAM | Freq: Three times a day (TID) | CUTANEOUS | 0 refills | Status: DC
  Filled 2024-02-29: qty 60, 20d supply, fill #0

## 2024-02-29 MED ORDER — INSULIN PEN NEEDLE 31G X 8 MM MISC
1.0000 | Freq: Three times a day (TID) | 0 refills | Status: DC
  Filled 2024-02-29: qty 100, 30d supply, fill #0

## 2024-02-29 MED ORDER — BISACODYL 5 MG PO TBEC
10.0000 mg | DELAYED_RELEASE_TABLET | Freq: Every day | ORAL | 0 refills | Status: DC
  Filled 2024-02-29: qty 30, 15d supply, fill #0

## 2024-02-29 MED ORDER — INSULIN DETEMIR 100 UNIT/ML FLEXPEN
20.0000 [IU] | PEN_INJECTOR | Freq: Two times a day (BID) | SUBCUTANEOUS | 0 refills | Status: DC
  Filled 2024-02-29: qty 15, 38d supply, fill #0

## 2024-02-29 MED ORDER — FINASTERIDE 5 MG PO TABS
5.0000 mg | ORAL_TABLET | Freq: Every day | ORAL | 0 refills | Status: DC
  Filled 2024-02-29: qty 30, 30d supply, fill #0

## 2024-02-29 MED ORDER — VITAMIN D (ERGOCALCIFEROL) 1.25 MG (50000 UNIT) PO CAPS
50000.0000 [IU] | ORAL_CAPSULE | ORAL | 0 refills | Status: DC
  Filled 2024-02-29: qty 5, 35d supply, fill #0

## 2024-02-29 MED ORDER — CEFADROXIL 500 MG PO CAPS
500.0000 mg | ORAL_CAPSULE | Freq: Two times a day (BID) | ORAL | Status: AC
  Administered 2024-02-29 (×2): 500 mg via ORAL
  Filled 2024-02-29 (×2): qty 1

## 2024-02-29 MED ORDER — BISACODYL 10 MG RE SUPP
10.0000 mg | Freq: Every day | RECTAL | 0 refills | Status: DC | PRN
  Filled 2024-02-29: qty 12, 12d supply, fill #0

## 2024-02-29 MED ORDER — ASCORBIC ACID 500 MG PO TABS
500.0000 mg | ORAL_TABLET | Freq: Every day | ORAL | 0 refills | Status: DC
  Filled 2024-02-29: qty 30, 30d supply, fill #0

## 2024-02-29 MED ORDER — HYDROXYZINE HCL 25 MG PO TABS
25.0000 mg | ORAL_TABLET | Freq: Three times a day (TID) | ORAL | 0 refills | Status: DC | PRN
  Filled 2024-02-29: qty 30, 10d supply, fill #0

## 2024-02-29 MED ORDER — HYDROCERIN EX CREA
1.0000 | TOPICAL_CREAM | Freq: Two times a day (BID) | CUTANEOUS | 0 refills | Status: DC
  Filled 2024-02-29: qty 113, 14d supply, fill #0

## 2024-02-29 NOTE — TOC Transition Note (Addendum)
 Transition of Care [facility]) - Discharge Note   Patient Details  Name: Micheal Moore. MRN: [id number] Date of Birth: 1951/08/27  Transition of Care [facility]) CM/SW Contact:  Omie Bickers, RN Phone Number: 02/29/2024, 2:56 PM   Clinical Narrative:     Patient to DC tomorrow. SPoke w wife to set up needs. She has RW and WC at home would like 3/1. Order and note in, will have to [employer] due to payor. Will request DME delivery to home as patient will be using [facility] for transport. Wife confirms that address on file is accurate.  She would like HH w [facility] I have made referral to them, waiting to hear back   Patient cannot receive BSC as he received one 09/07/2022  Final next level of care: Home/Self Care Barriers to Discharge: No Barriers Identified   Patient Goals and CMS Choice Patient states their goals for this hospitalization and ongoing recovery are:: DC home CMS Medicare.gov Compare Post Acute Care list provided to:: Other (Comment Required) Choice offered to / list presented to : Spouse      Discharge Placement                       Discharge Plan and Services Additional resources added to the After Visit Summary for   In-house Referral: Clinical Social Work   Post Acute Care Choice: Skilled Nursing Facility          DME Arranged: 3-N-1 DME Agency: [employer] Date DME Agency Contacted: 02/29/24 Time DME Agency Contacted: 1453              Social Drivers of Health (SDOH) Interventions SDOH Screenings   Food Insecurity: No Food Insecurity (02/25/2024)  Housing: Low Risk  (02/25/2024)  Transportation Needs: No Transportation Needs (02/25/2024)  Utilities: Not At Risk (02/25/2024)  Social Connections: Socially Integrated (02/25/2024)  Recent Concern: Social Connections - Socially Isolated (01/15/2024)  Tobacco Use: High Risk (02/25/2024)     Readmission Risk Interventions    06/24/2023    3:31 PM 04/25/2023    1:04 PM 04/12/2023    4:47 PM  Readmission  Risk Prevention Plan  Transportation Screening Complete Complete Complete  PCP or Specialist Appt within 3-5 Days  Complete Complete  HRI or Home Care Consult  Complete Complete  Social Work Consult for Recovery Care Planning/Counseling  Complete Complete  Palliative Care Screening  Complete Not Applicable  Medication Review Oceanographer) Complete Complete Referral to Pharmacy  PCP or Specialist appointment within 3-5 days of discharge Complete    HRI or Home Care Consult Complete    SW Recovery Care/Counseling Consult Complete    Palliative Care Screening Complete    Skilled Nursing Facility Complete

## 2024-02-29 NOTE — Progress Notes (Signed)
    Durable Medical Equipment  (From admission, onward)           Start     Ordered   02/29/24 1455  For home use only DME Bedside commode  Once       Comments: Patient confined to room without bathroom  Question:  Patient needs a bedside commode to treat with the following condition  Answer:  Weakness   02/29/24 1454

## 2024-02-29 NOTE — Plan of Care (Signed)

## 2024-02-29 NOTE — Progress Notes (Signed)
 [facility] Progress Note Patient: Micheal Moore. ZOX:[id number] DOB: [birth year] DOA: 02/25/2024  DOS: the patient was seen and examined on 02/29/2024  Brief Hospital Course: 73 year old with a history of CAD status post PCI April 2024, DM2, HLD, BPH with chronic Foley catheter, and PVD status post left AKA who has suffered multiple recent hospitalizations. He was most recently admitted to the hospital 4/29-01/22/2024 for treatment of a severe rash on his back that was ultimately felt to be due to contact dermatitis versus tinea corporis. Following that admission he was discharged to a SNF for a rehab stay. He was recently discharged home from his SNF because he ran out of time per his report. History suggest that he laid in bed without much care at home. EMS was summoned to transport the patient to the ER because of severe persisting buttock and groin pain along with leaking of urine around his Foley catheter and a sensation of urinary retention.  Treated for UTI in the hospital.  Foley catheter was changed on admission.  Assessment and Plan: Sepsis due to UTI.  Catheter associated UTI. Presented with tachycardia, tachypnea, leukocytosis and elevated lactic acid level.  Likely source of infection is UTI. Treated with IV fluid and IV antibiotic. Now on oral antibiotic.   Chronic indwelling Foley catheter. BPH. Catheter asso UTI. Foley catheter was exchanged in the ED on 6/10 UA positive, urine culture growing procidentia Rettgeri, sensitive to ceftriaxone  and Cipro  On Flomax  and Proscar  and Ditropan .   Severe moisture associated skin damage groin and buttocks WOC to be consulted for suggested wound care  S/p one dose of Diflucan  given, continue topical nystatin  and keep skin clean and dry.  Uncontrolled DM2 with long-term insulin  use, with neuropathy and PVD.  With hyperglycemia Currently on Semglee . Ordered home regimen for discharge. Continue diabetic diet   CAD with  history of PCI 2024 Asymptomatic at present Continue with aspirin  and statin   Orthostatic hypotension Previously required midodrine  and Florinef  -no longer being dosed as patient is now essentially bedbound   Relative B12 deficiency. Vitamin B12 level 264 Continue B12 supplementation.   Vitamin D  insufficiency:  started vitamin D  50,000 units p.o. weekly, follow with PCP to repeat vitamin D  level after 3 to 6 months.   failure to thrive Unfortunately unable to go to SNF.  Will arrange home with home health with max assist.  Iron  deficiency,  mild, iron  level 31 slightly low, transferrin saturation 19% WNL Started oral iron  supplement with vitamin C    Constipation, Treated with enema.  Subjective: No nausea no vomiting no fever no chills.  Pain well-controlled.  Physical Exam: General: in Mild distress, No Rash Cardiovascular: S1 and S2 Present, No Murmur Respiratory: Good respiratory effort, Bilateral Air entry present. No Crackles, No wheezes Abdomen: Bowel Sound present, No tenderness Extremities: No edema, left AKA Neuro: Alert and oriented x3, no new focal deficit  Data Reviewed: I have Reviewed nursing notes, Vitals, and Lab results. Since last encounter, pertinent lab results CBC BMP   .   Disposition: Status is: Observation enoxaparin  (LOVENOX ) injection 40 mg Start: 02/25/24 2200   Family Communication: Discussed with wife on the phone. Level of care: Med-Surg   Vitals:   02/28/24 1924 02/29/24 0402 02/29/24 0745 02/29/24 1658  BP: 138/77 139/88 122/76 119/75  Pulse: 92 89 99 95  Resp: 18 16 18 16   Temp: 98.7 F (37.1 C) 98 F (36.7 C) 98.1 F (36.7 C) 98.4 F (36.9 C)  TempSrc: Oral  Oral Oral   SpO2: 98% 96% 98% 97%  Weight:      Height:         Author: Charlean Congress, MD 02/29/2024 7:05 PM  Please look on [email or web address] to find out who is on call.

## 2024-03-01 DIAGNOSIS — R339 Retention of urine, unspecified: Secondary | ICD-10-CM | POA: Diagnosis not present

## 2024-03-01 LAB — CULTURE, BLOOD (ROUTINE X 2)
Culture: NO GROWTH
Culture: NO GROWTH

## 2024-03-01 LAB — GLUCOSE, CAPILLARY
Glucose-Capillary: 260 mg/dL — ABNORMAL HIGH (ref 70–99)
Glucose-Capillary: 290 mg/dL — ABNORMAL HIGH (ref 70–99)

## 2024-03-01 MED ORDER — HYDROXYZINE HCL 25 MG PO TABS
25.0000 mg | ORAL_TABLET | Freq: Three times a day (TID) | ORAL | 0 refills | Status: DC | PRN
Start: 2024-03-01 — End: 2024-05-28

## 2024-03-01 NOTE — Plan of Care (Signed)

## 2024-03-01 NOTE — Progress Notes (Signed)
 DISCHARGE NOTE HOME Micheal Moore. to be discharged Home per MD order. Discussed prescriptions and follow up appointments with the patient. Prescriptions given to patient; medication list explained in detail. Patient verbalized understanding.  Skin clean, dry and intact without evidence of skin break down, no evidence of skin tears noted. IV catheter discontinued intact. Site without signs and symptoms of complications. Dressing and pressure applied. Pt denies pain at the site currently. No complaints noted.  Discharging with Foley  See LDA for wounds  An After Visit Summary (AVS) was printed and given to the patient. Patient escorted via wheelchair, and discharged home via private auto.  Tonda Francisco, RN

## 2024-03-01 NOTE — Plan of Care (Signed)
  Problem: Education: Goal: Knowledge of General Education information will improve Description: Including pain rating scale, medication(s)/side effects and non-pharmacologic comfort measures Outcome: Adequate for Discharge   Problem: Health Behavior/Discharge Planning: Goal: Ability to manage health-related needs will improve Outcome: Adequate for Discharge   Problem: Clinical Measurements: Goal: Ability to maintain clinical measurements within normal limits will improve Outcome: Adequate for Discharge Goal: Will remain free from infection Outcome: Adequate for Discharge Goal: Diagnostic test results will improve Outcome: Adequate for Discharge Goal: Respiratory complications will improve Outcome: Adequate for Discharge Goal: Cardiovascular complication will be avoided Outcome: Adequate for Discharge   Problem: Activity: Goal: Risk for activity intolerance will decrease Outcome: Adequate for Discharge   Problem: Nutrition: Goal: Adequate nutrition will be maintained Outcome: Adequate for Discharge   Problem: Coping: Goal: Level of anxiety will decrease Outcome: Adequate for Discharge   Problem: Elimination: Goal: Will not experience complications related to bowel motility Outcome: Adequate for Discharge Goal: Will not experience complications related to urinary retention Outcome: Adequate for Discharge   Problem: Pain Managment: Goal: General experience of comfort will improve and/or be controlled Outcome: Adequate for Discharge   Problem: Safety: Goal: Ability to remain free from injury will improve Outcome: Adequate for Discharge   Problem: Skin Integrity: Goal: Risk for impaired skin integrity will decrease Outcome: Adequate for Discharge   Problem: Acute Rehab PT Goals(only PT should resolve) Goal: Pt Will Go Supine/Side To Sit Outcome: Adequate for Discharge Goal: Pt Will Go Sit To Supine/Side Outcome: Adequate for Discharge Goal: Patient Will Perform  Sitting Balance Outcome: Adequate for Discharge Goal: Pt Will Transfer Bed To Chair/Chair To Bed Outcome: Adequate for Discharge   Problem: Acute Rehab OT Goals (only OT should resolve) Goal: Pt. Will Perform Grooming Outcome: Adequate for Discharge Goal: Pt. Will Perform Upper Body Dressing Outcome: Adequate for Discharge Goal: Pt. Will Perform Lower Body Dressing Outcome: Adequate for Discharge Goal: Pt. Will Transfer To Toilet Outcome: Adequate for Discharge Goal: Pt/Caregiver Will Perform Home Exercise Program Outcome: Adequate for Discharge

## 2024-03-02 NOTE — Discharge Summary (Signed)
 Physician Discharge Summary   Patient: Micheal Moore. MRN: [id number] DOB: 05/24/1951  Admit date:     02/25/2024  Discharge date: 03/01/2024  Discharge Physician: Charlean Congress  PCP: Pcp, No  Recommendations at discharge: Follow-up with PCP in 1 week. Follow-up with urology as recommended.   Follow-up Information     PCP. Schedule an appointment as soon as possible for a visit in 1 week(s).   Why: With blood sugar log, ask for a referral to skin doctor.        [facility]. Schedule an appointment as soon as possible for a visit in 2 week(s).   Contact information: [address] [place]  [address] [phone]               Discharge Diagnoses: Principal Problem:   Urinary retention  Hospital Course: 73 year old with a history of CAD status post PCI April 2024, DM2, HLD, BPH with chronic Foley catheter, and PVD status post left AKA who has suffered multiple recent hospitalizations. He was most recently admitted to the hospital 4/29-01/22/2024 for treatment of a severe rash on his back that was ultimately felt to be due to contact dermatitis versus tinea corporis. Following that admission he was discharged to a SNF for a rehab stay. He was recently discharged home from his SNF because he ran out of time per his report. History suggest that he laid in bed without much care at home. EMS was summoned to transport the patient to the ER because of severe persisting buttock and groin pain along with leaking of urine around his Foley catheter and a sensation of urinary retention.  Treated for UTI in the hospital.  Foley catheter was changed on admission.   Assessment and Plan: Sepsis due to UTI.  Catheter associated UTI. Presented with tachycardia, tachypnea, leukocytosis and elevated lactic acid level.  Likely source of infection is UTI. Treated with IV fluid and IV antibiotic.  Antibiotics completed in the hospital.   Chronic  indwelling Foley catheter. BPH. Catheter asso UTI. Foley catheter was exchanged in the ED on 6/10 UA positive, urine culture growing procidentia Rettgeri, sensitive to ceftriaxone  and Cipro .  suspect colonization On Flomax  and Proscar  and Ditropan .   Severe moisture associated skin damage groin and buttocks WOC to be consulted for suggested wound care  S/p one dose of Diflucan  given, continue topical nystatin  and keep skin clean and dry.   Uncontrolled DM2 with long-term insulin  use, with neuropathy and PVD.  With hyperglycemia Currently on Semglee . Ordered home regimen for discharge. Continue diabetic diet   CAD with history of PCI 2024 Asymptomatic at present Continue with aspirin  and statin   Orthostatic hypotension Previously required midodrine  and Florinef  -no longer being dosed as patient is now essentially bedbound   Relative B12 deficiency. Vitamin B12 level 264 Continue B12 supplementation.   Vitamin D  insufficiency:  started vitamin D  50,000 units p.o. weekly, follow with PCP to repeat vitamin D  level after 3 to 6 months.   failure to thrive Unfortunately unable to go to SNF.  Will arrange home with home health with max assist.   Iron  deficiency,  mild, iron  level 31 slightly low, transferrin saturation 19% WNL Started oral iron  supplement with vitamin C    Constipation, Treated with enema.   Consultants:  None  Procedures performed:  None  DISCHARGE MEDICATION: Allergies as of 03/01/2024   No Known Allergies      Medication List     STOP taking  these medications    insulin  detemir 100 UNIT/ML FlexPen Commonly known as: LEVEMIR        TAKE these medications    ascorbic acid  500 MG tablet Commonly known as: VITAMIN C  Take 1 tablet (500 mg total) by mouth daily.   aspirin  EC 81 MG tablet Take 1 tablet (81 mg total) by mouth daily. Swallow whole.   atorvastatin  10 MG tablet Commonly known as: LIPITOR  Take 10 mg by mouth 2 (two) times a  week. Mondays and Thursday   bisacodyl  5 MG EC tablet Generic drug: bisacodyl  Take 2 tablets (10 mg total) by mouth at bedtime.   bisacodyl  10 MG suppository Commonly known as: DULCOLAX Place 1 suppository (10 mg total) rectally daily as needed for severe constipation.   BLOOD GLUCOSE TEST STRIPS Strp 1 each by Does not apply route 3 (three) times daily. Use as directed to check blood sugar. May dispense any manufacturer covered by patient's insurance and fits patient's device.   clotrimazole  1 % cream Commonly known as: LOTRIMIN  Apply topically 2 (two) times daily.   cyanocobalamin  1000 MCG tablet Commonly known as: VITAMIN B12 Take 1 tablet (1,000 mcg total) by mouth daily. Start taking on: March 04, 2024   Ensure Max Protein Liqd Take 330 mLs (11 oz total) by mouth 2 (two) times daily.   Ferrex 150 150 MG capsule Generic drug: iron  polysaccharides Take 1 capsule (150 mg total) by mouth daily.   finasteride  5 MG tablet Commonly known as: PROSCAR  Take 1 tablet (5 mg total) by mouth daily.   Gerhardt's butt cream Crea Apply 1 Application topically 3 (three) times daily.   hydrocerin Crea Apply 1 Application topically 2 (two) times daily.   hydrOXYzine  25 MG tablet Commonly known as: ATARAX  Take 1 tablet (25 mg total) by mouth every 8 (eight) hours as needed for itching.   Insupen Pen Needles 31G X 8 MM Misc Generic drug: Insulin  Pen Needle Use 1 each as directed 3 (three) times daily.   Lantus  SoloStar 100 UNIT/ML Solostar Pen Generic drug: insulin  glargine Inject 20 Units into the skin 2 (two) times daily.   NovoLOG  FlexPen 100 UNIT/ML FlexPen Generic drug: insulin  aspart Inject 8 Units into the skin 3 (three) times daily with meals. Only take if eating a meal AND Blood Glucose (BG) is 80 or higher.   nystatin  powder Commonly known as: MYCOSTATIN /NYSTOP  Apply topically 3 (three) times daily.   ondansetron  4 MG tablet Commonly known as: ZOFRAN  Take 4 mg by  mouth every 8 (eight) hours as needed for nausea or vomiting.   oxybutynin  5 MG tablet Commonly known as: DITROPAN  Take 1 tablet (5 mg total) by mouth every 8 (eight) hours as needed for bladder spasms.   polyethylene glycol powder 17 GM/SCOOP powder Commonly known as: GLYCOLAX /MIRALAX  Take 17 g by mouth daily as needed for mild constipation.   rosuvastatin  5 MG tablet Commonly known as: CRESTOR  Take 5 mg by mouth 2 (two) times a week.   silver sulfADIAZINE 1 % cream Commonly known as: SILVADENE Apply 1 Application topically.   tamsulosin  0.4 MG Caps capsule Commonly known as: FLOMAX  Take 1 capsule (0.4 mg total) by mouth daily after supper.   Vitamin D  (Ergocalciferol ) 1.25 MG (50000 UNIT) Caps capsule Commonly known as: DRISDOL  Take 1 capsule (50,000 Units total) by mouth every 7 (seven) days. Start taking on: March 04, 2024               Discharge Care Instructions  (From  admission, onward)           Start     Ordered   03/01/24 0000  Discharge wound care:       Comments: Cleanse buttocks/ischium/groin with Vashe wound cleanser, do not rinse and allow to air dry.  Apply Gerhardt's Butt cream to area 3 times a day and prn soiling.   03/01/24 0949           Disposition: Home Diet recommendation: Regular diet  Discharge Exam: Vitals:   02/29/24 1658 02/29/24 2027 03/01/24 0436 03/01/24 0803  BP: 119/75 124/67 131/85 (!) 147/89  Pulse: 95 93 79 88  Resp: 16 20 19 18   Temp: 98.4 F (36.9 C) 98.3 F (36.8 C) (!) 97.4 F (36.3 C) 97.9 F (36.6 C)  TempSrc:      SpO2: 97% 97% 95% 97%  Weight:      Height:       General: Appear in mild distress; no visible Abnormal Neck Mass Or lumps, Conjunctiva normal Cardiovascular: S1 and S2 Present, no Murmur, Respiratory: good respiratory effort, Bilateral Air entry present and CTA, no Crackles, no wheezes Abdomen: Bowel Sound present, Non tender  Extremities: Left AKA, no pedal edema Neurology: alert and  oriented to time, place, and person  [facility] Weights   02/25/24 0844  Weight: 79.4 kg   Condition at discharge: stable  The results of significant diagnostics from this hospitalization (including imaging, microbiology, ancillary and laboratory) are listed below for reference.   Imaging Studies: DG Chest Port 1 View Result Date: 02/25/2024 CLINICAL DATA:  Questionable sepsis.  Evaluate for abnormality. EXAM: PORTABLE CHEST 1 VIEW COMPARISON:  Radiographs 12/26/2023 and 11/03/2023.  CT 10/01/2022. FINDINGS: 0910 hours. Moderately lower lung volumes with patchy bibasilar opacities favoring atelectasis. There is no confluent airspace disease, significant pleural effusion or pneumothorax. The heart size and mediastinal contours are stable. No acute osseous findings are identified. Telemetry leads overlie the chest. IMPRESSION: Low lung volumes with patchy bibasilar opacities favoring atelectasis. No definite evidence of pneumonia. Electronically Signed   By: Elmon Hagedorn M.D.   On: 02/25/2024 09:20    Microbiology: Results for orders placed or performed during the hospital encounter of 02/25/24  Blood Culture (routine x 2)     Status: None   Collection Time: 02/25/24  8:59 AM   Specimen: BLOOD LEFT WRIST  Result Value Ref Range Status   Specimen Description BLOOD LEFT WRIST  Final   Special Requests   Final    BOTTLES DRAWN AEROBIC AND ANAEROBIC Blood Culture results may not be optimal due to an inadequate volume of blood received in culture bottles   Culture   Final    NO GROWTH 5 DAYS Performed at [facility], [address] [place], [place] [address]    Report Status 03/01/2024 FINAL  Final  Blood Culture (routine x 2)     Status: None   Collection Time: 02/25/24  9:11 AM   Specimen: BLOOD RIGHT WRIST  Result Value Ref Range Status   Specimen Description BLOOD RIGHT WRIST  Final   Special Requests   Final    BOTTLES DRAWN AEROBIC AND ANAEROBIC Blood Culture results may not be  optimal due to an inadequate volume of blood received in culture bottles   Culture   Final    NO GROWTH 5 DAYS Performed at [facility], [address], [place], [place] [address]    Report Status 03/01/2024 FINAL  Final  Urine Culture  Status: Abnormal   Collection Time: 02/25/24 11:39 AM   Specimen: Urine, Catheterized  Result Value Ref Range Status   Specimen Description URINE, CATHETERIZED  Final   Special Requests   Final    NONE Reflexed from [id number] Performed at [facility], [address] [place], [place] [address]    Culture >=100,000 COLONIES/mL PROVIDENCIA RETTGERI (A)  Final   Report Status 02/27/2024 FINAL  Final   Organism ID, Bacteria PROVIDENCIA RETTGERI (A)  Final      Susceptibility   Providencia rettgeri - MIC*    AMPICILLIN >=32 RESISTANT Resistant     CEFEPIME  <=0.12 SENSITIVE Sensitive     CEFTRIAXONE  <=0.25 SENSITIVE Sensitive     CIPROFLOXACIN  <=0.25 SENSITIVE Sensitive     GENTAMICIN <=1 SENSITIVE Sensitive     IMIPENEM 2 SENSITIVE Sensitive     NITROFURANTOIN 256 RESISTANT Resistant     TRIMETH/SULFA <=20 SENSITIVE Sensitive     AMPICILLIN/SULBACTAM >=32 RESISTANT Resistant     PIP/TAZO <=4 SENSITIVE Sensitive ug/mL    * >=100,000 COLONIES/mL PROVIDENCIA RETTGERI  MRSA Next Gen by PCR, Nasal     Status: Abnormal   Collection Time: 02/26/24  9:14 AM   Specimen: Nasal Mucosa; Nasal Swab  Result Value Ref Range Status   MRSA by PCR Next Gen DETECTED (A) NOT DETECTED Final    Comment: RESULT CALLED TO, READ BACK BY AND VERIFIED WITH: RN MOLLY SOCIN ON 02/26/24 @ 1438 BY DRT (NOTE) The GeneXpert MRSA Assay (FDA approved for NASAL specimens only), is one component of a comprehensive MRSA colonization surveillance program. It is not intended to diagnose MRSA infection nor to guide or monitor treatment for MRSA infections. Test performance is not FDA approved in patients less than 5 years old. Performed at [facility], [address] [place], [place] [address]    Labs: CBC: Recent Labs  Lab 02/25/24 [phone] 02/26/24 0444 02/27/24 0450 02/28/24 0555 02/29/24 0455  WBC 11.8* 5.7 5.5 4.8 4.9  NEUTROABS 9.8*  --   --   --   --   HGB 13.8 11.4* 10.9* 11.2* 12.0*  HCT 41.7 34.4* 33.3* 34.3* 36.6*  MCV 92.3 91.7 93.3 93.7 92.4  PLT 311 241 234 249 258   Basic Metabolic Panel: Recent Labs  Lab 02/25/24 0859 02/26/24 0444 02/26/24 1000 02/27/24 0450 02/28/24 0555 02/29/24 0455  NA 133* 134*  --  133* 134* 136  K 4.1 4.0  --  4.2 4.6 4.4  CL 98 104  --  101 101 101  CO2 23 26  --  26 24 23   GLUCOSE 338* 337*  --  274* 330* 230*  BUN 22 21  --  21 19 19   CREATININE 1.04 0.73  --  0.62 0.71 0.63  CALCIUM  9.6 8.9  --  8.8* 9.2 9.3  MG  --   --  1.8 1.9 2.0 2.1  PHOS  --   --  3.5 3.9 3.8 4.1   Liver Function Tests: Recent Labs  Lab 02/25/24 0859  AST 15  ALT 11  ALKPHOS 109  BILITOT 0.9  PROT 7.6  ALBUMIN  3.6   CBG: Recent Labs  Lab 02/29/24 1116 02/29/24 1658 02/29/24 2028 03/01/24 0800 03/01/24 1138  GLUCAP 323* 248* 282* 260* 290*    Discharge time spent: greater than 30 minutes.  Author: Charlean Congress, MD  [facility] 03/01/2024

## 2024-03-31 ENCOUNTER — Encounter (HOSPITAL_COMMUNITY): Payer: Self-pay

## 2024-03-31 ENCOUNTER — Emergency Department (HOSPITAL_COMMUNITY)
Admission: EM | Admit: 2024-03-31 | Discharge: 2024-04-01 | Disposition: A | Discharge status: Home / Self Care | Attending: Emergency Medicine | Admitting: Emergency Medicine

## 2024-03-31 ENCOUNTER — Other Ambulatory Visit: Payer: Self-pay

## 2024-03-31 DIAGNOSIS — T83511A Infection and inflammatory reaction due to indwelling urethral catheter, initial encounter: Secondary | ICD-10-CM | POA: Diagnosis not present

## 2024-03-31 DIAGNOSIS — N390 Urinary tract infection, site not specified: Secondary | ICD-10-CM | POA: Insufficient documentation

## 2024-03-31 DIAGNOSIS — E119 Type 2 diabetes mellitus without complications: Secondary | ICD-10-CM | POA: Diagnosis not present

## 2024-03-31 DIAGNOSIS — Z7982 Long term (current) use of aspirin: Secondary | ICD-10-CM | POA: Insufficient documentation

## 2024-03-31 DIAGNOSIS — Z794 Long term (current) use of insulin: Secondary | ICD-10-CM | POA: Diagnosis not present

## 2024-03-31 DIAGNOSIS — I2510 Atherosclerotic heart disease of native coronary artery without angina pectoris: Secondary | ICD-10-CM | POA: Diagnosis not present

## 2024-03-31 DIAGNOSIS — Z89612 Acquired absence of left leg above knee: Secondary | ICD-10-CM | POA: Diagnosis not present

## 2024-03-31 DIAGNOSIS — R300 Dysuria: Secondary | ICD-10-CM | POA: Diagnosis not present

## 2024-03-31 LAB — URINALYSIS, W/ REFLEX TO CULTURE (INFECTION SUSPECTED)
Bilirubin Urine: NEGATIVE
Glucose, UA: NEGATIVE mg/dL
Ketones, ur: NEGATIVE mg/dL
Nitrite: NEGATIVE
Protein, ur: 100 mg/dL — AB
RBC / HPF: >50 RBC/hpf (ref 0–5)
Specific Gravity, Urine: 1.021 (ref 1.005–1.030)
WBC, UA: >50 WBC/hpf (ref 0–5)
pH: 5 (ref 5.0–8.0)

## 2024-03-31 LAB — BASIC METABOLIC PANEL WITH GFR
Anion gap: 12 (ref 5–15)
BUN: 15 mg/dL (ref 8–23)
CO2: 23 mmol/L (ref 22–32)
Calcium: 9.3 mg/dL (ref 8.9–10.3)
Chloride: 101 mmol/L (ref 98–111)
Creatinine, Ser: 0.76 mg/dL (ref 0.61–1.24)
GFR, Estimated: >60 mL/min (ref >60)
Glucose, Bld: 140 mg/dL — ABNORMAL HIGH (ref 70–99)
Potassium: 4.3 mmol/L (ref 3.5–5.1)
Sodium: 136 mmol/L (ref 135–145)

## 2024-03-31 MED ORDER — CYANOCOBALAMIN 1000 MCG PO TABS
1000.0000 ug | ORAL_TABLET | Freq: Every day | ORAL | 0 refills | Status: DC
  Filled 2024-03-31 – 2024-04-01 (×2): qty 30, 30d supply, fill #0

## 2024-03-31 MED ORDER — OXYBUTYNIN CHLORIDE 5 MG PO TABS
5.0000 mg | ORAL_TABLET | Freq: Three times a day (TID) | ORAL | 0 refills | Status: AC | PRN
  Filled 2024-03-31 – 2024-04-01 (×2): qty 15, 5d supply, fill #0

## 2024-03-31 MED ORDER — VITAMIN D (ERGOCALCIFEROL) 1.25 MG (50000 UNIT) PO CAPS
50000.0000 [IU] | ORAL_CAPSULE | ORAL | 0 refills | Status: DC
  Filled 2024-03-31 – 2024-04-01 (×2): qty 5, 35d supply, fill #0

## 2024-03-31 MED ORDER — BISACODYL 5 MG PO TBEC
10.0000 mg | DELAYED_RELEASE_TABLET | Freq: Every day | ORAL | 0 refills | Status: DC
  Filled 2024-03-31 – 2024-04-01 (×2): qty 30, 15d supply, fill #0

## 2024-03-31 MED ORDER — FINASTERIDE 5 MG PO TABS
5.0000 mg | ORAL_TABLET | Freq: Every day | ORAL | 0 refills | Status: DC
  Filled 2024-03-31 – 2024-04-01 (×2): qty 30, 30d supply, fill #0

## 2024-03-31 MED ORDER — ASCORBIC ACID 500 MG PO TABS
500.0000 mg | ORAL_TABLET | Freq: Every day | ORAL | 0 refills | Status: DC
  Filled 2024-03-31 – 2024-04-01 (×2): qty 30, 30d supply, fill #0

## 2024-03-31 MED ORDER — GERHARDT'S BUTT CREAM
1.0000 | TOPICAL_CREAM | Freq: Three times a day (TID) | CUTANEOUS | 0 refills | Status: DC
  Filled 2024-03-31 – 2024-04-01 (×2): qty 60, 20d supply, fill #0

## 2024-03-31 MED ORDER — SODIUM CHLORIDE 0.9 % IV SOLN
1.0000 g | Freq: Once | INTRAVENOUS | Status: AC
  Administered 2024-03-31: 1 g via INTRAVENOUS
  Filled 2024-03-31: qty 10

## 2024-03-31 MED ORDER — INSULIN PEN NEEDLE 32G X 4 MM MISC
1.0000 | Freq: Three times a day (TID) | 0 refills | Status: DC
  Filled 2024-03-31 – 2024-04-01 (×2): qty 100, 30d supply, fill #0

## 2024-03-31 MED ORDER — LINEZOLID 600 MG PO TABS
600.0000 mg | ORAL_TABLET | Freq: Two times a day (BID) | ORAL | 0 refills | Status: DC
Start: 2024-03-31 — End: 2024-05-28
  Filled 2024-03-31 – 2024-04-01 (×2): qty 20, 10d supply, fill #0

## 2024-03-31 MED ORDER — NYSTATIN 100000 UNIT/GM EX POWD
Freq: Three times a day (TID) | CUTANEOUS | 0 refills | Status: DC
  Filled 2024-03-31: qty 15, 14d supply, fill #0
  Filled 2024-04-01: qty 15, 5d supply, fill #0

## 2024-03-31 MED ORDER — CEFPODOXIME PROXETIL 200 MG PO TABS
200.0000 mg | ORAL_TABLET | Freq: Two times a day (BID) | ORAL | 0 refills | Status: DC
  Filled 2024-03-31 – 2024-04-01 (×2): qty 20, 10d supply, fill #0

## 2024-03-31 NOTE — ED Provider Notes (Signed)
 [place] EMERGENCY DEPARTMENT AT [facility] Provider Note   CSN: [id number] Arrival date & time: 03/31/24  8147     History  Chief Complaint  Patient presents with   Dysuria    Micheal Moore. is a 73 y.o. male with CAD status post PCI April 2024, DM2, HLD, BPH with chronic Foley catheter, and PVD status post left AKA  who presents with severe dysuria for 5.5 hours. Pt has foley. Urine is cloudy and malodorous.  Patient was recently mid to the hospital from 02/25/2024 to 03/01/2024 with urosepsis d/t CAUTI. He denies abd pain, flank pain, f/c, SOB, CP, or blood in the catheter. Denies trauma to the catheter. No nausea/vomiting.  Patient also hands me a stack of prescriptions stating that he has run out of several of his medications and requests a refill.    Past Medical History:  Diagnosis Date   Cancer [facility])    right elbow melanoma   Cellulitis in diabetic foot (HCC) 04/27/2020   Diabetes mellitus without complication (HCC)    Type II   Diabetic ketoacidosis without coma associated with type 2 diabetes mellitus (HCC) 01/15/2023   Dizziness    DVT (deep venous thrombosis) (HCC)    right leg   Elevated troponin 04/11/2023   Fall    Frequent falls    Hip fracture (HCC) 02/22/2022   Hypokalemia 04/11/2023   Near syncope    Osteomyelitis (HCC) 06/19/2023   Osteomyelitis of great toe of left foot (HCC) 04/27/2020   Syncope        Home Medications Prior to Admission medications   Medication Sig Start Date End Date Taking? Authorizing Provider  ascorbic acid  (VITAMIN C ) 500 MG tablet Take 1 tablet (500 mg total) by mouth daily. 03/31/24   Franklyn Sid SAILOR, MD  aspirin  EC 81 MG tablet Take 1 tablet (81 mg total) by mouth daily. Swallow whole. 01/20/23   Christobal Guadalajara, MD  atorvastatin  (LIPITOR ) 10 MG tablet Take 10 mg by mouth 2 (two) times a week. Mondays and Thursday    [provider]  bisacodyl  (DULCOLAX) 5 MG EC tablet Take 2 tablets (10 mg total)  by mouth at bedtime. 03/31/24   Franklyn Sid SAILOR, MD  clotrimazole  (LOTRIMIN ) 1 % cream Apply topically 2 (two) times daily. 01/22/24   Gherghe, Costin M, MD  cyanocobalamin  1000 MCG tablet Take 1 tablet (1,000 mcg total) by mouth daily. 03/31/24   Franklyn Sid SAILOR, MD  Ensure Max Protein (ENSURE MAX PROTEIN) LIQD Take 330 mLs (11 oz total) by mouth 2 (two) times daily. 08/28/22   Arlice Reichert, MD  finasteride  (PROSCAR ) 5 MG tablet Take 1 tablet (5 mg total) by mouth daily. 03/31/24   Franklyn Sid SAILOR, MD  Glucose Blood (BLOOD GLUCOSE TEST STRIPS) STRP 1 each by Does not apply route 3 (three) times daily. Use as directed to check blood sugar. May dispense any manufacturer covered by patient's insurance and fits patient's device. 01/20/23   Christobal Guadalajara, MD  hydrocerin (EUCERIN) CREA Apply 1 Application topically 2 (two) times daily. 02/29/24   Tobie Yetta HERO, MD  hydrOXYzine  (ATARAX ) 25 MG tablet Take 1 tablet (25 mg total) by mouth every 8 (eight) hours as needed for itching. 03/01/24   Tobie Yetta HERO, MD  insulin  aspart (NOVOLOG ) 100 UNIT/ML FlexPen Inject 8 Units into the skin 3 (three) times daily with meals. Only take if eating a meal AND Blood Glucose (BG) is 80 or higher. 02/29/24 05/29/24  Tobie Yetta HERO, MD  insulin  glargine (LANTUS ) 100 UNIT/ML Solostar Pen Inject 20 Units into the skin 2 (two) times daily. 02/29/24   Tobie Yetta HERO, MD  Insulin  Pen Needle 31G X 8 MM MISC Use 1 each as directed 3 (three) times daily. 03/31/24   Franklyn Sid SAILOR, MD  iron  polysaccharides (NIFEREX) 150 MG capsule Take 1 capsule (150 mg total) by mouth daily. 03/01/24   Tobie Yetta HERO, MD  Nystatin  (GERHARDT'S BUTT CREAM) CREA Apply 1 Application topically 3 (three) times daily. 03/31/24   Franklyn Sid SAILOR, MD  nystatin  (MYCOSTATIN /NYSTOP ) powder Apply topically 3 (three) times daily. 03/31/24   Franklyn Sid SAILOR, MD  ondansetron  (ZOFRAN ) 4 MG tablet Take 4 mg by mouth every 8 (eight) hours as needed for nausea or vomiting.  11/13/23   [provider]  oxybutynin  (DITROPAN ) 5 MG tablet Take 1 tablet (5 mg total) by mouth every 8 (eight) hours as needed for bladder spasms. 03/31/24   Franklyn Sid SAILOR, MD  polyethylene glycol powder (GLYCOLAX /MIRALAX ) 17 GM/SCOOP powder Take 17 g by mouth daily as needed for mild constipation. 12/31/23   Elpidio Reyes DEL, MD  rosuvastatin  (CRESTOR ) 5 MG tablet Take 5 mg by mouth 2 (two) times a week. 11/13/23   [provider]  silver sulfADIAZINE (SILVADENE) 1 % cream Apply 1 Application topically. 02/26/24   [provider]  tamsulosin  (FLOMAX ) 0.4 MG CAPS capsule Take 1 capsule (0.4 mg total) by mouth daily after supper. 06/30/23   Amin, Ankit C, MD  Vitamin D , Ergocalciferol , (DRISDOL ) 1.25 MG (50000 UNIT) CAPS capsule Take 1 capsule (50,000 Units total) by mouth every 7 (seven) days. 03/31/24   Franklyn Sid SAILOR, MD      Allergies    Patient has no known allergies.    Review of Systems   Review of Systems A 10 point review of systems was performed and is negative unless otherwise reported in HPI.  Physical Exam Updated Vital Signs BP 112/70 (BP Location: Right Arm)   Pulse 98   Temp 98.6 F (37 C) (Oral)   Resp 15   Ht 6' 5 (1.956 m)   Wt 79.4 kg   SpO2 98%   BMI 20.76 kg/m  Physical Exam General: Normal appearing male, lying in bed.  HEENT: PERRLA, Sclera anicteric, MMM, trachea midline.  Cardiology: RRR, no murmurs/rubs/gallops. Resp: Normal respiratory rate and effort. CTAB, no wheezes, rhonchi, crackles.  Abd: Soft, non-tender, non-distended. No rebound tenderness or guarding.  GU: Foley catheter in place with no surrounding erythema/induration or blood at urethral meatus. Performed w/ RN chaperone. MSK: No peripheral edema or signs of trauma. S/p L AKA. Skin: warm, dry.  Back: No CVA tenderness Neuro: A&Ox4, CNs II-XII grossly intact. MAEs. Sensation grossly intact.  Psych: Normal mood and affect.   ED Results / Procedures / Treatments    Labs (all labs ordered are listed, but only abnormal results are displayed) Labs Reviewed  URINALYSIS, W/ REFLEX TO CULTURE (INFECTION SUSPECTED) - Abnormal; Notable for the following components:      Result Value   APPearance CLOUDY (*)    Hgb urine dipstick LARGE (*)    Protein, ur 100 (*)    Leukocytes,Ua MODERATE (*)    Bacteria, UA FEW (*)    All other components within normal limits  URINE CULTURE  CBC WITH DIFFERENTIAL/PLATELET  BASIC METABOLIC PANEL WITH GFR  CBC WITH DIFFERENTIAL/PLATELET    EKG None  Radiology No results found.  Procedures Procedures  Medications Ordered in ED Medications  cefTRIAXone  (ROCEPHIN ) 1 g in sodium chloride  0.9 % 100 mL IVPB (has no administration in time range)    ED Course/ Medical Decision Making/ A&P                          Medical Decision Making Amount and/or Complexity of Data Reviewed Labs: ordered. Decision-making details documented in ED Course.  Risk OTC drugs. Prescription drug management.    This patient presents to the ED for concern of dysuria a/w indwelling foley, this involves an extensive number of treatment options, and is a complaint that carries with it a high risk of complications and morbidity.  I considered the following differential and admission for this acute, potentially life threatening condition. Overall well, non-toxic appearing.  MDM:    Catheter was exchanged and a new catheter was placed with improvement in patient's symptoms. UA shows +UTI. He has no flank pain to indicate pyelonephritis. No abd pain either, reassuring abdominal exam. Very well-appearing overall and afebrile. Will obtain labs as patient was recently hospitalized very ill from UTI.   I also ordered refills of patient's medications as he requested.  Clinical Course as of 03/31/24 2331  Tue Mar 31, 2024  2249 Urinalysis, w/ Reflex to Culture (Infection Suspected) -Urine, Clean Catch(!) +CAUTI, discussed with pharmacy  given prior culture data recommends linezolid /cefpodoxime  as o/p fi patient is well. Will start with dose of ceftriaxone  here and if labs are reassuring can be dc'd with linezolid /cefpodoxime . [HN]    Clinical Course User Index [HN] Franklyn Sid SAILOR, MD    Labs: I Ordered, and personally interpreted labs.  The pertinent results include:  those listed above  Additional history obtained from chart review.   Reevaluation: After the interventions noted above, I reevaluated the patient and found that they have :improved  Social Determinants of Health: Lives independently  Disposition:  Patient is signed out to the oncoming ED physician who is made aware of his history, presentation, exam, workup, and plan.    Co morbidities that complicate the patient evaluation  Past Medical History:  Diagnosis Date   Cancer (HCC)    right elbow melanoma   Cellulitis in diabetic foot (HCC) 04/27/2020   Diabetes mellitus without complication (HCC)    Type II   Diabetic ketoacidosis without coma associated with type 2 diabetes mellitus (HCC) 01/15/2023   Dizziness    DVT (deep venous thrombosis) (HCC)    right leg   Elevated troponin 04/11/2023   Fall    Frequent falls    Hip fracture (HCC) 02/22/2022   Hypokalemia 04/11/2023   Near syncope    Osteomyelitis (HCC) 06/19/2023   Osteomyelitis of great toe of left foot (HCC) 04/27/2020   Syncope      Medicines Meds ordered this encounter  Medications   finasteride  (PROSCAR ) 5 MG tablet    Sig: Take 1 tablet (5 mg total) by mouth daily.    Dispense:  30 tablet    Refill:  0   bisacodyl  (DULCOLAX) 5 MG EC tablet    Sig: Take 2 tablets (10 mg total) by mouth at bedtime.    Dispense:  30 tablet    Refill:  0   ascorbic acid  (VITAMIN C ) 500 MG tablet    Sig: Take 1 tablet (500 mg total) by mouth daily.    Dispense:  30 tablet    Refill:  0   cyanocobalamin  1000 MCG tablet  Sig: Take 1 tablet (1,000 mcg total) by mouth daily.    Dispense:   30 tablet    Refill:  0   Nystatin  (GERHARDT'S BUTT CREAM) CREA    Sig: Apply 1 Application topically 3 (three) times daily.    Dispense:  60 each    Refill:  0   nystatin  (MYCOSTATIN /NYSTOP ) powder    Sig: Apply topically 3 (three) times daily.    Dispense:  15 g    Refill:  0   Vitamin D , Ergocalciferol , (DRISDOL ) 1.25 MG (50000 UNIT) CAPS capsule    Sig: Take 1 capsule (50,000 Units total) by mouth every 7 (seven) days.    Dispense:  5 capsule    Refill:  0   oxybutynin  (DITROPAN ) 5 MG tablet    Sig: Take 1 tablet (5 mg total) by mouth every 8 (eight) hours as needed for bladder spasms.    Dispense:  15 tablet    Refill:  0   Insulin  Pen Needle 31G X 8 MM MISC    Sig: Use 1 each as directed 3 (three) times daily.    Dispense:  100 each    Refill:  0   cefTRIAXone  (ROCEPHIN ) 1 g in sodium chloride  0.9 % 100 mL IVPB    Antibiotic Indication::   UTI    I have reviewed the patients home medicines and have made adjustments as needed  Problem List / ED Course: Problem List Items Addressed This Visit   None Visit Diagnoses       Urinary tract infection associated with indwelling urethral catheter, initial encounter (HCC)    -  Primary   Relevant Medications   Nystatin  (GERHARDT'S BUTT CREAM) CREA   nystatin  (MYCOSTATIN /NYSTOP ) powder   cefTRIAXone  (ROCEPHIN ) 1 g in sodium chloride  0.9 % 100 mL IVPB                   This note was created using dictation software, which may contain spelling or grammatical errors.    Franklyn Sid SAILOR, MD 03/31/24 [phone]

## 2024-03-31 NOTE — ED Triage Notes (Addendum)
 C/O dysuria for 5.5 hours. Pt has foley. Urine is cloudy and malodorous. VSS.

## 2024-04-01 ENCOUNTER — Other Ambulatory Visit (HOSPITAL_COMMUNITY): Payer: Self-pay

## 2024-04-01 DIAGNOSIS — Z7401 Bed confinement status: Secondary | ICD-10-CM | POA: Diagnosis not present

## 2024-04-01 DIAGNOSIS — R531 Weakness: Secondary | ICD-10-CM | POA: Diagnosis not present

## 2024-04-01 LAB — CBC WITH DIFFERENTIAL/PLATELET
Abs Immature Granulocytes: 0.02 K/uL (ref 0.00–0.07)
Basophils Absolute: 0.1 K/uL (ref 0.0–0.1)
Basophils Relative: 1 %
Eosinophils Absolute: 0.3 K/uL (ref 0.0–0.5)
Eosinophils Relative: 6 %
HCT: 35.7 % — ABNORMAL LOW (ref 39.0–52.0)
Hemoglobin: 11.5 g/dL — ABNORMAL LOW (ref 13.0–17.0)
Immature Granulocytes: 0 %
Lymphocytes Relative: 32 %
Lymphs Abs: 1.8 K/uL (ref 0.7–4.0)
MCH: 31.1 pg (ref 26.0–34.0)
MCHC: 32.2 g/dL (ref 30.0–36.0)
MCV: 96.5 fL (ref 80.0–100.0)
Monocytes Absolute: 0.4 K/uL (ref 0.1–1.0)
Monocytes Relative: 7 %
Neutro Abs: 3 K/uL (ref 1.7–7.7)
Neutrophils Relative %: 54 %
Platelets: 134 K/uL — ABNORMAL LOW (ref 150–400)
RBC: 3.7 MIL/uL — ABNORMAL LOW (ref 4.22–5.81)
RDW: 13.2 % (ref 11.5–15.5)
WBC: 5.6 K/uL (ref 4.0–10.5)
nRBC: 0 % (ref 0.0–0.2)

## 2024-04-02 ENCOUNTER — Emergency Department (HOSPITAL_COMMUNITY)
Admission: EM | Admit: 2024-04-02 | Discharge: 2024-04-03 | Disposition: A | Discharge status: Home / Self Care | Attending: Emergency Medicine | Admitting: Emergency Medicine

## 2024-04-02 ENCOUNTER — Encounter (HOSPITAL_COMMUNITY): Payer: Self-pay

## 2024-04-02 DIAGNOSIS — T8384XD Pain from genitourinary prosthetic devices, implants and grafts, subsequent encounter: Secondary | ICD-10-CM | POA: Insufficient documentation

## 2024-04-02 DIAGNOSIS — Y828 Other medical devices associated with adverse incidents: Secondary | ICD-10-CM | POA: Insufficient documentation

## 2024-04-02 DIAGNOSIS — N390 Urinary tract infection, site not specified: Secondary | ICD-10-CM | POA: Diagnosis not present

## 2024-04-02 DIAGNOSIS — R300 Dysuria: Secondary | ICD-10-CM | POA: Diagnosis not present

## 2024-04-02 DIAGNOSIS — Z794 Long term (current) use of insulin: Secondary | ICD-10-CM | POA: Diagnosis not present

## 2024-04-02 DIAGNOSIS — Z7982 Long term (current) use of aspirin: Secondary | ICD-10-CM | POA: Diagnosis not present

## 2024-04-02 LAB — URINE CULTURE: Culture: 60000 — AB

## 2024-04-02 MED ORDER — CIPROFLOXACIN HCL 500 MG PO TABS
500.0000 mg | ORAL_TABLET | Freq: Two times a day (BID) | ORAL | 0 refills | Status: AC
  Filled 2024-04-02: qty 14, 7d supply, fill #0

## 2024-04-02 MED ORDER — LIDOCAINE VISCOUS HCL 2 % MT SOLN
15.0000 mL | OROMUCOSAL | 0 refills | Status: DC | PRN
  Filled 2024-04-02: qty 100, 4d supply, fill #0

## 2024-04-02 MED ORDER — CIPROFLOXACIN HCL 500 MG PO TABS
500.0000 mg | ORAL_TABLET | Freq: Once | ORAL | Status: AC
  Administered 2024-04-02: 500 mg via ORAL
  Filled 2024-04-02: qty 1

## 2024-04-02 MED ORDER — OXYBUTYNIN CHLORIDE 5 MG PO TABS
5.0000 mg | ORAL_TABLET | Freq: Once | ORAL | Status: AC
  Administered 2024-04-02: 5 mg via ORAL
  Filled 2024-04-02: qty 1

## 2024-04-02 NOTE — ED Provider Notes (Signed)
 [place] EMERGENCY DEPARTMENT AT [facility] Provider Note   CSN: [id number] Arrival date & time: 04/02/24  8090     Patient presents with: Catheter Problem    Micheal Moore. is a 73 y.o. male.   73 year old male here today with pain related to his Foley catheter.  Patient has had an indwelling Foley catheter for several months, came in 2 days ago due to the same symptoms, and his Foley catheter swapped out, had a urine culture sent.  Urine culture grew Pseudomonas.  Patient had been discharged with cefpodoxime  and linezolid .        Prior to Admission medications   Medication Sig Start Date End Date Taking? Authorizing Provider  ciprofloxacin  (CIPRO ) 500 MG tablet Take 1 tablet (500 mg total) by mouth every 12 (twelve) hours for 7 days. 04/02/24 04/09/24 Yes Mannie Pac T, DO  lidocaine  (XYLOCAINE ) 2 % solution Use as directed 15 mLs in the mouth or throat as needed for mouth pain. 04/02/24  Yes Mannie Pac T, DO  ascorbic acid  (VITAMIN C ) 500 MG tablet Take 1 tablet (500 mg total) by mouth daily. 03/31/24   Franklyn Sid SAILOR, MD  aspirin  EC 81 MG tablet Take 1 tablet (81 mg total) by mouth daily. Swallow whole. 01/20/23   Christobal Guadalajara, MD  atorvastatin  (LIPITOR ) 10 MG tablet Take 10 mg by mouth 2 (two) times a week. Mondays and Thursday    [provider]  bisacodyl  (DULCOLAX) 5 MG EC tablet Take 2 tablets (10 mg total) by mouth at bedtime. 03/31/24   Franklyn Sid SAILOR, MD  cefpodoxime  (VANTIN ) 200 MG tablet Take 1 tablet (200 mg total) by mouth 2 (two) times daily. 03/31/24   Palumbo, April, MD  clotrimazole  (LOTRIMIN ) 1 % cream Apply topically 2 (two) times daily. 01/22/24   Gherghe, Costin M, MD  cyanocobalamin  1000 MCG tablet Take 1 tablet (1,000 mcg total) by mouth daily. 03/31/24   Franklyn Sid SAILOR, MD  Ensure Max Protein (ENSURE MAX PROTEIN) LIQD Take 330 mLs (11 oz total) by mouth 2 (two) times daily. 08/28/22   Arlice Reichert, MD  finasteride  (PROSCAR )  5 MG tablet Take 1 tablet (5 mg total) by mouth daily. 03/31/24   Franklyn Sid SAILOR, MD  Glucose Blood (BLOOD GLUCOSE TEST STRIPS) STRP 1 each by Does not apply route 3 (three) times daily. Use as directed to check blood sugar. May dispense any manufacturer covered by patient's insurance and fits patient's device. 01/20/23   Christobal Guadalajara, MD  hydrocerin (EUCERIN) CREA Apply 1 Application topically 2 (two) times daily. 02/29/24   Tobie Yetta HERO, MD  hydrOXYzine  (ATARAX ) 25 MG tablet Take 1 tablet (25 mg total) by mouth every 8 (eight) hours as needed for itching. 03/01/24   Tobie Yetta HERO, MD  insulin  aspart (NOVOLOG ) 100 UNIT/ML FlexPen Inject 8 Units into the skin 3 (three) times daily with meals. Only take if eating a meal AND Blood Glucose (BG) is 80 or higher. 02/29/24 05/29/24  Tobie Yetta HERO, MD  insulin  glargine (LANTUS ) 100 UNIT/ML Solostar Pen Inject 20 Units into the skin 2 (two) times daily. 02/29/24   Tobie Yetta HERO, MD  Insulin  Pen Needle 32G X 4 MM MISC Use 3 (three) times daily with insulin  pen. 03/31/24   Franklyn Sid SAILOR, MD  iron  polysaccharides (NIFEREX) 150 MG capsule Take 1 capsule (150 mg total) by mouth daily. 03/01/24   Tobie Yetta HERO, MD  linezolid  (ZYVOX ) 600 MG tablet Take 1  tablet (600 mg total) by mouth 2 (two) times daily. 03/31/24   Palumbo, April, MD  Nystatin  (GERHARDT'S BUTT CREAM) CREA Apply 1 Application topically 3 (three) times daily. 03/31/24   Franklyn Sid SAILOR, MD  nystatin  (MYCOSTATIN /NYSTOP ) powder Apply topically 3 (three) times daily. 03/31/24   Franklyn Sid SAILOR, MD  ondansetron  (ZOFRAN ) 4 MG tablet Take 4 mg by mouth every 8 (eight) hours as needed for nausea or vomiting. 11/13/23   [provider]  oxybutynin  (DITROPAN ) 5 MG tablet Take 1 tablet (5 mg total) by mouth every 8 (eight) hours as needed for bladder spasms. 03/31/24   Franklyn Sid SAILOR, MD  polyethylene glycol powder (GLYCOLAX /MIRALAX ) 17 GM/SCOOP powder Take 17 g by mouth daily as needed for mild  constipation. 12/31/23   Elpidio Reyes DEL, MD  rosuvastatin  (CRESTOR ) 5 MG tablet Take 5 mg by mouth 2 (two) times a week. 11/13/23   [provider]  silver sulfADIAZINE (SILVADENE) 1 % cream Apply 1 Application topically. 02/26/24   [provider]  tamsulosin  (FLOMAX ) 0.4 MG CAPS capsule Take 1 capsule (0.4 mg total) by mouth daily after supper. 06/30/23   Amin, Ankit C, MD  Vitamin D , Ergocalciferol , (DRISDOL ) 1.25 MG (50000 UNIT) CAPS capsule Take 1 capsule (50,000 Units total) by mouth every 7 (seven) days. 03/31/24   Franklyn Sid SAILOR, MD    Allergies: Patient has no known allergies.    Review of Systems  Updated Vital Signs BP 129/85 (BP Location: Left Arm)   Pulse 95   Temp 98.2 F (36.8 C) (Oral)   Resp 20   SpO2 98%   Physical Exam Vitals and nursing note reviewed.  Cardiovascular:     Rate and Rhythm: Normal rate.  Abdominal:     General: Abdomen is flat. There is no distension.     Palpations: Abdomen is soft.     Tenderness: There is no abdominal tenderness. There is no guarding.  Genitourinary:    Penis: Normal.      Testes: Normal.  Musculoskeletal:        General: Normal range of motion.  Neurological:     Mental Status: He is alert.     (all labs ordered are listed, but only abnormal results are displayed) Labs Reviewed - No data to display  EKG: None  Radiology: No results found.   Procedures   Medications Ordered in the ED  oxybutynin  (DITROPAN ) tablet 5 mg (5 mg Oral Given 04/02/24 2009)  ciprofloxacin  (CIPRO ) tablet 500 mg (500 mg Oral Given 04/02/24 2009)                                    Medical Decision Making 73 year old male here today with pain related to his Foley catheter.  Differential diagnoses include bladder spasm, catheter associated UTI, local irritation, displaced Foley catheter.  Plan-patient fully draining clear urine, balloon visualized in patient's bladder on bedside ultrasound.  Reviewed his micro,  patient likely should be switched to Cipro  due to Pseudomonas growth.  This is different from the patient's prior cultures.  Linezolid  without great coverage.  No irritation to the penis.  Could be some bladder spasm as well.  Will discharge with oxybutynin  and Cipro .  He will follow-up with urology.  This patient's health care is complicated by the following social determinants of health-lack of access to primary care.  Risk Prescription drug management.  Final diagnoses:  Urinary tract infection associated with indwelling urethral catheter, subsequent encounter    ED Discharge Orders          Ordered    ciprofloxacin  (CIPRO ) 500 MG tablet  Every 12 hours        04/02/24 2023    lidocaine  (XYLOCAINE ) 2 % solution  As needed        04/02/24 2026               Mannie Pac T, DO 04/02/24 2026

## 2024-04-02 NOTE — Discharge Instructions (Signed)
 I would like you to stop taking the previous antibiotics you are on.  These antibiotics were cefpodoxime  and linezolid .  Instead, you will begin taking ciprofloxacin , 500 mg 2 times per day for the next 1 week.  You may continue taking oxybutynin  to help with bladder spasms.  Please follow-up with your urologist within 1 to 2 weeks.  Return to the emergency room for fever or if you are no longer putting out urine from your Foley catheter.  You can also apply lidocaine  jelly to your penis for pain relief.

## 2024-04-02 NOTE — ED Notes (Signed)
 [facility] called, pt is 10th on the list

## 2024-04-02 NOTE — ED Triage Notes (Signed)
 Pt comes via [facility] from home, recently discharged from here with super pubic cath and thinks it may be displaced. Cath is draining but it burning and leaking around it.

## 2024-04-03 ENCOUNTER — Other Ambulatory Visit (HOSPITAL_COMMUNITY): Payer: Self-pay

## 2024-04-03 ENCOUNTER — Telehealth (HOSPITAL_BASED_OUTPATIENT_CLINIC_OR_DEPARTMENT_OTHER): Payer: Self-pay

## 2024-04-03 ENCOUNTER — Encounter (HOSPITAL_COMMUNITY): Admission: RE | Admit: 2024-04-03 | Source: Ambulatory Visit

## 2024-04-03 DIAGNOSIS — R5383 Other fatigue: Secondary | ICD-10-CM | POA: Diagnosis not present

## 2024-04-03 DIAGNOSIS — Z743 Need for continuous supervision: Secondary | ICD-10-CM | POA: Diagnosis not present

## 2024-04-03 NOTE — Telephone Encounter (Signed)
 Post ED Visit - Positive Culture Follow-up  Culture report reviewed by antimicrobial stewardship pharmacist: [facility] [x]  Leonor Bash, [place].D. []  Venetia Gully, Pharm.D., BCPS AQ-ID []  Garrel Crews, Pharm.D., BCPS []  Almarie Lunger, Pharm.D., BCPS []  [place], [address].D., BCPS, AAHIVP []  Rosaline Bihari, Pharm.D., BCPS, AAHIVP []  Vernell Meier, PharmD, BCPS []  Latanya Hint, PharmD, BCPS []  Donald Medley, PharmD, BCPS []  Rocky Bold, PharmD []  Dorothyann Alert, PharmD, BCPS []  Morene Babe, PharmD  [facility] []  Rosaline Edison, PharmD []  Romona Bliss, PharmD []  Dolphus Roller, PharmD []  Veva Seip, Rph []  Vernell Daunt) Leonce, PharmD []  Eva Allis, PharmD []  Rosaline Millet, PharmD []  Iantha Batch, PharmD []  Arvin Gauss, PharmD []  Wanda Hasting, PharmD []  Ronal Rav, PharmD []  Rocky Slade, PharmD []  Bard Jeans, PharmD   Positive urine culture Treated with Cipro , organism sensitive to the same. Presents with catheter problem, represented on 7/17 and abx switched to Cipro . no further patient follow-up is required at this time.  Micheal Moore 04/03/2024, 12:54 PM

## 2024-04-08 ENCOUNTER — Encounter: Payer: Self-pay | Admitting: Internal Medicine

## 2024-04-08 DIAGNOSIS — E785 Hyperlipidemia, unspecified: Secondary | ICD-10-CM | POA: Diagnosis not present

## 2024-04-08 DIAGNOSIS — L89152 Pressure ulcer of sacral region, stage 2: Secondary | ICD-10-CM | POA: Diagnosis not present

## 2024-04-08 DIAGNOSIS — I5032 Chronic diastolic (congestive) heart failure: Secondary | ICD-10-CM | POA: Diagnosis not present

## 2024-04-08 DIAGNOSIS — E1169 Type 2 diabetes mellitus with other specified complication: Secondary | ICD-10-CM | POA: Diagnosis not present

## 2024-04-08 DIAGNOSIS — R339 Retention of urine, unspecified: Secondary | ICD-10-CM | POA: Diagnosis not present

## 2024-04-08 DIAGNOSIS — Z89512 Acquired absence of left leg below knee: Secondary | ICD-10-CM | POA: Diagnosis not present

## 2024-04-09 ENCOUNTER — Inpatient Hospital Stay: Payer: Medicare Other

## 2024-04-10 ENCOUNTER — Ambulatory Visit (HOSPITAL_COMMUNITY): Admission: RE | Admit: 2024-04-10 | Source: Ambulatory Visit

## 2024-04-24 DIAGNOSIS — E1169 Type 2 diabetes mellitus with other specified complication: Secondary | ICD-10-CM | POA: Diagnosis not present

## 2024-04-24 DIAGNOSIS — E785 Hyperlipidemia, unspecified: Secondary | ICD-10-CM | POA: Diagnosis not present

## 2024-04-28 NOTE — Progress Notes (Deleted)
 [facility] OFFICE PROGRESS NOTE  Patient Care Team: [facility] as PCP - General Harden Jerona GAILS, MD as Consulting Physician (Orthopedic Surgery) Nichole Senior, MD (Endocrinology)  Assessment & Plan   No orders of the defined types were placed in this encounter.    Pauletta JAYSON [place], MD  INTERVAL HISTORY: Patient returns for follow-up.  Oncology History  Cutaneous melanoma (HCC)  10/10/2023 Initial Diagnosis   Cutaneous melanoma (HCC)   10/10/2023 Cancer Staging   Staging form: Melanoma of the Skin, AJCC 8th Edition - Clinical: Stage IIB (cT4a, cN0, cM0) - Signed by [place] Pauletta JAYSON, MD on 10/10/2023      PHYSICAL EXAMINATION: ECOG PERFORMANCE STATUS: {CHL ONC ECOG ED:[id number]}  There were no vitals filed for this visit. There were no vitals filed for this visit.  GENERAL: alert, no distress and comfortable SKIN: skin color normal and no bruising or petechiae or jaundice on exposed skin EYES: normal, sclera clear OROPHARYNX: no exudate  NECK: No palpable mass LYMPH:  no palpable cervical, axillary lymphadenopathy  LUNGS: clear to auscultation and percussion with normal breathing effort HEART: regular rate & rhythm  ABDOMEN: abdomen soft, non-tender and nondistended. Musculoskeletal: no edema NEURO: no focal motor/sensory deficits  Relevant data reviewed during this visit included ***

## 2024-04-29 DIAGNOSIS — Z1159 Encounter for screening for other viral diseases: Secondary | ICD-10-CM | POA: Diagnosis not present

## 2024-04-29 DIAGNOSIS — Z1321 Encounter for screening for nutritional disorder: Secondary | ICD-10-CM | POA: Diagnosis not present

## 2024-04-29 DIAGNOSIS — E559 Vitamin D deficiency, unspecified: Secondary | ICD-10-CM | POA: Diagnosis not present

## 2024-04-29 DIAGNOSIS — Z79899 Other long term (current) drug therapy: Secondary | ICD-10-CM | POA: Diagnosis not present

## 2024-04-30 ENCOUNTER — Ambulatory Visit

## 2024-04-30 ENCOUNTER — Inpatient Hospital Stay

## 2024-05-06 ENCOUNTER — Telehealth: Payer: Self-pay

## 2024-05-06 NOTE — Telephone Encounter (Signed)
 Left the patient a voicemail with the rescheduled appointment details.

## 2024-05-11 DIAGNOSIS — F0394 Unspecified dementia, unspecified severity, with anxiety: Secondary | ICD-10-CM | POA: Diagnosis not present

## 2024-05-11 DIAGNOSIS — I110 Hypertensive heart disease with heart failure: Secondary | ICD-10-CM | POA: Diagnosis not present

## 2024-05-11 DIAGNOSIS — I5032 Chronic diastolic (congestive) heart failure: Secondary | ICD-10-CM | POA: Diagnosis not present

## 2024-05-11 DIAGNOSIS — E1169 Type 2 diabetes mellitus with other specified complication: Secondary | ICD-10-CM | POA: Diagnosis not present

## 2024-05-11 DIAGNOSIS — J439 Emphysema, unspecified: Secondary | ICD-10-CM | POA: Diagnosis not present

## 2024-05-11 DIAGNOSIS — E785 Hyperlipidemia, unspecified: Secondary | ICD-10-CM | POA: Diagnosis not present

## 2024-05-11 DIAGNOSIS — F419 Anxiety disorder, unspecified: Secondary | ICD-10-CM | POA: Diagnosis not present

## 2024-05-11 DIAGNOSIS — E1151 Type 2 diabetes mellitus with diabetic peripheral angiopathy without gangrene: Secondary | ICD-10-CM | POA: Diagnosis not present

## 2024-05-11 DIAGNOSIS — L89152 Pressure ulcer of sacral region, stage 2: Secondary | ICD-10-CM | POA: Diagnosis not present

## 2024-05-12 ENCOUNTER — Inpatient Hospital Stay

## 2024-05-15 DIAGNOSIS — E1169 Type 2 diabetes mellitus with other specified complication: Secondary | ICD-10-CM | POA: Diagnosis not present

## 2024-05-15 DIAGNOSIS — R339 Retention of urine, unspecified: Secondary | ICD-10-CM | POA: Diagnosis not present

## 2024-05-15 DIAGNOSIS — E1165 Type 2 diabetes mellitus with hyperglycemia: Secondary | ICD-10-CM | POA: Diagnosis not present

## 2024-05-15 DIAGNOSIS — L89152 Pressure ulcer of sacral region, stage 2: Secondary | ICD-10-CM | POA: Diagnosis not present

## 2024-05-15 DIAGNOSIS — R262 Difficulty in walking, not elsewhere classified: Secondary | ICD-10-CM | POA: Diagnosis not present

## 2024-05-15 DIAGNOSIS — F419 Anxiety disorder, unspecified: Secondary | ICD-10-CM | POA: Diagnosis not present

## 2024-05-15 DIAGNOSIS — E785 Hyperlipidemia, unspecified: Secondary | ICD-10-CM | POA: Diagnosis not present

## 2024-05-15 DIAGNOSIS — I5032 Chronic diastolic (congestive) heart failure: Secondary | ICD-10-CM | POA: Diagnosis not present

## 2024-05-21 DIAGNOSIS — L89152 Pressure ulcer of sacral region, stage 2: Secondary | ICD-10-CM | POA: Diagnosis not present

## 2024-05-21 DIAGNOSIS — F0394 Unspecified dementia, unspecified severity, with anxiety: Secondary | ICD-10-CM | POA: Diagnosis not present

## 2024-05-21 DIAGNOSIS — E1169 Type 2 diabetes mellitus with other specified complication: Secondary | ICD-10-CM | POA: Diagnosis not present

## 2024-05-21 DIAGNOSIS — F419 Anxiety disorder, unspecified: Secondary | ICD-10-CM | POA: Diagnosis not present

## 2024-05-21 DIAGNOSIS — I5032 Chronic diastolic (congestive) heart failure: Secondary | ICD-10-CM | POA: Diagnosis not present

## 2024-05-21 DIAGNOSIS — E785 Hyperlipidemia, unspecified: Secondary | ICD-10-CM | POA: Diagnosis not present

## 2024-05-21 DIAGNOSIS — J439 Emphysema, unspecified: Secondary | ICD-10-CM | POA: Diagnosis not present

## 2024-05-21 DIAGNOSIS — I110 Hypertensive heart disease with heart failure: Secondary | ICD-10-CM | POA: Diagnosis not present

## 2024-05-21 DIAGNOSIS — E1151 Type 2 diabetes mellitus with diabetic peripheral angiopathy without gangrene: Secondary | ICD-10-CM | POA: Diagnosis not present

## 2024-05-22 DIAGNOSIS — F0394 Unspecified dementia, unspecified severity, with anxiety: Secondary | ICD-10-CM | POA: Diagnosis not present

## 2024-05-22 DIAGNOSIS — I5032 Chronic diastolic (congestive) heart failure: Secondary | ICD-10-CM | POA: Diagnosis not present

## 2024-05-22 DIAGNOSIS — I110 Hypertensive heart disease with heart failure: Secondary | ICD-10-CM | POA: Diagnosis not present

## 2024-05-22 DIAGNOSIS — L89152 Pressure ulcer of sacral region, stage 2: Secondary | ICD-10-CM | POA: Diagnosis not present

## 2024-05-22 DIAGNOSIS — J439 Emphysema, unspecified: Secondary | ICD-10-CM | POA: Diagnosis not present

## 2024-05-22 DIAGNOSIS — E785 Hyperlipidemia, unspecified: Secondary | ICD-10-CM | POA: Diagnosis not present

## 2024-05-22 DIAGNOSIS — F419 Anxiety disorder, unspecified: Secondary | ICD-10-CM | POA: Diagnosis not present

## 2024-05-22 DIAGNOSIS — E1151 Type 2 diabetes mellitus with diabetic peripheral angiopathy without gangrene: Secondary | ICD-10-CM | POA: Diagnosis not present

## 2024-05-22 DIAGNOSIS — E1169 Type 2 diabetes mellitus with other specified complication: Secondary | ICD-10-CM | POA: Diagnosis not present

## 2024-05-27 ENCOUNTER — Other Ambulatory Visit (HOSPITAL_COMMUNITY): Payer: Self-pay

## 2024-05-27 ENCOUNTER — Emergency Department (HOSPITAL_COMMUNITY)

## 2024-05-27 ENCOUNTER — Other Ambulatory Visit: Payer: Self-pay

## 2024-05-27 ENCOUNTER — Observation Stay (HOSPITAL_COMMUNITY)
Admission: EM | Admit: 2024-05-27 | Discharge: 2024-05-28 | Disposition: A | Discharge status: Home Health | Attending: Internal Medicine | Admitting: Internal Medicine

## 2024-05-27 ENCOUNTER — Encounter (HOSPITAL_COMMUNITY): Payer: Self-pay

## 2024-05-27 ENCOUNTER — Telehealth (HOSPITAL_COMMUNITY): Payer: Self-pay | Admitting: Pharmacy Technician

## 2024-05-27 DIAGNOSIS — I2510 Atherosclerotic heart disease of native coronary artery without angina pectoris: Secondary | ICD-10-CM | POA: Insufficient documentation

## 2024-05-27 DIAGNOSIS — Z7982 Long term (current) use of aspirin: Secondary | ICD-10-CM | POA: Diagnosis not present

## 2024-05-27 DIAGNOSIS — R051 Acute cough: Secondary | ICD-10-CM | POA: Diagnosis not present

## 2024-05-27 DIAGNOSIS — Z89612 Acquired absence of left leg above knee: Secondary | ICD-10-CM | POA: Insufficient documentation

## 2024-05-27 DIAGNOSIS — Z955 Presence of coronary angioplasty implant and graft: Secondary | ICD-10-CM | POA: Insufficient documentation

## 2024-05-27 DIAGNOSIS — R059 Cough, unspecified: Secondary | ICD-10-CM | POA: Insufficient documentation

## 2024-05-27 DIAGNOSIS — I739 Peripheral vascular disease, unspecified: Secondary | ICD-10-CM | POA: Diagnosis not present

## 2024-05-27 DIAGNOSIS — R10819 Abdominal tenderness, unspecified site: Secondary | ICD-10-CM | POA: Diagnosis not present

## 2024-05-27 DIAGNOSIS — I252 Old myocardial infarction: Secondary | ICD-10-CM | POA: Diagnosis not present

## 2024-05-27 DIAGNOSIS — R112 Nausea with vomiting, unspecified: Secondary | ICD-10-CM | POA: Diagnosis present

## 2024-05-27 DIAGNOSIS — I2699 Other pulmonary embolism without acute cor pulmonale: Secondary | ICD-10-CM | POA: Diagnosis present

## 2024-05-27 DIAGNOSIS — Z86718 Personal history of other venous thrombosis and embolism: Secondary | ICD-10-CM | POA: Insufficient documentation

## 2024-05-27 DIAGNOSIS — Z96642 Presence of left artificial hip joint: Secondary | ICD-10-CM | POA: Diagnosis not present

## 2024-05-27 DIAGNOSIS — Z7985 Long-term (current) use of injectable non-insulin antidiabetic drugs: Secondary | ICD-10-CM

## 2024-05-27 DIAGNOSIS — Z9861 Coronary angioplasty status: Secondary | ICD-10-CM | POA: Insufficient documentation

## 2024-05-27 DIAGNOSIS — I509 Heart failure, unspecified: Secondary | ICD-10-CM | POA: Insufficient documentation

## 2024-05-27 DIAGNOSIS — Z794 Long term (current) use of insulin: Secondary | ICD-10-CM

## 2024-05-27 DIAGNOSIS — K209 Esophagitis, unspecified without bleeding: Secondary | ICD-10-CM | POA: Diagnosis not present

## 2024-05-27 DIAGNOSIS — K208 Other esophagitis without bleeding: Principal | ICD-10-CM | POA: Insufficient documentation

## 2024-05-27 DIAGNOSIS — I743 Embolism and thrombosis of arteries of the lower extremities: Secondary | ICD-10-CM

## 2024-05-27 DIAGNOSIS — I70209 Unspecified atherosclerosis of native arteries of extremities, unspecified extremity: Secondary | ICD-10-CM | POA: Insufficient documentation

## 2024-05-27 DIAGNOSIS — Z79899 Other long term (current) drug therapy: Secondary | ICD-10-CM | POA: Diagnosis not present

## 2024-05-27 DIAGNOSIS — E119 Type 2 diabetes mellitus without complications: Secondary | ICD-10-CM | POA: Diagnosis not present

## 2024-05-27 DIAGNOSIS — I110 Hypertensive heart disease with heart failure: Secondary | ICD-10-CM | POA: Insufficient documentation

## 2024-05-27 DIAGNOSIS — Z95820 Peripheral vascular angioplasty status with implants and grafts: Secondary | ICD-10-CM | POA: Diagnosis not present

## 2024-05-27 DIAGNOSIS — R079 Chest pain, unspecified: Secondary | ICD-10-CM | POA: Diagnosis present

## 2024-05-27 DIAGNOSIS — I2694 Multiple subsegmental pulmonary emboli without acute cor pulmonale: Secondary | ICD-10-CM | POA: Diagnosis not present

## 2024-05-27 LAB — CBC
HCT: 38.9 % — ABNORMAL LOW (ref 39.0–52.0)
Hemoglobin: 13 g/dL (ref 13.0–17.0)
MCH: 30.7 pg (ref 26.0–34.0)
MCHC: 33.4 g/dL (ref 30.0–36.0)
MCV: 92 fL (ref 80.0–100.0)
Platelets: 217 K/uL (ref 150–400)
RBC: 4.23 MIL/uL (ref 4.22–5.81)
RDW: 13.3 % (ref 11.5–15.5)
WBC: 10.6 K/uL — ABNORMAL HIGH (ref 4.0–10.5)
nRBC: 0 % (ref 0.0–0.2)

## 2024-05-27 LAB — URINALYSIS, ROUTINE W REFLEX MICROSCOPIC
Bilirubin Urine: NEGATIVE
Glucose, UA: 50 mg/dL — AB
Hgb urine dipstick: NEGATIVE
Ketones, ur: 5 mg/dL — AB
Leukocytes,Ua: NEGATIVE
Nitrite: NEGATIVE
Protein, ur: NEGATIVE mg/dL
Specific Gravity, Urine: >1.046 — ABNORMAL HIGH (ref 1.005–1.030)
pH: 7 (ref 5.0–8.0)

## 2024-05-27 LAB — I-STAT CG4 LACTIC ACID, ED: Lactic Acid, Venous: 1.1 mmol/L (ref 0.5–1.9)

## 2024-05-27 LAB — HEPATIC FUNCTION PANEL
ALT: 10 U/L (ref 0–44)
AST: 14 U/L — ABNORMAL LOW (ref 15–41)
Albumin: 3.2 g/dL — ABNORMAL LOW (ref 3.5–5.0)
Alkaline Phosphatase: 57 U/L (ref 38–126)
Bilirubin, Direct: 0.2 mg/dL (ref 0.0–0.2)
Indirect Bilirubin: 0.9 mg/dL (ref 0.3–0.9)
Total Bilirubin: 1.1 mg/dL (ref 0.0–1.2)
Total Protein: 6.2 g/dL — ABNORMAL LOW (ref 6.5–8.1)

## 2024-05-27 LAB — BASIC METABOLIC PANEL WITH GFR
Anion gap: 14 (ref 5–15)
BUN: 13 mg/dL (ref 8–23)
CO2: 25 mmol/L (ref 22–32)
Calcium: 8.9 mg/dL (ref 8.9–10.3)
Chloride: 103 mmol/L (ref 98–111)
Creatinine, Ser: 0.87 mg/dL (ref 0.61–1.24)
GFR, Estimated: >60 mL/min (ref >60)
Glucose, Bld: 201 mg/dL — ABNORMAL HIGH (ref 70–99)
Potassium: 3.9 mmol/L (ref 3.5–5.1)
Sodium: 142 mmol/L (ref 135–145)

## 2024-05-27 LAB — PROTIME-INR
INR: 1 (ref 0.8–1.2)
Prothrombin Time: 13.7 s (ref 11.4–15.2)

## 2024-05-27 LAB — GLUCOSE, CAPILLARY
Glucose-Capillary: 149 mg/dL — ABNORMAL HIGH (ref 70–99)
Glucose-Capillary: 169 mg/dL — ABNORMAL HIGH (ref 70–99)

## 2024-05-27 LAB — APTT: aPTT: 22 s — ABNORMAL LOW (ref 24–36)

## 2024-05-27 LAB — RESP PANEL BY RT-PCR (RSV, FLU A&B, COVID)  RVPGX2
Influenza A by PCR: NEGATIVE
Influenza B by PCR: NEGATIVE
Resp Syncytial Virus by PCR: NEGATIVE
SARS Coronavirus 2 by RT PCR: NEGATIVE

## 2024-05-27 LAB — CBG MONITORING, ED: Glucose-Capillary: 196 mg/dL — ABNORMAL HIGH (ref 70–99)

## 2024-05-27 LAB — TROPONIN I (HIGH SENSITIVITY)
Troponin I (High Sensitivity): 17 ng/L (ref <18)
Troponin I (High Sensitivity): 11 ng/L (ref <18)

## 2024-05-27 LAB — MRSA NEXT GEN BY PCR, NASAL: MRSA by PCR Next Gen: DETECTED — AB

## 2024-05-27 LAB — LIPASE, BLOOD: Lipase: 11 U/L (ref 11–51)

## 2024-05-27 MED ORDER — ONDANSETRON 4 MG PO TBDP: 4.0000 mg | ORAL_TABLET | Freq: Four times a day (QID) | ORAL | Status: DC | PRN

## 2024-05-27 MED ORDER — FINASTERIDE 5 MG PO TABS
5.0000 mg | ORAL_TABLET | Freq: Every day | ORAL | Status: DC
  Administered 2024-05-27 – 2024-05-28 (×2): 5 mg via ORAL
  Filled 2024-05-27 (×2): qty 1

## 2024-05-27 MED ORDER — ROSUVASTATIN CALCIUM 5 MG PO TABS
5.0000 mg | ORAL_TABLET | Freq: Every day | ORAL | Status: DC
Start: 2024-05-27 — End: 2024-05-29
  Administered 2024-05-27 – 2024-05-28 (×2): 5 mg via ORAL
  Filled 2024-05-27 (×2): qty 1

## 2024-05-27 MED ORDER — INSULIN ASPART 100 UNIT/ML IJ SOLN
0.0000 [IU] | Freq: Three times a day (TID) | INTRAMUSCULAR | Status: DC
  Administered 2024-05-27: 1 [IU] via SUBCUTANEOUS
  Administered 2024-05-27: 2 [IU] via SUBCUTANEOUS
  Administered 2024-05-28: 5 [IU] via SUBCUTANEOUS

## 2024-05-27 MED ORDER — PANTOPRAZOLE SODIUM 40 MG IV SOLR
40.0000 mg | Freq: Once | INTRAVENOUS | Status: AC
  Administered 2024-05-27: 40 mg via INTRAVENOUS
  Filled 2024-05-27: qty 10

## 2024-05-27 MED ORDER — HEPARIN BOLUS VIA INFUSION
5000.0000 [IU] | Freq: Once | INTRAVENOUS | Status: AC
  Administered 2024-05-27: 5000 [IU] via INTRAVENOUS
  Filled 2024-05-27: qty 5000

## 2024-05-27 MED ORDER — ONDANSETRON HCL 4 MG/2ML IJ SOLN
4.0000 mg | Freq: Once | INTRAMUSCULAR | Status: AC
  Administered 2024-05-27: 4 mg via INTRAVENOUS
  Filled 2024-05-27: qty 2

## 2024-05-27 MED ORDER — SODIUM CHLORIDE 0.9 % IV BOLUS
500.0000 mL | Freq: Once | INTRAVENOUS | Status: AC
  Administered 2024-05-27: 500 mL via INTRAVENOUS

## 2024-05-27 MED ORDER — PANTOPRAZOLE SODIUM 40 MG PO TBEC: 40.0000 mg | DELAYED_RELEASE_TABLET | Freq: Every day | ORAL | Status: DC

## 2024-05-27 MED ORDER — POLYETHYLENE GLYCOL 3350 17 GM/SCOOP PO POWD: 17.0000 g | Freq: Every day | ORAL | Status: DC | PRN

## 2024-05-27 MED ORDER — SENNOSIDES-DOCUSATE SODIUM 8.6-50 MG PO TABS
2.0000 | ORAL_TABLET | Freq: Two times a day (BID) | ORAL | Status: DC
  Administered 2024-05-27 – 2024-05-28 (×3): 2 via ORAL
  Filled 2024-05-27 (×3): qty 2

## 2024-05-27 MED ORDER — POLYETHYLENE GLYCOL 3350 17 G PO PACK
17.0000 g | PACK | Freq: Two times a day (BID) | ORAL | Status: DC
  Administered 2024-05-27 – 2024-05-28 (×2): 17 g via ORAL
  Filled 2024-05-27 (×3): qty 1

## 2024-05-27 MED ORDER — HEPARIN (PORCINE) 25000 UT/250ML-% IV SOLN
1300.0000 [IU]/h | INTRAVENOUS | Status: DC
  Administered 2024-05-27: 1300 [IU]/h via INTRAVENOUS
  Filled 2024-05-27: qty 250

## 2024-05-27 MED ORDER — INSULIN ASPART 100 UNIT/ML IJ SOLN: 0.0000 [IU] | Freq: Every day | INTRAMUSCULAR | Status: DC

## 2024-05-27 MED ORDER — ALUM & MAG HYDROXIDE-SIMETH 200-200-20 MG/5ML PO SUSP
15.0000 mL | ORAL | Status: DC | PRN
  Administered 2024-05-28: 15 mL via ORAL
  Filled 2024-05-27: qty 30

## 2024-05-27 MED ORDER — PANTOPRAZOLE SODIUM 40 MG PO TBEC
40.0000 mg | DELAYED_RELEASE_TABLET | Freq: Two times a day (BID) | ORAL | Status: DC
  Administered 2024-05-27 – 2024-05-28 (×2): 40 mg via ORAL
  Filled 2024-05-27 (×2): qty 1

## 2024-05-27 MED ORDER — ASPIRIN 81 MG PO TBEC
81.0000 mg | DELAYED_RELEASE_TABLET | Freq: Every day | ORAL | Status: DC
Start: 2024-05-27 — End: 2024-05-29
  Administered 2024-05-27 – 2024-05-28 (×2): 81 mg via ORAL
  Filled 2024-05-27 (×2): qty 1

## 2024-05-27 MED ORDER — IOHEXOL 350 MG/ML SOLN
100.0000 mL | Freq: Once | INTRAVENOUS | Status: AC | PRN
  Administered 2024-05-27: 100 mL via INTRAVENOUS

## 2024-05-27 MED ORDER — METOPROLOL TARTRATE 25 MG PO TABS
25.0000 mg | ORAL_TABLET | Freq: Two times a day (BID) | ORAL | Status: DC
  Administered 2024-05-27 – 2024-05-28 (×2): 25 mg via ORAL
  Filled 2024-05-27 (×2): qty 1

## 2024-05-27 MED ORDER — APIXABAN 5 MG PO TABS
10.0000 mg | ORAL_TABLET | Freq: Two times a day (BID) | ORAL | Status: DC
  Administered 2024-05-27 – 2024-05-28 (×3): 10 mg via ORAL
  Filled 2024-05-27 (×3): qty 2

## 2024-05-27 MED ORDER — TAMSULOSIN HCL 0.4 MG PO CAPS
0.4000 mg | ORAL_CAPSULE | Freq: Every day | ORAL | Status: DC
  Administered 2024-05-27 – 2024-05-28 (×2): 0.4 mg via ORAL
  Filled 2024-05-27 (×2): qty 1

## 2024-05-27 MED ORDER — INSULIN GLARGINE-YFGN 100 UNIT/ML ~~LOC~~ SOLN: 20.0000 [IU] | Freq: Every day | SUBCUTANEOUS | Status: DC

## 2024-05-27 MED ORDER — APIXABAN 5 MG PO TABS: 5.0000 mg | ORAL_TABLET | Freq: Two times a day (BID) | ORAL | Status: DC

## 2024-05-27 MED ORDER — GUAIFENESIN ER 600 MG PO TB12
600.0000 mg | ORAL_TABLET | Freq: Two times a day (BID) | ORAL | Status: DC
  Administered 2024-05-27 – 2024-05-28 (×2): 600 mg via ORAL
  Filled 2024-05-27 (×2): qty 1

## 2024-05-27 MED ORDER — INSULIN GLARGINE 100 UNIT/ML ~~LOC~~ SOLN
20.0000 [IU] | Freq: Every day | SUBCUTANEOUS | Status: DC
  Administered 2024-05-27: 20 [IU] via SUBCUTANEOUS
  Filled 2024-05-27 (×2): qty 0.2

## 2024-05-27 NOTE — Plan of Care (Signed)

## 2024-05-27 NOTE — ED Triage Notes (Addendum)
 Patient BIB [facility] from home for chest pain. Patient states this started last night, pain was constant but eventually got to the point where he couldn't take it any longer. Center chest non radiating. N/V multiple times with EMS, patient states this is normal for him with movement. VSS w/ EMS. Pain currently 6/10. Patient received 324 aspirin , 1 nitroglycerin with no relief, 200 cc fluids, 4mg  zofran , and 6mg  morphine .

## 2024-05-27 NOTE — ED Notes (Signed)
 Patient oxygen saturation 88% while asleep, 2LNC applied while asleep.

## 2024-05-27 NOTE — Care Management (Addendum)
 Transition of Care [facility]) - Inpatient Brief Assessment   Patient Details  Name: Micheal Moore. MRN: [id number] Date of Birth: 1951/08/05  [facility] CM/SW Contact:    Corean JAYSON Canary, RN Phone Number: 05/27/2024, 12:41 PM   Clinical Narrative:  73 year old with recent history of BKA with Dr Harden, presents with nausea and vomiting. History of melanoma, not followed up with oncology, they saw him last admission and are trying to schedule follow up.  He is listed in [place] as active with [facility] for home health services, Arna from [facility] messaged to verify.  Messaged pharmacy to provide eligibility for DOAC. [address] back from [facility] he is active for nursing, aide and PT. Will need updated orders  IP care management will follow for needs ,recommendations, and transitions of care  Transition of Care Asessment: Insurance and Status: Insurance coverage has been reviewed Patient has primary care physician: No Home environment has been reviewed: Lives with spouse Prior level of function:: DME

## 2024-05-27 NOTE — Telephone Encounter (Signed)
 Patient Product/process development scientist completed.    The patient is insured through [employer]. Patient has Medicare and is not eligible for a copay card, but may be able to apply for patient assistance or Medicare RX Payment Plan (Patient Must reach out to their plan, if eligible for payment plan), if available.    Ran test claim for Eliquis 5 mg and the current 30 day co-pay is $47.00.  Ran test claim for Xarelto 20 mg and the current 30 day co-pay is $47.00.  This test claim was processed through [facility]- copay amounts may vary at other pharmacies due to pharmacy/plan contracts, or as the patient moves through the different stages of their insurance plan.     Roland Earl, CPHT Pharmacy Technician III Certified Patient Advocate [facility] Patient Advocate Team Direct Number: [phone]  Fax: [phone]

## 2024-05-27 NOTE — ED Provider Notes (Signed)
  EMERGENCY DEPARTMENT AT [facility] - Closed Provider Note   CSN: [id number] Arrival date & time: 05/27/24  9450     Patient presents with: Chest Pain   Micheal Moore. is a 73 y.o. male.   Patient with history of diabetes, DVT not currently on anticoagulation, MI s/p stent placement, osteomyelitis s/p left AKA, CHF, CAD presents today with complaints of chest pain, abdominal pain, nausea, and vomiting. Reports symptoms had been intermittent for the past 2 days, however last night symptoms became persistent. Called EMS for same, they gave him morphine , nitroglycerin, aspirin , zofran , and fluids which he reports gave him some improvement, however he is still nauseous. Pain is worse after eating and drinking, reports taking pepto at home with some improvement. Pain is in the middle of his chest, does not radiate. Reports he has not had a bowel movement in the past few days. Denies history of similar symptoms previously, does not feel like his previous MI. Also reports that he has been coughing more than usual. Denies fevers or chills, no sick contacts. No hematemesis.   The history is provided by the patient. No language interpreter was used.  Chest Pain Associated symptoms: abdominal pain, nausea and vomiting        Prior to Admission medications   Medication Sig Start Date End Date Taking? Authorizing Provider  ascorbic acid  (VITAMIN C ) 500 MG tablet Take 1 tablet (500 mg total) by mouth daily. 03/31/24   Franklyn Sid SAILOR, MD  aspirin  EC 81 MG tablet Take 1 tablet (81 mg total) by mouth daily. Swallow whole. 01/20/23   Christobal Guadalajara, MD  atorvastatin  (LIPITOR ) 10 MG tablet Take 10 mg by mouth 2 (two) times a week. Mondays and Thursday    [provider]  bisacodyl  (DULCOLAX) 5 MG EC tablet Take 2 tablets (10 mg total) by mouth at bedtime. 03/31/24   Franklyn Sid SAILOR, MD  cefpodoxime  (VANTIN ) 200 MG tablet Take 1 tablet (200 mg total) by mouth 2 (two) times daily.  03/31/24   Palumbo, April, MD  clotrimazole  (LOTRIMIN ) 1 % cream Apply topically 2 (two) times daily. 01/22/24   Gherghe, Costin M, MD  cyanocobalamin  1000 MCG tablet Take 1 tablet (1,000 mcg total) by mouth daily. 03/31/24   Franklyn Sid SAILOR, MD  Ensure Max Protein (ENSURE MAX PROTEIN) LIQD Take 330 mLs (11 oz total) by mouth 2 (two) times daily. 08/28/22   Arlice Reichert, MD  finasteride  (PROSCAR ) 5 MG tablet Take 1 tablet (5 mg total) by mouth daily. 03/31/24   Franklyn Sid SAILOR, MD  Glucose Blood (BLOOD GLUCOSE TEST STRIPS) STRP 1 each by Does not apply route 3 (three) times daily. Use as directed to check blood sugar. May dispense any manufacturer covered by patient's insurance and fits patient's device. 01/20/23   Christobal Guadalajara, MD  hydrocerin (EUCERIN) CREA Apply 1 Application topically 2 (two) times daily. 02/29/24   Tobie Yetta HERO, MD  hydrOXYzine  (ATARAX ) 25 MG tablet Take 1 tablet (25 mg total) by mouth every 8 (eight) hours as needed for itching. 03/01/24   Tobie Yetta HERO, MD  insulin  aspart (NOVOLOG ) 100 UNIT/ML FlexPen Inject 8 Units into the skin 3 (three) times daily with meals. Only take if eating a meal AND Blood Glucose (BG) is 80 or higher. 02/29/24 05/29/24  Tobie Yetta HERO, MD  insulin  glargine (LANTUS ) 100 UNIT/ML Solostar Pen Inject 20 Units into the skin 2 (two) times daily. 02/29/24   Tobie Yetta HERO, MD  Insulin  Pen Needle 32G X 4 MM MISC Use 3 (three) times daily with insulin  pen. 03/31/24   Franklyn Sid SAILOR, MD  iron  polysaccharides (NIFEREX) 150 MG capsule Take 1 capsule (150 mg total) by mouth daily. 03/01/24   Tobie Yetta HERO, MD  lidocaine  (XYLOCAINE ) 2 % solution Use as directed 15 mLs in the mouth or throat as needed for mouth pain. 04/02/24   Mannie Pac T, DO  linezolid  (ZYVOX ) 600 MG tablet Take 1 tablet (600 mg total) by mouth 2 (two) times daily. 03/31/24   Palumbo, April, MD  Nystatin  (GERHARDT'S BUTT CREAM) CREA Apply 1 Application topically 3 (three) times daily. 03/31/24    Franklyn Sid SAILOR, MD  nystatin  (MYCOSTATIN /NYSTOP ) powder Apply topically 3 (three) times daily. 03/31/24   Franklyn Sid SAILOR, MD  ondansetron  (ZOFRAN ) 4 MG tablet Take 4 mg by mouth every 8 (eight) hours as needed for nausea or vomiting. 11/13/23   [provider]  oxybutynin  (DITROPAN ) 5 MG tablet Take 1 tablet (5 mg total) by mouth every 8 (eight) hours as needed for bladder spasms. 03/31/24   Franklyn Sid SAILOR, MD  polyethylene glycol powder (GLYCOLAX /MIRALAX ) 17 GM/SCOOP powder Take 17 g by mouth daily as needed for mild constipation. 12/31/23   Elpidio Reyes DEL, MD  rosuvastatin  (CRESTOR ) 5 MG tablet Take 5 mg by mouth 2 (two) times a week. 11/13/23   [provider]  silver sulfADIAZINE (SILVADENE) 1 % cream Apply 1 Application topically. 02/26/24   [provider]  tamsulosin  (FLOMAX ) 0.4 MG CAPS capsule Take 1 capsule (0.4 mg total) by mouth daily after supper. 06/30/23   Amin, Ankit C, MD  Vitamin D , Ergocalciferol , (DRISDOL ) 1.25 MG (50000 UNIT) CAPS capsule Take 1 capsule (50,000 Units total) by mouth every 7 (seven) days. 03/31/24   Franklyn Sid SAILOR, MD    Allergies: Patient has no known allergies.    Review of Systems  Cardiovascular:  Positive for chest pain.  Gastrointestinal:  Positive for abdominal pain, nausea and vomiting.    Updated Vital Signs BP 135/82   Pulse 100   Temp 98.1 F (36.7 C) (Oral)   Resp 10   Ht 6' 5 (1.956 m)   Wt 77.1 kg   SpO2 100%   BMI 20.16 kg/m   Physical Exam Vitals and nursing note reviewed.  Constitutional:      General: He is not in acute distress.    Appearance: Normal appearance. He is normal weight. He is not ill-appearing, toxic-appearing or diaphoretic.  HENT:     Head: Normocephalic and atraumatic.  Cardiovascular:     Rate and Rhythm: Regular rhythm. Tachycardia present.     Heart sounds: Normal heart sounds.     Comments: Chronic vascular disease to RLE with palpable pulses. LLE s/p AKA, femoral pulses  detected with doppler Pulmonary:     Effort: Pulmonary effort is normal. No respiratory distress.     Breath sounds: Normal breath sounds.  Abdominal:     Tenderness: There is abdominal tenderness.  Musculoskeletal:        General: Normal range of motion.     Cervical back: Normal range of motion.     Right lower leg: No tenderness. No edema.  Skin:    General: Skin is warm and dry.  Neurological:     General: No focal deficit present.     Mental Status: He is alert.  Psychiatric:        Mood and Affect: Mood normal.  Behavior: Behavior normal.     (all labs ordered are listed, but only abnormal results are displayed) Labs Reviewed  BASIC METABOLIC PANEL WITH GFR - Abnormal; Notable for the following components:      Result Value   Glucose, Bld 201 (*)    All other components within normal limits  CBC - Abnormal; Notable for the following components:   WBC 10.6 (*)    HCT 38.9 (*)    All other components within normal limits  RESP PANEL BY RT-PCR (RSV, FLU A&B, COVID)  [id number]  TROPONIN I (HIGH SENSITIVITY)    EKG: EKG Interpretation Date/Time:  Wednesday May 27 2024 06:05:55 EDT Ventricular Rate:  103 PR Interval:  133 QRS Duration:  86 QT Interval:  344 QTC Calculation: 451 R Axis:   18  Text Interpretation: Sinus tachycardia Ventricular premature complex Aberrant complex Abnormal R-wave progression, early transition Borderline repolarization abnormality No significant change since last tracing Confirmed by Midge Golas ([id number]) on 05/27/2024 6:15:51 AM  Radiology: DG Chest Portable 1 View Result Date: 05/27/2024 EXAM: 1 VIEW XRAY OF THE CHEST 05/27/2024 06:32:00 AM COMPARISON: 02/25/2024 CLINICAL HISTORY: Chest pain. FINDINGS: LUNGS AND PLEURA: No focal pulmonary opacity. No pulmonary edema. No pleural effusion. No pneumothorax. HEART AND MEDIASTINUM: No acute abnormality of the cardiac and mediastinal silhouettes. BONES AND SOFT TISSUES: No acute  osseous abnormality. Surgical clips noted within the right axilla. EKG leads noted. IMPRESSION: 1. No acute process. Electronically signed by: Waddell Calk MD 05/27/2024 06:40 AM EDT RP Workstation: [id number]     .Critical Care  Performed by: Nora Lauraine LABOR, PA-C Authorized by: Srihaan Mastrangelo A, PA-C   Critical care provider statement:    Critical care time (minutes):  35   Critical care was necessary to treat or prevent imminent or life-threatening deterioration of the following conditions:  Circulatory failure   Critical care was time spent personally by me on the following activities:  Development of treatment plan with patient or surrogate, discussions with consultants, discussions with primary provider, evaluation of patient's response to treatment, examination of patient, obtaining history from patient or surrogate, ordering and review of laboratory studies, ordering and review of radiographic studies, pulse oximetry, re-evaluation of patient's condition and review of old charts   Care discussed with: admitting provider      Medications Ordered in the ED  ondansetron  (ZOFRAN ) injection 4 mg (has no administration in time range)  pantoprazole  (PROTONIX ) injection 40 mg (has no administration in time range)  sodium chloride  0.9 % bolus 500 mL (has no administration in time range)    Clinical Course as of 05/27/24 0832  Wed May 27, 2024  0829 Per Radiology: No dissection. There are a couple of small PE in the right middle lobe.   Abd: Left SSA.    [TL]    Clinical Course User Index [TL] Simon Lavonia SAILOR, MD                                 Medical Decision Making Amount and/or Complexity of Data Reviewed Labs: ordered. Radiology: ordered.  Risk Prescription drug management. Decision regarding hospitalization.   This patient is a 73 y.o. male who presents to the ED for concern of chest pain, abdominal pain, nausea, vomiting, this involves an extensive number of treatment  options, and is a complaint that carries with it a high risk of complications and morbidity. The emergent differential diagnosis prior to evaluation  includes, but is not limited to,  PUD, gastritis, pancreatitis, gastroparesis, malignancy, biliary disease, ACS/PE, AAA, pericarditis, pneumonia, intestinal ischemia, esophageal rupture, hepatitis, Boerhaave's, DKA, elevated ICP, Ischemic bowel, Sepsis, drug-related, Appendicitis, Bowel obstruction, Electrolyte abnormalities, Gastroenteritis, Gastroparesis, Hepatitis, Thyroid  disease, Renal colic, UTI  This is not an exhaustive differential.   Past Medical History / Co-morbidities / Social History:  has a past medical history of Cancer (HCC), Cellulitis in diabetic foot (HCC) (04/27/2020), Diabetes mellitus without complication (HCC), Diabetic ketoacidosis without coma associated with type 2 diabetes mellitus (HCC) (01/15/2023), Dizziness, DVT (deep venous thrombosis) (HCC), Elevated troponin (04/11/2023), Fall, Frequent falls, Hip fracture (HCC) (02/22/2022), Hypokalemia (04/11/2023), Near syncope, Osteomyelitis (HCC) (06/19/2023), Osteomyelitis of great toe of left foot (HCC) (04/27/2020), and Syncope.  Additional history: Chart reviewed. Pertinent results include: hx MI s/p stent placement in 2024, not currently on anticoagulation  Physical Exam: Physical exam performed. The pertinent findings include: generally unwell appearing, generalized abdominal TTP.  Lab Tests: I ordered, and personally interpreted labs.  The pertinent results include:  WBC 10.6, glucose 201. Troponin WNL   Imaging Studies: I ordered imaging studies including CXR, CTA chest abdomen pelvis. I independently visualized and interpreted imaging which showed   1. No aortic aneurysm, intramural hematoma, or aortic dissection. 2. Small burden segmental and subsegmental pulmonary emboli in the right middle lobe. No findings of right heart strain. 3. Complete occlusion of the  proximal left superficial femoral artery, worrisome for acute thrombus. 4. Findings consistent with an infectious or inflammatory esophagitis. Nonemergent upper endoscopy may be of benefit for further characterization. 5. Subpleural reticulation in the dependent aspects of both lower lobes. There may be some bronchiolectasis or early honeycombing on the right. While this is not significantly changed, it could reflect an underlying interstitial lung disease.  I agree with the radiologist interpretation.   Cardiac Monitoring:  The patient was maintained on a cardiac monitor. Cardiac monitor showed an underlying rhythm of: sinus tachycardia. I agree with this interpretation.   Medications: I ordered medication including heparin , zofran , protonix , fluids  for PE, esophagitis, nausea/vomiting, dehydration. Reevaluation of the patient after these medicines showed that the patient improved. I have reviewed the patients home medicines and have made adjustments as needed.  Consultations Obtained: I requested consultation with the vascular surgery on call Dr. Gretta,  and discussed lab and imaging findings as well as pertinent plan - they recommend: anticoagulation, no intervention indicated given patient is s/p left AKA   Disposition: After consideration of the diagnostic results and the patients response to treatment, I feel that patient will require admission for PE, nausea/vomiting. Patient is maintaining on room air when awake, however desats in the 80s when sleeping upright, placed on 2L O2 via nasal cannula, will require admission. Discussed same with patient who is understanding and in agreement  Discussed patient with hospitalist who accepts patient for admission.  I discussed this case with my attending physician Dr. Simon who cosigned this note including patient's presenting symptoms, physical exam, and planned diagnostics and interventions. Attending physician stated agreement with plan or made  changes to plan which were implemented.    Final diagnoses:  Multiple subsegmental pulmonary emboli without acute cor pulmonale (HCC)  Superficial femoral artery occlusion (HCC)  Esophagitis  Nausea and vomiting, unspecified vomiting type    ED Discharge Orders     None          Nora Lauraine DELENA DEVONNA 05/27/24 0934    Simon Lavonia SAILOR, MD 05/27/24 [phone]

## 2024-05-27 NOTE — Progress Notes (Signed)
 PHARMACY - ANTICOAGULATION CONSULT NOTE  Pharmacy Consult for DOAC use and discontinuation of continuous infusion of unfractionated heparin .  Indication: pulmonary embolus  No Known Allergies  Patient Measurements: Height: 6' 5 (195.6 cm) Weight: 77.1 kg (170 lb) IBW/kg (Calculated) : 89.1 HEPARIN  DW (KG): 77.1  Vital Signs: Temp: 98.1 F (36.7 C) (09/10 1017) Temp Source: Oral (09/10 1017) BP: 173/85 (09/10 0800) Pulse Rate: 104 (09/10 0800)  Labs: Recent Labs    05/27/24 0612 05/27/24 0828 05/27/24 0850  HGB 13.0  --   --   HCT 38.9*  --   --   PLT 217  --   --   LABPROT  --   --  13.7  INR  --   --  1.0  CREATININE 0.87  --   --   TROPONINIHS 17 11  --     Estimated Creatinine Clearance: 82.5 mL/min (by C-G formula based on SCr of 0.87 mg/dL).   Medical History: Past Medical History:  Diagnosis Date   Cancer [facility])    right elbow melanoma   Cellulitis in diabetic foot (HCC) 04/27/2020   Diabetes mellitus without complication (HCC)    Type II   Diabetic ketoacidosis without coma associated with type 2 diabetes mellitus (HCC) 01/15/2023   Dizziness    DVT (deep venous thrombosis) (HCC)    right leg   Elevated troponin 04/11/2023   Fall    Frequent falls    Hip fracture (HCC) 02/22/2022   Hypokalemia 04/11/2023   Near syncope    Osteomyelitis (HCC) 06/19/2023   Osteomyelitis of great toe of left foot (HCC) 04/27/2020   Syncope     Medications:  Scheduled:   apixaban   10 mg Oral BID   Followed by   NOREEN ON 06/03/2024] apixaban   5 mg Oral BID   insulin  aspart  0-5 Units Subcutaneous QHS   insulin  aspart  0-9 Units Subcutaneous TID WC   pantoprazole   40 mg Oral BID    Assessment: Patient is having continuous infusion of unfractionated heparin  discontinued and switch to oral PE dosing of apixaban  for this FDA approved indication     Plan:  Apixaban , 10 mg PO BID x 7 days; then beginning day 8, dose is decreased to 5 mg PO BID.   Lynwood KATHEE Lites, PharmD, CPP Clinical Pharmacist Practitioner 05/27/2024,11:40 AM

## 2024-05-27 NOTE — Hospital Course (Signed)
 Intermittent vomiting since the weekend Random, has been able to keep some stuff down 6-7 episodes here  Chest pain, feels like reflux,  No dyspnea 5 days between bowel movements, last BM sat/sund Liquid bms  Lives at home, wife, son, dogs, care a few times week  No new pain in left leg or other leg   Right ankle hard to move, prps foot up on rail/illows   Smoked up to this year, 1/2 ppd, 40ish years Last drink 84 No drug  Can transfer to chair with help and sliding board

## 2024-05-27 NOTE — H&P (Signed)
 Date: 05/27/2024               Patient Name:  Micheal Moore. 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 Rosan, Dayton BROCKS, DO      First Contact: Dr. Rebecka Pion, DO    Second Contact: Dr. Saad Nooruddin, MD         After Hours (After 5p/  First Contact Pager: [phone]  weekends / holidays): Second Contact Pager: [phone]   SUBJECTIVE   Chief Complaint: nausea and vomiting  History of Present Illness:  Micheal Angert. is a 73 year old male with a past medical history of CAD s/p PCI, T2DM, BPH with chronic Foley catheter use, peripheral vascular disease s/p left AKA, and prior tobacco use disorder who presents with 4 days of persistent nausea and vomiting with indigestion/reflux pain.  Symptoms started on Saturday without any known trigger.  He denies any change in diet or medications recently.  He has intermittent nausea and vomiting with no hematemesis or coffee-ground emesis but has been able to keep down food and drink for the past few days.  About 2 AM this morning he had some severe nausea and vomiting and has vomited about 6 times since being in the emergency department but his nausea is much improved now.  He continues to have some mild reflux-like pain but denies any abdominal pain, change in bowel habits, new lower extremity pain, dyspnea, fevers, or presyncope.  ED Course: Presented as above mildly tachycardic but otherwise hemodynamically stable.  He was put on 2 L of supplemental oxygen through nasal cannula but was quickly weaned to room air with good oxygen saturations.  CMP without significant abnormality, CBC showed leukocytosis of 10.6 with no anemia or thrombocytopenia, INR 1.0, and troponins flat and low at 17 then 11.  CT angio of the chest abdomen and pelvis showed small burden of segmental and subsegmental  pulmonary emboli in the right middle lobe without right heart strain, complete occlusion of the proximal left superficial femoral artery, infectious versus inflammatory esophagitis, and some subpleural reticulation in the lower lungs.  Vascular surgery was consulted who did not recommend intervention for his superficial femoral artery thrombus and he was started on heparin .  IMTS was paged for admission.  Past Medical History CAD s/p PCI T2DM BPH with chronic Foley catheter use Peripheral vascular disease s/p AKA Prior tobacco use disorder Melanoma  Meds:  Unable to confirm medications with the patient.  Patient's wife states that no medications have been changed since his last discharge from [facility].  List below is prepopulated and the patient's wife will bring in a medication list as well as medication bottles when she comes later today. Current Outpatient Medications  Medication Instructions   ascorbic acid  (VITAMIN C ) 500 mg, Oral, Daily   aspirin  EC 81 mg, Oral, Daily, Swallow whole.   atorvastatin  (LIPITOR ) 10 mg, 2 times weekly   bisacodyl  10 mg, Oral, Daily at bedtime   cefpodoxime  (VANTIN ) 200 mg, Oral, 2 times daily   clotrimazole  (LOTRIMIN ) 1 % cream Topical, 2 times daily   cyanocobalamin  (VITAMIN B12) 1,000 mcg, Oral, Daily   Ensure Max Protein (ENSURE MAX PROTEIN) LIQD 11 oz, Oral, 2 times daily   Ferrex 150 150 mg, Oral, Daily   finasteride  (PROSCAR ) 5  mg, Oral, Daily   Glucose Blood (BLOOD GLUCOSE TEST STRIPS) STRP 1 each, Does not apply, 3 times daily, Use as directed to check blood sugar. May dispense any manufacturer covered by patient's insurance and fits patient's device.   hydrocerin (EUCERIN) CREA 1 Application, Topical, 2 times daily   hydrOXYzine  (ATARAX ) 25 mg, Oral, Every 8 hours PRN   Insulin  Pen Needle 32G X 4 MM MISC Use 3 (three) times daily with insulin  pen.   Lantus  SoloStar 20 Units, Subcutaneous, 2 times daily   lidocaine  (XYLOCAINE ) 2 %  solution 15 mLs, Mouth/Throat, As needed   linezolid  (ZYVOX ) 600 mg, Oral, 2 times daily   NovoLOG  FlexPen 8 Units, Subcutaneous, 3 times daily with meals, Only take if eating a meal AND Blood Glucose (BG) is 80 or higher.   Nystatin  (GERHARDT'S BUTT CREAM) CREA 1 Application, Topical, 3 times daily   nystatin  (MYCOSTATIN /NYSTOP ) powder Topical, 3 times daily   ondansetron  (ZOFRAN ) 4 mg, Oral, Every 8 hours PRN   oxybutynin  (DITROPAN ) 5 mg, Oral, Every 8 hours PRN   polyethylene glycol powder (GLYCOLAX /MIRALAX ) 17 g, Oral, Daily PRN   rosuvastatin  (CRESTOR ) 5 mg, 2 times weekly   silver sulfADIAZINE (SILVADENE) 1 % cream 1 Application   tamsulosin  (FLOMAX ) 0.4 mg, Oral, Daily after supper   Vitamin D  (Ergocalciferol ) (DRISDOL ) 50,000 Units, Oral, Every 7 days     Past Surgical History Past Surgical History:  Procedure Laterality Date   AMPUTATION Left 06/28/2023   Procedure: LEFT ABOVE KNEE AMPUTATION;  Surgeon: Harden Jerona GAILS, MD;  Location: [facility] OR;  Service: Orthopedics;  Laterality: Left;   AMPUTATION TOE Left 04/28/2020   Procedure: AMPUTATION LEFT GREAT TOE;  Surgeon: Harden Jerona GAILS, MD;  Location: [facility] OR;  Service: Orthopedics;  Laterality: Left;   CORONARY/GRAFT ACUTE MI REVASCULARIZATION N/A 01/15/2023   Procedure: Coronary/Graft Acute MI Revascularization;  Surgeon: Wendel Lurena POUR, MD;  Location: [facility] INVASIVE CV LAB;  Service: Cardiovascular;  Laterality: N/A;   INTRAMEDULLARY (IM) NAIL INTERTROCHANTERIC Right 02/23/2022   Procedure: INTRAMEDULLARY (IM) NAIL INTERTROCHANTRIC, RIGHT;  Surgeon: Fidel Rogue, MD;  Location: [facility] ORS;  Service: Orthopedics;  Laterality: Right;   LEFT HEART CATH AND CORONARY ANGIOGRAPHY N/A 01/15/2023   Procedure: LEFT HEART CATH AND CORONARY ANGIOGRAPHY;  Surgeon: Wendel Lurena POUR, MD;  Location: [facility] INVASIVE CV LAB;  Service: Cardiovascular;  Laterality: N/A;   LOWER EXTREMITY ANGIOGRAPHY Bilateral 01/18/2023   Procedure: Lower Extremity Angiography;   Surgeon: Serene Gaile ORN, MD;  Location: [facility] INVASIVE CV LAB;  Service: Cardiovascular;  Laterality: Bilateral;   MELANOMA EXCISION WITH SENTINEL LYMPH NODE BIOPSY Right 08/19/2020   Procedure: WIDE LOCAL EXCISION RIGHT ELBOW MELANOMA WITH RIGHT AXILLARY SENTINEL LYMPH NODE BIOPSY;  Surgeon: Dasie Leonor CROME, MD;  Location: [facility] OR;  Service: General;  Laterality: Right;  2nd incision in axilla   TEE WITHOUT CARDIOVERSION N/A 04/19/2023   Procedure: TRANSESOPHAGEAL ECHOCARDIOGRAM;  Surgeon: Kate Lonni CROME, MD;  Location: [facility] INVASIVE CV LAB;  Service: Cardiovascular;  Laterality: N/A;   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   Procedure: TOTAL HIP ARTHROPLASTY ANTERIOR APPROACH;  Surgeon: Fidel Rogue, MD;  Location: [facility] ORS;  Service: Orthopedics;  Laterality: Left;   TOTAL HIP ARTHROPLASTY Left 08/24/2022   Procedure: OPEN REDUCTION, HEAD AND LINER EXCHANGE;  Surgeon: Fidel Rogue, MD;  Location: [facility] ORS;  Service: Orthopedics;  Laterality: Left;    Social:  Lives at home with wife, son, and 3 dogs.  Independent in ADLs and IADLs.  Predominantly bedbound  at baseline but can transfer from bed to chair using a sliding board.  Has home health aides/nurses come 2-3 times per week. PCP: Darice Ka NP Substances: Prior 1/2 pack/day tobacco use 4 roughly 40 years, quit early 2025.  Denies alcohol  or drug use.  Family History:  Family History  Problem Relation Age of Onset   Stroke Mother    Diabetes Father    Stroke Sister    Diabetes Sister     Allergies: Allergies as of 05/27/2024   (No Known Allergies)    Review of Systems: A complete ROS was negative except as per HPI.   OBJECTIVE:   Physical Exam: Blood pressure (!) 173/85, pulse (!) 104, temperature 98.1 F (36.7 C), temperature source Oral, resp. rate 10, height 6' 5 (1.956 m), weight 77.1 kg, SpO2 97%.  Constitutional: Chronically ill-appearing elderly male laying in bed. In no acute distress. HENT:  Normocephalic, atraumatic,  Eyes: Sclera non-icteric, PERRL, EOM intact Cardio:Regular rate and rhythm. 2+ bilateral radial pulses.  Nonpalpable right dorsalis pedis pulse.  Right lower extremity warm and well-perfused.  Left AKA warm and well-perfused. Pulm:Clear to auscultation bilaterally in the anterior fields. Normal work of breathing on room air. Abdomen: Soft, non-tender, non-distended, positive bowel sounds. MSK: Stable left AKA.  No lower extremity edema.  Right lower extremity warm and well-perfused with 2 small ulcerations on the dorsum of the distal first metatarsal and dorsal phalangeal joint without drainage or any signs of infection Skin:Warm and dry. Neuro:Alert and oriented x3. No focal deficit noted.  Labs: CBC    Component Value Date/Time   WBC 10.6 (H) 05/27/2024 0612   RBC 4.23 05/27/2024 0612   HGB 13.0 05/27/2024 0612   HGB 12.9 (L) 10/10/2023 1447   HGB 13.1 12/04/2021 1429   HCT 38.9 (L) 05/27/2024 0612   HCT 37.5 12/04/2021 1429   PLT 217 05/27/2024 0612   PLT 252 10/10/2023 1447   PLT 225 12/04/2021 1429   MCV 92.0 05/27/2024 0612   MCV 95 12/04/2021 1429   MCH 30.7 05/27/2024 0612   MCHC 33.4 05/27/2024 0612   RDW 13.3 05/27/2024 0612   RDW 12.3 12/04/2021 1429   LYMPHSABS 1.8 03/31/2024 2350   MONOABS 0.4 03/31/2024 2350   EOSABS 0.3 03/31/2024 2350   BASOSABS 0.1 03/31/2024 2350     CMP     Component Value Date/Time   NA 142 05/27/2024 0612   NA 137 12/04/2021 1429   K 3.9 05/27/2024 0612   CL 103 05/27/2024 0612   CO2 25 05/27/2024 0612   GLUCOSE 201 (H) 05/27/2024 0612   BUN 13 05/27/2024 0612   BUN 17 12/04/2021 1429   CREATININE 0.87 05/27/2024 0612   CREATININE 0.74 10/10/2023 1447   CALCIUM  8.9 05/27/2024 0612   PROT 6.2 (L) 05/27/2024 0828   ALBUMIN  3.2 (L) 05/27/2024 0828   AST 14 (L) 05/27/2024 0828   AST 11 (L) 10/10/2023 1447   ALT 10 05/27/2024 0828   ALT 9 10/10/2023 1447   ALKPHOS 57 05/27/2024 0828   BILITOT 1.1  05/27/2024 0828   BILITOT 0.8 10/10/2023 1447   GFRNONAA >60 05/27/2024 0612   GFRNONAA >60 10/10/2023 1447   GFRAA >60 04/29/2020 0620    Imaging: CT Angio Chest/Abd/Pel for Dissection W and/or Wo Contrast Result Date: 05/27/2024 CLINICAL DATA:  Acute aortic syndrome (AAS) suspected EXAM: CT ANGIOGRAPHY CHEST, ABDOMEN AND PELVIS TECHNIQUE: Non-contrast CT of the chest was initially obtained. Multidetector CT imaging through the chest, abdomen and  pelvis was performed using the standard protocol during bolus administration of intravenous contrast. Multiplanar reconstructed images and MIPs were obtained and reviewed to evaluate the vascular anatomy. RADIATION DOSE REDUCTION: This exam was performed according to the departmental dose-optimization program which includes automated exposure control, adjustment of the mA and/or kV according to patient size and/or use of iterative reconstruction technique. CONTRAST:  OMNIPAQUE  IOHEXOL  350 MG/ML SOLN COMPARISON:  01/14/2024, 10/01/2022 FINDINGS: CTA CHEST FINDINGS Pulmonary Embolism: Small burden segmental and distal segmental pulmonary emboli within the right upper and right middle lobes. No findings of right heart strain. Cardiovascular: No cardiomegaly or pericardial effusion. No aortic aneurysm, intramural hematoma, or aortic dissection. Mediastinum/Nodes: No mediastinal mass. No mediastinal, hilar, or axillary lymphadenopathy. Lungs/Pleura: The midline trachea and bronchi are patent. Subpleural reticulation in the dependent aspects of both lower lobes. There may be some bronchiolectasis or early honeycombing on the right. No focal airspace consolidation, pleural effusion, or pneumothorax. Review of the MIP images confirms the above findings. CTA ABDOMEN AND PELVIS FINDINGS VASCULAR Aorta: No aortic aneurysm or aortic dissection. Calcified and noncalcified aortic atherosclerosis. No hemodynamically significant stenosis. Celiac: Patent without acute  thrombus, aneurysm, or dissection. No hemodynamically significant stenosis. SMA: Patent without acute thrombus, aneurysm, or dissection. No hemodynamically significant stenosis. Renals: 2 patent renal arteries bilaterally without acute thrombus, aneurysm, or dissection. No hemodynamically significant stenosis. IMA: Patent without acute thrombus, aneurysm, or dissection. Moderate ostial stenosis from noncalcified plaque. Inflow: Patent without acute thrombus, aneurysm, or dissection. No hemodynamically significant stenosis. Proximal Outflow: Complete occlusion of the proximal left SFA, worrisome from acute thrombus. Veins: Nondiagnostic evaluation of the veins due to arterial timing of the contrast bolus. Review of the MIP images confirms the above findings. NON-VASCULAR Hepatobiliary: No mass.No radiopaque stones or wall thickening of the gallbladder. No intrahepatic or extrahepatic biliary ductal dilation. Pancreas: No mass or main ductal dilation.No peripancreatic inflammation or fluid collection. Spleen: Normal size. No mass. Adrenals/Urinary Tract: No adrenal masses. No renal mass. No hydronephrosis or nephrolithiasis. The decompressed urinary bladder is obscured by metallic streak artifact from orthopedic hardware. Stomach/Bowel: Moderate circumferential wall thickening of the mid and distal esophagus. Ingested material layering in the stomach without focal abnormality. No small bowel wall thickening or inflammation. No small bowel obstruction.Normal appendix. Lymphatic: No intraabdominal or pelvic lymphadenopathy. Reproductive: No prostatomegaly. No free pelvic fluid. Other: No pneumoperitoneum, ascites, or mesenteric inflammation. Musculoskeletal: No acute fracture or destructive lesion.Osteopenia. Remote bilateral rib fractures with bony fusion between couple of right-sided ribs. Multilevel degenerative disc disease of the spine. Diffuse osteopenia. Left hip arthroplasty is anatomically aligned without  dislocation. Partially visualized right femoral intramedullary nail. Heterotopic bone formation noted around both hips. On the left, there is partial fusion or pseudoarthrosis between the left iliac bone and left femur. Review of the MIP images confirms the above findings. IMPRESSION: 1. No aortic aneurysm, intramural hematoma, or aortic dissection. 2. Small burden segmental and subsegmental pulmonary emboli in the right middle lobe. No findings of right heart strain. 3. Complete occlusion of the proximal left superficial femoral artery, worrisome for acute thrombus. 4. Findings consistent with an infectious or inflammatory esophagitis. Nonemergent upper endoscopy may be of benefit for further characterization. 5. Subpleural reticulation in the dependent aspects of both lower lobes. There may be some bronchiolectasis or early honeycombing on the right. While this is not significantly changed, it could reflect an underlying interstitial lung disease. Critical Value/emergent results were called by telephone at the time of interpretation on 05/27/2024 at 8:29 am  to provider Lavonia Pat, MD, who verbally acknowledged these results. Aortic Atherosclerosis (ICD10-I70.0). Electronically Signed   By: Rogelia Myers M.D.   On: 05/27/2024 08:38   DG Chest Portable 1 View Result Date: 05/27/2024 EXAM: 1 VIEW XRAY OF THE CHEST 05/27/2024 06:32:00 AM COMPARISON: 02/25/2024 CLINICAL HISTORY: Chest pain. FINDINGS: LUNGS AND PLEURA: No focal pulmonary opacity. No pulmonary edema. No pleural effusion. No pneumothorax. HEART AND MEDIASTINUM: No acute abnormality of the cardiac and mediastinal silhouettes. BONES AND SOFT TISSUES: No acute osseous abnormality. Surgical clips noted within the right axilla. EKG leads noted. IMPRESSION: 1. No acute process. Electronically signed by: Waddell Calk MD 05/27/2024 06:40 AM EDT RP Workstation: [id number]     EKG: personally reviewed my interpretation is sinus tachycardia. Consistent with  prior EKG.  ASSESSMENT & PLAN:   Assessment & Plan by Problem: Principal Problem:   Acute esophagitis Active Problems:   Nausea with vomiting   Multiple subsegmental pulmonary emboli without acute cor pulmonale (HCC)   Superficial femoral artery occlusion (HCC)   Pulmonary embolism (HCC)   Micheal FORBES [name]. is a 73 y.o. male with pertinent PMH of CAD s/p PCI, T2DM, BPH with chronic Foley catheter use, peripheral vascular disease s/p left AKA, and prior tobacco use disorder who presented with 4 days of nausea vomiting and is admitted for pulmonary emboli and acute esophagitis.  Segmental and subsegmental pulmonary emboli, right middle lobe CTA showed a small burden of segmental and subsegmental pulmonary emboli in the right middle lobe without evidence of right heart strain.  Although he was initially put on oxygen he did not require this and was satting at a 100% on room air.  Troponins were normal at 17 at 11 and EKG did not show any changes from prior.  Patient is primarily bedbound at baseline although he can transfer to wheelchair with a slide board.  He also has significant peripheral vascular disease and documentation of melanoma with an upcoming whole-body PET scan.  For now we will treat this as provoked in the setting of decreased mobility and possible malignancy.  No atrial fibrillation on the monitor here.  Without any symptoms and no signs of right heart strain, no hypoxia, and no previous significant bleeding history he is appropriate to start an oral anticoagulant and return home with strict return precautions.  He will be started on Eliquis  10 mg twice daily for 7 days and then 5 mg twice daily after this.  Unfortunately we were unable to confirm home medications of which he needed a lot refilled so he will be admitted for overnight observation. - Eliquis  10 mg twice daily  Acute esophagitis Nausea vomiting Patient presented with 4 days of persistent nausea and vomiting  the family brought him to the hospital.  CT showed evidence of esophagitis that could be infectious or inflammatory.  He has no signs of systemic infection to warrant antibiotics.  Discussed with on-call gastroenterology team who agree that outpatient follow-up for EGD is warranted.  His nausea resolved with Zofran  which he has at home and his reflux pain improved with addition of a BID PPI along with as needed Maalox/Mylanta.  Labs on admission showed no significant electrolyte derangements or AKI. Gi follow up was scheduled 05/29/24 @ 8:30 with [facility]. Plan was to discharge from the ED however as above there were some issues with confirming his medications. - Zofran  4 mg every 8 hours as needed - Maalox/Mylanta every 4 hours as needed  Left superficial femoral artery  occlusion CTA also showed complete occlusion of the proximal left superficial femoral artery that was worrisome for an acute thrombus.  Patient had no symptoms due to this and his left leg was warm and well-perfused with a stable left AKA.  Vascular surgery was consulted in the ED and they did not recommend any intervention.  As above he will be on a blood thinner for pulmonary emboli.   Type 2 diabetes Most recent A1c on/30/2025 was 8.2.  He does appear to be on insulin  at home along with glimepiride .  Unfortunately we cannot confirm any of his medications and could not get in contact with his primary care who is not [facility].  His wife will bring  his medications along with his most recent medication list later today for us  to review. - Sensitive SSI 3 times daily with meals and at night  CAD Peripheral vascular disease Based on pharmacy fill history he appears to be on aspirin  monotherapy and now will be on anticoagulant with his pulmonary emboli.  Was last seen by vascular surgery at [facility] in May 2024 however this was prior to his left AKA in 06/2023.  As above we will get his medication list and resume home medications which are  likely aspirin  and a statin.  No signs of critical limb ischemia at this time.  Diet: Normal VTE: Anticoagulated with DOAC Code: Full  Dispo: Admit patient to Observation with expected length of stay less than 2 midnights.  Signed: Fairy Pool, DO Internal Medicine Resident, PGY-3 Please contact the on call pager at [phone] for any urgent or emergent needs. 11:52 AM 05/27/2024

## 2024-05-27 NOTE — Progress Notes (Signed)
 PHARMACY - ANTICOAGULATION CONSULT NOTE  Pharmacy Consult for heparin  Indication: pulmonary embolus  No Known Allergies  Patient Measurements: Height: 6' 5 (195.6 cm) Weight: 77.1 kg (170 lb) IBW/kg (Calculated) : 89.1 HEPARIN  DW (KG): 77.1  Vital Signs: Temp: 98.1 F (36.7 C) (09/10 0601) Temp Source: Oral (09/10 0601) BP: 173/85 (09/10 0800) Pulse Rate: 104 (09/10 0800)  Labs: Recent Labs    05/27/24 0612  HGB 13.0  HCT 38.9*  PLT 217  CREATININE 0.87  TROPONINIHS 17    Estimated Creatinine Clearance: 82.5 mL/min (by C-G formula based on SCr of 0.87 mg/dL).   Medical History: Past Medical History:  Diagnosis Date   Cancer [facility])    right elbow melanoma   Cellulitis in diabetic foot (HCC) 04/27/2020   Diabetes mellitus without complication (HCC)    Type II   Diabetic ketoacidosis without coma associated with type 2 diabetes mellitus (HCC) 01/15/2023   Dizziness    DVT (deep venous thrombosis) (HCC)    right leg   Elevated troponin 04/11/2023   Fall    Frequent falls    Hip fracture (HCC) 02/22/2022   Hypokalemia 04/11/2023   Near syncope    Osteomyelitis (HCC) 06/19/2023   Osteomyelitis of great toe of left foot (HCC) 04/27/2020   Syncope       Assessment: 73 yoM found to have new PE. No oral anticoagulation reported prior to admission. CBC WBL. Pharmacy consulted to manage heparin  infusion.    Goal of Therapy:  Heparin  level 0.3-0.7 units/ml Monitor platelets by anticoagulation protocol: Yes   Plan:  Give 5000 units bolus x 1 Start heparin  infusion at 1300 units/hr Check anti-Xa level in 8 hours and daily while on heparin  Continue to monitor H&H and platelets  Koren Or, PharmD Clinical Pharmacist 05/27/2024 8:56 AM Please check AMION for all [facility] numbers

## 2024-05-27 NOTE — Progress Notes (Signed)
 [facility] Liaison Note  This patient is currently enrolled in [facility] program.  Hospital Liaison will continue to follow for discharge disposition.  Please call for any home based primary care needs.  Thank you, Inocente Jacobs, BSN [facility] Liaison [phone]

## 2024-05-28 ENCOUNTER — Other Ambulatory Visit (HOSPITAL_COMMUNITY): Payer: Self-pay

## 2024-05-28 DIAGNOSIS — K209 Esophagitis, unspecified without bleeding: Secondary | ICD-10-CM | POA: Diagnosis not present

## 2024-05-28 DIAGNOSIS — I743 Embolism and thrombosis of arteries of the lower extremities: Secondary | ICD-10-CM | POA: Diagnosis not present

## 2024-05-28 DIAGNOSIS — E119 Type 2 diabetes mellitus without complications: Secondary | ICD-10-CM | POA: Diagnosis not present

## 2024-05-28 DIAGNOSIS — I2694 Multiple subsegmental pulmonary emboli without acute cor pulmonale: Secondary | ICD-10-CM | POA: Diagnosis not present

## 2024-05-28 LAB — CBC
HCT: 36 % — ABNORMAL LOW (ref 39.0–52.0)
Hemoglobin: 11.9 g/dL — ABNORMAL LOW (ref 13.0–17.0)
MCH: 30.8 pg (ref 26.0–34.0)
MCHC: 33.1 g/dL (ref 30.0–36.0)
MCV: 93.3 fL (ref 80.0–100.0)
Platelets: 206 K/uL (ref 150–400)
RBC: 3.86 MIL/uL — ABNORMAL LOW (ref 4.22–5.81)
RDW: 13.5 % (ref 11.5–15.5)
WBC: 8.4 K/uL (ref 4.0–10.5)
nRBC: 0 % (ref 0.0–0.2)

## 2024-05-28 LAB — BASIC METABOLIC PANEL WITH GFR
Anion gap: 11 (ref 5–15)
BUN: 11 mg/dL (ref 8–23)
CO2: 24 mmol/L (ref 22–32)
Calcium: 8.9 mg/dL (ref 8.9–10.3)
Chloride: 101 mmol/L (ref 98–111)
Creatinine, Ser: 0.82 mg/dL (ref 0.61–1.24)
GFR, Estimated: >60 mL/min (ref >60)
Glucose, Bld: 123 mg/dL — ABNORMAL HIGH (ref 70–99)
Potassium: 3.9 mmol/L (ref 3.5–5.1)
Sodium: 136 mmol/L (ref 135–145)

## 2024-05-28 LAB — GLUCOSE, CAPILLARY
Glucose-Capillary: 118 mg/dL — ABNORMAL HIGH (ref 70–99)
Glucose-Capillary: 272 mg/dL — ABNORMAL HIGH (ref 70–99)
Glucose-Capillary: 214 mg/dL — ABNORMAL HIGH (ref 70–99)

## 2024-05-28 MED ORDER — METOPROLOL TARTRATE 25 MG PO TABS
25.0000 mg | ORAL_TABLET | Freq: Two times a day (BID) | ORAL | 0 refills | Status: DC
  Filled 2024-05-28: qty 60, 30d supply, fill #0

## 2024-05-28 MED ORDER — INSULIN GLARGINE 100 UNIT/ML SOLOSTAR PEN
20.0000 [IU] | PEN_INJECTOR | Freq: Two times a day (BID) | SUBCUTANEOUS | 0 refills | Status: DC
  Filled 2024-05-28: qty 15, 38d supply, fill #0

## 2024-05-28 MED ORDER — HYDROCERIN EX CREA
1.0000 | TOPICAL_CREAM | Freq: Two times a day (BID) | CUTANEOUS | 0 refills | Status: AC
  Filled 2024-05-28: qty 113, 14d supply, fill #0

## 2024-05-28 MED ORDER — VITAMIN D (ERGOCALCIFEROL) 1.25 MG (50000 UNIT) PO CAPS
50000.0000 [IU] | ORAL_CAPSULE | ORAL | 0 refills | Status: AC
  Filled 2024-05-28: qty 5, 35d supply, fill #0

## 2024-05-28 MED ORDER — POLYETHYLENE GLYCOL 3350 17 GM/SCOOP PO POWD
17.0000 g | Freq: Every day | ORAL | 0 refills | Status: AC | PRN
  Filled 2024-05-28: qty 238, 14d supply, fill #0

## 2024-05-28 MED ORDER — ASCORBIC ACID 500 MG PO TABS
500.0000 mg | ORAL_TABLET | Freq: Every day | ORAL | 0 refills | Status: AC
  Filled 2024-05-28: qty 30, 30d supply, fill #0

## 2024-05-28 MED ORDER — POLYSACCHARIDE IRON COMPLEX 150 MG PO CAPS
150.0000 mg | ORAL_CAPSULE | Freq: Every day | ORAL | 0 refills | Status: DC
  Filled 2024-05-28: qty 30, 30d supply, fill #0

## 2024-05-28 MED ORDER — HYDROXYZINE HCL 25 MG PO TABS: 25.0000 mg | ORAL_TABLET | Freq: Three times a day (TID) | ORAL | 0 refills | Status: DC | PRN

## 2024-05-28 MED ORDER — INSULIN ASPART 100 UNIT/ML FLEXPEN
8.0000 [IU] | PEN_INJECTOR | Freq: Three times a day (TID) | SUBCUTANEOUS | 0 refills | Status: DC
  Filled 2024-05-28: qty 15, 60d supply, fill #0

## 2024-05-28 MED ORDER — ASPIRIN 81 MG PO TBEC
81.0000 mg | DELAYED_RELEASE_TABLET | Freq: Every day | ORAL | 0 refills | Status: AC
  Filled 2024-05-28: qty 30, 30d supply, fill #0

## 2024-05-28 MED ORDER — GUAIFENESIN ER 600 MG PO TB12
600.0000 mg | ORAL_TABLET | Freq: Two times a day (BID) | ORAL | 0 refills | Status: AC
  Filled 2024-05-28: qty 14, 7d supply, fill #0

## 2024-05-28 MED ORDER — ALUM & MAG HYDROXIDE-SIMETH 400-400-40 MG/5ML PO SUSP
15.0000 mL | ORAL | 0 refills | Status: AC | PRN
  Filled 2024-05-28: qty 355, 8d supply, fill #0

## 2024-05-28 MED ORDER — GERHARDT'S BUTT CREAM
1.0000 | TOPICAL_CREAM | Freq: Three times a day (TID) | CUTANEOUS | 0 refills | Status: AC
  Filled 2024-05-28: qty 60, 20d supply, fill #0

## 2024-05-28 MED ORDER — APIXABAN 5 MG PO TABS
ORAL_TABLET | ORAL | 0 refills | Status: DC
  Filled 2024-05-28: qty 60, 25d supply, fill #0

## 2024-05-28 MED ORDER — GLIMEPIRIDE 2 MG PO TABS
2.0000 mg | ORAL_TABLET | Freq: Every day | ORAL | 0 refills | Status: AC
  Filled 2024-05-28 – 2024-08-10 (×2): qty 30, 30d supply, fill #0

## 2024-05-28 MED ORDER — FINASTERIDE 5 MG PO TABS
5.0000 mg | ORAL_TABLET | Freq: Every day | ORAL | 0 refills | Status: DC
  Filled 2024-05-28: qty 30, 30d supply, fill #0

## 2024-05-28 MED ORDER — TAMSULOSIN HCL 0.4 MG PO CAPS
0.4000 mg | ORAL_CAPSULE | Freq: Every day | ORAL | 0 refills | Status: AC
  Filled 2024-05-28 – 2024-08-10 (×2): qty 30, 30d supply, fill #0

## 2024-05-28 MED ORDER — SENNOSIDES-DOCUSATE SODIUM 8.6-50 MG PO TABS
2.0000 | ORAL_TABLET | Freq: Every evening | ORAL | 0 refills | Status: AC | PRN
  Filled 2024-05-28: qty 14, 7d supply, fill #0

## 2024-05-28 MED ORDER — HYDROXYZINE HCL 25 MG PO TABS
25.0000 mg | ORAL_TABLET | Freq: Three times a day (TID) | ORAL | 0 refills | Status: AC | PRN
  Filled 2024-05-28: qty 15, 5d supply, fill #0

## 2024-05-28 MED ORDER — ROSUVASTATIN CALCIUM 20 MG PO TABS
20.0000 mg | ORAL_TABLET | Freq: Every day | ORAL | 0 refills | Status: DC
  Filled 2024-05-28: qty 30, 30d supply, fill #0

## 2024-05-28 MED ORDER — ONDANSETRON 4 MG PO TBDP
4.0000 mg | ORAL_TABLET | Freq: Four times a day (QID) | ORAL | 0 refills | Status: AC | PRN
  Filled 2024-05-28: qty 20, 5d supply, fill #0

## 2024-05-28 MED ORDER — LIDOCAINE VISCOUS HCL 2 % MT SOLN
15.0000 mL | OROMUCOSAL | 0 refills | Status: AC | PRN
  Filled 2024-05-28: qty 100, 4d supply, fill #0

## 2024-05-28 MED ORDER — CYANOCOBALAMIN 1000 MCG PO TABS
1000.0000 ug | ORAL_TABLET | Freq: Every day | ORAL | 0 refills | Status: AC
  Filled 2024-05-28: qty 30, 30d supply, fill #0

## 2024-05-28 MED ORDER — PANTOPRAZOLE SODIUM 40 MG PO TBEC
40.0000 mg | DELAYED_RELEASE_TABLET | Freq: Two times a day (BID) | ORAL | 0 refills | Status: DC
  Filled 2024-05-28: qty 60, 30d supply, fill #0

## 2024-05-28 MED ORDER — INSULIN PEN NEEDLE 32G X 4 MM MISC
0 refills | Status: AC
  Filled 2024-05-28: qty 100, 30d supply, fill #0

## 2024-05-28 NOTE — Plan of Care (Signed)

## 2024-05-28 NOTE — TOC Progression Note (Addendum)
 Transition of Care (TOC) - Progression Note   Patient from home with wife and wife's son.   Patient requesting ambulance transport home today. NCM confirmed address with patient. Patient states wife is at work but wife's son is at the home and will let him in .   Patient has HHRN,PT and aide with [facility] . NCM left Lynette with [employer] a message . Christain with [facility] checking . They have confirmed patient is active with [facility] and discharge is today   NCM secure chatted MD team for orders and face to face.   Patient has wheelchair and walker at home    1520 Nurse and Attending team ready for ambulance to be called. NCM called [facility] stated there are only 2 patients ahead of him, it won't be long. Nurse and Attending team aware . Patient aware and will call his wife  Patient Details  Name: Micheal Moore. MRN: [id number] Date of Birth: 1951/06/20  Transition of Care [facility]) CM/SW Contact  Latasha Buczkowski, Powell Jansky, RN Phone Number: 05/28/2024, 12:02 PM  Clinical Narrative:       Expected Discharge Plan: Home w Home Health Services Barriers to Discharge:  (PT/OT to see ?)               Expected Discharge Plan and Services   Discharge Planning Services: CM Consult Post Acute Care Choice: Home Health Living arrangements for the past 2 months: Single Family Home                 DME Arranged: N/A DME Agency: NA       HH Arranged: RN, PT, Nurse's Aide HH Agency: [facility] Date HH Agency Contacted: 05/28/24 Time HH Agency Contacted: 1201 Representative spoke with at [facility] Agency: lynette left message   Social Drivers of Health (SDOH) Interventions SDOH Screenings   Food Insecurity: No Food Insecurity (05/27/2024)  Housing: Low Risk  (05/27/2024)  Transportation Needs: No Transportation Needs (05/27/2024)  Utilities: Not At Risk (05/27/2024)  Social Connections: Socially Integrated (05/27/2024)  Tobacco Use: High Risk (05/27/2024)    Readmission Risk  Interventions    06/24/2023    3:31 PM 04/25/2023    1:04 PM 04/12/2023    4:47 PM  Readmission Risk Prevention Plan  Transportation Screening Complete Complete Complete  PCP or Specialist Appt within 3-5 Days  Complete Complete  HRI or Home Care Consult  Complete Complete  Social Work Consult for Recovery Care Planning/Counseling  Complete Complete  Palliative Care Screening  Complete Not Applicable  Medication Review [employer]) Complete Complete Referral to Pharmacy  PCP or Specialist appointment within 3-5 days of discharge Complete    HRI or Home Care Consult Complete    SW Recovery Care/Counseling Consult Complete    Palliative Care Screening Complete    Skilled Nursing Facility Complete

## 2024-05-28 NOTE — Discharge Instructions (Addendum)
 To Micheal Moore Micheal Moore [name]. or their caretakers,  You were recently admitted to [facility] for pulmonary embolism.   Continue taking your home medications with the following changes:  Start taking Eliquis  5mg : 2 tablets today evening followed by 2 tablets taken 2 times every day for 5 days; Follow this with 1 tablet taken 2 times a day for a total of 3 months after which your PCP can decide how long you need to take this for.  Protonix  40 mg: 2 times every day Continue taking All other home medications    You should seek further medical care if you have worsening of current symptoms.  Please follow up with the following doctors/specialties: PCP in 1 week from discharge  [facility] tomorrow or call [phone] to reschedule  We recommend that you also see your primary care doctor in about a week to make sure that you continue to improve. We are so glad that you are feeling better.  Sincerely,  [facility]

## 2024-05-28 NOTE — Evaluation (Signed)
 Physical Therapy Evaluation Patient Details Name: Micheal Moore. MRN: [id number] DOB: 1951/05/30 Today's Date: 05/28/2024  History of Present Illness  Pt is a 73 year old man with multiple recent admissions who presents from home on 05/27/24 with chest pain and N&V. Found to have multiple segmental PEs. Pt also with acute esophagitis. PMH: CAD s/p PCI, DMII, HLD, BPH with urinary retention, L AKA, UTI/sepsis.  Clinical Impression  Pt admitted with above diagnosis. Pt from home with family where he needed assist for all aspects of care and mobility. Pt reports that his son helped him scoot bed to w/c and w/c to car with sliding board but pt declined practicing this today and was unable to scoot along EOB in sitting. Pt needed mod A +2 to come to sitting EOB, expect this is close to what family is giving him at home. Recommend HHPT.  Pt currently with functional limitations due to the deficits listed below (see PT Problem List). Pt will benefit from acute skilled PT to increase their independence and safety with mobility to allow discharge.           If plan is discharge home, recommend the following: Two people to help with walking and/or transfers;A lot of help with bathing/dressing/bathroom;Assistance with cooking/housework;Assist for transportation;Help with stairs or ramp for entrance   Can travel by private vehicle        Equipment Recommendations None recommended by PT  Recommendations for Other Services       Functional Status Assessment Patient has had a recent decline in their functional status and demonstrates the ability to make significant improvements in function in a reasonable and predictable amount of time.     Precautions / Restrictions Precautions Precautions: Fall Restrictions Weight Bearing Restrictions Per Provider Order: No LLE Weight Bearing Per Provider Order: Non weight bearing      Mobility  Bed Mobility Overal bed mobility: Needs Assistance Bed  Mobility: Supine to Sit, Sit to Supine     Supine to sit: +2 for physical assistance, Mod assist, HOB elevated, Used rails Sit to supine: Min assist   General bed mobility comments: pt required mod HHA for trunk as mod A at R hip to come to EOB. Pt is able to lift RLE to return to supine and pull self up in bed by pulling on headboard    Transfers                   General transfer comment: pt declined to get OOB. Did agree to attempt scooting along EOB but was unable to push through RLE to assist with this as R foot kept sliding and he could not get foot under him with limited knee flexion, so could not scoot buttocks along bedside    Ambulation/Gait               General Gait Details: unable at baseline  Stairs            Wheelchair Mobility     Tilt Bed    Modified Rankin (Stroke Patients Only)       Balance Overall balance assessment: Needs assistance Sitting-balance support: Bilateral upper extremity supported, Feet supported Sitting balance-[name]: Fair Sitting balance - Comments: requires B UE support, posterior bias, especially initially Postural control: Posterior lean  Pertinent Vitals/Pain Pain Assessment Pain Assessment: Faces Faces Pain Scale: Hurts even more Pain Location: chest with eating Pain Descriptors / Indicators: Discomfort Pain Intervention(s): Limited activity within patient's tolerance    Home Living Family/patient expects to be discharged to:: Private residence Living Arrangements: Spouse/significant other Available Help at Discharge: Family;Available PRN/intermittently Type of Home: House Home Access: Stairs to enter Entrance Stairs-Rails: None Entrance Stairs-Number of Steps: 2 to the porch and one from porch into home   Home Layout: One level Home Equipment: Agricultural consultant (2 wheels);Wheelchair - manual;Hospital bed;Other (comment) (sliding board) Additional  Comments: son and daughter in law work night shifts and sleep in the day and pt cannot get OOB without help from son so assist is very limited    Prior Function Prior Level of Function : Needs assist             Mobility Comments: assisted for bed mobility and lateral transfers to w/c, must wait for capable family members to assist ADLs Comments: pt can self feed, groom and participate in UB bathing and dressing, he is otherwise dependent     Extremity/Trunk Assessment   Upper Extremity Assessment Upper Extremity Assessment: Defer to OT evaluation    Lower Extremity Assessment Lower Extremity Assessment: RLE deficits/detail;LLE deficits/detail RLE Deficits / Details: pt with wounds R great toe. hip flex 3/5. R knee ROM very limited ~15-50 deg RLE Sensation: history of peripheral neuropathy RLE Coordination: decreased gross motor LLE Deficits / Details: AKA, pt reports that he has a prosthesis and is about to start working with therapy to be able to use it, though I question the validity of this given that he has been unable to stand on RLE for indefinite period of time LLE Sensation: WNL    Cervical / Trunk Assessment Cervical / Trunk Assessment: Other exceptions (weakness)  Communication   Communication Communication: No apparent difficulties    Cognition Arousal: Alert Behavior During Therapy: Flat affect   PT - Cognitive impairments: No family/caregiver present to determine baseline                       PT - Cognition Comments: self limiting Following commands: Impaired Following commands impaired: Follows one step commands with increased time     Cueing Cueing Techniques: Verbal cues, Gestural cues, Tactile cues     General Comments General comments (skin integrity, edema, etc.): encouraged pt to try to get OOB to chair but pt unwavering in his determination to stay in the bed    Exercises     Assessment/Plan    PT Assessment Patient needs  continued PT services  PT Problem List Decreased strength;Decreased range of motion;Decreased activity tolerance;Decreased balance;Decreased mobility;Decreased coordination;Pain;Impaired sensation       PT Treatment Interventions DME instruction;Functional mobility training;Therapeutic activities;Therapeutic exercise;Balance training;Neuromuscular re-education;Patient/family education    PT Goals (Current goals can be found in the Care Plan section)  Acute Rehab PT Goals Patient Stated Goal: return home today PT Goal Formulation: With patient Time For Goal Achievement: 06/11/24 Potential to Achieve Goals: Fair    Frequency Min 1X/week     Co-evaluation PT/OT/SLP Co-Evaluation/Treatment: Yes Reason for Co-Treatment: For patient/therapist safety PT goals addressed during session: Mobility/safety with mobility;Balance OT goals addressed during session: ADL's and self-care       AM-PAC PT 6 Clicks Mobility  Outcome Measure Help needed turning from your back to your side while in a flat bed without using bedrails?: A Lot Help needed moving from lying  on your back to sitting on the side of a flat bed without using bedrails?: Total Help needed moving to and from a bed to a chair (including a wheelchair)?: Total Help needed standing up from a chair using your arms (e.g., wheelchair or bedside chair)?: Total Help needed to walk in hospital room?: Total Help needed climbing 3-5 steps with a railing? : Total 6 Click Score: 7    End of Session   Activity Tolerance: Patient tolerated treatment well Patient left: in bed;with call bell/phone within reach;with bed alarm set Nurse Communication: Mobility status PT Visit Diagnosis: Pain;Other abnormalities of gait and mobility (R26.89) Pain - part of body:  (chest)    Time: 8953-8889 PT Time Calculation (min) (ACUTE ONLY): 24 min   Charges:   PT Evaluation $PT Eval Moderate Complexity: 1 Mod   PT General Charges $$ ACUTE PT  VISIT: 1 Visit         Richerd Lipoma, PT  Acute Rehab Services Secure chat preferred Office [phone]   Richerd CROME Cannie Muckle 05/28/2024, 1:55 PM

## 2024-05-28 NOTE — Progress Notes (Signed)
 AVS in folder for transport and TOC meds givien to Floor nurse.  Stored with chart

## 2024-05-28 NOTE — Progress Notes (Signed)
 Reviewed AVS with patient.

## 2024-05-28 NOTE — Evaluation (Signed)
 Occupational Therapy Evaluation and Discharge Patient Details Name: Micheal Moore. MRN: [id number] DOB: [birth year] Today's Date: 05/28/2024   History of Present Illness   Pt is a 73 year old man with multiple recent admissions who presents from home on 05/27/24 with chest pain and N&V. Found to have multiple segmental PEs. Pt also with acute esophagitis. PMH: CAD s/p PCI, DMII, HLD, BPH with urinary retention, L AKA, UTI/sepsis.     Clinical Impressions Pt is functioning at his baseline in ADLs and likely in bed mobility. He declined OOB to chair. Highly focused on going home later today. Pt demonstrates impaired cognition and generalized weakness. He needs +2 mod assist for supine to sit and is able to sit with UE support at EOB. Recommended pt consider BSC for home, he declined stating getting OOB was too tiresome. Provided bed pan for home. No acute OT needs.      If plan is discharge home, recommend the following:   Two people to help with walking and/or transfers;A lot of help with bathing/dressing/bathroom;Assistance with cooking/housework;Direct supervision/assist for medications management;Direct supervision/assist for financial management;Assist for transportation     Functional Status Assessment   Patient has not had a recent decline in their functional status     Equipment Recommendations   None recommended by OT     Recommendations for Other Services         Precautions/Restrictions   Precautions Precautions: Fall Restrictions Weight Bearing Restrictions Per Provider Order: No     Mobility Bed Mobility Overal bed mobility: Needs Assistance Bed Mobility: Supine to Sit, Sit to Supine     Supine to sit: +2 for physical assistance, Mod assist, HOB elevated, Used rails Sit to supine: Min assist        Transfers                   General transfer comment: pt declined      Balance Overall balance assessment: Needs  assistance Sitting-balance support: Bilateral upper extremity supported, Feet supported Sitting balance-[name]: Fair Sitting balance - Comments: requires B UE support                                   ADL either performed or assessed with clinical judgement   ADL Overall ADL's : At baseline                                             Vision Ability to See in Adequate Light: 0 Adequate Patient Visual Report: No change from baseline       Perception         Praxis         Pertinent Vitals/Pain Pain Assessment Pain Assessment: No/denies pain     Extremity/Trunk Assessment Upper Extremity Assessment Upper Extremity Assessment: Overall WFL for tasks assessed   Lower Extremity Assessment Lower Extremity Assessment: Defer to PT evaluation   Cervical / Trunk Assessment Cervical / Trunk Assessment: Other exceptions (weakness)   Communication Communication Communication: No apparent difficulties   Cognition Arousal: Alert Behavior During Therapy: Flat affect Cognition: No family/caregiver present to determine baseline             OT - Cognition Comments: pt is not oriented to situation, decreased reasoning and insight  Following commands: Impaired Following commands impaired: Follows one step commands with increased time     Cueing  General Comments   Cueing Techniques: Verbal cues;Gestural cues;Tactile cues      Exercises     Shoulder Instructions      Home Living Family/patient expects to be discharged to:: Private residence Living Arrangements: Spouse/significant other Available Help at Discharge: Family;Available PRN/intermittently Type of Home: House Home Access: Stairs to enter [employer] of Steps: 2 to the porch and one from porch into home Entrance Stairs-Rails: None Home Layout: One level     Bathroom Shower/Tub: Producer, television/film/video: Standard      Home Equipment: Agricultural consultant (2 wheels);Wheelchair - manual;Hospital bed;Other (comment) (sliding board)          Prior Functioning/Environment Prior Level of Function : Needs assist             Mobility Comments: assisted for bed mobility and lateral transfers to w/c, must wait for capable family members to assist ADLs Comments: pt can self feed, groom and participate in UB bathing and dressing, he is otherwise dependent    OT Problem List:     OT Treatment/Interventions:        OT Goals(Current goals can be found in the care plan section)       OT Frequency:       Co-evaluation PT/OT/SLP Co-Evaluation/Treatment: Yes Reason for Co-Treatment: For patient/therapist safety   OT goals addressed during session: ADL's and self-care      AM-PAC OT 6 Clicks Daily Activity     Outcome Measure Help from another person eating meals?: None Help from another person taking care of personal grooming?: A Little Help from another person toileting, which includes using toliet, bedpan, or urinal?: Total Help from another person bathing (including washing, rinsing, drying)?: A Lot Help from another person to put on and taking off regular upper body clothing?: A Lot Help from another person to put on and taking off regular lower body clothing?: Total 6 Click Score: 13   End of Session    Activity Tolerance: Patient tolerated treatment well Patient left: in bed;with call bell/phone within reach;with bed alarm set  OT Visit Diagnosis: Muscle weakness (generalized) (M62.81);Other symptoms and signs involving cognitive function                Time: 1040-1105 OT Time Calculation (min): 25 min Charges:  OT General Charges $OT Visit: 1 Visit OT Evaluation $OT Eval Moderate Complexity: 1 Mod  Micheal Moore, OTR/L Acute Rehabilitation Services Office: [phone]   Micheal Moore 05/28/2024, 12:41 PM

## 2024-05-28 NOTE — Discharge Summary (Signed)
 Name: Micheal Moore. MRN: [id number] DOB: 1951/07/12 73 y.o. PCP: [facility]  Date of Admission: 05/27/2024  5:49 AM Date of Discharge: 05/28/2024 Attending Physician: Dr. Dayton Eastern  Discharge Diagnosis: 1. Principal Problem:   Acute esophagitis Active Problems:   Nausea with vomiting   Multiple subsegmental pulmonary emboli without acute cor pulmonale (HCC)   Superficial femoral artery occlusion (HCC)   Pulmonary embolism (HCC)   Discharge Medications: Allergies as of 05/28/2024   No Known Allergies      Medication List     PAUSE taking these medications    oxybutynin  5 MG tablet Wait to take this until your doctor or other care provider tells you to start again. Commonly known as: DITROPAN  Take 1 tablet (5 mg total) by mouth every 8 (eight) hours as needed for bladder spasms. What changed: when to take this       STOP taking these medications    atorvastatin  10 MG tablet Commonly known as: LIPITOR    bisacodyl  5 MG EC tablet Generic drug: bisacodyl    linezolid  600 MG tablet Commonly known as: ZYVOX        TAKE these medications    alum & mag hydroxide-simeth 400-400-40 MG/5ML suspension Commonly known as: MAALOX PLUS Take 15 mLs by mouth every 4 (four) hours as needed for indigestion.   apixaban  5 MG Tabs tablet Commonly known as: ELIQUIS  Take 2 tablets (10 mg total) by mouth 2 (two) times daily for 5 days, THEN 1 tablet (5 mg total) 2 (two) times daily. Start taking on: May 28, 2024   ascorbic acid  500 MG tablet Commonly known as: VITAMIN C  Take 1 tablet (500 mg total) by mouth daily.   aspirin  EC 81 MG tablet Take 1 tablet (81 mg total) by mouth daily. Swallow whole.   cyanocobalamin  1000 MCG tablet Take 1 tablet (1,000 mcg total) by mouth daily.   finasteride  5 MG tablet Commonly known as: PROSCAR  Take 1 tablet (5 mg total) by mouth daily.   Gerhardt's butt cream Crea Apply 1 Application topically 3  (three) times daily.   glimepiride  2 MG tablet Commonly known as: AMARYL  Take 1 tablet (2 mg total) by mouth daily.   guaiFENesin  600 MG 12 hr tablet Commonly known as: MUCINEX  Take 1 tablet (600 mg total) by mouth 2 (two) times daily.   hydrocerin Crea Apply 1 Application topically 2 (two) times daily.   hydrOXYzine  25 MG tablet Commonly known as: ATARAX  Take 1 tablet (25 mg total) by mouth every 8 (eight) hours as needed for itching.   insulin  aspart 100 UNIT/ML FlexPen Commonly known as: NOVOLOG  Inject 8 Units into the skin 3 (three) times daily with meals. Only take if eating a meal AND Blood Glucose (BG) is 80 or higher.   insulin  glargine 100 UNIT/ML Solostar Pen Commonly known as: LANTUS  Inject 20 Units into the skin 2 (two) times daily.   iron  polysaccharides 150 MG capsule Commonly known as: NIFEREX Take 1 capsule (150 mg total) by mouth daily.   lidocaine  2 % solution Commonly known as: XYLOCAINE  Use as directed 15 mLs in the mouth or throat as needed for mouth pain.   metoprolol  tartrate 25 MG tablet Commonly known as: LOPRESSOR  Take 1 tablet (25 mg total) by mouth 2 (two) times daily.   ondansetron  4 MG disintegrating tablet Commonly known as: ZOFRAN -ODT Take 1 tablet (4 mg total) by mouth every 6 (six) hours as needed.   pantoprazole  40 MG tablet Commonly known as:  PROTONIX  Take 1 tablet (40 mg total) by mouth 2 (two) times daily.   polyethylene glycol powder 17 GM/SCOOP powder Commonly known as: GLYCOLAX /MIRALAX  Take 17 g by mouth daily as needed for mild constipation.   rosuvastatin  20 MG tablet Commonly known as: CRESTOR  Take 1 tablet (20 mg total) by mouth daily. What changed:  medication strength how much to take   senna-docusate 8.6-50 MG tablet Commonly known as: Senokot-S Take 2 tablets by mouth at bedtime as needed for mild constipation.   tamsulosin  0.4 MG Caps capsule Commonly known as: FLOMAX  Take 1 capsule (0.4 mg total) by mouth  daily after supper.   Vitamin D  (Ergocalciferol ) 1.25 MG (50000 UNIT) Caps capsule Commonly known as: DRISDOL  Take 1 capsule (50,000 Units total) by mouth every 7 (seven) days.        Disposition and follow-up:   Mr.Marqui E Glorine Raddle. was discharged from [facility] in Good condition.  At the hospital follow up visit please address:  1.  Difficulty swallowing, nausea, vomiting, any CP, SOB -Pt is taking Eliquis  dose and frequency properly -ask if he f/u with GI appt.  2.  Labs / imaging needed at time of follow-up: CBC  3.  Pending labs/ test needing follow-up: none  Follow-up Appointments:  Follow-up Information     [facility] Follow up.   Specialty: Home Health Services Contact information: [address] [place] [address] [place] [address] [phone]         Cara Elida HERO, NP. Call.   Specialty: Gastroenterology Contact information: [address] [place] [address] [phone]                  Hospital Course by problem list: Micheal Moore. is a 73 y.o. male with pertinent PMH of CAD s/p PCI, T2DM, BPH,  peripheral vascular disease s/p left AKA, and prior tobacco use disorder who presented with nausea and vomiting and was admitted for pulmonary emboli and acute esophagitis.  Segmental and subsegmental pulmonary emboli, right middle lobe Patient's CTA showed segmental and subsegmental pulmonary emboli in the right middle lobe without evidence of right heart strain.  Patient was satting well at 100% on room air on admission.  Troponins were normal and EKG did not show any changes from prior.  His PE was likely multifactorial in nature with him being bedbound being one of the reasons.  Patient also has a history of significant peripheral vascular disease and documentation of melanoma that can also lead to causing PE.  Thus, we do think his PE was likely due to decreased mobility and  possible malignancy.  Patient was started on Eliquis  10 mg twice daily for 7 days and then 5 mg twice daily after this for at least 3 months.  His home medications were unable to be confirmed.  On our evaluation during discharge, patient denied any shortness of breath.  Pt looked stable. He just complained about pain while he was swallowing food.  Acute esophagitis Nausea vomiting Patient came in with nausea and vomiting for the past 4 days.  CT scan showed esophagitis that could be inflammatory versus infectious.  On-call GI team agreed to set up outpatient follow-up on 05/29/2024 as EGD was warranted.  Patient's symptoms were controlled with Zofran  twice daily, PPI along with Maalox/Mylanta.  Patient will f/u with [facility] follow-up on 05/29/24 at 8:30 am.    Left superficial femoral artery occlusion Patient CT also showed complete  occlusion of the proximal left superficial femoral artery. Pt did not endorse any pain in left leg and on PE on discharge, both his legs were warm and well perfused. This will also be managed by his eliquis .    Type 2 diabetes  Pt's recent A1c was 8.2. Per charting he was on glimepiride  and insuline at home. However, home meds could not be confirmed through wife and we were unable to get in contact with his primary care. Treated pt with SSI with meals 3 times daily and at night.   CAD Peripheral vascular disease Did not see any signs of critical limb ischemia. Will resume her home Asprin and statin. Starting eliquis  should also help.      Subjective Saw pt at bedside this AM. He was resting in bed having his breakfast. [place] he had some pain while swallowing food. Denied any SOB, CP, other acute complaints. Does not have his foley in anymore.   Discharge Exam:   BP 110/75   Pulse (!) 101   Temp 98 F (36.7 C) (Oral)   Resp 17   Ht 6' 5 (1.956 m)   Wt 88.5 kg   SpO2 95%   BMI 23.14 kg/m  Discharge exam:   Gen: A&Ox3, in NAD CVR: RRR-no  murmurs/r/g Pulmonary: CTAB MSK: BL LE were not swollen with no tenderness on palpation Skin: warm Psych: normal affect   Pertinent Labs, Studies, and Procedures:     Latest Ref Rng & Units 05/28/2024    3:54 AM 05/27/2024    6:12 AM 03/31/2024   11:50 PM  CBC  WBC 4.0 - 10.5 K/uL 8.4  10.6  5.6   Hemoglobin 13.0 - 17.0 g/dL 88.0  86.9  88.4   Hematocrit 39.0 - 52.0 % 36.0  38.9  35.7   Platelets 150 - 400 K/uL 206  217  134        Latest Ref Rng & Units 05/28/2024    3:54 AM 05/27/2024    8:28 AM 05/27/2024    6:12 AM  CMP  Glucose 70 - 99 mg/dL 876   798   BUN 8 - 23 mg/dL 11   13   Creatinine 9.38 - 1.24 mg/dL 9.17   9.12   Sodium 864 - 145 mmol/L 136   142   Potassium 3.5 - 5.1 mmol/L 3.9   3.9   Chloride 98 - 111 mmol/L 101   103   CO2 22 - 32 mmol/L 24   25   Calcium  8.9 - 10.3 mg/dL 8.9   8.9   Total Protein 6.5 - 8.1 g/dL  6.2    Total Bilirubin 0.0 - 1.2 mg/dL  1.1    Alkaline Phos 38 - 126 U/L  57    AST 15 - 41 U/L  14    ALT 0 - 44 U/L  10      CT Angio Chest/Abd/Pel for Dissection W and/or Wo Contrast Result Date: 05/27/2024 CLINICAL DATA:  Acute aortic syndrome (AAS) suspected EXAM: CT ANGIOGRAPHY CHEST, ABDOMEN AND PELVIS TECHNIQUE: Non-contrast CT of the chest was initially obtained. Multidetector CT imaging through the chest, abdomen and pelvis was performed using the standard protocol during bolus administration of intravenous contrast. Multiplanar reconstructed images and MIPs were obtained and reviewed to evaluate the vascular anatomy. RADIATION DOSE REDUCTION: This exam was performed according to the departmental dose-optimization program which includes automated exposure control, adjustment of the mA and/or kV according to patient size and/or use of iterative  reconstruction technique. CONTRAST:  OMNIPAQUE  IOHEXOL  350 MG/ML SOLN COMPARISON:  01/14/2024, 10/01/2022 FINDINGS: CTA CHEST FINDINGS Pulmonary Embolism: Small burden segmental and distal segmental  pulmonary emboli within the right upper and right middle lobes. No findings of right heart strain. Cardiovascular: No cardiomegaly or pericardial effusion. No aortic aneurysm, intramural hematoma, or aortic dissection. Mediastinum/Nodes: No mediastinal mass. No mediastinal, hilar, or axillary lymphadenopathy. Lungs/Pleura: The midline trachea and bronchi are patent. Subpleural reticulation in the dependent aspects of both lower lobes. There may be some bronchiolectasis or early honeycombing on the right. No focal airspace consolidation, pleural effusion, or pneumothorax. Review of the MIP images confirms the above findings. CTA ABDOMEN AND PELVIS FINDINGS VASCULAR Aorta: No aortic aneurysm or aortic dissection. Calcified and noncalcified aortic atherosclerosis. No hemodynamically significant stenosis. Celiac: Patent without acute thrombus, aneurysm, or dissection. No hemodynamically significant stenosis. SMA: Patent without acute thrombus, aneurysm, or dissection. No hemodynamically significant stenosis. Renals: 2 patent renal arteries bilaterally without acute thrombus, aneurysm, or dissection. No hemodynamically significant stenosis. IMA: Patent without acute thrombus, aneurysm, or dissection. Moderate ostial stenosis from noncalcified plaque. Inflow: Patent without acute thrombus, aneurysm, or dissection. No hemodynamically significant stenosis. Proximal Outflow: Complete occlusion of the proximal left SFA, worrisome from acute thrombus. Veins: Nondiagnostic evaluation of the veins due to arterial timing of the contrast bolus. Review of the MIP images confirms the above findings. NON-VASCULAR Hepatobiliary: No mass.No radiopaque stones or wall thickening of the gallbladder. No intrahepatic or extrahepatic biliary ductal dilation. Pancreas: No mass or main ductal dilation.No peripancreatic inflammation or fluid collection. Spleen: Normal size. No mass. Adrenals/Urinary Tract: No adrenal masses. No renal mass. No  hydronephrosis or nephrolithiasis. The decompressed urinary bladder is obscured by metallic streak artifact from orthopedic hardware. Stomach/Bowel: Moderate circumferential wall thickening of the mid and distal esophagus. Ingested material layering in the stomach without focal abnormality. No small bowel wall thickening or inflammation. No small bowel obstruction.Normal appendix. Lymphatic: No intraabdominal or pelvic lymphadenopathy. Reproductive: No prostatomegaly. No free pelvic fluid. Other: No pneumoperitoneum, ascites, or mesenteric inflammation. Musculoskeletal: No acute fracture or destructive lesion.Osteopenia. Remote bilateral rib fractures with bony fusion between couple of right-sided ribs. Multilevel degenerative disc disease of the spine. Diffuse osteopenia. Left hip arthroplasty is anatomically aligned without dislocation. Partially visualized right femoral intramedullary nail. Heterotopic bone formation noted around both hips. On the left, there is partial fusion or pseudoarthrosis between the left iliac bone and left femur. Review of the MIP images confirms the above findings. IMPRESSION: 1. No aortic aneurysm, intramural hematoma, or aortic dissection. 2. Small burden segmental and subsegmental pulmonary emboli in the right middle lobe. No findings of right heart strain. 3. Complete occlusion of the proximal left superficial femoral artery, worrisome for acute thrombus. 4. Findings consistent with an infectious or inflammatory esophagitis. Nonemergent upper endoscopy may be of benefit for further characterization. 5. Subpleural reticulation in the dependent aspects of both lower lobes. There may be some bronchiolectasis or early honeycombing on the right. While this is not significantly changed, it could reflect an underlying interstitial lung disease. Critical Value/emergent results were called by telephone at the time of interpretation on 05/27/2024 at 8:29 am to provider Lavonia Pat, MD, who  verbally acknowledged these results. Aortic Atherosclerosis (ICD10-I70.0). Electronically Signed   By: Rogelia Myers M.D.   On: 05/27/2024 08:38   DG Chest Portable 1 View Result Date: 05/27/2024 EXAM: 1 VIEW XRAY OF THE CHEST 05/27/2024 06:32:00 AM COMPARISON: 02/25/2024 CLINICAL HISTORY: Chest pain. FINDINGS: LUNGS AND PLEURA: No  focal pulmonary opacity. No pulmonary edema. No pleural effusion. No pneumothorax. HEART AND MEDIASTINUM: No acute abnormality of the cardiac and mediastinal silhouettes. BONES AND SOFT TISSUES: No acute osseous abnormality. Surgical clips noted within the right axilla. EKG leads noted. IMPRESSION: 1. No acute process. Electronically signed by: Waddell Calk MD 05/27/2024 06:40 AM EDT RP Workstation: [id number]     Discharge Instructions: Discharge Instructions     Diet - low sodium heart healthy   Complete by: As directed    Increase activity slowly   Complete by: As directed        Signed: Edgardo Pontiff, DO 05/28/2024, 3:13 PM

## 2024-05-28 NOTE — Care Management Obs Status (Signed)
 MEDICARE OBSERVATION STATUS NOTIFICATION   Patient Details  Name: Micheal Moore. MRN: [id number] Date of Birth: 1951-08-13   Medicare Observation Status Notification Given:  Yes    Jon Cruel 05/28/2024, 9:14 AM

## 2024-05-29 ENCOUNTER — Ambulatory Visit: Admitting: Nurse Practitioner

## 2024-05-29 DIAGNOSIS — I110 Hypertensive heart disease with heart failure: Secondary | ICD-10-CM | POA: Diagnosis not present

## 2024-05-29 DIAGNOSIS — E785 Hyperlipidemia, unspecified: Secondary | ICD-10-CM | POA: Diagnosis not present

## 2024-05-29 DIAGNOSIS — J439 Emphysema, unspecified: Secondary | ICD-10-CM | POA: Diagnosis not present

## 2024-05-29 DIAGNOSIS — I5032 Chronic diastolic (congestive) heart failure: Secondary | ICD-10-CM | POA: Diagnosis not present

## 2024-05-29 DIAGNOSIS — E1169 Type 2 diabetes mellitus with other specified complication: Secondary | ICD-10-CM | POA: Diagnosis not present

## 2024-05-29 DIAGNOSIS — F0394 Unspecified dementia, unspecified severity, with anxiety: Secondary | ICD-10-CM | POA: Diagnosis not present

## 2024-05-29 DIAGNOSIS — L89152 Pressure ulcer of sacral region, stage 2: Secondary | ICD-10-CM | POA: Diagnosis not present

## 2024-05-29 DIAGNOSIS — E1151 Type 2 diabetes mellitus with diabetic peripheral angiopathy without gangrene: Secondary | ICD-10-CM | POA: Diagnosis not present

## 2024-05-29 DIAGNOSIS — F419 Anxiety disorder, unspecified: Secondary | ICD-10-CM | POA: Diagnosis not present

## 2024-06-02 DIAGNOSIS — F419 Anxiety disorder, unspecified: Secondary | ICD-10-CM | POA: Diagnosis not present

## 2024-06-02 DIAGNOSIS — I5032 Chronic diastolic (congestive) heart failure: Secondary | ICD-10-CM | POA: Diagnosis not present

## 2024-06-02 DIAGNOSIS — E1169 Type 2 diabetes mellitus with other specified complication: Secondary | ICD-10-CM | POA: Diagnosis not present

## 2024-06-02 DIAGNOSIS — I110 Hypertensive heart disease with heart failure: Secondary | ICD-10-CM | POA: Diagnosis not present

## 2024-06-02 DIAGNOSIS — J439 Emphysema, unspecified: Secondary | ICD-10-CM | POA: Diagnosis not present

## 2024-06-02 DIAGNOSIS — E785 Hyperlipidemia, unspecified: Secondary | ICD-10-CM | POA: Diagnosis not present

## 2024-06-02 DIAGNOSIS — E1151 Type 2 diabetes mellitus with diabetic peripheral angiopathy without gangrene: Secondary | ICD-10-CM | POA: Diagnosis not present

## 2024-06-02 DIAGNOSIS — L89152 Pressure ulcer of sacral region, stage 2: Secondary | ICD-10-CM | POA: Diagnosis not present

## 2024-06-02 DIAGNOSIS — F0394 Unspecified dementia, unspecified severity, with anxiety: Secondary | ICD-10-CM | POA: Diagnosis not present

## 2024-06-04 ENCOUNTER — Other Ambulatory Visit (HOSPITAL_BASED_OUTPATIENT_CLINIC_OR_DEPARTMENT_OTHER): Payer: Self-pay

## 2024-06-05 DIAGNOSIS — E785 Hyperlipidemia, unspecified: Secondary | ICD-10-CM | POA: Diagnosis not present

## 2024-06-05 DIAGNOSIS — E1169 Type 2 diabetes mellitus with other specified complication: Secondary | ICD-10-CM | POA: Diagnosis not present

## 2024-06-05 DIAGNOSIS — E1151 Type 2 diabetes mellitus with diabetic peripheral angiopathy without gangrene: Secondary | ICD-10-CM | POA: Diagnosis not present

## 2024-06-05 DIAGNOSIS — J439 Emphysema, unspecified: Secondary | ICD-10-CM | POA: Diagnosis not present

## 2024-06-05 DIAGNOSIS — I5032 Chronic diastolic (congestive) heart failure: Secondary | ICD-10-CM | POA: Diagnosis not present

## 2024-06-05 DIAGNOSIS — L89152 Pressure ulcer of sacral region, stage 2: Secondary | ICD-10-CM | POA: Diagnosis not present

## 2024-06-05 DIAGNOSIS — F0394 Unspecified dementia, unspecified severity, with anxiety: Secondary | ICD-10-CM | POA: Diagnosis not present

## 2024-06-05 DIAGNOSIS — I110 Hypertensive heart disease with heart failure: Secondary | ICD-10-CM | POA: Diagnosis not present

## 2024-06-05 DIAGNOSIS — F419 Anxiety disorder, unspecified: Secondary | ICD-10-CM | POA: Diagnosis not present

## 2024-06-08 ENCOUNTER — Telehealth: Payer: Self-pay

## 2024-06-08 DIAGNOSIS — F419 Anxiety disorder, unspecified: Secondary | ICD-10-CM | POA: Diagnosis not present

## 2024-06-08 DIAGNOSIS — I5032 Chronic diastolic (congestive) heart failure: Secondary | ICD-10-CM | POA: Diagnosis not present

## 2024-06-08 DIAGNOSIS — I110 Hypertensive heart disease with heart failure: Secondary | ICD-10-CM | POA: Diagnosis not present

## 2024-06-08 DIAGNOSIS — E785 Hyperlipidemia, unspecified: Secondary | ICD-10-CM | POA: Diagnosis not present

## 2024-06-08 DIAGNOSIS — F0394 Unspecified dementia, unspecified severity, with anxiety: Secondary | ICD-10-CM | POA: Diagnosis not present

## 2024-06-08 DIAGNOSIS — E1169 Type 2 diabetes mellitus with other specified complication: Secondary | ICD-10-CM | POA: Diagnosis not present

## 2024-06-08 DIAGNOSIS — L89152 Pressure ulcer of sacral region, stage 2: Secondary | ICD-10-CM | POA: Diagnosis not present

## 2024-06-08 DIAGNOSIS — J439 Emphysema, unspecified: Secondary | ICD-10-CM | POA: Diagnosis not present

## 2024-06-08 DIAGNOSIS — E1151 Type 2 diabetes mellitus with diabetic peripheral angiopathy without gangrene: Secondary | ICD-10-CM | POA: Diagnosis not present

## 2024-06-08 NOTE — Telephone Encounter (Signed)
 Called the patient and his wife and they directed me to contact the patients sister to schedule appointments. Scheduled appointments with the patients sister.

## 2024-06-12 DIAGNOSIS — I5032 Chronic diastolic (congestive) heart failure: Secondary | ICD-10-CM | POA: Diagnosis not present

## 2024-06-12 DIAGNOSIS — J439 Emphysema, unspecified: Secondary | ICD-10-CM | POA: Diagnosis not present

## 2024-06-12 DIAGNOSIS — I110 Hypertensive heart disease with heart failure: Secondary | ICD-10-CM | POA: Diagnosis not present

## 2024-06-12 DIAGNOSIS — F0394 Unspecified dementia, unspecified severity, with anxiety: Secondary | ICD-10-CM | POA: Diagnosis not present

## 2024-06-12 DIAGNOSIS — E785 Hyperlipidemia, unspecified: Secondary | ICD-10-CM | POA: Diagnosis not present

## 2024-06-12 DIAGNOSIS — E1169 Type 2 diabetes mellitus with other specified complication: Secondary | ICD-10-CM | POA: Diagnosis not present

## 2024-06-12 DIAGNOSIS — F419 Anxiety disorder, unspecified: Secondary | ICD-10-CM | POA: Diagnosis not present

## 2024-06-12 DIAGNOSIS — E1151 Type 2 diabetes mellitus with diabetic peripheral angiopathy without gangrene: Secondary | ICD-10-CM | POA: Diagnosis not present

## 2024-06-22 DIAGNOSIS — I5032 Chronic diastolic (congestive) heart failure: Secondary | ICD-10-CM | POA: Diagnosis not present

## 2024-06-22 DIAGNOSIS — F0394 Unspecified dementia, unspecified severity, with anxiety: Secondary | ICD-10-CM | POA: Diagnosis not present

## 2024-06-22 DIAGNOSIS — E1169 Type 2 diabetes mellitus with other specified complication: Secondary | ICD-10-CM | POA: Diagnosis not present

## 2024-06-22 DIAGNOSIS — E1151 Type 2 diabetes mellitus with diabetic peripheral angiopathy without gangrene: Secondary | ICD-10-CM | POA: Diagnosis not present

## 2024-06-22 DIAGNOSIS — F419 Anxiety disorder, unspecified: Secondary | ICD-10-CM | POA: Diagnosis not present

## 2024-06-22 DIAGNOSIS — J439 Emphysema, unspecified: Secondary | ICD-10-CM | POA: Diagnosis not present

## 2024-06-22 DIAGNOSIS — L89152 Pressure ulcer of sacral region, stage 2: Secondary | ICD-10-CM | POA: Diagnosis not present

## 2024-06-22 DIAGNOSIS — E785 Hyperlipidemia, unspecified: Secondary | ICD-10-CM | POA: Diagnosis not present

## 2024-07-01 DIAGNOSIS — I110 Hypertensive heart disease with heart failure: Secondary | ICD-10-CM | POA: Diagnosis not present

## 2024-07-07 DIAGNOSIS — I5032 Chronic diastolic (congestive) heart failure: Secondary | ICD-10-CM | POA: Diagnosis not present

## 2024-07-07 DIAGNOSIS — I110 Hypertensive heart disease with heart failure: Secondary | ICD-10-CM | POA: Diagnosis not present

## 2024-07-07 DIAGNOSIS — L89152 Pressure ulcer of sacral region, stage 2: Secondary | ICD-10-CM | POA: Diagnosis not present

## 2024-07-07 DIAGNOSIS — E1169 Type 2 diabetes mellitus with other specified complication: Secondary | ICD-10-CM | POA: Diagnosis not present

## 2024-07-07 DIAGNOSIS — E785 Hyperlipidemia, unspecified: Secondary | ICD-10-CM | POA: Diagnosis not present

## 2024-07-07 DIAGNOSIS — E1151 Type 2 diabetes mellitus with diabetic peripheral angiopathy without gangrene: Secondary | ICD-10-CM | POA: Diagnosis not present

## 2024-07-07 DIAGNOSIS — J439 Emphysema, unspecified: Secondary | ICD-10-CM | POA: Diagnosis not present

## 2024-07-07 DIAGNOSIS — F419 Anxiety disorder, unspecified: Secondary | ICD-10-CM | POA: Diagnosis not present

## 2024-07-08 ENCOUNTER — Inpatient Hospital Stay (HOSPITAL_BASED_OUTPATIENT_CLINIC_OR_DEPARTMENT_OTHER)

## 2024-07-08 ENCOUNTER — Inpatient Hospital Stay

## 2024-07-08 VITALS — BP 104/67 | HR 81 | Temp 97.4°F | Resp 18

## 2024-07-08 DIAGNOSIS — R918 Other nonspecific abnormal finding of lung field: Secondary | ICD-10-CM | POA: Diagnosis present

## 2024-07-08 DIAGNOSIS — Z79899 Other long term (current) drug therapy: Secondary | ICD-10-CM | POA: Insufficient documentation

## 2024-07-08 DIAGNOSIS — Z7901 Long term (current) use of anticoagulants: Secondary | ICD-10-CM | POA: Insufficient documentation

## 2024-07-08 DIAGNOSIS — I2699 Other pulmonary embolism without acute cor pulmonale: Secondary | ICD-10-CM

## 2024-07-08 DIAGNOSIS — Z7401 Bed confinement status: Secondary | ICD-10-CM | POA: Diagnosis not present

## 2024-07-08 DIAGNOSIS — Z85820 Personal history of malignant melanoma of skin: Secondary | ICD-10-CM | POA: Insufficient documentation

## 2024-07-08 DIAGNOSIS — Z86711 Personal history of pulmonary embolism: Secondary | ICD-10-CM | POA: Diagnosis not present

## 2024-07-08 DIAGNOSIS — R778 Other specified abnormalities of plasma proteins: Secondary | ICD-10-CM | POA: Diagnosis not present

## 2024-07-08 DIAGNOSIS — C439 Malignant melanoma of skin, unspecified: Secondary | ICD-10-CM

## 2024-07-08 DIAGNOSIS — D472 Monoclonal gammopathy: Secondary | ICD-10-CM

## 2024-07-08 LAB — CBC WITH DIFFERENTIAL (CANCER CENTER ONLY)
Abs Immature Granulocytes: 0.01 K/uL (ref 0.00–0.07)
Basophils Absolute: 0.1 K/uL (ref 0.0–0.1)
Basophils Relative: 1 %
Eosinophils Absolute: 0.2 K/uL (ref 0.0–0.5)
Eosinophils Relative: 3 %
HCT: 42.3 % (ref 39.0–52.0)
Hemoglobin: 14.1 g/dL (ref 13.0–17.0)
Immature Granulocytes: 0 %
Lymphocytes Relative: 33 %
Lymphs Abs: 2.4 K/uL (ref 0.7–4.0)
MCH: 31.1 pg (ref 26.0–34.0)
MCHC: 33.3 g/dL (ref 30.0–36.0)
MCV: 93.4 fL (ref 80.0–100.0)
Monocytes Absolute: 0.4 K/uL (ref 0.1–1.0)
Monocytes Relative: 5 %
Neutro Abs: 4.2 K/uL (ref 1.7–7.7)
Neutrophils Relative %: 58 %
Platelet Count: 249 K/uL (ref 150–400)
RBC: 4.53 MIL/uL (ref 4.22–5.81)
RDW: 13.4 % (ref 11.5–15.5)
WBC Count: 7.3 K/uL (ref 4.0–10.5)
nRBC: 0 % (ref 0.0–0.2)

## 2024-07-08 LAB — CMP (CANCER CENTER ONLY)
ALT: 20 U/L (ref 0–44)
AST: 17 U/L (ref 15–41)
Albumin: 4.5 g/dL (ref 3.5–5.0)
Alkaline Phosphatase: 73 U/L (ref 38–126)
Anion gap: 11 (ref 5–15)
BUN: 18 mg/dL (ref 8–23)
CO2: 24 mmol/L (ref 22–32)
Calcium: 10.1 mg/dL (ref 8.9–10.3)
Chloride: 102 mmol/L (ref 98–111)
Creatinine: 0.92 mg/dL (ref 0.61–1.24)
GFR, Estimated: >60 mL/min (ref >60)
Glucose, Bld: 248 mg/dL — ABNORMAL HIGH (ref 70–99)
Potassium: 4.4 mmol/L (ref 3.5–5.1)
Sodium: 137 mmol/L (ref 135–145)
Total Bilirubin: 0.9 mg/dL (ref 0.0–1.2)
Total Protein: 7.9 g/dL (ref 6.5–8.1)

## 2024-07-08 LAB — LACTATE DEHYDROGENASE: LDH: 124 U/L (ref 98–192)

## 2024-07-08 NOTE — Assessment & Plan Note (Addendum)
 We will follow-up lab.  Recent imaging without any obvious osseous lesions.  If no concerning findings, may follow-up in 1 year.

## 2024-07-08 NOTE — Progress Notes (Signed)
 [facility] OFFICE PROGRESS NOTE  Patient Care Team: [facility] as PCP - General Harden Jerona GAILS, MD as Consulting Physician (Orthopedic Surgery) Nichole Senior, MD (Endocrinology)  73 y.o.male with history of CAD, PCI, PAD, DM2, left AKA on 06/28/23, pT4a right arm melanoma referred to [facility] initially for elevated free kappa light chain.  Monoclonal protein was not identified on immunofixation.  IgA was slightly elevated.  Due to other medical issues, patient has not been able to follow-up for the last few months.  He had recurrent hospital presentation related to UTI, indwelling Foley, pressure ulcers, more recently with pulmonary embolism and currently on Eliquis .  No anemia today.  SPEP, free light chains and immunoglobulins are pending.  Hyperglycemia, recommend follow-up with PCP.  Clinically, patient is stable without new concerning symptoms.  He did not like the wheelchair that is causing him uncomfortable and wanted to leave as soon as possible.  We discussed relevant findings and follow-up plan.  They are also non-oncology related findings I addressed with the patient.  He has no new pulmonary symptoms, shortness of breath.  He has no GI bleeding, or abdominal pain.  We discussed follow-up with GI for evaluation as indicated, developing of bleeding, melena or GI symptoms. Anticoagulation itself does not cause bleeding but in the setting of traumatic bleeding will prolong bleeding.  We discussed referral to pulmonology for interstitial lung findings.  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  Diagnosis prior to adjuvant treatment was approved.  Clinically, his performance status is borderline and if he has interstitial lung disease, he would not be a candidate for immunotherapy in the future. Assessment & Plan Abnormal SPEP We will follow-up lab.  Recent  imaging without any obvious osseous lesions.  If no concerning findings, may follow-up in 1 year. Other acute pulmonary embolism without acute cor pulmonale (HCC) Patient has been started on apixaban  after admission in September.  Also has history of arterial thrombosis.  Continue apixaban  as tolerated. Discussed precautions on trauma, significant bleeding and ED evaluation immediately. Cutaneous melanoma (HCC) CBC, CMP, LDH were unremarkable today No recent imaging findings to suggest recurrence Lifelong follow up with dermatology for [facility] Now approaching 4 years. 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  Discussed imaging finding with patient and sister today.  Orders Placed This Encounter  Procedures   Ambulatory referral to Pulmonology    Referral Priority:   Routine    Referral Type:   Consultation    Referral Reason:   Specialty Services Required    Requested Specialty:   Pulmonary Disease    Number of Visits Requested:   1     Pauletta JAYSON Chihuahua, MD  INTERVAL HISTORY: Patient returns for follow-up.  Patient was previously seen for elevated kappa light chain and M protein spike.  This was reported to small to quantitate.  Immunofixation was negative for monoclonal protein.  He also a history of melanoma reported pT4 stage IIb diagnosed at the time that adjuvant therapy was not recommended. He has not follow-up with us  for a while due to other medical conditions and recurrent hospitalization.  Most recently he was hospitalized for right-sided pulmonary embolism.  Report he was bedbound.  He was started on Eliquis .  He was also found to have CT showed complete occlusion of the proximal left superficial femoral artery.  Per chart review, patient was on aspirin  at the time that admission and diagnosis of PE.  Patient is with his sister today.  He was not comfortable on the wheelchair and wanted to go as soon as possible.  He  has been taking Eliquis  without new bleeding.  He denies any other new symptoms.  We went over the assessment and plan.  Oncology History  Cutaneous melanoma (HCC)  10/10/2023 Initial Diagnosis   Cutaneous melanoma (HCC)   10/10/2023 Cancer Staging   Staging form: Melanoma of the Skin, AJCC 8th Edition - Clinical: Stage IIB (cT4a, cN0, cM0) - Signed by Tina Pauletta BROCKS, MD on 10/10/2023      PHYSICAL EXAMINATION: ECOG PERFORMANCE STATUS: 3 - Symptomatic, >50% confined to bed  Vitals:   07/08/24 1104  BP: 104/67  Pulse: 81  Resp: 18  Temp: (!) 97.4 F (36.3 C)  SpO2: 92%   There were no vitals filed for this visit.  GENERAL: alert, no distress and on wheelchair SKIN: skin color normal and no petechiae or jaundice on exposed skin LUNGS: clear to auscultation HEART: regular rate & rhythm  Musculoskeletal: left AKA   Relevant data reviewed during this visit included labs and recent imaging.

## 2024-07-08 NOTE — Assessment & Plan Note (Addendum)
 CBC, CMP, LDH were unremarkable today No recent imaging findings to suggest recurrence Lifelong follow up with dermatology for [facility] Now approaching 4 years. Follow-up in 1 year

## 2024-07-08 NOTE — Assessment & Plan Note (Addendum)
 Patient has been started on apixaban  after admission in September.  Also has history of arterial thrombosis.  Continue apixaban  as tolerated. Discussed precautions on trauma, significant bleeding and ED evaluation immediately.

## 2024-07-08 NOTE — Assessment & Plan Note (Addendum)
 Referral placed to pulmonology.  Discussed imaging finding with patient and sister today.

## 2024-07-09 LAB — KAPPA/LAMBDA LIGHT CHAINS
Kappa free light chain: 36.1 mg/L — ABNORMAL HIGH (ref 3.3–19.4)
Kappa, lambda light chain ratio: 1.67 — ABNORMAL HIGH (ref 0.26–1.65)
Lambda free light chains: 21.6 mg/L (ref 5.7–26.3)

## 2024-07-09 LAB — IGG, IGA, IGM
IgA: 581 mg/dL — ABNORMAL HIGH (ref 61–437)
IgG (Immunoglobin G), Serum: 881 mg/dL (ref 603–1613)
IgM (Immunoglobulin M), Srm: 27 mg/dL (ref 15–143)

## 2024-07-10 ENCOUNTER — Other Ambulatory Visit (HOSPITAL_COMMUNITY): Payer: Self-pay

## 2024-07-10 DIAGNOSIS — I2510 Atherosclerotic heart disease of native coronary artery without angina pectoris: Secondary | ICD-10-CM | POA: Diagnosis not present

## 2024-07-10 DIAGNOSIS — I110 Hypertensive heart disease with heart failure: Secondary | ICD-10-CM | POA: Diagnosis not present

## 2024-07-10 DIAGNOSIS — J439 Emphysema, unspecified: Secondary | ICD-10-CM | POA: Diagnosis not present

## 2024-07-10 DIAGNOSIS — I5032 Chronic diastolic (congestive) heart failure: Secondary | ICD-10-CM | POA: Diagnosis not present

## 2024-07-10 DIAGNOSIS — E1151 Type 2 diabetes mellitus with diabetic peripheral angiopathy without gangrene: Secondary | ICD-10-CM | POA: Diagnosis not present

## 2024-07-10 DIAGNOSIS — F419 Anxiety disorder, unspecified: Secondary | ICD-10-CM | POA: Diagnosis not present

## 2024-07-10 DIAGNOSIS — E1169 Type 2 diabetes mellitus with other specified complication: Secondary | ICD-10-CM | POA: Diagnosis not present

## 2024-07-10 DIAGNOSIS — E785 Hyperlipidemia, unspecified: Secondary | ICD-10-CM | POA: Diagnosis not present

## 2024-07-10 DIAGNOSIS — F0394 Unspecified dementia, unspecified severity, with anxiety: Secondary | ICD-10-CM | POA: Diagnosis not present

## 2024-07-10 MED ORDER — TAMSULOSIN HCL 0.4 MG PO CAPS
0.4000 mg | ORAL_CAPSULE | Freq: Every day | ORAL | 0 refills | Status: AC
  Filled 2024-07-10: qty 30, 30d supply, fill #0

## 2024-07-10 MED ORDER — LANTUS SOLOSTAR 100 UNIT/ML ~~LOC~~ SOPN
38.0000 [IU] | PEN_INJECTOR | Freq: Every day | SUBCUTANEOUS | 5 refills | Status: AC
  Filled 2024-07-10: qty 12, 31d supply, fill #0

## 2024-07-10 MED ORDER — ELIQUIS 5 MG PO TABS
5.0000 mg | ORAL_TABLET | Freq: Two times a day (BID) | ORAL | 2 refills | Status: AC
  Filled 2024-07-10: qty 60, 30d supply, fill #0
  Filled 2024-08-10: qty 60, 30d supply, fill #1

## 2024-07-10 MED ORDER — OXYBUTYNIN CHLORIDE 5 MG PO TABS
5.0000 mg | ORAL_TABLET | Freq: Two times a day (BID) | ORAL | 0 refills | Status: AC
  Filled 2024-07-10: qty 60, 30d supply, fill #0

## 2024-07-10 MED ORDER — GLIMEPIRIDE 2 MG PO TABS: 2.0000 mg | ORAL_TABLET | Freq: Every day | ORAL | 0 refills | Status: AC

## 2024-07-10 MED ORDER — NOVOLOG FLEXPEN 100 UNIT/ML ~~LOC~~ SOPN: 8.0000 [IU] | PEN_INJECTOR | Freq: Three times a day (TID) | SUBCUTANEOUS | 5 refills | Status: AC

## 2024-07-10 MED ORDER — FINASTERIDE 5 MG PO TABS
5.0000 mg | ORAL_TABLET | Freq: Every day | ORAL | 1 refills | Status: AC
  Filled 2024-07-10: qty 30, 30d supply, fill #0
  Filled 2024-08-10: qty 30, 30d supply, fill #1

## 2024-07-10 MED ORDER — ASPIRIN 81 MG PO CHEW
81.0000 mg | CHEWABLE_TABLET | Freq: Every day | ORAL | 2 refills | Status: AC
  Filled 2024-07-10 – 2024-08-10 (×2): qty 90, 90d supply, fill #0

## 2024-07-10 MED ORDER — METOPROLOL TARTRATE 25 MG PO TABS
25.0000 mg | ORAL_TABLET | Freq: Two times a day (BID) | ORAL | 1 refills | Status: AC
  Filled 2024-07-10: qty 60, 30d supply, fill #0
  Filled 2024-08-10: qty 60, 30d supply, fill #1

## 2024-07-11 LAB — PROTEIN ELECTROPHORESIS, SERUM, WITH REFLEX
A/G Ratio: 1.1 (ref 0.7–1.7)
Albumin ELP: 3.9 g/dL (ref 2.9–4.4)
Alpha-1-Globulin: 0.3 g/dL (ref 0.0–0.4)
Alpha-2-Globulin: 1 g/dL (ref 0.4–1.0)
Beta Globulin: 1.5 g/dL — ABNORMAL HIGH (ref 0.7–1.3)
Gamma Globulin: 0.7 g/dL (ref 0.4–1.8)
Globulin, Total: 3.5 g/dL (ref 2.2–3.9)
Total Protein ELP: 7.4 g/dL (ref 6.0–8.5)

## 2024-07-13 LAB — IMMUNOFIXATION ELECTROPHORESIS
IgA: 584 mg/dL — ABNORMAL HIGH (ref 61–437)
IgG (Immunoglobin G), Serum: 953 mg/dL (ref 603–1613)
IgM (Immunoglobulin M), Srm: 31 mg/dL (ref 15–143)
Total Protein ELP: 7.1 g/dL (ref 6.0–8.5)

## 2024-07-14 ENCOUNTER — Ambulatory Visit: Payer: Self-pay

## 2024-07-15 NOTE — Telephone Encounter (Signed)
 LVM for the patient with call back number. Message from Dr Tina read to the patient. Encouraged a return call if any questions or concerns. Arland Legions BSN RN

## 2024-07-15 NOTE — Telephone Encounter (Signed)
-----   Message from Pauletta JAYSON Chihuahua sent at 07/14/2024  3:08 PM EDT ----- Arland please let him know, no new concerning change from labs last week.  Blood sugar is higher than expected.  Continue follow-up with PCP for management of his diabetes.  Follow-up with us  in 1 year, earlier if any new concerns.  Thank you. ----- Message ----- From: Rebecka, [facility] [place] Sent: 07/08/2024  10:39 AM EDT To: Pauletta JAYSON Chihuahua, MD

## 2024-07-16 DIAGNOSIS — I5032 Chronic diastolic (congestive) heart failure: Secondary | ICD-10-CM | POA: Diagnosis not present

## 2024-07-22 ENCOUNTER — Other Ambulatory Visit (HOSPITAL_COMMUNITY): Payer: Self-pay

## 2024-07-30 ENCOUNTER — Ambulatory Visit: Admitting: Nurse Practitioner

## 2024-07-30 NOTE — Progress Notes (Deleted)
 07/30/2024 Micheal Moore [id number] 1951-01-28   CHIEF COMPLAINT: Esophagitis  HISTORY OF PRESENT ILLNESS: Micheal Moore. Micheal Moore is a 73 year old male with a past medical history of coronary artery disease, diabetes mellitus type 2, RLE DVT/PE, osteomyelitis,  He was admitted to the hospital 9/10 - 05/28/2024 with N/V and dysphagia.   On Eliquis  and ASA 81 mg daily.     Latest Ref Rng & Units 07/08/2024   10:07 AM 05/28/2024    3:54 AM 05/27/2024    6:12 AM  CBC  WBC 4.0 - 10.5 K/uL 7.3  8.4  10.6   Hemoglobin 13.0 - 17.0 g/dL 85.8  88.0  86.9   Hematocrit 39.0 - 52.0 % 42.3  36.0  38.9   Platelets 150 - 400 K/uL 249  206  217       Past Medical History:  Diagnosis Date   BPH (benign prostatic hyperplasia)    CAD (coronary artery disease)    Cancer (HCC)    right elbow melanoma   Cellulitis in diabetic foot (HCC) 04/27/2020   Diabetes mellitus without complication (HCC)    Type II   Diabetic ketoacidosis without coma associated with type 2 diabetes mellitus (HCC) 01/15/2023   Dizziness    DVT (deep venous thrombosis) (HCC)    right leg   Elevated troponin 04/11/2023   Fall    Frequent falls    Hip fracture (HCC) 02/22/2022   Hypokalemia 04/11/2023   Near syncope    Osteomyelitis (HCC) 06/19/2023   Osteomyelitis of great toe of left foot (HCC) 04/27/2020   Pulmonary embolism (HCC)    Syncope    Past Surgical History:  Procedure Laterality Date   AMPUTATION Left 06/28/2023   Procedure: LEFT ABOVE KNEE AMPUTATION;  Surgeon: Harden Jerona GAILS, MD;  Location: [facility] OR;  Service: Orthopedics;  Laterality: Left;   AMPUTATION TOE Left 04/28/2020   Procedure: AMPUTATION LEFT GREAT TOE;  Surgeon: Harden Jerona GAILS, MD;  Location: [facility] OR;  Service: Orthopedics;  Laterality: Left;   CORONARY/GRAFT ACUTE MI REVASCULARIZATION N/A 01/15/2023   Procedure: Coronary/Graft Acute MI Revascularization;  Surgeon: Wendel Lurena POUR, MD;  Location: [facility] IN[facility]AS[facility];   Service: Cardiovascular;  Laterality: N/A;   INTRAMEDULLARY (IM) NAIL INTERTROCHANTERIC Right 02/23/2022   Procedure: INTRAMEDULLARY (IM) NAIL INTERTROCHANTRIC, RIGHT;  Surgeon: Fidel Rogue, MD;  Location: [facility] ORS;  Service: Orthopedics;  Laterality: Right;   LEFT HEART CATH AND CORONARY ANGIOGRAPHY N/A 01/15/2023   Procedure: LEFT HEART CATH AND CORONARY ANGIOGRAPHY;  Surgeon: Wendel Lurena POUR, MD;  Location: [facility];  Service: Cardiovascular;  Laterality: N/A;   LOWER EXTREMITY ANGIOGRAPHY Bilateral 01/18/2023   Procedure: Lower Extremity Angiography;  Surgeon: Serene Gaile ORN, MD;  Location: [facility];  Service: Cardiovascular;  Laterality: Bilateral;   MELANOMA EXCISION WITH SENTINEL LYMPH NODE BIOPSY Right 08/19/2020   Procedure: WIDE LOCAL EXCISION RIGHT ELBOW MELANOMA WITH RIGHT AXILLARY SENTINEL LYMPH NODE BIOPSY;  Surgeon: Dasie Leonor CROME, MD;  Location: [facility] OR;  Service: General;  Laterality: Right;  2nd incision in axilla   TEE WITHOUT CARDIOVERSION N/A 04/19/2023   Procedure: TRANSESOPHAGEAL ECHOCARDIOGRAM;  Surgeon: Kate Lonni CROME, MD;  Location: [facility] INVASIVE CV LAB;  Service: Cardiovascular;  Laterality: N/A;   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   Procedure: TOTAL HIP ARTHROPLASTY ANTERIOR APPROACH;  Surgeon: Fidel Rogue, MD;  Location: [facility] ORS;  Service: Orthopedics;  Laterality: Left;   TOTAL HIP ARTHROPLASTY Left 08/24/2022   Procedure:  OPEN REDUCTION, HEAD AND LINER EXCHANGE;  Surgeon: Fidel Rogue, MD;  Location: [facility] OR[facility];  Service: Orthopedics;  Laterality: Left;   Social History:  Family History:    reports that he has been smoking cigarettes. He has a 3.8 pack-year smoking history. He has never used smokeless tobacco. He reports that he does not currently use drugs. He reports that he does not drink alcohol . family history includes Diabetes in his father and sister; Stroke in his mother and sister. No Known Allergies    Outpatient  Encounter Medications as of 07/30/2024  Medication Sig   alum & mag hydroxide-simeth (MAALOX PLUS) 400-400-40 MG/5ML suspension Take 7.5 mLs by mouth every 4 (four) hours as needed for indigestion.   apixaban  (ELIQUIS ) 5 MG TABS tablet Take 1 tablet (5 mg total) by mouth 2 (two) times daily.   ascorbic acid  (VITAMIN C ) 500 MG tablet Take 1 tablet (500 mg total) by mouth daily.   aspirin  81 MG chewable tablet Chew 1 tablet (81 mg total) by mouth daily.   aspirin  EC 81 MG tablet Take 1 tablet (81 mg total) by mouth daily. Swallow whole.   cyanocobalamin  1000 MCG tablet Take 1 tablet (1,000 mcg total) by mouth daily.   finasteride  (PROSCAR ) 5 MG tablet Take 1 tablet (5 mg total) by mouth daily.   glimepiride  (AMARYL ) 2 MG tablet Take 1 tablet (2 mg total) by mouth daily.   glimepiride  (AMARYL ) 2 MG tablet Take 1 tablet (2 mg total) by mouth daily.   guaiFENesin  (MUCINEX ) 600 MG 12 hr tablet Take 1 tablet (600 mg total) by mouth 2 (two) times daily.   hydrocerin (EUCERIN) CREA Apply 1 Application topically 2 (two) times daily.   hydrOXYzine  (ATARAX ) 25 MG tablet Take 1 tablet (25 mg total) by mouth every 8 (eight) hours as needed for itching.   insulin  aspart (NOVOLOG  FLEXPEN) 100 UNIT/ML FlexPen Inject 8 Units into the skin 3 (three) times daily with meals.   insulin  glargine (LANTUS  SOLOSTAR) 100 UNIT/ML Solostar Pen Inject 38 Units into the skin at bedtime.   Insulin  Pen Needle 32G X 4 MM MISC Use as directed.   iron  polysaccharides (NIFEREX) 150 MG capsule Take 1 capsule (150 mg total) by mouth daily.   lidocaine  (XYLOCAINE ) 2 % solution Use as directed 15 mLs in the mouth or throat as needed for mouth pain.   metoprolol  tartrate (LOPRESSOR ) 25 MG tablet Take 1 tablet (25 mg total) by mouth 2 (two) times daily.   Nystatin  (GERHARDT'S BUTT CREAM) CREA Apply 1 Application topically 3 (three) times daily.   ondansetron  (ZOFRAN -ODT) 4 MG disintegrating tablet Take 1 tablet (4 mg total) by mouth  every 6 (six) hours as needed.   [Paused] oxybutynin  (DITROPAN ) 5 MG tablet Take 1 tablet (5 mg total) by mouth every 8 (eight) hours as needed for bladder spasms. (Patient taking differently: Take 5 mg by mouth 2 (two) times daily.)   oxybutynin  (DITROPAN ) 5 MG tablet Take 1 tablet (5 mg total) by mouth 2 (two) times daily.   pantoprazole  (PROTONIX ) 40 MG tablet Take 1 tablet (40 mg total) by mouth 2 (two) times daily.   polyethylene glycol powder (GLYCOLAX /MIRALAX ) 17 GM/SCOOP powder Take 17 g mixed with water  by mouth daily as needed for mild constipation.   rosuvastatin  (CRESTOR ) 20 MG tablet Take 1 tablet (20 mg total) by mouth daily.   senna-docusate (SENOKOT-S) 8.6-50 MG tablet Take 2 tablets by mouth at bedtime as needed for mild constipation.   tamsulosin  (  FLOMAX ) 0.4 MG CAPS capsule Take 1 capsule (0.4 mg total) by mouth daily after supper.   tamsulosin  (FLOMAX ) 0.4 MG CAPS capsule Take 1 capsule (0.4 mg total) by mouth daily.   Vitamin D , Ergocalciferol , (DRISDOL ) 1.25 MG (50000 UNIT) CAPS capsule Take 1 capsule (50,000 Units total) by mouth every 7 (seven) days.   No facility-administered encounter medications on file as of 07/30/2024.     REVIEW OF SYSTEMS:  Gen: Denies fever, sweats or chills. No weight loss.  CV: Denies chest pain, palpitations or edema. Resp: Denies cough, shortness of breath of hemoptysis.  GI: Denies heartburn, dysphagia, stomach or lower abdominal pain. No diarrhea or constipation.  GU: Denies urinary burning, blood in urine, increased urinary frequency or incontinence. MS: Denies joint pain, muscles aches or weakness. Derm: Denies rash, itchiness, skin lesions or unhealing ulcers. Psych: Denies depression, anxiety, memory loss or confusion. Heme: Denies bruising, easy bleeding. Neuro:  Denies headaches, dizziness or paresthesias. Endo:  Denies any problems with DM, thyroid  or adrenal function.  PHYSICAL EXAM: There were no vitals taken for this  visit. General: in no acute distress. Head: Normocephalic and atraumatic. Eyes:  Sclerae non-icteric, conjunctive pink. Ears: Normal auditory acuity. Mouth: Dentition intact. No ulcers or lesions.  Neck: Supple, no lymphadenopathy or thyromegaly.  Lungs: Clear bilaterally to auscultation without wheezes, crackles or rhonchi. Heart: Regular rate and rhythm. No murmur, rub or gallop appreciated.  Abdomen: Soft, nontender, nondistended. No masses. No hepatosplenomegaly. Normoactive bowel sounds x 4 quadrants.  Rectal:  Musculoskeletal: Symmetrical with no gross deformities. Skin: Warm and dry. No rash or lesions on visible extremities. Extremities: No edema. Neurological: Alert oriented x 4, no focal deficits.  Psychological: Alert and cooperative. Normal mood and affect.  ASSESSMENT AND PLAN:    CC:  Financial Risk Analyst, [employer]

## 2024-08-07 DIAGNOSIS — E1151 Type 2 diabetes mellitus with diabetic peripheral angiopathy without gangrene: Secondary | ICD-10-CM | POA: Diagnosis not present

## 2024-08-07 DIAGNOSIS — I5032 Chronic diastolic (congestive) heart failure: Secondary | ICD-10-CM | POA: Diagnosis not present

## 2024-08-07 DIAGNOSIS — F419 Anxiety disorder, unspecified: Secondary | ICD-10-CM | POA: Diagnosis not present

## 2024-08-07 DIAGNOSIS — J439 Emphysema, unspecified: Secondary | ICD-10-CM | POA: Diagnosis not present

## 2024-08-07 DIAGNOSIS — I2510 Atherosclerotic heart disease of native coronary artery without angina pectoris: Secondary | ICD-10-CM | POA: Diagnosis not present

## 2024-08-07 DIAGNOSIS — F0394 Unspecified dementia, unspecified severity, with anxiety: Secondary | ICD-10-CM | POA: Diagnosis not present

## 2024-08-07 DIAGNOSIS — E785 Hyperlipidemia, unspecified: Secondary | ICD-10-CM | POA: Diagnosis not present

## 2024-08-07 DIAGNOSIS — I110 Hypertensive heart disease with heart failure: Secondary | ICD-10-CM | POA: Diagnosis not present

## 2024-08-08 DIAGNOSIS — E1169 Type 2 diabetes mellitus with other specified complication: Secondary | ICD-10-CM | POA: Diagnosis not present

## 2024-08-08 DIAGNOSIS — F419 Anxiety disorder, unspecified: Secondary | ICD-10-CM | POA: Diagnosis not present

## 2024-08-08 DIAGNOSIS — I5032 Chronic diastolic (congestive) heart failure: Secondary | ICD-10-CM | POA: Diagnosis not present

## 2024-08-08 DIAGNOSIS — I2510 Atherosclerotic heart disease of native coronary artery without angina pectoris: Secondary | ICD-10-CM | POA: Diagnosis not present

## 2024-08-08 DIAGNOSIS — I110 Hypertensive heart disease with heart failure: Secondary | ICD-10-CM | POA: Diagnosis not present

## 2024-08-08 DIAGNOSIS — J439 Emphysema, unspecified: Secondary | ICD-10-CM | POA: Diagnosis not present

## 2024-08-08 DIAGNOSIS — E785 Hyperlipidemia, unspecified: Secondary | ICD-10-CM | POA: Diagnosis not present

## 2024-08-08 DIAGNOSIS — F0394 Unspecified dementia, unspecified severity, with anxiety: Secondary | ICD-10-CM | POA: Diagnosis not present

## 2024-08-08 DIAGNOSIS — E1151 Type 2 diabetes mellitus with diabetic peripheral angiopathy without gangrene: Secondary | ICD-10-CM | POA: Diagnosis not present

## 2024-08-10 ENCOUNTER — Other Ambulatory Visit (HOSPITAL_COMMUNITY): Payer: Self-pay

## 2024-08-10 MED ORDER — ROSUVASTATIN CALCIUM 20 MG PO TABS
20.0000 mg | ORAL_TABLET | Freq: Every day | ORAL | 0 refills | Status: AC
Start: 2024-08-10 — End: ?
  Filled 2024-08-10: qty 30, 30d supply, fill #0

## 2024-08-10 MED ORDER — POLYSACCHARIDE IRON COMPLEX 150 MG PO CAPS
150.0000 mg | ORAL_CAPSULE | Freq: Every day | ORAL | 0 refills | Status: AC
Start: 2024-08-10 — End: ?
  Filled 2024-08-10: qty 30, 30d supply, fill #0

## 2024-08-10 MED ORDER — PANTOPRAZOLE SODIUM 40 MG PO TBEC
40.0000 mg | DELAYED_RELEASE_TABLET | Freq: Two times a day (BID) | ORAL | 0 refills | Status: AC
Start: 2024-08-10 — End: ?
  Filled 2024-08-10: qty 60, 30d supply, fill #0

## 2024-09-02 ENCOUNTER — Other Ambulatory Visit (HOSPITAL_COMMUNITY): Payer: Self-pay

## 2024-09-02 MED ORDER — ROSUVASTATIN CALCIUM 40 MG PO TABS
40.0000 mg | ORAL_TABLET | Freq: Every day | ORAL | 1 refills | Status: AC
  Filled 2024-09-02: qty 30, 30d supply, fill #0

## 2024-09-14 ENCOUNTER — Other Ambulatory Visit (HOSPITAL_COMMUNITY): Payer: Self-pay
